# Patient Record
Sex: Female | Born: 1945
Health system: Southern US, Community
[De-identification: ages and names within clinical notes are randomized; demographics above are authoritative.]

## PROBLEM LIST (undated history)

## (undated) DIAGNOSIS — F419 Anxiety disorder, unspecified: Secondary | ICD-10-CM

## (undated) DIAGNOSIS — E785 Hyperlipidemia, unspecified: Secondary | ICD-10-CM

## (undated) DIAGNOSIS — I219 Acute myocardial infarction, unspecified: Secondary | ICD-10-CM

## (undated) DIAGNOSIS — I2510 Atherosclerotic heart disease of native coronary artery without angina pectoris: Secondary | ICD-10-CM

## (undated) DIAGNOSIS — Z923 Personal history of irradiation: Secondary | ICD-10-CM

## (undated) DIAGNOSIS — M1990 Unspecified osteoarthritis, unspecified site: Secondary | ICD-10-CM

## (undated) DIAGNOSIS — G2581 Restless legs syndrome: Secondary | ICD-10-CM

## (undated) DIAGNOSIS — J42 Unspecified chronic bronchitis: Secondary | ICD-10-CM

## (undated) DIAGNOSIS — I10 Essential (primary) hypertension: Secondary | ICD-10-CM

## (undated) DIAGNOSIS — C801 Malignant (primary) neoplasm, unspecified: Secondary | ICD-10-CM

## (undated) DIAGNOSIS — C50919 Malignant neoplasm of unspecified site of unspecified female breast: Secondary | ICD-10-CM

## (undated) DIAGNOSIS — I639 Cerebral infarction, unspecified: Secondary | ICD-10-CM

## (undated) HISTORY — PX: TUBAL LIGATION: SHX77

## (undated) HISTORY — DX: Atherosclerotic heart disease of native coronary artery without angina pectoris: I25.10

## (undated) HISTORY — PX: SMALL INTESTINE SURGERY: SHX150

## (undated) HISTORY — PX: UPPER GASTROINTESTINAL ENDOSCOPY: SHX188

## (undated) HISTORY — DX: Hyperlipidemia, unspecified: E78.5

## (undated) HISTORY — DX: Malignant (primary) neoplasm, unspecified: C80.1

## (undated) HISTORY — DX: Unspecified chronic bronchitis: J42

## (undated) HISTORY — DX: Essential (primary) hypertension: I10

## (undated) HISTORY — DX: Acute myocardial infarction, unspecified: I21.9

## (undated) HISTORY — DX: Cerebral infarction, unspecified: I63.9

## (undated) HISTORY — DX: Unspecified osteoarthritis, unspecified site: M19.90

---

## 1983-07-30 HISTORY — PX: ABDOMINAL HYSTERECTOMY: SHX81

## 1998-07-29 DIAGNOSIS — C801 Malignant (primary) neoplasm, unspecified: Secondary | ICD-10-CM

## 1998-07-29 HISTORY — PX: BREAST LUMPECTOMY: SHX2

## 1998-07-29 HISTORY — DX: Malignant (primary) neoplasm, unspecified: C80.1

## 1999-07-30 DIAGNOSIS — I219 Acute myocardial infarction, unspecified: Secondary | ICD-10-CM

## 1999-07-30 HISTORY — PX: BREAST SURGERY: SHX581

## 1999-07-30 HISTORY — DX: Acute myocardial infarction, unspecified: I21.9

## 2002-07-29 DIAGNOSIS — I639 Cerebral infarction, unspecified: Secondary | ICD-10-CM

## 2002-07-29 HISTORY — DX: Cerebral infarction, unspecified: I63.9

## 2007-04-07 ENCOUNTER — Emergency Department (HOSPITAL_COMMUNITY): Admission: EM | Admit: 2007-04-07 | Discharge: 2007-04-07 | Payer: Self-pay | Admitting: Emergency Medicine

## 2007-04-08 ENCOUNTER — Ambulatory Visit: Payer: Self-pay | Admitting: *Deleted

## 2007-04-09 ENCOUNTER — Emergency Department (HOSPITAL_COMMUNITY): Admission: EM | Admit: 2007-04-09 | Discharge: 2007-04-09 | Payer: Self-pay | Admitting: Emergency Medicine

## 2007-04-10 ENCOUNTER — Encounter (INDEPENDENT_AMBULATORY_CARE_PROVIDER_SITE_OTHER): Payer: Self-pay | Admitting: Nurse Practitioner

## 2007-04-10 ENCOUNTER — Ambulatory Visit: Payer: Self-pay | Admitting: Internal Medicine

## 2007-04-10 LAB — CONVERTED CEMR LAB
HDL: 60 mg/dL (ref >39)
LDL Cholesterol: 175 mg/dL — ABNORMAL HIGH (ref 0–99)
Total CHOL/HDL Ratio: 4.3
Triglycerides: 123 mg/dL (ref <150)

## 2007-04-27 ENCOUNTER — Ambulatory Visit: Payer: Self-pay | Admitting: Internal Medicine

## 2007-05-26 ENCOUNTER — Ambulatory Visit: Payer: Self-pay | Admitting: Internal Medicine

## 2007-05-26 LAB — CONVERTED CEMR LAB
HDL: 46 mg/dL (ref >39)
LDL Cholesterol: 85 mg/dL (ref 0–99)
Total CHOL/HDL Ratio: 3.2

## 2007-06-09 ENCOUNTER — Ambulatory Visit: Payer: Self-pay | Admitting: Internal Medicine

## 2007-06-11 ENCOUNTER — Encounter: Admission: RE | Admit: 2007-06-11 | Discharge: 2007-06-11 | Payer: Self-pay | Admitting: Family Medicine

## 2007-08-31 ENCOUNTER — Ambulatory Visit: Payer: Self-pay | Admitting: Internal Medicine

## 2007-10-29 DIAGNOSIS — I890 Lymphedema, not elsewhere classified: Secondary | ICD-10-CM | POA: Insufficient documentation

## 2008-12-20 ENCOUNTER — Encounter: Admission: RE | Admit: 2008-12-20 | Discharge: 2008-12-20 | Payer: Self-pay | Admitting: Family Medicine

## 2009-08-24 DIAGNOSIS — J42 Unspecified chronic bronchitis: Secondary | ICD-10-CM | POA: Insufficient documentation

## 2010-02-20 DIAGNOSIS — E559 Vitamin D deficiency, unspecified: Secondary | ICD-10-CM | POA: Insufficient documentation

## 2010-02-26 ENCOUNTER — Encounter: Admission: RE | Admit: 2010-02-26 | Discharge: 2010-02-26 | Payer: Self-pay | Admitting: Family Medicine

## 2010-05-01 DIAGNOSIS — M899 Disorder of bone, unspecified: Secondary | ICD-10-CM | POA: Insufficient documentation

## 2010-05-01 DIAGNOSIS — F411 Generalized anxiety disorder: Secondary | ICD-10-CM | POA: Insufficient documentation

## 2010-08-03 ENCOUNTER — Emergency Department (HOSPITAL_COMMUNITY)
Admission: EM | Admit: 2010-08-03 | Discharge: 2010-08-03 | Payer: Self-pay | Source: Home / Self Care | Admitting: Emergency Medicine

## 2010-10-19 DIAGNOSIS — IMO0002 Reserved for concepts with insufficient information to code with codable children: Secondary | ICD-10-CM | POA: Insufficient documentation

## 2011-01-21 ENCOUNTER — Other Ambulatory Visit: Payer: Self-pay | Admitting: Family Medicine

## 2011-01-22 ENCOUNTER — Ambulatory Visit
Admission: RE | Admit: 2011-01-22 | Discharge: 2011-01-22 | Disposition: A | Discharge status: Home / Self Care | Payer: Medicare Other | Source: Ambulatory Visit | Attending: Family Medicine | Admitting: Family Medicine

## 2011-01-22 ENCOUNTER — Other Ambulatory Visit: Payer: Self-pay | Admitting: Family Medicine

## 2011-03-01 ENCOUNTER — Other Ambulatory Visit: Payer: Self-pay | Admitting: Family Medicine

## 2011-03-01 DIAGNOSIS — Z780 Asymptomatic menopausal state: Secondary | ICD-10-CM

## 2011-03-01 DIAGNOSIS — Z1231 Encounter for screening mammogram for malignant neoplasm of breast: Secondary | ICD-10-CM

## 2011-04-04 ENCOUNTER — Ambulatory Visit
Admission: RE | Admit: 2011-04-04 | Discharge: 2011-04-04 | Disposition: A | Discharge status: Home / Self Care | Payer: Medicare Other | Source: Ambulatory Visit | Attending: Family Medicine | Admitting: Family Medicine

## 2011-04-04 DIAGNOSIS — Z1231 Encounter for screening mammogram for malignant neoplasm of breast: Secondary | ICD-10-CM

## 2011-04-04 DIAGNOSIS — Z8739 Personal history of other diseases of the musculoskeletal system and connective tissue: Secondary | ICD-10-CM

## 2011-04-04 DIAGNOSIS — Z780 Asymptomatic menopausal state: Secondary | ICD-10-CM

## 2011-04-22 ENCOUNTER — Emergency Department (HOSPITAL_COMMUNITY)
Admission: EM | Admit: 2011-04-22 | Discharge: 2011-04-22 | Disposition: A | Discharge status: Home / Self Care | Payer: Medicare Other | Attending: Emergency Medicine | Admitting: Emergency Medicine

## 2011-04-22 DIAGNOSIS — I10 Essential (primary) hypertension: Secondary | ICD-10-CM | POA: Insufficient documentation

## 2011-04-22 DIAGNOSIS — R040 Epistaxis: Secondary | ICD-10-CM | POA: Insufficient documentation

## 2011-04-22 DIAGNOSIS — Z853 Personal history of malignant neoplasm of breast: Secondary | ICD-10-CM | POA: Insufficient documentation

## 2011-05-10 LAB — I-STAT 8, (EC8 V) (CONVERTED LAB)
Bicarbonate: 28.3 — ABNORMAL HIGH
Glucose, Bld: 96
Operator id: 239701
pH, Ven: 7.464 — ABNORMAL HIGH

## 2011-05-10 LAB — POCT I-STAT CREATININE
Creatinine, Ser: 0.9
Operator id: 239701

## 2011-07-05 DIAGNOSIS — T7840XA Allergy, unspecified, initial encounter: Secondary | ICD-10-CM | POA: Insufficient documentation

## 2011-07-24 DIAGNOSIS — G459 Transient cerebral ischemic attack, unspecified: Secondary | ICD-10-CM | POA: Insufficient documentation

## 2011-07-30 LAB — HM COLONOSCOPY: HM Colonoscopy: NORMAL

## 2011-10-10 DIAGNOSIS — M25559 Pain in unspecified hip: Secondary | ICD-10-CM | POA: Insufficient documentation

## 2011-12-02 ENCOUNTER — Other Ambulatory Visit (HOSPITAL_COMMUNITY): Payer: Self-pay | Admitting: Family Medicine

## 2011-12-02 DIAGNOSIS — Z853 Personal history of malignant neoplasm of breast: Secondary | ICD-10-CM

## 2011-12-05 ENCOUNTER — Encounter (HOSPITAL_COMMUNITY)
Admission: RE | Admit: 2011-12-05 | Discharge: 2011-12-05 | Disposition: A | Discharge status: Home / Self Care | Payer: Medicare Other | Source: Ambulatory Visit | Attending: Family Medicine | Admitting: Family Medicine

## 2011-12-05 DIAGNOSIS — Z853 Personal history of malignant neoplasm of breast: Secondary | ICD-10-CM | POA: Insufficient documentation

## 2011-12-05 DIAGNOSIS — M25559 Pain in unspecified hip: Secondary | ICD-10-CM | POA: Insufficient documentation

## 2011-12-05 MED ORDER — TECHNETIUM TC 99M MEDRONATE IV KIT
25.0000 | PACK | Freq: Once | INTRAVENOUS | Status: AC | PRN
  Administered 2011-12-05: 25 via INTRAVENOUS

## 2012-02-03 ENCOUNTER — Encounter: Payer: Self-pay | Admitting: Family Medicine

## 2012-02-03 ENCOUNTER — Ambulatory Visit (INDEPENDENT_AMBULATORY_CARE_PROVIDER_SITE_OTHER): Payer: Medicare Other | Admitting: Family Medicine

## 2012-02-03 VITALS — BP 130/70 | HR 85 | Temp 98.6°F | Ht 60.75 in | Wt 193.6 lb

## 2012-02-03 DIAGNOSIS — C50919 Malignant neoplasm of unspecified site of unspecified female breast: Secondary | ICD-10-CM

## 2012-02-03 DIAGNOSIS — R1032 Left lower quadrant pain: Secondary | ICD-10-CM | POA: Insufficient documentation

## 2012-02-03 DIAGNOSIS — I10 Essential (primary) hypertension: Secondary | ICD-10-CM

## 2012-02-03 DIAGNOSIS — H8110 Benign paroxysmal vertigo, unspecified ear: Secondary | ICD-10-CM

## 2012-02-03 MED ORDER — NAPROXEN 500 MG PO TABS: 500.0000 mg | ORAL_TABLET | Freq: Two times a day (BID) | ORAL | Status: AC

## 2012-02-03 MED ORDER — MONTELUKAST SODIUM 10 MG PO TABS: 10.0000 mg | ORAL_TABLET | Freq: Every day | ORAL | Status: DC

## 2012-02-03 MED ORDER — MECLIZINE HCL 50 MG PO TABS: 50.0000 mg | ORAL_TABLET | Freq: Three times a day (TID) | ORAL | Status: AC | PRN

## 2012-02-03 NOTE — Patient Instructions (Addendum)
Schedule your complete physical at your convenience Start the Meclizine as needed for dizziness Change positions slowly Increase your fluid intake HOLD the Celebrex while taking the Naproxen.  Take the Naproxen (500mg ) twice daily- w/ food- for 10 days. We'll call you with your ortho appt Continue the Singulair and Claritin daily Add Coricidin HBP to decrease the congestion Call with any questions or concerns Hang in there! Welcome!  We're glad to have you!

## 2012-02-03 NOTE — Progress Notes (Signed)
  Subjective:    Patient ID: Tracy [name], female    DOB: 1946/04/12, 66 y.o.   MRN: [id number]  HPI New to establish.  Previous MD- Duanne Guess.  HTN- chronic problem, on Benicar 20mg .  No CP, SOB, visual changes, edema.  Breast Cancer- dx'd 2001, s/p lumpectomy.  Getting yearly mammograms.  No longer following w/ surgeon or oncologist.  Was following yearly until moved from [place] in 2005.  Dizziness- hx of vertigo.  Feels this is 'different.  i don't have the spinning'.  Will wake her from sleeping when she turns over.  sxs started Friday w/ getting out of bed.  Worse when changing position- bending, turning, standing.  No nausea.  No current HA.  Having L ear pain and R ear fullness.  L groin pain- sxs started in Jan.  'right there- there's a spot'.  When crossing her L leg over right in 'tailor' motion she will get sharp shooting pain.  Has had xray, MRI, CT scan of abd/pelvis.  Pt has hx of similar and was told it was tendonitis.  Was refused meds for tendonitis and told to do exercises.  'i think she got real mad at me'.  Previously on Celebrex- has resumed 100mg  daily.   Review of Systems For ROS see HPI     Objective:   Physical Exam  Vitals reviewed. Constitutional: She is oriented to person, place, and time. She appears well-developed and well-nourished. No distress.  HENT:  Head: Normocephalic and atraumatic.  Right Ear: Tympanic membrane normal.  Left Ear: Tympanic membrane normal.  Nose: Mucosal edema and rhinorrhea present. Right sinus exhibits no maxillary sinus tenderness and no frontal sinus tenderness. Left sinus exhibits no maxillary sinus tenderness and no frontal sinus tenderness.  Mouth/Throat: Mucous membranes are normal. Posterior oropharyngeal erythema (w/ PND) present.  Eyes: Conjunctivae and EOM are normal. Pupils are equal, round, and reactive to light.  Neck: Normal range of motion. Neck supple. No thyromegaly present.  Cardiovascular: Normal rate, regular rhythm,  normal heart sounds and intact distal pulses.   No murmur heard. Pulmonary/Chest: Effort normal and breath sounds normal. No respiratory distress. She has no wheezes. She has no rales.  Abdominal: Soft. She exhibits no distension. There is no tenderness.  Musculoskeletal: She exhibits no edema.       Pain w/ external rotation and abduction of L hip over origin of sartorious.  Lymphadenopathy:    She has no cervical adenopathy.  Neurological: She is alert and oriented to person, place, and time. She has normal reflexes. No cranial nerve deficit. Coordination normal.       Gait normal  Skin: Skin is warm and dry.  Psychiatric: She has a normal mood and affect. Her behavior is normal.          Assessment & Plan:

## 2012-02-18 NOTE — Assessment & Plan Note (Signed)
New to provider.  Pt getting yearly mammos.  No longer following w/ surgeon.

## 2012-02-18 NOTE — Assessment & Plan Note (Signed)
Chronic problem.  Fair control.  Asymptomatic.  No changes. 

## 2012-02-18 NOTE — Assessment & Plan Note (Signed)
New.  Pt w/ hx of similar.  Start Meclizine prn.  Increase fluid intake.  Continue seasonal allergy meds to decrease sinus congestion.  Add Coricidin HBP prn.  Reviewed supportive care and red flags that should prompt return.  Pt expressed understanding and is in agreement w/ plan.

## 2012-02-18 NOTE — Assessment & Plan Note (Signed)
New.  Pt's sxs consistent w/ tendonitis.  Will start scheduled NSAIDs and refer to ortho for complete evaluation and tx.  Pt expressed understanding and is in agreement w/ plan.

## 2012-02-19 ENCOUNTER — Ambulatory Visit (INDEPENDENT_AMBULATORY_CARE_PROVIDER_SITE_OTHER): Payer: Medicare Other | Admitting: Family Medicine

## 2012-02-19 ENCOUNTER — Telehealth: Payer: Self-pay | Admitting: *Deleted

## 2012-02-19 ENCOUNTER — Encounter: Payer: Self-pay | Admitting: Family Medicine

## 2012-02-19 VITALS — BP 135/86 | HR 74 | Temp 99.1°F | Ht 60.0 in | Wt 193.8 lb

## 2012-02-19 DIAGNOSIS — H8110 Benign paroxysmal vertigo, unspecified ear: Secondary | ICD-10-CM

## 2012-02-19 DIAGNOSIS — I10 Essential (primary) hypertension: Secondary | ICD-10-CM

## 2012-02-19 DIAGNOSIS — J019 Acute sinusitis, unspecified: Secondary | ICD-10-CM | POA: Insufficient documentation

## 2012-02-19 MED ORDER — SULFAMETHOXAZOLE-TMP DS 800-160 MG PO TABS: 1.0000 | ORAL_TABLET | Freq: Two times a day (BID) | ORAL | Status: DC

## 2012-02-19 MED ORDER — MOMETASONE FUROATE 50 MCG/ACT NA SUSP: 2.0000 | Freq: Every day | NASAL | Status: DC

## 2012-02-19 NOTE — Progress Notes (Signed)
  Subjective:    Patient ID: Tracy [name], female    DOB: Feb 10, 1946, 66 y.o.   MRN: [id number]  HPI Elevated BP- went to get injxn in hip yesterday at ortho and BP was 167/84.  Today BP is closer to norm.  Denies CP, SOB, HAs, visual changes.  Dizziness- pt reports dizziness was improving from last visit but on Saturday bent down and 'got real, real dizzy and it started all over again'.  sxs are worse w/ lying down and turning.  Used nasonex on Saturday w/ some relief but is now out of med.  Having some eye watering, sinus HA.  Denies facial pain/pressure but pain w/ bending forward.   Review of Systems For ROS see HPI     Objective:   Physical Exam  Vitals reviewed. Constitutional: She is oriented to person, place, and time. She appears well-developed and well-nourished. No distress.  HENT:  Head: Normocephalic and atraumatic.  Right Ear: Tympanic membrane normal.  Left Ear: Tympanic membrane normal.  Nose: Mucosal edema and rhinorrhea present. Right sinus exhibits maxillary sinus tenderness and frontal sinus tenderness. Left sinus exhibits maxillary sinus tenderness and frontal sinus tenderness.  Mouth/Throat: Uvula is midline and mucous membranes are normal. Posterior oropharyngeal erythema present. No oropharyngeal exudate.  Eyes: Conjunctivae and EOM are normal. Pupils are equal, round, and reactive to light.  Neck: Normal range of motion. Neck supple.  Cardiovascular: Normal rate, regular rhythm and normal heart sounds.   Pulmonary/Chest: Effort normal and breath sounds normal. No respiratory distress. She has no wheezes.  Lymphadenopathy:    She has no cervical adenopathy.  Neurological: She is alert and oriented to person, place, and time. She has normal reflexes. No cranial nerve deficit. Coordination normal.       + dizziness w/ position change or turning of head  Psychiatric: She has a normal mood and affect. Her behavior is normal. Thought content normal.            Assessment & Plan:

## 2012-02-19 NOTE — Telephone Encounter (Signed)
Pt states she was seen today and Rx for nasonex was suppose to be sent in. Pt went to pharmacy and no Rx there.Please advise

## 2012-02-19 NOTE — Patient Instructions (Addendum)
Follow up in 1 month to recheck BP Continue the Benicar 20mg  daily You have a sinus infection- start the Bactrim twice daily, w/ food Restart the Nasonex- 2 sprays each nostril twice daily Continue the Claritin and Singulair daily Drink plenty of fluids!!! REST! Use the Meclizine as needed for the dizziness- this is worse b/c of the sinus infection Call with any questions or concerns Hang in there!

## 2012-02-19 NOTE — Telephone Encounter (Signed)
Med just sent.  Please apologize

## 2012-02-25 ENCOUNTER — Encounter: Payer: Self-pay | Admitting: Family Medicine

## 2012-02-25 ENCOUNTER — Ambulatory Visit (INDEPENDENT_AMBULATORY_CARE_PROVIDER_SITE_OTHER): Payer: Medicare Other | Admitting: Family Medicine

## 2012-02-25 VITALS — BP 132/82 | HR 80 | Temp 99.3°F | Ht 60.25 in | Wt 190.2 lb

## 2012-02-25 DIAGNOSIS — H8110 Benign paroxysmal vertigo, unspecified ear: Secondary | ICD-10-CM

## 2012-02-25 DIAGNOSIS — R079 Chest pain, unspecified: Secondary | ICD-10-CM | POA: Insufficient documentation

## 2012-02-25 DIAGNOSIS — R252 Cramp and spasm: Secondary | ICD-10-CM | POA: Insufficient documentation

## 2012-02-25 LAB — BASIC METABOLIC PANEL
BUN: 22 mg/dL (ref 6–23)
Chloride: 102 mEq/L (ref 96–112)
GFR: 62.23 mL/min (ref >60.00)
Glucose, Bld: 91 mg/dL (ref 70–99)
Potassium: 5.2 mEq/L — ABNORMAL HIGH (ref 3.5–5.1)
Sodium: 135 mEq/L (ref 135–145)

## 2012-02-25 LAB — CBC WITH DIFFERENTIAL/PLATELET
Eosinophils Relative: 1.2 % (ref 0.0–5.0)
HCT: 38 % (ref 36.0–46.0)
Hemoglobin: 12.6 g/dL (ref 12.0–15.0)
Lymphs Abs: 3.4 10*3/uL (ref 0.7–4.0)
MCV: 96.2 fl (ref 78.0–100.0)
Monocytes Absolute: 0.5 10*3/uL (ref 0.1–1.0)
Monocytes Relative: 4.8 % (ref 3.0–12.0)
Neutro Abs: 5.6 10*3/uL (ref 1.4–7.7)
Platelets: 293 10*3/uL (ref 150.0–400.0)
RDW: 13.7 % (ref 11.5–14.6)
WBC: 9.6 10*3/uL (ref 4.5–10.5)

## 2012-02-25 MED ORDER — CYCLOBENZAPRINE HCL 10 MG PO TABS: 10.0000 mg | ORAL_TABLET | Freq: Three times a day (TID) | ORAL | Status: AC | PRN

## 2012-02-25 NOTE — Assessment & Plan Note (Signed)
Unchanged.  Pt reports meclizine worsened sxs.  Pt continues to only have sxs w/ lying down and turning head.  Still suspect BPV but will refer to neuro for complete evaluation.  Pt given handout on modified eppley to try and desensitize herself.  Reviewed supportive care and red flags that should prompt return.  Pt expressed understanding and is in agreement w/ plan.

## 2012-02-25 NOTE — Patient Instructions (Addendum)
STOP the Bactrim- you didn't have the cramps until you started this medication We'll call you with your neuro appt for the dizziness Try the exercises for the dizziness We'll notify you of your lab results Use the flexeril if you again develop severe muscle spasms If you have severe pain or chest pain- please go to the ER Call with any questions or concerns Hang in there!

## 2012-02-25 NOTE — Assessment & Plan Note (Signed)
New.  Pt's sxs sound musculoskeletal.  ekg WNL.  Currently asymptomatic.  Check labs to r/o metabolic cause.  Reviewed supportive care and red flags that should prompt return.  Pt expressed understanding and is in agreement w/ plan.

## 2012-02-25 NOTE — Progress Notes (Signed)
  Subjective:    Patient ID: Tracy [name], female    DOB: 25-Dec-1945, 66 y.o.   MRN: [id number]  HPI Dizziness- persisting w/ position changes.  Still having dizziness w/ leaning forward, lying down.  HA has improved.  Cramping- occuring under L breast, radiating through to the back.  Reports associated abdominal bloating.  sxs started Sunday night.  Also having cramping in R lower leg- had a [employer].  + nausea, fatigue.  Pain under L breast will cause SOB.  Pain is occuring at rest- not w/ activity.  Reports good water intake.    Review of Systems For ROS see HPI     Objective:   Physical Exam  Vitals reviewed. Constitutional: She is oriented to person, place, and time. She appears well-developed and well-nourished. No distress.  HENT:  Head: Normocephalic and atraumatic.       No TTP over sinuses TMs normal bilaterally  Eyes: Conjunctivae and EOM are normal. Pupils are equal, round, and reactive to light.  Neck: Normal range of motion. Neck supple. No thyromegaly present.  Cardiovascular: Normal rate, regular rhythm, normal heart sounds and intact distal pulses.   No murmur heard. Pulmonary/Chest: Effort normal and breath sounds normal. No respiratory distress. She exhibits no tenderness (no TTP over abd, ribs, or chest wall).  Abdominal: Soft. She exhibits no distension. There is no tenderness.  Musculoskeletal: She exhibits no edema and no tenderness (no TTP over LEs, back, or anterior ribs under breast).  Lymphadenopathy:    She has no cervical adenopathy.  Neurological: She is alert and oriented to person, place, and time. She has normal reflexes. No cranial nerve deficit. Coordination normal.  Skin: Skin is warm and dry.  Psychiatric: She has a normal mood and affect. Her behavior is normal.          Assessment & Plan:

## 2012-02-25 NOTE — Assessment & Plan Note (Signed)
New.  Currently asymptomatic.  Check labs to r/o metabolic abnormality like hypokalemia.  sxs started after start Bactrim.  D/c bactrim, increase fluid intake, flexeril prn for muscle spasms.

## 2012-02-26 ENCOUNTER — Encounter: Payer: Self-pay | Admitting: *Deleted

## 2012-03-03 NOTE — Assessment & Plan Note (Signed)
New.  Pt's sxs and PE consistent w/ infxn.  Start abx.  Continue allergy meds including nasal steroid.  This is likely worsening pt's vertigo.  Reviewed supportive care and red flags that should prompt return.  Pt expressed understanding and is in agreement w/ plan.

## 2012-03-03 NOTE — Assessment & Plan Note (Signed)
Deteriorated.  Pt's BP adequate today but at Ortho office was elevated.  This could have been due to anxiety over injxn or pain.  Will not adjust meds at this time but will continue to follow closely.

## 2012-03-03 NOTE — Assessment & Plan Note (Signed)
Deteriorated.  Pt's sxs did not improve w/ meclizine- in fact, she thinks this made it worse.  Will refer to neuro for complete evaluation and tx.  Pt expressed understanding and is in agreement w/ plan.

## 2012-03-05 ENCOUNTER — Other Ambulatory Visit: Payer: Self-pay | Admitting: Diagnostic Neuroimaging

## 2012-03-05 DIAGNOSIS — R42 Dizziness and giddiness: Secondary | ICD-10-CM

## 2012-03-09 ENCOUNTER — Other Ambulatory Visit (HOSPITAL_COMMUNITY): Payer: Self-pay | Admitting: Diagnostic Neuroimaging

## 2012-03-09 ENCOUNTER — Ambulatory Visit (HOSPITAL_COMMUNITY): Payer: Medicare Other | Attending: Internal Medicine | Admitting: Radiology

## 2012-03-09 ENCOUNTER — Other Ambulatory Visit (HOSPITAL_COMMUNITY): Payer: Medicare Other

## 2012-03-09 ENCOUNTER — Other Ambulatory Visit: Payer: Self-pay | Admitting: *Deleted

## 2012-03-09 DIAGNOSIS — R079 Chest pain, unspecified: Secondary | ICD-10-CM | POA: Insufficient documentation

## 2012-03-09 DIAGNOSIS — I369 Nonrheumatic tricuspid valve disorder, unspecified: Secondary | ICD-10-CM

## 2012-03-09 DIAGNOSIS — Z87891 Personal history of nicotine dependence: Secondary | ICD-10-CM | POA: Insufficient documentation

## 2012-03-09 DIAGNOSIS — R5381 Other malaise: Secondary | ICD-10-CM | POA: Insufficient documentation

## 2012-03-09 DIAGNOSIS — Z923 Personal history of irradiation: Secondary | ICD-10-CM | POA: Insufficient documentation

## 2012-03-09 DIAGNOSIS — I10 Essential (primary) hypertension: Secondary | ICD-10-CM | POA: Insufficient documentation

## 2012-03-09 DIAGNOSIS — C50919 Malignant neoplasm of unspecified site of unspecified female breast: Secondary | ICD-10-CM | POA: Insufficient documentation

## 2012-03-09 DIAGNOSIS — Z8249 Family history of ischemic heart disease and other diseases of the circulatory system: Secondary | ICD-10-CM | POA: Insufficient documentation

## 2012-03-09 DIAGNOSIS — R42 Dizziness and giddiness: Secondary | ICD-10-CM

## 2012-03-09 DIAGNOSIS — Z9221 Personal history of antineoplastic chemotherapy: Secondary | ICD-10-CM | POA: Insufficient documentation

## 2012-03-09 MED ORDER — OLMESARTAN MEDOXOMIL 20 MG PO TABS: 20.0000 mg | ORAL_TABLET | Freq: Every day | ORAL | Status: DC

## 2012-03-09 NOTE — Telephone Encounter (Signed)
Requesting refill on her benicar 20mg  sent to [facility] on [address], sent via escribe

## 2012-03-09 NOTE — Progress Notes (Signed)
Echocardiogram performed.  

## 2012-03-11 ENCOUNTER — Ambulatory Visit
Admission: RE | Admit: 2012-03-11 | Discharge: 2012-03-11 | Disposition: A | Discharge status: Home / Self Care | Payer: Medicare Other | Source: Ambulatory Visit | Attending: Diagnostic Neuroimaging | Admitting: Diagnostic Neuroimaging

## 2012-03-11 DIAGNOSIS — R42 Dizziness and giddiness: Secondary | ICD-10-CM

## 2012-03-13 ENCOUNTER — Encounter (HOSPITAL_COMMUNITY): Payer: Self-pay | Admitting: Diagnostic Neuroimaging

## 2012-03-13 ENCOUNTER — Ambulatory Visit
Admission: RE | Admit: 2012-03-13 | Discharge: 2012-03-13 | Disposition: A | Discharge status: Home / Self Care | Payer: Medicare Other | Source: Ambulatory Visit | Attending: Diagnostic Neuroimaging | Admitting: Diagnostic Neuroimaging

## 2012-03-13 MED ORDER — GADOBENATE DIMEGLUMINE 529 MG/ML IV SOLN
17.0000 mL | Freq: Once | INTRAVENOUS | Status: AC | PRN
  Administered 2012-03-13: 17 mL via INTRAVENOUS

## 2012-03-23 ENCOUNTER — Other Ambulatory Visit: Payer: Self-pay | Admitting: *Deleted

## 2012-03-23 MED ORDER — OMEPRAZOLE 40 MG PO CPDR: 40.0000 mg | DELAYED_RELEASE_CAPSULE | Freq: Every day | ORAL | Status: DC

## 2012-03-23 NOTE — Telephone Encounter (Signed)
rx sent to pharmacy by e-script  

## 2012-03-23 NOTE — Telephone Encounter (Signed)
Please advise on refill.

## 2012-03-23 NOTE — Telephone Encounter (Signed)
Ok for #30, 6 refills 

## 2012-04-02 ENCOUNTER — Encounter: Payer: Self-pay | Admitting: *Deleted

## 2012-04-02 ENCOUNTER — Encounter: Payer: Self-pay | Admitting: Family Medicine

## 2012-04-02 ENCOUNTER — Ambulatory Visit (INDEPENDENT_AMBULATORY_CARE_PROVIDER_SITE_OTHER): Payer: Medicare Other | Admitting: Family Medicine

## 2012-04-02 VITALS — BP 129/80 | HR 83 | Temp 98.8°F | Ht 60.75 in | Wt 193.4 lb

## 2012-04-02 DIAGNOSIS — I10 Essential (primary) hypertension: Secondary | ICD-10-CM

## 2012-04-02 DIAGNOSIS — G4700 Insomnia, unspecified: Secondary | ICD-10-CM | POA: Insufficient documentation

## 2012-04-02 DIAGNOSIS — Z0000 Encounter for general adult medical examination without abnormal findings: Secondary | ICD-10-CM

## 2012-04-02 LAB — LIPID PANEL
HDL: 68.7 mg/dL (ref >39.00)
VLDL: 17.8 mg/dL (ref 0.0–40.0)

## 2012-04-02 LAB — LDL CHOLESTEROL, DIRECT: Direct LDL: 130.4 mg/dL

## 2012-04-02 LAB — HEPATIC FUNCTION PANEL
Albumin: 3.3 g/dL — ABNORMAL LOW (ref 3.5–5.2)
Alkaline Phosphatase: 70 U/L (ref 39–117)
Bilirubin, Direct: 0 mg/dL (ref 0.0–0.3)
Total Bilirubin: 0.4 mg/dL (ref 0.3–1.2)

## 2012-04-02 MED ORDER — TRAZODONE HCL 50 MG PO TABS: 25.0000 mg | ORAL_TABLET | Freq: Every evening | ORAL | Status: DC | PRN

## 2012-04-02 MED ORDER — CYCLOBENZAPRINE HCL 10 MG PO TABS: 10.0000 mg | ORAL_TABLET | Freq: Two times a day (BID) | ORAL | Status: DC | PRN

## 2012-04-02 NOTE — Patient Instructions (Addendum)
Follow up in 6 months to recheck blood pressure- sooner if needed Call and schedule your mammogram We'll notify you of your lab results and make a decision on cholesterol meds Call with any questions or concerns Happy Bel[employer] Birthday!

## 2012-04-02 NOTE — Progress Notes (Signed)
  Subjective:    Patient ID: Tracy [name], female    DOB: 12-09-1945, 66 y.o.   MRN: [id number]  HPI Here today for CPE.  Risk Factors: HTN- chronic problem, adequate control on Benicar.  No CP, SOB, HAs, visual changes, edema. Insomnia- trouble both falling and staying asleep.  Has taken Ambien in the past but 'i was allergic to it'.  Can take up to 2 hrs to fall asleep and then will wake after 2-3 hrs. Physical Activity: walking Fall Risk: low risk, very steady on feet Depression: no current sxs Hearing: normal to conversational tones, whispered voice ADL's: independent Cognitive: normal linear thought process, memory and attention intact Home Safety: safe at home, lives alone, has daughter local Height, Weight, BMI, Visual Acuity: see vitals, vision corrected to 20/20 w/ glasses Counseling: UTD on colonoscopy, DEXA.  Due for mammo.  No need for pap due to TAH Labs Ordered: See A&P Care Plan: See A&P    Review of Systems Patient reports no vision/ hearing changes, adenopathy,fever, weight change,  persistant/recurrent hoarseness , swallowing issues, chest pain, palpitations, edema, persistant/recurrent cough, hemoptysis, dyspnea (rest/exertional/paroxysmal nocturnal), gastrointestinal bleeding (melena, rectal bleeding), abdominal pain, significant heartburn, bowel changes, GU symptoms (dysuria, hematuria, incontinence), Gyn symptoms (abnormal  bleeding, pain),  syncope, focal weakness, memory loss, numbness & tingling, skin/hair/nail changes, abnormal bruising or bleeding, anxiety, or depression.     Objective:   Physical Exam  General Appearance:    Alert, cooperative, no distress, appears stated age  Head:    Normocephalic, without obvious abnormality, atraumatic  Eyes:    PERRL, conjunctiva/corneas clear, EOM's intact, fundi    benign, both eyes  Ears:    Normal TM's and external ear canals, both ears  Nose:   Nares normal, septum midline, mucosa normal, no drainage    or  sinus tenderness  Throat:   Lips, mucosa, and tongue normal; teeth and gums normal  Neck:   Supple, symmetrical, trachea midline, no adenopathy;    Thyroid: no enlargement/tenderness/nodules  Back:     Symmetric, no curvature, ROM normal, no CVA tenderness  Lungs:     Clear to auscultation bilaterally, respirations unlabored  Chest Wall:    No tenderness or deformity   Heart:    Regular rate and rhythm, S1 and S2 normal, no murmur, rub   or gallop  Breast Exam:    No tenderness, masses, or nipple abnormality  Abdomen:     Soft, non-tender, bowel sounds active all four quadrants,    no masses, no organomegaly  Genitalia:    deferred  Rectal:    Extremities:   Extremities normal, atraumatic, no cyanosis or edema  Pulses:   2+ and symmetric all extremities  Skin:   Skin color, texture, turgor normal, no rashes or lesions  Lymph nodes:   Cervical, supraclavicular, and axillary nodes normal  Neurologic:   CNII-XII intact, normal strength, sensation and reflexes    throughout          Assessment & Plan:

## 2012-04-09 ENCOUNTER — Other Ambulatory Visit: Payer: Self-pay | Admitting: Family Medicine

## 2012-04-09 DIAGNOSIS — Z1231 Encounter for screening mammogram for malignant neoplasm of breast: Secondary | ICD-10-CM

## 2012-04-20 ENCOUNTER — Ambulatory Visit: Payer: Medicare Other

## 2012-04-21 ENCOUNTER — Encounter: Payer: Self-pay | Admitting: Family Medicine

## 2012-04-21 NOTE — Assessment & Plan Note (Signed)
New to provider.  Chronic for pt.  Intolerant/allergic to [employer].  Has not been on Trazodone that she can remember.  Will start low dose and follow.

## 2012-04-21 NOTE — Assessment & Plan Note (Signed)
Pt's PE WNL.  UTD on health maintenance w/ exception of mammo- pt to schedule.  Check labs.  Anticipatory guidance provided. 

## 2012-04-21 NOTE — Assessment & Plan Note (Signed)
Chronic problem.  Adequate control.  Asymptomatic.  No changes. 

## 2012-04-27 ENCOUNTER — Ambulatory Visit
Admission: RE | Admit: 2012-04-27 | Discharge: 2012-04-27 | Disposition: A | Discharge status: Home / Self Care | Payer: Medicare Other | Source: Ambulatory Visit | Attending: Family Medicine | Admitting: Family Medicine

## 2012-04-27 DIAGNOSIS — Z1231 Encounter for screening mammogram for malignant neoplasm of breast: Secondary | ICD-10-CM

## 2012-07-07 ENCOUNTER — Telehealth: Payer: Self-pay | Admitting: Family Medicine

## 2012-07-07 DIAGNOSIS — I10 Essential (primary) hypertension: Secondary | ICD-10-CM

## 2012-07-07 MED ORDER — OLMESARTAN MEDOXOMIL 20 MG PO TABS: 20.0000 mg | ORAL_TABLET | Freq: Every day | ORAL | Status: DC

## 2012-07-07 NOTE — Telephone Encounter (Signed)
Rx for Benicar sent to pharmacy, pt notified.

## 2012-07-07 NOTE — Telephone Encounter (Signed)
refill BENICAR 20 MG Take 1 tablet (20 mg total) by mouth daily #30 last fill 11.18.13, last ov wt/labs 9.5.13

## 2012-08-04 ENCOUNTER — Telehealth: Payer: Self-pay | Admitting: Family Medicine

## 2012-08-04 DIAGNOSIS — G4700 Insomnia, unspecified: Secondary | ICD-10-CM

## 2012-08-04 NOTE — Telephone Encounter (Signed)
Ok for #30, 3 refills 

## 2012-08-04 NOTE — Telephone Encounter (Signed)
Refill: Trazadone 50 mg tablets. Take 1/2 to 1 tablets by mouth every night at bedtime as needed for sleep. Qty 30. Last fill 07-08-12

## 2012-08-04 NOTE — Telephone Encounter (Signed)
Please advise on RF request.//AB/CMA 

## 2012-08-05 MED ORDER — TRAZODONE HCL 50 MG PO TABS: 25.0000 mg | ORAL_TABLET | Freq: Every evening | ORAL | Status: DC | PRN

## 2012-08-05 NOTE — Telephone Encounter (Signed)
Rx sent to the pharmacy by e-script.//AB/CMA 

## 2012-11-09 ENCOUNTER — Telehealth: Payer: Self-pay | Admitting: Family Medicine

## 2012-11-09 NOTE — Telephone Encounter (Signed)
OMEPRAZOLE 40 MG CAPSULES QTY:30 LAST REFILL: 3.18.14 TAKE ONE CAPSULE BY MOUTH DAILY

## 2012-11-10 MED ORDER — OMEPRAZOLE 40 MG PO CPDR: 40.0000 mg | DELAYED_RELEASE_CAPSULE | Freq: Every day | ORAL | Status: DC

## 2012-11-10 NOTE — Telephone Encounter (Signed)
Rx sent to the pharmacy by e-script.//AB/CMA 

## 2012-11-24 ENCOUNTER — Other Ambulatory Visit: Payer: Self-pay | Admitting: General Practice

## 2012-11-24 DIAGNOSIS — I10 Essential (primary) hypertension: Secondary | ICD-10-CM

## 2012-11-24 MED ORDER — OLMESARTAN MEDOXOMIL 20 MG PO TABS: 20.0000 mg | ORAL_TABLET | Freq: Every day | ORAL | Status: DC

## 2012-12-09 ENCOUNTER — Encounter: Payer: Self-pay | Admitting: Lab

## 2012-12-10 ENCOUNTER — Ambulatory Visit: Payer: Medicare Other | Admitting: Family Medicine

## 2012-12-14 ENCOUNTER — Other Ambulatory Visit: Payer: Self-pay | Admitting: Family Medicine

## 2012-12-15 NOTE — Telephone Encounter (Signed)
Last refill:11/18/12 Last CPE:04/02/12-pt was to f/u in 6 mos for recheck BP, but cancelled appt.for 12/10/12. Please advise.//AB/CMA

## 2012-12-24 ENCOUNTER — Ambulatory Visit (INDEPENDENT_AMBULATORY_CARE_PROVIDER_SITE_OTHER): Payer: Medicare Other | Admitting: Family Medicine

## 2012-12-24 ENCOUNTER — Encounter: Payer: Self-pay | Admitting: Family Medicine

## 2012-12-24 VITALS — BP 146/60 | HR 88 | Temp 98.2°F | Ht 60.0 in | Wt 194.0 lb

## 2012-12-24 DIAGNOSIS — J4520 Mild intermittent asthma, uncomplicated: Secondary | ICD-10-CM

## 2012-12-24 DIAGNOSIS — J45909 Unspecified asthma, uncomplicated: Secondary | ICD-10-CM | POA: Insufficient documentation

## 2012-12-24 DIAGNOSIS — G4700 Insomnia, unspecified: Secondary | ICD-10-CM

## 2012-12-24 DIAGNOSIS — I10 Essential (primary) hypertension: Secondary | ICD-10-CM

## 2012-12-24 MED ORDER — OLMESARTAN MEDOXOMIL 20 MG PO TABS: 20.0000 mg | ORAL_TABLET | Freq: Every day | ORAL | Status: DC

## 2012-12-24 MED ORDER — MOMETASONE FUROATE 50 MCG/ACT NA SUSP: 2.0000 | Freq: Every day | NASAL | Status: DC

## 2012-12-24 MED ORDER — ALBUTEROL SULFATE HFA 108 (90 BASE) MCG/ACT IN AERS: 2.0000 | INHALATION_SPRAY | RESPIRATORY_TRACT | Status: DC | PRN

## 2012-12-24 MED ORDER — TRAZODONE HCL 100 MG PO TABS: 100.0000 mg | ORAL_TABLET | Freq: Every day | ORAL | Status: DC

## 2012-12-24 MED ORDER — OMEPRAZOLE 40 MG PO CPDR: 40.0000 mg | DELAYED_RELEASE_CAPSULE | Freq: Every day | ORAL | Status: DC

## 2012-12-24 NOTE — Patient Instructions (Addendum)
Schedule your complete physical for after 04/02/13 We'll notify you of your lab results and make any changes if needed Increase the Trazodone to 100mg  nightly (2 of what you have at home, 1 of the new script) If your BP is consistently higher than 150/90- please call Start Claritin or Zyrtec daily for the allergies and use the inhaler as needed Hang in there!

## 2012-12-24 NOTE — Assessment & Plan Note (Signed)
New.  Pt's sxs are well controlled until times of seasonal allergy flares.  No wheezing on PE today.  Refill of Proair provided.

## 2012-12-24 NOTE — Assessment & Plan Note (Signed)
Chronic problem.  BP slightly elevated today but not high enough to warrant med change.  Discussed that recent studies have shown that older adults can have slightly higher BP readings.  Pt given option to increase Benicar to 40mg  vs watchful waiting.  Pt wants watchful waiting.

## 2012-12-24 NOTE — Progress Notes (Signed)
  Subjective:    Patient ID: Tracy [name], female    DOB: [birth year], 67 y.o.   MRN: [id number]  HPI HTN- chronic problem, on Benicar 20mg .  Pt had home health [facility] visit 1 month ago and was told BP was elevated.  Denies CP, SOB, HAs, visual changes, edema.    Allergic asthma- pt reports when she is having seasonal allergy flares she will develop chest tightness/wheezing.  Will take ProAir as needed.  Not currently on OTC antihistamine.  Insomnia- pt taking 50 mg of Trazodone nightly and reports she is waking regularly.  Was previously doing well but this seems to have tapered in its effectiveness.  No longer sleeping through the night like when she started the med.   Review of Systems For ROS see HPI     Objective:   Physical Exam  Vitals reviewed. Constitutional: She appears well-developed and well-nourished. No distress.  HENT:  Head: Normocephalic and atraumatic.  Right Ear: Tympanic membrane normal.  Left Ear: Tympanic membrane normal.  Nose: Mucosal edema and rhinorrhea present. Right sinus exhibits no maxillary sinus tenderness and no frontal sinus tenderness. Left sinus exhibits no maxillary sinus tenderness and no frontal sinus tenderness.  Mouth/Throat: Mucous membranes are normal. Posterior oropharyngeal erythema (w/ PND) present.  Eyes: Conjunctivae and EOM are normal. Pupils are equal, round, and reactive to light.  Neck: Normal range of motion. Neck supple.  Cardiovascular: Normal rate, regular rhythm, normal heart sounds and intact distal pulses.   Pulmonary/Chest: Effort normal and breath sounds normal. No respiratory distress. She has no wheezes. She has no rales.  Musculoskeletal: She exhibits no edema.  Lymphadenopathy:    She has no cervical adenopathy.          Assessment & Plan:

## 2012-12-24 NOTE — Assessment & Plan Note (Signed)
Chronic problem.  Initially improved w/ trazodone but effects have waned.  Increase dose to 100mg .  Will follow.

## 2012-12-25 ENCOUNTER — Other Ambulatory Visit: Payer: Self-pay | Admitting: Family Medicine

## 2012-12-25 NOTE — Telephone Encounter (Signed)
Rx sent to the pharmacy by e-script.//AB/CMA 

## 2013-01-23 ENCOUNTER — Other Ambulatory Visit: Payer: Self-pay | Admitting: Family Medicine

## 2013-04-05 ENCOUNTER — Encounter: Payer: Self-pay | Admitting: Family Medicine

## 2013-04-05 ENCOUNTER — Ambulatory Visit (INDEPENDENT_AMBULATORY_CARE_PROVIDER_SITE_OTHER): Payer: Medicare Other | Admitting: Family Medicine

## 2013-04-05 ENCOUNTER — Encounter: Payer: Self-pay | Admitting: General Practice

## 2013-04-05 VITALS — BP 128/84 | HR 80 | Temp 98.3°F | Ht 60.0 in | Wt 191.2 lb

## 2013-04-05 DIAGNOSIS — Z0000 Encounter for general adult medical examination without abnormal findings: Secondary | ICD-10-CM

## 2013-04-05 DIAGNOSIS — I10 Essential (primary) hypertension: Secondary | ICD-10-CM

## 2013-04-05 DIAGNOSIS — Z1331 Encounter for screening for depression: Secondary | ICD-10-CM

## 2013-04-05 DIAGNOSIS — E2839 Other primary ovarian failure: Secondary | ICD-10-CM

## 2013-04-05 DIAGNOSIS — Z23 Encounter for immunization: Secondary | ICD-10-CM

## 2013-04-05 LAB — BASIC METABOLIC PANEL
GFR: 86.45 mL/min (ref >60.00)
Glucose, Bld: 110 mg/dL — ABNORMAL HIGH (ref 70–99)
Potassium: 4.1 mEq/L (ref 3.5–5.1)
Sodium: 140 mEq/L (ref 135–145)

## 2013-04-05 LAB — HEPATIC FUNCTION PANEL
AST: 20 U/L (ref 0–37)
Albumin: 3.6 g/dL (ref 3.5–5.2)
Alkaline Phosphatase: 79 U/L (ref 39–117)
Total Bilirubin: 0.4 mg/dL (ref 0.3–1.2)

## 2013-04-05 LAB — LIPID PANEL
HDL: 60.3 mg/dL (ref >39.00)
Total CHOL/HDL Ratio: 4
VLDL: 21.6 mg/dL (ref 0.0–40.0)

## 2013-04-05 LAB — CBC WITH DIFFERENTIAL/PLATELET
Eosinophils Relative: 2.1 % (ref 0.0–5.0)
MCV: 91.6 fl (ref 78.0–100.0)
Monocytes Absolute: 0.3 10*3/uL (ref 0.1–1.0)
Neutrophils Relative %: 48 % (ref 43.0–77.0)
Platelets: 235 10*3/uL (ref 150.0–400.0)
WBC: 6.4 10*3/uL (ref 4.5–10.5)

## 2013-04-05 NOTE — Assessment & Plan Note (Signed)
Pt's PE WNL.  UTD on colonoscopy, has mammo upcoming- will schedule DEXA at same time.  Pt feels she may have had pneumovax at pharmacy last year- will check paperwork at home.  Check labs.  Anticipatory guidance provided.

## 2013-04-05 NOTE — Progress Notes (Signed)
  Subjective:    Patient ID: Tracy [name], female    DOB: June 14, 1946, 67 y.o.   MRN: [id number]  HPI Here today for CPE.  Risk Factors: HTN- chronic problem, well controlled on Benicar.  No CP, SOB, HAs, visual changes, edema.  Physical Activity: no regular exercise, plans to start Fall Risk: low risk Depression: no current sxs Hearing: normal to conversational tones and whispered voice at 6 ft ADL's: independent Cognitive: normal linear thought process, memory and attention intact Home Safety: feels safe at home, daughter lives locally Height, Weight, BMI, Visual Acuity: see vitals, vision corrected to 20/20 w/ glasses Counseling: UTD on colonoscopy, mammo.  Due for DEXA.  No need for paps Labs Ordered: See A&P Care Plan: See A&P    Review of Systems Patient reports no vision/ hearing changes, adenopathy,fever, weight change,  persistant/recurrent hoarseness , swallowing issues, chest pain, palpitations, edema, persistant/recurrent cough, hemoptysis, dyspnea (rest/exertional/paroxysmal nocturnal), gastrointestinal bleeding (melena, rectal bleeding), abdominal pain, significant heartburn, bowel changes, GU symptoms (dysuria, hematuria, incontinence), Gyn symptoms (abnormal  bleeding, pain),  syncope, focal weakness, memory loss, numbness & tingling, skin/hair/nail changes, abnormal bruising or bleeding, anxiety, or depression.     Objective:   Physical Exam General Appearance:    Alert, cooperative, no distress, appears stated age  Head:    Normocephalic, without obvious abnormality, atraumatic  Eyes:    PERRL, conjunctiva/corneas clear, EOM's intact, fundi    benign, both eyes  Ears:    Normal TM's and external ear canals, both ears  Nose:   Nares normal, septum midline, mucosa normal, no drainage    or sinus tenderness  Throat:   Lips, mucosa, and tongue normal; teeth and gums normal  Neck:   Supple, symmetrical, trachea midline, no adenopathy;    Thyroid: no  enlargement/tenderness/nodules  Back:     Symmetric, no curvature, ROM normal, no CVA tenderness  Lungs:     Clear to auscultation bilaterally, respirations unlabored  Chest Wall:    No tenderness or deformity   Heart:    Regular rate and rhythm, S1 and S2 normal, no murmur, rub   or gallop  Breast Exam:    Deferred to mammo  Abdomen:     Soft, non-tender, bowel sounds active all four quadrants,    no masses, no organomegaly  Genitalia:    Deferred to hysterectomy  Rectal:    Extremities:   Extremities normal, atraumatic, no cyanosis or edema  Pulses:   2+ and symmetric all extremities  Skin:   Skin color, texture, turgor normal, no rashes or lesions  Lymph nodes:   Cervical, supraclavicular, and axillary nodes normal  Neurologic:   CNII-XII intact, normal strength, sensation and reflexes    throughout          Assessment & Plan:

## 2013-04-05 NOTE — Patient Instructions (Addendum)
Follow up in 6 months to recheck BP We'll notify you of your lab results and make any changes if needed Keep up the good work!  You look great! Your bone density order is in and they should do that when you do your mammo Call with any questions or concerns Happy [birth year][employer] Birthday!

## 2013-04-05 NOTE — Assessment & Plan Note (Signed)
Chronic problem.  Well controlled.  Asymptomatic.  Check labs.  No anticipated med changes. 

## 2013-04-06 ENCOUNTER — Other Ambulatory Visit: Payer: Self-pay | Admitting: Family Medicine

## 2013-04-06 DIAGNOSIS — Z1231 Encounter for screening mammogram for malignant neoplasm of breast: Secondary | ICD-10-CM

## 2013-04-07 ENCOUNTER — Other Ambulatory Visit: Payer: Self-pay | Admitting: *Deleted

## 2013-04-07 MED ORDER — ATORVASTATIN CALCIUM 10 MG PO TABS: 10.0000 mg | ORAL_TABLET | Freq: Every day | ORAL | Status: DC

## 2013-04-08 ENCOUNTER — Telehealth: Payer: Self-pay | Admitting: General Practice

## 2013-04-08 NOTE — Telephone Encounter (Signed)
Message left on triage line: pt expressed concerns regarding her recent labs. Called and LMOVM for pt to return call.

## 2013-04-09 LAB — VITAMIN D 1,25 DIHYDROXY
Vitamin D 1, 25 (OH)2 Total: 60 pg/mL (ref 18–72)
Vitamin D3 1, 25 (OH)2: 60 pg/mL

## 2013-04-09 NOTE — Telephone Encounter (Signed)
Spoke with pt states she had concerns when she received her lab results concerning her liver. Notified pt that it is protocol to test the liver functions 6-8 weeks after beginning a statin. Scheduled pt for fasting labs and placed a hepatic in 6 weeks.

## 2013-04-12 ENCOUNTER — Encounter: Payer: Self-pay | Admitting: General Practice

## 2013-05-03 ENCOUNTER — Ambulatory Visit
Admission: RE | Admit: 2013-05-03 | Discharge: 2013-05-03 | Disposition: A | Discharge status: Home / Self Care | Payer: Medicare Other | Source: Ambulatory Visit | Attending: Family Medicine | Admitting: Family Medicine

## 2013-05-03 ENCOUNTER — Telehealth: Payer: Self-pay | Admitting: Family Medicine

## 2013-05-03 DIAGNOSIS — Z1231 Encounter for screening mammogram for malignant neoplasm of breast: Secondary | ICD-10-CM

## 2013-05-03 DIAGNOSIS — E2839 Other primary ovarian failure: Secondary | ICD-10-CM

## 2013-05-03 LAB — HM DEXA SCAN

## 2013-05-03 NOTE — Telephone Encounter (Signed)
Typically 2 OTC Caltrate provided the needed amounts (600 + 400 x2) If not, can take 2000 Calcium and 1200 Vit D (2 of each of the named amounts)

## 2013-05-03 NOTE — Telephone Encounter (Signed)
Pt.notified

## 2013-05-03 NOTE — Telephone Encounter (Signed)
Please advise 

## 2013-05-03 NOTE — Telephone Encounter (Signed)
Patient states that we recommended she get Calcium 1200 and Vitamin D 800, however, they do not come in these strengths. Patient states that the Calcium comes in 400 and 1000 and the Vitamin D comes in the 600. Please advise.

## 2013-05-05 ENCOUNTER — Encounter: Payer: Self-pay | Admitting: Family Medicine

## 2013-05-10 ENCOUNTER — Encounter: Payer: Self-pay | Admitting: Family Medicine

## 2013-05-10 ENCOUNTER — Ambulatory Visit (INDEPENDENT_AMBULATORY_CARE_PROVIDER_SITE_OTHER): Payer: Medicare Other | Admitting: Family Medicine

## 2013-05-10 VITALS — BP 126/72 | HR 86 | Temp 99.1°F | Resp 16 | Wt 189.8 lb

## 2013-05-10 DIAGNOSIS — R110 Nausea: Secondary | ICD-10-CM

## 2013-05-10 MED ORDER — ONDANSETRON 4 MG PO TBDP: 4.0000 mg | ORAL_TABLET | Freq: Three times a day (TID) | ORAL | Status: DC | PRN

## 2013-05-10 NOTE — Assessment & Plan Note (Signed)
New.  Coupled w/ vague fatigue, suspect this is a viral illness.  Check labs to r/o bacterial infxn or underlying cause- pancreatitis, biliary abnormality, H pylori.  Zofran prn.  Reviewed supportive care and red flags that should prompt return.  Pt expressed understanding and is in agreement w/ plan.

## 2013-05-10 NOTE — Progress Notes (Signed)
  Subjective:    Patient ID: Tracy [name], female    DOB: 08/16/1945, 67 y.o.   MRN: [id number]  HPI Fever/chills- sxs started last week w/ generalized weakness.  Noted yesterday that sxs were worse after eating.  Having BM after each meal but denies loose stools/diarrhea.  Denies abd pain, 'it feels like i'm hungry', lots of nausea.  sxs don't improve w/ eating but 10-20 min after eating will have BM and then sxs improve.  No weakness or nausea w/ lying down.  No vomiting despite pt wishing she could.  Denies GERD- currently on Prilosec.  No known sick contacts.   Review of Systems For ROS see HPI     Objective:   Physical Exam  Vitals reviewed. Constitutional: She is oriented to person, place, and time. She appears well-developed and well-nourished. No distress.  HENT:  Head: Normocephalic and atraumatic.  Nose: Nose normal.  Mouth/Throat: Oropharynx is clear and moist.  MMM No TTP over sinuses  Neck: Neck supple.  Cardiovascular: Normal rate, regular rhythm and intact distal pulses.   Pulmonary/Chest: Effort normal and breath sounds normal. No respiratory distress. She has no wheezes. She has no rales.  Abdominal: Soft. She exhibits no distension. There is no tenderness. There is no rebound.  Hyperactive BS  Lymphadenopathy:    She has no cervical adenopathy.  Neurological: She is alert and oriented to person, place, and time.  Skin: Skin is warm and dry.          Assessment & Plan:

## 2013-05-10 NOTE — Patient Instructions (Signed)
We'll notify you of your lab results and make any changes if needed Drink plenty of fluids REST! Use the Zofran as needed for nausea- this dissolves under your tongue STOP the calcium I suspect this is a viral illness and will improve w/ time If not, or you have questions or concerns, please call! Hang in there!

## 2013-05-11 LAB — CBC WITH DIFFERENTIAL/PLATELET
Eosinophils Absolute: 0.2 10*3/uL (ref 0.0–0.7)
MCHC: 32.9 g/dL (ref 30.0–36.0)
MCV: 92.5 fl (ref 78.0–100.0)
Monocytes Absolute: 0.3 10*3/uL (ref 0.1–1.0)
Neutrophils Relative %: 46.6 % (ref 43.0–77.0)
Platelets: 289 10*3/uL (ref 150.0–400.0)

## 2013-05-11 LAB — BASIC METABOLIC PANEL
BUN: 9 mg/dL (ref 6–23)
Calcium: 9.3 mg/dL (ref 8.4–10.5)
GFR: 84.11 mL/min (ref >60.00)
Potassium: 4.3 mEq/L (ref 3.5–5.1)
Sodium: 139 mEq/L (ref 135–145)

## 2013-05-11 LAB — H. PYLORI ANTIBODY, IGG: H Pylori IgG: NEGATIVE

## 2013-05-11 LAB — HEPATIC FUNCTION PANEL
AST: 21 U/L (ref 0–37)
Alkaline Phosphatase: 91 U/L (ref 39–117)
Total Bilirubin: 0.4 mg/dL (ref 0.3–1.2)

## 2013-05-11 LAB — LIPASE: Lipase: 32 U/L (ref 11.0–59.0)

## 2013-05-17 ENCOUNTER — Telehealth: Payer: Self-pay | Admitting: General Practice

## 2013-05-17 NOTE — Telephone Encounter (Signed)
Spoke with pt advised she had stopped taking nasonex. Wil begin both meds together again to see if this helps. If not she will call to schedule an appt.

## 2013-05-17 NOTE — Telephone Encounter (Signed)
Pt should be using Claritin and Nasonex daily.  If not, needs to start and monitor for symptom improvement.  If she is, she will need an appt to be seen

## 2013-05-17 NOTE — Telephone Encounter (Signed)
Spoke with the patient and she advised that she is having eye weeping and crusties formed in the corners, along with thick mucus that is settling in her chest. States she is still having some nausea. Please advise if you would like me to schedule her with a provider or if an OTC you recommend.

## 2013-05-20 ENCOUNTER — Other Ambulatory Visit: Payer: Self-pay | Admitting: Family Medicine

## 2013-05-20 NOTE — Telephone Encounter (Signed)
Med filled.  

## 2013-05-24 ENCOUNTER — Other Ambulatory Visit: Payer: Medicare Other

## 2013-05-25 ENCOUNTER — Encounter: Payer: Self-pay | Admitting: Family Medicine

## 2013-05-25 DIAGNOSIS — M8580 Other specified disorders of bone density and structure, unspecified site: Secondary | ICD-10-CM | POA: Insufficient documentation

## 2013-08-17 ENCOUNTER — Other Ambulatory Visit: Payer: Self-pay | Admitting: Family Medicine

## 2013-08-17 NOTE — Telephone Encounter (Signed)
Med filled.  

## 2013-08-17 NOTE — Telephone Encounter (Signed)
Last OV 05-10-13 (nausea) Med filled 12-24-12 #30 with 6

## 2013-08-25 ENCOUNTER — Other Ambulatory Visit: Payer: Self-pay | Admitting: Family Medicine

## 2013-08-25 NOTE — Telephone Encounter (Signed)
Benicar refilled per protocol. JG//CMA 

## 2013-10-23 ENCOUNTER — Other Ambulatory Visit: Payer: Self-pay | Admitting: Family Medicine

## 2013-10-25 NOTE — Telephone Encounter (Signed)
Med filled.  

## 2013-11-06 ENCOUNTER — Emergency Department (HOSPITAL_COMMUNITY): Payer: Medicare Other

## 2013-11-06 ENCOUNTER — Encounter (HOSPITAL_COMMUNITY): Payer: Self-pay | Admitting: Emergency Medicine

## 2013-11-06 ENCOUNTER — Emergency Department (HOSPITAL_COMMUNITY)
Admission: EM | Admit: 2013-11-06 | Discharge: 2013-11-07 | Disposition: A | Discharge status: Home / Self Care | Payer: Medicare Other | Attending: Emergency Medicine | Admitting: Emergency Medicine

## 2013-11-06 DIAGNOSIS — R5383 Other fatigue: Secondary | ICD-10-CM

## 2013-11-06 DIAGNOSIS — R5381 Other malaise: Secondary | ICD-10-CM | POA: Insufficient documentation

## 2013-11-06 DIAGNOSIS — Z880 Allergy status to penicillin: Secondary | ICD-10-CM | POA: Insufficient documentation

## 2013-11-06 DIAGNOSIS — Z8709 Personal history of other diseases of the respiratory system: Secondary | ICD-10-CM | POA: Insufficient documentation

## 2013-11-06 DIAGNOSIS — Z79899 Other long term (current) drug therapy: Secondary | ICD-10-CM | POA: Insufficient documentation

## 2013-11-06 DIAGNOSIS — I252 Old myocardial infarction: Secondary | ICD-10-CM | POA: Insufficient documentation

## 2013-11-06 DIAGNOSIS — R51 Headache: Secondary | ICD-10-CM | POA: Insufficient documentation

## 2013-11-06 DIAGNOSIS — Z853 Personal history of malignant neoplasm of breast: Secondary | ICD-10-CM | POA: Insufficient documentation

## 2013-11-06 DIAGNOSIS — Z8673 Personal history of transient ischemic attack (TIA), and cerebral infarction without residual deficits: Secondary | ICD-10-CM | POA: Insufficient documentation

## 2013-11-06 DIAGNOSIS — Z7982 Long term (current) use of aspirin: Secondary | ICD-10-CM | POA: Insufficient documentation

## 2013-11-06 DIAGNOSIS — M129 Arthropathy, unspecified: Secondary | ICD-10-CM | POA: Insufficient documentation

## 2013-11-06 DIAGNOSIS — I10 Essential (primary) hypertension: Secondary | ICD-10-CM | POA: Insufficient documentation

## 2013-11-06 DIAGNOSIS — R519 Headache, unspecified: Secondary | ICD-10-CM

## 2013-11-06 LAB — BASIC METABOLIC PANEL
BUN: 7 mg/dL (ref 6–23)
CO2: 25 mEq/L (ref 19–32)
Calcium: 9.2 mg/dL (ref 8.4–10.5)
Chloride: 100 mEq/L (ref 96–112)
Creatinine, Ser: 0.82 mg/dL (ref 0.50–1.10)
GFR, EST AFRICAN AMERICAN: 83 mL/min — AB (ref >90)
GFR, EST NON AFRICAN AMERICAN: 71 mL/min — AB (ref >90)
Glucose, Bld: 127 mg/dL — ABNORMAL HIGH (ref 70–99)
POTASSIUM: 4 meq/L (ref 3.7–5.3)
Sodium: 138 mEq/L (ref 137–147)

## 2013-11-06 LAB — CBC WITH DIFFERENTIAL/PLATELET
BASOS PCT: 0 % (ref 0–1)
Basophils Absolute: 0 10*3/uL (ref 0.0–0.1)
Eosinophils Absolute: 0.1 10*3/uL (ref 0.0–0.7)
Eosinophils Relative: 1 % (ref 0–5)
HCT: 33.3 % — ABNORMAL LOW (ref 36.0–46.0)
HEMOGLOBIN: 11.2 g/dL — AB (ref 12.0–15.0)
Lymphocytes Relative: 17 % (ref 12–46)
Lymphs Abs: 1.3 10*3/uL (ref 0.7–4.0)
MCH: 30.2 pg (ref 26.0–34.0)
MCHC: 33.6 g/dL (ref 30.0–36.0)
MCV: 89.8 fL (ref 78.0–100.0)
MONOS PCT: 7 % (ref 3–12)
Monocytes Absolute: 0.6 10*3/uL (ref 0.1–1.0)
NEUTROS ABS: 5.7 10*3/uL (ref 1.7–7.7)
NEUTROS PCT: 75 % (ref 43–77)
PLATELETS: 235 10*3/uL (ref 150–400)
RBC: 3.71 MIL/uL — ABNORMAL LOW (ref 3.87–5.11)
RDW: 13.2 % (ref 11.5–15.5)
WBC: 7.5 10*3/uL (ref 4.0–10.5)

## 2013-11-06 MED ORDER — DEXAMETHASONE SODIUM PHOSPHATE 10 MG/ML IJ SOLN
10.0000 mg | Freq: Once | INTRAMUSCULAR | Status: AC
  Administered 2013-11-06: 10 mg via INTRAVENOUS
  Filled 2013-11-06: qty 1

## 2013-11-06 MED ORDER — DIPHENHYDRAMINE HCL 50 MG/ML IJ SOLN
25.0000 mg | Freq: Once | INTRAMUSCULAR | Status: AC
  Administered 2013-11-06: 25 mg via INTRAVENOUS
  Filled 2013-11-06: qty 1

## 2013-11-06 MED ORDER — MORPHINE SULFATE 4 MG/ML IJ SOLN
6.0000 mg | Freq: Once | INTRAMUSCULAR | Status: AC
  Administered 2013-11-06: 6 mg via INTRAVENOUS
  Filled 2013-11-06: qty 2

## 2013-11-06 MED ORDER — METOCLOPRAMIDE HCL 5 MG/ML IJ SOLN
10.0000 mg | Freq: Once | INTRAMUSCULAR | Status: AC
  Administered 2013-11-06: 10 mg via INTRAVENOUS
  Filled 2013-11-06: qty 2

## 2013-11-06 MED ORDER — ACETAMINOPHEN 325 MG PO TABS
650.0000 mg | ORAL_TABLET | Freq: Once | ORAL | Status: AC
Start: 2013-11-06 — End: 2013-11-07
  Administered 2013-11-07: 650 mg via ORAL
  Filled 2013-11-06: qty 2

## 2013-11-06 MED ORDER — IOHEXOL 350 MG/ML SOLN
100.0000 mL | Freq: Once | INTRAVENOUS | Status: AC | PRN
  Administered 2013-11-06: 100 mL via INTRAVENOUS

## 2013-11-06 MED ORDER — ONDANSETRON HCL 4 MG/2ML IJ SOLN
4.0000 mg | Freq: Once | INTRAMUSCULAR | Status: AC
  Administered 2013-11-06: 4 mg via INTRAVENOUS
  Filled 2013-11-06: qty 2

## 2013-11-06 MED ORDER — SODIUM CHLORIDE 0.9 % IV BOLUS (SEPSIS)
500.0000 mL | Freq: Once | INTRAVENOUS | Status: AC
Start: 2013-11-06 — End: 2013-11-06
  Administered 2013-11-06: 500 mL via INTRAVENOUS

## 2013-11-06 MED ORDER — SODIUM CHLORIDE 0.9 % IV BOLUS (SEPSIS)
500.0000 mL | Freq: Once | INTRAVENOUS | Status: AC
  Administered 2013-11-06: 500 mL via INTRAVENOUS

## 2013-11-06 NOTE — ED Provider Notes (Signed)
CSN: [id number]     Arrival date & time 11/06/13  1749 History   First MD Initiated Contact with Patient 11/06/13 1812     Chief Complaint  Patient presents with  . Headache    Headache x 2 days  . Weakness    feels shaky walking up or down  the stairs     (Consider location/radiation/quality/duration/timing/severity/associated sxs/prior Treatment) HPI Comments: 68 yo female with HTN, asthma presents with acute onset HA since Thursday, severe at onset, persistent since.  OTC meds not helping.  Mild tender upper neck.  No stiffness or fevers.  Pt has had ha but not this severe.  NO injuries.  General weakness.  HA generalized- worse frontal and posterior, ache.  Pt was at rest when it started. No temple tenderness.    Patient is a 68 y.o. female presenting with headaches and weakness. The history is provided by the patient.  Headache Associated symptoms: no abdominal pain, no back pain, no congestion, no fever, no neck pain, no neck stiffness, no numbness, no seizures and no vomiting   Weakness Associated symptoms include headaches. Pertinent negatives include no chest pain, no abdominal pain and no shortness of breath.    Past Medical History  Diagnosis Date  . Arthritis   . Hypertension   . Chronic bronchitis   . Myocardial infarction 2001  . Stroke   . Cancer 2000    breast cancer   Past Surgical History  Procedure Laterality Date  . Breast surgery  2001  . Abdominal hysterectomy  1985   Family History  Problem Relation Age of Onset  . Diabetes Father   . Cancer Sister   . Cancer Brother   . Heart disease Brother   . Diabetes Brother   . Hypertension Brother   . Diabetes Paternal Aunt   . Stroke Maternal Grandmother   . Diabetes Paternal Grandmother    History  Substance Use Topics  . Smoking status: Never Smoker   . Smokeless tobacco: Not on file  . Alcohol Use: No   OB History   Grav Para Term Preterm Abortions TAB SAB Ect Mult Living                  Review of Systems  Constitutional: Negative for fever and chills.  HENT: Negative for congestion.   Eyes: Negative for visual disturbance.  Respiratory: Negative for shortness of breath.   Cardiovascular: Negative for chest pain.  Gastrointestinal: Negative for vomiting and abdominal pain.  Genitourinary: Negative for dysuria and flank pain.  Musculoskeletal: Negative for back pain, neck pain and neck stiffness.  Skin: Negative for rash.  Neurological: Positive for weakness and headaches. Negative for seizures, light-headedness and numbness.      Allergies  Ambien; Amoxicillin; and Contrast media  Home Medications   Current Outpatient Rx  Name  Route  Sig  Dispense  Refill  . acetaminophen (TYLENOL) 500 MG tablet   Oral   Take 500 mg by mouth every 6 (six) hours as needed for headache.         . albuterol (PROAIR HFA) 108 (90 BASE) MCG/ACT inhaler   Inhalation   Inhale 2 puffs into the lungs every 4 (four) hours as needed for wheezing.   1 Inhaler   6   . aspirin 325 MG EC tablet   Oral   Take 325 mg by mouth daily.         Marland Kitchen atorvastatin (LIPITOR) 10 MG tablet   Oral  Take 10 mg by mouth daily.         . calcium carbonate (OS-CAL) 600 MG TABS tablet   Oral   Take 600 mg by mouth daily with breakfast.         . Cholecalciferol (VITAMIN D3) 3000 UNITS TABS   Oral   Take 1 capsule by mouth daily.         Marland Kitchen olmesartan (BENICAR) 20 MG tablet   Oral   Take 20 mg by mouth daily.         Marland Kitchen omeprazole (PRILOSEC) 40 MG capsule   Oral   Take 1 capsule (40 mg total) by mouth daily.   30 capsule   6   . traZODone (DESYREL) 100 MG tablet   Oral   Take 100 mg by mouth at bedtime.         Marland Kitchen EXPIRED: montelukast (SINGULAIR) 10 MG tablet   Oral   Take 1 tablet (10 mg total) by mouth at bedtime.   30 tablet   3    BP 139/65  Pulse 80  Temp(Src) 99.8 F (37.7 C) (Oral)  Resp 18  Wt 189 lb (85.73 kg)  SpO2 98% Physical Exam  Nursing note and  vitals reviewed. Constitutional: She is oriented to person, place, and time. She appears well-developed and well-nourished.  HENT:  Head: Normocephalic and atraumatic.  Eyes: Conjunctivae are normal. Right eye exhibits no discharge. Left eye exhibits no discharge.  Neck: Normal range of motion. Neck supple. No tracheal deviation present.  Cardiovascular: Normal rate and regular rhythm.   Pulmonary/Chest: Effort normal and breath sounds normal.  Abdominal: Soft. She exhibits no distension. There is no tenderness. There is no guarding.  Musculoskeletal: She exhibits no edema.  Neurological: She is alert and oriented to person, place, and time. GCS eye subscore is 4. GCS verbal subscore is 5. GCS motor subscore is 6.  5+ strength in UE and LE with f/e at major joints. Sensation to palpation intact in UE and LE. CNs 2-12 grossly intact.  EOMFI.  PERRL.   Finger nose and coordination intact bilateral.   Visual fields intact to finger testing. No meningismus  Skin: Skin is warm. No rash noted.  Psychiatric: She has a normal mood and affect.    ED Course  Procedures (including critical care time) Labs Review Labs Reviewed  CBC WITH DIFFERENTIAL - Abnormal; Notable for the following:    RBC 3.71 (*)    Hemoglobin 11.2 (*)    HCT 33.3 (*)    All other components within normal limits  BASIC METABOLIC PANEL - Abnormal; Notable for the following:    Glucose, Bld 127 (*)    GFR calc non Af Amer 71 (*)    GFR calc Af Amer 83 (*)    All other components within normal limits  CSF CULTURE  CSF CELL COUNT WITH DIFFERENTIAL  PROTEIN, CSF  GLUCOSE, CSF  URINALYSIS, ROUTINE W REFLEX MICROSCOPIC   Imaging Review Ct Head Wo Contrast  11/06/2013   CLINICAL DATA:  Headache, weakness, hypertension  EXAM: CT HEAD WITHOUT CONTRAST  TECHNIQUE: Contiguous axial images were obtained from the base of the skull through the vertex without intravenous contrast.  COMPARISON:  None.  FINDINGS: No mass lesion.  No midline shift. No acute hemorrhage or hematoma. No extra-axial fluid collections. No evidence of acute infarction. The calvarium is intact. Mild age-related atrophy.  IMPRESSION: No acute abnormalities   Electronically Signed   By: Elodia Florence.D.  On: 11/06/2013 20:44     EKG Interpretation None      MDM   Final diagnoses:  Headache   No signs of bacterial meningitis, no fever, no wbc elevation. Normal neuro exam, no temple tenderness.  With acute onset HA concern for possible SAH vs other (ie. Tension, migraine) Pt had mild rash with contrast in the past, denies angioedema or breathing sxs, pt stated it improved with benadryl. CT head no acute bleeding, pt improved on recheck.  Long discussion regarding small percentage of SAH leaks missed on CT scan. Discussed steroids/ benadryl and CT angio vs LP at bedside.  Pt refuses LP, she understands risks of dye reaction and wishes to proceed.   Radiology contacted to confirm recs, 1 hr after steroids and benadryl pt will have CT.  Repeat nausea/ pain medicines given. Signed out to fup CT results and dispo accordingly.  Radiology will do Fluro guided LP after one attempt by physician if needed.    Headache    Mariea Clonts, MD 11/07/13 0040

## 2013-11-06 NOTE — ED Notes (Signed)
Pt from home c/o HA x2 days with no relief from OTC meds. Pt denies hx of the same. Pt is A&O and in NAD. Pt family at bedside

## 2013-11-06 NOTE — ED Notes (Signed)
Pt stated that this is the worst headache she ever had. Pt reports increased headache since Thursday evening. Denies blurred vision. Legs feel shaky when walking up or down  the stairs. Denies trauma. Denies NVD. Treated with Tylenol and OTC sinus medications. Pt alert, oriented and appropriate. Ambulatory to treatment room, family at bedside.

## 2013-11-06 NOTE — ED Notes (Signed)
Call to radiology regarding LP.

## 2013-11-07 LAB — URINALYSIS, ROUTINE W REFLEX MICROSCOPIC
BILIRUBIN URINE: NEGATIVE
Glucose, UA: NEGATIVE mg/dL
Hgb urine dipstick: NEGATIVE
Ketones, ur: NEGATIVE mg/dL
LEUKOCYTES UA: NEGATIVE
NITRITE: NEGATIVE
PH: 5 (ref 5.0–8.0)
Protein, ur: NEGATIVE mg/dL
SPECIFIC GRAVITY, URINE: 1.018 (ref 1.005–1.030)
UROBILINOGEN UA: 0.2 mg/dL (ref 0.0–1.0)

## 2013-11-07 MED ORDER — IOHEXOL 350 MG/ML SOLN
100.0000 mL | Freq: Once | INTRAVENOUS | Status: AC | PRN
  Administered 2013-11-07: 100 mL via INTRAVENOUS

## 2013-11-07 NOTE — Discharge Instructions (Signed)
If you were given medicines take as directed.  If you are on coumadin or contraceptives realize their levels and effectiveness is altered by many different medicines.  If you have any reaction (rash, tongues swelling, other) to the medicines stop taking and see a physician.   Please follow up as directed and return to the ER or see a physician for new or worsening symptoms.  Thank you. Filed Vitals:   11/06/13 1758 11/06/13 2310  BP: 149/73 139/65  Pulse: 90 80  Temp: 98.9 F (37.2 C) 99.8 F (37.7 C)  TempSrc: Oral Oral  Resp: 18 18  Weight: 189 lb (85.73 kg)   SpO2: 98% 98%    Headaches, Frequently Asked Questions MIGRAINE HEADACHES Q: What is migraine? What causes it? How can I treat it? A: Generally, migraine headaches begin as a dull ache. Then they develop into a constant, throbbing, and pulsating pain. You may experience pain at the temples. You may experience pain at the front or back of one or both sides of the head. The pain is usually accompanied by a combination of:  Nausea.  Vomiting.  Sensitivity to light and noise. Some people (about 15%) experience an aura (see below) before an attack. The cause of migraine is believed to be chemical reactions in the brain. Treatment for migraine may include over-the-counter or prescription medications. It may also include self-help techniques. These include relaxation training and biofeedback.  Q: What is an aura? A: About 15% of people with migraine get an "aura". This is a sign of neurological symptoms that occur before a migraine headache. You may see wavy or jagged lines, dots, or flashing lights. You might experience tunnel vision or blind spots in one or both eyes. The aura can include visual or auditory hallucinations (something imagined). It may include disruptions in smell (such as strange odors), taste or touch. Other symptoms include:  Numbness.  A "pins and needles" sensation.  Difficulty in recalling or speaking the  correct word. These neurological events may last as long as 60 minutes. These symptoms will fade as the headache begins. Q: What is a trigger? A: Certain physical or environmental factors can lead to or "trigger" a migraine. These include:  Foods.  Hormonal changes.  Weather.  Stress. It is important to remember that triggers are different for everyone. To help prevent migraine attacks, you need to figure out which triggers affect you. Keep a headache diary. This is a good way to track triggers. The diary will help you talk to your healthcare professional about your condition. Q: Does weather affect migraines? A: Bright sunshine, hot, humid conditions, and drastic changes in barometric pressure may lead to, or "trigger," a migraine attack in some people. But studies have shown that weather does not act as a trigger for everyone with migraines. Q: What is the link between migraine and hormones? A: Hormones start and regulate many of your body's functions. Hormones keep your body in balance within a constantly changing environment. The levels of hormones in your body are unbalanced at times. Examples are during menstruation, pregnancy, or menopause. That can lead to a migraine attack. In fact, about three quarters of all women with migraine report that their attacks are related to the menstrual cycle.  Q: Is there an increased risk of stroke for migraine sufferers? A: The likelihood of a migraine attack causing a stroke is very remote. That is not to say that migraine sufferers cannot have a stroke associated with their migraines. In persons  under age 79, the most common associated factor for stroke is migraine headache. But over the course of a person's normal life span, the occurrence of migraine headache may actually be associated with a reduced risk of dying from cerebrovascular disease due to stroke.  Q: What are acute medications for migraine? A: Acute medications are used to treat the pain  of the headache after it has started. Examples over-the-counter medications, NSAIDs, ergots, and triptans.  Q: What are the triptans? A: Triptans are the newest class of abortive medications. They are specifically targeted to treat migraine. Triptans are vasoconstrictors. They moderate some chemical reactions in the brain. The triptans work on receptors in your brain. Triptans help to restore the balance of a neurotransmitter called serotonin. Fluctuations in levels of serotonin are thought to be a main cause of migraine.  Q: Are over-the-counter medications for migraine effective? A: Over-the-counter, or "OTC," medications may be effective in relieving mild to moderate pain and associated symptoms of migraine. But you should see your caregiver before beginning any treatment regimen for migraine.  Q: What are preventive medications for migraine? A: Preventive medications for migraine are sometimes referred to as "prophylactic" treatments. They are used to reduce the frequency, severity, and length of migraine attacks. Examples of preventive medications include antiepileptic medications, antidepressants, beta-blockers, calcium channel blockers, and NSAIDs (nonsteroidal anti-inflammatory drugs). Q: Why are anticonvulsants used to treat migraine? A: During the past few years, there has been an increased interest in antiepileptic drugs for the prevention of migraine. They are sometimes referred to as "anticonvulsants". Both epilepsy and migraine may be caused by similar reactions in the brain.  Q: Why are antidepressants used to treat migraine? A: Antidepressants are typically used to treat people with depression. They may reduce migraine frequency by regulating chemical levels, such as serotonin, in the brain.  Q: What alternative therapies are used to treat migraine? A: The term "alternative therapies" is often used to describe treatments considered outside the scope of conventional Western medicine.  Examples of alternative therapy include acupuncture, acupressure, and yoga. Another common alternative treatment is herbal therapy. Some herbs are believed to relieve headache pain. Always discuss alternative therapies with your caregiver before proceeding. Some herbal products contain arsenic and other toxins. TENSION HEADACHES Q: What is a tension-type headache? What causes it? How can I treat it? A: Tension-type headaches occur randomly. They are often the result of temporary stress, anxiety, fatigue, or anger. Symptoms include soreness in your temples, a tightening band-like sensation around your head (a "vice-like" ache). Symptoms can also include a pulling feeling, pressure sensations, and contracting head and neck muscles. The headache begins in your forehead, temples, or the back of your head and neck. Treatment for tension-type headache may include over-the-counter or prescription medications. Treatment may also include self-help techniques such as relaxation training and biofeedback. CLUSTER HEADACHES Q: What is a cluster headache? What causes it? How can I treat it? A: Cluster headache gets its name because the attacks come in groups. The pain arrives with little, if any, warning. It is usually on one side of the head. A tearing or bloodshot eye and a runny nose on the same side of the headache may also accompany the pain. Cluster headaches are believed to be caused by chemical reactions in the brain. They have been described as the most severe and intense of any headache type. Treatment for cluster headache includes prescription medication and oxygen. SINUS HEADACHES Q: What is a sinus headache? What causes it? How  can I treat it? A: When a cavity in the bones of the face and skull (a sinus) becomes inflamed, the inflammation will cause localized pain. This condition is usually the result of an allergic reaction, a tumor, or an infection. If your headache is caused by a sinus blockage, such as  an infection, you will probably have a fever. An x-ray will confirm a sinus blockage. Your caregiver's treatment might include antibiotics for the infection, as well as antihistamines or decongestants.  REBOUND HEADACHES Q: What is a rebound headache? What causes it? How can I treat it? A: A pattern of taking acute headache medications too often can lead to a condition known as "rebound headache." A pattern of taking too much headache medication includes taking it more than 2 days per week or in excessive amounts. That means more than the label or a caregiver advises. With rebound headaches, your medications not only stop relieving pain, they actually begin to cause headaches. Doctors treat rebound headache by tapering the medication that is being overused. Sometimes your caregiver will gradually substitute a different type of treatment or medication. Stopping may be a challenge. Regularly overusing a medication increases the potential for serious side effects. Consult a caregiver if you regularly use headache medications more than 2 days per week or more than the label advises. ADDITIONAL QUESTIONS AND ANSWERS Q: What is biofeedback? A: Biofeedback is a self-help treatment. Biofeedback uses special equipment to monitor your body's involuntary physical responses. Biofeedback monitors:  Breathing.  Pulse.  Heart rate.  Temperature.  Muscle tension.  Brain activity. Biofeedback helps you refine and perfect your relaxation exercises. You learn to control the physical responses that are related to stress. Once the technique has been mastered, you do not need the equipment any more. Q: Are headaches hereditary? A: Four out of five (80%) of people that suffer report a family history of migraine. Scientists are not sure if this is genetic or a family predisposition. Despite the uncertainty, a child has a 50% chance of having migraine if one parent suffers. The child has a 75% chance if both parents  suffer.  Q: Can children get headaches? A: By the time they reach high school, most young people have experienced some type of headache. Many safe and effective approaches or medications can prevent a headache from occurring or stop it after it has begun.  Q: What type of doctor should I see to diagnose and treat my headache? A: Start with your primary caregiver. Discuss his or her experience and approach to headaches. Discuss methods of classification, diagnosis, and treatment. Your caregiver may decide to recommend you to a headache specialist, depending upon your symptoms or other physical conditions. Having diabetes, allergies, etc., may require a more comprehensive and inclusive approach to your headache. The [employer] will provide, upon request, a list of [facility] physician members in your state. Document Released: 10/05/2003 Document Revised: 10/07/2011 Document Reviewed: 03/14/2008 [facility] Patient Information 2014 [place].

## 2013-11-07 NOTE — ED Notes (Signed)
Patient transported to CT 

## 2013-11-07 NOTE — ED Provider Notes (Signed)
Care assumed from Dr. Reather Converse.  Patient was pending a CT angio of the head , concern for possible aneurysm.  CT angio without specific findings.  Patient to followup with her primary care Dr. and possible referral to neurology.  Results for orders placed during the hospital encounter of 11/06/13  CBC WITH DIFFERENTIAL      Result Value Ref Range   WBC 7.5  4.0 - 10.5 K/uL   RBC 3.71 (*) 3.87 - 5.11 MIL/uL   Hemoglobin 11.2 (*) 12.0 - 15.0 g/dL   HCT 33.3 (*) 36.0 - 46.0 %   MCV 89.8  78.0 - 100.0 fL   MCH 30.2  26.0 - 34.0 pg   MCHC 33.6  30.0 - 36.0 g/dL   RDW 13.2  11.5 - 15.5 %   Platelets 235  150 - 400 K/uL   Neutrophils Relative % 75  43 - 77 %   Neutro Abs 5.7  1.7 - 7.7 K/uL   Lymphocytes Relative 17  12 - 46 %   Lymphs Abs 1.3  0.7 - 4.0 K/uL   Monocytes Relative 7  3 - 12 %   Monocytes Absolute 0.6  0.1 - 1.0 K/uL   Eosinophils Relative 1  0 - 5 %   Eosinophils Absolute 0.1  0.0 - 0.7 K/uL   Basophils Relative 0  0 - 1 %   Basophils Absolute 0.0  0.0 - 0.1 K/uL  BASIC METABOLIC PANEL      Result Value Ref Range   Sodium 138  137 - 147 mEq/L   Potassium 4.0  3.7 - 5.3 mEq/L   Chloride 100  96 - 112 mEq/L   CO2 25  19 - 32 mEq/L   Glucose, Bld 127 (*) 70 - 99 mg/dL   BUN 7  6 - 23 mg/dL   Creatinine, Ser 0.82  0.50 - 1.10 mg/dL   Calcium 9.2  8.4 - 10.5 mg/dL   GFR calc non Af Amer 71 (*) >90 mL/min   GFR calc Af Amer 83 (*) >90 mL/min  URINALYSIS, ROUTINE W REFLEX MICROSCOPIC      Result Value Ref Range   Color, Urine YELLOW  YELLOW   APPearance CLEAR  CLEAR   Specific Gravity, Urine 1.018  1.005 - 1.030   pH 5.0  5.0 - 8.0   Glucose, UA NEGATIVE  NEGATIVE mg/dL   Hgb urine dipstick NEGATIVE  NEGATIVE   Bilirubin Urine NEGATIVE  NEGATIVE   Ketones, ur NEGATIVE  NEGATIVE mg/dL   Protein, ur NEGATIVE  NEGATIVE mg/dL   Urobilinogen, UA 0.2  0.0 - 1.0 mg/dL   Nitrite NEGATIVE  NEGATIVE   Leukocytes, UA NEGATIVE  NEGATIVE   Ct Angio Head W/cm &/or Wo  Cm  11/07/2013   CLINICAL DATA:  Acute headache and weakness.  EXAM: CT ANGIOGRAPHY HEAD  TECHNIQUE: Multidetector CT imaging of the head was performed using the standard protocol during bolus administration of intravenous contrast. Multiplanar CT image reconstructions and MIPs were obtained to evaluate the vascular anatomy.  CONTRAST:  143mL OMNIPAQUE IOHEXOL 350 MG/ML SOLN  COMPARISON:  CT HEAD W/O CM dated 11/06/2013  FINDINGS: Anterior circulation: Normal appearance of the cervical internal carotid arteries, petrous, cavernous and supra clinoid internal carotid arteries. Widely patent anterior communicating artery. Congenitally small right A1 segment. Normal appearance of the anterior and middle cerebral arteries.  Posterior circulation: Left vertebral artery is dominant, with normal appearance of the vertebral arteries, vertebrobasilar junction and basilar artery, as well  as main branch vessels. Robust right posterior communicating artery provides the dominant vascular supply to the right posterior cerebral artery. Normal appearance of posterior cerebral arteries.  No large vessel occlusion, hemodynamically significant stenosis, dissection, luminal irregularity, contrast extravasation or aneurysm within the anterior nor posterior circulation.  Review of the MIP images confirms the above findings.  IMPRESSION: No hemodynamically significant stenosis or aneurysm, no vascular finding to explain headache.   Electronically Signed   By: Elon Alas   On: 11/07/2013 01:03   Ct Head Wo Contrast  11/06/2013   CLINICAL DATA:  Headache, weakness, hypertension  EXAM: CT HEAD WITHOUT CONTRAST  TECHNIQUE: Contiguous axial images were obtained from the base of the skull through the vertex without intravenous contrast.  COMPARISON:  None.  FINDINGS: No mass lesion. No midline shift. No acute hemorrhage or hematoma. No extra-axial fluid collections. No evidence of acute infarction. The calvarium is intact. Mild  age-related atrophy.  IMPRESSION: No acute abnormalities   Electronically Signed   By: Skipper Cliche M.D.   On: 11/06/2013 20:44      Kalman Drape, MD 11/07/13 [phone]

## 2013-11-09 ENCOUNTER — Telehealth: Payer: Self-pay | Admitting: Family Medicine

## 2013-11-09 NOTE — Telephone Encounter (Signed)
Spoke with patient.  Stated that her ENT provider was aware of her taking Asprin 81 mg daily, but directed her to continue taking both medications until the inflammation goes down.  Patient was encouraged to take medications as prescribed by her providers.  If she has any complications to notify her ENT provider.  Patient stated understanding and agreed with plan.

## 2013-11-09 NOTE — Telephone Encounter (Signed)
Patient called stating that she saw her ENT doctor today and he stated that she has inflammation around her ear. Patient was instructed to take Ibuprofen to help with the inflammation. Patient is wanting to know can she stop taking Asprin for a while and take ibuprofen. Please advise

## 2013-11-11 ENCOUNTER — Encounter: Payer: Self-pay | Admitting: Family Medicine

## 2013-11-11 ENCOUNTER — Ambulatory Visit (INDEPENDENT_AMBULATORY_CARE_PROVIDER_SITE_OTHER): Payer: Medicare Other | Admitting: Family Medicine

## 2013-11-11 ENCOUNTER — Other Ambulatory Visit: Payer: Self-pay | Admitting: Family Medicine

## 2013-11-11 ENCOUNTER — Ambulatory Visit (INDEPENDENT_AMBULATORY_CARE_PROVIDER_SITE_OTHER)
Admission: RE | Admit: 2013-11-11 | Discharge: 2013-11-11 | Disposition: A | Discharge status: Home / Self Care | Payer: Medicare Other | Source: Ambulatory Visit | Attending: Family Medicine | Admitting: Family Medicine

## 2013-11-11 VITALS — BP 176/104 | HR 99 | Temp 98.4°F | Resp 17 | Wt 201.0 lb

## 2013-11-11 DIAGNOSIS — M5090 Cervical disc disorder, unspecified, unspecified cervical region: Secondary | ICD-10-CM | POA: Insufficient documentation

## 2013-11-11 DIAGNOSIS — M5030 Other cervical disc degeneration, unspecified cervical region: Secondary | ICD-10-CM

## 2013-11-11 DIAGNOSIS — M542 Cervicalgia: Secondary | ICD-10-CM

## 2013-11-11 DIAGNOSIS — I10 Essential (primary) hypertension: Secondary | ICD-10-CM

## 2013-11-11 DIAGNOSIS — M4802 Spinal stenosis, cervical region: Secondary | ICD-10-CM

## 2013-11-11 DIAGNOSIS — R519 Headache, unspecified: Secondary | ICD-10-CM | POA: Insufficient documentation

## 2013-11-11 DIAGNOSIS — R51 Headache: Secondary | ICD-10-CM

## 2013-11-11 MED ORDER — HYDROCODONE-ACETAMINOPHEN 5-325 MG PO TABS
1.0000 | ORAL_TABLET | Freq: Four times a day (QID) | ORAL | Status: DC | PRN
Start: 2013-11-11 — End: 2014-05-19

## 2013-11-11 MED ORDER — CYCLOBENZAPRINE HCL 10 MG PO TABS: 10.0000 mg | ORAL_TABLET | Freq: Three times a day (TID) | ORAL | Status: DC | PRN

## 2013-11-11 NOTE — Patient Instructions (Signed)
Please go get your xray at [facility] when you have someone to drive you Start the Hydrocodone and Flexeril for pain and spasm HEAT! If you have worsening pain, weakness, numbness, blurry or double vision (or any other concerns) GO TO THE ER Call with any questions or concerns Hang in there!!

## 2013-11-11 NOTE — Assessment & Plan Note (Signed)
New.  Pt had normal CT and angiogram 5 days ago.  Pt's pain may be atypical migraine.  Start pain meds and muscle relaxer.  Reviewed strict instructions w/ pt to return to ER if symptoms change or worsen.  Pt expressed understanding and is in agreement w/ plan.

## 2013-11-11 NOTE — Progress Notes (Signed)
   Subjective:    Patient ID: Tracy [name], female    DOB: Aug 19, 1945, 68 y.o.   MRN: [id number]  HPI HA- started 1 week ago w/ severe HA upon standing.  Described as an 'intense spasm'.  Now having similar pain w/ leaning forward, leaning backwards, or changing positions.  Saturday pain was near constant on Saturday prompting her to go to ER.  Had normal head CT and CT angiogram.  Pt reports pain makes her weak.  No vertigo.  Pain in L side of neck- 'dull ache'.  Took 800mg  ibuprofen last night w/o relief.  Painful to turn head, touch chin to chest.  No fever.  No blurry or double vision.  No CP, SOB.  Pt reports that if she's able to rest her chin on her chest and leave it there, it is more comfortable 'but i have to pick it up to walk' and that is painful.   Review of Systems For ROS see HPI     Objective:   Physical Exam  Vitals reviewed. Constitutional: She is oriented to person, place, and time. She appears well-developed and well-nourished. She appears distressed (obviously in pain).  Neck:  L trap spasm No carotid bruits  Cardiovascular: Normal rate, regular rhythm, normal heart sounds and intact distal pulses.   Pulmonary/Chest: Effort normal and breath sounds normal. No respiratory distress. She has no wheezes. She has no rales.  Musculoskeletal: She exhibits tenderness (TTP over L trap and L pareital bone).  Neurological: She is alert and oriented to person, place, and time. She has normal reflexes. No cranial nerve deficit. Coordination normal.  No meningismus  Skin: Skin is warm and dry. No erythema.          Assessment & Plan:

## 2013-11-11 NOTE — Assessment & Plan Note (Signed)
New.  TTP over L trap spasm.  Pain actually improves w/ chin on chest so not meningitis.  No carotid bruit to suggest dissection.  Pt had normal head CT and angiogram 5 days ago.  Start pain meds and muscle relaxers.  Get Cspine films.  Pt placed in C collar for support.  Will follow closely.  Pt given strict instructions to return to ER if her symptoms were to change or worsen.  Pt expressed understanding and is in agreement w/ plan.

## 2013-11-11 NOTE — Progress Notes (Signed)
Pre visit review using our clinic review tool, if applicable. No additional management support is needed unless otherwise documented below in the visit note. 

## 2013-11-11 NOTE — Assessment & Plan Note (Signed)
Deteriorated.  Suspect this is due to pt's current pain level.  No med changes but will follow closely.

## 2013-11-12 ENCOUNTER — Telehealth: Payer: Self-pay | Admitting: Family Medicine

## 2013-11-12 NOTE — Telephone Encounter (Signed)
Patient called stating she wants to know if she can go back to the neurologist she saw in the past before she goes to see a Psychologist, sport and exercise. Please advise pt.

## 2013-11-12 NOTE — Telephone Encounter (Signed)
Please advise 

## 2013-11-12 NOTE — Telephone Encounter (Signed)
A neurologist is not going to be able to treat the degenerative changes in the neck.  This would just prolong the time it takes to get to [facility]

## 2013-11-12 NOTE — Telephone Encounter (Signed)
Pt is wanting to go to neurosurgery now. She is in severe pain.

## 2013-11-24 ENCOUNTER — Other Ambulatory Visit: Payer: Self-pay | Admitting: Family Medicine

## 2013-11-24 NOTE — Telephone Encounter (Signed)
Med filled.  

## 2013-11-24 NOTE — Telephone Encounter (Signed)
Last OV 11-11-13 Med filled 08-17-13 #30 with 0

## 2013-12-06 ENCOUNTER — Ambulatory Visit (INDEPENDENT_AMBULATORY_CARE_PROVIDER_SITE_OTHER): Payer: Medicare Other | Admitting: Family Medicine

## 2013-12-06 ENCOUNTER — Encounter: Payer: Self-pay | Admitting: Family Medicine

## 2013-12-06 ENCOUNTER — Encounter: Payer: Self-pay | Admitting: Lab

## 2013-12-06 VITALS — BP 128/62 | HR 99 | Temp 98.7°F | Resp 16 | Wt 195.4 lb

## 2013-12-06 DIAGNOSIS — E785 Hyperlipidemia, unspecified: Secondary | ICD-10-CM

## 2013-12-06 DIAGNOSIS — I10 Essential (primary) hypertension: Secondary | ICD-10-CM

## 2013-12-06 DIAGNOSIS — K219 Gastro-esophageal reflux disease without esophagitis: Secondary | ICD-10-CM

## 2013-12-06 DIAGNOSIS — M5090 Cervical disc disorder, unspecified, unspecified cervical region: Secondary | ICD-10-CM

## 2013-12-06 MED ORDER — OLMESARTAN MEDOXOMIL 20 MG PO TABS: 20.0000 mg | ORAL_TABLET | Freq: Every day | ORAL | Status: DC

## 2013-12-06 MED ORDER — ATORVASTATIN CALCIUM 10 MG PO TABS: 10.0000 mg | ORAL_TABLET | Freq: Every day | ORAL | Status: DC

## 2013-12-06 MED ORDER — OMEPRAZOLE 40 MG PO CPDR: 40.0000 mg | DELAYED_RELEASE_CAPSULE | Freq: Every day | ORAL | Status: DC

## 2013-12-06 MED ORDER — ALBUTEROL SULFATE HFA 108 (90 BASE) MCG/ACT IN AERS: 2.0000 | INHALATION_SPRAY | RESPIRATORY_TRACT | Status: DC | PRN

## 2013-12-06 NOTE — Progress Notes (Signed)
   Subjective:    Patient ID: Tracy [name], female    DOB: Jul 07, 1946, 68 y.o.   MRN: [id number]  HPI HTN- chronic problem, well controlled today on Benicar.  Denies CP, SOB, HAs, visual changes, edema.  Hyperlipidemia- chronic problem, on Lipitor.  Denies abd pain, N/V, myalgias  GERD- needs refill on Omeprazole.   Review of Systems For ROS see HPI     Objective:   Physical Exam  Vitals reviewed. Constitutional: She is oriented to person, place, and time. She appears well-developed and well-nourished. No distress.  HENT:  Head: Normocephalic and atraumatic.  Eyes: Conjunctivae and EOM are normal. Pupils are equal, round, and reactive to light.  Neck: Neck supple. No thyromegaly present.  Limited ROM due to cervical arthropathy  Cardiovascular: Normal rate, regular rhythm, normal heart sounds and intact distal pulses.   No murmur heard. Pulmonary/Chest: Effort normal and breath sounds normal. No respiratory distress.  Abdominal: Soft. She exhibits no distension. There is no tenderness.  Musculoskeletal: She exhibits no edema.  Lymphadenopathy:    She has no cervical adenopathy.  Neurological: She is alert and oriented to person, place, and time.  Skin: Skin is warm and dry.  Psychiatric: She has a normal mood and affect. Her behavior is normal.          Assessment & Plan:

## 2013-12-06 NOTE — Patient Instructions (Signed)
Schedule your complete physical for September We'll notify you of your lab results and make any changes if needed Try and get regular exercise and make healthy food choices Ask the Neurosurgeon about the next steps for your neck pain Call with any questions or concerns Happy [place] Mother[place] Day!

## 2013-12-06 NOTE — Progress Notes (Signed)
Pre visit review using our clinic review tool, if applicable. No additional management support is needed unless otherwise documented below in the visit note. 

## 2013-12-07 LAB — LIPID PANEL
CHOL/HDL RATIO: 2
Cholesterol: 137 mg/dL (ref 0–200)
HDL: 56.7 mg/dL (ref >39.00)
LDL Cholesterol: 69 mg/dL (ref 0–99)
Triglycerides: 59 mg/dL (ref 0.0–149.0)
VLDL: 11.8 mg/dL (ref 0.0–40.0)

## 2013-12-07 LAB — BASIC METABOLIC PANEL
BUN: 6 mg/dL (ref 6–23)
CALCIUM: 9.1 mg/dL (ref 8.4–10.5)
CHLORIDE: 105 meq/L (ref 96–112)
CO2: 25 meq/L (ref 19–32)
CREATININE: 0.8 mg/dL (ref 0.4–1.2)
GFR: 88.71 mL/min (ref >60.00)
GLUCOSE: 140 mg/dL — AB (ref 70–99)
Potassium: 4 mEq/L (ref 3.5–5.1)
Sodium: 138 mEq/L (ref 135–145)

## 2013-12-07 LAB — HEPATIC FUNCTION PANEL
ALBUMIN: 3.6 g/dL (ref 3.5–5.2)
ALT: 18 U/L (ref 0–35)
AST: 26 U/L (ref 0–37)
Alkaline Phosphatase: 86 U/L (ref 39–117)
BILIRUBIN DIRECT: 0 mg/dL (ref 0.0–0.3)
TOTAL PROTEIN: 7.5 g/dL (ref 6.0–8.3)
Total Bilirubin: 0.4 mg/dL (ref 0.2–1.2)

## 2013-12-07 NOTE — Assessment & Plan Note (Signed)
Refill provided on Omeprazole 

## 2013-12-07 NOTE — Assessment & Plan Note (Signed)
Chronic problem.  Much better control today.  Asymptomatic.  Check labs.  No anticipated med changes.

## 2013-12-07 NOTE — Assessment & Plan Note (Signed)
Noted cervical arthropathy on recent xray.  Pt reports she has seen neurosurg and had MRI.  Continues to have pain.  Encouraged her to follow up to discuss next steps.  Pt expressed understanding and is in agreement w/ plan.

## 2013-12-07 NOTE — Assessment & Plan Note (Signed)
Chronic problem.  Tolerating statin w/o difficulty.  Check labs.  Adjust meds prn  

## 2013-12-27 ENCOUNTER — Encounter: Payer: Self-pay | Admitting: Family Medicine

## 2013-12-30 ENCOUNTER — Other Ambulatory Visit: Payer: Self-pay | Admitting: Family Medicine

## 2013-12-30 NOTE — Telephone Encounter (Signed)
Med filled.  

## 2014-01-19 ENCOUNTER — Emergency Department (HOSPITAL_COMMUNITY)
Admission: EM | Admit: 2014-01-19 | Discharge: 2014-01-19 | Disposition: A | Discharge status: Home / Self Care | Payer: Medicare Other | Attending: Emergency Medicine | Admitting: Emergency Medicine

## 2014-01-19 ENCOUNTER — Telehealth: Payer: Self-pay | Admitting: Family Medicine

## 2014-01-19 ENCOUNTER — Encounter (HOSPITAL_COMMUNITY): Payer: Self-pay | Admitting: Emergency Medicine

## 2014-01-19 DIAGNOSIS — Z7982 Long term (current) use of aspirin: Secondary | ICD-10-CM | POA: Insufficient documentation

## 2014-01-19 DIAGNOSIS — Z880 Allergy status to penicillin: Secondary | ICD-10-CM | POA: Insufficient documentation

## 2014-01-19 DIAGNOSIS — Z853 Personal history of malignant neoplasm of breast: Secondary | ICD-10-CM | POA: Insufficient documentation

## 2014-01-19 DIAGNOSIS — Z79899 Other long term (current) drug therapy: Secondary | ICD-10-CM | POA: Insufficient documentation

## 2014-01-19 DIAGNOSIS — Z8709 Personal history of other diseases of the respiratory system: Secondary | ICD-10-CM | POA: Insufficient documentation

## 2014-01-19 DIAGNOSIS — M129 Arthropathy, unspecified: Secondary | ICD-10-CM | POA: Insufficient documentation

## 2014-01-19 DIAGNOSIS — Z8673 Personal history of transient ischemic attack (TIA), and cerebral infarction without residual deficits: Secondary | ICD-10-CM | POA: Insufficient documentation

## 2014-01-19 DIAGNOSIS — K921 Melena: Secondary | ICD-10-CM

## 2014-01-19 DIAGNOSIS — I252 Old myocardial infarction: Secondary | ICD-10-CM | POA: Insufficient documentation

## 2014-01-19 DIAGNOSIS — I10 Essential (primary) hypertension: Secondary | ICD-10-CM | POA: Insufficient documentation

## 2014-01-19 LAB — I-STAT CHEM 8, ED
BUN: 9 mg/dL (ref 6–23)
CALCIUM ION: 1.1 mmol/L — AB (ref 1.13–1.30)
Chloride: 106 mEq/L (ref 96–112)
Creatinine, Ser: 1 mg/dL (ref 0.50–1.10)
GLUCOSE: 91 mg/dL (ref 70–99)
HEMATOCRIT: 35 % — AB (ref 36.0–46.0)
HEMOGLOBIN: 11.9 g/dL — AB (ref 12.0–15.0)
Potassium: 4.3 mEq/L (ref 3.7–5.3)
Sodium: 143 mEq/L (ref 137–147)
TCO2: 23 mmol/L (ref 0–100)

## 2014-01-19 LAB — POC OCCULT BLOOD, ED: Fecal Occult Bld: POSITIVE — AB

## 2014-01-19 NOTE — ED Notes (Signed)
Pt states that she only ate cheese and crackers for 2 days last week and got constipated.  Took a laxative and pepto bismol today and had dark stools which she believes may be blood in her stool.  Was sent here by her doctor because she did not have any appts for today.  No vomiting.

## 2014-01-19 NOTE — Telephone Encounter (Signed)
Spoke with Terrence Dupont from [facility] was advised that the patient is experiencing black stools, advised to have pt be evaluated in the ED

## 2014-01-19 NOTE — ED Provider Notes (Signed)
CSN: [id number]     Arrival date & time 01/19/14  1124 History   First MD Initiated Contact with Patient 01/19/14 1143     Chief Complaint  Patient presents with  . Melena     (Consider location/radiation/quality/duration/timing/severity/associated sxs/prior Treatment) HPI  68 year old female with prior history of stroke, MI, presents for evaluation of abnormal stool color.  Pt report she normally have BM daily.  She has been on a cheese and cracker diet for the past 3-4 days and states she has been having constipation for the past several days with upset stomach. She took Pepto-Bismol yesterday and this morning she has a diarrheal bowel movement with black stool which she thought to be blood. Her abdominal discomfort did improve after BM.  She consult with the primary care doctor who recommend patient to come to the ER for further evaluation because he did not have an appointment available for today. Prior hx of GERD, had recent endoscopy and sts it was normal.  Does take omeprazole daily.  Report mild nausea.  Did report an ongoing dizziness sensation that is chronic.  Has been seen by ENT and felt it was likely related to inner ear inflammation.  No new dizziness.  No lightheadedness.  No CP, SOB, no hemoptysis, dysuria, hematuria, weakness or numbness.  No active vomiting.    Past Medical History  Diagnosis Date  . Arthritis   . Hypertension   . Chronic bronchitis   . Myocardial infarction 2001  . Stroke   . Cancer 2000    breast cancer   Past Surgical History  Procedure Laterality Date  . Breast surgery  2001  . Abdominal hysterectomy  1985   Family History  Problem Relation Age of Onset  . Diabetes Father   . Cancer Sister   . Cancer Brother   . Heart disease Brother   . Diabetes Brother   . Hypertension Brother   . Diabetes Paternal Aunt   . Stroke Maternal Grandmother   . Diabetes Paternal Grandmother    History  Substance Use Topics  . Smoking status: Never Smoker    . Smokeless tobacco: Not on file  . Alcohol Use: No   OB History   Grav Para Term Preterm Abortions TAB SAB Ect Mult Living                 Review of Systems  All other systems reviewed and are negative.     Allergies  Ambien; Amoxicillin; Contrast media; and Tape  Home Medications   Prior to Admission medications   Medication Sig Start Date End Date Taking? Authorizing Provider  acetaminophen (TYLENOL) 500 MG tablet Take 500 mg by mouth every 6 (six) hours as needed for headache.    Historical Provider, MD  albuterol (PROAIR HFA) 108 (90 BASE) MCG/ACT inhaler Inhale 2 puffs into the lungs every 4 (four) hours as needed for wheezing. 12/06/13   [name], MD  aspirin 325 MG EC tablet Take 325 mg by mouth daily.    Historical Provider, MD  atorvastatin (LIPITOR) 10 MG tablet Take 1 tablet (10 mg total) by mouth daily. 12/06/13   [name], MD  calcium carbonate (OS-CAL) 600 MG TABS tablet Take 600 mg by mouth daily with breakfast.    Historical Provider, MD  Cholecalciferol (VITAMIN D3) 3000 UNITS TABS Take 1 capsule by mouth daily.    Historical Provider, MD  cyclobenzaprine (FLEXERIL) 10 MG tablet Take 1 tablet (10 mg total) by mouth 3 (  three) times daily as needed for muscle spasms. 11/11/13   [name], MD  HYDROcodone-acetaminophen (NORCO/VICODIN) 5-325 MG per tablet Take 1 tablet by mouth every 6 (six) hours as needed for moderate pain. 11/11/13   [name], MD  olmesartan (BENICAR) 20 MG tablet Take 1 tablet (20 mg total) by mouth daily. 12/06/13   [name], MD  omeprazole (PRILOSEC) 40 MG capsule TAKE ONE CAPSULE BY MOUTH DAILY 12/30/13   [name], MD  traZODone (DESYREL) 100 MG tablet TAKE 1 TABLET BY MOUTH EVERY NIGHT AT BEDTIME 11/24/13   [name], MD   BP 121/69  Pulse 82  Temp(Src) 98.8 F (37.1 C) (Oral)  Resp 16  SpO2 97% Physical Exam  Nursing note and vitals reviewed. Constitutional: She appears  well-developed and well-nourished. No distress.  HENT:  Head: Atraumatic.  Eyes: Conjunctivae are normal.  Neck: Neck supple.  Cardiovascular: Normal rate and regular rhythm.   Pulmonary/Chest: Effort normal and breath sounds normal. No respiratory distress. She has no wheezes.  Abdominal: Soft. Bowel sounds are normal. She exhibits no distension. There is no tenderness.  Genitourinary:  Chaperone present:  Patient with external hemorrhoid, non-thrombosed. Normal rectal tone, no fecal impaction, no mass, no bright red blood per rectum.  Has black stool  Neurological: She is alert.  Skin: No rash noted.  Psychiatric: She has a normal mood and affect.    ED Course  Procedures (including critical care time)  12:34 PM Pt with black stool after taking Peptol bismol for constipation.  She has a non surgical abdomen, no epigastric pain to suggest upper GI bleed.  Is afebrile, VSS.  Doubt anemia but will check basic labs.    1:34 PM Fecal hemoccult with only a speck of blood although the stool card was filled with stool.  i suspect pt does not have GI bleed.  Reassurance given.  Recommend pt to stop taking bepto bismo, watchful waiting.  Pt has GI specialist she can also f/u . Return precaution discussed including lightheadedness, hematochezia, abd pain.    Labs Review Labs Reviewed  POC OCCULT BLOOD, ED - Abnormal; Notable for the following:    Fecal Occult Bld POSITIVE (*)    All other components within normal limits  I-STAT CHEM 8, ED - Abnormal; Notable for the following:    Calcium, Ion 1.10 (*)    Hemoglobin 11.9 (*)    HCT 35.0 (*)    All other components within normal limits    Imaging Review No results found.   EKG Interpretation None      MDM   Final diagnoses:  Stool color black    BP 120/55  Pulse 66  Temp(Src) 98.3 F (36.8 C) (Oral)  Resp 18  SpO2 99%     Domenic Moras, PA-C 01/20/14 1105

## 2014-01-19 NOTE — Telephone Encounter (Signed)
Patient Information:  Caller Name: Tracy [name]  Phone: [phone]  Patient: Tracy, [name]  Gender: Female  DOB: [birth year]  Age: 68 Years  PCP: [name].  Office Follow Up:  Does the office need to follow up with this patient?: No  Instructions For The Office: N/A  RN Note:  Per disposition contacted the office and spoke with Katharine Look, advised to refer pt to ED for eval.   Symptoms  Reason For Call & Symptoms: Pt states she has black stool and nausea.  Reviewed Health History In EMR: Yes  Reviewed Medications In EMR: Yes  Reviewed Allergies In EMR: Yes  Reviewed Surgeries / Procedures: Yes  Date of Onset of Symptoms: 01/19/2014  Guideline(s) Used:  Rectal Bleeding  Disposition Per Guideline:   Go to ED Now (or to Office with PCP Approval)  Reason For Disposition Reached:   Bloody, black, or tarry bowel movements  Advice Given:  Call Back If:  You become worse.  Patient Will Follow Care Advice:  YES

## 2014-01-19 NOTE — Discharge Instructions (Signed)
Your dark stool color is likely caused by pepto bismo.  Please stop taking the medication and closely monitor your stool color for the next few days.  Follow up with your doctor . Return if you developed lightheadedness, dizziness, abdominal pain, or bright red blood per rectum.  Follow up with GI specialist.

## 2014-01-20 NOTE — ED Provider Notes (Signed)
Medical screening examination/treatment/procedure(s) were performed by non-physician practitioner and as supervising physician I was immediately available for consultation/collaboration.   EKG Interpretation None        Maudry Diego, MD 01/20/14 1109

## 2014-02-20 ENCOUNTER — Other Ambulatory Visit: Payer: Self-pay | Admitting: Family Medicine

## 2014-02-21 NOTE — Telephone Encounter (Signed)
Med filled.  

## 2014-03-21 ENCOUNTER — Other Ambulatory Visit: Payer: Self-pay | Admitting: Family Medicine

## 2014-03-21 NOTE — Telephone Encounter (Signed)
Med filled.  

## 2014-05-03 ENCOUNTER — Other Ambulatory Visit: Payer: Self-pay | Admitting: Family Medicine

## 2014-05-03 NOTE — Telephone Encounter (Signed)
Med filled.  

## 2014-05-10 ENCOUNTER — Ambulatory Visit (INDEPENDENT_AMBULATORY_CARE_PROVIDER_SITE_OTHER): Payer: Medicare Other | Admitting: Physician Assistant

## 2014-05-10 ENCOUNTER — Encounter: Payer: Self-pay | Admitting: Physician Assistant

## 2014-05-10 VITALS — BP 148/66 | HR 76 | Temp 99.0°F | Resp 16 | Ht 60.0 in | Wt 198.0 lb

## 2014-05-10 DIAGNOSIS — Z1239 Encounter for other screening for malignant neoplasm of breast: Secondary | ICD-10-CM

## 2014-05-10 DIAGNOSIS — Z136 Encounter for screening for cardiovascular disorders: Secondary | ICD-10-CM

## 2014-05-10 DIAGNOSIS — G2581 Restless legs syndrome: Secondary | ICD-10-CM

## 2014-05-10 DIAGNOSIS — Z131 Encounter for screening for diabetes mellitus: Secondary | ICD-10-CM | POA: Diagnosis not present

## 2014-05-10 DIAGNOSIS — E785 Hyperlipidemia, unspecified: Secondary | ICD-10-CM

## 2014-05-10 DIAGNOSIS — Z0000 Encounter for general adult medical examination without abnormal findings: Secondary | ICD-10-CM

## 2014-05-10 DIAGNOSIS — Z23 Encounter for immunization: Secondary | ICD-10-CM

## 2014-05-10 LAB — LIPID PANEL
CHOLESTEROL: 154 mg/dL (ref 0–200)
HDL: 56.7 mg/dL (ref >39.00)
LDL CALC: 79 mg/dL (ref 0–99)
NonHDL: 97.3
TRIGLYCERIDES: 92 mg/dL (ref 0.0–149.0)
Total CHOL/HDL Ratio: 3
VLDL: 18.4 mg/dL (ref 0.0–40.0)

## 2014-05-10 LAB — HEPATIC FUNCTION PANEL
ALBUMIN: 3.2 g/dL — AB (ref 3.5–5.2)
ALT: 15 U/L (ref 0–35)
AST: 22 U/L (ref 0–37)
Alkaline Phosphatase: 86 U/L (ref 39–117)
BILIRUBIN TOTAL: 0.4 mg/dL (ref 0.2–1.2)
Bilirubin, Direct: 0 mg/dL (ref 0.0–0.3)
Total Protein: 8 g/dL (ref 6.0–8.3)

## 2014-05-10 LAB — FERRITIN: FERRITIN: 5.5 ng/mL — AB (ref 10.0–291.0)

## 2014-05-10 LAB — HEMOGLOBIN A1C: HEMOGLOBIN A1C: 7.1 % — AB (ref 4.6–6.5)

## 2014-05-10 NOTE — Progress Notes (Signed)
Subjective:    Tracy [name] is a 68 y.o. female who presents for Medicare Annual/Subsequent preventive examination.  Preventive Screening-Counseling & Management  Tobacco History  Smoking status  . Former Smoker  Smokeless tobacco  . Not on file    Comment: Quit >4 years ago     Past Medical History  Diagnosis Date  . Arthritis   . Hypertension   . Chronic bronchitis   . Myocardial infarction 2001  . Stroke   . Cancer 2000    breast cancer     Current Problems (verified) Patient Active Problem List   Diagnosis Date Noted  . Other and unspecified hyperlipidemia 12/06/2013  . GERD (gastroesophageal reflux disease) 12/06/2013  . Severe headache 11/11/2013  . Cervical disc disease 11/11/2013  . Osteopenia 05/25/2013  . Nausea alone 05/10/2013  . Allergic asthma 12/24/2012  . Insomnia 04/02/2012  . Physical exam, annual 04/02/2012  . Cramps, muscle, general 02/25/2012  . Chest pain 02/25/2012  . Sinusitis acute 02/19/2012  . HTN (hypertension) 02/03/2012  . Vertigo, benign positional 02/03/2012  . Breast cancer 02/03/2012  . Left groin pain 02/03/2012    Medications Prior to Visit Current Outpatient Prescriptions on File Prior to Visit  Medication Sig Dispense Refill  . albuterol (PROAIR HFA) 108 (90 BASE) MCG/ACT inhaler Inhale 2 puffs into the lungs every 4 (four) hours as needed for wheezing.  1 Inhaler  6  . aspirin 325 MG EC tablet Take 325 mg by mouth daily.      Marland Kitchen atorvastatin (LIPITOR) 10 MG tablet Take 1 tablet (10 mg total) by mouth daily.  30 tablet  6  . BENICAR 20 MG tablet TAKE 1 TABLET BY MOUTH DAILY  30 tablet  3  . calcium carbonate (OS-CAL) 600 MG TABS tablet Take 1,200 mg by mouth daily with breakfast.       . Cholecalciferol (VITAMIN D3) 3000 UNITS TABS Take 1 capsule by mouth daily.      . cyclobenzaprine (FLEXERIL) 10 MG tablet Take 1 tablet (10 mg total) by mouth 3 (three) times daily as needed for muscle spasms.  45 tablet  0  .  HYDROcodone-acetaminophen (NORCO/VICODIN) 5-325 MG per tablet Take 1 tablet by mouth every 6 (six) hours as needed for moderate pain.  30 tablet  0  . loratadine (CLARITIN) 10 MG tablet Take 10 mg by mouth daily.      Marland Kitchen omeprazole (PRILOSEC) 40 MG capsule Take 40 mg by mouth daily.      . traZODone (DESYREL) 100 MG tablet TAKE 1 TABLET BY MOUTH EVERY NIGHT AT BEDTIME  30 tablet  0   No current facility-administered medications on file prior to visit.    Current Medications (verified) Current Outpatient Prescriptions  Medication Sig Dispense Refill  . albuterol (PROAIR HFA) 108 (90 BASE) MCG/ACT inhaler Inhale 2 puffs into the lungs every 4 (four) hours as needed for wheezing.  1 Inhaler  6  . aspirin 325 MG EC tablet Take 325 mg by mouth daily.      Marland Kitchen atorvastatin (LIPITOR) 10 MG tablet Take 1 tablet (10 mg total) by mouth daily.  30 tablet  6  . BENICAR 20 MG tablet TAKE 1 TABLET BY MOUTH DAILY  30 tablet  3  . calcium carbonate (OS-CAL) 600 MG TABS tablet Take 1,200 mg by mouth daily with breakfast.       . Cholecalciferol (VITAMIN D3) 3000 UNITS TABS Take 1 capsule by mouth daily.      Marland Kitchen  cyclobenzaprine (FLEXERIL) 10 MG tablet Take 1 tablet (10 mg total) by mouth 3 (three) times daily as needed for muscle spasms.  45 tablet  0  . HYDROcodone-acetaminophen (NORCO/VICODIN) 5-325 MG per tablet Take 1 tablet by mouth every 6 (six) hours as needed for moderate pain.  30 tablet  0  . loratadine (CLARITIN) 10 MG tablet Take 10 mg by mouth daily.      Marland Kitchen omeprazole (PRILOSEC) 40 MG capsule Take 40 mg by mouth daily.      . traZODone (DESYREL) 100 MG tablet TAKE 1 TABLET BY MOUTH EVERY NIGHT AT BEDTIME  30 tablet  0   No current facility-administered medications for this visit.     Allergies (verified) Ambien; Amoxicillin; Contrast media; and Tape   PAST HISTORY  Family History Family History  Problem Relation Age of Onset  . Diabetes Father   . Cancer Sister   . Cancer Brother   .  Heart disease Brother   . Diabetes Brother   . Hypertension Brother   . Diabetes Paternal Aunt   . Stroke Maternal Grandmother   . Diabetes Paternal Grandmother     Social History History  Substance Use Topics  . Smoking status: Former Research scientist (life sciences)  . Smokeless tobacco: Not on file     Comment: Quit >4 years ago  . Alcohol Use: No    Are there smokers in your home (other than you)? No  Risk Factors Current exercise habits: The patient does not participate in regular exercise at present.  Dietary issues discussed: Well-balanced diet. Good water intake.   Cardiac risk factors: advanced age (older than 36 for men, 28 for women), dyslipidemia, hypertension, obesity (BMI >= 30 kg/m2) and sedentary lifestyle.  Depression Screen (Note: if answer to either of the following is "Yes", a more complete depression screening is indicated)   Over the past two weeks, have you felt down, depressed or hopeless? No  Over the past two weeks, have you felt little interest or pleasure in doing things? No  Have you lost interest or pleasure in daily life? No  Do you often feel hopeless? No  Do you cry easily over simple problems? No  Activities of Daily Living In your present state of health, do you have any difficulty performing the following activities?:  Driving? No Managing money?  No Feeding yourself? No Getting from bed to chair? No Climbing a flight of stairs? No Preparing food and eating?: No Bathing or showering? No Getting dressed: No Getting to the toilet? No Using the toilet:No Moving around from place to place: No In the past year have you fallen or had a near fall?:No   Are you sexually active?  No  Do you have more than one partner?  No  Hearing Difficulties: No Do you often ask people to speak up or repeat themselves? No Do you experience ringing or noises in your ears? No Do you have difficulty understanding soft or whispered voices? No  Do you feel that you have a problem  with memory? No  Do you often misplace items? No  Do you feel safe at home?  Yes  Cognitive Testing  Alert? Yes  Normal Appearance?Yes  Oriented to person? Yes  Place? Yes   Time? Yes  Recall of three objects?  Yes  Can perform simple calculations? Yes  Displays appropriate judgment?Yes  Can read the correct time from a watch face?Yes  List the Names of Other Physician/Practitioners you currently use: 1.  Dr. Belenda Cruise  [name] -- Primary Care 2.  Dr. Leta Baptist -- Neurology 3.  [facility] -- Orthopedic Surgery  Indicate any recent Medical Services you may have received from other than [facility] providers in the past year (date may be approximate).  Immunization History  Administered Date(s) Administered  . Influenza Whole 04/22/2013  . Influenza-Unspecified 05/03/2014  . Td 04/05/2013    Screening Tests Health Maintenance  Topic Date Due  . Zostavax  03/27/2004  . Pneumococcal Polysaccharide Vaccine Age 66 And Over  03/27/2009  . Influenza Vaccine  02/26/2014  . Mammogram  05/04/2015  . Colonoscopy  07/29/2020  . Tetanus/tdap  04/06/2023   All answers were reviewed with the patient and necessary referrals were made:  Leeanne Rio, PA-C   05/10/2014   History reviewed: allergies, current medications, past family history, past medical history, past social history, past surgical history and problem list  Review of Systems A comprehensive review of systems was negative except for: Neurological: positive for restless leg symptoms Behavioral/Psych: positive for anxiety    Objective:     Vision by Snellen chart: right eye:20/25, left eye:20/30  Body mass index is 38.67 kg/(m^2). BP 156/58  Pulse 76  Temp(Src) 99 F (37.2 C) (Oral)  Resp 16  Ht 5' (1.524 m)  Wt 198 lb (89.812 kg)  BMI 38.67 kg/m2  SpO2 98%  BP 148/66  Pulse 76  Temp(Src) 99 F (37.2 C) (Oral)  Resp 16  Ht 5' (1.524 m)  Wt 198 lb (89.812 kg)  BMI 38.67 kg/m2  SpO2  98%  General Appearance:    Alert, cooperative, no distress, appears stated age  Head:    Normocephalic, without obvious abnormality, atraumatic  Eyes:    PERRL, conjunctiva/corneas clear, EOM's intact, fundi    benign, both eyes  Ears:    Normal TM's and external ear canals, both ears  Nose:   Nares normal, septum midline, mucosa normal, no drainage    or sinus tenderness  Throat:   Lips, mucosa, and tongue normal; teeth and gums normal  Neck:   Supple, symmetrical, trachea midline, no adenopathy;    thyroid:  no enlargement/tenderness/nodules; no carotid   bruit or JVD  Back:     Symmetric, no curvature, ROM normal, no CVA tenderness  Lungs:     Clear to auscultation bilaterally, respirations unlabored  Chest Wall:    No tenderness or deformity   Heart:    Regular rate and rhythm, S1 and S2 normal, no murmur, rub   or gallop  Abdomen:     Soft, non-tender, bowel sounds active all four quadrants,    no masses, no organomegaly  Extremities:   Extremities normal, atraumatic, no cyanosis or edema  Pulses:   2+ and symmetric all extremities  Skin:   Skin color, texture, turgor normal, no rashes or lesions  Lymph nodes:   Cervical, supraclavicular, and axillary nodes normal  Neurologic:   CNII-XII intact, normal strength, sensation and reflexes    throughout       Assessment:     (1) Medicare Wellness, Subsequent (2) Hypertension (3) Screening for Ischemic Heart Disease (4) Restless Leg Syndrome      Plan:     (1) During the course of the visit the patient was educated and counseled about appropriate screening and preventive services including:    Pneumococcal vaccine   Influenza vaccine  Screening electrocardiogram  Screening mammography  Bone densitometry screening  Colorectal cancer screening  Diabetes screening  Nutrition counseling   (2)  Well controlled at present.  Continue current regimen. (3) EKG reveals normal sinus rhythm.  No concerning findings.  Will obtain fasting lipid panel today. (5) Referral placed again to Neurology for insurance purposes.  Recommended iron level check due to potential cause of symptoms.  Patient agreeable.  Lab ordered.  Patient Instructions (the written plan) was given to the patient.  Medicare Attestation I have personally reviewed: The patient's medical and social history Their use of alcohol, tobacco or illicit drugs Their current medications and supplements The patient's functional ability including ADLs,fall risks, home safety risks, cognitive, and hearing and visual impairment Diet and physical activities Evidence for depression or mood disorders  The patient's weight, height, BMI, and visual acuity have been recorded in the chart.  I have made referrals, counseling, and provided education to the patient based on review of the above and I have provided the patient with a written personalized care plan for preventive services.     Raiford Noble [place], [place]   05/10/2014

## 2014-05-10 NOTE — Patient Instructions (Addendum)
Please continue medications as directed.  You will be contacted by your Neurologist for an office visit. Follow-up with Dr. Birdie Riddle as directed.  Fat and Cholesterol Control Diet Fat and cholesterol levels in your blood and organs are influenced by your diet. High levels of fat and cholesterol may lead to diseases of the heart, small and large blood vessels, gallbladder, liver, and pancreas. CONTROLLING FAT AND CHOLESTEROL WITH DIET Although exercise and lifestyle factors are important, your diet is key. That is because certain foods are known to raise cholesterol and others to lower it. The goal is to balance foods for their effect on cholesterol and more importantly, to replace saturated and trans fat with other types of fat, such as monounsaturated fat, polyunsaturated fat, and omega-3 fatty acids. On average, a person should consume no more than 15 to 17 g of saturated fat daily. Saturated and trans fats are considered "bad" fats, and they will raise LDL cholesterol. Saturated fats are primarily found in animal products such as meats, butter, and cream. However, that does not mean you need to give up all your favorite foods. Today, there are good tasting, low-fat, low-cholesterol substitutes for most of the things you like to eat. Choose low-fat or nonfat alternatives. Choose round or loin cuts of red meat. These types of cuts are lowest in fat and cholesterol. Chicken (without the skin), fish, veal, and ground [place] breast are great choices. Eliminate fatty meats, such as hot dogs and salami. Even shellfish have little or no saturated fat. Have a 3 oz (85 g) portion when you eat lean meat, poultry, or fish. Trans fats are also called "partially hydrogenated oils." They are oils that have been scientifically manipulated so that they are solid at room temperature resulting in a longer shelf life and improved taste and texture of foods in which they are added. Trans fats are found in stick margarine, some  tub margarines, cookies, crackers, and baked goods.  When baking and cooking, oils are a great substitute for butter. The monounsaturated oils are especially beneficial since it is believed they lower LDL and raise HDL. The oils you should avoid entirely are saturated tropical oils, such as coconut and palm.  Remember to eat a lot from food groups that are naturally free of saturated and trans fat, including fish, fruit, vegetables, beans, grains (barley, rice, couscous, bulgur wheat), and pasta (without cream sauces).  IDENTIFYING FOODS THAT LOWER FAT AND CHOLESTEROL  Soluble fiber may lower your cholesterol. This type of fiber is found in fruits such as apples, vegetables such as broccoli, potatoes, and carrots, legumes such as beans, peas, and lentils, and grains such as barley. Foods fortified with plant sterols (phytosterol) may also lower cholesterol. You should eat at least 2 g per day of these foods for a cholesterol lowering effect.  Read package labels to identify low-saturated fats, trans fat free, and low-fat foods at the supermarket. Select cheeses that have only 2 to 3 g saturated fat per ounce. Use a heart-healthy tub margarine that is free of trans fats or partially hydrogenated oil. When buying baked goods (cookies, crackers), avoid partially hydrogenated oils. Breads and muffins should be made from whole grains (whole-wheat or whole oat flour, instead of "flour" or "enriched flour"). Buy non-creamy canned soups with reduced salt and no added fats.  FOOD PREPARATION TECHNIQUES  Never deep-fry. If you must fry, either stir-fry, which uses very little fat, or use non-stick cooking sprays. When possible, broil, bake, or roast meats, and  steam vegetables. Instead of putting butter or margarine on vegetables, use lemon and herbs, applesauce, and cinnamon (for squash and sweet potatoes). Use nonfat yogurt, salsa, and low-fat dressings for salads.  LOW-SATURATED FAT / LOW-FAT FOOD  SUBSTITUTES Meats / Saturated Fat (g)  Avoid: Steak, marbled (3 oz/85 g) / 11 g  Choose: Steak, lean (3 oz/85 g) / 4 g  Avoid: Hamburger (3 oz/85 g) / 7 g  Choose: Hamburger, lean (3 oz/85 g) / 5 g  Avoid: Ham (3 oz/85 g) / 6 g  Choose: Ham, lean cut (3 oz/85 g) / 2.4 g  Avoid: Chicken, with skin, dark meat (3 oz/85 g) / 4 g  Choose: Chicken, skin removed, dark meat (3 oz/85 g) / 2 g  Avoid: Chicken, with skin, light meat (3 oz/85 g) / 2.5 g  Choose: Chicken, skin removed, light meat (3 oz/85 g) / 1 g Dairy / Saturated Fat (g)  Avoid: Whole milk (1 cup) / 5 g  Choose: Low-fat milk, 2% (1 cup) / 3 g  Choose: Low-fat milk, 1% (1 cup) / 1.5 g  Choose: Skim milk (1 cup) / 0.3 g  Avoid: Hard cheese (1 oz/28 g) / 6 g  Choose: Skim milk cheese (1 oz/28 g) / 2 to 3 g  Avoid: Cottage cheese, 4% fat (1 cup) / 6.5 g  Choose: Low-fat cottage cheese, 1% fat (1 cup) / 1.5 g  Avoid: Ice cream (1 cup) / 9 g  Choose: Sherbet (1 cup) / 2.5 g  Choose: Nonfat frozen yogurt (1 cup) / 0.3 g  Choose: Frozen fruit bar / trace  Avoid: Whipped cream (1 tbs) / 3.5 g  Choose: Nondairy whipped topping (1 tbs) / 1 g Condiments / Saturated Fat (g)  Avoid: Mayonnaise (1 tbs) / 2 g  Choose: Low-fat mayonnaise (1 tbs) / 1 g  Avoid: Butter (1 tbs) / 7 g  Choose: Extra light margarine (1 tbs) / 1 g  Avoid: Coconut oil (1 tbs) / 11.8 g  Choose: Olive oil (1 tbs) / 1.8 g  Choose: Corn oil (1 tbs) / 1.7 g  Choose: Safflower oil (1 tbs) / 1.2 g  Choose: Sunflower oil (1 tbs) / 1.4 g  Choose: Soybean oil (1 tbs) / 2.4 g  Choose: Canola oil (1 tbs) / 1 g Document Released: 07/15/2005 Document Revised: 11/09/2012 Document Reviewed: 10/13/2013 ExitCare Patient Information 2015 [place], [place]. This information is not intended to replace advice given to you by your health care provider. Make sure you discuss any questions you have with your health care provider.  Hearing Loss A  hearing loss is sometimes called deafness. Hearing loss may be partial or total. CAUSES Hearing loss may be caused by:  Wax in the ear canal.  Infection of the ear canal.  Infection of the middle ear.  Trauma to the ear or surrounding area.  Fluid in the middle ear.  A hole in the eardrum (perforated eardrum).  Exposure to loud sounds or music.  Problems with the hearing nerve.  Certain medications. Hearing loss without wax, infection, or a history of injury may mean that the nerve is involved. Hearing loss with severe dizziness, nausea and vomiting or ringing in the ear may suggest a hearing nerve irritation or problems in the middle or inner ear. If hearing loss is untreated, there is a greater likelihood for residual or permanent hearing loss. DIAGNOSIS A hearing test (audiometry) assesses hearing loss. The audiometry test needs to be performed  by a hearing specialist (audiologist). TREATMENT Treatment for recent onset of hearing loss may include:  Ear wax removal.  Medications that kill germs (antibiotics).  Cortisone medications.  Prompt follow up with the appropriate specialist. Return of hearing depends on the cause of your hearing loss, so proper medical follow-up is important. Some hearing loss may not be reversible, and a caregiver should discuss care and treatment options with you. SEEK MEDICAL CARE IF:   You have a severe headache, dizziness, or changes in vision.  You have new or increased weakness.  You develop repeated vomiting or other serious medical problems.  You have a fever. Document Released: 07/15/2005 Document Revised: 10/07/2011 Document Reviewed: 11/09/2009 [facility] Patient Information 2015 [place], [place]. This information is not intended to replace advice given to you by your health care provider. Make sure you discuss any questions you have with your health care provider.  Thank you for enrolling in [place]. Please follow the instructions  below to securely access your online medical record. MyChart allows you to send messages to your doctor, view your test results, manage appointments, and more.   How Do I Sign Up? 1. In your Internet browser, go to [employer] and enter [email or web address]. 2. Click on the Sign Up Now link in the Sign In box. You will see the New Member Sign Up page. 3. Enter your MyChart Access Code exactly as it appears below. You will not need to use this code after you've completed the sign-up process. If you do not sign up before the expiration date, you must request a new code.  MyChart Access Code: [email or web address] Expires: 07/09/2014 11:24 AM  4. Enter your Social Security Number ([id number]) and Date of Birth (mm/dd/yyyy) as indicated and click Submit. You will be taken to the next sign-up page. 5. Create a MyChart ID. This will be your MyChart login ID and cannot be changed, so think of one that is secure and easy to remember. 6. Create a MyChart password. You can change your password at any time. 7. Enter your Password Reset Question and Answer. This can be used at a later time if you forget your password.  8. Enter your e-mail address. You will receive e-mail notification when new information is available in [place]. 9. Click Sign Up. You can now view your medical record.   Additional Information Remember, MyChart is NOT to be used for urgent needs. For medical emergencies, dial 911.

## 2014-05-10 NOTE — Progress Notes (Signed)
Pre visit review using our clinic review tool, if applicable. No additional management support is needed unless otherwise documented below in the visit note/SLS  

## 2014-05-12 LAB — BASIC METABOLIC PANEL WITH GFR
BUN: 9 mg/dL (ref 6–23)
CHLORIDE: 104 meq/L (ref 96–112)
CO2: 23 meq/L (ref 19–32)
Calcium: 9.3 mg/dL (ref 8.4–10.5)
Creat: 0.95 mg/dL (ref 0.50–1.10)
GFR, Est African American: 70 mL/min
GFR, Est Non African American: 61 mL/min
GLUCOSE: 97 mg/dL (ref 70–99)
POTASSIUM: 4.5 meq/L (ref 3.5–5.3)
SODIUM: 141 meq/L (ref 135–145)

## 2014-05-16 ENCOUNTER — Telehealth: Payer: Self-pay | Admitting: Family Medicine

## 2014-05-16 NOTE — Telephone Encounter (Signed)
Caller name: Kamarie, Veno Relation to pt: self  Call back number: [phone]   Reason for call:   Pt stated she spoke with someone this morning regarding lab work does not remember who it was and she was half asleep. Requesting another phone call in regards to her lab results.

## 2014-05-19 ENCOUNTER — Ambulatory Visit (INDEPENDENT_AMBULATORY_CARE_PROVIDER_SITE_OTHER): Payer: Medicare Other | Admitting: Family Medicine

## 2014-05-19 ENCOUNTER — Encounter: Payer: Self-pay | Admitting: Family Medicine

## 2014-05-19 VITALS — BP 132/74 | HR 86 | Temp 98.6°F | Resp 16 | Wt 197.5 lb

## 2014-05-19 DIAGNOSIS — Z9109 Other allergy status, other than to drugs and biological substances: Secondary | ICD-10-CM

## 2014-05-19 DIAGNOSIS — E119 Type 2 diabetes mellitus without complications: Secondary | ICD-10-CM

## 2014-05-19 DIAGNOSIS — Z91048 Other nonmedicinal substance allergy status: Secondary | ICD-10-CM | POA: Insufficient documentation

## 2014-05-19 MED ORDER — HYDROCODONE-ACETAMINOPHEN 5-325 MG PO TABS: 1.0000 | ORAL_TABLET | Freq: Four times a day (QID) | ORAL | Status: DC | PRN

## 2014-05-19 MED ORDER — TRIAMCINOLONE ACETONIDE 0.1 % EX OINT: 1.0000 "application " | TOPICAL_OINTMENT | Freq: Two times a day (BID) | CUTANEOUS | Status: DC

## 2014-05-19 NOTE — Progress Notes (Signed)
   Subjective:    Patient ID: Tracy [name], female    DOB: [birth year], 68 y.o.   MRN: [id number]  HPI Adhesive allergy- pt got a flu shot on 10/13 and told employee not to place bandaid due to allergy.  By the time pt was in the lab, she had itching, redness, swelling.  Pt had reaction until 10/18 when 'it started drying up'.  The only relief was to rinse and wash w/ Dove.  No relief w/ benadryl cream.  Still has perfect bandaid scab at site.  Continuous to itch and have pain.  DM- new dx.  Pt's recent A1C 7.1  Pt has strong family hx and reports 'everyone' in father's family died of diabetic complications.  Does not have eye doctor.  On ARB.  Pt reports she does have a 'sweet tooth'.     Review of Systems For ROS see HPI     Objective:   Physical Exam  Vitals reviewed. Constitutional: She is oriented to person, place, and time. She appears well-developed and well-nourished. No distress.  HENT:  Head: Normocephalic and atraumatic.  Eyes: Conjunctivae and EOM are normal. Pupils are equal, round, and reactive to light.  Neck: Normal range of motion. Neck supple.  Cardiovascular: Normal rate, regular rhythm, normal heart sounds and intact distal pulses.   Pulmonary/Chest: Effort normal and breath sounds normal. No respiratory distress. She has no wheezes. She has no rales.  Musculoskeletal: She exhibits no edema.  Neurological: She is alert and oriented to person, place, and time.  Skin: Skin is warm and dry.  Pt w/ perfect bandaid shaped scab consistent w/ chemical burn from known adhesive allergy          Assessment & Plan:

## 2014-05-19 NOTE — Assessment & Plan Note (Signed)
New dx.  A1C 7.1  Pt very nervous about dx due to family hx of severe complications and death.  Discussed need for tight BP control, ARB use (already on), yearly eye exams- referral made, tight lipid control (already there).  Also reviewed definition of carbohydrate, need for low carb diet and regular exercise.  Will refer to diabetic education.  No need for meds at this time time- will allow pt time to make lifestyle changes.  Will follow closely.

## 2014-05-19 NOTE — Progress Notes (Signed)
Pre visit review using our clinic review tool, if applicable. No additional management support is needed unless otherwise documented below in the visit note. 

## 2014-05-19 NOTE — Patient Instructions (Signed)
Follow up in 3-4 months to recheck diabetes Apply the triamcinolone ointment to the arm twice daily until the area heals We'll call you with your appt at the nutrition center Try and limit your carb intake- sugars, rice, pasta, bread, potatoes If craving sweets, try fruit! We'll call you with your eye exam Try and make healthy food choices and get regular exercise Call with any questions or concerns- there are no stupid questions Hang in there!!

## 2014-05-19 NOTE — Assessment & Plan Note (Signed)
Pt w/ reaction to band aid applied after immunization given on 10/13.  Pt reports area is starting to improve but still itchy and painful.  Start triamcinolone ointment topically BID.

## 2014-05-20 ENCOUNTER — Encounter: Payer: Self-pay | Admitting: Family Medicine

## 2014-05-26 ENCOUNTER — Ambulatory Visit
Admission: RE | Admit: 2014-05-26 | Discharge: 2014-05-26 | Disposition: A | Discharge status: Home / Self Care | Payer: Medicare Other | Source: Ambulatory Visit | Attending: Physician Assistant | Admitting: Physician Assistant

## 2014-05-26 ENCOUNTER — Other Ambulatory Visit: Payer: Self-pay | Admitting: Family Medicine

## 2014-05-26 ENCOUNTER — Other Ambulatory Visit: Payer: Self-pay | Admitting: Physician Assistant

## 2014-05-26 ENCOUNTER — Ambulatory Visit: Payer: Medicare Other

## 2014-05-26 DIAGNOSIS — Z1231 Encounter for screening mammogram for malignant neoplasm of breast: Secondary | ICD-10-CM

## 2014-06-03 ENCOUNTER — Other Ambulatory Visit: Payer: Self-pay | Admitting: Family Medicine

## 2014-06-03 NOTE — Telephone Encounter (Signed)
Med filled.  

## 2014-07-07 ENCOUNTER — Ambulatory Visit: Payer: Medicare Other

## 2014-07-14 ENCOUNTER — Other Ambulatory Visit: Payer: Self-pay | Admitting: Family Medicine

## 2014-07-14 ENCOUNTER — Ambulatory Visit: Payer: Medicare Other

## 2014-07-14 NOTE — Telephone Encounter (Signed)
Med filled.  

## 2014-07-20 ENCOUNTER — Ambulatory Visit: Payer: Medicare Other

## 2014-07-21 ENCOUNTER — Ambulatory Visit: Payer: Medicare Other

## 2014-07-21 ENCOUNTER — Other Ambulatory Visit: Payer: Self-pay | Admitting: General Practice

## 2014-07-21 MED ORDER — ATORVASTATIN CALCIUM 10 MG PO TABS: 10.0000 mg | ORAL_TABLET | Freq: Every day | ORAL | Status: DC

## 2014-07-21 MED ORDER — OLMESARTAN MEDOXOMIL 20 MG PO TABS: 20.0000 mg | ORAL_TABLET | Freq: Every day | ORAL | Status: DC

## 2014-07-29 HISTORY — PX: BREAST LUMPECTOMY: SHX2

## 2014-08-01 DIAGNOSIS — M25552 Pain in left hip: Secondary | ICD-10-CM | POA: Diagnosis not present

## 2014-08-01 DIAGNOSIS — M533 Sacrococcygeal disorders, not elsewhere classified: Secondary | ICD-10-CM | POA: Diagnosis not present

## 2014-08-04 ENCOUNTER — Other Ambulatory Visit: Payer: Self-pay | Admitting: Family Medicine

## 2014-08-05 MED ORDER — OLMESARTAN MEDOXOMIL 20 MG PO TABS: 20.0000 mg | ORAL_TABLET | Freq: Every day | ORAL | Status: DC

## 2014-08-05 NOTE — Telephone Encounter (Signed)
Confirmed Benicar refill with patient, she stated she was unable to receive it from the mail order company so she wanted to sent to [facility], I asked did she wanted generic or Brand because the request was for Brand and she said she wanted the Benicar and also want 90 days. Rx faxed.      KP

## 2014-08-16 DIAGNOSIS — M533 Sacrococcygeal disorders, not elsewhere classified: Secondary | ICD-10-CM | POA: Diagnosis not present

## 2014-08-19 ENCOUNTER — Ambulatory Visit: Payer: Medicare Other | Admitting: Family Medicine

## 2014-08-25 ENCOUNTER — Encounter: Payer: Self-pay | Admitting: Family Medicine

## 2014-08-25 ENCOUNTER — Ambulatory Visit (INDEPENDENT_AMBULATORY_CARE_PROVIDER_SITE_OTHER): Payer: Medicare Other | Admitting: Family Medicine

## 2014-08-25 VITALS — BP 130/84 | HR 83 | Temp 98.6°F | Resp 16 | Wt 195.5 lb

## 2014-08-25 DIAGNOSIS — R0789 Other chest pain: Secondary | ICD-10-CM | POA: Diagnosis not present

## 2014-08-25 DIAGNOSIS — R1013 Epigastric pain: Secondary | ICD-10-CM | POA: Insufficient documentation

## 2014-08-25 DIAGNOSIS — E119 Type 2 diabetes mellitus without complications: Secondary | ICD-10-CM | POA: Diagnosis not present

## 2014-08-25 LAB — CBC WITH DIFFERENTIAL/PLATELET
BASOS ABS: 0 10*3/uL (ref 0.0–0.1)
Basophils Relative: 0.5 % (ref 0.0–3.0)
EOS PCT: 1 % (ref 0.0–5.0)
Eosinophils Absolute: 0.1 10*3/uL (ref 0.0–0.7)
HCT: 31.4 % — ABNORMAL LOW (ref 36.0–46.0)
Hemoglobin: 10.2 g/dL — ABNORMAL LOW (ref 12.0–15.0)
Lymphocytes Relative: 36.3 % (ref 12.0–46.0)
Lymphs Abs: 3.3 10*3/uL (ref 0.7–4.0)
MCHC: 32.5 g/dL (ref 30.0–36.0)
MCV: 86.6 fl (ref 78.0–100.0)
MONO ABS: 0.5 10*3/uL (ref 0.1–1.0)
Monocytes Relative: 5.4 % (ref 3.0–12.0)
NEUTROS ABS: 5.1 10*3/uL (ref 1.4–7.7)
Neutrophils Relative %: 56.8 % (ref 43.0–77.0)
Platelets: 300 10*3/uL (ref 150.0–400.0)
RBC: 3.62 Mil/uL — ABNORMAL LOW (ref 3.87–5.11)
RDW: 15.6 % — AB (ref 11.5–15.5)
WBC: 9 10*3/uL (ref 4.0–10.5)

## 2014-08-25 LAB — HEPATIC FUNCTION PANEL
ALT: 10 U/L (ref 0–35)
AST: 12 U/L (ref 0–37)
Albumin: 3.7 g/dL (ref 3.5–5.2)
Alkaline Phosphatase: 102 U/L (ref 39–117)
BILIRUBIN TOTAL: 0.2 mg/dL (ref 0.2–1.2)
Bilirubin, Direct: 0 mg/dL (ref 0.0–0.3)
TOTAL PROTEIN: 7.4 g/dL (ref 6.0–8.3)

## 2014-08-25 LAB — TROPONIN I: TNIDX: 0 ug/L (ref 0.00–0.06)

## 2014-08-25 LAB — BASIC METABOLIC PANEL
BUN: 10 mg/dL (ref 6–23)
CALCIUM: 8.8 mg/dL (ref 8.4–10.5)
CO2: 26 meq/L (ref 19–32)
Chloride: 103 mEq/L (ref 96–112)
Creatinine, Ser: 0.89 mg/dL (ref 0.40–1.20)
GFR: 80.54 mL/min (ref >60.00)
Glucose, Bld: 124 mg/dL — ABNORMAL HIGH (ref 70–99)
Potassium: 3.7 mEq/L (ref 3.5–5.1)
Sodium: 137 mEq/L (ref 135–145)

## 2014-08-25 LAB — HEMOGLOBIN A1C: Hgb A1c MFr Bld: 7.3 % — ABNORMAL HIGH (ref 4.6–6.5)

## 2014-08-25 LAB — H. PYLORI ANTIBODY, IGG: H Pylori IgG: NEGATIVE

## 2014-08-25 LAB — AMYLASE: Amylase: 50 U/L (ref 27–131)

## 2014-08-25 LAB — LIPASE: Lipase: 45 U/L (ref 11.0–59.0)

## 2014-08-25 MED ORDER — GI COCKTAIL ~~LOC~~
30.0000 mL | Freq: Once | ORAL | Status: AC
  Administered 2014-08-25: 30 mL via ORAL

## 2014-08-25 MED ORDER — VALSARTAN 160 MG PO TABS: 160.0000 mg | ORAL_TABLET | Freq: Every day | ORAL | Status: DC

## 2014-08-25 MED ORDER — OMEPRAZOLE 40 MG PO CPDR: 40.0000 mg | DELAYED_RELEASE_CAPSULE | Freq: Two times a day (BID) | ORAL | Status: DC

## 2014-08-25 NOTE — Assessment & Plan Note (Signed)
Chronic problem.  Pt has changed her diet and is now eating low carb.  Due for eye exam- had to reschedule due to illness. On ARB for renal protection.  Check labs.  Adjust meds prn

## 2014-08-25 NOTE — Progress Notes (Signed)
Pre visit review using our clinic review tool, if applicable. No additional management support is needed unless otherwise documented below in the visit note. 

## 2014-08-25 NOTE — Progress Notes (Signed)
   Subjective:    Patient ID: Tracy [name], female    DOB: 13-Feb-1946, 69 y.o.   MRN: [id number]  HPI DM- chronic problem, attempting to control w/ healthy diet and regular exercise.  Pt has lost 3 lbs since last visit.  On ARB for renal protection.  Due for eye exam- had to cancel last appt when daughter got sick.  Pt is now following low carb diet.  Chest pressure- pt reports she has a chest pressure under ribs bilaterally, radiating around to back.  'i feel the need to bring up something'.  + sour brash.  No relief on Omeprazole.  Increased to BID and sxs improved.  sxs started last week.  + nausea.  No vomiting.  Denies SOB.  No changes to bowel habits w/ exception of increased gas.   Review of Systems For ROS see HPI     Objective:   Physical Exam  Constitutional: She is oriented to person, place, and time. She appears well-developed and well-nourished. No distress.  obese  HENT:  Head: Normocephalic and atraumatic.  Eyes: Conjunctivae and EOM are normal. Pupils are equal, round, and reactive to light.  Neck: Normal range of motion. Neck supple. No thyromegaly present.  Cardiovascular: Normal rate, regular rhythm, normal heart sounds and intact distal pulses.   No murmur heard. Pulmonary/Chest: Effort normal and breath sounds normal. No respiratory distress.  Abdominal: Soft. Bowel sounds are normal. She exhibits no distension. There is no tenderness. There is no rebound.  Musculoskeletal: She exhibits no edema.  Lymphadenopathy:    She has no cervical adenopathy.  Neurological: She is alert and oriented to person, place, and time.  Skin: Skin is warm and dry.  Psychiatric: She has a normal mood and affect. Her behavior is normal.  Vitals reviewed.         Assessment & Plan:

## 2014-08-25 NOTE — Assessment & Plan Note (Signed)
New.  Pt reports pain improved w/ GI cocktail in office but chest tightness continued.  Reports pain improves at home when she doubles PPI.  Denies changes in diet or medications.  Check labs to r/o pancreatitis or biliary abnormality.  R/o H pylori infxn.  Increase PPI to BID.  If no improvement, will refer to GI.  Pt expressed understanding and is in agreement w/ plan.

## 2014-08-25 NOTE — Patient Instructions (Signed)
Follow up in 3-4 months to recheck diabetes and cholesterol We'll notify you of your lab results and make any changes if needed Increase the Omeprazole to twice daily If your chest pain changes or worsens- please call or go to the ER Call with any questions or concerns Hang in there!!

## 2014-08-25 NOTE — Assessment & Plan Note (Signed)
New to provider, recurrent problem for pt.  She doesn't feel this is cardiac in nature and neither do I- but must r/o.  Pain improved w/ GI cocktail.  EKG virtually unchanged from previous.  Check stat troponin.  Suspect this is GI/GERD related.  Increase PPI to BID.  R/o H pylori infxn.  Reviewed supportive care and red flags that should prompt return.  Pt expressed understanding and is in agreement w/ plan.

## 2014-08-26 ENCOUNTER — Other Ambulatory Visit: Payer: Self-pay | Admitting: General Practice

## 2014-08-26 DIAGNOSIS — R1013 Epigastric pain: Secondary | ICD-10-CM

## 2014-08-26 MED ORDER — SUCRALFATE 1 G PO TABS: 1.0000 g | ORAL_TABLET | Freq: Three times a day (TID) | ORAL | Status: DC

## 2014-08-30 ENCOUNTER — Ambulatory Visit (INDEPENDENT_AMBULATORY_CARE_PROVIDER_SITE_OTHER): Payer: Medicare Other | Admitting: Gastroenterology

## 2014-08-30 ENCOUNTER — Encounter: Payer: Self-pay | Admitting: Gastroenterology

## 2014-08-30 VITALS — BP 128/58 | HR 80 | Ht 60.0 in | Wt 196.4 lb

## 2014-08-30 DIAGNOSIS — R1013 Epigastric pain: Secondary | ICD-10-CM | POA: Diagnosis not present

## 2014-08-30 DIAGNOSIS — G8929 Other chronic pain: Secondary | ICD-10-CM | POA: Diagnosis not present

## 2014-08-30 MED ORDER — PREDNISONE 50 MG PO TABS: ORAL_TABLET | ORAL | Status: DC

## 2014-08-30 NOTE — Patient Instructions (Addendum)
You will be set up for a CT scan of abdomen and pelvis with IV and oral contrast for epigastric pain, radiating to back.  You have been scheduled for a CT scan of the abdomen and pelvis at [facility] ([address]---this is in the same building as Press [facility]).   You are scheduled on 09/01/14 at 130 pm. You should arrive 15 minutes prior to your appointment time for registration. Please follow the written instructions below on the day of your exam:  WARNING: IF YOU ARE ALLERGIC TO IODINE/X-RAY DYE, PLEASE NOTIFY RADIOLOGY IMMEDIATELY AT [phone]! YOU WILL BE GIVEN A 13 HOUR PREMEDICATION PREP.  1) Do not eat or drink anything after 930 am (4 hours prior to your test) 2) You have been given 2 bottles of oral contrast to drink. The solution may taste  better if refrigerated, but do NOT add ice or any other liquid to this solution. Shake well before drinking.    Drink 1 bottle of contrast @ 1130 am (2 hours prior to your exam)  Drink 1 bottle of contrast @ 1230 pm (1 hour prior to your exam)  You may take any medications as prescribed with a small amount of water except for the following: Metformin, Glucophage, Glucovance, Avandamet, Riomet, Fortamet, Actoplus Met, Janumet, Glumetza or Metaglip. The above medications must be held the day of the exam AND 48 hours after the exam.  The purpose of you drinking the oral contrast is to aid in the visualization of your intestinal tract. The contrast solution may cause some diarrhea. Before your exam is started, you will be given a small amount of fluid to drink. Depending on your individual set of symptoms, you may also receive an intravenous injection of x-ray contrast/dye. Plan on being at [facility] for 30 minutes or long, depending on the type of exam you are having performed.  This test typically takes 30-45 minutes to complete.  If you have any questions regarding your exam or if you need to reschedule, you may call  the CT department at [phone] between the hours of 8:00 am and 5:00 pm, Monday-Friday.  ________________________________________________________________________  Dennis Bast should change the way you are taking your antiacid medicine (omeprazole) so that you are taking it 20-30 minutes prior to breakfast and dinner meals as that is the way the pill is designed to work most effectively.  You will need to be pre medicated for this procedure due to your allergy to contrast dye. We have sent in medication for you to take as follows:   Prednisone 50 mg at 1230 am the night before, then 630 am, then 1230 pm, at 1230 pm also take 50 mg of Benadryl.

## 2014-08-30 NOTE — Progress Notes (Signed)
HPI: This is a  very pleasant 69 year old woman whom I am meeting for the first time today.  Epigastric pains that started about 2 weeks ago.  THe pain is just under ribs bilaterally and radiates around both sides to her back. The pains occurs with moving usually, can feel  A pressure all the time.  Has a burning feeling in her left breast.   Bending over, turning to right or left makes it hurt. Eating may improve it briefly.  NO real improvement with increasing omep to bid.  Ate some chicken noodle soup and seemed to cause dysphagia.  Caused nausea, vomited.  Had a lot of burning.  No nsaids.  Overall her weight is down, intentionally with diet since DM diagnosis.  Labs 07/2014 cbc (slight normocytic anemia, hb 10.2), CMET normal, lipase normal, H pylori serology negative.  Also her bowel have been changed, scibbolous.   Review of systems: Pertinent positive and negative review of systems were noted in the above HPI section. Complete review of systems was performed and was otherwise normal.    Past Medical History  Diagnosis Date  . Arthritis   . Hypertension   . Chronic bronchitis   . Myocardial infarction 2001  . Stroke   . Cancer 2000    breast cancer  . Diabetes mellitus without complication   . Hyperlipidemia     Past Surgical History  Procedure Laterality Date  . Breast surgery  2001  . Abdominal hysterectomy  1985  . Small intestine surgery      Current Outpatient Prescriptions  Medication Sig Dispense Refill  . albuterol (PROAIR HFA) 108 (90 BASE) MCG/ACT inhaler Inhale 2 puffs into the lungs every 4 (four) hours as needed for wheezing. 1 Inhaler 6  . aspirin 325 MG EC tablet Take 325 mg by mouth daily.    Marland Kitchen atorvastatin (LIPITOR) 10 MG tablet Take 1 tablet (10 mg total) by mouth daily. 90 tablet 1  . calcium carbonate (OS-CAL) 600 MG TABS tablet Take 1,200 mg by mouth daily with breakfast.     . Cholecalciferol (VITAMIN D3) 3000 UNITS TABS Take 1 capsule by  mouth daily.    . cyclobenzaprine (FLEXERIL) 10 MG tablet Take 1 tablet (10 mg total) by mouth 3 (three) times daily as needed for muscle spasms. 45 tablet 0  . HYDROcodone-acetaminophen (NORCO/VICODIN) 5-325 MG per tablet Take 1 tablet by mouth every 6 (six) hours as needed for moderate pain. 60 tablet 0  . loratadine (CLARITIN) 10 MG tablet Take 10 mg by mouth daily.    Marland Kitchen omeprazole (PRILOSEC) 40 MG capsule Take 1 capsule (40 mg total) by mouth 2 (two) times daily. 60 capsule 3  . sucralfate (CARAFATE) 1 G tablet Take 1 tablet (1 g total) by mouth 4 (four) times daily -  with meals and at bedtime. 90 tablet 3  . traZODone (DESYREL) 100 MG tablet TAKE 1 TABLET BY MOUTH EVERY NIGHT AT BEDTIME 30 tablet 3  . triamcinolone ointment (KENALOG) 0.1 % Apply 1 application topically 2 (two) times daily. 90 g 1  . valsartan (DIOVAN) 160 MG tablet Take 1 tablet (160 mg total) by mouth daily. 30 tablet 6   No current facility-administered medications for this visit.    Allergies as of 08/30/2014 - Review Complete 08/30/2014  Allergen Reaction Noted  . Tape Hives 11/07/2013  . Ambien [zolpidem tartrate]  04/02/2012  . Amoxicillin  02/03/2012  . Contrast media [iodinated diagnostic agents] Hives 11/06/2013    Family History  Problem Relation Age of Onset  . Diabetes Father   . Lung cancer Sister   . Colon cancer Brother   . Heart disease Brother   . Diabetes Brother   . Hypertension Brother   . Diabetes Paternal Aunt   . Stroke Maternal Grandmother   . Diabetes Paternal Grandmother   . Colon polyps Neg Hx   . Esophageal cancer Neg Hx   . Gallbladder disease Neg Hx     History   Social History  . Marital Status: Divorced    Spouse Name: N/A    Number of Children: 7  . Years of Education: N/A   Occupational History  . retired    Social History Main Topics  . Smoking status: Former Smoker -- 1.00 packs/day for 20 years    Types: Cigarettes  . Smokeless tobacco: Never Used      Comment: Quit >4 years ago  . Alcohol Use: No  . Drug Use: No  . Sexual Activity: Not on file   Other Topics Concern  . Not on file   Social History Narrative       Physical Exam: BP 128/58 mmHg  Pulse 80  Ht 5' (1.524 m)  Wt 196 lb 6 oz (89.075 kg)  BMI 38.35 kg/m2 Constitutional: generally well-appearing Psychiatric: alert and oriented x3 Eyes: extraocular movements intact Mouth: oral pharynx moist, no lesions Neck: supple no lymphadenopathy Cardiovascular: heart regular rate and rhythm Lungs: clear to auscultation bilaterally Abdomen: soft, very mildly tender in the epigastrium, nondistended, no obvious ascites, no peritoneal signs, normal bowel sounds Extremities: no lower extremity edema bilaterally Skin: no lesions on visible extremities    Assessment and plan: 69 y.o. female with  epigastric pain that radiates to her back, fairly positional  Is not clear to me if this is a GI problem given that it is fairly positional. She is slightly tender on exam however and with its radiation to the back and would like to proceed with CT scan first. If that shows no clear cause of her discomforts than EGD would be the next gastrointestinal test. I recommended she change slightly the way she is taking her proton pump inhibitors. See patient instructions.

## 2014-09-01 ENCOUNTER — Ambulatory Visit (INDEPENDENT_AMBULATORY_CARE_PROVIDER_SITE_OTHER)
Admission: RE | Admit: 2014-09-01 | Discharge: 2014-09-01 | Disposition: A | Discharge status: Home / Self Care | Payer: Medicare Other | Source: Ambulatory Visit | Attending: Gastroenterology | Admitting: Gastroenterology

## 2014-09-01 DIAGNOSIS — G8929 Other chronic pain: Secondary | ICD-10-CM | POA: Diagnosis not present

## 2014-09-01 DIAGNOSIS — R110 Nausea: Secondary | ICD-10-CM | POA: Diagnosis not present

## 2014-09-01 DIAGNOSIS — R1013 Epigastric pain: Secondary | ICD-10-CM

## 2014-09-01 DIAGNOSIS — Z853 Personal history of malignant neoplasm of breast: Secondary | ICD-10-CM | POA: Diagnosis not present

## 2014-09-01 MED ORDER — IOHEXOL 300 MG/ML  SOLN
100.0000 mL | Freq: Once | INTRAMUSCULAR | Status: AC | PRN
  Administered 2014-09-01: 100 mL via INTRAVENOUS

## 2014-09-05 ENCOUNTER — Encounter: Payer: Self-pay | Admitting: Medical

## 2014-09-05 ENCOUNTER — Encounter: Payer: Self-pay | Admitting: Gastroenterology

## 2014-09-05 ENCOUNTER — Ambulatory Visit (INDEPENDENT_AMBULATORY_CARE_PROVIDER_SITE_OTHER): Payer: Medicare Other | Admitting: Medical

## 2014-09-05 ENCOUNTER — Telehealth: Payer: Self-pay | Admitting: Family Medicine

## 2014-09-05 VITALS — BP 158/75 | HR 93 | Temp 98.8°F | Ht 61.0 in | Wt 198.2 lb

## 2014-09-05 DIAGNOSIS — R1013 Epigastric pain: Secondary | ICD-10-CM | POA: Diagnosis not present

## 2014-09-05 DIAGNOSIS — R0789 Other chest pain: Secondary | ICD-10-CM | POA: Diagnosis not present

## 2014-09-05 LAB — TROPONIN I: TNIDX: 0 ug/L (ref 0.00–0.06)

## 2014-09-05 NOTE — Telephone Encounter (Signed)
Caller name:Tracy [name] Relationship to patient:Self Can be reached:[phone]   Reason for call:PT states left arm is feeling very heavy - retaining water. Requesting RX be called in or a suggestion for over the counter med to assist

## 2014-09-05 NOTE — Assessment & Plan Note (Signed)
Some boated sensation and hx of Gerd. Continue omeprazole and take zantac 150 mg otc twice daily.

## 2014-09-05 NOTE — Progress Notes (Signed)
Subjective:    Patient ID: Tracy [name], female    DOB: 05-26-1946, 69 y.o.   MRN: [id number]  HPI   Pt had heavy sensation to shoulder and her left axillary area. Since this am. Pt has hx of breast ca and lymphadema to left upper extremity(she states arm swells occasionally but heavy sensation is not in her norm. Transient tingle sensation to chest. Some this am. Some lower rib pain on both sides of her lower chest. No sob, no sweating. Does admit to Mild MI years ago.   Light chest pain this moring transient.   Pt burps a lot and feels little bloated. Hx of gerd. Pt is on omeprazole. No constipation and no diarrhea. No nauseau, no vomiting, no fever, no blood in her stools.    Review of Systems  Constitutional: Negative for fever, chills, diaphoresis, activity change and fatigue.  Respiratory: Negative for cough, choking, chest tightness, shortness of breath and wheezing.   Cardiovascular: Positive for chest pain. Negative for palpitations and leg swelling.       Non now but atypical this am.  Gastrointestinal: Positive for abdominal pain. Negative for nausea and vomiting.  Musculoskeletal: Negative for neck pain and neck stiffness.       Lt shoulder heavy.  Neurological: Negative for dizziness, tremors, seizures, syncope, facial asymmetry, speech difficulty, weakness, light-headedness, numbness and headaches.  Psychiatric/Behavioral: Negative for behavioral problems, confusion and agitation. The patient is not nervous/anxious.    Past Medical History  Diagnosis Date  . Arthritis   . Hypertension   . Chronic bronchitis   . Myocardial infarction 2001  . Stroke   . Cancer 2000    breast cancer  . Diabetes mellitus without complication   . Hyperlipidemia     History   Social History  . Marital Status: Divorced    Spouse Name: N/A    Number of Children: 7  . Years of Education: N/A   Occupational History  . retired    Social History Main Topics  . Smoking status:  Former Smoker -- 1.00 packs/day for 20 years    Types: Cigarettes  . Smokeless tobacco: Never Used     Comment: Quit >4 years ago  . Alcohol Use: No  . Drug Use: No  . Sexual Activity: Not on file   Other Topics Concern  . Not on file   Social History Narrative    Past Surgical History  Procedure Laterality Date  . Breast surgery  2001  . Abdominal hysterectomy  1985  . Small intestine surgery      Family History  Problem Relation Age of Onset  . Diabetes Father   . Lung cancer Sister   . Colon cancer Brother   . Heart disease Brother   . Diabetes Brother   . Hypertension Brother   . Diabetes Paternal Aunt   . Stroke Maternal Grandmother   . Diabetes Paternal Grandmother   . Colon polyps Neg Hx   . Esophageal cancer Neg Hx   . Gallbladder disease Neg Hx     Allergies  Allergen Reactions  . Tape Hives    Pt cannot tolerate bandaids, tape, or any other adhesives.   . Ambien [Zolpidem Tartrate]     Hives   . Amoxicillin     Rash   . Contrast Media [Iodinated Diagnostic Agents] Hives    Pt states hives with prior ct, was given benadryl to resolve  . Prednisone     Current Outpatient Prescriptions  on File Prior to Visit  Medication Sig Dispense Refill  . albuterol (PROAIR HFA) 108 (90 BASE) MCG/ACT inhaler Inhale 2 puffs into the lungs every 4 (four) hours as needed for wheezing. 1 Inhaler 6  . atorvastatin (LIPITOR) 10 MG tablet Take 1 tablet (10 mg total) by mouth daily. 90 tablet 1  . calcium carbonate (OS-CAL) 600 MG TABS tablet Take 1,200 mg by mouth daily with breakfast.     . Cholecalciferol (VITAMIN D3) 3000 UNITS TABS Take 1 capsule by mouth daily.    . cyclobenzaprine (FLEXERIL) 10 MG tablet Take 1 tablet (10 mg total) by mouth 3 (three) times daily as needed for muscle spasms. 45 tablet 0  . HYDROcodone-acetaminophen (NORCO/VICODIN) 5-325 MG per tablet Take 1 tablet by mouth every 6 (six) hours as needed for moderate pain. 60 tablet 0  . loratadine  (CLARITIN) 10 MG tablet Take 10 mg by mouth daily.    Marland Kitchen omeprazole (PRILOSEC) 40 MG capsule Take 1 capsule (40 mg total) by mouth 2 (two) times daily. 60 capsule 3  . sucralfate (CARAFATE) 1 G tablet Take 1 tablet (1 g total) by mouth 4 (four) times daily -  with meals and at bedtime. 90 tablet 3  . traZODone (DESYREL) 100 MG tablet TAKE 1 TABLET BY MOUTH EVERY NIGHT AT BEDTIME 30 tablet 3  . valsartan (DIOVAN) 160 MG tablet Take 1 tablet (160 mg total) by mouth daily. 30 tablet 6   No current facility-administered medications on file prior to visit.    BP 158/75 mmHg  Pulse 93  Temp(Src) 98.8 F (37.1 C) (Oral)  Ht 5\' 1"  (1.549 m)  Wt 198 lb 3.2 oz (89.903 kg)  BMI 37.47 kg/m2  SpO2 98%       Objective:   Physical Exam   General Mental Status- Alert. General Appearance- Not in acute distress.   Skin General: Color- Normal Color. Moisture- Normal Moisture.  Neck Carotid Arteries- Normal color. Moisture- Normal Moisture. No carotid bruits. No JVD.  Chest and Lung Exam Auscultation: Breath Sounds:-Normal.  Cardiovascular Auscultation:Rythm- Regular. Murmurs & Other Heart Sounds:Auscultation of the heart reveals- No Murmurs.   Abdomen Inspection:-Inspection Normal.  Palpation/Perucssion: Palpation and Percussion of the abdomen reveal- Non Tender, No Rebound tenderness, No rigidity(Guarding) and No Palpable abdominal masses.  Liver:-Normal.  Spleen:- Normal.   Lt upper ext- no pain on palpation or rom Lt shoulder- no pain on rom or obvious crepitus.   Neurologic Cranial Nerve exam:- CN III-XII intact(No nystagmus), symmetric smile. Drift Test:- No drift. Romberg Exam:- Negative.  Heal to Toe Gait exam:-Normal. Finger to Nose:- Normal/Intact Strength:- 5/5 equal and symmetric strength both upper and lower extremities.       Assessment & Plan:  Pt declined my advise to go to ED now. Despite my counseling. Therefore discussed case with Dr. Birdie Riddle and had to to  outpatient study with advisement not best course of action but is what she allows [place] to do.

## 2014-09-05 NOTE — Telephone Encounter (Signed)
Pt states she has lymphoedema in left arm.  However, today her left arm and shoulder feels heavier.  She is unsure if left arm is larger than normal, it just feels heavier.  Left shoulder feels like there is "fluid in the joint."  Shoulder makes crackling sound with movement.  Has chest tightness.    Intermittent tiny sharp pains right above left breast and "inside" left side of back.  No pain right now.  Bottom of stomach also feels heavy.  She denies chest pressure, shortness of breath, n/v, jaw pain, or abdominal pain.    Hx. Breast CA, HTN, DM  Advice: Office visit vs ER.  Pt declined ER. Appointment scheduled with Mackie Pai, PA-C today (09/05/14) at 11:30 am.

## 2014-09-05 NOTE — Assessment & Plan Note (Addendum)
Your symptoms are atypical chest pain and arm heaviness we did ekg in our office, troponin and d-dimer stat. If you pain worsens or changes then recommend ED evaluation.  Your were made aware best option would be to go to ED now but you declined. Therefore we are doing outpt labs.  If labs positive then ED eval.  We will call you with lab results.

## 2014-09-05 NOTE — Telephone Encounter (Signed)
Patient coming in office for OV with E.Saguir,PA.

## 2014-09-05 NOTE — Patient Instructions (Signed)
Atypical chest pain Your symptoms are atypical chest pain and arm heaviness we did ekg in our office, troponin and d-dimer stat. If you pain worsens or changes then recommend ED evaluation.  Your were made aware best option would be to go to ED now but you declined. Therefore we are doing outpt labs.  If labs positive then ED eval.  We will call you with lab results.   Abdominal pain, epigastric Some boated sensation and hx of Gerd. Continue omeprazole and take zantac 150 mg otc twice daily.    Follow up 7 days or as needed.

## 2014-09-05 NOTE — Progress Notes (Signed)
Pre visit review using our clinic review tool, if applicable. No additional management support is needed unless otherwise documented below in the visit note. 

## 2014-09-06 ENCOUNTER — Encounter (HOSPITAL_BASED_OUTPATIENT_CLINIC_OR_DEPARTMENT_OTHER): Payer: Self-pay | Admitting: Emergency Medicine

## 2014-09-06 ENCOUNTER — Emergency Department (HOSPITAL_BASED_OUTPATIENT_CLINIC_OR_DEPARTMENT_OTHER): Payer: Medicare Other

## 2014-09-06 ENCOUNTER — Telehealth: Payer: Self-pay | Admitting: *Deleted

## 2014-09-06 ENCOUNTER — Emergency Department (HOSPITAL_BASED_OUTPATIENT_CLINIC_OR_DEPARTMENT_OTHER)
Admission: EM | Admit: 2014-09-06 | Discharge: 2014-09-06 | Disposition: A | Discharge status: Home / Self Care | Payer: Medicare Other | Attending: Emergency Medicine | Admitting: Emergency Medicine

## 2014-09-06 ENCOUNTER — Telehealth: Payer: Self-pay | Admitting: Medical

## 2014-09-06 DIAGNOSIS — Z7982 Long term (current) use of aspirin: Secondary | ICD-10-CM | POA: Diagnosis not present

## 2014-09-06 DIAGNOSIS — Z87891 Personal history of nicotine dependence: Secondary | ICD-10-CM | POA: Insufficient documentation

## 2014-09-06 DIAGNOSIS — I252 Old myocardial infarction: Secondary | ICD-10-CM | POA: Insufficient documentation

## 2014-09-06 DIAGNOSIS — I10 Essential (primary) hypertension: Secondary | ICD-10-CM | POA: Insufficient documentation

## 2014-09-06 DIAGNOSIS — Z8673 Personal history of transient ischemic attack (TIA), and cerebral infarction without residual deficits: Secondary | ICD-10-CM | POA: Insufficient documentation

## 2014-09-06 DIAGNOSIS — R0789 Other chest pain: Secondary | ICD-10-CM | POA: Diagnosis not present

## 2014-09-06 DIAGNOSIS — Z880 Allergy status to penicillin: Secondary | ICD-10-CM | POA: Insufficient documentation

## 2014-09-06 DIAGNOSIS — E119 Type 2 diabetes mellitus without complications: Secondary | ICD-10-CM | POA: Insufficient documentation

## 2014-09-06 DIAGNOSIS — R0602 Shortness of breath: Secondary | ICD-10-CM | POA: Diagnosis not present

## 2014-09-06 DIAGNOSIS — R202 Paresthesia of skin: Secondary | ICD-10-CM | POA: Diagnosis not present

## 2014-09-06 DIAGNOSIS — Z853 Personal history of malignant neoplasm of breast: Secondary | ICD-10-CM | POA: Diagnosis not present

## 2014-09-06 DIAGNOSIS — Z79899 Other long term (current) drug therapy: Secondary | ICD-10-CM | POA: Insufficient documentation

## 2014-09-06 DIAGNOSIS — M79622 Pain in left upper arm: Secondary | ICD-10-CM | POA: Diagnosis not present

## 2014-09-06 DIAGNOSIS — R609 Edema, unspecified: Secondary | ICD-10-CM | POA: Insufficient documentation

## 2014-09-06 DIAGNOSIS — R079 Chest pain, unspecified: Secondary | ICD-10-CM | POA: Diagnosis not present

## 2014-09-06 LAB — CBC
HEMATOCRIT: 31.8 % — AB (ref 36.0–46.0)
Hemoglobin: 10 g/dL — ABNORMAL LOW (ref 12.0–15.0)
MCH: 28.1 pg (ref 26.0–34.0)
MCHC: 31.4 g/dL (ref 30.0–36.0)
MCV: 89.3 fL (ref 78.0–100.0)
Platelets: 324 10*3/uL (ref 150–400)
RBC: 3.56 MIL/uL — ABNORMAL LOW (ref 3.87–5.11)
RDW: 14.2 % (ref 11.5–15.5)
WBC: 7.4 10*3/uL (ref 4.0–10.5)

## 2014-09-06 LAB — TROPONIN I: Troponin I: <0.03 ng/mL (ref <0.031)

## 2014-09-06 LAB — BASIC METABOLIC PANEL
Anion gap: 6 (ref 5–15)
BUN: 9 mg/dL (ref 6–23)
CALCIUM: 8.3 mg/dL — AB (ref 8.4–10.5)
CHLORIDE: 102 mmol/L (ref 96–112)
CO2: 26 mmol/L (ref 19–32)
CREATININE: 0.97 mg/dL (ref 0.50–1.10)
GFR calc Af Amer: 67 mL/min — ABNORMAL LOW (ref >90)
GFR calc non Af Amer: 58 mL/min — ABNORMAL LOW (ref >90)
Glucose, Bld: 102 mg/dL — ABNORMAL HIGH (ref 70–99)
Potassium: 3.6 mmol/L (ref 3.5–5.1)
Sodium: 134 mmol/L — ABNORMAL LOW (ref 135–145)

## 2014-09-06 LAB — D-DIMER, QUANTITATIVE: D-Dimer, Quant: 0.66 ug/mL-FEU — ABNORMAL HIGH (ref 0.00–0.48)

## 2014-09-06 NOTE — Discharge Instructions (Signed)

## 2014-09-06 NOTE — Telephone Encounter (Signed)
Pt has mild elevated d-dimer. The test came back this am. I called pt but she was not available. The troponin was negative. Test again done through our lab service yesterday  since she refused to go to ED yesterday. I left message asking pt to call me back today. Stating it is extremely important. Pt had atypical symptoms chest, shoulder , and axillary. Now considering ct of chest r/o out PE.

## 2014-09-06 NOTE — Telephone Encounter (Signed)
Dr. Ardis Hughs,  I just wanted to make your aware that this pt saw her family doctor on 09-05-14 for atypical chest pain.  She did have blood drawn and an EKG done, but didn't want to go to the ED.  I know she is coming in on 09-19-14 for an EGD for abdominal pain, chronic, epigastric.  I'm not sure if this is the same discomfort she has been having, but just wanted to make sure you were aware of the chest pain.  Thanks, [employer]

## 2014-09-06 NOTE — ED Provider Notes (Signed)
CSN: [id number]     Arrival date & time 09/06/14  1201 History   First MD Initiated Contact with Patient 09/06/14 1217     Chief Complaint  Patient presents with  . Chest Pain  . Numbness     HPI patient has been having a pressure sensation across her chest for last 2 weeks.  Sensations worse after she gets up.  She has had no shortness of breath.  She had history of an MI several years ago.  No stent placed.  Also had a partial meniscectomy and has chronic lymphedema in her left upper extremity.  Her arm has felt heavy recently.   Past Medical History  Diagnosis Date  . Arthritis   . Hypertension   . Chronic bronchitis   . Myocardial infarction 2001  . Stroke   . Cancer 2000    breast cancer  . Diabetes mellitus without complication   . Hyperlipidemia    Past Surgical History  Procedure Laterality Date  . Breast surgery  2001  . Abdominal hysterectomy  1985  . Small intestine surgery     Family History  Problem Relation Age of Onset  . Diabetes Father   . Lung cancer Sister   . Colon cancer Brother   . Heart disease Brother   . Diabetes Brother   . Hypertension Brother   . Diabetes Paternal Aunt   . Stroke Maternal Grandmother   . Diabetes Paternal Grandmother   . Colon polyps Neg Hx   . Esophageal cancer Neg Hx   . Gallbladder disease Neg Hx    History  Substance Use Topics  . Smoking status: Former Smoker -- 1.00 packs/day for 20 years    Types: Cigarettes  . Smokeless tobacco: Never Used     Comment: Quit >4 years ago  . Alcohol Use: No   OB History    No data available     Review of Systems  Constitutional: Negative for fever and chills.  Cardiovascular: Negative for leg swelling.  Hematological: Negative for adenopathy.      Allergies  Tape; Ambien; Amoxicillin; Contrast media; and Prednisone  Home Medications   Prior to Admission medications   Medication Sig Start Date End Date Taking? Authorizing Provider  albuterol (PROAIR HFA) 108 (90  BASE) MCG/ACT inhaler Inhale 2 puffs into the lungs every 4 (four) hours as needed for wheezing. 12/06/13   [name], MD  aspirin 81 MG tablet Take 81 mg by mouth daily.    Historical Provider, MD  atorvastatin (LIPITOR) 10 MG tablet Take 1 tablet (10 mg total) by mouth daily. 07/21/14   [name], MD  calcium carbonate (OS-CAL) 600 MG TABS tablet Take 1,200 mg by mouth daily with breakfast.     Historical Provider, MD  Cholecalciferol (VITAMIN D3) 3000 UNITS TABS Take 1 capsule by mouth daily.    Historical Provider, MD  cyclobenzaprine (FLEXERIL) 10 MG tablet Take 1 tablet (10 mg total) by mouth 3 (three) times daily as needed for muscle spasms. 11/11/13   [name], MD  HYDROcodone-acetaminophen (NORCO/VICODIN) 5-325 MG per tablet Take 1 tablet by mouth every 6 (six) hours as needed for moderate pain. 05/19/14   [name], MD  loratadine (CLARITIN) 10 MG tablet Take 10 mg by mouth daily.    Historical Provider, MD  omeprazole (PRILOSEC) 40 MG capsule Take 1 capsule (40 mg total) by mouth 2 (two) times daily. 08/25/14   [name], MD  sucralfate ([place])  1 G tablet Take 1 tablet (1 g total) by mouth 4 (four) times daily -  with meals and at bedtime. 08/26/14   [name], MD  traZODone (DESYREL) 100 MG tablet TAKE 1 TABLET BY MOUTH EVERY NIGHT AT BEDTIME 06/03/14   [name], MD  valsartan (DIOVAN) 160 MG tablet Take 1 tablet (160 mg total) by mouth daily. 08/25/14   [name], MD   BP 132/60 mmHg  Pulse 73  Temp(Src) 99.4 F (37.4 C) (Oral)  Resp 14  Ht 5' (1.524 m)  Wt 198 lb (89.812 kg)  BMI 38.67 kg/m2  SpO2 99% Physical Exam  Constitutional: She is oriented to person, place, and time. She appears well-developed and well-nourished. No distress.  HENT:  Head: Normocephalic and atraumatic.  Eyes: Pupils are equal, round, and reactive to light.  Neck: Normal range of motion.  Cardiovascular: Normal rate and intact  distal pulses.   Pulmonary/Chest: No respiratory distress.  Abdominal: Normal appearance. She exhibits no distension.  Musculoskeletal: Normal range of motion. She exhibits edema (Left upper extremity which is chronic).  Lymphadenopathy:    She has no cervical adenopathy.    She has axillary adenopathy.  Neurological: She is alert and oriented to person, place, and time. No cranial nerve deficit.  Skin: Skin is warm and dry. No rash noted.  Psychiatric: She has a normal mood and affect. Her behavior is normal.  Nursing note and vitals reviewed.   ED Course  Procedures (including critical care time) Labs Review Labs Reviewed  CBC - Abnormal; Notable for the following:    RBC 3.56 (*)    Hemoglobin 10.0 (*)    HCT 31.8 (*)    All other components within normal limits  BASIC METABOLIC PANEL - Abnormal; Notable for the following:    Sodium 134 (*)    Glucose, Bld 102 (*)    Calcium 8.3 (*)    GFR calc non Af Amer 58 (*)    GFR calc Af Amer 67 (*)    All other components within normal limits  TROPONIN I    Imaging Review No results found.   Date: 09/06/2014  Rate: 82  Rhythm: normal sinus rhythm  QRS Axis: normal  Intervals: normal  ST/T Wave abnormalities: normal  Conduction Disutrbances: none  Narrative Interpretation: normal ECG  No DVT noted on LUE .  Age adjused D-Dimer negative.  Doubt DVT.  Will arrange for outpatient evaluation with cardiology.  Patient ok with plan. Stable for discharge.    MDM   Final diagnoses:  Atypical chest pain        Dot Lanes, MD 09/09/14 1148

## 2014-09-06 NOTE — Telephone Encounter (Signed)
Patient returned call notified E.Saguier who called patient back.

## 2014-09-06 NOTE — ED Notes (Signed)
Pt comes in today with a c/o chest tightness that started about 2 weeks ago. Pt states she decided today to have an evaluation to make sure there is nothing wrong. Pt states she had an EKG done yesterday and was instructed to come in today. Pt also c/o numbness and tingling in her left fingers.

## 2014-09-06 NOTE — Telephone Encounter (Signed)
I discussed this case with Dr. Birdie Riddle. Decision made to advise to go to ED now. Delay in work up already since yesterday she was advised to go to ED. So most direct route to r/o PE through the ED. I notified pt and advised to go now. She states she will go now to  our med center ED.

## 2014-09-08 ENCOUNTER — Telehealth: Payer: Self-pay | Admitting: Family Medicine

## 2014-09-08 DIAGNOSIS — R609 Edema, unspecified: Secondary | ICD-10-CM

## 2014-09-08 NOTE — Telephone Encounter (Signed)
done

## 2014-09-08 NOTE — Telephone Encounter (Signed)
Please advise 

## 2014-09-08 NOTE — Telephone Encounter (Signed)
Caller name:  Ysela Relation to pt: self Call back number: [phone] Pharmacy:  Reason for call:   Patient requesting referral to [facility] on [address]. Needs therapy on L arm. Arm is swollen.

## 2014-09-08 NOTE — Telephone Encounter (Signed)
Referral placed.

## 2014-09-08 NOTE — Telephone Encounter (Signed)
Ok for referral- may be able to put vascular and then specify the lymphedema center.

## 2014-09-09 ENCOUNTER — Other Ambulatory Visit (INDEPENDENT_AMBULATORY_CARE_PROVIDER_SITE_OTHER): Payer: Medicare Other

## 2014-09-09 ENCOUNTER — Other Ambulatory Visit: Payer: Self-pay

## 2014-09-09 DIAGNOSIS — R1013 Epigastric pain: Secondary | ICD-10-CM

## 2014-09-09 DIAGNOSIS — R109 Unspecified abdominal pain: Secondary | ICD-10-CM | POA: Diagnosis not present

## 2014-09-09 LAB — FECAL OCCULT BLOOD, IMMUNOCHEMICAL: FECAL OCCULT BLD: POSITIVE — AB

## 2014-09-12 ENCOUNTER — Ambulatory Visit: Payer: Medicare Other | Admitting: Physical Therapy

## 2014-09-12 ENCOUNTER — Telehealth: Payer: Self-pay | Admitting: *Deleted

## 2014-09-12 NOTE — Telephone Encounter (Signed)
Dr Ardis Hughs, Just wanted to make sure it's okay to proceed with this Pt's egd for 2-22 Monday. She was seen by her PCP 2-8 with chest pain and refused ED then went to ED 2-9 with chest pain. Her troponin was negative and she had a normal ECG NSR but she had c/o CP 2 weeks with heavy left arm . I didn't know if this was the same pain as when you saw her in the office 08-30-14 but wanted to make sure she didn't need cardiac clearance prior to her EGD. She did have MI 2001.  Please advise. Kristen had sent you a note as well a week or so ago and looks as if another MD has responded to that note with lab results so just want to be sure it's ok to proceed  Thanks for your time,   Marijean Niemann

## 2014-09-12 NOTE — Telephone Encounter (Signed)
Ok to proceed w/ EGD from our standpoint

## 2014-09-12 NOTE — Telephone Encounter (Signed)
Noted, prep placed and egd as scheduled.  Tracy [name] PV

## 2014-09-12 NOTE — Telephone Encounter (Signed)
Tracy [name], We are OK to proceed. She's had EKGs, evaluation by her PCP and in ED.    I'll forward to Dr. Birdie Riddle who just sent me a message this morning about her FOBT + stool testing for any feedback regarding her symptoms.

## 2014-09-14 ENCOUNTER — Ambulatory Visit (AMBULATORY_SURGERY_CENTER): Payer: Self-pay

## 2014-09-14 ENCOUNTER — Ambulatory Visit: Payer: Medicare Other | Attending: Family Medicine | Admitting: Physical Therapy

## 2014-09-14 VITALS — Ht 60.0 in | Wt 196.2 lb

## 2014-09-14 DIAGNOSIS — M25612 Stiffness of left shoulder, not elsewhere classified: Secondary | ICD-10-CM | POA: Insufficient documentation

## 2014-09-14 DIAGNOSIS — R1013 Epigastric pain: Secondary | ICD-10-CM

## 2014-09-14 DIAGNOSIS — I890 Lymphedema, not elsewhere classified: Secondary | ICD-10-CM | POA: Diagnosis not present

## 2014-09-14 NOTE — Therapy (Signed)
[place], [place], [address] Phone: [phone]   Fax:  [phone]  Physical Therapy Evaluation  Patient Details  Name: Tracy [name] MRN: [id number] Date of Birth: 1945/12/31 Referring Provider:  [name], MD  Encounter Date: 09/14/2014      PT End of Session - 09/14/14 1518    Visit Number 1   Number of Visits 5   Date for PT Re-Evaluation 11/12/14   PT Start Time 1025   PT Stop Time 1132   PT Time Calculation (min) 67 min   Activity Tolerance Patient tolerated treatment well   Behavior During Therapy [facility] for tasks assessed/performed      Past Medical History  Diagnosis Date  . Arthritis   . Hypertension   . Chronic bronchitis   . Myocardial infarction 2001  . Stroke   . Cancer 2000    breast cancer  . Diabetes mellitus without complication   . Hyperlipidemia     Past Surgical History  Procedure Laterality Date  . Breast surgery  2001  . Abdominal hysterectomy  1985  . Small intestine surgery      There were no vitals taken for this visit.  Visit Diagnosis:  Lymphedema of arm - Plan: PT plan of care cert/re-cert  Stiffness of joint, shoulder region, left - Plan: PT plan of care cert/re-cert      Subjective Assessment - 09/14/14 1029    Symptoms About three weeks ago I started getting alot of fluid in this arm (left); it got heavy, achy, and full.     Pertinent History Had left shoulder bursitis and frozen shoulder years ago (stopped therapy for that).  Left breast cancer diagnosed in 2000 with lymph node showing cancer but no primary  tumor found (nor removed); had axillary node dissection.  Also had chemotherapy and radiation.  Pt. had a sleeve that got lost when she moved here.  Got the sleeve in 2001; moved here in 2007.  Arm would swell and then go back down.  Now it is difficult to raise the arm.  Last week was fit for a new glove and sleeve.  Sometimes these  aggravate the arm and hand.   Left chest pains developed with increase in fluid in arm, so will have endoscopy.  Doesn't have a nighttime compression garment.  PT. did have doppler for arm, which was negative.  Had x-ray and CT of chest/trunk, which were negative.                                                               Patient Stated Goals Wants "to know if this glove is right."    Currently in Pain? Yes   Pain Score 2   was as high as 8   Pain Location Chest   Pain Orientation Left   Pain Descriptors / Indicators --  like a band or pressure; was throbbing at left axilla   Pain Relieving Factors using sleeve   Multiple Pain Sites Yes   Pain Score 8  sleeve lowers it from a 10   Pain Location Arm   Pain Orientation Left   Pain Descriptors / Indicators Pressure          [facility] PT Assessment - 09/14/14  0001    Assessment   Medical Diagnosis lymphedema   Onset Date 08/21/14   Precautions   Precautions Other (comment)   Precaution Comments cancer precautions   Restrictions   Weight Bearing Restrictions No   Balance Screen   Has the patient fallen in the past 6 months No   Has the patient had a decrease in activity level because of a fear of falling?  No   Is the patient reluctant to leave their home because of a fear of falling?  No   Home Environment   Living Enviornment Private residence   Living Arrangements Alone   Type of [place] to enter   Entrance Stairs-Number of Steps 13  plans to move to lower level apartment with fewer stairs    Prior Function   Level of Independence Independent with basic ADLs;Independent with homemaking with ambulation;Independent with gait   Vocation Retired   Observation/Other Assessments   Other Surveys  Select   Quick DASH  53  9 of 11 questions answered   AROM   Right Shoulder Flexion 147 Degrees   Right Shoulder ABduction 144 Degrees   Left Shoulder Flexion 69 Degrees   Left Shoulder ABduction 71  Degrees   PROM   Left Shoulder Flexion 124 Degrees   Left Shoulder ABduction 95 Degrees   Left Shoulder Internal Rotation 78 Degrees   Left Shoulder External Rotation 30 Degrees           LYMPHEDEMA/ONCOLOGY QUESTIONNAIRE - 09/14/14 1514    Type   Cancer Type left breast           Quick Dash - 09/14/14 0001    Open a tight or new jar Severe difficulty   Do heavy household chores (wash walls, wash floors) Moderate difficulty   Wash your back Unable   Use a knife to cut food Moderate difficulty   Recreational activities in which you take some force or impact through your arm, shoulder, or hand (golf, hammering, tennis) Severe difficulty   During the past week, to what extent has your arm, shoulder or hand problem interfered with your normal social activities with family, friends, neighbors, or groups? Quite a bit   During the past week, to what extent has your arm, shoulder or hand problem limited your work or other regular daily activities Quite a bit   Arm, shoulder, or hand pain. Severe   Difficulty Sleeping Moderate difficulty   DASH Score 52.27 %      Checked fit of patient's new compression sleeve and glove.  Fit appears fine, though these are compression class I for a patient who has signficant swelling and who could probably benefit from compression class II.  Discussed this with patient.  Also discussed use of a sports bra and/or a compression T-shirt or Spanx-type garment for feeling of swelling at left flank.  Patient says she has one of each type that she will try.  Mentioned that nighttime compression garment may help her; patient is interested in this but concerned about cost.  Educated about some scholarship options to help her pay for this type garment ([employer], [employer]) and that these involve an applicatin process.  Discussed treatment options.  At this time, patient wants to minimize visits due to concern about costs.  She has multiple  other medical appointments in the recent past or near future that have contributed to this concern; she also wants to wait to start therapy until  some of these appointments have been completed.  She does express her feeling that she has a definite need for therapy.                      [facility] Goals - 09/19/14 1522    CC Long Term Goal  #1   Title patient will be knowledgeable about self-manual lymph drainage   Time 3   Period Weeks   Status New   CC Long Term Goal  #2   Title Patient will be knowledgeable about options for nighttime compression garments, and how/where to obtain one.   Time 3   Period Weeks   Status New   CC Long Term Goal  #3   Title Pt. will be independent with home exercise program for left shoulder ROM and strengthening.   Time 3   Period Weeks   Status New            Plan - 09-19-2014 1519    Clinical Impression Statement Patient with apparent recent exacerbation of left UE lymphedema with other causes of increased swelling ruled out will benefit from therapy to assist her with self-care for lymphedema.  Patient wants to minimize her number of visits at this time.     Pt will benefit from skilled therapeutic intervention in order to improve on the following deficits Increased edema;Decreased knowledge of use of DME;Decreased strength;Decreased range of motion   Rehab Potential Good   PT Frequency 1x / week   PT Duration 3 weeks   PT Treatment/Interventions Patient/family education;ADLs/Self Care Home Management;Manual techniques;Therapeutic exercise;Manual lymph drainage   PT Next Visit Plan Teach self-manual lymph drainage and loan DVD for this.  Show nighttime compression garments and guide her with this.  Left shoulder ROM, strengthening HEP.   Consulted and Agree with Plan of Care Patient          G-Codes - 09/19/2014 1526    Functional Assessment Tool Used quick DASH   Functional Limitation Carrying, moving and handling  objects   Carrying, Moving and Handling Objects Current Status [phone]) At least 40 percent but less than 60 percent impaired, limited or restricted   Carrying, Moving and Handling Objects Goal Status (T0160) At least 20 percent but less than 40 percent impaired, limited or restricted       Problem List Patient Active Problem List   Diagnosis Date Noted  . Atypical chest pain 09/05/2014  . Abdominal pain, epigastric 08/25/2014  . Diabetes mellitus type II, controlled 05/19/2014  . Allergy to adhesive tape 05/19/2014  . Other and unspecified hyperlipidemia 12/06/2013  . GERD (gastroesophageal reflux disease) 12/06/2013  . Severe headache 11/11/2013  . Cervical disc disease 11/11/2013  . Osteopenia 05/25/2013  . Nausea alone 05/10/2013  . Allergic asthma 12/24/2012  . Insomnia 04/02/2012  . Physical exam, annual 04/02/2012  . Cramps, muscle, general 02/25/2012  . Chest pain 02/25/2012  . Sinusitis acute 02/19/2012  . HTN (hypertension) 02/03/2012  . Vertigo, benign positional 02/03/2012  . Breast cancer 02/03/2012  . Left groin pain 02/03/2012    Suzan Manon 2014-09-19, 3:34 PM  [place], [place], [address] Phone: [phone]   Fax:  [phone]   Serafina Royals, [place]

## 2014-09-14 NOTE — Progress Notes (Signed)
No allergies to eggs or soy No home oxygen No past problems with anesthesia No diet/weight loss meds  No internet 

## 2014-09-15 ENCOUNTER — Other Ambulatory Visit: Payer: Self-pay | Admitting: Family Medicine

## 2014-09-15 NOTE — Telephone Encounter (Signed)
Med filled.  

## 2014-09-19 ENCOUNTER — Ambulatory Visit (AMBULATORY_SURGERY_CENTER): Payer: Medicare Other | Admitting: Gastroenterology

## 2014-09-19 ENCOUNTER — Encounter: Payer: Self-pay | Admitting: Gastroenterology

## 2014-09-19 ENCOUNTER — Telehealth: Payer: Self-pay

## 2014-09-19 VITALS — BP 133/73 | HR 72 | Temp 95.6°F | Resp 12 | Ht 60.0 in | Wt 196.0 lb

## 2014-09-19 DIAGNOSIS — K2950 Unspecified chronic gastritis without bleeding: Secondary | ICD-10-CM

## 2014-09-19 DIAGNOSIS — Z8673 Personal history of transient ischemic attack (TIA), and cerebral infarction without residual deficits: Secondary | ICD-10-CM | POA: Diagnosis not present

## 2014-09-19 DIAGNOSIS — K2970 Gastritis, unspecified, without bleeding: Secondary | ICD-10-CM

## 2014-09-19 DIAGNOSIS — K2990 Gastroduodenitis, unspecified, without bleeding: Secondary | ICD-10-CM

## 2014-09-19 DIAGNOSIS — R1013 Epigastric pain: Secondary | ICD-10-CM | POA: Diagnosis not present

## 2014-09-19 MED ORDER — SODIUM CHLORIDE 0.9 % IV SOLN: 500.0000 mL | INTRAVENOUS | Status: DC

## 2014-09-19 NOTE — Telephone Encounter (Signed)
-----   Message from Milus Banister, MD sent at 09/19/2014 10:55 AM EST ----- Dr. Birdie Riddle, Just completed EGD. See below  ENDOSCOPIC IMPRESSION: There was mild, non-specific distal gastritis.  This was biospied and sent to pathology.  The examination was otherwise normal     RECOMMENDATIONS: Await final pathology.  If biopsies show H.  pylori, you will be started on appropriate antibiotics.  You were recently found to have microscopic blood in your stool, will contact Dr.  Liliane Channel office to get a copy of your recent (2014/2015 colonoscopy).    Stefon Ramthun, Can you contact Dr. Liliane Channel office to get records from her recent colonoscopy, any associated pathology results.

## 2014-09-19 NOTE — Patient Instructions (Addendum)
YOU HAD AN ENDOSCOPIC PROCEDURE TODAY AT [facility]: Refer to the procedure report that was given to you for any specific questions about what was found during the examination.  If the procedure report does not answer your questions, please call your gastroenterologist to clarify.  If you requested that your care partner not be given the details of your procedure findings, then the procedure report has been included in a sealed envelope for you to review at your convenience later.  YOU SHOULD EXPECT: Some feelings of bloating in the abdomen. Passage of more gas than usual.  Walking can help get rid of the air that was put into your GI tract during the procedure and reduce the bloating. If you had a lower endoscopy (such as a colonoscopy or flexible sigmoidoscopy) you may notice spotting of blood in your stool or on the toilet paper. If you underwent a bowel prep for your procedure, then you may not have a normal bowel movement for a few days.  DIET: Your first meal following the procedure should be a light meal and then it is ok to progress to your normal diet.  A half-sandwich or bowl of soup is an example of a good first meal.  Heavy or fried foods are harder to digest and may make you feel nauseous or bloated.  Likewise meals heavy in dairy and vegetables can cause extra gas to form and this can also increase the bloating.  Drink plenty of fluids but you should avoid alcoholic beverages for 24 hours.  ACTIVITY: Your care partner should take you home directly after the procedure.  You should plan to take it easy, moving slowly for the rest of the day.  You can resume normal activity the day after the procedure however you should NOT DRIVE or use heavy machinery for 24 hours (because of the sedation medicines used during the test).    SYMPTOMS TO REPORT IMMEDIATELY: A gastroenterologist can be reached at any hour.  During normal business hours, 8:30 AM to 5:00 PM Monday through Friday,  call [phone].  After hours and on weekends, please call the GI answering service at [phone] who will take a message and have the physician on call contact you.     Following upper endoscopy (EGD)  Vomiting of blood or coffee ground material  New chest pain or pain under the shoulder blades  Painful or persistently difficult swallowing  New shortness of breath  Fever of 100F or higher  Black, tarry-looking stools  FOLLOW UP: If any biopsies were taken you will be contacted by phone or by letter within the next 1-3 weeks.  Call your gastroenterologist if you have not heard about the biopsies in 3 weeks.  Our staff will call the home number listed on your records the next business day following your procedure to check on you and address any questions or concerns that you may have at that time regarding the information given to you following your procedure. This is a courtesy call and so if there is no answer at the home number and we have not heard from you through the emergency physician on call, we will assume that you have returned to your regular daily activities without incident.  Gastritis-handout given.   SIGNATURES/CONFIDENTIALITY: You and/or your care partner have signed paperwork which will be entered into your electronic medical record.  These signatures attest to the fact that that the information above on your After Visit Summary has been reviewed  and is understood.  Full responsibility of the confidentiality of this discharge information lies with you and/or your care-partner.

## 2014-09-19 NOTE — Progress Notes (Signed)
Report to PACU, RN, vss, BBS= Clear.  

## 2014-09-19 NOTE — Op Note (Signed)
[place]  [address]. [place], [address]   ENDOSCOPY PROCEDURE REPORT  PATIENT: Tracy, [name]  MR#: [id number] BIRTHDATE: Mar 19, 1946 , 60  yrs. old GENDER: female ENDOSCOPIST: Milus Banister, MD REFERRED BY:  [name], M.D. PROCEDURE DATE:  09/19/2014 PROCEDURE:  EGD w/ biopsy ASA CLASS:     Class III INDICATIONS:  intermittent epigastric pains, nausea; somewhat positional.  CT scan 07/2014 without clear etiology.Marland Kitchen MEDICATIONS: Monitored anesthesia care and Propofol 200 mg IV TOPICAL ANESTHETIC: none  DESCRIPTION OF PROCEDURE: After the risks benefits and alternatives of the procedure were thoroughly explained, informed consent was obtained.  The LB [id number] endoscope was introduced through the mouth and advanced to the second portion of the duodenum , Without limitations.  The instrument was slowly withdrawn as the mucosa was fully examined.  There was mild, non-specific distal gastritis.  This was biospied and sent to pathology.  The examination was otherwise normal. Retroflexed views revealed no abnormalities.     The scope was then withdrawn from the patient and the procedure completed.  COMPLICATIONS: There were no immediate complications.  ENDOSCOPIC IMPRESSION: There was mild, non-specific distal gastritis.  This was biospied and sent to pathology.  The examination was otherwise normal  RECOMMENDATIONS: Await final pathology.  If biopsies show H.  pylori, you will be started on appropriate antibiotics.  You were recently found to have microscopic blood in your stool, will contact Dr.  Liliane Channel office to get a copy of your recent (2014/2015 colonoscopy).    eSigned:  Milus Banister, MD 09/19/2014 10:54 AM

## 2014-09-19 NOTE — Progress Notes (Signed)
Called to room to assist during endoscopic procedure.  Patient ID and intended procedure confirmed with present staff. Received instructions for my participation in the procedure from the performing physician.  

## 2014-09-20 ENCOUNTER — Telehealth: Payer: Self-pay

## 2014-09-20 NOTE — Telephone Encounter (Signed)
  Follow up Call-  Call back number 09/19/2014  Post procedure Call Back phone  # [phone]  Permission to leave phone message Yes     Patient questions:  Do you have a fever, pain , or abdominal swelling? No. Pain Score  0 *  Have you tolerated food without any problems? Yes.    Have you been able to return to your normal activities? Yes.    Do you have any questions about your discharge instructions: Diet   No. Medications  No. Follow up visit  No.  Do you have questions or concerns about your Care? No.  Actions: * If pain score is 4 or above: No action needed, pain <4.

## 2014-09-22 NOTE — Telephone Encounter (Signed)
Release has been faxed to Dr Earlean Shawl

## 2014-09-23 ENCOUNTER — Telehealth: Payer: Self-pay

## 2014-09-23 NOTE — Telephone Encounter (Signed)
New message    Call patient to make an appt . Patient stated she did not need an appt at this time.   Pt seen at [facility] by Dr Audie Pinto, needs eval for cp. H/o of MI in [place] several years ago. Not urgent, just in the next few weeks.   Thanks  Trisha

## 2014-09-26 ENCOUNTER — Other Ambulatory Visit: Payer: Self-pay | Admitting: Family Medicine

## 2014-09-26 NOTE — Telephone Encounter (Signed)
Med filled.  

## 2014-10-03 ENCOUNTER — Telehealth: Payer: Self-pay | Admitting: Gastroenterology

## 2014-10-03 DIAGNOSIS — K2970 Gastritis, unspecified, without bleeding: Secondary | ICD-10-CM

## 2014-10-03 DIAGNOSIS — K2990 Gastroduodenitis, unspecified, without bleeding: Principal | ICD-10-CM

## 2014-10-03 NOTE — Telephone Encounter (Signed)
Left message on machine to call back  

## 2014-10-03 NOTE — Telephone Encounter (Signed)
Colonoscopy Dr. Earlean Shawl, 05/2011.  Done for "screening."  Findings "inflammation at splenic flexure, medium hemorrhoids." pathology was normal.  He recommended repeat colonoscopy at 5 years for unclear reasons. EGD Dr. Earlean Shawl, 05/2011. Done for "reflux symptoms despite therapy."  Findings, "normal EGD"   Please call her, let her know I reviewed her previous GI records from Dr. Earlean Shawl.  No need for further testing currently.  Would like rov in 6-7 weeks with cbc a few days prior.

## 2014-10-03 NOTE — Telephone Encounter (Signed)
Pt aware and lab is already in [facility] pt will call back to set up appt for office visit

## 2014-10-24 ENCOUNTER — Telehealth: Payer: Self-pay | Admitting: Family Medicine

## 2014-10-24 NOTE — Telephone Encounter (Signed)
Caller name: Kyrie Relation to pt: self Call back number: [phone] Pharmacy:  Reason for call:   Requesting hydrocodone rx

## 2014-10-25 MED ORDER — HYDROCODONE-ACETAMINOPHEN 5-325 MG PO TABS: 1.0000 | ORAL_TABLET | Freq: Four times a day (QID) | ORAL | Status: DC | PRN

## 2014-10-25 NOTE — Telephone Encounter (Signed)
Refill granted in PCP absence.  Rx at front desk for pickup.  Will defer further refills to PCP.

## 2014-10-25 NOTE — Telephone Encounter (Signed)
Last OV 08/25/14 Hydrocodone last filled 05/22/14 #60 with 0  CSC, Low Risk

## 2014-10-25 NOTE — Telephone Encounter (Signed)
Pt notified, rx is available at front desk for pick up.

## 2014-11-07 ENCOUNTER — Other Ambulatory Visit: Payer: Self-pay | Admitting: Family Medicine

## 2014-11-07 NOTE — Telephone Encounter (Signed)
Med filled.  

## 2014-12-29 ENCOUNTER — Ambulatory Visit (INDEPENDENT_AMBULATORY_CARE_PROVIDER_SITE_OTHER): Payer: Medicare Other | Admitting: Family Medicine

## 2014-12-29 ENCOUNTER — Encounter: Payer: Self-pay | Admitting: Family Medicine

## 2014-12-29 VITALS — BP 128/76 | HR 85 | Temp 98.1°F | Resp 16 | Wt 189.5 lb

## 2014-12-29 DIAGNOSIS — E119 Type 2 diabetes mellitus without complications: Secondary | ICD-10-CM | POA: Diagnosis not present

## 2014-12-29 DIAGNOSIS — E785 Hyperlipidemia, unspecified: Secondary | ICD-10-CM | POA: Diagnosis not present

## 2014-12-29 DIAGNOSIS — I10 Essential (primary) hypertension: Secondary | ICD-10-CM

## 2014-12-29 DIAGNOSIS — H6123 Impacted cerumen, bilateral: Secondary | ICD-10-CM

## 2014-12-29 DIAGNOSIS — E1169 Type 2 diabetes mellitus with other specified complication: Secondary | ICD-10-CM | POA: Diagnosis not present

## 2014-12-29 DIAGNOSIS — H918X3 Other specified hearing loss, bilateral: Secondary | ICD-10-CM

## 2014-12-29 DIAGNOSIS — H6120 Impacted cerumen, unspecified ear: Secondary | ICD-10-CM | POA: Insufficient documentation

## 2014-12-29 NOTE — Progress Notes (Signed)
   Subjective:    Patient ID: Tracy [name], female    DOB: 20-Feb-1946, 69 y.o.   MRN: [id number]  HPI HTN- chronic problem, on Valsartan daily w/ good BP control.  No CP, SOB, HAs, visual changes, edema.  Hyperlipidemia- chronic problem, on Lipitor daily.  No abd pain, N/V  DM- chronic problem, currently diet controlled.  On ARB for renal protection.  UTD on eye exam, foot exam.  Has lost 6 lbs since last visit.  Eating low carb diet.  Some exercise.  Ear pain- bilateral ear fullness.  Decreased hearing.   Review of Systems For ROS see HPI     Objective:   Physical Exam  Constitutional: She is oriented to person, place, and time. She appears well-developed and well-nourished. No distress.  HENT:  Head: Normocephalic and atraumatic.  Bilateral cerumen impaction- unable to curette completely but ears were irrigated successfully  Eyes: Conjunctivae and EOM are normal. Pupils are equal, round, and reactive to light.  Neck: Normal range of motion. Neck supple. No thyromegaly present.  Cardiovascular: Normal rate, regular rhythm, normal heart sounds and intact distal pulses.   No murmur heard. Pulmonary/Chest: Effort normal and breath sounds normal. No respiratory distress.  Abdominal: Soft. She exhibits no distension. There is no tenderness.  Musculoskeletal: She exhibits no edema.  Lymphadenopathy:    She has no cervical adenopathy.  Neurological: She is alert and oriented to person, place, and time.  Skin: Skin is warm and dry.  Psychiatric: She has a normal mood and affect. Her behavior is normal.  Vitals reviewed.         Assessment & Plan:

## 2014-12-29 NOTE — Progress Notes (Signed)
Pre visit review using our clinic review tool, if applicable. No additional management support is needed unless otherwise documented below in the visit note. 

## 2014-12-29 NOTE — Patient Instructions (Signed)
Schedule your complete physical for after 10/13 We'll notify you of your lab results and make any changes if needed Keep up the good work!  You look great! Continue to make healthy food choices and get regular exercise We'll call you with your eye exam Start OTC Melatonin to help w/ sleep Call with any questions or concerns Have a great summer!

## 2014-12-30 ENCOUNTER — Encounter: Payer: Self-pay | Admitting: General Practice

## 2014-12-30 ENCOUNTER — Encounter: Payer: Self-pay | Admitting: Family Medicine

## 2014-12-30 LAB — CBC WITH DIFFERENTIAL/PLATELET
Basophils Absolute: 0 10*3/uL (ref 0.0–0.1)
Basophils Relative: 0.3 % (ref 0.0–3.0)
Eosinophils Absolute: 0.1 10*3/uL (ref 0.0–0.7)
Eosinophils Relative: 1 % (ref 0.0–5.0)
HEMATOCRIT: 33.2 % — AB (ref 36.0–46.0)
HEMOGLOBIN: 10.8 g/dL — AB (ref 12.0–15.0)
LYMPHS PCT: 47.1 % — AB (ref 12.0–46.0)
Lymphs Abs: 3.6 10*3/uL (ref 0.7–4.0)
MCHC: 32.6 g/dL (ref 30.0–36.0)
MCV: 88.5 fl (ref 78.0–100.0)
MONO ABS: 0.3 10*3/uL (ref 0.1–1.0)
Monocytes Relative: 4 % (ref 3.0–12.0)
NEUTROS PCT: 47.6 % (ref 43.0–77.0)
Neutro Abs: 3.7 10*3/uL (ref 1.4–7.7)
Platelets: 254 10*3/uL (ref 150.0–400.0)
RBC: 3.75 Mil/uL — ABNORMAL LOW (ref 3.87–5.11)
RDW: 16.7 % — ABNORMAL HIGH (ref 11.5–15.5)
WBC: 7.7 10*3/uL (ref 4.0–10.5)

## 2014-12-30 LAB — BASIC METABOLIC PANEL
BUN: 12 mg/dL (ref 6–23)
CHLORIDE: 104 meq/L (ref 96–112)
CO2: 28 mEq/L (ref 19–32)
CREATININE: 1.06 mg/dL (ref 0.40–1.20)
Calcium: 9.2 mg/dL (ref 8.4–10.5)
GFR: 65.76 mL/min (ref >60.00)
Glucose, Bld: 90 mg/dL (ref 70–99)
Potassium: 3.9 mEq/L (ref 3.5–5.1)
SODIUM: 140 meq/L (ref 135–145)

## 2014-12-30 LAB — LIPID PANEL
CHOLESTEROL: 145 mg/dL (ref 0–200)
HDL: 60.7 mg/dL (ref >39.00)
LDL Cholesterol: 67 mg/dL (ref 0–99)
NONHDL: 84.3
TRIGLYCERIDES: 88 mg/dL (ref 0.0–149.0)
Total CHOL/HDL Ratio: 2
VLDL: 17.6 mg/dL (ref 0.0–40.0)

## 2014-12-30 LAB — HEPATIC FUNCTION PANEL
ALK PHOS: 85 U/L (ref 39–117)
ALT: 22 U/L (ref 0–35)
AST: 25 U/L (ref 0–37)
Albumin: 4.2 g/dL (ref 3.5–5.2)
BILIRUBIN DIRECT: 0.1 mg/dL (ref 0.0–0.3)
Total Bilirubin: 0.3 mg/dL (ref 0.2–1.2)
Total Protein: 7.9 g/dL (ref 6.0–8.3)

## 2014-12-30 LAB — HEMOGLOBIN A1C: Hgb A1c MFr Bld: 6.7 % — ABNORMAL HIGH (ref 4.6–6.5)

## 2015-01-01 NOTE — Assessment & Plan Note (Signed)
Chronic problem.  Tolerating statin w/o difficulty.  Check labs.  Adjust meds prn  

## 2015-01-01 NOTE — Assessment & Plan Note (Signed)
Chronic problem.  Attempting to control w/ healthy diet and some exercise.  Applauded her change to low carb diet and recent weight loss.  Check labs and adjust tx plan prn.

## 2015-01-01 NOTE — Assessment & Plan Note (Signed)
New.  Ears were full of wax bilaterally.  Unable to completely curette but both ears were successfully irrigated.  Hearing improved immediately.

## 2015-01-01 NOTE — Assessment & Plan Note (Signed)
Chronic problem.  Well controlled on current medication.  Asymptomatic.  Check labs.  No anticipated med changes.

## 2015-01-03 ENCOUNTER — Telehealth: Payer: Self-pay | Admitting: Family Medicine

## 2015-01-03 NOTE — Telephone Encounter (Signed)
Caller name: Lety Relation to pt: self Call back number: [phone] Pharmacy: [facility] on [address]  Reason for call:   Patient was seen last week for ear problems. She states that her ear is still stopped up and is itching and would like to know what she should do?

## 2015-01-03 NOTE — Telephone Encounter (Signed)
Spoke with pt, she advised she was using the peroxide and was having itching and some pains. Pt was scheduled for 11:30am tomorrow morning.

## 2015-01-03 NOTE — Telephone Encounter (Signed)
Can drip peroxide into ear to dissolve wax.  If no improvement, will need return OV

## 2015-01-04 ENCOUNTER — Encounter: Payer: Self-pay | Admitting: Family Medicine

## 2015-01-04 ENCOUNTER — Ambulatory Visit (INDEPENDENT_AMBULATORY_CARE_PROVIDER_SITE_OTHER): Payer: Medicare Other | Admitting: Family Medicine

## 2015-01-04 VITALS — BP 130/78 | HR 75 | Temp 98.2°F | Resp 16 | Wt 192.0 lb

## 2015-01-04 DIAGNOSIS — H6121 Impacted cerumen, right ear: Secondary | ICD-10-CM

## 2015-01-04 DIAGNOSIS — H918X1 Other specified hearing loss, right ear: Secondary | ICD-10-CM

## 2015-01-04 NOTE — Patient Instructions (Signed)
Follow up by phone in 1-2 weeks and let me know how you're doing Continue the Claritin daily Start the Nasonex daily- 2 sprays each nostril Drink plenty of fluids Use the peroxide in the ear every other day to keep the wax soft If no improvement in 1-2 weeks, we'll refer you to ENT for evaluation and treatment Call with any questions or concerns Hang in there!!!

## 2015-01-04 NOTE — Progress Notes (Signed)
Pre visit review using our clinic review tool, if applicable. No additional management support is needed unless otherwise documented below in the visit note. 

## 2015-01-08 NOTE — Progress Notes (Signed)
   Subjective:    Patient ID: Tracy [name], female    DOB: July 15, 1946, 69 y.o.   MRN: [id number]  HPI R ear fullness- pt continues to have R ear fullness and increased wax production.  No pain.  + decreased hearing.   Review of Systems For ROS see HPI     Objective:   Physical Exam  HENT:  Head: Normocephalic and atraumatic.  L TM WNL R ear canal full of soft, brown wax.  Most removed using curette.  Thin layer of wax along TM but shiny TM visible through wax.  Vitals reviewed.         Assessment & Plan:

## 2015-01-08 NOTE — Assessment & Plan Note (Signed)
Ongoing issue for pt.  Removed copious amount of wax from R ear.  Pt's hearing sxs improved somewhat.  Suspect a lot of her excess wax production and muffled hearing are due to untreated allergic rhinitis.  Start daily OTC antihistamine and nasal steroid.  Reviewed supportive care and red flags that should prompt return.  Pt expressed understanding and is in agreement w/ plan.

## 2015-01-15 ENCOUNTER — Other Ambulatory Visit: Payer: Self-pay | Admitting: Family Medicine

## 2015-01-16 ENCOUNTER — Telehealth: Payer: Self-pay | Admitting: Family Medicine

## 2015-01-16 DIAGNOSIS — H9191 Unspecified hearing loss, right ear: Secondary | ICD-10-CM

## 2015-01-16 NOTE — Telephone Encounter (Signed)
Referral placed per last OV note  

## 2015-01-16 NOTE — Telephone Encounter (Signed)
Med filled.  

## 2015-01-16 NOTE — Telephone Encounter (Signed)
Relation to pt: self  Call back number: [phone]  Reason for call:  Pt states if ear pressure did not improve MD would refer her to a specailaist. Last OV 01/04/2015.  Pt noted she did not like the MD from [facility]. Please advise

## 2015-01-21 ENCOUNTER — Other Ambulatory Visit: Payer: Self-pay | Admitting: Family Medicine

## 2015-01-23 NOTE — Telephone Encounter (Signed)
Med filled.  

## 2015-01-26 DIAGNOSIS — E119 Type 2 diabetes mellitus without complications: Secondary | ICD-10-CM | POA: Diagnosis not present

## 2015-02-01 DIAGNOSIS — G542 Cervical root disorders, not elsewhere classified: Secondary | ICD-10-CM | POA: Diagnosis not present

## 2015-02-01 DIAGNOSIS — M2662 Arthralgia of temporomandibular joint: Secondary | ICD-10-CM | POA: Diagnosis not present

## 2015-02-01 DIAGNOSIS — H9011 Conductive hearing loss, unilateral, right ear, with unrestricted hearing on the contralateral side: Secondary | ICD-10-CM | POA: Diagnosis not present

## 2015-02-01 DIAGNOSIS — H9203 Otalgia, bilateral: Secondary | ICD-10-CM | POA: Diagnosis not present

## 2015-02-01 DIAGNOSIS — H6121 Impacted cerumen, right ear: Secondary | ICD-10-CM | POA: Diagnosis not present

## 2015-02-07 ENCOUNTER — Other Ambulatory Visit: Payer: Self-pay

## 2015-02-07 ENCOUNTER — Telehealth: Payer: Self-pay | Admitting: Family Medicine

## 2015-02-07 DIAGNOSIS — M1612 Unilateral primary osteoarthritis, left hip: Secondary | ICD-10-CM | POA: Diagnosis not present

## 2015-02-07 DIAGNOSIS — Z1231 Encounter for screening mammogram for malignant neoplasm of breast: Secondary | ICD-10-CM

## 2015-02-07 LAB — HM DIABETES EYE EXAM

## 2015-02-07 MED ORDER — MECLIZINE HCL 25 MG PO TABS: 25.0000 mg | ORAL_TABLET | Freq: Three times a day (TID) | ORAL | Status: DC | PRN

## 2015-02-07 NOTE — Telephone Encounter (Signed)
Relation to pt: self  Call back number: [phone] Pharmacy: [facility] [address] - [place], [place] - [address] AT [address] [phone] ([phone] (Fax)         Reason for call:  Pt requesting a medication refill meclizine (ANTIVERT) 50 MG tablet due to vertigo this morning

## 2015-02-07 NOTE — Telephone Encounter (Signed)
Medication filled this morning for pt.

## 2015-02-14 ENCOUNTER — Telehealth: Payer: Self-pay | Admitting: Family Medicine

## 2015-02-14 ENCOUNTER — Ambulatory Visit: Payer: Medicare Other | Admitting: Physician Assistant

## 2015-02-14 NOTE — Telephone Encounter (Signed)
Patient Name: Tracy [name] DOB: 1945-09-12 Initial Comment caller states she was stung by a bee on the backside of her upper arm Nurse Assessment Nurse: Marcelline Deist, RN, Kermit Balo Date/Time (Eastern Time): 02/14/2015 9:58:12 AM Confirm and document reason for call. If symptomatic, describe symptoms. ---Caller states she was stung by a bee Saturday on the backside of her right upper arm. It is itchy & red. May be slightly swollen. Has the patient traveled out of the country within the last 30 days? ---Not Applicable Does the patient require triage? ---Yes Related visit to physician within the last 2 weeks? ---No Does the PT have any chronic conditions? (i.e. diabetes, asthma, etc.) ---Yes List chronic conditions. ---diabetes, watching A1C Guidelines Guideline Title Affirmed Question Affirmed Notes Bee or Yellow Jacket Sting Normal local reaction to bee, wasp, or yellow jacket sting (all triage questions negative) Final Disposition User [place], RN, Lynda Comments Caller states they made her an appt., but she will call & cancel as she is not too close to the office. Will try the suggestions the nurse gave first. Disagree/Comply: Comply

## 2015-02-14 NOTE — Telephone Encounter (Signed)
Noted  

## 2015-02-27 ENCOUNTER — Encounter: Payer: Self-pay | Admitting: Family

## 2015-02-27 ENCOUNTER — Ambulatory Visit (INDEPENDENT_AMBULATORY_CARE_PROVIDER_SITE_OTHER): Payer: Medicare Other | Admitting: Family

## 2015-02-27 VITALS — BP 120/70 | HR 86 | Temp 98.5°F | Resp 16 | Ht 61.0 in | Wt 193.0 lb

## 2015-02-27 DIAGNOSIS — T63444A Toxic effect of venom of bees, undetermined, initial encounter: Secondary | ICD-10-CM

## 2015-02-27 MED ORDER — MOMETASONE FUROATE 50 MCG/ACT NA SUSP: 2.0000 | Freq: Every day | NASAL | Status: DC

## 2015-02-27 NOTE — Progress Notes (Signed)
Pre visit review using our clinic review tool, if applicable. No additional management support is needed unless otherwise documented below in the visit note. 

## 2015-02-27 NOTE — Patient Instructions (Signed)
Continue claritin. If you have significant itching you may take benadryl 25mg  once every 6 hours as needed but be careful when you take as it may cause drowsiness. Call if itching beneath the arm worsens or does not improve in the next few weeks.  Call if dizziness worsens or does not improve.

## 2015-02-27 NOTE — Progress Notes (Signed)
Subjective:    Patient ID: Tracy [name], female    DOB: November 02, 1945, 69 y.o.   MRN: [id number]  HPI  [name] is a 69 yr old female who presents today to discuss bee sting located on the back of the right  Arm.  Occurred 3 weeks ago. She reports ongoing tenderness, intermittent itching- worse at night.  Thinks she scratches in her sleep. Reports mild intermittent dizziness.  Reports that last week she deveoped a boil on her vagina.  Reports that the boil burst- and is tender but is improving.     Review of Systems  Past Medical History  Diagnosis Date  . Arthritis   . Hypertension   . Chronic bronchitis   . Myocardial infarction 2001  . Stroke   . Cancer 2000    breast cancer  . Diabetes mellitus without complication   . Hyperlipidemia     History   Social History  . Marital Status: Divorced    Spouse Name: N/A  . Number of Children: 7  . Years of Education: N/A   Occupational History  . retired    Social History Main Topics  . Smoking status: Former Smoker -- 1.00 packs/day for 20 years    Types: Cigarettes  . Smokeless tobacco: Never Used     Comment: Quit >4 years ago  . Alcohol Use: No  . Drug Use: No  . Sexual Activity: Not on file   Other Topics Concern  . Not on file   Social History Narrative    Past Surgical History  Procedure Laterality Date  . Breast surgery  2001  . Abdominal hysterectomy  1985  . Small intestine surgery      Family History  Problem Relation Age of Onset  . Diabetes Father   . Lung cancer Sister   . Colon cancer Brother   . Heart disease Brother   . Diabetes Brother   . Hypertension Brother   . Diabetes Paternal Aunt   . Stroke Maternal Grandmother   . Diabetes Paternal Grandmother   . Colon polyps Neg Hx   . Esophageal cancer Neg Hx   . Gallbladder disease Neg Hx     Allergies  Allergen Reactions  . Bacitracin-Neomycin-Polymyxin  [Neomycin-Bacitracin Zn-Polymyx] Swelling  . Nsaids Other (See Comments)   nosebleeds  . Tape Hives    Pt cannot tolerate bandaids, tape, or any other adhesives.   . Ambien [Zolpidem Tartrate]     Hives   . Amoxicillin     Rash   . Contrast Media [Iodinated Diagnostic Agents] Hives    Pt states hives with prior ct, was given benadryl to resolve  . Latex Swelling  . Prednisone     Current Outpatient Prescriptions on File Prior to Visit  Medication Sig Dispense Refill  . albuterol (PROAIR HFA) 108 (90 BASE) MCG/ACT inhaler Inhale 2 puffs into the lungs every 4 (four) hours as needed for wheezing. 1 Inhaler 6  . aspirin 81 MG tablet Take 81 mg by mouth daily.    Marland Kitchen atorvastatin (LIPITOR) 10 MG tablet TAKE 1 TABLET DAILY. 30 tablet 3  . calcium carbonate (OS-CAL) 600 MG TABS tablet Take 1,200 mg by mouth daily with breakfast.     . cyclobenzaprine (FLEXERIL) 10 MG tablet Take 1 tablet (10 mg total) by mouth 3 (three) times daily as needed for muscle spasms. 45 tablet 0  . HYDROcodone-acetaminophen (NORCO/VICODIN) 5-325 MG per tablet Take 1 tablet by mouth every 6 (six) hours  as needed for moderate pain. 60 tablet 0  . loratadine (CLARITIN) 10 MG tablet Take 10 mg by mouth daily.    . meclizine (ANTIVERT) 25 MG tablet Take 1 tablet (25 mg total) by mouth 3 (three) times daily as needed for dizziness. 30 tablet 1  . omeprazole (PRILOSEC) 40 MG capsule TAKE 1 CAPSULE 2 TIMES A DAY. 60 capsule 6  . traZODone (DESYREL) 100 MG tablet TAKE 1 TABLET BY MOUTH EVERY NIGHT AT BEDTIME 30 tablet 3  . valsartan (DIOVAN) 160 MG tablet Take 1 tablet (160 mg total) by mouth daily. 30 tablet 6  . Vitamin D, Cholecalciferol, 400 UNITS CAPS Take 2 capsules by mouth daily.     No current facility-administered medications on file prior to visit.    BP 120/70 mmHg  Pulse 86  Temp(Src) 98.5 F (36.9 C) (Oral)  Resp 16  Ht 5\' 1"  (1.549 m)  Wt 193 lb (87.544 kg)  BMI 36.49 kg/m2  SpO2 97%       Objective:   Physical Exam  Constitutional: She is oriented to person, place,  and time. She appears well-developed and well-nourished.  Eyes: EOM are normal. Pupils are equal, round, and reactive to light.  Cardiovascular: Normal rate, regular rhythm and normal heart sounds.   No murmur heard. Pulmonary/Chest: Effort normal and breath sounds normal. No respiratory distress. She has no wheezes.  Musculoskeletal: She exhibits no edema.  Neurological: She is alert and oriented to person, place, and time. She exhibits normal muscle tone.  Skin:  Two small areas of hyperpigmentation at sting sites.  No erythema, no tenderness  Psychiatric: She has a normal mood and affect. Her behavior is normal. Judgment and thought content normal.  Skin:  Small area of irritation at site of previous "boil" right groin        Assessment & Plan:

## 2015-02-28 ENCOUNTER — Other Ambulatory Visit: Payer: Self-pay | Admitting: Family Medicine

## 2015-02-28 NOTE — Telephone Encounter (Signed)
Med filled.  

## 2015-03-01 ENCOUNTER — Telehealth: Payer: Self-pay | Admitting: Family Medicine

## 2015-03-01 NOTE — Telephone Encounter (Signed)
Pt called stating Nasonex was $160 at pharmacy so she didn't get it. She said she bought Flonase OTC and wants to make sure that is ok. Please advise pt.

## 2015-03-01 NOTE — Telephone Encounter (Signed)
Flonase is a perfect substitute

## 2015-03-01 NOTE — Telephone Encounter (Signed)
Pt notified and expressed an understanding.  

## 2015-03-02 DIAGNOSIS — T63441A Toxic effect of venom of bees, accidental (unintentional), initial encounter: Secondary | ICD-10-CM | POA: Insufficient documentation

## 2015-03-02 NOTE — Assessment & Plan Note (Signed)
Advised Pt:  Continue claritin. If you have significant itching you may take benadryl 25mg  once every 6 hours as needed but be careful when you take as it may cause drowsiness. Call if itching beneath the arm worsens or does not improve in the next few weeks.

## 2015-03-06 ENCOUNTER — Other Ambulatory Visit: Payer: Self-pay | Admitting: Family Medicine

## 2015-03-06 NOTE — Telephone Encounter (Signed)
Medication filled to pharmacy as requested.   

## 2015-03-10 ENCOUNTER — Encounter: Payer: Self-pay | Admitting: Internal Medicine

## 2015-03-10 ENCOUNTER — Ambulatory Visit (INDEPENDENT_AMBULATORY_CARE_PROVIDER_SITE_OTHER): Payer: Medicare Other | Admitting: Internal Medicine

## 2015-03-10 VITALS — BP 118/60 | HR 84 | Temp 98.6°F | Resp 16 | Wt 195.0 lb

## 2015-03-10 DIAGNOSIS — J309 Allergic rhinitis, unspecified: Secondary | ICD-10-CM | POA: Diagnosis not present

## 2015-03-10 DIAGNOSIS — L739 Follicular disorder, unspecified: Secondary | ICD-10-CM | POA: Diagnosis not present

## 2015-03-10 MED ORDER — DOXYCYCLINE HYCLATE 100 MG PO TABS: 100.0000 mg | ORAL_TABLET | Freq: Two times a day (BID) | ORAL | Status: DC

## 2015-03-10 NOTE — Patient Instructions (Signed)
Fill the tub half full and add a cap full of bleach. Stir the  water extremely well and then bathe; cleansing areas with  lesions . Repeat this once a week x3. This is to eradicate recurrent skin infections.  Plain Mucinex (NOT D) for thick secretions ;force NON dairy fluids .   Nasal cleansing in the shower as discussed with lather of mild shampoo.After 10 seconds wash off lather while  exhaling through nostrils. Make sure that all residual soap is removed to prevent irritation.  Fluticasone 1 spray in each nostril twice a day as needed. Use the "crossover" technique into opposite nostril spraying toward opposite ear @ 45 degree angle, not straight up into nostril.  Use a Neti pot daily only  as needed for significant sinus congestion; going from open side to congested side . Plain Allegra (NOT D )  160 daily , Loratidine 10 mg , OR Zyrtec 10 mg @ bedtime  as needed for itchy eyes & sneezing.

## 2015-03-10 NOTE — Progress Notes (Signed)
Pre visit review using our clinic review tool, if applicable. No additional management support is needed unless otherwise documented below in the visit note. 

## 2015-03-10 NOTE — Progress Notes (Signed)
   Subjective:    Patient ID: Tracy [name], female    DOB: April 12, 1946, 69 y.o.   MRN: [id number]  HPI A month ago she was stung by a bee over the right posterior axillary area. She states that subsequently she developed a "boil" in the right groin area. She's had recurrence after that initial lesion burst. She also had 1 on her buttocks. The most recent lesions are in the left triceps area and base of the neck. She has no history of MRSA.  She has significant rhinitis which can be profuse and associated with itchy, watery eyes. She has intermittent sweats. She uses loratadine or Benadryl on an as-needed basis.  She states her most recent A1c was 6% (actually 6.7%) ; she is on no medications for diabetes.   Review of Systems  Swelling of the lips or tongue denied.  Slight shortness of breath occasionally without wheezing  No rash or urticaria noted.  Fever ,chills  denied.   Diarrhea not present.      Objective:   Physical Exam Pertinent or positive findings include: Ptosis is present bilaterally. There is marked erythema of the nasal mucosa. She has an upper partial. She has a tiny papule which appears slightly excoriated at the base the neck. She has 2 minimally erythematous papules in the triceps area. No vesicular or pustular character to these lesions.There is accentuated curvature of upper thoracic spine. Abdomen is protuberant.  General appearance :adequately nourished; in no distress. BMI 36.86  Eyes: No conjunctival inflammation or scleral icterus is present.  Oral exam:  Lips and gums are healthy appearing.There is no oropharyngeal erythema or exudate noted.   Heart:  Normal rate and regular rhythm. S1 and S2 normal without gallop, murmur, click, rub or other extra sounds    Lungs:Chest clear to auscultation; no wheezes, rhonchi,rales ,or rubs present.No increased work of breathing.   Abdomen: bowel sounds normal, soft and non-tender without masses, organomegaly or  hernias noted.   Vascular : all pulses equal ; no bruits present.  Skin:Warm & dry.  Intact without rashes ; no tenting   Lymphatic: No lymphadenopathy is noted about the head, neck, axilla   Neuro: Strength, tone & DTRs normal.        Assessment & Plan:  #1 folliculitis  #2 allergic rhinitis  Plan: See orders and recommendations

## 2015-03-14 ENCOUNTER — Telehealth: Payer: Self-pay | Admitting: Family Medicine

## 2015-03-14 NOTE — Telephone Encounter (Signed)
Caller name: Tracy [name] Relationship to patient: Self  Can be reached: [phone] Pharmacy:  Reason for call: pt says that she has a lesion on her thigh that is causing her pain. Im not showing any available appointments w/ her Dr. I offered other physicians and pt declined. She is insisting on seeing Dr. Birdie Riddle although Im not showing any openings today or tomorrow for her. Ive advised the pt of all available days/times this week.  Pt would like to know if the Dr. can "fit" her in tomorrow. Please advise.

## 2015-03-15 NOTE — Telephone Encounter (Signed)
Ok to use 11:30 tomorrow

## 2015-03-16 ENCOUNTER — Ambulatory Visit (INDEPENDENT_AMBULATORY_CARE_PROVIDER_SITE_OTHER): Payer: Medicare Other | Admitting: Family Medicine

## 2015-03-16 ENCOUNTER — Encounter: Payer: Self-pay | Admitting: Family Medicine

## 2015-03-16 VITALS — BP 118/70 | HR 85 | Temp 98.0°F | Resp 16 | Wt 195.0 lb

## 2015-03-16 DIAGNOSIS — L0292 Furuncle, unspecified: Secondary | ICD-10-CM | POA: Insufficient documentation

## 2015-03-16 DIAGNOSIS — T63441D Toxic effect of venom of bees, accidental (unintentional), subsequent encounter: Secondary | ICD-10-CM

## 2015-03-16 NOTE — Assessment & Plan Note (Signed)
Well healed.  No evidence of residual inflammation.  No infection noted at site.  Reassurance provided that her folliculitis and boil on perineum were not likely related to her bee sting.

## 2015-03-16 NOTE — Progress Notes (Signed)
Pre visit review using our clinic review tool, if applicable. No additional management support is needed unless otherwise documented below in the visit note. 

## 2015-03-16 NOTE — Progress Notes (Signed)
   Subjective:    Patient ID: Tracy [name], female    DOB: [birth year], 69 y.o.   MRN: [id number]  HPI Lesion on bottom- 'it all started after this bee sting' (on R upper arm).  Still has scab from weeks ago.  Pt reports since sting, she has been having migratory skin lesions.  Pt saw Dr Linna Darner and was started on Doxy for folliculitis 6 days ago.  Was told to take bleach baths.  Pt reports 'red areas w/ tiny whiteheads' will pop up and then resolve within 1-2 days w/ exception of larger, painful areas on bottom.  Using nasal spray daily, taking allergy med daily.   Review of Systems For ROS see HPI     Objective:   Physical Exam  Constitutional: She is oriented to person, place, and time. She appears well-developed and well-nourished. No distress.  HENT:  Head: Normocephalic and atraumatic.  Neurological: She is alert and oriented to person, place, and time.  Skin: Skin is warm and dry. No rash (no red spots or lesions noted) noted. No erythema.  Pt w/ 1 healing boil on L perineum- no fluctuance, no drainage, only mild induration remains w/ mild TTP  Vitals reviewed.         Assessment & Plan:

## 2015-03-16 NOTE — Assessment & Plan Note (Signed)
New.  Resolving.  Pt to complete abx as directed.  Pt to avoid bleach baths as she has very sensitive skin.  Reassurance provided to pt along w/ instructions on changing razors and washing washcloth or switching puff.  Pt expressed understanding and is in agreement w/ plan.

## 2015-03-16 NOTE — Patient Instructions (Signed)
Follow up as needed The boil is improving- it remains firm but it is healing.  Finish the Doxycycline as directed Avoid the bleach baths- this is very drying to your skin Make sure you are changing your razors- particularly if you have an active skin bump (shaving can spread the bacteria to the other hair follicles) Cotton underwear- this is more breathable and allows better air movement when sweating Call with any questions or concerns Hang in there!!!

## 2015-03-20 ENCOUNTER — Other Ambulatory Visit: Payer: Self-pay | Admitting: Family Medicine

## 2015-03-20 NOTE — Telephone Encounter (Signed)
Medication filled to pharmacy as requested.   

## 2015-05-09 DIAGNOSIS — M1612 Unilateral primary osteoarthritis, left hip: Secondary | ICD-10-CM | POA: Diagnosis not present

## 2015-05-09 DIAGNOSIS — M533 Sacrococcygeal disorders, not elsewhere classified: Secondary | ICD-10-CM | POA: Diagnosis not present

## 2015-05-15 ENCOUNTER — Ambulatory Visit: Payer: Medicare Other

## 2015-05-15 ENCOUNTER — Ambulatory Visit (INDEPENDENT_AMBULATORY_CARE_PROVIDER_SITE_OTHER): Payer: Medicare Other | Admitting: Family Medicine

## 2015-05-15 ENCOUNTER — Encounter: Payer: Self-pay | Admitting: Family Medicine

## 2015-05-15 VITALS — BP 122/82 | HR 81 | Temp 98.5°F | Resp 16 | Wt 192.0 lb

## 2015-05-15 DIAGNOSIS — N390 Urinary tract infection, site not specified: Secondary | ICD-10-CM | POA: Diagnosis not present

## 2015-05-15 DIAGNOSIS — R82998 Other abnormal findings in urine: Secondary | ICD-10-CM

## 2015-05-15 DIAGNOSIS — R110 Nausea: Secondary | ICD-10-CM

## 2015-05-15 DIAGNOSIS — J320 Chronic maxillary sinusitis: Secondary | ICD-10-CM | POA: Insufficient documentation

## 2015-05-15 DIAGNOSIS — J0100 Acute maxillary sinusitis, unspecified: Secondary | ICD-10-CM | POA: Diagnosis not present

## 2015-05-15 LAB — CBC WITH DIFFERENTIAL/PLATELET
BASOS ABS: 0.1 10*3/uL (ref 0.0–0.1)
Basophils Relative: 1 % (ref 0.0–3.0)
EOS ABS: 0.1 10*3/uL (ref 0.0–0.7)
Eosinophils Relative: 1 % (ref 0.0–5.0)
HCT: 32.3 % — ABNORMAL LOW (ref 36.0–46.0)
Hemoglobin: 10.4 g/dL — ABNORMAL LOW (ref 12.0–15.0)
LYMPHS ABS: 3.4 10*3/uL (ref 0.7–4.0)
Lymphocytes Relative: 41.8 % (ref 12.0–46.0)
MCHC: 32.2 g/dL (ref 30.0–36.0)
MCV: 87.6 fl (ref 78.0–100.0)
Monocytes Absolute: 0.4 10*3/uL (ref 0.1–1.0)
Monocytes Relative: 4.5 % (ref 3.0–12.0)
NEUTROS ABS: 4.3 10*3/uL (ref 1.4–7.7)
Neutrophils Relative %: 51.7 % (ref 43.0–77.0)
PLATELETS: 346 10*3/uL (ref 150.0–400.0)
RBC: 3.68 Mil/uL — ABNORMAL LOW (ref 3.87–5.11)
RDW: 14.7 % (ref 11.5–15.5)
WBC: 8.3 10*3/uL (ref 4.0–10.5)

## 2015-05-15 LAB — BASIC METABOLIC PANEL
BUN: 10 mg/dL (ref 6–23)
CO2: 28 mEq/L (ref 19–32)
Calcium: 9.1 mg/dL (ref 8.4–10.5)
Chloride: 103 mEq/L (ref 96–112)
Creatinine, Ser: 0.97 mg/dL (ref 0.40–1.20)
GFR: 72.78 mL/min (ref >60.00)
Glucose, Bld: 107 mg/dL — ABNORMAL HIGH (ref 70–99)
Potassium: 3.7 mEq/L (ref 3.5–5.1)
Sodium: 140 mEq/L (ref 135–145)

## 2015-05-15 LAB — HEPATIC FUNCTION PANEL
ALT: 11 U/L (ref 0–35)
AST: 13 U/L (ref 0–37)
Albumin: 3.8 g/dL (ref 3.5–5.2)
Alkaline Phosphatase: 83 U/L (ref 39–117)
BILIRUBIN TOTAL: 0.3 mg/dL (ref 0.2–1.2)
Bilirubin, Direct: 0 mg/dL (ref 0.0–0.3)
Total Protein: 7.5 g/dL (ref 6.0–8.3)

## 2015-05-15 LAB — POCT URINALYSIS DIPSTICK
BILIRUBIN UA: NEGATIVE
Glucose, UA: NEGATIVE
KETONES UA: NEGATIVE
NITRITE UA: NEGATIVE
PH UA: 6
Protein, UA: NEGATIVE
RBC UA: NEGATIVE
Spec Grav, UA: >=1.03
Urobilinogen, UA: 0.2

## 2015-05-15 MED ORDER — ONDANSETRON HCL 4 MG PO TABS: 4.0000 mg | ORAL_TABLET | Freq: Three times a day (TID) | ORAL | Status: DC | PRN

## 2015-05-15 MED ORDER — SULFAMETHOXAZOLE-TRIMETHOPRIM 800-160 MG PO TABS: 1.0000 | ORAL_TABLET | Freq: Two times a day (BID) | ORAL | Status: DC

## 2015-05-15 MED ORDER — MECLIZINE HCL 25 MG PO TABS: 25.0000 mg | ORAL_TABLET | Freq: Three times a day (TID) | ORAL | Status: DC | PRN

## 2015-05-15 NOTE — Patient Instructions (Addendum)
Follow up as scheduled We'll notify you of your lab results and make any changes if needed Drink plenty of fluids REST! Start the Bactrim twice daily (with food) for the sinus infection Use the Meclizine as needed for the dizziness Zofran as needed for nausea Call with any questions or concerns Hang in there!!!

## 2015-05-15 NOTE — Progress Notes (Signed)
Pre visit review using our clinic review tool, if applicable. No additional management support is needed unless otherwise documented below in the visit note. 

## 2015-05-15 NOTE — Assessment & Plan Note (Signed)
Recurrent issue for pt.  Check labs to r/o infxn, metabolic or biliary cause.  May be related to her current sinus infxn.  Start Zofran.  Reviewed supportive care and red flags that should prompt return.  Pt expressed understanding and is in agreement w/ plan.

## 2015-05-15 NOTE — Assessment & Plan Note (Signed)
Pt's sxs and PE consistent w/ infxn.  Based on her urinary sxs- will start Bactrim twice daily to cover both respiratory and urinary pathogens.  I suspect her current infxn has exacerbated her BPV.  Refill on Meclizine provided.  Reviewed supportive care and red flags that should prompt return.  Pt expressed understanding and is in agreement w/ plan.

## 2015-05-15 NOTE — Progress Notes (Signed)
   Subjective:    Patient ID: Tracy [name], female    DOB: [birth year], 69 y.o.   MRN: [id number]  HPI Nausea- pt reports she felt bad 2 weeks ago but 'then it went away'.  6 days ago developed nausea and generalized weakness.  Pt reports she had 'gurgle-y bowel' w/ some constipation.  + drowsiness.  Pt reports waves of nausea, some suprapubic pressure.  No dysuria but some cloudiness to urine.  Increased frequency of urine.  + HA.  Mild maxillary pressure.  Some dizziness when rolling over in bed (out of meclizine).  Review of Systems For ROS see HPI     Objective:   Physical Exam  Constitutional: She is oriented to person, place, and time. She appears well-developed and well-nourished. No distress.  HENT:  Head: Normocephalic and atraumatic.  Right Ear: Tympanic membrane normal.  Left Ear: Tympanic membrane normal.  Nose: Mucosal edema and rhinorrhea present. Right sinus exhibits maxillary sinus tenderness. Right sinus exhibits no frontal sinus tenderness. Left sinus exhibits maxillary sinus tenderness. Left sinus exhibits no frontal sinus tenderness.  Mouth/Throat: Uvula is midline and mucous membranes are normal. Posterior oropharyngeal erythema present. No oropharyngeal exudate.  Eyes: Conjunctivae and EOM are normal. Pupils are equal, round, and reactive to light.  Neck: Normal range of motion. Neck supple.  Cardiovascular: Normal rate, regular rhythm and normal heart sounds.   Pulmonary/Chest: Effort normal and breath sounds normal. No respiratory distress. She has no wheezes.  Abdominal: Soft. Bowel sounds are normal. She exhibits distension (mild). There is no tenderness. There is no rebound and no guarding.  Lymphadenopathy:    She has no cervical adenopathy.  Neurological: She is alert and oriented to person, place, and time.  Skin: Skin is warm and dry.  Psychiatric: She has a normal mood and affect. Her behavior is normal.  Vitals reviewed.         Assessment & Plan:

## 2015-05-16 LAB — URINE CULTURE

## 2015-05-22 ENCOUNTER — Telehealth: Payer: Self-pay | Admitting: Family Medicine

## 2015-05-22 DIAGNOSIS — H8110 Benign paroxysmal vertigo, unspecified ear: Secondary | ICD-10-CM

## 2015-05-22 DIAGNOSIS — R110 Nausea: Secondary | ICD-10-CM

## 2015-05-22 NOTE — Telephone Encounter (Signed)
Pt wants referral to Dr. [name] for GI for stomach issues/nausea from dizziness & her bowel issues. Pt wants referral to ENT for the dizziness in her head that she was here for last week. Dr. Redmond Baseman at [facility].

## 2015-05-23 DIAGNOSIS — M1612 Unilateral primary osteoarthritis, left hip: Secondary | ICD-10-CM | POA: Diagnosis not present

## 2015-05-23 NOTE — Telephone Encounter (Signed)
[facility] for referrals to both

## 2015-05-23 NOTE — Telephone Encounter (Signed)
Ok for referrals  

## 2015-05-23 NOTE — Telephone Encounter (Signed)
Medication filled to pharmacy as requested.   

## 2015-05-23 NOTE — Telephone Encounter (Signed)
Referral placed today

## 2015-05-30 ENCOUNTER — Ambulatory Visit
Admission: RE | Admit: 2015-05-30 | Discharge: 2015-05-30 | Disposition: A | Discharge status: Home / Self Care | Payer: Medicare Other | Source: Ambulatory Visit

## 2015-05-30 DIAGNOSIS — Z1231 Encounter for screening mammogram for malignant neoplasm of breast: Secondary | ICD-10-CM | POA: Diagnosis not present

## 2015-05-30 LAB — HM MAMMOGRAPHY

## 2015-05-31 ENCOUNTER — Encounter: Payer: Self-pay | Admitting: Behavioral Health

## 2015-05-31 ENCOUNTER — Telehealth: Payer: Self-pay | Admitting: Behavioral Health

## 2015-05-31 NOTE — Telephone Encounter (Signed)
Pre-Visit Call completed with patient and chart updated.   Pre-Visit Info documented in Specialty Comments under [employer].    

## 2015-06-01 ENCOUNTER — Other Ambulatory Visit: Payer: Self-pay | Admitting: Family Medicine

## 2015-06-01 ENCOUNTER — Ambulatory Visit (INDEPENDENT_AMBULATORY_CARE_PROVIDER_SITE_OTHER): Payer: Medicare Other | Admitting: Family Medicine

## 2015-06-01 VITALS — BP 126/84 | HR 84 | Temp 98.3°F | Resp 16 | Ht 61.0 in | Wt 192.2 lb

## 2015-06-01 DIAGNOSIS — H8113 Benign paroxysmal vertigo, bilateral: Secondary | ICD-10-CM | POA: Diagnosis not present

## 2015-06-01 DIAGNOSIS — Z23 Encounter for immunization: Secondary | ICD-10-CM

## 2015-06-01 DIAGNOSIS — R42 Dizziness and giddiness: Secondary | ICD-10-CM | POA: Diagnosis not present

## 2015-06-01 DIAGNOSIS — E785 Hyperlipidemia, unspecified: Secondary | ICD-10-CM | POA: Diagnosis not present

## 2015-06-01 DIAGNOSIS — H8111 Benign paroxysmal vertigo, right ear: Secondary | ICD-10-CM | POA: Diagnosis not present

## 2015-06-01 DIAGNOSIS — E119 Type 2 diabetes mellitus without complications: Secondary | ICD-10-CM

## 2015-06-01 DIAGNOSIS — Z0000 Encounter for general adult medical examination without abnormal findings: Secondary | ICD-10-CM

## 2015-06-01 DIAGNOSIS — R928 Other abnormal and inconclusive findings on diagnostic imaging of breast: Secondary | ICD-10-CM

## 2015-06-01 DIAGNOSIS — E1169 Type 2 diabetes mellitus with other specified complication: Secondary | ICD-10-CM | POA: Diagnosis not present

## 2015-06-01 DIAGNOSIS — I10 Essential (primary) hypertension: Secondary | ICD-10-CM

## 2015-06-01 LAB — LIPID PANEL
CHOLESTEROL: 166 mg/dL (ref 0–200)
HDL: 79.6 mg/dL (ref >39.00)
LDL CALC: 75 mg/dL (ref 0–99)
NONHDL: 86.01
Total CHOL/HDL Ratio: 2
Triglycerides: 57 mg/dL (ref 0.0–149.0)
VLDL: 11.4 mg/dL (ref 0.0–40.0)

## 2015-06-01 LAB — HEMOGLOBIN A1C: HEMOGLOBIN A1C: 7.1 % — AB (ref 4.6–6.5)

## 2015-06-01 LAB — TSH: TSH: 1.83 u[IU]/mL (ref 0.35–4.50)

## 2015-06-01 NOTE — Assessment & Plan Note (Signed)
Pt's PE unchanged from previous.  UTD on colonoscopy, mammo.  No need for pap.  Written screening schedule updated and given to pt.  UTD on flu, prevnar given today.  Check labs.  Anticipatory guidance provided.

## 2015-06-01 NOTE — Assessment & Plan Note (Signed)
Chronic problem.  Diet controlled.  Pt is eating a low carb diet but not getting regular exercise due to vertigo.  UTD on eye exam, foot exam.  On ARB for renal protection.  Check labs.  Start meds prn

## 2015-06-01 NOTE — Assessment & Plan Note (Signed)
Chronic problem.  Tolerating statin w/o difficulty.  Check labs.  Adjust meds prn  

## 2015-06-01 NOTE — Progress Notes (Signed)
   Subjective:    Patient ID: Tracy [name], female    DOB: 08/19/1945, 69 y.o.   MRN: [id number]  HPI Here today for CPE.  Risk Factors: DM- chronic problem, pt is controlling w/ healthy diet and regular exercise.  UTD on eye exam and foot exam.  On ARB for renal protection.  Denies CP, SOB, HAs, visual changes, edema.  + dizziness HTN- chronic problem, well controlled.  On Valsartan.   Hyperlipidemia- chronic problem, on Lipitor.  Denies abd pain, N/V.  Physical Activity: no formal exercise but active Fall Risk: low Depression: denies current sxs Hearing: normal to conversational tones, decreased to whispered voice ADL's: independent  Cognitive: normal linear thought process, memory and attention intact Home Safety: safe at home Height, Weight, BMI, Visual Acuity: see vitals, vision corrected to 20/20 w/ glasses Counseling: UTD on mammo (done 11/1), colonoscopy (due 2022), due for [facility] team reviewed and updated w/ pt Labs Ordered: See A&P Care Plan: See A&P    Review of Systems Patient reports no vision/ hearing changes, adenopathy,fever, weight change,  persistant/recurrent hoarseness , swallowing issues, chest pain, palpitations, edema, persistant/recurrent cough, hemoptysis, dyspnea (rest/exertional/paroxysmal nocturnal), gastrointestinal bleeding (melena, rectal bleeding), abdominal pain, significant heartburn, bowel changes, GU symptoms (dysuria, hematuria, incontinence), Gyn symptoms (abnormal  bleeding, pain),  syncope, focal weakness, memory loss, numbness & tingling, skin/hair/nail changes, abnormal bruising or bleeding, anxiety, or depression.   + dizzy spells- pt reports she will feel weak, flushed, anxious, short of breath and then it resolves.    Objective:   Physical Exam General Appearance:    Alert, cooperative, no distress, appears stated age, obese  Head:    Normocephalic, without obvious abnormality, atraumatic  Eyes:    PERRL, conjunctiva/corneas  clear, EOM's intact, fundi    benign, both eyes  Ears:    Normal TM's and external ear canals, both ears  Nose:   Nares normal, septum midline, mucosa normal, no drainage    or sinus tenderness  Throat:   Lips, mucosa, and tongue normal; teeth and gums normal  Neck:   Supple, symmetrical, trachea midline, no adenopathy;    Thyroid: no enlargement/tenderness/nodules  Back:     Symmetric, no curvature, ROM normal, no CVA tenderness  Lungs:     Clear to auscultation bilaterally, respirations unlabored  Chest Wall:    No tenderness or deformity   Heart:    Regular rate and rhythm, S1 and S2 normal, no murmur, rub   or gallop  Breast Exam:    Deferred to mammo  Abdomen:     Soft, non-tender, bowel sounds active all four quadrants,    no masses, no organomegaly  Genitalia:    Deferred  Rectal:    Extremities:   Extremities normal, atraumatic, no cyanosis or edema  Pulses:   2+ and symmetric all extremities  Skin:   Skin color, texture, turgor normal, no rashes or lesions  Lymph nodes:   Cervical, supraclavicular, and axillary nodes normal  Neurologic:   CNII-XII intact, normal strength, pt w/ vertigo w/ every position change          Assessment & Plan:

## 2015-06-01 NOTE — Assessment & Plan Note (Signed)
Chronic problem.  Adequate control.  Asymptomatic w/ exception of dizziness/vertigo.  Check labs.  No anticipated med changes.

## 2015-06-01 NOTE — Patient Instructions (Signed)
Follow up in 3-4 months to recheck sugar We'll notify you of your lab results and make any changes if needed Continue to work on healthy diet and regular exercise You are up to date on colonoscopy until 2022 You are up to date on mammogram Call with any questions or concerns If you want to join [place] at the new [place] office, any scheduled appointments will automatically transfer and we will see you at [address] [place] [address] [place], [place],  [address] Hang in there! Happy Thanksgiving!!!

## 2015-06-07 ENCOUNTER — Ambulatory Visit
Admission: RE | Admit: 2015-06-07 | Discharge: 2015-06-07 | Disposition: A | Discharge status: Home / Self Care | Payer: Medicare Other | Source: Ambulatory Visit | Attending: Family Medicine | Admitting: Family Medicine

## 2015-06-07 ENCOUNTER — Other Ambulatory Visit: Payer: Self-pay | Admitting: Family Medicine

## 2015-06-07 DIAGNOSIS — N6310 Unspecified lump in the right breast, unspecified quadrant: Secondary | ICD-10-CM

## 2015-06-07 DIAGNOSIS — R928 Other abnormal and inconclusive findings on diagnostic imaging of breast: Secondary | ICD-10-CM

## 2015-06-07 DIAGNOSIS — N63 Unspecified lump in breast: Secondary | ICD-10-CM | POA: Diagnosis not present

## 2015-06-13 ENCOUNTER — Ambulatory Visit
Admission: RE | Admit: 2015-06-13 | Discharge: 2015-06-13 | Disposition: A | Discharge status: Home / Self Care | Payer: Medicare Other | Source: Ambulatory Visit | Attending: Family Medicine | Admitting: Family Medicine

## 2015-06-13 ENCOUNTER — Other Ambulatory Visit: Payer: Self-pay | Admitting: Family Medicine

## 2015-06-13 DIAGNOSIS — C50811 Malignant neoplasm of overlapping sites of right female breast: Secondary | ICD-10-CM | POA: Diagnosis not present

## 2015-06-13 DIAGNOSIS — N6310 Unspecified lump in the right breast, unspecified quadrant: Secondary | ICD-10-CM

## 2015-06-13 DIAGNOSIS — C50919 Malignant neoplasm of unspecified site of unspecified female breast: Secondary | ICD-10-CM

## 2015-06-13 DIAGNOSIS — R590 Localized enlarged lymph nodes: Secondary | ICD-10-CM | POA: Diagnosis not present

## 2015-06-13 DIAGNOSIS — N63 Unspecified lump in breast: Secondary | ICD-10-CM | POA: Diagnosis not present

## 2015-06-13 HISTORY — DX: Malignant neoplasm of unspecified site of unspecified female breast: C50.919

## 2015-06-19 ENCOUNTER — Other Ambulatory Visit: Payer: Self-pay | Admitting: General Surgery

## 2015-06-19 DIAGNOSIS — C50411 Malignant neoplasm of upper-outer quadrant of right female breast: Secondary | ICD-10-CM

## 2015-06-20 ENCOUNTER — Ambulatory Visit (HOSPITAL_BASED_OUTPATIENT_CLINIC_OR_DEPARTMENT_OTHER): Payer: Medicare Other | Admitting: Hematology and Oncology

## 2015-06-20 ENCOUNTER — Encounter: Payer: Self-pay | Admitting: Hematology and Oncology

## 2015-06-20 ENCOUNTER — Telehealth: Payer: Self-pay | Admitting: Hematology and Oncology

## 2015-06-20 VITALS — BP 145/59 | HR 84 | Temp 98.6°F | Resp 19 | Ht 61.0 in | Wt 198.3 lb

## 2015-06-20 DIAGNOSIS — Z853 Personal history of malignant neoplasm of breast: Secondary | ICD-10-CM | POA: Insufficient documentation

## 2015-06-20 DIAGNOSIS — Z170 Estrogen receptor positive status [ER+]: Secondary | ICD-10-CM | POA: Diagnosis not present

## 2015-06-20 DIAGNOSIS — C50411 Malignant neoplasm of upper-outer quadrant of right female breast: Secondary | ICD-10-CM | POA: Diagnosis not present

## 2015-06-20 NOTE — Progress Notes (Signed)
[facility] NOTE  Patient Care Team: [name], MD as PCP - General (Family Medicine) [name], MD as Attending Physician (Physical Medicine and Rehabilitation) [name], MD as Consulting Physician (Orthopedic Surgery) [name], MD as Consulting Physician (Otolaryngology) [name], MD as Consulting Physician (Gastroenterology)  CHIEF COMPLAINTS/PURPOSE OF CONSULTATION:  Newly diagnosed breast cancer  HISTORY OF PRESENTING ILLNESS:  Tracy [name] 69 y.o. female is here because of recent diagnosis of  Right breast cancer. She had a previous history of left breast cancer in 2001 that was treated in [place] with lumpectomy followed by chemotherapy and adjuvant radiation. She does not remember the stage or the estrogen and progesterone and HER-2 receptors. However she did not receive adjuvant hormonal therapy. She had moved to [place] about 9 years ago. She has had annual routine mammograms which most recently revealed a 6 mm abnormality in the right breast. This was subsequently biopsied and proven to be invasive ductal carcinoma. ER 95%, PR 5%, Ki-67 10%, HER-2 negative. She is here accompanied by her daughter to discuss treatment options.  I reviewed her records extensively and collaborated the history with the patient.  SUMMARY OF ONCOLOGIC HISTORY:   Breast cancer of upper-outer quadrant of right female breast ([place])   04/16/2000 Surgery Left breast: Triple negative  invasive ductal cancer treated with lumpectomy, adjuvant chemotherapy, radiation , in [place], unknown stage   06/07/2015 Mammogram Right breast mass 6x 6 x 5 mm, right axillary lymph node with slight cortex thickening measured 5 mm  T1b N0 stage IA clinical stage   06/13/2015 Initial Diagnosis Right breast needle biopsy: Invasive ductal carcinoma, grade 1, right axillary lymph node biopsy negative , ER 95%, PR 5%, Ki-67 10%, HER-2 negative   MEDICAL HISTORY:  Past Medical  History  Diagnosis Date  . Arthritis   . Hypertension   . Chronic bronchitis ([place])   . Myocardial infarction ([place]) 2001  . Stroke ([place])   . Cancer ([place]) 2000    breast cancer  . Diabetes mellitus without complication ([place])   . Hyperlipidemia     SURGICAL HISTORY: Past Surgical History  Procedure Laterality Date  . Breast surgery  2001  . Abdominal hysterectomy  1985  . Small intestine surgery      SOCIAL HISTORY: Social History   Social History  . Marital Status: Divorced    Spouse Name: N/A  . Number of Children: 7  . Years of Education: N/A   Occupational History  . retired    Social History Main Topics  . Smoking status: Former Smoker -- 1.00 packs/day for 20 years    Types: Cigarettes  . Smokeless tobacco: Never Used     Comment: Quit >4 years ago  . Alcohol Use: No  . Drug Use: No  . Sexual Activity: Not on file   Other Topics Concern  . Not on file   Social History Narrative    FAMILY HISTORY: Family History  Problem Relation Age of Onset  . Diabetes Father   . Lung cancer Sister   . Colon cancer Brother   . Heart disease Brother   . Diabetes Brother   . Hypertension Brother   . Diabetes Paternal Aunt   . Stroke Maternal Grandmother   . Diabetes Paternal Grandmother   . Colon polyps Neg Hx   . Esophageal cancer Neg Hx   . Gallbladder disease Neg Hx     ALLERGIES:  is allergic to bacitracin-neomycin-polymyxin ; nsaids;  tape; ambien; amoxicillin; contrast media; latex; and prednisone.  MEDICATIONS:  Current Outpatient Prescriptions  Medication Sig Dispense Refill  . albuterol (PROAIR HFA) 108 (90 BASE) MCG/ACT inhaler Inhale 2 puffs into the lungs every 4 (four) hours as needed for wheezing. 1 Inhaler 6  . aspirin 81 MG tablet Take 81 mg by mouth daily.    Marland Kitchen atorvastatin (LIPITOR) 10 MG tablet TAKE 1 TABLET BY MOUTH DAILY 30 tablet 6  . calcium carbonate (OS-CAL) 600 MG TABS tablet Take 1,200 mg by mouth daily with breakfast.     .  cyclobenzaprine (FLEXERIL) 10 MG tablet Take 1 tablet (10 mg total) by mouth 3 (three) times daily as needed for muscle spasms. 45 tablet 0  . HYDROcodone-acetaminophen (NORCO/VICODIN) 5-325 MG per tablet Take 1 tablet by mouth every 6 (six) hours as needed for moderate pain. 60 tablet 0  . loratadine (CLARITIN) 10 MG tablet Take 10 mg by mouth daily.    . meclizine (ANTIVERT) 25 MG tablet Take 1 tablet (25 mg total) by mouth 3 (three) times daily as needed for dizziness. 30 tablet 1  . mometasone (NASONEX) 50 MCG/ACT nasal spray Place 2 sprays into the nose daily. 17 g 2  . omeprazole (PRILOSEC) 40 MG capsule TAKE 1 CAPSULE 2 TIMES A DAY. 60 capsule 6  . ondansetron (ZOFRAN) 4 MG tablet Take 1 tablet (4 mg total) by mouth every 8 (eight) hours as needed for nausea or vomiting. 20 tablet 0  . traMADol (ULTRAM) 50 MG tablet Take 50 mg by mouth daily. For arthritis  0  . traZODone (DESYREL) 100 MG tablet TAKE 1 TABLET BY MOUTH EVERY NIGHT AT BEDTIME 90 tablet 1  . valsartan (DIOVAN) 160 MG tablet TAKE 1 TABLET BY MOUTH DAILY 30 tablet 6  . Vitamin D, Cholecalciferol, 400 UNITS CAPS Take 2 capsules by mouth daily.     No current facility-administered medications for this visit.    REVIEW OF SYSTEMS:   Constitutional: Denies fevers, chills or abnormal night sweats Eyes: Denies blurriness of vision, double vision or watery eyes Ears, nose, mouth, throat, and face: Denies mucositis or sore throat Respiratory: Denies cough, dyspnea or wheezes Cardiovascular: Denies palpitation, chest discomfort or lower extremity swelling Gastrointestinal:  Denies nausea, heartburn or change in bowel habits Skin: Denies abnormal skin rashes Lymphatics: Denies new lymphadenopathy or easy bruising Neurological:Denies numbness, tingling or new weaknesses Behavioral/Psych: Mood is stable, no new changes  Breast:  Denies any palpable lumps or discharge All other systems were reviewed with the patient and are  negative.  PHYSICAL EXAMINATION: ECOG PERFORMANCE STATUS: 1 - Symptomatic but completely ambulatory  Filed Vitals:   06/20/15 1517  BP: 145/59  Pulse: 84  Temp: 98.6 F (37 C)  Resp: 19   Filed Weights   06/20/15 1517  Weight: 198 lb 5 oz (89.954 kg)    GENERAL:alert, no distress and comfortable SKIN: skin color, texture, turgor are normal, no rashes or significant lesions EYES: normal, conjunctiva are pink and non-injected, sclera clear OROPHARYNX:no exudate, no erythema and lips, buccal mucosa, and tongue normal  NECK: supple, thyroid normal size, non-tender, without nodularity LYMPH:  no palpable lymphadenopathy in the cervical, axillary or inguinal LUNGS: clear to auscultation and percussion with normal breathing effort HEART: regular rate & rhythm and no murmurs and no lower extremity edema ABDOMEN:abdomen soft, non-tender and normal bowel sounds Musculoskeletal:no cyanosis of digits and no clubbing  PSYCH: alert & oriented x 3 with fluent speech NEURO: no focal motor/sensory deficits  BREAST: No palpable nodules in breast. No palpable axillary or supraclavicular lymphadenopathy (exam performed in the presence of a chaperone)   LABORATORY DATA:  I have reviewed the data as listed Lab Results  Component Value Date   WBC 8.3 05/15/2015   HGB 10.4* 05/15/2015   HCT 32.3* 05/15/2015   MCV 87.6 05/15/2015   PLT 346.0 05/15/2015   Lab Results  Component Value Date   NA 140 05/15/2015   K 3.7 05/15/2015   CL 103 05/15/2015   CO2 28 05/15/2015   ASSESSMENT AND PLAN:  Breast cancer of upper-outer quadrant of right female breast ([place]) Right breast needle biopsy 06/13/2015: Invasive ductal carcinoma, grade 1, right axillary lymph node biopsy negative , ER 95%, PR 5%, Ki-67 10%, HER-2 negative , T1bN0 stage IA clinical stage.  Pathology and radiology counseling:Discussed with the patient, the details of pathology including the type of breast cancer,the clinical staging,  the significance of ER, PR and HER-2/neu receptors and the implications for treatment. After reviewing the pathology in detail, we proceeded to discuss the different treatment options between surgery, radiation, chemotherapy, antiestrogen therapies. ( Pior history of triple negative left breast cancer treated with lumpectomy, adjuvant chemotherapy, radiation , in [place], unknown stage)  Recommendations:  1. Breast conserving surgery followed by 2. Oncotype DX testing to determine if chemotherapy would be of any benefit followed by 3. Adjuvant radiation therapy followed by 4. Adjuvant antiestrogen therapy  Oncotype counseling: I discussed Oncotype DX test. I explained to the patient that this is a 21 gene panel to evaluate patient tumors DNA to calculate recurrence score. This would help determine whether patient has high risk or intermediate risk or low risk breast cancer. She understands that if her tumor was found to be high risk, she would benefit from systemic chemotherapy. If low risk, no need of chemotherapy. If she was found to be intermediate risk, we would need to evaluate the score as well as other risk factors and determine if an abbreviated chemotherapy may be of benefit.  Return to clinic after surgery to discuss final pathology report and then determine if Oncotype DX testing will need to be sent.  All questions were answered. The patient knows to call the clinic with any problems, questions or concerns.    Rulon Eisenmenger, MD 4:36 PM

## 2015-06-20 NOTE — Telephone Encounter (Signed)
New patient appt-s/w patient and gave np appt for 11/22 @ 3:45 w/Dr. [name] Referring Dr. Autumn Messing  Referral information scanned

## 2015-06-20 NOTE — Assessment & Plan Note (Addendum)
Right breast needle biopsy 06/13/2015: Invasive ductal carcinoma, grade 1, right axillary lymph node biopsy negative , ER 95%, PR 5%, Ki-67 10%, HER-2 negative , T1bN0 stage IA clinical stage.  Pathology and radiology counseling:Discussed with the patient, the details of pathology including the type of breast cancer,the clinical staging, the significance of ER, PR and HER-2/neu receptors and the implications for treatment. After reviewing the pathology in detail, we proceeded to discuss the different treatment options between surgery, radiation, chemotherapy, antiestrogen therapies. ( Pior history of triple negative left breast cancer treated with lumpectomy, adjuvant chemotherapy, radiation , in [place], unknown stage)  Recommendations:  1. Breast conserving surgery followed by 2. Oncotype DX testing to determine if chemotherapy would be of any benefit followed by 3. Adjuvant radiation therapy followed by 4. Adjuvant antiestrogen therapy  Oncotype counseling: I discussed Oncotype DX test. I explained to the patient that this is a 21 gene panel to evaluate patient tumors DNA to calculate recurrence score. This would help determine whether patient has high risk or intermediate risk or low risk breast cancer. She understands that if her tumor was found to be high risk, she would benefit from systemic chemotherapy. If low risk, no need of chemotherapy. If she was found to be intermediate risk, we would need to evaluate the score as well as other risk factors and determine if an abbreviated chemotherapy may be of benefit.  Return to clinic after surgery to discuss final pathology report and then determine if Oncotype DX testing will need to be sent.

## 2015-06-20 NOTE — Telephone Encounter (Signed)
Appointments made and avs printed for pateint °

## 2015-06-21 NOTE — Progress Notes (Signed)
Note created by Dr. Gudena during office visit, copy to patient,original to scan. 

## 2015-06-28 ENCOUNTER — Telehealth: Payer: Self-pay | Admitting: *Deleted

## 2015-06-28 NOTE — Telephone Encounter (Signed)
Patient will come to Rad Onc to sign release of information for [facility] to obtain prior treatment records.

## 2015-06-30 ENCOUNTER — Other Ambulatory Visit: Payer: Self-pay | Admitting: General Surgery

## 2015-06-30 DIAGNOSIS — C50411 Malignant neoplasm of upper-outer quadrant of right female breast: Secondary | ICD-10-CM

## 2015-07-03 ENCOUNTER — Encounter (HOSPITAL_BASED_OUTPATIENT_CLINIC_OR_DEPARTMENT_OTHER): Payer: Self-pay | Admitting: *Deleted

## 2015-07-06 ENCOUNTER — Ambulatory Visit
Admission: RE | Admit: 2015-07-06 | Discharge: 2015-07-06 | Disposition: A | Discharge status: Home / Self Care | Payer: Medicare Other | Source: Ambulatory Visit | Attending: General Surgery | Admitting: General Surgery

## 2015-07-06 DIAGNOSIS — C50411 Malignant neoplasm of upper-outer quadrant of right female breast: Secondary | ICD-10-CM

## 2015-07-06 DIAGNOSIS — C50911 Malignant neoplasm of unspecified site of right female breast: Secondary | ICD-10-CM | POA: Diagnosis not present

## 2015-07-07 ENCOUNTER — Ambulatory Visit
Admission: RE | Admit: 2015-07-07 | Discharge: 2015-07-07 | Disposition: A | Discharge status: Home / Self Care | Payer: Medicare Other | Source: Ambulatory Visit | Attending: General Surgery | Admitting: General Surgery

## 2015-07-07 ENCOUNTER — Ambulatory Visit (HOSPITAL_COMMUNITY)
Admission: RE | Admit: 2015-07-07 | Discharge: 2015-07-07 | Disposition: A | Discharge status: Home / Self Care | Payer: Medicare Other | Source: Ambulatory Visit | Attending: General Surgery | Admitting: General Surgery

## 2015-07-07 ENCOUNTER — Encounter (HOSPITAL_BASED_OUTPATIENT_CLINIC_OR_DEPARTMENT_OTHER)
Admission: RE | Disposition: A | Discharge status: Home / Self Care | Payer: Self-pay | Source: Ambulatory Visit | Attending: General Surgery

## 2015-07-07 ENCOUNTER — Ambulatory Visit (HOSPITAL_BASED_OUTPATIENT_CLINIC_OR_DEPARTMENT_OTHER): Payer: Medicare Other | Admitting: Anesthesiology

## 2015-07-07 ENCOUNTER — Ambulatory Visit (HOSPITAL_BASED_OUTPATIENT_CLINIC_OR_DEPARTMENT_OTHER)
Admission: RE | Admit: 2015-07-07 | Discharge: 2015-07-07 | Disposition: A | Discharge status: Home / Self Care | Payer: Medicare Other | Source: Ambulatory Visit | Attending: General Surgery | Admitting: General Surgery

## 2015-07-07 ENCOUNTER — Encounter (HOSPITAL_BASED_OUTPATIENT_CLINIC_OR_DEPARTMENT_OTHER): Payer: Self-pay | Admitting: *Deleted

## 2015-07-07 DIAGNOSIS — E119 Type 2 diabetes mellitus without complications: Secondary | ICD-10-CM | POA: Diagnosis not present

## 2015-07-07 DIAGNOSIS — Z923 Personal history of irradiation: Secondary | ICD-10-CM | POA: Insufficient documentation

## 2015-07-07 DIAGNOSIS — E7800 Pure hypercholesterolemia, unspecified: Secondary | ICD-10-CM | POA: Insufficient documentation

## 2015-07-07 DIAGNOSIS — I252 Old myocardial infarction: Secondary | ICD-10-CM | POA: Diagnosis not present

## 2015-07-07 DIAGNOSIS — J45909 Unspecified asthma, uncomplicated: Secondary | ICD-10-CM | POA: Diagnosis not present

## 2015-07-07 DIAGNOSIS — Z87891 Personal history of nicotine dependence: Secondary | ICD-10-CM | POA: Insufficient documentation

## 2015-07-07 DIAGNOSIS — Z79899 Other long term (current) drug therapy: Secondary | ICD-10-CM | POA: Insufficient documentation

## 2015-07-07 DIAGNOSIS — I10 Essential (primary) hypertension: Secondary | ICD-10-CM | POA: Insufficient documentation

## 2015-07-07 DIAGNOSIS — Z9221 Personal history of antineoplastic chemotherapy: Secondary | ICD-10-CM | POA: Insufficient documentation

## 2015-07-07 DIAGNOSIS — Z90711 Acquired absence of uterus with remaining cervical stump: Secondary | ICD-10-CM | POA: Diagnosis not present

## 2015-07-07 DIAGNOSIS — M1990 Unspecified osteoarthritis, unspecified site: Secondary | ICD-10-CM | POA: Insufficient documentation

## 2015-07-07 DIAGNOSIS — C50411 Malignant neoplasm of upper-outer quadrant of right female breast: Secondary | ICD-10-CM

## 2015-07-07 DIAGNOSIS — Z853 Personal history of malignant neoplasm of breast: Secondary | ICD-10-CM | POA: Insufficient documentation

## 2015-07-07 DIAGNOSIS — F419 Anxiety disorder, unspecified: Secondary | ICD-10-CM | POA: Insufficient documentation

## 2015-07-07 DIAGNOSIS — K219 Gastro-esophageal reflux disease without esophagitis: Secondary | ICD-10-CM | POA: Diagnosis not present

## 2015-07-07 DIAGNOSIS — N6011 Diffuse cystic mastopathy of right breast: Secondary | ICD-10-CM | POA: Diagnosis not present

## 2015-07-07 DIAGNOSIS — Z8673 Personal history of transient ischemic attack (TIA), and cerebral infarction without residual deficits: Secondary | ICD-10-CM | POA: Insufficient documentation

## 2015-07-07 DIAGNOSIS — Z9011 Acquired absence of right breast and nipple: Secondary | ICD-10-CM | POA: Diagnosis not present

## 2015-07-07 DIAGNOSIS — C50911 Malignant neoplasm of unspecified site of right female breast: Secondary | ICD-10-CM | POA: Diagnosis not present

## 2015-07-07 HISTORY — DX: Anxiety disorder, unspecified: F41.9

## 2015-07-07 HISTORY — PX: RADIOACTIVE SEED GUIDED PARTIAL MASTECTOMY WITH AXILLARY SENTINEL LYMPH NODE BIOPSY: SHX6520

## 2015-07-07 HISTORY — DX: Restless legs syndrome: G25.81

## 2015-07-07 LAB — POCT I-STAT, CHEM 8
BUN: 9 mg/dL (ref 6–20)
CHLORIDE: 107 mmol/L (ref 101–111)
Calcium, Ion: 1.14 mmol/L (ref 1.13–1.30)
Creatinine, Ser: 0.9 mg/dL (ref 0.44–1.00)
Glucose, Bld: 106 mg/dL — ABNORMAL HIGH (ref 65–99)
HEMATOCRIT: 33 % — AB (ref 36.0–46.0)
Hemoglobin: 11.2 g/dL — ABNORMAL LOW (ref 12.0–15.0)
POTASSIUM: 3.8 mmol/L (ref 3.5–5.1)
SODIUM: 143 mmol/L (ref 135–145)
TCO2: 23 mmol/L (ref 0–100)

## 2015-07-07 SURGERY — RADIOACTIVE SEED GUIDED PARTIAL MASTECTOMY WITH AXILLARY SENTINEL LYMPH NODE BIOPSY
Anesthesia: General | Site: Breast | Laterality: Right

## 2015-07-07 MED ORDER — ONDANSETRON HCL 4 MG/2ML IJ SOLN
INTRAMUSCULAR | Status: DC | PRN
  Administered 2015-07-07: 4 mg via INTRAVENOUS

## 2015-07-07 MED ORDER — BUPIVACAINE HCL (PF) 0.25 % IJ SOLN
INTRAMUSCULAR | Status: AC
  Filled 2015-07-07: qty 30

## 2015-07-07 MED ORDER — HYDROMORPHONE HCL 1 MG/ML IJ SOLN
0.2500 mg | INTRAMUSCULAR | Status: DC | PRN
  Administered 2015-07-07 (×3): 0.25 mg via INTRAVENOUS

## 2015-07-07 MED ORDER — PROPOFOL 10 MG/ML IV BOLUS
INTRAVENOUS | Status: DC | PRN
  Administered 2015-07-07: 200 mg via INTRAVENOUS

## 2015-07-07 MED ORDER — MIDAZOLAM HCL 2 MG/2ML IJ SOLN
1.0000 mg | INTRAMUSCULAR | Status: DC | PRN
Start: 2015-07-07 — End: 2015-07-07
  Administered 2015-07-07: 1 mg via INTRAVENOUS

## 2015-07-07 MED ORDER — FENTANYL CITRATE (PF) 100 MCG/2ML IJ SOLN
50.0000 ug | INTRAMUSCULAR | Status: AC | PRN
  Administered 2015-07-07: 25 ug via INTRAVENOUS
  Administered 2015-07-07: 50 ug via INTRAVENOUS
  Administered 2015-07-07: 25 ug via INTRAVENOUS
  Administered 2015-07-07: 50 ug via INTRAVENOUS

## 2015-07-07 MED ORDER — FENTANYL CITRATE (PF) 100 MCG/2ML IJ SOLN
INTRAMUSCULAR | Status: AC
  Filled 2015-07-07: qty 2

## 2015-07-07 MED ORDER — LIDOCAINE HCL (CARDIAC) 20 MG/ML IV SOLN
INTRAVENOUS | Status: DC | PRN
  Administered 2015-07-07: 40 mg via INTRAVENOUS

## 2015-07-07 MED ORDER — CHLORHEXIDINE GLUCONATE 4 % EX LIQD: 1.0000 "application " | Freq: Once | CUTANEOUS | Status: DC

## 2015-07-07 MED ORDER — DEXAMETHASONE SODIUM PHOSPHATE 10 MG/ML IJ SOLN
INTRAMUSCULAR | Status: AC
  Filled 2015-07-07: qty 1

## 2015-07-07 MED ORDER — LACTATED RINGERS IV SOLN
INTRAVENOUS | Status: DC
  Administered 2015-07-07: 10 mL/h via INTRAVENOUS

## 2015-07-07 MED ORDER — BUPIVACAINE HCL (PF) 0.25 % IJ SOLN
INTRAMUSCULAR | Status: DC | PRN
  Administered 2015-07-07: 23 mL

## 2015-07-07 MED ORDER — HYDROMORPHONE HCL 1 MG/ML IJ SOLN
INTRAMUSCULAR | Status: AC
  Filled 2015-07-07: qty 1

## 2015-07-07 MED ORDER — BUPIVACAINE-EPINEPHRINE (PF) 0.25% -1:200000 IJ SOLN
INTRAMUSCULAR | Status: AC
  Filled 2015-07-07: qty 30

## 2015-07-07 MED ORDER — PROMETHAZINE HCL 25 MG/ML IJ SOLN: 6.2500 mg | INTRAMUSCULAR | Status: DC | PRN

## 2015-07-07 MED ORDER — VANCOMYCIN HCL IN DEXTROSE 1-5 GM/200ML-% IV SOLN: 1000.0000 mg | INTRAVENOUS | Status: DC

## 2015-07-07 MED ORDER — MIDAZOLAM HCL 2 MG/2ML IJ SOLN
INTRAMUSCULAR | Status: AC
  Filled 2015-07-07: qty 2

## 2015-07-07 MED ORDER — TECHNETIUM TC 99M SULFUR COLLOID FILTERED
1.0000 | Freq: Once | INTRAVENOUS | Status: AC | PRN
  Administered 2015-07-07: 1 via INTRADERMAL

## 2015-07-07 MED ORDER — OXYCODONE-ACETAMINOPHEN 5-325 MG PO TABS: 1.0000 | ORAL_TABLET | ORAL | Status: DC | PRN

## 2015-07-07 MED ORDER — METHYLENE BLUE 1 % INJ SOLN
INTRAMUSCULAR | Status: AC
  Filled 2015-07-07: qty 10

## 2015-07-07 MED ORDER — SODIUM CHLORIDE 0.9 % IJ SOLN
INTRAMUSCULAR | Status: AC
  Filled 2015-07-07: qty 10

## 2015-07-07 MED ORDER — VANCOMYCIN HCL IN DEXTROSE 1-5 GM/200ML-% IV SOLN
INTRAVENOUS | Status: AC
  Filled 2015-07-07: qty 200

## 2015-07-07 MED ORDER — SCOPOLAMINE 1 MG/3DAYS TD PT72: 1.0000 | MEDICATED_PATCH | Freq: Once | TRANSDERMAL | Status: DC

## 2015-07-07 MED ORDER — OXYCODONE HCL 5 MG PO TABS
5.0000 mg | ORAL_TABLET | Freq: Once | ORAL | Status: AC
Start: 2015-07-07 — End: 2015-07-07
  Administered 2015-07-07: 5 mg via ORAL

## 2015-07-07 MED ORDER — OXYCODONE HCL 5 MG PO TABS
ORAL_TABLET | ORAL | Status: AC
  Filled 2015-07-07: qty 1

## 2015-07-07 MED ORDER — GLYCOPYRROLATE 0.2 MG/ML IJ SOLN: 0.2000 mg | Freq: Once | INTRAMUSCULAR | Status: DC | PRN

## 2015-07-07 MED ORDER — LIDOCAINE HCL (CARDIAC) 20 MG/ML IV SOLN
INTRAVENOUS | Status: AC
  Filled 2015-07-07: qty 5

## 2015-07-07 MED ORDER — ONDANSETRON HCL 4 MG/2ML IJ SOLN
INTRAMUSCULAR | Status: AC
  Filled 2015-07-07: qty 2

## 2015-07-07 MED ORDER — PROPOFOL 10 MG/ML IV BOLUS
INTRAVENOUS | Status: AC
  Filled 2015-07-07: qty 20

## 2015-07-07 SURGICAL SUPPLY — 43 items
APPLIER CLIP 9.375 MED OPEN (MISCELLANEOUS) ×2
BLADE SURG 15 STRL LF DISP TIS (BLADE) ×1 IMPLANT
BLADE SURG 15 STRL SS (BLADE) ×1
CANISTER SUC SOCK COL 7IN (MISCELLANEOUS) IMPLANT
CANISTER SUCT 1200ML W/VALVE (MISCELLANEOUS) ×2 IMPLANT
CHLORAPREP W/TINT 26ML (MISCELLANEOUS) ×2 IMPLANT
CLIP APPLIE 9.375 MED OPEN (MISCELLANEOUS) ×1 IMPLANT
COVER BACK TABLE 60X90IN (DRAPES) ×2 IMPLANT
COVER MAYO STAND STRL (DRAPES) ×2 IMPLANT
COVER PROBE W GEL 5X96 (DRAPES) ×2 IMPLANT
DECANTER SPIKE VIAL GLASS SM (MISCELLANEOUS) IMPLANT
DEVICE DUBIN W/COMP PLATE 8390 (MISCELLANEOUS) ×2 IMPLANT
DRAPE LAPAROSCOPIC ABDOMINAL (DRAPES) ×2 IMPLANT
DRAPE UTILITY XL STRL (DRAPES) ×2 IMPLANT
ELECT COATED BLADE 2.86 ST (ELECTRODE) ×2 IMPLANT
ELECT REM PT RETURN 9FT ADLT (ELECTROSURGICAL) ×2
ELECTRODE REM PT RTRN 9FT ADLT (ELECTROSURGICAL) ×1 IMPLANT
GLOVE BIO SURGEON STRL SZ7.5 (GLOVE) IMPLANT
GLOVE BIOGEL PI IND STRL 6.5 (GLOVE) ×1 IMPLANT
GLOVE BIOGEL PI INDICATOR 6.5 (GLOVE) ×1
GLOVE SURG SS PI 6.5 STRL IVOR (GLOVE) ×4 IMPLANT
GLOVE SURG SS PI 7.5 STRL IVOR (GLOVE) ×6 IMPLANT
GOWN STRL REUS W/ TWL LRG LVL3 (GOWN DISPOSABLE) ×2 IMPLANT
GOWN STRL REUS W/TWL LRG LVL3 (GOWN DISPOSABLE) ×2
KIT MARKER MARGIN INK (KITS) ×2 IMPLANT
LIQUID BAND (GAUZE/BANDAGES/DRESSINGS) ×2 IMPLANT
NDL SAFETY ECLIPSE 18X1.5 (NEEDLE) IMPLANT
NEEDLE HYPO 18GX1.5 SHARP (NEEDLE)
NEEDLE HYPO 25X1 1.5 SAFETY (NEEDLE) ×2 IMPLANT
NS IRRIG 1000ML POUR BTL (IV SOLUTION) ×2 IMPLANT
PACK BASIN DAY SURGERY FS (CUSTOM PROCEDURE TRAY) ×2 IMPLANT
PENCIL BUTTON HOLSTER BLD 10FT (ELECTRODE) ×2 IMPLANT
SHEET MEDIUM DRAPE 40X70 STRL (DRAPES) ×2 IMPLANT
SLEEVE SCD COMPRESS KNEE MED (MISCELLANEOUS) ×2 IMPLANT
SPONGE LAP 18X18 X RAY DECT (DISPOSABLE) ×2 IMPLANT
SUT MON AB 4-0 PC3 18 (SUTURE) ×4 IMPLANT
SUT SILK 2 0 SH (SUTURE) IMPLANT
SUT VICRYL 3-0 CR8 SH (SUTURE) ×2 IMPLANT
SYR CONTROL 10ML LL (SYRINGE) ×2 IMPLANT
TOWEL OR 17X24 6PK STRL BLUE (TOWEL DISPOSABLE) ×2 IMPLANT
TOWEL OR NON WOVEN STRL DISP B (DISPOSABLE) ×2 IMPLANT
TUBE CONNECTING 20X1/4 (TUBING) ×2 IMPLANT
YANKAUER SUCT BULB TIP NO VENT (SUCTIONS) ×2 IMPLANT

## 2015-07-07 NOTE — Anesthesia Postprocedure Evaluation (Signed)
Anesthesia Post Note  Patient: Tracy [name]  Procedure(s) Performed: Procedure(s) (LRB): RIGHT RADIOACTIVE SEED GUIDED PARTIAL MASTECTOMY WITH AXILLARY SENTINEL LYMPH NODE BIOPSY (Right)  Patient location during evaluation: PACU Anesthesia Type: General and Regional Level of consciousness: awake and alert Pain management: pain level controlled Vital Signs Assessment: post-procedure vital signs reviewed and stable Respiratory status: spontaneous breathing and respiratory function stable Cardiovascular status: blood pressure returned to baseline and stable Postop Assessment: no signs of nausea or vomiting Anesthetic complications: no    Last Vitals:  Filed Vitals:   07/07/15 1445 07/07/15 1500  BP: 165/64 148/63  Pulse: 68 61  Temp:    Resp: 15 13    Last Pain:  Filed Vitals:   07/07/15 1504  PainSc: 9                  [name] S

## 2015-07-07 NOTE — Anesthesia Procedure Notes (Signed)
Procedure Name: LMA Insertion Performed by: Tanieka Pownall W Pre-anesthesia Checklist: Patient identified, Emergency Drugs available, Suction available and Patient being monitored Patient Re-evaluated:Patient Re-evaluated prior to inductionOxygen Delivery Method: Circle System Utilized Preoxygenation: Pre-oxygenation with 100% oxygen Intubation Type: IV induction Ventilation: Mask ventilation without difficulty LMA: LMA inserted LMA Size: 4.0 Number of attempts: 1 Airway Equipment and Method: Bite block Placement Confirmation: positive ETCO2 Tube secured with: Tape Dental Injury: Teeth and Oropharynx as per pre-operative assessment      

## 2015-07-07 NOTE — Op Note (Signed)
07/07/2015  2:21 PM  PATIENT:  Tracy [name]  69 y.o. female  PRE-OPERATIVE DIAGNOSIS:  RIGHT BREAST CANCER  POST-OPERATIVE DIAGNOSIS:  RIGHT BREAST CANCER  PROCEDURE:  Procedure(s): RIGHT RADIOACTIVE SEED GUIDED PARTIAL MASTECTOMY WITH AXILLARY SENTINEL LYMPH NODE BIOPSY (Right)  SURGEON:  Surgeon(s) and Role:    * [name], MD - Primary  PHYSICIAN ASSISTANT:   ASSISTANTS: none   ANESTHESIA:   general  EBL:  Total I/O In: 1000 [I.V.:1000] Out: -   BLOOD ADMINISTERED:none  DRAINS: none   LOCAL MEDICATIONS USED:  MARCAINE     SPECIMEN:  Source of Specimen:  right breast tissue and sentinel nodes X 2  DISPOSITION OF SPECIMEN:  PATHOLOGY  COUNTS:  YES  TOURNIQUET:  * No tourniquets in log *  DICTATION: .Dragon Dictation   After informed consent was obtained the patient was brought to the operating room and placed in the supine position on the operating room table. After adequate induction of general anesthesia the patient's right chest, breast, and axillary area were prepped with ChloraPrep, allowed to dry, and draped in usual sterile manner. Earlier in the day the patient underwent injection of 1 mCi of technetium sulfur colloid in the subareolar position on the right. Previously an I-125 seed was placed in the lateral portion of the right breast to mark an area of invasive breast cancer. At this point the neoprobe was set to technetium. A area of radioactivity was identified in the right axilla. A small transversely oriented incision was made with a 15 blade knife overlying the area of radioactivity. The incision was carried through the skin and subcutaneous tissue sharply with electrocautery until the axilla was entered. A week and then retractor was deployed. Using the neoprobe to direct blunt hemostat dissection I was able to identify a lymph node with increased radioactivity. This lymph node was excised sharply with the electrocautery and the lymphatics were  controlled with clips. Ex vivo counts on this node were approximate 500. This was sent as sentinel node #1. A palpable lymph node was also identified in the same area and it was excised sharply with the electrocautery and the lymphatics were controlled with clips. This was sent as sentinel node #2. There were no other palpable or hot lymph nodes identified in the right axilla. The deep layer of the incision was then closed with interrupted 3-0 Vicryl stitches. The skin was then closed with a running 4-0 Monocryl subcuticular stitch. Attention was then turned to the right breast. The neoprobe was set to I-125. The area of radioactivity was readily identified in the lateral right breast. An elliptical incision was made in the skin overlying the area of radioactivity with a 15 blade knife. The incision was carried through the skin and subcutaneous tissue sharply with electrocautery. All checking the area of radioactivity frequently a circular portion of breast tissue was excised sharply around the radioactive seed. Once the specimen was removed it was oriented with the appropriate paint colors. A specimen radiograph was identified that showed the clip in seed to be near the center of the specimen. On examination of the radiograph and the specimen I felt like I might be close on the medial and superior edges so an additional medial and superior edges were excised sharply and marked appropriately. Hemostasis was achieved using the Bovie electrocautery. The wound was irrigated with saline and infiltrated with quarter percent Marcaine. The deep layer of the wound was closed with interrupted 3-0 Vicryl stitches. The skin was  then closed with interrupted 4-0 Monocryl subcuticular stitches. Dermabond dressings were applied. The patient tolerated the procedure well. At the end of the case all needle sponge and instrument counts were correct. The patient was then awakened and taken to recovery in stable condition.  PLAN OF  CARE: Discharge to home after PACU  PATIENT DISPOSITION:  PACU - hemodynamically stable.   Delay start of Pharmacological VTE agent (>24hrs) due to surgical blood loss or risk of bleeding: not applicable

## 2015-07-07 NOTE — Interval H&P Note (Signed)
History and Physical Interval Note:  07/07/2015 12:44 PM  Tracy [name]  has presented today for surgery, with the diagnosis of RIGHT BREAST CANCER  The various methods of treatment have been discussed with the patient and family. After consideration of risks, benefits and other options for treatment, the patient has consented to  Procedure(s): RIGHT RADIOACTIVE SEED GUIDED PARTIAL MASTECTOMY WITH AXILLARY SENTINEL LYMPH NODE BIOPSY (Right) as a surgical intervention .  The patient's history has been reviewed, patient examined, no change in status, stable for surgery.  I have reviewed the patient's chart and labs.  Questions were answered to the patient's satisfaction.     TOTH III,Toshiyuki Fredell S

## 2015-07-07 NOTE — Discharge Instructions (Signed)

## 2015-07-07 NOTE — Transfer of Care (Signed)
Immediate Anesthesia Transfer of Care Note  Patient: Tracy [name]  Procedure(s) Performed: Procedure(s): RIGHT RADIOACTIVE SEED GUIDED PARTIAL MASTECTOMY WITH AXILLARY SENTINEL LYMPH NODE BIOPSY (Right)  Patient Location: PACU  Anesthesia Type:General  Level of Consciousness: awake, alert  and oriented  Airway & Oxygen Therapy: Patient Spontanous Breathing and Patient connected to face mask oxygen  Post-op Assessment: Report given to RN and Post -op Vital signs reviewed and stable  Post vital signs: Reviewed and stable  Last Vitals:  Filed Vitals:   07/07/15 1245 07/07/15 1250  BP:    Pulse: 68 68  Temp:    Resp: 17 14    Complications: No apparent anesthesia complications

## 2015-07-07 NOTE — H&P (Signed)
Tracy [name] Location: [facility] Patient #: [id number] DOB: 06/01/1946 Widowed / Language: [place] / Race: Black or African American Female   History of Present Illness  Patient words: breast ca.  The patient is a 69 year old female who presents with breast cancer. We are asked to see the patient in consultation by Dr. Ammie Ferrier to evaluate her for a right breast cancer. The patient is a 69 year old black female who recently went for a routine screening mammogram. At that time she was found to have a 6 mm abnormality in the lateral aspect of the right breast. This was biopsied and came back as an invasive breast cancer. She also had one abnormal lymph node in the right axilla that was biopsied and came back benign. She denied any breast pain or discharge from the nipple. She does have a history of left-sided breast cancer treated with lumpectomy, axillary lymph node dissection, chemotherapy, and radiation therapy in 2001. This was done in [place]. She also has a family history significant for breast cancer in a niece who was genetically negative but had a triple negative cancer.   Other Problems  Anxiety Disorder Arthritis Back Pain Breast Cancer Cerebrovascular Accident Gastroesophageal Reflux Disease High blood pressure Hypercholesterolemia Lump In Breast Myocardial infarction  Past Surgical History  Breast Biopsy Bilateral. Colon Polyp Removal - Colonoscopy Hysterectomy (not due to cancer) - Partial Sentinel Lymph Node Biopsy  Diagnostic Studies History Colonoscopy 5-10 years ago Mammogram within last year  Allergies Amoxicill-Clarithro-Lansopraz *ULCER DRUGS* Neosporin *OPHTHALMIC AGENTS* Tape 1"X5yd *MEDICAL DEVICES AND SUPPLIES* PredniSONE *CORTICOSTEROIDS*  Medication History Vitamin D (Cholecalciferol) (1000UNIT Capsule, Oral) Active. Atorvastatin Calcium (10MG  Tablet, Oral) Active. Valsartan (160MG  Capsule,  Oral) Active. Calcium "900" w/D (Oral) Active. TraMADol HCl (50MG  Tablet, Oral) Active. TraZODone HCl (100MG  Tablet, Oral) Active. Meclizine HCl (12.5MG  Tablet, Oral) Active. Medications Reconciled  Pregnancy / Birth History  Age at menarche 36 years. Age of menopause 55-55 Gravida 7 Irregular periods Maternal age 53-20 Para 7    Review of Systems General Present- Night Sweats. Not Present- Appetite Loss, Chills, Fatigue, Fever, Weight Gain and Weight Loss. Skin Not Present- Change in Wart/Mole, Dryness, Hives, Jaundice, New Lesions, Non-Healing Wounds, Rash and Ulcer. HEENT Present- Seasonal Allergies. Not Present- Earache, Hearing Loss, Hoarseness, Nose Bleed, Oral Ulcers, Ringing in the Ears, Sinus Pain, Sore Throat, Visual Disturbances, Wears glasses/contact lenses and Yellow Eyes. Respiratory Present- Difficulty Breathing and Snoring. Not Present- Bloody sputum, Chronic Cough and Wheezing. Breast Present- Breast Mass. Not Present- Breast Pain, Nipple Discharge and Skin Changes. Cardiovascular Present- Difficulty Breathing Lying Down and Shortness of Breath. Not Present- Chest Pain, Leg Cramps, Palpitations, Rapid Heart Rate and Swelling of Extremities. Gastrointestinal Present- Bloating, Change in Bowel Habits and Excessive gas. Not Present- Abdominal Pain, Bloody Stool, Chronic diarrhea, Constipation, Difficulty Swallowing, Gets full quickly at meals, Hemorrhoids, Indigestion, Nausea, Rectal Pain and Vomiting. Musculoskeletal Present- Muscle Weakness. Not Present- Back Pain, Joint Pain, Joint Stiffness, Muscle Pain and Swelling of Extremities. Neurological Present- Tingling. Not Present- Decreased Memory, Fainting, Headaches, Numbness, Seizures, Tremor, Trouble walking and Weakness. Psychiatric Not Present- Anxiety, Bipolar, Change in Sleep Pattern, Depression, Fearful and Frequent crying. Endocrine Not Present- Cold Intolerance, Excessive Hunger, Hair Changes, Heat  Intolerance, Hot flashes and New Diabetes. Hematology Not Present- Easy Bruising, Excessive bleeding, Gland problems, HIV and Persistent Infections.  Vitals  Weight: 198 lb Height: 57in Body Surface Area: 1.79 m Body Mass Index: 42.85 kg/m  Temp.: 92F(Temporal)  Pulse: 75 (Regular)  BP:  136/78 (Sitting, Left Arm, Standard)       Physical Exam  General Mental Status-Alert. General Appearance-Consistent with stated age. Hydration-Well hydrated. Voice-Normal.  Head and Neck Head-normocephalic, atraumatic with no lesions or palpable masses. Trachea-midline. Thyroid Gland Characteristics - normal size and consistency.  Eye Eyeball - Bilateral-Extraocular movements intact. Sclera/Conjunctiva - Bilateral-No scleral icterus.  Chest and Lung Exam Chest and lung exam reveals -quiet, even and easy respiratory effort with no use of accessory muscles and on auscultation, normal breath sounds, no adventitious sounds and normal vocal resonance. Inspection Chest Wall - Normal. Back - normal.  Breast Note: There is no palpable mass in either breast. There is no palpable axillary, supraclavicular, or cervical lymphadenopathy.   Cardiovascular Cardiovascular examination reveals -normal heart sounds, regular rate and rhythm with no murmurs and normal pedal pulses bilaterally.  Abdomen Inspection Inspection of the abdomen reveals - No Hernias. Skin - Scar - no surgical scars. Palpation/Percussion Palpation and Percussion of the abdomen reveal - Soft, Non Tender, No Rebound tenderness, No Rigidity (guarding) and No hepatosplenomegaly. Auscultation Auscultation of the abdomen reveals - Bowel sounds normal.  Neurologic Neurologic evaluation reveals -alert and oriented x 3 with no impairment of recent or remote memory. Mental Status-Normal.  Musculoskeletal Normal Exam - Left-Upper Extremity Strength Normal and Lower Extremity Strength  Normal. Normal Exam - Right-Upper Extremity Strength Normal and Lower Extremity Strength Normal.  Lymphatic Head & Neck  General Head & Neck Lymphatics: Bilateral - Description - Normal. Axillary  General Axillary Region: Bilateral - Description - Normal. Tenderness - Non Tender. Femoral & Inguinal  Generalized Femoral & Inguinal Lymphatics: Bilateral - Description - Normal. Tenderness - Non Tender.    Assessment & Plan  BREAST CANCER OF UPPER-OUTER QUADRANT OF RIGHT FEMALE BREAST (C50.411) Impression: The patient appears to have a small stage I cancer in the upper outer quadrant of the right breast. I have talked her in detail about the different options for treatment and at this point she favors breast conservation. I think this is a very reasonable way to treat her breast cancer. I would plan for a right breast radioactive seed localized lumpectomy and sentinel mode mapping. I have discussed with her in detail the risks and benefits of the operation to do this as well as some of the technical aspects and she understands and wishes to proceed. I will go ahead and refer her to medical oncology as well. I will let them decide if she needs genetic testing also. Current Plans Referred to Oncology, for evaluation and follow up (Oncology). Routine. Referred to Radiation Oncology, for evaluation and follow up (Radiation Oncology). Routine.   Signed by Luella Cook, MD

## 2015-07-07 NOTE — Progress Notes (Signed)
Assisted Dr. Rose with right, ultrasound guided, pectoralis block. Side rails up, monitors on throughout procedure. See vital signs in flow sheet. Tolerated Procedure well. 

## 2015-07-07 NOTE — Anesthesia Preprocedure Evaluation (Signed)
Anesthesia Evaluation  Patient identified by MRN, date of birth, ID band Patient awake    Reviewed: Allergy & Precautions, NPO status , Patient's Chart, lab work & pertinent test results  Airway Mallampati: II  TM Distance: >3 FB Neck ROM: Full    Dental no notable dental hx.    Pulmonary asthma , former smoker,    Pulmonary exam normal breath sounds clear to auscultation       Cardiovascular hypertension, Pt. on medications + Past MI  Normal cardiovascular exam Rhythm:Regular Rate:Normal     Neuro/Psych TIANo Residual Symptoms negative psych ROS   GI/Hepatic Neg liver ROS, GERD  Medicated,  Endo/Other  diabetes  Renal/GU negative Renal ROS  negative genitourinary   Musculoskeletal negative musculoskeletal ROS (+)   Abdominal   Peds negative pediatric ROS (+)  Hematology negative hematology ROS (+)   Anesthesia Other Findings   Reproductive/Obstetrics negative OB ROS                             Anesthesia Physical Anesthesia Plan  ASA: III  Anesthesia Plan: General   Post-op Pain Management: GA combined w/ Regional for post-op pain   Induction: Intravenous  Airway Management Planned: LMA  Additional Equipment:   Intra-op Plan:   Post-operative Plan: Extubation in OR  Informed Consent: I have reviewed the patients History and Physical, chart, labs and discussed the procedure including the risks, benefits and alternatives for the proposed anesthesia with the patient or authorized representative who has indicated his/her understanding and acceptance.   Dental advisory given  Plan Discussed with: CRNA and Surgeon  Anesthesia Plan Comments:         Anesthesia Quick Evaluation

## 2015-07-10 ENCOUNTER — Encounter (HOSPITAL_BASED_OUTPATIENT_CLINIC_OR_DEPARTMENT_OTHER): Payer: Self-pay | Admitting: General Surgery

## 2015-07-11 ENCOUNTER — Other Ambulatory Visit: Payer: Self-pay | Admitting: *Deleted

## 2015-07-11 ENCOUNTER — Telehealth: Payer: Self-pay | Admitting: Hematology and Oncology

## 2015-07-11 NOTE — Telephone Encounter (Signed)
Appointments made and patient has been called

## 2015-07-12 ENCOUNTER — Other Ambulatory Visit: Payer: Self-pay

## 2015-07-12 DIAGNOSIS — C50411 Malignant neoplasm of upper-outer quadrant of right female breast: Secondary | ICD-10-CM

## 2015-07-13 ENCOUNTER — Encounter: Payer: Self-pay | Admitting: Genetic Counselor

## 2015-07-13 ENCOUNTER — Other Ambulatory Visit (HOSPITAL_BASED_OUTPATIENT_CLINIC_OR_DEPARTMENT_OTHER): Payer: Medicare Other

## 2015-07-13 ENCOUNTER — Ambulatory Visit (HOSPITAL_BASED_OUTPATIENT_CLINIC_OR_DEPARTMENT_OTHER): Payer: Medicare Other | Admitting: Genetic Counselor

## 2015-07-13 DIAGNOSIS — Z803 Family history of malignant neoplasm of breast: Secondary | ICD-10-CM

## 2015-07-13 DIAGNOSIS — Z8489 Family history of other specified conditions: Secondary | ICD-10-CM

## 2015-07-13 DIAGNOSIS — Z809 Family history of malignant neoplasm, unspecified: Secondary | ICD-10-CM

## 2015-07-13 DIAGNOSIS — C50411 Malignant neoplasm of upper-outer quadrant of right female breast: Secondary | ICD-10-CM | POA: Diagnosis not present

## 2015-07-13 DIAGNOSIS — Z853 Personal history of malignant neoplasm of breast: Secondary | ICD-10-CM

## 2015-07-13 DIAGNOSIS — Z801 Family history of malignant neoplasm of trachea, bronchus and lung: Secondary | ICD-10-CM | POA: Diagnosis not present

## 2015-07-13 DIAGNOSIS — Z808 Family history of malignant neoplasm of other organs or systems: Secondary | ICD-10-CM

## 2015-07-13 LAB — COMPREHENSIVE METABOLIC PANEL WITH GFR
ALT: 12 U/L (ref 0–55)
AST: 16 U/L (ref 5–34)
Albumin: 3.3 g/dL — ABNORMAL LOW (ref 3.5–5.0)
Alkaline Phosphatase: 99 U/L (ref 40–150)
Anion Gap: 9 meq/L (ref 3–11)
BUN: 7.9 mg/dL (ref 7.0–26.0)
CO2: 25 meq/L (ref 22–29)
Calcium: 8.9 mg/dL (ref 8.4–10.4)
Chloride: 105 meq/L (ref 98–109)
Creatinine: 0.9 mg/dL (ref 0.6–1.1)
EGFR: 71 ml/min/1.73 m2 — ABNORMAL LOW
Glucose: 85 mg/dL (ref 70–140)
Potassium: 3.9 meq/L (ref 3.5–5.1)
Sodium: 139 meq/L (ref 136–145)
Total Bilirubin: <0.3 mg/dL (ref 0.20–1.20)
Total Protein: 7.6 g/dL (ref 6.4–8.3)

## 2015-07-13 LAB — CBC WITH DIFFERENTIAL/PLATELET
BASO%: 0.2 % (ref 0.0–2.0)
Basophils Absolute: 0 10e3/uL (ref 0.0–0.1)
EOS%: 1 % (ref 0.0–7.0)
Eosinophils Absolute: 0.1 10e3/uL (ref 0.0–0.5)
HCT: 30.2 % — ABNORMAL LOW (ref 34.8–46.6)
HGB: 9.7 g/dL — ABNORMAL LOW (ref 11.6–15.9)
LYMPH%: 43.9 % (ref 14.0–49.7)
MCH: 27.9 pg (ref 25.1–34.0)
MCHC: 32.1 g/dL (ref 31.5–36.0)
MCV: 86.8 fL (ref 79.5–101.0)
MONO#: 0.5 10e3/uL (ref 0.1–0.9)
MONO%: 5.8 % (ref 0.0–14.0)
NEUT#: 4.1 10e3/uL (ref 1.5–6.5)
NEUT%: 49.1 % (ref 38.4–76.8)
Platelets: 317 10e3/uL (ref 145–400)
RBC: 3.48 10e6/uL — ABNORMAL LOW (ref 3.70–5.45)
RDW: 14.5 % (ref 11.2–14.5)
WBC: 8.3 10e3/uL (ref 3.9–10.3)
lymph#: 3.7 10e3/uL — ABNORMAL HIGH (ref 0.9–3.3)

## 2015-07-13 NOTE — Progress Notes (Signed)
REFERRING PROVIDER: [name], MD  PRIMARY PROVIDER:  [name], MD  PRIMARY REASON FOR VISIT:  1. Breast cancer of upper-outer quadrant of right female breast ([place])   2. History of left breast cancer   3. Family history of breast cancer   4. Family history of lung cancer   5. Family history of brain tumor   6. Family history of cancer   7. Family history of cancer in daughter      HISTORY OF PRESENT ILLNESS:   Tracy [name], a 69 y.o. female, was seen for a [place] cancer genetics consultation at the request of Tracy [name] due to a personal history of metachronous bilateral breast cancers and family history of breast and other cancers.  Tracy [name] presents to clinic today with her oldest daughter, Tracy [name], to discuss the possibility of a hereditary predisposition to cancer, genetic testing, and to further clarify her future cancer risks, as well as potential cancer risks for family members.   In 2016, at the age of 25, Tracy [name] was diagnosed with invasive ductal carcinoma with DCIS of the right breast.  Hormone receptor status was ER/PR+, Her2-. This was treated with right lumpectomy on July 07, 2015.  Tracy [name] will begin radiation therapy in January 2017.  She also has a previous history of left breast cancer diagnosed in 2001 at the age of 71.  This was treated with left lumpectomy, chemotherapy and radiation.    CANCER HISTORY:    Breast cancer of upper-outer quadrant of right female breast ([place])   04/16/2000 Surgery Left breast: Triple negative  invasive ductal cancer treated with lumpectomy, adjuvant chemotherapy, radiation , in [place], unknown stage   06/07/2015 Mammogram Right breast mass 6x 6 x 5 mm, right axillary lymph node with slight cortex thickening measured 5 mm  T1b N0 stage IA clinical stage   06/13/2015 Initial Diagnosis Right breast needle biopsy: Invasive ductal carcinoma, grade 1, right axillary lymph node biopsy negative , ER 95%, PR 5%, Ki-67 10%, HER-2  negative     HORMONAL RISK FACTORS:  Menarche was at age 12-11.  First live birth at age 74.  OCP use for approximately hx of IUD (unknown type) for less than 1 year  Ovaries intact: still has "a piece of an ovary left"; previously had hysterectomy due to a non-cancerous tumor.  Hysterectomy: yes.  Menopausal status: postmenopausal.  HRT use: 0 years. Colonoscopy: yes; normal in 2010 and previously. Mammogram within the last year: yes. Number of breast biopsies: 2. Up to date with pelvic exams:  n/a. Any excessive radiation exposure in the past:  no  Past Medical History  Diagnosis Date  . Arthritis   . Hypertension   . Chronic bronchitis ([place])   . Myocardial infarction ([place]) 2001  . Cancer ([place]) 2000    breast cancer  . Hyperlipidemia   . Stroke [facility]) 2004    TIA, no deficits  . Chronic bronchitis ([place])   . Restless leg   . Anxiety     Past Surgical History  Procedure Laterality Date  . Abdominal hysterectomy  1985  . Small intestine surgery    . Tubal ligation    . Breast surgery  2001    lt breast lumpectomy  . Radioactive seed guided mastectomy with axillary sentinel lymph node biopsy Right 07/07/2015    Procedure: RIGHT RADIOACTIVE SEED GUIDED PARTIAL MASTECTOMY WITH AXILLARY SENTINEL LYMPH NODE BIOPSY;  Surgeon: Autumn Messing III, MD;  Location: [facility];  Service: General;  Laterality: Right;    Social History   Social History  . Marital Status: Divorced    Spouse Name: N/A  . Number of Children: 7  . Years of Education: N/A   Occupational History  . retired    Social History Main Topics  . Smoking status: Former Smoker -- 1.00 packs/day for 20 years    Types: Cigarettes  . Smokeless tobacco: Never Used     Comment: Quit >4 years ago; 1 ppd for about 5/20 years (remaining was less)  . Alcohol Use: No     Comment: none now; previously occ (some weekends)  . Drug Use: No  . Sexual Activity: Not Asked   Other Topics Concern  . None    Social History Narrative     FAMILY HISTORY:  We obtained a detailed, 4-generation family history.  Significant diagnoses are listed below: Family History  Problem Relation Age of Onset  . Diabetes Father   . Lung cancer Sister     dx. <50; former smoker  . Diabetes Brother   . Diabetes Paternal Aunt   . Stroke Maternal Grandmother   . Diabetes Paternal Grandmother   . Colon polyps Neg Hx   . Esophageal cancer Neg Hx   . Gallbladder disease Neg Hx   . Emphysema Mother 56    smoker  . Diabetes Brother   . Brain cancer Brother 56    unknown tumor type  . Cancer Daughter 45    neck cancer  . Other Daughter     hysterectomy for unspecified reason  . Breast cancer Cousin   . Cancer Cousin     unspecified type  . Breast cancer Other     triple negative breast cancer in her 24s    Tracy [name] has seven daughters, all between the ages of 47 and 8.  One daughter, Tracy [name], who is 7 was diagnosed with a neck cancer at the age of 5.  Another daughter, Tracy [name], underwent a hysterectomy for an unspecified reason.  Tracy [name] has multiple grandchildren, none of whom have ever been diagnosed with childhood cancers or tumors.  She has three full sisters and three full brothers.  Two brothers have passed away--one with a brain tumor (unspecified type) at 79 and one with diabetes-related illness at 68.  Her other brother is currently 39 and has never had cancer.  Her sisters are currently all in their 82s; one sister, a former smoker, was diagnosed with lung cancer at an age younger than 63.  One niece was diagnosed with a triple negative breast cancer in her 43s.  This niece has two sisters and one brother, none of whom have had cancer.    Tracy [name]'s mother passed away at 76 with emphysema; she was a smoker.  She had one full brother, 10 full sisters, and one maternal half-brother.  None of these siblings were every diagnosed with cancer, to Tracy [name]'s knowledge.  All of her maternal aunts and uncles  have passed away except two aunts who are currently in their 27s and 19s.  One maternal first cousin was diagnosed with breast cancer and her sister died of an unspecified type of cancer.  Tracy [name] is unaware of any additional cancers for other maternal first cousins.  Her maternal grandparents passed away in their 66s.  She has limited information for any maternal great aunts/uncles or great grandparents.  Tracy [name]'s father passed away at 57, but never had cancer.  He had  three full brothers and 58 full sisters, none of whom had cancer.  Ms. Kingsberry has limited information for her paternal first cousins.  She is also unaware at what ages her paternal grandparents passed away, but knows that her grandfather died when her father was still young.  She has no further information for any paternal great aunts/uncles or great grandparents.    Tracy [name] is unaware of any previous genetic testing in the family.  Patient's maternal  and paternal ancestors are of Native [place], [place] American, and/or Caucasian/Irish descent. There is no reported Ashkenazi Jewish ancestry. There is no known consanguinity.  GENETIC COUNSELING ASSESSMENT: Tracy [name] is a 69 y.o. female with a personal and family history of cancer which is somewhat suggestive of a hereditary breast cancer syndrome and predisposition to cancer. We, therefore, discussed and recommended the following at today's visit.   DISCUSSION: We reviewed the characteristics, features and inheritance patterns of hereditary cancer syndromes, particularly those caused by mutations within the BRCA1/2 genes. We also discussed genetic testing, including the appropriate family members to test, the process of testing, insurance coverage and turn-around-time for results.  We discussed the benefits of larger and smaller panel tests.  We discussed the implications of a negative, positive and/or variant of uncertain significant result. Tracy [name] would like to proceed with  smaller panel testing.  Thus, we recommended Tracy [name] pursue genetic testing for the 8-gene Breast Cancer High/Moderate Risk Panel through [facility].  The Breast High/Moderate Risk gene panel offered by [facility] includes sequencing and deletion/duplication analysis of the following 8 genes: ATM, BRCA1, BRCA2, CDH1, CHEK2, PALB2, PTEN, and TP53.     Based on Tracy [name]'s personal and family history of cancer, she meets medical criteria for genetic testing. Despite that she meets criteria, she may still have an out of pocket cost. We discussed that if her out of pocket cost for testing is over $100, the laboratory will call and confirm whether she wants to proceed with testing.  If the out of pocket cost of testing is less than $100 she will be billed by the genetic testing laboratory.   PLAN: After considering the risks, benefits, and limitations, Tracy [name]  provided informed consent to pursue genetic testing and the blood sample was sent to [facility] for analysis of the 8-gene Breast Cancer High/Moderate Risk Panel. Results should be available within approximately 2-3 weeks' time, at which point they will be disclosed by telephone to Tracy [name], as will any additional recommendations warranted by these results. Tracy [name] will receive a summary of her genetic counseling visit and a copy of her results once available. This information will also be available in Epic. We encouraged Ms. Harshberger to remain in contact with cancer genetics annually so that we can continuously update the family history and inform her of any changes in cancer genetics and testing that may be of benefit for her family. Ms. Sudberry's questions were answered to her satisfaction today. Our contact information was provided should additional questions or concerns arise.  Thank you for the referral and allowing [place] to share in the care of your patient.   Jeanine Luz, MS Genetic Counselor [email or web address] Phone:  [phone]  The patient was seen for a total of 60 minutes in face-to-face genetic counseling.  This patient was discussed with Drs. Magrinat, Lindi [name] and/or Burr Medico who agrees with the above.    _______________________________________________________________________ For Office Staff:  Number of people involved in session: 2 Was an Intern/ student involved  with case: no

## 2015-07-14 ENCOUNTER — Encounter: Payer: Self-pay | Admitting: Hematology and Oncology

## 2015-07-14 ENCOUNTER — Ambulatory Visit (HOSPITAL_BASED_OUTPATIENT_CLINIC_OR_DEPARTMENT_OTHER): Payer: Medicare Other | Admitting: Hematology and Oncology

## 2015-07-14 VITALS — BP 137/62 | HR 67 | Temp 98.8°F | Resp 18 | Ht 61.0 in | Wt 195.7 lb

## 2015-07-14 DIAGNOSIS — C50411 Malignant neoplasm of upper-outer quadrant of right female breast: Secondary | ICD-10-CM

## 2015-07-14 DIAGNOSIS — Z171 Estrogen receptor negative status [ER-]: Secondary | ICD-10-CM

## 2015-07-14 NOTE — Progress Notes (Signed)
Patient Care Team: [name], MD as PCP - General (Family Medicine) [name], MD as Attending Physician (Physical Medicine and Rehabilitation) [name], MD as Consulting Physician (Orthopedic Surgery) [name], MD as Consulting Physician (Otolaryngology) [name], MD as Consulting Physician (Gastroenterology)  DIAGNOSIS: No matching staging information was found for the patient.  SUMMARY OF ONCOLOGIC HISTORY:   Breast cancer of upper-outer quadrant of right female breast ([place])   04/16/2000 Surgery Left breast: Triple negative  invasive ductal cancer treated with lumpectomy, adjuvant chemotherapy, radiation , in [place], unknown stage   06/07/2015 Mammogram Right breast mass 6x 6 x 5 mm, right axillary lymph node with slight cortex thickening measured 5 mm  T1b N0 stage IA clinical stage   06/13/2015 Initial Diagnosis Right breast needle biopsy: Invasive ductal carcinoma, grade 1, right axillary lymph node biopsy negative , ER 95%, PR 5%, Ki-67 10%, HER-2 negative   07/07/2015 Surgery Right lumpectomy: Invasive ductal carcinoma grade 1, 1 cm span, with low-grade DCIS, DCIS focally 0.1 cm to inferior margin, 0/3 lymph nodes negative, ER 95%, PR 5%, HER-2 negative ratio 1.1, Ki-67 10%, T1 cN0 stage IA    CHIEF COMPLIANT: Follow-up after right lumpectomy  INTERVAL HISTORY: Tracy [name] is a 69 year old with above-mentioned history of recent right lumpectomy who is here today to discuss the pathology report. She is healing very well from the surgery and reports no major problems or concerns. Her daughter could not come because she is still working. Her daughter would like to send [place] FMLA paperwork.  REVIEW OF SYSTEMS:   Constitutional: Denies fevers, chills or abnormal weight loss Eyes: Denies blurriness of vision Ears, nose, mouth, throat, and face: Denies mucositis or sore throat Respiratory: Denies cough, dyspnea or wheezes Cardiovascular: Denies palpitation,  chest discomfort or lower extremity swelling Gastrointestinal:  Denies nausea, heartburn or change in bowel habits Skin: Denies abnormal skin rashes Lymphatics: Denies new lymphadenopathy or easy bruising Neurological:Denies numbness, tingling or new weaknesses Behavioral/Psych: Mood is stable, no new changes  Breast: Mildly sore from recent surgery All other systems were reviewed with the patient and are negative.  I have reviewed the past medical history, past surgical history, social history and family history with the patient and they are unchanged from previous note.  ALLERGIES:  is allergic to bacitracin-neomycin-polymyxin ; nsaids; tape; ambien; amoxicillin; contrast media; latex; and prednisone.  MEDICATIONS:  Current Outpatient Prescriptions  Medication Sig Dispense Refill  . albuterol (PROAIR HFA) 108 (90 BASE) MCG/ACT inhaler Inhale 2 puffs into the lungs every 4 (four) hours as needed for wheezing. 1 Inhaler 6  . aspirin 81 MG tablet Take 81 mg by mouth daily.    Marland Kitchen atorvastatin (LIPITOR) 10 MG tablet TAKE 1 TABLET BY MOUTH DAILY 30 tablet 6  . calcium carbonate (OS-CAL) 600 MG TABS tablet Take 1,200 mg by mouth daily with breakfast.     . cyclobenzaprine (FLEXERIL) 10 MG tablet Take 1 tablet (10 mg total) by mouth 3 (three) times daily as needed for muscle spasms. 45 tablet 0  . HYDROcodone-acetaminophen (NORCO/VICODIN) 5-325 MG per tablet Take 1 tablet by mouth every 6 (six) hours as needed for moderate pain. 60 tablet 0  . loratadine (CLARITIN) 10 MG tablet Take 10 mg by mouth daily.    . meclizine (ANTIVERT) 25 MG tablet Take 1 tablet (25 mg total) by mouth 3 (three) times daily as needed for dizziness. 30 tablet 1  . mometasone (NASONEX) 50 MCG/ACT nasal spray Place 2  sprays into the nose daily. 17 g 2  . omeprazole (PRILOSEC) 40 MG capsule TAKE 1 CAPSULE 2 TIMES A DAY. 60 capsule 6  . ondansetron (ZOFRAN) 4 MG tablet Take 1 tablet (4 mg total) by mouth every 8 (eight) hours  as needed for nausea or vomiting. 20 tablet 0  . oxyCODONE-acetaminophen (ROXICET) 5-325 MG tablet Take 1-2 tablets by mouth every 4 (four) hours as needed. 50 tablet 0  . traMADol (ULTRAM) 50 MG tablet Take 50 mg by mouth daily. For arthritis  0  . traZODone (DESYREL) 100 MG tablet TAKE 1 TABLET BY MOUTH EVERY NIGHT AT BEDTIME 90 tablet 1  . valsartan (DIOVAN) 160 MG tablet TAKE 1 TABLET BY MOUTH DAILY 30 tablet 6  . Vitamin D, Cholecalciferol, 400 UNITS CAPS Take 2 capsules by mouth daily.     No current facility-administered medications for this visit.    PHYSICAL EXAMINATION: ECOG PERFORMANCE STATUS: 1 - Symptomatic but completely ambulatory  Filed Vitals:   07/14/15 1011  BP: 137/62  Pulse: 67  Temp: 98.8 F (37.1 C)  Resp: 18   Filed Weights   07/14/15 1011  Weight: 195 lb 11.2 oz (88.769 kg)    GENERAL:alert, no distress and comfortable SKIN: skin color, texture, turgor are normal, no rashes or significant lesions EYES: normal, Conjunctiva are pink and non-injected, sclera clear OROPHARYNX:no exudate, no erythema and lips, buccal mucosa, and tongue normal  NECK: supple, thyroid normal size, non-tender, without nodularity LYMPH:  no palpable lymphadenopathy in the cervical, axillary or inguinal LUNGS: clear to auscultation and percussion with normal breathing effort HEART: regular rate & rhythm and no murmurs and no lower extremity edema ABDOMEN:abdomen soft, non-tender and normal bowel sounds Musculoskeletal:no cyanosis of digits and no clubbing  NEURO: alert & oriented x 3 with fluent speech, no focal motor/sensory deficits  LABORATORY DATA:  I have reviewed the data as listed   Chemistry      Component Value Date/Time   NA 139 07/13/2015 1516   NA 143 07/07/2015 1120   K 3.9 07/13/2015 1516   K 3.8 07/07/2015 1120   CL 107 07/07/2015 1120   CO2 25 07/13/2015 1516   CO2 28 05/15/2015 1203   BUN 7.9 07/13/2015 1516   BUN 9 07/07/2015 1120   CREATININE 0.9  07/13/2015 1516   CREATININE 0.90 07/07/2015 1120   CREATININE 0.95 05/10/2014 1147      Component Value Date/Time   CALCIUM 8.9 07/13/2015 1516   CALCIUM 9.1 05/15/2015 1203   ALKPHOS 99 07/13/2015 1516   ALKPHOS 83 05/15/2015 1203   AST 16 07/13/2015 1516   AST 13 05/15/2015 1203   ALT 12 07/13/2015 1516   ALT 11 05/15/2015 1203   BILITOT <0.30 07/13/2015 1516   BILITOT 0.3 05/15/2015 1203       Lab Results  Component Value Date   WBC 8.3 07/13/2015   HGB 9.7* 07/13/2015   HCT 30.2* 07/13/2015   MCV 86.8 07/13/2015   PLT 317 07/13/2015   NEUTROABS 4.1 07/13/2015   ASSESSMENT & PLAN:  Breast cancer of upper-outer quadrant of right female breast ([place]) Right lumpectomy 07/07/2015: Invasive ductal carcinoma grade 1, 1 cm span, with low-grade DCIS, DCIS focally 0.1 cm to inferior margin, 0/3 lymph nodes negative, ER 95%, PR 5%, HER-2 negative ratio 1.1, Ki-67 10%, T1 cN0 stage IA ( [facility] history of triple negative left breast cancer treated with lumpectomy, adjuvant chemotherapy, radiation , in [place], unknown stage 04/16/2000)  Pathology review:  I discussed the final pathology report and provided a copy to the patient. I discussed the significance of margins especially the inferior margin which was close for DCIS. I explained the difference between a close margin and a positive margin. We will be discussing her case in the tumor board regarding margin issue.  Recommendations:  1. Oncotype DX testing to determine if chemotherapy would be of any benefit followed by 2. Adjuvant radiation therapy followed by 3. Adjuvant antiestrogen therapy   Return to clinic based upon Oncotype DX score result.   No orders of the defined types were placed in this encounter.   The patient has a good understanding of the overall plan. she agrees with it. she will call with any problems that may develop before the next visit here.   Rulon Eisenmenger, MD 07/14/2015

## 2015-07-14 NOTE — Addendum Note (Signed)
Addended by: Prentiss Bells on: 07/14/2015 01:39 PM   Modules accepted: Medications

## 2015-07-14 NOTE — Assessment & Plan Note (Signed)
Right lumpectomy 07/07/2015: Invasive ductal carcinoma grade 1, 1 cm span, with low-grade DCIS, DCIS focally 0.1 cm to inferior margin, 0/3 lymph nodes negative, ER 95%, PR 5%, HER-2 negative ratio 1.1, Ki-67 10%, T1 cN0 stage IA ( [facility] history of triple negative left breast cancer treated with lumpectomy, adjuvant chemotherapy, radiation , in [place], unknown stage 04/16/2000)  Pathology review: I discussed the final pathology report and provided a copy to the patient. I discussed the significance of margins especially the inferior margin which was close for DCIS. I explained the difference between a close margin and a positive margin. We will be discussing her case in the tumor board regarding margin issue.  Recommendations:  1. Oncotype DX testing to determine if chemotherapy would be of any benefit followed by 2. Adjuvant radiation therapy followed by 3. Adjuvant antiestrogen therapy   Return to clinic based upon Oncotype DX score result.

## 2015-07-21 ENCOUNTER — Encounter: Payer: Self-pay | Admitting: *Deleted

## 2015-07-21 NOTE — Progress Notes (Signed)
Received order per Dr. [name] for oncotype testing. Requisition sent to pathology.

## 2015-07-25 ENCOUNTER — Encounter: Payer: Self-pay | Admitting: Hematology and Oncology

## 2015-07-25 NOTE — Progress Notes (Signed)
I placed fmla form on desk of dr. [name] for [place]. Nothing has been scheduled as of yet. Noted in sharepoint left for dr. Serena Croissant

## 2015-07-27 ENCOUNTER — Encounter: Payer: Self-pay | Admitting: Hematology and Oncology

## 2015-07-27 ENCOUNTER — Telehealth: Payer: Self-pay | Admitting: *Deleted

## 2015-07-27 NOTE — Telephone Encounter (Signed)
Call from [place] reporting specimen not yet received for Dr. Geralyn Flash order for this patient.  Please call [phone] ext [phone]."

## 2015-07-27 NOTE — Progress Notes (Signed)
I faxed [place] fmla forms for Tracy [name] [phone] and left copy upfront for her records. I called and left mess for Tracy [name] forms were faxed and her copy ready for pickup. Sharepoint noted and filed

## 2015-08-01 ENCOUNTER — Encounter: Payer: Self-pay | Admitting: Radiation Oncology

## 2015-08-01 ENCOUNTER — Telehealth: Payer: Self-pay | Admitting: *Deleted

## 2015-08-01 NOTE — Telephone Encounter (Signed)
Received notification that there was insufficient tissue. Per Dr. [name] - no chemo recommended. Pt to proceed to xrt. Informed pt and confirmed appt with Dr. Pablo Ledger on 1/4. Denies furthers needs at this time.

## 2015-08-01 NOTE — Progress Notes (Signed)
Location of Breast Cancer: Right Breast  Histology per Pathology Report: Right Breast 07/07/15 Diagnosis 1. Lymph node, sentinel, biopsy, right axillary #1 - THERE IS NO EVIDENCE OF CARCINOMA IN 1 OF 1 LYMPH NODE (0/1). 2. Lymph node, sentinel, biopsy, right axillary #2 - THERE IS NO EVIDENCE OF CARCINOMA IN 1 OF 1 LYMPH NODE (0/1). 3. Lymph node, sentinel, biopsy, right axillary #3 - THERE IS NO EVIDENCE OF CARCINOMA IN 1 OF 1 LYMPH NODE (0/1). 4. Breast, lumpectomy, right 1 of 4 Amended copy Amended FINAL for Tracy Tracy [name], Tracy Tracy [name] [phone]) Diagnosis(continued) - INVASIVE DUCTAL CARCINOMA, GRADE 1/3, SPANNING 1.5 CM. - DUCTAL CARCINOMA IN SITU, LOW GRADE. - DUCTAL CARICNOMA IN SITU IS FOCALLY 0.1 CM TO THE SUPERIOR MARGIN AND INFERIOR MARGIN OF SPECIMEN #4. - SEE ONCOLOGY TABLE BELOW. 5. Breast, excision, right additional superior margin - FIBROCYSTIC CHANGES. - THERE IS NO EVIDENCE OF MALIGNANCY. - SEE COMMENT. 6. Breast, excision, right additional medial margin - FIBROCYSTIC CHANGES. - THERE IS NO EVIDENCE OF MALIGNANCY  06/13/15 Diagnosis 1. Breast, right, needle core biopsy, 9:00 o'clock - INVASIVE DUCTAL CARCINOMA, SEE COMMENT. 2. Lymph node, needle/core biopsy, right axilla - ONE LYMPH NODE, NEGATIVE FOR TUMOR (0/1).  Receptor Status: ER(95%), PR (5%), Her2-neu (neg), Ki-67(10%)  Tracy Tracy [name] had annual routine mammogram on 06/07/2015 which most recently revealed Tracy [name] 6 mm abnormality in the right breast. This was subsequently biopsied and proven to be invasive ductal carcinoma.  Past/Anticipated interventions by surgeon, if any: Dr. Autumn Messing, II - Lumpectomy  Past/Anticipated interventions by medical oncology, if any: Chemotherapy  2001 for cancer of the Left Breast   SAFETY ISSUES:  Prior radiation? Left breast cancer in 2001 that was treated in [place] with lumpectomy followed by chemotherapy and adjuvant radiation.   Pacemaker/ICD? No  Possible current  pregnancy?No  Is the patient on methotrexate? No  Current Complaints / other details:  Here today to discuss radiation plan for her right breast.  family history significant for breast cancer in Tracy Tracy [name] who was genetically negative but had Tracy [name] triple negative cancer. Breast cancer cousin  Having tenderness to right breast surgical site surgery 07-07-15, no actual pain.  BP 177/95 mmHg  Pulse 82  Temp(Src) 98.1 F (36.7 C) (Oral)  Ht _0  (1.549 m)  Wt 196 lb 11.2 oz (89.223 kg)  BMI 37.19 kg/m2  SpO2 100%  Wt Readings from Last 3 Encounters:  08/02/15 196 lb 11.2 oz (89.223 kg)  07/14/15 195 lb 11.2 oz (88.769 kg)  07/07/15 194 lb (87.998 kg)      Tracy Tracy [name] 08/01/2015,6:01 PM

## 2015-08-02 ENCOUNTER — Encounter: Payer: Self-pay | Admitting: Radiation Oncology

## 2015-08-02 ENCOUNTER — Ambulatory Visit
Admission: RE | Admit: 2015-08-02 | Discharge: 2015-08-02 | Disposition: A | Discharge status: Home / Self Care | Payer: Medicare Other | Source: Ambulatory Visit | Attending: Radiation Oncology | Admitting: Radiation Oncology

## 2015-08-02 VITALS — BP 177/95 | HR 82 | Temp 98.1°F | Ht 61.0 in | Wt 196.7 lb

## 2015-08-02 DIAGNOSIS — Z510 Encounter for antineoplastic radiation therapy: Secondary | ICD-10-CM | POA: Diagnosis not present

## 2015-08-02 DIAGNOSIS — Z9889 Other specified postprocedural states: Secondary | ICD-10-CM | POA: Diagnosis not present

## 2015-08-02 DIAGNOSIS — C50411 Malignant neoplasm of upper-outer quadrant of right female breast: Secondary | ICD-10-CM

## 2015-08-02 DIAGNOSIS — Z9221 Personal history of antineoplastic chemotherapy: Secondary | ICD-10-CM | POA: Insufficient documentation

## 2015-08-02 DIAGNOSIS — Z923 Personal history of irradiation: Secondary | ICD-10-CM | POA: Diagnosis not present

## 2015-08-02 DIAGNOSIS — Z170 Estrogen receptor positive status [ER+]: Secondary | ICD-10-CM | POA: Insufficient documentation

## 2015-08-02 HISTORY — DX: Malignant neoplasm of unspecified site of unspecified female breast: C50.919

## 2015-08-02 NOTE — Progress Notes (Signed)
Radiation Oncology         [phone]) [phone] ________________________________  Initial outpatient Consultation - Date: 08/02/2015   Name: Tracy [name] MRN: [id number]   DOB: Jun 29, 1946  REFERRING PHYSICIAN: Autumn Messing III, MD  DIAGNOSIS AND STAGE: Breast cancer of upper-outer quadrant of right female breast [facility])   Staging form: Breast, AJCC 7th Edition     Pathologic stage from 07/10/2015: Stage IA (T1c, N0, cM0) - Unsigned       Staging comments: Staged on surgical specimen by Dr. Lyndon Code     Clinical: No stage assigned - Unsigned  HISTORY OF PRESENT ILLNESS::Tracy [name] is a 70 y.o. female. She moved to [facility] 9 years ago and had routine mammograms. Her most recent mammogram in November revealed a 6 mm abnormality in the right breast. Biopsy on 06/13/15 showed IDC which was ER positive at 95% PR positive at 5%, Ki-67 at 10% and HER2 negative. A lymph node was also biopsied and negative. She elected for breast conservation and had a lumpectomy on 07/07/15 which showed a 1.5 cm IDC with focally close margins superior and inferiorly at 1 mm. 3 sentinal lymph nodes are negative. She met with Dr.Gudena and Oncotype testing was performed . There was insufficient tissue for testing so chemotherapy was not recommended. She also underwent genetic testing and those results are still pending. Adjuvant anti-estrogen therapy has been recommended. No calcifications were seen on her most recent mammogram so a pre-op mammogram has not been ordered.   PREVIOUS RADIATION THERAPY: Yes, triple negative left breast cancer in 2001 treated in [place] with lumpectomy, chemotherapy and radiation.    Past medical, social and family history were reviewed in the electronic chart. Review of symptoms was reviewed in the electronic chart. Medications were reviewed in the electronic chart.   PHYSICAL EXAM:  Filed Vitals:   08/02/15 0943  BP: 177/95  Pulse: 82  Temp: 98.1 F (36.7 C)  .196 lb 11.2 oz (89.223 kg).    IMPRESSION: T1cN0 Invasive Ductal Carcinoma of the Right Breasts  PLAN:I spoke to the patient today regarding her diagnosis and options for treatment. We discussed the equivalence in terms of survival and local failure between mastectomy and breast conservation. We discussed the role of radiation in decreasing local failures in patients who undergo lumpectomy. We discussed the low rates of failure in patients her age with these non aggressive breast cancers. We discussed the randomized trials in elderly women showing equivalent survival when omitting radiaton and taking an AI instead. We discussed the randomized trials showing a slight improvement in local control with the combination.We discussed the differences between systemic and local treatment. She would like to proceed with radiation which I think is especially prudent for her given her good health and her close margin.   We discussed the process of simulation and the placement tattoos.  We discussed the possibility of asymptomatic lung damage. We discussed the low likelihood of secondary malignancies. We discussed the possible side effects including but not limited to skin redness, fatigue, permanent skin darkening, and breast swelling.   Simulation scheduled on January 17th at 3 pm. Informed consent was signed.   I spent 40 minutes  face to face with the patient and more than 50% of that time was spent in counseling and/or coordination of care.   ------------------------------------------------  [name], MD  This document serves as a record of services personally performed by [name], MD. It was created on her behalf by Derek Mound, a  trained medical scribe. The creation of this record is based on the scribe's personal observations and the provider's statements to them. This document has been checked and approved by the attending provider.

## 2015-08-02 NOTE — Addendum Note (Signed)
Encounter addended by: Malena Edman, RN on: 08/02/2015  4:26 PM<BR>     Documentation filed: Charges VN

## 2015-08-04 ENCOUNTER — Telehealth: Payer: Self-pay | Admitting: Genetic Counselor

## 2015-08-07 NOTE — Telephone Encounter (Signed)
Discussed Tracy [name]'s out of pocket cost and that she may be able to get reduced cost per [facility]'s financial assistance program.  She called [facility] and discussed with them and her OOP cost was reduced to $100, so testing is moving forward per her permission now.

## 2015-08-07 NOTE — Telephone Encounter (Signed)
Reason for Call     Oncotype - Insufficient Tissue         Call Documentation      Mauro Kaufmann, RN at 08/01/2015 1:34 PM     Status: Signed       Expand All Collapse All   Received notification that there was insufficient tissue. Per Dr. [name] - no chemo recommended. Pt to proceed to xrt. Informed pt and confirmed appt with Dr. Pablo Ledger on 1/4. Denies furthers needs at this time.

## 2015-08-08 ENCOUNTER — Encounter: Payer: Self-pay | Admitting: *Deleted

## 2015-08-08 NOTE — Progress Notes (Signed)
[place] Psychosocial Distress Screening Clinical Social Work  Clinical Social Work was referred by distress screening protocol.  The patient scored a 5 on the Psychosocial Distress Thermometer which indicates moderate distress. Clinical Social Worker contacted patient at home to assess for distress and other psychosocial needs.  Patient stated she was dong well and had no concerns at this time.  CSW informed patient of the support team and support services at [facility], and encouraged patient to call with any questions or concerns.       Clinical Social Worker follow up needed: No.   ONCBCN DISTRESS SCREENING 08/02/2015  Screening Type Initial Screening  Distress experienced in past week (1-10) 5  Practical problem type Housing  Emotional problem type Nervousness/Anxiety;Boredom  Physical Problem type Pain;Sleep/insomnia;Constipation/diarrhea;Tingling hands/feet;Skin dry/itchy   Johnnye Lana, MSW, LCSW, OSW-C Clinical Social Worker [facility] [phone]

## 2015-08-15 ENCOUNTER — Ambulatory Visit
Admission: RE | Admit: 2015-08-15 | Discharge: 2015-08-15 | Disposition: A | Discharge status: Home / Self Care | Payer: Medicare Other | Source: Ambulatory Visit | Attending: Radiation Oncology | Admitting: Radiation Oncology

## 2015-08-15 DIAGNOSIS — Z9889 Other specified postprocedural states: Secondary | ICD-10-CM | POA: Diagnosis not present

## 2015-08-15 DIAGNOSIS — Z9221 Personal history of antineoplastic chemotherapy: Secondary | ICD-10-CM | POA: Diagnosis not present

## 2015-08-15 DIAGNOSIS — C50411 Malignant neoplasm of upper-outer quadrant of right female breast: Secondary | ICD-10-CM | POA: Diagnosis not present

## 2015-08-15 DIAGNOSIS — Z923 Personal history of irradiation: Secondary | ICD-10-CM | POA: Diagnosis not present

## 2015-08-15 DIAGNOSIS — Z170 Estrogen receptor positive status [ER+]: Secondary | ICD-10-CM | POA: Diagnosis not present

## 2015-08-15 DIAGNOSIS — Z510 Encounter for antineoplastic radiation therapy: Secondary | ICD-10-CM | POA: Diagnosis not present

## 2015-08-15 NOTE — Progress Notes (Signed)
Name: Tracy [name]   MRN: [id number]  Date:  08/15/2015  DOB: 04/20/1946  Status:outpatient    DIAGNOSIS: Breast cancer.  CONSENT VERIFIED: yes   SET UP: Patient is setup supine   IMMOBILIZATION:  The following immobilization was used:Custom Moldable Pillow, breast board.   NARRATIVE: [name] was brought to the [facility].  Identity was confirmed.  All relevant records and images related to the planned course of therapy were reviewed.  Then, the patient was positioned in a stable reproducible clinical set-up for radiation therapy.  Wires were placed to delineate the clinical extent of breast tissue. A wire was placed on the scar as well.  BBs were placed on the tattoo. CT images were obtained.  An isocenter was placed. Skin markings were placed.  The CT images were loaded into the planning software where the target and avoidance structures were contoured.  The radiation prescription was entered and confirmed. The patient was discharged in stable condition and tolerated simulation well.    TREATMENT PLANNING NOTE:  Treatment planning then occurred. I have requested : MLC's, isodose plan, basic dose calculation  I personally designed and supervised the construction of 3 medically necessary complex treatment devices for the protection of critical normal structures including the lungs and contralateral breast as well as the immobilization device which is necessary for set up certainty.   3D simulation occurred. I requested and analyzed a dose volume histogram of the heart, lungs and lumpectomy cavity.

## 2015-08-15 NOTE — Progress Notes (Signed)
Radiation Oncology         [phone] ________________________________  Name: Tracy [name]      MRN: [id number]          Date: 08/15/2015              DOB: 04-07-1946  Optical Surface Tracking Plan:  Since intensity modulated radiotherapy (IMRT) and 3D conformal radiation treatment methods are predicated on accurate and precise positioning for treatment, intrafraction motion monitoring is medically necessary to ensure accurate and safe treatment delivery.  The ability to quantify intrafraction motion without excessive ionizing radiation dose can only be performed with optical surface tracking. Accordingly, surface imaging offers the opportunity to obtain 3D measurements of patient position throughout IMRT and 3D treatments without excessive radiation exposure.  I am ordering optical surface tracking for this patient's upcoming course of radiotherapy. ________________________________ Signature   Reference:   Ursula Alert, J, et al. Surface imaging-based analysis of intrafraction motion for breast radiotherapy patients.Journal of [place], n. 6, nov. 2014. ISSN [id number].   Available at: <http://www.jacmp.org/index.php/jacmp/article/view/4957>.

## 2015-08-16 ENCOUNTER — Encounter (HOSPITAL_COMMUNITY): Payer: Self-pay

## 2015-08-16 ENCOUNTER — Other Ambulatory Visit: Payer: Self-pay | Admitting: *Deleted

## 2015-08-17 ENCOUNTER — Telehealth: Payer: Self-pay | Admitting: Hematology and Oncology

## 2015-08-17 DIAGNOSIS — Z9889 Other specified postprocedural states: Secondary | ICD-10-CM | POA: Diagnosis not present

## 2015-08-17 DIAGNOSIS — C50411 Malignant neoplasm of upper-outer quadrant of right female breast: Secondary | ICD-10-CM | POA: Diagnosis not present

## 2015-08-17 DIAGNOSIS — Z9221 Personal history of antineoplastic chemotherapy: Secondary | ICD-10-CM | POA: Diagnosis not present

## 2015-08-17 DIAGNOSIS — Z170 Estrogen receptor positive status [ER+]: Secondary | ICD-10-CM | POA: Diagnosis not present

## 2015-08-17 DIAGNOSIS — Z510 Encounter for antineoplastic radiation therapy: Secondary | ICD-10-CM | POA: Diagnosis not present

## 2015-08-17 DIAGNOSIS — Z923 Personal history of irradiation: Secondary | ICD-10-CM | POA: Diagnosis not present

## 2015-08-17 NOTE — Telephone Encounter (Signed)
Spoke to patient and she is aware of her follow up

## 2015-08-18 ENCOUNTER — Telehealth: Payer: Self-pay | Admitting: Genetic Counselor

## 2015-08-18 DIAGNOSIS — C50411 Malignant neoplasm of upper-outer quadrant of right female breast: Secondary | ICD-10-CM | POA: Diagnosis not present

## 2015-08-18 DIAGNOSIS — Z923 Personal history of irradiation: Secondary | ICD-10-CM | POA: Diagnosis not present

## 2015-08-18 DIAGNOSIS — Z170 Estrogen receptor positive status [ER+]: Secondary | ICD-10-CM | POA: Diagnosis not present

## 2015-08-18 DIAGNOSIS — Z9889 Other specified postprocedural states: Secondary | ICD-10-CM | POA: Diagnosis not present

## 2015-08-18 DIAGNOSIS — Z510 Encounter for antineoplastic radiation therapy: Secondary | ICD-10-CM | POA: Diagnosis not present

## 2015-08-18 DIAGNOSIS — Z9221 Personal history of antineoplastic chemotherapy: Secondary | ICD-10-CM | POA: Diagnosis not present

## 2015-08-18 NOTE — Telephone Encounter (Signed)
Discussed with Ms. Grabe that her genetic test results were negative for pathogenic mutations within any of 8 genes that would cause her to be at a high/moderate increased risk for breast cancer or other related cancers.  Additionally, no uncertain changes were found.  Discussed that this is most likely a reassuring result for [place], especially since many family members have lived to later ages in life and have not had cancer.  Discussed that Ms. Wenker should continue to follow her doctors' recommendations for future cancer screening.  Women in the family are still considered to be at a somewhat increased risk for breast cancer and so should begin annual mammogram screening at the age of 97 or 5 years younger than the earliest diagnosis in their immediate family, if they have not begun mammogram screening yet.  Ms. Cucuzza reports that her daughters and most other of the women in her family are already getting their mammograms regularly.  Discussed that Ms. Kassebaum's niece could have genetic counseling and testing, if she has not already done so.  Ms. Bola reports that this niece lives in [place], but that she would reach out to her about this information.  Encouraged Ms. Blass to call and update the family history with [place], if any one is diagnosed with cancer in the future.  She is welcome to call or email with any questions.

## 2015-08-21 ENCOUNTER — Ambulatory Visit: Payer: Self-pay | Admitting: Genetic Counselor

## 2015-08-21 DIAGNOSIS — Z809 Family history of malignant neoplasm, unspecified: Secondary | ICD-10-CM

## 2015-08-21 DIAGNOSIS — Z803 Family history of malignant neoplasm of breast: Secondary | ICD-10-CM

## 2015-08-21 DIAGNOSIS — Z1379 Encounter for other screening for genetic and chromosomal anomalies: Secondary | ICD-10-CM

## 2015-08-21 DIAGNOSIS — C50411 Malignant neoplasm of upper-outer quadrant of right female breast: Secondary | ICD-10-CM

## 2015-08-21 DIAGNOSIS — Z853 Personal history of malignant neoplasm of breast: Secondary | ICD-10-CM

## 2015-08-21 NOTE — Progress Notes (Signed)
GENETIC TEST RESULT  HPI: [name] was previously seen in the [place] clinic due to a personal history of metachronous, bilateral breast cancer, family history of breast and other cancers, and concerns regarding a hereditary predisposition to cancer. Please refer to our prior cancer genetics clinic note from July 13, 2015 for more information regarding [name]'s medical, social and family histories, and our assessment and recommendations, at the time. [name]'s recent genetic test results were disclosed to her, as were recommendations warranted by these results. These results and recommendations are discussed in more detail below.  GENETIC TEST RESULTS: At the time of [name]'s visit on 07/13/15, we recommended she pursue genetic testing of the 8-gene Breast High/Moderate Risk Panel Panel through [facility] Tracy Pigeon, MD).  The Breast High/Moderate Risk gene panel offered by [facility] includes sequencing and deletion/duplication analysis of the following 8 genes: ATM, BRCA1, BRCA2, CDH1, CHEK2, PALB2, PTEN, and TP53.  Those results are now back, the report date for which is August 16, 2015.  Genetic testing was normal, and did not reveal a deleterious mutation in these genes.  Additionally, no variants of uncertain significance (VUSes) were found.  The test report will be scanned into EPIC and will be located under the Results Review tab in the Pathology>Molecular Pathology section.   We discussed with [name] that since the current genetic testing is not perfect, it is possible there may be a gene mutation in one of these genes that current testing cannot detect, but that chance is small. We also discussed, that it is possible that another gene that has not yet been discovered, or that we have not yet tested, is responsible for the cancer diagnoses in the family, and it is, therefore, important to remain in touch with cancer genetics in the future so that we can  continue to offer [name] the most up to date genetic testing.   CANCER SCREENING RECOMMENDATIONS: This result is reassuring and indicates that [name] likely does not have an increased risk for a future cancer due to a mutation in one of these genes. This normal test also suggests that [name]'s cancer was most likely not due to an inherited predisposition associated with one of these genes.  Additionally, several of [name]'s relatives have lived to later ages in life and have never been diagnosed with cancer.  Most cancers happen by chance and this negative test suggests that her cancer falls into this category.  We, therefore, recommended she continue to follow the cancer management and screening guidelines provided by her oncology and primary healthcare providers.   RECOMMENDATIONS FOR FAMILY MEMBERS: Women in this family might be at some increased risk of developing cancer, over the general population risk, simply due to the family history of cancer. We recommended women in this family have a yearly mammogram beginning at age 95, or 17 years younger than the earliest onset of cancer, an an annual clinical breast exam, and perform monthly breast self-exams.  [name]'s sisters and all of her daughters should currently be having annual mammogram screening and she reports that they are doing so.  Women in this family should also have a gynecological exam as recommended by their primary provider. All family members should have a colonoscopy by age 32.  Based on [name]'s family history, we recommended her niece, who was diagnosed with breast cancer below the age of 49, have genetic counseling and testing, if she has not already done so.  [name] will let us know if we can be of any assistance in coordinating genetic counseling and/or testing for this family member.  This niece lives in [place], but [name] will reach out to her about this information.  FOLLOW-UP: Lastly, we discussed with Ms.  Tracy [name] that cancer genetics is a rapidly advancing field and it is possible that new genetic tests will be appropriate for her and/or her family members in the future. We encouraged her to remain in contact with cancer genetics on an annual basis so we can update her personal and family histories and let her know of advances in cancer genetics that may benefit this family.   Our contact number was provided. [name]'s questions were answered to her satisfaction, and she knows she is welcome to call us at anytime with additional questions or concerns.   Jeanine Luz, MS Genetic Counselor [name] Phone: [phone]

## 2015-08-22 ENCOUNTER — Ambulatory Visit
Admission: RE | Admit: 2015-08-22 | Discharge: 2015-08-22 | Disposition: A | Discharge status: Home / Self Care | Payer: Medicare Other | Source: Ambulatory Visit | Attending: Radiation Oncology | Admitting: Radiation Oncology

## 2015-08-22 DIAGNOSIS — Z923 Personal history of irradiation: Secondary | ICD-10-CM | POA: Diagnosis not present

## 2015-08-22 DIAGNOSIS — C50411 Malignant neoplasm of upper-outer quadrant of right female breast: Secondary | ICD-10-CM | POA: Diagnosis not present

## 2015-08-22 DIAGNOSIS — Z170 Estrogen receptor positive status [ER+]: Secondary | ICD-10-CM | POA: Diagnosis not present

## 2015-08-22 DIAGNOSIS — Z510 Encounter for antineoplastic radiation therapy: Secondary | ICD-10-CM | POA: Diagnosis not present

## 2015-08-22 DIAGNOSIS — Z9889 Other specified postprocedural states: Secondary | ICD-10-CM | POA: Diagnosis not present

## 2015-08-22 DIAGNOSIS — Z9221 Personal history of antineoplastic chemotherapy: Secondary | ICD-10-CM | POA: Diagnosis not present

## 2015-08-23 ENCOUNTER — Ambulatory Visit
Admission: RE | Admit: 2015-08-23 | Discharge: 2015-08-23 | Disposition: A | Discharge status: Home / Self Care | Payer: Medicare Other | Source: Ambulatory Visit | Attending: Radiation Oncology | Admitting: Radiation Oncology

## 2015-08-23 DIAGNOSIS — Z510 Encounter for antineoplastic radiation therapy: Secondary | ICD-10-CM | POA: Diagnosis not present

## 2015-08-23 DIAGNOSIS — Z923 Personal history of irradiation: Secondary | ICD-10-CM | POA: Diagnosis not present

## 2015-08-23 DIAGNOSIS — Z9889 Other specified postprocedural states: Secondary | ICD-10-CM | POA: Diagnosis not present

## 2015-08-23 DIAGNOSIS — Z9221 Personal history of antineoplastic chemotherapy: Secondary | ICD-10-CM | POA: Diagnosis not present

## 2015-08-23 DIAGNOSIS — Z170 Estrogen receptor positive status [ER+]: Secondary | ICD-10-CM | POA: Diagnosis not present

## 2015-08-23 DIAGNOSIS — C50411 Malignant neoplasm of upper-outer quadrant of right female breast: Secondary | ICD-10-CM | POA: Diagnosis not present

## 2015-08-24 ENCOUNTER — Ambulatory Visit
Admission: RE | Admit: 2015-08-24 | Discharge: 2015-08-24 | Disposition: A | Discharge status: Home / Self Care | Payer: Medicare Other | Source: Ambulatory Visit | Attending: Radiation Oncology | Admitting: Radiation Oncology

## 2015-08-24 DIAGNOSIS — Z510 Encounter for antineoplastic radiation therapy: Secondary | ICD-10-CM | POA: Diagnosis not present

## 2015-08-24 DIAGNOSIS — Z9889 Other specified postprocedural states: Secondary | ICD-10-CM | POA: Diagnosis not present

## 2015-08-24 DIAGNOSIS — Z923 Personal history of irradiation: Secondary | ICD-10-CM | POA: Diagnosis not present

## 2015-08-24 DIAGNOSIS — Z9221 Personal history of antineoplastic chemotherapy: Secondary | ICD-10-CM | POA: Diagnosis not present

## 2015-08-24 DIAGNOSIS — C50411 Malignant neoplasm of upper-outer quadrant of right female breast: Secondary | ICD-10-CM | POA: Diagnosis not present

## 2015-08-24 DIAGNOSIS — Z170 Estrogen receptor positive status [ER+]: Secondary | ICD-10-CM | POA: Diagnosis not present

## 2015-08-25 ENCOUNTER — Ambulatory Visit
Admission: RE | Admit: 2015-08-25 | Discharge: 2015-08-25 | Disposition: A | Discharge status: Home / Self Care | Payer: Medicare Other | Source: Ambulatory Visit | Attending: Radiation Oncology | Admitting: Radiation Oncology

## 2015-08-25 DIAGNOSIS — Z923 Personal history of irradiation: Secondary | ICD-10-CM | POA: Diagnosis not present

## 2015-08-25 DIAGNOSIS — Z170 Estrogen receptor positive status [ER+]: Secondary | ICD-10-CM | POA: Diagnosis not present

## 2015-08-25 DIAGNOSIS — Z9221 Personal history of antineoplastic chemotherapy: Secondary | ICD-10-CM | POA: Diagnosis not present

## 2015-08-25 DIAGNOSIS — Z9889 Other specified postprocedural states: Secondary | ICD-10-CM | POA: Diagnosis not present

## 2015-08-25 DIAGNOSIS — C50411 Malignant neoplasm of upper-outer quadrant of right female breast: Secondary | ICD-10-CM | POA: Diagnosis not present

## 2015-08-25 DIAGNOSIS — Z510 Encounter for antineoplastic radiation therapy: Secondary | ICD-10-CM | POA: Diagnosis not present

## 2015-08-28 ENCOUNTER — Ambulatory Visit
Admission: RE | Admit: 2015-08-28 | Discharge: 2015-08-28 | Disposition: A | Discharge status: Home / Self Care | Payer: Medicare Other | Source: Ambulatory Visit | Attending: Radiation Oncology | Admitting: Radiation Oncology

## 2015-08-28 DIAGNOSIS — Z170 Estrogen receptor positive status [ER+]: Secondary | ICD-10-CM | POA: Diagnosis not present

## 2015-08-28 DIAGNOSIS — Z9221 Personal history of antineoplastic chemotherapy: Secondary | ICD-10-CM | POA: Diagnosis not present

## 2015-08-28 DIAGNOSIS — Z923 Personal history of irradiation: Secondary | ICD-10-CM | POA: Diagnosis not present

## 2015-08-28 DIAGNOSIS — C50411 Malignant neoplasm of upper-outer quadrant of right female breast: Secondary | ICD-10-CM | POA: Diagnosis not present

## 2015-08-28 DIAGNOSIS — Z9889 Other specified postprocedural states: Secondary | ICD-10-CM | POA: Diagnosis not present

## 2015-08-28 DIAGNOSIS — Z510 Encounter for antineoplastic radiation therapy: Secondary | ICD-10-CM | POA: Diagnosis not present

## 2015-08-29 ENCOUNTER — Ambulatory Visit
Admission: RE | Admit: 2015-08-29 | Discharge: 2015-08-29 | Disposition: A | Discharge status: Home / Self Care | Payer: Medicare Other | Source: Ambulatory Visit | Attending: Radiation Oncology | Admitting: Radiation Oncology

## 2015-08-29 DIAGNOSIS — C50411 Malignant neoplasm of upper-outer quadrant of right female breast: Secondary | ICD-10-CM | POA: Diagnosis not present

## 2015-08-29 DIAGNOSIS — Z923 Personal history of irradiation: Secondary | ICD-10-CM | POA: Diagnosis not present

## 2015-08-29 DIAGNOSIS — Z9221 Personal history of antineoplastic chemotherapy: Secondary | ICD-10-CM | POA: Diagnosis not present

## 2015-08-29 DIAGNOSIS — Z510 Encounter for antineoplastic radiation therapy: Secondary | ICD-10-CM | POA: Diagnosis not present

## 2015-08-29 DIAGNOSIS — Z9889 Other specified postprocedural states: Secondary | ICD-10-CM | POA: Diagnosis not present

## 2015-08-29 DIAGNOSIS — Z170 Estrogen receptor positive status [ER+]: Secondary | ICD-10-CM | POA: Diagnosis not present

## 2015-08-29 NOTE — Progress Notes (Signed)
Weekly Management Note Current Dose:  12.5 Gy  Projected Dose: 50 Gy   Narrative:  The patient presents for routine under treatment assessment.  CBCT/MVCT images/Port film x-rays were reviewed.  The chart was checked. Doing well. Concerned that breast feels "hot"   Physical Findings: Weight:  . Unchanged. Skin. No evidence of infection.   Impression:  The patient is tolerating radiation.  Plan:  Continue treatment as planned. RN education performed.

## 2015-08-29 NOTE — Addendum Note (Signed)
Encounter addended by: Malena Edman, RN on: 08/29/2015  4:57 PM<BR>     Documentation filed: Inpatient Patient Education

## 2015-08-30 ENCOUNTER — Telehealth: Payer: Self-pay | Admitting: *Deleted

## 2015-08-30 ENCOUNTER — Ambulatory Visit
Admission: RE | Admit: 2015-08-30 | Discharge: 2015-08-30 | Disposition: A | Discharge status: Home / Self Care | Payer: Medicare Other | Source: Ambulatory Visit | Attending: Radiation Oncology | Admitting: Radiation Oncology

## 2015-08-30 ENCOUNTER — Other Ambulatory Visit: Payer: Self-pay | Admitting: Family Medicine

## 2015-08-30 DIAGNOSIS — Z170 Estrogen receptor positive status [ER+]: Secondary | ICD-10-CM | POA: Diagnosis not present

## 2015-08-30 DIAGNOSIS — Z923 Personal history of irradiation: Secondary | ICD-10-CM | POA: Diagnosis not present

## 2015-08-30 DIAGNOSIS — Z510 Encounter for antineoplastic radiation therapy: Secondary | ICD-10-CM | POA: Diagnosis not present

## 2015-08-30 DIAGNOSIS — C50411 Malignant neoplasm of upper-outer quadrant of right female breast: Secondary | ICD-10-CM | POA: Diagnosis not present

## 2015-08-30 DIAGNOSIS — Z9889 Other specified postprocedural states: Secondary | ICD-10-CM | POA: Diagnosis not present

## 2015-08-30 DIAGNOSIS — Z9221 Personal history of antineoplastic chemotherapy: Secondary | ICD-10-CM | POA: Diagnosis not present

## 2015-08-30 NOTE — Telephone Encounter (Signed)
Medication filled to pharmacy as requested.   

## 2015-08-30 NOTE — Telephone Encounter (Signed)
Left message to follow up after start of radiation.   

## 2015-08-31 ENCOUNTER — Ambulatory Visit
Admission: RE | Admit: 2015-08-31 | Discharge: 2015-08-31 | Disposition: A | Discharge status: Home / Self Care | Payer: Medicare Other | Source: Ambulatory Visit | Attending: Radiation Oncology | Admitting: Radiation Oncology

## 2015-08-31 DIAGNOSIS — C50411 Malignant neoplasm of upper-outer quadrant of right female breast: Secondary | ICD-10-CM | POA: Diagnosis not present

## 2015-08-31 DIAGNOSIS — Z510 Encounter for antineoplastic radiation therapy: Secondary | ICD-10-CM | POA: Diagnosis not present

## 2015-08-31 DIAGNOSIS — Z9221 Personal history of antineoplastic chemotherapy: Secondary | ICD-10-CM | POA: Diagnosis not present

## 2015-08-31 DIAGNOSIS — Z9889 Other specified postprocedural states: Secondary | ICD-10-CM | POA: Diagnosis not present

## 2015-08-31 DIAGNOSIS — Z923 Personal history of irradiation: Secondary | ICD-10-CM | POA: Diagnosis not present

## 2015-08-31 DIAGNOSIS — Z170 Estrogen receptor positive status [ER+]: Secondary | ICD-10-CM | POA: Diagnosis not present

## 2015-09-01 ENCOUNTER — Ambulatory Visit
Admission: RE | Admit: 2015-09-01 | Discharge: 2015-09-01 | Disposition: A | Discharge status: Home / Self Care | Payer: Medicare Other | Source: Ambulatory Visit | Attending: Radiation Oncology | Admitting: Radiation Oncology

## 2015-09-01 ENCOUNTER — Ambulatory Visit: Payer: Medicare Other | Admitting: Family Medicine

## 2015-09-01 DIAGNOSIS — Z170 Estrogen receptor positive status [ER+]: Secondary | ICD-10-CM | POA: Diagnosis not present

## 2015-09-01 DIAGNOSIS — Z9889 Other specified postprocedural states: Secondary | ICD-10-CM | POA: Diagnosis not present

## 2015-09-01 DIAGNOSIS — C50411 Malignant neoplasm of upper-outer quadrant of right female breast: Secondary | ICD-10-CM | POA: Diagnosis not present

## 2015-09-01 DIAGNOSIS — Z510 Encounter for antineoplastic radiation therapy: Secondary | ICD-10-CM | POA: Diagnosis not present

## 2015-09-01 DIAGNOSIS — Z9221 Personal history of antineoplastic chemotherapy: Secondary | ICD-10-CM | POA: Diagnosis not present

## 2015-09-01 DIAGNOSIS — Z923 Personal history of irradiation: Secondary | ICD-10-CM | POA: Diagnosis not present

## 2015-09-04 ENCOUNTER — Ambulatory Visit
Admission: RE | Admit: 2015-09-04 | Discharge: 2015-09-04 | Disposition: A | Discharge status: Home / Self Care | Payer: Medicare Other | Source: Ambulatory Visit | Attending: Radiation Oncology | Admitting: Radiation Oncology

## 2015-09-04 DIAGNOSIS — Z170 Estrogen receptor positive status [ER+]: Secondary | ICD-10-CM | POA: Diagnosis not present

## 2015-09-04 DIAGNOSIS — C50411 Malignant neoplasm of upper-outer quadrant of right female breast: Secondary | ICD-10-CM | POA: Diagnosis not present

## 2015-09-04 DIAGNOSIS — Z923 Personal history of irradiation: Secondary | ICD-10-CM | POA: Diagnosis not present

## 2015-09-04 DIAGNOSIS — Z510 Encounter for antineoplastic radiation therapy: Secondary | ICD-10-CM | POA: Diagnosis not present

## 2015-09-04 DIAGNOSIS — Z9221 Personal history of antineoplastic chemotherapy: Secondary | ICD-10-CM | POA: Diagnosis not present

## 2015-09-04 DIAGNOSIS — Z9889 Other specified postprocedural states: Secondary | ICD-10-CM | POA: Diagnosis not present

## 2015-09-05 ENCOUNTER — Ambulatory Visit
Admission: RE | Admit: 2015-09-05 | Discharge: 2015-09-05 | Disposition: A | Discharge status: Home / Self Care | Payer: Medicare Other | Source: Ambulatory Visit | Attending: Radiation Oncology | Admitting: Radiation Oncology

## 2015-09-05 ENCOUNTER — Encounter: Payer: Self-pay | Admitting: Radiation Oncology

## 2015-09-05 VITALS — BP 148/77 | HR 83 | Temp 99.5°F | Resp 18 | Wt 200.7 lb

## 2015-09-05 DIAGNOSIS — Z170 Estrogen receptor positive status [ER+]: Secondary | ICD-10-CM | POA: Diagnosis not present

## 2015-09-05 DIAGNOSIS — C50411 Malignant neoplasm of upper-outer quadrant of right female breast: Secondary | ICD-10-CM

## 2015-09-05 DIAGNOSIS — Z923 Personal history of irradiation: Secondary | ICD-10-CM | POA: Diagnosis not present

## 2015-09-05 DIAGNOSIS — Z9889 Other specified postprocedural states: Secondary | ICD-10-CM | POA: Diagnosis not present

## 2015-09-05 DIAGNOSIS — Z510 Encounter for antineoplastic radiation therapy: Secondary | ICD-10-CM | POA: Diagnosis not present

## 2015-09-05 DIAGNOSIS — Z9221 Personal history of antineoplastic chemotherapy: Secondary | ICD-10-CM | POA: Diagnosis not present

## 2015-09-05 MED ORDER — RADIAPLEXRX EX GEL
Freq: Once | CUTANEOUS | Status: AC
  Administered 2015-09-05: 14:00:00 via TOPICAL

## 2015-09-05 NOTE — Progress Notes (Addendum)
Weekly rad txs right breast, 10/20 completed,  slight tanning under axilla,  skin intact, no skin changes,  Gave  radiaplex declined alra deodorant,, patient hasn't had use of these products, discussed to use after rad tx and bedtime on breast affected, discussed ways to manage skin irritation and fatigue, patient also given radiation therapy and you book, verbal understanding,,teach back given, apologized for not having pt education done ,sooner,  2:14 PM BP 148/77 mmHg  Pulse 83  Temp(Src) 99.5 F (37.5 C) (Oral)  Resp 18  Wt 200 lb 11.2 oz (91.037 kg)  Wt Readings from Last 3 Encounters:  09/05/15 200 lb 11.2 oz (91.037 kg)  08/02/15 196 lb 11.2 oz (89.223 kg)  07/14/15 195 lb 11.2 oz (88.769 kg)

## 2015-09-05 NOTE — Progress Notes (Signed)
Weekly Management Note Current Dose:  25 Gy  Projected Dose: 50 Gy   Narrative:  The patient presents for routine under treatment assessment.  CBCT/MVCT images/Port film x-rays were reviewed.  The chart was checked. Doing well. Breast is sore. Received radiaplex and RN teaching today.   Physical Findings: Weight: 200 lb 11.2 oz (91.037 kg). Unchanged skin  Impression:  The patient is tolerating radiation.  Plan:  Continue treatment as planned. RN education performed. Start radiaplex.

## 2015-09-06 ENCOUNTER — Ambulatory Visit
Admission: RE | Admit: 2015-09-06 | Discharge: 2015-09-06 | Disposition: A | Discharge status: Home / Self Care | Payer: Medicare Other | Source: Ambulatory Visit | Attending: Radiation Oncology | Admitting: Radiation Oncology

## 2015-09-06 DIAGNOSIS — Z9889 Other specified postprocedural states: Secondary | ICD-10-CM | POA: Diagnosis not present

## 2015-09-06 DIAGNOSIS — Z923 Personal history of irradiation: Secondary | ICD-10-CM | POA: Diagnosis not present

## 2015-09-06 DIAGNOSIS — C50411 Malignant neoplasm of upper-outer quadrant of right female breast: Secondary | ICD-10-CM | POA: Diagnosis not present

## 2015-09-06 DIAGNOSIS — Z510 Encounter for antineoplastic radiation therapy: Secondary | ICD-10-CM | POA: Diagnosis not present

## 2015-09-06 DIAGNOSIS — Z170 Estrogen receptor positive status [ER+]: Secondary | ICD-10-CM | POA: Diagnosis not present

## 2015-09-06 DIAGNOSIS — Z9221 Personal history of antineoplastic chemotherapy: Secondary | ICD-10-CM | POA: Diagnosis not present

## 2015-09-07 ENCOUNTER — Ambulatory Visit
Admission: RE | Admit: 2015-09-07 | Discharge: 2015-09-07 | Disposition: A | Discharge status: Home / Self Care | Payer: Medicare Other | Source: Ambulatory Visit | Attending: Radiation Oncology | Admitting: Radiation Oncology

## 2015-09-07 DIAGNOSIS — C50411 Malignant neoplasm of upper-outer quadrant of right female breast: Secondary | ICD-10-CM | POA: Diagnosis not present

## 2015-09-07 DIAGNOSIS — Z9221 Personal history of antineoplastic chemotherapy: Secondary | ICD-10-CM | POA: Diagnosis not present

## 2015-09-07 DIAGNOSIS — Z510 Encounter for antineoplastic radiation therapy: Secondary | ICD-10-CM | POA: Diagnosis not present

## 2015-09-07 DIAGNOSIS — Z170 Estrogen receptor positive status [ER+]: Secondary | ICD-10-CM | POA: Diagnosis not present

## 2015-09-07 DIAGNOSIS — Z9889 Other specified postprocedural states: Secondary | ICD-10-CM | POA: Diagnosis not present

## 2015-09-07 DIAGNOSIS — Z923 Personal history of irradiation: Secondary | ICD-10-CM | POA: Diagnosis not present

## 2015-09-08 ENCOUNTER — Other Ambulatory Visit: Payer: Self-pay | Admitting: Family

## 2015-09-08 ENCOUNTER — Telehealth: Payer: Self-pay | Admitting: *Deleted

## 2015-09-08 ENCOUNTER — Ambulatory Visit
Admission: RE | Admit: 2015-09-08 | Discharge: 2015-09-08 | Disposition: A | Discharge status: Home / Self Care | Payer: Medicare Other | Source: Ambulatory Visit | Attending: Radiation Oncology | Admitting: Radiation Oncology

## 2015-09-08 DIAGNOSIS — Z510 Encounter for antineoplastic radiation therapy: Secondary | ICD-10-CM | POA: Diagnosis not present

## 2015-09-08 DIAGNOSIS — Z170 Estrogen receptor positive status [ER+]: Secondary | ICD-10-CM | POA: Diagnosis not present

## 2015-09-08 DIAGNOSIS — Z9221 Personal history of antineoplastic chemotherapy: Secondary | ICD-10-CM | POA: Diagnosis not present

## 2015-09-08 DIAGNOSIS — Z9889 Other specified postprocedural states: Secondary | ICD-10-CM | POA: Diagnosis not present

## 2015-09-08 DIAGNOSIS — C50411 Malignant neoplasm of upper-outer quadrant of right female breast: Secondary | ICD-10-CM | POA: Diagnosis not present

## 2015-09-08 DIAGNOSIS — Z923 Personal history of irradiation: Secondary | ICD-10-CM | POA: Diagnosis not present

## 2015-09-08 MED ORDER — FLUTICASONE PROPIONATE 50 MCG/ACT NA SUSP: 2.0000 | Freq: Every day | NASAL | Status: DC

## 2015-09-08 NOTE — Telephone Encounter (Signed)
Ok for [facility]- 2 sprays each nostril daily.  Please send script w/ 11 refills to pharmacy

## 2015-09-08 NOTE — Telephone Encounter (Signed)
Received fax from [facility] that insurance no longer covers nasonex. Plan covers flonase. Please advise if this is appropriate alternative for pt and if so send new Rx?  Thanks!

## 2015-09-08 NOTE — Telephone Encounter (Signed)
Rx sent, left message for pt to return my call.

## 2015-09-11 ENCOUNTER — Ambulatory Visit
Admission: RE | Admit: 2015-09-11 | Discharge: 2015-09-11 | Disposition: A | Discharge status: Home / Self Care | Payer: Medicare Other | Source: Ambulatory Visit | Attending: Radiation Oncology | Admitting: Radiation Oncology

## 2015-09-11 DIAGNOSIS — Z510 Encounter for antineoplastic radiation therapy: Secondary | ICD-10-CM | POA: Diagnosis not present

## 2015-09-11 DIAGNOSIS — Z923 Personal history of irradiation: Secondary | ICD-10-CM | POA: Diagnosis not present

## 2015-09-11 DIAGNOSIS — Z9889 Other specified postprocedural states: Secondary | ICD-10-CM | POA: Diagnosis not present

## 2015-09-11 DIAGNOSIS — C50411 Malignant neoplasm of upper-outer quadrant of right female breast: Secondary | ICD-10-CM | POA: Diagnosis not present

## 2015-09-11 DIAGNOSIS — Z170 Estrogen receptor positive status [ER+]: Secondary | ICD-10-CM | POA: Diagnosis not present

## 2015-09-11 DIAGNOSIS — Z9221 Personal history of antineoplastic chemotherapy: Secondary | ICD-10-CM | POA: Diagnosis not present

## 2015-09-11 NOTE — Telephone Encounter (Signed)
Left message for pt to return my call and sent mychart message. 

## 2015-09-12 ENCOUNTER — Other Ambulatory Visit: Payer: Self-pay

## 2015-09-12 ENCOUNTER — Ambulatory Visit
Admission: RE | Admit: 2015-09-12 | Discharge: 2015-09-12 | Disposition: A | Discharge status: Home / Self Care | Payer: Medicare Other | Source: Ambulatory Visit | Attending: Radiation Oncology | Admitting: Radiation Oncology

## 2015-09-12 ENCOUNTER — Encounter (HOSPITAL_COMMUNITY): Payer: Self-pay | Admitting: Emergency Medicine

## 2015-09-12 DIAGNOSIS — C50919 Malignant neoplasm of unspecified site of unspecified female breast: Secondary | ICD-10-CM | POA: Insufficient documentation

## 2015-09-12 DIAGNOSIS — G2581 Restless legs syndrome: Secondary | ICD-10-CM | POA: Insufficient documentation

## 2015-09-12 DIAGNOSIS — Z87891 Personal history of nicotine dependence: Secondary | ICD-10-CM | POA: Insufficient documentation

## 2015-09-12 DIAGNOSIS — Z7982 Long term (current) use of aspirin: Secondary | ICD-10-CM

## 2015-09-12 DIAGNOSIS — R0781 Pleurodynia: Secondary | ICD-10-CM | POA: Insufficient documentation

## 2015-09-12 DIAGNOSIS — I213 ST elevation (STEMI) myocardial infarction of unspecified site: Secondary | ICD-10-CM | POA: Insufficient documentation

## 2015-09-12 DIAGNOSIS — Z853 Personal history of malignant neoplasm of breast: Secondary | ICD-10-CM | POA: Diagnosis not present

## 2015-09-12 DIAGNOSIS — I2510 Atherosclerotic heart disease of native coronary artery without angina pectoris: Secondary | ICD-10-CM

## 2015-09-12 DIAGNOSIS — E785 Hyperlipidemia, unspecified: Secondary | ICD-10-CM

## 2015-09-12 DIAGNOSIS — F419 Anxiety disorder, unspecified: Secondary | ICD-10-CM | POA: Insufficient documentation

## 2015-09-12 DIAGNOSIS — I10 Essential (primary) hypertension: Secondary | ICD-10-CM | POA: Insufficient documentation

## 2015-09-12 DIAGNOSIS — Z510 Encounter for antineoplastic radiation therapy: Secondary | ICD-10-CM | POA: Diagnosis not present

## 2015-09-12 DIAGNOSIS — Z7951 Long term (current) use of inhaled steroids: Secondary | ICD-10-CM | POA: Insufficient documentation

## 2015-09-12 DIAGNOSIS — C50411 Malignant neoplasm of upper-outer quadrant of right female breast: Secondary | ICD-10-CM | POA: Diagnosis not present

## 2015-09-12 DIAGNOSIS — I252 Old myocardial infarction: Secondary | ICD-10-CM

## 2015-09-12 DIAGNOSIS — R42 Dizziness and giddiness: Secondary | ICD-10-CM | POA: Insufficient documentation

## 2015-09-12 DIAGNOSIS — Z9221 Personal history of antineoplastic chemotherapy: Secondary | ICD-10-CM | POA: Diagnosis not present

## 2015-09-12 DIAGNOSIS — Z79899 Other long term (current) drug therapy: Secondary | ICD-10-CM

## 2015-09-12 DIAGNOSIS — Z170 Estrogen receptor positive status [ER+]: Secondary | ICD-10-CM | POA: Diagnosis not present

## 2015-09-12 DIAGNOSIS — E1169 Type 2 diabetes mellitus with other specified complication: Secondary | ICD-10-CM | POA: Diagnosis not present

## 2015-09-12 DIAGNOSIS — R0789 Other chest pain: Secondary | ICD-10-CM | POA: Diagnosis not present

## 2015-09-12 DIAGNOSIS — R079 Chest pain, unspecified: Secondary | ICD-10-CM | POA: Diagnosis not present

## 2015-09-12 DIAGNOSIS — Z91040 Latex allergy status: Secondary | ICD-10-CM | POA: Insufficient documentation

## 2015-09-12 DIAGNOSIS — Z923 Personal history of irradiation: Secondary | ICD-10-CM | POA: Diagnosis not present

## 2015-09-12 DIAGNOSIS — C799 Secondary malignant neoplasm of unspecified site: Secondary | ICD-10-CM | POA: Diagnosis not present

## 2015-09-12 DIAGNOSIS — J42 Unspecified chronic bronchitis: Secondary | ICD-10-CM | POA: Insufficient documentation

## 2015-09-12 DIAGNOSIS — Z880 Allergy status to penicillin: Secondary | ICD-10-CM | POA: Insufficient documentation

## 2015-09-12 DIAGNOSIS — M1990 Unspecified osteoarthritis, unspecified site: Secondary | ICD-10-CM | POA: Insufficient documentation

## 2015-09-12 DIAGNOSIS — Z8709 Personal history of other diseases of the respiratory system: Secondary | ICD-10-CM

## 2015-09-12 DIAGNOSIS — Z8673 Personal history of transient ischemic attack (TIA), and cerebral infarction without residual deficits: Secondary | ICD-10-CM | POA: Insufficient documentation

## 2015-09-12 DIAGNOSIS — Z9889 Other specified postprocedural states: Secondary | ICD-10-CM | POA: Diagnosis not present

## 2015-09-12 DIAGNOSIS — R0602 Shortness of breath: Secondary | ICD-10-CM | POA: Insufficient documentation

## 2015-09-12 LAB — CBC
HEMATOCRIT: 29.1 % — AB (ref 36.0–46.0)
HEMOGLOBIN: 8.9 g/dL — AB (ref 12.0–15.0)
MCH: 25.6 pg — ABNORMAL LOW (ref 26.0–34.0)
MCHC: 30.6 g/dL (ref 30.0–36.0)
MCV: 83.9 fL (ref 78.0–100.0)
Platelets: 325 10*3/uL (ref 150–400)
RBC: 3.47 MIL/uL — ABNORMAL LOW (ref 3.87–5.11)
RDW: 14.4 % (ref 11.5–15.5)
WBC: 7.1 10*3/uL (ref 4.0–10.5)

## 2015-09-12 LAB — I-STAT TROPONIN, ED: Troponin i, poc: 0.01 ng/mL (ref 0.00–0.08)

## 2015-09-12 NOTE — Progress Notes (Signed)
Weekly Management Note Current Dose: 37.5  Gy  Projected Dose: 50 Gy   Narrative:  The patient presents for routine under treatment assessment.  CBCT/MVCT images/Port film x-rays were reviewed.  The chart was checked. Doing well. Skin sore but pleased it is not darker. Sometimes sore but radiaplex helps.  Saw on tx machine for [facility].   Physical Findings: Slightly dark right breast. Worse in axilla.   Impression:  The patient is tolerating radiation.  Plan:  Continue treatment as planned. Continue radiaplex.

## 2015-09-12 NOTE — ED Notes (Signed)
Pt. reports left chest pain with SOB and nausea , pain increases with deep inspiration . Denies diaphoresis or fever.

## 2015-09-13 ENCOUNTER — Ambulatory Visit
Admit: 2015-09-13 | Discharge: 2015-09-13 | Disposition: A | Discharge status: Home / Self Care | Payer: Medicare Other | Attending: Radiation Oncology | Admitting: Radiation Oncology

## 2015-09-13 ENCOUNTER — Encounter (HOSPITAL_COMMUNITY): Payer: Self-pay | Admitting: Family Medicine

## 2015-09-13 ENCOUNTER — Ambulatory Visit: Payer: Medicare Other

## 2015-09-13 ENCOUNTER — Emergency Department (HOSPITAL_COMMUNITY)
Admission: EM | Admit: 2015-09-13 | Discharge: 2015-09-13 | Disposition: A | Discharge status: Home / Self Care | Payer: Medicare Other | Attending: Emergency Medicine | Admitting: Emergency Medicine

## 2015-09-13 ENCOUNTER — Observation Stay (HOSPITAL_COMMUNITY): Payer: Medicare Other

## 2015-09-13 ENCOUNTER — Observation Stay (HOSPITAL_BASED_OUTPATIENT_CLINIC_OR_DEPARTMENT_OTHER)
Admission: EM | Admit: 2015-09-13 | Discharge: 2015-09-14 | Disposition: A | Discharge status: Home / Self Care | Payer: Medicare Other | Source: Home / Self Care | Attending: Emergency Medicine | Admitting: Emergency Medicine

## 2015-09-13 ENCOUNTER — Emergency Department (HOSPITAL_COMMUNITY): Payer: Medicare Other

## 2015-09-13 DIAGNOSIS — Z853 Personal history of malignant neoplasm of breast: Secondary | ICD-10-CM | POA: Diagnosis present

## 2015-09-13 DIAGNOSIS — R079 Chest pain, unspecified: Secondary | ICD-10-CM | POA: Diagnosis present

## 2015-09-13 DIAGNOSIS — R0781 Pleurodynia: Secondary | ICD-10-CM

## 2015-09-13 DIAGNOSIS — C799 Secondary malignant neoplasm of unspecified site: Secondary | ICD-10-CM | POA: Diagnosis not present

## 2015-09-13 DIAGNOSIS — R0602 Shortness of breath: Secondary | ICD-10-CM | POA: Diagnosis not present

## 2015-09-13 DIAGNOSIS — R0789 Other chest pain: Secondary | ICD-10-CM | POA: Diagnosis not present

## 2015-09-13 DIAGNOSIS — E1169 Type 2 diabetes mellitus with other specified complication: Secondary | ICD-10-CM | POA: Diagnosis present

## 2015-09-13 DIAGNOSIS — I10 Essential (primary) hypertension: Secondary | ICD-10-CM | POA: Diagnosis not present

## 2015-09-13 DIAGNOSIS — E785 Hyperlipidemia, unspecified: Secondary | ICD-10-CM | POA: Diagnosis not present

## 2015-09-13 LAB — I-STAT TROPONIN, ED
TROPONIN I, POC: 0 ng/mL (ref 0.00–0.08)
TROPONIN I, POC: 0 ng/mL (ref 0.00–0.08)

## 2015-09-13 LAB — CREATININE, SERUM
Creatinine, Ser: 1.08 mg/dL — ABNORMAL HIGH (ref 0.44–1.00)
GFR calc non Af Amer: 50 mL/min — ABNORMAL LOW (ref >60)
GFR, EST AFRICAN AMERICAN: 58 mL/min — AB (ref >60)

## 2015-09-13 LAB — BASIC METABOLIC PANEL
Anion gap: 11 (ref 5–15)
BUN: 6 mg/dL (ref 6–20)
CALCIUM: 8.8 mg/dL — AB (ref 8.9–10.3)
CO2: 24 mmol/L (ref 22–32)
Chloride: 104 mmol/L (ref 101–111)
Creatinine, Ser: 0.93 mg/dL (ref 0.44–1.00)
GFR calc Af Amer: >60 mL/min (ref >60)
Glucose, Bld: 100 mg/dL — ABNORMAL HIGH (ref 65–99)
POTASSIUM: 3.7 mmol/L (ref 3.5–5.1)
SODIUM: 139 mmol/L (ref 135–145)

## 2015-09-13 LAB — CBC
HEMATOCRIT: 30.7 % — AB (ref 36.0–46.0)
Hemoglobin: 9.6 g/dL — ABNORMAL LOW (ref 12.0–15.0)
MCH: 26.2 pg (ref 26.0–34.0)
MCHC: 31.3 g/dL (ref 30.0–36.0)
MCV: 83.7 fL (ref 78.0–100.0)
Platelets: 333 10*3/uL (ref 150–400)
RBC: 3.67 MIL/uL — ABNORMAL LOW (ref 3.87–5.11)
RDW: 14.5 % (ref 11.5–15.5)
WBC: 8.3 10*3/uL (ref 4.0–10.5)

## 2015-09-13 LAB — CBG MONITORING, ED: GLUCOSE-CAPILLARY: 166 mg/dL — AB (ref 65–99)

## 2015-09-13 MED ORDER — ACETAMINOPHEN 650 MG RE SUPP: 650.0000 mg | Freq: Four times a day (QID) | RECTAL | Status: DC | PRN

## 2015-09-13 MED ORDER — IOHEXOL 350 MG/ML SOLN
100.0000 mL | Freq: Once | INTRAVENOUS | Status: AC | PRN
  Administered 2015-09-13: 100 mL via INTRAVENOUS

## 2015-09-13 MED ORDER — ATORVASTATIN CALCIUM 10 MG PO TABS
10.0000 mg | ORAL_TABLET | Freq: Every day | ORAL | Status: DC
  Administered 2015-09-13: 10 mg via ORAL
  Filled 2015-09-13 (×2): qty 1

## 2015-09-13 MED ORDER — TRAZODONE HCL 100 MG PO TABS
100.0000 mg | ORAL_TABLET | Freq: Every evening | ORAL | Status: DC | PRN
  Administered 2015-09-13: 100 mg via ORAL
  Filled 2015-09-13: qty 1

## 2015-09-13 MED ORDER — CALCIUM CARBONATE 1250 (500 CA) MG PO TABS
1250.0000 mg | ORAL_TABLET | Freq: Every day | ORAL | Status: DC
Start: 2015-09-14 — End: 2015-09-14
  Administered 2015-09-14: 1250 mg via ORAL
  Filled 2015-09-13: qty 1

## 2015-09-13 MED ORDER — DEXAMETHASONE SODIUM PHOSPHATE 10 MG/ML IJ SOLN: 10.0000 mg | Freq: Once | INTRAMUSCULAR | Status: DC

## 2015-09-13 MED ORDER — HYDROCODONE-ACETAMINOPHEN 5-325 MG PO TABS: 1.0000 | ORAL_TABLET | Freq: Four times a day (QID) | ORAL | Status: DC | PRN

## 2015-09-13 MED ORDER — ACETAMINOPHEN 325 MG PO TABS: 650.0000 mg | ORAL_TABLET | Freq: Four times a day (QID) | ORAL | Status: DC | PRN

## 2015-09-13 MED ORDER — KETOROLAC TROMETHAMINE 15 MG/ML IJ SOLN: 15.0000 mg | Freq: Four times a day (QID) | INTRAMUSCULAR | Status: DC | PRN

## 2015-09-13 MED ORDER — SODIUM CHLORIDE 0.9 % IV BOLUS (SEPSIS)
1000.0000 mL | Freq: Once | INTRAVENOUS | Status: AC
  Administered 2015-09-13: 1000 mL via INTRAVENOUS

## 2015-09-13 MED ORDER — ALBUTEROL SULFATE HFA 108 (90 BASE) MCG/ACT IN AERS: 2.0000 | INHALATION_SPRAY | RESPIRATORY_TRACT | Status: DC | PRN

## 2015-09-13 MED ORDER — ASPIRIN 81 MG PO CHEW
81.0000 mg | CHEWABLE_TABLET | Freq: Every day | ORAL | Status: DC
  Administered 2015-09-14: 81 mg via ORAL
  Filled 2015-09-13: qty 1

## 2015-09-13 MED ORDER — LORATADINE 10 MG PO TABS
10.0000 mg | ORAL_TABLET | Freq: Every day | ORAL | Status: DC
  Administered 2015-09-14: 10 mg via ORAL
  Filled 2015-09-13 (×2): qty 1

## 2015-09-13 MED ORDER — SODIUM CHLORIDE 0.9% FLUSH
3.0000 mL | Freq: Two times a day (BID) | INTRAVENOUS | Status: DC
Start: 2015-09-13 — End: 2015-09-14
  Administered 2015-09-14: 3 mL via INTRAVENOUS

## 2015-09-13 MED ORDER — ALBUTEROL SULFATE (2.5 MG/3ML) 0.083% IN NEBU: 2.5000 mg | INHALATION_SOLUTION | RESPIRATORY_TRACT | Status: DC | PRN

## 2015-09-13 MED ORDER — HEPARIN SODIUM (PORCINE) 5000 UNIT/ML IJ SOLN: 5000.0000 [IU] | Freq: Three times a day (TID) | INTRAMUSCULAR | Status: DC

## 2015-09-13 MED ORDER — METHYLPREDNISOLONE SODIUM SUCC 125 MG IJ SOLR
125.0000 mg | Freq: Once | INTRAMUSCULAR | Status: AC
  Administered 2015-09-13: 125 mg via INTRAVENOUS
  Filled 2015-09-13: qty 2

## 2015-09-13 MED ORDER — DIPHENHYDRAMINE HCL 50 MG/ML IJ SOLN
50.0000 mg | Freq: Once | INTRAMUSCULAR | Status: AC
  Administered 2015-09-13: 50 mg via INTRAVENOUS
  Filled 2015-09-13: qty 1

## 2015-09-13 MED ORDER — INSULIN ASPART 100 UNIT/ML ~~LOC~~ SOLN
0.0000 [IU] | Freq: Three times a day (TID) | SUBCUTANEOUS | Status: DC
  Administered 2015-09-13: 3 [IU] via SUBCUTANEOUS
  Administered 2015-09-14 (×2): 2 [IU] via SUBCUTANEOUS
  Filled 2015-09-13: qty 1

## 2015-09-13 MED ORDER — MORPHINE SULFATE (PF) 2 MG/ML IV SOLN: 2.0000 mg | INTRAVENOUS | Status: DC | PRN

## 2015-09-13 MED ORDER — HYDROCODONE-ACETAMINOPHEN 5-325 MG PO TABS
1.0000 | ORAL_TABLET | Freq: Once | ORAL | Status: AC
  Administered 2015-09-13: 1 via ORAL
  Filled 2015-09-13: qty 1

## 2015-09-13 MED ORDER — IRBESARTAN 150 MG PO TABS
150.0000 mg | ORAL_TABLET | Freq: Every day | ORAL | Status: DC
  Administered 2015-09-13 – 2015-09-14 (×2): 150 mg via ORAL
  Filled 2015-09-13 (×2): qty 1

## 2015-09-13 MED ORDER — ASPIRIN 81 MG PO CHEW
243.0000 mg | CHEWABLE_TABLET | Freq: Once | ORAL | Status: AC
  Administered 2015-09-13: 243 mg via ORAL
  Filled 2015-09-13: qty 3

## 2015-09-13 MED ORDER — OXYCODONE-ACETAMINOPHEN 5-325 MG PO TABS
ORAL_TABLET | ORAL | Status: AC
  Filled 2015-09-13: qty 1

## 2015-09-13 MED ORDER — DIPHENHYDRAMINE HCL 50 MG/ML IJ SOLN: 25.0000 mg | Freq: Once | INTRAMUSCULAR | Status: DC

## 2015-09-13 MED ORDER — OXYCODONE-ACETAMINOPHEN 5-325 MG PO TABS
1.0000 | ORAL_TABLET | Freq: Once | ORAL | Status: AC
  Administered 2015-09-13: 1 via ORAL

## 2015-09-13 MED ORDER — CYCLOBENZAPRINE HCL 10 MG PO TABS: 10.0000 mg | ORAL_TABLET | Freq: Three times a day (TID) | ORAL | Status: DC | PRN

## 2015-09-13 MED ORDER — PANTOPRAZOLE SODIUM 40 MG PO TBEC
40.0000 mg | DELAYED_RELEASE_TABLET | Freq: Every day | ORAL | Status: DC
  Administered 2015-09-13 – 2015-09-14 (×2): 40 mg via ORAL
  Filled 2015-09-13 (×2): qty 1

## 2015-09-13 MED ORDER — CYCLOBENZAPRINE HCL 10 MG PO TABS
5.0000 mg | ORAL_TABLET | Freq: Once | ORAL | Status: AC
  Administered 2015-09-13: 5 mg via ORAL
  Filled 2015-09-13: qty 1

## 2015-09-13 MED ORDER — TRAMADOL HCL 50 MG PO TABS: 50.0000 mg | ORAL_TABLET | Freq: Four times a day (QID) | ORAL | Status: DC | PRN

## 2015-09-13 NOTE — H&P (Signed)
[facility] History and Physical  Tracy [name] [id number] DOB: 1946/06/06 DOA: 09/13/2015  Referring physician: Emergency Department PCP: [name], MD  Specialists:   Chief Complaint: Chest Pain  HPI: Tracy [name] is a 70 y.o. female with a hx of breast cancer undergoing daily radiation, last dose 2/14, HTN, DM2, prior TIA, CAD who presented to the ED initially with chest pains with concerns for ? PE given cancer hx. Patient elected to leave ED AMA and returned with continued chest pains. Denies abd pain, diarrhea. CTA chest ordered, pending. EKG reportedly with "abnormal" findings. Since initial presentation, serial trop have been neg x 3. Hospitalist consulted for consideration for admission.  Review of Systems:  Review of Systems  Constitutional: Negative for fever, chills and diaphoresis.  HENT: Negative for ear discharge and ear pain.   Eyes: Negative for pain and discharge.  Respiratory: Negative for sputum production and wheezing.   Cardiovascular: Positive for chest pain. Negative for claudication.  Gastrointestinal: Negative for vomiting and abdominal pain.  Musculoskeletal: Negative for joint pain, falls and neck pain.  Neurological: Negative for seizures and loss of consciousness.  Psychiatric/Behavioral: Negative for hallucinations and memory loss.     Past Medical History  Diagnosis Date  . Arthritis   . Hypertension   . Chronic bronchitis ([place])   . Myocardial infarction ([place]) 2001  . Cancer ([place]) 2000    breast cancer  . Hyperlipidemia   . Stroke [facility]) 2004    TIA, no deficits  . Chronic bronchitis ([place])   . Restless leg   . Anxiety   . Breast cancer ([place]) 06/13/15   Past Surgical History  Procedure Laterality Date  . Abdominal hysterectomy  1985  . Small intestine surgery    . Tubal ligation    . Breast surgery  2001    lt breast lumpectomy  . Radioactive seed guided mastectomy with axillary sentinel lymph node biopsy Right  07/07/2015    Procedure: RIGHT RADIOACTIVE SEED GUIDED PARTIAL MASTECTOMY WITH AXILLARY SENTINEL LYMPH NODE BIOPSY;  Surgeon: Autumn Messing III, MD;  Location: [facility];  Service: General;  Laterality: Right;   Social History:  reports that she has quit smoking. Her smoking use included Cigarettes. She has a 20 pack-year smoking history. She has never used smokeless tobacco. She reports that she does not drink alcohol or use illicit drugs.  where does patient live--home, ALF, SNF? and with whom if at home?  Can patient participate in ADLs?  Allergies  Allergen Reactions  . Bacitracin-Neomycin-Polymyxin  [Neomycin-Bacitracin Zn-Polymyx] Swelling  . Nsaids Other (See Comments)    nosebleeds  . Tape Hives    Pt cannot tolerate bandaids, tape, or any other adhesives.   . Ambien [Zolpidem Tartrate]     Hives   . Amoxicillin     Rash   . Contrast Media [Iodinated Diagnostic Agents] Hives    Pt states hives with prior ct, was given benadryl to resolve  . Latex Swelling  . Prednisone     Family History  Problem Relation Age of Onset  . Diabetes Father   . Lung cancer Sister     dx. <50; former smoker  . Diabetes Brother   . Diabetes Paternal Aunt   . Stroke Maternal Grandmother   . Diabetes Paternal Grandmother   . Colon polyps Neg Hx   . Esophageal cancer Neg Hx   . Gallbladder disease Neg Hx   . Emphysema Mother 66    smoker  .  Diabetes Brother   . Brain cancer Brother 48    unknown tumor type  . Cancer Daughter 47    neck cancer  . Other Daughter     hysterectomy for unspecified reason  . Breast cancer Cousin   . Cancer Cousin     unspecified type  . Breast cancer Other     triple negative breast cancer in her 28s     Prior to Admission medications   Medication Sig Start Date End Date Taking? Authorizing Provider  albuterol (PROAIR HFA) 108 (90 BASE) MCG/ACT inhaler Inhale 2 puffs into the lungs every 4 (four) hours as needed for wheezing. 12/06/13  Yes  [name], MD  aspirin 81 MG tablet Take 81 mg by mouth daily.   Yes Historical Provider, MD  atorvastatin (LIPITOR) 10 MG tablet TAKE 1 TABLET BY MOUTH DAILY 02/28/15  Yes [name], MD  calcium carbonate (OS-CAL) 600 MG TABS tablet Take 1,200 mg by mouth daily with breakfast.    Yes Historical Provider, MD  cyclobenzaprine (FLEXERIL) 10 MG tablet Take 1 tablet (10 mg total) by mouth 3 (three) times daily as needed for muscle spasms. 11/11/13  Yes [name], MD  fluticasone (FLONASE) 50 MCG/ACT nasal spray PLACE 2 SPRAYS INTO BOTH NOSTRILS DAILY. 09/11/15  Yes [name], MD  HYDROcodone-acetaminophen (NORCO/VICODIN) 5-325 MG per tablet Take 1 tablet by mouth every 6 (six) hours as needed for moderate pain. 10/25/14  Yes Brunetta Jeans, PA-C  loratadine (CLARITIN) 10 MG tablet Take 10 mg by mouth daily.   Yes Historical Provider, MD  meclizine (ANTIVERT) 25 MG tablet Take 1 tablet (25 mg total) by mouth 3 (three) times daily as needed for dizziness. 05/15/15  Yes [name], MD  MELATONIN PO Take 1 tablet by mouth at bedtime.   Yes Historical Provider, MD  mometasone (NASONEX) 50 MCG/ACT nasal spray Place 2 sprays into the nose daily. 02/27/15  Yes Debbrah Alar, NP  omeprazole (PRILOSEC) 40 MG capsule TAKE 1 CAPSULE BY MOUTH TWICE DAILY 08/30/15  Yes [name], MD  traMADol (ULTRAM) 50 MG tablet Take 50 mg by mouth every 6 (six) hours as needed for moderate pain. For arthritis 02/07/15  Yes Historical Provider, MD  traZODone (DESYREL) 100 MG tablet TAKE 1 TABLET BY MOUTH EVERY NIGHT AT BEDTIME 03/20/15  Yes [name], MD  valsartan (DIOVAN) 160 MG tablet TAKE 1 TABLET BY MOUTH DAILY 03/06/15  Yes [name], MD  Vitamin D, Cholecalciferol, 400 UNITS CAPS Take 2 capsules by mouth daily.   Yes Historical Provider, MD  ondansetron (ZOFRAN) 4 MG tablet Take 1 tablet (4 mg total) by mouth every 8 (eight) hours as needed for nausea or  vomiting. Patient not taking: Reported on 09/13/2015 05/15/15   [name], MD  oxyCODONE-acetaminophen (ROXICET) 5-325 MG tablet Take 1-2 tablets by mouth every 4 (four) hours as needed. Patient not taking: Reported on 09/13/2015 07/07/15   Autumn Messing III, MD   Physical Exam: Filed Vitals:   09/13/15 1330 09/13/15 1400 09/13/15 1430 09/13/15 1500  BP: 113/54 139/54 151/68 126/45  Pulse: 76 73 74 77  Temp:      TempSrc:      Resp: 17 21 21 20   SpO2: 99% 100% 98% 95%     General:  Awake, in nad  Eyes: PERRL B  ENT: membranes moist, dentition fair  Neck: trachea midline, neck supple  Cardiovascular: regular, s1, s2  Respiratory:  normal resp effort, no wheezing  Abdomen: soft,nondistended  Skin: normal skin turgor, no abnormal skin lesions seen  Musculoskeletal: perfused, no clubbing  Psychiatric: mood/affect normal//no auditory/visual hallucinations  Neurologic: cn2-12 grossly intact, strength/sensation intact  Labs on Admission:  Basic Metabolic Panel:  Recent Labs Lab 09/12/15 2334  NA 139  K 3.7  CL 104  CO2 24  GLUCOSE 100*  BUN 6  CREATININE 0.93  CALCIUM 8.8*   Liver Function Tests: No results for input(s): AST, ALT, ALKPHOS, BILITOT, PROT, ALBUMIN in the last 168 hours. No results for input(s): LIPASE, AMYLASE in the last 168 hours. No results for input(s): AMMONIA in the last 168 hours. CBC:  Recent Labs Lab 09/12/15 2334  WBC 7.1  HGB 8.9*  HCT 29.1*  MCV 83.9  PLT 325   Cardiac Enzymes: No results for input(s): CKTOTAL, CKMB, CKMBINDEX, TROPONINI in the last 168 hours.  BNP (last 3 results) No results for input(s): BNP in the last 8760 hours.  ProBNP (last 3 results) No results for input(s): PROBNP in the last 8760 hours.  CBG: No results for input(s): GLUCAP in the last 168 hours.  Radiological Exams on Admission: Dg Chest 2 View  09/13/2015  CLINICAL DATA:  Left chest pain since 5 p.m. History of heart attack 20 years  ago. EXAM: CHEST  2 VIEW COMPARISON:  09/06/2014 FINDINGS: Normal heart size and pulmonary vascularity. No focal airspace disease or consolidation in the lungs. No blunting of costophrenic angles. No pneumothorax. Mediastinal contours appear intact. Calcification of the aorta. Surgical clips in the right axilla. Degenerative changes in the spine and shoulders. Contour deformity of the left breast consistent with postoperative change. IMPRESSION: No active cardiopulmonary disease. Electronically Signed   By: Lucienne Capers M.D.   On: 09/13/2015 01:18    Assessment/Plan Active Problems:   HTN (hypertension)   Chest pain   Atypical chest pain   Hyperlipidemia associated with type 2 diabetes mellitus ([place])   Breast cancer of upper-outer quadrant of right female breast ([place])   1. Chest pain 1. Patient has ruled out with neg trop x 3 2. Will check 2d echo to r/o WMA 3. Would repeat EKG in AM 4. Cont analgesics as tolerated 5. Given daily radiation tx for breast cancer, likely chest wall pain from radiation 6. CT chest done, pending results 7. Admit to med-tele, obs 2. CAD 1. Trop neg x 3 2. EKG in AM 3. 2d echo pending 3. HTN 1. BP stable, controlled 4. DM2 1. Will continue on SSI coverage 5. Breast cancer 1. Followed by Rad Onc and receiving daily radiation tx 6. DVT prophylaxis 1. Heparin subQ  Code Status: Full Family Communication: Pt in room Disposition Plan: Admit to med-tele   CH[facility], Orpah Melter [facility] Pager [phone]  If 7PM-7AM, please contact night-coverage [email or web address] Password [email or web address] 09/13/2015, 3:10 PM

## 2015-09-13 NOTE — ED Notes (Signed)
Attempted to call report

## 2015-09-13 NOTE — ED Notes (Signed)
attempted to call report

## 2015-09-13 NOTE — ED Notes (Signed)
CT made aware that patient would be ready for scan at 1505. Verbalized understanding

## 2015-09-13 NOTE — ED Notes (Signed)
BP cuff respositioned for PT comfort. No further requests at this time.

## 2015-09-13 NOTE — ED Provider Notes (Signed)
TIME SEEN: 5:30 AM  CHIEF COMPLAINT: Chest pain  HPI: Pt is a 70 y.o. female with history of hypertension, CAD, hyperlipidemia, CVA, breast cancer currently receiving radiation presents emergency department with left-sided chest pain. Pain is described as sharp, severe and worse with deep inspiration. She does have associated shortness of breath. No nausea, vomiting. Has had some dizziness but no diaphoresis. Denies fever or cough. Describes her pain as a "pressing down" pain.  Patient previously was a smoker and quit 4 years ago. Denies history of PE or DVT. She did have surgery to the right breast several weeks ago. She is currently receiving radiation, no chemotherapy. Patient has never had similar pain before.  ROS: See HPI Constitutional: no fever  Eyes: no drainage  ENT: no runny nose   Cardiovascular:   chest pain  Resp:  SOB  GI: no vomiting GU: no dysuria Integumentary: no rash  Allergy: no hives  Musculoskeletal: no leg swelling  Neurological: no slurred speech ROS otherwise negative  PAST MEDICAL HISTORY/PAST SURGICAL HISTORY:  Past Medical History  Diagnosis Date  . Arthritis   . Hypertension   . Chronic bronchitis ([place])   . Myocardial infarction ([place]) 2001  . Cancer ([place]) 2000    breast cancer  . Hyperlipidemia   . Stroke [facility]) 2004    TIA, no deficits  . Chronic bronchitis ([place])   . Restless leg   . Anxiety   . Breast cancer ([place]) 06/13/15    MEDICATIONS:  Prior to Admission medications   Medication Sig Start Date End Date Taking? Authorizing Provider  albuterol (PROAIR HFA) 108 (90 BASE) MCG/ACT inhaler Inhale 2 puffs into the lungs every 4 (four) hours as needed for wheezing. 12/06/13  Yes [name], MD  aspirin 81 MG tablet Take 81 mg by mouth daily.   Yes Historical Provider, MD  atorvastatin (LIPITOR) 10 MG tablet TAKE 1 TABLET BY MOUTH DAILY 02/28/15  Yes [name], MD  calcium carbonate (OS-CAL) 600 MG TABS tablet Take 1,200 mg by mouth  daily with breakfast.    Yes Historical Provider, MD  fluticasone (FLONASE) 50 MCG/ACT nasal spray PLACE 2 SPRAYS INTO BOTH NOSTRILS DAILY. 09/11/15  Yes [name], MD  loratadine (CLARITIN) 10 MG tablet Take 10 mg by mouth daily.   Yes Historical Provider, MD  meclizine (ANTIVERT) 25 MG tablet Take 1 tablet (25 mg total) by mouth 3 (three) times daily as needed for dizziness. 05/15/15  Yes [name], MD  MELATONIN PO Take 1 tablet by mouth at bedtime.   Yes Historical Provider, MD  traMADol (ULTRAM) 50 MG tablet Take 50 mg by mouth every 6 (six) hours as needed for moderate pain. For arthritis 02/07/15  Yes Historical Provider, MD  traZODone (DESYREL) 100 MG tablet TAKE 1 TABLET BY MOUTH EVERY NIGHT AT BEDTIME 03/20/15  Yes [name], MD  valsartan (DIOVAN) 160 MG tablet TAKE 1 TABLET BY MOUTH DAILY 03/06/15  Yes [name], MD  Vitamin D, Cholecalciferol, 400 UNITS CAPS Take 2 capsules by mouth daily.   Yes Historical Provider, MD  cyclobenzaprine (FLEXERIL) 10 MG tablet Take 1 tablet (10 mg total) by mouth 3 (three) times daily as needed for muscle spasms. Patient not taking: Reported on 09/13/2015 11/11/13   [name], MD  HYDROcodone-acetaminophen (NORCO/VICODIN) 5-325 MG per tablet Take 1 tablet by mouth every 6 (six) hours as needed for moderate pain. Patient not taking: Reported on 08/02/2015 10/25/14   Gwyndolyn Saxon  C Martin, PA-C  mometasone (NASONEX) 50 MCG/ACT nasal spray Place 2 sprays into the nose daily. Patient not taking: Reported on 09/13/2015 02/27/15   Debbrah Alar, NP  omeprazole (PRILOSEC) 40 MG capsule TAKE 1 CAPSULE BY MOUTH TWICE DAILY Patient not taking: Reported on 09/13/2015 08/30/15   [name], MD  ondansetron (ZOFRAN) 4 MG tablet Take 1 tablet (4 mg total) by mouth every 8 (eight) hours as needed for nausea or vomiting. Patient not taking: Reported on 09/13/2015 05/15/15   [name], MD  oxyCODONE-acetaminophen (ROXICET)  5-325 MG tablet Take 1-2 tablets by mouth every 4 (four) hours as needed. Patient not taking: Reported on 09/13/2015 07/07/15   Autumn Messing III, MD    ALLERGIES:  Allergies  Allergen Reactions  . Bacitracin-Neomycin-Polymyxin  [Neomycin-Bacitracin Zn-Polymyx] Swelling  . Nsaids Other (See Comments)    nosebleeds  . Tape Hives    Pt cannot tolerate bandaids, tape, or any other adhesives.   . Ambien [Zolpidem Tartrate]     Hives   . Amoxicillin     Rash   . Contrast Media [Iodinated Diagnostic Agents] Hives    Pt states hives with prior ct, was given benadryl to resolve  . Latex Swelling  . Prednisone     SOCIAL HISTORY:  Social History  Substance Use Topics  . Smoking status: Former Smoker -- 1.00 packs/day for 20 years    Types: Cigarettes  . Smokeless tobacco: Never Used     Comment: Quit >4 years ago; 1 ppd for about 5/20 years (remaining was less)  . Alcohol Use: No    FAMILY HISTORY: Family History  Problem Relation Age of Onset  . Diabetes Father   . Lung cancer Sister     dx. <50; former smoker  . Diabetes Brother   . Diabetes Paternal Aunt   . Stroke Maternal Grandmother   . Diabetes Paternal Grandmother   . Colon polyps Neg Hx   . Esophageal cancer Neg Hx   . Gallbladder disease Neg Hx   . Emphysema Mother 10    smoker  . Diabetes Brother   . Brain cancer Brother 48    unknown tumor type  . Cancer Daughter 40    neck cancer  . Other Daughter     hysterectomy for unspecified reason  . Breast cancer Cousin   . Cancer Cousin     unspecified type  . Breast cancer Other     triple negative breast cancer in her 36s    EXAM: BP 145/61 mmHg  Pulse 73  Temp(Src) 98.4 F (36.9 C) (Oral)  Resp 16  SpO2 98% CONSTITUTIONAL: Alert and oriented and responds appropriately to questions. Elderly, appears uncomfortable, afebrile and nontoxic HEAD: Normocephalic EYES: Conjunctivae clear, PERRL ENT: normal nose; no rhinorrhea; moist mucous membranes; pharynx  without lesions noted NECK: Supple, no meningismus, no LAD  CARD: RRR; S1 and S2 appreciated; no murmurs, no clicks, no rubs, no gallops CHEST/BREAST:  Minimally tender to palpation underneath the left breast without crepitus, ecchymosis or deformity. There are no masses appreciated over the left breast tissue and no erythema, warmth or induration. No fluctuance. Patient's nipple appears normal without discharge. RESP: Normal chest excursion without splinting or tachypnea; breath sounds clear and equal bilaterally; no wheezes, no rhonchi, no rales, no hypoxia or respiratory distress, speaking full sentences ABD/GI: Normal bowel sounds; non-distended; soft, non-tender, no rebound, no guarding, no peritoneal signs BACK:  The back appears normal and is non-tender to palpation, there  is no CVA tenderness EXT: Normal ROM in all joints; non-tender to palpation; no edema; normal capillary refill; no cyanosis, no calf tenderness or swelling    SKIN: Normal color for age and race; warm NEURO: Moves all extremities equally, sensation to light touch intact diffusely, cranial nerves II through XII intact PSYCH: The patient's mood and manner are appropriate. Grooming and personal hygiene are appropriate.  MEDICAL DECISION MAKING: Patient here with pleuritic chest pain. She is over the age of 34, had recent surgery and a history of breast cancer. I am concerned for pulmonary embolus. She does have some pain with palpation under her left breast and discussed with her that this could be musculoskeletal in nature but we need to rule out life-threatening processes. Her labs have been unremarkable except for mild anemia which she has had the past. She's had 2 negative troponins. Her chest x-ray is clear. Her EKG shows no ischemic abnormality. I discussed with patient and her daughter that I recommend imaging to rule out pulmonary embolus. Patient and daughter are upset over their wait in the emergency department. I  apologized multiple times to patient and family.  Patient seemed to understand that imaging should be performed but daughter was quite irrational. Daughter yelling at this physician. Unable to calm daughter at all. Unfortunately the daughter was able to convince the patient that they should leave the emergency department. Daughter states "I've been awake since 6 AM yesterday. I'm sure that's longer than you've been awake.  That's 24 hours." Daughter states "you guys have just been sweeping this under the rug all night long". Have explained to daughter about volume of patients, acuity, order of triage and the patient has been monitored closely and has normal vital signs and so far normal labs, EKG and chest x-ray. Have offered to let daughter go home so that she could sleep and we could perform further imaging on patient. She refuses.  Explained to them that she would need a VQ scan or a CT scan. She has had a history of hives with contrast I therefore would need a one hour prep with medications before receiving CT scan. They do not want to wait for the studies to be performed. Have discussed at length with patient and daughter that she could have a life-threatening process that could lead to worsening symptoms, permanent disability and death. Although pt seems to be persuaded by her daughter, she does have the ability to make this decision on her own. She is oriented 3 and I feel she has capacity. I do not feel she is being forced by daughter to make this decision. I have recommended close outpatient follow-up in the next 24 hours with her PCP and oncologist as I feel she will need further workup. I am not convinced that this is musculoskeletal pain. There is no sign of infection currently. Patient is able to verbalize risks back to me. We will have her sign out [place]. Discussed return precautions and recommended if symptoms worsen or she changes her mind to return to the emergency department  immediately. Patient verbalizes understanding. Patient asking for dose of muscle relaxers in the emergency department prior to discharge. Discussed with her that I do not feel comfortable discharging her with medication that may cover up her pain without further workup and she can follow up with her PC and oncologist for further management.      EKG Interpretation  Date/Time:  Wednesday September 13 2015 04:33:49 EST Ventricular Rate:  72 PR Interval:  140 QRS Duration: 67 QT Interval:  554 QTC Calculation: 606 R Axis:   11 Text Interpretation:  Sinus rhythm Borderline T wave abnormalities Prolonged QT interval No significant change since last tracing Confirmed by Langley Flatley,  DO, Levana Minetti [phone]) on 09/13/2015 4:55:31 AM        [place], DO 09/13/15 [id number]

## 2015-09-13 NOTE — ED Notes (Signed)
Pt chose to leave AMA. Departed with family member and in NAD.

## 2015-09-13 NOTE — ED Notes (Signed)
Pt here for chest pain. sts left chest into arm and throbbing. sts was discharged here this am AMA

## 2015-09-13 NOTE — ED Provider Notes (Signed)
CSN: [id number]     Arrival date & time 09/13/15  1146 History   First MD Initiated Contact with Patient 09/13/15 1244     Chief Complaint  Patient presents with  . Chest Pain     (Consider location/radiation/quality/duration/timing/severity/associated sxs/prior Treatment) HPI Comments: 70yo F w/ PMH including HTN, CAD, HLD, CVA, breast cancer currently on treatment who p/w chest pain. Patient states that yesterday at approximately 5 PM, she began having left-sided chest pain that radiates down her left arm. The pain is constant but intermittently becomes severe. The pain is worse with deep inspiration and she feels mild shortness of breath because of that. She had nausea yesterday but denies any nausea or vomiting today. She has some diaphoresis yesterday but none today. She denies any abdominal pain, diarrhea, fevers, or cough/cold symptoms. She was evaluated in the ED early this morning and left AMA prior to obtaining CT chest to rule out PE. She returns now because her chest pain has continued at home. She took 1 baby aspirin this morning.  Patient is a 70 y.o. female presenting with chest pain. The history is provided by the patient.  Chest Pain   Past Medical History  Diagnosis Date  . Arthritis   . Hypertension   . Chronic bronchitis ([place])   . Myocardial infarction ([place]) 2001  . Cancer ([place]) 2000    breast cancer  . Hyperlipidemia   . Stroke [facility]) 2004    TIA, no deficits  . Chronic bronchitis ([place])   . Restless leg   . Anxiety   . Breast cancer ([place]) 06/13/15   Past Surgical History  Procedure Laterality Date  . Abdominal hysterectomy  1985  . Small intestine surgery    . Tubal ligation    . Breast surgery  2001    lt breast lumpectomy  . Radioactive seed guided mastectomy with axillary sentinel lymph node biopsy Right 07/07/2015    Procedure: RIGHT RADIOACTIVE SEED GUIDED PARTIAL MASTECTOMY WITH AXILLARY SENTINEL LYMPH NODE BIOPSY;  Surgeon: Autumn Messing III, MD;   Location: [facility];  Service: General;  Laterality: Right;   Family History  Problem Relation Age of Onset  . Diabetes Father   . Lung cancer Sister     dx. <50; former smoker  . Diabetes Brother   . Diabetes Paternal Aunt   . Stroke Maternal Grandmother   . Diabetes Paternal Grandmother   . Colon polyps Neg Hx   . Esophageal cancer Neg Hx   . Gallbladder disease Neg Hx   . Emphysema Mother 31    smoker  . Diabetes Brother   . Brain cancer Brother 69    unknown tumor type  . Cancer Daughter 26    neck cancer  . Other Daughter     hysterectomy for unspecified reason  . Breast cancer Cousin   . Cancer Cousin     unspecified type  . Breast cancer Other     triple negative breast cancer in her 93s   Social History  Substance Use Topics  . Smoking status: Former Smoker -- 1.00 packs/day for 20 years    Types: Cigarettes  . Smokeless tobacco: Never Used     Comment: Quit >4 years ago; 1 ppd for about 5/20 years (remaining was less)  . Alcohol Use: No   OB History    No data available     Review of Systems  Cardiovascular: Positive for chest pain.   10 Systems reviewed and are negative  for acute change except as noted in the HPI.    Allergies  Bacitracin-neomycin-polymyxin ; Nsaids; Tape; Ambien; Amoxicillin; Contrast media; Latex; and Prednisone  Home Medications   Prior to Admission medications   Medication Sig Start Date End Date Taking? Authorizing Provider  albuterol (PROAIR HFA) 108 (90 BASE) MCG/ACT inhaler Inhale 2 puffs into the lungs every 4 (four) hours as needed for wheezing. 12/06/13  Yes [name], MD  aspirin 81 MG tablet Take 81 mg by mouth daily.   Yes Historical Provider, MD  atorvastatin (LIPITOR) 10 MG tablet TAKE 1 TABLET BY MOUTH DAILY 02/28/15  Yes [name], MD  calcium carbonate (OS-CAL) 600 MG TABS tablet Take 1,200 mg by mouth daily with breakfast.    Yes Historical Provider, MD  cyclobenzaprine (FLEXERIL)  10 MG tablet Take 1 tablet (10 mg total) by mouth 3 (three) times daily as needed for muscle spasms. 11/11/13  Yes [name], MD  fluticasone (FLONASE) 50 MCG/ACT nasal spray PLACE 2 SPRAYS INTO BOTH NOSTRILS DAILY. 09/11/15  Yes [name], MD  HYDROcodone-acetaminophen (NORCO/VICODIN) 5-325 MG per tablet Take 1 tablet by mouth every 6 (six) hours as needed for moderate pain. 10/25/14  Yes Brunetta Jeans, PA-C  loratadine (CLARITIN) 10 MG tablet Take 10 mg by mouth daily.   Yes Historical Provider, MD  meclizine (ANTIVERT) 25 MG tablet Take 1 tablet (25 mg total) by mouth 3 (three) times daily as needed for dizziness. 05/15/15  Yes [name], MD  MELATONIN PO Take 1 tablet by mouth at bedtime.   Yes Historical Provider, MD  mometasone (NASONEX) 50 MCG/ACT nasal spray Place 2 sprays into the nose daily. 02/27/15  Yes Debbrah Alar, NP  omeprazole (PRILOSEC) 40 MG capsule TAKE 1 CAPSULE BY MOUTH TWICE DAILY 08/30/15  Yes [name], MD  traMADol (ULTRAM) 50 MG tablet Take 50 mg by mouth every 6 (six) hours as needed for moderate pain. For arthritis 02/07/15  Yes Historical Provider, MD  traZODone (DESYREL) 100 MG tablet TAKE 1 TABLET BY MOUTH EVERY NIGHT AT BEDTIME 03/20/15  Yes [name], MD  valsartan (DIOVAN) 160 MG tablet TAKE 1 TABLET BY MOUTH DAILY 03/06/15  Yes [name], MD  Vitamin D, Cholecalciferol, 400 UNITS CAPS Take 2 capsules by mouth daily.   Yes Historical Provider, MD  ondansetron (ZOFRAN) 4 MG tablet Take 1 tablet (4 mg total) by mouth every 8 (eight) hours as needed for nausea or vomiting. Patient not taking: Reported on 09/13/2015 05/15/15   [name], MD  oxyCODONE-acetaminophen (ROXICET) 5-325 MG tablet Take 1-2 tablets by mouth every 4 (four) hours as needed. Patient not taking: Reported on 09/13/2015 07/07/15   Autumn Messing III, MD   BP 119/59 mmHg  Pulse 72  Temp(Src) 98.4 F (36.9 C) (Oral)  Resp 14  SpO2 98% Physical  Exam  Constitutional: She is oriented to person, place, and time. She appears well-developed and well-nourished. No distress.  uncomfortable  HENT:  Head: Normocephalic and atraumatic.  Moist mucous membranes  Eyes: Conjunctivae are normal. Pupils are equal, round, and reactive to light.  Neck: Neck supple.  Cardiovascular: Normal rate, regular rhythm and normal heart sounds.   No murmur heard. Pulmonary/Chest: Effort normal and breath sounds normal.  Win[place] with deep inspiration  Abdominal: Soft. Bowel sounds are normal. She exhibits no distension. There is no tenderness.  Musculoskeletal: She exhibits no edema.  Neurological: She is alert and oriented to person,  place, and time.  Fluent speech  Skin: Skin is warm and dry.  Psychiatric: She has a normal mood and affect. Judgment normal.  Nursing note and vitals reviewed.   ED Course  Procedures (including critical care time) [place], ED    Imaging Review Dg Chest 2 View  09/13/2015  CLINICAL DATA:  Left chest pain since 5 p.m. History of heart attack 20 years ago. EXAM: CHEST  2 VIEW COMPARISON:  09/06/2014 FINDINGS: Normal heart size and pulmonary vascularity. No focal airspace disease or consolidation in the lungs. No blunting of costophrenic angles. No pneumothorax. Mediastinal contours appear intact. Calcification of the aorta. Surgical clips in the right axilla. Degenerative changes in the spine and shoulders. Contour deformity of the left breast consistent with postoperative change. IMPRESSION: No active cardiopulmonary disease. Electronically Signed   By: Lucienne Capers M.D.   On: 09/13/2015 01:18   I have personally reviewed and evaluated these lab results as part of my medical decision-making.   EKG Interpretation   Date/Time:  Wednesday September 13 2015 11:51:47 EST Ventricular Rate:  90 PR Interval:  120 QRS Duration: 76 QT Interval:  392 QTC Calculation: 479 R Axis:    31 Text Interpretation:  Normal sinus rhythm Nonspecific ST and T wave  abnormality Abnormal ECG borderline ST depression in aVF, subtle T wave  inversion in aVL compared to previous Confirmed by LITTLE MD, RACHEL  [phone]) on 09/13/2015 1:02:13 PM     Medications  sodium chloride 0.9 % bolus 1,000 mL (1,000 mLs Intravenous New Bag/Given 09/13/15 1403)  HYDROcodone-acetaminophen (NORCO/VICODIN) 5-325 MG per tablet 1 tablet (1 tablet Oral Given 09/13/15 1401)  aspirin chewable tablet 243 mg (243 mg Oral Given 09/13/15 1401)  diphenhydrAMINE (BENADRYL) injection 50 mg (50 mg Intravenous Given 09/13/15 1403)  methylPREDNISolone sodium succinate (SOLU-MEDROL) 125 mg/2 mL injection 125 mg (125 mg Intravenous Given 09/13/15 1405)    MDM   Final diagnoses:  Pleuritic chest pain  Breast cancer H/o CAD   PT w/ h/o CAD and breast cancer p/w 1 day of chest pain worse w/ deep inspiration. She was evaluated early this morning but left AGAINST MEDICAL ADVICE prior to CTA to rule out PE. On exam, she was awake, alert, uncomfortable but in no acute distress. Vital signs unremarkable. EKG with subtle T wave inversions in aVL compared to previous but otherwise no significant change. Normal work of breathing, 96% on room air. Repeat troponin is normal. I discussed risks and benefits of obtaining CT scan given the patient's history of hives with contrast. The patient has received contrast after Benadryl and steroids previously and states she did not have problems tolerating it. Ordered Benadryl and Solu-Medrol before CTA of chest. I am concerned about the patient's ongoing chest pain in the setting of known CAD. I have discussed admission for chest pain rule out with [facility] hospitalist, Dr. Wyline Copas. Pt admitted for observation and chest pain rule out; hospitalist will follow up CT study.   Sharlett Iles, MD 09/13/15 [id number]

## 2015-09-13 NOTE — Discharge Instructions (Signed)
You were seen in the emergency department for concerning chest pain. Your cardiac labs show no sign of heart attack but given you have had recent surgery, are over the age of 21 and have a history of cancer you are at high risk for blood clot in your lungs. I feel that you should have stayed in the emergency department to receive further imaging that you and your daughter have decided to leave [place]. Please follow-up with your primary care physician immediately as I feel you need further testing. If you develop worsening symptoms or change your mind and would like to have further testing done, please return to the hospital.   You have accepted responsibility for the fact that you could have life-threatening illness present that we have not been able to fully evaluate. Your labs, chest x-ray, EKG were normal today and her vital signs were stable but I still feel this needs further workup because of your significant risk factors. If this were a blood clot it could lead to worsening symptoms, permanent disability and even death.   You should follow-up with your doctor for further pain management.   Nonspecific Chest Pain  Chest pain can be caused by many different conditions. There is always a chance that your pain could be related to something serious, such as a heart attack or a blood clot in your lungs. Chest pain can also be caused by conditions that are not life-threatening. If you have chest pain, it is very important to follow up with your health care provider. CAUSES  Chest pain can be caused by:  Heartburn.  Pneumonia or bronchitis.  Anxiety or stress.  Inflammation around your heart (pericarditis) or lung (pleuritis or pleurisy).  A blood clot in your lung.  A collapsed lung (pneumothorax). It can develop suddenly on its own (spontaneous pneumothorax) or from trauma to the chest.  Shingles infection (varicella-zoster virus).  Heart attack.  Damage to the bones,  muscles, and cartilage that make up your chest wall. This can include:  Bruised bones due to injury.  Strained muscles or cartilage due to frequent or repeated coughing or overwork.  Fracture to one or more ribs.  Sore cartilage due to inflammation (costochondritis). RISK FACTORS  Risk factors for chest pain may include:  Activities that increase your risk for trauma or injury to your chest.  Respiratory infections or conditions that cause frequent coughing.  Medical conditions or overeating that can cause heartburn.  Heart disease or family history of heart disease.  Conditions or health behaviors that increase your risk of developing a blood clot.  Having had chicken pox (varicella zoster). SIGNS AND SYMPTOMS Chest pain can feel like:  Burning or tingling on the surface of your chest or deep in your chest.  Crushing, pressure, aching, or squeezing pain.  Dull or sharp pain that is worse when you move, cough, or take a deep breath.  Pain that is also felt in your back, neck, shoulder, or arm, or pain that spreads to any of these areas. Your chest pain may come and go, or it may stay constant. DIAGNOSIS Lab tests or other studies may be needed to find the cause of your pain. Your health care provider may have you take a test called an ambulatory ECG (electrocardiogram). An ECG records your heartbeat patterns at the time the test is performed. You may also have other tests, such as:  Transthoracic echocardiogram (TTE). During echocardiography, sound waves are used to create a picture of  all of the heart structures and to look at how blood flows through your heart.  Transesophageal echocardiogram (TEE).This is a more advanced imaging test that obtains images from inside your body. It allows your health care provider to see your heart in finer detail.  Cardiac monitoring. This allows your health care provider to monitor your heart rate and rhythm in real time.  Holter  monitor. This is a portable device that records your heartbeat and can help to diagnose abnormal heartbeats. It allows your health care provider to track your heart activity for several days, if needed.  Stress tests. These can be done through exercise or by taking medicine that makes your heart beat more quickly.  Blood tests.  Imaging tests. TREATMENT  Your treatment depends on what is causing your chest pain. Treatment may include:  Medicines. These may include:  Acid blockers for heartburn.  Anti-inflammatory medicine.  Pain medicine for inflammatory conditions.  Antibiotic medicine, if an infection is present.  Medicines to dissolve blood clots.  Medicines to treat coronary artery disease.  Supportive care for conditions that do not require medicines. This may include:  Resting.  Applying heat or cold packs to injured areas.  Limiting activities until pain decreases. HOME CARE INSTRUCTIONS  If you were prescribed an antibiotic medicine, finish it all even if you start to feel better.  Avoid any activities that bring on chest pain.  Do not use any tobacco products, including cigarettes, chewing tobacco, or electronic cigarettes. If you need help quitting, ask your health care provider.  Do not drink alcohol.  Take medicines only as directed by your health care provider.  Keep all follow-up visits as directed by your health care provider. This is important. This includes any further testing if your chest pain does not go away.  If heartburn is the cause for your chest pain, you may be told to keep your head raised (elevated) while sleeping. This reduces the chance that acid will go from your stomach into your esophagus.  Make lifestyle changes as directed by your health care provider. These may include:  Getting regular exercise. Ask your health care provider to suggest some activities that are safe for you.  Eating a heart-healthy diet. A registered dietitian  can help you to learn healthy eating options.  Maintaining a healthy weight.  Managing diabetes, if necessary.  Reducing stress. SEEK MEDICAL CARE IF:  Your chest pain does not go away after treatment.  You have a rash with blisters on your chest.  You have a fever. SEEK IMMEDIATE MEDICAL CARE IF:   Your chest pain is worse.  You have an increasing cough, or you cough up blood.  You have severe abdominal pain.  You have severe weakness.  You faint.  You have chills.  You have sudden, unexplained chest discomfort.  You have sudden, unexplained discomfort in your arms, back, neck, or jaw.  You have shortness of breath at any time.  You suddenly start to sweat, or your skin gets clammy.  You feel nauseous or you vomit.  You suddenly feel light-headed or dizzy.  Your heart begins to beat quickly, or it feels like it is skipping beats. These symptoms may represent a serious problem that is an emergency. Do not wait to see if the symptoms will go away. Get medical help right away. Call your local emergency services (911 in the [place] Do not drive yourself to the hospital.   This information is not intended to replace advice given  to you by your health care provider. Make sure you discuss any questions you have with your health care provider.   Document Released: 04/24/2005 Document Revised: 08/05/2014 Document Reviewed: 02/18/2014 Elsevier Interactive Patient Education 2016 [place] of Discharge  Against Medical Advice   And Release of Liability    Because I am choosing to leave the hospital in spite of these risks, I release the hospital, its employees and officers, and my attending physician from all liability for any adverse results caused by my leaving the hospital prematurely.   ________________________________      _____________________________________ Patient's Signature                                        Date and  Time   ________________________________      _____________________________________ Witness' Signature                                        Relationship to Patient    ________________________________      _____________________________________ Witness' Signature                                        Relationship to Patient   [If patient refuses to sign, write "Patient refused to sign" on the patient's signature line and have witnesses to the refusal sign as witnesses.]

## 2015-09-13 NOTE — ED Notes (Signed)
Patient transported to CT 

## 2015-09-14 ENCOUNTER — Ambulatory Visit: Payer: Medicare Other

## 2015-09-14 ENCOUNTER — Observation Stay (HOSPITAL_BASED_OUTPATIENT_CLINIC_OR_DEPARTMENT_OTHER): Payer: Medicare Other

## 2015-09-14 ENCOUNTER — Observation Stay (HOSPITAL_COMMUNITY): Payer: Medicare Other

## 2015-09-14 ENCOUNTER — Ambulatory Visit
Admit: 2015-09-14 | Discharge: 2015-09-14 | Disposition: A | Discharge status: Home / Self Care | Payer: Medicare Other | Attending: Radiation Oncology | Admitting: Radiation Oncology

## 2015-09-14 ENCOUNTER — Other Ambulatory Visit (HOSPITAL_COMMUNITY): Payer: Medicare Other

## 2015-09-14 DIAGNOSIS — J42 Unspecified chronic bronchitis: Secondary | ICD-10-CM | POA: Diagnosis not present

## 2015-09-14 DIAGNOSIS — R0789 Other chest pain: Secondary | ICD-10-CM | POA: Diagnosis not present

## 2015-09-14 DIAGNOSIS — C50411 Malignant neoplasm of upper-outer quadrant of right female breast: Secondary | ICD-10-CM | POA: Diagnosis not present

## 2015-09-14 DIAGNOSIS — M1990 Unspecified osteoarthritis, unspecified site: Secondary | ICD-10-CM | POA: Diagnosis not present

## 2015-09-14 DIAGNOSIS — R079 Chest pain, unspecified: Secondary | ICD-10-CM | POA: Diagnosis not present

## 2015-09-14 DIAGNOSIS — E785 Hyperlipidemia, unspecified: Secondary | ICD-10-CM

## 2015-09-14 DIAGNOSIS — R0781 Pleurodynia: Secondary | ICD-10-CM | POA: Diagnosis not present

## 2015-09-14 DIAGNOSIS — E1169 Type 2 diabetes mellitus with other specified complication: Secondary | ICD-10-CM | POA: Diagnosis not present

## 2015-09-14 DIAGNOSIS — I10 Essential (primary) hypertension: Secondary | ICD-10-CM | POA: Diagnosis not present

## 2015-09-14 LAB — COMPREHENSIVE METABOLIC PANEL
ALT: 15 U/L (ref 14–54)
ANION GAP: 14 (ref 5–15)
AST: 25 U/L (ref 15–41)
Albumin: 2.9 g/dL — ABNORMAL LOW (ref 3.5–5.0)
Alkaline Phosphatase: 83 U/L (ref 38–126)
BILIRUBIN TOTAL: 0.2 mg/dL — AB (ref 0.3–1.2)
BUN: 7 mg/dL (ref 6–20)
CO2: 20 mmol/L — ABNORMAL LOW (ref 22–32)
Calcium: 9 mg/dL (ref 8.9–10.3)
Chloride: 106 mmol/L (ref 101–111)
Creatinine, Ser: 0.97 mg/dL (ref 0.44–1.00)
GFR, EST NON AFRICAN AMERICAN: 57 mL/min — AB (ref >60)
Glucose, Bld: 185 mg/dL — ABNORMAL HIGH (ref 65–99)
POTASSIUM: 4 mmol/L (ref 3.5–5.1)
Sodium: 140 mmol/L (ref 135–145)
TOTAL PROTEIN: 6.9 g/dL (ref 6.5–8.1)

## 2015-09-14 LAB — CBC
HEMATOCRIT: 28.8 % — AB (ref 36.0–46.0)
HEMOGLOBIN: 8.9 g/dL — AB (ref 12.0–15.0)
MCH: 25.7 pg — ABNORMAL LOW (ref 26.0–34.0)
MCHC: 30.9 g/dL (ref 30.0–36.0)
MCV: 83.2 fL (ref 78.0–100.0)
Platelets: 334 10*3/uL (ref 150–400)
RBC: 3.46 MIL/uL — AB (ref 3.87–5.11)
RDW: 14.6 % (ref 11.5–15.5)
WBC: 6.3 10*3/uL (ref 4.0–10.5)

## 2015-09-14 LAB — NM MYOCAR MULTI W/SPECT W/WALL MOTION / EF
CHL CUP NUCLEAR SDS: 3
CHL CUP NUCLEAR SRS: 5
CHL CUP RESTING HR STRESS: 76 {beats}/min
CSEPED: 5 min
CSEPEDS: 1 s
Estimated workload: 1 METS
LV dias vol: 70 mL
LV sys vol: 22 mL
Peak HR: 107 {beats}/min
RATE: 0.72
SSS: 8
TID: 1.59

## 2015-09-14 LAB — GLUCOSE, CAPILLARY
GLUCOSE-CAPILLARY: 145 mg/dL — AB (ref 65–99)
Glucose-Capillary: 124 mg/dL — ABNORMAL HIGH (ref 65–99)

## 2015-09-14 MED ORDER — TECHNETIUM TC 99M SESTAMIBI GENERIC - CARDIOLITE
10.0000 | Freq: Once | INTRAVENOUS | Status: AC | PRN
  Administered 2015-09-14: 10 via INTRAVENOUS

## 2015-09-14 MED ORDER — REGADENOSON 0.4 MG/5ML IV SOLN
INTRAVENOUS | Status: AC
  Filled 2015-09-14: qty 5

## 2015-09-14 MED ORDER — TECHNETIUM TC 99M SESTAMIBI GENERIC - CARDIOLITE
30.0000 | Freq: Once | INTRAVENOUS | Status: AC | PRN
  Administered 2015-09-14: 30 via INTRAVENOUS

## 2015-09-14 MED ORDER — DIPHENHYDRAMINE HCL 25 MG PO CAPS
25.0000 mg | ORAL_CAPSULE | Freq: Once | ORAL | Status: AC
  Administered 2015-09-14: 25 mg via ORAL

## 2015-09-14 MED ORDER — DIPHENHYDRAMINE HCL 25 MG PO CAPS
25.0000 mg | ORAL_CAPSULE | Freq: Four times a day (QID) | ORAL | Status: DC | PRN
  Filled 2015-09-14: qty 1

## 2015-09-14 MED ORDER — REGADENOSON 0.4 MG/5ML IV SOLN
0.4000 mg | Freq: Once | INTRAVENOUS | Status: AC
  Administered 2015-09-14: 0.4 mg via INTRAVENOUS
  Filled 2015-09-14: qty 5

## 2015-09-14 NOTE — Discharge Instructions (Signed)

## 2015-09-14 NOTE — Care Management Obs Status (Signed)
[facility] NOTIFICATION   Patient Details  Name: Tracy [name] MRN: [id number] Date of Birth: 24-Mar-1946   Medicare Observation Status Notification Given:  Yes    Bethena Roys, RN 09/14/2015, 12:23 PM

## 2015-09-14 NOTE — Consult Note (Signed)
CARDIOLOGY CONSULT NOTE       Patient ID: KALEYA DEER MRN: [id number] DOB/AGE: 1945/10/20 70 y.o.  Admit date: 09/13/2015 Referring Physician:  Sloan Leiter Primary Physician: [name], MD Primary Cardiologist:  New Reason for Consultation: Chest Pain  Active Problems:   HTN (hypertension)   Chest pain   Atypical chest pain   Hyperlipidemia associated with type 2 diabetes mellitus ([place])   Breast cancer of upper-outer quadrant of right female breast ([place])   HPI:  70 y.o. being Rx for recurrent breast cancer now on right side.  XRT.  Has had muscular sounding pain on left side, chest shoulder and arm.  Pain is positional Not pleuritic.   CTA negative for acute findings or PE  CRF;s HTN and elevated lipids Previous 20 pack year smoker. PMH indicates MI in 2001 but history is very vague and not clear to me That she has ever had one.  Enzymes negative and ECG with nonspecific ST changes.  Pain this am is positional in shoulder arm and side.  Thinks she has some swelling  In axilla from previous breast cancer surgery No dyspnea, palpitations or syncope   ROS All other systems reviewed and negative except as noted above  Past Medical History  Diagnosis Date  . Arthritis   . Hypertension   . Chronic bronchitis ([place])   . Myocardial infarction ([place]) 2001  . Cancer ([place]) 2000    breast cancer  . Hyperlipidemia   . Stroke [facility]) 2004    TIA, no deficits  . Chronic bronchitis ([place])   . Restless leg   . Anxiety   . Breast cancer ([place]) 06/13/15    Family History  Problem Relation Age of Onset  . Diabetes Father   . Lung cancer Sister     dx. <50; former smoker  . Diabetes Brother   . Diabetes Paternal Aunt   . Stroke Maternal Grandmother   . Diabetes Paternal Grandmother   . Colon polyps Neg Hx   . Esophageal cancer Neg Hx   . Gallbladder disease Neg Hx   . Emphysema Mother 26    smoker  . Diabetes Brother   . Brain cancer Brother 12    unknown tumor type  .  Cancer Daughter 53    neck cancer  . Other Daughter     hysterectomy for unspecified reason  . Breast cancer Cousin   . Cancer Cousin     unspecified type  . Breast cancer Other     triple negative breast cancer in her 38s    Social History   Social History  . Marital Status: Divorced    Spouse Name: N/A  . Number of Children: 7  . Years of Education: N/A   Occupational History  . retired    Social History Main Topics  . Smoking status: Former Smoker -- 1.00 packs/day for 20 years    Types: Cigarettes  . Smokeless tobacco: Never Used     Comment: Quit >4 years ago; 1 ppd for about 5/20 years (remaining was less)  . Alcohol Use: No  . Drug Use: No  . Sexual Activity: Not on file   Other Topics Concern  . Not on file   Social History Narrative    Past Surgical History  Procedure Laterality Date  . Abdominal hysterectomy  1985  . Small intestine surgery    . Tubal ligation    . Breast surgery  2001    lt breast lumpectomy  . Radioactive  seed guided mastectomy with axillary sentinel lymph node biopsy Right 07/07/2015    Procedure: RIGHT RADIOACTIVE SEED GUIDED PARTIAL MASTECTOMY WITH AXILLARY SENTINEL LYMPH NODE BIOPSY;  Surgeon: Autumn Messing III, MD;  Location: [facility];  Service: General;  Laterality: Right;     . aspirin  81 mg Oral Daily  . atorvastatin  10 mg Oral Daily  . calcium carbonate  1,250 mg Oral Q breakfast  . heparin  5,000 Units Subcutaneous 3 times per day  . insulin aspart  0-15 Units Subcutaneous TID WC  . irbesartan  150 mg Oral Daily  . loratadine  10 mg Oral Daily  . pantoprazole  40 mg Oral Daily  . regadenoson  0.4 mg Intravenous Once  . sodium chloride flush  3 mL Intravenous Q12H      Physical Exam: Blood pressure 125/52, pulse 78, temperature 98 F (36.7 C), temperature source Oral, resp. rate 16, height 5' (1.524 m), weight 89.767 kg (197 lb 14.4 oz), SpO2 97 %.    Affect appropriate Overweight black female  HEENT:  normal Neck supple with no adenopathy JVP normal no bruits no thyromegaly Lungs clear with no wheezing and good diaphragmatic motion Heart:  S1/S2 no murmur, no rub, gallop or click PMI normal Abdomen: benighn, BS positve, no tenderness, no AAA no bruit.  No HSM or HJR Distal pulses intact with no bruits No edema Neuro non-focal Skin warm and dry No muscular weakness   Labs:   Lab Results  Component Value Date   WBC 6.3 09/14/2015   HGB 8.9* 09/14/2015   HCT 28.8* 09/14/2015   MCV 83.2 09/14/2015   PLT 334 09/14/2015    Recent Labs Lab 09/14/15 0417  NA 140  K 4.0  CL 106  CO2 20*  BUN 7  CREATININE 0.97  CALCIUM 9.0  PROT 6.9  BILITOT 0.2*  ALKPHOS 83  ALT 15  AST 25  GLUCOSE 185*   Lab Results  Component Value Date   TROPONINI <0.03 09/06/2014    Lab Results  Component Value Date   CHOL 166 06/01/2015   CHOL 145 12/29/2014   CHOL 154 05/10/2014   Lab Results  Component Value Date   HDL 79.60 06/01/2015   HDL 60.70 12/29/2014   HDL 56.70 05/10/2014   Lab Results  Component Value Date   LDLCALC 75 06/01/2015   LDLCALC 67 12/29/2014   LDLCALC 79 05/10/2014   Lab Results  Component Value Date   TRIG 57.0 06/01/2015   TRIG 88.0 12/29/2014   TRIG 92.0 05/10/2014   Lab Results  Component Value Date   CHOLHDL 2 06/01/2015   CHOLHDL 2 12/29/2014   CHOLHDL 3 05/10/2014   Lab Results  Component Value Date   LDLDIRECT 154.7 04/05/2013   LDLDIRECT 130.4 04/02/2012      Radiology: Dg Chest 2 View  09/13/2015  CLINICAL DATA:  Left chest pain since 5 p.m. History of heart attack 20 years ago. EXAM: CHEST  2 VIEW COMPARISON:  09/06/2014 FINDINGS: Normal heart size and pulmonary vascularity. No focal airspace disease or consolidation in the lungs. No blunting of costophrenic angles. No pneumothorax. Mediastinal contours appear intact. Calcification of the aorta. Surgical clips in the right axilla. Degenerative changes in the spine and shoulders.  Contour deformity of the left breast consistent with postoperative change. IMPRESSION: No active cardiopulmonary disease. Electronically Signed   By: Lucienne Capers M.D.   On: 09/13/2015 01:18   Ct Angio Chest Pe W/cm &/or Wo  Cm  09/13/2015  CLINICAL DATA:  Breast cancer, radiation therapy and chemotherapy. Shortness of breath and mid to left chest pain today, especially with deep breathing. EXAM: CT ANGIOGRAPHY CHEST WITH CONTRAST TECHNIQUE: Multidetector CT imaging of the chest was performed using the standard protocol during bolus administration of intravenous contrast. Multiplanar CT image reconstructions and MIPs were obtained to evaluate the vascular anatomy. CONTRAST:  69mL OMNIPAQUE IOHEXOL 350 MG/ML SOLN COMPARISON:  None. FINDINGS: Mediastinum/Nodes: Negative for pulmonary embolus. No pathologically enlarged mediastinal, hilar or axillary lymph nodes. Heart size within normal limits. No pericardial effusion. Esophagus is somewhat air-filled and dilated, suggesting dysmotility. Lungs/Pleura: Centrilobular emphysema. Mild dependent atelectasis. Subpleural radiation fibrosis in the anterior left upper lobe. No pleural fluid. Airway is unremarkable. Upper abdomen: Visualized portions of the liver, adrenal glands, left kidney, spleen, pancreas and stomach are grossly unremarkable. Partially imaged gastrohepatic ligament lymph node measures 9 mm. Musculoskeletal: No worrisome lytic or sclerotic lesions. Degenerative changes are seen in the spine. Review of the MIP images confirms the above findings. IMPRESSION: Negative for pulmonary embolus. No acute findings to explain the patient's symptoms. Electronically Signed   By: Lorin Picket M.D.   On: 09/13/2015 16:26    EKG: NSR nonspecific ST / T wave chagnes   ASSESSMENT AND PLAN:  Chest Pain:  Atypical but persistent Multiple CRF;s  Nonspecific ECG changes.  Musculoskeletal overtones  Rx with toradol  Have ordered Lexiscan Myovue to risk  stratify Can be d/c latter today if normal   Breat Cancer:  Undergoing XRT    Anemia:  F/u primary ? releated to CA  ? Check iron indices  HTN:  Well controlled.  Continue current medications and low sodium Dash type diet.    Chol:  On statin   Signed: Jenkins Rouge 09/14/2015, 7:54 AM

## 2015-09-14 NOTE — Discharge Summary (Signed)
PATIENT DETAILS Name: Tracy [name] Age: 70 y.o. Sex: female Date of Birth: January 03, 1946 MRN: [id number]. Admitting Physician: Donne Hazel, MD [id number] Birdie Riddle, MD  Admit Date: 09/13/2015 Discharge date: 09/14/2015  Recommendations for Outpatient Follow-up:  1. Continue to monitor hemoglobin-suspect anemia of chronic disease. Stable for outpatient workup. 2. Please repeat CBC/BMET at next visit  PRIMARY DISCHARGE DIAGNOSIS:  Active Problems:   HTN (hypertension)   Chest pain   Atypical chest pain   Hyperlipidemia associated with type 2 diabetes mellitus ([place])   Breast cancer of upper-outer quadrant of right female breast [facility])      PAST MEDICAL HISTORY: Past Medical History  Diagnosis Date  . Arthritis   . Hypertension   . Chronic bronchitis ([place])   . Myocardial infarction ([place]) 2001  . Cancer ([place]) 2000    breast cancer  . Hyperlipidemia   . Stroke [facility]) 2004    TIA, no deficits  . Chronic bronchitis ([place])   . Restless leg   . Anxiety   . Breast cancer ([place]) 06/13/15    DISCHARGE MEDICATIONS: Current Discharge Medication List    CONTINUE these medications which have NOT CHANGED   Details  albuterol (PROAIR HFA) 108 (90 BASE) MCG/ACT inhaler Inhale 2 puffs into the lungs every 4 (four) hours as needed for wheezing. Qty: 1 Inhaler, Refills: 6    aspirin 81 MG tablet Take 81 mg by mouth daily.    atorvastatin (LIPITOR) 10 MG tablet TAKE 1 TABLET BY MOUTH DAILY Qty: 30 tablet, Refills: 6    calcium carbonate (OS-CAL) 600 MG TABS tablet Take 1,200 mg by mouth daily with breakfast.     cyclobenzaprine (FLEXERIL) 10 MG tablet Take 1 tablet (10 mg total) by mouth 3 (three) times daily as needed for muscle spasms. Qty: 45 tablet, Refills: 0    fluticasone (FLONASE) 50 MCG/ACT nasal spray PLACE 2 SPRAYS INTO BOTH NOSTRILS DAILY. Qty: 48 g, Refills: 3    HYDROcodone-acetaminophen (NORCO/VICODIN) 5-325 MG per tablet Take 1 tablet by mouth every 6 (six) hours  as needed for moderate pain. Qty: 60 tablet, Refills: 0    loratadine (CLARITIN) 10 MG tablet Take 10 mg by mouth daily.    meclizine (ANTIVERT) 25 MG tablet Take 1 tablet (25 mg total) by mouth 3 (three) times daily as needed for dizziness. Qty: 30 tablet, Refills: 1    MELATONIN PO Take 1 tablet by mouth at bedtime.    mometasone (NASONEX) 50 MCG/ACT nasal spray Place 2 sprays into the nose daily. Qty: 17 g, Refills: 2    omeprazole (PRILOSEC) 40 MG capsule TAKE 1 CAPSULE BY MOUTH TWICE DAILY Qty: 60 capsule, Refills: 6    traMADol (ULTRAM) 50 MG tablet Take 50 mg by mouth every 6 (six) hours as needed for moderate pain. For arthritis Refills: 0    traZODone (DESYREL) 100 MG tablet TAKE 1 TABLET BY MOUTH EVERY NIGHT AT BEDTIME Qty: 90 tablet, Refills: 1    valsartan (DIOVAN) 160 MG tablet TAKE 1 TABLET BY MOUTH DAILY Qty: 30 tablet, Refills: 6    Vitamin D, Cholecalciferol, 400 UNITS CAPS Take 2 capsules by mouth daily.      STOP taking these medications     ondansetron (ZOFRAN) 4 MG tablet      oxyCODONE-acetaminophen (ROXICET) 5-325 MG tablet         ALLERGIES:   Allergies  Allergen Reactions  . Bacitracin-Neomycin-Polymyxin  [Neomycin-Bacitracin Zn-Polymyx] Swelling  . Nsaids Other (See Comments)  nosebleeds  . Tape Hives    Pt cannot tolerate bandaids, tape, or any other adhesives.   . Ambien [Zolpidem Tartrate]     Hives   . Amoxicillin     Rash   . Contrast Media [Iodinated Diagnostic Agents] Hives    Pt states hives with prior ct, was given benadryl to resolve  . Latex Swelling  . Prednisone     BRIEF HPI:  See H&P, Labs, Consult and Test reports for all details in brief, patient was admitted for evaluation of chest pain.  CONSULTATIONS:   cardiology  PERTINENT RADIOLOGIC STUDIES: Dg Chest 2 View  09/13/2015  CLINICAL DATA:  Left chest pain since 5 p.m. History of heart attack 20 years ago. EXAM: CHEST  2 VIEW COMPARISON:  09/06/2014  FINDINGS: Normal heart size and pulmonary vascularity. No focal airspace disease or consolidation in the lungs. No blunting of costophrenic angles. No pneumothorax. Mediastinal contours appear intact. Calcification of the aorta. Surgical clips in the right axilla. Degenerative changes in the spine and shoulders. Contour deformity of the left breast consistent with postoperative change. IMPRESSION: No active cardiopulmonary disease. Electronically Signed   By: Lucienne Capers M.D.   On: 09/13/2015 01:18   Ct Angio Chest Pe W/cm &/or Wo Cm  09/13/2015  CLINICAL DATA:  Breast cancer, radiation therapy and chemotherapy. Shortness of breath and mid to left chest pain today, especially with deep breathing. EXAM: CT ANGIOGRAPHY CHEST WITH CONTRAST TECHNIQUE: Multidetector CT imaging of the chest was performed using the standard protocol during bolus administration of intravenous contrast. Multiplanar CT image reconstructions and MIPs were obtained to evaluate the vascular anatomy. CONTRAST:  38mL OMNIPAQUE IOHEXOL 350 MG/ML SOLN COMPARISON:  None. FINDINGS: Mediastinum/Nodes: Negative for pulmonary embolus. No pathologically enlarged mediastinal, hilar or axillary lymph nodes. Heart size within normal limits. No pericardial effusion. Esophagus is somewhat air-filled and dilated, suggesting dysmotility. Lungs/Pleura: Centrilobular emphysema. Mild dependent atelectasis. Subpleural radiation fibrosis in the anterior left upper lobe. No pleural fluid. Airway is unremarkable. Upper abdomen: Visualized portions of the liver, adrenal glands, left kidney, spleen, pancreas and stomach are grossly unremarkable. Partially imaged gastrohepatic ligament lymph node measures 9 mm. Musculoskeletal: No worrisome lytic or sclerotic lesions. Degenerative changes are seen in the spine. Review of the MIP images confirms the above findings. IMPRESSION: Negative for pulmonary embolus. No acute findings to explain the patient's symptoms.  Electronically Signed   By: Lorin Picket M.D.   On: 09/13/2015 16:26   Nm Myocar Multi W/spect W/wall Motion / Ef  09/14/2015   T wave inversion was noted during stress in the V4, V5, V6 and V3 leads, beginning at 1 minutes of stress, ending at 3 minutes of stress.  Defect 1: There is a medium defect of moderate severity.  This is a low risk study.  Nuclear stress EF: 69%.  There was no ST segment deviation noted during stress.  Medium size, moderate intensity fixed basal to mid inferior wall attenuation artifact. No reversible ischemia. LVEF 69% with normal wall motion. This is a low risk study.     PERTINENT LAB RESULTS: CBC:  Recent Labs  09/13/15 2047 09/14/15 0417  WBC 8.3 6.3  HGB 9.6* 8.9*  HCT 30.7* 28.8*  PLT 333 334   CMET CMP     Component Value Date/Time   NA 140 09/14/2015 0417   NA 139 07/13/2015 1516   K 4.0 09/14/2015 0417   K 3.9 07/13/2015 1516   CL 106 09/14/2015 0417  CO2 20* 09/14/2015 0417   CO2 25 07/13/2015 1516   GLUCOSE 185* 09/14/2015 0417   GLUCOSE 85 07/13/2015 1516   BUN 7 09/14/2015 0417   BUN 7.9 07/13/2015 1516   CREATININE 0.97 09/14/2015 0417   CREATININE 0.9 07/13/2015 1516   CREATININE 0.95 05/10/2014 1147   CALCIUM 9.0 09/14/2015 0417   CALCIUM 8.9 07/13/2015 1516   PROT 6.9 09/14/2015 0417   PROT 7.6 07/13/2015 1516   ALBUMIN 2.9* 09/14/2015 0417   ALBUMIN 3.3* 07/13/2015 1516   AST 25 09/14/2015 0417   AST 16 07/13/2015 1516   ALT 15 09/14/2015 0417   ALT 12 07/13/2015 1516   ALKPHOS 83 09/14/2015 0417   ALKPHOS 99 07/13/2015 1516   BILITOT 0.2* 09/14/2015 0417   BILITOT <0.30 07/13/2015 1516   GFRNONAA 57* 09/14/2015 0417   GFRNONAA 61 05/10/2014 1147   GFRAA >60 09/14/2015 0417   GFRAA 70 05/10/2014 1147    GFR Estimated Creatinine Clearance: 53.1 mL/min (by C-G formula based on Cr of 0.97). No results for input(s): LIPASE, AMYLASE in the last 72 hours. No results for input(s): CKTOTAL, CKMB, CKMBINDEX,  TROPONINI in the last 72 hours. Invalid input(s): POCBNP No results for input(s): DDIMER in the last 72 hours. No results for input(s): HGBA1C in the last 72 hours. No results for input(s): CHOL, HDL, LDLCALC, TRIG, CHOLHDL, LDLDIRECT in the last 72 hours. No results for input(s): TSH, T4TOTAL, T3FREE, THYROIDAB in the last 72 hours.  Invalid input(s): FREET3 No results for input(s): VITAMINB12, FOLATE, FERRITIN, TIBC, IRON, RETICCTPCT in the last 72 hours. Coags: No results for input(s): INR in the last 72 hours.  Invalid input(s): PT Microbiology: No results found for this or any previous visit (from the past 240 hour(s)).   BRIEF HOSPITAL COURSE:   Active Problems: Chest pain: With mostly atypical features, EKG/troponins negative. Seen by cardiology-underwent nuclear stress test which is negative. No further recommendations. Stable for discharge. Note, CT angiogram chest negative for pulmonary embolism.  Anemia: Likely secondary to chronic disease-has history of breast cancer. Stable for outpatient monitoring. Does not appear to be symptomatic.  Hypertension: Appears stable, continue losartan  GERD: Continue PPI  History of breast cancer: Continue outpatient follow-up with radiation oncology and medical oncology.  Rest of the patient's medical issues were stable during the short hospital stay.  TODAY-DAY OF DISCHARGE:  Subjective:   Melahni Jemerson today has no headache,no chest abdominal pain,no new weakness tingling or numbness, feels much better wants to go home today.   Objective:   Blood pressure 124/39, pulse 76, temperature 97.9 F (36.6 C), temperature source Oral, resp. rate 18, height 5' (1.524 m), weight 89.767 kg (197 lb 14.4 oz), SpO2 100 %.  Intake/Output Summary (Last 24 hours) at 09/14/15 1610 Last data filed at 09/14/15 1430  Gross per 24 hour  Intake    240 ml  Output   1000 ml  Net   -760 ml   Filed Weights   09/13/15 2027 09/14/15 0436  Weight:  91.037 kg (200 lb 11.2 oz) 89.767 kg (197 lb 14.4 oz)    Exam Awake Alert, Oriented *3, No new F.N deficits, Normal affect [place].AT,PERRAL Supple Neck,No JVD, No cervical lymphadenopathy appriciated.  Symmetrical Chest wall movement, Good air movement bilaterally, CTAB RRR,No Gallops,Rubs or new Murmurs, No Parasternal Heave +ve B.Sounds, Abd Soft, Non tender, No organomegaly appriciated, No rebound -guarding or rigidity. No Cyanosis, Clubbing or edema, No new Rash or bruise  DISCHARGE CONDITION: Stable  DISPOSITION:  Home  DISCHARGE INSTRUCTIONS:    Activity:  As tolerated   Get Medicines reviewed and adjusted: Please take all your medications with you for your next visit with your Primary MD  Please request your Primary MD to go over all hospital tests and procedure/radiological results at the follow up, please ask your Primary MD to get all Hospital records sent to his/her office.  If you experience worsening of your admission symptoms, develop shortness of breath, life threatening emergency, suicidal or homicidal thoughts you must seek medical attention immediately by calling 911 or calling your MD immediately  if symptoms less severe.  You must read complete instructions/literature along with all the possible adverse reactions/side effects for all the Medicines you take and that have been prescribed to you. Take any new Medicines after you have completely understood and accpet all the possible adverse reactions/side effects.   Do not drive when taking Pain medications.   Do not take more than prescribed Pain, Sleep and Anxiety Medications  Special Instructions: If you have smoked or chewed Tobacco  in the last 2 yrs please stop smoking, stop any regular Alcohol  and or any Recreational drug use.  Wear Seat belts while driving.  Please note  You were cared for by a hospitalist during your hospital stay. Once you are discharged, your primary care physician will handle any  further medical issues. Please note that NO REFILLS for any discharge medications will be authorized once you are discharged, as it is imperative that you return to your primary care physician (or establish a relationship with a primary care physician if you do not have one) for your aftercare needs so that they can reassess your need for medications and monitor your lab values.   Diet recommendation: Heart Healthy diet   Discharge Instructions    Call MD for:  severe uncontrolled pain    Complete by:  As directed      Diet - low sodium heart healthy    Complete by:  As directed      Increase activity slowly    Complete by:  As directed            Follow-up Information    Follow up with [name], MD. Go on 09/28/2015.   Specialty:  Family Medicine   Why:  Hospital follow up 09/28/15 @ 11am   Contact information:   [address] [place] [address] [phone]       Total Time spent on discharge equals 25  minutes.  SignedOren Binet 09/14/2015 4:10 PM

## 2015-09-14 NOTE — Progress Notes (Signed)
Pt discharged home. Discharge instructions have been gone over with the patient. IV's removed. Pt given unit number and told to call if they have any concerns regarding their discharge instructions. Maylin [place] V, RN   

## 2015-09-14 NOTE — Progress Notes (Signed)
Patient presented for Lexiscan. Tolerated procedure well. Result to follow.   Mariluz Crespo, PAC 

## 2015-09-15 ENCOUNTER — Ambulatory Visit
Admission: RE | Admit: 2015-09-15 | Discharge: 2015-09-15 | Disposition: A | Discharge status: Home / Self Care | Payer: Medicare Other | Source: Ambulatory Visit | Attending: Radiation Oncology | Admitting: Radiation Oncology

## 2015-09-15 ENCOUNTER — Ambulatory Visit
Admit: 2015-09-15 | Discharge: 2015-09-15 | Disposition: A | Discharge status: Home / Self Care | Payer: Medicare Other | Attending: Radiation Oncology | Admitting: Radiation Oncology

## 2015-09-15 ENCOUNTER — Encounter: Payer: Self-pay | Admitting: Radiation Oncology

## 2015-09-15 ENCOUNTER — Telehealth: Payer: Self-pay | Admitting: Behavioral Health

## 2015-09-15 ENCOUNTER — Ambulatory Visit: Payer: Medicare Other

## 2015-09-15 VITALS — BP 154/80 | HR 76 | Temp 98.6°F

## 2015-09-15 DIAGNOSIS — Z9889 Other specified postprocedural states: Secondary | ICD-10-CM | POA: Diagnosis not present

## 2015-09-15 DIAGNOSIS — Z170 Estrogen receptor positive status [ER+]: Secondary | ICD-10-CM | POA: Diagnosis not present

## 2015-09-15 DIAGNOSIS — C50411 Malignant neoplasm of upper-outer quadrant of right female breast: Secondary | ICD-10-CM

## 2015-09-15 DIAGNOSIS — Z9221 Personal history of antineoplastic chemotherapy: Secondary | ICD-10-CM | POA: Diagnosis not present

## 2015-09-15 DIAGNOSIS — Z510 Encounter for antineoplastic radiation therapy: Secondary | ICD-10-CM | POA: Diagnosis not present

## 2015-09-15 DIAGNOSIS — Z923 Personal history of irradiation: Secondary | ICD-10-CM | POA: Diagnosis not present

## 2015-09-15 MED ORDER — HYDROCODONE-ACETAMINOPHEN 5-325 MG PO TABS: 1.0000 | ORAL_TABLET | Freq: Four times a day (QID) | ORAL | Status: DC | PRN

## 2015-09-15 NOTE — Telephone Encounter (Signed)
Transition Care Management Follow-up Telephone Call  Admitting Physician: Donne Hazel, MD [id number] Birdie Riddle, MD  Admit Date: 09/13/2015 Discharge date: 09/14/2015  Recommendations for Outpatient Follow-up:  1. Continue to monitor hemoglobin-suspect anemia of chronic disease. Stable for outpatient workup. 2. Please repeat CBC/BMET at next visit  PRIMARY DISCHARGE DIAGNOSIS: Active Problems:  HTN (hypertension)  Chest pain  Atypical chest pain  Hyperlipidemia associated with type 2 diabetes mellitus ([facility])  Breast cancer of upper-outer quadrant of right female breast ([place])    How have you been since you were released from the hospital? Patient stated, "Tracy [name] been doing okay, besides my allergies giving me trouble this morning".   Do you understand why you were in the hospital? yes   Do you understand the discharge instructions? yes   Where were you discharged to? Home   Items Reviewed:  Medications reviewed: yes  Allergies reviewed: yes  Dietary changes reviewed: yes, heart healthy diet  Referrals reviewed: None; patient voiced that she was only instructed to follow-up with her PCP.   Functional Questionnaire:   Activities of Daily Living (ADLs):   She states they are independent in the following: ambulation, bathing and hygiene, feeding, continence, grooming, toileting and dressing States they require assistance with the following: None   Any transportation issues/concerns?: no   Any patient concerns? no   Confirmed importance and date/time of follow-up visits scheduled yes, 09/28/15 at 11:00 AM.  Provider Appointment booked with Dr. Birdie Riddle.  Confirmed with patient if condition begins to worsen call PCP or go to the ER.  Patient was given the office number and encouraged to call back with question or concerns.  : yes

## 2015-09-15 NOTE — Progress Notes (Signed)
Weekly Management Note Current Dose: 37.5  Gy  Projected Dose: 50 Gy   Narrative:  Patient asked to be seen to explain "dry wall" pain.  She was hospitalized overnight for left chest pain and r/o MI.  Her tropnins were negative and her pain responded to vicodin.  She has not had the pain since yesterday although her left breast continues to be sore.  She has follow up with her PCP in a week. She recalls being told that radiation was the cause of her left chest pain and was told by the cardiologist and nurse that it was "dry wall" pain.   Physical Findings: Dark right breast.  Filed Vitals:   09/15/15 1421  BP: 154/80  Pulse: 76  Temp: 98.6 F (37 C)    Impression:  The patient is tolerating radiation to the RIGHT breast.   Plan:  Continue treatment as planned. I am encouraged she is feeling better and was able to go home. I am not sure what "dry wall" pain is.  Her radiation is to the right chest wall is not the cause of her left chest pain. I encouraged her to discuss other causes with Dr. Birdie Riddle next week.

## 2015-09-15 NOTE — Progress Notes (Signed)
Tracy [name] has completed 16 fractions to her right breast.  She reports she had pain in her left chest on Tuesday.  She went to the ER and was admitted for "dry wall" which she was told was caused by radiation and was discharged yesterday.  She reports that her testing came back fine except for her stress test which was "low normal."  The skin on her right breast is red and she reports it is painful under her right arm.  She is using radiaplex gel.  BP 154/80 mmHg  Pulse 76  Temp(Src) 98.6 F (37 C) (Oral)  SpO2 100%

## 2015-09-16 ENCOUNTER — Other Ambulatory Visit: Payer: Self-pay | Admitting: Family Medicine

## 2015-09-18 ENCOUNTER — Ambulatory Visit
Admit: 2015-09-18 | Discharge: 2015-09-18 | Disposition: A | Discharge status: Home / Self Care | Payer: Medicare Other | Attending: Radiation Oncology | Admitting: Radiation Oncology

## 2015-09-18 ENCOUNTER — Ambulatory Visit: Payer: Medicare Other

## 2015-09-18 DIAGNOSIS — Z923 Personal history of irradiation: Secondary | ICD-10-CM | POA: Diagnosis not present

## 2015-09-18 DIAGNOSIS — C50411 Malignant neoplasm of upper-outer quadrant of right female breast: Secondary | ICD-10-CM | POA: Diagnosis not present

## 2015-09-18 DIAGNOSIS — Z9889 Other specified postprocedural states: Secondary | ICD-10-CM | POA: Diagnosis not present

## 2015-09-18 DIAGNOSIS — Z170 Estrogen receptor positive status [ER+]: Secondary | ICD-10-CM | POA: Diagnosis not present

## 2015-09-18 DIAGNOSIS — Z510 Encounter for antineoplastic radiation therapy: Secondary | ICD-10-CM | POA: Diagnosis not present

## 2015-09-18 DIAGNOSIS — Z9221 Personal history of antineoplastic chemotherapy: Secondary | ICD-10-CM | POA: Diagnosis not present

## 2015-09-18 NOTE — Telephone Encounter (Signed)
Medication filled to pharmacy as requested.   

## 2015-09-19 ENCOUNTER — Ambulatory Visit
Admission: RE | Admit: 2015-09-19 | Discharge: 2015-09-19 | Disposition: A | Discharge status: Home / Self Care | Payer: Medicare Other | Source: Ambulatory Visit | Attending: Radiation Oncology | Admitting: Radiation Oncology

## 2015-09-19 ENCOUNTER — Ambulatory Visit: Payer: Medicare Other

## 2015-09-19 ENCOUNTER — Ambulatory Visit
Admit: 2015-09-19 | Discharge: 2015-09-19 | Disposition: A | Discharge status: Home / Self Care | Payer: Medicare Other | Attending: Radiation Oncology | Admitting: Radiation Oncology

## 2015-09-19 ENCOUNTER — Encounter: Payer: Self-pay | Admitting: Radiation Oncology

## 2015-09-19 VITALS — BP 130/63 | HR 86 | Temp 99.3°F | Ht 60.0 in | Wt 199.2 lb

## 2015-09-19 DIAGNOSIS — Z9221 Personal history of antineoplastic chemotherapy: Secondary | ICD-10-CM | POA: Diagnosis not present

## 2015-09-19 DIAGNOSIS — N644 Mastodynia: Secondary | ICD-10-CM | POA: Diagnosis not present

## 2015-09-19 DIAGNOSIS — C50411 Malignant neoplasm of upper-outer quadrant of right female breast: Secondary | ICD-10-CM | POA: Diagnosis not present

## 2015-09-19 DIAGNOSIS — Z923 Personal history of irradiation: Secondary | ICD-10-CM | POA: Diagnosis not present

## 2015-09-19 DIAGNOSIS — R0789 Other chest pain: Secondary | ICD-10-CM | POA: Diagnosis not present

## 2015-09-19 DIAGNOSIS — C50911 Malignant neoplasm of unspecified site of right female breast: Secondary | ICD-10-CM | POA: Insufficient documentation

## 2015-09-19 DIAGNOSIS — Z170 Estrogen receptor positive status [ER+]: Secondary | ICD-10-CM | POA: Diagnosis not present

## 2015-09-19 DIAGNOSIS — Z9889 Other specified postprocedural states: Secondary | ICD-10-CM | POA: Diagnosis not present

## 2015-09-19 DIAGNOSIS — Z510 Encounter for antineoplastic radiation therapy: Secondary | ICD-10-CM | POA: Diagnosis not present

## 2015-09-19 DIAGNOSIS — R040 Epistaxis: Secondary | ICD-10-CM | POA: Diagnosis not present

## 2015-09-19 MED ORDER — SONAFINE EX EMUL
1.0000 "application " | Freq: Two times a day (BID) | CUTANEOUS | Status: DC
  Administered 2015-09-19: 1 via TOPICAL
  Filled 2015-09-19: qty 45

## 2015-09-19 NOTE — Progress Notes (Signed)
Weekly Management Note Current Dose:  40 Gy  Projected Dose: 50 Gy   Narrative:  The patient presents for routine under treatment assessment.  CBCT/MVCT images/Port film x-rays were reviewed.  The chart was checked. Breast pain and chest wall pain continues. Taking benadryl and vicodin at night to sleep.  Very somnelent during the day. Has appt with medical oncology in March. Skin irritation in axilla is worse.   Physical Findings: Weight: 199 lb 3.2 oz (90.357 kg). Pink left breast. Tender to touch. Skin darkening in axilla.   Impression:  The patient is tolerating radiation.  Plan:  Continue treatment as planned. Switch to biafene. Per pt she cannot take anti-inflammatories due to nosebleeds. Discussed survivorship and [employer]. Encouraged physical activity. Tiredness is likely due to meds plus RT.

## 2015-09-19 NOTE — Addendum Note (Signed)
Encounter addended by: Malena Edman, RN on: 09/19/2015  3:42 PM<BR>     Documentation filed: Orders, [facility], Inpatient [facility]

## 2015-09-19 NOTE — Addendum Note (Signed)
Encounter addended by: Arbutus Ped, Susan Moore on: 09/19/2015  3:44 PM<BR>     Documentation filed: Rx Order Verification

## 2015-09-19 NOTE — Progress Notes (Addendum)
Tracy [name] has received 16 fractions to her right breast.  Skin to right breast skin color hyperpigmentation with burning,sensitive to touch using Radiaplex.  Appetite is good.  Pain 7/10 to right breast taking Vicodin for pain control.  Has very low energy level. Changed from Radiaplex gel to Sonafine today. BP 130/63 mmHg  Pulse 86  Temp(Src) 99.3 F (37.4 C)  Ht 5' (1.524 m)  Wt 199 lb 3.2 oz (90.357 kg)  BMI 38.90 kg/m2  SpO2 100%

## 2015-09-20 ENCOUNTER — Ambulatory Visit: Payer: Medicare Other

## 2015-09-20 ENCOUNTER — Ambulatory Visit
Admit: 2015-09-20 | Discharge: 2015-09-20 | Disposition: A | Discharge status: Home / Self Care | Payer: Medicare Other | Attending: Radiation Oncology | Admitting: Radiation Oncology

## 2015-09-20 DIAGNOSIS — Z9221 Personal history of antineoplastic chemotherapy: Secondary | ICD-10-CM | POA: Diagnosis not present

## 2015-09-20 DIAGNOSIS — Z923 Personal history of irradiation: Secondary | ICD-10-CM | POA: Diagnosis not present

## 2015-09-20 DIAGNOSIS — C50411 Malignant neoplasm of upper-outer quadrant of right female breast: Secondary | ICD-10-CM | POA: Diagnosis not present

## 2015-09-20 DIAGNOSIS — Z9889 Other specified postprocedural states: Secondary | ICD-10-CM | POA: Diagnosis not present

## 2015-09-20 DIAGNOSIS — Z170 Estrogen receptor positive status [ER+]: Secondary | ICD-10-CM | POA: Diagnosis not present

## 2015-09-20 DIAGNOSIS — Z510 Encounter for antineoplastic radiation therapy: Secondary | ICD-10-CM | POA: Diagnosis not present

## 2015-09-21 ENCOUNTER — Encounter: Payer: Self-pay | Admitting: Radiation Oncology

## 2015-09-21 ENCOUNTER — Ambulatory Visit
Admission: RE | Admit: 2015-09-21 | Discharge: 2015-09-21 | Disposition: A | Discharge status: Home / Self Care | Payer: Medicare Other | Source: Ambulatory Visit | Attending: Radiation Oncology | Admitting: Radiation Oncology

## 2015-09-21 ENCOUNTER — Telehealth: Payer: Self-pay | Admitting: *Deleted

## 2015-09-21 DIAGNOSIS — Z923 Personal history of irradiation: Secondary | ICD-10-CM | POA: Diagnosis not present

## 2015-09-21 DIAGNOSIS — Z9889 Other specified postprocedural states: Secondary | ICD-10-CM | POA: Diagnosis not present

## 2015-09-21 DIAGNOSIS — Z170 Estrogen receptor positive status [ER+]: Secondary | ICD-10-CM | POA: Diagnosis not present

## 2015-09-21 DIAGNOSIS — Z510 Encounter for antineoplastic radiation therapy: Secondary | ICD-10-CM | POA: Diagnosis not present

## 2015-09-21 DIAGNOSIS — Z9221 Personal history of antineoplastic chemotherapy: Secondary | ICD-10-CM | POA: Diagnosis not present

## 2015-09-21 DIAGNOSIS — C50411 Malignant neoplasm of upper-outer quadrant of right female breast: Secondary | ICD-10-CM | POA: Diagnosis not present

## 2015-09-21 NOTE — Telephone Encounter (Signed)
Left message for a return phone call to follow post radiation.

## 2015-09-24 ENCOUNTER — Other Ambulatory Visit: Payer: Self-pay | Admitting: Family Medicine

## 2015-09-25 ENCOUNTER — Other Ambulatory Visit: Payer: Self-pay | Admitting: Adult Health

## 2015-09-25 DIAGNOSIS — C50411 Malignant neoplasm of upper-outer quadrant of right female breast: Secondary | ICD-10-CM

## 2015-09-25 NOTE — Progress Notes (Signed)
  Radiation Oncology         [phone] ________________________________  Name: Tracy [name] MRN: [id number]  Date: 09/21/2015  DOB: 01/29/1946  End of Treatment Note  Diagnosis:  Breast cancer of upper-outer quadrant of right female breast [facility])   Staging form: Breast, AJCC 7th Edition     Pathologic stage from 07/10/2015: Stage IA (T1c, N0, cM0) - Unsigned       Staging comments: Staged on surgical specimen by Dr. Lyndon Code      Clinical: No stage assigned - Unsigned   Indication for treatment:  Curative    Radiation treatment dates:   08/23/2015-09/21/2015  Site/dose:   Right breast/ 42.5Gy at 2.5 Gy per fraction x 17 fractions.  Right breast boost/ 7.5 Gy at 2.5 Gy per fraction x 3 fractions  Beams/energy:  Opposed tangents with reduced fields / 10 and 15 MV photons Enface electrons / 18 MeV  Narrative: The patient tolerated radiation treatment relatively well.   She was unfortunately seen in the ED during treatment for left chest pain which resolved with narcotis.  Her skin tolerated treatment well with some dry desquamation.   Plan: The patient has completed radiation treatment. The patient will return to radiation oncology clinic for routine followup in one month. I advised them to call or return sooner if they have any questions or concerns related to their recovery or treatment.  ------------------------------------------------  [name], MD

## 2015-09-25 NOTE — Telephone Encounter (Signed)
Medication filled to pharmacy as requested.   

## 2015-09-26 NOTE — Assessment & Plan Note (Signed)
Right lumpectomy 07/07/2015: Invasive ductal carcinoma grade 1, 1 cm span, with low-grade DCIS, DCIS focally 0.1 cm to inferior margin, 0/3 lymph nodes negative, ER 95%, PR 5%, HER-2 negative ratio 1.1, Ki-67 10%, T1 cN0 stage IA ( Pior history of triple negative left breast cancer treated with lumpectomy, adjuvant chemotherapy, radiation , in [place], unknown stage 04/16/2000)  Completed adjuvant XRT  Treatment plan: Adj Anti estrogen therapy

## 2015-09-27 ENCOUNTER — Encounter: Payer: Self-pay | Admitting: Hematology and Oncology

## 2015-09-27 ENCOUNTER — Ambulatory Visit (HOSPITAL_BASED_OUTPATIENT_CLINIC_OR_DEPARTMENT_OTHER): Payer: Medicare Other | Admitting: Hematology and Oncology

## 2015-09-27 ENCOUNTER — Telehealth: Payer: Self-pay | Admitting: Hematology and Oncology

## 2015-09-27 VITALS — BP 181/84 | HR 90 | Temp 98.5°F | Resp 18 | Ht 60.0 in | Wt 196.2 lb

## 2015-09-27 DIAGNOSIS — Z171 Estrogen receptor negative status [ER-]: Secondary | ICD-10-CM

## 2015-09-27 DIAGNOSIS — C50411 Malignant neoplasm of upper-outer quadrant of right female breast: Secondary | ICD-10-CM | POA: Diagnosis not present

## 2015-09-27 NOTE — Telephone Encounter (Signed)
Gave patient avs report and appointment for August. Patient declined SCP visit (per 1/27 pof) at this time and will call to schedule when ready.

## 2015-09-27 NOTE — Progress Notes (Signed)
Patient Care Team: [name], MD as PCP - General (Family Medicine) [name], MD as Attending Physician (Physical Medicine and Rehabilitation) [name], MD as Consulting Physician (Orthopedic Surgery) [name], MD as Consulting Physician (Otolaryngology) [name], MD as Consulting Physician (Gastroenterology)  DIAGNOSIS: Breast cancer of upper-outer quadrant of right female breast [facility])   Staging form: Breast, AJCC 7th Edition     Pathologic stage from 07/10/2015: Stage IA (T1c, N0, cM0) - Unsigned       Staging comments: Staged on surgical specimen by Dr. Lyndon Code      Clinical: No stage assigned - Unsigned   SUMMARY OF ONCOLOGIC HISTORY:   Breast cancer of upper-outer quadrant of right female breast ([place])   04/16/2000 Surgery Left breast: Triple negative  invasive ductal cancer treated with lumpectomy, adjuvant chemotherapy, radiation , in [place], unknown stage   06/07/2015 Mammogram Right breast mass 6x 6 x 5 mm, right axillary lymph node with slight cortex thickening measured 5 mm  T1b N0 stage IA clinical stage   06/13/2015 Initial Diagnosis Right breast needle biopsy: Invasive ductal carcinoma, grade 1, right axillary lymph node biopsy negative , ER 95%, PR 5%, Ki-67 10%, HER-2 negative   07/07/2015 Surgery Right lumpectomy: Invasive ductal carcinoma grade 1, 1 cm span, with low-grade DCIS, DCIS focally 0.1 cm to inferior margin, 0/3 lymph nodes negative, ER 95%, PR 5%, HER-2 negative ratio 1.1, Ki-67 10%, T1 cN0 stage IA   08/24/2015 - 09/21/2015 Radiation Therapy Adjuvant Radiation    CHIEF COMPLIANT: Follow-up after radiation therapy  INTERVAL HISTORY: Tracy [name] is a 70 year old with above-mentioned history of right breast cancer who underwent lumpectomy and adjuvant radiation. She is here to discuss different adjuvant treatment options. She is still very sore from the effect of radiation but appears to be healing quite well.  REVIEW OF SYSTEMS:     Constitutional: Denies fevers, chills or abnormal weight loss Eyes: Denies blurriness of vision Ears, nose, mouth, throat, and face: Denies mucositis or sore throat Respiratory: Denies cough, dyspnea or wheezes Cardiovascular: Denies palpitation, chest discomfort Gastrointestinal:  Denies nausea, heartburn or change in bowel habits Skin: Denies abnormal skin rashes Lymphatics: Denies new lymphadenopathy or easy bruising Neurological:Denies numbness, tingling or new weaknesses Behavioral/Psych: Mood is stable, no new changes  Extremities: No lower extremity edema Breast: Recent radiation All other systems were reviewed with the patient and are negative.  I have reviewed the past medical history, past surgical history, social history and family history with the patient and they are unchanged from previous note.  ALLERGIES:  is allergic to bacitracin-neomycin-polymyxin ; nsaids; tape; ambien; amoxicillin; contrast media; latex; and prednisone.  MEDICATIONS:  Current Outpatient Prescriptions  Medication Sig Dispense Refill  . albuterol (PROAIR HFA) 108 (90 BASE) MCG/ACT inhaler Inhale 2 puffs into the lungs every 4 (four) hours as needed for wheezing. 1 Inhaler 6  . aspirin 81 MG tablet Take 81 mg by mouth daily.    Marland Kitchen atorvastatin (LIPITOR) 10 MG tablet TAKE 1 TABLET BY MOUTH DAILY 30 tablet 6  . calcium carbonate (OS-CAL) 600 MG TABS tablet Take 1,200 mg by mouth daily with breakfast.     . cyclobenzaprine (FLEXERIL) 10 MG tablet Take 1 tablet (10 mg total) by mouth 3 (three) times daily as needed for muscle spasms. 45 tablet 0  . fluticasone (FLONASE) 50 MCG/ACT nasal spray PLACE 2 SPRAYS INTO BOTH NOSTRILS DAILY. 48 g 3  . HYDROcodone-acetaminophen (NORCO/VICODIN) 5-325 MG tablet Take 1 tablet  by mouth every 6 (six) hours as needed for moderate pain. 60 tablet 0  . loratadine (CLARITIN) 10 MG tablet Take 10 mg by mouth daily.    . meclizine (ANTIVERT) 25 MG tablet Take 1 tablet (25 mg  total) by mouth 3 (three) times daily as needed for dizziness. 30 tablet 1  . MELATONIN PO Take 1 tablet by mouth at bedtime.    Marland Kitchen omeprazole (PRILOSEC) 40 MG capsule TAKE 1 CAPSULE BY MOUTH TWICE DAILY 60 capsule 6  . traMADol (ULTRAM) 50 MG tablet Take 50 mg by mouth every 6 (six) hours as needed for moderate pain. For arthritis  0  . traZODone (DESYREL) 100 MG tablet TAKE 1 TABLET BY MOUTH EVERY NIGHT AT BEDTIME 90 tablet 1  . valsartan (DIOVAN) 160 MG tablet TAKE 1 TABLET BY MOUTH DAILY 30 tablet 6  . Vitamin D, Cholecalciferol, 400 UNITS CAPS Take 2 capsules by mouth daily.     No current facility-administered medications for this visit.    PHYSICAL EXAMINATION: ECOG PERFORMANCE STATUS: 1 - Symptomatic but completely ambulatory  Filed Vitals:   09/27/15 1355  BP: 181/84  Pulse: 90  Temp: 98.5 F (36.9 C)  Resp: 18   Filed Weights   09/27/15 1355  Weight: 196 lb 3.2 oz (88.996 kg)    GENERAL:alert, no distress and comfortable SKIN: skin color, texture, turgor are normal, no rashes or significant lesions EYES: normal, Conjunctiva are pink and non-injected, sclera clear OROPHARYNX:no exudate, no erythema and lips, buccal mucosa, and tongue normal  NECK: supple, thyroid normal size, non-tender, without nodularity LYMPH:  no palpable lymphadenopathy in the cervical, axillary or inguinal LUNGS: clear to auscultation and percussion with normal breathing effort HEART: regular rate & rhythm and no murmurs and no lower extremity edema ABDOMEN:abdomen soft, non-tender and normal bowel sounds MUSCULOSKELETAL:no cyanosis of digits and no clubbing  NEURO: alert & oriented x 3 with fluent speech, no focal motor/sensory deficits EXTREMITIES: No lower extremity edema   LABORATORY DATA:  I have reviewed the data as listed   Chemistry      Component Value Date/Time   NA 140 09/14/2015 0417   NA 139 07/13/2015 1516   K 4.0 09/14/2015 0417   K 3.9 07/13/2015 1516   CL 106 09/14/2015  0417   CO2 20* 09/14/2015 0417   CO2 25 07/13/2015 1516   BUN 7 09/14/2015 0417   BUN 7.9 07/13/2015 1516   CREATININE 0.97 09/14/2015 0417   CREATININE 0.9 07/13/2015 1516   CREATININE 0.95 05/10/2014 1147      Component Value Date/Time   CALCIUM 9.0 09/14/2015 0417   CALCIUM 8.9 07/13/2015 1516   ALKPHOS 83 09/14/2015 0417   ALKPHOS 99 07/13/2015 1516   AST 25 09/14/2015 0417   AST 16 07/13/2015 1516   ALT 15 09/14/2015 0417   ALT 12 07/13/2015 1516   BILITOT 0.2* 09/14/2015 0417   BILITOT <0.30 07/13/2015 1516       Lab Results  Component Value Date   WBC 6.3 09/14/2015   HGB 8.9* 09/14/2015   HCT 28.8* 09/14/2015   MCV 83.2 09/14/2015   PLT 334 09/14/2015   NEUTROABS 4.1 07/13/2015     ASSESSMENT & PLAN:  Breast cancer of upper-outer quadrant of right female breast ([place]) Right lumpectomy 07/07/2015: Invasive ductal carcinoma grade 1, 1 cm span, with low-grade DCIS, DCIS focally 0.1 cm to inferior margin, 0/3 lymph nodes negative, ER 95%, PR 5%, HER-2 negative ratio 1.1, Ki-67 10%, T1 cN0  stage IA ( Pior history of triple negative left breast cancer treated with lumpectomy, adjuvant chemotherapy, radiation , in [place], unknown stage 04/16/2000)  Completed adjuvant XRT  Treatment plan: Adj Anti estrogen therapy I discussed the pros and cons of anastrozole therapy. Patient is not very keen on taking any anti-estrogens because she is worried about the risk of osteoporosis. I reviewed that her bone mineral density done in 2014 showed a T score of -1.2. Very mild osteopenia. I felt that she should consider taking the antiestrogen treatment.  Patient is not keen on taking it and might decide against the antiestrogen treatment. I will see her back in 6 months for follow-up or sooner if she decides to take the antiestrogen treatment. We would be happy to call that into her pharmacy.   No orders of the defined types were placed in this encounter.   The patient has  a good understanding of the overall plan. she agrees with it. she will call with any problems that may develop before the next visit here.   Rulon Eisenmenger, MD 09/27/2015

## 2015-09-28 ENCOUNTER — Ambulatory Visit (INDEPENDENT_AMBULATORY_CARE_PROVIDER_SITE_OTHER): Payer: Medicare Other | Admitting: Family Medicine

## 2015-09-28 ENCOUNTER — Encounter: Payer: Self-pay | Admitting: Family Medicine

## 2015-09-28 VITALS — BP 122/72 | HR 84 | Temp 98.1°F | Resp 16 | Ht 60.0 in | Wt 196.1 lb

## 2015-09-28 DIAGNOSIS — D630 Anemia in neoplastic disease: Secondary | ICD-10-CM | POA: Diagnosis not present

## 2015-09-28 DIAGNOSIS — C50411 Malignant neoplasm of upper-outer quadrant of right female breast: Secondary | ICD-10-CM | POA: Diagnosis not present

## 2015-09-28 DIAGNOSIS — D638 Anemia in other chronic diseases classified elsewhere: Secondary | ICD-10-CM | POA: Insufficient documentation

## 2015-09-28 DIAGNOSIS — E119 Type 2 diabetes mellitus without complications: Secondary | ICD-10-CM | POA: Diagnosis not present

## 2015-09-28 DIAGNOSIS — R0789 Other chest pain: Secondary | ICD-10-CM

## 2015-09-28 LAB — CBC WITH DIFFERENTIAL/PLATELET
BASOS ABS: 0 10*3/uL (ref 0.0–0.1)
Basophils Relative: 0.4 % (ref 0.0–3.0)
EOS ABS: 0.1 10*3/uL (ref 0.0–0.7)
Eosinophils Relative: 1.2 % (ref 0.0–5.0)
HEMATOCRIT: 29.3 % — AB (ref 36.0–46.0)
Hemoglobin: 9.4 g/dL — ABNORMAL LOW (ref 12.0–15.0)
LYMPHS PCT: 29.7 % (ref 12.0–46.0)
Lymphs Abs: 1.8 10*3/uL (ref 0.7–4.0)
MCHC: 32 g/dL (ref 30.0–36.0)
MCV: 83.2 fl (ref 78.0–100.0)
MONOS PCT: 5.4 % (ref 3.0–12.0)
Monocytes Absolute: 0.3 10*3/uL (ref 0.1–1.0)
Neutro Abs: 3.9 10*3/uL (ref 1.4–7.7)
Neutrophils Relative %: 63.3 % (ref 43.0–77.0)
Platelets: 330 10*3/uL (ref 150.0–400.0)
RBC: 3.53 Mil/uL — AB (ref 3.87–5.11)
RDW: 16.4 % — ABNORMAL HIGH (ref 11.5–15.5)
WBC: 6.1 10*3/uL (ref 4.0–10.5)

## 2015-09-28 LAB — HEPATIC FUNCTION PANEL
ALBUMIN: 4.1 g/dL (ref 3.5–5.2)
ALT: 12 U/L (ref 0–35)
AST: 16 U/L (ref 0–37)
Alkaline Phosphatase: 84 U/L (ref 39–117)
Bilirubin, Direct: 0.1 mg/dL (ref 0.0–0.3)
TOTAL PROTEIN: 7.7 g/dL (ref 6.0–8.3)
Total Bilirubin: 0.3 mg/dL (ref 0.2–1.2)

## 2015-09-28 LAB — BASIC METABOLIC PANEL
BUN: 9 mg/dL (ref 6–23)
CALCIUM: 9.2 mg/dL (ref 8.4–10.5)
CO2: 26 meq/L (ref 19–32)
CREATININE: 0.96 mg/dL (ref 0.40–1.20)
Chloride: 103 mEq/L (ref 96–112)
GFR: 73.58 mL/min (ref >60.00)
GLUCOSE: 109 mg/dL — AB (ref 70–99)
Potassium: 3.8 mEq/L (ref 3.5–5.1)
Sodium: 139 mEq/L (ref 135–145)

## 2015-09-28 LAB — HEMOGLOBIN A1C: HEMOGLOBIN A1C: 7.2 % — AB (ref 4.6–6.5)

## 2015-09-28 NOTE — Progress Notes (Signed)
Pre visit review using our clinic review tool, if applicable. No additional management support is needed unless otherwise documented below in the visit note. 

## 2015-09-28 NOTE — Assessment & Plan Note (Signed)
Noted during recent hospitalization.  Care team suspected that this was due to her breast cancer.  Recommended repeat CBC.  Will follow.

## 2015-09-28 NOTE — Patient Instructions (Signed)
Follow up as needed We'll notify you of your lab results and make any changes if needed Keep up the good work!  You look great! Call with any questions or concerns If you want to join [place] at the new [place] office, any scheduled appointments will automatically transfer and we will see you at [address] [place] [address] [place], [place], [place] [address] ([place]) [place] in there!  You're doing great!!!

## 2015-09-28 NOTE — Assessment & Plan Note (Signed)
Chronic problem, diet controlled.  Asymptomatic at this time.  UTD on foot exam, eye exam.  On ARB for renal protection.  Check labs.  Adjust meds prn

## 2015-09-28 NOTE — Progress Notes (Signed)
   Subjective:    Patient ID: Tracy [name], female    DOB: 1946/02/29, 70 y.o.   MRN: [id number]  HPI Hospital f/u- pt was admitted 2/15-16 w/ CP.  Had negative (-) nuclear stress, CT ang.  Pain was thought to be due to radiation from breast CA tx.  Radiation oncology told her pain should improve ~1 week after last tx.  Pt reports feeling much better than before.  Has had '1 or 2 episodes' since d/c but pain is 'more of a dull ache'.  No SOB, nausea, sweating.  D/C summary recommended repeat CBC/CMP.  DM- pt is due for A1C due to missing last appt for radiation tx.  UTD on foot exam, eye exam.  On ARB for renal protection.   Review of Systems For ROS see HPI     Objective:   Physical Exam  Constitutional: She is oriented to person, place, and time. She appears well-developed and well-nourished. No distress.  HENT:  Head: Normocephalic and atraumatic.  Eyes: Conjunctivae and EOM are normal. Pupils are equal, round, and reactive to light.  Neck: Normal range of motion. Neck supple. No thyromegaly present.  Cardiovascular: Normal rate, regular rhythm, normal heart sounds and intact distal pulses.   No murmur heard. Pulmonary/Chest: Effort normal and breath sounds normal. No respiratory distress.  Abdominal: Soft. She exhibits no distension. There is no tenderness.  Musculoskeletal: She exhibits no edema.  Lymphadenopathy:    She has no cervical adenopathy.  Neurological: She is alert and oriented to person, place, and time.  Skin: Skin is warm and dry.  Psychiatric: She has a normal mood and affect. Her behavior is normal.  Vitals reviewed.         Assessment & Plan:

## 2015-09-28 NOTE — Assessment & Plan Note (Signed)
Atypical.  Normal CT ang, normal stress.  Pain is suspected to be from chest wall radiation.  Pt to use pain meds prn.  Will continue to follow.

## 2015-10-01 ENCOUNTER — Other Ambulatory Visit: Payer: Self-pay | Admitting: Family Medicine

## 2015-10-02 NOTE — Telephone Encounter (Signed)
Medication filled to pharmacy as requested.   

## 2015-10-03 ENCOUNTER — Telehealth: Payer: Self-pay

## 2015-10-03 NOTE — Telephone Encounter (Signed)
Patient called and wanted Dr. [name] to know that she has decided not to take the anti estrogen therapy.

## 2015-10-10 NOTE — Progress Notes (Signed)
Name: KYLEIGHA STUMP   MRN: [id number]  Date:  09/12/15   DOB: 1946/02/13  Status:outpatient    DIAGNOSIS: Breast cancer of upper-outer quadrant of right female breast [facility])   Staging form: Breast, AJCC 7th Edition     Pathologic stage from 07/10/2015: Stage IA (T1c, N0, cM0) - Unsigned       Staging comments: Staged on surgical specimen by Dr. Lyndon Code      Clinical: No stage assigned - Unsigned   CONSENT VERIFIED: yes   SET UP: Patient is setup supine   IMMOBILIZATION:  The following immobilization was used:Custom Moldable Pillow, breast board.   NARRATIVE: Maurita A Lueth underwent complex simulation and treatment planning for her boost treatment today.  Her tumor volume was outlined on the planning CT scan. The depth of her cavity was felt to be appropriate for treatment with electrons    18  MeV electrons will be prescribed to the 100%  isodose line.   I personally oversaw and approved the construction of a unique block which will be used for beam modification purposes.  An isodose plan is requested.

## 2015-10-17 ENCOUNTER — Telehealth: Payer: Self-pay | Admitting: Family Medicine

## 2015-10-17 ENCOUNTER — Encounter: Payer: Self-pay | Admitting: Hematology and Oncology

## 2015-10-17 ENCOUNTER — Telehealth: Payer: Self-pay | Admitting: *Deleted

## 2015-10-17 NOTE — Telephone Encounter (Signed)
FYI "My daughter in another state has an FMLA form she needs Dr. [name] to complete.  She has been on her job a year and qualifies for this and would like to have this on file so if anything happens to me she can get here.  I was previously given a fax number to send to him directly,"  Ms. Signore has the main fax number [phone] and POD Fax number [phone].

## 2015-10-17 NOTE — Progress Notes (Signed)
form left in box

## 2015-10-17 NOTE — Telephone Encounter (Signed)
Pt asking if Dr. Birdie Riddle would fill out FMLA papers for her other daughter, Tracy [name], in the event that she gets sick again. She had her last treatment with oncology 09/21/15 and does not go back until August. Please advise.

## 2015-10-18 NOTE — Telephone Encounter (Signed)
Please advise 

## 2015-10-19 ENCOUNTER — Encounter: Payer: Self-pay | Admitting: Hematology and Oncology

## 2015-10-19 NOTE — Telephone Encounter (Signed)
Of course!  We will complete whatever paperwork she needs to attend her chemo appts

## 2015-10-19 NOTE — Progress Notes (Signed)
form left in box- left for dr. [name] to sign

## 2015-10-19 NOTE — Progress Notes (Signed)
form left in box- left for dr. [name] to sign-faxed [phone] and mailed to patient for their records- copy sent to medical records

## 2015-10-19 NOTE — Telephone Encounter (Signed)
Pt notified, we will wait on paperwork.

## 2015-10-20 ENCOUNTER — Encounter: Payer: Self-pay | Admitting: Nurse Practitioner

## 2015-10-20 DIAGNOSIS — C50411 Malignant neoplasm of upper-outer quadrant of right female breast: Secondary | ICD-10-CM

## 2015-10-20 NOTE — Progress Notes (Signed)
The Survivorship Care Plan was mailed to [name] as she reported not being able to come in to the Survivorship Clinic for an in-person visit at this time. A letter was mailed to her outlining the purpose of the content of the care plan, as well as encouraging her to reach out to me with any questions or concerns.  My business card was included in the correspondence to the patient as well.  A copy of the care plan was also routed/faxed/mailed to [name], MD, the patient's PCP.  I will not be placing any follow-up appointments to the Survivorship Clinic for Tracy [name], but I am happy to see her at any time in the future for any survivorship concerns that may arise. Thank you for allowing me to participate in her care!  Kenn File, [place] [phone]

## 2015-10-22 NOTE — Addendum Note (Signed)
Encounter addended by: Malena Edman, RN on: 10/22/2015  9:21 PM<BR>     Documentation filed: Inpatient Patient Education

## 2015-10-26 ENCOUNTER — Ambulatory Visit
Admission: RE | Admit: 2015-10-26 | Discharge: 2015-10-26 | Disposition: A | Discharge status: Home / Self Care | Payer: Medicare Other | Source: Ambulatory Visit | Attending: Radiation Oncology | Admitting: Radiation Oncology

## 2015-11-01 ENCOUNTER — Encounter: Payer: Self-pay | Admitting: Hematology and Oncology

## 2015-11-01 NOTE — Progress Notes (Signed)
Fax done 07/17/15 I sent to medical recrds

## 2015-11-01 NOTE — Progress Notes (Signed)
Mrs. Tracy [name] is here for a one month for follow up visit for right breast cancer Skin status:Right breast has slight tanning some tenderness around the nipple with redness. Lotion being used: Luberderm with coco shea butter Have you seen your medical oncologist? Date If not ,when is appointment 09-27-15 next appointment 03-27-16 [place] ER+,have started AI or Tamoxifen? If not, why? Anastrozole discussed 09-27-15 Dr. [name] She decided not to take the medication Discuss survivorship appointment:10-20-15 Chestine Spore unable to attend the session information mailed Offer referral reading material for Survivorship, Kathrine Haddock and [facility] given 09-19-15 09-19-15 Anticipated intervention by surgeon:10-18-15 Dr. Marlou Starks says she is doing fine :Pain:1/10 Right breast taking Tramadol for pain Arm movement:Able to move right arm without discomfort. Has lymphedema in the left arm Appetite:Altered some days.  Having stomach problems Energy level:Having fatigue very low energy level most days since her surgery.BP 158/90 mmHg  Pulse 98  Temp(Src) 98.1 F (36.7 C) (Oral)  Resp 16  Ht 5" (0.127 m)  Wt 200 lb 8 oz (90.946 kg)  BMI 5638.66 kg/m2  SpO2 100%

## 2015-11-02 ENCOUNTER — Ambulatory Visit
Admission: RE | Admit: 2015-11-02 | Discharge: 2015-11-02 | Disposition: A | Discharge status: Home / Self Care | Payer: Medicare Other | Source: Ambulatory Visit | Attending: Radiation Oncology | Admitting: Radiation Oncology

## 2015-11-02 VITALS — BP 158/90 | HR 98 | Temp 98.1°F | Resp 16 | Ht <= 58 in | Wt 200.5 lb

## 2015-11-02 DIAGNOSIS — C50411 Malignant neoplasm of upper-outer quadrant of right female breast: Secondary | ICD-10-CM

## 2015-11-02 NOTE — Progress Notes (Signed)
   Department of Radiation Oncology  Phone:  [phone] Fax:        [phone]   Name: Tracy [name] MRN: [id number]  DOB: May 06, 1946  Date: 11/02/2015  Follow Up Visit Note  Diagnosis: Breast cancer of upper-outer quadrant of right female breast [facility])   Staging form: Breast, AJCC 7th Edition     Clinical stage from 06/13/2015: Stage IA (T1b, N0, M0) - Unsigned     Pathologic stage from 07/10/2015: Stage IA (T1c, N0, cM0) - Unsigned       Staging comments: Staged on surgical specimen by Dr. Lyndon Code  Summary and Interval since last radiation: 6 weeks. Completed radiation 08/23/2015-09/21/2015 to the right breast with 42.5 Gy at 2.5 Gy for 17 fractions plus boost to the right breast with 7.5 Gy at 2.5 Gy for 3 fractions.  Interval History: Tracy [name] presents today for routine followup. She mentions that her breast is tender. She mentions that she is not able to lay on her side because of her tenderness. She reports she is using Lubriderm and coco shea butter. She discussed taking Anastrozole with Dr. [name] and decided that she does not want to take it because of her osteopenia. She mentions that her pain is rated as a 1/10 to her right breast. She is taking Tramadol to manage the pain. She is able to move her right arm without discomfort. She mentions that her appetite is altered some days. She reports that she is having stomach problems. She is having fatigue and has very low energy levels most days since her surgery.  Physical Exam:  Filed Vitals:   11/02/15 1402  BP: 158/90  Pulse: 98  Temp: 98.1 F (36.7 C)  TempSrc: Oral  Resp: 16  Height: 5" (0.127 m)  Weight: 200 lb 8 oz (90.946 kg)  SpO2: 100%  She has lymphedema in her left arm. She has breast edema of the right breast with no erythema or signs of infection.  IMPRESSION: Tracy [name] is a 70 y.o. female with Stage IA breast cancer resolving from the acute effects from treatment.  PLAN: She has a follow up appointment with Dr.  [name] 03/27/2016. We discussed physical therapy. At this time, she is not interested due to the cost. I mentioned that there is an ABC class and gave her information about it. We discussed that we could set her up with a financial counselor. She will continue to follow up with Dr. [name] and Dr. Marlou Starks and she will follow up with me on an as needed basis.  ------------------------------------------------  [name], MD    This document serves as a record of services personally performed by [name], MD. It was created on her behalf by  Lendon Collar, a trained medical scribe. The creation of this record is based on the scribe's personal observations and the provider's statements to them. This document has been checked and approved by the attending provider.

## 2015-11-03 ENCOUNTER — Telehealth: Payer: Self-pay

## 2015-11-03 NOTE — Telephone Encounter (Signed)
Tracy [name], daughter, called. Her number [phone]. She is faxing FMLA papers back to [place]. Two questions were not answered on the papers. She is requesting these 2 questions be filled out and to fax papers back to the [place] today. FMLA left it until the last minute to tell her the papers were not completely filled out.

## 2015-11-03 NOTE — Telephone Encounter (Signed)
Letter received from [facility] for pt daughter Tracy [name].  Denial of Leave of Absence request for incomplete/insufficient form.  Placed in [place].

## 2015-11-06 ENCOUNTER — Telehealth: Payer: Self-pay | Admitting: *Deleted

## 2015-11-06 ENCOUNTER — Encounter: Payer: Self-pay | Admitting: Hematology and Oncology

## 2015-11-06 NOTE — Telephone Encounter (Signed)
Call from patient's daughter Kalman Shan wanting to know if FMLA papers were faxed. Documentation from [place] that update was faxed today. Relayed this information to [facility], she verbalized understanding.

## 2015-11-06 NOTE — Progress Notes (Signed)
faxed update [phone]

## 2015-11-09 DIAGNOSIS — M533 Sacrococcygeal disorders, not elsewhere classified: Secondary | ICD-10-CM | POA: Diagnosis not present

## 2015-11-15 ENCOUNTER — Telehealth: Payer: Self-pay | Admitting: Medical

## 2015-11-15 NOTE — Telephone Encounter (Signed)
Pt had ct of chest after I ordered cxr that she never got. So will cancel chest xray order.

## 2015-11-27 DIAGNOSIS — M4806 Spinal stenosis, lumbar region: Secondary | ICD-10-CM | POA: Diagnosis not present

## 2015-12-01 DIAGNOSIS — M545 Low back pain: Secondary | ICD-10-CM | POA: Diagnosis not present

## 2015-12-12 DIAGNOSIS — M5416 Radiculopathy, lumbar region: Secondary | ICD-10-CM | POA: Diagnosis not present

## 2015-12-19 DIAGNOSIS — M5416 Radiculopathy, lumbar region: Secondary | ICD-10-CM | POA: Diagnosis not present

## 2016-01-09 ENCOUNTER — Telehealth: Payer: Self-pay | Admitting: *Deleted

## 2016-01-09 DIAGNOSIS — C50411 Malignant neoplasm of upper-outer quadrant of right female breast: Secondary | ICD-10-CM | POA: Diagnosis not present

## 2016-01-09 NOTE — Telephone Encounter (Signed)
Spoke with Tracy [name] I let her know I was [name] Dr. Unknown Jim nurse from the Radiation oncology Department at the [facility].   Dr. Pablo Ledger would like wanted to know if you would be interested in a PT referral at this time.  Tracy [name] stated that she could not afford the PT referral at this time.

## 2016-02-01 DIAGNOSIS — M5416 Radiculopathy, lumbar region: Secondary | ICD-10-CM | POA: Diagnosis not present

## 2016-02-12 DIAGNOSIS — E119 Type 2 diabetes mellitus without complications: Secondary | ICD-10-CM | POA: Diagnosis not present

## 2016-02-12 LAB — HM DIABETES EYE EXAM

## 2016-02-13 ENCOUNTER — Encounter: Payer: Self-pay | Admitting: General Practice

## 2016-02-22 DIAGNOSIS — M25552 Pain in left hip: Secondary | ICD-10-CM | POA: Diagnosis not present

## 2016-03-15 ENCOUNTER — Other Ambulatory Visit: Payer: Self-pay | Admitting: Family Medicine

## 2016-03-15 NOTE — Telephone Encounter (Signed)
Medication filled to pharmacy as requested.   

## 2016-03-18 NOTE — Telephone Encounter (Signed)
error 

## 2016-03-27 ENCOUNTER — Other Ambulatory Visit: Payer: Self-pay | Admitting: Hematology and Oncology

## 2016-03-27 ENCOUNTER — Telehealth: Payer: Self-pay | Admitting: Hematology and Oncology

## 2016-03-27 ENCOUNTER — Encounter: Payer: Self-pay | Admitting: Hematology and Oncology

## 2016-03-27 ENCOUNTER — Ambulatory Visit (HOSPITAL_BASED_OUTPATIENT_CLINIC_OR_DEPARTMENT_OTHER): Payer: Medicare Other | Admitting: Hematology and Oncology

## 2016-03-27 DIAGNOSIS — C50411 Malignant neoplasm of upper-outer quadrant of right female breast: Secondary | ICD-10-CM

## 2016-03-27 DIAGNOSIS — Z853 Personal history of malignant neoplasm of breast: Secondary | ICD-10-CM

## 2016-03-27 DIAGNOSIS — M8580 Other specified disorders of bone density and structure, unspecified site: Secondary | ICD-10-CM | POA: Diagnosis not present

## 2016-03-27 DIAGNOSIS — I890 Lymphedema, not elsewhere classified: Secondary | ICD-10-CM | POA: Diagnosis not present

## 2016-03-27 NOTE — Assessment & Plan Note (Signed)
Right lumpectomy 07/07/2015: Invasive ductal carcinoma grade 1, 1 cm span, with low-grade DCIS, DCIS focally 0.1 cm to inferior margin, 0/3 lymph nodes negative, ER 95%, PR 5%, HER-2 negative ratio 1.1, Ki-67 10%, T1 cN0 stage IA ( Pior history of triple negative left breast cancer treated with lumpectomy, adjuvant chemotherapy, radiation , in [place], unknown stage 04/16/2000)  Completed adjuvant XRT  Treatment plan: Patient decided against taking any anti-estrogens because she is worried about the risk of osteoporosis. I reviewed that her bone mineral density done in 2014 showed a T score of -1.2. Very mild osteopenia.  Return to clinic in one year for survivorship follow-up.

## 2016-03-27 NOTE — Telephone Encounter (Signed)
appt made and avs printed °

## 2016-03-27 NOTE — Progress Notes (Signed)
Patient Care Team: [name], MD as PCP - General (Family Medicine) [name], MD as Attending Physician (Physical Medicine and Rehabilitation) [name], MD as Consulting Physician (Orthopedic Surgery) [name], MD as Consulting Physician (Otolaryngology) [name], MD as Consulting Physician (Gastroenterology) Autumn Messing III, MD as Consulting Physician (General Surgery) [name], MD as Consulting Physician (Hematology and Oncology) [name], MD as Consulting Physician (Radiation Oncology) [name], NP as Nurse Practitioner (Hematology and Oncology)  DIAGNOSIS: Breast cancer of upper-outer quadrant of right female breast [facility])   Staging form: Breast, AJCC 7th Edition   - Clinical stage from 06/13/2015: Stage IA (T1b, N0, M0) - Unsigned   - Pathologic stage from 07/10/2015: Stage IA (T1c, N0, cM0) - Unsigned         Staging comments: Staged on surgical specimen by Dr. Lyndon Code  SUMMARY OF ONCOLOGIC HISTORY:   Breast cancer of upper-outer quadrant of right female breast ([place])   04/16/2000 Surgery    Left breast: Triple negative  invasive ductal cancer treated with lumpectomy, adjuvant chemotherapy, radiation , in [place], unknown stage      06/07/2015 Mammogram    Right breast mass 6x 6 x 5 mm, right axillary lymph node with slight cortex thickening measured 5 mm       06/13/2015 Initial Diagnosis    Right breast needle biopsy: Invasive ductal carcinoma, grade 1, right axillary lymph node biopsy negative , ER 95%, PR 5%, Ki-67 10%, HER-2 negative      06/13/2015 Clinical Stage    Stage IA: T1b N0      07/07/2015 Surgery    Right lumpectomy: Invasive ductal carcinoma grade 1, 1 cm span, with low-grade DCIS, DCIS focally 0.1 cm to inferior margin, 0/3 lymph nodes negative, ER 95%, PR 5%, HER-2 negative ratio 1.1, Ki-67 10%      07/07/2015 Pathologic Stage    Stage IA: T1c N0      07/13/2015 Procedure    Breast High/Moderate Risk  Panel reveals no clinically significant variant at ATM, BRCA1, BRCA2, CDH1, CHEK2, PALB2, PTEN, and TP53.       08/23/2015 - 09/21/2015 Radiation Therapy    Adjuvant Radiation: Right breast/ 42.5Gy at 2.5 Gy per fraction x 17 fractions.  Right breast boost/ 7.5 Gy at 2.5 Gy per fraction x 3 fractions       Anti-estrogen oral therapy    Patient refused antiestrogen therapy      10/20/2015 Survivorship    Survivorship care plan completed and mailed to patient in lieu of in person visit at her request       CHIEF COMPLIANT: Surveillance of right breast cancer  INTERVAL HISTORY: Tracy [name] is a 70 year old with above-mentioned history of right breast cancer treated with lumpectomy radiation and is currently on surveillance. She did not want to go on antiestrogen therapy. She complains of increased sensitivity and tenderness in the right breast. Especially when she lays on her right side. She feels slight nodularity in the right breast surgical scar.  REVIEW OF SYSTEMS:   Constitutional: Denies fevers, chills or abnormal weight loss Eyes: Denies blurriness of vision Ears, nose, mouth, throat, and face: Denies mucositis or sore throat Respiratory: Denies cough, dyspnea or wheezes Cardiovascular: Denies palpitation, chest discomfort Gastrointestinal:  Denies nausea, heartburn or change in bowel habits Skin: Denies abnormal skin rashes Lymphatics: Denies new lymphadenopathy or easy bruising Neurological:Denies numbness, tingling or new weaknesses Behavioral/Psych: Mood is stable, no new changes  Extremities: No lower extremity  edema Breast: Right breast sensitivity and tenderness and palpable nodularity All other systems were reviewed with the patient and are negative.  I have reviewed the past medical history, past surgical history, social history and family history with the patient and they are unchanged from previous note.  ALLERGIES:  is allergic to bacitracin-neomycin-polymyxin   [neomycin-bacitracin zn-polymyx]; nsaids; tape; ambien [zolpidem tartrate]; amoxicillin; contrast media [iodinated diagnostic agents]; latex; and prednisone.  MEDICATIONS:  Current Outpatient Prescriptions  Medication Sig Dispense Refill  . albuterol (PROAIR HFA) 108 (90 BASE) MCG/ACT inhaler Inhale 2 puffs into the lungs every 4 (four) hours as needed for wheezing. 1 Inhaler 6  . aspirin 81 MG tablet Take 81 mg by mouth daily.    Marland Kitchen atorvastatin (LIPITOR) 10 MG tablet TAKE 1 TABLET BY MOUTH DAILY 30 tablet 6  . calcium carbonate (OS-CAL) 600 MG TABS tablet Take 1,200 mg by mouth daily with breakfast.     . cyclobenzaprine (FLEXERIL) 10 MG tablet Take 1 tablet (10 mg total) by mouth 3 (three) times daily as needed for muscle spasms. (Patient not taking: Reported on 11/02/2015) 45 tablet 0  . fluticasone (FLONASE) 50 MCG/ACT nasal spray PLACE 2 SPRAYS INTO BOTH NOSTRILS DAILY. 48 g 3  . HYDROcodone-acetaminophen (NORCO/VICODIN) 5-325 MG tablet Take 1 tablet by mouth every 6 (six) hours as needed for moderate pain. 60 tablet 0  . loratadine (CLARITIN) 10 MG tablet Take 10 mg by mouth daily.    . meclizine (ANTIVERT) 25 MG tablet Take 1 tablet (25 mg total) by mouth 3 (three) times daily as needed for dizziness. 30 tablet 1  . MELATONIN PO Take 1 tablet by mouth at bedtime.    Marland Kitchen omeprazole (PRILOSEC) 40 MG capsule TAKE 1 CAPSULE BY MOUTH TWICE DAILY 60 capsule 6  . traMADol (ULTRAM) 50 MG tablet Take 50 mg by mouth every 6 (six) hours as needed for moderate pain. For arthritis  0  . traZODone (DESYREL) 100 MG tablet TAKE 1 TABLET BY MOUTH EVERY NIGHT AT BEDTIME 90 tablet 0  . valsartan (DIOVAN) 160 MG tablet TAKE 1 TABLET BY MOUTH DAILY 30 tablet 6  . Vitamin D, Cholecalciferol, 400 UNITS CAPS Take 2 capsules by mouth daily.     No current facility-administered medications for this visit.     PHYSICAL EXAMINATION: ECOG PERFORMANCE STATUS: 1 - Symptomatic but completely ambulatory  Vitals:    03/27/16 1447  BP: (!) 117/53  Pulse: 95  Resp: 19  Temp: 98.9 F (37.2 C)   Filed Weights   03/27/16 1447  Weight: 194 lb 9.6 oz (88.3 kg)    GENERAL:alert, no distress and comfortable SKIN: skin color, texture, turgor are normal, no rashes or significant lesions EYES: normal, Conjunctiva are pink and non-injected, sclera clear OROPHARYNX:no exudate, no erythema and lips, buccal mucosa, and tongue normal  NECK: supple, thyroid normal size, non-tender, without nodularity LYMPH:  no palpable lymphadenopathy in the cervical, axillary or inguinal LUNGS: clear to auscultation and percussion with normal breathing effort HEART: regular rate & rhythm and no murmurs and no lower extremity edema ABDOMEN:abdomen soft, non-tender and normal bowel sounds MUSCULOSKELETAL:no cyanosis of digits and no clubbing  NEURO: alert & oriented x 3 with fluent speech, no focal motor/sensory deficits EXTREMITIES: No lower extremity edema Breast: Right breast lymphedema  LABORATORY DATA:  I have reviewed the data as listed   Chemistry      Component Value Date/Time   NA 139 09/28/2015 1138   NA 139 07/13/2015 1516  K 3.8 09/28/2015 1138   K 3.9 07/13/2015 1516   CL 103 09/28/2015 1138   CO2 26 09/28/2015 1138   CO2 25 07/13/2015 1516   BUN 9 09/28/2015 1138   BUN 7.9 07/13/2015 1516   CREATININE 0.96 09/28/2015 1138   CREATININE 0.9 07/13/2015 1516      Component Value Date/Time   CALCIUM 9.2 09/28/2015 1138   CALCIUM 8.9 07/13/2015 1516   ALKPHOS 84 09/28/2015 1138   ALKPHOS 99 07/13/2015 1516   AST 16 09/28/2015 1138   AST 16 07/13/2015 1516   ALT 12 09/28/2015 1138   ALT 12 07/13/2015 1516   BILITOT 0.3 09/28/2015 1138   BILITOT <0.30 07/13/2015 1516       Lab Results  Component Value Date   WBC 6.1 09/28/2015   HGB 9.4 (L) 09/28/2015   HCT 29.3 (L) 09/28/2015   MCV 83.2 09/28/2015   PLT 330.0 09/28/2015   NEUTROABS 3.9 09/28/2015   ASSESSMENT & PLAN:  Breast cancer of  upper-outer quadrant of right female breast ([place]) Right lumpectomy 07/07/2015: Invasive ductal carcinoma grade 1, 1 cm span, with low-grade DCIS, DCIS focally 0.1 cm to inferior margin, 0/3 lymph nodes negative, ER 95%, PR 5%, HER-2 negative ratio 1.1, Ki-67 10%, T1 cN0 stage IA ( [place] history of triple negative left breast cancer treated with lumpectomy, adjuvant chemotherapy, radiation , in [place], unknown stage 04/16/2000)  Completed adjuvant XRT  Treatment plan: Patient decided against taking any anti-estrogens because she is worried about the risk of osteoporosis. I reviewed that her bone mineral density done in 2014 showed a T score of -1.2. Very mild osteopenia.  Lymphedema the right breast: Patient tells me that she does not have the financial capability to go for physical therapy. I asked her physical therapist to drop in and check and with her and advised her on some techniques to reduce the breast lymphedema.  Surveillance: 1. Breast exam 03/27/2016: Right breast lymphedema I do not palpate any nodularity. 2. patient will get a mammogram in November 2017.  Return to clinic in one year for follow-up.   Orders Placed This Encounter  Procedures  . MM DIAG BREAST TOMO BILATERAL    Standing Status:   Future    Standing Expiration Date:   05/27/2017    Order Specific Question:   Reason for Exam (SYMPTOM  OR DIAGNOSIS REQUIRED)    Answer:   bilateral breast cancers    Order Specific Question:   Preferred imaging location?    Answer:   [facility]   The patient has a good understanding of the overall plan. she agrees with it. she will call with any problems that may develop before the next visit here.   Rulon Eisenmenger, MD 03/27/16

## 2016-04-03 ENCOUNTER — Ambulatory Visit: Payer: Medicare Other | Attending: Hematology and Oncology | Admitting: Physical Therapy

## 2016-04-03 DIAGNOSIS — M545 Low back pain, unspecified: Secondary | ICD-10-CM

## 2016-04-03 DIAGNOSIS — I890 Lymphedema, not elsewhere classified: Secondary | ICD-10-CM

## 2016-04-03 DIAGNOSIS — M25552 Pain in left hip: Secondary | ICD-10-CM | POA: Diagnosis not present

## 2016-04-03 NOTE — Therapy (Signed)
[place], [place], [address] Phone: [phone]   Fax:  [phone]  Physical Therapy Evaluation  Patient Details  Name: Tracy [name] MRN: [id number] Date of Birth: Jul 01, 1946 Referring Provider: Dr. [name]  Encounter Date: 04/03/2016      PT End of Session - 04/03/16 1503    Visit Number 1   Number of Visits 5   Date for PT Re-Evaluation 05/03/16   PT Start Time [id number]   PT Stop Time 1455   PT Time Calculation (min) 58 min   Activity Tolerance Patient tolerated treatment well   Behavior During Therapy [facility] for tasks assessed/performed      Past Medical History:  Diagnosis Date  . Anxiety   . Arthritis   . Breast cancer ([place]) 06/13/15  . Cancer ([place]) 2000   breast cancer  . Chronic bronchitis ([place])   . Chronic bronchitis ([place])   . Hyperlipidemia   . Hypertension   . Myocardial infarction ([place]) 2001  . Restless leg   . Stroke [facility]) 2004   TIA, no deficits    Past Surgical History:  Procedure Laterality Date  . ABDOMINAL HYSTERECTOMY  1985  . BREAST SURGERY  2001   lt breast lumpectomy  . RADIOACTIVE SEED GUIDED MASTECTOMY WITH AXILLARY SENTINEL LYMPH NODE BIOPSY Right 07/07/2015   Procedure: RIGHT RADIOACTIVE SEED GUIDED PARTIAL MASTECTOMY WITH AXILLARY SENTINEL LYMPH NODE BIOPSY;  Surgeon: Autumn Messing III, MD;  Location: [facility];  Service: General;  Laterality: Right;  . SMALL INTESTINE SURGERY    . TUBAL LIGATION      There were no vitals filed for this visit.       Subjective Assessment - 04/03/16 1401    Subjective Dr. [name] referred me here because I was having really bad heaviness in my right breast, and sensitivity, and thickness in the skin."  The left arm never goes down; wears a compression sleeve and glove sometimes, and feels her arm is stable.  Later she says the left arm goes down with elevation.   Pertinent History Has been seen here before,  but she reports it was brief.  Right breast cancer diagnosed October 2016 with lumpectomy, lymph node dissection (not sure how many), and radiation. Left breast cancer in 2001 with surgery ("lymphectomy," per patient, and she is unsure how many nodes were removed), chemo, and radiation.  HTN controlled with meds; hyperlipidemia.  A1C is being watched.  Arthritis in hip, knees, SI joint.  Was told 20 years ago she has left shoulder bursitis and that her shoulder became locked, but she isn't sure.   Patient Stated Goals wants to get a compression bra   Currently in Pain? Yes   Pain Score 3    Pain Location Other (Comment)  hip and SI joint   Pain Orientation Left   Pain Descriptors / Indicators Aching   Aggravating Factors  sitting in certain positions   Pain Relieving Factors changing position   Effect of Pain on Daily Activities can't sleep on the right side            [facility] PT Assessment - 04/03/16 0001      Assessment   Medical Diagnosis right breast cancer, h/o left breast cancer   Referring Provider Dr. [name]   Onset Date/Surgical Date 04/29/15  approx.   Hand Dominance Right     Precautions   Precautions Other (comment)   Precaution Comments cancer precautions  Restrictions   Weight Bearing Restrictions No     Balance Screen   Has the patient fallen in the past 6 months Yes  doesn't know what happened; orthostatic?   How many times? 1   Has the patient had a decrease in activity level because of a fear of falling?  No   Is the patient reluctant to leave their home because of a fear of falling?  No     Home Environment   Living Environment Private residence   Living Arrangements Alone   Type of [place] to enter   Entrance Stairs-Number of Steps 6   Entrance Stairs-Rails --  on the four steps inside the building     Prior Function   Level of [place] Retired   Leisure no exercise     Cognition    Overall Cognitive Status Within Functional Limits for tasks assessed     Observation/Other Assessments   Observations right breast appears approximately 50% larger than left; left has indentation where she had surgery in 2001   Other Surveys  --  lymphedema life impact scale score 45 = 66% impairment     ROM / Strength   AROM / PROM / Strength AROM;Strength     AROM   Overall AROM Comments both legs grossly WFL except left hip abduction limited approx. 50% compared to right and left hip external rotation approx. 50% limited compared to right   AROM Assessment Site Shoulder   Right/Left Shoulder Right;Left   Right Shoulder Flexion 145 Degrees   Right Shoulder ABduction 154 Degrees   Right Shoulder Internal Rotation --  slightly limited on right   Right Shoulder External Rotation --  limited on left compared to right   Left Shoulder Flexion 124 Degrees   Left Shoulder ABduction 104 Degrees   Left Shoulder Internal Rotation --  slightly limited on right     Strength   Strength Assessment Site Hip   Right/Left Hip Right;Left   Right Hip Flexion 5/5   Right Hip Extension --  good to normal resistance in sitting   Right Hip External Rotation  4/5   Right Hip Internal Rotation 5/5   Right Hip ABduction 5/5   Left Hip Flexion 4/5  discomfort   Left Hip Extension --  fair resistance in sitting   Left Hip External Rotation 3-/5   Left Hip Internal Rotation --  gave way to pain   Left Hip ABduction 3+/5     Ambulation/Gait   Ambulation/Gait Yes   Ambulation/Gait Assistance 6: Modified independent (Device/Increase time)   Stairs Yes   Stair Management Technique Step to pattern  per patient report; not assessed today   Gait Comments just slight asymmetry in gait, perhaps due to increased stiffness left hip           LYMPHEDEMA/ONCOLOGY QUESTIONNAIRE - 04/03/16 1426      What other symptoms do you have   Are you Having Heaviness or Tightness Yes  in right breast   Are  you having pitting edema Yes  mild   Body Site left forearm   Other Symptoms right breast tissue slightly firmer on palpation at upper breast than left     Lymphedema Assessments   Lymphedema Assessments Upper extremities     Right Upper Extremity Lymphedema   10 cm Proximal to Olecranon Process 38 cm   Olecranon Process 27.3 cm   10 cm Proximal to Ulnar Styloid  Process 26.6 cm   Just Proximal to Ulnar Styloid Process 17.8 cm   Across Hand at [employer] 20.1 cm   At [place] of 2nd Digit 6.1 cm     Left Upper Extremity Lymphedema   10 cm Proximal to Olecranon Process 38.3 cm   Olecranon Process 31.4 cm   10 cm Proximal to Ulnar Styloid Process 31.6 cm   Just Proximal to Ulnar Styloid Process 22.7 cm   Across Hand at [employer] 20.3 cm   At [place] of 2nd Digit 6.5 cm   Other right breast appears approx. 50% larger than left                                Long Term Clinic Goals - 04/03/16 1526      CC Long Term Goal  #1   Title patient will be knowledgeable about self-manual lymph drainage for right breast   Status New     CC Long Term Goal  #2   Title patient will be knowledgeable about compression bras, where and how to obtain one   Status New     CC Long Term Goal  #3   Title Pt. will be independent with home exercise program for left shoulder ROM and strengthening.   Status New     CC Long Term Goal  #4   Title patient will be knowledgeable about home exercise program for left hip ROM and strengthening   Status New            Plan - 04/03/16 1513    Clinical Impression Statement Patient with a h/o left breast cancer 2001 and left UE lymphedema that she feels is stable, with more recent right breast cancer diagnosis and treatment in 2016 and now with c/o right breast fullness.  She also has left hip and SI joint pain with a mild gait deviation and left leg weakness.  She has limited left shoulder AROM, and she reports a remote h/o left  shoulder bursitis and not doing the exercises she was taught to explain this.  She has a left compression sleeve and glove that are fairly new but does not have a compression bra.  She does not feel she can afford to come to many therapy visits.   Rehab Potential Good   Clinical Impairments Affecting Rehab Potential treatment for breast cancer bilat.   PT Frequency 1x / week   PT Duration 4 weeks   PT Treatment/Interventions ADLs/Self Care Home Management;Electrical Stimulation;Moist Heat;Cryotherapy;Gait training;Stair training;Therapeutic exercise;Patient/family education;Orthotic Fit/Training;Manual techniques;Manual lymph drainage;Passive range of motion   PT Next Visit Plan begin right breast manual lymph drainage (directing toward right groin) and instruct patient in same; check on whether she has heard from [facility] re: bra fitting.  Later, instruct in HEP for left hip ROM and strengthening and possible left shoulder ROM and strengthening.   Recommended Other Services faxed demographics to [facility] for compression bra fitting   Consulted and Agree with Plan of Care Patient      Patient will benefit from skilled therapeutic intervention in order to improve the following deficits and impairments:  Abnormal gait, Decreased knowledge of precautions, Decreased knowledge of use of DME, Decreased range of motion, Decreased strength, Increased edema, Impaired UE functional use, Pain  Visit Diagnosis: Lymphedema, not elsewhere classified - Plan: PT plan of care cert/re-cert  Pain in left hip - Plan: PT plan of  care cert/re-cert  Midline low back pain without sciatica - Plan: PT plan of care cert/re-cert      G-Codes - [id number]    Functional Assessment Tool Used lymphedema life impact scale   Functional Limitation Self care   Self Care Current Status [id number]) At least 60 percent but less than 80 percent impaired, limited or restricted   Self Care Goal Status [id number]) At least 20  percent but less than 40 percent impaired, limited or restricted       Problem List Patient Active Problem List   Diagnosis Date Noted  . Anemia of chronic disease 09/28/2015  . Genetic testing 08/21/2015  . History of left breast cancer 07/13/2015  . Breast cancer of upper-outer quadrant of right female breast ([place]) 06/20/2015  . Maxillary sinusitis 05/15/2015  . Nausea without vomiting 05/15/2015  . Boil 03/16/2015  . Bee sting 03/02/2015  . Hyperlipidemia associated with type 2 diabetes mellitus ([place]) 12/29/2014  . Hearing loss due to cerumen impaction 12/29/2014  . Atypical chest pain 09/05/2014  . Abdominal pain, epigastric 08/25/2014  . Diabetes mellitus type II, controlled ([place]) 05/19/2014  . Allergy to adhesive tape 05/19/2014  . Other and unspecified hyperlipidemia 12/06/2013  . GERD (gastroesophageal reflux disease) 12/06/2013  . Severe headache 11/11/2013  . Cervical disc disease 11/11/2013  . Osteopenia 05/25/2013  . Nausea alone 05/10/2013  . Allergic asthma 12/24/2012  . Insomnia 04/02/2012  . Physical exam, annual 04/02/2012  . Cramps, muscle, general 02/25/2012  . Chest pain 02/25/2012  . Sinusitis acute 02/19/2012  . HTN (hypertension) 02/03/2012  . Vertigo, benign positional 02/03/2012  . Left groin pain 02/03/2012    Tracy [name] 04/03/2016, 3:37 PM  [place], [place], [address] Phone: [phone]   Fax:  [phone]  Name: Tracy [name] MRN: [id number] Date of Birth: 07/28/1946  Serafina Royals, PT 04/03/16 3:37 PM

## 2016-04-08 ENCOUNTER — Other Ambulatory Visit: Payer: Self-pay | Admitting: Family Medicine

## 2016-04-08 MED ORDER — ALBUTEROL SULFATE HFA 108 (90 BASE) MCG/ACT IN AERS: 2.0000 | INHALATION_SPRAY | RESPIRATORY_TRACT | 6 refills | Status: DC | PRN

## 2016-04-10 ENCOUNTER — Ambulatory Visit: Payer: Medicare Other | Admitting: Physical Therapy

## 2016-04-10 DIAGNOSIS — M25552 Pain in left hip: Secondary | ICD-10-CM | POA: Diagnosis not present

## 2016-04-10 DIAGNOSIS — I890 Lymphedema, not elsewhere classified: Secondary | ICD-10-CM

## 2016-04-10 DIAGNOSIS — M545 Low back pain: Secondary | ICD-10-CM | POA: Diagnosis not present

## 2016-04-10 NOTE — Therapy (Signed)
[facility] [address] [place], [place], [address] Phone: [phone]   Fax:  [phone]  Physical Therapy Treatment  Patient Details  Name: Tracy [name] MRN: [id number] Date of Birth: 12/17/1945 Referring Provider: Dr. Vinay Gudena  Encounter Date: 04/10/2016      PT End of Session - 04/10/16 1643    Visit Number 2   Number of Visits 5   Date for PT Re-Evaluation 05/03/16   PT Start Time 1304   PT Stop Time 1358   PT Time Calculation (min) 54 min   Activity Tolerance Patient tolerated treatment well   Behavior During Therapy WFL for tasks assessed/performed      Past Medical History:  Diagnosis Date  . Anxiety   . Arthritis   . Breast cancer (HCC) 06/13/15  . Cancer (HCC) 2000   breast cancer  . Chronic bronchitis (HCC)   . Chronic bronchitis (HCC)   . Hyperlipidemia   . Hypertension   . Myocardial infarction (HCC) 2001  . Restless leg   . Stroke (HCC) 2004   TIA, no deficits    Past Surgical History:  Procedure Laterality Date  . ABDOMINAL HYSTERECTOMY  1985  . BREAST SURGERY  2001   lt breast lumpectomy  . RADIOACTIVE SEED GUIDED MASTECTOMY WITH AXILLARY SENTINEL LYMPH NODE BIOPSY Right 07/07/2015   Procedure: RIGHT RADIOACTIVE SEED GUIDED PARTIAL MASTECTOMY WITH AXILLARY SENTINEL LYMPH NODE BIOPSY;  Surgeon: Paul Toth III, MD;  Location: [facility];  Service: General;  Laterality: Right;  . SMALL INTESTINE SURGERY    . TUBAL LIGATION      There were no vitals filed for this visit.      Subjective Assessment - 04/10/16 1307    Subjective Hip was hurting the other day but it felt better when I put on my crocs, then it got worse when I put my slippers back on.  Bras are too expensive from [facility] for her to buy one.   Currently in Pain? Yes   Pain Score 8    Pain Location Hip   Pain Orientation Left   Aggravating Factors  different shoes, putting weight on that hip   Pain  Relieving Factors different shoes, leaning away from the hip   Multiple Pain Sites Yes   Pain Score 5   Pain Location Breast   Pain Orientation Right   Pain Descriptors / Indicators Heaviness   Aggravating Factors  lying on right side                         [facility] Adult PT Treatment/Exercise - 04/10/16 0001      Self-Care   Self-Care Other Self-Care Comments   Other Self-Care Comments  Instructed patient is qualities of a bra that would provide compression, such as a sports bra that has a longer line down the side, would come high up toward the axilla, and compress the right breast to some extent.  Educated patient in principles of manual lymph drainage for right breast.  Cut 1/4 inch gray foam and small dot peach Medi foam in rectangle and instrcuted patient in placing this in her bra to add compression, as well as results to look for.     Manual Therapy   Manual Therapy Edema management;Manual Lymphatic Drainage (MLD)   Manual Lymphatic Drainage (MLD) In supine, short neck, diaphragmatic breathing with resistance x 5, right groin and axillo-inguinal anastomosis, and right breast, directing toward   right axillo-inguinal anastomosis.                PT Education - 04/10/16 1642    Education provided Yes   Education Details began education in self-manual lymph drainage; educated in use of foam pad with peach Medi small dot foam in bra; about compression bras   Person(s) Educated Patient   Methods Explanation;Handout;Demonstration  handout on self-manual lymph drainage and loaned patient DVD on same topic   Comprehension Verbalized understanding;Need further instruction                Long Term Clinic Goals - 04/10/16 1646      CC Long Term Goal  #1   Title patient will be knowledgeable about self-manual lymph drainage for right breast   Status Partially Met     CC Long Term Goal  #2   Title patient will be knowledgeable about compression bras, where  and how to obtain one   Status Partially Met            Plan - 04/10/16 1644    Clinical Impression Statement Patient just beginning treatment.  She will need to have hands-on instruction in self-manual lymph drainage, as she did not do this today.  She reports that she heard from [facility] but that she cannot afford compression bras from them, so we discussed less expensive alternatives.   Rehab Potential Good   Clinical Impairments Affecting Rehab Potential treatment for breast cancer bilat.   PT Frequency 1x / week   PT Duration 4 weeks   PT Treatment/Interventions ADLs/Self Care Home Management;Manual techniques;Manual lymph drainage;Patient/family education   PT Next Visit Plan Have patient perform self-manual lymph drainage with instruction; check on whether she has found compression bras that are affordable; assess whether two types of foam in bra helped.  later, instruct in HEP for left hip ROM and strengthening.   Consulted and Agree with Plan of Care Patient      Patient will benefit from skilled therapeutic intervention in order to improve the following deficits and impairments:  Abnormal gait, Decreased knowledge of precautions, Decreased knowledge of use of DME, Decreased range of motion, Decreased strength, Increased edema, Impaired UE functional use, Pain  Visit Diagnosis: Lymphedema, not elsewhere classified     Problem List Patient Active Problem List   Diagnosis Date Noted  . Anemia of chronic disease 09/28/2015  . Genetic testing 08/21/2015  . History of left breast cancer 07/13/2015  . Breast cancer of upper-outer quadrant of right female breast (HCC) 06/20/2015  . Maxillary sinusitis 05/15/2015  . Nausea without vomiting 05/15/2015  . Boil 03/16/2015  . Bee sting 03/02/2015  . Hyperlipidemia associated with type 2 diabetes mellitus (HCC) 12/29/2014  . Hearing loss due to cerumen impaction 12/29/2014  . Atypical chest pain 09/05/2014  . Abdominal pain,  epigastric 08/25/2014  . Diabetes mellitus type II, controlled (HCC) 05/19/2014  . Allergy to adhesive tape 05/19/2014  . Other and unspecified hyperlipidemia 12/06/2013  . GERD (gastroesophageal reflux disease) 12/06/2013  . Severe headache 11/11/2013  . Cervical disc disease 11/11/2013  . Osteopenia 05/25/2013  . Nausea alone 05/10/2013  . Allergic asthma 12/24/2012  . Insomnia 04/02/2012  . Physical exam, annual 04/02/2012  . Cramps, muscle, general 02/25/2012  . Chest pain 02/25/2012  . Sinusitis acute 02/19/2012  . HTN (hypertension) 02/03/2012  . Vertigo, benign positional 02/03/2012  . Left groin pain 02/03/2012    SALISBURY,DONNA 04/10/2016, 4:47 PM  [facility]   [place], [place], [address] Phone: [phone]   Fax:  [phone]  Name: ZILLAH ALEXIE MRN: [id number] Date of Birth: 1946/07/28  Serafina Royals, PT 04/10/16 4:47 PM

## 2016-04-10 NOTE — Patient Instructions (Signed)
Self manual lymph drainage: Perform this sequence once a day.  Only give enough pressure no your skin to make the skin move.  Diaphragmatic - Supine   Inhale through nose making navel move out toward hands. Exhale through puckered lips, hands follow navel in. Repeat _5__ times. Rest _10__ seconds between repeats.   Copyright  VHI. All rights reserved.  Hug yourself.  Do circles at your neck just above your collarbones.  Repeat this 10 times.    .  LEG: Inguinal Nodes Stimulation   With small finger side of hand against hip crease on involved side (right), gently perform circles at the crease. Repeat __10_ times.   Copyright  VHI. All rights reserved.  1) Axilla to Inguinal Nodes - Pump   On involved side, pump _4__ times from armpit along side of trunk to hip crease.    Using your hand to stretch the skin, direct fluid to treat all of breast tissue downward and outward toward the  pathway that is aimed at the left groin. Do several paths across the breast and down toward the pathway down your side, repeating each 5-7 times.  Repeat the pumping stroke down your right side from armpit to groin.   Repeat the steps above where you do circles in your left groin if you like. Copyright  VHI. All rights reserved.

## 2016-04-17 ENCOUNTER — Encounter: Payer: Medicare Other | Admitting: Physical Therapy

## 2016-04-23 ENCOUNTER — Other Ambulatory Visit: Payer: Self-pay | Admitting: Family Medicine

## 2016-04-24 ENCOUNTER — Ambulatory Visit: Payer: Medicare Other | Admitting: Physical Therapy

## 2016-04-24 DIAGNOSIS — M25552 Pain in left hip: Secondary | ICD-10-CM | POA: Diagnosis not present

## 2016-04-24 DIAGNOSIS — M545 Low back pain: Secondary | ICD-10-CM | POA: Diagnosis not present

## 2016-04-24 DIAGNOSIS — I890 Lymphedema, not elsewhere classified: Secondary | ICD-10-CM

## 2016-04-24 NOTE — Therapy (Addendum)
[place], [place], [address] Phone: [phone]   Fax:  [phone]  Physical Therapy Treatment  Patient Details  Name: Tracy [name] MRN: [id number] Date of Birth: 06-08-1946 Referring Provider: Dr. [name]  Encounter Date: 04/24/2016      PT End of Session - 04/24/16 1659    Visit Number 3   Number of Visits 5   Date for PT Re-Evaluation 05/03/16   PT Start Time 1600   PT Stop Time 1647   PT Time Calculation (min) 47 min   Activity Tolerance Patient tolerated treatment well   Behavior During Therapy [facility] for tasks assessed/performed      Past Medical History:  Diagnosis Date  . Anxiety   . Arthritis   . Breast cancer ([place]) 06/13/15  . Cancer ([place]) 2000   breast cancer  . Chronic bronchitis ([place])   . Chronic bronchitis ([place])   . Hyperlipidemia   . Hypertension   . Myocardial infarction ([place]) 2001  . Restless leg   . Stroke [facility]) 2004   TIA, no deficits    Past Surgical History:  Procedure Laterality Date  . ABDOMINAL HYSTERECTOMY  1985  . BREAST SURGERY  2001   lt breast lumpectomy  . RADIOACTIVE SEED GUIDED MASTECTOMY WITH AXILLARY SENTINEL LYMPH NODE BIOPSY Right 07/07/2015   Procedure: RIGHT RADIOACTIVE SEED GUIDED PARTIAL MASTECTOMY WITH AXILLARY SENTINEL LYMPH NODE BIOPSY;  Surgeon: Autumn Messing III, MD;  Location: [facility];  Service: General;  Laterality: Right;  . SMALL INTESTINE SURGERY    . TUBAL LIGATION      There were no vitals filed for this visit.      Subjective Assessment - 04/24/16 1601    Subjective "I'm doing better than I did yesterday.  I helped a friend move and I got sore in my back and hip from that."  Tried the manual lymph drainage, and it helps, but still feels like it swells up if she doesn't keep compression on it.  Has worn sports bra and that helps.  Wearing a bra at night helps but it's not that comfortable.  The foam piece  irritated her skin allergies so she had to take it out of the bra.   Currently in Pain? No/denies                         [facility] Adult PT Treatment/Exercise - 04/24/16 0001      Self-Care   Other Self-Care Comments  Reviewed self-manual lymph drainage and had patient perform this with verbal and tactile cueing from PT.  Also gave patient some TG soft for her to see if this would allow her to use foam in bra without skin irritation.     Manual Therapy   Manual Lymphatic Drainage (MLD) In supine, short neck, diaphragmatic breathing with resistance x 5, right groin and axillo-inguinal anastomosis, and right breast, directing toward right axillo-inguinal anastomosis.                PT Education - 04/24/16 1659    Education provided Yes   Education Details self-manual lymph drainage   Person(s) Educated Patient   Methods Explanation;Demonstration;Tactile cues;Verbal cues;Handout   Comprehension Verbalized understanding;Returned demonstration;Verbal cues required;Tactile cues required                [facility] Goals - 04/24/16 1644      CC Long Term Goal  #1  Title patient will be knowledgeable about self-manual lymph drainage for right breast   Status Achieved     CC Long Term Goal  #2   Title patient will be knowledgeable about compression bras, where and how to obtain one   Status Achieved     CC Long Term Goal  #3   Title Pt. will be independent with home exercise program for left shoulder ROM and strengthening.   Status On-going     CC Long Term Goal  #4   Title patient will be knowledgeable about home exercise program for left hip ROM and strengthening   Status On-going            Plan - 04/24/16 1700    Clinical Impression Statement Patient has been wearing sports bras at least some of the time and finds them effective for controlling breast edema. She does still plan to order a compression bra from [facility] when she has the  money saved.  She needed review of self-manual lymph drainage today, as well as some modifications to use because she can't use her left arm very well due to swelling in it.  She wants to put her treatment on hold for more than a month, again due to concern about finances.  She plans to call me in October to let me know how things are going and when she thinks she will be able to return.  She does feel like she will need more therapy, just doesn't want to go into debt for it.   Rehab Potential Good   Clinical Impairments Affecting Rehab Potential treatment for breast cancer bilat.   PT Frequency 1x / week   PT Duration 4 weeks   PT Treatment/Interventions ADLs/Self Care Home Management;Manual techniques;Manual lymph drainage;Patient/family education   PT Next Visit Plan Patient will be on hold for about six weeks.  She will call to schedule when she feels able to afford further therapy.  At that time, reassess, then review self-manual lymph drainage and decide on next course of treatment--possibly hip or back exercise.   PT Home Exercise Plan self-manual lymph drainage      Patient will benefit from skilled therapeutic intervention in order to improve the following deficits and impairments:  Abnormal gait, Decreased knowledge of precautions, Decreased knowledge of use of DME, Decreased range of motion, Decreased strength, Increased edema, Impaired UE functional use, Pain  Visit Diagnosis: Lymphedema, not elsewhere classified     Problem List Patient Active Problem List   Diagnosis Date Noted  . Anemia of chronic disease 09/28/2015  . Genetic testing 08/21/2015  . History of left breast cancer 07/13/2015  . Breast cancer of upper-outer quadrant of right female breast ([place]) 06/20/2015  . Maxillary sinusitis 05/15/2015  . Nausea without vomiting 05/15/2015  . Boil 03/16/2015  . Bee sting 03/02/2015  . Hyperlipidemia associated with type 2 diabetes mellitus ([place]) 12/29/2014  . Hearing loss  due to cerumen impaction 12/29/2014  . Atypical chest pain 09/05/2014  . Abdominal pain, epigastric 08/25/2014  . Diabetes mellitus type II, controlled ([place]) 05/19/2014  . Allergy to adhesive tape 05/19/2014  . Other and unspecified hyperlipidemia 12/06/2013  . GERD (gastroesophageal reflux disease) 12/06/2013  . Severe headache 11/11/2013  . Cervical disc disease 11/11/2013  . Osteopenia 05/25/2013  . Nausea alone 05/10/2013  . Allergic asthma 12/24/2012  . Insomnia 04/02/2012  . Physical exam, annual 04/02/2012  . Cramps, muscle, general 02/25/2012  . Chest pain 02/25/2012  . Sinusitis acute 02/19/2012  .  HTN (hypertension) 02/03/2012  . Vertigo, benign positional 02/03/2012  . Left groin pain 02/03/2012    Damonique Brunelle 04/24/2016, 5:07 PM  [place], [place], [address] Phone: [phone]   Fax:  [phone]  Name: Tracy [name] MRN: [id number] Date of Birth: November 24, 1945  Serafina Royals, PT 04/24/16 5:08 PM   PHYSICAL THERAPY DISCHARGE SUMMARY  Visits from Start of Care: 3  Current functional level related to goals / functional outcomes: Goals partially met as noted above.   Remaining deficits: Unknown. Patient had wanted to suspend therapy due to financial concerns.  She was called 06/26/16 and message was left but she did not return our call to inquire about her wishes.   Education / Equipment: Home exercise program. Plan: Patient agrees to discharge.  Patient goals were partially met. Patient is being discharged due to financial reasons.  ?????   Serafina Royals, PT 07/03/16 9:59 AM

## 2016-05-06 ENCOUNTER — Other Ambulatory Visit: Payer: Self-pay | Admitting: General Practice

## 2016-05-06 ENCOUNTER — Telehealth: Payer: Self-pay | Admitting: Family Medicine

## 2016-05-06 MED ORDER — OMEPRAZOLE 40 MG PO CPDR: 40.0000 mg | DELAYED_RELEASE_CAPSULE | Freq: Two times a day (BID) | ORAL | 6 refills | Status: DC

## 2016-05-06 NOTE — Telephone Encounter (Signed)
Tracy [name] with [facility] calling to request refill of omeprazole (PRILOSEC) 40 MG capsule.  [facility] - [place], [place] - [address] [phone] (Phone) [phone] (Fax)

## 2016-05-06 NOTE — Progress Notes (Signed)
Medication filled to pharmacy as requested.   

## 2016-05-21 ENCOUNTER — Other Ambulatory Visit: Payer: Self-pay | Admitting: Family Medicine

## 2016-05-30 ENCOUNTER — Ambulatory Visit
Admission: RE | Admit: 2016-05-30 | Discharge: 2016-05-30 | Disposition: A | Discharge status: Home / Self Care | Payer: Medicare Other | Source: Ambulatory Visit | Attending: Hematology and Oncology | Admitting: Hematology and Oncology

## 2016-05-30 DIAGNOSIS — C50411 Malignant neoplasm of upper-outer quadrant of right female breast: Secondary | ICD-10-CM

## 2016-05-30 DIAGNOSIS — R928 Other abnormal and inconclusive findings on diagnostic imaging of breast: Secondary | ICD-10-CM | POA: Diagnosis not present

## 2016-05-31 ENCOUNTER — Ambulatory Visit (INDEPENDENT_AMBULATORY_CARE_PROVIDER_SITE_OTHER): Payer: Medicare Other | Admitting: Family Medicine

## 2016-05-31 ENCOUNTER — Telehealth: Payer: Self-pay | Admitting: Emergency Medicine

## 2016-05-31 ENCOUNTER — Encounter: Payer: Self-pay | Admitting: Family Medicine

## 2016-05-31 VITALS — BP 120/72 | HR 75 | Temp 98.1°F | Resp 16 | Ht 60.0 in | Wt 197.0 lb

## 2016-05-31 DIAGNOSIS — Z0000 Encounter for general adult medical examination without abnormal findings: Secondary | ICD-10-CM | POA: Diagnosis not present

## 2016-05-31 DIAGNOSIS — E785 Hyperlipidemia, unspecified: Secondary | ICD-10-CM

## 2016-05-31 DIAGNOSIS — E119 Type 2 diabetes mellitus without complications: Secondary | ICD-10-CM | POA: Diagnosis not present

## 2016-05-31 DIAGNOSIS — M8580 Other specified disorders of bone density and structure, unspecified site: Secondary | ICD-10-CM | POA: Diagnosis not present

## 2016-05-31 DIAGNOSIS — I10 Essential (primary) hypertension: Secondary | ICD-10-CM | POA: Diagnosis not present

## 2016-05-31 LAB — CBC WITH DIFFERENTIAL/PLATELET
BASOS PCT: 0.6 % (ref 0.0–3.0)
Basophils Absolute: 0 10*3/uL (ref 0.0–0.1)
EOS PCT: 2 % (ref 0.0–5.0)
Eosinophils Absolute: 0.1 10*3/uL (ref 0.0–0.7)
HEMATOCRIT: 24.1 % — AB (ref 36.0–46.0)
LYMPHS PCT: 37.2 % (ref 12.0–46.0)
Lymphs Abs: 2.6 10*3/uL (ref 0.7–4.0)
MCHC: 30.3 g/dL (ref 30.0–36.0)
MCV: 73.7 fl — ABNORMAL LOW (ref 78.0–100.0)
Monocytes Absolute: 0.4 10*3/uL (ref 0.1–1.0)
Monocytes Relative: 6.2 % (ref 3.0–12.0)
Neutro Abs: 3.7 10*3/uL (ref 1.4–7.7)
Neutrophils Relative %: 54 % (ref 43.0–77.0)
Platelets: 302 10*3/uL (ref 150.0–400.0)
RBC: 3.27 Mil/uL — AB (ref 3.87–5.11)
RDW: 18.6 % — AB (ref 11.5–15.5)
WBC: 6.9 10*3/uL (ref 4.0–10.5)

## 2016-05-31 LAB — VITAMIN D 25 HYDROXY (VIT D DEFICIENCY, FRACTURES): VITD: 40.68 ng/mL (ref 30.00–100.00)

## 2016-05-31 LAB — HEPATIC FUNCTION PANEL
ALBUMIN: 3.8 g/dL (ref 3.5–5.2)
ALT: 11 U/L (ref 0–35)
AST: 13 U/L (ref 0–37)
Alkaline Phosphatase: 97 U/L (ref 39–117)
BILIRUBIN TOTAL: 0.2 mg/dL (ref 0.2–1.2)
Bilirubin, Direct: 0.1 mg/dL (ref 0.0–0.3)
Total Protein: 7.3 g/dL (ref 6.0–8.3)

## 2016-05-31 LAB — BASIC METABOLIC PANEL
BUN: 11 mg/dL (ref 6–23)
CALCIUM: 9.4 mg/dL (ref 8.4–10.5)
CO2: 26 mEq/L (ref 19–32)
CREATININE: 0.95 mg/dL (ref 0.40–1.20)
Chloride: 104 mEq/L (ref 96–112)
GFR: 74.33 mL/min (ref >60.00)
Glucose, Bld: 122 mg/dL — ABNORMAL HIGH (ref 70–99)
Potassium: 4.2 mEq/L (ref 3.5–5.1)
Sodium: 140 mEq/L (ref 135–145)

## 2016-05-31 LAB — TSH: TSH: 2.76 u[IU]/mL (ref 0.35–4.50)

## 2016-05-31 LAB — LIPID PANEL
CHOL/HDL RATIO: 2
Cholesterol: 147 mg/dL (ref 0–200)
HDL: 61.9 mg/dL (ref >39.00)
LDL CALC: 69 mg/dL (ref 0–99)
NONHDL: 85.51
Triglycerides: 85 mg/dL (ref 0.0–149.0)
VLDL: 17 mg/dL (ref 0.0–40.0)

## 2016-05-31 LAB — HEMOGLOBIN A1C: Hgb A1c MFr Bld: 7.7 % — ABNORMAL HIGH (ref 4.6–6.5)

## 2016-05-31 MED ORDER — FERROUS SULFATE 325 (65 FE) MG PO TABS: 325.0000 mg | ORAL_TABLET | Freq: Every day | ORAL | 6 refills | Status: DC

## 2016-05-31 NOTE — Assessment & Plan Note (Signed)
Chronic problem.  Diet controlled.  On ARB for renal protection.  UTD on eye exam.  Foot exam done today.  Stressed need for healthy diet and exercise as able.  Check labs.  Start meds prn.

## 2016-05-31 NOTE — Progress Notes (Signed)
Pre visit review using our clinic review tool, if applicable. No additional management support is needed unless otherwise documented below in the visit note. 

## 2016-05-31 NOTE — Addendum Note (Signed)
Addended by: Davis Gourd on: 05/31/2016 04:35 PM   Modules accepted: Orders

## 2016-05-31 NOTE — Assessment & Plan Note (Signed)
Chronic problem.  Well controlled today.  Asymptomatic at this time.  Check labs.  No anticipated med changes.  Will follow. 

## 2016-05-31 NOTE — Progress Notes (Signed)
   Subjective:    Patient ID: Tracy [name], female    DOB: [birth year], 70 y.o.   MRN: [id number]  HPI Here today for CPE.  Risk Factors: HTN- chronic problem, on Diovan 160mg  w/ good control Hyperlipidemia- chronic problem, on Lipitor daily DM- currently diet controlled, on ARB for renal protection.  UTD on eye exam.   Physical Activity: no formal exercise Fall Risk: low Depression: no current sxs Hearing: normal to conversational tones, mildly decreased to whispered voice ADL's: independent Cognitive: normal linear thought process, memory and attention intact Home Safety: safe at home Height, Weight, BMI, Visual Acuity: see vitals, vision 20/25 w/ no correction Counseling: UTD on colonoscopy, mammo.  No need for pap.  Due for DEXA.  UTD on pneumonia and flu shots Care team reviewed and updated w/ pt Labs Ordered: See A&P Care Plan: See A&P    Review of Systems Patient reports no vision/ hearing changes, adenopathy,fever, weight change,  persistant/recurrent hoarseness , swallowing issues, chest pain, palpitations, edema, persistant/recurrent cough, hemoptysis, dyspnea (rest/exertional/paroxysmal nocturnal), gastrointestinal bleeding (melena, rectal bleeding), abdominal pain, significant heartburn, bowel changes, GU symptoms (dysuria, hematuria, incontinence), Gyn symptoms (abnormal  bleeding, pain), focal weakness, memory loss, numbness & tingling, skin/hair/nail changes, abnormal bruising or bleeding, anxiety, or depression.   + syncope- pt had 2 episodes of momentary 'blacking out' when she stood up too fast    Objective:   Physical Exam General Appearance:    Alert, cooperative, no distress, appears stated age  Head:    Normocephalic, without obvious abnormality, atraumatic  Eyes:    PERRL, conjunctiva/corneas clear, EOM's intact, fundi    benign, both eyes  Ears:    Normal TM's and external ear canals, both ears  Nose:   Nares normal, septum midline, mucosa normal, no  drainage    or sinus tenderness  Throat:   Lips, mucosa, and tongue normal; teeth and gums normal  Neck:   Supple, symmetrical, trachea midline, no adenopathy;    Thyroid: no enlargement/tenderness/nodules  Back:     Symmetric, no curvature, ROM normal, no CVA tenderness  Lungs:     Clear to auscultation bilaterally, respirations unlabored  Chest Wall:    No tenderness or deformity   Heart:    Regular rate and rhythm, S1 and S2 normal, no murmur, rub   or gallop  Breast Exam:    Deferred to mammo  Abdomen:     Soft, non-tender, bowel sounds active all four quadrants,    no masses, no organomegaly  Genitalia:    Deferred  Rectal:    Extremities:   Extremities normal, atraumatic, no cyanosis or edema  Pulses:   2+ and symmetric all extremities  Skin:   Skin color, texture, turgor normal, no rashes or lesions  Lymph nodes:   Cervical, supraclavicular, and axillary nodes normal  Neurologic:   CNII-XII intact, normal strength, sensation and reflexes    throughout          Assessment & Plan:

## 2016-05-31 NOTE — Patient Instructions (Addendum)
Follow up in 3-4 months to recheck diabetes We'll notify you of your lab results and make any changes if needed Continue to work on healthy diet and try and get regular activity We'll call you with your bone density appt You are up to date on colonoscopy until 2023- yay! You are up to date on your mammogram until next year- yay! Make sure you increase your water intake so that your blood pressure doesn't drop when you stand up If you don't get out of your slump, please let me know! Call with any questions or concerns Happy Holidays!!!

## 2016-05-31 NOTE — Assessment & Plan Note (Signed)
Chronic problem.  On Ca and Vit D.  Due for repeat DEXA- order entered.

## 2016-05-31 NOTE — Telephone Encounter (Signed)
Pt informed, stated an understanding, med filled to new pharmacy as requested. IFOB mailed to pt.

## 2016-05-31 NOTE — Assessment & Plan Note (Signed)
PE unchanged from previous.  UTD on mammo, colonoscopy, immunizations.  Order entered for DEXA.  Written screening schedule updated and given to pt.  Check labs.  Anticipatory guidance provided.

## 2016-05-31 NOTE — Telephone Encounter (Signed)
Tracy [name] from [facility] lab called with critical lab HGB-7.3

## 2016-05-31 NOTE — Telephone Encounter (Signed)
Pt's hemoglobin is again low.  Please have her complete an iFOB to r/o GI blood loss and she needs to start FeSO4 325mg  daily and add an OTC stool softener

## 2016-05-31 NOTE — Assessment & Plan Note (Signed)
Chronic problem, tolerating statin w/o difficulty.  Check labs.  Adjust meds prn  

## 2016-06-03 ENCOUNTER — Other Ambulatory Visit: Payer: Self-pay | Admitting: Family Medicine

## 2016-06-03 DIAGNOSIS — D582 Other hemoglobinopathies: Secondary | ICD-10-CM

## 2016-06-10 ENCOUNTER — Other Ambulatory Visit: Payer: Self-pay | Admitting: Family Medicine

## 2016-06-19 ENCOUNTER — Other Ambulatory Visit (INDEPENDENT_AMBULATORY_CARE_PROVIDER_SITE_OTHER): Payer: Medicare Other

## 2016-06-19 DIAGNOSIS — D582 Other hemoglobinopathies: Secondary | ICD-10-CM | POA: Diagnosis not present

## 2016-06-19 LAB — FECAL OCCULT BLOOD, IMMUNOCHEMICAL: Fecal Occult Bld: NEGATIVE

## 2016-07-05 ENCOUNTER — Ambulatory Visit
Admission: RE | Admit: 2016-07-05 | Discharge: 2016-07-05 | Disposition: A | Discharge status: Home / Self Care | Payer: Medicare Other | Source: Ambulatory Visit | Attending: Family Medicine | Admitting: Family Medicine

## 2016-07-05 DIAGNOSIS — M8588 Other specified disorders of bone density and structure, other site: Secondary | ICD-10-CM | POA: Diagnosis not present

## 2016-07-05 DIAGNOSIS — Z780 Asymptomatic menopausal state: Secondary | ICD-10-CM | POA: Diagnosis not present

## 2016-07-05 DIAGNOSIS — M8580 Other specified disorders of bone density and structure, unspecified site: Secondary | ICD-10-CM

## 2016-09-12 ENCOUNTER — Other Ambulatory Visit: Payer: Self-pay | Admitting: General Practice

## 2016-09-12 MED ORDER — OMEPRAZOLE 40 MG PO CPDR: 40.0000 mg | DELAYED_RELEASE_CAPSULE | Freq: Two times a day (BID) | ORAL | 1 refills | Status: DC

## 2016-09-12 MED ORDER — VALSARTAN 160 MG PO TABS: 160.0000 mg | ORAL_TABLET | Freq: Every day | ORAL | 1 refills | Status: DC

## 2016-09-17 ENCOUNTER — Other Ambulatory Visit: Payer: Self-pay | Admitting: General Practice

## 2016-09-17 MED ORDER — ATORVASTATIN CALCIUM 10 MG PO TABS: 10.0000 mg | ORAL_TABLET | Freq: Every day | ORAL | 0 refills | Status: DC

## 2016-09-19 ENCOUNTER — Other Ambulatory Visit: Payer: Self-pay | Admitting: General Practice

## 2016-09-19 MED ORDER — TRAZODONE HCL 100 MG PO TABS: 100.0000 mg | ORAL_TABLET | Freq: Every day | ORAL | 1 refills | Status: DC

## 2016-09-23 ENCOUNTER — Encounter: Payer: Self-pay | Admitting: Family Medicine

## 2016-09-23 ENCOUNTER — Ambulatory Visit (INDEPENDENT_AMBULATORY_CARE_PROVIDER_SITE_OTHER): Payer: Medicare Other | Admitting: Family Medicine

## 2016-09-23 VITALS — BP 121/71 | HR 71 | Temp 98.5°F | Resp 16 | Ht 60.0 in | Wt 196.1 lb

## 2016-09-23 DIAGNOSIS — D509 Iron deficiency anemia, unspecified: Secondary | ICD-10-CM

## 2016-09-23 DIAGNOSIS — E119 Type 2 diabetes mellitus without complications: Secondary | ICD-10-CM

## 2016-09-23 LAB — CBC WITH DIFFERENTIAL/PLATELET
BASOS ABS: 0 10*3/uL (ref 0.0–0.1)
BASOS PCT: 0.5 % (ref 0.0–3.0)
EOS PCT: 2.1 % (ref 0.0–5.0)
Eosinophils Absolute: 0.1 10*3/uL (ref 0.0–0.7)
HCT: 34.8 % — ABNORMAL LOW (ref 36.0–46.0)
Hemoglobin: 11.5 g/dL — ABNORMAL LOW (ref 12.0–15.0)
LYMPHS PCT: 45 % (ref 12.0–46.0)
Lymphs Abs: 3 10*3/uL (ref 0.7–4.0)
MCHC: 33.1 g/dL (ref 30.0–36.0)
MCV: 89.8 fl (ref 78.0–100.0)
MONOS PCT: 6.4 % (ref 3.0–12.0)
Monocytes Absolute: 0.4 10*3/uL (ref 0.1–1.0)
NEUTROS ABS: 3.1 10*3/uL (ref 1.4–7.7)
Neutrophils Relative %: 46 % (ref 43.0–77.0)
PLATELETS: 250 10*3/uL (ref 150.0–400.0)
RBC: 3.87 Mil/uL (ref 3.87–5.11)
RDW: 14.9 % (ref 11.5–15.5)
WBC: 6.6 10*3/uL (ref 4.0–10.5)

## 2016-09-23 LAB — BASIC METABOLIC PANEL
BUN: 8 mg/dL (ref 6–23)
CO2: 28 meq/L (ref 19–32)
Calcium: 9.4 mg/dL (ref 8.4–10.5)
Chloride: 104 mEq/L (ref 96–112)
Creatinine, Ser: 0.89 mg/dL (ref 0.40–1.20)
GFR: 80.07 mL/min (ref >60.00)
Glucose, Bld: 116 mg/dL — ABNORMAL HIGH (ref 70–99)
POTASSIUM: 4.1 meq/L (ref 3.5–5.1)
SODIUM: 140 meq/L (ref 135–145)

## 2016-09-23 LAB — IBC PANEL
Iron: 54 ug/dL (ref 42–145)
SATURATION RATIOS: 15.4 % — AB (ref 20.0–50.0)
Transferrin: 250 mg/dL (ref 212.0–360.0)

## 2016-09-23 LAB — HEMOGLOBIN A1C: HEMOGLOBIN A1C: 7.4 % — AB (ref 4.6–6.5)

## 2016-09-23 NOTE — Assessment & Plan Note (Signed)
Hgb was down to 7.3 at last visit.  Pt is taking daily iron but having a lot of GI side effects.  Repeat CBC and check iron panel to determine if referral is needed to hematology.  Pt expressed understanding and is in agreement w/ plan.

## 2016-09-23 NOTE — Progress Notes (Signed)
   Subjective:    Patient ID: Tracy [name], female    DOB: Nov 28, 1945, 71 y.o.   MRN: [id number]  HPI DM- chronic problem, currently diet controlled.  On ARB for renal protection.  UTD on eye exam, foot exam.  No CP, SOB, HAs, visual changes, edema.  Iron deficiency anemia- pt's Hgb was down to 7.3 at last visit.  Currently on daily iron supplement but this is causing GI upset and severe constipation.  Pt is not drinking much water.   Review of Systems For ROS see HPI     Objective:   Physical Exam  Constitutional: She is oriented to person, place, and time. She appears well-developed and well-nourished. No distress.  obese  HENT:  Head: Normocephalic and atraumatic.  Eyes: Conjunctivae and EOM are normal. Pupils are equal, round, and reactive to light.  Neck: Normal range of motion. Neck supple. No thyromegaly present.  Cardiovascular: Normal rate, regular rhythm, normal heart sounds and intact distal pulses.   No murmur heard. Pulmonary/Chest: Effort normal and breath sounds normal. No respiratory distress.  Abdominal: Soft. She exhibits no distension. There is no tenderness.  Musculoskeletal: She exhibits no edema.  Lymphadenopathy:    She has no cervical adenopathy.  Neurological: She is alert and oriented to person, place, and time.  Skin: Skin is warm and dry.  Psychiatric: She has a normal mood and affect. Her behavior is normal.  Vitals reviewed.         Assessment & Plan:

## 2016-09-23 NOTE — Patient Instructions (Signed)
Follow up in 3-4 months to recheck diabetes and cholesterol We'll notify you of your lab results and make any changes if needed Continue to work on healthy diet and regular exercise- you can do it!!! Call with any questions or concerns Happy Spring!!! 

## 2016-09-23 NOTE — Assessment & Plan Note (Signed)
Chronic problem.  Currently diet controlled.  UTD on foot exam, eye exam.  Currently asymptomatic.  Stressed need for healthy diet and regular exercise.  Check labs.  Start meds prn.

## 2016-09-23 NOTE — Progress Notes (Signed)
Pre visit review using our clinic review tool, if applicable. No additional management support is needed unless otherwise documented below in the visit note. 

## 2016-09-24 ENCOUNTER — Encounter: Payer: Self-pay | Admitting: General Practice

## 2016-10-08 ENCOUNTER — Other Ambulatory Visit: Payer: Self-pay | Admitting: Family Medicine

## 2016-10-13 ENCOUNTER — Other Ambulatory Visit: Payer: Self-pay | Admitting: Family Medicine

## 2016-10-15 DIAGNOSIS — M25552 Pain in left hip: Secondary | ICD-10-CM | POA: Diagnosis not present

## 2016-10-15 DIAGNOSIS — M533 Sacrococcygeal disorders, not elsewhere classified: Secondary | ICD-10-CM | POA: Diagnosis not present

## 2016-10-24 DIAGNOSIS — M7502 Adhesive capsulitis of left shoulder: Secondary | ICD-10-CM | POA: Diagnosis not present

## 2016-10-24 DIAGNOSIS — M533 Sacrococcygeal disorders, not elsewhere classified: Secondary | ICD-10-CM | POA: Diagnosis not present

## 2016-11-07 ENCOUNTER — Other Ambulatory Visit: Payer: Self-pay | Admitting: Family Medicine

## 2016-11-20 ENCOUNTER — Ambulatory Visit (INDEPENDENT_AMBULATORY_CARE_PROVIDER_SITE_OTHER): Payer: Medicare Other | Admitting: Family Medicine

## 2016-11-20 ENCOUNTER — Encounter: Payer: Self-pay | Admitting: Family Medicine

## 2016-11-20 VITALS — BP 122/80 | HR 88 | Temp 99.0°F | Resp 16 | Ht 60.0 in | Wt 196.2 lb

## 2016-11-20 DIAGNOSIS — K5903 Drug induced constipation: Secondary | ICD-10-CM | POA: Diagnosis not present

## 2016-11-20 NOTE — Progress Notes (Signed)
Pre visit review using our clinic review tool, if applicable. No additional management support is needed unless otherwise documented below in the visit note. 

## 2016-11-20 NOTE — Patient Instructions (Addendum)
Follow up as needed/scheduled Your symptoms sound consistent w/ constipation Increase your water intake- this will help w/ constipation Start the Dulcolax- 2 tabs once daily x2-3 days to clean you out.  This can take 6-10 hrs to work so don't get frustrated If symptoms don't improve after BMs- please let me know! Call with any questions or concerns Hang in there!!!

## 2016-11-20 NOTE — Progress Notes (Signed)
   Subjective:    Patient ID: Tracy [name], female    DOB: 27-Jul-1946, 71 y.o.   MRN: [id number]  HPI abd pain- pt reports this weekend she had abd cramping, the feeling of needing to have BM but unable to go.  + nausea, gas/bloating.  Sunday she took laxative and 'felt a whole lot better'.  Yesterday AM also felt better but sxs returned yesterday afternoon.  No BM yesterday and very small BM today.  On Sunday, passed 'a bunch of hard round balls'.  Pt reports she has struggled w/ constipation since starting iron.   Review of Systems     Objective:   Physical Exam  Constitutional: She is oriented to person, place, and time. She appears well-developed and well-nourished. No distress.  obese  HENT:  Head: Normocephalic and atraumatic.  Cardiovascular: Normal rate, regular rhythm and normal heart sounds.   Pulmonary/Chest: Effort normal and breath sounds normal.  Abdominal: Bowel sounds are normal. She exhibits distension. She exhibits no mass. There is no tenderness. There is no rebound and no guarding.  Neurological: She is alert and oriented to person, place, and time.  Skin: Skin is warm and dry.  Vitals reviewed.         Assessment & Plan:  Drug induced constipation- new.  Pt struggles w/ constipation due to iron use.  She reports she has had severe abdominal cramping, nausea.  sxs improved after using Laxative on Sunday.  Encouraged her to increase water intake.  Discussed Miralax use but pt already bought Dulcolax and would prefer to use this.  If sxs don't improve w/ regular BMs will need to reassess or consider GI referral.  Pt expressed understanding and is in agreement w/ plan.

## 2016-11-22 ENCOUNTER — Telehealth: Payer: Self-pay | Admitting: Family Medicine

## 2016-11-22 DIAGNOSIS — K219 Gastro-esophageal reflux disease without esophagitis: Secondary | ICD-10-CM

## 2016-11-22 NOTE — Telephone Encounter (Signed)
Ok to refer to Neuro for weakness and GI for constipation

## 2016-11-22 NOTE — Telephone Encounter (Signed)
Tracy [name] with pt, she wants to wait until Monday and she will let us know how she is feeling, Neuro referral for weakness and Gi for constipation.   Pt did start omeprazole.

## 2016-11-22 NOTE — Telephone Encounter (Signed)
Pt asking for a referral to neurologist, pt states that is still having some issues from Wednesday's visit.

## 2016-11-22 NOTE — Telephone Encounter (Signed)
Will wait until Monday when pt calls back.

## 2016-11-25 NOTE — Addendum Note (Signed)
Addended by: Davis Gourd on: 11/25/2016 10:52 AM   Modules accepted: Orders

## 2016-11-25 NOTE — Telephone Encounter (Signed)
Pt called back today to state that she would like a referral to back to Dr. Earlean Shawl for her GI issues.

## 2016-11-25 NOTE — Telephone Encounter (Signed)
Referral to GI placed

## 2016-12-04 ENCOUNTER — Telehealth: Payer: Self-pay | Admitting: Family Medicine

## 2016-12-04 ENCOUNTER — Encounter: Payer: Self-pay | Admitting: Gastroenterology

## 2016-12-04 ENCOUNTER — Ambulatory Visit (INDEPENDENT_AMBULATORY_CARE_PROVIDER_SITE_OTHER): Payer: Medicare Other | Admitting: Gastroenterology

## 2016-12-04 ENCOUNTER — Telehealth: Payer: Self-pay | Admitting: Gastroenterology

## 2016-12-04 VITALS — BP 120/64 | HR 76 | Ht 60.0 in | Wt 196.6 lb

## 2016-12-04 DIAGNOSIS — R194 Change in bowel habit: Secondary | ICD-10-CM

## 2016-12-04 NOTE — Telephone Encounter (Signed)
Patient Name: Tracy [name]  DOB: [birth year]    Initial Comment caller states she went to see gastro DR. today . He noticed in her record her blood count was really low. She was on iron but stopped it . caller wants to know if that would cause weakness an tiredness. the iron constipated her an that's why she stopped taking it    Nurse Assessment  Nurse: Leilani Merl, RN, Heather Date/Time (Eastern Time): 12/04/2016 12:32:13 PM  Confirm and document reason for call. If symptomatic, describe symptoms. ---caller states she went to see gastro DR. today . He noticed in her record her blood count was really low. She was on iron but stopped it . caller wants to know if that would cause weakness an tiredness. the iron constipated her an that's why she stopped taking it  Does the patient have any new or worsening symptoms? ---Yes  Will a triage be completed? ---Yes  Related visit to physician within the last 2 weeks? ---No  Does the PT have any chronic conditions? (i.e. diabetes, asthma, etc.) ---Yes  List chronic conditions. ---See MR  Is this a behavioral health or substance abuse call? ---No     Guidelines    Guideline Title Affirmed Question Affirmed Notes  Weakness (Generalized) and Fatigue [1] MILD weakness (i.e., does not interfere with ability to work, go to school, normal activities) AND [2] persists > 1 week    Final Disposition User   See PCP When Office is Open (within 3 days) Facilities manager, RN, Conservator, museum/gallery states that she wants to wait and see how the iron helps with her weakness before she makes an appt.   Referrals  GO TO FACILITY REFUSED   Disagree/Comply: Disagree  Disagree/Comply Reason: Disagree with instructions

## 2016-12-04 NOTE — Telephone Encounter (Signed)
Patient notified of PCP recommendations and is agreement and expresses an understanding.  

## 2016-12-04 NOTE — Telephone Encounter (Signed)
Yes.  Iron deficiency anemia will cause fatigue and weakness.  That's why she needs to be on the iron.  Yes.  The iron will cause constipation which is why we always start a stool softener or Miralax at the same time as the iron

## 2016-12-04 NOTE — Telephone Encounter (Signed)
Pt was advised to get Citrucel and follow the directions on the package.  The pt agreed and will call with any further concerns

## 2016-12-04 NOTE — Progress Notes (Signed)
Review of pertinent gastrointestinal problems: 1. Intermittent abd pains, UGi symptoms: EGD Dr. Earlean Shawl, 05/2011. Done for "reflux symptoms despite therapy."  Findings, "normal EGD" .  2016 evaluation by Dr. Ardis Hughs intermittent epigastric pains, nausea; somewhatpositional.  CT scan 07/2014 without clear etiology EGD 08/2014 Dr. Ardis Hughs found minor gastritis: H. Pylori negative. 2. Routine risk for colon cancer: Colonoscopy Dr. Earlean Shawl, 05/2011.  Done for "screening."  Findings "inflammation at splenic flexure, medium hemorrhoids." pathology was normal.  He recommended repeat colonoscopy at 5 years for unclear reasons.   HPI: This is a    very pleasant 71 year old woman whom I last saw about 2 years ago the time of an upper endoscopy. See those results summarized above   There is a note in epic, care everywhere system from Dr. Earlean Shawl, discharging her from his practice. This was from last week.  Chief complaint is change in bowels recently.  Usually has a BM every day, every morning.  Recently found to have low iron and started iron pills and this caused constipation.  Sensation of incomplete evacuation. She was told to change iron to every other day but things got worse and so she completely stopped the iron.    Hb was 7.3 05/2016, MCV was 74.    Hb was 11.5 in feb 2018.  Stool fecal occult testing November 2017 was negative for occult blood  Overall stable weight.  No FH of colon cancer.  Two weeks ago she was reaching up and felt something shift in her stomach.  Felt overall ill for that day.  Has been moving her bowels but very scibbolous.  She tried a laxative and felt better but she still felt week.  Then she tried a laxative for 3 days straight and then also started colace once daily.  She has real sensation of incomplete evacuation.   ROS: complete GI ROS as described in HPI.  Constitutional:  No unintentional weight loss   Past Medical History:  Diagnosis Date  . Anxiety    . Arthritis   . Breast cancer ([place]) 06/13/15  . Cancer ([place]) 2000   breast cancer  . Chronic bronchitis ([place])   . Chronic bronchitis ([place])   . Hyperlipidemia   . Hypertension   . Myocardial infarction ([place]) 2001  . Restless leg   . Stroke [facility]) 2004   TIA, no deficits    Past Surgical History:  Procedure Laterality Date  . ABDOMINAL HYSTERECTOMY  1985  . BREAST SURGERY  2001   lt breast lumpectomy  . RADIOACTIVE SEED GUIDED MASTECTOMY WITH AXILLARY SENTINEL LYMPH NODE BIOPSY Right 07/07/2015   Procedure: RIGHT RADIOACTIVE SEED GUIDED PARTIAL MASTECTOMY WITH AXILLARY SENTINEL LYMPH NODE BIOPSY;  Surgeon: Autumn Messing III, MD;  Location: [facility];  Service: General;  Laterality: Right;  . SMALL INTESTINE SURGERY    . TUBAL LIGATION      Current Outpatient Prescriptions  Medication Sig Dispense Refill  . albuterol (PROAIR HFA) 108 (90 Base) MCG/ACT inhaler Inhale 2 puffs into the lungs every 4 (four) hours as needed for wheezing. 1 Inhaler 6  . aspirin 81 MG tablet Take 81 mg by mouth daily.    Marland Kitchen atorvastatin (LIPITOR) 10 MG tablet TAKE 1 TABLET BY MOUTH  DAILY 90 tablet 1  . calcium carbonate (OS-CAL) 600 MG TABS tablet Take 1,200 mg by mouth daily with breakfast.     . cyclobenzaprine (FLEXERIL) 10 MG tablet Take 1 tablet (10 mg total) by mouth 3 (three) times daily  as needed for muscle spasms. 45 tablet 0  . ferrous sulfate 325 (65 FE) MG tablet Take 1 tablet (325 mg total) by mouth daily with breakfast. 30 tablet 6  . loratadine (CLARITIN) 10 MG tablet Take 10 mg by mouth daily.    . meclizine (ANTIVERT) 25 MG tablet Take 1 tablet (25 mg total) by mouth 3 (three) times daily as needed for dizziness. 30 tablet 1  . omeprazole (PRILOSEC) 40 MG capsule Take 1 capsule (40 mg total) by mouth 2 (two) times daily. 180 capsule 1  . traZODone (DESYREL) 100 MG tablet Take 1 tablet (100 mg total) by mouth at bedtime. 90 tablet 1  . valsartan (DIOVAN) 160 MG tablet TAKE 1  TABLET BY MOUTH DAILY 90 tablet 0  . Vitamin D, Cholecalciferol, 400 UNITS CAPS Take 2 capsules by mouth daily.     No current facility-administered medications for this visit.     Allergies as of 12/04/2016 - Review Complete 12/04/2016  Allergen Reaction Noted  . Bacitracin-neomycin-polymyxin  [neomycin-bacitracin zn-polymyx] Swelling 12/29/2014  . Nsaids Other (See Comments) 01/04/2015  . Tape Hives 11/07/2013  . Ambien [zolpidem tartrate]  04/02/2012  . Amoxicillin  02/03/2012  . Contrast media [iodinated diagnostic agents] Hives 11/06/2013  . Latex Swelling 09/14/2014  . Prednisone  09/05/2014    Family History  Problem Relation Age of Onset  . Diabetes Father   . Lung cancer Sister     dx. <50; former smoker  . Diabetes Brother   . Diabetes Paternal Aunt   . Stroke Maternal Grandmother   . Diabetes Paternal Grandmother   . Emphysema Mother 56    smoker  . Diabetes Brother   . Brain cancer Brother 25    unknown tumor type  . Cancer Daughter 76    neck cancer  . Other Daughter     hysterectomy for unspecified reason  . Breast cancer Cousin   . Cancer Cousin     unspecified type  . Breast cancer Other     triple negative breast cancer in her 46s  . Colon polyps Neg Hx   . Esophageal cancer Neg Hx   . Gallbladder disease Neg Hx     Social History   Social History  . Marital status: Divorced    Spouse name: N/A  . Number of children: 7  . Years of education: N/A   Occupational History  . retired    Social History Main Topics  . Smoking status: Former Smoker    Packs/day: 1.00    Years: 20.00    Types: Cigarettes  . Smokeless tobacco: Never Used     Comment: Quit >4 years ago; 1 ppd for about 5/20 years (remaining was less)  . Alcohol use No  . Drug use: No  . Sexual activity: Not on file   Other Topics Concern  . Not on file   Social History Narrative  . No narrative on file     Physical Exam: BP 120/64   Pulse 76   Ht 5' (1.524 m)   Wt  196 lb 9.6 oz (89.2 kg)   BMI 38.40 kg/m  Constitutional: generally well-appearing Psychiatric: alert and oriented x3 Abdomen: soft, nontender, nondistended, no obvious ascites, no peritoneal signs, normal bowel sounds No peripheral edema noted in lower extremities  Assessment and plan: 71 y.o. female Change in bowel habits  she really noticed a change in her bowel habits after she started iron supplements orally. Her hemoglobin was pretty low prior  to starting the iron supplements with a hemoglobin of in the sevens, she was microcytic. She was Hemoccult negative but I did tell her that I think it be best if we proceed with colonoscopy at this point. This would make sure she does not have any significant growths, neoplasms. She is not. Sinemet just yet she wants to try conservative measures such as fiber supplements to see if we can even out her bowels first. She knows to call here in one month to report on her response and if she does not notice significant improvement in her bowels I will likely recommend colonoscopy again.  Please see the "Patient Instructions" section for addition details about the plan.  Owens Loffler, MD [facility] 12/04/2016, 11:16 AM

## 2016-12-04 NOTE — Telephone Encounter (Signed)
Please advise 

## 2016-12-04 NOTE — Patient Instructions (Addendum)
Please start taking citrucel (orange flavored) powder fiber supplement.  This may cause some bloating at first but that usually goes away. Begin with a small spoonful and work your way up to a large, heaping spoonful daily over a week.  Call in one month to report on your symptoms. If you don't notice signficant improvement in your bowel habits (back to normal), then we will arrange colonoscopy.

## 2016-12-05 DIAGNOSIS — M25552 Pain in left hip: Secondary | ICD-10-CM | POA: Diagnosis not present

## 2016-12-12 ENCOUNTER — Telehealth: Payer: Self-pay | Admitting: Family Medicine

## 2016-12-12 MED ORDER — MECLIZINE HCL 25 MG PO TABS: 25.0000 mg | ORAL_TABLET | Freq: Three times a day (TID) | ORAL | 1 refills | Status: DC | PRN

## 2016-12-12 NOTE — Telephone Encounter (Signed)
Medication filled to pharmacy as requested.   

## 2016-12-12 NOTE — Telephone Encounter (Signed)
Patient requesting refill of meclizine (ANTIVERT) 25 MG tablet.  She is leaving to go out of town tonight and needs this filled today.  Pharmacy:  [facility], [address] [phone] (Phone) [phone] (Fax)

## 2017-01-09 ENCOUNTER — Other Ambulatory Visit: Payer: Self-pay | Admitting: Family Medicine

## 2017-01-14 ENCOUNTER — Other Ambulatory Visit: Payer: Self-pay | Admitting: Family Medicine

## 2017-02-18 ENCOUNTER — Telehealth: Payer: Self-pay | Admitting: Family Medicine

## 2017-02-18 NOTE — Telephone Encounter (Signed)
Pt states that she was told by her sister that valsartan was not good for her to take anymore and wants a call back stating why, I advise pt to call her pharmacy and she stated she can't "it's a mail order pharmacy, why can't my doctor just call me", I let pt know that I would send a message for a call back, she said thank you.

## 2017-02-18 NOTE — Telephone Encounter (Signed)
Pt called back after calling [facility] regarding the recall, stating that she need KT to call in a new bp medication for her. Pt states that she is upset that they have not reached out to her regarding this. Pt states the recall started back in May. Fax number to [facility] is [phone].

## 2017-02-19 MED ORDER — LOSARTAN POTASSIUM 100 MG PO TABS: 100.0000 mg | ORAL_TABLET | Freq: Every day | ORAL | 1 refills | Status: DC

## 2017-02-19 MED ORDER — LOSARTAN POTASSIUM 100 MG PO TABS: 100.0000 mg | ORAL_TABLET | Freq: Every day | ORAL | 0 refills | Status: DC

## 2017-02-19 NOTE — Telephone Encounter (Signed)
New medication sent to both local pharmacy and mail order. Will call and inform pt.

## 2017-02-19 NOTE — Telephone Encounter (Signed)
Please advise 

## 2017-02-19 NOTE — Addendum Note (Signed)
Addended by: Desmond Dike L on: 02/19/2017 08:30 AM   Modules accepted: Orders

## 2017-02-19 NOTE — Telephone Encounter (Signed)
Ok to switch to Losartan 100mg  daily

## 2017-02-24 DIAGNOSIS — H40013 Open angle with borderline findings, low risk, bilateral: Secondary | ICD-10-CM | POA: Diagnosis not present

## 2017-02-24 LAB — HM DIABETES EYE EXAM

## 2017-02-27 ENCOUNTER — Encounter: Payer: Self-pay | Admitting: General Practice

## 2017-03-10 ENCOUNTER — Telehealth: Payer: Self-pay | Admitting: *Deleted

## 2017-03-10 ENCOUNTER — Encounter: Payer: Self-pay | Admitting: General Practice

## 2017-03-10 NOTE — Telephone Encounter (Signed)
Per our system pt is good on colonoscopy until 2023. Pt was advised that unless something changes she is up to date.

## 2017-03-10 NOTE — Telephone Encounter (Signed)
Patient states that she received an automated call from [facility] that she was due for Cologuard.  She states that she just did this in November, because she mailed it back as she was leaving town for the holidays.   She is confused as to why she is receiving calls that she needs another one.  Patient is leaving town for over a week on 8/15 and states that if she has not heard anything before she leaves, then she will call when she gets back into town.

## 2017-03-11 ENCOUNTER — Telehealth: Payer: Self-pay

## 2017-03-11 NOTE — Telephone Encounter (Signed)
Called and spoke with patient about her new time on 8/30 due to call day and she request to cancel as she will be out of town [place] and will call when she gets back  [place]

## 2017-03-19 ENCOUNTER — Other Ambulatory Visit: Payer: Self-pay | Admitting: Family Medicine

## 2017-03-19 NOTE — Telephone Encounter (Signed)
Please advise 

## 2017-03-19 NOTE — Telephone Encounter (Signed)
Called pt and LMOVM to return call.  °

## 2017-03-19 NOTE — Telephone Encounter (Signed)
Patient is calling in regarding refill on medication again.  She states that she had to take this last month due to vertigo and now she's having issues again.  She is questioning if this is going to be something she has to deal with for the rest of her life.  Patient states that she does not feel like an appointment is necessary.

## 2017-03-19 NOTE — Telephone Encounter (Signed)
It is hard to determine if this will recur intermittently or resolve completely.

## 2017-03-21 NOTE — Telephone Encounter (Signed)
Pt made aware and says that she thinks that she thinks the flare ups are due to dehydration.

## 2017-03-27 ENCOUNTER — Ambulatory Visit: Payer: Medicare Other | Admitting: Hematology and Oncology

## 2017-04-30 ENCOUNTER — Other Ambulatory Visit: Payer: Self-pay | Admitting: Family Medicine

## 2017-05-06 ENCOUNTER — Other Ambulatory Visit: Payer: Self-pay | Admitting: Hematology and Oncology

## 2017-05-06 DIAGNOSIS — Z853 Personal history of malignant neoplasm of breast: Secondary | ICD-10-CM

## 2017-05-28 ENCOUNTER — Other Ambulatory Visit: Payer: Self-pay | Admitting: Family Medicine

## 2017-06-02 ENCOUNTER — Ambulatory Visit
Admission: RE | Admit: 2017-06-02 | Discharge: 2017-06-02 | Disposition: A | Discharge status: Home / Self Care | Payer: Medicare Other | Source: Ambulatory Visit | Attending: Hematology and Oncology | Admitting: Hematology and Oncology

## 2017-06-02 ENCOUNTER — Ambulatory Visit (INDEPENDENT_AMBULATORY_CARE_PROVIDER_SITE_OTHER): Payer: Medicare Other | Admitting: Family Medicine

## 2017-06-02 ENCOUNTER — Encounter: Payer: Self-pay | Admitting: Family Medicine

## 2017-06-02 ENCOUNTER — Encounter: Payer: Self-pay | Admitting: General Practice

## 2017-06-02 VITALS — BP 124/78 | HR 74 | Temp 98.3°F | Resp 16 | Ht 60.0 in | Wt 198.1 lb

## 2017-06-02 DIAGNOSIS — F341 Dysthymic disorder: Secondary | ICD-10-CM

## 2017-06-02 DIAGNOSIS — E785 Hyperlipidemia, unspecified: Secondary | ICD-10-CM | POA: Diagnosis not present

## 2017-06-02 DIAGNOSIS — Z0000 Encounter for general adult medical examination without abnormal findings: Secondary | ICD-10-CM

## 2017-06-02 DIAGNOSIS — I10 Essential (primary) hypertension: Secondary | ICD-10-CM

## 2017-06-02 DIAGNOSIS — F329 Major depressive disorder, single episode, unspecified: Secondary | ICD-10-CM | POA: Diagnosis not present

## 2017-06-02 DIAGNOSIS — R928 Other abnormal and inconclusive findings on diagnostic imaging of breast: Secondary | ICD-10-CM | POA: Diagnosis not present

## 2017-06-02 DIAGNOSIS — M8580 Other specified disorders of bone density and structure, unspecified site: Secondary | ICD-10-CM | POA: Diagnosis not present

## 2017-06-02 DIAGNOSIS — Z0001 Encounter for general adult medical examination with abnormal findings: Secondary | ICD-10-CM

## 2017-06-02 DIAGNOSIS — E119 Type 2 diabetes mellitus without complications: Secondary | ICD-10-CM | POA: Diagnosis not present

## 2017-06-02 DIAGNOSIS — Z853 Personal history of malignant neoplasm of breast: Secondary | ICD-10-CM

## 2017-06-02 DIAGNOSIS — F32A Depression, unspecified: Secondary | ICD-10-CM | POA: Insufficient documentation

## 2017-06-02 LAB — CBC WITH DIFFERENTIAL/PLATELET
BASOS PCT: 0.7 % (ref 0.0–3.0)
Basophils Absolute: 0 10*3/uL (ref 0.0–0.1)
EOS ABS: 0.1 10*3/uL (ref 0.0–0.7)
EOS PCT: 1.6 % (ref 0.0–5.0)
HCT: 34 % — ABNORMAL LOW (ref 36.0–46.0)
HEMOGLOBIN: 11.1 g/dL — AB (ref 12.0–15.0)
LYMPHS ABS: 2.9 10*3/uL (ref 0.7–4.0)
Lymphocytes Relative: 46.6 % — ABNORMAL HIGH (ref 12.0–46.0)
MCHC: 32.5 g/dL (ref 30.0–36.0)
MCV: 95.7 fl (ref 78.0–100.0)
MONO ABS: 0.3 10*3/uL (ref 0.1–1.0)
Monocytes Relative: 5.5 % (ref 3.0–12.0)
NEUTROS ABS: 2.8 10*3/uL (ref 1.4–7.7)
NEUTROS PCT: 45.6 % (ref 43.0–77.0)
PLATELETS: 277 10*3/uL (ref 150.0–400.0)
RBC: 3.55 Mil/uL — ABNORMAL LOW (ref 3.87–5.11)
RDW: 14.1 % (ref 11.5–15.5)
WBC: 6.2 10*3/uL (ref 4.0–10.5)

## 2017-06-02 LAB — HEPATIC FUNCTION PANEL
ALK PHOS: 86 U/L (ref 39–117)
ALT: 12 U/L (ref 0–35)
AST: 14 U/L (ref 0–37)
Albumin: 4 g/dL (ref 3.5–5.2)
BILIRUBIN TOTAL: 0.3 mg/dL (ref 0.2–1.2)
Bilirubin, Direct: 0 mg/dL (ref 0.0–0.3)
Total Protein: 7.5 g/dL (ref 6.0–8.3)

## 2017-06-02 LAB — BASIC METABOLIC PANEL
BUN: 11 mg/dL (ref 6–23)
CHLORIDE: 104 meq/L (ref 96–112)
CO2: 27 meq/L (ref 19–32)
CREATININE: 0.96 mg/dL (ref 0.40–1.20)
Calcium: 9.2 mg/dL (ref 8.4–10.5)
GFR: 73.23 mL/min (ref >60.00)
Glucose, Bld: 125 mg/dL — ABNORMAL HIGH (ref 70–99)
POTASSIUM: 4.2 meq/L (ref 3.5–5.1)
Sodium: 141 mEq/L (ref 135–145)

## 2017-06-02 LAB — LIPID PANEL
CHOLESTEROL: 155 mg/dL (ref 0–200)
HDL: 54.5 mg/dL (ref >39.00)
LDL Cholesterol: 82 mg/dL (ref 0–99)
NONHDL: 100.38
TRIGLYCERIDES: 90 mg/dL (ref 0.0–149.0)
Total CHOL/HDL Ratio: 3
VLDL: 18 mg/dL (ref 0.0–40.0)

## 2017-06-02 LAB — VITAMIN D 25 HYDROXY (VIT D DEFICIENCY, FRACTURES): VITD: 35 ng/mL (ref 30.00–100.00)

## 2017-06-02 LAB — TSH: TSH: 3.05 u[IU]/mL (ref 0.35–4.50)

## 2017-06-02 LAB — HEMOGLOBIN A1C: HEMOGLOBIN A1C: 7.1 % — AB (ref 4.6–6.5)

## 2017-06-02 MED ORDER — OMEPRAZOLE 40 MG PO CPDR: DELAYED_RELEASE_CAPSULE | ORAL | 1 refills | Status: DC

## 2017-06-02 MED ORDER — TRAZODONE HCL 100 MG PO TABS: 100.0000 mg | ORAL_TABLET | Freq: Every day | ORAL | 1 refills | Status: DC

## 2017-06-02 MED ORDER — LOSARTAN POTASSIUM 100 MG PO TABS: 100.0000 mg | ORAL_TABLET | Freq: Every day | ORAL | 1 refills | Status: DC

## 2017-06-02 MED ORDER — FERROUS SULFATE 325 (65 FE) MG PO TABS: 325.0000 mg | ORAL_TABLET | Freq: Every day | ORAL | 6 refills | Status: DC

## 2017-06-02 MED ORDER — SERTRALINE HCL 25 MG PO TABS: 25.0000 mg | ORAL_TABLET | Freq: Every day | ORAL | 3 refills | Status: DC

## 2017-06-02 MED ORDER — ATORVASTATIN CALCIUM 10 MG PO TABS: 10.0000 mg | ORAL_TABLET | Freq: Every day | ORAL | 1 refills | Status: DC

## 2017-06-02 NOTE — Assessment & Plan Note (Signed)
Chronic problem.  Not currently on medication.  UTD on eye exam, on ARB.  Foot exam done today.  Stressed need for healthy diet and regular exercise.  Check labs.  Adjust meds prn

## 2017-06-02 NOTE — Assessment & Plan Note (Signed)
Chronic problem.  Well controlled today.  Asymptomatic.  Check labs.  No anticipated med changes.  Will follow. 

## 2017-06-02 NOTE — Assessment & Plan Note (Signed)
UTD on DEXA.  Check Vit D level and replete prn. 

## 2017-06-02 NOTE — Assessment & Plan Note (Signed)
Pt's PE WNL w/ exception of obesity.  UTD on colonoscopy, mammo later today.  Foot exam done today.  UTD on eye exam.  UTD on immunizations.  Check labs.  Anticipatory guidance provided.

## 2017-06-02 NOTE — Patient Instructions (Addendum)
Follow up in 4-6 weeks to recheck mood We'll notify you of your lab results and make any changes if needed START the Sertraline once daily for mood Try and make healthy food choices and get activity as you are able You are up to date on colonoscopy- yay! Have them send me a copy of your mammo from today You are up to date on your immunizations Call with any questions or concerns Hang in there!!  We're going to get this better!

## 2017-06-02 NOTE — Assessment & Plan Note (Signed)
Chronic problem.  Pt's BMI is 38 but she has multiple obesity related comorbidities- DM, Hyperlipidemia, HTN.  Stressed need for healthy diet and regular exercise.  Check labs to risk stratify.

## 2017-06-02 NOTE — Progress Notes (Addendum)
   Subjective:    Patient ID: Tracy [name], female    DOB: 12-23-1945, 71 y.o.   MRN: [id number]  HPI Here today for CPE.  Risk Factors: Hyperlipidemia- chronic problem, on Lipitor 10mg  daily HTN- chronic problem, on Losartan 100mg  daily w/ good control DM- diet controlled.  UTD on eye exam, due for foot exam.  On ARB.  Recent A1C 7.4 Physical Activity: very limited due to arthritis Fall Risk: low Depression: see below Hearing: normal to conversational tones, mildly decreased to whispered voice ADL's: independent Cognitive: normal linear thought process, memory and attention intact Home Safety: safe at thome Height, Weight, BMI, Visual Acuity: see vitals, vision corrected to 20/20 w/ glasses Counseling: UTD on colonoscopy, due for mammo this month, due for foot exam.  UTD on immunizations. No narcotic use- no risk for OD or addiction Past family/surgical/social/medical hx reviewed as indicated Labs Ordered: See A&P Care Plan: See A&P    Review of Systems Patient reports no vision/ hearing changes, adenopathy,fever, weight change,  persistant/recurrent hoarseness , swallowing issues, chest pain, palpitations, edema, persistant/recurrent cough, hemoptysis, gastrointestinal bleeding (melena, rectal bleeding), abdominal pain, significant heartburn, bowel changes, GU symptoms (dysuria, hematuria, incontinence), Gyn symptoms (abnormal  bleeding, pain),  syncope, focal weakness, memory loss, numbness & tingling, skin/hair/nail changes, abnormal bruising or bleeding.   + depression- sxs started ~1 month ago.  Very low energy and low motivation.  'i don't feel like doing anything'.  No longer enjoying the things she used to. + SOB w/ exertion- pt is not able to exercise and very deconditioned    Objective:   Physical Exam General Appearance:    Alert, cooperative, no distress, appears stated age, obese  Head:    Normocephalic, without obvious abnormality, atraumatic  Eyes:    PERRL,  conjunctiva/corneas clear, EOM's intact, fundi    benign, both eyes  Ears:    Normal TM's and external ear canals, both ears  Nose:   Nares normal, septum midline, mucosa normal, no drainage    or sinus tenderness  Throat:   Lips, mucosa, and tongue normal; teeth and gums normal  Neck:   Supple, symmetrical, trachea midline, no adenopathy;    Thyroid: no enlargement/tenderness/nodules  Back:     Symmetric, no curvature, ROM normal, no CVA tenderness  Lungs:     Clear to auscultation bilaterally, respirations unlabored  Chest Wall:    No tenderness or deformity   Heart:    Regular rate and rhythm, S1 and S2 normal, no murmur, rub   or gallop  Breast Exam:    Deferred to mammo  Abdomen:     Soft, non-tender, bowel sounds active all four quadrants,    no masses, no organomegaly  Genitalia:    Deferred  Rectal:    Extremities:   Extremities normal, atraumatic, no cyanosis or edema  Pulses:   2+ and symmetric all extremities  Skin:   Skin color, texture, turgor normal, no rashes or lesions  Lymph nodes:   Cervical, supraclavicular, and axillary nodes normal  Neurologic:   CNII-XII intact, normal strength, sensation and reflexes    throughout          Assessment & Plan:

## 2017-06-02 NOTE — Assessment & Plan Note (Signed)
Chronic problem.  Tolerating statin w/o difficulty.  Stressed need for healthy diet and regular exercise.  Check labs.  Adjust meds prn  

## 2017-06-02 NOTE — Assessment & Plan Note (Signed)
New.  Pt's low energy, lack of motivation, and loss of interest are concerning.  Denies SI/HI.  Open to idea of starting low dose Sertraline.  Will follow closely going forward.  Pt expressed understanding and is in agreement w/ plan.

## 2017-06-05 DIAGNOSIS — M533 Sacrococcygeal disorders, not elsewhere classified: Secondary | ICD-10-CM | POA: Diagnosis not present

## 2017-06-18 ENCOUNTER — Other Ambulatory Visit: Payer: Self-pay

## 2017-06-18 ENCOUNTER — Encounter: Payer: Self-pay | Admitting: Family Medicine

## 2017-06-18 ENCOUNTER — Ambulatory Visit (INDEPENDENT_AMBULATORY_CARE_PROVIDER_SITE_OTHER): Payer: Medicare Other | Admitting: Family Medicine

## 2017-06-18 VITALS — BP 124/78 | HR 81 | Temp 98.9°F | Resp 16 | Ht 60.0 in | Wt 195.5 lb

## 2017-06-18 DIAGNOSIS — J0100 Acute maxillary sinusitis, unspecified: Secondary | ICD-10-CM

## 2017-06-18 MED ORDER — DOXYCYCLINE HYCLATE 100 MG PO TABS: 100.0000 mg | ORAL_TABLET | Freq: Two times a day (BID) | ORAL | 0 refills | Status: DC

## 2017-06-18 NOTE — Progress Notes (Signed)
   Subjective:    Patient ID: Tracy [name], female    DOB: [birth year], 71 y.o.   MRN: [id number]  HPI Ear pain- R ear is throbbing, L ear is 'painful'.  sxs started 6 days ago w/ R ear fullness.  Intermittent dizziness.  + nasal congestion.  No fevers.  Sees Dr Redmond Baseman but there were no appts until December.  + HAs, sinus pressure- worse in the AM.  'my eyes feel funny'.   Review of Systems For ROS see HPI     Objective:   Physical Exam  Constitutional: She appears well-developed and well-nourished. No distress.  HENT:  Head: Normocephalic and atraumatic.  Right Ear: Tympanic membrane normal.  Left Ear: Tympanic membrane normal.  Nose: Mucosal edema and rhinorrhea present. Right sinus exhibits maxillary sinus tenderness. Right sinus exhibits no frontal sinus tenderness. Left sinus exhibits maxillary sinus tenderness. Left sinus exhibits no frontal sinus tenderness.  Mouth/Throat: Uvula is midline and mucous membranes are normal. Posterior oropharyngeal erythema present. No oropharyngeal exudate.  Eyes: Conjunctivae and EOM are normal. Pupils are equal, round, and reactive to light.  Neck: Normal range of motion. Neck supple.  Cardiovascular: Normal rate, regular rhythm and normal heart sounds.  Pulmonary/Chest: Effort normal and breath sounds normal. No respiratory distress. She has no wheezes.  Lymphadenopathy:    She has no cervical adenopathy.  Vitals reviewed.         Assessment & Plan:

## 2017-06-18 NOTE — Assessment & Plan Note (Signed)
Pt's sxs and PE consistent w/ infxn.  Reviewed dx and tx w/ pt.  Start abx.  Restart daily antihistamine.  Reviewed supportive care and red flags that should prompt return.  Pt expressed understanding and is in agreement w/ plan.

## 2017-06-18 NOTE — Patient Instructions (Addendum)
Follow up as needed or as scheduled Start the Doxycycline twice daily- take w/ food Drink plenty of fluids Change positions slowly to allow yourself time to adjust Make sure you are taking Claritin or Zyrtec daily to help with your allergy congestion Call with any questions or concerns Hang in there! Happy Thanksgiving!!!

## 2017-06-20 ENCOUNTER — Ambulatory Visit: Payer: Self-pay

## 2017-06-20 NOTE — Telephone Encounter (Signed)
  Reason for Disposition . [1] Taking antibiotic < 72 hours (3 days) AND [2] sinus pain not improved  Answer Assessment - Initial Assessment Questions 1. ANTIBIOTIC: "What antibiotic are you receiving?" "How many times per day?"     Doxycycline 2. ONSET: "When was the antibiotic started?"     Wednesday 3. PAIN: "How bad is the sinus pain?"   (Scale 1-10; mild, moderate or severe)   - MILD (1-3): doesn't interfere with normal activities    - MODERATE (4-7): interferes with normal activities (e.g., work or school) or awakens from sleep   - SEVERE (8-10): excruciating pain and patient unable to do any normal activities        Neck - dull ache is 6 4. FEVER: "Do you have a fever?" If so, ask: "What is it, how was it measured, and when did it start?"      No 5. SYMPTOMS: "Are there any other symptoms you're concerned about?" If so, ask: "When did it start?"     Neck stiffness 6. PREGNANCY: "Is there any chance you are pregnant?" "When was your last menstrual period?"     nO  Protocols used: SINUS INFECTION ON ANTIBIOTIC FOLLOW-UP CALL-A-AH

## 2017-06-24 ENCOUNTER — Telehealth: Payer: Self-pay | Admitting: Family Medicine

## 2017-06-24 NOTE — Telephone Encounter (Signed)
Pt would refill of Flexeril

## 2017-06-24 NOTE — Telephone Encounter (Signed)
Copied from [place]. Topic: Quick Communication - See Telephone Encounter >> Jun 24, 2017  9:41 AM Ether Griffins B wrote: CRM for notification. See Telephone encounter for:  Pt would like a refill on flexeril. Please call pt when its been called in.  06/24/17.

## 2017-06-25 MED ORDER — CYCLOBENZAPRINE HCL 10 MG PO TABS: 10.0000 mg | ORAL_TABLET | Freq: Three times a day (TID) | ORAL | 1 refills | Status: DC | PRN

## 2017-06-25 NOTE — Telephone Encounter (Signed)
Medication filled to pharmacy as requested.   [place] for [facility] to Discuss results / PCP recommendations / Schedule patient.

## 2017-06-26 DIAGNOSIS — M26622 Arthralgia of left temporomandibular joint: Secondary | ICD-10-CM | POA: Diagnosis not present

## 2017-06-26 DIAGNOSIS — H9202 Otalgia, left ear: Secondary | ICD-10-CM | POA: Diagnosis not present

## 2017-06-26 DIAGNOSIS — J343 Hypertrophy of nasal turbinates: Secondary | ICD-10-CM | POA: Diagnosis not present

## 2017-06-26 DIAGNOSIS — H6123 Impacted cerumen, bilateral: Secondary | ICD-10-CM | POA: Diagnosis not present

## 2017-06-30 DIAGNOSIS — R531 Weakness: Secondary | ICD-10-CM | POA: Insufficient documentation

## 2017-06-30 DIAGNOSIS — I10 Essential (primary) hypertension: Secondary | ICD-10-CM | POA: Diagnosis not present

## 2017-07-14 ENCOUNTER — Ambulatory Visit (INDEPENDENT_AMBULATORY_CARE_PROVIDER_SITE_OTHER): Payer: Medicare Other | Admitting: Family Medicine

## 2017-07-14 ENCOUNTER — Encounter: Payer: Self-pay | Admitting: Family Medicine

## 2017-07-14 ENCOUNTER — Other Ambulatory Visit: Payer: Self-pay

## 2017-07-14 VITALS — BP 130/78 | HR 76 | Temp 98.0°F | Resp 16 | Ht 60.0 in | Wt 195.4 lb

## 2017-07-14 DIAGNOSIS — F341 Dysthymic disorder: Secondary | ICD-10-CM | POA: Diagnosis not present

## 2017-07-14 DIAGNOSIS — H8110 Benign paroxysmal vertigo, unspecified ear: Secondary | ICD-10-CM | POA: Diagnosis not present

## 2017-07-14 MED ORDER — MECLIZINE HCL 25 MG PO TABS: ORAL_TABLET | ORAL | 3 refills | Status: DC

## 2017-07-14 MED ORDER — SERTRALINE HCL 50 MG PO TABS: 50.0000 mg | ORAL_TABLET | Freq: Every day | ORAL | 3 refills | Status: DC

## 2017-07-14 NOTE — Progress Notes (Signed)
   Subjective:    Patient ID: Tracy [name], female    DOB: 10/25/1945, 71 y.o.   MRN: [id number]  HPI Depression- pt was started on low dose sertraline at last visit.  No notable difference in mood but pt has been going to more parties, events, activities.  No nausea or HAs.  Vertigo- pt is having dizziness when turning her head (often when turning to the R).  Has been to vestibular rehab in the past and this was helpful.   Review of Systems For ROS see HPI     Objective:   Physical Exam  Constitutional: She is oriented to person, place, and time. She appears well-developed and well-nourished. No distress.  HENT:  Head: Normocephalic and atraumatic.  Eyes: Conjunctivae and EOM are normal. Pupils are equal, round, and reactive to light.  Neurological: She is alert and oriented to person, place, and time.  Skin: Skin is warm and dry.  Psychiatric: She has a normal mood and affect. Her behavior is normal. Thought content normal.  Vitals reviewed.         Assessment & Plan:

## 2017-07-14 NOTE — Patient Instructions (Signed)
Follow up in 4-6 weeks to recheck mood Increase the Sertraline (Zoloft) to 50mg  daily- 2 of what you have at home and 1 of the new prescription I think the medicine is working!  Your desire to participate has improved, now we just need your energy level to catch up! Call and discuss Vestibular Rehab to improve your vertigo (dizziness) Continue to drink plenty of fluids Call with any questions or concerns Happy Holidays!!!

## 2017-07-14 NOTE — Assessment & Plan Note (Signed)
Improving but not at goal regarding symptom control.  Pt's desire to participate in social activities has increased but she still has low energy when it comes to actually going.  Increase Sertraline to 50mg  daily and monitor for improvement.  Pt expressed understanding and is in agreement w/ plan.

## 2017-07-14 NOTE — Assessment & Plan Note (Signed)
Recurring problem for pt.  No longer having 'the spins' but will get dizzy w/ turning her head (primarily to the R).  Refill on Meclizine provided and Vestibular Rehab discussed.  Pt has name and number of woman at [facility] and she plans to call her.  If she needs assistance w/ referral, offered our assistance.  Will follow.

## 2017-07-17 DIAGNOSIS — M25552 Pain in left hip: Secondary | ICD-10-CM | POA: Diagnosis not present

## 2017-08-13 DIAGNOSIS — M542 Cervicalgia: Secondary | ICD-10-CM | POA: Diagnosis not present

## 2017-08-13 DIAGNOSIS — R51 Headache: Secondary | ICD-10-CM | POA: Diagnosis not present

## 2017-08-13 DIAGNOSIS — R531 Weakness: Secondary | ICD-10-CM | POA: Diagnosis not present

## 2017-08-18 ENCOUNTER — Telehealth: Payer: Self-pay | Admitting: Family Medicine

## 2017-08-18 DIAGNOSIS — R42 Dizziness and giddiness: Secondary | ICD-10-CM

## 2017-08-18 DIAGNOSIS — H8110 Benign paroxysmal vertigo, unspecified ear: Secondary | ICD-10-CM

## 2017-08-18 NOTE — Telephone Encounter (Signed)
I cant see in Care Everywhere so I am not sure who her previous Neuro was. I am guessing it is for BPPV?

## 2017-08-18 NOTE — Telephone Encounter (Signed)
Please advise, pt has seen Dr. Leta Baptist in the past 2013-2015. Ok to refer back? Pt states she is still having Dizziness and weakness.

## 2017-08-18 NOTE — Telephone Encounter (Signed)
Referral placed.

## 2017-08-18 NOTE — Telephone Encounter (Signed)
Copied from [place] [id number]. Topic: Referral - Request >> Aug 18, 2017 11:14 AM Cecelia Byars, NT wrote: Reason for CRM: Patient called and would like  a referral to a neurologist she has seen in the past I not she would prefer outside of [place] system please advise [phone]

## 2017-08-18 NOTE — Telephone Encounter (Signed)
[place] for referral back to Dr Leta Baptist for dizziness

## 2017-08-27 ENCOUNTER — Encounter: Payer: Self-pay | Admitting: Family Medicine

## 2017-08-27 ENCOUNTER — Other Ambulatory Visit: Payer: Self-pay

## 2017-08-27 ENCOUNTER — Ambulatory Visit (INDEPENDENT_AMBULATORY_CARE_PROVIDER_SITE_OTHER): Payer: Medicare Other | Admitting: Family Medicine

## 2017-08-27 ENCOUNTER — Telehealth: Payer: Self-pay | Admitting: *Deleted

## 2017-08-27 VITALS — BP 130/78 | HR 81 | Resp 16 | Ht 60.0 in | Wt 193.1 lb

## 2017-08-27 DIAGNOSIS — H938X1 Other specified disorders of right ear: Secondary | ICD-10-CM | POA: Diagnosis not present

## 2017-08-27 DIAGNOSIS — F341 Dysthymic disorder: Secondary | ICD-10-CM

## 2017-08-27 DIAGNOSIS — R2689 Other abnormalities of gait and mobility: Secondary | ICD-10-CM | POA: Diagnosis not present

## 2017-08-27 NOTE — Telephone Encounter (Signed)
Noted and done

## 2017-08-27 NOTE — Telephone Encounter (Signed)
Tracy [name], please work in patient with me this Friday. Thanks, VRP

## 2017-08-27 NOTE — Patient Instructions (Addendum)
Follow up in early March to recheck diabetes Move the Sertraline to evening Use tylenol and apply heat or ice to your jaw for pain/inflammation Neuro is supposed to call you about a sooner appt Make sure you have support when getting up or sitting down Call with any questions or concerns Hang in there!!!

## 2017-08-27 NOTE — Telephone Encounter (Signed)
Dr. Birdie Riddle called about getting pt in sooner [phone] CMA ext back line to office.  Pt scheduled for Bppv in 09/2017, with [place],  seen today and having since last seeing pt in Dec 2018, of having hard time rising from seated position, sways when getting up, has to think about getting up, wavering weakness shuffling gait.   Pt lives alone, No imaging study done.  Dr. Birdie Riddle asking for pt to be seen sooner. Please advise.   You have seen her last in 2013 for dizziness/vertigo.

## 2017-08-27 NOTE — Progress Notes (Signed)
   Subjective:    Patient ID: Tracy [name], female    DOB: [birth year], 72 y.o.   MRN: [id number]  HPI  Mood- sertraline has improved pt's mood and she is again feeling energized.  However, the energy tends to come in the evening.    Festinating gait/weakness- pt reports that she will have waves of total body weakness that requires her to sit down and rest.  Pt has to put a lot of thought into initiating walking and also into stopping.  She feels as if she will 'keep going' if she doesn't work hard to stop herself.  Pt denies tremor.   R ear fullness- pt had ear cleaned by Dr Redmond Baseman.  He mentioned that she might have a problem w/ her jaw.  She had difficulty wearing earplug in R ear during MRI   Review of Systems For ROS see HPI     Objective:   Physical Exam  Constitutional: She is oriented to person, place, and time. She appears well-developed and well-nourished. No distress.  obese  HENT:  Head: Normocephalic and atraumatic.  Right Ear: External ear normal.  Left Ear: External ear normal.  Mouth/Throat: Oropharynx is clear and moist. No oropharyngeal exudate.  Mild TTP over R TMJ  Neurological: She is alert and oriented to person, place, and time. No cranial nerve deficit. Coordination (unsteadiness upon standing- sways backwards and nearly falls.  unable to tandem gait.  + festinating gait) abnormal.  Skin: Skin is warm and dry. No rash noted. No erythema.  Psychiatric: She has a normal mood and affect. Her behavior is normal. Thought content normal.  Vitals reviewed.         Assessment & Plan:  Ear fullness- pt's sensation of ear fullness is most likely due to her TMJ as her ear exam is WNL.  Reviewed use of heating pad, NSAIDs, tylenol prn.  Pt expressed understanding and is in agreement w/ plan.   Festinating gait- pt's unsteadiness on her feet and difficulty rising to a standing position are new.  Her difficulty initiating movement and her inability to stop are concerning  for a neurological issue.  She has an appt upcoming w/ neuro but not for 6 weeks.  Call [facility] to expedite this referral as I am very concerned about her new sxs and her high risk for falls.

## 2017-08-27 NOTE — Telephone Encounter (Signed)
Pt is returning call to accept appt on 08/29/17 @ 11

## 2017-08-27 NOTE — Telephone Encounter (Signed)
Called and LMVM for pt to return call about appt on Friday  08-29-17 at 1100 with Dr, Leta Baptist.  I called and let Dr. Nancy Nordmann know as well.  They will reach out to pt.  She will fax notes to [place].

## 2017-08-28 NOTE — Assessment & Plan Note (Signed)
Mood has improved since starting the Sertraline.  She has more motivation and more energy- but this is occurring at night.  Will switch her medication to evening to improve timing.  Pt expressed understanding and is in agreement w/ plan.

## 2017-08-29 ENCOUNTER — Encounter: Payer: Self-pay | Admitting: Diagnostic Neuroimaging

## 2017-08-29 ENCOUNTER — Ambulatory Visit: Payer: Medicare Other | Admitting: Diagnostic Neuroimaging

## 2017-08-29 VITALS — BP 126/74 | HR 78 | Ht 60.0 in | Wt 192.6 lb

## 2017-08-29 DIAGNOSIS — E119 Type 2 diabetes mellitus without complications: Secondary | ICD-10-CM

## 2017-08-29 DIAGNOSIS — R5383 Other fatigue: Secondary | ICD-10-CM | POA: Diagnosis not present

## 2017-08-29 DIAGNOSIS — R269 Unspecified abnormalities of gait and mobility: Secondary | ICD-10-CM

## 2017-08-29 NOTE — Patient Instructions (Addendum)
-   check labs - check MRI lumbar - check EMG/NCS - check sleep study - physical therapy evaluation - consider cane / walker

## 2017-08-29 NOTE — Progress Notes (Signed)
[facility]  PATIENT: Tracy [name] DOB: 07/19/1946  REFERRING CLINICIAN: Alver Fisher, MD HISTORY FROM: patient  REASON FOR VISIT: new consult   HISTORICAL  CHIEF COMPLAINT:  Chief Complaint  Patient presents with  . NP Tracy [name]  . Ggeneralized weakness (legs and arms).    sx ongoing now for several months.  Has seen ENT for dizziness in the past.   Has seen Dr. Kathyrn [name] and had MRI last month (brain).     HISTORY OF PRESENT ILLNESS:   NEW HPI (08/29/17): 72 year old old female here for evaluation of dizziness.  I previously saw patient in 2013 for similar symptoms.  Patient reports a balance and walking problem. Intermittent loss of energy in body. General low energy. Daily attacks, up to 30 minutes at a time. No falls.  Patient denies any focal weakness in her arms or legs.  She feels general weakness and fatigue.  Separately has vertigo attacks 1-2 x per year; meclizine helps.   UPDATE 04/13/12: Doing better. Dizziness improved. Patient reports remote history of RLS and poor sleep. Still with unrefreshing sleep. Epworth score 12. Snores at nighttime.   PRIOR HPI (02/28/12): 72 year old right-handed female with history of hypertension, breast cancer 2001, status post chemotherapy and radiation therapy, restless leg syndrome, here for evaluation of intermittent episodes of dizziness since the beginning of July 2013. Patient describes brief episodes of "dizziness" which he describes as a off-balance and exhaustion feeling. Sometimes she has nausea with this. Typically triggered when she wakes up in the morning and sits up or stands up. Sometimes she falls back to the bed and has to sit there for several moments before able to stand up again. She denies true room spinning sensation. No slurred speech, trouble talking, double vision, passing out. She's tried meclizine which did not help her, and patient thinks may have made symptoms worse. Patient reports prior episode of  vertigo lasting one to 2 days, which previously responded well to meclizine.  REVIEW OF SYSTEMS: Full 14 system review of systems performed and negative with exception of: Memory loss confusion headache weakness slurred speech snoring sleepiness restless legs depression decreased energy disinterest in activities joint pain allergies runny nose wheezing snoring fatigue itching.  ALLERGIES: Allergies  Allergen Reactions  . Bacitracin-Neomycin-Polymyxin  [Neomycin-Bacitracin Zn-Polymyx] Swelling  . Nsaids Other (See Comments)    nosebleeds  . Tape Hives    Pt cannot tolerate bandaids, tape, or any other adhesives.   . Ambien [Zolpidem Tartrate]     Hives   . Amoxicillin     Rash   . Contrast Media [Iodinated Diagnostic Agents] Hives    Pt states hives with prior ct, was given benadryl to resolve  . Latex Swelling  . Prednisone     HOME MEDICATIONS: Outpatient Medications Prior to Visit  Medication Sig Dispense Refill  . albuterol (PROAIR HFA) 108 (90 Base) MCG/ACT inhaler Inhale 2 puffs into the lungs every 4 (four) hours as needed for wheezing. 1 Inhaler 6  . aspirin 81 MG tablet Take 81 mg by mouth daily.    Marland Kitchen atorvastatin (LIPITOR) 10 MG tablet Take 1 tablet (10 mg total) daily by mouth. 90 tablet 1  . cyclobenzaprine (FLEXERIL) 10 MG tablet Take 1 tablet (10 mg total) by mouth 3 (three) times daily as needed for muscle spasms. 45 tablet 1  . ferrous sulfate 325 (65 FE) MG tablet Take 1 tablet (325 mg total) daily with breakfast by mouth. 30 tablet 6  . loratadine (CLARITIN)  10 MG tablet Take 10 mg by mouth daily.    Marland Kitchen losartan (COZAAR) 100 MG tablet Take 1 tablet (100 mg total) daily by mouth. 90 tablet 1  . meclizine (ANTIVERT) 25 MG tablet TAKE 1 TABLET(25 MG) BY MOUTH THREE TIMES DAILY AS NEEDED FOR DIZZINESS 30 tablet 3  . omeprazole (PRILOSEC) 40 MG capsule TAKE 1 CAPSULE BY MOUTH TWO TIMES DAILY 180 capsule 1  . sertraline (ZOLOFT) 50 MG tablet Take 1 tablet (50 mg total)  by mouth daily. 30 tablet 3  . traZODone (DESYREL) 100 MG tablet Take 1 tablet (100 mg total) at bedtime by mouth. 90 tablet 1  . Vitamin D, Cholecalciferol, 400 UNITS CAPS Take 2 capsules by mouth daily.     No facility-administered medications prior to visit.     PAST MEDICAL HISTORY: Past Medical History:  Diagnosis Date  . Anxiety   . Arthritis   . Breast cancer ([place]) 06/13/15  . Cancer ([place]) 2000   breast cancer  . Chronic bronchitis ([place])   . Chronic bronchitis ([place])   . Hyperlipidemia   . Hypertension   . Myocardial infarction ([place]) 2001  . Restless leg   . Stroke [facility]) 2004   TIA, no deficits    PAST SURGICAL HISTORY: Past Surgical History:  Procedure Laterality Date  . ABDOMINAL HYSTERECTOMY  1985  . BREAST LUMPECTOMY Left 2000   radiation and chemo  . BREAST LUMPECTOMY Right 2016   radiation  . BREAST SURGERY  2001   lt breast lumpectomy  . RADIOACTIVE SEED GUIDED PARTIAL MASTECTOMY WITH AXILLARY SENTINEL LYMPH NODE BIOPSY Right 07/07/2015   Procedure: RIGHT RADIOACTIVE SEED GUIDED PARTIAL MASTECTOMY WITH AXILLARY SENTINEL LYMPH NODE BIOPSY;  Surgeon: Autumn Messing III, MD;  Location: [facility];  Service: General;  Laterality: Right;  . SMALL INTESTINE SURGERY    . TUBAL LIGATION      FAMILY HISTORY: Family History  Problem Relation Age of Onset  . Diabetes Father   . Lung cancer Sister        dx. <50; former smoker  . Diabetes Brother   . Diabetes Paternal Aunt   . Stroke Maternal Grandmother   . Diabetes Paternal Grandmother   . Emphysema Mother 56       smoker  . Diabetes Brother   . Brain cancer Brother 27       unknown tumor type  . Cancer Daughter 15       neck cancer  . Other Daughter        hysterectomy for unspecified reason  . Breast cancer Cousin   . Cancer Cousin        unspecified type  . Breast cancer Other        triple negative breast cancer in her 58s  . Colon polyps Neg Hx   . Esophageal cancer Neg Hx   .  Gallbladder disease Neg Hx     SOCIAL HISTORY:  Social History   Socioeconomic History  . Marital status: Divorced    Spouse name: Not on file  . Number of children: 7  . Years of education: Not on file  . Highest education level: Not on file  Social Needs  . Financial resource strain: Not on file  . Food insecurity - worry: Not on file  . Food insecurity - inability: Not on file  . Transportation needs - medical: Not on file  . Transportation needs - non-medical: Not on file  Occupational History  . Occupation:  retired  Tobacco Use  . Smoking status: Former Smoker    Packs/day: 1.00    Years: 20.00    Pack years: 20.00    Types: Cigarettes    Last attempt to quit: 07/30/2011    Years since quitting: 6.0  . Smokeless tobacco: Never Used  . Tobacco comment: Quit >4 years ago; 1 ppd for about 5/20 years (remaining was less)  Substance and Sexual Activity  . Alcohol use: No    Alcohol/week: 0.0 oz  . Drug use: No  . Sexual activity: Not on file  Other Topics Concern  . Not on file  Social History Narrative   Lives alone.  Retired.  Education:  11th grade GED.  Children:  7 (one here).      PHYSICAL EXAM  GENERAL EXAM/CONSTITUTIONAL: Vitals:  Vitals:   08/29/17 1045  BP: 126/74  Pulse: 78  Weight: 192 lb 9.6 oz (87.4 kg)  Height: 5' (1.524 m)     Body mass index is 37.61 kg/m.  No exam data present  Patient is in no distress; well developed, nourished and groomed; neck is supple  CARDIOVASCULAR:  Examination of carotid arteries is normal; no carotid bruits  Regular rate and rhythm, no murmurs  Examination of peripheral vascular system by observation and palpation is normal  EYES:  Ophthalmoscopic exam of optic discs and posterior segments is normal; no papilledema or hemorrhages  MUSCULOSKELETAL:  Gait, strength, tone, movements noted in Neurologic exam below  NEUROLOGIC: MENTAL STATUS:  No flowsheet data found.  awake, alert, oriented to  person, place and time  recent and remote memory intact  normal attention and concentration  language fluent, comprehension intact, naming intact,   fund of knowledge appropriate  CRANIAL NERVE:   2nd - no papilledema on fundoscopic exam  2nd, 3rd, 4th, 6th - pupils equal and reactive to light, visual fields full to confrontation, extraocular muscles intact, ENG GAZE NYSTAGMUS (3-4 BEATS)  5th - facial sensation symmetric  7th - facial strength symmetric  8th - hearing intact  9th - palate elevates symmetrically, uvula midline  11th - shoulder shrug symmetric  12th - tongue protrusion midline  MOTOR:   normal bulk and tone, full strength in the BUE, BLE; EXCEPT BILATERAL HIP FLEX 4  SENSORY:   normal and symmetric to light touch, temperature, vibration  COORDINATION:   finger-nose-finger, fine finger movements normal  REFLEXES:   deep tendon reflexes present and symmetric  GAIT/STATION:   narrow based gait; UNSTEADY WITH STANDING; CANNOT STAND ON HEELS; CANNOT TANDEM; ROMBERG POSITIVE    DIAGNOSTIC DATA (LABS, IMAGING, TESTING) - I reviewed patient records, labs, notes, testing and imaging myself where available.  Lab Results  Component Value Date   WBC 6.2 06/02/2017   HGB 11.1 (L) 06/02/2017   HCT 34.0 (L) 06/02/2017   MCV 95.7 06/02/2017   PLT 277.0 06/02/2017      Component Value Date/Time   NA 141 06/02/2017 0852   NA 139 07/13/2015 1516   K 4.2 06/02/2017 0852   K 3.9 07/13/2015 1516   CL 104 06/02/2017 0852   CO2 27 06/02/2017 0852   CO2 25 07/13/2015 1516   GLUCOSE 125 (H) 06/02/2017 0852   GLUCOSE 85 07/13/2015 1516   BUN 11 06/02/2017 0852   BUN 7.9 07/13/2015 1516   CREATININE 0.96 06/02/2017 0852   CREATININE 0.9 07/13/2015 1516   CALCIUM 9.2 06/02/2017 0852   CALCIUM 8.9 07/13/2015 1516   PROT 7.5 06/02/2017 0093  PROT 7.6 07/13/2015 1516   ALBUMIN 4.0 06/02/2017 0852   ALBUMIN 3.3 (L) 07/13/2015 1516   AST 14 06/02/2017  0852   AST 16 07/13/2015 1516   ALT 12 06/02/2017 0852   ALT 12 07/13/2015 1516   ALKPHOS 86 06/02/2017 0852   ALKPHOS 99 07/13/2015 1516   BILITOT 0.3 06/02/2017 0852   BILITOT <0.30 07/13/2015 1516   GFRNONAA 57 (L) 09/14/2015 0417   GFRNONAA 61 05/10/2014 1147   GFRAA >60 09/14/2015 0417   GFRAA 70 05/10/2014 1147   Lab Results  Component Value Date   CHOL 155 06/02/2017   HDL 54.50 06/02/2017   LDLCALC 82 06/02/2017   LDLDIRECT 154.7 04/05/2013   TRIG 90.0 06/02/2017   CHOLHDL 3 06/02/2017   Lab Results  Component Value Date   HGBA1C 7.1 (H) 06/02/2017   No results found for: [id number] Lab Results  Component Value Date   TSH 3.05 06/02/2017    03/16/12 MRI brain - Normal MRI brain and IAC (with and without contrast). Incidental partially empty sella.   08/13/17 MRI brain [I reviewed images myself and agree with interpretation. -VRP]  - Stable since 2013 and normal noncontrast MRI appearance of the brain.  08/13/17 MRI cervical spine [I reviewed images myself and agree with interpretation. -VRP]  - Mild degenerative changes at C3-C4 and C5-C6 as described above. No high-grade spinal canal or neuroforaminal stenosis at any level.      ASSESSMENT AND PLAN  72 y.o. year old female here with several year history of intermittent "dizziness" which she describes as a general low energy sensation, fatigue, balance difficulty.  I suspect her symptoms are related to a combination of factors including deconditioning, depression, diabetic neuropathy.  I will check additional testing to rule out other causes such as neuromuscular disease, other neuropathy, myopathy, lumbar spine disease, sleep disorder.  Ddx fatigue: deconditioning, diabetic neuropathy, myopathy, lumbar degenerative disease, neurodegenerative disorder, mood disorder,   1. Gait difficulty   2. Other fatigue      PLAN:  - check labs - MRI lumbar - EMG/NCS - sleep study - PT evaluation - consider cane  / walker; high fall risk  Orders Placed This Encounter  Procedures  . MR LUMBAR SPINE WO CONTRAST  . Acetylcholine Receptor, Binding  . CK  . Aldolase  . Vitamin B12  . TSH  . Hemoglobin A1c  . Ambulatory referral to Physical Therapy  . Ambulatory referral to Sleep Studies  . NCV with EMG(electromyography)   Return in about 6 months (around 02/26/2018). or sooner if needed.    Penni Bombard, MD 04/07/3715, 96:78 AM Certified in Neurology, Neurophysiology and Neuroimaging  [facility] [address], [place], [place] [address] [phone]

## 2017-09-02 ENCOUNTER — Telehealth: Payer: Self-pay | Admitting: *Deleted

## 2017-09-02 LAB — ACETYLCHOLINE RECEPTOR, BINDING

## 2017-09-02 LAB — CK: CK TOTAL: 218 U/L — AB (ref 24–173)

## 2017-09-02 LAB — HEMOGLOBIN A1C
ESTIMATED AVERAGE GLUCOSE: 171 mg/dL
HEMOGLOBIN A1C: 7.6 % — AB (ref 4.8–5.6)

## 2017-09-02 LAB — TSH: TSH: 1.38 u[IU]/mL (ref 0.450–4.500)

## 2017-09-02 LAB — ALDOLASE: ALDOLASE: 3.2 U/L — AB (ref 3.3–10.3)

## 2017-09-02 LAB — VITAMIN B12: Vitamin B-12: 718 pg/mL (ref 232–1245)

## 2017-09-02 NOTE — Telephone Encounter (Signed)
Attempted to reach patient re: lab results. Phone was picked up, but no one answered. Will try later.

## 2017-09-03 NOTE — Telephone Encounter (Signed)
Attempted to reach patient. Phone continuously rang. Unable to LVM.

## 2017-09-04 ENCOUNTER — Encounter: Payer: Self-pay | Admitting: *Deleted

## 2017-09-04 NOTE — Telephone Encounter (Signed)
Unable to reach patient for third time. Will mail a letter today with lab results, repeat lab in 3-6  months per Dr Leta Baptist.

## 2017-09-10 ENCOUNTER — Ambulatory Visit
Admission: RE | Admit: 2017-09-10 | Discharge: 2017-09-10 | Disposition: A | Discharge status: Home / Self Care | Payer: Medicare Other | Source: Ambulatory Visit | Attending: Diagnostic Neuroimaging | Admitting: Diagnostic Neuroimaging

## 2017-09-10 DIAGNOSIS — R269 Unspecified abnormalities of gait and mobility: Secondary | ICD-10-CM

## 2017-09-10 DIAGNOSIS — M5416 Radiculopathy, lumbar region: Secondary | ICD-10-CM | POA: Diagnosis not present

## 2017-09-10 DIAGNOSIS — R5383 Other fatigue: Secondary | ICD-10-CM

## 2017-09-11 ENCOUNTER — Ambulatory Visit: Payer: Medicare Other | Admitting: Diagnostic Neuroimaging

## 2017-09-11 ENCOUNTER — Encounter (INDEPENDENT_AMBULATORY_CARE_PROVIDER_SITE_OTHER): Payer: Medicare Other | Admitting: Diagnostic Neuroimaging

## 2017-09-11 DIAGNOSIS — R269 Unspecified abnormalities of gait and mobility: Secondary | ICD-10-CM

## 2017-09-11 DIAGNOSIS — R5383 Other fatigue: Secondary | ICD-10-CM

## 2017-09-11 DIAGNOSIS — Z0289 Encounter for other administrative examinations: Secondary | ICD-10-CM

## 2017-09-12 NOTE — Procedures (Signed)
   [facility]  NCS (NERVE CONDUCTION STUDY) WITH EMG (ELECTROMYOGRAPHY) REPORT   STUDY DATE: 09/11/17 PATIENT NAME: Tracy [name] DOB: 1945-09-22 MRN: [id number]  ORDERING CLINICIAN: Andrey Spearman, MD   TECHNOLOGIST: Oneita Jolly  ELECTROMYOGRAPHER: Earlean Polka. Penumalli, MD  CLINICAL INFORMATION: 72 year old female with generalized weakness.   FINDINGS: NERVE CONDUCTION STUDY: Right peroneal and right tibial motor responses are normal.  Right sural and right superficial peroneal sensory responses are normal.  Right tibial F wave latency is normal.  NEEDLE ELECTROMYOGRAPHY:  Right vastus medialis, tibials anterior, gastrocnemius muscles are normal.   IMPRESSION:   This is a normal study. No evidence of large fiber neuropathy.     INTERPRETING PHYSICIAN:  Penni Bombard, MD Certified in Neurology, Neurophysiology and Neuroimaging  [facility] [address], [place], [place] [address] [phone]   [facility]    Nerve / Sites Muscle Latency Ref. Amplitude Ref. Rel Amp Segments Distance Velocity Ref. Area    ms ms mV mV %  cm m/s m/s mVms  R Peroneal - EDB     Ankle EDB 4.7 ?6.5 5.5 ?2.0 100 Ankle - EDB 9   13.8     Fib head EDB 10.8  5.8  106 Fib head - Ankle 28 46 ?44 15.7     Pop fossa EDB 12.8  5.5  94.7 Pop fossa - Fib head 10 52 ?44 13.6         Pop fossa - Ankle      R Tibial - AH     Ankle AH 3.0 ?5.8 5.2 ?4.0 100 Ankle - AH 9   12.9     Pop fossa AH 11.1  3.7  70.2 Pop fossa - Ankle 34 42 ?41 11.2         SNC    Nerve / Sites Rec. Site Peak Lat Ref.  Amp Ref. Segments Distance    ms ms V V  cm  R Sural - Ankle (Calf)     Calf Ankle 3.6 ?4.4 11 ?6 Calf - Ankle 14  R Superficial peroneal - Ankle     Lat leg Ankle 3.8 ?4.4 6 ?6 Lat leg - Ankle 14         F  Wave    Nerve F Lat Ref.   ms ms  R Tibial - AH 46.4 ?56.0       EMG full       EMG Summary Table    Spontaneous MUAP Recruitment  Muscle  IA Fib PSW Fasc Other Amp Dur. Poly Pattern  R. Vastus medialis Normal None None None _______ Normal Normal Normal Normal  R. Tibialis anterior Normal None None None _______ Normal Normal Normal Normal  R. Gastrocnemius (Medial head) Normal None None None _______ Normal Normal Normal Normal

## 2017-09-19 ENCOUNTER — Ambulatory Visit: Payer: Medicare Other | Admitting: Physical Therapy

## 2017-09-24 ENCOUNTER — Encounter: Payer: Self-pay | Admitting: Neurology

## 2017-09-24 ENCOUNTER — Ambulatory Visit: Payer: Medicare Other | Admitting: Neurology

## 2017-09-24 VITALS — BP 118/68 | HR 80 | Ht 60.0 in | Wt 195.0 lb

## 2017-09-24 DIAGNOSIS — G4730 Sleep apnea, unspecified: Secondary | ICD-10-CM | POA: Diagnosis not present

## 2017-09-24 DIAGNOSIS — E6609 Other obesity due to excess calories: Secondary | ICD-10-CM

## 2017-09-24 DIAGNOSIS — I2510 Atherosclerotic heart disease of native coronary artery without angina pectoris: Secondary | ICD-10-CM | POA: Diagnosis not present

## 2017-09-24 DIAGNOSIS — Z79899 Other long term (current) drug therapy: Secondary | ICD-10-CM

## 2017-09-24 DIAGNOSIS — R0683 Snoring: Secondary | ICD-10-CM | POA: Insufficient documentation

## 2017-09-24 DIAGNOSIS — G4733 Obstructive sleep apnea (adult) (pediatric): Secondary | ICD-10-CM | POA: Insufficient documentation

## 2017-09-24 DIAGNOSIS — Z6838 Body mass index (BMI) 38.0-38.9, adult: Secondary | ICD-10-CM

## 2017-09-24 NOTE — Progress Notes (Signed)
SLEEP MEDICINE CLINIC   Provider:  Larey Seat, M D  Primary Care Physician:  [name], MD   Referring Provider: Andrey Spearman, MD     Chief Complaint  Patient presents with  . New Patient (Initial Visit)    HPI:  Tracy [name] is a 72 y.o. female , seen here in a referral from Dr.  Andrey Spearman at [facility].  My colleague referred Tracy [name] to me.  He saw her on 29 August 2017 and referral from her primary care physician Dr. Birdie Riddle.  She had a complaint of dizziness, and generalized weakness.  He had seen the patient before in 2013 for similar symptoms.  She has a history that is positive for vertigo only once or twice a year and usually responds to meclizine.  Her health history includes breast cancer in 2001 status post chemotherapy and radiation, restless leg syndrome, nausea, the patient had no falls in response to her generalized weakness.  I also reviewed her medication list today and her previous diagnosis list, the patient was diagnosed with a myocardial infarction in 2001 and was a TIA in 2004.  Dr. Leta Baptist physical evaluation found unsteady gait, we reviewed a comprehensive metabolic profile which returned normal except for elevated glucose.  This was an unknown first glucose level.  White blood cell count was 6.2 her hemoglobin was 11.1 rather low and hematocrit of 34 also on the low side.  Normal platelets, elevated Hgb A1c at 7.1, normal TSH, normal cholesterol.  No evidence of reduced renal function or liver function.  MRI brain in 2013 showed an incidental finding of a partially empty sella.  His diagnosis was "other fatigue, gait difficulties, and vertigo".  He ordered an ambulatory referral to sleep studies.    Chief complaint according to patient : " I am tired of feeling like this "   Sleep habits are as follows:  Before bedtime Tracy [name] watches TV or plays video games on her tabloid.  She goes usually to bed at around 10 but she is not asleep  before 11 PM usually, she feels better stretching out in bed as it helps her arthritis pain. Yesterday she slept about 11 hours usually she can sleep through after she is asleep.  She takes trazodone and melatonin.  In the morning she feels really wobbly and generalized weak and dizzy and has more vertigo.  So she likes to sleep and she feels more stable at a later point in the day.  She does not feel the trazodone  contributes to "the wobbliness" as she puts it.  But it seems that she is unsteady during the day as well. She denies any nocturia, diaphoresis, she has no headaches at night she usually sleeps on her side, on only one pillow for head support.  Her bedroom is cool, quiet and dark.  Whenever she wakes up in the morning she feels not restored and refreshed. Usually she wakes at 8 or 9 AM and finally  rises about 10 AM but it still allows for 10 hours of time in bed. She takes no naps in daytime, but struggles not to.   Sleep medical history and family sleep history: family member - sister has CPAP.    Social history: lives alone, has 7 adult children. Widowed. Control and instrumentation engineer , daycare. She quit smoking in 2013 or 14, until then smoked about half a pack a day, she has not touched alcohol in decades.  She stopped drinking coffee when  she quit smoking, but continues to drink soda and tea, up to 1 glass of tea daily and a small glass of caffeinated soda daily.  Does not consume energy drinks.   Review of Systems: Out of a complete 14 system review, the patient complains of only the following symptoms, and all other reviewed systems are negative.She endorsed the fatigue severity score of 63, the Epworth sleepiness score at 0 points the geriatric depression score at 3 points. She snores loudly- her children and grandchildren stated this. She has woken herself up. Dry mouth in AM>     Social History   Socioeconomic History  . Marital status: Divorced    Spouse name: Not on file  . Number of  children: 7  . Years of education: Not on file  . Highest education level: Not on file  Social Needs  . Financial resource strain: Not on file  . Food insecurity - worry: Not on file  . Food insecurity - inability: Not on file  . Transportation needs - medical: Not on file  . Transportation needs - non-medical: Not on file  Occupational History  . Occupation: retired  Tobacco Use  . Smoking status: Former Smoker    Packs/day: 1.00    Years: 20.00    Pack years: 20.00    Types: Cigarettes    Last attempt to quit: 07/30/2011    Years since quitting: 6.1  . Smokeless tobacco: Never Used  . Tobacco comment: Quit >4 years ago; 1 ppd for about 5/20 years (remaining was less)  Substance and Sexual Activity  . Alcohol use: No    Alcohol/week: 0.0 oz  . Drug use: No  . Sexual activity: Not on file  Other Topics Concern  . Not on file  Social History Narrative   Lives alone.  Retired.  Education:  11th grade GED.  Children:  7 (one here).     Family History  Problem Relation Age of Onset  . Diabetes Father   . Lung cancer Sister        dx. <50; former smoker  . Diabetes Brother   . Diabetes Paternal Aunt   . Stroke Maternal Grandmother   . Diabetes Paternal Grandmother   . Emphysema Mother 81       smoker  . Diabetes Brother   . Brain cancer Brother 67       unknown tumor type  . Cancer Daughter 2       neck cancer  . Other Daughter        hysterectomy for unspecified reason  . Breast cancer Cousin   . Cancer Cousin        unspecified type  . Breast cancer Other        triple negative breast cancer in her 80s  . Colon polyps Neg Hx   . Esophageal cancer Neg Hx   . Gallbladder disease Neg Hx     Past Medical History:  Diagnosis Date  . Anxiety   . Arthritis   . Breast cancer ([place]) 06/13/15  . Cancer ([place]) 2000   breast cancer  . Chronic bronchitis ([place])   . Chronic bronchitis ([place])   . Hyperlipidemia   . Hypertension   . Myocardial infarction ([place]) 2001  .  Restless leg   . Stroke [facility]) 2004   TIA, no deficits    Past Surgical History:  Procedure Laterality Date  . ABDOMINAL HYSTERECTOMY  1985  . BREAST LUMPECTOMY Left 2000   radiation and chemo  .  BREAST LUMPECTOMY Right 2016   radiation  . BREAST SURGERY  2001   lt breast lumpectomy  . RADIOACTIVE SEED GUIDED PARTIAL MASTECTOMY WITH AXILLARY SENTINEL LYMPH NODE BIOPSY Right 07/07/2015   Procedure: RIGHT RADIOACTIVE SEED GUIDED PARTIAL MASTECTOMY WITH AXILLARY SENTINEL LYMPH NODE BIOPSY;  Surgeon: Autumn Messing III, MD;  Location: [facility];  Service: General;  Laterality: Right;  . SMALL INTESTINE SURGERY    . TUBAL LIGATION      Current Outpatient Medications  Medication Sig Dispense Refill  . albuterol (PROAIR HFA) 108 (90 Base) MCG/ACT inhaler Inhale 2 puffs into the lungs every 4 (four) hours as needed for wheezing. 1 Inhaler 6  . aspirin 81 MG tablet Take 81 mg by mouth daily.    Marland Kitchen atorvastatin (LIPITOR) 10 MG tablet Take 1 tablet (10 mg total) daily by mouth. 90 tablet 1  . cyclobenzaprine (FLEXERIL) 10 MG tablet Take 1 tablet (10 mg total) by mouth 3 (three) times daily as needed for muscle spasms. 45 tablet 1  . ferrous sulfate 325 (65 FE) MG tablet Take 1 tablet (325 mg total) daily with breakfast by mouth. 30 tablet 6  . loratadine (CLARITIN) 10 MG tablet Take 10 mg by mouth daily.    Marland Kitchen losartan (COZAAR) 100 MG tablet Take 1 tablet (100 mg total) daily by mouth. 90 tablet 1  . meclizine (ANTIVERT) 25 MG tablet TAKE 1 TABLET(25 MG) BY MOUTH THREE TIMES DAILY AS NEEDED FOR DIZZINESS 30 tablet 3  . omeprazole (PRILOSEC) 40 MG capsule TAKE 1 CAPSULE BY MOUTH TWO TIMES DAILY 180 capsule 1  . sertraline (ZOLOFT) 50 MG tablet Take 1 tablet (50 mg total) by mouth daily. 30 tablet 3  . traZODone (DESYREL) 100 MG tablet Take 1 tablet (100 mg total) at bedtime by mouth. 90 tablet 1  . Vitamin D, Cholecalciferol, 400 UNITS CAPS Take 2 capsules by mouth daily.     No current  facility-administered medications for this visit.     Allergies as of 09/24/2017 - Review Complete 09/24/2017  Allergen Reaction Noted  . Bacitracin-neomycin-polymyxin  [neomycin-bacitracin zn-polymyx] Swelling 12/29/2014  . Nsaids Other (See Comments) 01/04/2015  . Tape Hives 11/07/2013  . Ambien [zolpidem tartrate]  04/02/2012  . Amoxicillin  02/03/2012  . Contrast media [iodinated diagnostic agents] Hives 11/06/2013  . Latex Swelling 09/14/2014  . Prednisone  09/05/2014    Vitals: BP 118/68   Pulse 80   Ht 5' (1.524 m)   Wt 195 lb (88.5 kg)   BMI 38.08 kg/m  Last Weight:  Wt Readings from Last 1 Encounters:  09/24/17 195 lb (88.5 kg)   RXV:QMGQ mass index is 38.08 kg/m.     Last Height:   Ht Readings from Last 1 Encounters:  09/24/17 5' (1.524 m)    Physical exam:  General: The patient is awake, alert and appears not in acute distress. The patient is well groomed. Head: Normocephalic, atraumatic. Neck is supple. Mallampati 5 ,  neck circumference:16" . Nasal airflow constricted . Retrognathia is seen.  Cardiovascular:  Regular rate and rhythm , without  murmurs or carotid bruit, and without distended neck veins. Respiratory: Lungs are clear to auscultation. Skin:  Without evidence of edema, or rash Trunk: BMI is 38. The patient's posture is stooped.    Neurologic exam :  Attention span & concentration ability appears normal.  Speech is fluent,  without  dysarthria, dysphonia or aphasia.  Mood and affect are appropriate.  Cranial nerves: Pupils are equal  and briskly reactive to light. Funduscopic exam deferred . Extraocular movements  in vertical and horizontal planes intact and without nystagmus. Visual fields by finger perimetry are intact. Hearing to finger rub intact.  Facial sensation intact to fine touch.  Facial motor strength is symmetric and tongue and uvula move midline. Shoulder shrug was symmetrical.   Motor exam:  Bilaterally frozen shoulder.  Otherwise  normal ROM- tone, muscle bulk and symmetric strength in all extremities. Crepitus over knee.   Sensory:  Fine touch, pinprick and vibration were tested in all extremities. Proprioception tested in the upper extremities was normal. Coordination: Rapid alternating movements in the fingers/hands was normal. Finger-to-nose maneuver  normal without evidence of ataxia, dysmetria or tremor. Gait and station: Patient walks without assistive device . I did not evaluate her gait any further  Deep tendon reflexes: in the  upper and lower extremities are symmetric and intact.     Assessment:  After physical and neurologic examination, review of laboratory studies,  Personal review of imaging studies, reports of other /same  Imaging studies, results of polysomnography and / or neurophysiology testing and pre-existing records as far as provided in visit., my assessment is   1) OSA morbidly obese female  At  high risk for OSA- neck size, BMI and Mallampati. In addition use of muscle relaxants and known to snore.   2) Patient has no hypersomnia- she endorsed Epworth at 0 / 24, paradoxical insomnia- she feels she needs sleep and cannot get there.   3) sleep hygiene - she watches TV in the bedroom , over 45 minutes. I advised her of the effect of Melatonin and need to eliminate screens and blue light from her bedroom.   The patient was advised of the nature of the diagnosed disorder , the treatment options and the  risks for general health and wellness arising from not treating the condition.   I spent more than 45 minutes of face to face time with the patient. Greater than 50% of time was spent in counseling and coordination of care. We have discussed the diagnosis and differential and I answered the patient's questions.    Plan:  Treatment plan and additional workup :  I will be happy to check Tracy [name] for OSA- if sleep study is positive will treat,  if negative will refer back to primary  neurologist.    Larey Seat, MD 7/78/2423, 5:36 PM  Certified in Neurology by ABPN Certified in Sleep Medicine by [facility] [address] [place], [place] [address]

## 2017-09-29 ENCOUNTER — Ambulatory Visit (INDEPENDENT_AMBULATORY_CARE_PROVIDER_SITE_OTHER): Payer: Medicare Other | Admitting: Family Medicine

## 2017-09-29 ENCOUNTER — Other Ambulatory Visit: Payer: Self-pay

## 2017-09-29 ENCOUNTER — Encounter: Payer: Self-pay | Admitting: Family Medicine

## 2017-09-29 VITALS — BP 120/70 | HR 68 | Temp 98.1°F | Resp 16 | Ht 60.0 in | Wt 192.2 lb

## 2017-09-29 DIAGNOSIS — E119 Type 2 diabetes mellitus without complications: Secondary | ICD-10-CM

## 2017-09-29 MED ORDER — ALBUTEROL SULFATE HFA 108 (90 BASE) MCG/ACT IN AERS: 2.0000 | INHALATION_SPRAY | RESPIRATORY_TRACT | 6 refills | Status: DC | PRN

## 2017-09-29 NOTE — Progress Notes (Signed)
   Subjective:    Patient ID: Tracy [name], female    DOB: 1946/02/07, 72 y.o.   MRN: [id number]  HPI DM- chronic problem.  A1C from 08/29/17 was 7.6  Pt is currently diet controlled.  UTD on eye exam, foot exam.  On ARB for renal protection.  Denies symptomatic lows.  No CP, SOB, visual changes, edema.  + HAs.  No numbness/tingling of hands/feet.  Pt is down 3 lbs from last visit.  Morbid obesity- pt's BMI is 37.6 but given her diabetes this classifies her as morbidly obese.  No regular exercise b/c of ongoing dizziness.   Review of Systems For ROS see HPI     Objective:   Physical Exam  Constitutional: She is oriented to person, place, and time. She appears well-developed and well-nourished. No distress.  HENT:  Head: Normocephalic and atraumatic.  Eyes: Conjunctivae and EOM are normal. Pupils are equal, round, and reactive to light.  Neck: Normal range of motion. Neck supple. No thyromegaly present.  Cardiovascular: Normal rate, regular rhythm, normal heart sounds and intact distal pulses.  No murmur heard. Pulmonary/Chest: Effort normal and breath sounds normal. No respiratory distress.  Abdominal: Soft. She exhibits no distension. There is no tenderness.  Musculoskeletal: She exhibits no edema.  Lymphadenopathy:    She has no cervical adenopathy.  Neurological: She is alert and oriented to person, place, and time.  Skin: Skin is warm and dry.  Psychiatric: She has a normal mood and affect. Her behavior is normal.  Vitals reviewed.         Assessment & Plan:

## 2017-09-29 NOTE — Assessment & Plan Note (Signed)
Chronic problem.  Pt's BMI is 37.6 but given her comorbidities she qualifies as morbidly obese.  Stressed need for healthy diet and regular exercise.  Will continue to follow.

## 2017-09-29 NOTE — Patient Instructions (Addendum)
Follow up in 3-4 months to recheck diabetes, BP, and cholesterol No need for blood work today b/c Dr Leta Baptist checked this last month Continue to work on [employer] and exercise as able- you can do it! No med changes at this time Call with any questions or concerns [place]!!!

## 2017-10-02 ENCOUNTER — Other Ambulatory Visit: Payer: Self-pay | Admitting: Family Medicine

## 2017-10-06 ENCOUNTER — Other Ambulatory Visit: Payer: Self-pay | Admitting: Family Medicine

## 2017-10-13 ENCOUNTER — Encounter: Payer: Self-pay | Admitting: Diagnostic Neuroimaging

## 2017-10-24 ENCOUNTER — Ambulatory Visit (INDEPENDENT_AMBULATORY_CARE_PROVIDER_SITE_OTHER): Payer: Medicare Other | Admitting: Neurology

## 2017-10-24 DIAGNOSIS — R0683 Snoring: Secondary | ICD-10-CM

## 2017-10-24 DIAGNOSIS — G4730 Sleep apnea, unspecified: Secondary | ICD-10-CM | POA: Diagnosis not present

## 2017-10-24 DIAGNOSIS — Z6838 Body mass index (BMI) 38.0-38.9, adult: Secondary | ICD-10-CM

## 2017-10-24 DIAGNOSIS — E6609 Other obesity due to excess calories: Secondary | ICD-10-CM

## 2017-10-24 DIAGNOSIS — Z79899 Other long term (current) drug therapy: Secondary | ICD-10-CM

## 2017-10-24 DIAGNOSIS — I2510 Atherosclerotic heart disease of native coronary artery without angina pectoris: Secondary | ICD-10-CM

## 2017-10-29 ENCOUNTER — Telehealth: Payer: Self-pay | Admitting: Neurology

## 2017-10-29 ENCOUNTER — Other Ambulatory Visit: Payer: Self-pay | Admitting: Neurology

## 2017-10-29 DIAGNOSIS — R0789 Other chest pain: Secondary | ICD-10-CM

## 2017-10-29 DIAGNOSIS — I2510 Atherosclerotic heart disease of native coronary artery without angina pectoris: Secondary | ICD-10-CM

## 2017-10-29 DIAGNOSIS — G4709 Other insomnia: Secondary | ICD-10-CM

## 2017-10-29 DIAGNOSIS — R0683 Snoring: Secondary | ICD-10-CM

## 2017-10-29 NOTE — Procedures (Signed)
PATIENT'S NAME:  Tracy [name], Tracy [name] DOB:      12-Sep-1945      MR#:    [id number]     DATE OF RECORDING: 10/24/2017 REFERRING M.D.:  Andrey Spearman, M.D. Study Performed:   Baseline Polysomnogram HISTORY: Tracy [name] is a 72 y.o. female patient, seen here in a referral from Dr.  Andrey Spearman at [facility]. The patient had a sleep study about 20 years ago in [place], never was on CPAP. Loud snoring, insomnia and RLS were endorsed. Dr. Gladstone Lighter physical evaluation found unsteady gait. She had a complaint of dizziness, generalized weakness, CVA. Dr. Leta Baptist noted a history of vertigo once or twice a year which usually responds to meclizine.  Her Breast cancer dx in 2001 was followed by chemotherapy and radiation, restless leg syndrome, nausea. The patient had no falls in response to her generalized weakness. She was diagnosed with a myocardial infarction in 2001 and a TIA in 2004.   The patient endorsed the Epworth Sleepiness Scale at 0/24 points.   The patient's weight 195 pounds with a height of 60 (inches), resulting in a BMI of 38.1 kg/m2. The patient's neck circumference measured 16 inches.  CURRENT MEDICATIONS: ProAir, Aspirin, Lipitor, Flexeril, Cozaar, Antivert, Prilosec, Zoloft, Desyrel, Iron, Claritin D, Vit. D.  PROCEDURE:  This is a multichannel digital polysomnogram utilizing the SomnoStar 11.2 system.  Electrodes and sensors were applied and monitored per AASM Specifications.   EEG, EOG, Chin and Limb EMG, were sampled at 200 Hz.  ECG, Snore and Nasal Pressure, Thermal Airflow, Respiratory Effort, CPAP Flow and Pressure, Oximetry was sampled at 50 Hz. Digital video and audio were recorded.      BASELINE STUDY: Lights Out was at 20:54 and Lights On at 04:49.  Total recording time (TRT) was 475.5 minutes, with a total sleep time (TST) of 323 minutes.   The patient's sleep latency was 72 minutes.  REM latency was 361.5 minutes.  The sleep efficiency was poor at 67.9 %.     SLEEP ARCHITECTURE: WASO  (Wake after sleep onset) was 71.5 minutes.  There were 36 minutes in Stage N1, 238.5 minutes Stage N2, 20.5 minutes Stage N3 and 28 minutes in Stage REM.  The percentage of Stage N1 was 11.1%, Stage N2 was 73.8%, Stage N3 was 6.3% and Stage R (REM sleep) was 8.7%.   RESPIRATORY ANALYSIS:  There were a total of 155 respiratory events:  99 obstructive apneas, 12 central apneas and 44 hypopneas with 0 respiratory event related arousals (RERAs).   The total APNEA/HYPOPNEA INDEX (AHI) was 28.8/hour and the total RESPIRATORY DISTURBANCE INDEX was 28.8 /hour.  6 events occurred in REM sleep and 96 events in NREM. The REM AHI was 12.9 /hour, versus a non-REM AHI of 30.3. The patient spent 65.5 minutes of total sleep time in the supine position and 258 minutes in non-supine. The supine AHI was 16.5 versus a non-supine AHI of 31.9.  OXYGEN SATURATION & C02:  The Wake baseline 02 saturation was 96%, with the lowest being 83%. Time spent below 89% saturation equaled 5 minutes.   PERIODIC LIMB MOVEMENTS:   The patient had a total of 32 Periodic Limb Movements.  The Periodic Limb Movement (PLM) index was 5.9 and the PLM Arousal index was 1.5/hour. The arousals were noted as: 40 were spontaneous, 8 were associated with PLMs, and 16 were associated with respiratory events. Audio and video analysis did not show any abnormal or unusual movements, behaviors, phonations or vocalizations. Sleep was very  fragmented.   The patient took bathroom breaks. Loud Snoring was noted. EKG was in keeping with normal sinus rhythm (NSR).  Post-study, the patient indicated that sleep was more restless than usual.   IMPRESSION:  1. Moderate Complex-, mostly Obstructive Sleep Apnea (OSA) with loud snoring, neither exacerbated in REM nor supine sleep.  2. No clinically significant hypoxemia.  3. Moderate Periodic Limb Movement Disorder (PLMD) 4. Repetitive interruption of Sleep  RECOMMENDATIONS:  1. Advise full-night, attended,  CPAP titration study to optimize therapy. If the patient agrees, will start treatment for RLS/ PLMs before the return study.  I will offer Requip at night time po.  2. Avoid sedative-hypnotics, alcohol, caffeine and nicotine which may worsen sleep apnea, (as applicable). 3. Advise to lose weight, diet and exercise if not contraindicated (BMI 38). 4. Further information regarding OSA may be obtained from [employer] (www.sleepfoundation.org) or American Sleep Apnea Association (www.sleepapnea.org). 5. Correlate clinically for a history consistent with regarding restless legs syndrome (RLS).    Consider secondary restless legs syndrome.  Pharmacotherapy may be warranted.  Obtain a serum ferritin level if the clinical history is consistent with RLS.  Consider iron therapy and evaluation for iron deficiency anemia if the serum ferritin level < 50 ng/mL.  Certain medications or substances may aggravate RLS and common offenders may include the following:  nicotine, caffeine, SSRIs, TCAs, phenothiazine, dopamine antagonists, diphenhydramine, and alcohol.   6. Consider dedicated sleep psychology referral if insomnia is of clinical concern.   7. A follow up appointment will be scheduled at [facility]. The referring provider will be notified of the results.     I certify that I have reviewed the entire raw data recording prior to the issuance of this report in accordance with the Standards of Accreditation of the American Academy of Sleep Medicine (AASM)   Larey Seat, MD   10-29-2017 Diplomat, American Board of Psychiatry and Neurology  Diplomat, American Board of [place] Director, [employer] [facility] at [employer]

## 2017-10-30 ENCOUNTER — Other Ambulatory Visit: Payer: Self-pay | Admitting: Neurology

## 2017-10-30 DIAGNOSIS — G4709 Other insomnia: Secondary | ICD-10-CM

## 2017-10-30 DIAGNOSIS — R0683 Snoring: Secondary | ICD-10-CM

## 2017-10-30 DIAGNOSIS — I2510 Atherosclerotic heart disease of native coronary artery without angina pectoris: Secondary | ICD-10-CM

## 2017-10-30 DIAGNOSIS — R0789 Other chest pain: Secondary | ICD-10-CM

## 2017-10-30 NOTE — Procedures (Signed)
PATIENT'S NAME:  Tracy, [name] DOB:      16-Mar-1946      MR#:    [id number]     DATE OF RECORDING: 10/24/2017 REFERRING M.D.:  Andrey Spearman, MD. Study Performed:   Baseline Polysomnogram HISTORY: Tracy [name] is a 72 y.o. female patient, who had a sleep study about 20 years ago in [place], never was on CPAP. Loud snoring, insomnia and RLS were endorsed. Dr. Gladstone Lighter physical evaluation found unsteady gait. She had a complaint of dizziness, generalized weakness, CVA. Dr. Leta Baptist noted a history of vertigo once or twice a year which usually responds to meclizine.  Her Breast cancer dx in 2001 was followed by chemotherapy and radiation, restless leg syndrome, nausea. The patient had no falls in response to her generalized weakness. She was diagnosed with a myocardial infarction in 2001 and a TIA in 2004.   The patient endorsed the Epworth Sleepiness Scale at 0/24 points.   The patient's weight 195 pounds with a height of 60 (inches), resulting in a BMI of 38.1 kg/m2. The patient's neck circumference measured 16 inches.  CURRENT MEDICATIONS: Proair, Aspirin, Lipitor, Flexeril, Iron, Claritin, Cozaar, Antivert, Prilosec, Zoloft, Desyrel, Vitamin D.  PROCEDURE:  This is a multichannel digital polysomnogram utilizing the SomnoStar 11.2 system.  Electrodes and sensors were applied and monitored per AASM Specifications.   EEG, EOG, Chin and Limb EMG, were sampled at 200 Hz.  ECG, Snore and Nasal Pressure, Thermal Airflow, Respiratory Effort, CPAP Flow and Pressure, Oximetry was sampled at 50 Hz. Digital video and audio were recorded.      BASELINE STUDY: Lights Out was at 20:54 and Lights On at 04:49.  Total recording time (TRT) was 475.5 minutes, with a total sleep time (TST) of 323 minutes.   The patient's sleep latency was 72 minutes.  REM latency was 361.5 minutes.  The sleep efficiency was poor at 67.9 %.     SLEEP ARCHITECTURE: WASO (Wake after sleep onset) was 71.5 minutes.  There were 36 minutes  in Stage N1, 238.5 minutes Stage N2, 20.5 minutes Stage N3 and 28 minutes in Stage REM.  The percentage of Stage N1 was 11.1%, Stage N2 was 73.8%, Stage N3 was 6.3% and Stage R (REM sleep) was 8.7%.   RESPIRATORY ANALYSIS:  There were a total of 155 respiratory events:  99 obstructive apneas, 12 central apneas and 44 hypopneas with 0 respiratory event related arousals (RERAs).   The total APNEA/HYPOPNEA INDEX (AHI) was 28.8/hour and the total RESPIRATORY DISTURBANCE INDEX was 28.8 /hour.  6 events occurred in REM sleep and 96 events in NREM. The REM AHI was 12.9 /hour, versus a non-REM AHI of 30.3. The patient spent 65.5 minutes of total sleep time in the supine position and 258 minutes in non-supine. The supine AHI was 16.5 versus a non-supine AHI of 31.9.  OXYGEN SATURATION & C02:  The Wake baseline 02 saturation was 96%, with the lowest being 83%. Time spent below 89% saturation equaled 5 minutes.   PERIODIC LIMB MOVEMENTS:   The patient had a total of 32 Periodic Limb Movements.  The Periodic Limb Movement (PLM) index was 5.9 and the PLM Arousal index was 1.5/hour. The arousals were noted as: 40 were spontaneous, 8 were associated with PLMs, and 16 were associated with respiratory events. Audio and video analysis did not show any abnormal or unusual movements, behaviors, phonations or vocalizations. Sleep was very fragmented.   The patient took bathroom breaks. Loud Snoring was noted. EKG was  in keeping with normal sinus rhythm (NSR).  Post-study, the patient indicated that sleep was more restless than usual.   IMPRESSION:  1. Moderate Complex-, mostly Obstructive Sleep Apnea (OSA) with loud snoring, neither exacerbated in REM nor supine sleep.  2. No clinically significant hypoxemia.  3. Moderate Periodic Limb Movement Disorder (PLMD) 4. Repetitive interruption of Sleep  RECOMMENDATIONS:  1. Advise full-night, attended, CPAP titration study to optimize therapy. If the patient agrees, will  start treatment for RLS/ PLMs before the return study.  I will offer Requip at night time po.  2. Avoid sedative-hypnotics, alcohol, caffeine and nicotine which may worsen sleep apnea, (as applicable). 3. Advise to lose weight, diet and exercise if not contraindicated (BMI 38). 4. Further information regarding OSA may be obtained from [employer] (www.sleepfoundation.org) or American Sleep Apnea Association (www.sleepapnea.org). 5. Correlate clinically for a history consistent with regarding restless legs syndrome (RLS).    Consider secondary restless legs syndrome.  Pharmacotherapy may be warranted.  Obtain a serum ferritin level if the clinical history is consistent with RLS.  Consider iron therapy and evaluation for iron deficiency anemia if the serum ferritin level < 50 ng/ml. Certain medications or substances may aggravate RLS and common offenders may include the following:  nicotine, caffeine, SSRIs, TCAs, phenothiazine, dopamine antagonists, diphenhydramine, and alcohol.   6. Consider dedicated sleep psychology referral if insomnia is of clinical concern.   7. A follow up appointment will be scheduled at [facility]. The referring provider will be notified of the results.     I certify that I have reviewed the entire raw data recording prior to the issuance of this report in accordance with the Standards of Accreditation of the American Academy of Sleep Medicine (AASM)   Larey Seat, MD   10-29-2017 Diplomat, American Board of Psychiatry and Neurology  Diplomat, American Board of [place] Director, [facility] [employer]

## 2017-11-03 ENCOUNTER — Telehealth: Payer: Self-pay | Admitting: Neurology

## 2017-11-03 NOTE — Telephone Encounter (Signed)
-----   Message from Larey Seat, MD sent at 10/30/2017  6:01 PM EDT ----- IMPRESSION:  1. Moderate Complex-, mostly Obstructive Sleep Apnea (OSA) with  loud snoring, neither exacerbated in REM nor supine sleep.  2. No clinically significant hypoxemia.  3. Moderate Periodic Limb Movement Disorder (PLMD) 4. Repetitive interruption of Sleep  RECOMMENDATIONS:  1. Advise full-night, attended, CPAP titration study to optimize  therapy. If the patient agrees, will start treatment for RLS/  PLMs before the return study. I will offer Requip at night time  po.  2. Avoid sedative-hypnotics, alcohol, caffeine and nicotine which  may worsen sleep apnea, (as applicable). 3. Advise to lose weight, diet and exercise if not  contraindicated (BMI 38).

## 2017-11-03 NOTE — Telephone Encounter (Signed)
I called pt. I advised pt that Dr. Brett Fairy reviewed their sleep study results and found that complex sleep apnea and recommends that pt be treated with a CPAP. Dr. Brett Fairy recommends that pt return for a repeat sleep study in order to properly titrate the CPAP and ensure a good mask fit. The patient is very hesitant with the CPAP treatment plan. She doesn't feel that she will want to go through with this. I have educated the patient on how she stopped breathing close to 30 times an hour and that this can cause negative effects on her body if left untreated. I informed the pt on how leaving this untreated can also put her at a increase risk of more TIA's of strokes. Pt verbalized understanding.I advised pt that our sleep lab will file with pt's insurance and call pt to schedule the sleep study when we hear back from the pt's insurance regarding coverage of this sleep study. Pt verbalized understanding of results. Pt had no questions at this time but was encouraged to call back if questions arise. The patient will decide at the time of call whether to proceed forward with the titration test.

## 2017-11-05 ENCOUNTER — Encounter: Payer: Self-pay | Admitting: Family Medicine

## 2017-11-05 ENCOUNTER — Other Ambulatory Visit: Payer: Self-pay

## 2017-11-05 ENCOUNTER — Ambulatory Visit (INDEPENDENT_AMBULATORY_CARE_PROVIDER_SITE_OTHER): Payer: Medicare Other | Admitting: Family Medicine

## 2017-11-05 VITALS — BP 121/81 | HR 77 | Temp 99.8°F | Resp 16 | Ht 60.0 in | Wt 188.5 lb

## 2017-11-05 DIAGNOSIS — G4733 Obstructive sleep apnea (adult) (pediatric): Secondary | ICD-10-CM

## 2017-11-05 DIAGNOSIS — E1140 Type 2 diabetes mellitus with diabetic neuropathy, unspecified: Secondary | ICD-10-CM | POA: Insufficient documentation

## 2017-11-05 MED ORDER — GABAPENTIN 100 MG PO CAPS: 100.0000 mg | ORAL_CAPSULE | Freq: Three times a day (TID) | ORAL | 3 refills | Status: DC

## 2017-11-05 NOTE — Progress Notes (Signed)
   Subjective:    Patient ID: Tracy [name], female    DOB: [birth year], 72 y.o.   MRN: [id number]  HPI Tingling of hands/feet- pt reports that she will have tingling of both her hands and feet that alternate between 'hot tingling and cold tingling'.  At night sxs are worse and pt feels the need to continually move feet- 'It doesn't go away though'.  Pt reports sxs have been 'going on for awhile'.  Sensation remains intact.  OSA- pt is having a hard time believing the results of her recent sleep study that 'says I stop breathing 30x/hr'.  Pt is fearful that using a CPAP will be cumbersome and prevent her from traveling.   Review of Systems For ROS see HPI     Objective:   Physical Exam  Constitutional: She is oriented to person, place, and time. She appears well-developed and well-nourished. No distress.  obese  Neurological: She is alert and oriented to person, place, and time. She has normal reflexes. No cranial nerve deficit. Coordination normal.  No tremor noted Sensation intact of feet bilaterally  Skin: Skin is warm and dry. No rash noted. No erythema.  Psychiatric: She has a normal mood and affect. Her behavior is normal. Thought content normal.  Vitals reviewed.         Assessment & Plan:

## 2017-11-05 NOTE — Patient Instructions (Addendum)
Follow up in 4-6 weeks to recheck tingling in your hands/feet START the Gabapentin nightly x1 week and then increase to twice daily x1 week and then increase to 3x/day PLEASE consider getting the CPAP to help with your daytime sleepiness Call with any questions or concerns [place]!!!

## 2017-11-05 NOTE — Assessment & Plan Note (Signed)
Pt's burning/tingling of hands and feet is consistent w/ neuropathy.  Some component of RLS.  Will start Gabapentin nightly and titrate up to TID.  Once she is tolerating 100mg  TID will increase to 300mg  TID if needed.

## 2017-11-05 NOTE — Assessment & Plan Note (Signed)
Pt had sleep study done that showed significant apnea.  Pt is having a hard time believing the results.  Had a discussion that regardless of the # of apneic events, she is having them and it is contributing to her daytime sleepiness.  Encouraged her to strongly consider the CPAP to improve her quality of life.  Will follow.

## 2017-11-26 ENCOUNTER — Other Ambulatory Visit: Payer: Self-pay | Admitting: Family Medicine

## 2017-11-27 NOTE — Progress Notes (Addendum)
ERROR

## 2018-01-19 ENCOUNTER — Encounter: Payer: Self-pay | Admitting: Family Medicine

## 2018-01-19 ENCOUNTER — Other Ambulatory Visit: Payer: Self-pay

## 2018-01-19 ENCOUNTER — Ambulatory Visit (INDEPENDENT_AMBULATORY_CARE_PROVIDER_SITE_OTHER): Payer: Medicare Other | Admitting: Family Medicine

## 2018-01-19 VITALS — BP 128/86 | HR 79 | Temp 98.1°F | Resp 16 | Ht 60.0 in | Wt 189.4 lb

## 2018-01-19 DIAGNOSIS — E785 Hyperlipidemia, unspecified: Secondary | ICD-10-CM

## 2018-01-19 DIAGNOSIS — E1140 Type 2 diabetes mellitus with diabetic neuropathy, unspecified: Secondary | ICD-10-CM | POA: Diagnosis not present

## 2018-01-19 DIAGNOSIS — I10 Essential (primary) hypertension: Secondary | ICD-10-CM

## 2018-01-19 LAB — BASIC METABOLIC PANEL
BUN: 11 mg/dL (ref 6–23)
CALCIUM: 8.8 mg/dL (ref 8.4–10.5)
CO2: 25 mEq/L (ref 19–32)
Chloride: 104 mEq/L (ref 96–112)
Creatinine, Ser: 0.95 mg/dL (ref 0.40–1.20)
GFR: 73.99 mL/min (ref >60.00)
GLUCOSE: 157 mg/dL — AB (ref 70–99)
Potassium: 3.9 mEq/L (ref 3.5–5.1)
SODIUM: 137 meq/L (ref 135–145)

## 2018-01-19 LAB — CBC WITH DIFFERENTIAL/PLATELET
BASOS ABS: 0.1 10*3/uL (ref 0.0–0.1)
Basophils Relative: 0.9 % (ref 0.0–3.0)
Eosinophils Absolute: 0.1 10*3/uL (ref 0.0–0.7)
Eosinophils Relative: 1.7 % (ref 0.0–5.0)
HEMATOCRIT: 27.1 % — AB (ref 36.0–46.0)
Hemoglobin: 9 g/dL — ABNORMAL LOW (ref 12.0–15.0)
LYMPHS PCT: 44 % (ref 12.0–46.0)
Lymphs Abs: 2.7 10*3/uL (ref 0.7–4.0)
MCHC: 33.1 g/dL (ref 30.0–36.0)
MCV: 87.3 fl (ref 78.0–100.0)
MONOS PCT: 5.8 % (ref 3.0–12.0)
Monocytes Absolute: 0.4 10*3/uL (ref 0.1–1.0)
NEUTROS ABS: 3 10*3/uL (ref 1.4–7.7)
Neutrophils Relative %: 47.6 % (ref 43.0–77.0)
Platelets: 299 10*3/uL (ref 150.0–400.0)
RBC: 3.1 Mil/uL — AB (ref 3.87–5.11)
RDW: 18.3 % — ABNORMAL HIGH (ref 11.5–15.5)
WBC: 6.2 10*3/uL (ref 4.0–10.5)

## 2018-01-19 LAB — HEPATIC FUNCTION PANEL
ALBUMIN: 3.8 g/dL (ref 3.5–5.2)
ALT: 14 U/L (ref 0–35)
AST: 17 U/L (ref 0–37)
Alkaline Phosphatase: 89 U/L (ref 39–117)
Bilirubin, Direct: 0 mg/dL (ref 0.0–0.3)
TOTAL PROTEIN: 7.6 g/dL (ref 6.0–8.3)
Total Bilirubin: 0.2 mg/dL (ref 0.2–1.2)

## 2018-01-19 LAB — LIPID PANEL
CHOL/HDL RATIO: 3
CHOLESTEROL: 152 mg/dL (ref 0–200)
HDL: 58.6 mg/dL (ref >39.00)
LDL CALC: 65 mg/dL (ref 0–99)
NonHDL: 92.97
Triglycerides: 139 mg/dL (ref 0.0–149.0)
VLDL: 27.8 mg/dL (ref 0.0–40.0)

## 2018-01-19 LAB — HEMOGLOBIN A1C: Hgb A1c MFr Bld: 6.8 % — ABNORMAL HIGH (ref 4.6–6.5)

## 2018-01-19 LAB — TSH: TSH: 0.81 u[IU]/mL (ref 0.35–4.50)

## 2018-01-19 MED ORDER — GABAPENTIN 100 MG PO CAPS: 200.0000 mg | ORAL_CAPSULE | Freq: Three times a day (TID) | ORAL | 3 refills | Status: DC

## 2018-01-19 NOTE — Progress Notes (Signed)
   Subjective:    Patient ID: Tracy [name], female    DOB: 1945/10/04, 72 y.o.   MRN: [id number]  HPI DM- chronic problem, currently diet controlled.  UTD on foot exam, eye exam, and on ARB for renal protection.  Pt is still having tingling of feet despite starting gabapentin 100mg  TID.    HTN- chronic problem, on Losartan 100mg  daily.  Denies CP, SOB, HAs, visual changes, edema.  Hyperlipidemia- chronic problem, on Lipitor 10mg  daily.  Denies abd pain, N/V, myalgias.   Review of Systems For ROS see HPI     Objective:   Physical Exam  Constitutional: She is oriented to person, place, and time. She appears well-developed and well-nourished. No distress.  HENT:  Head: Normocephalic and atraumatic.  Eyes: Pupils are equal, round, and reactive to light. Conjunctivae and EOM are normal.  Neck: Normal range of motion. Neck supple. No thyromegaly present.  Cardiovascular: Normal rate, regular rhythm, normal heart sounds and intact distal pulses.  No murmur heard. Pulmonary/Chest: Effort normal and breath sounds normal. No respiratory distress.  Abdominal: Soft. She exhibits no distension. There is no tenderness.  Musculoskeletal: She exhibits no edema.  Lymphadenopathy:    She has no cervical adenopathy.  Neurological: She is alert and oriented to person, place, and time.  Skin: Skin is warm and dry.  Psychiatric: She has a normal mood and affect. Her behavior is normal.  Vitals reviewed.         Assessment & Plan:

## 2018-01-19 NOTE — Assessment & Plan Note (Signed)
Chronic problem.  Diet controlled.  UTD on foot exam, eye exam, on ARB for renal.  Stressed need for healthy diet and regular exercise.  Increase Gabapentin to 200mg  TID.  Check labs and start meds prn.

## 2018-01-19 NOTE — Assessment & Plan Note (Signed)
Chronic problem.  Well controlled.  Asymptomatic.  Check labs.  No anticipated med changes.  Will follow. 

## 2018-01-19 NOTE — Assessment & Plan Note (Signed)
Chronic problem, tolerating statin w/o difficulty.  Check labs.  Adjust meds prn  

## 2018-01-19 NOTE — Patient Instructions (Signed)
Follow up in 3-4 months to recheck diabetes We'll notify you of your lab results and make any changes if needed Continue to work on healthy diet and regular exercise- you can do it! Have your eye doctor send me a copy of their report next month Increase the Gabapentin to 200 nightly x1 week (continue the 100mg  for the other 2 doses).  Then increase to 200mg  twice daily (with the 100mg  for the other dose) and then increase to 200mg  3x/day. Call with any questions or concerns Have a great summer!!

## 2018-01-20 ENCOUNTER — Other Ambulatory Visit: Payer: Self-pay | Admitting: General Practice

## 2018-01-20 DIAGNOSIS — D509 Iron deficiency anemia, unspecified: Secondary | ICD-10-CM

## 2018-01-20 MED ORDER — FERROUS SULFATE 325 (65 FE) MG PO TABS: 325.0000 mg | ORAL_TABLET | Freq: Every day | ORAL | 6 refills | Status: DC

## 2018-02-03 ENCOUNTER — Other Ambulatory Visit (INDEPENDENT_AMBULATORY_CARE_PROVIDER_SITE_OTHER): Payer: Medicare Other

## 2018-02-03 DIAGNOSIS — D509 Iron deficiency anemia, unspecified: Secondary | ICD-10-CM | POA: Diagnosis not present

## 2018-02-03 DIAGNOSIS — M5416 Radiculopathy, lumbar region: Secondary | ICD-10-CM | POA: Diagnosis not present

## 2018-02-04 LAB — FECAL OCCULT BLOOD, IMMUNOCHEMICAL: Fecal Occult Bld: NEGATIVE

## 2018-02-10 ENCOUNTER — Other Ambulatory Visit: Payer: Self-pay | Admitting: Family Medicine

## 2018-02-18 DIAGNOSIS — M25552 Pain in left hip: Secondary | ICD-10-CM | POA: Diagnosis not present

## 2018-02-24 ENCOUNTER — Other Ambulatory Visit: Payer: Self-pay | Admitting: Family Medicine

## 2018-02-24 DIAGNOSIS — M79672 Pain in left foot: Secondary | ICD-10-CM | POA: Diagnosis not present

## 2018-02-24 DIAGNOSIS — M19072 Primary osteoarthritis, left ankle and foot: Secondary | ICD-10-CM | POA: Diagnosis not present

## 2018-02-24 MED ORDER — GABAPENTIN 100 MG PO CAPS: 200.0000 mg | ORAL_CAPSULE | Freq: Three times a day (TID) | ORAL | 3 refills | Status: DC

## 2018-02-24 NOTE — Telephone Encounter (Signed)
Copied from [place] [phone]. Topic: Quick Communication - Rx Refill/Question >> Feb 24, 2018 12:04 PM Bea Graff, NT wrote: Medication: gabapentin (NEURONTIN) 100 MG capsule  Has the patient contacted their pharmacy? Yes.   (Agent: If no, request that the patient contact the pharmacy for the refill.) (Agent: If yes, when and what did the pharmacy advise?)  Preferred Pharmacy (with phone number or street name): [facility], [place] [phone] (Phone) [phone] (Fax)      Agent: Please be advised that RX refills may take up to 3 business days. We ask that you follow-up with your pharmacy.

## 2018-02-24 NOTE — Telephone Encounter (Signed)
Gabapentin refill. Pt requesting for refills that were previously sent to [facility] to go to [facility]. Medication sent to [facility] as requested

## 2018-03-02 ENCOUNTER — Encounter: Payer: Self-pay | Admitting: Diagnostic Neuroimaging

## 2018-03-02 ENCOUNTER — Ambulatory Visit: Payer: Medicare Other | Admitting: Diagnostic Neuroimaging

## 2018-03-02 VITALS — BP 103/61 | HR 68 | Ht 60.0 in | Wt 192.2 lb

## 2018-03-02 DIAGNOSIS — R5383 Other fatigue: Secondary | ICD-10-CM

## 2018-03-02 DIAGNOSIS — R269 Unspecified abnormalities of gait and mobility: Secondary | ICD-10-CM | POA: Diagnosis not present

## 2018-03-02 DIAGNOSIS — G4730 Sleep apnea, unspecified: Secondary | ICD-10-CM

## 2018-03-02 DIAGNOSIS — G4709 Other insomnia: Secondary | ICD-10-CM | POA: Diagnosis not present

## 2018-03-02 NOTE — Progress Notes (Signed)
[facility]  PATIENT: Tracy [name] DOB: 10/07/1945  REFERRING CLINICIAN: Alver Fisher, MD HISTORY FROM: patient  REASON FOR VISIT: follow up   HISTORICAL  CHIEF COMPLAINT:  Chief Complaint  Patient presents with  . Gait Problem    rm 6, "I don't want to use a CPAP, did not go for PT eval- just forgot; I ordered a medical alert I wear when I am at home"  . Follow-up    6 month    HISTORY OF PRESENT ILLNESS:   UPDATE (03/02/18, VRP): Since last visit, doing about the same. Symptoms are intermittent falling. Had 2 more falls. No major injuries. Severity is moderate. No alleviating or aggravating factors. Tolerating meds. Did not try PT yet. Does not want CPAP.   NEW HPI (08/29/17): 72 year old old female here for evaluation of dizziness.  I previously saw patient in 2013 for similar symptoms.  Patient reports a balance and walking problem. Intermittent loss of energy in body. General low energy. Daily attacks, up to 30 minutes at a time. No falls.  Patient denies any focal weakness in her arms or legs.  She feels general weakness and fatigue.  Separately has vertigo attacks 1-2 x per year; meclizine helps.   UPDATE 04/13/12: Doing better. Dizziness improved. Patient reports remote history of RLS and poor sleep. Still with unrefreshing sleep. Epworth score 12. Snores at nighttime.   PRIOR HPI (02/28/12): 72 year old right-handed female with history of hypertension, breast cancer 2001, status post chemotherapy and radiation therapy, restless leg syndrome, here for evaluation of intermittent episodes of dizziness since the beginning of July 2013. Patient describes brief episodes of "dizziness" which he describes as a off-balance and exhaustion feeling. Sometimes she has nausea with this. Typically triggered when she wakes up in the morning and sits up or stands up. Sometimes she falls back to the bed and has to sit there for several moments before able to stand up again. She  denies true room spinning sensation. No slurred speech, trouble talking, double vision, passing out. She's tried meclizine which did not help her, and patient thinks may have made symptoms worse. Patient reports prior episode of vertigo lasting one to 2 days, which previously responded well to meclizine.  REVIEW OF SYSTEMS: Full 14 system review of systems performed and negative with exception of: memory loss trouble swallowing restless legs neck pain anxiety confusion cramps light sensitivity.    ALLERGIES: Allergies  Allergen Reactions  . Bacitracin-Neomycin-Polymyxin  [Neomycin-Bacitracin Zn-Polymyx] Swelling  . Nsaids Other (See Comments)    nosebleeds  . Tape Hives    Pt cannot tolerate bandaids, tape, or any other adhesives.   . Ambien [Zolpidem Tartrate]     Hives   . Amoxicillin     Rash   . Contrast Media [Iodinated Diagnostic Agents] Hives    Pt states hives with prior ct, was given benadryl to resolve  . Latex Swelling  . Prednisone     HOME MEDICATIONS: Outpatient Medications Prior to Visit  Medication Sig Dispense Refill  . albuterol (PROAIR HFA) 108 (90 Base) MCG/ACT inhaler Inhale 2 puffs into the lungs every 4 (four) hours as needed for wheezing. 1 Inhaler 6  . aspirin 81 MG tablet Take 81 mg by mouth daily.    Marland Kitchen atorvastatin (LIPITOR) 10 MG tablet TAKE 1 TABLET BY MOUTH  DAILY 90 tablet 1  . cyclobenzaprine (FLEXERIL) 10 MG tablet Take 1 tablet (10 mg total) by mouth 3 (three) times daily as needed for muscle  spasms. 45 tablet 1  . ferrous sulfate 325 (65 FE) MG tablet Take 1 tablet (325 mg total) by mouth daily with breakfast. 30 tablet 6  . gabapentin (NEURONTIN) 100 MG capsule Take 2 capsules (200 mg total) by mouth 3 (three) times daily. 180 capsule 3  . loratadine (CLARITIN) 10 MG tablet Take 10 mg by mouth daily.    Marland Kitchen losartan (COZAAR) 100 MG tablet TAKE 1 TABLET BY MOUTH  DAILY 90 tablet 1  . meclizine (ANTIVERT) 25 MG tablet TAKE 1 TABLET(25 MG) BY MOUTH  THREE TIMES DAILY AS NEEDED FOR DIZZINESS 30 tablet 3  . omeprazole (PRILOSEC) 40 MG capsule TAKE 1 CAPSULE BY MOUTH TWO TIMES DAILY 180 capsule 1  . sertraline (ZOLOFT) 50 MG tablet TAKE 1 TABLET(50 MG) BY MOUTH DAILY 90 tablet 1  . traZODone (DESYREL) 100 MG tablet TAKE 1 TABLET BY MOUTH AT  BEDTIME 90 tablet 1  . Vitamin D, Cholecalciferol, 400 UNITS CAPS Take 2 capsules by mouth daily.     No facility-administered medications prior to visit.     PAST MEDICAL HISTORY: Past Medical History:  Diagnosis Date  . Anxiety   . Arthritis   . Breast cancer ([facility]) 06/13/15  . Cancer ([facility]) 2000   breast cancer  . Chronic bronchitis ([employer])   . Chronic bronchitis ([facility])   . Hyperlipidemia   . Hypertension   . Myocardial infarction ([facility]) 2001  . Restless leg   . Stroke [facility]) 2004   TIA, no deficits    PAST SURGICAL HISTORY: Past Surgical History:  Procedure Laterality Date  . ABDOMINAL HYSTERECTOMY  1985  . BREAST LUMPECTOMY Left 2000   radiation and chemo  . BREAST LUMPECTOMY Right 2016   radiation  . BREAST SURGERY  2001   lt breast lumpectomy  . RADIOACTIVE SEED GUIDED PARTIAL MASTECTOMY WITH AXILLARY SENTINEL LYMPH NODE BIOPSY Right 07/07/2015   Procedure: RIGHT RADIOACTIVE SEED GUIDED PARTIAL MASTECTOMY WITH AXILLARY SENTINEL LYMPH NODE BIOPSY;  Surgeon: Autumn Messing III, MD;  Location: [facility];  Service: General;  Laterality: Right;  . SMALL INTESTINE SURGERY    . TUBAL LIGATION      FAMILY HISTORY: Family History  Problem Relation Age of Onset  . Diabetes Father   . Lung cancer Sister        dx. <50; former smoker  . Diabetes Brother   . Diabetes Paternal Aunt   . Stroke Maternal Grandmother   . Diabetes Paternal Grandmother   . Emphysema Mother 30       smoker  . Diabetes Brother   . Brain cancer Brother 20       unknown tumor type  . Cancer Daughter 80       neck cancer  . Other Daughter        hysterectomy for unspecified reason  . Breast cancer  Cousin   . Cancer Cousin        unspecified type  . Breast cancer Other        triple negative breast cancer in her 92s  . Colon polyps Neg Hx   . Esophageal cancer Neg Hx   . Gallbladder disease Neg Hx     SOCIAL HISTORY:  Social History   Socioeconomic History  . Marital status: Divorced    Spouse name: Not on file  . Number of children: 7  . Years of education: Not on file  . Highest education level: Not on file  Occupational History  . Occupation:  retired  Scientific laboratory technician  . Financial resource strain: Not on file  . Food insecurity:    Worry: Not on file    Inability: Not on file  . Transportation needs:    Medical: Not on file    Non-medical: Not on file  Tobacco Use  . Smoking status: Former Smoker    Packs/day: 1.00    Years: 20.00    Pack years: 20.00    Types: Cigarettes    Last attempt to quit: 07/30/2011    Years since quitting: 6.5  . Smokeless tobacco: Never Used  . Tobacco comment: Quit >4 years ago; 1 ppd for about 5/20 years (remaining was less)  Substance and Sexual Activity  . Alcohol use: No    Alcohol/week: 0.0 oz  . Drug use: No  . Sexual activity: Not on file  Lifestyle  . Physical activity:    Days per week: Not on file    Minutes per session: Not on file  . Stress: Not on file  Relationships  . Social connections:    Talks on phone: Not on file    Gets together: Not on file    Attends religious service: Not on file    Active member of club or organization: Not on file    Attends meetings of clubs or organizations: Not on file    Relationship status: Not on file  . Intimate partner violence:    Fear of current or ex partner: Not on file    Emotionally abused: Not on file    Physically abused: Not on file    Forced sexual activity: Not on file  Other Topics Concern  . Not on file  Social History Narrative   Lives alone.  Retired.  Education:  11th grade GED.  Children:  7 (one here).      PHYSICAL EXAM  GENERAL  EXAM/CONSTITUTIONAL: Vitals:  Vitals:   03/02/18 1343  BP: 103/61  Pulse: 68  Weight: 192 lb 3.2 oz (87.2 kg)  Height: 5' (1.524 m)     Body mass index is 37.54 kg/m. Wt Readings from Last 3 Encounters:  03/02/18 192 lb 3.2 oz (87.2 kg)  01/19/18 189 lb 6 oz (85.9 kg)  11/05/17 188 lb 8 oz (85.5 kg)     Patient is in no distress; well developed, nourished and groomed; neck is supple  CARDIOVASCULAR:  Examination of carotid arteries is normal; no carotid bruits  Regular rate and rhythm, no murmurs  Examination of peripheral vascular system by observation and palpation is normal  EYES:  Ophthalmoscopic exam of optic discs and posterior segments is normal; no papilledema or hemorrhages  No exam data present  MUSCULOSKELETAL:  Gait, strength, tone, movements noted in Neurologic exam below  NEUROLOGIC: MENTAL STATUS:  No flowsheet data found.  awake, alert, oriented to person, place and time  recent and remote memory intact  normal attention and concentration  language fluent, comprehension intact, naming intact  fund of knowledge appropriate  CRANIAL NERVE:   2nd - no papilledema on fundoscopic exam  2nd, 3rd, 4th, 6th - pupils equal and reactive to light, visual fields full to confrontation, extraocular muscles intact, no nystagmus  5th - facial sensation symmetric  7th - facial strength symmetric  8th - hearing intact  9th - palate elevates symmetrically, uvula midline  11th - shoulder shrug symmetric  12th - tongue protrusion midline  MOTOR:   normal bulk and tone, full strength in the BUE, BLE  SENSORY:  normal and symmetric to light touch  COORDINATION:   finger-nose-finger, fine finger movements normal  REFLEXES:   deep tendon reflexes TRACE and symmetric  GAIT/STATION:   narrow based gait; able to walk tandem      DIAGNOSTIC DATA (LABS, IMAGING, TESTING) - I reviewed patient records, labs, notes, testing and imaging  myself where available.  Lab Results  Component Value Date   WBC 6.2 01/19/2018   HGB 9.0 (L) 01/19/2018   HCT 27.1 (L) 01/19/2018   MCV 87.3 01/19/2018   PLT 299.0 01/19/2018      Component Value Date/Time   NA 137 01/19/2018 1357   NA 139 07/13/2015 1516   K 3.9 01/19/2018 1357   K 3.9 07/13/2015 1516   CL 104 01/19/2018 1357   CO2 25 01/19/2018 1357   CO2 25 07/13/2015 1516   GLUCOSE 157 (H) 01/19/2018 1357   GLUCOSE 85 07/13/2015 1516   BUN 11 01/19/2018 1357   BUN 7.9 07/13/2015 1516   CREATININE 0.95 01/19/2018 1357   CREATININE 0.9 07/13/2015 1516   CALCIUM 8.8 01/19/2018 1357   CALCIUM 8.9 07/13/2015 1516   PROT 7.6 01/19/2018 1357   PROT 7.6 07/13/2015 1516   ALBUMIN 3.8 01/19/2018 1357   ALBUMIN 3.3 (L) 07/13/2015 1516   AST 17 01/19/2018 1357   AST 16 07/13/2015 1516   ALT 14 01/19/2018 1357   ALT 12 07/13/2015 1516   ALKPHOS 89 01/19/2018 1357   ALKPHOS 99 07/13/2015 1516   BILITOT 0.2 01/19/2018 1357   BILITOT <0.30 07/13/2015 1516   GFRNONAA 57 (L) 09/14/2015 0417   GFRNONAA 61 05/10/2014 1147   GFRAA >60 09/14/2015 0417   GFRAA 70 05/10/2014 1147   Lab Results  Component Value Date   CHOL 152 01/19/2018   HDL 58.60 01/19/2018   LDLCALC 65 01/19/2018   LDLDIRECT 154.7 04/05/2013   TRIG 139.0 01/19/2018   CHOLHDL 3 01/19/2018   Lab Results  Component Value Date   HGBA1C 6.8 (H) 01/19/2018   Lab Results  Component Value Date   VITAMINB12 718 08/29/2017   Lab Results  Component Value Date   TSH 0.81 01/19/2018    03/16/12 MRI brain - Normal MRI brain and IAC (with and without contrast). Incidental partially empty sella.   08/13/17 MRI brain [I reviewed images myself and agree with interpretation. -VRP]  - Stable since 2013 and normal noncontrast MRI appearance of the brain.  08/13/17 MRI cervical spine [I reviewed images myself and agree with interpretation. -VRP]  - Mild degenerative changes at C3-C4 and C5-C6 as described above. No  high-grade spinal canal or neuroforaminal stenosis at any level.  09/10/17 MRI lumbar spine [I reviewed images myself and agree with interpretation. -VRP]  - MRI scan of the lumbar spine showing prominent degenerative changes with left lateral disc osteophyte protrusions at L2-3, to L5-S1 resulting in moderate to severe foraminal narrowing most severe at L5-S1 where there may be involvement of the exiting nerve root on the left.  09/11/17 EMG.NCS - normal     ASSESSMENT AND PLAN  72 y.o. year old female here with several year history of intermittent "dizziness" which she describes as a general low energy sensation, fatigue, balance difficulty.  I suspect her symptoms are related to a combination of factors including deconditioning, depression, diabetic neuropathy.  Ddx balance difficulty: deconditioning, diabetic neuropathy, lumbar degenerative disease, mood disorder  1. Gait difficulty   2. Other fatigue   3. Sleep apnea in adult   4. Other  insomnia      PLAN:  GAIT DIFFICULTY (intermittent; stable) - supportive care - optimize nutrition, exercise, relaxation techniques; consider PT evaluation  SLEEP APNEA (stable, not treated) - consider CPAP (patient declines at this time)  ANXIETY / INSOMNIA (stable) - consider therapist / counselor for anxiety / insomnia  Return if symptoms worsen or fail to improve, for return to PCP.     Penni Bombard, MD 11/01/9975, 4:14 PM Certified in Neurology, Neurophysiology and Neuroimaging  [facility] [address], [place], [place] [address] [phone]

## 2018-03-02 NOTE — Patient Instructions (Signed)
  GAIT DIFFICULTY (intermittent; stable) - supportive care - optimize nutrition, exercise, relaxation techniques; consider PT evaluation  SLEEP APNEA (stable, not treated) - consider CPAP  ANXIETY / INSOMNIA (stable) - consider therapist / counselor for anxiety / insomnia

## 2018-03-12 ENCOUNTER — Telehealth: Payer: Self-pay | Admitting: Family Medicine

## 2018-03-12 MED ORDER — SERTRALINE HCL 50 MG PO TABS: ORAL_TABLET | ORAL | 0 refills | Status: DC

## 2018-03-12 NOTE — Telephone Encounter (Signed)
Copied from [place] [id number]. Topic: Quick Communication - Rx Refill/Question >> Mar 12, 2018  4:23 PM Sheran Luz wrote: Medication: sertraline (ZOLOFT) 50 MG tablet   Pt states that she is out of town and left her medication at home. Pt is requesting a refill be sent to her in [place]. Please advise.   Preferred Pharmacy (with phone number or street name): Address: [facility] [address], [place] [address] Phone: [phone]   Agent: Please be advised that RX refills may take up to 3 business days. We ask that you follow-up with your pharmacy.

## 2018-03-12 NOTE — Telephone Encounter (Signed)
Patient called and she says she is out of town with her daughter in the hospital and forgot to grab her Zoloft. She asks for a 30 day supply to be sent to [facility] in [place] to cover until she returns home.

## 2018-03-14 ENCOUNTER — Other Ambulatory Visit: Payer: Self-pay | Admitting: Family Medicine

## 2018-04-03 LAB — HM DIABETES EYE EXAM

## 2018-04-10 ENCOUNTER — Other Ambulatory Visit: Payer: Self-pay | Admitting: Family Medicine

## 2018-04-13 DIAGNOSIS — H2513 Age-related nuclear cataract, bilateral: Secondary | ICD-10-CM | POA: Diagnosis not present

## 2018-04-13 DIAGNOSIS — H40013 Open angle with borderline findings, low risk, bilateral: Secondary | ICD-10-CM | POA: Diagnosis not present

## 2018-04-14 ENCOUNTER — Other Ambulatory Visit: Payer: Self-pay | Admitting: Family Medicine

## 2018-05-07 ENCOUNTER — Other Ambulatory Visit: Payer: Self-pay | Admitting: Hematology and Oncology

## 2018-05-07 DIAGNOSIS — Z853 Personal history of malignant neoplasm of breast: Secondary | ICD-10-CM

## 2018-05-17 ENCOUNTER — Other Ambulatory Visit: Payer: Self-pay | Admitting: Family Medicine

## 2018-06-02 ENCOUNTER — Other Ambulatory Visit: Payer: Self-pay

## 2018-06-02 ENCOUNTER — Other Ambulatory Visit: Payer: Self-pay | Admitting: Hematology and Oncology

## 2018-06-02 DIAGNOSIS — Z853 Personal history of malignant neoplasm of breast: Secondary | ICD-10-CM

## 2018-06-03 ENCOUNTER — Encounter: Payer: Self-pay | Admitting: General Practice

## 2018-06-03 ENCOUNTER — Encounter: Payer: Self-pay | Admitting: Family Medicine

## 2018-06-03 ENCOUNTER — Ambulatory Visit (INDEPENDENT_AMBULATORY_CARE_PROVIDER_SITE_OTHER): Payer: Medicare Other | Admitting: Family Medicine

## 2018-06-03 ENCOUNTER — Other Ambulatory Visit: Payer: Self-pay

## 2018-06-03 VITALS — BP 122/78 | HR 78 | Temp 98.1°F | Resp 17 | Ht 60.0 in | Wt 195.5 lb

## 2018-06-03 DIAGNOSIS — Z0000 Encounter for general adult medical examination without abnormal findings: Secondary | ICD-10-CM | POA: Diagnosis not present

## 2018-06-03 DIAGNOSIS — M8580 Other specified disorders of bone density and structure, unspecified site: Secondary | ICD-10-CM | POA: Diagnosis not present

## 2018-06-03 DIAGNOSIS — E1140 Type 2 diabetes mellitus with diabetic neuropathy, unspecified: Secondary | ICD-10-CM

## 2018-06-03 LAB — CBC WITH DIFFERENTIAL/PLATELET
Basophils Absolute: 0.1 10*3/uL (ref 0.0–0.1)
Basophils Relative: 0.9 % (ref 0.0–3.0)
EOS PCT: 1.1 % (ref 0.0–5.0)
Eosinophils Absolute: 0.1 10*3/uL (ref 0.0–0.7)
HEMATOCRIT: 31.5 % — AB (ref 36.0–46.0)
HEMOGLOBIN: 10.4 g/dL — AB (ref 12.0–15.0)
LYMPHS PCT: 44.4 % (ref 12.0–46.0)
Lymphs Abs: 2.7 10*3/uL (ref 0.7–4.0)
MCHC: 33.1 g/dL (ref 30.0–36.0)
MCV: 91.1 fl (ref 78.0–100.0)
MONOS PCT: 5.4 % (ref 3.0–12.0)
Monocytes Absolute: 0.3 10*3/uL (ref 0.1–1.0)
NEUTROS PCT: 48.2 % (ref 43.0–77.0)
Neutro Abs: 3 10*3/uL (ref 1.4–7.7)
Platelets: 293 10*3/uL (ref 150.0–400.0)
RBC: 3.46 Mil/uL — AB (ref 3.87–5.11)
RDW: 13.8 % (ref 11.5–15.5)
WBC: 6.1 10*3/uL (ref 4.0–10.5)

## 2018-06-03 LAB — BASIC METABOLIC PANEL
BUN: 11 mg/dL (ref 6–23)
CALCIUM: 9.2 mg/dL (ref 8.4–10.5)
CO2: 27 mEq/L (ref 19–32)
CREATININE: 0.89 mg/dL (ref 0.40–1.20)
Chloride: 104 mEq/L (ref 96–112)
GFR: 79.69 mL/min (ref >60.00)
Glucose, Bld: 108 mg/dL — ABNORMAL HIGH (ref 70–99)
POTASSIUM: 3.9 meq/L (ref 3.5–5.1)
Sodium: 138 mEq/L (ref 135–145)

## 2018-06-03 LAB — LIPID PANEL
CHOL/HDL RATIO: 2
Cholesterol: 146 mg/dL (ref 0–200)
HDL: 59.4 mg/dL (ref >39.00)
LDL CALC: 67 mg/dL (ref 0–99)
NonHDL: 87.03
Triglycerides: 102 mg/dL (ref 0.0–149.0)
VLDL: 20.4 mg/dL (ref 0.0–40.0)

## 2018-06-03 LAB — HEPATIC FUNCTION PANEL
ALBUMIN: 3.9 g/dL (ref 3.5–5.2)
ALK PHOS: 83 U/L (ref 39–117)
ALT: 10 U/L (ref 0–35)
AST: 13 U/L (ref 0–37)
Bilirubin, Direct: 0.1 mg/dL (ref 0.0–0.3)
TOTAL PROTEIN: 8.1 g/dL (ref 6.0–8.3)
Total Bilirubin: 0.2 mg/dL (ref 0.2–1.2)

## 2018-06-03 LAB — VITAMIN D 25 HYDROXY (VIT D DEFICIENCY, FRACTURES): VITD: 35.64 ng/mL (ref 30.00–100.00)

## 2018-06-03 LAB — HEMOGLOBIN A1C: Hgb A1c MFr Bld: 7 % — ABNORMAL HIGH (ref 4.6–6.5)

## 2018-06-03 LAB — TSH: TSH: 1.51 u[IU]/mL (ref 0.35–4.50)

## 2018-06-03 MED ORDER — ALBUTEROL SULFATE HFA 108 (90 BASE) MCG/ACT IN AERS: 2.0000 | INHALATION_SPRAY | RESPIRATORY_TRACT | 6 refills | Status: DC | PRN

## 2018-06-03 MED ORDER — TRAMADOL HCL 50 MG PO TABS: 50.0000 mg | ORAL_TABLET | Freq: Two times a day (BID) | ORAL | 0 refills | Status: DC

## 2018-06-03 NOTE — Assessment & Plan Note (Signed)
Due for DEXA but not until December.  Check Vit D and replete prn.

## 2018-06-03 NOTE — Progress Notes (Signed)
   Subjective:    Patient ID: Tracy [name], female    DOB: Aug 23, 1945, 72 y.o.   MRN: [id number]  HPI CPE- UTD on colonoscopy.  Due for mammo (scheduled for tomorrow).  UTD on immunizations.  UTD on eye exam.  Due for foot exam.   Review of Systems Patient reports no vision/ hearing changes, adenopathy,fever, weight change,  persistant/recurrent hoarseness , swallowing issues, chest pain, palpitations, edema, persistant/recurrent cough, hemoptysis, dyspnea (rest/exertional/paroxysmal nocturnal), gastrointestinal bleeding (melena, rectal bleeding), abdominal pain, significant heartburn, bowel changes, GU symptoms (dysuria, hematuria, incontinence), Gyn symptoms (abnormal  bleeding, pain),  syncope, focal weakness, memory loss, numbness & tingling, skin/hair/nail changes, abnormal bruising or bleeding, anxiety, or depression.     Objective:   Physical Exam General Appearance:    Alert, cooperative, no distress, appears stated age  Head:    Normocephalic, without obvious abnormality, atraumatic  Eyes:    PERRL, conjunctiva/corneas clear, EOM's intact, fundi    benign, both eyes  Ears:    Normal TM's and external ear canals, both ears  Nose:   Nares normal, septum midline, mucosa normal, no drainage    or sinus tenderness  Throat:   Lips, mucosa, and tongue normal; teeth and gums normal  Neck:   Supple, symmetrical, trachea midline, no adenopathy;    Thyroid: no enlargement/tenderness/nodules  Back:     Symmetric, no curvature, ROM normal, no CVA tenderness  Lungs:     Clear to auscultation bilaterally, respirations unlabored  Chest Wall:    No tenderness or deformity   Heart:    Regular rate and rhythm, S1 and S2 normal, no murmur, rub   or gallop  Breast Exam:    Deferred to mammo  Abdomen:     Soft, non-tender, bowel sounds active all four quadrants,    no masses, no organomegaly  Genitalia:    Deferred  Rectal:    Extremities:   Extremities normal, atraumatic, no cyanosis or edema    Pulses:   2+ and symmetric all extremities  Skin:   Skin color, texture, turgor normal, no rashes or lesions  Lymph nodes:   Cervical, supraclavicular, and axillary nodes normal  Neurologic:   CNII-XII intact, normal strength, sensation and reflexes    throughout          Assessment & Plan:

## 2018-06-03 NOTE — Assessment & Plan Note (Signed)
Pt's PE WNL w/ exception of obesity.  UTD on colonoscopy.  mammo is tomorrow.  UTD on immunizations.  Check labs.  Anticipatory guidance provided.

## 2018-06-03 NOTE — Assessment & Plan Note (Signed)
Chronic problem.  Currently diet controlled.  UTD on eye exam, on ARB.  Foot exam done today.  Check labs.  Adjust tx prn

## 2018-06-03 NOTE — Patient Instructions (Signed)
Follow up in 3-4 months to recheck diabetes We'll notify you of your lab results and make any changes if needed Continue to work on healthy diet and regular exercise- you can do it! Call with any questions or concerns Happy Holidays!!! 

## 2018-06-04 ENCOUNTER — Ambulatory Visit
Admission: RE | Admit: 2018-06-04 | Discharge: 2018-06-04 | Disposition: A | Discharge status: Home / Self Care | Payer: Medicare Other | Source: Ambulatory Visit | Attending: Hematology and Oncology | Admitting: Hematology and Oncology

## 2018-06-04 ENCOUNTER — Ambulatory Visit: Payer: Medicare Other

## 2018-06-04 DIAGNOSIS — Z853 Personal history of malignant neoplasm of breast: Secondary | ICD-10-CM

## 2018-06-04 DIAGNOSIS — R928 Other abnormal and inconclusive findings on diagnostic imaging of breast: Secondary | ICD-10-CM | POA: Diagnosis not present

## 2018-06-29 ENCOUNTER — Other Ambulatory Visit: Payer: Self-pay | Admitting: General Practice

## 2018-06-29 MED ORDER — GABAPENTIN 100 MG PO CAPS: 200.0000 mg | ORAL_CAPSULE | Freq: Three times a day (TID) | ORAL | 1 refills | Status: DC

## 2018-06-29 NOTE — Telephone Encounter (Signed)
Please advise. Received a refill request for pt gabapentin to Mail order. This will be #[phone] for a 90 day supply. Ok to send?

## 2018-07-01 ENCOUNTER — Ambulatory Visit (INDEPENDENT_AMBULATORY_CARE_PROVIDER_SITE_OTHER): Payer: Medicare Other | Admitting: Family Medicine

## 2018-07-01 ENCOUNTER — Encounter: Payer: Self-pay | Admitting: Family Medicine

## 2018-07-01 ENCOUNTER — Other Ambulatory Visit: Payer: Self-pay

## 2018-07-01 VITALS — BP 122/74 | HR 73 | Temp 98.6°F | Ht 60.0 in | Wt 196.0 lb

## 2018-07-01 DIAGNOSIS — J029 Acute pharyngitis, unspecified: Secondary | ICD-10-CM

## 2018-07-01 DIAGNOSIS — J069 Acute upper respiratory infection, unspecified: Secondary | ICD-10-CM | POA: Diagnosis not present

## 2018-07-01 LAB — POCT RAPID STREP A (OFFICE): RAPID STREP A SCREEN: NEGATIVE

## 2018-07-01 MED ORDER — BENZONATATE 100 MG PO CAPS: 100.0000 mg | ORAL_CAPSULE | Freq: Two times a day (BID) | ORAL | 0 refills | Status: DC | PRN

## 2018-07-01 MED ORDER — AZITHROMYCIN 250 MG PO TABS: ORAL_TABLET | ORAL | 0 refills | Status: DC

## 2018-07-01 NOTE — Patient Instructions (Signed)
Please follow up if symptoms do not improve or as needed.    Upper Respiratory Infection, Adult Most upper respiratory infections (URIs) are a viral infection of the air passages leading to the lungs. A URI affects the nose, throat, and upper air passages. The most common type of URI is nasopharyngitis and is typically referred to as "the common cold." URIs run their course and usually go away on their own. Most of the time, a URI does not require medical attention, but sometimes a bacterial infection in the upper airways can follow a viral infection. This is called a secondary infection. Sinus and middle ear infections are common types of secondary upper respiratory infections. Bacterial pneumonia can also complicate a URI. A URI can worsen asthma and chronic obstructive pulmonary disease (COPD). Sometimes, these complications can require emergency medical care and may be life threatening. What are the causes? Almost all URIs are caused by viruses. A virus is a type of germ and can spread from one person to another. What increases the risk? You may be at risk for a URI if:  You smoke.  You have chronic heart or lung disease.  You have a weakened defense (immune) system.  You are very young or very old.  You have nasal allergies or asthma.  You work in crowded or poorly ventilated areas.  You work in health care facilities or schools.  What are the signs or symptoms? Symptoms typically develop 2-3 days after you come in contact with a cold virus. Most viral URIs last 7-10 days. However, viral URIs from the influenza virus (flu virus) can last 14-18 days and are typically more severe. Symptoms may include:  Runny or stuffy (congested) nose.  Sneezing.  Cough.  Sore throat.  Headache.  Fatigue.  Fever.  Loss of appetite.  Pain in your forehead, behind your eyes, and over your cheekbones (sinus pain).  Muscle aches.  How is this diagnosed? Your health care provider may  diagnose a URI by:  Physical exam.  Tests to check that your symptoms are not due to another condition such as: ? Strep throat. ? Sinusitis. ? Pneumonia. ? Asthma.  How is this treated? A URI goes away on its own with time. It cannot be cured with medicines, but medicines may be prescribed or recommended to relieve symptoms. Medicines may help:  Reduce your fever.  Reduce your cough.  Relieve nasal congestion.  Follow these instructions at home:  Take medicines only as directed by your health care provider.  Gargle warm saltwater or take cough drops to comfort your throat as directed by your health care provider.  Use a warm mist humidifier or inhale steam from a shower to increase air moisture. This may make it easier to breathe.  Drink enough fluid to keep your urine clear or pale yellow.  Eat soups and other clear broths and maintain good nutrition.  Rest as needed.  Return to work when your temperature has returned to normal or as your health care provider advises. You may need to stay home longer to avoid infecting others. You can also use a face mask and careful hand washing to prevent spread of the virus.  Increase the usage of your inhaler if you have asthma.  Do not use any tobacco products, including cigarettes, chewing tobacco, or electronic cigarettes. If you need help quitting, ask your health care provider. How is this prevented? The best way to protect yourself from getting a cold is to practice good hygiene.  Avoid oral or hand contact with people with cold symptoms.  Wash your hands often if contact occurs.  There is no clear evidence that vitamin C, vitamin E, echinacea, or exercise reduces the chance of developing a cold. However, it is always recommended to get plenty of rest, exercise, and practice good nutrition. Contact a health care provider if:  You are getting worse rather than better.  Your symptoms are not controlled by medicine.  You  have chills.  You have worsening shortness of breath.  You have brown or red mucus.  You have yellow or brown nasal discharge.  You have pain in your face, especially when you bend forward.  You have a fever.  You have swollen neck glands.  You have pain while swallowing.  You have white areas in the back of your throat. Get help right away if:  You have severe or persistent: ? Headache. ? Ear pain. ? Sinus pain. ? Chest pain.  You have chronic lung disease and any of the following: ? Wheezing. ? Prolonged cough. ? Coughing up blood. ? A change in your usual mucus.  You have a stiff neck.  You have changes in your: ? Vision. ? Hearing. ? Thinking. ? Mood. This information is not intended to replace advice given to you by your health care provider. Make sure you discuss any questions you have with your health care provider. Document Released: 01/08/2001 Document Revised: 03/17/2016 Document Reviewed: 10/20/2013 Elsevier Interactive Patient Education  [employer].

## 2018-07-01 NOTE — Progress Notes (Signed)
Subjective   CC:  Chief Complaint  Patient presents with  . Sore Throat    Nasal Congestion x 3-4 days     HPI: Tracy [name] is a 72 y.o. female who presents to the office today to address the problems listed above in the chief complaint.  Patient complains of typical URI symptoms including nasal congestion, mild sore throat, cough, and mild malaise.  The symptoms have been present for 3-4 days. She denies high fever or productive cough, shortness of breath or significant GI symptoms.  Over-the-counter cold medicines have been minimally or mildly helpful. She requests an abx. May have some sinus pressure.  Assessment  1. Viral URI   2. Sore throat      Plan   URI, viral: discussed dx; no sign or sx of bacterial infection is present. However given abx for possible sinusitis and pt request in spite of education. Treat supportively with antihistamines, decongestants, and/or cough meds. See AVS for care instructions.   Follow up: prn   Orders Placed This Encounter  Procedures  . POCT rapid strep A   Meds ordered this encounter  Medications  . azithromycin (ZITHROMAX) 250 MG tablet    Sig: Take 2 tabs today, then 1 tab daily for 4 days    Dispense:  1 each    Refill:  0  . benzonatate (TESSALON) 100 MG capsule    Sig: Take 1 capsule (100 mg total) by mouth 2 (two) times daily as needed for cough.    Dispense:  20 capsule    Refill:  0      I reviewed the patients updated PMH, FH, and SocHx.    Patient Active Problem List   Diagnosis Date Noted  . Type 2 diabetes mellitus with diabetic neuropathy, unspecified ([place]) 11/05/2017  . Encounter for long-term use of muscle relaxants 09/24/2017  . CAD in native artery 09/24/2017  . OSA (obstructive sleep apnea) 09/24/2017  . Snorings 09/24/2017  . Morbid obesity ([place]) 06/02/2017  . Depression 06/02/2017  . Iron deficiency anemia 09/23/2016  . Anemia of chronic disease 09/28/2015  . Genetic testing 08/21/2015  . History  of left breast cancer 07/13/2015  . History of right breast cancer 06/20/2015  . Hearing loss due to cerumen impaction 12/29/2014  . Allergy to adhesive tape 05/19/2014  . Hyperlipidemia 12/06/2013  . GERD (gastroesophageal reflux disease) 12/06/2013  . Cervical disc disease 11/11/2013  . Osteopenia 05/25/2013  . Allergic asthma 12/24/2012  . Insomnia 04/02/2012  . Physical exam, annual 04/02/2012  . HTN (hypertension) 02/03/2012  . Vertigo, benign positional 02/03/2012  . Left groin pain 02/03/2012   Current Meds  Medication Sig  . aspirin 81 MG tablet Take 81 mg by mouth daily.  Marland Kitchen atorvastatin (LIPITOR) 10 MG tablet TAKE 1 TABLET BY MOUTH  DAILY  . cyclobenzaprine (FLEXERIL) 10 MG tablet Take 1 tablet (10 mg total) by mouth 3 (three) times daily as needed for muscle spasms.  . ferrous sulfate 325 (65 FE) MG tablet Take 1 tablet (325 mg total) by mouth daily with breakfast.  . gabapentin (NEURONTIN) 100 MG capsule Take 2 capsules (200 mg total) by mouth 3 (three) times daily.  Marland Kitchen loratadine (CLARITIN) 10 MG tablet Take 10 mg by mouth daily.  Marland Kitchen losartan (COZAAR) 100 MG tablet TAKE 1 TABLET BY MOUTH  DAILY  . meclizine (ANTIVERT) 25 MG tablet TAKE 1 TABLET(25 MG) BY MOUTH THREE TIMES DAILY AS NEEDED FOR DIZZINESS  . omeprazole (PRILOSEC) 40  MG capsule TAKE 1 CAPSULE BY MOUTH TWO TIMES DAILY  . sertraline (ZOLOFT) 50 MG tablet TAKE 1 TABLET(50 MG) BY MOUTH DAILY  . traMADol (ULTRAM) 50 MG tablet Take 1 tablet (50 mg total) by mouth 2 (two) times daily.  . traZODone (DESYREL) 100 MG tablet TAKE 1 TABLET BY MOUTH AT  BEDTIME    Review of Systems: Constitutional: Negative for fever malaise or anorexia Cardiovascular: negative for chest pain Respiratory: negative for SOB or pleuritic chest pain Gastrointestinal: negative for abdominal pain  Objective  Vitals: BP 122/74   Pulse 73   Temp 98.6 F (37 C)   Ht 5' (1.524 m)   Wt 196 lb (88.9 kg)   SpO2 95%   BMI 38.28 kg/m    General: no acute respiratory distress  Psych:  Alert and oriented, normal mood and affect HEENT: Normocephalic, nasal congestion present, TMs w/o erythema, OP with erythema w/o exudate, + cervical LAD, supple neck  Cardiovascular:  RRR without murmur or gallop. no peripheral edema Respiratory:  Good breath sounds bilaterally, CTAB with normal respiratory effort Skin:  Warm, no rashes Neurologic:   Mental status is normal. normal gait  Commons side effects, risks, benefits, and alternatives for medications and treatment plan prescribed today were discussed, and the patient expressed understanding of the given instructions. Patient is instructed to call or message via MyChart if he/she has any questions or concerns regarding our treatment plan. No barriers to understanding were identified. We discussed Red Flag symptoms and signs in detail. Patient expressed understanding regarding what to do in case of urgent or emergency type symptoms.   Medication list was reconciled, printed and provided to the patient in AVS. Patient instructions and summary information was reviewed with the patient as documented in the AVS. This note was prepared with assistance of Dragon voice recognition software. Occasional wrong-word or sound-a-like substitutions may have occurred due to the inherent limitations of voice recognition software

## 2018-08-16 DIAGNOSIS — I10 Essential (primary) hypertension: Secondary | ICD-10-CM | POA: Diagnosis not present

## 2018-08-18 ENCOUNTER — Other Ambulatory Visit: Payer: Self-pay | Admitting: Family Medicine

## 2018-08-19 ENCOUNTER — Encounter: Payer: Self-pay | Admitting: Family Medicine

## 2018-08-19 ENCOUNTER — Other Ambulatory Visit: Payer: Self-pay

## 2018-08-19 ENCOUNTER — Ambulatory Visit (INDEPENDENT_AMBULATORY_CARE_PROVIDER_SITE_OTHER): Payer: Medicare Other | Admitting: Family Medicine

## 2018-08-19 VITALS — BP 134/84 | HR 87 | Temp 98.5°F | Resp 17 | Ht 60.0 in | Wt 190.5 lb

## 2018-08-19 DIAGNOSIS — I10 Essential (primary) hypertension: Secondary | ICD-10-CM | POA: Diagnosis not present

## 2018-08-19 DIAGNOSIS — R51 Headache: Secondary | ICD-10-CM

## 2018-08-19 DIAGNOSIS — R519 Headache, unspecified: Secondary | ICD-10-CM

## 2018-08-19 DIAGNOSIS — J309 Allergic rhinitis, unspecified: Secondary | ICD-10-CM | POA: Diagnosis not present

## 2018-08-19 MED ORDER — GABAPENTIN 100 MG PO CAPS: 200.0000 mg | ORAL_CAPSULE | Freq: Three times a day (TID) | ORAL | 1 refills | Status: DC

## 2018-08-19 NOTE — Assessment & Plan Note (Addendum)
Chronic problem.  Adequate control today despite elevated BPs in [place].  Asymptomatic at this time.  No need to change meds at this time.  Will continue to follow closely.

## 2018-08-19 NOTE — Progress Notes (Signed)
   Subjective:    Patient ID: Tracy [name], female    DOB: 03/24/1946, 73 y.o.   MRN: [id number]  HPI ER f/u- pt was in [place] and was having nosebleeds each morning between 5-6am.  Daughter was in the hospital and she asked nurse to check BP.  At that time (Sunday) it was 188/90.  Went to ER and it was '154 over something'.  Has had associated frontal HAs.  Pt was given 1 tab of BP meds in ER but no prescription.  Continues to have intermittent HAs.  No known nose bleed since coming home.    Review of Systems For ROS see HPI     Objective:   Physical Exam Vitals signs reviewed.  Constitutional:      General: She is not in acute distress.    Appearance: She is well-developed. She is obese.  HENT:     Head: Normocephalic and atraumatic.     Right Ear: Tympanic membrane normal.     Left Ear: Tympanic membrane normal.     Nose: Mucosal edema and rhinorrhea present.     Right Sinus: No maxillary sinus tenderness or frontal sinus tenderness.     Left Sinus: No maxillary sinus tenderness or frontal sinus tenderness.     Mouth/Throat:     Pharynx: Posterior oropharyngeal erythema (w/ PND) present.  Eyes:     Conjunctiva/sclera: Conjunctivae normal.     Pupils: Pupils are equal, round, and reactive to light.  Neck:     Musculoskeletal: Normal range of motion and neck supple.  Cardiovascular:     Rate and Rhythm: Normal rate and regular rhythm.     Heart sounds: Normal heart sounds.  Pulmonary:     Effort: Pulmonary effort is normal. No respiratory distress.     Breath sounds: Normal breath sounds. No wheezing or rales.  Lymphadenopathy:     Cervical: No cervical adenopathy.  Skin:    General: Skin is warm and dry.  Neurological:     General: No focal deficit present.     Mental Status: She is alert and oriented to person, place, and time.  Psychiatric:        Mood and Affect: Mood normal.        Behavior: Behavior normal.        Thought Content: Thought content normal.            Assessment & Plan:  Headache- new.  Pt's description is consistent w/ sinus pressure and tension headache.  Pt to restart Gabapentin- which she did not get when it was sent in on 12/2.  Tylenol and heat PRN.  Continue allergy tx.  Pt expressed understanding and is in agreement w/ plan.

## 2018-08-19 NOTE — Assessment & Plan Note (Signed)
Deteriorated.  Suspect this is the cause of her nose bleeds and her headaches behind her eyes that come and go.  Continue Claritin.  Add nasal saline spray.  Will follow.

## 2018-08-19 NOTE — Patient Instructions (Signed)
Follow up as scheduled (sooner if needed) No medication changes at this time.  BP looks ok The nose bleeds are likely due to dry air overnight combined w/ allergies Continue your Claritin daily Add nasal saline spray (OTC) to keep nostrils moist and prevent bleeding The headaches seem to be a combination of sinus pressure and tension from your neck.  Treating the allergies will help the sinus pressure and restarting the gabapentin and adding Tylenol as needed will help w/ neck pain HEAT the neck and shoulder for pain relief Call with any questions or concerns Happy New Year!!

## 2018-08-30 ENCOUNTER — Other Ambulatory Visit: Payer: Self-pay | Admitting: Family Medicine

## 2018-09-23 DIAGNOSIS — M5416 Radiculopathy, lumbar region: Secondary | ICD-10-CM | POA: Diagnosis not present

## 2018-09-28 ENCOUNTER — Other Ambulatory Visit: Payer: Self-pay

## 2018-09-28 ENCOUNTER — Encounter: Payer: Self-pay | Admitting: Family Medicine

## 2018-09-28 ENCOUNTER — Ambulatory Visit (INDEPENDENT_AMBULATORY_CARE_PROVIDER_SITE_OTHER): Payer: Medicare Other | Admitting: Family Medicine

## 2018-09-28 VITALS — BP 120/82 | HR 75 | Temp 98.1°F | Resp 16 | Ht 60.0 in | Wt 191.4 lb

## 2018-09-28 DIAGNOSIS — E1140 Type 2 diabetes mellitus with diabetic neuropathy, unspecified: Secondary | ICD-10-CM | POA: Diagnosis not present

## 2018-09-28 DIAGNOSIS — R0789 Other chest pain: Secondary | ICD-10-CM | POA: Diagnosis not present

## 2018-09-28 LAB — BASIC METABOLIC PANEL
BUN: 12 mg/dL (ref 6–23)
CHLORIDE: 105 meq/L (ref 96–112)
CO2: 26 meq/L (ref 19–32)
Calcium: 8.6 mg/dL (ref 8.4–10.5)
Creatinine, Ser: 0.92 mg/dL (ref 0.40–1.20)
GFR: 72.1 mL/min (ref >60.00)
Glucose, Bld: 99 mg/dL (ref 70–99)
POTASSIUM: 3.8 meq/L (ref 3.5–5.1)
Sodium: 139 mEq/L (ref 135–145)

## 2018-09-28 LAB — HEMOGLOBIN A1C: Hgb A1c MFr Bld: 6.6 % — ABNORMAL HIGH (ref 4.6–6.5)

## 2018-09-28 NOTE — Progress Notes (Signed)
   Subjective:    Patient ID: Tracy [name], female    DOB: April 26, 1946, 73 y.o.   MRN: [id number]  HPI DM- chronic problem, currently diet controlled.  UTD on foot exam, eye exam, and on Losartan for renal protection.  She is down 4 lbs since CPE in November.  'i'm feeling pretty good'.  Pt is having intermittent CP that radiates to L arm- 'it just happens'.  Doesn't occur w/ exertion.  Having increased gas issues.  Denies associated SOB.  No visual changes.  Denies abd pain, N/V.  No numbness/tingling since starting gabapentin.  Morbid obesity- ongoing issue for pt.  She is down 4 lbs since CPE.  BMI is 37.38  Given her DM, HTN, hyperlipidemia this classifies as morbidly obese  Review of Systems For ROS see HPI     Objective:   Physical Exam Vitals signs reviewed.  Constitutional:      General: She is not in acute distress.    Appearance: She is well-developed. She is obese.  HENT:     Head: Normocephalic and atraumatic.  Eyes:     Conjunctiva/sclera: Conjunctivae normal.     Pupils: Pupils are equal, round, and reactive to light.  Neck:     Musculoskeletal: Normal range of motion and neck supple.     Thyroid: No thyromegaly.  Cardiovascular:     Rate and Rhythm: Normal rate and regular rhythm.     Heart sounds: Normal heart sounds. No murmur.  Pulmonary:     Effort: Pulmonary effort is normal. No respiratory distress.     Breath sounds: Normal breath sounds.  Abdominal:     General: There is no distension.     Palpations: Abdomen is soft.     Tenderness: There is no abdominal tenderness.  Lymphadenopathy:     Cervical: No cervical adenopathy.  Skin:    General: Skin is warm and dry.  Neurological:     Mental Status: She is alert and oriented to person, place, and time.  Psychiatric:        Behavior: Behavior normal.           Assessment & Plan:  Atypical CP- new.  Pt reports CP is intermittent.  Does not occur w/ exertion and not associated w/ SOB.  She does have  multiple risk factors- obesity, age, DM, HTN, hyperlipidemia.  She feels this is related to gas- increased sxs recently.  EKG unchanged from previous.  Offered stress test.  Pt declined.  She was instructed that if CP recurs, she must notify me so cards referral/stress test can be placed.  Pt expressed understanding and is in agreement w/ plan.

## 2018-09-28 NOTE — Patient Instructions (Signed)
Follow up in 3-4 months to recheck diabetes, blood pressure and cholesterol We'll notify you of your lab results and make any changes if needed Continue to work on healthy diet and regular exercise- you can do it! If your chest pain continues, changes, or worsens, please let me know so we can send you for a stress test! EKG today is unchanged since 2016- this is good news! Call with any questions or concerns Happy Spring!

## 2018-09-29 NOTE — Assessment & Plan Note (Signed)
Ongoing issue for pt.  She is down 4 lbs since last visit.  Applauded her efforts.  Will continue to follow.

## 2018-09-29 NOTE — Assessment & Plan Note (Signed)
Chronic problem.  Diet controlled.  UTD on foot exam, eye exam, and on ARB for renal protection.  Applauded her 4 lb weight loss.  Check labs.  Adjust tx plan prn.

## 2018-10-12 ENCOUNTER — Other Ambulatory Visit: Payer: Self-pay

## 2018-10-12 ENCOUNTER — Ambulatory Visit (INDEPENDENT_AMBULATORY_CARE_PROVIDER_SITE_OTHER): Payer: Medicare Other

## 2018-10-12 ENCOUNTER — Encounter: Payer: Self-pay | Admitting: Family Medicine

## 2018-10-12 ENCOUNTER — Ambulatory Visit (INDEPENDENT_AMBULATORY_CARE_PROVIDER_SITE_OTHER): Payer: Medicare Other | Admitting: Family Medicine

## 2018-10-12 VITALS — BP 132/82 | HR 67 | Temp 98.6°F | Resp 16 | Ht 60.0 in | Wt 195.2 lb

## 2018-10-12 DIAGNOSIS — R058 Other specified cough: Secondary | ICD-10-CM

## 2018-10-12 DIAGNOSIS — R05 Cough: Secondary | ICD-10-CM

## 2018-10-12 DIAGNOSIS — J449 Chronic obstructive pulmonary disease, unspecified: Secondary | ICD-10-CM | POA: Diagnosis not present

## 2018-10-12 MED ORDER — MOMETASONE FUROATE 50 MCG/ACT NA SUSP: 2.0000 | Freq: Every day | NASAL | 12 refills | Status: DC

## 2018-10-12 NOTE — Patient Instructions (Addendum)
Please go to our [place] at [facility] to get your chest Xray Turn L out of the parking lot onto [address]  Go through 5 red lights and turn R on [address] (intersection of [facility] and [employer] [address]).  The office is at the 3rd light, on the L.  Tell them you are there for an xray We'll notify you of your xray results and make any changes if needed CONTINUE the Claritin daily, ADD the Nasonex daily Drink plenty of fluids MUCINEX DM for cough/congestion Call with any questions or concerns Hang in there!!

## 2018-10-12 NOTE — Progress Notes (Signed)
   Subjective:    Patient ID: Tracy [name], female    DOB: 31-Oct-1945, 73 y.o.   MRN: [id number]  HPI Cough- pt reports last week she had 'a piece of meatloaf stuck in my windpipe'.  She states she 'coughed and coughed and coughed so hard'.  Began coughing up 'mucous' 2 days later.  Started using inhaler w/ some improvement.  Cough is improving in frequency 'but it's a deep deep cough' when it occurs.  Cough is productive   Review of Systems For ROS see HPI     Objective:   Physical Exam Vitals signs reviewed.  Constitutional:      General: She is not in acute distress.    Appearance: Normal appearance. She is well-developed. She is not ill-appearing.  HENT:     Head: Normocephalic and atraumatic.     Right Ear: Tympanic membrane normal.     Left Ear: Tympanic membrane normal.     Nose: Mucosal edema and rhinorrhea present.     Right Sinus: No maxillary sinus tenderness or frontal sinus tenderness.     Left Sinus: No maxillary sinus tenderness or frontal sinus tenderness.     Mouth/Throat:     Pharynx: Posterior oropharyngeal erythema (w/ PND) present.  Eyes:     Conjunctiva/sclera: Conjunctivae normal.     Pupils: Pupils are equal, round, and reactive to light.  Neck:     Musculoskeletal: Normal range of motion and neck supple.  Cardiovascular:     Rate and Rhythm: Normal rate and regular rhythm.     Heart sounds: Normal heart sounds.  Pulmonary:     Effort: Pulmonary effort is normal. No respiratory distress.     Breath sounds: Normal breath sounds. No wheezing or rales.     Comments: Wet, hacking cough Lymphadenopathy:     Cervical: No cervical adenopathy.  Neurological:     Mental Status: She is alert.           Assessment & Plan:  Productive cough- new.  Will get CXR to assess for possible aspiration PNA.  If this is negative, suspect cough is due to viral/allergy combo.  Pt to continue Claritin, add Nasonex.  Cough meds prn.  Reviewed supportive care and red  flags that should prompt return.  Pt expressed understanding and is in agreement w/ plan.

## 2018-11-14 ENCOUNTER — Other Ambulatory Visit: Payer: Self-pay | Admitting: Family Medicine

## 2018-11-17 ENCOUNTER — Other Ambulatory Visit: Payer: Self-pay | Admitting: Family Medicine

## 2018-12-20 ENCOUNTER — Other Ambulatory Visit: Payer: Self-pay | Admitting: Family Medicine

## 2018-12-28 ENCOUNTER — Telehealth: Payer: Self-pay | Admitting: Family Medicine

## 2018-12-28 MED ORDER — MECLIZINE HCL 25 MG PO TABS: ORAL_TABLET | ORAL | 3 refills | Status: DC

## 2018-12-28 NOTE — Telephone Encounter (Signed)
Pt called in asking for a refill on the meclizine. Pt states that she is getting dizzy. She would like to know if it could be upped some. Pt can be reached at the home # and uses [facility] on [address].   She has an appt on Friday with [name]

## 2018-12-28 NOTE — Telephone Encounter (Signed)
Please advise 

## 2018-12-28 NOTE — Telephone Encounter (Signed)
I refilled the Meclizine but this is not something to go up on.  This is the highest dose I am comfortable with and it sounds like we may need further evaluation of dizziness.

## 2018-12-29 NOTE — Telephone Encounter (Signed)
Called and spoke with pt. She advised she wants to use the medication and will call back if anything changes. I also advised pt to increase her water intake as she said she barely drinks any during the day.

## 2019-01-01 ENCOUNTER — Encounter: Payer: Self-pay | Admitting: Family Medicine

## 2019-01-01 ENCOUNTER — Ambulatory Visit (INDEPENDENT_AMBULATORY_CARE_PROVIDER_SITE_OTHER): Payer: Medicare Other | Admitting: Family Medicine

## 2019-01-01 ENCOUNTER — Other Ambulatory Visit: Payer: Self-pay

## 2019-01-01 VITALS — Ht 60.0 in | Wt 190.0 lb

## 2019-01-01 DIAGNOSIS — E785 Hyperlipidemia, unspecified: Secondary | ICD-10-CM | POA: Diagnosis not present

## 2019-01-01 DIAGNOSIS — E1140 Type 2 diabetes mellitus with diabetic neuropathy, unspecified: Secondary | ICD-10-CM

## 2019-01-01 DIAGNOSIS — I10 Essential (primary) hypertension: Secondary | ICD-10-CM | POA: Diagnosis not present

## 2019-01-01 DIAGNOSIS — F341 Dysthymic disorder: Secondary | ICD-10-CM

## 2019-01-01 NOTE — Progress Notes (Signed)
Virtual Visit via Telephone Note  I connected with Sagan A Keegan on 01/01/19 at  2:00 PM EDT by telephone and verified that I am speaking with the correct person using two identifiers.  Pt does not have video capability  Location: Patient: home Provider: office   I discussed the limitations, risks, security and privacy concerns of performing an evaluation and management service by telephone and the availability of in person appointments. I also discussed with the patient that there may be a patient responsible charge related to this service. The patient expressed understanding and agreed to proceed.   History of Present Illness: HTN- chronic problem, on Losartan 100mg  daily.  No way to take BP today as pt doesn't understand how to use cuff.  Denies CP, SOB, HAs, visual changes, edema.  Hyperlipidemia- chronic problem, on Lipitor 10mg  daily.  No abd pain, N/V.  DM- chronic problem, currently diet controlled.  On ARB for renal protection.  UTD on eye exam, foot exam.  No numbness/tingling of hands/feet.  Depression- pt reports that she is 'going through some stuff' with her family.  No thoughts of self harm.   Observations/Objective: Pt is able to speak clearly, coherently without shortness of breath or increased work of breathing.  Thought process is linear.  Mood is appropriate.   Assessment and Plan: HTN- chronic problem.  Pt has hx of good control but unable to check BP today.  Currently asymptomatic.  Will check labs but no anticipated med changes.  Hyperlipidemia- chronic problem.  Tolerating statin w/o difficulty.  Encouraged healthy diet and regular exercise.  Check labs.  Adjust meds prn   DM- chronic problem.  Diet controlled.  On ARB for renal protection.  UTD on foot exam, eye exam.  Encouraged healthy diet and regular exercise.  Check labs,  Adjust tx plan prn.  Depression- deteriorated.  Pt is very stressed about a situation with her daughter.  Increase Sertraline to  100mg  daily.  Will follow closely and have another visit in 3-4 weeks to make sure sxs are improving.  Pt is able to contract for safety today.   Follow Up Instructions:    I discussed the assessment and treatment plan with the patient. The patient was provided an opportunity to ask questions and all were answered. The patient agreed with the plan and demonstrated an understanding of the instructions.   The patient was advised to call back or seek an in-person evaluation if the symptoms worsen or if the condition fails to improve as anticipated.  I provided 12 minutes of non-face-to-face time during this encounter.   [name], MD

## 2019-01-04 ENCOUNTER — Ambulatory Visit (INDEPENDENT_AMBULATORY_CARE_PROVIDER_SITE_OTHER): Payer: Medicare Other

## 2019-01-04 ENCOUNTER — Other Ambulatory Visit: Payer: Self-pay

## 2019-01-04 VITALS — BP 118/60

## 2019-01-04 DIAGNOSIS — E785 Hyperlipidemia, unspecified: Secondary | ICD-10-CM | POA: Diagnosis not present

## 2019-01-04 DIAGNOSIS — E1140 Type 2 diabetes mellitus with diabetic neuropathy, unspecified: Secondary | ICD-10-CM

## 2019-01-04 DIAGNOSIS — I10 Essential (primary) hypertension: Secondary | ICD-10-CM | POA: Diagnosis not present

## 2019-01-04 LAB — HEPATIC FUNCTION PANEL
ALT: 11 U/L (ref 0–35)
AST: 14 U/L (ref 0–37)
Albumin: 3.9 g/dL (ref 3.5–5.2)
Alkaline Phosphatase: 96 U/L (ref 39–117)
Bilirubin, Direct: 0.1 mg/dL (ref 0.0–0.3)
Total Bilirubin: 0.3 mg/dL (ref 0.2–1.2)
Total Protein: 8.3 g/dL (ref 6.0–8.3)

## 2019-01-04 LAB — BASIC METABOLIC PANEL
BUN: 13 mg/dL (ref 6–23)
CO2: 27 mEq/L (ref 19–32)
Calcium: 9.2 mg/dL (ref 8.4–10.5)
Chloride: 103 mEq/L (ref 96–112)
Creatinine, Ser: 0.96 mg/dL (ref 0.40–1.20)
GFR: 68.6 mL/min (ref >60.00)
Glucose, Bld: 86 mg/dL (ref 70–99)
Potassium: 4.3 mEq/L (ref 3.5–5.1)
Sodium: 138 mEq/L (ref 135–145)

## 2019-01-04 LAB — LIPID PANEL
Cholesterol: 152 mg/dL (ref 0–200)
HDL: 57 mg/dL (ref >39.00)
LDL Cholesterol: 75 mg/dL (ref 0–99)
NonHDL: 95.17
Total CHOL/HDL Ratio: 3
Triglycerides: 103 mg/dL (ref 0.0–149.0)
VLDL: 20.6 mg/dL (ref 0.0–40.0)

## 2019-01-04 LAB — CBC WITH DIFFERENTIAL/PLATELET
Basophils Absolute: 0.1 10*3/uL (ref 0.0–0.1)
Basophils Relative: 1.4 % (ref 0.0–3.0)
Eosinophils Absolute: 0.1 10*3/uL (ref 0.0–0.7)
Eosinophils Relative: 1.5 % (ref 0.0–5.0)
HCT: 31.6 % — ABNORMAL LOW (ref 36.0–46.0)
Hemoglobin: 10.4 g/dL — ABNORMAL LOW (ref 12.0–15.0)
Lymphocytes Relative: 46.2 % — ABNORMAL HIGH (ref 12.0–46.0)
Lymphs Abs: 3.3 10*3/uL (ref 0.7–4.0)
MCHC: 32.8 g/dL (ref 30.0–36.0)
MCV: 91.5 fl (ref 78.0–100.0)
Monocytes Absolute: 0.4 10*3/uL (ref 0.1–1.0)
Monocytes Relative: 5.6 % (ref 3.0–12.0)
Neutro Abs: 3.2 10*3/uL (ref 1.4–7.7)
Neutrophils Relative %: 45.3 % (ref 43.0–77.0)
Platelets: 251 10*3/uL (ref 150.0–400.0)
RBC: 3.45 Mil/uL — ABNORMAL LOW (ref 3.87–5.11)
RDW: 14.4 % (ref 11.5–15.5)
WBC: 7.1 10*3/uL (ref 4.0–10.5)

## 2019-01-04 LAB — TSH: TSH: 1.5 u[IU]/mL (ref 0.35–4.50)

## 2019-01-04 LAB — HEMOGLOBIN A1C: Hgb A1c MFr Bld: 7.7 % — ABNORMAL HIGH (ref 4.6–6.5)

## 2019-01-05 ENCOUNTER — Other Ambulatory Visit: Payer: Self-pay | Admitting: Family Medicine

## 2019-01-12 DIAGNOSIS — M25552 Pain in left hip: Secondary | ICD-10-CM | POA: Diagnosis not present

## 2019-01-13 ENCOUNTER — Other Ambulatory Visit: Payer: Self-pay | Admitting: Family Medicine

## 2019-01-20 ENCOUNTER — Other Ambulatory Visit: Payer: Self-pay | Admitting: Family Medicine

## 2019-02-02 ENCOUNTER — Telehealth: Payer: Self-pay | Admitting: Family Medicine

## 2019-02-02 NOTE — Telephone Encounter (Signed)
LM asking pt to schedule a VV with [name] for a f/up on depression. Please schedule this week for next week.

## 2019-03-12 ENCOUNTER — Encounter: Payer: Self-pay | Admitting: Family Medicine

## 2019-03-12 ENCOUNTER — Other Ambulatory Visit: Payer: Self-pay

## 2019-03-12 ENCOUNTER — Ambulatory Visit (INDEPENDENT_AMBULATORY_CARE_PROVIDER_SITE_OTHER): Payer: Medicare Other | Admitting: Family Medicine

## 2019-03-12 VITALS — BP 131/71 | HR 77

## 2019-03-12 DIAGNOSIS — E1140 Type 2 diabetes mellitus with diabetic neuropathy, unspecified: Secondary | ICD-10-CM | POA: Diagnosis not present

## 2019-03-12 DIAGNOSIS — F341 Dysthymic disorder: Secondary | ICD-10-CM | POA: Diagnosis not present

## 2019-03-12 NOTE — Progress Notes (Signed)
Virtual Visit via Video   I connected with patient on 03/12/19 at 11:00 AM EDT by a video enabled telemedicine application and verified that I am speaking with the correct person using two identifiers.  Location patient: Home Location provider: [facility], Office Persons participating in the virtual visit: Patient, Provider, Williams (Jess B)  I discussed the limitations of evaluation and management by telemedicine and the availability of in person appointments. The patient expressed understanding and agreed to proceed.  Interactive audio and video telecommunications were attempted between this provider and patient, however failed, due to patient having technical difficulties OR patient did not have access to video capability.  We continued and completed visit with audio only.   Subjective:   HPI:   Depression- ongoing issue.  At last visit, pt was very stressed about a family situation.  At that time we discussed increasing Sertraline to 100mg  daily.  Since then, she has been able to visit 2 daughters in [place], [place]- 'I had a great time'.  Pt did not notice a difference when she increased to 100mg  and the pharmacy refilled the 50mg  again.  Pt feels that she's doing ok on the 50mg .  Neuropathy- pt reports she is now having shooting pains in her hands.  Doesn't feel that the 200mg  TID gabapentin is working.  ROS:   See pertinent positives and negatives per HPI.  Patient Active Problem List   Diagnosis Date Noted  . Allergic rhinitis 08/19/2018  . Type 2 diabetes mellitus with diabetic neuropathy, unspecified ([place]) 11/05/2017  . Encounter for long-term use of muscle relaxants 09/24/2017  . CAD in native artery 09/24/2017  . OSA (obstructive sleep apnea) 09/24/2017  . Snorings 09/24/2017  . Morbid obesity ([place]) 06/02/2017  . Depression 06/02/2017  . Iron deficiency anemia 09/23/2016  . Anemia of chronic disease 09/28/2015  . Genetic testing 08/21/2015  . History of left breast  cancer 07/13/2015  . History of right breast cancer 06/20/2015  . Hearing loss due to cerumen impaction 12/29/2014  . Allergy to adhesive tape 05/19/2014  . Hyperlipidemia 12/06/2013  . GERD (gastroesophageal reflux disease) 12/06/2013  . Cervical disc disease 11/11/2013  . Osteopenia 05/25/2013  . Allergic asthma 12/24/2012  . Insomnia 04/02/2012  . Physical exam, annual 04/02/2012  . HTN (hypertension) 02/03/2012  . Vertigo, benign positional 02/03/2012  . Left groin pain 02/03/2012    Social History   Tobacco Use  . Smoking status: Former Smoker    Packs/day: 1.00    Years: 20.00    Pack years: 20.00    Types: Cigarettes    Quit date: 07/30/2011    Years since quitting: 7.6  . Smokeless tobacco: Never Used  . Tobacco comment: Quit >4 years ago; 1 ppd for about 5/20 years (remaining was less)  Substance Use Topics  . Alcohol use: No    Alcohol/week: 0.0 standard drinks    Current Outpatient Medications:  .  acetaminophen-codeine (TYLENOL #3) 300-30 MG tablet, Take by mouth every 4 (four) hours as needed for moderate pain., Disp: , Rfl:  .  albuterol (PROAIR HFA) 108 (90 Base) MCG/ACT inhaler, Inhale 2 puffs into the lungs every 4 (four) hours as needed for wheezing., Disp: 1 Inhaler, Rfl: 6 .  aspirin 81 MG tablet, Take 81 mg by mouth daily., Disp: , Rfl:  .  atorvastatin (LIPITOR) 10 MG tablet, TAKE 1 TABLET BY MOUTH  DAILY, Disp: 90 tablet, Rfl: 1 .  cyclobenzaprine (FLEXERIL) 10 MG tablet, Take 1 tablet (10  mg total) by mouth 3 (three) times daily as needed for muscle spasms., Disp: 45 tablet, Rfl: 1 .  ferrous sulfate 325 (65 FE) MG tablet, Take 1 tablet (325 mg total) by mouth daily with breakfast., Disp: 30 tablet, Rfl: 6 .  gabapentin (NEURONTIN) 100 MG capsule, TAKE 2 CAPSULES BY MOUTH 3  TIMES DAILY, Disp: 540 capsule, Rfl: 1 .  loratadine (CLARITIN) 10 MG tablet, Take 10 mg by mouth daily., Disp: , Rfl:  .  losartan (COZAAR) 100 MG tablet, TAKE 1 TABLET BY MOUTH   DAILY, Disp: 90 tablet, Rfl: 1 .  meclizine (ANTIVERT) 25 MG tablet, TAKE 1 TABLET(25 MG) BY MOUTH THREE TIMES DAILY AS NEEDED FOR DIZZINESS, Disp: 30 tablet, Rfl: 3 .  mometasone (NASONEX) 50 MCG/ACT nasal spray, Place 2 sprays into the nose daily., Disp: 17 g, Rfl: 12 .  omeprazole (PRILOSEC) 40 MG capsule, TAKE 1 CAPSULE BY MOUTH TWO TIMES DAILY, Disp: 180 capsule, Rfl: 1 .  sertraline (ZOLOFT) 50 MG tablet, TAKE 1 TABLET(50 MG) BY MOUTH DAILY, Disp: 90 tablet, Rfl: 0 .  traZODone (DESYREL) 100 MG tablet, TAKE 1 TABLET BY MOUTH AT  BEDTIME, Disp: 90 tablet, Rfl: 1  Allergies  Allergen Reactions  . Bacitracin-Neomycin-Polymyxin  [Neomycin-Bacitracin Zn-Polymyx] Swelling  . Nsaids Other (See Comments)    nosebleeds  . Tape Hives    Pt cannot tolerate bandaids, tape, or any other adhesives.   . Ambien [Zolpidem Tartrate]     Hives   . Amoxicillin     Rash   . Contrast Media [Iodinated Diagnostic Agents] Hives    Pt states hives with prior ct, was given benadryl to resolve  . Latex Swelling  . Prednisone     Objective:   BP 131/71   Pulse 77   Pt is able to speak clearly, coherently without shortness of breath or increased work of breathing.  Thought process is linear.  Mood is appropriate.   Assessment and Plan:   Depression- improved since visiting her daughters.  Feels that sxs are stable at 50mg  and she is not interested in trying the 100mg  daily.  Will continue to follow.  Neuropathy- deteriorated.  Pt reports she is now having 'shooting pains' in her hands bilaterally.  Will increase her Gabapentin to 300mg  TID and she will let me know if this is helping.  If it is, we will change her prescription to 300mg  1 tab TID.  Pt expressed understanding and is in agreement w/ plan.    [name], MD 03/12/2019  Time spent with the patient: 12 minutes, of which >50% was spent in obtaining information about symptoms, reviewing previous labs, evaluations, and treatments,  counseling about condition (please see the discussed topics above), and developing a plan to further investigate it; had a number of questions which I addressed.

## 2019-03-12 NOTE — Progress Notes (Signed)
I have discussed the procedure for the virtual visit with the patient who has given consent to proceed with assessment and treatment.   I had received a refill request from pharmacy for 50mg  and refilled on 01/20/19. Pt states she was only able to take 100mg  for a few days. So she is not sure if she felt better on the higher dose.   Pt states that she is feeling better, she did visit her daughters in [place] and [place].    Davis Gourd, CMA

## 2019-03-17 ENCOUNTER — Other Ambulatory Visit: Payer: Self-pay | Admitting: Family Medicine

## 2019-04-20 ENCOUNTER — Other Ambulatory Visit: Payer: Self-pay | Admitting: General Practice

## 2019-04-20 MED ORDER — SERTRALINE HCL 50 MG PO TABS: ORAL_TABLET | ORAL | 0 refills | Status: DC

## 2019-05-10 ENCOUNTER — Other Ambulatory Visit: Payer: Self-pay | Admitting: Hematology and Oncology

## 2019-05-10 DIAGNOSIS — Z853 Personal history of malignant neoplasm of breast: Secondary | ICD-10-CM

## 2019-05-12 ENCOUNTER — Other Ambulatory Visit: Payer: Self-pay | Admitting: Family Medicine

## 2019-05-20 ENCOUNTER — Other Ambulatory Visit: Payer: Self-pay

## 2019-05-20 ENCOUNTER — Encounter: Payer: Self-pay | Admitting: Family Medicine

## 2019-05-20 ENCOUNTER — Ambulatory Visit (INDEPENDENT_AMBULATORY_CARE_PROVIDER_SITE_OTHER): Payer: Medicare Other | Admitting: Family Medicine

## 2019-05-20 VITALS — BP 121/78 | HR 75 | Temp 97.8°F | Resp 16 | Ht 60.0 in | Wt 198.1 lb

## 2019-05-20 DIAGNOSIS — R29898 Other symptoms and signs involving the musculoskeletal system: Secondary | ICD-10-CM | POA: Diagnosis not present

## 2019-05-20 DIAGNOSIS — E1140 Type 2 diabetes mellitus with diabetic neuropathy, unspecified: Secondary | ICD-10-CM | POA: Diagnosis not present

## 2019-05-20 LAB — HEMOGLOBIN A1C: Hgb A1c MFr Bld: 7.3 % — ABNORMAL HIGH (ref 4.6–6.5)

## 2019-05-20 LAB — BASIC METABOLIC PANEL
BUN: 13 mg/dL (ref 6–23)
CO2: 25 mEq/L (ref 19–32)
Calcium: 9.2 mg/dL (ref 8.4–10.5)
Chloride: 105 mEq/L (ref 96–112)
Creatinine, Ser: 1.05 mg/dL (ref 0.40–1.20)
GFR: 61.8 mL/min (ref >60.00)
Glucose, Bld: 106 mg/dL — ABNORMAL HIGH (ref 70–99)
Potassium: 4.4 mEq/L (ref 3.5–5.1)
Sodium: 136 mEq/L (ref 135–145)

## 2019-05-20 LAB — HM DIABETES EYE EXAM

## 2019-05-20 NOTE — Progress Notes (Signed)
   Subjective:    Patient ID: Tracy [name], female    DOB: 1945-09-08, 73 y.o.   MRN: [id number]  HPI DM- chronic problem, last A1C 7.7.  Still trying to control w/ diet and exercise but has gained 8 lbs since last visit.  Needs to schedule eye exam. Due for foot exam.  On ARB for renal protection.  No blurry or double vision but does note eye 'heaviness'.  No CP, SOB, abd pain, vomiting, dizziness.  No HAs above baseline.  Known neuropathy- increased dose of neurontin is helping.  'ankle weakness'- pt reports she hit her L foot while going up the stairs.  No fall but 'this set things off' and she had 'weakness' for ~1 week.  L>R No dizziness.  Some nausea.  Currently resolved.  Feels similar to previous episodes- has seen neurology (Dr Leta Baptist). Pt reports some shooting pains through tops of feet.   Review of Systems For ROS see HPI     Objective:   Physical Exam Vitals reviewed.  Constitutional:      General: She is not in acute distress.    Appearance: She is well-developed. She is obese.  HENT:     Head: Normocephalic and atraumatic.  Eyes:     Conjunctiva/sclera: Conjunctivae normal.     Pupils: Pupils are equal, round, and reactive to light.  Neck:     Thyroid: No thyromegaly.  Cardiovascular:     Rate and Rhythm: Normal rate and regular rhythm.     Pulses: Normal pulses.     Heart sounds: Normal heart sounds. No murmur.  Pulmonary:     Effort: Pulmonary effort is normal. No respiratory distress.     Breath sounds: Normal breath sounds.  Abdominal:     General: There is no distension.     Palpations: Abdomen is soft.     Tenderness: There is no abdominal tenderness.  Musculoskeletal:        General: No tenderness (no pain on either foot) or signs of injury.     Cervical back: Normal range of motion and neck supple.  Lymphadenopathy:     Cervical: No cervical adenopathy.  Skin:    General: Skin is warm and dry.  Neurological:     Mental Status: She is alert and  oriented to person, place, and time.     Gait: Gait normal.  Psychiatric:        Behavior: Behavior normal.           Assessment & Plan:  Leg Weakness- difficult to assess today b/c she is asymptomatic at this time.  She has seen neurology previously for this.  Unclear if she has true weakness or her neuropathy is confusing things.  Will continue to follow.

## 2019-05-20 NOTE — Patient Instructions (Addendum)
Schedule your complete physical in 3-4 months We'll notify you of your lab results and make any changes if needed Continue to work on healthy diet and regular exercise- you can do it! Call and schedule your eye exam Update me on your hip pain Call with any questions or concerns Stay Safe!  Stay Healthy!

## 2019-05-21 ENCOUNTER — Encounter: Payer: Self-pay | Admitting: General Practice

## 2019-06-06 ENCOUNTER — Other Ambulatory Visit: Payer: Self-pay | Admitting: Family Medicine

## 2019-06-07 ENCOUNTER — Ambulatory Visit
Admission: RE | Admit: 2019-06-07 | Discharge: 2019-06-07 | Disposition: A | Discharge status: Home / Self Care | Payer: Medicare Other | Source: Ambulatory Visit | Attending: Hematology and Oncology | Admitting: Hematology and Oncology

## 2019-06-07 ENCOUNTER — Other Ambulatory Visit: Payer: Self-pay

## 2019-06-07 DIAGNOSIS — R928 Other abnormal and inconclusive findings on diagnostic imaging of breast: Secondary | ICD-10-CM | POA: Diagnosis not present

## 2019-06-07 DIAGNOSIS — Z853 Personal history of malignant neoplasm of breast: Secondary | ICD-10-CM

## 2019-06-07 HISTORY — DX: Personal history of irradiation: Z92.3

## 2019-06-10 DIAGNOSIS — M533 Sacrococcygeal disorders, not elsewhere classified: Secondary | ICD-10-CM | POA: Diagnosis not present

## 2019-06-10 DIAGNOSIS — M25552 Pain in left hip: Secondary | ICD-10-CM | POA: Diagnosis not present

## 2019-06-11 DIAGNOSIS — H40013 Open angle with borderline findings, low risk, bilateral: Secondary | ICD-10-CM | POA: Diagnosis not present

## 2019-06-11 DIAGNOSIS — Z83511 Family history of glaucoma: Secondary | ICD-10-CM | POA: Diagnosis not present

## 2019-06-11 DIAGNOSIS — H2513 Age-related nuclear cataract, bilateral: Secondary | ICD-10-CM | POA: Diagnosis not present

## 2019-06-11 DIAGNOSIS — E119 Type 2 diabetes mellitus without complications: Secondary | ICD-10-CM | POA: Diagnosis not present

## 2019-06-29 ENCOUNTER — Other Ambulatory Visit: Payer: Self-pay | Admitting: Family Medicine

## 2019-07-06 DIAGNOSIS — M533 Sacrococcygeal disorders, not elsewhere classified: Secondary | ICD-10-CM | POA: Diagnosis not present

## 2019-07-14 ENCOUNTER — Telehealth: Payer: Self-pay

## 2019-07-14 MED ORDER — SERTRALINE HCL 50 MG PO TABS: ORAL_TABLET | ORAL | 0 refills | Status: DC

## 2019-07-14 NOTE — Telephone Encounter (Signed)
Called and spoke with pt. She is scheduled for 11:30 tomorrow.

## 2019-07-14 NOTE — Telephone Encounter (Signed)
Patient called in stating that she needs the medication for her leg muscle spasms sent in. Please advsie

## 2019-07-14 NOTE — Telephone Encounter (Signed)
Please verify that pt is referring to the Gabapentin and if so, let her know there are refills available

## 2019-07-14 NOTE — Addendum Note (Signed)
Addended by: Davis Gourd on: 07/14/2019 01:52 PM   Modules accepted: Orders

## 2019-07-14 NOTE — Telephone Encounter (Signed)
I believe pt is referring to gabapentin. If so it was filled November with 3 refills. Please advise

## 2019-07-14 NOTE — Telephone Encounter (Signed)
Flexeril is not the preferred medication for the sxs she is having and this puts her at an increased risk of falls.  I encourage her to schedule an appt to discuss

## 2019-07-14 NOTE — Telephone Encounter (Signed)
Pt stated that she was referring to the [facility] Rx that she had received in 2013. States that it is making it hard to stay asleep at night.

## 2019-07-15 ENCOUNTER — Other Ambulatory Visit: Payer: Self-pay

## 2019-07-15 ENCOUNTER — Ambulatory Visit (INDEPENDENT_AMBULATORY_CARE_PROVIDER_SITE_OTHER): Payer: Medicare Other | Admitting: Family Medicine

## 2019-07-15 ENCOUNTER — Encounter: Payer: Self-pay | Admitting: Family Medicine

## 2019-07-15 VITALS — BP 145/72 | HR 90 | Temp 97.8°F | Ht 60.0 in | Wt 198.0 lb

## 2019-07-15 DIAGNOSIS — M62838 Other muscle spasm: Secondary | ICD-10-CM | POA: Diagnosis not present

## 2019-07-15 MED ORDER — TIZANIDINE HCL 4 MG PO TABS: 4.0000 mg | ORAL_TABLET | Freq: Every day | ORAL | 0 refills | Status: DC

## 2019-07-15 NOTE — Progress Notes (Signed)
Virtual Visit via Video   I connected with patient on 07/15/19 at 11:30 AM EST by a video enabled telemedicine application and verified that I am speaking with the correct person using two identifiers.  Location patient: Home Location provider: Acup[facility]urist, Office Persons participating in the virtual visit: Patient, Provider, [place] (Jess B)  I discussed the limitations of evaluation and management by telemedicine and the availability of in person appointments. The patient expressed understanding and agreed to proceed.   Subjective:   HPI:  Leg spasm- pt reports she is having leg spasms 'that remind her of that time I had to go into the hospital'.  'it depends on what I do during the day'.  If she does a lot of walking or stairs or activity she develops spasms that 'pulls foot and ankle to 1 side'.  When they start, she has to start walking to stretch it out.  Very painful.  Pt has reportedly increased water intake recently due to strong smell urine.  'I can walk it off and then get back in the bed and they start right back up'.  It alternates between legs, never both at the same time, and R>L.  sxs started ~1 week ago.  Pt is eating bananas daily.  ROS:   See pertinent positives and negatives per HPI.  Patient Active Problem List   Diagnosis Date Noted  . Allergic rhinitis 08/19/2018  . Type 2 diabetes mellitus with diabetic neuropathy, unspecified ([place]) 11/05/2017  . Encounter for long-term use of muscle relaxants 09/24/2017  . CAD in native artery 09/24/2017  . OSA (obstructive sleep apnea) 09/24/2017  . Snorings 09/24/2017  . Morbid obesity ([place]) 06/02/2017  . Depression 06/02/2017  . Iron deficiency anemia 09/23/2016  . Anemia of chronic disease 09/28/2015  . Genetic testing 08/21/2015  . History of left breast cancer 07/13/2015  . History of right breast cancer 06/20/2015  . Hearing loss due to cerumen impaction 12/29/2014  . Allergy to adhesive tape 05/19/2014  .  Hyperlipidemia 12/06/2013  . GERD (gastroesophageal reflux disease) 12/06/2013  . Cervical disc disease 11/11/2013  . Osteopenia 05/25/2013  . Allergic asthma 12/24/2012  . Insomnia 04/02/2012  . Physical exam, annual 04/02/2012  . HTN (hypertension) 02/03/2012  . Vertigo, benign positional 02/03/2012  . Left groin pain 02/03/2012    Social History   Tobacco Use  . Smoking status: Former Smoker    Packs/day: 1.00    Years: 20.00    Pack years: 20.00    Types: Cigarettes    Quit date: 07/30/2011    Years since quitting: 7.9  . Smokeless tobacco: Never Used  . Tobacco comment: Quit >4 years ago; 1 ppd for about 5/20 years (remaining was less)  Substance Use Topics  . Alcohol use: No    Alcohol/week: 0.0 standard drinks    Current Outpatient Medications:  .  acetaminophen-codeine (TYLENOL #3) 300-30 MG tablet, Take by mouth every 4 (four) hours as needed for moderate pain., Disp: , Rfl:  .  albuterol (PROAIR HFA) 108 (90 Base) MCG/ACT inhaler, Inhale 2 puffs into the lungs every 4 (four) hours as needed for wheezing., Disp: 1 Inhaler, Rfl: 6 .  aspirin 81 MG tablet, Take 81 mg by mouth daily., Disp: , Rfl:  .  atorvastatin (LIPITOR) 10 MG tablet, TAKE 1 TABLET BY MOUTH  DAILY, Disp: 90 tablet, Rfl: 3 .  ferrous sulfate 325 (65 FE) MG tablet, Take 1 tablet (325 mg total) by mouth daily with breakfast.,  Disp: 30 tablet, Rfl: 6 .  gabapentin (NEURONTIN) 100 MG capsule, TAKE 2 CAPSULES BY MOUTH 3  TIMES DAILY, Disp: 540 capsule, Rfl: 3 .  loratadine (CLARITIN) 10 MG tablet, Take 10 mg by mouth daily., Disp: , Rfl:  .  losartan (COZAAR) 100 MG tablet, TAKE 1 TABLET BY MOUTH  DAILY, Disp: 90 tablet, Rfl: 3 .  meclizine (ANTIVERT) 25 MG tablet, TAKE 1 TABLET(25 MG) BY MOUTH THREE TIMES DAILY AS NEEDED FOR DIZZINESS, Disp: 30 tablet, Rfl: 3 .  mometasone (NASONEX) 50 MCG/ACT nasal spray, Place 2 sprays into the nose daily., Disp: 17 g, Rfl: 12 .  omeprazole (PRILOSEC) 40 MG capsule, TAKE 1  CAPSULE BY MOUTH  TWICE DAILY, Disp: 180 capsule, Rfl: 3 .  sertraline (ZOLOFT) 50 MG tablet, TAKE 1 TABLET(50 MG) BY MOUTH DAILY, Disp: 90 tablet, Rfl: 0 .  traZODone (DESYREL) 100 MG tablet, TAKE 1 TABLET BY MOUTH AT  BEDTIME, Disp: 90 tablet, Rfl: 1  Allergies  Allergen Reactions  . Bacitracin-Neomycin-Polymyxin  [Neomycin-Bacitracin Zn-Polymyx] Swelling  . Nsaids Other (See Comments)    nosebleeds  . Tape Hives    Pt cannot tolerate bandaids, tape, or any other adhesives.   . Ambien [Zolpidem Tartrate]     Hives   . Amoxicillin     Rash   . Contrast Media [Iodinated Diagnostic Agents] Hives    Pt states hives with prior ct, was given benadryl to resolve  . Latex Swelling  . Prednisone     Objective:   BP (!) 145/72   Pulse 90   Temp 97.8 F (36.6 C) (Temporal)   Ht 5' (1.524 m)   Wt 198 lb (89.8 kg)   BMI 38.67 kg/m   AAOx3, NAD NCAT, EOMI No obvious CN deficits Coloring WNL Pt is able to speak clearly, coherently without shortness of breath or increased work of breathing.  Thought process is linear.  Mood is appropriate.   Assessment and Plan:   Muscle spasms- pt has had hx of this but had done well for a number of years.  Is again having severe sxs.  She reports sxs correlate w/ activity level during the day.  Encouraged her to increase her water intake even though she reports doing a good job of this.  If no improvement w/ tizanidine, will need to check labs to assess electrolytes.  Pt expressed understanding and is in agreement w/ plan.   [name], MD 07/15/2019

## 2019-07-15 NOTE — Progress Notes (Signed)
I have discussed the procedure for the virtual visit with the patient who has given consent to proceed with assessment and treatment.   Tracy [name] L Tracy [name], CMA     

## 2019-08-11 ENCOUNTER — Other Ambulatory Visit: Payer: Self-pay | Admitting: General Practice

## 2019-08-11 MED ORDER — TIZANIDINE HCL 4 MG PO TABS: 4.0000 mg | ORAL_TABLET | Freq: Every day | ORAL | 3 refills | Status: DC

## 2019-08-20 ENCOUNTER — Other Ambulatory Visit: Payer: Self-pay

## 2019-08-20 ENCOUNTER — Ambulatory Visit (INDEPENDENT_AMBULATORY_CARE_PROVIDER_SITE_OTHER): Payer: Medicare Other | Admitting: Family Medicine

## 2019-08-20 ENCOUNTER — Encounter: Payer: Self-pay | Admitting: Family Medicine

## 2019-08-20 VITALS — BP 140/79 | HR 82 | Temp 97.9°F | Resp 16 | Ht 60.0 in | Wt 203.0 lb

## 2019-08-20 DIAGNOSIS — I10 Essential (primary) hypertension: Secondary | ICD-10-CM | POA: Diagnosis not present

## 2019-08-20 DIAGNOSIS — E1140 Type 2 diabetes mellitus with diabetic neuropathy, unspecified: Secondary | ICD-10-CM | POA: Diagnosis not present

## 2019-08-20 DIAGNOSIS — Z0000 Encounter for general adult medical examination without abnormal findings: Secondary | ICD-10-CM

## 2019-08-20 DIAGNOSIS — E785 Hyperlipidemia, unspecified: Secondary | ICD-10-CM | POA: Diagnosis not present

## 2019-08-20 LAB — LIPID PANEL
Cholesterol: 147 mg/dL (ref 0–200)
HDL: 53.7 mg/dL (ref >39.00)
LDL Cholesterol: 65 mg/dL (ref 0–99)
NonHDL: 93.32
Total CHOL/HDL Ratio: 3
Triglycerides: 141 mg/dL (ref 0.0–149.0)
VLDL: 28.2 mg/dL (ref 0.0–40.0)

## 2019-08-20 LAB — CBC WITH DIFFERENTIAL/PLATELET
Basophils Absolute: 0.1 10*3/uL (ref 0.0–0.1)
Basophils Relative: 1.1 % (ref 0.0–3.0)
Eosinophils Absolute: 0.1 10*3/uL (ref 0.0–0.7)
Eosinophils Relative: 1 % (ref 0.0–5.0)
HCT: 28 % — ABNORMAL LOW (ref 36.0–46.0)
Hemoglobin: 9.3 g/dL — ABNORMAL LOW (ref 12.0–15.0)
Lymphocytes Relative: 40.2 % (ref 12.0–46.0)
Lymphs Abs: 2.9 10*3/uL (ref 0.7–4.0)
MCHC: 33.1 g/dL (ref 30.0–36.0)
MCV: 94.7 fl (ref 78.0–100.0)
Monocytes Absolute: 0.4 10*3/uL (ref 0.1–1.0)
Monocytes Relative: 5.5 % (ref 3.0–12.0)
Neutro Abs: 3.8 10*3/uL (ref 1.4–7.7)
Neutrophils Relative %: 52.2 % (ref 43.0–77.0)
Platelets: 247 10*3/uL (ref 150.0–400.0)
RBC: 2.95 Mil/uL — ABNORMAL LOW (ref 3.87–5.11)
RDW: 14.6 % (ref 11.5–15.5)
WBC: 7.2 10*3/uL (ref 4.0–10.5)

## 2019-08-20 LAB — BASIC METABOLIC PANEL
BUN: 17 mg/dL (ref 6–23)
CO2: 24 mEq/L (ref 19–32)
Calcium: 8.7 mg/dL (ref 8.4–10.5)
Chloride: 105 mEq/L (ref 96–112)
Creatinine, Ser: 1 mg/dL (ref 0.40–1.20)
GFR: 65.33 mL/min (ref >60.00)
Glucose, Bld: 149 mg/dL — ABNORMAL HIGH (ref 70–99)
Potassium: 4.2 mEq/L (ref 3.5–5.1)
Sodium: 137 mEq/L (ref 135–145)

## 2019-08-20 LAB — HEMOGLOBIN A1C: Hgb A1c MFr Bld: 8.4 % — ABNORMAL HIGH (ref 4.6–6.5)

## 2019-08-20 LAB — HEPATIC FUNCTION PANEL
ALT: 13 U/L (ref 0–35)
AST: 16 U/L (ref 0–37)
Albumin: 3.6 g/dL (ref 3.5–5.2)
Alkaline Phosphatase: 76 U/L (ref 39–117)
Bilirubin, Direct: 0.2 mg/dL (ref 0.0–0.3)
Total Bilirubin: 0.2 mg/dL (ref 0.2–1.2)
Total Protein: 8.8 g/dL — ABNORMAL HIGH (ref 6.0–8.3)

## 2019-08-20 LAB — TSH: TSH: 1.85 u[IU]/mL (ref 0.35–4.50)

## 2019-08-20 NOTE — Assessment & Plan Note (Signed)
Chronic problem.  Has been able to control w/ diet and exercise although pt has admitted to doing poorly on both.  UTD on eye exam.  Foot exam done today.  On ARB for renal protection.  Check labs and determine if meds are needed

## 2019-08-20 NOTE — Patient Instructions (Addendum)
Follow up in 3-4 months to recheck diabetes We'll notify you of your lab results and make any changes if needed Continue to work on healthy diet and regular exercise- you can do it! Schedule your COVID vaccine through [facility] or the Health Dept Call with any questions or concerns Stay Safe!  Stay Healthy!   Preventive Care 76 Years and Older, Female Preventive care refers to lifestyle choices and visits with your health care provider that can promote health and wellness. This includes:  A yearly physical exam. This is also called an annual well check.  Regular dental and eye exams.  Immunizations.  Screening for certain conditions.  Healthy lifestyle choices, such as diet and exercise. What can I expect for my preventive care visit? Physical exam Your health care provider will check:  Height and weight. These may be used to calculate body mass index (BMI), which is a measurement that tells if you are at a healthy weight.  Heart rate and blood pressure.  Your skin for abnormal spots. Counseling Your health care provider may ask you questions about:  Alcohol, tobacco, and drug use.  Emotional well-being.  Home and relationship well-being.  Sexual activity.  Eating habits.  History of falls.  Memory and ability to understand (cognition).  Work and work Statistician.  Pregnancy and menstrual history. What immunizations do I need?  Influenza (flu) vaccine  This is recommended every year. Tetanus, diphtheria, and pertussis (Tdap) vaccine  You may need a Td booster every 10 years. Varicella (chickenpox) vaccine  You may need this vaccine if you have not already been vaccinated. Zoster (shingles) vaccine  You may need this after age 64. Pneumococcal conjugate (PCV13) vaccine  One dose is recommended after age 81. Pneumococcal polysaccharide (PPSV23) vaccine  One dose is recommended after age 76. Measles, mumps, and rubella (MMR) vaccine  You may need at  least one dose of MMR if you were born in 1957 or later. You may also need a second dose. Meningococcal conjugate (MenACWY) vaccine  You may need this if you have certain conditions. Hepatitis A vaccine  You may need this if you have certain conditions or if you travel or work in places where you may be exposed to hepatitis A. Hepatitis B vaccine  You may need this if you have certain conditions or if you travel or work in places where you may be exposed to hepatitis B. Haemophilus influenzae type b (Hib) vaccine  You may need this if you have certain conditions. You may receive vaccines as individual doses or as more than one vaccine together in one shot (combination vaccines). Talk with your health care provider about the risks and benefits of combination vaccines. What tests do I need? Blood tests  Lipid and cholesterol levels. These may be checked every 5 years, or more frequently depending on your overall health.  Hepatitis C test.  Hepatitis B test. Screening  Lung cancer screening. You may have this screening every year starting at age 65 if you have a 30-pack-year history of smoking and currently smoke or have quit within the past 15 years.  Colorectal cancer screening. All adults should have this screening starting at age 23 and continuing until age 54. Your health care provider may recommend screening at age 29 if you are at increased risk. You will have tests every 1-10 years, depending on your results and the type of screening test.  Diabetes screening. This is done by checking your blood sugar (glucose) after you have not  eaten for a while (fasting). You may have this done every 1-3 years.  Mammogram. This may be done every 1-2 years. Talk with your health care provider about how often you should have regular mammograms.  BRCA-related cancer screening. This may be done if you have a family history of breast, ovarian, tubal, or peritoneal cancers. Other tests  Sexually  transmitted disease (STD) testing.  Bone density scan. This is done to screen for osteoporosis. You may have this done starting at age 17. Follow these instructions at home: Eating and drinking  Eat a diet that includes fresh fruits and vegetables, whole grains, lean protein, and low-fat dairy products. Limit your intake of foods with high amounts of sugar, saturated fats, and salt.  Take vitamin and mineral supplements as recommended by your health care provider.  Do not drink alcohol if your health care provider tells you not to drink.  If you drink alcohol: ? Limit how much you have to 0-1 drink a day. ? Be aware of how much alcohol is in your drink. In the [place] one drink equals one 12 oz bottle of beer (355 mL), one 5 oz glass of wine (148 mL), or one 1 oz glass of hard liquor (44 mL). Lifestyle  Take daily care of your teeth and gums.  Stay active. Exercise for at least 30 minutes on 5 or more days each week.  Do not use any products that contain nicotine or tobacco, such as cigarettes, e-cigarettes, and chewing tobacco. If you need help quitting, ask your health care provider.  If you are sexually active, practice safe sex. Use a condom or other form of protection in order to prevent STIs (sexually transmitted infections).  Talk with your health care provider about taking a low-dose aspirin or statin. What's next?  Go to your health care provider once a year for a well check visit.  Ask your health care provider how often you should have your eyes and teeth checked.  Stay up to date on all vaccines. This information is not intended to replace advice given to you by your health care provider. Make sure you discuss any questions you have with your health care provider. Document Revised: 07/09/2018 Document Reviewed: 07/09/2018 Elsevier Patient Education  2020 [employer].

## 2019-08-20 NOTE — Assessment & Plan Note (Signed)
Chronic problem.  BP is mildly elevated today but pt hasn't taken medication.  Asymptomatic.  Check labs.  No anticipated med changes.  Will follow.

## 2019-08-20 NOTE — Progress Notes (Signed)
Subjective:    Patient ID: Tracy [name], female    DOB: 21-Jul-1946, 74 y.o.   MRN: [id number]  HPI Here today for MWV and CPE.  Risk Factors: DM- has managed to control w/ diet and exercise.  Last A1C 7.3  UTD on eye exam, on ARB for renal protection.  Due for foot exam.  Denies sores, blisters.   Hyperlipidemia- chronic problem, on Lipitor 10mg  daily.  No regular exercise.  Denies abd pain, N/V HTN- chronic problem, on Losartan 100mg  daily.  BP is mildly elevated today but pt has not taken BP meds yet this AM.  No CP, SOB, HAs, visual changes, edema Obesity- pt has gained 5 lbs since last visit.  No regular exercise.  Admits to quarantine eating Physical Activity: no regular exercise Fall Risk: low Depression: ongoing issue, on Sertraline w/ good control Hearing: mildly decreased to conversational tones ADL's: independent Cognitive: normal linear thought process, memory and attention intact Home Safety: safe at home Height, Weight, BMI, Visual Acuity: see vitals, vision corrected to 20/20 w/ glasses Counseling: UTD on mammo, colonoscopy, immunizations. Labs Ordered: See A&P Care Plan: See A&P   Health Maintenance  Topic Date Due  . FOOT EXAM  06/04/2019  . Hepatitis C Screening  01/01/2020 (Originally August 23, 1945)  . HEMOGLOBIN A1C  11/18/2019  . OPHTHALMOLOGY EXAM  05/19/2020  . COLONOSCOPY  07/29/2021  . TETANUS/TDAP  04/06/2023  . INFLUENZA VACCINE  Completed  . DEXA SCAN  Completed  . PNA vac Low Risk Adult  Completed   Patient Care Team    Relationship Specialty Notifications Start End  [name], MD PCP - General Family Medicine  02/03/12    Comment: Lyda Jester, MD Attending Physician Physical Medicine and Rehabilitation  05/31/15   [name], MD Consulting Physician Orthopedic Surgery  05/31/15   [name], MD Consulting Physician Otolaryngology  05/31/15   [name], MD Consulting Physician Gastroenterology  06/01/15   [name],  MD Consulting Physician General Surgery  10/20/15   [name], MD Consulting Physician Hematology and Oncology  10/20/15   [name], MD Consulting Physician Radiation Oncology  10/20/15   [name], NP Nurse Practitioner Hematology and Oncology  10/20/15    Comment: Survivorship      Review of Systems Patient reports no vision/ hearing changes, adenopathy,fever, weight change,  persistant/recurrent hoarseness , swallowing issues, chest pain, palpitations, edema, persistant/recurrent cough, hemoptysis, dyspnea (rest/exertional/paroxysmal nocturnal), gastrointestinal bleeding (melena, rectal bleeding), abdominal pain, significant heartburn, bowel changes, GU symptoms (dysuria, hematuria, incontinence), Gyn symptoms (abnormal  bleeding, pain),  syncope, focal weakness, memory loss, numbness & tingling, skin/hair/nail changes, abnormal bruising or bleeding, anxiety, or depression.   Bilateral heel pain- plans to go to podiatry, improves w/ tylenol  This visit occurred during the SARS-CoV-2 public health emergency.  Safety protocols were in place, including screening questions prior to the visit, additional usage of staff PPE, and extensive cleaning of exam room while observing appropriate contact time as indicated for disinfecting solutions.     Objective:   Physical Exam General Appearance:    Alert, cooperative, no distress, appears stated age, obese  Head:    Normocephalic, without obvious abnormality, atraumatic  Eyes:    PERRL, conjunctiva/corneas clear, EOM's intact, fundi    benign, both eyes  Ears:    Normal TM's and external ear canals, both ears  Nose:   Deferred due to COVID  Throat:   Neck:   Supple, symmetrical, trachea  midline, no adenopathy;    Thyroid: no enlargement/tenderness/nodules  Back:     Symmetric, no curvature, ROM normal, no CVA tenderness  Lungs:     Clear to auscultation bilaterally, respirations unlabored  Chest Wall:    No tenderness or  deformity   Heart:    Regular rate and rhythm, S1 and S2 normal, no murmur, rub   or gallop  Breast Exam:    Deferred to mammo  Abdomen:     Soft, non-tender, bowel sounds active all four quadrants,    no masses, no organomegaly  Genitalia:    Deferred  Rectal:    Extremities:   Extremities normal, atraumatic, no cyanosis or edema  Pulses:   2+ and symmetric all extremities  Skin:   Skin color, texture, turgor normal, no rashes or lesions  Lymph nodes:   Cervical, supraclavicular, and axillary nodes normal  Neurologic:   CNII-XII intact, normal strength, sensation and reflexes    throughout          Assessment & Plan:

## 2019-08-20 NOTE — Assessment & Plan Note (Signed)
Ongoing issue.  Pt has gained 5 lbs since last visit.  Encouraged her to work on [employer] and regular exercise.  Will follow.

## 2019-08-20 NOTE — Assessment & Plan Note (Signed)
Pt's PE WNL w/ exception of obesity.  UTD on mammo, colonoscopy, immunizations.  Check labs.  Encouraged healthy diet and regular exercise.  Reviewed meds individually w/ pt and explained what each is for- pt appreciative.

## 2019-08-20 NOTE — Assessment & Plan Note (Signed)
Chronic problem.  Tolerating statin w/o difficulty.  Stressed need for healthy diet and regular exercise.  Check labs.  Adjust meds prn  

## 2019-08-23 ENCOUNTER — Telehealth: Payer: Self-pay | Admitting: Family Medicine

## 2019-08-23 ENCOUNTER — Other Ambulatory Visit: Payer: Self-pay | Admitting: Family Medicine

## 2019-08-23 DIAGNOSIS — D649 Anemia, unspecified: Secondary | ICD-10-CM

## 2019-08-23 DIAGNOSIS — E1140 Type 2 diabetes mellitus with diabetic neuropathy, unspecified: Secondary | ICD-10-CM

## 2019-08-23 MED ORDER — METFORMIN HCL 500 MG PO TABS: 500.0000 mg | ORAL_TABLET | Freq: Two times a day (BID) | ORAL | 0 refills | Status: DC

## 2019-08-23 NOTE — Telephone Encounter (Signed)
Please advise? Pt is not due for 2 years.

## 2019-08-23 NOTE — Telephone Encounter (Signed)
Pt called inquiring when her next colonoscopy is due, she thinks she is overdue. If she is due, she would like an order placed for [facility].  Pt [phone]

## 2019-08-23 NOTE — Telephone Encounter (Signed)
Spoke with patient, given date of last colonoscopy. No current issues, just concerns surrounding her daughter being treated for colon cancer and other 2 daughters having polyps.  Pt agreed to do Cologuard and said that Dr. Birdie Riddle was arranging this, she thinks this will help ease her mind.

## 2019-08-23 NOTE — Telephone Encounter (Signed)
Please let pt know that according to our records she is not yet due (last w/ Dr Earlean Shawl in 2013).  If she has a specific concern, we can refer to GI for that particular issue

## 2019-08-23 NOTE — Telephone Encounter (Signed)
Called patient and LMOVM to return call.     

## 2019-08-24 NOTE — Telephone Encounter (Signed)
Please advise 

## 2019-08-25 NOTE — Telephone Encounter (Signed)
Given family hx of colon cancer, cologuard is not an appropriate test.  She needs a GI referral (has seen [facility] in the past) for family hx colon cancer

## 2019-08-25 NOTE — Telephone Encounter (Signed)
Advised pt that we sent her an IFOB to rule out GI bleeding. That we were trying to rule out anemia. Pt stated an understanding.

## 2019-09-07 ENCOUNTER — Other Ambulatory Visit: Payer: Self-pay

## 2019-09-07 ENCOUNTER — Other Ambulatory Visit (INDEPENDENT_AMBULATORY_CARE_PROVIDER_SITE_OTHER): Payer: Medicare Other

## 2019-09-07 DIAGNOSIS — D649 Anemia, unspecified: Secondary | ICD-10-CM

## 2019-09-07 LAB — FECAL OCCULT BLOOD, IMMUNOCHEMICAL: Fecal Occult Bld: NEGATIVE

## 2019-09-09 ENCOUNTER — Ambulatory Visit: Payer: Medicare Other

## 2019-09-13 ENCOUNTER — Other Ambulatory Visit: Payer: Self-pay

## 2019-09-13 ENCOUNTER — Ambulatory Visit (INDEPENDENT_AMBULATORY_CARE_PROVIDER_SITE_OTHER): Payer: Medicare Other | Admitting: *Deleted

## 2019-09-13 DIAGNOSIS — E1140 Type 2 diabetes mellitus with diabetic neuropathy, unspecified: Secondary | ICD-10-CM | POA: Diagnosis not present

## 2019-09-13 LAB — BASIC METABOLIC PANEL
BUN: 16 mg/dL (ref 6–23)
CO2: 24 mEq/L (ref 19–32)
Calcium: 8.9 mg/dL (ref 8.4–10.5)
Chloride: 103 mEq/L (ref 96–112)
Creatinine, Ser: 1.07 mg/dL (ref 0.40–1.20)
GFR: 60.41 mL/min (ref >60.00)
Glucose, Bld: 207 mg/dL — ABNORMAL HIGH (ref 70–99)
Potassium: 4.1 mEq/L (ref 3.5–5.1)
Sodium: 133 mEq/L — ABNORMAL LOW (ref 135–145)

## 2019-09-14 ENCOUNTER — Encounter: Payer: Self-pay | Admitting: General Practice

## 2019-10-11 ENCOUNTER — Other Ambulatory Visit: Payer: Self-pay | Admitting: General Practice

## 2019-10-11 MED ORDER — SERTRALINE HCL 50 MG PO TABS: ORAL_TABLET | ORAL | 0 refills | Status: DC

## 2019-10-12 DIAGNOSIS — R42 Dizziness and giddiness: Secondary | ICD-10-CM | POA: Insufficient documentation

## 2019-10-12 DIAGNOSIS — R0982 Postnasal drip: Secondary | ICD-10-CM | POA: Insufficient documentation

## 2019-10-27 ENCOUNTER — Telehealth: Payer: Self-pay | Admitting: Family Medicine

## 2019-10-27 NOTE — Progress Notes (Signed)
  Chronic Care Management   Outreach Note  10/27/2019 Name: Tracy [name] MRN: [id number] DOB: [birth year]  Referred by: [name], MD Reason for referral : No chief complaint on file.   An unsuccessful telephone outreach was attempted today. The patient was referred to the pharmacist for assistance with care management and care coordination.   Follow Up Plan:   Earney [place] [facility]stream Scheduler

## 2019-11-01 DIAGNOSIS — R42 Dizziness and giddiness: Secondary | ICD-10-CM | POA: Diagnosis not present

## 2019-11-01 DIAGNOSIS — J329 Chronic sinusitis, unspecified: Secondary | ICD-10-CM | POA: Diagnosis not present

## 2019-11-01 DIAGNOSIS — R0982 Postnasal drip: Secondary | ICD-10-CM | POA: Diagnosis not present

## 2019-11-03 ENCOUNTER — Telehealth (INDEPENDENT_AMBULATORY_CARE_PROVIDER_SITE_OTHER): Payer: Medicare Other | Admitting: Family Medicine

## 2019-11-03 ENCOUNTER — Encounter: Payer: Self-pay | Admitting: Family Medicine

## 2019-11-03 ENCOUNTER — Telehealth: Payer: Self-pay

## 2019-11-03 ENCOUNTER — Other Ambulatory Visit: Payer: Self-pay

## 2019-11-03 VITALS — BP 117/61 | HR 82 | Temp 97.3°F | Ht 60.0 in | Wt 197.0 lb

## 2019-11-03 DIAGNOSIS — J42 Unspecified chronic bronchitis: Secondary | ICD-10-CM

## 2019-11-03 DIAGNOSIS — K219 Gastro-esophageal reflux disease without esophagitis: Secondary | ICD-10-CM

## 2019-11-03 DIAGNOSIS — J309 Allergic rhinitis, unspecified: Secondary | ICD-10-CM | POA: Diagnosis not present

## 2019-11-03 DIAGNOSIS — E1140 Type 2 diabetes mellitus with diabetic neuropathy, unspecified: Secondary | ICD-10-CM

## 2019-11-03 MED ORDER — GABAPENTIN 300 MG PO CAPS: 300.0000 mg | ORAL_CAPSULE | Freq: Three times a day (TID) | ORAL | 1 refills | Status: DC

## 2019-11-03 MED ORDER — PANTOPRAZOLE SODIUM 40 MG PO TBEC: 40.0000 mg | DELAYED_RELEASE_TABLET | Freq: Two times a day (BID) | ORAL | 3 refills | Status: DC

## 2019-11-03 NOTE — Telephone Encounter (Signed)
Patient was seen today. She was calling in to make sure the doctor she is going to be referred to will also be checking her bronchioles because she states she get bronchitis a lot. Please advise.

## 2019-11-03 NOTE — Telephone Encounter (Signed)
This referral was placed as discussed at our visit

## 2019-11-03 NOTE — Progress Notes (Signed)
Virtual Visit via Video   I connected with patient on 11/03/19 at  3:30 PM EDT by a video enabled telemedicine application and verified that I am speaking with the correct person using two identifiers.  Location patient: Home Location provider: Acup[facility]urist, Office Persons participating in the virtual visit: Patient, Provider, [place] (Jess B)  I discussed the limitations of evaluation and management by telemedicine and the availability of in person appointments. The patient expressed understanding and agreed to proceed.  Subjective:   HPI:   GERD- pt reports sxs are worse than previous despite Omeprazole 40mg  daily.  Sxs are particularly bad at night.  Pt reports she has 'constant mucous' in her throat and was told by ENT that this could be due to the worsening reflux.    'I keep swallowing something' and it's making nauseous.  Currently on Loratadine 10mg  daily and Nasonex.  Neuropathy- pt wants to change to 300mg  tab rather than 3 of the 100mg  tabs.  'chronic bronchitis'- pt reports she has chronic bronchitis b/c she has had bronchitis on previous occasions.  Reports that when she has coughing episodes now, her inhaler provides little relief  ROS:   See pertinent positives and negatives per HPI.  Patient Active Problem List   Diagnosis Date Noted  . Allergic rhinitis 08/19/2018  . Type 2 diabetes mellitus with diabetic neuropathy, unspecified ([place]) 11/05/2017  . Encounter for long-term use of muscle relaxants 09/24/2017  . CAD in native artery 09/24/2017  . OSA (obstructive sleep apnea) 09/24/2017  . Snorings 09/24/2017  . Morbid obesity ([place]) 06/02/2017  . Depression 06/02/2017  . Iron deficiency anemia 09/23/2016  . Anemia of chronic disease 09/28/2015  . Genetic testing 08/21/2015  . History of left breast cancer 07/13/2015  . History of right breast cancer 06/20/2015  . Hearing loss due to cerumen impaction 12/29/2014  . Allergy to adhesive tape 05/19/2014  .  Hyperlipidemia 12/06/2013  . GERD (gastroesophageal reflux disease) 12/06/2013  . Cervical disc disease 11/11/2013  . Osteopenia 05/25/2013  . Allergic asthma 12/24/2012  . Insomnia 04/02/2012  . Physical exam, annual 04/02/2012  . HTN (hypertension) 02/03/2012  . Vertigo, benign positional 02/03/2012  . Left groin pain 02/03/2012    Social History   Tobacco Use  . Smoking status: Former Smoker    Packs/day: 1.00    Years: 20.00    Pack years: 20.00    Types: Cigarettes    Quit date: 07/30/2011    Years since quitting: 8.2  . Smokeless tobacco: Never Used  . Tobacco comment: Quit >4 years ago; 1 ppd for about 5/20 years (remaining was less)  Substance Use Topics  . Alcohol use: No    Alcohol/week: 0.0 standard drinks    Current Outpatient Medications:  .  acetaminophen-codeine (TYLENOL #3) 300-30 MG tablet, Take by mouth every 4 (four) hours as needed for moderate pain., Disp: , Rfl:  .  albuterol (PROAIR HFA) 108 (90 Base) MCG/ACT inhaler, Inhale 2 puffs into the lungs every 4 (four) hours as needed for wheezing., Disp: 1 Inhaler, Rfl: 6 .  Ascorbic Acid (VITAMIN C PO), Take by mouth., Disp: , Rfl:  .  aspirin 81 MG tablet, Take 81 mg by mouth daily., Disp: , Rfl:  .  atorvastatin (LIPITOR) 10 MG tablet, TAKE 1 TABLET BY MOUTH  DAILY, Disp: 90 tablet, Rfl: 3 .  ferrous sulfate 325 (65 FE) MG tablet, Take 1 tablet (325 mg total) by mouth daily with breakfast., Disp: 30 tablet, Rfl:  6 .  gabapentin (NEURONTIN) 100 MG capsule, TAKE 2 CAPSULES BY MOUTH 3  TIMES DAILY, Disp: 540 capsule, Rfl: 3 .  loratadine (CLARITIN) 10 MG tablet, Take 10 mg by mouth daily., Disp: , Rfl:  .  losartan (COZAAR) 100 MG tablet, TAKE 1 TABLET BY MOUTH  DAILY, Disp: 90 tablet, Rfl: 3 .  meclizine (ANTIVERT) 25 MG tablet, TAKE 1 TABLET(25 MG) BY MOUTH THREE TIMES DAILY AS NEEDED FOR DIZZINESS, Disp: 30 tablet, Rfl: 3 .  MELATONIN PO, Take by mouth., Disp: , Rfl:  .  metFORMIN (GLUCOPHAGE) 500 MG tablet,  Take 1 tablet (500 mg total) by mouth 2 (two) times daily with a meal., Disp: 180 tablet, Rfl: 0 .  mometasone (NASONEX) 50 MCG/ACT nasal spray, Place 2 sprays into the nose daily., Disp: 17 g, Rfl: 12 .  Multiple Vitamins-Minerals (ZINC PO), Take by mouth., Disp: , Rfl:  .  omeprazole (PRILOSEC) 40 MG capsule, TAKE 1 CAPSULE BY MOUTH  TWICE DAILY, Disp: 180 capsule, Rfl: 3 .  sertraline (ZOLOFT) 50 MG tablet, TAKE 1 TABLET(50 MG) BY MOUTH DAILY, Disp: 90 tablet, Rfl: 0 .  tiZANidine (ZANAFLEX) 4 MG tablet, Take 1 tablet (4 mg total) by mouth at bedtime., Disp: 30 tablet, Rfl: 3 .  traZODone (DESYREL) 100 MG tablet, TAKE 1 TABLET BY MOUTH AT  BEDTIME, Disp: 90 tablet, Rfl: 1  Allergies  Allergen Reactions  . Bacitracin-Neomycin-Polymyxin  [Neomycin-Bacitracin Zn-Polymyx] Swelling  . Nsaids Other (See Comments)    nosebleeds  . Tape Hives    Pt cannot tolerate bandaids, tape, or any other adhesives.   . Ambien [Zolpidem Tartrate]     Hives   . Amoxicillin     Rash   . Contrast Media [Iodinated Diagnostic Agents] Hives    Pt states hives with prior ct, was given benadryl to resolve  . Latex Swelling  . Prednisone     Objective:   BP 117/61   Pulse 82   Temp (!) 97.3 F (36.3 C) (Oral)   Ht 5' (1.524 m)   Wt 197 lb (89.4 kg)   BMI 38.47 kg/m   AAOx3, NAD NCAT, EOMI No obvious CN deficits Coloring WNL Pt is able to speak clearly, coherently without shortness of breath or increased work of breathing.  Thought process is linear.  Mood is appropriate.   Assessment and Plan:   Allergic rhinitis- seen on CT scan done by ENT. Continue Claritin and Nasonex daily  GERD- deteriorated.  D/c Omeprazole and start BID protonix.  Discussed dietary and lifestyle modifications.  Also discussed that her cough may be GERD and PND related and if that is the case, it won't respond to her albuterol inhaler.  Will follow.  Neuropathy- pt wants to switch gabapentin to 300mg  TID.  New  prescription sent  'chronic bronchitis'- pt has never seen pulm and gave herself this dx after previous URI/bronchitis dx.  Reports her inhaler is not currently working for her cough and she is concerned the situation is worsening.  Told her that her current cough could be caused by other things- such as GERD or PND.  She is still concerned about chronic bronchitis/COPD so I will refer to pulmonary for complete evaluation and tx prn.  Pt expressed understanding and is in agreement w/ plan.    [name], MD 11/03/2019

## 2019-11-03 NOTE — Progress Notes (Signed)
I have discussed the procedure for the virtual visit with the patient who has given consent to proceed with assessment and treatment.   Weslyn Holsonback L Aniela Caniglia, CMA     

## 2019-11-04 ENCOUNTER — Telehealth: Payer: Self-pay | Admitting: Family Medicine

## 2019-11-04 NOTE — Progress Notes (Signed)
  Chronic Care Management   Note  11/04/2019 Name: Tracy [name] MRN: [id number] DOB: 30-May-1946  Tracy [name] is a 73 y.o. year old female who is a primary care patient of Birdie Riddle, Aundra Millet, MD. I reached out to [place] by phone today in response to a referral sent by [name]'s PCP, [name], MD.   [name] was given information about Chronic Care Management services today including:  1. CCM service includes personalized support from designated clinical staff supervised by her physician, including individualized plan of care and coordination with other care providers 2. 24/7 contact phone numbers for assistance for urgent and routine care needs. 3. Service will only be billed when office clinical staff spend 20 minutes or more in a month to coordinate care. 4. Only one practitioner may furnish and bill the service in a calendar month. 5. The patient may stop CCM services at any time (effective at the end of the month) by phone call to the office staff.   Patient agreed to services and verbal consent obtained.   Follow up plan:   Earney Hamburg [facility] Scheduler

## 2019-11-04 NOTE — Telephone Encounter (Signed)
Pt made aware and stated an understanding.

## 2019-11-04 NOTE — Progress Notes (Signed)
°  Chronic Care Management   Outreach Note  11/04/2019 Name: Tracy [name] MRN: [id number] DOB: [birth year]  Referred by: [name], MD Reason for referral : No chief complaint on file.   An unsuccessful telephone outreach was attempted today. The patient was referred to the pharmacist for assistance with care management and care coordination.   Follow Up Plan:   Earney Hamburg Upstream Scheduler

## 2019-11-11 ENCOUNTER — Other Ambulatory Visit: Payer: Self-pay | Admitting: General Practice

## 2019-11-11 DIAGNOSIS — E785 Hyperlipidemia, unspecified: Secondary | ICD-10-CM

## 2019-11-11 DIAGNOSIS — E1140 Type 2 diabetes mellitus with diabetic neuropathy, unspecified: Secondary | ICD-10-CM

## 2019-11-11 DIAGNOSIS — I10 Essential (primary) hypertension: Secondary | ICD-10-CM

## 2019-11-22 ENCOUNTER — Other Ambulatory Visit: Payer: Self-pay

## 2019-11-22 MED ORDER — METFORMIN HCL 500 MG PO TABS: 500.0000 mg | ORAL_TABLET | Freq: Two times a day (BID) | ORAL | 0 refills | Status: DC

## 2019-11-24 ENCOUNTER — Other Ambulatory Visit: Payer: Self-pay

## 2019-11-24 ENCOUNTER — Ambulatory Visit: Payer: Medicare Other

## 2019-11-24 ENCOUNTER — Telehealth: Payer: Self-pay

## 2019-11-24 DIAGNOSIS — E1140 Type 2 diabetes mellitus with diabetic neuropathy, unspecified: Secondary | ICD-10-CM

## 2019-11-24 DIAGNOSIS — K219 Gastro-esophageal reflux disease without esophagitis: Secondary | ICD-10-CM

## 2019-11-24 DIAGNOSIS — I10 Essential (primary) hypertension: Secondary | ICD-10-CM

## 2019-11-24 DIAGNOSIS — E785 Hyperlipidemia, unspecified: Secondary | ICD-10-CM

## 2019-11-24 NOTE — Patient Instructions (Addendum)
Visit Information  Goals Addressed            This Visit's Progress   . A1c <7%       CARE PLAN ENTRY (see longitudinal plan of care for additional care plan information)  Current Barriers:  . Current diabetes regimen metformin 500 mg twice daily Lab Results  Component Value Date   HGBA1C 8.4 (H) 08/20/2019 .   Lab Results  Component Value Date   CREATININE 1.07 09/13/2019   CREATININE 1.00 08/20/2019   CREATININE 1.05 05/20/2019 .   Lab Results  Component Value Date   EGFR 71 (L) 07/13/2015 .  Cardiovascular risk reduction: o Current hypertensive regimen: losartan 100 mg daily o Current hyperlipidemia regimen: atorvastatin 10 mg daily  o Current antiplatelet regimen: aspirin 81 mg   Pharmacist Clinical Goal(s):  Marland Kitchen Over the next 90 days, patient will work with PharmD and primary care provider to address any needed medication adjustments  Interventions: . Comprehensive medication review performed, medication list updated in electronic medical record . Inter-disciplinary care team collaboration (see longitudinal plan of care)  Patient Self Care Activities:  . Patient will take medications as prescribed . Patient will report any questions or concerns to provider   Initial goal documentation     . BP <140/90       CARE PLAN ENTRY (see longitudinal plan of care for additional care plan information)  Current Barriers:  . Current antihypertensive regimen: losartan 100 mg daily . Last practice recorded BP readings:  BP Readings from Last 3 Encounters:  11/03/19 117/61  08/20/19 140/79  07/15/19 (!) 145/72 .  Patient home BP readings are ranging: 630-160F systolic . Most recent eGFR/CrCl:  Lab Results  Component Value Date   EGFR 71 (L) 07/13/2015   Pharmacist Clinical Goal(s):  Marland Kitchen Over the next 90 days, patient will work with PharmD and providers to optimize antihypertensive regimen  Interventions: . Inter-disciplinary care team collaboration (see  longitudinal plan of care) . Comprehensive medication review performed; medication list updated in the electronic medical record.   Patient Self Care Activities:  . Patient will continue to check BP at home at least once weekly and using a cuff for the upper arm rather than wrist. Document, and provide at future appointments . Patient will focus on medication adherence by continuing with current medication management.   Initial goal documentation     . GERD: Minimize recurring symptoms       CARE PLAN ENTRY Current Barriers:  . Chronic Disease Management support, education, and care coordination needs related to  GERD/Acid Reflux o Current regimen: pantoprazole 40 mg daily  Pharmacist Clinical Goal(s):  Marland Kitchen Over next 90 days: Minimize recurring symptoms of acid reflux  Interventions: . Comprehensive medication review performed.  Patient Self Care Activities:  . Patient verbalizes understanding of plan to call with any questions. . Continue pantoprazole 40 mg daily  Initial goal documentation     . LDL Cholesterol <70       CARE PLAN ENTRY (see longitudinal plan of care for additional care plan information)  Current Barriers:  . Current antihyperlipidemic regimen: atorvastatin 10 mg daily     Component Value Date/Time   CHOL 147 08/20/2019 1044   TRIG 141.0 08/20/2019 1044   HDL 53.70 08/20/2019 1044   CHOLHDL 3 08/20/2019 1044   VLDL 28.2 08/20/2019 1044   LDLCALC 65 08/20/2019 1044   LDLDIRECT 154.7 04/05/2013 0906 .   Marland Kitchen ASCVD risk enhancing conditions: age >21, DM, HTN,  CKD, CHF, current smoker . 10-year ASCVD risk score: 22.7%  Pharmacist Clinical Goal(s):  Marland Kitchen Over the next 90 days, patient will work with PharmD and providers towards optimized antihyperlipidemic therapy  Interventions: . Comprehensive medication review performed; medication list updated in electronic medical record.  Bertram Savin care team collaboration (see longitudinal plan of  care)  Patient Self Care Activities:  . Patient will focus on medication adherence by continuing current medication management  Initial goal documentation        Ms. Vannostrand was given information about Chronic Care Management services today including:  1. CCM service includes personalized support from designated clinical staff supervised by her physician, including individualized plan of care and coordination with other care providers 2. 24/7 contact phone numbers for assistance for urgent and routine care needs. 3. Standard insurance, coinsurance, copays and deductibles apply for chronic care management only during months in which we provide at least 20 minutes of these services. Most insurances cover these services at 100%, however patients may be responsible for any copay, coinsurance and/or deductible if applicable. This service may help you avoid the need for more expensive face-to-face services. 4. Only one practitioner may furnish and bill the service in a calendar month. 5. The patient may stop CCM services at any time (effective at the end of the month) by phone call to the office staff.  Patient agreed to services and verbal consent obtained.   The patient verbalized understanding of instructions provided today and agreed to receive a mailed copy of patient instruction and/or educational materials. Telephone follow up appointment with pharmacy team member scheduled for: See next appointment with "Care Management Staff" under "What's Next" below.   Thank you!  [name], Pharm.D. Clinical Pharmacist [facility] [phone] Diabetes Mellitus and Nutrition, Adult When you have diabetes (diabetes mellitus), it is very important to have healthy eating habits because your blood sugar (glucose) levels are greatly affected by what you eat and drink. Eating healthy foods in the appropriate amounts, at about the same times every day, can help  you:  Control your blood glucose.  Lower your risk of heart disease.  Improve your blood pressure.  Reach or maintain a healthy weight. Every person with diabetes is different, and each person has different needs for a meal plan. Your health care provider may recommend that you work with a diet and nutrition specialist (dietitian) to make a meal plan that is best for you. Your meal plan may vary depending on factors such as:  The calories you need.  The medicines you take.  Your weight.  Your blood glucose, blood pressure, and cholesterol levels.  Your activity level.  Other health conditions you have, such as heart or kidney disease. How do carbohydrates affect me? Carbohydrates, also called carbs, affect your blood glucose level more than any other type of food. Eating carbs naturally raises the amount of glucose in your blood. Carb counting is a method for keeping track of how many carbs you eat. Counting carbs is important to keep your blood glucose at a healthy level, especially if you use insulin or take certain oral diabetes medicines. It is important to know how many carbs you can safely have in each meal. This is different for every person. Your dietitian can help you calculate how many carbs you should have at each meal and for each snack. Foods that contain carbs include:  Bread, cereal, rice, pasta, and crackers.  Potatoes and corn.  Peas, beans, and lentils.  Milk and yogurt.  Fruit and juice.  Desserts, such as cakes, cookies, ice cream, and candy. How does alcohol affect me? Alcohol can cause a sudden decrease in blood glucose (hypoglycemia), especially if you use insulin or take certain oral diabetes medicines. Hypoglycemia can be a life-threatening condition. Symptoms of hypoglycemia (sleepiness, dizziness, and confusion) are similar to symptoms of having too much alcohol. If your health care provider says that alcohol is safe for you, follow these  guidelines:  Limit alcohol intake to no more than 1 drink per day for nonpregnant women and 2 drinks per day for men. One drink equals 12 oz of beer, 5 oz of wine, or 1 oz of hard liquor.  Do not drink on an empty stomach.  Keep yourself hydrated with water, diet soda, or unsweetened iced tea.  Keep in mind that regular soda, juice, and other mixers may contain a lot of sugar and must be counted as carbs. What are tips for following this plan?  Reading food labels  Start by checking the serving size on the "Nutrition Facts" label of packaged foods and drinks. The amount of calories, carbs, fats, and other nutrients listed on the label is based on one serving of the item. Many items contain more than one serving per package.  Check the total grams (g) of carbs in one serving. You can calculate the number of servings of carbs in one serving by dividing the total carbs by 15. For example, if a food has 30 g of total carbs, it would be equal to 2 servings of carbs.  Check the number of grams (g) of saturated and trans fats in one serving. Choose foods that have low or no amount of these fats.  Check the number of milligrams (mg) of salt (sodium) in one serving. Most people should limit total sodium intake to less than 2,300 mg per day.  Always check the nutrition information of foods labeled as "low-fat" or "nonfat". These foods may be higher in added sugar or refined carbs and should be avoided.  Talk to your dietitian to identify your daily goals for nutrients listed on the label. Shopping  Avoid buying canned, premade, or processed foods. These foods tend to be high in fat, sodium, and added sugar.  Shop around the outside edge of the grocery store. This includes fresh fruits and vegetables, bulk grains, fresh meats, and fresh dairy. Cooking  Use low-heat cooking methods, such as baking, instead of high-heat cooking methods like deep frying.  Cook using healthy oils, such as olive,  canola, or sunflower oil.  Avoid cooking with butter, cream, or high-fat meats. Meal planning  Eat meals and snacks regularly, preferably at the same times every day. Avoid going long periods of time without eating.  Eat foods high in fiber, such as fresh fruits, vegetables, beans, and whole grains. Talk to your dietitian about how many servings of carbs you can eat at each meal.  Eat 4-6 ounces (oz) of lean protein each day, such as lean meat, chicken, fish, eggs, or tofu. One oz of lean protein is equal to: ? 1 oz of meat, chicken, or fish. ? 1 egg. ?  cup of tofu.  Eat some foods each day that contain healthy fats, such as avocado, nuts, seeds, and fish. Lifestyle  Check your blood glucose regularly.  Exercise regularly as told by your health care provider. This may include: ? 150 minutes of moderate-intensity or vigorous-intensity exercise each week.  This could be brisk walking, biking, or water aerobics. ? Stretching and doing strength exercises, such as yoga or weightlifting, at least 2 times a week.  Take medicines as told by your health care provider.  Do not use any products that contain nicotine or tobacco, such as cigarettes and e-cigarettes. If you need help quitting, ask your health care provider.  Work with a Social worker or diabetes educator to identify strategies to manage stress and any emotional and social challenges. Questions to ask a health care provider  Do I need to meet with a diabetes educator?  Do I need to meet with a dietitian?  What number can I call if I have questions?  When are the best times to check my blood glucose? Where to find more information:  American Diabetes Association: diabetes.org  Academy of Nutrition and Dietetics: www.eatright.[employer] of Diabetes and Digestive and Kidney Diseases (NIH): [employer] Summary  A healthy meal plan will help you control your blood glucose and maintain a healthy  lifestyle.  Working with a diet and nutrition specialist (dietitian) can help you make a meal plan that is best for you.  Keep in mind that carbohydrates (carbs) and alcohol have immediate effects on your blood glucose levels. It is important to count carbs and to use alcohol carefully. This information is not intended to replace advice given to you by your health care provider. Make sure you discuss any questions you have with your health care provider. Document Revised: 06/27/2017 Document Reviewed: 08/19/2016 Elsevier Patient Education  2020 [employer].

## 2019-11-24 NOTE — Telephone Encounter (Signed)
if patient returns call please have her call pharmacist [phone]

## 2019-11-24 NOTE — Progress Notes (Signed)
Chronic Care Management Pharmacy  Name: Tracy [name]  MRN: [id number] DOB: [birth year]  Chief Complaint/ HPI  Tracy [name],  74 y.o. , female presents for their Initial CCM visit with the clinical pharmacist via telephone due to COVID-19 Pandemic.  PCP : [name], MD  Their chronic conditions include: Anxiety, HLD, HTN, MI, TIA, RLS.  Office Visits:  4/7 (PCP): ongoing reflux symptoms, omeprazole 40 mg switched to pantoprazole 40 mg   1/22 (PCP): A1c increased to 8.4 from 6.6 in 10 months, started on metformin 500 mg twice daily. Hgb lower to 9.3, encouraged to take iron supplement.   Consult Visit:  5/5: upcoming pulmonology appointment with pulmonary    Outpatient Encounter Medications as of 11/24/2019  Medication Sig  . acetaminophen-codeine (TYLENOL #3) 300-30 MG tablet Take by mouth every 4 (four) hours as needed for moderate pain.  Marland Kitchen albuterol (PROAIR HFA) 108 (90 Base) MCG/ACT inhaler Inhale 2 puffs into the lungs every 4 (four) hours as needed for wheezing.  . Ascorbic Acid (VITAMIN C PO) Take by mouth.  Marland Kitchen aspirin 81 MG tablet Take 81 mg by mouth daily.  Marland Kitchen atorvastatin (LIPITOR) 10 MG tablet TAKE 1 TABLET BY MOUTH  DAILY  . ferrous sulfate 325 (65 FE) MG tablet Take 1 tablet (325 mg total) by mouth daily with breakfast.  . gabapentin (NEURONTIN) 300 MG capsule Take 1 capsule (300 mg total) by mouth 3 (three) times daily.  Marland Kitchen loratadine (CLARITIN) 10 MG tablet Take 10 mg by mouth daily.  Marland Kitchen losartan (COZAAR) 100 MG tablet TAKE 1 TABLET BY MOUTH  DAILY  . meclizine (ANTIVERT) 25 MG tablet TAKE 1 TABLET(25 MG) BY MOUTH THREE TIMES DAILY AS NEEDED FOR DIZZINESS  . MELATONIN PO Take by mouth.  . metFORMIN (GLUCOPHAGE) 500 MG tablet Take 1 tablet (500 mg total) by mouth 2 (two) times daily with a meal.  . mometasone (NASONEX) 50 MCG/ACT nasal spray Place 2 sprays into the nose daily.  . Multiple Vitamins-Minerals (ZINC PO) Take by mouth.  . pantoprazole  (PROTONIX) 40 MG tablet Take 1 tablet (40 mg total) by mouth 2 (two) times daily.  . sertraline (ZOLOFT) 50 MG tablet TAKE 1 TABLET(50 MG) BY MOUTH DAILY  . tiZANidine (ZANAFLEX) 4 MG tablet Take 1 tablet (4 mg total) by mouth at bedtime.  . traZODone (DESYREL) 100 MG tablet TAKE 1 TABLET BY MOUTH AT  BEDTIME   No facility-administered encounter medications on file as of 11/24/2019.   Current Diagnosis/Assessment: Goals Addressed   None    Hypertension   BP today is:  <140/90  Office blood pressures are  BP Readings from Last 3 Encounters:  11/03/19 117/61  08/20/19 140/79  07/15/19 (!) 145/72   Patient is currently controlled on the following medications: losartan 100 mg once daily   Patient checks BP at home weekly Patient home BP readings are ranging: 740-814G systolic We discussed monitoring at home at least once weekly and using a cuff for the upper arm rather than wrist.   Plan Continue current medications.    Diabetes   Recent Relevant Labs: Lab Results  Component Value Date/Time   HGBA1C 8.4 (H) 08/20/2019 10:44 AM   HGBA1C 7.3 (H) 05/20/2019 02:22 PM   EGFR 71 (L) 07/13/2015 03:16 PM    Patient has failed these meds in past: n/a.  According to January 2021 lab, is uncontrolled. Following that OV has been on metformin 500 mg twice daily. Patient is waiting to get  her car back from [place] then will schedule a1c follow-up. We discussed concerns related to metformin product recalls and side effects and diabetes as it relates to heart and kidney health. We also reviewed diet recommendations.  Plan Continue current medications and control with diet and exercise   Hyperlipidemia   Lipid Panel     Component Value Date/Time   CHOL 147 08/20/2019 1044   TRIG 141.0 08/20/2019 1044   HDL 53.70 08/20/2019 1044   CHOLHDL 3 08/20/2019 1044   VLDL 28.2 08/20/2019 1044   LDLCALC 65 08/20/2019 1044   LDLDIRECT 154.7 04/05/2013 0906     The 10-year ASCVD risk score  Mikey Bussing DC Jr., et al., 2013) is: 22.7%   Values used to calculate the score:     Age: 26 years     Sex: Female     Is Non-Hispanic African American: Yes     Diabetic: Yes     Tobacco smoker: No     Systolic Blood Pressure: 161 mmHg     Is BP treated: Yes     HDL Cholesterol: 53.7 mg/dL     Total Cholesterol: 147 mg/dL   Patient is currently controlled on the following medications: atorvastatin 10 mg daily. Denies any muscle or abdominal pain or n/v. Also taking aspirin 81 mg daily.   Plan Continue current medications  Anxiety   Expresses wanting to taper off of sertraline 50 mg due to lack of benefit. Recommendation reviewed with Dr. Birdie Riddle and taper regimen provided. Patient agrees with plan and verbalizes understanding.    Plan Take half tablet (25 mg) daily for 2 weeks then stop taking sertraline.    GERD   Acid reflux is ongoing issue for patient. Has recently switched from omeprazole 40 mg to pantoprazole 40 mg. Still experiencing symptoms of reflux/cough and will be seeing pulm 5/5 to have this assessed.   Plan  Continue current medication.  Vaccines   Reviewed vaccination history. Due for Shingrix.   Immunization History  Administered Date(s) Administered  . Influenza Split 04/28/2010, 06/24/2011  . Influenza Whole 04/22/2013  . Influenza, High Dose Seasonal PF 05/30/2016, 05/20/2018, 04/09/2019  . Influenza,inj,Quad PF,6+ Mos 04/29/2015, 05/16/2017  . Influenza-Unspecified 05/03/2014  . Pneumococcal Conjugate-13 06/01/2015  . Pneumococcal Polysaccharide-23 05/10/2014  . Td 04/05/2013  . Zoster 05/01/2010   Plan Recommended patient receive Shingrix in office/pharmacy.   ______________ Visit Information SDOH (Social Determinants of Health) assessments performed:  Yes.   Food Insecurity: No Food Insecurity  . Worried About Charity fundraiser in the Last Year: Never true  . Ran Out of Food in the Last Year: Never true     Transportation Needs: No  Transportation Needs  . Lack of Transportation (Medical): No  . Lack of Transportation (Non-Medical): No    Ms. Stalker was given information about Chronic Care Management services today including:  1. CCM service includes personalized support from designated clinical staff supervised by her physician, including individualized plan of care and coordination with other care providers 2. 24/7 contact phone numbers for assistance for urgent and routine care needs. 3. Standard insurance, coinsurance, copays and deductibles apply for chronic care management only during months in which we provide at least 20 minutes of these services. Most insurances cover these services at 100%, however patients may be responsible for any copay, coinsurance and/or deductible if applicable. This service may help you avoid the need for more expensive face-to-face services. 4. Only one practitioner may furnish and bill the service in  a calendar month. 5. The patient may stop CCM services at any time (effective at the end of the month) by phone call to the office staff.  Patient agreed to services and verbal consent obtained.   [name], Pharm.D. Clinical Pharmacist [facility] at [facility] [phone]

## 2019-11-26 ENCOUNTER — Other Ambulatory Visit: Payer: Self-pay | Admitting: General Practice

## 2019-11-26 MED ORDER — TIZANIDINE HCL 4 MG PO TABS: 4.0000 mg | ORAL_TABLET | Freq: Every day | ORAL | 3 refills | Status: DC

## 2019-12-01 ENCOUNTER — Institutional Professional Consult (permissible substitution): Payer: Medicare Other | Admitting: Pulmonary Disease

## 2019-12-02 ENCOUNTER — Telehealth: Payer: Self-pay | Admitting: Family Medicine

## 2019-12-02 NOTE — Telephone Encounter (Signed)
Please advise 

## 2019-12-02 NOTE — Telephone Encounter (Signed)
Called and advised pt of PCP notes . Stated an understanding.

## 2019-12-02 NOTE — Telephone Encounter (Signed)
Patient missed appt with Pulmonary yesterday due to getting lost.    Patient was scheduled for 6/3.    She would like to know if there is another pulmonologist who may be able to get her in sooner.  Patient would not like to go as far as [place].

## 2019-12-02 NOTE — Telephone Encounter (Signed)
We only have 1 pulmonary group in town so waiting until 6/3 isn't bad

## 2019-12-06 DIAGNOSIS — H40013 Open angle with borderline findings, low risk, bilateral: Secondary | ICD-10-CM | POA: Diagnosis not present

## 2019-12-06 DIAGNOSIS — H2513 Age-related nuclear cataract, bilateral: Secondary | ICD-10-CM | POA: Diagnosis not present

## 2019-12-06 DIAGNOSIS — H18832 Recurrent erosion of cornea, left eye: Secondary | ICD-10-CM | POA: Diagnosis not present

## 2019-12-06 DIAGNOSIS — Z83511 Family history of glaucoma: Secondary | ICD-10-CM | POA: Diagnosis not present

## 2019-12-08 ENCOUNTER — Other Ambulatory Visit: Payer: Self-pay | Admitting: Family Medicine

## 2019-12-10 ENCOUNTER — Ambulatory Visit: Payer: Medicare Other | Admitting: Pulmonary Disease

## 2019-12-18 NOTE — Assessment & Plan Note (Signed)
Chronic problem.  Attempting to control w/ diet and exercise but admits to doing poorly on both.  Due for eye exam- pt to schedule.  Known neuropathy.  Check labs.  Start meds prn

## 2019-12-20 DIAGNOSIS — H40013 Open angle with borderline findings, low risk, bilateral: Secondary | ICD-10-CM | POA: Diagnosis not present

## 2019-12-20 DIAGNOSIS — H2513 Age-related nuclear cataract, bilateral: Secondary | ICD-10-CM | POA: Diagnosis not present

## 2019-12-20 DIAGNOSIS — Z83511 Family history of glaucoma: Secondary | ICD-10-CM | POA: Diagnosis not present

## 2019-12-20 DIAGNOSIS — H18832 Recurrent erosion of cornea, left eye: Secondary | ICD-10-CM | POA: Diagnosis not present

## 2019-12-23 ENCOUNTER — Telehealth: Payer: Medicare Other

## 2019-12-24 ENCOUNTER — Telehealth: Payer: Self-pay

## 2019-12-24 ENCOUNTER — Other Ambulatory Visit: Payer: Self-pay

## 2019-12-24 ENCOUNTER — Ambulatory Visit: Payer: Medicare Other

## 2019-12-24 DIAGNOSIS — F32A Depression, unspecified: Secondary | ICD-10-CM

## 2019-12-24 DIAGNOSIS — E1140 Type 2 diabetes mellitus with diabetic neuropathy, unspecified: Secondary | ICD-10-CM

## 2019-12-24 NOTE — Telephone Encounter (Signed)
  Chronic Care Management   Outreach Note  12/24/2019 Name: Tracy [name] MRN: [id number] DOB: 09-20-1945  Referred by: [name], MD Reason for referral: Telephone Appointment with [facility] Pharmacist, [name].   An unsuccessful telephone outreach was attempted today. The patient was referred to the pharmacist for assistance with care management and care coordination.    Telephone appointment with clinical pharmacist today at 1230pm. If patient returns call immediately please transfer to ext [phone], otherwise please provide number below to reschedule visit.   [name], Pharm.D., BCGP Clinical Pharmacist [facility] at [facility] [phone]

## 2019-12-24 NOTE — Progress Notes (Signed)
Chronic Care Management Pharmacy  Name: Tracy [name]  MRN: [id number] DOB: [birth year]  Chief Complaint/ HPI  Tracy [name],  74 y.o. , female presents for their Initial CCM visit with the clinical pharmacist via telephone due to COVID-19 Pandemic.  PCP : Tracy Minium, MD  Their chronic conditions include: Anxiety, HLD, HTN, MI, TIA, RLS.  Office Visits: 11/24/2019 [facility]): started sertraline taper, counseled on metformin and recommended f/u on DM labs. 11/03/2019 (PCP): ongoing reflux symptoms, omeprazole 40 mg switched to pantoprazole 40 mg  08/20/2019 (PCP): A1c increased to 8.4 from 6.6 in 10 months, started on metformin 500 mg twice daily. Hgb lower to 9.3, encouraged to take iron supplement.   Consult Visit: 6/3: upcoming pulmonology appointment with pulmonary   Encounter Diagnoses  Name Primary?  . Type 2 diabetes mellitus with diabetic neuropathy, without long-term current use of insulin ([place]) Yes  . Depression, unspecified depression type    Patient Active Problem List   Diagnosis Date Noted  . Allergic rhinitis 08/19/2018  . Type 2 diabetes mellitus with diabetic neuropathy, unspecified ([place]) 11/05/2017  . Encounter for long-term use of muscle relaxants 09/24/2017  . CAD in native artery 09/24/2017  . OSA (obstructive sleep apnea) 09/24/2017  . Snorings 09/24/2017  . Morbid obesity ([place]) 06/02/2017  . Depression 06/02/2017  . Iron deficiency anemia 09/23/2016  . Anemia of chronic disease 09/28/2015  . Genetic testing 08/21/2015  . History of left breast cancer 07/13/2015  . History of right breast cancer 06/20/2015  . Hearing loss due to cerumen impaction 12/29/2014  . Allergy to adhesive tape 05/19/2014  . Hyperlipidemia 12/06/2013  . GERD (gastroesophageal reflux disease) 12/06/2013  . Cervical disc disease 11/11/2013  . Osteopenia 05/25/2013  . Allergic asthma 12/24/2012  . Insomnia 04/02/2012  . Physical exam, annual 04/02/2012  . HTN  (hypertension) 02/03/2012  . Vertigo, benign positional 02/03/2012  . Left groin pain 02/03/2012   Social History   Socioeconomic History  . Marital status: Divorced    Spouse name: Not on file  . Number of children: 7  . Years of education: Not on file  . Highest education level: Not on file  Occupational History  . Occupation: retired  Tobacco Use  . Smoking status: Former Smoker    Packs/day: 1.00    Years: 20.00    Pack years: 20.00    Types: Cigarettes    Quit date: 07/30/2011    Years since quitting: 8.4  . Smokeless tobacco: Never Used  . Tobacco comment: Quit >4 years ago; 1 ppd for about 5/20 years (remaining was less)  Substance and Sexual Activity  . Alcohol use: No    Alcohol/week: 0.0 standard drinks  . Drug use: No  . Sexual activity: Not on file  Other Topics Concern  . Not on file  Social History Narrative   Lives alone.  Retired.  Education:  11th grade GED.  Children:  7 (one here).    Social Determinants of Health   Financial Resource Strain:   . Difficulty of Paying Living Expenses:   Food Insecurity: No Food Insecurity  . Worried About Charity fundraiser in the Last Year: Never true  . Ran Out of Food in the Last Year: Never true  Transportation Needs: No Transportation Needs  . Lack of Transportation (Medical): No  . Lack of Transportation (Non-Medical): No  Physical Activity:   . Days of Exercise per Week:   . Minutes of Exercise per Session:  Stress:   . Feeling of Stress :   Social Connections:   . Frequency of Communication with Friends and Family:   . Frequency of Social Gatherings with Friends and Family:   . Attends Religious Services:   . Active Member of Clubs or Organizations:   . Attends Archivist Meetings:   Marland Kitchen Marital Status:    Family History  Problem Relation Age of Onset  . Diabetes Father   . Lung cancer Sister        dx. <50; former smoker  . Diabetes Brother   . Diabetes Paternal Aunt   . Stroke  Maternal Grandmother   . Diabetes Paternal Grandmother   . Emphysema Mother 48       smoker  . Diabetes Brother   . Brain cancer Brother 64       unknown tumor type  . Cancer Daughter 25       neck cancer  . Other Daughter        hysterectomy for unspecified reason  . Breast cancer Cousin   . Cancer Cousin        unspecified type  . Breast cancer Other        triple negative breast cancer in her 65s  . Cancer Daughter   . Colon polyps Neg Hx   . Esophageal cancer Neg Hx   . Gallbladder disease Neg Hx    Allergies  Allergen Reactions  . Bacitracin-Neomycin-Polymyxin  [Neomycin-Bacitracin Zn-Polymyx] Swelling  . Nsaids Other (See Comments)    nosebleeds  . Tape Hives    Pt cannot tolerate bandaids, tape, or any other adhesives.   . Ambien [Zolpidem Tartrate]     Hives   . Amoxicillin     Rash   . Contrast Media [Iodinated Diagnostic Agents] Hives    Pt states hives with prior ct, was given benadryl to resolve  . Latex Swelling  . Prednisone    Outpatient Encounter Medications as of 12/24/2019  Medication Sig  . acetaminophen-codeine (TYLENOL #3) 300-30 MG tablet Take by mouth every 4 (four) hours as needed for moderate pain.  Marland Kitchen albuterol (PROAIR HFA) 108 (90 Base) MCG/ACT inhaler Inhale 2 puffs into the lungs every 4 (four) hours as needed for wheezing.  . Ascorbic Acid (VITAMIN C PO) Take by mouth.  Marland Kitchen aspirin 81 MG tablet Take 81 mg by mouth daily.  Marland Kitchen atorvastatin (LIPITOR) 10 MG tablet TAKE 1 TABLET BY MOUTH  DAILY  . ferrous sulfate 325 (65 FE) MG tablet Take 1 tablet (325 mg total) by mouth daily with breakfast.  . gabapentin (NEURONTIN) 300 MG capsule Take 1 capsule (300 mg total) by mouth 3 (three) times daily.  Marland Kitchen loratadine (CLARITIN) 10 MG tablet Take 10 mg by mouth daily.  Marland Kitchen losartan (COZAAR) 100 MG tablet TAKE 1 TABLET BY MOUTH  DAILY  . meclizine (ANTIVERT) 25 MG tablet TAKE 1 TABLET(25 MG) BY MOUTH THREE TIMES DAILY AS NEEDED FOR DIZZINESS  . MELATONIN PO  Take by mouth.  . metFORMIN (GLUCOPHAGE) 500 MG tablet Take 1 tablet (500 mg total) by mouth 2 (two) times daily with a meal.  . mometasone (NASONEX) 50 MCG/ACT nasal spray Place 2 sprays into the nose daily.  . Multiple Vitamins-Minerals (ZINC PO) Take by mouth.  . pantoprazole (PROTONIX) 40 MG tablet Take 1 tablet (40 mg total) by mouth 2 (two) times daily.  . sertraline (ZOLOFT) 50 MG tablet TAKE 1 TABLET(50 MG) BY MOUTH DAILY (Patient taking differently: Take half  tablet (25 mg) every night for two weeks then stop.)  . tiZANidine (ZANAFLEX) 4 MG tablet Take 1 tablet (4 mg total) by mouth at bedtime.  . traZODone (DESYREL) 100 MG tablet TAKE 1 TABLET BY MOUTH AT  BEDTIME   No facility-administered encounter medications on file as of 12/24/2019.   Current Diagnosis/Assessment: Goals Addressed            This Visit's Progress   . PharmD Care Plan   On track    CARE PLAN ENTRY Current Barriers:  . Chronic Disease Management support, education, and care coordination needs related to Diabetes and Anxiety/Depression   Diabetes . Pharmacist Clinical Goal(s): o Over the next 90 days, patient will work with PharmD and providers to achieve A1c goal <8% . Current regimen:  o Metformin 500 mg twice daily  . Patient self care activities - Over the next 90 days, patient will: o Continue metformin 500 mg twice daily Anxiety/Depression . Pharmacist Clinical Goal(s) o Over the next 90 days, patient will work with PharmD and providers to minimize anxiety related symptoms. . Current regimen:  o Tapering off of sertraline: currently half of the 50 mg (25 mg) until tablets are gone . Interventions: o Continue current taper of sertraline . Patient self care activities - Over the next 90 days, patient will: o Continue current taper of sertraline at half a tablet (25 mg) until gone  Follow-up visit goal documentation.        Diabetes   Recent Relevant Labs: Lab Results  Component Value  Date/Time   HGBA1C 8.4 (H) 08/20/2019 10:44 AM   HGBA1C 7.3 (H) 05/20/2019 02:22 PM   EGFR 71 (L) 07/13/2015 03:16 PM    Patient has failed these meds in past: n/a.  According to January 2021 lab was uncontrolled - at this time increased to metformin 500 mg twice daily. Last visit 10/2019 was encouraged to follow-up on a1c. Labs remain past due. Patient reminded of f/u on labs during today's visit.  Plan Continue current medications and control with diet and exercise.    Anxiety   Discussed sertraline 50 mg taper at last visit - previous plan was to take half tablet (25 mg) daily for 2 weeks then stop taking sertraline.  Patient wishes to halve tablet until medication is gone then stop. Denies any worsening anxiety or side effects.   Plan Take half of sertraline 50 mg tablet (25 mg) until gone.     GERD   Acid reflux is ongoing issue for patient. Has recently switched from omeprazole 40 mg to pantoprazole 40 mg. Still experiencing symptoms of reflux/cough and was originally set up to see pulm 12/01/2019. This has since been rescheduled to 12/30/2019.   Plan  Continue current medication.   Follow-Up: 1 month telephone vist DM, anxiety, gerd/pulm    [name], Pharm.D. Clinical Pharmacist [facility] at [facility] [phone]

## 2019-12-24 NOTE — Patient Instructions (Addendum)
Visit Information  Goals Addressed            This Visit's Progress   . PharmD Care Plan   On track    CARE PLAN ENTRY Current Barriers:  . Chronic Disease Management support, education, and care coordination needs related to Diabetes and Anxiety/Depression   Diabetes . Pharmacist Clinical Goal(s): o Over the next 90 days, patient will work with PharmD and providers to achieve A1c goal <8% . Current regimen:  o Metformin 500 mg twice daily  . Patient self care activities - Over the next 90 days, patient will: o Continue metformin 500 mg twice daily Anxiety/Depression . Pharmacist Clinical Goal(s) o Over the next 90 days, patient will work with PharmD and providers to minimize anxiety related symptoms. . Current regimen:  o Tapering off of sertraline: currently half of the 50 mg (25 mg) until tablets are gone . Interventions: o Continue current taper of sertraline . Patient self care activities - Over the next 90 days, patient will: o Continue current taper of sertraline at half a tablet (25 mg) until gone  Follow-up visit goal documentation.       The patient verbalized understanding of instructions provided today and agreed to receive a mailed copy of patient instruction and/or educational materials.  Telephone follow up appointment with pharmacy team member scheduled for: See next appointment with "Care Management Staff" under "What's Next" below.   Thank you!  [name], Pharm.D., BCGP Clinical Pharmacist [facility] [phone] Diabetes Mellitus and Nutrition, Adult When you have diabetes (diabetes mellitus), it is very important to have healthy eating habits because your blood sugar (glucose) levels are greatly affected by what you eat and drink. Eating healthy foods in the appropriate amounts, at about the same times every day, can help you:  Control your blood glucose.  Lower your risk of heart disease.  Improve your blood  pressure.  Reach or maintain a healthy weight. Every person with diabetes is different, and each person has different needs for a meal plan. Your health care provider may recommend that you work with a diet and nutrition specialist (dietitian) to make a meal plan that is best for you. Your meal plan may vary depending on factors such as:  The calories you need.  The medicines you take.  Your weight.  Your blood glucose, blood pressure, and cholesterol levels.  Your activity level.  Other health conditions you have, such as heart or kidney disease. How do carbohydrates affect me? Carbohydrates, also called carbs, affect your blood glucose level more than any other type of food. Eating carbs naturally raises the amount of glucose in your blood. Carb counting is a method for keeping track of how many carbs you eat. Counting carbs is important to keep your blood glucose at a healthy level, especially if you use insulin or take certain oral diabetes medicines. It is important to know how many carbs you can safely have in each meal. This is different for every person. Your dietitian can help you calculate how many carbs you should have at each meal and for each snack. Foods that contain carbs include:  Bread, cereal, rice, pasta, and crackers.  Potatoes and corn.  Peas, beans, and lentils.  Milk and yogurt.  Fruit and juice.  Desserts, such as cakes, cookies, ice cream, and candy. How does alcohol affect me? Alcohol can cause a sudden decrease in blood glucose (hypoglycemia), especially if you use insulin or take certain  oral diabetes medicines. Hypoglycemia can be a life-threatening condition. Symptoms of hypoglycemia (sleepiness, dizziness, and confusion) are similar to symptoms of having too much alcohol. If your health care provider says that alcohol is safe for you, follow these guidelines:  Limit alcohol intake to no more than 1 drink per day for nonpregnant women and 2 drinks per  day for men. One drink equals 12 oz of beer, 5 oz of wine, or 1 oz of hard liquor.  Do not drink on an empty stomach.  Keep yourself hydrated with water, diet soda, or unsweetened iced tea.  Keep in mind that regular soda, juice, and other mixers may contain a lot of sugar and must be counted as carbs. What are tips for following this plan?  Reading food labels  Start by checking the serving size on the "Nutrition Facts" label of packaged foods and drinks. The amount of calories, carbs, fats, and other nutrients listed on the label is based on one serving of the item. Many items contain more than one serving per package.  Check the total grams (g) of carbs in one serving. You can calculate the number of servings of carbs in one serving by dividing the total carbs by 15. For example, if a food has 30 g of total carbs, it would be equal to 2 servings of carbs.  Check the number of grams (g) of saturated and trans fats in one serving. Choose foods that have low or no amount of these fats.  Check the number of milligrams (mg) of salt (sodium) in one serving. Most people should limit total sodium intake to less than 2,300 mg per day.  Always check the nutrition information of foods labeled as "low-fat" or "nonfat". These foods may be higher in added sugar or refined carbs and should be avoided.  Talk to your dietitian to identify your daily goals for nutrients listed on the label. Shopping  Avoid buying canned, premade, or processed foods. These foods tend to be high in fat, sodium, and added sugar.  Shop around the outside edge of the grocery store. This includes fresh fruits and vegetables, bulk grains, fresh meats, and fresh dairy. Cooking  Use low-heat cooking methods, such as baking, instead of high-heat cooking methods like deep frying.  Cook using healthy oils, such as olive, canola, or sunflower oil.  Avoid cooking with butter, cream, or high-fat meats. Meal planning  Eat  meals and snacks regularly, preferably at the same times every day. Avoid going long periods of time without eating.  Eat foods high in fiber, such as fresh fruits, vegetables, beans, and whole grains. Talk to your dietitian about how many servings of carbs you can eat at each meal.  Eat 4-6 ounces (oz) of lean protein each day, such as lean meat, chicken, fish, eggs, or tofu. One oz of lean protein is equal to: ? 1 oz of meat, chicken, or fish. ? 1 egg. ?  cup of tofu.  Eat some foods each day that contain healthy fats, such as avocado, nuts, seeds, and fish. Lifestyle  Check your blood glucose regularly.  Exercise regularly as told by your health care provider. This may include: ? 150 minutes of moderate-intensity or vigorous-intensity exercise each week. This could be brisk walking, biking, or water aerobics. ? Stretching and doing strength exercises, such as yoga or weightlifting, at least 2 times a week.  Take medicines as told by your health care provider.  Do not use any products that contain nicotine  or tobacco, such as cigarettes and e-cigarettes. If you need help quitting, ask your health care provider.  Work with a Social worker or diabetes educator to identify strategies to manage stress and any emotional and social challenges. Questions to ask a health care provider  Do I need to meet with a diabetes educator?  Do I need to meet with a dietitian?  What number can I call if I have questions?  When are the best times to check my blood glucose? Where to find more information:  American Diabetes Association: diabetes.org  Academy of Nutrition and Dietetics: www.eatright.[employer] of Diabetes and Digestive and Kidney Diseases (NIH): [employer] Summary  A healthy meal plan will help you control your blood glucose and maintain a healthy lifestyle.  Working with a diet and nutrition specialist (dietitian) can help you make a meal plan that is best for  you.  Keep in mind that carbohydrates (carbs) and alcohol have immediate effects on your blood glucose levels. It is important to count carbs and to use alcohol carefully. This information is not intended to replace advice given to you by your health care provider. Make sure you discuss any questions you have with your health care provider. Document Revised: 06/27/2017 Document Reviewed: 08/19/2016 Elsevier Patient Education  2020 [employer].

## 2019-12-30 ENCOUNTER — Ambulatory Visit: Payer: Medicare Other | Admitting: Critical Care Medicine

## 2019-12-30 ENCOUNTER — Other Ambulatory Visit: Payer: Self-pay

## 2019-12-30 ENCOUNTER — Encounter: Payer: Self-pay | Admitting: Critical Care Medicine

## 2019-12-30 VITALS — BP 120/60 | HR 80 | Temp 98.2°F

## 2019-12-30 DIAGNOSIS — R1310 Dysphagia, unspecified: Secondary | ICD-10-CM | POA: Diagnosis not present

## 2019-12-30 DIAGNOSIS — R05 Cough: Secondary | ICD-10-CM

## 2019-12-30 DIAGNOSIS — R634 Abnormal weight loss: Secondary | ICD-10-CM | POA: Diagnosis not present

## 2019-12-30 DIAGNOSIS — R0789 Other chest pain: Secondary | ICD-10-CM | POA: Diagnosis not present

## 2019-12-30 DIAGNOSIS — K219 Gastro-esophageal reflux disease without esophagitis: Secondary | ICD-10-CM | POA: Diagnosis not present

## 2019-12-30 DIAGNOSIS — R059 Cough, unspecified: Secondary | ICD-10-CM

## 2019-12-30 NOTE — Patient Instructions (Addendum)
Thank you for visiting Dr. Carlis Abbott at [facility]. We recommend the following: Orders Placed This Encounter  Procedures  . DG ESOPHAGUS W SINGLE CM (SOL OR THIN BA)  . CT ABDOMEN PELVIS W CONTRAST  . EKG 12-Lead  . Pulmonary function test   Orders Placed This Encounter  Procedures  . DG ESOPHAGUS W SINGLE CM (SOL OR THIN BA)    Standing Status:   Future    Standing Expiration Date:   12/29/2020    Order Specific Question:   Reason for Exam (SYMPTOM  OR DIAGNOSIS REQUIRED)    Answer:   cough    Order Specific Question:   Preferred imaging location?    Answer:   [facility]    Order Specific Question:   Radiology Contrast Protocol - do NOT remove file path    Answer:   \\[email or web address]  . CT ABDOMEN PELVIS W CONTRAST    First available    Standing Status:   Future    Standing Expiration Date:   12/29/2020    Order Specific Question:   ** REASON FOR EXAM (FREE TEXT)    Answer:   early satiety, GERD    Order Specific Question:   If indicated for the ordered procedure, I authorize the administration of contrast media per Radiology protocol    Answer:   Yes    Order Specific Question:   Preferred imaging location?    Answer:   [facility]    Order Specific Question:   Is Oral Contrast requested for this exam?    Answer:   Yes, Per Radiology protocol    Order Specific Question:   Radiology Contrast Protocol - do NOT remove file path    Answer:   \\[email or web address]  . EKG 12-Lead    Standing Status:   Future    Standing Expiration Date:   12/29/2020  . Pulmonary function test    Standing Status:   Future    Standing Expiration Date:   12/29/2020    Order Specific Question:   Where should this test be performed?    Answer:   [facility]    Order Specific Question:   Full PFT: includes the following: basic spirometry, spirometry pre & post bronchodilator, diffusion capacity (DLCO), lung volumes   Answer:   Full PFT    If your chest pain is getting worse or you are developing new symptoms like shortness of breath or dizziness, you must call 911 and go to the ED for evaluation!   No orders of the defined types were placed in this encounter.   Return in about 2 weeks (around 01/13/2020).    Please do your part to reduce the spread of COVID-19.

## 2019-12-30 NOTE — Progress Notes (Signed)
Synopsis: Referred in June 2021 for chronic bronchitis by [name], MD.  Subjective:   PATIENT ID: Tracy [name] GENDER: female DOB: [birth year], MRN: [id number]  Chief Complaint  Patient presents with  . Consult    cough increased at night     Tracy [name] is a 74 year old woman who presents for evaluation of nocturnal cough and white thick sputum production.  She has previously been evaluated by ENT (Dr. Redmond Baseman at [facility] in 09/2019- note reviewed) due to concern for postnasal drip causing cough, but he was concerned that this was more related to GERD.  She had no notable sinus disease.  She wakes up at night short of breath and has to cough to clear her throat.  She always coughs up thick white sputum.  This happens occasionally throughout the day.  Her episodes have been going on for about a year, but do not occur every day.  Sometimes after these episodes she has a sour taste in her mouth.  When she wakes up in the morning she has a sore throat, which she treats with tea and honey to soothe her throat.  When she swallows she has a sticking sensation in her chest and occasionally has heartburn.  Today she has left-sided chest pain is an ache that radiates into her arm and back, and she has had this several other times before.  The pain is gradually becoming more persistent.  This is associated with movement, including walking.  She does not think this is something that warrants going to the emergency room.  This pain has been becoming more persistent over time.  She gets shortness of breath associated with walking and moving around as well.  This has been going on since the belching and reflux issues started.  She thinks this is related to the chest pain.  She occasionally has wheezing, which improves with albuterol. She has allergies, which are well controlled with Claritin.  Her PCP recently increased her GERD regimen from omeprazole to pantoprazole twice daily; she cannot tell a significant  difference.  She has frequently more belching, which she has been taking Gas-X in addition to her PPI.  She notices that she gets full faster, has post-prandial nausea and abdominal distention for about the past month, but no pain. Yesterday she had to vomit after eating lunch. She has been losing weigh, but has been modifying her diet some too.  She quit smoking in 2013 after 20 years x 0.5 packs/day.  No history of vaping.  She has a history of bilateral breast cancer- in the early 2000's and again in 2016.  She had radiation on both sides.  She has chronic lymphedema in the left arm from previous breast cancer surgery.  No postnasal drip hoarseness    Past Medical History:  Diagnosis Date  . Anxiety   . Arthritis   . Breast cancer ([place]) 06/13/15  . Cancer ([place]) 2000   breast cancer  . Chronic bronchitis ([place])   . Chronic bronchitis ([place])   . Hyperlipidemia   . Hypertension   . Myocardial infarction ([place]) 2001  . Personal history of radiation therapy   . Restless leg   . Stroke [facility]) 2004   TIA, no deficits     Family History  Problem Relation Age of Onset  . Diabetes Father   . Lung cancer Sister        dx. <50; former smoker  . Diabetes Brother   . Diabetes Paternal Aunt   .  Stroke Maternal Grandmother   . Diabetes Paternal Grandmother   . Emphysema Mother 35       smoker  . Diabetes Brother   . Brain cancer Brother 77       unknown tumor type  . Cancer Daughter 43       neck cancer  . Other Daughter        hysterectomy for unspecified reason  . Breast cancer Cousin   . Cancer Cousin        unspecified type  . Breast cancer Other        triple negative breast cancer in her 73s  . Cancer Daughter   . Colon polyps Neg Hx   . Esophageal cancer Neg Hx   . Gallbladder disease Neg Hx      Past Surgical History:  Procedure Laterality Date  . ABDOMINAL HYSTERECTOMY  1985  . BREAST LUMPECTOMY Left 2000   radiation and chemo  . BREAST LUMPECTOMY Right 2016    radiation  . BREAST SURGERY  2001   lt breast lumpectomy  . RADIOACTIVE SEED GUIDED PARTIAL MASTECTOMY WITH AXILLARY SENTINEL LYMPH NODE BIOPSY Right 07/07/2015   Procedure: RIGHT RADIOACTIVE SEED GUIDED PARTIAL MASTECTOMY WITH AXILLARY SENTINEL LYMPH NODE BIOPSY;  Surgeon: Autumn Messing III, MD;  Location: [place];  Service: General;  Laterality: Right;  . SMALL INTESTINE SURGERY    . TUBAL LIGATION      Social History   Socioeconomic History  . Marital status: Divorced    Spouse name: Not on file  . Number of children: 7  . Years of education: Not on file  . Highest education level: Not on file  Occupational History  . Occupation: retired  Tobacco Use  . Smoking status: Former Smoker    Packs/day: 1.00    Years: 20.00    Pack years: 20.00    Types: Cigarettes    Quit date: 07/30/2011    Years since quitting: 8.4  . Smokeless tobacco: Never Used  . Tobacco comment: Quit >4 years ago; 1 ppd for about 5/20 years (remaining was less)  Substance and Sexual Activity  . Alcohol use: No    Alcohol/week: 0.0 standard drinks  . Drug use: No  . Sexual activity: Not on file  Other Topics Concern  . Not on file  Social History Narrative   Lives alone.  Retired.  Education:  11th grade GED.  Children:  7 (one here).    Social Determinants of Health   Financial Resource Strain:   . Difficulty of Paying Living Expenses:   Food Insecurity: No Food Insecurity  . Worried About Charity fundraiser in the Last Year: Never true  . Ran Out of Food in the Last Year: Never true  Transportation Needs: No Transportation Needs  . Lack of Transportation (Medical): No  . Lack of Transportation (Non-Medical): No  Physical Activity:   . Days of Exercise per Week:   . Minutes of Exercise per Session:   Stress:   . Feeling of Stress :   Social Connections:   . Frequency of Communication with Friends and Family:   . Frequency of Social Gatherings with Friends and Family:   .  Attends Religious Services:   . Active Member of Clubs or Organizations:   . Attends Archivist Meetings:   Marland Kitchen Marital Status:   Intimate Partner Violence:   . Fear of Current or Ex-Partner:   . Emotionally Abused:   Marland Kitchen Physically Abused:   .  Sexually Abused:      Allergies  Allergen Reactions  . Bacitracin-Neomycin-Polymyxin  [Neomycin-Bacitracin Zn-Polymyx] Swelling  . Nsaids Other (See Comments)    nosebleeds  . Tape Hives    Pt cannot tolerate bandaids, tape, or any other adhesives.   . Ambien [Zolpidem Tartrate]     Hives   . Amoxicillin     Rash   . Contrast Media [Iodinated Diagnostic Agents] Hives    Pt states hives with prior ct, was given benadryl to resolve  . Latex Swelling  . Prednisone      Immunization History  Administered Date(s) Administered  . Influenza Split 04/28/2010, 06/24/2011  . Influenza Whole 04/22/2013  . Influenza, High Dose Seasonal PF 05/30/2016, 05/20/2018, 04/09/2019  . Influenza,inj,Quad PF,6+ Mos 04/29/2015, 05/16/2017  . Influenza-Unspecified 05/03/2014  . Moderna SARS-COVID-2 Vaccination 09/27/2019, 10/25/2019  . Pneumococcal Conjugate-13 06/01/2015  . Pneumococcal Polysaccharide-23 05/10/2014  . Td 04/05/2013  . Zoster 05/01/2010    Outpatient Medications Prior to Visit  Medication Sig Dispense Refill  . albuterol (PROAIR HFA) 108 (90 Base) MCG/ACT inhaler Inhale 2 puffs into the lungs every 4 (four) hours as needed for wheezing. 1 Inhaler 6  . Ascorbic Acid (VITAMIN C PO) Take by mouth.    Marland Kitchen aspirin 81 MG tablet Take 81 mg by mouth daily.    Marland Kitchen atorvastatin (LIPITOR) 10 MG tablet TAKE 1 TABLET BY MOUTH  DAILY 90 tablet 3  . ferrous sulfate 325 (65 FE) MG tablet Take 1 tablet (325 mg total) by mouth daily with breakfast. 30 tablet 6  . gabapentin (NEURONTIN) 300 MG capsule Take 1 capsule (300 mg total) by mouth 3 (three) times daily. 270 capsule 1  . loratadine (CLARITIN) 10 MG tablet Take 10 mg by mouth daily.    Marland Kitchen  losartan (COZAAR) 100 MG tablet TAKE 1 TABLET BY MOUTH  DAILY 90 tablet 3  . meclizine (ANTIVERT) 25 MG tablet TAKE 1 TABLET(25 MG) BY MOUTH THREE TIMES DAILY AS NEEDED FOR DIZZINESS 30 tablet 3  . MELATONIN PO Take by mouth.    . metFORMIN (GLUCOPHAGE) 500 MG tablet Take 1 tablet (500 mg total) by mouth 2 (two) times daily with a meal. 180 tablet 0  . mometasone (NASONEX) 50 MCG/ACT nasal spray Place 2 sprays into the nose daily. 17 g 12  . Multiple Vitamins-Minerals (ZINC PO) Take by mouth.    . pantoprazole (PROTONIX) 40 MG tablet Take 1 tablet (40 mg total) by mouth 2 (two) times daily. 60 tablet 3  . sertraline (ZOLOFT) 50 MG tablet TAKE 1 TABLET(50 MG) BY MOUTH DAILY (Patient taking differently: Take half tablet (25 mg) every night for two weeks then stop.) 90 tablet 0  . tiZANidine (ZANAFLEX) 4 MG tablet Take 1 tablet (4 mg total) by mouth at bedtime. 30 tablet 3  . acetaminophen-codeine (TYLENOL #3) 300-30 MG tablet Take by mouth every 4 (four) hours as needed for moderate pain.    . traZODone (DESYREL) 100 MG tablet TAKE 1 TABLET BY MOUTH AT  BEDTIME 90 tablet 3   No facility-administered medications prior to visit.    Review of Systems  Constitutional: Positive for weight loss. Negative for fever.  HENT: Negative for congestion.        No post nasal drip; +hoarseness  Respiratory: Positive for cough, sputum production, shortness of breath and wheezing.   Cardiovascular: Positive for chest pain.  Gastrointestinal: Positive for heartburn, nausea and vomiting.  Neurological: Negative for dizziness and weakness.  Endo/Heme/Allergies: Positive  for environmental allergies.     Objective:   Vitals:   12/30/19 1438  BP: 120/60  Pulse: 80  Temp: 98.2 F (36.8 C)  TempSrc: Oral  SpO2: 96%   96% on   RA BMI Readings from Last 3 Encounters:  11/03/19 38.47 kg/m  08/20/19 39.65 kg/m  07/15/19 38.67 kg/m   Wt Readings from Last 3 Encounters:  11/03/19 197 lb (89.4 kg)    08/20/19 203 lb (92.1 kg)  07/15/19 198 lb (89.8 kg)    Physical Exam Vitals reviewed.  Constitutional:      General: She is not in acute distress.    Appearance: Normal appearance. She is obese. She is not ill-appearing.  HENT:     Head: Normocephalic and atraumatic.     Mouth/Throat:     Comments: Mallampati 4, unable to see pharynx. Eyes:     General: No scleral icterus. Cardiovascular:     Rate and Rhythm: Normal rate and regular rhythm.     Heart sounds: No murmur.  Pulmonary:     Comments: Breathing comfortably on room air, no conversational dyspnea.  Clear to auscultation bilaterally. Abdominal:     General: There is distension.     Palpations: Abdomen is soft.     Tenderness: There is no abdominal tenderness. There is no guarding.     Comments: And appeared to have an episode of regurgitation during the visit.  Hoarseness after this episode.  Musculoskeletal:        General: No deformity.     Cervical back: Neck supple.     Comments: Lymphedema left upper extremity, no lower extremity edema.  Lymphadenopathy:     Cervical: No cervical adenopathy.  Skin:    General: Skin is warm and dry.     Findings: No rash.  Neurological:     General: No focal deficit present.     Mental Status: She is alert.     Coordination: Coordination normal.  Psychiatric:        Mood and Affect: Mood normal.        Behavior: Behavior normal.      CBC    Component Value Date/Time   WBC 7.2 08/20/2019 1044   RBC 2.95 (L) 08/20/2019 1044   HGB 9.3 (L) 08/20/2019 1044   HGB 9.7 (L) 07/13/2015 1516   HCT 28.0 (L) 08/20/2019 1044   HCT 30.2 (L) 07/13/2015 1516   PLT 247.0 08/20/2019 1044   PLT 317 07/13/2015 1516   MCV 94.7 08/20/2019 1044   MCV 86.8 07/13/2015 1516   MCH 25.7 (L) 09/14/2015 0417   MCHC 33.1 08/20/2019 1044   RDW 14.6 08/20/2019 1044   RDW 14.5 07/13/2015 1516   LYMPHSABS 2.9 08/20/2019 1044   LYMPHSABS 3.7 (H) 07/13/2015 1516   MONOABS 0.4 08/20/2019 1044    MONOABS 0.5 07/13/2015 1516   EOSABS 0.1 08/20/2019 1044   EOSABS 0.1 07/13/2015 1516   BASOSABS 0.1 08/20/2019 1044   BASOSABS 0.0 07/13/2015 1516    CHEMISTRY No results for input(s): NA, K, CL, CO2, GLUCOSE, BUN, CREATININE, CALCIUM, MG, PHOS in the last 168 hours. CrCl cannot be calculated (Patient's most recent lab result is older than the maximum 21 days allowed.).   Chest Imaging- films reviewed: CXR, 2 view 10/12/2018-axillary surgical clips on the right.  Increased bibasilar lung markings.  Pulmonary Functions Testing Results: No flowsheet data found.   EKG 12/30/2019-normal sinus rhythm, normal axis.  PR, QRS, QTc interval.  Normal R wave progression across  precordial's.  No ischemic ST changes or T wave inversions.  No changes compared to 09/28/2018 EKG.     Assessment & Plan:     ICD-10-CM   1. Cough  R05 DG ESOPHAGUS W SINGLE CM (SOL OR THIN BA)    CT ABDOMEN PELVIS W CONTRAST    Pulmonary function test  2. Dysphagia, unspecified type  R13.10 CT ABDOMEN PELVIS W CONTRAST  3. Gastroesophageal reflux disease, unspecified whether esophagitis present  K21.9 CT ABDOMEN PELVIS W CONTRAST  4. Weight loss  R63.4 CT ABDOMEN PELVIS W CONTRAST  5. Other chest pain  R07.89 EKG 12-Lead    Atypical persistent chest pain -Discussed recommendation to go to the emergency department.  She declined.  With persistence of her symptoms and frequent history of GERD and regurgitation, this is the most likely cause.  EKG in the office today without ischemic changes.  Strict ER precautions discussed that she should go to the ED if her chest pain is worsening or associated with new or worsening symptoms.  Cough with frequent regurgitation.  With her history of abdominal distention, early satiety, weight loss,and dysphagia I am concerned about obstructive processes in the chest or abdomen. -Barium esophagram -CT with oral contrast.  Has previous history of allergy to IV contrast. -Continue PPI  twice daily -Okay to continue Gas-X -PFTs; okay if these are done after follow-up visit. -Continue albuterol as needed for now.  RTC in 2 weeks.   I spent 65 minutes on this encounter, including face to face time and non-face to face time spent reviewing records, charting, coordinating care, etc.   Current Outpatient Medications:  .  albuterol (PROAIR HFA) 108 (90 Base) MCG/ACT inhaler, Inhale 2 puffs into the lungs every 4 (four) hours as needed for wheezing., Disp: 1 Inhaler, Rfl: 6 .  Ascorbic Acid (VITAMIN C PO), Take by mouth., Disp: , Rfl:  .  aspirin 81 MG tablet, Take 81 mg by mouth daily., Disp: , Rfl:  .  atorvastatin (LIPITOR) 10 MG tablet, TAKE 1 TABLET BY MOUTH  DAILY, Disp: 90 tablet, Rfl: 3 .  ferrous sulfate 325 (65 FE) MG tablet, Take 1 tablet (325 mg total) by mouth daily with breakfast., Disp: 30 tablet, Rfl: 6 .  gabapentin (NEURONTIN) 300 MG capsule, Take 1 capsule (300 mg total) by mouth 3 (three) times daily., Disp: 270 capsule, Rfl: 1 .  loratadine (CLARITIN) 10 MG tablet, Take 10 mg by mouth daily., Disp: , Rfl:  .  losartan (COZAAR) 100 MG tablet, TAKE 1 TABLET BY MOUTH  DAILY, Disp: 90 tablet, Rfl: 3 .  meclizine (ANTIVERT) 25 MG tablet, TAKE 1 TABLET(25 MG) BY MOUTH THREE TIMES DAILY AS NEEDED FOR DIZZINESS, Disp: 30 tablet, Rfl: 3 .  MELATONIN PO, Take by mouth., Disp: , Rfl:  .  metFORMIN (GLUCOPHAGE) 500 MG tablet, Take 1 tablet (500 mg total) by mouth 2 (two) times daily with a meal., Disp: 180 tablet, Rfl: 0 .  mometasone (NASONEX) 50 MCG/ACT nasal spray, Place 2 sprays into the nose daily., Disp: 17 g, Rfl: 12 .  Multiple Vitamins-Minerals (ZINC PO), Take by mouth., Disp: , Rfl:  .  pantoprazole (PROTONIX) 40 MG tablet, Take 1 tablet (40 mg total) by mouth 2 (two) times daily., Disp: 60 tablet, Rfl: 3 .  sertraline (ZOLOFT) 50 MG tablet, TAKE 1 TABLET(50 MG) BY MOUTH DAILY (Patient taking differently: Take half tablet (25 mg) every night for two weeks then  stop.), Disp: 90 tablet, Rfl: 0 .  tiZANidine (ZANAFLEX) 4 MG tablet, Take 1 tablet (4 mg total) by mouth at bedtime., Disp: 30 tablet, Rfl: 3 .  acetaminophen-codeine (TYLENOL #3) 300-30 MG tablet, Take by mouth every 4 (four) hours as needed for moderate pain., Disp: , Rfl:  .  traZODone (DESYREL) 100 MG tablet, TAKE 1 TABLET BY MOUTH AT  BEDTIME, Disp: 90 tablet, Rfl: 3      Julian Hy, DO [facility] 12/30/2019 3:15 PM

## 2019-12-31 ENCOUNTER — Telehealth: Payer: Self-pay | Admitting: Critical Care Medicine

## 2019-12-31 NOTE — Telephone Encounter (Signed)
Dr. Carlis Abbott, a CT chest with contrast was ordered. Can we place order for BUN/Creatinine, her last BMET was in January. Her appt is on 01/07/2020. Please advise.

## 2019-12-31 NOTE — Telephone Encounter (Signed)
That is fine, but she isnt getting IV contrast- only oral, and usually they don't need it for oral contrast.  Julian Hy, DO 12/31/19 8:54 PM [facility]

## 2020-01-03 NOTE — Telephone Encounter (Signed)
Dr. Carlis Abbott, please see message from [place] about info received from [facility].

## 2020-01-03 NOTE — Telephone Encounter (Signed)
I have called Tracy [name] in [place] scheduling she said that it is a drink contrast and a IV then we noticed that the patient is allergic to iv contrast sh will  Need the pre mediciation info I have told the patient to come pick up the contrast to drink maybe she can do the labs then

## 2020-01-04 ENCOUNTER — Telehealth: Payer: Self-pay | Admitting: Gastroenterology

## 2020-01-04 NOTE — Addendum Note (Signed)
Addended by: Amado Coe on: 01/04/2020 09:42 AM   Modules accepted: Orders

## 2020-01-04 NOTE — Telephone Encounter (Signed)
Tracy [name] from [place] called and requested an i-STAT creatinine order put in for pt.

## 2020-01-04 NOTE — Telephone Encounter (Signed)
I am aware of the IV contrast allergy. If the radiologist feels it is appropriate, we can premedicate her, but my preference would be to do oral contrast only. They don't have an order for that, but my order has that written in. If they won't do that, the please order the premedication protocol and the labs. Thank you.  Julian Hy, DO 01/04/20 7:18 AM [facility]

## 2020-01-04 NOTE — Telephone Encounter (Signed)
Please disregard message below.  Tedra Coupe gave the wrong patient.

## 2020-01-07 ENCOUNTER — Telehealth: Payer: Self-pay | Admitting: Critical Care Medicine

## 2020-01-07 ENCOUNTER — Ambulatory Visit (HOSPITAL_COMMUNITY)
Admission: RE | Admit: 2020-01-07 | Discharge: 2020-01-07 | Disposition: A | Discharge status: Home / Self Care | Payer: Medicare Other | Source: Ambulatory Visit | Attending: Critical Care Medicine | Admitting: Critical Care Medicine

## 2020-01-07 ENCOUNTER — Other Ambulatory Visit: Payer: Self-pay | Admitting: Critical Care Medicine

## 2020-01-07 ENCOUNTER — Other Ambulatory Visit: Payer: Self-pay

## 2020-01-07 DIAGNOSIS — M899 Disorder of bone, unspecified: Secondary | ICD-10-CM | POA: Insufficient documentation

## 2020-01-07 DIAGNOSIS — K760 Fatty (change of) liver, not elsewhere classified: Secondary | ICD-10-CM | POA: Insufficient documentation

## 2020-01-07 DIAGNOSIS — K219 Gastro-esophageal reflux disease without esophagitis: Secondary | ICD-10-CM | POA: Diagnosis not present

## 2020-01-07 DIAGNOSIS — I700 Atherosclerosis of aorta: Secondary | ICD-10-CM | POA: Diagnosis not present

## 2020-01-07 DIAGNOSIS — R05 Cough: Secondary | ICD-10-CM | POA: Diagnosis not present

## 2020-01-07 DIAGNOSIS — R059 Cough, unspecified: Secondary | ICD-10-CM

## 2020-01-07 NOTE — Telephone Encounter (Signed)
Call report on CT abdomen/pelvis. Will forward to Dr. Carlis Abbott.   IMPRESSION: 1. No explanation for patient's symptoms. No acute process in the abdomen or pelvis. 2. Development of lytic lesions within the pelvis and L5 vertebral body. Given history of primary malignancy, most likely metastatic disease. Multiple myeloma could look similar. 3. Hepatic steatosis. 4.  Aortic Atherosclerosis (ICD10-I70.0).

## 2020-01-10 ENCOUNTER — Telehealth: Payer: Medicare Other

## 2020-01-10 ENCOUNTER — Other Ambulatory Visit: Payer: Self-pay

## 2020-01-10 NOTE — Progress Notes (Unsigned)
[facility]  Name: Tracy [name]  MRN: [id number] DOB: 14-Apr-1946  Chief Complaint/ HPI  Tracy [name],  74 y.o. , female presents for their Initial CCM visit with the clinical pharmacist via telephone due to COVID-19 Pandemic.  PCP : [name], MD  Their chronic conditions include: Anxiety, HLD, HTN, MI, TIA, RLS.  Office Visits: 11/24/2019 [facility]): started sertraline taper, counseled on metformin and recommended f/u on DM labs. 11/03/2019 (PCP): ongoing reflux symptoms, omeprazole 40 mg switched to pantoprazole 40 mg  08/20/2019 (PCP): A1c increased to 8.4 from 6.6 in 10 months, started on metformin 500 mg twice daily. Hgb lower to 9.3, encouraged to take iron supplement.   Consult Visit: 6/3: upcoming pulmonology appointment with pulmonary   No diagnosis found. Patient Active Problem List   Diagnosis Date Noted  . Allergic rhinitis 08/19/2018  . Type 2 diabetes mellitus with diabetic neuropathy, unspecified ([place]) 11/05/2017  . Encounter for long-term use of muscle relaxants 09/24/2017  . CAD in native artery 09/24/2017  . OSA (obstructive sleep apnea) 09/24/2017  . Snorings 09/24/2017  . Morbid obesity ([place]) 06/02/2017  . Depression 06/02/2017  . Iron deficiency anemia 09/23/2016  . Anemia of chronic disease 09/28/2015  . Genetic testing 08/21/2015  . History of left breast cancer 07/13/2015  . History of right breast cancer 06/20/2015  . Hearing loss due to cerumen impaction 12/29/2014  . Allergy to adhesive tape 05/19/2014  . Hyperlipidemia 12/06/2013  . GERD (gastroesophageal reflux disease) 12/06/2013  . Cervical disc disease 11/11/2013  . Osteopenia 05/25/2013  . Allergic asthma 12/24/2012  . Insomnia 04/02/2012  . Physical exam, annual 04/02/2012  . HTN (hypertension) 02/03/2012  . Vertigo, benign positional 02/03/2012  . Left groin pain 02/03/2012   Social History   Socioeconomic History  . Marital status: Divorced    Spouse  name: Not on file  . Number of children: 7  . Years of education: Not on file  . Highest education level: Not on file  Occupational History  . Occupation: retired  Tobacco Use  . Smoking status: Former Smoker    Packs/day: 1.00    Years: 20.00    Pack years: 20.00    Types: Cigarettes    Quit date: 07/30/2011    Years since quitting: 8.4  . Smokeless tobacco: Never Used  . Tobacco comment: Quit >4 years ago; 1 ppd for about 5/20 years (remaining was less)  Vaping Use  . Vaping Use: Former  Substance and Sexual Activity  . Alcohol use: No    Alcohol/week: 0.0 standard drinks  . Drug use: No  . Sexual activity: Not on file  Other Topics Concern  . Not on file  Social History Narrative   Lives alone.  Retired.  Education:  11th grade GED.  Children:  7 (one here).    Social Determinants of Health   Financial Resource Strain:   . Difficulty of Paying Living Expenses:   Food Insecurity: No Food Insecurity  . Worried About Charity fundraiser in the Last Year: Never true  . Ran Out of Food in the Last Year: Never true  Transportation Needs: No Transportation Needs  . Lack of Transportation (Medical): No  . Lack of Transportation (Non-Medical): No  Physical Activity:   . Days of Exercise per Week:   . Minutes of Exercise per Session:   Stress:   . Feeling of Stress :   Social Connections:   . Frequency of Communication with  Friends and Family:   . Frequency of Social Gatherings with Friends and Family:   . Attends Religious Services:   . Active Member of Clubs or Organizations:   . Attends Archivist Meetings:   Marland Kitchen Marital Status:    Family History  Problem Relation Age of Onset  . Diabetes Father   . Lung cancer Sister        dx. <50; former smoker  . Diabetes Brother   . Diabetes Paternal Aunt   . Stroke Maternal Grandmother   . Diabetes Paternal Grandmother   . Emphysema Mother 52       smoker  . Diabetes Brother   . Brain cancer Brother 73        unknown tumor type  . Cancer Daughter 22       neck cancer  . Other Daughter        hysterectomy for unspecified reason  . Breast cancer Cousin   . Cancer Cousin        unspecified type  . Breast cancer Other        triple negative breast cancer in her 63s  . Cancer Daughter   . Colon polyps Neg Hx   . Esophageal cancer Neg Hx   . Gallbladder disease Neg Hx    Allergies  Allergen Reactions  . Bacitracin-Neomycin-Polymyxin  [Neomycin-Bacitracin Zn-Polymyx] Swelling  . Nsaids Other (See Comments)    nosebleeds  . Tape Hives    Pt cannot tolerate bandaids, tape, or any other adhesives.   . Ambien [Zolpidem Tartrate]     Hives   . Amoxicillin     Rash   . Contrast Media [Iodinated Diagnostic Agents] Hives    Pt states hives with prior ct, was given benadryl to resolve  . Latex Swelling  . Prednisone    Outpatient Encounter Medications as of 01/10/2020  Medication Sig  . acetaminophen-codeine (TYLENOL #3) 300-30 MG tablet Take by mouth every 4 (four) hours as needed for moderate pain.  Marland Kitchen albuterol (PROAIR HFA) 108 (90 Base) MCG/ACT inhaler Inhale 2 puffs into the lungs every 4 (four) hours as needed for wheezing.  . Ascorbic Acid (VITAMIN C PO) Take by mouth.  Marland Kitchen aspirin 81 MG tablet Take 81 mg by mouth daily.  Marland Kitchen atorvastatin (LIPITOR) 10 MG tablet TAKE 1 TABLET BY MOUTH  DAILY  . ferrous sulfate 325 (65 FE) MG tablet Take 1 tablet (325 mg total) by mouth daily with breakfast.  . gabapentin (NEURONTIN) 300 MG capsule Take 1 capsule (300 mg total) by mouth 3 (three) times daily.  Marland Kitchen loratadine (CLARITIN) 10 MG tablet Take 10 mg by mouth daily.  Marland Kitchen losartan (COZAAR) 100 MG tablet TAKE 1 TABLET BY MOUTH  DAILY  . meclizine (ANTIVERT) 25 MG tablet TAKE 1 TABLET(25 MG) BY MOUTH THREE TIMES DAILY AS NEEDED FOR DIZZINESS  . MELATONIN PO Take by mouth.  . metFORMIN (GLUCOPHAGE) 500 MG tablet Take 1 tablet (500 mg total) by mouth 2 (two) times daily with a meal.  . mometasone (NASONEX)  50 MCG/ACT nasal spray Place 2 sprays into the nose daily.  . Multiple Vitamins-Minerals (ZINC PO) Take by mouth.  . pantoprazole (PROTONIX) 40 MG tablet Take 1 tablet (40 mg total) by mouth 2 (two) times daily.  . sertraline (ZOLOFT) 50 MG tablet TAKE 1 TABLET(50 MG) BY MOUTH DAILY (Patient taking differently: Take half tablet (25 mg) every night for two weeks then stop.)  . tiZANidine (ZANAFLEX) 4 MG tablet Take 1  tablet (4 mg total) by mouth at bedtime.  . traZODone (DESYREL) 100 MG tablet TAKE 1 TABLET BY MOUTH AT  BEDTIME   No facility-administered encounter medications on file as of 01/10/2020.   Current Diagnosis/Assessment: Goals Addressed   None     Diabetes   Recent Relevant Labs: Lab Results  Component Value Date/Time   HGBA1C 8.4 (H) 08/20/2019 10:44 AM   HGBA1C 7.3 (H) 05/20/2019 02:22 PM   EGFR 71 (L) 07/13/2015 03:16 PM    Patient has failed these meds in past: n/a.  According to January 2021 lab was uncontrolled - at this time increased to metformin 500 mg twice daily. Last visit 10/2019 was encouraged to follow-up on a1c. Labs remain past due. Patient reminded of f/u on labs during today's visit.  Plan Continue current medications and control with diet and exercise.    Anxiety   Discussed sertraline 50 mg taper at last visit - previous plan was to take half tablet (25 mg) daily for 2 weeks then stop taking sertraline.  Patient wishes to halve tablet until medication is gone then stop. Denies any worsening anxiety or side effects.   Plan Take half of sertraline 50 mg tablet (25 mg) until gone.     GERD   Acid reflux is ongoing issue for patient. Has recently switched from omeprazole 40 mg to pantoprazole 40 mg. Still experiencing symptoms of reflux/cough and was originally set up to see pulm 12/01/2019. This has since been rescheduled to 12/30/2019.   Plan  Continue current medication.   Follow-Up: 1 month telephone vist DM, anxiety, gerd/pulm    [name],  Pharm.D. Clinical Pharmacist [facility] [phone]

## 2020-01-11 ENCOUNTER — Other Ambulatory Visit: Payer: Self-pay | Admitting: General Practice

## 2020-01-11 MED ORDER — SERTRALINE HCL 50 MG PO TABS: ORAL_TABLET | ORAL | 0 refills | Status: DC

## 2020-01-12 ENCOUNTER — Telehealth (HOSPITAL_COMMUNITY): Payer: Self-pay

## 2020-01-12 ENCOUNTER — Other Ambulatory Visit: Payer: Self-pay

## 2020-01-12 ENCOUNTER — Telehealth: Payer: Self-pay | Admitting: Critical Care Medicine

## 2020-01-12 ENCOUNTER — Other Ambulatory Visit: Payer: Self-pay | Admitting: Critical Care Medicine

## 2020-01-12 DIAGNOSIS — Z853 Personal history of malignant neoplasm of breast: Secondary | ICD-10-CM

## 2020-01-12 DIAGNOSIS — R937 Abnormal findings on diagnostic imaging of other parts of musculoskeletal system: Secondary | ICD-10-CM

## 2020-01-12 NOTE — Telephone Encounter (Signed)
There was a separate encounter from Tracy [name] today where she has called and spoken with pt about the results of the CT abdomen/pelvis. Encounter will now be closed.

## 2020-01-12 NOTE — Telephone Encounter (Signed)
I called to let Tracy [name] know about her abdominal CT scan showing lytic lesions in L5 and her pelvis. PET scan, IR referral for biopsy ordered. I have messaged her Oncologist Dr. [name] to make him aware.  Julian Hy, DO 01/12/20 8:32 AM [facility]

## 2020-01-13 ENCOUNTER — Encounter: Payer: Self-pay | Admitting: Family Medicine

## 2020-01-13 ENCOUNTER — Telehealth: Payer: Self-pay

## 2020-01-13 ENCOUNTER — Ambulatory Visit (INDEPENDENT_AMBULATORY_CARE_PROVIDER_SITE_OTHER): Payer: Medicare Other | Admitting: Family Medicine

## 2020-01-13 ENCOUNTER — Other Ambulatory Visit: Payer: Self-pay

## 2020-01-13 ENCOUNTER — Telehealth: Payer: Self-pay | Admitting: Hematology and Oncology

## 2020-01-13 VITALS — BP 127/71 | HR 75 | Temp 98.0°F | Resp 16 | Ht 60.0 in | Wt 191.5 lb

## 2020-01-13 DIAGNOSIS — E785 Hyperlipidemia, unspecified: Secondary | ICD-10-CM

## 2020-01-13 DIAGNOSIS — D649 Anemia, unspecified: Secondary | ICD-10-CM

## 2020-01-13 DIAGNOSIS — E1140 Type 2 diabetes mellitus with diabetic neuropathy, unspecified: Secondary | ICD-10-CM

## 2020-01-13 DIAGNOSIS — I10 Essential (primary) hypertension: Secondary | ICD-10-CM | POA: Diagnosis not present

## 2020-01-13 LAB — LIPID PANEL
Cholesterol: 127 mg/dL (ref 0–200)
HDL: 44 mg/dL (ref >39.00)
LDL Cholesterol: 58 mg/dL (ref 0–99)
NonHDL: 83.46
Total CHOL/HDL Ratio: 3
Triglycerides: 129 mg/dL (ref 0.0–149.0)
VLDL: 25.8 mg/dL (ref 0.0–40.0)

## 2020-01-13 LAB — HEPATIC FUNCTION PANEL
ALT: 11 U/L (ref 0–35)
AST: 12 U/L (ref 0–37)
Albumin: 3.5 g/dL (ref 3.5–5.2)
Alkaline Phosphatase: 81 U/L (ref 39–117)
Bilirubin, Direct: 0.5 mg/dL — ABNORMAL HIGH (ref 0.0–0.3)
Total Bilirubin: 0.2 mg/dL (ref 0.2–1.2)
Total Protein: 10.2 g/dL — ABNORMAL HIGH (ref 6.0–8.3)

## 2020-01-13 LAB — CBC WITH DIFFERENTIAL/PLATELET
Basophils Absolute: 0 10*3/uL (ref 0.0–0.1)
Basophils Relative: 0.4 % (ref 0.0–3.0)
Eosinophils Absolute: 0.1 10*3/uL (ref 0.0–0.7)
Eosinophils Relative: 0.8 % (ref 0.0–5.0)
HCT: 23.2 % — CL (ref 36.0–46.0)
Hemoglobin: 7.9 g/dL — CL (ref 12.0–15.0)
Lymphocytes Relative: 43.5 % (ref 12.0–46.0)
Lymphs Abs: 2.8 10*3/uL (ref 0.7–4.0)
MCHC: 34 g/dL (ref 30.0–36.0)
MCV: 93.7 fl (ref 78.0–100.0)
Monocytes Absolute: 0.4 10*3/uL (ref 0.1–1.0)
Monocytes Relative: 6.4 % (ref 3.0–12.0)
Neutro Abs: 3.2 10*3/uL (ref 1.4–7.7)
Neutrophils Relative %: 48.9 % (ref 43.0–77.0)
Platelets: 272 10*3/uL (ref 150.0–400.0)
RBC: 2.48 Mil/uL — ABNORMAL LOW (ref 3.87–5.11)
RDW: 14.6 % (ref 11.5–15.5)
WBC: 6.4 10*3/uL (ref 4.0–10.5)

## 2020-01-13 LAB — BASIC METABOLIC PANEL
BUN: 17 mg/dL (ref 6–23)
CO2: 24 mEq/L (ref 19–32)
Calcium: 8.6 mg/dL (ref 8.4–10.5)
Chloride: 104 mEq/L (ref 96–112)
Creatinine, Ser: 1.31 mg/dL — ABNORMAL HIGH (ref 0.40–1.20)
GFR: 47.79 mL/min — ABNORMAL LOW (ref >60.00)
Glucose, Bld: 100 mg/dL — ABNORMAL HIGH (ref 70–99)
Potassium: 4.4 mEq/L (ref 3.5–5.1)
Sodium: 132 mEq/L — ABNORMAL LOW (ref 135–145)

## 2020-01-13 LAB — HEMOGLOBIN A1C: Hgb A1c MFr Bld: 7 % — ABNORMAL HIGH (ref 4.6–6.5)

## 2020-01-13 LAB — TSH: TSH: 1.68 u[IU]/mL (ref 0.35–4.50)

## 2020-01-13 NOTE — Telephone Encounter (Signed)
Pt notified, states she is taking Iron 325mg  OTC as insurance would not pay for the Rx. IFOB ordered and placed in mail for pt.

## 2020-01-13 NOTE — Telephone Encounter (Signed)
Will route note to Oncology as official CBC results have not come in.  Will route those also when they return.

## 2020-01-13 NOTE — Telephone Encounter (Signed)
Please make sure pt is taking her daily iron supplement (last filled 2019) as she has dropped 1.5 grams since Feb.  She has upcoming appt w/ oncology but will send results to Dr [name] to make him aware.  Also, needs repeat iFOB (one in Feb was negative but it has been 4 months)

## 2020-01-13 NOTE — Telephone Encounter (Signed)
Scheduled appt per 6/16 sch message - pt is aware of appt date and time   

## 2020-01-13 NOTE — Patient Instructions (Addendum)
Follow up in 3 months to recheck diabetes We'll notify you of your lab results and make any changes if needed I am proud of your healthy changes We are thinking of you and wishing you the best with your upcoming testing and procedures Call with any questions or concerns Hang in there!

## 2020-01-13 NOTE — Progress Notes (Signed)
° °  Subjective:    Patient ID: Tracy [name], female    DOB: Oct 24, 1945, 74 y.o.   MRN: [id number]  HPI DM- chronic problem, was started on Metformin due to elevated A1C 8.4.  Stopped her Metformin when she had her CT scan.  UTD on eye exam, foot exam, and on Losartan for renal protection.  Not checking sugars regularly.  Denies symptomatic lows.  HTN- chronic problem, on Losartan 100mg  daily w/ good control.  + SOB- seeing pulmonary.  Continues to have intermittent CP- doesn't want to see Cards b/c EKG at pulmonary was normal.  'I don't think it has anything to do w/ my heart'.  Hyperlipidemia- chronic problem, on Lipitor 10mg  daily.  Denies abd pain.  + nausea 'for awhile' but this resolved after the contrast 'cleaned me out'    Pt is very upset today about her missed pulmonary appt.  She reports that someone from this office misled her and gave her the wrong address.  By the time she got to the office, she had missed her appt.  She was told that Dr Carlis Abbott was a 'spiritual doctor' so she was hesitant to see her, but she has been very pleased with her efforts.  She has a lot of frustration and anger at our office for the misdirection.     Review of Systems For ROS see HPI   This visit occurred during the SARS-CoV-2 public health emergency.  Safety protocols were in place, including screening questions prior to the visit, additional usage of staff PPE, and extensive cleaning of exam room while observing appropriate contact time as indicated for disinfecting solutions.       Objective:   Physical Exam Vitals reviewed.  Constitutional:      General: She is not in acute distress.    Appearance: She is well-developed. She is obese.  HENT:     Head: Normocephalic and atraumatic.  Eyes:     Conjunctiva/sclera: Conjunctivae normal.     Pupils: Pupils are equal, round, and reactive to light.  Neck:     Thyroid: No thyromegaly.  Cardiovascular:     Rate and Rhythm: Normal rate and regular  rhythm.     Heart sounds: Normal heart sounds. No murmur heard.   Pulmonary:     Effort: Pulmonary effort is normal. No respiratory distress.     Breath sounds: Normal breath sounds.  Abdominal:     General: There is no distension.     Palpations: Abdomen is soft.     Tenderness: There is no abdominal tenderness.  Musculoskeletal:     Cervical back: Normal range of motion and neck supple.  Lymphadenopathy:     Cervical: No cervical adenopathy.  Skin:    General: Skin is warm and dry.  Neurological:     Mental Status: She is alert and oriented to person, place, and time.  Psychiatric:        Behavior: Behavior normal.           Assessment & Plan:

## 2020-01-13 NOTE — Addendum Note (Signed)
Addended by: Davis Gourd on: 01/13/2020 04:24 PM   Modules accepted: Orders

## 2020-01-13 NOTE — Telephone Encounter (Signed)
Critical labs  Hemoglobin 7.9 Hematocrit 23.2

## 2020-01-14 ENCOUNTER — Other Ambulatory Visit: Payer: Self-pay | Admitting: Radiology

## 2020-01-14 ENCOUNTER — Other Ambulatory Visit: Payer: Self-pay | Admitting: Family Medicine

## 2020-01-14 ENCOUNTER — Ambulatory Visit (INDEPENDENT_AMBULATORY_CARE_PROVIDER_SITE_OTHER): Payer: Medicare Other | Admitting: Critical Care Medicine

## 2020-01-14 DIAGNOSIS — R05 Cough: Secondary | ICD-10-CM | POA: Diagnosis not present

## 2020-01-14 DIAGNOSIS — R059 Cough, unspecified: Secondary | ICD-10-CM

## 2020-01-14 DIAGNOSIS — R7982 Elevated C-reactive protein (CRP): Secondary | ICD-10-CM

## 2020-01-14 LAB — PULMONARY FUNCTION TEST
DL/VA % pred: 89 %
DL/VA: 3.8 ml/min/mmHg/L
DLCO cor % pred: 77 %
DLCO cor: 12.83 ml/min/mmHg
DLCO unc % pred: 60 %
DLCO unc: 9.97 ml/min/mmHg
FEF 25-75 Post: 1.19 L/sec
FEF 25-75 Pre: 0.94 L/sec
FEF2575-%Change-Post: 27 %
FEF2575-%Pred-Post: 97 %
FEF2575-%Pred-Pre: 76 %
FEV1-%Change-Post: 5 %
FEV1-%Pred-Post: 97 %
FEV1-%Pred-Pre: 92 %
FEV1-Post: 1.32 L
FEV1-Pre: 1.25 L
FEV1FVC-%Change-Post: 3 %
FEV1FVC-%Pred-Pre: 98 %
FEV6-%Change-Post: 1 %
FEV6-%Pred-Post: 100 %
FEV6-%Pred-Pre: 98 %
FEV6-Post: 1.69 L
FEV6-Pre: 1.66 L
FEV6FVC-%Pred-Post: 105 %
FEV6FVC-%Pred-Pre: 105 %
FVC-%Change-Post: 1 %
FVC-%Pred-Post: 95 %
FVC-%Pred-Pre: 93 %
FVC-Post: 1.69 L
FVC-Pre: 1.66 L
Post FEV1/FVC ratio: 78 %
Post FEV6/FVC ratio: 100 %
Pre FEV1/FVC ratio: 75 %
Pre FEV6/FVC Ratio: 100 %
RV % pred: 115 %
RV: 2.41 L
TLC % pred: 91 %
TLC: 4.1 L

## 2020-01-14 NOTE — Progress Notes (Signed)
Full PFT performed today. °

## 2020-01-17 ENCOUNTER — Ambulatory Visit (HOSPITAL_COMMUNITY)
Admission: RE | Admit: 2020-01-17 | Discharge: 2020-01-17 | Disposition: A | Discharge status: Home / Self Care | Payer: Medicare Other | Source: Ambulatory Visit | Attending: Critical Care Medicine | Admitting: Critical Care Medicine

## 2020-01-17 ENCOUNTER — Other Ambulatory Visit: Payer: Self-pay

## 2020-01-17 ENCOUNTER — Other Ambulatory Visit (HOSPITAL_COMMUNITY): Payer: Self-pay | Admitting: Critical Care Medicine

## 2020-01-17 ENCOUNTER — Encounter (HOSPITAL_COMMUNITY): Payer: Self-pay

## 2020-01-17 DIAGNOSIS — D4989 Neoplasm of unspecified behavior of other specified sites: Secondary | ICD-10-CM | POA: Diagnosis not present

## 2020-01-17 DIAGNOSIS — R937 Abnormal findings on diagnostic imaging of other parts of musculoskeletal system: Secondary | ICD-10-CM

## 2020-01-17 DIAGNOSIS — M899 Disorder of bone, unspecified: Secondary | ICD-10-CM

## 2020-01-17 DIAGNOSIS — Z79899 Other long term (current) drug therapy: Secondary | ICD-10-CM | POA: Diagnosis not present

## 2020-01-17 DIAGNOSIS — D6489 Other specified anemias: Secondary | ICD-10-CM | POA: Diagnosis not present

## 2020-01-17 DIAGNOSIS — I10 Essential (primary) hypertension: Secondary | ICD-10-CM | POA: Diagnosis not present

## 2020-01-17 DIAGNOSIS — C9000 Multiple myeloma not having achieved remission: Secondary | ICD-10-CM | POA: Diagnosis not present

## 2020-01-17 DIAGNOSIS — Z7984 Long term (current) use of oral hypoglycemic drugs: Secondary | ICD-10-CM | POA: Diagnosis not present

## 2020-01-17 DIAGNOSIS — Z7982 Long term (current) use of aspirin: Secondary | ICD-10-CM | POA: Diagnosis not present

## 2020-01-17 DIAGNOSIS — K219 Gastro-esophageal reflux disease without esophagitis: Secondary | ICD-10-CM | POA: Diagnosis not present

## 2020-01-17 DIAGNOSIS — J42 Unspecified chronic bronchitis: Secondary | ICD-10-CM | POA: Insufficient documentation

## 2020-01-17 DIAGNOSIS — F419 Anxiety disorder, unspecified: Secondary | ICD-10-CM | POA: Diagnosis not present

## 2020-01-17 DIAGNOSIS — Z8673 Personal history of transient ischemic attack (TIA), and cerebral infarction without residual deficits: Secondary | ICD-10-CM | POA: Insufficient documentation

## 2020-01-17 DIAGNOSIS — Z853 Personal history of malignant neoplasm of breast: Secondary | ICD-10-CM | POA: Diagnosis not present

## 2020-01-17 DIAGNOSIS — I252 Old myocardial infarction: Secondary | ICD-10-CM | POA: Insufficient documentation

## 2020-01-17 DIAGNOSIS — D649 Anemia, unspecified: Secondary | ICD-10-CM | POA: Diagnosis not present

## 2020-01-17 DIAGNOSIS — D492 Neoplasm of unspecified behavior of bone, soft tissue, and skin: Secondary | ICD-10-CM | POA: Diagnosis not present

## 2020-01-17 LAB — CBC WITH DIFFERENTIAL/PLATELET
Abs Immature Granulocytes: 0.02 10*3/uL (ref 0.00–0.07)
Basophils Absolute: 0 10*3/uL (ref 0.0–0.1)
Basophils Relative: 0 %
Eosinophils Absolute: 0.1 10*3/uL (ref 0.0–0.5)
Eosinophils Relative: 1 %
HCT: 26.4 % — ABNORMAL LOW (ref 36.0–46.0)
Hemoglobin: 8.3 g/dL — ABNORMAL LOW (ref 12.0–15.0)
Immature Granulocytes: 0 %
Lymphocytes Relative: 41 %
Lymphs Abs: 3.1 10*3/uL (ref 0.7–4.0)
MCH: 30.6 pg (ref 26.0–34.0)
MCHC: 31.4 g/dL (ref 30.0–36.0)
MCV: 97.4 fL (ref 80.0–100.0)
Monocytes Absolute: 0.4 10*3/uL (ref 0.1–1.0)
Monocytes Relative: 6 %
Neutro Abs: 3.9 10*3/uL (ref 1.7–7.7)
Neutrophils Relative %: 52 %
Platelets: 306 10*3/uL (ref 150–400)
RBC: 2.71 MIL/uL — ABNORMAL LOW (ref 3.87–5.11)
RDW: 14.6 % (ref 11.5–15.5)
WBC: 7.6 10*3/uL (ref 4.0–10.5)
nRBC: 0 % (ref 0.0–0.2)

## 2020-01-17 LAB — GLUCOSE, CAPILLARY: Glucose-Capillary: 120 mg/dL — ABNORMAL HIGH (ref 70–99)

## 2020-01-17 MED ORDER — FENTANYL CITRATE (PF) 100 MCG/2ML IJ SOLN
INTRAMUSCULAR | Status: AC | PRN
  Administered 2020-01-17 (×2): 50 ug via INTRAVENOUS

## 2020-01-17 MED ORDER — HYDROCODONE-ACETAMINOPHEN 5-325 MG PO TABS: 1.0000 | ORAL_TABLET | ORAL | Status: DC | PRN

## 2020-01-17 MED ORDER — FENTANYL CITRATE (PF) 100 MCG/2ML IJ SOLN
INTRAMUSCULAR | Status: AC
  Filled 2020-01-17: qty 2

## 2020-01-17 MED ORDER — LIDOCAINE HCL (PF) 1 % IJ SOLN
INTRAMUSCULAR | Status: AC | PRN
  Administered 2020-01-17 (×2): 10 mL

## 2020-01-17 MED ORDER — MIDAZOLAM HCL 2 MG/2ML IJ SOLN
INTRAMUSCULAR | Status: AC | PRN
  Administered 2020-01-17 (×4): 1 mg via INTRAVENOUS

## 2020-01-17 MED ORDER — SODIUM CHLORIDE 0.9 % IV SOLN: INTRAVENOUS | Status: DC

## 2020-01-17 MED ORDER — MIDAZOLAM HCL 2 MG/2ML IJ SOLN
INTRAMUSCULAR | Status: AC
  Filled 2020-01-17: qty 4

## 2020-01-17 NOTE — Procedures (Signed)
Interventional Radiology Procedure:   Indications: Lytic bone lesions, history of breast cancer  Procedure: CT guided bone marrow biopsy and CT guided biopsy of left pelvic bone lesion  Findings: 2 aspirates and 2 cores from right ilium.  Soft tissue core biopsies from left pelvic bone lesion.  Complications: None     EBL: Minimal, less than 10 ml  Plan: Discharge to home in one hour.   Adam R. Henn, MD  Pager: [phone]    

## 2020-01-17 NOTE — Discharge Instructions (Signed)
Please call Interventional Radiology clinic [phone] with any questions or concerns.  You may remove your dressing and shower tomorrow.   Bone Marrow Aspiration and Bone Marrow Biopsy, Adult, Care After This sheet gives you information about how to care for yourself after your procedure. Your health care provider may also give you more specific instructions. If you have problems or questions, contact your health care provider. What can I expect after the procedure? After the procedure, it is common to have:  Mild pain and tenderness.  Swelling.  Bruising. Follow these instructions at home: Puncture site care   Follow instructions from your health care provider about how to take care of the puncture site. Make sure you: ? Wash your hands with soap and water before and after you change your bandage (dressing). If soap and water are not available, use hand sanitizer. ? Change your dressing as told by your health care provider.  Check your puncture site every day for signs of infection. Check for: ? More redness, swelling, or pain. ? Fluid or blood. ? Warmth. ? Pus or a bad smell. Activity  Return to your normal activities as told by your health care provider. Ask your health care provider what activities are safe for you.  Do not lift anything that is heavier than 10 lb (4.5 kg), or the limit that you are told, until your health care provider says that it is safe.  Do not drive for 24 hours if you were given a sedative during your procedure. General instructions   Take over-the-counter and prescription medicines only as told by your health care provider.  Do not take baths, swim, or use a hot tub until your health care provider approves. Ask your health care provider if you may take showers. You may only be allowed to take sponge baths.  If directed, put ice on the affected area. To do this: ? Put ice in a plastic bag. ? Place a towel between your skin and the  bag. ? Leave the ice on for 20 minutes, 2-3 times a day.  Keep all follow-up visits as told by your health care provider. This is important. Contact a health care provider if:  Your pain is not controlled with medicine.  You have a fever.  You have more redness, swelling, or pain around the puncture site.  You have fluid or blood coming from the puncture site.  Your puncture site feels warm to the touch.  You have pus or a bad smell coming from the puncture site. Summary  After the procedure, it is common to have mild pain, tenderness, swelling, and bruising.  Follow instructions from your health care provider about how to take care of the puncture site and what activities are safe for you.  Take over-the-counter and prescription medicines only as told by your health care provider.  Contact a health care provider if you have any signs of infection, such as fluid or blood coming from the puncture site. This information is not intended to replace advice given to you by your health care provider. Make sure you discuss any questions you have with your health care provider. Document Revised: 12/01/2018 Document Reviewed: 12/01/2018 Elsevier Patient Education  2020 Elsevier Inc[employer]   Moderate Conscious Sedation, Adult, Care After These instructions provide you with information about caring for yourself after your procedure. Your health care provider may also give you more specific instructions. Your treatment has been planned according to current medical practices, but problems sometimes occur. Call   your health care provider if you have any problems or questions after your procedure. What can I expect after the procedure? After your procedure, it is common:  To feel sleepy for several hours.  To feel clumsy and have poor balance for several hours.  To have poor judgment for several hours.  To vomit if you eat too soon. Follow these instructions at home: For at least 24 hours after  the procedure:   Do not: ? Participate in activities where you could fall or become injured. ? Drive. ? Use heavy machinery. ? Drink alcohol. ? Take sleeping pills or medicines that cause drowsiness. ? Make important decisions or sign legal documents. ? Take care of children on your own.  Rest. Eating and drinking  Follow the diet recommended by your health care provider.  If you vomit: ? Drink water, juice, or soup when you can drink without vomiting. ? Make sure you have little or no nausea before eating solid foods. General instructions  Have a responsible adult stay with you until you are awake and alert.  Take over-the-counter and prescription medicines only as told by your health care provider.  If you smoke, do not smoke without supervision.  Keep all follow-up visits as told by your health care provider. This is important. Contact a health care provider if:  You keep feeling nauseous or you keep vomiting.  You feel light-headed.  You develop a rash.  You have a fever. Get help right away if:  You have trouble breathing. This information is not intended to replace advice given to you by your health care provider. Make sure you discuss any questions you have with your health care provider. Document Revised: 06/27/2017 Document Reviewed: 11/04/2015 Elsevier Patient Education  2020 Else[employer] Inc.  

## 2020-01-17 NOTE — H&P (Signed)
Chief Complaint: Patient was seen in consultation today for CT-guided bone marrow aspiration and biopsy; bone lesion biopsy.  Referring Physician(s): Clark,Laura P  Supervising Physician: Markus Daft  Patient Status: [facility] - Out-pt  History of Present Illness: Tracy [name] is a 74 y.o. female with a medical history that includes stroke, MI, HTN, chronic bronchitis, anxiety and bilateral breast cancer (2000 and 2016; lumpectomies and radiation). She was seen by her pulmonologist 12/30/2019 for increased cough, sputum product and worsening GERD. Lab work up revealed anemia and a CT abdomen pelvis was done.   CT abdomen pelvis 01/07/20:  1. No explanation for patient's symptoms. No acute process in the abdomen or pelvis. 2. Development of lytic lesions within the pelvis and L5 vertebral body. Given history of primary malignancy, most likely metastatic disease. Multiple myeloma could look similar.  Interventional Radiology has been asked to evaluate this patient for a CT-guided bone marrow aspiration and biopsy as well as a bone lesion biopsy.   Past Medical History:  Diagnosis Date   Anxiety    Arthritis    Breast cancer ([place]) 06/13/15   Cancer ([place]) 2000   breast cancer   Chronic bronchitis ([place])    Chronic bronchitis ([place])    Hyperlipidemia    Hypertension    Myocardial infarction [facility]) 2001   Personal history of radiation therapy    Restless leg    Stroke ([place]) 2004   TIA, no deficits    Past Surgical History:  Procedure Laterality Date   ABDOMINAL HYSTERECTOMY  1985   BREAST LUMPECTOMY Left 2000   radiation and chemo   BREAST LUMPECTOMY Right 2016   radiation   BREAST SURGERY  2001   lt breast lumpectomy   RADIOACTIVE SEED GUIDED PARTIAL MASTECTOMY WITH AXILLARY SENTINEL LYMPH NODE BIOPSY Right 07/07/2015   Procedure: RIGHT RADIOACTIVE SEED GUIDED PARTIAL MASTECTOMY WITH AXILLARY SENTINEL LYMPH NODE BIOPSY;  Surgeon: Autumn Messing III, MD;  Location:  [facility];  Service: General;  Laterality: Right;   SMALL INTESTINE SURGERY     TUBAL LIGATION      Allergies: Bacitracin-neomycin-polymyxin  [neomycin-bacitracin zn-polymyx], Nsaids, Tape, Ambien [zolpidem tartrate], Amoxicillin, Contrast media [iodinated diagnostic agents], Latex, and Prednisone  Medications: Prior to Admission medications   Medication Sig Start Date End Date Taking? Authorizing Provider  acetaminophen-codeine (TYLENOL #3) 300-30 MG tablet Take by mouth every 4 (four) hours as needed for moderate pain.    [provider]  albuterol (PROAIR HFA) 108 (90 Base) MCG/ACT inhaler Inhale 2 puffs into the lungs every 4 (four) hours as needed for wheezing. 06/03/18   [name], MD  Ascorbic Acid (VITAMIN C PO) Take by mouth.    [provider]  aspirin 81 MG tablet Take 81 mg by mouth daily.    [provider]  atorvastatin (LIPITOR) 10 MG tablet TAKE 1 TABLET BY MOUTH  DAILY 05/12/19   [name], MD  ferrous sulfate 325 (65 FE) MG tablet Take 1 tablet (325 mg total) by mouth daily with breakfast. 01/20/18   [name], MD  gabapentin (NEURONTIN) 300 MG capsule Take 1 capsule (300 mg total) by mouth 3 (three) times daily. 11/03/19   [name], MD  loratadine (CLARITIN) 10 MG tablet Take 10 mg by mouth daily.    [provider]  losartan (COZAAR) 100 MG tablet TAKE 1 TABLET BY MOUTH  DAILY 05/12/19   [name], MD  meclizine (ANTIVERT) 25 MG tablet TAKE  1 TABLET(25 MG) BY MOUTH THREE TIMES DAILY AS NEEDED FOR DIZZINESS 12/28/18   [name], MD  MELATONIN PO Take by mouth.    [provider]  metFORMIN (GLUCOPHAGE) 500 MG tablet Take 1 tablet (500 mg total) by mouth 2 (two) times daily with a meal. 11/22/19   [name], MD  mometasone (NASONEX) 50 MCG/ACT nasal spray Place 2 sprays into the nose daily. 10/12/18   [name], MD  Multiple  Vitamins-Minerals (ZINC PO) Take by mouth.    [provider]  pantoprazole (PROTONIX) 40 MG tablet Take 1 tablet (40 mg total) by mouth 2 (two) times daily. 11/03/19   [name], MD  sertraline (ZOLOFT) 50 MG tablet TAKE 1 TABLET(50 MG) BY MOUTH DAILY 01/11/20   [name], MD  tiZANidine (ZANAFLEX) 4 MG tablet Take 1 tablet (4 mg total) by mouth at bedtime. 11/26/19   [name], MD  traZODone (DESYREL) 100 MG tablet TAKE 1 TABLET BY MOUTH AT  BEDTIME 12/09/19   [name], MD     Family History  Problem Relation Age of Onset   Diabetes Father    Lung cancer Sister        dx. <50; former smoker   Diabetes Brother    Diabetes Paternal Aunt    Stroke Maternal Grandmother    Diabetes Paternal Grandmother    Emphysema Mother 23       smoker   Diabetes Brother    Brain cancer Brother 32       unknown tumor type   Cancer Daughter 85       neck cancer   Other Daughter        hysterectomy for unspecified reason   Breast cancer Cousin    Cancer Cousin        unspecified type   Breast cancer Other        triple negative breast cancer in her 30s   Cancer Daughter    Colon polyps Neg Hx    Esophageal cancer Neg Hx    Gallbladder disease Neg Hx     Social History   Socioeconomic History   Marital status: Divorced    Spouse name: Not on file   Number of children: 7   Years of education: Not on file   Highest education level: Not on file  Occupational History   Occupation: retired  Tobacco Use   Smoking status: Former Smoker    Packs/day: 1.00    Years: 20.00    Pack years: 20.00    Types: Cigarettes    Quit date: 07/30/2011    Years since quitting: 8.4   Smokeless tobacco: Never Used   Tobacco comment: Quit >4 years ago; 1 ppd for about 5/20 years (remaining was less)  Vaping Use   Vaping Use: Former  Substance and Sexual Activity   Alcohol use: No    Alcohol/week: 0.0 standard drinks   Drug use: No     Sexual activity: Not on file  Other Topics Concern   Not on file  Social History Narrative   Lives alone.  Retired.  Education:  11th grade GED.  Children:  7 (one here).    Social Determinants of Health   Financial Resource Strain:    Difficulty of Paying Living Expenses:   Food Insecurity: No Food Insecurity   Worried About Running Out of Food in the Last Year: Never true   Ran Out of Food in the  Last Year: Never true  Transportation Needs: No Transportation Needs   Lack of Transportation (Medical): No   Lack of Transportation (Non-Medical): No  Physical Activity:    Days of Exercise per Week:    Minutes of Exercise per Session:   Stress:    Feeling of Stress :   Social Connections:    Frequency of Communication with Friends and Family:    Frequency of Social Gatherings with Friends and Family:    Attends Religious Services:    Active Member of Clubs or Organizations:    Attends Archivist Meetings:    Marital Status:     Review of Systems: A 12 point ROS discussed and pertinent positives are indicated in the HPI above.  All other systems are negative.  Review of Systems  Constitutional: Positive for activity change, appetite change and fatigue.  Respiratory: Negative for cough and shortness of breath.   Cardiovascular: Negative for chest pain and leg swelling.  Gastrointestinal: Positive for nausea. Negative for abdominal distention, abdominal pain, diarrhea and vomiting.  Musculoskeletal: Positive for back pain.       Lower back/hip pain   Neurological: Negative for headaches.  Hematological: Does not bruise/bleed easily.    Vital Signs: BP (!) (P) 185/73    Pulse 67    Temp 98.2 F (36.8 C) (Oral)    Resp 16    Ht 5' (1.524 m)    Wt 191 lb (86.6 kg)    SpO2 99%    BMI 37.30 kg/m   Physical Exam Constitutional:      General: She is not in acute distress.    Appearance: She is obese.  HENT:     Mouth/Throat:     Mouth: Mucous  membranes are moist.  Cardiovascular:     Rate and Rhythm: Normal rate and regular rhythm.     Pulses: Normal pulses.     Heart sounds: Normal heart sounds.  Pulmonary:     Effort: Pulmonary effort is normal.     Breath sounds: Normal breath sounds.  Abdominal:     General: Bowel sounds are normal. There is no distension.     Palpations: Abdomen is soft.     Tenderness: There is no abdominal tenderness.  Musculoskeletal:        General: Normal range of motion.  Skin:    General: Skin is warm and dry.  Neurological:     Mental Status: She is alert and oriented to person, place, and time.     Imaging: CT ABDOMEN PELVIS WO CONTRAST  Result Date: 01/07/2020 CLINICAL DATA:  Cough. Gastroesophageal reflux disease. Poorly controlled acid reflux. Bilateral lumpectomy. Hysterectomy. Left breast cancer in 2000 with radiation therapy and chemotherapy completed. EXAM: CT ABDOMEN AND PELVIS WITHOUT CONTRAST TECHNIQUE: Multidetector CT imaging of the abdomen and pelvis was performed following the standard protocol without IV contrast. COMPARISON:  09/01/2014 FINDINGS: Lower chest: Clear lung bases. Normal heart size without pericardial or pleural effusion. Left mastectomy. Hepatobiliary: Mild hepatic steatosis. Normal gallbladder, without biliary ductal dilatation. Pancreas: Normal, without mass or ductal dilatation. Spleen: Normal in size, without focal abnormality. Adrenals/Urinary Tract: Normal adrenal glands. No renal calculi or hydronephrosis. No hydroureter or ureteric calculi. No bladder calculi. Stomach/Bowel: Proximal gastric underdistention. Scattered colonic diverticula. Normal terminal ileum and appendix. Normal small bowel. Vascular/Lymphatic: Aortic atherosclerosis. No abdominopelvic adenopathy. Reproductive: Hysterectomy.  No adnexal mass. Other: No significant free fluid.  Mild pelvic floor laxity. Musculoskeletal: Osteopenia. Dominant new lytic lesion within the left posterior  acetabulum,  including at 4.5 cm on 75/2. New lytic lesion within the L5 vertebral body, including on sagittal image 64 and coronal image 77. IMPRESSION: 1. No explanation for patient's symptoms. No acute process in the abdomen or pelvis. 2. Development of lytic lesions within the pelvis and L5 vertebral body. Given history of primary malignancy, most likely metastatic disease. Multiple myeloma could look similar. 3. Hepatic steatosis. 4.  Aortic Atherosclerosis (ICD10-I70.0). These results will be called to the ordering clinician or representative by the Radiologist Assistant, and communication documented in the PACS or [employer]. Electronically Signed   By: Abigail Miyamoto M.D.   On: 01/07/2020 10:02   DG ESOPHAGUS W DOUBLE CM (HD)  Result Date: 01/07/2020 CLINICAL DATA:  Gastroesophageal reflux disease. Shortness of breath. Reflux. Nausea. Dysphagia. EXAM: ESOPHOGRAM / BARIUM SWALLOW / BARIUM TABLET STUDY TECHNIQUE: Combined double contrast and single contrast examination performed using effervescent crystals, thick barium liquid, and thin barium liquid. The patient was observed with fluoroscopy swallowing a 13 mm barium sulphate tablet. FLUOROSCOPY TIME:  Fluoroscopy Time:  2 minutes and 22 seconds Radiation Exposure Index (if provided by the fluoroscopic device): Not applicable. Number of Acquired Spot Images: 0 COMPARISON:  today's CT, dictated separately. Chest radiograph 10/12/2018 FINDINGS: Hypopharyngeal portion of the exam is unremarkable. Double contrast evaluation of the esophagus demonstrates no mucosal abnormality. Evaluation of esophageal motility demonstrates proximal escape waves in the distal esophagus with contrast stasis in the mid and lower esophagus. Full column evaluation of the esophagus demonstrates no persistent narrowing or stricture. A 13 mm barium tablet passes promptly. IMPRESSION: 1. Esophageal dysmotility, likely presbyesophagus. 2. No evidence of esophageal stricture or anatomic  explanation for patient's symptoms. Electronically Signed   By: Abigail Miyamoto M.D.   On: 01/07/2020 10:08    Labs:  CBC: Recent Labs    08/20/19 1044 01/13/20 1352 01/17/20 0805  WBC 7.2 6.4 7.6  HGB 9.3* 7.9 Repeated and verified X2.* 8.3*  HCT 28.0* 23.2 Repeated and verified X2.* 26.4*  PLT 247.0 272.0 306    COAGS: No results for input(s): INR, APTT in the last 8760 hours.  BMP: Recent Labs    05/20/19 1422 08/20/19 1044 09/13/19 1323 01/13/20 1352  NA 136 137 133* 132*  K 4.4 4.2 4.1 4.4  CL 105 105 103 104  CO2 '25 24 24 24  ' GLUCOSE 106* 149* 207* 100*  BUN '13 17 16 17  ' CALCIUM 9.2 8.7 8.9 8.6  CREATININE 1.05 1.00 1.07 1.31*    LIVER FUNCTION TESTS: Recent Labs    08/20/19 1044 01/13/20 1352  BILITOT 0.2 0.2  AST 16 12  ALT 13 11  ALKPHOS 76 81  PROT 8.8* 10.2*  ALBUMIN 3.6 3.5    TUMOR MARKERS: No results for input(s): AFPTM, CEA, CA199, CHROMGRNA in the last 8760 hours.  Assessment and Plan:  Anemia and pelvic bone lesions: Tracy [name], 74 year old female, presents today to the [facility] department for a CT-guided bone marrow aspiration and biopsy and a bone lesion biopsy. This case was discussed with both Dr. [name] and Dr. Anselm Pancoast.   Risks and benefits of a bone marrow aspiration and biopsy and bone lesion biopsy were discussed with the patient including, but not limited to bleeding, infection, damage to adjacent structures or low yield requiring additional tests.  All of the questions were answered and there is agreement to proceed.  Labs and vitals have been reviewed and are within acceptable limits. The patient has been  NPO.   Consent signed and in chart.    Thank you for this interesting consult.  I greatly enjoyed meeting Tracy [name] and look forward to participating in their care.  A copy of this report was sent to the requesting provider on this date.  Electronically Signed: Theresa Duty,  NP 01/17/2020, 8:56 AM   I spent a total of 30 Minutes   in face to face in clinical consultation, greater than 50% of which was counseling/coordinating care for CT-guided bone marrow aspiration and biopsy; bone lesion biopsy.

## 2020-01-18 NOTE — Assessment & Plan Note (Signed)
Chronic problem, tolerating statin w/o difficulty.  Check labs.  Adjust meds prn  

## 2020-01-18 NOTE — Assessment & Plan Note (Signed)
Chronic problem.  Previously on Metformin but stopped recently due to CT scan.  She would like to stop this if possible.  UTD on eye exam, foot exam, and is on ARB for renal protection.  Check labs.  Adjust meds prn

## 2020-01-18 NOTE — Assessment & Plan Note (Signed)
Chronic problem.  Well controlled.  Continues to have intermittent CP but is refusing cardiology referral.  Will continue to follow.

## 2020-01-19 ENCOUNTER — Ambulatory Visit (INDEPENDENT_AMBULATORY_CARE_PROVIDER_SITE_OTHER): Payer: Medicare Other

## 2020-01-19 ENCOUNTER — Other Ambulatory Visit: Payer: Self-pay

## 2020-01-19 DIAGNOSIS — R7982 Elevated C-reactive protein (CRP): Secondary | ICD-10-CM | POA: Diagnosis not present

## 2020-01-19 LAB — BASIC METABOLIC PANEL
BUN: 16 mg/dL (ref 6–23)
CO2: 25 mEq/L (ref 19–32)
Calcium: 8.7 mg/dL (ref 8.4–10.5)
Chloride: 101 mEq/L (ref 96–112)
Creatinine, Ser: 1.19 mg/dL (ref 0.40–1.20)
GFR: 53.39 mL/min — ABNORMAL LOW (ref >60.00)
Glucose, Bld: 146 mg/dL — ABNORMAL HIGH (ref 70–99)
Potassium: 4 mEq/L (ref 3.5–5.1)
Sodium: 131 mEq/L — ABNORMAL LOW (ref 135–145)

## 2020-01-19 LAB — SURGICAL PATHOLOGY

## 2020-01-20 ENCOUNTER — Ambulatory Visit (HOSPITAL_COMMUNITY)
Admission: RE | Admit: 2020-01-20 | Discharge: 2020-01-20 | Disposition: A | Discharge status: Home / Self Care | Payer: Medicare Other | Source: Ambulatory Visit | Attending: Critical Care Medicine | Admitting: Critical Care Medicine

## 2020-01-20 DIAGNOSIS — I700 Atherosclerosis of aorta: Secondary | ICD-10-CM | POA: Insufficient documentation

## 2020-01-20 DIAGNOSIS — C801 Malignant (primary) neoplasm, unspecified: Secondary | ICD-10-CM | POA: Diagnosis not present

## 2020-01-20 DIAGNOSIS — J439 Emphysema, unspecified: Secondary | ICD-10-CM | POA: Insufficient documentation

## 2020-01-20 DIAGNOSIS — Z853 Personal history of malignant neoplasm of breast: Secondary | ICD-10-CM | POA: Diagnosis not present

## 2020-01-20 DIAGNOSIS — R937 Abnormal findings on diagnostic imaging of other parts of musculoskeletal system: Secondary | ICD-10-CM | POA: Insufficient documentation

## 2020-01-20 DIAGNOSIS — K760 Fatty (change of) liver, not elsewhere classified: Secondary | ICD-10-CM | POA: Insufficient documentation

## 2020-01-20 DIAGNOSIS — M899 Disorder of bone, unspecified: Secondary | ICD-10-CM | POA: Diagnosis not present

## 2020-01-20 LAB — GLUCOSE, CAPILLARY: Glucose-Capillary: 121 mg/dL — ABNORMAL HIGH (ref 70–99)

## 2020-01-20 MED ORDER — FLUDEOXYGLUCOSE F - 18 (FDG) INJECTION
9.5500 | Freq: Once | INTRAVENOUS | Status: AC | PRN
  Administered 2020-01-20: 9.55 via INTRAVENOUS

## 2020-01-21 ENCOUNTER — Other Ambulatory Visit: Payer: Medicare Other

## 2020-01-21 ENCOUNTER — Telehealth: Payer: Self-pay | Admitting: Critical Care Medicine

## 2020-01-21 ENCOUNTER — Other Ambulatory Visit (INDEPENDENT_AMBULATORY_CARE_PROVIDER_SITE_OTHER): Payer: Medicare Other

## 2020-01-21 DIAGNOSIS — D649 Anemia, unspecified: Secondary | ICD-10-CM | POA: Diagnosis not present

## 2020-01-21 NOTE — Telephone Encounter (Signed)
I attempted to call Tracy [name] to discuss her PET scan results and biopsy results. Left a message for her to call back.  Julian Hy, DO 01/21/20 2:51 PM [facility]

## 2020-01-24 ENCOUNTER — Telehealth: Payer: Medicare Other

## 2020-01-24 ENCOUNTER — Other Ambulatory Visit: Payer: Self-pay | Admitting: General Practice

## 2020-01-24 ENCOUNTER — Other Ambulatory Visit: Payer: Self-pay | Admitting: *Deleted

## 2020-01-24 DIAGNOSIS — D649 Anemia, unspecified: Secondary | ICD-10-CM

## 2020-01-24 DIAGNOSIS — Z853 Personal history of malignant neoplasm of breast: Secondary | ICD-10-CM

## 2020-01-24 LAB — FECAL OCCULT BLOOD, IMMUNOCHEMICAL: Fecal Occult Bld: POSITIVE — AB

## 2020-01-24 NOTE — Telephone Encounter (Signed)
Spoke with patient and let her know that Dr. Carlis Abbott was calling to discuss her PET scan results and biopsy results.  Patient verbalized understanding.  I let her know that I would let  Dr. Carlis Abbott know that she returned her call.

## 2020-01-24 NOTE — Progress Notes (Unsigned)
[facility]  Name: Tracy [name]  MRN: [id number] DOB: 14-Apr-1946  Chief Complaint/ HPI  Tracy [name],  74 y.o. , female presents for their Initial CCM visit with the clinical pharmacist via telephone due to COVID-19 Pandemic.  PCP : [name], MD  Their chronic conditions include: Anxiety, HLD, HTN, MI, TIA, RLS.  Office Visits: 11/24/2019 [facility]): started sertraline taper, counseled on metformin and recommended f/u on DM labs. 11/03/2019 (PCP): ongoing reflux symptoms, omeprazole 40 mg switched to pantoprazole 40 mg  08/20/2019 (PCP): A1c increased to 8.4 from 6.6 in 10 months, started on metformin 500 mg twice daily. Hgb lower to 9.3, encouraged to take iron supplement.   Consult Visit: 6/3: upcoming pulmonology appointment with pulmonary   No diagnosis found. Patient Active Problem List   Diagnosis Date Noted  . Allergic rhinitis 08/19/2018  . Type 2 diabetes mellitus with diabetic neuropathy, unspecified ([place]) 11/05/2017  . Encounter for long-term use of muscle relaxants 09/24/2017  . CAD in native artery 09/24/2017  . OSA (obstructive sleep apnea) 09/24/2017  . [place] 09/24/2017  . Morbid obesity ([place]) 06/02/2017  . Depression 06/02/2017  . Iron deficiency anemia 09/23/2016  . Anemia of chronic disease 09/28/2015  . Genetic testing 08/21/2015  . History of left breast cancer 07/13/2015  . History of right breast cancer 06/20/2015  . Hearing loss due to cerumen impaction 12/29/2014  . Allergy to adhesive tape 05/19/2014  . Hyperlipidemia 12/06/2013  . GERD (gastroesophageal reflux disease) 12/06/2013  . Cervical disc disease 11/11/2013  . Osteopenia 05/25/2013  . Allergic asthma 12/24/2012  . Insomnia 04/02/2012  . Physical exam, annual 04/02/2012  . HTN (hypertension) 02/03/2012  . Vertigo, benign positional 02/03/2012  . Left groin pain 02/03/2012   Social History   Socioeconomic History  . Marital status: Divorced    Spouse  name: Not on file  . Number of children: 7  . Years of education: Not on file  . Highest education level: Not on file  Occupational History  . Occupation: retired  Tobacco Use  . Smoking status: Former Smoker    Packs/day: 1.00    Years: 20.00    Pack years: 20.00    Types: Cigarettes    Quit date: 07/30/2011    Years since quitting: 8.4  . Smokeless tobacco: Never Used  . Tobacco comment: Quit >4 years ago; 1 ppd for about 5/20 years (remaining was less)  Vaping Use  . Vaping Use: Former  Substance and Sexual Activity  . Alcohol use: No    Alcohol/week: 0.0 standard drinks  . Drug use: No  . Sexual activity: Not on file  Other Topics Concern  . Not on file  Social History Narrative   Lives alone.  Retired.  Education:  11th grade GED.  Children:  7 (one here).    Social Determinants of Health   Financial Resource Strain:   . Difficulty of Paying Living Expenses:   Food Insecurity: No Food Insecurity  . Worried About Charity fundraiser in the Last Year: Never true  . Ran Out of Food in the Last Year: Never true  Transportation Needs: No Transportation Needs  . Lack of Transportation (Medical): No  . Lack of Transportation (Non-Medical): No  Physical Activity:   . Days of Exercise per Week:   . Minutes of Exercise per Session:   Stress:   . Feeling of Stress :   Social Connections:   . Frequency of Communication with  Friends and Family:   . Frequency of Social Gatherings with Friends and Family:   . Attends Religious Services:   . Active Member of Clubs or Organizations:   . Attends Archivist Meetings:   Marland Kitchen Marital Status:    Family History  Problem Relation Age of Onset  . Diabetes Father   . Lung cancer Sister        dx. <50; former smoker  . Diabetes Brother   . Diabetes Paternal Aunt   . Stroke Maternal Grandmother   . Diabetes Paternal Grandmother   . Emphysema Mother 40       smoker  . Diabetes Brother   . Brain cancer Brother 32        unknown tumor type  . Cancer Daughter 22       neck cancer  . Other Daughter        hysterectomy for unspecified reason  . Breast cancer Cousin   . Cancer Cousin        unspecified type  . Breast cancer Other        triple negative breast cancer in her 64s  . Cancer Daughter   . Colon polyps Neg Hx   . Esophageal cancer Neg Hx   . Gallbladder disease Neg Hx    Allergies  Allergen Reactions  . Bacitracin-Neomycin-Polymyxin  [Neomycin-Bacitracin Zn-Polymyx] Swelling  . Nsaids Other (See Comments)    nosebleeds  . Tape Hives    Pt cannot tolerate bandaids, tape, or any other adhesives.   . Ambien [Zolpidem Tartrate]     Hives   . Amoxicillin     Rash   . Contrast Media [Iodinated Diagnostic Agents] Hives    Pt states hives with prior ct, was given benadryl to resolve  . Latex Swelling  . Prednisone    Outpatient Encounter Medications as of 01/24/2020  Medication Sig  . acetaminophen-codeine (TYLENOL #3) 300-30 MG tablet Take by mouth every 4 (four) hours as needed for moderate pain.  Marland Kitchen albuterol (PROAIR HFA) 108 (90 Base) MCG/ACT inhaler Inhale 2 puffs into the lungs every 4 (four) hours as needed for wheezing.  . Ascorbic Acid (VITAMIN C PO) Take by mouth.  Marland Kitchen aspirin 81 MG tablet Take 81 mg by mouth daily.  Marland Kitchen atorvastatin (LIPITOR) 10 MG tablet TAKE 1 TABLET BY MOUTH  DAILY  . ferrous sulfate 325 (65 FE) MG tablet Take 1 tablet (325 mg total) by mouth daily with breakfast.  . gabapentin (NEURONTIN) 300 MG capsule Take 1 capsule (300 mg total) by mouth 3 (three) times daily.  Marland Kitchen loratadine (CLARITIN) 10 MG tablet Take 10 mg by mouth daily.  Marland Kitchen losartan (COZAAR) 100 MG tablet TAKE 1 TABLET BY MOUTH  DAILY  . meclizine (ANTIVERT) 25 MG tablet TAKE 1 TABLET(25 MG) BY MOUTH THREE TIMES DAILY AS NEEDED FOR DIZZINESS  . MELATONIN PO Take by mouth.  . metFORMIN (GLUCOPHAGE) 500 MG tablet Take 1 tablet (500 mg total) by mouth 2 (two) times daily with a meal.  . mometasone (NASONEX)  50 MCG/ACT nasal spray Place 2 sprays into the nose daily.  . Multiple Vitamins-Minerals (ZINC PO) Take by mouth.  . pantoprazole (PROTONIX) 40 MG tablet Take 1 tablet (40 mg total) by mouth 2 (two) times daily.  . sertraline (ZOLOFT) 50 MG tablet TAKE 1 TABLET(50 MG) BY MOUTH DAILY  . tiZANidine (ZANAFLEX) 4 MG tablet Take 1 tablet (4 mg total) by mouth at bedtime.  . traZODone (DESYREL) 100 MG tablet  TAKE 1 TABLET BY MOUTH AT  BEDTIME   No facility-administered encounter medications on file as of 01/24/2020.   Current Diagnosis/Assessment: Goals Addressed   None     Diabetes   Recent Relevant Labs: Lab Results  Component Value Date/Time   HGBA1C 7.0 (H) 01/13/2020 01:52 PM   HGBA1C 8.4 (H) 08/20/2019 10:44 AM   EGFR 71 (L) 07/13/2015 03:16 PM    Patient has failed these meds in past: n/a.  According to January 2021 lab was uncontrolled - at this time increased to metformin 500 mg twice daily. Last visit 10/2019 was encouraged to follow-up on a1c. Labs remain past due. Patient reminded of f/u on labs during today's visit.  Plan Continue current medications and control with diet and exercise.    Anxiety   Discussed sertraline 50 mg taper at last visit - previous plan was to take half tablet (25 mg) daily for 2 weeks then stop taking sertraline.  Patient wishes to halve tablet until medication is gone then stop. Denies any worsening anxiety or side effects.   Plan Take half of sertraline 50 mg tablet (25 mg) until gone.     GERD   Acid reflux is ongoing issue for patient. Has recently switched from omeprazole 40 mg to pantoprazole 40 mg. Still experiencing symptoms of reflux/cough and was originally set up to see pulm 12/01/2019. This has since been rescheduled to 12/30/2019.   Plan  Continue current medication.   Follow-Up: 1 month telephone vist DM, anxiety, gerd/pulm    [name], Pharm.D. Clinical Pharmacist [facility] [phone]

## 2020-01-24 NOTE — Progress Notes (Addendum)
Patient Care Team: [name], MD as PCP - General (Family Medicine) [name], MD as Attending Physician (Physical Medicine and Rehabilitation) [name], MD as Consulting Physician (Orthopedic Surgery) [name], MD as Consulting Physician (Otolaryngology) [name], MD as Consulting Physician (Gastroenterology) [name], MD as Consulting Physician (General Surgery) [name], MD as Consulting Physician (Hematology and Oncology) [name], MD as Consulting Physician (Radiation Oncology) [name], NP as Nurse Practitioner (Hematology and Oncology) [name], [facility] as Pharmacist (Pharmacist)  DIAGNOSIS:    ICD-10-CM   1. Multiple myeloma not having achieved remission (Julian)  C90.00 UPEP/TP, 24-Hr Urine    Beta 2 microglobulin, serum    Multiple Myeloma Panel (SPEP&IFE w/QIG)    Kappa/lambda light chains    SUMMARY OF ONCOLOGIC HISTORY: Oncology History  History of right breast cancer  04/16/2000 Surgery   Left breast: Triple negative  invasive ductal cancer treated with lumpectomy, adjuvant chemotherapy, radiation , in [place], unknown stage   06/07/2015 Mammogram   Right breast mass 6x 6 x 5 mm, right axillary lymph node with slight cortex thickening measured 5 mm    06/13/2015 Initial Diagnosis   Right breast needle biopsy: Invasive ductal carcinoma, grade 1, right axillary lymph node biopsy negative , ER 95%, PR 5%, Ki-67 10%, HER-2 negative   06/13/2015 Clinical Stage   Stage IA: T1b N0   07/07/2015 Surgery   Right lumpectomy: Invasive ductal carcinoma grade 1, 1 cm span, with low-grade DCIS, DCIS focally 0.1 cm to inferior margin, 0/3 lymph nodes negative, ER 95%, PR 5%, HER-2 negative ratio 1.1, Ki-67 10%   07/07/2015 Pathologic Stage   Stage IA: T1c N0   07/13/2015 Procedure   Breast High/Moderate Risk Panel reveals no clinically significant variant at ATM, BRCA1, BRCA2, CDH1, CHEK2, PALB2, PTEN, and TP53.     08/23/2015 - 09/21/2015 Radiation Therapy   Adjuvant Radiation: Right breast/ 42.5Gy at 2.5 Gy per fraction x 17 fractions.  Right breast boost/ 7.5 Gy at 2.5 Gy per fraction x 3 fractions    Anti-estrogen oral therapy   Patient refused antiestrogen therapy   10/20/2015 Survivorship   Survivorship care plan completed and mailed to patient in lieu of in person visit at her request     CHIEF COMPLIANT: Surveillance of right breast cancer, recent bone marrow biopsy  INTERVAL HISTORY: Tracy [name] is a 74 y.o. with above-mentioned history of right breast cancer treated with lumpectomy, radiation, and who is currently on surveillance as she declined antiestrogen therapy. I last saw her almost 4 years ago. CT abdomen.pelvis on 01/07/20 for a cough and persistent acid reflux showed lytic lesions in the pelvis and L5 vertebral body, likely metastatic disease. She underwent a bone marrow biopsy on 01/17/20 for which pathology showed findings consistent with a plasma cell neoplasm. PET scan on 01/20/20 showed hypermetabolism corresponding to left acetabular and L5 lytic lesions, with differential considerations of metastatic disease or myeloma. She presents to the clinic today for evaluation and discussion of treatment options.   ALLERGIES:  is allergic to bacitracin-neomycin-polymyxin  [neomycin-bacitracin zn-polymyx], nsaids, tape, ambien [zolpidem tartrate], amoxicillin, contrast media [iodinated diagnostic agents], latex, and prednisone.  MEDICATIONS:  Current Outpatient Medications  Medication Sig Dispense Refill  . aspirin 81 MG tablet Take 81 mg by mouth daily.    Marland Kitchen atorvastatin (LIPITOR) 10 MG tablet TAKE 1 TABLET BY MOUTH  DAILY 90 tablet 3  . ferrous sulfate 325 (65 FE) MG tablet Take 1 tablet (325  mg total) by mouth daily with breakfast. 30 tablet 6  . gabapentin (NEURONTIN) 300 MG capsule Take 1 capsule (300 mg total) by mouth 3 (three) times daily. 270 capsule 1  . loratadine (CLARITIN)  10 MG tablet Take 10 mg by mouth daily.    Marland Kitchen losartan (COZAAR) 100 MG tablet TAKE 1 TABLET BY MOUTH  DAILY 90 tablet 3  . MELATONIN PO Take by mouth.    . pantoprazole (PROTONIX) 40 MG tablet Take 1 tablet (40 mg total) by mouth 2 (two) times daily. 60 tablet 3  . sertraline (ZOLOFT) 50 MG tablet TAKE 1 TABLET(50 MG) BY MOUTH DAILY 90 tablet 0  . tiZANidine (ZANAFLEX) 4 MG tablet Take 1 tablet (4 mg total) by mouth at bedtime. 30 tablet 3  . traZODone (DESYREL) 100 MG tablet TAKE 1 TABLET BY MOUTH AT  BEDTIME 90 tablet 3   No current facility-administered medications for this visit.    PHYSICAL EXAMINATION: ECOG PERFORMANCE STATUS: 2 - Symptomatic, <50% confined to bed  Vitals:   01/25/20 0935  BP: 136/79  Pulse: 64  Resp: 17  Temp: 98.3 F (36.8 C)  SpO2: 100%   Filed Weights   01/25/20 0935  Weight: 192 lb 8 oz (87.3 kg)    LABORATORY DATA:  I have reviewed the data as listed CMP Latest Ref Rng & Units 01/25/2020 01/19/2020 01/13/2020  Glucose 70 - 99 mg/dL 129(H) 146(H) 100(H)  BUN 8 - 23 mg/dL '12 16 17  ' Creatinine 0.44 - 1.00 mg/dL 1.38(H) 1.19 1.31(H)  Sodium 135 - 145 mmol/L 137 131(L) 132(L)  Potassium 3.5 - 5.1 mmol/L 4.6 4.0 4.4  Chloride 98 - 111 mmol/L 109 101 104  CO2 22 - 32 mmol/L 21(L) 25 24  Calcium 8.9 - 10.3 mg/dL 8.9 8.7 8.6  Total Protein 6.5 - 8.1 g/dL 10.1(H) - 10.2(H)  Total Bilirubin 0.3 - 1.2 mg/dL <0.2(L) - 0.2  Alkaline Phos 38 - 126 U/L 81 - 81  AST 15 - 41 U/L 15 - 12  ALT 0 - 44 U/L 16 - 11    Lab Results  Component Value Date   WBC 6.3 01/25/2020   HGB 7.5 (L) 01/25/2020   HCT 24.0 (L) 01/25/2020   MCV 98.4 01/25/2020   PLT 284 01/25/2020   NEUTROABS 3.2 01/25/2020    ASSESSMENT & PLAN:  Multiple myeloma (HCC) CT abdomen.pelvis on 01/07/20 for a cough and persistent acid reflux showed lytic lesions in the pelvis and L5 vertebral body She underwent a bone marrow biopsy on 01/17/20: 10 to 25% plasma cells  PET scan on 01/20/20 showed  hypermetabolism corresponding to left acetabular and L5 lytic lesions [employer]: 25 6XX, fra (15) (q11.2)  Counseling: I discussed with the patient that the biopsies revealed multiple myeloma rather than breast cancer. We discussed the plasma cell neoplasm spectrum and went over the biology of myeloma.  Treatment plan: 1.  Obtain labs today 2.  Refer the patient to Dr. Irene Limbo for treatment options. I briefly discussed with her regarding prognosis and treatment options for myeloma.  Patient will be followed by Dr. Irene Limbo.    Orders Placed This Encounter  Procedures  . UPEP/TP, 24-Hr Urine    Standing Status:   Future    Standing Expiration Date:   01/24/2021  . Beta 2 microglobulin, serum    Standing Status:   Future    Number of Occurrences:   1    Standing Expiration Date:   01/24/2021  . Multiple  Myeloma Panel (SPEP&IFE w/QIG)    Standing Status:   Future    Number of Occurrences:   1    Standing Expiration Date:   01/24/2021  . Kappa/lambda light chains    Standing Status:   Future    Number of Occurrences:   1    Standing Expiration Date:   01/24/2021   The patient has a good understanding of the overall plan. she agrees with it. she will call with any problems that may develop before the next visit here.  Total time spent: 30 mins including face to face time and time spent for planning, charting and coordination of care  [name], MD 01/25/2020  I, Cloyde Reams Dorshimer, am acting as scribe for Dr. [name].  I have reviewed the above documentation for accuracy and completeness, and I agree with the above.

## 2020-01-24 NOTE — Telephone Encounter (Signed)
Pt returning Dr. Ainsley Spinner call.  Please advise.  [phone]

## 2020-01-25 ENCOUNTER — Inpatient Hospital Stay: Payer: Medicare Other | Attending: Hematology and Oncology | Admitting: Hematology and Oncology

## 2020-01-25 ENCOUNTER — Inpatient Hospital Stay: Payer: Medicare Other

## 2020-01-25 ENCOUNTER — Encounter (HOSPITAL_COMMUNITY): Payer: Self-pay | Admitting: Hematology and Oncology

## 2020-01-25 ENCOUNTER — Other Ambulatory Visit: Payer: Self-pay

## 2020-01-25 ENCOUNTER — Telehealth: Payer: Self-pay | Admitting: Hematology and Oncology

## 2020-01-25 DIAGNOSIS — Z853 Personal history of malignant neoplasm of breast: Secondary | ICD-10-CM

## 2020-01-25 DIAGNOSIS — R05 Cough: Secondary | ICD-10-CM | POA: Insufficient documentation

## 2020-01-25 DIAGNOSIS — C9000 Multiple myeloma not having achieved remission: Secondary | ICD-10-CM

## 2020-01-25 DIAGNOSIS — K219 Gastro-esophageal reflux disease without esophagitis: Secondary | ICD-10-CM | POA: Insufficient documentation

## 2020-01-25 DIAGNOSIS — Z9221 Personal history of antineoplastic chemotherapy: Secondary | ICD-10-CM | POA: Diagnosis not present

## 2020-01-25 DIAGNOSIS — Z923 Personal history of irradiation: Secondary | ICD-10-CM | POA: Diagnosis not present

## 2020-01-25 LAB — CMP (CANCER CENTER ONLY)
ALT: 16 U/L (ref 0–44)
AST: 15 U/L (ref 15–41)
Albumin: 2.8 g/dL — ABNORMAL LOW (ref 3.5–5.0)
Alkaline Phosphatase: 81 U/L (ref 38–126)
Anion gap: 7 (ref 5–15)
BUN: 12 mg/dL (ref 8–23)
CO2: 21 mmol/L — ABNORMAL LOW (ref 22–32)
Calcium: 8.9 mg/dL (ref 8.9–10.3)
Chloride: 109 mmol/L (ref 98–111)
Creatinine: 1.38 mg/dL — ABNORMAL HIGH (ref 0.44–1.00)
GFR, Est AFR Am: 43 mL/min — ABNORMAL LOW (ref >60)
GFR, Estimated: 37 mL/min — ABNORMAL LOW (ref >60)
Glucose, Bld: 129 mg/dL — ABNORMAL HIGH (ref 70–99)
Potassium: 4.6 mmol/L (ref 3.5–5.1)
Sodium: 137 mmol/L (ref 135–145)
Total Bilirubin: <0.2 mg/dL — ABNORMAL LOW (ref 0.3–1.2)
Total Protein: 10.1 g/dL — ABNORMAL HIGH (ref 6.5–8.1)

## 2020-01-25 LAB — CBC WITH DIFFERENTIAL (CANCER CENTER ONLY)
Abs Immature Granulocytes: 0.02 10*3/uL (ref 0.00–0.07)
Basophils Absolute: 0 10*3/uL (ref 0.0–0.1)
Basophils Relative: 1 %
Eosinophils Absolute: 0.1 10*3/uL (ref 0.0–0.5)
Eosinophils Relative: 1 %
HCT: 24 % — ABNORMAL LOW (ref 36.0–46.0)
Hemoglobin: 7.5 g/dL — ABNORMAL LOW (ref 12.0–15.0)
Immature Granulocytes: 0 %
Lymphocytes Relative: 40 %
Lymphs Abs: 2.6 10*3/uL (ref 0.7–4.0)
MCH: 30.7 pg (ref 26.0–34.0)
MCHC: 31.3 g/dL (ref 30.0–36.0)
MCV: 98.4 fL (ref 80.0–100.0)
Monocytes Absolute: 0.4 10*3/uL (ref 0.1–1.0)
Monocytes Relative: 6 %
Neutro Abs: 3.2 10*3/uL (ref 1.7–7.7)
Neutrophils Relative %: 52 %
Platelet Count: 284 10*3/uL (ref 150–400)
RBC: 2.44 MIL/uL — ABNORMAL LOW (ref 3.87–5.11)
RDW: 14.4 % (ref 11.5–15.5)
WBC Count: 6.3 10*3/uL (ref 4.0–10.5)
nRBC: 0 % (ref 0.0–0.2)

## 2020-01-25 NOTE — Addendum Note (Signed)
Addended by: [name] on: 01/25/2020 10:52 AM   Modules accepted: Orders

## 2020-01-25 NOTE — Telephone Encounter (Signed)
Attempted to call Ms. Adachi back, left a message.  Julian Hy, DO 01/25/20 8:53 AM [facility]

## 2020-01-25 NOTE — Telephone Encounter (Signed)
Scheduled appt per 6/29 los. Gave pt a print out of appt calendar.

## 2020-01-25 NOTE — Telephone Encounter (Signed)
Pt returning call.  [phone].  Pt home the rest of the day.

## 2020-01-25 NOTE — Assessment & Plan Note (Signed)
CT abdomen.pelvis on 01/07/20 for a cough and persistent acid reflux showed lytic lesions in the pelvis and L5 vertebral body She underwent a bone marrow biopsy on 01/17/20: 10 to 25% plasma cells  PET scan on 01/20/20 showed hypermetabolism corresponding to left acetabular and L5 lytic lesions  Counseling: I discussed with the patient that the biopsies revealed multiple myeloma rather than breast cancer.  Treatment plan: 1.  Obtain labs today 2.  Refer the patient to Dr. Lorenso Courier for treatment options. I briefly discussed with her regarding prognosis and treatment options for myeloma.  Patient will be followed by Dr. Lorenso Courier.

## 2020-01-26 ENCOUNTER — Encounter (HOSPITAL_COMMUNITY): Payer: Self-pay | Admitting: Hematology and Oncology

## 2020-01-26 LAB — KAPPA/LAMBDA LIGHT CHAINS
Kappa free light chain: 22.7 mg/L — ABNORMAL HIGH (ref 3.3–19.4)
Kappa, lambda light chain ratio: 1.28 (ref 0.26–1.65)
Lambda free light chains: 17.7 mg/L (ref 5.7–26.3)

## 2020-01-27 ENCOUNTER — Encounter: Payer: Self-pay | Admitting: Critical Care Medicine

## 2020-01-27 ENCOUNTER — Other Ambulatory Visit: Payer: Self-pay

## 2020-01-27 ENCOUNTER — Ambulatory Visit: Payer: Medicare Other | Admitting: Critical Care Medicine

## 2020-01-27 VITALS — BP 128/60 | HR 92 | Temp 98.8°F | Ht 60.43 in | Wt 191.2 lb

## 2020-01-27 DIAGNOSIS — R05 Cough: Secondary | ICD-10-CM | POA: Diagnosis not present

## 2020-01-27 DIAGNOSIS — R053 Chronic cough: Secondary | ICD-10-CM

## 2020-01-27 DIAGNOSIS — K2289 Other specified disease of esophagus: Secondary | ICD-10-CM

## 2020-01-27 DIAGNOSIS — K228 Other specified diseases of esophagus: Secondary | ICD-10-CM | POA: Diagnosis not present

## 2020-01-27 LAB — MULTIPLE MYELOMA PANEL, SERUM
Albumin SerPl Elph-Mcnc: 3.4 g/dL (ref 2.9–4.4)
Albumin/Glob SerPl: 0.6 — ABNORMAL LOW (ref 0.7–1.7)
Alpha 1: 0.3 g/dL (ref 0.0–0.4)
Alpha2 Glob SerPl Elph-Mcnc: 1.2 g/dL — ABNORMAL HIGH (ref 0.4–1.0)
B-Globulin SerPl Elph-Mcnc: 1.3 g/dL (ref 0.7–1.3)
Gamma Glob SerPl Elph-Mcnc: 3.9 g/dL — ABNORMAL HIGH (ref 0.4–1.8)
Globulin, Total: 6.7 g/dL — ABNORMAL HIGH (ref 2.2–3.9)
IgA: 143 mg/dL (ref 64–422)
IgG (Immunoglobin G), Serum: 5220 mg/dL — ABNORMAL HIGH (ref 586–1602)
IgM (Immunoglobulin M), Srm: 80 mg/dL (ref 26–217)
M Protein SerPl Elph-Mcnc: 3.7 g/dL — ABNORMAL HIGH
Total Protein ELP: 10.1 g/dL — ABNORMAL HIGH (ref 6.0–8.5)

## 2020-01-27 NOTE — Progress Notes (Signed)
Synopsis: Referred in June 2021 for chronic bronchitis by [name], MD.  Subjective:   PATIENT ID: Tracy [name] GENDER: female DOB: 01-Feb-1946, MRN: [id number]  Chief Complaint  Patient presents with  . Follow-up    Patient did PFT on 6/18 and had PET scan on 6/24. Patient has shortness of breath with exertion. States she was wheezing a lot yesterday. Has dry cough when she has trouble breathing       Tracy [name] is a 74 year old woman who presented a few weeks ago for evaluation of nocturnal cough and sputum production.  She had multiple complaints suspicious for GERD and dyspahgia, which were evaluated with an abdominal CT scan.  Incidentally on that scan she was noted to have lytic bone lesions, which have since been biopsied.  She was diagnosed with multiple myeloma and followed up with Dr. [name] on 6/29, who was previously treated her for breast cancer.  She has been referred to Dr. Irene Limbo for ongoing management of MM. Most recent hemoglobin by PCP 7.5.  Referred to GI for evaluation of heme positive stool. She currently has SOB with exertion and cough only with exertion. She had wheezing yesterday. She has had bilateral hip pain since her bilateral biopsies, but had some pain prior to the biopsy.  She noticed her breathing issues starting around the time of her bone pain. She has previously been evaluated by ENT (Dr. Redmond Baseman) who felt that her cough was more related to GERD and postnasal drip.    OV 12/30/19: Tracy [name] is a 74 year old woman who presents for evaluation of nocturnal cough and white thick sputum production.  She has previously been evaluated by ENT (Dr. Redmond Baseman at [facility] in 09/2019- note reviewed) due to concern for postnasal drip causing cough, but he was concerned that this was more related to GERD.  She had no notable sinus disease.  She wakes up at night short of breath and has to cough to clear her throat.  She always coughs up thick white sputum.  This happens  occasionally throughout the day.  Her episodes have been going on for about a year, but do not occur every day.  Sometimes after these episodes she has a sour taste in her mouth.  When she wakes up in the morning she has a sore throat, which she treats with tea and honey to soothe her throat.  When she swallows she has a sticking sensation in her chest and occasionally has heartburn.  Today she has left-sided chest pain is an ache that radiates into her arm and back, and she has had this several other times before.  The pain is gradually becoming more persistent.  This is associated with movement, including walking.  She does not think this is something that warrants going to the emergency room.  This pain has been becoming more persistent over time.  She gets shortness of breath associated with walking and moving around as well.  This has been going on since the belching and reflux issues started.  She thinks this is related to the chest pain.  She occasionally has wheezing, which improves with albuterol. She has allergies, which are well controlled with Claritin.  Her PCP recently increased her GERD regimen from omeprazole to pantoprazole twice daily; she cannot tell a significant difference.  She has frequently more belching, which she has been taking Gas-X in addition to her PPI.  She notices that she gets full faster, has post-prandial nausea and abdominal distention for  about the past month, but no pain. Yesterday she had to vomit after eating lunch. She has been losing weigh, but has been modifying her diet some too.  She quit smoking in 2013 after 20 years x 0.5 packs/day.  No history of vaping.  She has a history of bilateral breast cancer- in the early 2000's and again in 2016.  She had radiation on both sides.  She has chronic lymphedema in the left arm from previous breast cancer surgery.  No postnasal drip hoarseness   Past Medical History:  Diagnosis Date  . Anxiety   . Arthritis   . Breast  cancer ([place]) 06/13/15  . Cancer ([place]) 2000   breast cancer  . Chronic bronchitis ([place])   . Chronic bronchitis ([place])   . Hyperlipidemia   . Hypertension   . Myocardial infarction ([place]) 2001  . Personal history of radiation therapy   . Restless leg   . Stroke [facility]) 2004   TIA, no deficits     Family History  Problem Relation Age of Onset  . Diabetes Father   . Lung cancer Sister        dx. <50; former smoker  . Diabetes Brother   . Diabetes Paternal Aunt   . Stroke Maternal Grandmother   . Diabetes Paternal Grandmother   . Emphysema Mother 84       smoker  . Diabetes Brother   . Brain cancer Brother 75       unknown tumor type  . Cancer Daughter 69       neck cancer  . Other Daughter        hysterectomy for unspecified reason  . Breast cancer Cousin   . Cancer Cousin        unspecified type  . Breast cancer Other        triple negative breast cancer in her 20s  . Cancer Daughter   . Colon polyps Neg Hx   . Esophageal cancer Neg Hx   . Gallbladder disease Neg Hx      Past Surgical History:  Procedure Laterality Date  . ABDOMINAL HYSTERECTOMY  1985  . BREAST LUMPECTOMY Left 2000   radiation and chemo  . BREAST LUMPECTOMY Right 2016   radiation  . BREAST SURGERY  2001   lt breast lumpectomy  . RADIOACTIVE SEED GUIDED PARTIAL MASTECTOMY WITH AXILLARY SENTINEL LYMPH NODE BIOPSY Right 07/07/2015   Procedure: RIGHT RADIOACTIVE SEED GUIDED PARTIAL MASTECTOMY WITH AXILLARY SENTINEL LYMPH NODE BIOPSY;  Surgeon: Autumn Messing III, MD;  Location: [facility];  Service: General;  Laterality: Right;  . SMALL INTESTINE SURGERY    . TUBAL LIGATION      Social History   Socioeconomic History  . Marital status: Divorced    Spouse name: Not on file  . Number of children: 7  . Years of education: Not on file  . Highest education level: Not on file  Occupational History  . Occupation: retired  Tobacco Use  . Smoking status: Former Smoker    Packs/day: 1.00     Years: 20.00    Pack years: 20.00    Types: Cigarettes    Quit date: 07/30/2011    Years since quitting: 8.5  . Smokeless tobacco: Never Used  . Tobacco comment: Quit >4 years ago; 1 ppd for about 5/20 years (remaining was less)  Vaping Use  . Vaping Use: Former  Substance and Sexual Activity  . Alcohol use: No    Alcohol/week: 0.0 standard  drinks  . Drug use: No  . Sexual activity: Not on file  Other Topics Concern  . Not on file  Social History Narrative   Lives alone.  Retired.  Education:  11th grade GED.  Children:  7 (one here).    Social Determinants of Health   Financial Resource Strain:   . Difficulty of Paying Living Expenses:   Food Insecurity: No Food Insecurity  . Worried About Charity fundraiser in the Last Year: Never true  . Ran Out of Food in the Last Year: Never true  Transportation Needs: No Transportation Needs  . Lack of Transportation (Medical): No  . Lack of Transportation (Non-Medical): No  Physical Activity:   . Days of Exercise per Week:   . Minutes of Exercise per Session:   Stress:   . Feeling of Stress :   Social Connections:   . Frequency of Communication with Friends and Family:   . Frequency of Social Gatherings with Friends and Family:   . Attends Religious Services:   . Active Member of Clubs or Organizations:   . Attends Archivist Meetings:   Marland Kitchen Marital Status:   Intimate Partner Violence:   . Fear of Current or Ex-Partner:   . Emotionally Abused:   Marland Kitchen Physically Abused:   . Sexually Abused:      Allergies  Allergen Reactions  . Bacitracin-Neomycin-Polymyxin  [Neomycin-Bacitracin Zn-Polymyx] Swelling  . Nsaids Other (See Comments)    nosebleeds  . Tape Hives    Pt cannot tolerate bandaids, tape, or any other adhesives.   . Ambien [Zolpidem Tartrate]     Hives   . Amoxicillin     Rash   . Contrast Media [Iodinated Diagnostic Agents] Hives    Pt states hives with prior ct, was given benadryl to resolve  . Latex  Swelling  . Prednisone      Immunization History  Administered Date(s) Administered  . Influenza Split 04/28/2010, 06/24/2011  . Influenza Whole 04/22/2013  . Influenza, High Dose Seasonal PF 05/30/2016, 05/20/2018, 04/09/2019  . Influenza,inj,Quad PF,6+ Mos 04/29/2015, 05/16/2017  . Influenza-Unspecified 05/03/2014  . Moderna SARS-COVID-2 Vaccination 09/27/2019, 10/25/2019  . Pneumococcal Conjugate-13 06/01/2015  . Pneumococcal Polysaccharide-23 05/10/2014  . Td 04/05/2013  . Zoster 05/01/2010    Outpatient Medications Prior to Visit  Medication Sig Dispense Refill  . aspirin 81 MG tablet Take 81 mg by mouth daily.    Marland Kitchen atorvastatin (LIPITOR) 10 MG tablet TAKE 1 TABLET BY MOUTH  DAILY 90 tablet 3  . ferrous sulfate 325 (65 FE) MG tablet Take 1 tablet (325 mg total) by mouth daily with breakfast. 30 tablet 6  . gabapentin (NEURONTIN) 300 MG capsule Take 1 capsule (300 mg total) by mouth 3 (three) times daily. 270 capsule 1  . loratadine (CLARITIN) 10 MG tablet Take 10 mg by mouth daily.    Marland Kitchen losartan (COZAAR) 100 MG tablet TAKE 1 TABLET BY MOUTH  DAILY 90 tablet 3  . pantoprazole (PROTONIX) 40 MG tablet Take 1 tablet (40 mg total) by mouth 2 (two) times daily. 60 tablet 3  . sertraline (ZOLOFT) 50 MG tablet TAKE 1 TABLET(50 MG) BY MOUTH DAILY 90 tablet 0  . tiZANidine (ZANAFLEX) 4 MG tablet Take 1 tablet (4 mg total) by mouth at bedtime. 30 tablet 3  . traZODone (DESYREL) 100 MG tablet TAKE 1 TABLET BY MOUTH AT  BEDTIME 90 tablet 3  . MELATONIN PO Take by mouth. (Patient not taking: Reported on 01/27/2020)  No facility-administered medications prior to visit.    Review of Systems  Constitutional: Positive for weight loss. Negative for fever.  HENT: Negative for congestion.        No post nasal drip; +hoarseness  Respiratory: Positive for cough, sputum production, shortness of breath and wheezing.   Cardiovascular: Positive for chest pain.  Gastrointestinal: Positive for  heartburn, nausea and vomiting.  Neurological: Negative for dizziness and weakness.  Endo/Heme/Allergies: Positive for environmental allergies.     Objective:   Vitals:   01/27/20 1043  BP: 128/60  Pulse: 92  Temp: 98.8 F (37.1 C)  TempSrc: Oral  SpO2: 93%  Weight: 191 lb 3.2 oz (86.7 kg)  Height: 5' 0.43" (1.535 m)   93% on   RA BMI Readings from Last 3 Encounters:  01/27/20 36.81 kg/m  01/25/20 37.60 kg/m  01/17/20 37.30 kg/m   Wt Readings from Last 3 Encounters:  01/27/20 191 lb 3.2 oz (86.7 kg)  01/25/20 192 lb 8 oz (87.3 kg)  01/17/20 191 lb (86.6 kg)    Physical Exam Vitals reviewed.  Constitutional:      Appearance: Normal appearance. She is obese. She is not ill-appearing.  HENT:     Head: Normocephalic and atraumatic.  Eyes:     General: No scleral icterus. Cardiovascular:     Rate and Rhythm: Normal rate and regular rhythm.     Heart sounds: No murmur heard.   Pulmonary:     Comments: Breathing comfortably on room air, no conversational dyspnea.  Clear to auscultation bilaterally. Abdominal:     General: There is no distension.     Palpations: Abdomen is soft.     Tenderness: There is no abdominal tenderness.  Musculoskeletal:        General: No swelling or deformity.     Cervical back: Neck supple.  Lymphadenopathy:     Cervical: No cervical adenopathy.  Skin:    General: Skin is warm and dry.     Findings: No rash.  Neurological:     General: No focal deficit present.     Mental Status: She is alert.     Coordination: Coordination normal.  Psychiatric:        Mood and Affect: Mood normal.        Behavior: Behavior normal.      CBC    Component Value Date/Time   WBC 6.3 01/25/2020 0907   WBC 7.6 01/17/2020 0805   RBC 2.44 (L) 01/25/2020 0907   HGB 7.5 (L) 01/25/2020 0907   HGB 9.7 (L) 07/13/2015 1516   HCT 24.0 (L) 01/25/2020 0907   HCT 30.2 (L) 07/13/2015 1516   PLT 284 01/25/2020 0907   PLT 317 07/13/2015 1516   MCV 98.4  01/25/2020 0907   MCV 86.8 07/13/2015 1516   MCH 30.7 01/25/2020 0907   MCHC 31.3 01/25/2020 0907   RDW 14.4 01/25/2020 0907   RDW 14.5 07/13/2015 1516   LYMPHSABS 2.6 01/25/2020 0907   LYMPHSABS 3.7 (H) 07/13/2015 1516   MONOABS 0.4 01/25/2020 0907   MONOABS 0.5 07/13/2015 1516   EOSABS 0.1 01/25/2020 0907   EOSABS 0.1 07/13/2015 1516   BASOSABS 0.0 01/25/2020 0907   BASOSABS 0.0 07/13/2015 1516    CHEMISTRY Recent Labs  Lab 01/25/20 0907  NA 137  K 4.6  CL 109  CO2 21*  GLUCOSE 129*  BUN 12  CREATININE 1.38*  CALCIUM 8.9   Estimated Creatinine Clearance: 34.8 mL/min (A) (by C-G formula based on SCr of  1.38 mg/dL (H)).   Chest Imaging- films reviewed: CXR, 2 view 10/12/2018- axillary surgical clips on the right.  Increased bibasilar lung markings.  NM PET 1/60/1093- hypermetabolic activity in the spine and left proximal femur.  Chest CT with significant motion artifact but no obvious masses or opacities.  Possibly a few areas of centrilobular emphysema.  Mediastinal vascular calcifications.  No significant adenopathy.  Mildly dilated distal esophagus.  Esophagram 01/07/2020- esophageal dysmotility, likely presbyesophagus with contrast stasis in the mid and distal esophagus.  No anatomic explanation for dysphagia.  Pulmonary Functions Testing Results: PFT Results Latest Ref Rng & Units 01/14/2020  FVC-Pre L 1.66  FVC-Predicted Pre % 93  FVC-Post L 1.69  FVC-Predicted Post % 95  Pre FEV1/FVC % % 75  Post FEV1/FCV % % 78  FEV1-Pre L 1.25  FEV1-Predicted Pre % 92  FEV1-Post L 1.32  DLCO UNC% % 60  DLCO COR %Predicted % 89  TLC L 4.10  TLC % Predicted % 91  RV % Predicted % 115   2021- No significant obstruction or bronchodilator reversibility.  No restriction or air trapping.  Mild diffusion impairment (uncorrected for hemoglobin 7.5).  EKG 12/30/2019-normal sinus rhythm, normal axis.  PR, QRS, QTc interval.  Normal R wave progression across precordial's.  No  ischemic ST changes or T wave inversions.  No changes compared to 09/28/2018 EKG.     Assessment & Plan:     ICD-10-CM   1. Chronic cough  R05   2. Presbyesophagus  K22.8     Atypical persistent chest pain-presume this is associated with chronic esophageal dysmotility, which is likely presbyesophagus.  GI symptoms related to mild hypercalcemia (recent corrected calcium 10.3, which is the upper limit of normal.) -Reviewed CT and esophagram findings with her. -Recommend taking a vitamin D supplement without calcium supplement. -Agree with GI referral.  She has seen Dr. Ardis Hughs in the past.  She will do this once she is able to start on treatment for multiple myeloma.   Cough with shortness of breath.  Shortness of breath likely due to anemia.  Cough of unknown etiology, but remain concerned for potential aspiration with dysphagia. -Continue PPI twice daily -PFTs do not explain a cause of shortness of breath other than reduced diffusion, which could be explained by severe anemia, which is likely due to multiple myeloma.  Controlled with Dr. Irene Limbo for evaluation. -No additional pulmonary evaluation required at this point.  RTC in 6 months.     Current Outpatient Medications:  .  aspirin 81 MG tablet, Take 81 mg by mouth daily., Disp: , Rfl:  .  atorvastatin (LIPITOR) 10 MG tablet, TAKE 1 TABLET BY MOUTH  DAILY, Disp: 90 tablet, Rfl: 3 .  ferrous sulfate 325 (65 FE) MG tablet, Take 1 tablet (325 mg total) by mouth daily with breakfast., Disp: 30 tablet, Rfl: 6 .  gabapentin (NEURONTIN) 300 MG capsule, Take 1 capsule (300 mg total) by mouth 3 (three) times daily., Disp: 270 capsule, Rfl: 1 .  loratadine (CLARITIN) 10 MG tablet, Take 10 mg by mouth daily., Disp: , Rfl:  .  losartan (COZAAR) 100 MG tablet, TAKE 1 TABLET BY MOUTH  DAILY, Disp: 90 tablet, Rfl: 3 .  pantoprazole (PROTONIX) 40 MG tablet, Take 1 tablet (40 mg total) by mouth 2 (two) times daily., Disp: 60 tablet, Rfl: 3 .   sertraline (ZOLOFT) 50 MG tablet, TAKE 1 TABLET(50 MG) BY MOUTH DAILY, Disp: 90 tablet, Rfl: 0 .  tiZANidine (ZANAFLEX) 4 MG  tablet, Take 1 tablet (4 mg total) by mouth at bedtime., Disp: 30 tablet, Rfl: 3 .  traZODone (DESYREL) 100 MG tablet, TAKE 1 TABLET BY MOUTH AT  BEDTIME, Disp: 90 tablet, Rfl: 3 .  MELATONIN PO, Take by mouth. (Patient not taking: Reported on 01/27/2020), Disp: , Rfl:       Julian Hy, DO [facility] 01/27/2020 10:46 AM

## 2020-01-27 NOTE — Patient Instructions (Addendum)
Thank you for visiting Dr. Carlis Abbott at [facility]. We recommend the following:  Follow up with GI- Dr. Ardis Hughs, and Oncology- Dr. Irene Limbo. Switch to vitamin D (D3) supplement without calcium added.  Return in about 6 months (around 07/29/2020).    Please do your part to reduce the spread of COVID-19.

## 2020-01-28 ENCOUNTER — Telehealth: Payer: Self-pay | Admitting: Hematology

## 2020-01-28 LAB — UPEP/TP, 24-HR URINE
Albumin, U: 100 %
Alpha 1, Urine: 0 %
Alpha 2, Urine: 0 %
Beta, Urine: 0 %
Gamma Globulin, Urine: 0 %
Total Protein, Urine-Ur/day: 66 mg/24 hr (ref 30–150)
Total Protein, Urine: 8.3 mg/dL
Total Volume: 800

## 2020-01-28 NOTE — Telephone Encounter (Signed)
Pt has been rescheduled to see Dr. Irene Limbo on 7/6 at 12:15pm

## 2020-02-01 ENCOUNTER — Inpatient Hospital Stay: Payer: Medicare Other | Attending: Hematology | Admitting: Hematology

## 2020-02-01 ENCOUNTER — Ambulatory Visit: Payer: Medicare Other | Admitting: Hematology

## 2020-02-01 ENCOUNTER — Other Ambulatory Visit: Payer: Self-pay

## 2020-02-01 ENCOUNTER — Telehealth: Payer: Self-pay | Admitting: Family Medicine

## 2020-02-01 VITALS — BP 170/58 | HR 80 | Temp 97.9°F | Resp 18 | Ht 60.43 in | Wt 192.4 lb

## 2020-02-01 DIAGNOSIS — C9000 Multiple myeloma not having achieved remission: Secondary | ICD-10-CM

## 2020-02-01 DIAGNOSIS — Z923 Personal history of irradiation: Secondary | ICD-10-CM | POA: Diagnosis not present

## 2020-02-01 DIAGNOSIS — Z5112 Encounter for antineoplastic immunotherapy: Secondary | ICD-10-CM | POA: Insufficient documentation

## 2020-02-01 DIAGNOSIS — Z7189 Other specified counseling: Secondary | ICD-10-CM

## 2020-02-01 DIAGNOSIS — Z853 Personal history of malignant neoplasm of breast: Secondary | ICD-10-CM | POA: Insufficient documentation

## 2020-02-01 DIAGNOSIS — Z9221 Personal history of antineoplastic chemotherapy: Secondary | ICD-10-CM | POA: Diagnosis not present

## 2020-02-01 MED ORDER — HYDROCODONE-ACETAMINOPHEN 5-325 MG PO TABS: 1.0000 | ORAL_TABLET | Freq: Four times a day (QID) | ORAL | 0 refills | Status: DC | PRN

## 2020-02-01 NOTE — Telephone Encounter (Signed)
Patient states that she is starting cancer treatment with Dr. Irene Limbo at [facility].  Dr. Irene Limbo would like to know about her current medical conditions.  His number is [phone].

## 2020-02-01 NOTE — Progress Notes (Signed)
HEMATOLOGY/ONCOLOGY CONSULTATION NOTE  Date of Service: 02/01/2020  Patient Care Team: [name], MD as PCP - General (Family Medicine) [name], MD as Attending Physician (Physical Medicine and Rehabilitation) [name], MD as Consulting Physician (Orthopedic Surgery) [name], MD as Consulting Physician (Otolaryngology) [name], MD as Consulting Physician (Gastroenterology) [name], MD as Consulting Physician (General Surgery) [name], MD as Consulting Physician (Hematology and Oncology) [name], MD as Consulting Physician (Radiation Oncology) [name], NP as Nurse Practitioner (Hematology and Oncology) [name], [facility] as Pharmacist (Pharmacist)  CHIEF COMPLAINTS/PURPOSE OF CONSULTATION:  Multiple Myeloma not having achieved remission  HISTORY OF PRESENTING ILLNESS:   Tracy [name] is a wonderful 74 y.o. female who has been referred to [place] by Dr [name] for evaluation and management of Multiple Myeloma. Pt is accompanied today by her daughter in person and other daughter and granddaughter via phone. The pt reports that she is doing well overall.   When pt was first diagnosed with Breast Cancer it was localized in both of her breasts and was considered Stage 1. She then received chemotherapy. However, she declined chemotherapy a second time and chose radiation therapy after recurrence. Pt has been under the surveillance of Dr. [name] after declining antiestrogen therapy.   The pt reports that she was having difficulty breathing so she saw her Pulmonologist, who ordered the work up. Based on the results of the PET/CT pt was sent to Dr. [name] as they were concerned that her breast cancer had spread. Pt has been becoming increasingly anemic over the last year, despite using a PO Iron supplement. She has had a positive Fecal Occult tests, but has been having issues with hemorrhoids and hemorrhoidal bleeding for the  last 5-6 months. Pt denies any blood in her stools but notices blood on the tissue after she wipes. Pt has also been having some discomfort in her left hip and lower back. This pain is worsened when she sits down.   Pt has had a chronic cough that has been thought to either be Bronchitis or a result of acid reflux. Pt is planning to receive an Endoscopy to work this up further. She is currently taking Gabapentin for tingling/burning in her extremities. Pt has Type II Diabetes and was previously using Metformin, but discontinued due to concern for liver damage. She is not currently using any medications to control her Diabetes.   Pt had a heart attack in 2001. She initially though that she had a [employer], until the sensation began to travel up her body. She was given Nitroglycerin upon admittance to the hospital. Pt did not require any stents or other interventions. No cause for her heart attack was ever discovered. Pt has been seen by Neurology, Dr. Leta Baptist, who completed imaging on her neck and found a degeneratve disk. Pt has not had a nerve study conducted. Pt has no history of Shingles and has not received the Shingles vaccine. She has been fully vaccinated against he COVID19 virus.    Of note prior to the patient's visit today, pt has had PET/CT ([id number]) completed on 01/20/2020 with results revealing "1. Hypermetabolism corresponding to left acetabular and L5 lytic lesions, with differential considerations of metastatic disease or myeloma. 2. No evidence of hypermetabolic soft tissue primary, metastatic disease, or soft tissue myeloma. 3. Incidental findings, including: Aortic atherosclerosis (ICD10-I70.0) and emphysema (ICD10-J43.9). Hepatic steatosis."   Pt has had Bone Marrow Surgical Pathology ([id number]) completed on 01/17/2020 which revealed "  BONE MARROW, ASPIRATE, CLOT, CORE: -Hypercellular bone marrow for age with plasma cell neoplasm PERIPHERAL BLOOD: -Normocytic-normochromic  anemia"  Pt has had Left Ischium Biopsy Surgical Pathology Report [id number]) completed on 01/17/2020 which revealed "Plasma cell neoplasm."  Most recent lab results (01/25/2020) of CBC is as follows: all values are WNL except for RBC at 2.44, Hgb at 7.5, HCT at 24.0, CO2 at 21, Glucose at 129, Creatinine at 1.38, Total Protein at 10.1, Albumin at 2.8, Total Bilirubin at <0.2, GFR Est Afr Am at 43. 01/25/2020 K/L light chains is as follows: Kappa free light chain at 22.7, Lamda free light chains at 17.7, K/L light chain ratio at 1.28 01/25/2020 MMP is as follows: all values are WNL except for IgG at 5220, Total Protein at 10.1, Alpha2 Glob at 1.2, Gamma Glob at 3.9, M Protein at 3.7, Total Globulin at 6.7, Albumin/Glob at 0.6. 01/25/2020 24-hr UPEP shows all values are WNL  On review of systems, pt reports lower back/left hip pain, unexpected weight loss, abdominal pain, loose stools and denies low appetite, constipation, rashes and any other symptoms.   On PMHx the pt reports Breast Cancer, Breast Lumpectomy, TIA, Restless leg, Myocardial infarction, HTN, HLD, Chronic Bronchitis.  MEDICAL HISTORY:  Past Medical History:  Diagnosis Date  . Anxiety   . Arthritis   . Breast cancer ([place]) 06/13/15  . Cancer ([place]) 2000   breast cancer  . Chronic bronchitis ([place])   . Chronic bronchitis ([place])   . Hyperlipidemia   . Hypertension   . Myocardial infarction ([place]) 2001  . Personal history of radiation therapy   . Restless leg   . Stroke [facility]) 2004   TIA, no deficits    SURGICAL HISTORY: Past Surgical History:  Procedure Laterality Date  . ABDOMINAL HYSTERECTOMY  1985  . BREAST LUMPECTOMY Left 2000   radiation and chemo  . BREAST LUMPECTOMY Right 2016   radiation  . BREAST SURGERY  2001   lt breast lumpectomy  . RADIOACTIVE SEED GUIDED PARTIAL MASTECTOMY WITH AXILLARY SENTINEL LYMPH NODE BIOPSY Right 07/07/2015   Procedure: RIGHT RADIOACTIVE SEED GUIDED PARTIAL MASTECTOMY WITH  AXILLARY SENTINEL LYMPH NODE BIOPSY;  Surgeon: Autumn Messing III, MD;  Location: [facility];  Service: General;  Laterality: Right;  . SMALL INTESTINE SURGERY    . TUBAL LIGATION      SOCIAL HISTORY: Social History   Socioeconomic History  . Marital status: Divorced    Spouse name: Not on file  . Number of children: 7  . Years of education: Not on file  . Highest education level: Not on file  Occupational History  . Occupation: retired  Tobacco Use  . Smoking status: Former Smoker    Packs/day: 1.00    Years: 20.00    Pack years: 20.00    Types: Cigarettes    Quit date: 07/30/2011    Years since quitting: 8.5  . Smokeless tobacco: Never Used  . Tobacco comment: Quit >4 years ago; 1 ppd for about 5/20 years (remaining was less)  Vaping Use  . Vaping Use: Former  Substance and Sexual Activity  . Alcohol use: No    Alcohol/week: 0.0 standard drinks  . Drug use: No  . Sexual activity: Not on file  Other Topics Concern  . Not on file  Social History Narrative   Lives alone.  Retired.  Education:  11th grade GED.  Children:  7 (one here).    Social Determinants of Health   Financial Resource Strain:   .  Difficulty of Paying Living Expenses:   Food Insecurity: No Food Insecurity  . Worried About Charity fundraiser in the Last Year: Never true  . Ran Out of Food in the Last Year: Never true  Transportation Needs: No Transportation Needs  . Lack of Transportation (Medical): No  . Lack of Transportation (Non-Medical): No  Physical Activity:   . Days of Exercise per Week:   . Minutes of Exercise per Session:   Stress:   . Feeling of Stress :   Social Connections:   . Frequency of Communication with Friends and Family:   . Frequency of Social Gatherings with Friends and Family:   . Attends Religious Services:   . Active Member of Clubs or Organizations:   . Attends Archivist Meetings:   Marland Kitchen Marital Status:   Intimate Partner Violence:   . Fear of  Current or Ex-Partner:   . Emotionally Abused:   Marland Kitchen Physically Abused:   . Sexually Abused:     FAMILY HISTORY: Family History  Problem Relation Age of Onset  . Diabetes Father   . Lung cancer Sister        dx. <50; former smoker  . Diabetes Brother   . Diabetes Paternal Aunt   . Stroke Maternal Grandmother   . Diabetes Paternal Grandmother   . Emphysema Mother 66       smoker  . Diabetes Brother   . Brain cancer Brother 57       unknown tumor type  . Cancer Daughter 31       neck cancer  . Other Daughter        hysterectomy for unspecified reason  . Breast cancer Cousin   . Cancer Cousin        unspecified type  . Breast cancer Other        triple negative breast cancer in her 76s  . Cancer Daughter   . Colon polyps Neg Hx   . Esophageal cancer Neg Hx   . Gallbladder disease Neg Hx     ALLERGIES:  is allergic to bacitracin-neomycin-polymyxin  [neomycin-bacitracin zn-polymyx], nsaids, tape, ambien [zolpidem tartrate], amoxicillin, contrast media [iodinated diagnostic agents], latex, and prednisone.  MEDICATIONS:  Current Outpatient Medications  Medication Sig Dispense Refill  . aspirin 81 MG tablet Take 81 mg by mouth daily.    Marland Kitchen atorvastatin (LIPITOR) 10 MG tablet TAKE 1 TABLET BY MOUTH  DAILY 90 tablet 3  . ferrous sulfate 325 (65 FE) MG tablet Take 1 tablet (325 mg total) by mouth daily with breakfast. 30 tablet 6  . gabapentin (NEURONTIN) 300 MG capsule Take 1 capsule (300 mg total) by mouth 3 (three) times daily. 270 capsule 1  . loratadine (CLARITIN) 10 MG tablet Take 10 mg by mouth daily.    Marland Kitchen losartan (COZAAR) 100 MG tablet TAKE 1 TABLET BY MOUTH  DAILY 90 tablet 3  . MELATONIN PO Take by mouth. (Patient not taking: Reported on 01/27/2020)    . pantoprazole (PROTONIX) 40 MG tablet Take 1 tablet (40 mg total) by mouth 2 (two) times daily. 60 tablet 3  . sertraline (ZOLOFT) 50 MG tablet TAKE 1 TABLET(50 MG) BY MOUTH DAILY 90 tablet 0  . tiZANidine (ZANAFLEX) 4 MG  tablet Take 1 tablet (4 mg total) by mouth at bedtime. 30 tablet 3  . traZODone (DESYREL) 100 MG tablet TAKE 1 TABLET BY MOUTH AT  BEDTIME 90 tablet 3   No current facility-administered medications for this visit.  REVIEW OF SYSTEMS:    10 Point review of Systems was done is negative except as noted above.  PHYSICAL EXAMINATION: ECOG PERFORMANCE STATUS: 2 - Symptomatic, <50% confined to bed  .There were no vitals filed for this visit. There were no vitals filed for this visit. .There is no height or weight on file to calculate BMI.  Exam was given in a wheel chair   GENERAL:alert, in no acute distress and comfortable SKIN: no acute rashes, no significant lesions EYES: conjunctiva are pink and non-injected, sclera anicteric OROPHARYNX: MMM, no exudates, no oropharyngeal erythema or ulceration NECK: supple, no JVD LYMPH:  no palpable lymphadenopathy in the cervical, axillary or inguinal regions LUNGS: clear to auscultation b/l with normal respiratory effort HEART: regular rate & rhythm ABDOMEN:  normoactive bowel sounds , non tender, not distended. Extremity: no pedal edema PSYCH: alert & oriented x 3 with fluent speech NEURO: no focal motor/sensory deficits  LABORATORY DATA:  I have reviewed the data as listed  . CBC Latest Ref Rng & Units 01/25/2020 01/17/2020 01/13/2020  WBC 4.0 - 10.5 K/uL 6.3 7.6 6.4  Hemoglobin 12.0 - 15.0 g/dL 7.5(L) 8.3(L) 7.9 Repeated and verified X2.(LL)  Hematocrit 36 - 46 % 24.0(L) 26.4(L) 23.2 Repeated and verified X2.(LL)  Platelets 150 - 400 K/uL 284 306 272.0    . CMP Latest Ref Rng & Units 01/25/2020 01/19/2020 01/13/2020  Glucose 70 - 99 mg/dL 129(H) 146(H) 100(H)  BUN 8 - 23 mg/dL '12 16 17  ' Creatinine 0.44 - 1.00 mg/dL 1.38(H) 1.19 1.31(H)  Sodium 135 - 145 mmol/L 137 131(L) 132(L)  Potassium 3.5 - 5.1 mmol/L 4.6 4.0 4.4  Chloride 98 - 111 mmol/L 109 101 104  CO2 22 - 32 mmol/L 21(L) 25 24  Calcium 8.9 - 10.3 mg/dL 8.9 8.7 8.6  Total  Protein 6.5 - 8.1 g/dL 10.1(H) - 10.2(H)  Total Bilirubin 0.3 - 1.2 mg/dL <0.2(L) - 0.2  Alkaline Phos 38 - 126 U/L 81 - 81  AST 15 - 41 U/L 15 - 12  ALT 0 - 44 U/L 16 - 11   01/25/2020 K/L light chains:    01/25/2020 MMP:            RADIOGRAPHIC STUDIES: I have personally reviewed the radiological images as listed and agreed with the findings in the report. CT ABDOMEN PELVIS WO CONTRAST  Result Date: 01/07/2020 CLINICAL DATA:  Cough. Gastroesophageal reflux disease. Poorly controlled acid reflux. Bilateral lumpectomy. Hysterectomy. Left breast cancer in 2000 with radiation therapy and chemotherapy completed. EXAM: CT ABDOMEN AND PELVIS WITHOUT CONTRAST TECHNIQUE: Multidetector CT imaging of the abdomen and pelvis was performed following the standard protocol without IV contrast. COMPARISON:  09/01/2014 FINDINGS: Lower chest: Clear lung bases. Normal heart size without pericardial or pleural effusion. Left mastectomy. Hepatobiliary: Mild hepatic steatosis. Normal gallbladder, without biliary ductal dilatation. Pancreas: Normal, without mass or ductal dilatation. Spleen: Normal in size, without focal abnormality. Adrenals/Urinary Tract: Normal adrenal glands. No renal calculi or hydronephrosis. No hydroureter or ureteric calculi. No bladder calculi. Stomach/Bowel: Proximal gastric underdistention. Scattered colonic diverticula. Normal terminal ileum and appendix. Normal small bowel. Vascular/Lymphatic: Aortic atherosclerosis. No abdominopelvic adenopathy. Reproductive: Hysterectomy.  No adnexal mass. Other: No significant free fluid.  Mild pelvic floor laxity. Musculoskeletal: Osteopenia. Dominant new lytic lesion within the left posterior acetabulum, including at 4.5 cm on 75/2. New lytic lesion within the L5 vertebral body, including on sagittal image 64 and coronal image 77. IMPRESSION: 1. No explanation for patient's symptoms. No acute  process in the abdomen or pelvis. 2. Development of  lytic lesions within the pelvis and L5 vertebral body. Given history of primary malignancy, most likely metastatic disease. Multiple myeloma could look similar. 3. Hepatic steatosis. 4.  Aortic Atherosclerosis (ICD10-I70.0). These results will be called to the ordering clinician or representative by the Radiologist Assistant, and communication documented in the PACS or [employer]. Electronically Signed   By: Abigail Miyamoto M.D.   On: 01/07/2020 10:02   NM PET Image Initial (PI) Skull Base To Thigh  Result Date: 01/20/2020 CLINICAL DATA:  Initial treatment strategy for lytic bone lesions. Cancer of unknown primary. COVID vaccine in right arm in May. EXAM: NUCLEAR MEDICINE PET SKULL BASE TO THIGH TECHNIQUE: 9.6 mCi F-18 FDG was injected intravenously. Full-ring PET imaging was performed from the skull base to thigh after the radiotracer. CT data was obtained and used for attenuation correction and anatomic localization. Fasting blood glucose: 121 mg/dl COMPARISON:  Abdominopelvic CT of 01/07/2020. FINDINGS: Mediastinal blood pool activity: SUV max 3.3 Liver activity: SUV max NA NECK: No areas of abnormal hypermetabolism. Incidental CT findings: No cervical adenopathy. CHEST: No pulmonary parenchymal or thoracic nodal hypermetabolism. Incidental CT findings: Aortic atherosclerosis. Mild cardiomegaly. Right axillary node dissection. Moderate centrilobular emphysema. ABDOMEN/PELVIS: No abdominopelvic parenchymal or nodal hypermetabolism. Incidental CT findings: Deferred to abdominopelvic CT. Hepatic steatosis. Normal adrenal glands. Abdominal aortic atherosclerosis. Contrast within the colon related to prior esophagram. SKELETON: Hypermetabolism corresponding to the posterior left acetabular lesion. 3.8 cm and a S.U.V. max of 5.5 on 169/4. Eccentric right L5 lesion measures 2.9 cm and a S.U.V. max of 4.6 on 137/4. Right humeral head hypermetabolism without well-defined CT correlate. On the order of a S.U.V. max  of 4.1. Incidental CT findings: Right-sided surgical changes of lumpectomy with skin thickening, likely radiation induced. IMPRESSION: 1. Hypermetabolism corresponding to left acetabular and L5 lytic lesions, with differential considerations of metastatic disease or myeloma. 2. No evidence of hypermetabolic soft tissue primary, metastatic disease, or soft tissue myeloma. 3. Incidental findings, including: Aortic atherosclerosis (ICD10-I70.0) and emphysema (ICD10-J43.9). Hepatic steatosis. Electronically Signed   By: Abigail Miyamoto M.D.   On: 01/20/2020 12:48   CT BIOPSY  Result Date: 01/17/2020 INDICATION: 74 year old with lytic bone lesions and with history of breast cancer. Evaluate for metastatic disease versus myeloma. EXAM: CT GUIDED BONE MARROW ASPIRATES AND BIOPSY CT-GUIDED LEFT PELVIC BONE LESION BIOPSY Physician: Stephan Minister. Anselm Pancoast, MD MEDICATIONS: None. ANESTHESIA/SEDATION: Fentanyl 4.0 mcg IV; Versed 100 mg IV Moderate Sedation Time:  40 minutes The patient was continuously monitored during the procedure by the interventional radiology nurse under my direct supervision. COMPLICATIONS: None immediate. PROCEDURE: The procedure was explained to the patient. The risks and benefits of the procedure were discussed and the patient's questions were addressed. Informed consent was obtained from the patient. The patient was placed prone on CT table. Images of the pelvis were obtained. The right side of back was prepped and draped in sterile fashion. The skin and right posterior ilium were anesthetized with 1% lidocaine. 11 gauge bone needle was directed into the right ilium with CT guidance. Two aspirates and two core biopsies were obtained. Bandage placed over the puncture site. Attention was directed to the left pelvic lytic bone lesion. Additional CT images were obtained. Left side of the pelvis was prepped with chlorhexidine and sterile field was created. Skin was anesthetized with 1% lidocaine. Using CT guidance,  a 17 gauge coaxial needle was directed through the posterior cortex of the left ischium  into the lytic lesion. Total of 3 core biopsies were obtained with an 18 gauge BioPince core device. Specimens placed in formalin. 17 gauge needle was removed without complication. Bandage placed over the puncture site. FINDINGS: Bone marrow biopsy needle directed into the posterior right ilium. Lucent lesion involving the left ischium with cortical destruction along the medial aspect. Biopsy needle was directed into the lucent lesion. Soft tissue core biopsies placed in formalin. Residual oral contrast within the rectosigmoid region from the diagnostic CT on 01/07/2020. IMPRESSION: CT guided bone marrow aspiration and core biopsy. CT-guided core biopsy of left pelvic lytic lesion. Electronically Signed   By: Markus Daft M.D.   On: 01/17/2020 11:47   CT Biopsy  Result Date: 01/17/2020 INDICATION: 74 year old with lytic bone lesions and with history of breast cancer. Evaluate for metastatic disease versus myeloma. EXAM: CT GUIDED BONE MARROW ASPIRATES AND BIOPSY CT-GUIDED LEFT PELVIC BONE LESION BIOPSY Physician: Stephan Minister. Anselm Pancoast, MD MEDICATIONS: None. ANESTHESIA/SEDATION: Fentanyl 4.0 mcg IV; Versed 100 mg IV Moderate Sedation Time:  40 minutes The patient was continuously monitored during the procedure by the interventional radiology nurse under my direct supervision. COMPLICATIONS: None immediate. PROCEDURE: The procedure was explained to the patient. The risks and benefits of the procedure were discussed and the patient's questions were addressed. Informed consent was obtained from the patient. The patient was placed prone on CT table. Images of the pelvis were obtained. The right side of back was prepped and draped in sterile fashion. The skin and right posterior ilium were anesthetized with 1% lidocaine. 11 gauge bone needle was directed into the right ilium with CT guidance. Two aspirates and two core biopsies were obtained.  Bandage placed over the puncture site. Attention was directed to the left pelvic lytic bone lesion. Additional CT images were obtained. Left side of the pelvis was prepped with chlorhexidine and sterile field was created. Skin was anesthetized with 1% lidocaine. Using CT guidance, a 17 gauge coaxial needle was directed through the posterior cortex of the left ischium into the lytic lesion. Total of 3 core biopsies were obtained with an 18 gauge BioPince core device. Specimens placed in formalin. 17 gauge needle was removed without complication. Bandage placed over the puncture site. FINDINGS: Bone marrow biopsy needle directed into the posterior right ilium. Lucent lesion involving the left ischium with cortical destruction along the medial aspect. Biopsy needle was directed into the lucent lesion. Soft tissue core biopsies placed in formalin. Residual oral contrast within the rectosigmoid region from the diagnostic CT on 01/07/2020. IMPRESSION: CT guided bone marrow aspiration and core biopsy. CT-guided core biopsy of left pelvic lytic lesion. Electronically Signed   By: Markus Daft M.D.   On: 01/17/2020 11:47   CT BONE MARROW BIOPSY & ASPIRATION  Result Date: 01/17/2020 INDICATION: 74 year old with lytic bone lesions and with history of breast cancer. Evaluate for metastatic disease versus myeloma. EXAM: CT GUIDED BONE MARROW ASPIRATES AND BIOPSY CT-GUIDED LEFT PELVIC BONE LESION BIOPSY Physician: Stephan Minister. Anselm Pancoast, MD MEDICATIONS: None. ANESTHESIA/SEDATION: Fentanyl 4.0 mcg IV; Versed 100 mg IV Moderate Sedation Time:  40 minutes The patient was continuously monitored during the procedure by the interventional radiology nurse under my direct supervision. COMPLICATIONS: None immediate. PROCEDURE: The procedure was explained to the patient. The risks and benefits of the procedure were discussed and the patient's questions were addressed. Informed consent was obtained from the patient. The patient was placed prone on  CT table. Images of the pelvis were obtained. The right side  of back was prepped and draped in sterile fashion. The skin and right posterior ilium were anesthetized with 1% lidocaine. 11 gauge bone needle was directed into the right ilium with CT guidance. Two aspirates and two core biopsies were obtained. Bandage placed over the puncture site. Attention was directed to the left pelvic lytic bone lesion. Additional CT images were obtained. Left side of the pelvis was prepped with chlorhexidine and sterile field was created. Skin was anesthetized with 1% lidocaine. Using CT guidance, a 17 gauge coaxial needle was directed through the posterior cortex of the left ischium into the lytic lesion. Total of 3 core biopsies were obtained with an 18 gauge BioPince core device. Specimens placed in formalin. 17 gauge needle was removed without complication. Bandage placed over the puncture site. FINDINGS: Bone marrow biopsy needle directed into the posterior right ilium. Lucent lesion involving the left ischium with cortical destruction along the medial aspect. Biopsy needle was directed into the lucent lesion. Soft tissue core biopsies placed in formalin. Residual oral contrast within the rectosigmoid region from the diagnostic CT on 01/07/2020. IMPRESSION: CT guided bone marrow aspiration and core biopsy. CT-guided core biopsy of left pelvic lytic lesion. Electronically Signed   By: Markus Daft M.D.   On: 01/17/2020 11:47   DG ESOPHAGUS W DOUBLE CM (HD)  Result Date: 01/07/2020 CLINICAL DATA:  Gastroesophageal reflux disease. Shortness of breath. Reflux. Nausea. Dysphagia. EXAM: ESOPHOGRAM / BARIUM SWALLOW / BARIUM TABLET STUDY TECHNIQUE: Combined double contrast and single contrast examination performed using effervescent crystals, thick barium liquid, and thin barium liquid. The patient was observed with fluoroscopy swallowing a 13 mm barium sulphate tablet. FLUOROSCOPY TIME:  Fluoroscopy Time:  2 minutes and 22 seconds  Radiation Exposure Index (if provided by the fluoroscopic device): Not applicable. Number of Acquired Spot Images: 0 COMPARISON:  today's CT, dictated separately. Chest radiograph 10/12/2018 FINDINGS: Hypopharyngeal portion of the exam is unremarkable. Double contrast evaluation of the esophagus demonstrates no mucosal abnormality. Evaluation of esophageal motility demonstrates proximal escape waves in the distal esophagus with contrast stasis in the mid and lower esophagus. Full column evaluation of the esophagus demonstrates no persistent narrowing or stricture. A 13 mm barium tablet passes promptly. IMPRESSION: 1. Esophageal dysmotility, likely presbyesophagus. 2. No evidence of esophageal stricture or anatomic explanation for patient's symptoms. Electronically Signed   By: Abigail Miyamoto M.D.   On: 01/07/2020 10:08    ASSESSMENT & PLAN:   74 yo with   1) Newly diagnosed IgG Kappa Multiple myeloma with bone lesions, anemia, renal insuff. M spike @ 3.7g/dl 2) h/o Dm2 3) Diabetic Neuropathy 4) CKD - likely from DM2, but could have an element of myeloma kidney. 5) h/o TIA and AMI PLAN: -Discussed patient's most recent labs from 01/25/2020, all values are WNL except for RBC at 2.44, Hgb at 7.5, HCT at 24.0, CO2 at 21, Glucose at 129,  Creatinine at 1.38, Total Protein at 10.1, Albumin at 2.8, Total Bilirubin at <0.2, GFR Est Afr Am at 43. -Discussed 01/25/2020 K/L light chains: Kappa free light chain at 22.7, Lamda free light chains at 17.7, K/L light chain ratio at 1.28 -Discussed 01/25/2020 MMP is as follows: all values are WNL except for IgG at 5220, Total Protein at 10.1, Alpha2 Glob at 1.2, Gamma Glob at 3.9, M Protein at 3.7, Total Globulin at 6.7, Albumin/Glob at 0.6. -Discussed 01/25/2020 24-hr UPEP shows all values are WNL -Discussed 01/20/2020 PET/CT ([id number]) which revealed "1. Hypermetabolism corresponding to left acetabular and L5  lytic lesions, with differential considerations of  metastatic disease or myeloma. 2. No evidence of hypermetabolic soft tissue primary, metastatic disease, or soft tissue myeloma." -Discussed 01/17/2020 Bone Marrow Surgical Pathology [phone]) which revealed "Hypercellular bone marrow for age with plasma cell neoplasm" -Discussed 01/17/2020 Left Ischium Biopsy Surgical Pathology Report [id number]) which revealed "Plasma cell neoplasm." -Discussed CRAB criteria: no hypercalcemia, minor renal dysfunction that can be otherwise explained, significant anemia, bone lesions identified on scan  -Advised pt that genetics, ability to tolerate treatment, comorbidities, & treatment response will determine treatment choice -Advised pt that she has 1p deletion and a polymorphic variant which suggest a more aggressive disease and 13q deletion which suggest a less agressive form of disease.  -Advised pt that steroids used with treatment will increase her blood glucose levels. She must work with her PCP or Endocrinologist to tightly control her blood glucose levels.  -Advised pt that Immunomodulators increase the risk of blood clots, heart attacks, and strokes; proteasome inhibitors can worsen neuropathy. These will need to be factored in during treatment considerations.  -Advised pt that we will also have to begin bisphosphonates to strengthen bones. Pt must be cleared by a dentist prior to starting this medication.  -Recommend pt avoid bending, twisting, lifting, pushing, or pulling anything heavy  -Will begin pt on Daratumumab + Dexamethasone in 7-10 days  -Will refer pt to Radiation Oncology for RT of symptomatic lesions -Will set up chemo counseling ASAP -Advised pt okay to use Tylenol prn for pain control  -Recommend pt f/u with PCP for coordination of care  -Recommend pt f/u with Dentist for dental clearance -Rx Narco -Will see back in 7-10 days with labs     FOLLOW UP: Chemo-education for Daratumumab + Dexamethasone ASAP Plz schedule to  start Daratumumab/Dexamethasone in 7-10 days with labs and MD visit Referral to radiation oncology for consideration of palliative RT for symptomatic myeloma lesions at L5 and acetabular metastases.   All of the patients questions were answered with apparent satisfaction. The patient knows to call the clinic with any problems, questions or concerns.  I spent 93mns counseling the patient face to face. The total time spent in the appointment was 60 minutes and more than 50% was on counseling and direct patient cares.    GSullivan LoneMD MMaple GroveAAHIVMS SSaint [facility] Hospital SouthCMer[facility] Hospital And Medical CenterHematology/Oncology Physician CMer[facility] (Office):       [phone](Work cell):  [phone](Fax):           [phone] 02/01/2020 9:02 AM  I, JYevette Edwards am acting as a scribe for Dr. GSullivan Lone   .I have reviewed the above documentation for accuracy and completeness, and I agree with the above. .Brunetta GeneraMD

## 2020-02-02 NOTE — Telephone Encounter (Signed)
Please let provider know PCP out of office until next Monday. Also medical problems, notes, etc should be listed in EPIC

## 2020-02-02 NOTE — Telephone Encounter (Signed)
FYI:  Spoke with Lovey Newcomer, Dr. Grier Mitts nurse. She thinks the message was just a miscommunication. She states she will see if there was anything specific that Dr. Irene Limbo needed that he couldn't see through Epic, but either way she will let me know.

## 2020-02-02 NOTE — Telephone Encounter (Signed)
Patient was seen in the office on 01/27/2020 by Dr. Carlis Abbott and everything was discussed in detail. Patient expressed understanding. Nothing further needed at this time.

## 2020-02-02 NOTE — Telephone Encounter (Signed)
Please advise 

## 2020-02-02 NOTE — Telephone Encounter (Signed)
Sandy with Dr. Grier Mitts office called back. It was miscommunication. The patient was just supposed to let you know that her treatment will involve steroids which can elevate her blood sugar and medications may need to be adjusted. Dr. Irene Limbo just wanted you to be aware in case that happens.

## 2020-02-03 ENCOUNTER — Inpatient Hospital Stay: Payer: Medicare Other

## 2020-02-03 ENCOUNTER — Other Ambulatory Visit: Payer: Self-pay

## 2020-02-04 ENCOUNTER — Telehealth: Payer: Self-pay | Admitting: Hematology

## 2020-02-04 ENCOUNTER — Telehealth: Payer: Self-pay | Admitting: *Deleted

## 2020-02-04 NOTE — Telephone Encounter (Signed)
Scheduled per 07/06 los, patient has been called and notified of upcoming appointments. 

## 2020-02-07 DIAGNOSIS — Z7189 Other specified counseling: Secondary | ICD-10-CM | POA: Insufficient documentation

## 2020-02-07 MED ORDER — PROCHLORPERAZINE MALEATE 10 MG PO TABS: 10.0000 mg | ORAL_TABLET | Freq: Four times a day (QID) | ORAL | 1 refills | Status: DC | PRN

## 2020-02-07 MED ORDER — ACYCLOVIR 400 MG PO TABS: 400.0000 mg | ORAL_TABLET | Freq: Two times a day (BID) | ORAL | 11 refills | Status: DC

## 2020-02-07 MED ORDER — DEXAMETHASONE 4 MG PO TABS: ORAL_TABLET | ORAL | 4 refills | Status: DC

## 2020-02-07 MED ORDER — ONDANSETRON HCL 8 MG PO TABS: 8.0000 mg | ORAL_TABLET | Freq: Two times a day (BID) | ORAL | 1 refills | Status: DC | PRN

## 2020-02-07 MED ORDER — LORAZEPAM 0.5 MG PO TABS: 0.5000 mg | ORAL_TABLET | Freq: Four times a day (QID) | ORAL | 0 refills | Status: DC | PRN

## 2020-02-07 NOTE — Progress Notes (Signed)
Patient on plan of care prior to pathways. 

## 2020-02-07 NOTE — Progress Notes (Signed)
START ON PATHWAY REGIMEN - Multiple Myeloma and Other Plasma Cell Dyscrasias   DaraVRd (Daratumumab IV + Bortezomib SUBQ + Lenalidomide PO + Dexamethasone IV/PO) q21 Days (Induction Schema):   A cycle is every 21 days:     Lenalidomide      Dexamethasone      Bortezomib      Daratumumab   **Always confirm dose/schedule in your pharmacy ordering system**  DaraVRd (Daratumumab IV + Bortezomib SUBQ + Lenalidomide PO + Dexamethasone IV/PO) q21 Days (Consolidation Schema):   A cycle is every 21 days:     Lenalidomide      Dexamethasone      Bortezomib      Daratumumab   **Always confirm dose/schedule in your pharmacy ordering system**  Patient Characteristics: Multiple Myeloma, Newly Diagnosed, Transplant Eligible, High Risk Disease Classification: Multiple Myeloma R-ISS Staging: Unknown Therapeutic Status: Newly Diagnosed Is Patient Eligible for Transplant<= Transplant Eligible Risk Status: High Risk Intent of Therapy: Non-Curative / Palliative Intent, Discussed with Patient

## 2020-02-08 ENCOUNTER — Encounter: Payer: Self-pay | Admitting: Gastroenterology

## 2020-02-08 ENCOUNTER — Ambulatory Visit: Payer: Medicare Other

## 2020-02-08 VITALS — BP 145/63

## 2020-02-08 DIAGNOSIS — E785 Hyperlipidemia, unspecified: Secondary | ICD-10-CM

## 2020-02-08 DIAGNOSIS — I10 Essential (primary) hypertension: Secondary | ICD-10-CM

## 2020-02-08 DIAGNOSIS — E1140 Type 2 diabetes mellitus with diabetic neuropathy, unspecified: Secondary | ICD-10-CM

## 2020-02-08 NOTE — Progress Notes (Signed)
Chronic Care Management Pharmacy  Name: Tracy [name]  MRN: [id number] DOB: [birth year]  Chief Complaint/ HPI  Tracy [name],  74 y.o. , female presents for their Initial CCM visit with the clinical pharmacist via telephone due to COVID-19 Pandemic.  New diagnosis of multiple mylenoma within last month, seen by Dr. Irene Limbo.   Is happy with recent a1c reduction, reports continuing to limit carbs and cutting out sugar.  Reports burning sensation last night, is not wanting to be seen at this time.  Previously elevated BP at 7/6 visit, today at home BP is 145/63. Reports some difficulty in remembering to take losartan.   PCP : [name], MD  Their chronic conditions include: Anxiety, HLD, HTN, MI, TIA, RLS.  Office Visits: 02/01/2020 (Dr. Irene Limbo): Seen for new diagnosis of multiple mylenoma. 7-10 days from visit will begin daratumumab + dexamethasone, rx narco, will begin bisphos, palliative RT. Will need to see dentist after starting bisphosphonate.  11/24/2019 ([place]): started sertraline taper, counseled on metformin and recommended f/u on DM labs. 11/03/2019 (PCP): ongoing reflux symptoms, omeprazole 40 mg switched to pantoprazole 40 mg  08/20/2019 (PCP): A1c increased to 8.4 from 6.6 in 10 months, started on metformin 500 mg twice daily. Hgb lower to 9.3, encouraged to take iron supplement.   Patient Active Problem List   Diagnosis Date Noted  . Counseling regarding advance care planning and goals of care 02/07/2020  . Multiple myeloma ([place]) 01/25/2020  . Allergic rhinitis 08/19/2018  . Type 2 diabetes mellitus with diabetic neuropathy, unspecified ([place]) 11/05/2017  . Encounter for long-term use of muscle relaxants 09/24/2017  . CAD in native artery 09/24/2017  . OSA (obstructive sleep apnea) 09/24/2017  . Snorings 09/24/2017  . Morbid obesity ([place]) 06/02/2017  . Depression 06/02/2017  . Iron deficiency anemia 09/23/2016  . Anemia of chronic disease 09/28/2015  . Genetic  testing 08/21/2015  . History of left breast cancer 07/13/2015  . History of right breast cancer 06/20/2015  . Hearing loss due to cerumen impaction 12/29/2014  . Allergy to adhesive tape 05/19/2014  . Hyperlipidemia 12/06/2013  . GERD (gastroesophageal reflux disease) 12/06/2013  . Cervical disc disease 11/11/2013  . Osteopenia 05/25/2013  . Allergic asthma 12/24/2012  . Insomnia 04/02/2012  . Physical exam, annual 04/02/2012  . HTN (hypertension) 02/03/2012  . Vertigo, benign positional 02/03/2012  . Left groin pain 02/03/2012   Social History   Socioeconomic History  . Marital status: Divorced    Spouse name: Not on file  . Number of children: 7  . Years of education: Not on file  . Highest education level: Not on file  Occupational History  . Occupation: retired  Tobacco Use  . Smoking status: Former Smoker    Packs/day: 1.00    Years: 20.00    Pack years: 20.00    Types: Cigarettes    Quit date: 07/30/2011    Years since quitting: 8.5  . Smokeless tobacco: Never Used  . Tobacco comment: Quit >4 years ago; 1 ppd for about 5/20 years (remaining was less)  Vaping Use  . Vaping Use: Former  Substance and Sexual Activity  . Alcohol use: No    Alcohol/week: 0.0 standard drinks  . Drug use: No  . Sexual activity: Not on file  Other Topics Concern  . Not on file  Social History Narrative   Lives alone.  Retired.  Education:  11th grade GED.  Children:  7 (one here).    Social Determinants of  Health   Financial Resource Strain:   . Difficulty of Paying Living Expenses:   Food Insecurity: No Food Insecurity  . Worried About Charity fundraiser in the Last Year: Never true  . Ran Out of Food in the Last Year: Never true  Transportation Needs: No Transportation Needs  . Lack of Transportation (Medical): No  . Lack of Transportation (Non-Medical): No  Physical Activity:   . Days of Exercise per Week:   . Minutes of Exercise per Session:   Stress:   . Feeling of  Stress :   Social Connections:   . Frequency of Communication with Friends and Family:   . Frequency of Social Gatherings with Friends and Family:   . Attends Religious Services:   . Active Member of Clubs or Organizations:   . Attends Archivist Meetings:   Marland Kitchen Marital Status:    Family History  Problem Relation Age of Onset  . Diabetes Father   . Lung cancer Sister        dx. <50; former smoker  . Diabetes Brother   . Diabetes Paternal Aunt   . Stroke Maternal Grandmother   . Diabetes Paternal Grandmother   . Emphysema Mother 57       smoker  . Diabetes Brother   . Brain cancer Brother 81       unknown tumor type  . Cancer Daughter 96       neck cancer  . Other Daughter        hysterectomy for unspecified reason  . Breast cancer Cousin   . Cancer Cousin        unspecified type  . Breast cancer Other        triple negative breast cancer in her 66s  . Cancer Daughter   . Colon polyps Neg Hx   . Esophageal cancer Neg Hx   . Gallbladder disease Neg Hx    Allergies  Allergen Reactions  . Bacitracin-Neomycin-Polymyxin  [Neomycin-Bacitracin Zn-Polymyx] Swelling  . Nsaids Other (See Comments)    nosebleeds  . Tape Hives    Pt cannot tolerate bandaids, tape, or any other adhesives.   . Ambien [Zolpidem Tartrate]     Hives   . Amoxicillin     Rash   . Contrast Media [Iodinated Diagnostic Agents] Hives    Pt states hives with prior ct, was given benadryl to resolve  . Latex Swelling  . Prednisone    Outpatient Encounter Medications as of 02/08/2020  Medication Sig  . acyclovir (ZOVIRAX) 400 MG tablet Take 1 tablet (400 mg total) by mouth 2 (two) times daily.  Marland Kitchen aspirin 81 MG tablet Take 81 mg by mouth daily.  Marland Kitchen atorvastatin (LIPITOR) 10 MG tablet TAKE 1 TABLET BY MOUTH  DAILY  . dexamethasone (DECADRON) 4 MG tablet Take 3 tabs with breakfast the day after each treatment.  . ferrous sulfate 325 (65 FE) MG tablet Take 1 tablet (325 mg total) by mouth daily  with breakfast.  . gabapentin (NEURONTIN) 300 MG capsule Take 1 capsule (300 mg total) by mouth 3 (three) times daily.  Marland Kitchen HYDROcodone-acetaminophen (NORCO) 5-325 MG tablet Take 1 tablet by mouth every 6 (six) hours as needed for moderate pain.  Marland Kitchen loratadine (CLARITIN) 10 MG tablet Take 10 mg by mouth daily.  Marland Kitchen LORazepam (ATIVAN) 0.5 MG tablet Take 1 tablet (0.5 mg total) by mouth every 6 (six) hours as needed (Nausea or vomiting).  Marland Kitchen losartan (COZAAR) 100 MG tablet TAKE 1 TABLET  BY MOUTH  DAILY  . MELATONIN PO Take by mouth. (Patient not taking: Reported on 01/27/2020)  . ondansetron (ZOFRAN) 8 MG tablet Take 1 tablet (8 mg total) by mouth 2 (two) times daily as needed (Nausea or vomiting).  . pantoprazole (PROTONIX) 40 MG tablet Take 1 tablet (40 mg total) by mouth 2 (two) times daily.  . prochlorperazine (COMPAZINE) 10 MG tablet Take 1 tablet (10 mg total) by mouth every 6 (six) hours as needed (Nausea or vomiting).  . sertraline (ZOLOFT) 50 MG tablet TAKE 1 TABLET(50 MG) BY MOUTH DAILY  . tiZANidine (ZANAFLEX) 4 MG tablet Take 1 tablet (4 mg total) by mouth at bedtime.  . traZODone (DESYREL) 100 MG tablet TAKE 1 TABLET BY MOUTH AT  BEDTIME   No facility-administered encounter medications on file as of 02/08/2020.    Hypertension   BP today is: 145/63  BP Readings from Last 3 Encounters:  02/08/20 (!) 145/63  02/01/20 (!) 170/58  01/27/20 128/60   Patient checks BP at home daily.   We discussed diet and exercise extensively, reviewed home BP monitoring recommendations.Counseled on proper administration of losartan.     Patient is currently controlled on the following medications:  . Losartan 100 mg daily   Plan  Continue current medications and control with diet and exercise.    Diabetes   Recent Relevant Labs: Lab Results  Component Value Date/Time   HGBA1C 7.0 (H) 01/13/2020 01:52 PM   HGBA1C 8.4 (H) 08/20/2019 10:44 AM   EGFR 71 (L) 07/13/2015 03:16 PM    Improvement in  a1c from 8.4% (07/2019) to 12/2019. Metformin stopped due to concern with liver damage.   We discussed: low carb diet, sugar reduction.   Plan Continue current medications and control with diet and exercise.    Hyperlipidemia   LDL goal < 70  Lipid Panel     Component Value Date/Time   CHOL 127 01/13/2020 1352   TRIG 129.0 01/13/2020 1352   HDL 44.00 01/13/2020 1352   LDLCALC 58 01/13/2020 1352   LDLDIRECT 154.7 04/05/2013 0906    Hepatic Function Latest Ref Rng & Units 01/25/2020 01/13/2020 08/20/2019  Total Protein 6.5 - 8.1 g/dL 10.1(H) 10.2(H) 8.8(H)  Albumin 3.5 - 5.0 g/dL 2.8(L) 3.5 3.6  AST 15 - 41 U/L '15 12 16  ' ALT 0 - 44 U/L '16 11 13  ' Alk Phosphatase 38 - 126 U/L 81 81 76  Total Bilirubin 0.3 - 1.2 mg/dL <0.2(L) 0.2 0.2  Bilirubin, Direct 0.0 - 0.3 mg/dL - 0.5(H) 0.2    The ASCVD Risk score Mikey Bussing DC Jr., et al., 2013) failed to calculate for the following reasons:   The valid total cholesterol range is 130 to 320 mg/dL   Denies any muscle or abdominal pain or n/v. Patient is currently controlled on the following medications:  . Atorvastatin 10 mg daily   We discussed:  diet and exercise extensively  Plan  Continue current medications and control with diet and exercise  Anxiety / depression   PHQ 2/9:1. Still breaking sertraline in half as of 02/08/2020. Reports some recent anxiety with new diagnosis. Discussed sertraline 50 mg taper at previous visits - previous plan was to take half tablet (25 mg) daily for 2 weeks then stop taking sertraline.  States she will continue to take half until gone.   Plan Take half of sertraline 50 mg tablet (25 mg) until gone.     GERD   Acid reflux is ongoing issue for patient.  Has recently switched from omeprazole 40 mg to pantoprazole 40 mg. Still experiencing symptoms of reflux/cough and was originally set up to see pulm 12/01/2019.  Plan  Continue current medication.  Medication Management   Receives prescription  medications from:  [facility], [place] - [address] AT [place] [address] Phone: [phone] Fax: [facility], [place] [facility] [place], [address] [place], [place] [address] [place] [address] Phone: [phone] Fax: [phone]  [facility] [phone] @ [place], [place] [address] Phone: [phone] Fax: [phone]    Denies any issues with medication management.   Plan  Continue current medication management strategy.  Follow up: 3 month phone visit.  BP, DM, GERD, anxiety/dep  [name], Pharm.D. Clinical Pharmacist [facility] [phone]

## 2020-02-08 NOTE — Patient Instructions (Signed)
Please continue with daily BP monitoring at home to ensure your readings are controlled. Today's reading was much improved from last week. Let us know if blood pressure readings are consistently above 140/90 or if taking losartan daily becomes more of an issue. Call me at [phone] (direct line) with any questions - thank you!  - Edyth Gunnels., Clinical Pharmacist  The patient verbalized understanding of instructions provided today and agreed to receive a mailed copy of patient instruction and/or educational materials. Telephone follow up appointment with pharmacy team member scheduled for: 3 month follow-up visit  [name], Pharm.D., BCGP Clinical Pharmacist [facility] [phone]  Hypertension, Adult High blood pressure (hypertension) is when the force of blood pumping through the arteries is too strong. The arteries are the blood vessels that carry blood from the heart throughout the body. Hypertension forces the heart to work harder to pump blood and may cause arteries to become narrow or stiff. Untreated or uncontrolled hypertension can cause a heart attack, heart failure, a stroke, kidney disease, and other problems. A blood pressure reading consists of a higher number over a lower number. Ideally, your blood pressure should be below 120/80. The first ("top") number is called the systolic pressure. It is a measure of the pressure in your arteries as your heart beats. The second ("bottom") number is called the diastolic pressure. It is a measure of the pressure in your arteries as the heart relaxes. What are the causes? The exact cause of this condition is not known. There are some conditions that result in or are related to high blood pressure. What increases the risk? Some risk factors for high blood pressure are under your control. The following factors may make you more likely to develop this condition:  Smoking.  Having type 2 diabetes mellitus,  high cholesterol, or both.  Not getting enough exercise or physical activity.  Being overweight.  Having too much fat, sugar, calories, or salt (sodium) in your diet.  Drinking too much alcohol. Some risk factors for high blood pressure may be difficult or impossible to change. Some of these factors include:  Having chronic kidney disease.  Having a family history of high blood pressure.  Age. Risk increases with age.  Race. You may be at higher risk if you are African American.  Gender. Men are at higher risk than women before age 40. After age 29, women are at higher risk than men.  Having obstructive sleep apnea.  Stress. What are the signs or symptoms? High blood pressure may not cause symptoms. Very high blood pressure (hypertensive crisis) may cause:  Headache.  Anxiety.  Shortness of breath.  Nosebleed.  Nausea and vomiting.  Vision changes.  Severe chest pain.  Seizures. How is this diagnosed? This condition is diagnosed by measuring your blood pressure while you are seated, with your arm resting on a flat surface, your legs uncrossed, and your feet flat on the floor. The cuff of the blood pressure monitor will be placed directly against the skin of your upper arm at the level of your heart. It should be measured at least twice using the same arm. Certain conditions can cause a difference in blood pressure between your right and left arms. Certain factors can cause blood pressure readings to be lower or higher than normal for a short period of time:  When your blood pressure is higher when you are in a health care provider's office than when you are at home, this  is called white coat hypertension. Most people with this condition do not need medicines.  When your blood pressure is higher at home than when you are in a health care provider's office, this is called masked hypertension. Most people with this condition may need medicines to control blood  pressure. If you have a high blood pressure reading during one visit or you have normal blood pressure with other risk factors, you may be asked to:  Return on a different day to have your blood pressure checked again.  Monitor your blood pressure at home for 1 week or longer. If you are diagnosed with hypertension, you may have other blood or imaging tests to help your health care provider understand your overall risk for other conditions. How is this treated? This condition is treated by making healthy lifestyle changes, such as eating healthy foods, exercising more, and reducing your alcohol intake. Your health care provider may prescribe medicine if lifestyle changes are not enough to get your blood pressure under control, and if:  Your systolic blood pressure is above 130.  Your diastolic blood pressure is above 80. Your personal target blood pressure may vary depending on your medical conditions, your age, and other factors. Follow these instructions at home: Eating and drinking   Eat a diet that is high in fiber and potassium, and low in sodium, added sugar, and fat. An example eating plan is called the DASH (Dietary Approaches to Stop Hypertension) diet. To eat this way: ? Eat plenty of fresh fruits and vegetables. Try to fill one half of your plate at each meal with fruits and vegetables. ? Eat whole grains, such as whole-wheat pasta, brown rice, or whole-grain bread. Fill about one fourth of your plate with whole grains. ? Eat or drink low-fat dairy products, such as skim milk or low-fat yogurt. ? Avoid fatty cuts of meat, processed or cured meats, and poultry with skin. Fill about one fourth of your plate with lean proteins, such as fish, chicken without skin, beans, eggs, or tofu. ? Avoid pre-made and processed foods. These tend to be higher in sodium, added sugar, and fat.  Reduce your daily sodium intake. Most people with hypertension should eat less than 1,500 mg of sodium a  day.  Do not drink alcohol if: ? Your health care provider tells you not to drink. ? You are pregnant, may be pregnant, or are planning to become pregnant.  If you drink alcohol: ? Limit how much you use to:  0-1 drink a day for women.  0-2 drinks a day for men. ? Be aware of how much alcohol is in your drink. In the [place] one drink equals one 12 oz bottle of beer (355 mL), one 5 oz glass of wine (148 mL), or one 1 oz glass of hard liquor (44 mL). Lifestyle   Work with your health care provider to maintain a healthy body weight or to lose weight. Ask what an ideal weight is for you.  Get at least 30 minutes of exercise most days of the week. Activities may include walking, swimming, or biking.  Include exercise to strengthen your muscles (resistance exercise), such as Pilates or lifting weights, as part of your weekly exercise routine. Try to do these types of exercises for 30 minutes at least 3 days a week.  Do not use any products that contain nicotine or tobacco, such as cigarettes, e-cigarettes, and chewing tobacco. If you need help quitting, ask your health care provider.  Monitor  your blood pressure at home as told by your health care provider.  Keep all follow-up visits as told by your health care provider. This is important. Medicines  Take over-the-counter and prescription medicines only as told by your health care provider. Follow directions carefully. Blood pressure medicines must be taken as prescribed.  Do not skip doses of blood pressure medicine. Doing this puts you at risk for problems and can make the medicine less effective.  Ask your health care provider about side effects or reactions to medicines that you should watch for. Contact a health care provider if you:  Think you are having a reaction to a medicine you are taking.  Have headaches that keep coming back (recurring).  Feel dizzy.  Have swelling in your ankles.  Have trouble with your  vision. Get help right away if you:  Develop a severe headache or confusion.  Have unusual weakness or numbness.  Feel faint.  Have severe pain in your chest or abdomen.  Vomit repeatedly.  Have trouble breathing. Summary  Hypertension is when the force of blood pumping through your arteries is too strong. If this condition is not controlled, it may put you at risk for serious complications.  Your personal target blood pressure may vary depending on your medical conditions, your age, and other factors. For most people, a normal blood pressure is less than 120/80.  Hypertension is treated with lifestyle changes, medicines, or a combination of both. Lifestyle changes include losing weight, eating a healthy, low-sodium diet, exercising more, and limiting alcohol. This information is not intended to replace advice given to you by your health care provider. Make sure you discuss any questions you have with your health care provider. Document Revised: 03/25/2018 Document Reviewed: 03/25/2018 [employer]vier Patient Education  2020 [employer].

## 2020-02-09 NOTE — Progress Notes (Signed)
HEMATOLOGY/ONCOLOGY CONSULTATION NOTE  Date of Service: 02/10/2020  Patient Care Team: [name], MD as PCP - General (Family Medicine) [name], MD as Attending Physician (Physical Medicine and Rehabilitation) [name], MD as Consulting Physician (Orthopedic Surgery) [name], MD as Consulting Physician (Otolaryngology) [name], MD as Consulting Physician (Gastroenterology) [name], MD as Consulting Physician (General Surgery) [name], MD as Consulting Physician (Hematology and Oncology) [name], MD as Consulting Physician (Radiation Oncology) [name], NP as Nurse Practitioner (Hematology and Oncology) [name], [facility] as Pharmacist (Pharmacist)  CHIEF COMPLAINTS/PURPOSE OF CONSULTATION:  Multiple Myeloma not having achieved remission  HISTORY OF PRESENTING ILLNESS:   Tracy [name] is a wonderful 74 y.o. female who has been referred to [place] by Dr [name] for evaluation and management of Multiple Myeloma. The patient's last visit with [place] was on 02/01/20. The pt reports that she is doing well overall.  The pt reports she is good. Pt has been having more fatigue and her right knee keeps giving out. She is also having back and hip pain. Pt has not spoken to PCP about her insulin. Her blood sugar has been good. She is not taking any medication for her diabetes and she is diet controlling her diabetes. Pt did see her dentist and had a root canal yesterday.   Lab results today (02/10/20) of CBC w/diff and CMP is as follows: all values are WNL except for RBC at 2.73, Hemoglobin at 8.4, HCT at 26.6, Glucose at 125, Creatinine at 1.40, Total Protein at 10.9, Albumin at 2.9, AST at 14, Total Bilirubin at 0.2, GFR, Est Non Af Am at 37, GFR, Est AFR Am at 42 02/10/20 of Iron and TIBC is as follows: all values are WNL except for Saturation Ratios at 16 02/10/20 of Ferritin at 13: WNL 02/10/20 of Erythropoietin at 25.8 02/10/20  of Vitamin B12 at 393: WNL  On review of systems, pt reports fatigue, back pain, hip pain, headaches and denies fevers, chills, abdominal pain, irregular bowl movements and any other symptoms.    MEDICAL HISTORY:  Past Medical History:  Diagnosis Date  . Anxiety   . Arthritis   . Breast cancer ([place]) 06/13/15  . Cancer ([place]) 2000   breast cancer  . Chronic bronchitis ([place])   . Chronic bronchitis ([place])   . Hyperlipidemia   . Hypertension   . Myocardial infarction ([place]) 2001  . Personal history of radiation therapy   . Restless leg   . Stroke [facility]) 2004   TIA, no deficits    SURGICAL HISTORY: Past Surgical History:  Procedure Laterality Date  . ABDOMINAL HYSTERECTOMY  1985  . BREAST LUMPECTOMY Left 2000   radiation and chemo  . BREAST LUMPECTOMY Right 2016   radiation  . BREAST SURGERY  2001   lt breast lumpectomy  . RADIOACTIVE SEED GUIDED PARTIAL MASTECTOMY WITH AXILLARY SENTINEL LYMPH NODE BIOPSY Right 07/07/2015   Procedure: RIGHT RADIOACTIVE SEED GUIDED PARTIAL MASTECTOMY WITH AXILLARY SENTINEL LYMPH NODE BIOPSY;  Surgeon: Autumn Messing III, MD;  Location: [place];  Service: General;  Laterality: Right;  . SMALL INTESTINE SURGERY    . TUBAL LIGATION      SOCIAL HISTORY: Social History   Socioeconomic History  . Marital status: Divorced    Spouse name: Not on file  . Number of children: 7  . Years of education: Not on file  . Highest education level: Not on file  Occupational History  .  Occupation: retired  Tobacco Use  . Smoking status: Former Smoker    Packs/day: 1.00    Years: 20.00    Pack years: 20.00    Types: Cigarettes    Quit date: 07/30/2011    Years since quitting: 8.5  . Smokeless tobacco: Never Used  . Tobacco comment: Quit >4 years ago; 1 ppd for about 5/20 years (remaining was less)  Vaping Use  . Vaping Use: Former  Substance and Sexual Activity  . Alcohol use: No    Alcohol/week: 0.0 standard drinks  . Drug use: No  .  Sexual activity: Not on file  Other Topics Concern  . Not on file  Social History Narrative   Lives alone.  Retired.  Education:  11th grade GED.  Children:  7 (one here).    Social Determinants of Health   Financial Resource Strain:   . Difficulty of Paying Living Expenses:   Food Insecurity: No Food Insecurity  . Worried About Charity fundraiser in the Last Year: Never true  . Ran Out of Food in the Last Year: Never true  Transportation Needs: No Transportation Needs  . Lack of Transportation (Medical): No  . Lack of Transportation (Non-Medical): No  Physical Activity:   . Days of Exercise per Week:   . Minutes of Exercise per Session:   Stress:   . Feeling of Stress :   Social Connections:   . Frequency of Communication with Friends and Family:   . Frequency of Social Gatherings with Friends and Family:   . Attends Religious Services:   . Active Member of Clubs or Organizations:   . Attends Archivist Meetings:   Marland Kitchen Marital Status:   Intimate Partner Violence:   . Fear of Current or Ex-Partner:   . Emotionally Abused:   Marland Kitchen Physically Abused:   . Sexually Abused:     FAMILY HISTORY: Family History  Problem Relation Age of Onset  . Diabetes Father   . Lung cancer Sister        dx. <50; former smoker  . Diabetes Brother   . Diabetes Paternal Aunt   . Stroke Maternal Grandmother   . Diabetes Paternal Grandmother   . Emphysema Mother 78       smoker  . Diabetes Brother   . Brain cancer Brother 30       unknown tumor type  . Cancer Daughter 75       neck cancer  . Other Daughter        hysterectomy for unspecified reason  . Breast cancer Cousin   . Cancer Cousin        unspecified type  . Breast cancer Other        triple negative breast cancer in her 51s  . Cancer Daughter   . Colon polyps Neg Hx   . Esophageal cancer Neg Hx   . Gallbladder disease Neg Hx     ALLERGIES:  is allergic to bacitracin-neomycin-polymyxin  [neomycin-bacitracin  zn-polymyx], nsaids, tape, ambien [zolpidem tartrate], amoxicillin, contrast media [iodinated diagnostic agents], latex, and prednisone.  MEDICATIONS:  Current Outpatient Medications  Medication Sig Dispense Refill  . acyclovir (ZOVIRAX) 400 MG tablet Take 1 tablet (400 mg total) by mouth 2 (two) times daily. 60 tablet 11  . aspirin 81 MG tablet Take 81 mg by mouth daily.    Marland Kitchen atorvastatin (LIPITOR) 10 MG tablet TAKE 1 TABLET BY MOUTH  DAILY 90 tablet 3  . dexamethasone (DECADRON) 4 MG tablet  Take 3 tabs with breakfast the day after each treatment. 20 tablet 4  . ferrous sulfate 325 (65 FE) MG tablet Take 1 tablet (325 mg total) by mouth daily with breakfast. 30 tablet 6  . gabapentin (NEURONTIN) 300 MG capsule Take 1 capsule (300 mg total) by mouth 3 (three) times daily. 270 capsule 1  . HYDROcodone-acetaminophen (NORCO) 5-325 MG tablet Take 1 tablet by mouth every 6 (six) hours as needed for moderate pain. 30 tablet 0  . loratadine (CLARITIN) 10 MG tablet Take 10 mg by mouth daily.    Marland Kitchen LORazepam (ATIVAN) 0.5 MG tablet Take 1 tablet (0.5 mg total) by mouth every 6 (six) hours as needed (Nausea or vomiting). 30 tablet 0  . losartan (COZAAR) 100 MG tablet TAKE 1 TABLET BY MOUTH  DAILY 90 tablet 3  . MELATONIN PO Take by mouth. (Patient not taking: Reported on 01/27/2020)    . ondansetron (ZOFRAN) 8 MG tablet Take 1 tablet (8 mg total) by mouth 2 (two) times daily as needed (Nausea or vomiting). 30 tablet 1  . pantoprazole (PROTONIX) 40 MG tablet Take 1 tablet (40 mg total) by mouth 2 (two) times daily. 60 tablet 3  . prochlorperazine (COMPAZINE) 10 MG tablet Take 1 tablet (10 mg total) by mouth every 6 (six) hours as needed (Nausea or vomiting). 30 tablet 1  . sertraline (ZOLOFT) 50 MG tablet TAKE 1 TABLET(50 MG) BY MOUTH DAILY 90 tablet 0  . tiZANidine (ZANAFLEX) 4 MG tablet Take 1 tablet (4 mg total) by mouth at bedtime. 30 tablet 3  . traZODone (DESYREL) 100 MG tablet TAKE 1 TABLET BY MOUTH AT   BEDTIME 90 tablet 3   No current facility-administered medications for this visit.    REVIEW OF SYSTEMS:   A 10+ POINT REVIEW OF SYSTEMS WAS OBTAINED including neurology, dermatology, psychiatry, cardiac, respiratory, lymph, extremities, GI, GU, Musculoskeletal, constitutional, breasts, reproductive, HEENT.  All pertinent positives are noted in the HPI.  All others are negative.   PHYSICAL EXAMINATION: ECOG PERFORMANCE STATUS: 2 - Symptomatic, <50% confined to bed  .There were no vitals filed for this visit. There were no vitals filed for this visit. .There is no height or weight on file to calculate BMI.  Exam was given in a wheel chair   GENERAL:alert, in no acute distress and comfortable SKIN: no acute rashes, no significant lesions EYES: conjunctiva are pink and non-injected, sclera anicteric OROPHARYNX: MMM, no exudates, no oropharyngeal erythema or ulceration NECK: supple, no JVD LYMPH:  no palpable lymphadenopathy in the cervical, axillary or inguinal regions LUNGS: clear to auscultation b/l with normal respiratory effort HEART: regular rate & rhythm ABDOMEN:  normoactive bowel sounds , non tender, not distended. Extremity: no pedal edema PSYCH: alert & oriented x 3 with fluent speech NEURO: no focal motor/sensory deficits  LABORATORY DATA:  I have reviewed the data as listed  . CBC Latest Ref Rng & Units 01/25/2020 01/17/2020 01/13/2020  WBC 4.0 - 10.5 K/uL 6.3 7.6 6.4  Hemoglobin 12.0 - 15.0 g/dL 7.5(L) 8.3(L) 7.9 Repeated and verified X2.(LL)  Hematocrit 36 - 46 % 24.0(L) 26.4(L) 23.2 Repeated and verified X2.(LL)  Platelets 150 - 400 K/uL 284 306 272.0    . CMP Latest Ref Rng & Units 01/25/2020 01/19/2020 01/13/2020  Glucose 70 - 99 mg/dL 129(H) 146(H) 100(H)  BUN 8 - 23 mg/dL '12 16 17  ' Creatinine 0.44 - 1.00 mg/dL 1.38(H) 1.19 1.31(H)  Sodium 135 - 145 mmol/L 137 131(L) 132(L)  Potassium  3.5 - 5.1 mmol/L 4.6 4.0 4.4  Chloride 98 - 111 mmol/L 109 101 104  CO2 22  - 32 mmol/L 21(L) 25 24  Calcium 8.9 - 10.3 mg/dL 8.9 8.7 8.6  Total Protein 6.5 - 8.1 g/dL 10.1(H) - 10.2(H)  Total Bilirubin 0.3 - 1.2 mg/dL <0.2(L) - 0.2  Alkaline Phos 38 - 126 U/L 81 - 81  AST 15 - 41 U/L 15 - 12  ALT 0 - 44 U/L 16 - 11   01/25/2020 K/L light chains:    01/25/2020 MMP:            RADIOGRAPHIC STUDIES: I have personally reviewed the radiological images as listed and agreed with the findings in the report. NM PET Image Initial (PI) Skull Base To Thigh  Result Date: 01/20/2020 CLINICAL DATA:  Initial treatment strategy for lytic bone lesions. Cancer of unknown primary. COVID vaccine in right arm in May. EXAM: NUCLEAR MEDICINE PET SKULL BASE TO THIGH TECHNIQUE: 9.6 mCi F-18 FDG was injected intravenously. Full-ring PET imaging was performed from the skull base to thigh after the radiotracer. CT data was obtained and used for attenuation correction and anatomic localization. Fasting blood glucose: 121 mg/dl COMPARISON:  Abdominopelvic CT of 01/07/2020. FINDINGS: Mediastinal blood pool activity: SUV max 3.3 Liver activity: SUV max NA NECK: No areas of abnormal hypermetabolism. Incidental CT findings: No cervical adenopathy. CHEST: No pulmonary parenchymal or thoracic nodal hypermetabolism. Incidental CT findings: Aortic atherosclerosis. Mild cardiomegaly. Right axillary node dissection. Moderate centrilobular emphysema. ABDOMEN/PELVIS: No abdominopelvic parenchymal or nodal hypermetabolism. Incidental CT findings: Deferred to abdominopelvic CT. Hepatic steatosis. Normal adrenal glands. Abdominal aortic atherosclerosis. Contrast within the colon related to prior esophagram. SKELETON: Hypermetabolism corresponding to the posterior left acetabular lesion. 3.8 cm and a S.U.V. max of 5.5 on 169/4. Eccentric right L5 lesion measures 2.9 cm and a S.U.V. max of 4.6 on 137/4. Right humeral head hypermetabolism without well-defined CT correlate. On the order of a S.U.V. max of 4.1.  Incidental CT findings: Right-sided surgical changes of lumpectomy with skin thickening, likely radiation induced. IMPRESSION: 1. Hypermetabolism corresponding to left acetabular and L5 lytic lesions, with differential considerations of metastatic disease or myeloma. 2. No evidence of hypermetabolic soft tissue primary, metastatic disease, or soft tissue myeloma. 3. Incidental findings, including: Aortic atherosclerosis (ICD10-I70.0) and emphysema (ICD10-J43.9). Hepatic steatosis. Electronically Signed   By: Abigail Miyamoto M.D.   On: 01/20/2020 12:48   CT BIOPSY  Result Date: 01/17/2020 INDICATION: 74 year old with lytic bone lesions and with history of breast cancer. Evaluate for metastatic disease versus myeloma. EXAM: CT GUIDED BONE MARROW ASPIRATES AND BIOPSY CT-GUIDED LEFT PELVIC BONE LESION BIOPSY Physician: Stephan Minister. Anselm Pancoast, MD MEDICATIONS: None. ANESTHESIA/SEDATION: Fentanyl 4.0 mcg IV; Versed 100 mg IV Moderate Sedation Time:  40 minutes The patient was continuously monitored during the procedure by the interventional radiology nurse under my direct supervision. COMPLICATIONS: None immediate. PROCEDURE: The procedure was explained to the patient. The risks and benefits of the procedure were discussed and the patient's questions were addressed. Informed consent was obtained from the patient. The patient was placed prone on CT table. Images of the pelvis were obtained. The right side of back was prepped and draped in sterile fashion. The skin and right posterior ilium were anesthetized with 1% lidocaine. 11 gauge bone needle was directed into the right ilium with CT guidance. Two aspirates and two core biopsies were obtained. Bandage placed over the puncture site. Attention was directed to the left pelvic lytic bone lesion. Additional  CT images were obtained. Left side of the pelvis was prepped with chlorhexidine and sterile field was created. Skin was anesthetized with 1% lidocaine. Using CT guidance, a 17  gauge coaxial needle was directed through the posterior cortex of the left ischium into the lytic lesion. Total of 3 core biopsies were obtained with an 18 gauge BioPince core device. Specimens placed in formalin. 17 gauge needle was removed without complication. Bandage placed over the puncture site. FINDINGS: Bone marrow biopsy needle directed into the posterior right ilium. Lucent lesion involving the left ischium with cortical destruction along the medial aspect. Biopsy needle was directed into the lucent lesion. Soft tissue core biopsies placed in formalin. Residual oral contrast within the rectosigmoid region from the diagnostic CT on 01/07/2020. IMPRESSION: CT guided bone marrow aspiration and core biopsy. CT-guided core biopsy of left pelvic lytic lesion. Electronically Signed   By: Markus Daft M.D.   On: 01/17/2020 11:47   CT Biopsy  Result Date: 01/17/2020 INDICATION: 74 year old with lytic bone lesions and with history of breast cancer. Evaluate for metastatic disease versus myeloma. EXAM: CT GUIDED BONE MARROW ASPIRATES AND BIOPSY CT-GUIDED LEFT PELVIC BONE LESION BIOPSY Physician: Stephan Minister. Anselm Pancoast, MD MEDICATIONS: None. ANESTHESIA/SEDATION: Fentanyl 4.0 mcg IV; Versed 100 mg IV Moderate Sedation Time:  40 minutes The patient was continuously monitored during the procedure by the interventional radiology nurse under my direct supervision. COMPLICATIONS: None immediate. PROCEDURE: The procedure was explained to the patient. The risks and benefits of the procedure were discussed and the patient's questions were addressed. Informed consent was obtained from the patient. The patient was placed prone on CT table. Images of the pelvis were obtained. The right side of back was prepped and draped in sterile fashion. The skin and right posterior ilium were anesthetized with 1% lidocaine. 11 gauge bone needle was directed into the right ilium with CT guidance. Two aspirates and two core biopsies were obtained.  Bandage placed over the puncture site. Attention was directed to the left pelvic lytic bone lesion. Additional CT images were obtained. Left side of the pelvis was prepped with chlorhexidine and sterile field was created. Skin was anesthetized with 1% lidocaine. Using CT guidance, a 17 gauge coaxial needle was directed through the posterior cortex of the left ischium into the lytic lesion. Total of 3 core biopsies were obtained with an 18 gauge BioPince core device. Specimens placed in formalin. 17 gauge needle was removed without complication. Bandage placed over the puncture site. FINDINGS: Bone marrow biopsy needle directed into the posterior right ilium. Lucent lesion involving the left ischium with cortical destruction along the medial aspect. Biopsy needle was directed into the lucent lesion. Soft tissue core biopsies placed in formalin. Residual oral contrast within the rectosigmoid region from the diagnostic CT on 01/07/2020. IMPRESSION: CT guided bone marrow aspiration and core biopsy. CT-guided core biopsy of left pelvic lytic lesion. Electronically Signed   By: Markus Daft M.D.   On: 01/17/2020 11:47   CT BONE MARROW BIOPSY & ASPIRATION  Result Date: 01/17/2020 INDICATION: 74 year old with lytic bone lesions and with history of breast cancer. Evaluate for metastatic disease versus myeloma. EXAM: CT GUIDED BONE MARROW ASPIRATES AND BIOPSY CT-GUIDED LEFT PELVIC BONE LESION BIOPSY Physician: Stephan Minister. Anselm Pancoast, MD MEDICATIONS: None. ANESTHESIA/SEDATION: Fentanyl 4.0 mcg IV; Versed 100 mg IV Moderate Sedation Time:  40 minutes The patient was continuously monitored during the procedure by the interventional radiology nurse under my direct supervision. COMPLICATIONS: None immediate. PROCEDURE: The procedure was explained  to the patient. The risks and benefits of the procedure were discussed and the patient's questions were addressed. Informed consent was obtained from the patient. The patient was placed prone on  CT table. Images of the pelvis were obtained. The right side of back was prepped and draped in sterile fashion. The skin and right posterior ilium were anesthetized with 1% lidocaine. 11 gauge bone needle was directed into the right ilium with CT guidance. Two aspirates and two core biopsies were obtained. Bandage placed over the puncture site. Attention was directed to the left pelvic lytic bone lesion. Additional CT images were obtained. Left side of the pelvis was prepped with chlorhexidine and sterile field was created. Skin was anesthetized with 1% lidocaine. Using CT guidance, a 17 gauge coaxial needle was directed through the posterior cortex of the left ischium into the lytic lesion. Total of 3 core biopsies were obtained with an 18 gauge BioPince core device. Specimens placed in formalin. 17 gauge needle was removed without complication. Bandage placed over the puncture site. FINDINGS: Bone marrow biopsy needle directed into the posterior right ilium. Lucent lesion involving the left ischium with cortical destruction along the medial aspect. Biopsy needle was directed into the lucent lesion. Soft tissue core biopsies placed in formalin. Residual oral contrast within the rectosigmoid region from the diagnostic CT on 01/07/2020. IMPRESSION: CT guided bone marrow aspiration and core biopsy. CT-guided core biopsy of left pelvic lytic lesion. Electronically Signed   By: Markus Daft M.D.   On: 01/17/2020 11:47    ASSESSMENT & PLAN:   74 yo with   1) Newly diagnosed IgG Kappa Multiple myeloma with bone lesions, anemia, renal insuff. M spike @ 3.7g/dl 2) h/o Dm2 3) Diabetic Neuropathy 4) CKD - likely from DM2, but could have an element of myeloma kidney. 5) h/o TIA and AMI 6) Iron deficiency PLAN: -Discussed pt labwork today, 02/10/20; of CBC w/diff and CMP is as follows: all values are WNL except for RBC at 2.73, Hemoglobin at 8.4, HCT at 26.6, Glucose at 125, Creatinine at 1.40, Total Protein at  10.9, Albumin at 2.9, AST at 14, Total Bilirubin at 0.2, GFR, Est Non Af Am at 37, GFR, Est AFR Am at 42 -Discussed 02/10/20 of Iron and TIBC is as follows: all values are WNL except for Saturation Ratios at 16 -Discussed 02/10/20 of Ferritin at 13: WNL -Discussed 02/10/20 of Erythropoietin at 25.8 -Discussed 02/10/20 of Vitamin B12 at 393: WNL -Advised pt is anemic due to myeloma- possibility of blood transfusion as needed -Goal hemoglobin at >8 -Discussed CRAB criteria: no hypercalcemia, minor renal dysfunction that can be otherwise explained, significant anemia, bone lesions identified on scan  -Advised on headaches- anemia, stress  -Advised pt that she has 1p deletion and a polymorphic variant which suggest a more aggressive disease and 13q deletion which suggest a less agressive form of disease.  -Advised pt that steroids used with treatment will increase her blood glucose levels. She must work with her PCP or Endocrinologist to tightly control her blood glucose levels.  -Advised pt that Immunomodulators increase the risk of blood clots, heart attacks, and strokes; proteasome inhibitors can worsen neuropathy. These will need to be factored in during treatment considerations.  -Advised pt that we will also have to begin bisphosphonates to strengthen bones. Pt must be cleared by a dentist prior to starting this medication -Advised pt okay to use Tylenol prn for pain control  -Advised cannot do bone strengthening medication for 2 months  -  Recommend pt avoid bending, twisting, lifting, pushing, or pulling anything heavy  -Will refer pt to Radiation Oncology for RT of symptomatic lesions -Recommend pt f/u with PCP for coordination of care for diabetes control  -Recommend pt f/u with Dentist for dental clearance for bisphosphonates therapy  -Will begin pt on Daratumumab + Dexamethasone tomorrow  -Will see back in 1 week with C1D8 -will need to consider IV Iron replacement -- will discuss with  patient  FOLLOW UP: -Additional labs today -F/u for C1D1 of Daratumumab tomorrow as scheduled  -Referral to radiation oncology for consideration of palliative RT to symptomatic bone lesions -Please schedule remaining 3 weekly doses of C1 of Daratumumab with labs -MD visit with C1D8 of treatment in 1 week   The total time spent in the appt was 40 minutes and more than 50% was on counseling and direct patient cares.  All of the patient's questions were answered with apparent satisfaction. The patient knows to call the clinic with any problems, questions or concerns.  [name] MD [place] AAHIVMS [facility] Hematology/Oncology Physician [facility]  (Office):       [phone] (Work cell):  [phone] (Fax):           [phone]  02/10/2020 8:23 AM  I, Dawayne Cirri am acting as a Education administrator for Dr. [name].   .I have reviewed the above documentation for accuracy and completeness, and I agree with the above. [name] MD

## 2020-02-10 ENCOUNTER — Other Ambulatory Visit: Payer: Self-pay | Admitting: *Deleted

## 2020-02-10 ENCOUNTER — Telehealth: Payer: Self-pay | Admitting: Family Medicine

## 2020-02-10 ENCOUNTER — Inpatient Hospital Stay: Payer: Medicare Other | Admitting: Hematology

## 2020-02-10 ENCOUNTER — Other Ambulatory Visit: Payer: Self-pay

## 2020-02-10 ENCOUNTER — Inpatient Hospital Stay: Payer: Medicare Other

## 2020-02-10 VITALS — BP 148/58 | HR 67 | Temp 97.5°F | Resp 18 | Ht 60.43 in | Wt 194.3 lb

## 2020-02-10 DIAGNOSIS — D649 Anemia, unspecified: Secondary | ICD-10-CM

## 2020-02-10 DIAGNOSIS — C9000 Multiple myeloma not having achieved remission: Secondary | ICD-10-CM

## 2020-02-10 DIAGNOSIS — E1140 Type 2 diabetes mellitus with diabetic neuropathy, unspecified: Secondary | ICD-10-CM

## 2020-02-10 DIAGNOSIS — Z853 Personal history of malignant neoplasm of breast: Secondary | ICD-10-CM | POA: Diagnosis not present

## 2020-02-10 DIAGNOSIS — Z5112 Encounter for antineoplastic immunotherapy: Secondary | ICD-10-CM | POA: Diagnosis not present

## 2020-02-10 DIAGNOSIS — Z923 Personal history of irradiation: Secondary | ICD-10-CM | POA: Diagnosis not present

## 2020-02-10 DIAGNOSIS — Z7189 Other specified counseling: Secondary | ICD-10-CM

## 2020-02-10 DIAGNOSIS — Z9221 Personal history of antineoplastic chemotherapy: Secondary | ICD-10-CM | POA: Diagnosis not present

## 2020-02-10 LAB — CBC WITH DIFFERENTIAL/PLATELET
Abs Immature Granulocytes: 0.03 10*3/uL (ref 0.00–0.07)
Basophils Absolute: 0 10*3/uL (ref 0.0–0.1)
Basophils Relative: 1 %
Eosinophils Absolute: 0 10*3/uL (ref 0.0–0.5)
Eosinophils Relative: 1 %
HCT: 26.6 % — ABNORMAL LOW (ref 36.0–46.0)
Hemoglobin: 8.4 g/dL — ABNORMAL LOW (ref 12.0–15.0)
Immature Granulocytes: 1 %
Lymphocytes Relative: 44 %
Lymphs Abs: 2.8 10*3/uL (ref 0.7–4.0)
MCH: 30.8 pg (ref 26.0–34.0)
MCHC: 31.6 g/dL (ref 30.0–36.0)
MCV: 97.4 fL (ref 80.0–100.0)
Monocytes Absolute: 0.4 10*3/uL (ref 0.1–1.0)
Monocytes Relative: 6 %
Neutro Abs: 3.2 10*3/uL (ref 1.7–7.7)
Neutrophils Relative %: 47 %
Platelets: 277 10*3/uL (ref 150–400)
RBC: 2.73 MIL/uL — ABNORMAL LOW (ref 3.87–5.11)
RDW: 14.3 % (ref 11.5–15.5)
WBC: 6.5 10*3/uL (ref 4.0–10.5)
nRBC: 0 % (ref 0.0–0.2)

## 2020-02-10 LAB — CMP (CANCER CENTER ONLY)
ALT: 11 U/L (ref 0–44)
AST: 14 U/L — ABNORMAL LOW (ref 15–41)
Albumin: 2.9 g/dL — ABNORMAL LOW (ref 3.5–5.0)
Alkaline Phosphatase: 88 U/L (ref 38–126)
Anion gap: 7 (ref 5–15)
BUN: 14 mg/dL (ref 8–23)
CO2: 23 mmol/L (ref 22–32)
Calcium: 8.9 mg/dL (ref 8.9–10.3)
Chloride: 107 mmol/L (ref 98–111)
Creatinine: 1.4 mg/dL — ABNORMAL HIGH (ref 0.44–1.00)
GFR, Est AFR Am: 42 mL/min — ABNORMAL LOW (ref >60)
GFR, Estimated: 37 mL/min — ABNORMAL LOW (ref >60)
Glucose, Bld: 125 mg/dL — ABNORMAL HIGH (ref 70–99)
Potassium: 4.2 mmol/L (ref 3.5–5.1)
Sodium: 137 mmol/L (ref 135–145)
Total Bilirubin: 0.2 mg/dL — ABNORMAL LOW (ref 0.3–1.2)
Total Protein: 10.9 g/dL — ABNORMAL HIGH (ref 6.5–8.1)

## 2020-02-10 LAB — ABO/RH: ABO/RH(D): A POS

## 2020-02-10 LAB — IRON AND TIBC
Iron: 46 ug/dL (ref 41–142)
Saturation Ratios: 16 % — ABNORMAL LOW (ref 21–57)
TIBC: 283 ug/dL (ref 236–444)
UIBC: 237 ug/dL (ref 120–384)

## 2020-02-10 LAB — FERRITIN: Ferritin: 13 ng/mL (ref 11–307)

## 2020-02-10 LAB — VITAMIN B12: Vitamin B-12: 393 pg/mL (ref 180–914)

## 2020-02-10 MED ORDER — ACCU-CHEK SOFTCLIX LANCETS MISC: 12 refills | Status: DC

## 2020-02-10 MED ORDER — ACCU-CHEK GUIDE VI STRP: ORAL_STRIP | 12 refills | Status: AC

## 2020-02-10 NOTE — Telephone Encounter (Signed)
She may need something for her sugars but I would like her to start treatment first so if she develops side effects, we know what she is reacting to and can treat appropriately.  She needs to watch her sugar and carb intake closely when on the steroids.  We can provider her with a meter, lancets, and strips if she is interested in checking her sugars.

## 2020-02-10 NOTE — Patient Instructions (Addendum)
-  Will refer pt to Radiation Oncology for RT of symptomatic lesions -Recommend pt follow up with PCP for coordination of care for diabetes control  -Recommend pt follow up with Dentist for dental clearance for bisphosphonates therapy  -Recommends blood transfusion today, tomorrow (07/16) or Saturday (07/17)

## 2020-02-10 NOTE — Telephone Encounter (Signed)
Called and advised pt. She stated an understanding. Glucometer placed at front desk for pt and lancets and strips sent to pharmacy.

## 2020-02-10 NOTE — Telephone Encounter (Signed)
Tracy [name] starts chemo tomorrow and her oncologist wants to know if she should be put on something for diabetes.  Steroid that she will be taking increases her sugar.  Please advise

## 2020-02-10 NOTE — Telephone Encounter (Signed)
Please advise 

## 2020-02-10 NOTE — Progress Notes (Signed)
Pharmacist Chemotherapy Monitoring - Initial Assessment    Anticipated start date: 02/11/20   Regimen:  . Are orders appropriate based on the patient's diagnosis, regimen, and cycle? Yes . Does the plan date match the patient's scheduled date? Yes . Is the sequencing of drugs appropriate? Yes . Are the premedications appropriate for the patient's regimen? Yes . Prior Authorization for treatment is: Approved o If applicable, is the correct biosimilar selected based on the patient's insurance? not applicable  Organ Function and Labs: Marland Kitchen Are dose adjustments needed based on the patient's renal function, hepatic function, or hematologic function? Yes . Are appropriate labs ordered prior to the start of patient's treatment? No . Other organ system assessment, if indicated: N/A . The following baseline labs, if indicated, have been ordered: daratumumab: blood type and screen  Dose Assessment: . Are the drug doses appropriate? Yes . Are the following correct: o Drug concentrations Yes o IV fluid compatible with drug Yes o Administration routes Yes o Timing of therapy Yes . If applicable, does the patient have documented access for treatment and/or plans for port-a-cath placement? no . If applicable, have lifetime cumulative doses been properly documented and assessed? not applicable Lifetime Dose Tracking  No doses have been documented on this patient for the following tracked chemicals: Doxorubicin, Epirubicin, Idarubicin, Daunorubicin, Mitoxantrone, Bleomycin, Oxaliplatin, Carboplatin, Liposomal Doxorubicin  o   Toxicity Monitoring/Prevention: . The patient has the following take home antiemetics prescribed: Ondansetron, Prochlorperazine and Dexamethasone . The patient has the following take home medications prescribed: N/A . Medication allergies and previous infusion related reactions, if applicable, have been reviewed and addressed. Yes . The patient's current medication list has been  assessed for drug-drug interactions with their chemotherapy regimen. no significant drug-drug interactions were identified on review.  Order Review: . Are the treatment plan orders signed? No . Is the patient scheduled to see a provider prior to their treatment? Yes  I verify that I have reviewed each item in the above checklist and answered each question accordingly.  [facility] 02/10/2020 8:33 AM

## 2020-02-11 ENCOUNTER — Telehealth: Payer: Self-pay | Admitting: Hematology

## 2020-02-11 ENCOUNTER — Other Ambulatory Visit: Payer: Self-pay | Admitting: Hematology

## 2020-02-11 ENCOUNTER — Other Ambulatory Visit: Payer: Self-pay

## 2020-02-11 ENCOUNTER — Inpatient Hospital Stay: Payer: Medicare Other

## 2020-02-11 VITALS — BP 175/82 | HR 84 | Temp 99.3°F | Resp 16

## 2020-02-11 DIAGNOSIS — Z923 Personal history of irradiation: Secondary | ICD-10-CM | POA: Diagnosis not present

## 2020-02-11 DIAGNOSIS — Z5112 Encounter for antineoplastic immunotherapy: Secondary | ICD-10-CM | POA: Diagnosis not present

## 2020-02-11 DIAGNOSIS — C9000 Multiple myeloma not having achieved remission: Secondary | ICD-10-CM | POA: Diagnosis not present

## 2020-02-11 DIAGNOSIS — Z9221 Personal history of antineoplastic chemotherapy: Secondary | ICD-10-CM | POA: Diagnosis not present

## 2020-02-11 DIAGNOSIS — Z7189 Other specified counseling: Secondary | ICD-10-CM

## 2020-02-11 DIAGNOSIS — Z853 Personal history of malignant neoplasm of breast: Secondary | ICD-10-CM | POA: Diagnosis not present

## 2020-02-11 LAB — PRETREATMENT RBC PHENOTYPE

## 2020-02-11 LAB — ERYTHROPOIETIN: Erythropoietin: 25.8 m[IU]/mL — ABNORMAL HIGH (ref 2.6–18.5)

## 2020-02-11 MED ORDER — ONDANSETRON HCL 4 MG/2ML IJ SOLN
8.0000 mg | Freq: Once | INTRAMUSCULAR | Status: AC
  Administered 2020-02-11: 8 mg via INTRAVENOUS

## 2020-02-11 MED ORDER — ACETAMINOPHEN 325 MG PO TABS
650.0000 mg | ORAL_TABLET | Freq: Once | ORAL | Status: AC
  Administered 2020-02-11: 650 mg via ORAL

## 2020-02-11 MED ORDER — SODIUM CHLORIDE 0.9 % IV SOLN
Freq: Once | INTRAVENOUS | Status: AC
  Filled 2020-02-11: qty 250

## 2020-02-11 MED ORDER — MONTELUKAST SODIUM 10 MG PO TABS
ORAL_TABLET | ORAL | Status: AC
  Filled 2020-02-11: qty 1

## 2020-02-11 MED ORDER — FAMOTIDINE IN NACL 20-0.9 MG/50ML-% IV SOLN
20.0000 mg | Freq: Once | INTRAVENOUS | Status: AC
  Administered 2020-02-11: 20 mg via INTRAVENOUS

## 2020-02-11 MED ORDER — ACETAMINOPHEN 325 MG PO TABS
ORAL_TABLET | ORAL | Status: AC
  Filled 2020-02-11: qty 2

## 2020-02-11 MED ORDER — FAMOTIDINE IN NACL 20-0.9 MG/50ML-% IV SOLN
INTRAVENOUS | Status: AC
  Filled 2020-02-11: qty 50

## 2020-02-11 MED ORDER — MONTELUKAST SODIUM 10 MG PO TABS
10.0000 mg | ORAL_TABLET | Freq: Once | ORAL | Status: AC
  Administered 2020-02-11: 10 mg via ORAL

## 2020-02-11 MED ORDER — ONDANSETRON HCL 4 MG/2ML IJ SOLN
INTRAMUSCULAR | Status: AC
  Filled 2020-02-11: qty 4

## 2020-02-11 MED ORDER — SODIUM CHLORIDE 0.9 % IV SOLN
20.0000 mg | Freq: Once | INTRAVENOUS | Status: AC
  Administered 2020-02-11: 20 mg via INTRAVENOUS
  Filled 2020-02-11: qty 20

## 2020-02-11 MED ORDER — DIPHENHYDRAMINE HCL 25 MG PO CAPS
ORAL_CAPSULE | ORAL | Status: AC
  Filled 2020-02-11: qty 2

## 2020-02-11 MED ORDER — SODIUM CHLORIDE 0.9 % IV SOLN
16.0000 mg/kg | Freq: Once | INTRAVENOUS | Status: AC
  Administered 2020-02-11: 1400 mg via INTRAVENOUS
  Filled 2020-02-11: qty 60

## 2020-02-11 MED ORDER — DIPHENHYDRAMINE HCL 25 MG PO CAPS
50.0000 mg | ORAL_CAPSULE | Freq: Once | ORAL | Status: AC
  Administered 2020-02-11: 50 mg via ORAL

## 2020-02-11 NOTE — Progress Notes (Signed)
Pt. complained of nausea, no emesis noted. Dr. Irene Limbo notified and Zofran 8mg  po given. Pt. states she received relief from anti-nausea medication.

## 2020-02-11 NOTE — Telephone Encounter (Signed)
Scheduled per 07/15 los, called and spoke with patient's grand daughter. Patient will be notified of upcoming appointments.

## 2020-02-11 NOTE — Patient Instructions (Signed)
[place] [facility] Discharge Instructions for Patients Receiving Chemotherapy  Today you received the following immunotherapy agent: Daratumumab  To help prevent nausea and vomiting after your treatment, we encourage you to take your nausea medication as directed by your MD.   If you develop nausea and vomiting that is not controlled by your nausea medication, call the clinic.   BELOW ARE SYMPTOMS THAT SHOULD BE REPORTED IMMEDIATELY:  *FEVER GREATER THAN 100.5 F  *CHILLS WITH OR WITHOUT FEVER  NAUSEA AND VOMITING THAT IS NOT CONTROLLED WITH YOUR NAUSEA MEDICATION  *UNUSUAL SHORTNESS OF BREATH  *UNUSUAL BRUISING OR BLEEDING  TENDERNESS IN MOUTH AND THROAT WITH OR WITHOUT PRESENCE OF ULCERS  *URINARY PROBLEMS  *BOWEL PROBLEMS  UNUSUAL RASH Items with * indicate a potential emergency and should be followed up as soon as possible.  Feel free to call the clinic should you have any questions or concerns. The clinic phone number is [phone].  Please show the [place] at check-in to the Emergency Department and triage nurse.  Daratumumab injection What is this medicine? DARATUMUMAB (dar a toom ue mab) is a monoclonal antibody. It is used to treat multiple myeloma. This medicine may be used for other purposes; ask your health care provider or pharmacist if you have questions. COMMON BRAND NAME(S): DARZALEX What should I tell my health care provider before I take this medicine? They need to know if you have any of these conditions:  infection (especially a virus infection such as chickenpox, herpes, or hepatitis B virus)  lung or breathing disease  an unusual or allergic reaction to daratumumab, other medicines, foods, dyes, or preservatives  pregnant or trying to get pregnant  breast-feeding How should I use this medicine? This medicine is for infusion into a vein. It is given by a health care professional in a hospital or clinic setting. Talk to your  pediatrician regarding the use of this medicine in children. Special care may be needed. Overdosage: If you think you have taken too much of this medicine contact a poison control center or emergency room at once. NOTE: This medicine is only for you. Do not share this medicine with others. What if I miss a dose? Keep appointments for follow-up doses as directed. It is important not to miss your dose. Call your doctor or health care professional if you are unable to keep an appointment. What may interact with this medicine? Interactions have not been studied. This list may not describe all possible interactions. Give your health care provider a list of all the medicines, herbs, non-prescription drugs, or dietary supplements you use. Also tell them if you smoke, drink alcohol, or use illegal drugs. Some items may interact with your medicine. What should I watch for while using this medicine? This drug may make you feel generally unwell. Report any side effects. Continue your course of treatment even though you feel ill unless your doctor tells you to stop. This medicine can cause serious allergic reactions. To reduce your risk you may need to take medicine before treatment with this medicine. Take your medicine as directed. This medicine can affect the results of blood tests to match your blood type. These changes can last for up to 6 months after the final dose. Your healthcare provider will do blood tests to match your blood type before you start treatment. Tell all of your healthcare providers that you are being treated with this medicine before receiving a blood transfusion. This medicine can affect the results  of some tests used to determine treatment response; extra tests may be needed to evaluate response. Do not become pregnant while taking this medicine or for 3 months after stopping it. Women should inform their doctor if they wish to become pregnant or think they might be pregnant. There is a  potential for serious side effects to an unborn child. Talk to your health care professional or pharmacist for more information. What side effects may I notice from receiving this medicine? Side effects that you should report to your doctor or health care professional as soon as possible:  allergic reactions like skin rash, itching or hives, swelling of the face, lips, or tongue  breathing problems  chills  cough  dizziness  feeling faint or lightheaded  headache  low blood counts - this medicine may decrease the number of white blood cells, red blood cells and platelets. You may be at increased risk for infections and bleeding.  nausea, vomiting  shortness of breath  signs of decreased platelets or bleeding - bruising, pinpoint red spots on the skin, black, tarry stools, blood in the urine  signs of decreased red blood cells - unusually weak or tired, feeling faint or lightheaded, falls  signs of infection - fever or chills, cough, sore throat, pain or difficulty passing urine  signs and symptoms of liver injury like dark yellow or brown urine; general ill feeling or flu-like symptoms; light-colored stools; loss of appetite; right upper belly pain; unusually weak or tired; yellowing of the eyes or skin Side effects that usually do not require medical attention (report to your doctor or health care professional if they continue or are bothersome):  back pain  constipation  diarrhea  joint pain  muscle cramps  pain, tingling, numbness in the hands or feet  swelling of the ankles, feet, hands  tiredness  trouble sleeping This list may not describe all possible side effects. Call your doctor for medical advice about side effects. You may report side effects to FDA at 1-800-FDA-1088. Where should I keep my medicine? This drug is given in a hospital or clinic and will not be stored at home. NOTE: This sheet is a summary. It may not cover all possible information. If  you have questions about this medicine, talk to your doctor, pharmacist, or health care provider.  2020 Elsevier/Gold Standard (2019-03-23 18:10:54)

## 2020-02-13 ENCOUNTER — Other Ambulatory Visit: Payer: Self-pay

## 2020-02-13 ENCOUNTER — Emergency Department (HOSPITAL_COMMUNITY): Payer: Medicare Other

## 2020-02-13 ENCOUNTER — Emergency Department (HOSPITAL_COMMUNITY)
Admission: EM | Admit: 2020-02-13 | Discharge: 2020-02-13 | Disposition: A | Discharge status: Home / Self Care | Payer: Medicare Other | Attending: Emergency Medicine | Admitting: Emergency Medicine

## 2020-02-13 ENCOUNTER — Encounter (HOSPITAL_COMMUNITY): Payer: Self-pay

## 2020-02-13 ENCOUNTER — Other Ambulatory Visit: Payer: Self-pay | Admitting: Family Medicine

## 2020-02-13 DIAGNOSIS — R52 Pain, unspecified: Secondary | ICD-10-CM

## 2020-02-13 DIAGNOSIS — I10 Essential (primary) hypertension: Secondary | ICD-10-CM | POA: Insufficient documentation

## 2020-02-13 DIAGNOSIS — I2510 Atherosclerotic heart disease of native coronary artery without angina pectoris: Secondary | ICD-10-CM | POA: Diagnosis not present

## 2020-02-13 DIAGNOSIS — Z87891 Personal history of nicotine dependence: Secondary | ICD-10-CM | POA: Insufficient documentation

## 2020-02-13 DIAGNOSIS — Z743 Need for continuous supervision: Secondary | ICD-10-CM | POA: Diagnosis not present

## 2020-02-13 DIAGNOSIS — Z79899 Other long term (current) drug therapy: Secondary | ICD-10-CM | POA: Diagnosis not present

## 2020-02-13 DIAGNOSIS — Z20822 Contact with and (suspected) exposure to covid-19: Secondary | ICD-10-CM | POA: Diagnosis not present

## 2020-02-13 DIAGNOSIS — J45909 Unspecified asthma, uncomplicated: Secondary | ICD-10-CM | POA: Diagnosis not present

## 2020-02-13 DIAGNOSIS — I639 Cerebral infarction, unspecified: Secondary | ICD-10-CM | POA: Diagnosis not present

## 2020-02-13 DIAGNOSIS — Z91040 Latex allergy status: Secondary | ICD-10-CM | POA: Insufficient documentation

## 2020-02-13 DIAGNOSIS — M791 Myalgia, unspecified site: Secondary | ICD-10-CM | POA: Insufficient documentation

## 2020-02-13 DIAGNOSIS — Z7984 Long term (current) use of oral hypoglycemic drugs: Secondary | ICD-10-CM | POA: Insufficient documentation

## 2020-02-13 DIAGNOSIS — Z7982 Long term (current) use of aspirin: Secondary | ICD-10-CM | POA: Diagnosis not present

## 2020-02-13 DIAGNOSIS — R0602 Shortness of breath: Secondary | ICD-10-CM | POA: Diagnosis not present

## 2020-02-13 DIAGNOSIS — E119 Type 2 diabetes mellitus without complications: Secondary | ICD-10-CM | POA: Diagnosis not present

## 2020-02-13 DIAGNOSIS — R05 Cough: Secondary | ICD-10-CM | POA: Diagnosis not present

## 2020-02-13 DIAGNOSIS — R5381 Other malaise: Secondary | ICD-10-CM | POA: Diagnosis not present

## 2020-02-13 DIAGNOSIS — R404 Transient alteration of awareness: Secondary | ICD-10-CM | POA: Diagnosis not present

## 2020-02-13 DIAGNOSIS — M545 Low back pain: Secondary | ICD-10-CM | POA: Diagnosis not present

## 2020-02-13 DIAGNOSIS — J90 Pleural effusion, not elsewhere classified: Secondary | ICD-10-CM | POA: Diagnosis not present

## 2020-02-13 DIAGNOSIS — R509 Fever, unspecified: Secondary | ICD-10-CM | POA: Diagnosis not present

## 2020-02-13 LAB — LACTIC ACID, PLASMA: Lactic Acid, Venous: 1.6 mmol/L (ref 0.5–1.9)

## 2020-02-13 LAB — CBC WITH DIFFERENTIAL/PLATELET
Abs Immature Granulocytes: 0.03 10*3/uL (ref 0.00–0.07)
Basophils Absolute: 0 10*3/uL (ref 0.0–0.1)
Basophils Relative: 0 %
Eosinophils Absolute: 0.1 10*3/uL (ref 0.0–0.5)
Eosinophils Relative: 1 %
HCT: 24.5 % — ABNORMAL LOW (ref 36.0–46.0)
Hemoglobin: 7.6 g/dL — ABNORMAL LOW (ref 12.0–15.0)
Immature Granulocytes: 0 %
Lymphocytes Relative: 31 %
Lymphs Abs: 2.6 10*3/uL (ref 0.7–4.0)
MCH: 30.6 pg (ref 26.0–34.0)
MCHC: 31 g/dL (ref 30.0–36.0)
MCV: 98.8 fL (ref 80.0–100.0)
Monocytes Absolute: 0.6 10*3/uL (ref 0.1–1.0)
Monocytes Relative: 8 %
Neutro Abs: 4.9 10*3/uL (ref 1.7–7.7)
Neutrophils Relative %: 60 %
Platelets: 240 10*3/uL (ref 150–400)
RBC: 2.48 MIL/uL — ABNORMAL LOW (ref 3.87–5.11)
RDW: 14.2 % (ref 11.5–15.5)
WBC: 8.3 10*3/uL (ref 4.0–10.5)
nRBC: 0 % (ref 0.0–0.2)

## 2020-02-13 LAB — COMPREHENSIVE METABOLIC PANEL
ALT: 17 U/L (ref 0–44)
AST: 24 U/L (ref 15–41)
Albumin: 3 g/dL — ABNORMAL LOW (ref 3.5–5.0)
Alkaline Phosphatase: 61 U/L (ref 38–126)
Anion gap: 10 (ref 5–15)
BUN: 19 mg/dL (ref 8–23)
CO2: 25 mmol/L (ref 22–32)
Calcium: 8.6 mg/dL — ABNORMAL LOW (ref 8.9–10.3)
Chloride: 103 mmol/L (ref 98–111)
Creatinine, Ser: 1.26 mg/dL — ABNORMAL HIGH (ref 0.44–1.00)
GFR calc Af Amer: 48 mL/min — ABNORMAL LOW (ref >60)
GFR calc non Af Amer: 42 mL/min — ABNORMAL LOW (ref >60)
Glucose, Bld: 114 mg/dL — ABNORMAL HIGH (ref 70–99)
Potassium: 4.2 mmol/L (ref 3.5–5.1)
Sodium: 138 mmol/L (ref 135–145)
Total Bilirubin: 0.3 mg/dL (ref 0.3–1.2)
Total Protein: 10.1 g/dL — ABNORMAL HIGH (ref 6.5–8.1)

## 2020-02-13 LAB — SARS CORONAVIRUS 2 BY RT PCR (HOSPITAL ORDER, PERFORMED IN ~~LOC~~ HOSPITAL LAB): SARS Coronavirus 2: NEGATIVE

## 2020-02-13 MED ORDER — OXYCODONE HCL 5 MG PO TABS: 5.0000 mg | ORAL_TABLET | ORAL | 0 refills | Status: DC | PRN

## 2020-02-13 MED ORDER — ONDANSETRON HCL 4 MG/2ML IJ SOLN
4.0000 mg | Freq: Once | INTRAMUSCULAR | Status: AC
  Administered 2020-02-13: 4 mg via INTRAVENOUS
  Filled 2020-02-13: qty 2

## 2020-02-13 MED ORDER — HYDROMORPHONE HCL 1 MG/ML IJ SOLN
1.0000 mg | Freq: Once | INTRAMUSCULAR | Status: AC
  Administered 2020-02-13: 1 mg via INTRAVENOUS
  Filled 2020-02-13: qty 1

## 2020-02-13 MED ORDER — SODIUM CHLORIDE 0.9 % IV BOLUS
500.0000 mL | Freq: Once | INTRAVENOUS | Status: AC
  Administered 2020-02-13: 500 mL via INTRAVENOUS

## 2020-02-13 NOTE — ED Notes (Signed)
Pt BIB EMS; pt has hx of bone cancer and had a new IV tx on Friday. Pt was told if she started having any reactions to call 911. Pt reports having pain all over, weakness and dizziness. Pt took hydrocodone at 0800 and stated it helped a little but pain came back.   HR-70 R-22 O2-96 Temp-97.5 BG-135

## 2020-02-13 NOTE — ED Provider Notes (Signed)
[place] [facility] DEPT Provider Note   CSN: [id number] Arrival date & time: 02/13/20  1300     History Chief Complaint  Patient presents with  . Allergic Reaction    Tracy [name] is a 74 y.o. female.  74 year old female with complaint of cough (productive, white sputum), with body aches, onset yesterday. Denies fevers, chills, nausea, vomiting, chest pain, SHOB, diarrhea, loss of smell or taste, sick contacts. Patient took Norco at home without improvement in her pain which prompted her to come to the ER. Patient has a history of multiple myeloma, received her last treatment on Friday (Darzalex, not on chemo, also given Decadron), unsure if this is related to her pain. No other complaints or concerns.         Past Medical History:  Diagnosis Date  . Anxiety   . Arthritis   . Breast cancer ([place]) 06/13/15  . Cancer ([place]) 2000   breast cancer  . Chronic bronchitis ([place])   . Chronic bronchitis ([place])   . Hyperlipidemia   . Hypertension   . Myocardial infarction ([place]) 2001  . Personal history of radiation therapy   . Restless leg   . Stroke [facility]) 2004   TIA, no deficits    Patient Active Problem List   Diagnosis Date Noted  . Counseling regarding advance care planning and goals of care 02/07/2020  . Multiple myeloma ([place]) 01/25/2020  . Allergic rhinitis 08/19/2018  . Type 2 diabetes mellitus with diabetic neuropathy, unspecified ([place]) 11/05/2017  . Encounter for long-term use of muscle relaxants 09/24/2017  . CAD in native artery 09/24/2017  . OSA (obstructive sleep apnea) 09/24/2017  . Snorings 09/24/2017  . Morbid obesity ([place]) 06/02/2017  . Depression 06/02/2017  . Iron deficiency anemia 09/23/2016  . Anemia of chronic disease 09/28/2015  . Genetic testing 08/21/2015  . History of left breast cancer 07/13/2015  . History of right breast cancer 06/20/2015  . Hearing loss due to cerumen impaction 12/29/2014  . Allergy to adhesive tape  05/19/2014  . Hyperlipidemia 12/06/2013  . GERD (gastroesophageal reflux disease) 12/06/2013  . Cervical disc disease 11/11/2013  . Osteopenia 05/25/2013  . Allergic asthma 12/24/2012  . Insomnia 04/02/2012  . Physical exam, annual 04/02/2012  . HTN (hypertension) 02/03/2012  . Vertigo, benign positional 02/03/2012  . Left groin pain 02/03/2012    Past Surgical History:  Procedure Laterality Date  . ABDOMINAL HYSTERECTOMY  1985  . BREAST LUMPECTOMY Left 2000   radiation and chemo  . BREAST LUMPECTOMY Right 2016   radiation  . BREAST SURGERY  2001   lt breast lumpectomy  . RADIOACTIVE SEED GUIDED PARTIAL MASTECTOMY WITH AXILLARY SENTINEL LYMPH NODE BIOPSY Right 07/07/2015   Procedure: RIGHT RADIOACTIVE SEED GUIDED PARTIAL MASTECTOMY WITH AXILLARY SENTINEL LYMPH NODE BIOPSY;  Surgeon: Autumn Messing III, MD;  Location: [facility];  Service: General;  Laterality: Right;  . SMALL INTESTINE SURGERY    . TUBAL LIGATION       OB History   No obstetric history on file.     Family History  Problem Relation Age of Onset  . Diabetes Father   . Lung cancer Sister        dx. <50; former smoker  . Diabetes Brother   . Diabetes Paternal Aunt   . Stroke Maternal Grandmother   . Diabetes Paternal Grandmother   . Emphysema Mother 44       smoker  . Diabetes Brother   . Brain  cancer Brother 39       unknown tumor type  . Cancer Daughter 24       neck cancer  . Other Daughter        hysterectomy for unspecified reason  . Breast cancer Cousin   . Cancer Cousin        unspecified type  . Breast cancer Other        triple negative breast cancer in her 45s  . Cancer Daughter   . Colon polyps Neg Hx   . Esophageal cancer Neg Hx   . Gallbladder disease Neg Hx     Social History   Tobacco Use  . Smoking status: Former Smoker    Packs/day: 1.00    Years: 20.00    Pack years: 20.00    Types: Cigarettes    Quit date: 07/30/2011    Years since quitting: 8.5  .  Smokeless tobacco: Never Used  . Tobacco comment: Quit >4 years ago; 1 ppd for about 5/20 years (remaining was less)  Vaping Use  . Vaping Use: Former  Substance Use Topics  . Alcohol use: No    Alcohol/week: 0.0 standard drinks  . Drug use: No    Home Medications Prior to Admission medications   Medication Sig Start Date End Date Taking? Authorizing Provider  acyclovir (ZOVIRAX) 400 MG tablet Take 1 tablet (400 mg total) by mouth 2 (two) times daily. 02/07/20  Yes [name], MD  aspirin 81 MG tablet Take 81 mg by mouth daily.   Yes [provider]  atorvastatin (LIPITOR) 10 MG tablet TAKE 1 TABLET BY MOUTH  DAILY 05/12/19  Yes [name], MD  dexamethasone (DECADRON) 4 MG tablet Take 3 tabs with breakfast the day after each treatment. 02/07/20  Yes [name], MD  ferrous sulfate 325 (65 FE) MG tablet Take 1 tablet (325 mg total) by mouth daily with breakfast. 01/20/18  Yes [name], MD  gabapentin (NEURONTIN) 300 MG capsule Take 1 capsule (300 mg total) by mouth 3 (three) times daily. 11/03/19  Yes [name], MD  HYDROcodone-acetaminophen (NORCO) 5-325 MG tablet Take 1 tablet by mouth every 6 (six) hours as needed for moderate pain. 02/01/20  Yes [name], MD  loratadine (CLARITIN) 10 MG tablet Take 10 mg by mouth daily.   Yes [provider]  LORazepam (ATIVAN) 0.5 MG tablet Take 1 tablet (0.5 mg total) by mouth every 6 (six) hours as needed (Nausea or vomiting). 02/07/20  Yes [name], MD  losartan (COZAAR) 100 MG tablet TAKE 1 TABLET BY MOUTH  DAILY 05/12/19  Yes [name], MD  ondansetron (ZOFRAN) 8 MG tablet Take 1 tablet (8 mg total) by mouth 2 (two) times daily as needed (Nausea or vomiting). 02/07/20  Yes [name], MD  pantoprazole (PROTONIX) 40 MG tablet Take 1 tablet (40 mg total) by mouth 2 (two) times daily. 11/03/19  Yes [name], MD  prochlorperazine (COMPAZINE)  10 MG tablet Take 1 tablet (10 mg total) by mouth every 6 (six) hours as needed (Nausea or vomiting). 02/07/20  Yes [name], MD  sertraline (ZOLOFT) 50 MG tablet TAKE 1 TABLET(50 MG) BY MOUTH DAILY Patient taking differently: Take 25 mg by mouth daily. TAKE 1 TABLET(50 MG) BY MOUTH DAILY 01/11/20  Yes [name], MD  tiZANidine (ZANAFLEX) 4 MG tablet Take 1 tablet (4 mg total) by mouth at bedtime. 11/26/19  Yes [name]  E, MD  traZODone (DESYREL) 100 MG tablet TAKE 1 TABLET BY MOUTH AT  BEDTIME 12/09/19  Yes [name], MD  Accu-Chek Softclix Lancets lancets Use as instructed to check sugars 1-2 times daily. 02/10/20   [name], MD  glucose blood (ACCU-CHEK GUIDE) test strip Use as instructed to check sugars 1-2 times daily. 02/10/20   [name], MD  MELATONIN PO Take by mouth. Patient not taking: Reported on 01/27/2020    [provider]  oxyCODONE (OXY IR/ROXICODONE) 5 MG immediate release tablet Take 1 tablet (5 mg total) by mouth every 4 (four) hours as needed for severe pain. 02/13/20   Tacy Learn, PA-C    Allergies    Bacitracin-neomycin-polymyxin  [neomycin-bacitracin zn-polymyx], Nsaids, Tape, Ambien [zolpidem tartrate], Amoxicillin, Contrast media [iodinated diagnostic agents], Latex, and Prednisone  Review of Systems   Review of Systems  Constitutional: Negative for chills, diaphoresis and fever.  HENT: Negative for congestion.   Respiratory: Positive for cough. Negative for chest tightness and shortness of breath.   Cardiovascular: Negative for chest pain and leg swelling.  Gastrointestinal: Negative for abdominal pain, constipation, diarrhea, nausea and vomiting.  Genitourinary: Negative for dysuria and frequency.  Musculoskeletal: Positive for arthralgias, back pain and myalgias. Negative for joint swelling.  Skin: Negative for rash and wound.  Allergic/Immunologic: Positive for immunocompromised state.    Neurological: Negative for weakness.  Psychiatric/Behavioral: Negative for confusion.  All other systems reviewed and are negative.   Physical Exam Updated Vital Signs BP (!) 146/100 (BP Location: Right Arm)   Pulse 64   Temp 98.7 F (37.1 C) (Oral)   Resp 20   Ht 5' (1.524 m)   Wt 88 kg   SpO2 98%   BMI 37.89 kg/m   Physical Exam Vitals and nursing note reviewed.  Constitutional:      General: She is not in acute distress.    Appearance: She is well-developed. She is not diaphoretic.  HENT:     Head: Normocephalic and atraumatic.     Mouth/Throat:     Mouth: Mucous membranes are moist.  Cardiovascular:     Rate and Rhythm: Normal rate and regular rhythm.     Pulses: Normal pulses.     Heart sounds: Normal heart sounds.  Pulmonary:     Effort: Pulmonary effort is normal.     Breath sounds: Normal breath sounds.  Abdominal:     Palpations: Abdomen is soft.     Tenderness: There is no abdominal tenderness.  Musculoskeletal:     Cervical back: Neck supple.     Right lower leg: No edema.     Left lower leg: No edema.  Lymphadenopathy:     Cervical: No cervical adenopathy.  Skin:    General: Skin is warm and dry.     Findings: No erythema or rash.  Neurological:     Mental Status: She is alert and oriented to person, place, and time.  Psychiatric:        Behavior: Behavior normal.     ED Results / Procedures / Treatments   Labs (all labs ordered are listed, but only abnormal results are displayed) Labs Reviewed  COMPREHENSIVE METABOLIC PANEL - Abnormal; Notable for the following components:      Result Value   Glucose, Bld 114 (*)    Creatinine, Ser 1.26 (*)    Calcium 8.6 (*)    Total Protein 10.1 (*)    Albumin 3.0 (*)    GFR calc  non Af Amer 42 (*)    GFR calc Af Amer 48 (*)    All other components within normal limits  CBC WITH DIFFERENTIAL/PLATELET - Abnormal; Notable for the following components:   RBC 2.48 (*)    Hemoglobin 7.6 (*)    HCT 24.5  (*)    All other components within normal limits  SARS CORONAVIRUS 2 BY RT PCR [facility] ORDER, [place] LAB)  LACTIC ACID, PLASMA    EKG EKG Interpretation  Date/Time:  Sunday February 13 2020 13:27:14 EDT Ventricular Rate:  73 PR Interval:    QRS Duration: 87 QT Interval:  406 QTC Calculation: 448 R Axis:   19 Text Interpretation: Sinus rhythm Artifact Otherwise within normal limits Confirmed by Carmin Muskrat [phone]) on 02/13/2020 1:29:44 PM   Radiology DG Chest Port 1 View  Result Date: 02/13/2020 CLINICAL DATA:  Pt is c/o productive cough onset yesterday. Denies fever. Denies chest pain but is having pain to throat. H/o Breast Cancer, Chronic Bronchitis, Myocardial Infarction, HTN, Stroke. EXAM: PORTABLE CHEST 1 VIEW COMPARISON:  Chest radiograph 10/12/2018 FINDINGS: Stable cardiomediastinal contours aortic atherosclerotic calcification. Lungs are hyperinflated. Chronic diffuse bilateral coarse interstitial opacities. No new focal consolidation. No pneumothorax or large pleural effusion. No acute finding in the visualized skeleton. IMPRESSION: Chronic bronchitic changes.  No new consolidation. Electronically Signed   By: Audie Pinto M.D.   On: 02/13/2020 14:02    Procedures Procedures (including critical care time)  Medications Ordered in ED Medications  ondansetron (ZOFRAN) injection 4 mg (4 mg Intravenous Given 02/13/20 1403)  HYDROmorphone (DILAUDID) injection 1 mg (1 mg Intravenous Given 02/13/20 1358)  sodium chloride 0.9 % bolus 500 mL (500 mLs Intravenous New Bag/Given 02/13/20 1400)    ED Course  I have reviewed the triage vital signs and the nursing notes.  Pertinent labs & imaging results that were available during my care of the patient were reviewed by me and considered in my medical decision making (see chart for details).  Clinical Course as of Feb 13 1532  Sun Feb 12, 5733  861 74 year old female with history of multiple myeloma,  recently started a new treatment and now with generalized body aches.  Patient took 1 Norco tablet at home without relief of her pain and came to the ER for evaluation.  No other complaints.  On exam, has generalized tenderness with palpation without edema, no joint swelling. Labs without significant changes from baseline including CBC, CMP, lactic acid is 1.6. Patient was given IV fluids and Dilaudid.  Pain has improved, patient does have a history of sleep apnea, fell asleep after pain medication and oxygen saturations dropped down into the 80s, she was placed on nasal cannula.  At this time, patient is awake, maintaining her own stable O2 sat on room air. Discussed pain management at home with patient, she can take her Norco as needed as prescribed, given prescription for OxyIR to take in addition for pain not controlled with her Norco.  Recommend patient contact her prescriber tomorrow to discuss pain management further, return to ER as needed.  Case was discussed with Dr. Vanita Panda, ER attending, agrees with plan of care.   [LM]    Clinical Course User Index [LM] Roque Lias   MDM Rules/Calculators/A&P                          Final Clinical Impression(s) / ED Diagnoses Final diagnoses:  Generalized body  aches    Rx / DC Orders ED Discharge Orders         Ordered    oxyCODONE (OXY IR/ROXICODONE) 5 MG immediate release tablet  Every 4 hours PRN     Discontinue  Reprint     02/13/20 1527           Tacy Learn, PA-C 02/13/20 1533    Carmin Muskrat, MD 02/14/20 [phone]

## 2020-02-13 NOTE — Discharge Instructions (Addendum)
Follow-up with your oncologist tomorrow. Take your Norco as needed as prescribed by your provider for pain.  Take oxycodone as needed as prescribed for pain not controlled with your Norco.  These medications can cause constipation, take Colace and MiraLAX or milk of magnesia as needed for constipation.  Return to ER for worsening or concerning symptoms.

## 2020-02-14 ENCOUNTER — Telehealth: Payer: Self-pay | Admitting: *Deleted

## 2020-02-14 ENCOUNTER — Other Ambulatory Visit (HOSPITAL_COMMUNITY): Payer: Medicare Other

## 2020-02-14 LAB — TYPE AND SCREEN
ABO/RH(D): A POS
Antibody Screen: NEGATIVE
Unit division: 0
Unit division: 0

## 2020-02-14 LAB — BPAM RBC
Blood Product Expiration Date: 202108072359
Blood Product Expiration Date: 202108072359
Unit Type and Rh: 6200
Unit Type and Rh: 6200

## 2020-02-14 NOTE — Telephone Encounter (Signed)
Please advise, per note Dr. [name] D/C this last month

## 2020-02-14 NOTE — Telephone Encounter (Signed)
Received Telephone Advice fax from after hours [facility]:  Patient called after hours nurse triage line on Saturday for sever reflux Fiday night. Had been advised in infusion on Friday not to take pantoprazole at home (patient said she received prilosec in infusion, but not noted - she did receive ondansetron). No note to hold Pantoprazole at home, so unlear on that info. Wants to know if ok with Dr. Irene Limbo to restart pantoprazole.  Patient called to notify Dr. Irene Limbo that she went to the ED on Sunday for really bad pain. ED staff recommended she call this office on Monday. Went to ED on Sunday with c/o generalized pain. Had taken 1 Norco at home without relief. Patient was advised to take her Norco as needed as prescribed, given prescription for OxyIR to take in addition for pain not controlled with her Norco. Dr. Irene Limbo informed. Per Dr.Kale: ok for patient to take pantoprazole, no need to hold. Can take Norco 1-2 tab every 4h or OxyIR and shall re-evaluate symptoms on Friday. Contacted patient with Dr. Grier Mitts directions - she verbalized understanding.

## 2020-02-16 NOTE — Progress Notes (Signed)
HEMATOLOGY/ONCOLOGY CLINIC NOTE  Date of Service: 02/18/2020  Patient Care Team: [name], MD as PCP - General (Family Medicine) [name], MD as Attending Physician (Physical Medicine and Rehabilitation) [name], MD as Consulting Physician (Orthopedic Surgery) [name], MD as Consulting Physician (Otolaryngology) [name], MD as Consulting Physician (Gastroenterology) [name], MD as Consulting Physician (General Surgery) [name], MD as Consulting Physician (Hematology and Oncology) [name], MD as Consulting Physician (Radiation Oncology) [name], NP as Nurse Practitioner (Hematology and Oncology) [name], [facility] as Pharmacist (Pharmacist)  CHIEF COMPLAINTS/PURPOSE OF CONSULTATION:  Multiple Myeloma not having achieved remission  HISTORY OF PRESENTING ILLNESS:   Tracy [name] is a wonderful 74 y.o. female who has been referred to [place] by Dr [name] for evaluation and management of Multiple Myeloma. The patient's last visit with [place] was on 02/10/20. The pt reports that she is doing well overall.  The pt reports she is good. Pt was very constipated last week and was not taking her Miralax daily. She did not have nausea or vomiting and had a little bit of fatigue. She went to the emergency room on Sunday due to pain all over her body where she was prescribed additional pain medication. She has not taken pain medication for several days.  Pt had chest pain and SOB but once she had a bowl movement she felt better. She has abdominal pain and back pain that is sensitive to the touch.   Lab results today (02/18/20) of CBC w/diff and CMP is as follows: all values are WNL except for RBC at 2.34, Hemoglobin at 7.1, HCT at 22.1, Glucose at 111, Creatinine at 1.35, Calcium at 8.8, Total Protein at 10, Albumin at 2.7, AST at 14, GFR, Est Non Af Am at 38, GFR, Est AFR Am at 44  On review of systems, pt reports fatigue, generalized  body aches, off balance feeling, abdominal tightness and denies nausea, vomiting, black/blood in stool, lightheadedness, dizziness,  chest pain, SOB and any other symptoms.   MEDICAL HISTORY:  Past Medical History:  Diagnosis Date   Anxiety    Arthritis    Breast cancer ([place]) 06/13/15   Cancer ([place]) 2000   breast cancer   Chronic bronchitis ([place])    Chronic bronchitis ([place])    Hyperlipidemia    Hypertension    Myocardial infarction [facility]) 2001   Personal history of radiation therapy    Restless leg    Stroke ([place]) 2004   TIA, no deficits    SURGICAL HISTORY: Past Surgical History:  Procedure Laterality Date   ABDOMINAL HYSTERECTOMY  1985   BREAST LUMPECTOMY Left 2000   radiation and chemo   BREAST LUMPECTOMY Right 2016   radiation   BREAST SURGERY  2001   lt breast lumpectomy   RADIOACTIVE SEED GUIDED PARTIAL MASTECTOMY WITH AXILLARY SENTINEL LYMPH NODE BIOPSY Right 07/07/2015   Procedure: RIGHT RADIOACTIVE SEED GUIDED PARTIAL MASTECTOMY WITH AXILLARY SENTINEL LYMPH NODE BIOPSY;  Surgeon: Autumn Messing III, MD;  Location: [place] [facility];  Service: General;  Laterality: Right;   SMALL INTESTINE SURGERY     TUBAL LIGATION      SOCIAL HISTORY: Social History   Socioeconomic History   Marital status: Divorced    Spouse name: Not on file   Number of children: 7   Years of education: Not on file   Highest education level: Not on file  Occupational History   Occupation: retired  Tobacco Use  Smoking status: Former Smoker    Packs/day: 1.00    Years: 20.00    Pack years: 20.00    Types: Cigarettes    Quit date: 07/30/2011    Years since quitting: 8.5   Smokeless tobacco: Never Used   Tobacco comment: Quit >4 years ago; 1 ppd for about 5/20 years (remaining was less)  Vaping Use   Vaping Use: Former  Substance and Sexual Activity   Alcohol use: No    Alcohol/week: 0.0 standard drinks   Drug use: No   Sexual activity: Not on  file  Other Topics Concern   Not on file  Social History Narrative   Lives alone.  Retired.  Education:  11th grade GED.  Children:  7 (one here).    Social Determinants of Health   Financial Resource Strain:    Difficulty of Paying Living Expenses:   Food Insecurity: No Food Insecurity   Worried About Charity fundraiser in the Last Year: Never true   Ran Out of Food in the Last Year: Never true  Transportation Needs: No Transportation Needs   Lack of Transportation (Medical): No   Lack of Transportation (Non-Medical): No  Physical Activity:    Days of Exercise per Week:    Minutes of Exercise per Session:   Stress:    Feeling of Stress :   Social Connections:    Frequency of Communication with Friends and Family:    Frequency of Social Gatherings with Friends and Family:    Attends Religious Services:    Active Member of Clubs or Organizations:    Attends Music therapist:    Marital Status:   Intimate Partner Violence:    Fear of Current or Ex-Partner:    Emotionally Abused:    Physically Abused:    Sexually Abused:     FAMILY HISTORY: Family History  Problem Relation Age of Onset   Diabetes Father    Lung cancer Sister        dx. <50; former smoker   Diabetes Brother    Diabetes Paternal Aunt    Stroke Maternal Grandmother    Diabetes Paternal Grandmother    Emphysema Mother 36       smoker   Diabetes Brother    Brain cancer Brother 59       unknown tumor type   Cancer Daughter 76       neck cancer   Other Daughter        hysterectomy for unspecified reason   Breast cancer Cousin    Cancer Cousin        unspecified type   Breast cancer Other        triple negative breast cancer in her 39s   Cancer Daughter    Colon polyps Neg Hx    Esophageal cancer Neg Hx    Gallbladder disease Neg Hx     ALLERGIES:  is allergic to bacitracin-neomycin-polymyxin  [neomycin-bacitracin zn-polymyx], nsaids, tape,  ambien [zolpidem tartrate], amoxicillin, contrast media [iodinated diagnostic agents], latex, and prednisone.  MEDICATIONS:  Current Outpatient Medications  Medication Sig Dispense Refill   Accu-Chek Softclix Lancets lancets Use as instructed to check sugars 1-2 times daily. 100 each 12   acyclovir (ZOVIRAX) 400 MG tablet Take 1 tablet (400 mg total) by mouth 2 (two) times daily. 60 tablet 11   aspirin 81 MG tablet Take 81 mg by mouth daily.     atorvastatin (LIPITOR) 10 MG tablet TAKE 1 TABLET BY MOUTH  DAILY 90 tablet 3   dexamethasone (DECADRON) 4 MG tablet Take 3 tabs with breakfast the day after each treatment. 20 tablet 4   ferrous sulfate 325 (65 FE) MG tablet Take 1 tablet (325 mg total) by mouth daily with breakfast. 30 tablet 6   gabapentin (NEURONTIN) 300 MG capsule Take 1 capsule (300 mg total) by mouth 3 (three) times daily. 270 capsule 1   glucose blood (ACCU-CHEK GUIDE) test strip Use as instructed to check sugars 1-2 times daily. 100 each 12   HYDROcodone-acetaminophen (NORCO) 5-325 MG tablet Take 1 tablet by mouth every 6 (six) hours as needed for moderate pain. 30 tablet 0   loratadine (CLARITIN) 10 MG tablet Take 10 mg by mouth daily.     LORazepam (ATIVAN) 0.5 MG tablet Take 1 tablet (0.5 mg total) by mouth every 6 (six) hours as needed (Nausea or vomiting). 30 tablet 0   losartan (COZAAR) 100 MG tablet TAKE 1 TABLET BY MOUTH  DAILY 90 tablet 3   MELATONIN PO Take by mouth.      ondansetron (ZOFRAN) 8 MG tablet Take 1 tablet (8 mg total) by mouth 2 (two) times daily as needed (Nausea or vomiting). 30 tablet 1   oxyCODONE (OXY IR/ROXICODONE) 5 MG immediate release tablet Take 1 tablet (5 mg total) by mouth every 4 (four) hours as needed for severe pain. 10 tablet 0   pantoprazole (PROTONIX) 40 MG tablet Take 1 tablet (40 mg total) by mouth 2 (two) times daily. 60 tablet 3   prochlorperazine (COMPAZINE) 10 MG tablet Take 1 tablet (10 mg total) by mouth every 6  (six) hours as needed (Nausea or vomiting). 30 tablet 1   sertraline (ZOLOFT) 50 MG tablet TAKE 1 TABLET(50 MG) BY MOUTH DAILY (Patient taking differently: Take 25 mg by mouth daily. TAKE 1 TABLET(50 MG) BY MOUTH DAILY) 90 tablet 0   tiZANidine (ZANAFLEX) 4 MG tablet Take 1 tablet (4 mg total) by mouth at bedtime. 30 tablet 3   traZODone (DESYREL) 100 MG tablet TAKE 1 TABLET BY MOUTH AT  BEDTIME 90 tablet 3   No current facility-administered medications for this visit.   Facility-Administered Medications Ordered in Other Visits  Medication Dose Route Frequency Provider Last Rate Last Admin   daratumumab (DARZALEX) 1,400 mg in sodium chloride 0.9 % 430 mL (2.8 mg/mL) chemo infusion  16 mg/kg (Treatment Plan Recorded) Intravenous Once [name], MD       dexamethasone (DECADRON) 20 mg in sodium chloride 0.9 % 50 mL IVPB  20 mg Intravenous Once [name], MD 208 mL/hr at 02/18/20 1259 20 mg at 02/18/20 1259   ferric carboxymaltose (INJECTAFER) 750 mg in sodium chloride 0.9 % 250 mL IVPB  750 mg Intravenous Once [name], MD        REVIEW OF SYSTEMS:   A 10+ POINT REVIEW OF SYSTEMS WAS OBTAINED including neurology, dermatology, psychiatry, cardiac, respiratory, lymph, extremities, GI, GU, Musculoskeletal, constitutional, breasts, reproductive, HEENT.  All pertinent positives are noted in the HPI.  All others are negative.   PHYSICAL EXAMINATION: ECOG PERFORMANCE STATUS: 2 - Symptomatic, <50% confined to bed  . Vitals:   02/18/20 1107  BP: (!) 166/68  Pulse: 80  Resp: 18  Temp: 98.1 F (36.7 C)  SpO2: 97%   Filed Weights   02/18/20 1107  Weight: 191 lb 4.8 oz (86.8 kg)   .Body mass index is 37.36 kg/m.  Exam was given in a wheel chair  GENERAL:alert, in no acute distress and comfortable SKIN: no acute rashes, no significant lesions EYES: conjunctiva are pink and non-injected, sclera anicteric OROPHARYNX: MMM, no exudates, no oropharyngeal  erythema or ulceration NECK: supple, no JVD LYMPH:  no palpable lymphadenopathy in the cervical, axillary or inguinal regions LUNGS: clear to auscultation b/l with normal respiratory effort HEART: regular rate & rhythm ABDOMEN:  normoactive bowel sounds , non tender, not distended. Extremity: no pedal edema PSYCH: alert & oriented x 3 with fluent speech NEURO: no focal motor/sensory deficits  LABORATORY DATA:  I have reviewed the data as listed  . CBC Latest Ref Rng & Units 02/18/2020 02/13/2020 02/10/2020  WBC 4.0 - 10.5 K/uL 6.1 8.3 6.5  Hemoglobin 12.0 - 15.0 g/dL 7.1(L) 7.6(L) 8.4(L)  Hematocrit 36 - 46 % 22.1(L) 24.5(L) 26.6(L)  Platelets 150 - 400 K/uL 273 240 277    . CMP Latest Ref Rng & Units 02/18/2020 02/13/2020 02/10/2020  Glucose 70 - 99 mg/dL 111(H) 114(H) 125(H)  BUN 8 - 23 mg/dL '14 19 14  ' Creatinine 0.44 - 1.00 mg/dL 1.35(H) 1.26(H) 1.40(H)  Sodium 135 - 145 mmol/L 139 138 137  Potassium 3.5 - 5.1 mmol/L 3.6 4.2 4.2  Chloride 98 - 111 mmol/L 108 103 107  CO2 22 - 32 mmol/L '22 25 23  ' Calcium 8.9 - 10.3 mg/dL 8.8(L) 8.6(L) 8.9  Total Protein 6.5 - 8.1 g/dL 10.0(H) 10.1(H) 10.9(H)  Total Bilirubin 0.3 - 1.2 mg/dL 0.3 0.3 0.2(L)  Alkaline Phos 38 - 126 U/L 85 61 88  AST 15 - 41 U/L 14(L) 24 14(L)  ALT 0 - 44 U/L '10 17 11   ' 01/25/2020 K/L light chains:    01/25/2020 MMP:            RADIOGRAPHIC STUDIES: I have personally reviewed the radiological images as listed and agreed with the findings in the report. NM PET Image Initial (PI) Skull Base To Thigh  Result Date: 01/20/2020 CLINICAL DATA:  Initial treatment strategy for lytic bone lesions. Cancer of unknown primary. COVID vaccine in right arm in May. EXAM: NUCLEAR MEDICINE PET SKULL BASE TO THIGH TECHNIQUE: 9.6 mCi F-18 FDG was injected intravenously. Full-ring PET imaging was performed from the skull base to thigh after the radiotracer. CT data was obtained and used for attenuation correction and  anatomic localization. Fasting blood glucose: 121 mg/dl COMPARISON:  Abdominopelvic CT of 01/07/2020. FINDINGS: Mediastinal blood pool activity: SUV max 3.3 Liver activity: SUV max NA NECK: No areas of abnormal hypermetabolism. Incidental CT findings: No cervical adenopathy. CHEST: No pulmonary parenchymal or thoracic nodal hypermetabolism. Incidental CT findings: Aortic atherosclerosis. Mild cardiomegaly. Right axillary node dissection. Moderate centrilobular emphysema. ABDOMEN/PELVIS: No abdominopelvic parenchymal or nodal hypermetabolism. Incidental CT findings: Deferred to abdominopelvic CT. Hepatic steatosis. Normal adrenal glands. Abdominal aortic atherosclerosis. Contrast within the colon related to prior esophagram. SKELETON: Hypermetabolism corresponding to the posterior left acetabular lesion. 3.8 cm and a S.U.V. max of 5.5 on 169/4. Eccentric right L5 lesion measures 2.9 cm and a S.U.V. max of 4.6 on 137/4. Right humeral head hypermetabolism without well-defined CT correlate. On the order of a S.U.V. max of 4.1. Incidental CT findings: Right-sided surgical changes of lumpectomy with skin thickening, likely radiation induced. IMPRESSION: 1. Hypermetabolism corresponding to left acetabular and L5 lytic lesions, with differential considerations of metastatic disease or myeloma. 2. No evidence of hypermetabolic soft tissue primary, metastatic disease, or soft tissue myeloma. 3. Incidental findings, including: Aortic atherosclerosis (ICD10-I70.0) and emphysema (ICD10-J43.9). Hepatic steatosis. Electronically Signed  By: Abigail Miyamoto M.D.   On: 01/20/2020 12:48   DG Chest Port 1 View  Result Date: 02/13/2020 CLINICAL DATA:  Pt is c/o productive cough onset yesterday. Denies fever. Denies chest pain but is having pain to throat. H/o Breast Cancer, Chronic Bronchitis, Myocardial Infarction, HTN, Stroke. EXAM: PORTABLE CHEST 1 VIEW COMPARISON:  Chest radiograph 10/12/2018 FINDINGS: Stable cardiomediastinal  contours aortic atherosclerotic calcification. Lungs are hyperinflated. Chronic diffuse bilateral coarse interstitial opacities. No new focal consolidation. No pneumothorax or large pleural effusion. No acute finding in the visualized skeleton. IMPRESSION: Chronic bronchitic changes.  No new consolidation. Electronically Signed   By: Audie Pinto M.D.   On: 02/13/2020 14:02    ASSESSMENT & PLAN:   74 yo with   1) Newly diagnosed IgG Kappa Multiple myeloma with bone lesions, anemia, renal insuff. M spike @ 3.7g/dl 2) h/o Dm2 3) Diabetic Neuropathy 4) CKD - likely from DM2, but could have an element of myeloma kidney. 5) h/o TIA and AMI 6) Iron deficiency  PLAN: -Discussed pt labwork today, 02/18/20; of CBC w/diff and CMP is as follows: all values are WNL except for RBC at 2.34, Hemoglobin at 7.1, HCT at 22.1, Glucose at 111, Creatinine at 1.35, Calcium at 8.8, Total Protein at 10, Albumin at 2.7, AST at 14, GFR, Est Non Af Am at 38, GFR, Est AFR Am at 44 --Pt is here for C1D8 of Daratumumab and has no prohibitive toxicities neceesitating treatment change. -Goal hemoglobin at >8 -Discussed CRAB criteria: no hypercalcemia, minor renal dysfunction that can be otherwise explained, significant anemia, bone lesions identified on scan  -Advised myeloma can cause generalized body aches and pains -Advised pt is anemic due to myeloma and iron deficiency  - possibility of blood transfusion and IV iron as needed -Advised iron deficiency due to bleeding  -Advised pt that she has 1p deletion and a polymorphic variant which suggest a more aggressive disease and 13q deletion which suggest a less agressive form of disease.  -Advised pt that steroids used with treatment will increase her blood glucose levels. She must work with her PCP or Endocrinologist to tightly control her blood glucose levels.  -Advised pt that Immunomodulators increase the risk of blood clots, heart attacks, and strokes; proteasome  inhibitors can worsen neuropathy. These will need to be factored in during treatment considerations.  -Recommend pt avoid bending, twisting, lifting, pushing, or pulling anything heavy  -Recommend pt f/u with PCP for coordination of care for diabetes control  -Continue taking Miralax and Senna  -Will prescribe magnesium citrate -Will refer pt to Radiation Oncology for RT of symptomatic lesions -Will continue pt on Daratumumab + Dexamethasone -Will see back in 2 week with C1D22 -Bisphosphosphonates/Xgeva after dental clearance.  FOLLOW UP: -IV Iron with Daratumumab today and plz add with next treatment as well -PRBC x 1 unit today or tomorrow (if these are not possible then Monday) -C1D15 on 7/30 with labs -Plz add MD visit in 2 weeks with C1D22 on 03/03/2020 -referral to radiation oncology for consideration of palliative RT to symptomatic bone lesion from myeloma  The total time spent in the appt was 30 minutes and more than 50% was on counseling and direct patient cares.  All of the patient's questions were answered with apparent satisfaction. The patient knows to call the clinic with any problems, questions or concerns.  [name] MD [place] AAHIVMS [facility] Hematology/Oncology Physician [facility]  (Office):       [phone] (Work cell):  [phone] (Fax):           [phone]  02/18/2020 1:09 PM  I, Dawayne Cirri am acting as a Education administrator for Dr. [name].   .I have reviewed the above documentation for accuracy and completeness, and I agree with the above. [name] MD

## 2020-02-17 ENCOUNTER — Telehealth: Payer: Self-pay | Admitting: *Deleted

## 2020-02-17 NOTE — Telephone Encounter (Signed)
Contacted patient regarding test results per Dr. Grier Mitts directions: Please let Tracy [name] know her iron level is very low-- adding to anemia. Would recommend IV Injectafer weekly x 2. If she is okay with this we could request she get this with her next 2 dara treatments.  Patient is in agreement with plan to receive IV iron. Patient had not had BM since Saturday - states she forgot to get the Miralax. Advised OTC Magnesium Citrate today and to begin daily  Miralax tomorrow. She verbalized understanding.

## 2020-02-17 NOTE — Addendum Note (Signed)
Addended by: [name] on: 02/17/2020 12:45 AM   Modules accepted: Orders

## 2020-02-18 ENCOUNTER — Other Ambulatory Visit: Payer: Self-pay

## 2020-02-18 ENCOUNTER — Other Ambulatory Visit: Payer: Medicare Other

## 2020-02-18 ENCOUNTER — Inpatient Hospital Stay: Payer: Medicare Other

## 2020-02-18 ENCOUNTER — Other Ambulatory Visit: Payer: Self-pay | Admitting: Hematology

## 2020-02-18 ENCOUNTER — Inpatient Hospital Stay: Payer: Medicare Other | Admitting: Hematology

## 2020-02-18 ENCOUNTER — Ambulatory Visit: Payer: Medicare Other | Admitting: Hematology

## 2020-02-18 VITALS — BP 166/68 | HR 80 | Temp 98.1°F | Resp 18 | Ht 60.0 in | Wt 191.3 lb

## 2020-02-18 VITALS — BP 166/61 | HR 78 | Temp 98.3°F | Resp 18

## 2020-02-18 DIAGNOSIS — D509 Iron deficiency anemia, unspecified: Secondary | ICD-10-CM

## 2020-02-18 DIAGNOSIS — Z5112 Encounter for antineoplastic immunotherapy: Secondary | ICD-10-CM | POA: Diagnosis not present

## 2020-02-18 DIAGNOSIS — D649 Anemia, unspecified: Secondary | ICD-10-CM

## 2020-02-18 DIAGNOSIS — C9000 Multiple myeloma not having achieved remission: Secondary | ICD-10-CM

## 2020-02-18 DIAGNOSIS — Z923 Personal history of irradiation: Secondary | ICD-10-CM | POA: Diagnosis not present

## 2020-02-18 DIAGNOSIS — Z9221 Personal history of antineoplastic chemotherapy: Secondary | ICD-10-CM | POA: Diagnosis not present

## 2020-02-18 DIAGNOSIS — Z7189 Other specified counseling: Secondary | ICD-10-CM

## 2020-02-18 DIAGNOSIS — Z853 Personal history of malignant neoplasm of breast: Secondary | ICD-10-CM | POA: Diagnosis not present

## 2020-02-18 LAB — HEPATITIS C ANTIBODY: HCV Ab: NONREACTIVE

## 2020-02-18 LAB — CBC WITH DIFFERENTIAL/PLATELET
Abs Immature Granulocytes: 0.01 10*3/uL (ref 0.00–0.07)
Basophils Absolute: 0 10*3/uL (ref 0.0–0.1)
Basophils Relative: 0 %
Eosinophils Absolute: 0.1 10*3/uL (ref 0.0–0.5)
Eosinophils Relative: 1 %
HCT: 22.1 % — ABNORMAL LOW (ref 36.0–46.0)
Hemoglobin: 7.1 g/dL — ABNORMAL LOW (ref 12.0–15.0)
Immature Granulocytes: 0 %
Lymphocytes Relative: 31 %
Lymphs Abs: 1.9 10*3/uL (ref 0.7–4.0)
MCH: 30.3 pg (ref 26.0–34.0)
MCHC: 32.1 g/dL (ref 30.0–36.0)
MCV: 94.4 fL (ref 80.0–100.0)
Monocytes Absolute: 0.3 10*3/uL (ref 0.1–1.0)
Monocytes Relative: 6 %
Neutro Abs: 3.8 10*3/uL (ref 1.7–7.7)
Neutrophils Relative %: 62 %
Platelets: 273 10*3/uL (ref 150–400)
RBC: 2.34 MIL/uL — ABNORMAL LOW (ref 3.87–5.11)
RDW: 13.9 % (ref 11.5–15.5)
WBC: 6.1 10*3/uL (ref 4.0–10.5)
nRBC: 0 % (ref 0.0–0.2)

## 2020-02-18 LAB — CMP (CANCER CENTER ONLY)
ALT: 10 U/L (ref 0–44)
AST: 14 U/L — ABNORMAL LOW (ref 15–41)
Albumin: 2.7 g/dL — ABNORMAL LOW (ref 3.5–5.0)
Alkaline Phosphatase: 85 U/L (ref 38–126)
Anion gap: 9 (ref 5–15)
BUN: 14 mg/dL (ref 8–23)
CO2: 22 mmol/L (ref 22–32)
Calcium: 8.8 mg/dL — ABNORMAL LOW (ref 8.9–10.3)
Chloride: 108 mmol/L (ref 98–111)
Creatinine: 1.35 mg/dL — ABNORMAL HIGH (ref 0.44–1.00)
GFR, Est AFR Am: 44 mL/min — ABNORMAL LOW (ref >60)
GFR, Estimated: 38 mL/min — ABNORMAL LOW (ref >60)
Glucose, Bld: 111 mg/dL — ABNORMAL HIGH (ref 70–99)
Potassium: 3.6 mmol/L (ref 3.5–5.1)
Sodium: 139 mmol/L (ref 135–145)
Total Bilirubin: 0.3 mg/dL (ref 0.3–1.2)
Total Protein: 10 g/dL — ABNORMAL HIGH (ref 6.5–8.1)

## 2020-02-18 LAB — PREPARE RBC (CROSSMATCH)

## 2020-02-18 LAB — HEPATITIS B CORE ANTIBODY, TOTAL: Hep B Core Total Ab: NONREACTIVE

## 2020-02-18 LAB — HEPATITIS B SURFACE ANTIGEN: Hepatitis B Surface Ag: NONREACTIVE

## 2020-02-18 MED ORDER — DIPHENHYDRAMINE HCL 25 MG PO CAPS
ORAL_CAPSULE | ORAL | Status: AC
  Filled 2020-02-18: qty 2

## 2020-02-18 MED ORDER — FAMOTIDINE IN NACL 20-0.9 MG/50ML-% IV SOLN
INTRAVENOUS | Status: AC
  Filled 2020-02-18: qty 50

## 2020-02-18 MED ORDER — ACETAMINOPHEN 325 MG PO TABS
650.0000 mg | ORAL_TABLET | Freq: Once | ORAL | Status: AC
  Administered 2020-02-18: 650 mg via ORAL

## 2020-02-18 MED ORDER — SODIUM CHLORIDE 0.9 % IV SOLN
Freq: Once | INTRAVENOUS | Status: AC
  Filled 2020-02-18: qty 250

## 2020-02-18 MED ORDER — MONTELUKAST SODIUM 10 MG PO TABS
10.0000 mg | ORAL_TABLET | Freq: Once | ORAL | Status: AC
  Administered 2020-02-18: 10 mg via ORAL

## 2020-02-18 MED ORDER — SODIUM CHLORIDE 0.9 % IV SOLN
750.0000 mg | Freq: Once | INTRAVENOUS | Status: AC
  Administered 2020-02-18: 750 mg via INTRAVENOUS
  Filled 2020-02-18: qty 15

## 2020-02-18 MED ORDER — SODIUM CHLORIDE 0.9 % IV SOLN
20.0000 mg | Freq: Once | INTRAVENOUS | Status: AC
  Administered 2020-02-18: 20 mg via INTRAVENOUS
  Filled 2020-02-18: qty 20

## 2020-02-18 MED ORDER — FAMOTIDINE IN NACL 20-0.9 MG/50ML-% IV SOLN
20.0000 mg | Freq: Once | INTRAVENOUS | Status: AC
  Administered 2020-02-18: 20 mg via INTRAVENOUS

## 2020-02-18 MED ORDER — DIPHENHYDRAMINE HCL 25 MG PO CAPS
50.0000 mg | ORAL_CAPSULE | Freq: Once | ORAL | Status: AC
  Administered 2020-02-18: 50 mg via ORAL

## 2020-02-18 MED ORDER — MONTELUKAST SODIUM 10 MG PO TABS
ORAL_TABLET | ORAL | Status: AC
  Filled 2020-02-18: qty 1

## 2020-02-18 MED ORDER — SODIUM CHLORIDE 0.9 % IV SOLN
16.0000 mg/kg | Freq: Once | INTRAVENOUS | Status: AC
  Administered 2020-02-18: 1400 mg via INTRAVENOUS
  Filled 2020-02-18: qty 60

## 2020-02-18 MED ORDER — ACETAMINOPHEN 325 MG PO TABS
ORAL_TABLET | ORAL | Status: AC
  Filled 2020-02-18: qty 2

## 2020-02-18 NOTE — Progress Notes (Unsigned)
Cbc

## 2020-02-18 NOTE — Patient Instructions (Signed)
[facility] Discharge Instructions for Patients Receiving Chemotherapy  Today you received the following chemotherapy agents Daratumumab (DARZALEX).  To help prevent nausea and vomiting after your treatment, we encourage you to take your nausea medication as prescribed.   If you develop nausea and vomiting that is not controlled by your nausea medication, call the clinic.   BELOW ARE SYMPTOMS THAT SHOULD BE REPORTED IMMEDIATELY:  *FEVER GREATER THAN 100.5 F  *CHILLS WITH OR WITHOUT FEVER  NAUSEA AND VOMITING THAT IS NOT CONTROLLED WITH YOUR NAUSEA MEDICATION  *UNUSUAL SHORTNESS OF BREATH  *UNUSUAL BRUISING OR BLEEDING  TENDERNESS IN MOUTH AND THROAT WITH OR WITHOUT PRESENCE OF ULCERS  *URINARY PROBLEMS  *BOWEL PROBLEMS  UNUSUAL RASH Items with * indicate a potential emergency and should be followed up as soon as possible.  Feel free to call the clinic should you have any questions or concerns. The clinic phone number is [phone].  Please show the CHEMO ALERT CARD at check-in to the Emergency Department and triage nurse.  Ferric carboxymaltose injection What is this medicine? FERRIC CARBOXYMALTOSE (ferr-ik car-box-ee-mol-toes) is an iron complex. Iron is used to make healthy red blood cells, which carry oxygen and nutrients throughout the body. This medicine is used to treat anemia in people with chronic kidney disease or people who cannot take iron by mouth. This medicine may be used for other purposes; ask your health care provider or pharmacist if you have questions. COMMON BRAND NAME(S): Injectafer What should I tell my health care provider before I take this medicine? They need to know if you have any of these conditions:  high levels of iron in the blood  liver disease  an unusual or allergic reaction to iron, other medicines, foods, dyes, or preservatives  pregnant or trying to get pregnant  breast-feeding How should I use this  medicine? This medicine is for infusion into a vein. It is given by a health care professional in a hospital or clinic setting. Talk to your pediatrician regarding the use of this medicine in children. Special care may be needed. Overdosage: If you think you have taken too much of this medicine contact a poison control center or emergency room at once. NOTE: This medicine is only for you. Do not share this medicine with others. What if I miss a dose? It is important not to miss your dose. Call your doctor or health care professional if you are unable to keep an appointment. What may interact with this medicine? Do not take this medicine with any of the following medications:  deferoxamine  dimercaprol  other iron products This list may not describe all possible interactions. Give your health care provider a list of all the medicines, herbs, non-prescription drugs, or dietary supplements you use. Also tell them if you smoke, drink alcohol, or use illegal drugs. Some items may interact with your medicine. What should I watch for while using this medicine? Visit your doctor or health care professional regularly. Tell your doctor if your symptoms do not start to get better or if they get worse. You may need blood work done while you are taking this medicine. You may need to follow a special diet. Talk to your doctor. Foods that contain iron include: whole grains/cereals, dried fruits, beans, or peas, leafy green vegetables, and organ meats (liver, kidney). What side effects may I notice from receiving this medicine? Side effects that you should report to your doctor or health care professional as soon as possible:    allergic reactions like skin rash, itching or hives, swelling of the face, lips, or tongue  dizziness  facial flushing Side effects that usually do not require medical attention (report to your doctor or health care professional if they continue or are bothersome):  changes in  taste  constipation  headache  nausea, vomiting  pain, redness, or irritation at site where injected This list may not describe all possible side effects. Call your doctor for medical advice about side effects. You may report side effects to FDA at 1-800-FDA-1088. Where should I keep my medicine? This drug is given in a hospital or clinic and will not be stored at home. NOTE: This sheet is a summary. It may not cover all possible information. If you have questions about this medicine, talk to your doctor, pharmacist, or health care provider.  2020 Elsevier/Gold Standard (2016-08-29 09:40:29)  

## 2020-02-21 ENCOUNTER — Telehealth: Payer: Self-pay | Admitting: *Deleted

## 2020-02-21 ENCOUNTER — Other Ambulatory Visit: Payer: Self-pay | Admitting: Hematology

## 2020-02-21 ENCOUNTER — Other Ambulatory Visit: Payer: Self-pay | Admitting: Hematology and Oncology

## 2020-02-21 ENCOUNTER — Inpatient Hospital Stay: Payer: Medicare Other

## 2020-02-21 ENCOUNTER — Other Ambulatory Visit: Payer: Self-pay

## 2020-02-21 ENCOUNTER — Other Ambulatory Visit: Payer: Self-pay | Admitting: *Deleted

## 2020-02-21 DIAGNOSIS — D509 Iron deficiency anemia, unspecified: Secondary | ICD-10-CM

## 2020-02-21 DIAGNOSIS — Z9221 Personal history of antineoplastic chemotherapy: Secondary | ICD-10-CM | POA: Diagnosis not present

## 2020-02-21 DIAGNOSIS — D649 Anemia, unspecified: Secondary | ICD-10-CM

## 2020-02-21 DIAGNOSIS — Z853 Personal history of malignant neoplasm of breast: Secondary | ICD-10-CM | POA: Diagnosis not present

## 2020-02-21 DIAGNOSIS — Z923 Personal history of irradiation: Secondary | ICD-10-CM | POA: Diagnosis not present

## 2020-02-21 DIAGNOSIS — C9000 Multiple myeloma not having achieved remission: Secondary | ICD-10-CM

## 2020-02-21 DIAGNOSIS — Z5112 Encounter for antineoplastic immunotherapy: Secondary | ICD-10-CM | POA: Diagnosis not present

## 2020-02-21 MED ORDER — SODIUM CHLORIDE 0.9% IV SOLUTION
250.0000 mL | Freq: Once | INTRAVENOUS | Status: AC
  Administered 2020-02-21: 250 mL via INTRAVENOUS
  Filled 2020-02-21: qty 250

## 2020-02-21 MED ORDER — ACETAMINOPHEN 325 MG PO TABS
650.0000 mg | ORAL_TABLET | Freq: Once | ORAL | Status: AC
  Administered 2020-02-21: 650 mg via ORAL

## 2020-02-21 MED ORDER — DIPHENHYDRAMINE HCL 25 MG PO CAPS
ORAL_CAPSULE | ORAL | Status: AC
  Filled 2020-02-21: qty 1

## 2020-02-21 MED ORDER — HYDROCODONE-ACETAMINOPHEN 5-325 MG PO TABS: 1.0000 | ORAL_TABLET | Freq: Four times a day (QID) | ORAL | 0 refills | Status: DC | PRN

## 2020-02-21 MED ORDER — ACETAMINOPHEN 325 MG PO TABS
ORAL_TABLET | ORAL | Status: AC
  Filled 2020-02-21: qty 2

## 2020-02-21 MED ORDER — DIPHENHYDRAMINE HCL 25 MG PO TABS
25.0000 mg | ORAL_TABLET | Freq: Once | ORAL | Status: AC
  Administered 2020-02-21: 25 mg via ORAL
  Filled 2020-02-21: qty 1

## 2020-02-21 NOTE — Telephone Encounter (Signed)
Contacted by Lattie Haw with [facility]. Need verbal order from Dr. Irene Limbo to accept 'least incompatible' for transfusion today. Dr. Irene Limbo informed, and gave verbal order to accept. Lisa informed.

## 2020-02-21 NOTE — Patient Instructions (Signed)

## 2020-02-22 ENCOUNTER — Ambulatory Visit
Admission: RE | Admit: 2020-02-22 | Discharge: 2020-02-22 | Disposition: A | Discharge status: Home / Self Care | Payer: Medicare Other | Source: Ambulatory Visit | Attending: Radiation Oncology | Admitting: Radiation Oncology

## 2020-02-22 ENCOUNTER — Other Ambulatory Visit: Payer: Self-pay

## 2020-02-22 ENCOUNTER — Encounter: Payer: Self-pay | Admitting: Radiation Oncology

## 2020-02-22 VITALS — Ht 60.0 in | Wt 191.0 lb

## 2020-02-22 DIAGNOSIS — C7952 Secondary malignant neoplasm of bone marrow: Secondary | ICD-10-CM | POA: Diagnosis not present

## 2020-02-22 DIAGNOSIS — C7951 Secondary malignant neoplasm of bone: Secondary | ICD-10-CM | POA: Diagnosis not present

## 2020-02-22 DIAGNOSIS — C9000 Multiple myeloma not having achieved remission: Secondary | ICD-10-CM

## 2020-02-22 DIAGNOSIS — Z853 Personal history of malignant neoplasm of breast: Secondary | ICD-10-CM | POA: Diagnosis not present

## 2020-02-22 DIAGNOSIS — G893 Neoplasm related pain (acute) (chronic): Secondary | ICD-10-CM | POA: Diagnosis not present

## 2020-02-22 LAB — TYPE AND SCREEN
ABO/RH(D): A POS
Antibody Screen: POSITIVE
DAT, IgG: POSITIVE
Unit division: 0

## 2020-02-22 LAB — BPAM RBC
Blood Product Expiration Date: 202108142359
ISSUE DATE / TIME: 202107261542
Unit Type and Rh: 6200

## 2020-02-22 NOTE — Progress Notes (Signed)
Radiation Oncology         [phone] ________________________________  Initial outpatient Consultation - Conducted via telephone due to current COVID-19 concerns for limiting patient exposure  Name: Tracy [name] MRN: [id number]  Date of Service: 02/22/2020 DOB: [birth year]  YO:KHTXHF, Aundra Millet, MD  [name], MD   REFERRING PHYSICIAN: [name], MD  DIAGNOSIS: 74 yo woman with multiple myeloma and symptomatic involvement of L5 and left hip    ICD-10-CM   1. Multiple myeloma not having achieved remission ([place])  C90.00   2. Secondary malignant neoplasm of bone and bone marrow (H[facility])  C79.51    C79.52     HISTORY OF PRESENT ILLNESS: Tracy [name] is a 74 y.o. female seen at the request of Dr. Irene Limbo. She has a history of triple negative left breast cancer in 2001 (while living in [place]) and ER+ right breast cancer in 2016 (under the care of Dr. [name]). She received adjuvant chemotherapy in 2001. She underwent lumpectomy and radiation therapy for both cancers.  She declined antiestrogen therapy so she has been on surveillance since that time and last seen in 2017.  She recently developed a cough with persistent acid reflux so her PCP ordered a CT A/P to evaluate.  This study was performed on 01/07/2020 showing no acute pulmonary process but development of lytic lesions within pelvis, with a 4.5 cm lesion in the posterior left acetabulum and a 2.9 centimeter eccentric right L5 lesion in the vertebral body, felt most likely to be metastatic disease but multiple myeloma could look similar.   She proceeded to bone marrow biopsy and a CT-guided core biopsy of the left ischial lesion on 01/17/2020. Pathology from both samples revealed plasma cell neoplasm, consistent with multiple myeloma.  A PET scan was performed on 01/20/2020 for disease staging and this confirmed hypermetabolism corresponding to the left acetabular and L5 lytic lesions without evidence of  hypermetabolic soft tissue primary neoplasm, metastatic disease or soft tissue myeloma.  She was referred to Dr. Irene Limbo and received her first dose of daratumumab/dexamethasone systemic therapy on 02/11/2020.  She has pain in the low back extending around to the right hip which is worse with sitting and weightbearing activity.  She denies any paresthesias or focal weakness in the lower extremities and denies any pain in the left hip or other focal areas.  She denies any issues with bowel or bladder function.  She has been referred today for consideration of palliative radiotherapy to the sites of painful lesions.  PREVIOUS RADIATION THERAPY: Yes  08/23/15 - 09/21/15 (Dr. Pablo Ledger): 1. Right breast/ 42.5Gy at 2.5 Gy per fraction x 17 fractions.  2. Right breast boost/ 7.5 Gy at 2.5 Gy per fraction x 3 fractions  2001: Left Breast (performed in [place])  PAST MEDICAL HISTORY:  Past Medical History:  Diagnosis Date  . Anxiety   . Arthritis   . Breast cancer ([place]) 06/13/15  . Cancer ([place]) 2000   breast cancer  . Chronic bronchitis ([place])   . Chronic bronchitis ([place])   . Hyperlipidemia   . Hypertension   . Myocardial infarction ([place]) 2001  . Personal history of radiation therapy   . Restless leg   . Stroke [facility]) 2004   TIA, no deficits      PAST SURGICAL HISTORY: Past Surgical History:  Procedure Laterality Date  . ABDOMINAL HYSTERECTOMY  1985  . BREAST LUMPECTOMY Left 2000   radiation and chemo  . BREAST LUMPECTOMY Right  2016   radiation  . BREAST SURGERY  2001   lt breast lumpectomy  . RADIOACTIVE SEED GUIDED PARTIAL MASTECTOMY WITH AXILLARY SENTINEL LYMPH NODE BIOPSY Right 07/07/2015   Procedure: RIGHT RADIOACTIVE SEED GUIDED PARTIAL MASTECTOMY WITH AXILLARY SENTINEL LYMPH NODE BIOPSY;  Surgeon: Autumn Messing III, MD;  Location: [facility];  Service: General;  Laterality: Right;  . SMALL INTESTINE SURGERY    . TUBAL LIGATION      FAMILY HISTORY:  Family History    Problem Relation Age of Onset  . Diabetes Father   . Lung cancer Sister        dx. <50; former smoker  . Diabetes Brother   . Diabetes Paternal Aunt   . Stroke Maternal Grandmother   . Diabetes Paternal Grandmother   . Emphysema Mother 62       smoker  . Diabetes Brother   . Brain cancer Brother 7       unknown tumor type  . Cancer Daughter 44       neck cancer  . Other Daughter        hysterectomy for unspecified reason  . Breast cancer Cousin   . Cancer Cousin        unspecified type  . Breast cancer Other        triple negative breast cancer in her 19s  . Cancer Daughter   . Colon polyps Neg Hx   . Esophageal cancer Neg Hx   . Gallbladder disease Neg Hx     SOCIAL HISTORY:  Social History   Socioeconomic History  . Marital status: Divorced    Spouse name: Not on file  . Number of children: 7  . Years of education: Not on file  . Highest education level: Not on file  Occupational History  . Occupation: retired  Tobacco Use  . Smoking status: Former Smoker    Packs/day: 1.00    Years: 20.00    Pack years: 20.00    Types: Cigarettes    Quit date: 07/30/2011    Years since quitting: 8.5  . Smokeless tobacco: Never Used  . Tobacco comment: Quit >4 years ago; 1 ppd for about 5/20 years (remaining was less)  Vaping Use  . Vaping Use: Former  Substance and Sexual Activity  . Alcohol use: No    Alcohol/week: 0.0 standard drinks  . Drug use: No  . Sexual activity: Not Currently  Other Topics Concern  . Not on file  Social History Narrative   Lives alone.  Retired.  Education:  11th grade GED.  Children:  7 (one here).    Social Determinants of Health   Financial Resource Strain:   . Difficulty of Paying Living Expenses:   Food Insecurity: No Food Insecurity  . Worried About Charity fundraiser in the Last Year: Never true  . Ran Out of Food in the Last Year: Never true  Transportation Needs: No Transportation Needs  . Lack of Transportation (Medical): No   . Lack of Transportation (Non-Medical): No  Physical Activity:   . Days of Exercise per Week:   . Minutes of Exercise per Session:   Stress:   . Feeling of Stress :   Social Connections:   . Frequency of Communication with Friends and Family:   . Frequency of Social Gatherings with Friends and Family:   . Attends Religious Services:   . Active Member of Clubs or Organizations:   . Attends Archivist Meetings:   .  Marital Status:   Intimate Partner Violence:   . Fear of Current or Ex-Partner:   . Emotionally Abused:   Marland Kitchen Physically Abused:   . Sexually Abused:     ALLERGIES: Bacitracin-neomycin-polymyxin  [neomycin-bacitracin zn-polymyx], Nsaids, Tape, Ambien [zolpidem tartrate], Amoxicillin, Contrast media [iodinated diagnostic agents], Latex, and Prednisone  MEDICATIONS:  Current Outpatient Medications  Medication Sig Dispense Refill  . Accu-Chek Softclix Lancets lancets Use as instructed to check sugars 1-2 times daily. 100 each 12  . acyclovir (ZOVIRAX) 400 MG tablet Take 1 tablet (400 mg total) by mouth 2 (two) times daily. 60 tablet 11  . aspirin 81 MG tablet Take 81 mg by mouth daily.    Marland Kitchen atorvastatin (LIPITOR) 10 MG tablet TAKE 1 TABLET BY MOUTH  DAILY 90 tablet 3  . dexamethasone (DECADRON) 4 MG tablet Take 3 tabs with breakfast the day after each treatment. 20 tablet 4  . ferrous sulfate 325 (65 FE) MG tablet Take 1 tablet (325 mg total) by mouth daily with breakfast. 30 tablet 6  . gabapentin (NEURONTIN) 300 MG capsule Take 1 capsule (300 mg total) by mouth 3 (three) times daily. 270 capsule 1  . glucose blood (ACCU-CHEK GUIDE) test strip Use as instructed to check sugars 1-2 times daily. 100 each 12  . HYDROcodone-acetaminophen (NORCO) 5-325 MG tablet Take 1 tablet by mouth every 6 (six) hours as needed for moderate pain or severe pain. 60 tablet 0  . loratadine (CLARITIN) 10 MG tablet Take 10 mg by mouth daily.    Marland Kitchen LORazepam (ATIVAN) 0.5 MG tablet Take 1  tablet (0.5 mg total) by mouth every 6 (six) hours as needed (Nausea or vomiting). 30 tablet 0  . losartan (COZAAR) 100 MG tablet TAKE 1 TABLET BY MOUTH  DAILY 90 tablet 3  . MELATONIN PO Take by mouth.     . ondansetron (ZOFRAN) 8 MG tablet Take 1 tablet (8 mg total) by mouth 2 (two) times daily as needed (Nausea or vomiting). 30 tablet 1  . pantoprazole (PROTONIX) 40 MG tablet Take 1 tablet (40 mg total) by mouth 2 (two) times daily. 60 tablet 3  . prochlorperazine (COMPAZINE) 10 MG tablet Take 1 tablet (10 mg total) by mouth every 6 (six) hours as needed (Nausea or vomiting). 30 tablet 1  . sertraline (ZOLOFT) 50 MG tablet TAKE 1 TABLET(50 MG) BY MOUTH DAILY (Patient taking differently: Take 25 mg by mouth daily. TAKE 1 TABLET(50 MG) BY MOUTH DAILY) 90 tablet 0  . tiZANidine (ZANAFLEX) 4 MG tablet Take 1 tablet (4 mg total) by mouth at bedtime. 30 tablet 3  . traZODone (DESYREL) 100 MG tablet TAKE 1 TABLET BY MOUTH AT  BEDTIME 90 tablet 3   No current facility-administered medications for this encounter.    REVIEW OF SYSTEMS:  On review of systems, the patient reports that she is doing well overall. She denies any chest pain, shortness of breath, cough, fevers, chills, night sweats, unintended weight changes. She denies any bowel or bladder disturbances, and denies abdominal pain, nausea or vomiting. She reports very mild right-sided cervical spine pain, occasional left arm and hand pain, and right low back/hip pain, which she rates 7/10.  The pain starts in her low back and wraps around the lateral right hip and to the front groin and thigh area.  She reports that the pain occasionally radiates down the leg with activity.  She notes the hip pain is worse with sitting or weightbearing activities.  She denies paresthesias, focal  weakness or bowel or bladder dysfunction.  She denies any other focal areas of musculoskeletal pain.  A complete review of systems is obtained and is otherwise negative.     PHYSICAL EXAM:  Wt Readings from Last 3 Encounters:  02/22/20 191 lb (86.6 kg)  02/18/20 191 lb 4.8 oz (86.8 kg)  02/13/20 194 lb (88 kg)   Temp Readings from Last 3 Encounters:  02/21/20 99.1 F (37.3 C) (Oral)  02/18/20 98.3 F (36.8 C) (Oral)  02/18/20 98.1 F (36.7 C) (Tympanic)   BP Readings from Last 3 Encounters:  02/21/20 (!) 188/68  02/18/20 (!) 166/61  02/18/20 (!) 166/68   Pulse Readings from Last 3 Encounters:  02/21/20 68  02/18/20 78  02/18/20 80    /10  Physical exam not performed in light of telephone consult visit format.   KPS = 90  100 - Normal; no complaints; no evidence of disease. 90   - Able to carry on normal activity; minor signs or symptoms of disease. 80   - Normal activity with effort; some signs or symptoms of disease. 60   - Cares for self; unable to carry on normal activity or to do active work. 60   - Requires occasional assistance, but is able to care for most of his personal needs. 50   - Requires considerable assistance and frequent medical care. 53   - Disabled; requires special care and assistance. 24   - Severely disabled; hospital admission is indicated although death not imminent. 1   - Very sick; hospital admission necessary; active supportive treatment necessary. 10   - Moribund; fatal processes progressing rapidly. 0     - Dead  Karnofsky DA, Abelmann [place], Craver LS and Burchenal [facility] [phone]) The use of the nitrogen mustards in the palliative treatment of carcinoma: with particular reference to bronchogenic carcinoma Cancer 1 634-56  LABORATORY DATA:  Lab Results  Component Value Date   WBC 6.1 02/18/2020   HGB 7.1 (L) 02/18/2020   HCT 22.1 (L) 02/18/2020   MCV 94.4 02/18/2020   PLT 273 02/18/2020   Lab Results  Component Value Date   NA 139 02/18/2020   K 3.6 02/18/2020   CL 108 02/18/2020   CO2 22 02/18/2020   Lab Results  Component Value Date   ALT 10 02/18/2020   AST 14 (L) 02/18/2020   ALKPHOS 85  02/18/2020   BILITOT 0.3 02/18/2020     RADIOGRAPHY: DG Chest Port 1 View  Result Date: 02/13/2020 CLINICAL DATA:  Pt is c/o productive cough onset yesterday. Denies fever. Denies chest pain but is having pain to throat. H/o Breast Cancer, Chronic Bronchitis, Myocardial Infarction, HTN, Stroke. EXAM: PORTABLE CHEST 1 VIEW COMPARISON:  Chest radiograph 10/12/2018 FINDINGS: Stable cardiomediastinal contours aortic atherosclerotic calcification. Lungs are hyperinflated. Chronic diffuse bilateral coarse interstitial opacities. No new focal consolidation. No pneumothorax or large pleural effusion. No acute finding in the visualized skeleton. IMPRESSION: Chronic bronchitic changes.  No new consolidation. Electronically Signed   By: Audie Pinto M.D.   On: 02/13/2020 14:02      IMPRESSION/PLAN:  This visit was conducted via telephone to spare the patient unnecessary potential exposure in the healthcare setting during the current COVID-19 pandemic. 1. 74 y.o. female with a painful lytic lesion at L5 secondary to newly diagnosed multiple myeloma. Today, we talked to the patient about the findings and workup thus far. We discussed the natural history of multiple myeloma and general treatment, highlighting the role of radiotherapy in  the management. We discussed the available radiation techniques, and focused on the details and logistics of delivery.  The recommendation is to proceed with a 2-week course of daily palliative radiotherapy targeting the painful lesion in the L5 vertebral body as well as the left hip/acetabular lesion in an effort to prevent future fracture.  We reviewed the anticipated acute and late sequelae associated with radiation in this setting. The patient was encouraged to ask questions that were answered to her stated satisfaction.  At the end of our conversation, the patient would like to proceed with a 2-week course of palliative radiation therapy to the painful lesion in the L5  vertebral body as well as the left hip/acetabular lesion.  She appears to have a good understanding of her disease and our treatment recommendations which are of palliative intent.  We will share our discussion with Dr. Irene Limbo and move forward with treatment planning in anticipation of beginning her daily treatments in the near future. She has provided verbal consent to proceed and is scheduled for CT simulation on Thursday, 02/24/2020, at 11 am, at which time she will sign formal written consent and a copy of this document will be placed in her medical record.  We enjoyed meeting her today and look forward to continuing to participate in her care.   Given current concerns for patient exposure during the COVID-19 pandemic, this encounter was conducted via telephone. The patient was notified in advance and was offered a [place] meeting to allow for face to face communication but unfortunately reported that he did not have the appropriate resources/technology to support such a visit and instead preferred to proceed with telephone consult. The patient has given verbal consent for this type of encounter. The time spent during this encounter was 45 minutes. The attendants for this meeting include Tyler Pita MD, Ashlyn Bruning PA-C, [place], patient, [place]. During the encounter, Tyler Pita MD, Ashlyn Bruning PA-C, and scribe, Wilburn Mylar were located at [place].  Patient, Norie Latendresse Oleski was located at home.    Nicholos Johns, PA-C    Tyler Pita, MD  [facility] Direct Dial: [phone]  Fax: [phone] [email or web address]  Skype  [email or web address]   This document serves as a record of services personally performed by Tyler Pita, MD and Freeman Caldron, PA-C. It was created on their behalf by Wilburn Mylar, a trained medical scribe. The creation of this record is based on the scribe's  personal observations and the provider's statements to them. This document has been checked and approved by the attending provider.

## 2020-02-22 NOTE — Progress Notes (Signed)
Histology and Location of Primary Cancer: Multiple myeloma not having achieve remission  Sites of Visceral and Bony Metastatic Disease: bony  Location(s) of Symptomatic Metastases: back and hip pain  Past/Anticipated chemotherapy by medical oncology, if any: 3 weekly doses of C1 of Daratumumab with labs. Endorses taking decadron only before treatments  Pain on a scale of 0-10 is: Reports cervical spine pain worse on right than left. Reports left arm and left hand pain. Reports hip pain resolved with chemo. Reports pain 7 on a scale of 0-10. Reports taking oxycodone-acetaminophen 1-2 tablets every 4 hours, gabapentin tid, and zanaflex at bedtime.    If Spine Met(s), symptoms, if any, include:  Bowel/Bladder retention or incontinence (please describe): Reports increased urinary urgency. Denies bowel or bladder retention or incontinence.  Numbness or weakness in extremities (please describe): denies  Current Decadron regimen, if applicable: Only prior to chemotherapy  Ambulatory status? Walker? Wheelchair?: Ambulatory with Assistance  Of cane.  SAFETY ISSUES:  Prior radiation? yes, 2016 for breast ca  Pacemaker/ICD? no  Possible current pregnancy? no  Is the patient on methotrexate? no  Current Complaints / other details:  74 year old female. Divorce. Seven childen. Lives alone. Retired. Sister with hx of lung ac, brother with hx of brain ca, daughter with history of neck ca, and cousin with breast.

## 2020-02-23 NOTE — Progress Notes (Signed)
Radiation Oncology         ([phone] ________________________________  Name: EYMI LIPUMA MRN: [id number]  Date: 02/24/2020  DOB: 08-15-1945  SIMULATION AND TREATMENT PLANNING NOTE    ICD-10-CM   1. Multiple myeloma not having achieved remission ([place])  C90.00     DIAGNOSIS:  74 yo woman with multiple myeloma and symptomatic involvement of L5 and left hip  NARRATIVE:  The patient was brought to the [facility].  Identity was confirmed.  All relevant records and images related to the planned course of therapy were reviewed.  The patient freely provided informed written consent to proceed with treatment after reviewing the details related to the planned course of therapy. The consent form was witnessed and verified by the simulation staff.  Then, the patient was set-up in a stable reproducible  supine position for radiation therapy.  CT images were obtained.  Surface markings were placed.  The CT images were loaded into the planning software.  Then the target and avoidance structures were contoured.  Treatment planning then occurred.  The radiation prescription was entered and confirmed.  Then, I designed and supervised the construction of a total of 3 medically necessary complex treatment devices consisting of leg positioner and MLC apertures to cover the treated hip and L5 area shielding critical organs of bladder, bowel and femoral neck maximally.  I have requested : 3D Simulation  I have requested a DVH of the following structures: Rectum, Bladder, femoral heads and target.  PLAN:  The patient will receive 20 Gy in 10 fractions to the left hip and L5 using one plan.  ________________________________  Sheral Apley. Tammi Klippel, M.D.

## 2020-02-24 ENCOUNTER — Other Ambulatory Visit: Payer: Self-pay

## 2020-02-24 ENCOUNTER — Other Ambulatory Visit: Payer: Self-pay | Admitting: *Deleted

## 2020-02-24 ENCOUNTER — Ambulatory Visit
Admission: RE | Admit: 2020-02-24 | Discharge: 2020-02-24 | Disposition: A | Discharge status: Home / Self Care | Payer: Medicare Other | Source: Ambulatory Visit | Attending: Radiation Oncology | Admitting: Radiation Oncology

## 2020-02-24 DIAGNOSIS — C9000 Multiple myeloma not having achieved remission: Secondary | ICD-10-CM | POA: Insufficient documentation

## 2020-02-24 DIAGNOSIS — C7951 Secondary malignant neoplasm of bone: Secondary | ICD-10-CM | POA: Insufficient documentation

## 2020-02-24 DIAGNOSIS — D649 Anemia, unspecified: Secondary | ICD-10-CM

## 2020-02-24 DIAGNOSIS — C50411 Malignant neoplasm of upper-outer quadrant of right female breast: Secondary | ICD-10-CM | POA: Diagnosis not present

## 2020-02-24 DIAGNOSIS — C7952 Secondary malignant neoplasm of bone marrow: Secondary | ICD-10-CM | POA: Diagnosis not present

## 2020-02-25 ENCOUNTER — Inpatient Hospital Stay: Payer: Medicare Other

## 2020-02-25 ENCOUNTER — Other Ambulatory Visit: Payer: Self-pay | Admitting: Family Medicine

## 2020-02-25 ENCOUNTER — Other Ambulatory Visit: Payer: Self-pay

## 2020-02-25 VITALS — BP 198/73 | HR 77 | Temp 99.0°F | Resp 18 | Ht 60.0 in | Wt 186.2 lb

## 2020-02-25 DIAGNOSIS — C9000 Multiple myeloma not having achieved remission: Secondary | ICD-10-CM

## 2020-02-25 DIAGNOSIS — D509 Iron deficiency anemia, unspecified: Secondary | ICD-10-CM

## 2020-02-25 DIAGNOSIS — Z5112 Encounter for antineoplastic immunotherapy: Secondary | ICD-10-CM | POA: Diagnosis not present

## 2020-02-25 DIAGNOSIS — C7951 Secondary malignant neoplasm of bone: Secondary | ICD-10-CM | POA: Diagnosis not present

## 2020-02-25 DIAGNOSIS — Z7189 Other specified counseling: Secondary | ICD-10-CM

## 2020-02-25 DIAGNOSIS — Z9221 Personal history of antineoplastic chemotherapy: Secondary | ICD-10-CM | POA: Diagnosis not present

## 2020-02-25 DIAGNOSIS — Z853 Personal history of malignant neoplasm of breast: Secondary | ICD-10-CM | POA: Diagnosis not present

## 2020-02-25 DIAGNOSIS — D649 Anemia, unspecified: Secondary | ICD-10-CM

## 2020-02-25 DIAGNOSIS — C50411 Malignant neoplasm of upper-outer quadrant of right female breast: Secondary | ICD-10-CM | POA: Diagnosis not present

## 2020-02-25 DIAGNOSIS — Z923 Personal history of irradiation: Secondary | ICD-10-CM | POA: Diagnosis not present

## 2020-02-25 DIAGNOSIS — C7952 Secondary malignant neoplasm of bone marrow: Secondary | ICD-10-CM | POA: Diagnosis not present

## 2020-02-25 LAB — CBC WITH DIFFERENTIAL (CANCER CENTER ONLY)
Abs Immature Granulocytes: 0.01 10*3/uL (ref 0.00–0.07)
Basophils Absolute: 0 10*3/uL (ref 0.0–0.1)
Basophils Relative: 0 %
Eosinophils Absolute: 0 10*3/uL (ref 0.0–0.5)
Eosinophils Relative: 1 %
HCT: 30 % — ABNORMAL LOW (ref 36.0–46.0)
Hemoglobin: 9.8 g/dL — ABNORMAL LOW (ref 12.0–15.0)
Immature Granulocytes: 0 %
Lymphocytes Relative: 37 %
Lymphs Abs: 1.9 10*3/uL (ref 0.7–4.0)
MCH: 31.1 pg (ref 26.0–34.0)
MCHC: 32.7 g/dL (ref 30.0–36.0)
MCV: 95.2 fL (ref 80.0–100.0)
Monocytes Absolute: 0.4 10*3/uL (ref 0.1–1.0)
Monocytes Relative: 7 %
Neutro Abs: 2.8 10*3/uL (ref 1.7–7.7)
Neutrophils Relative %: 55 %
Platelet Count: 332 10*3/uL (ref 150–400)
RBC: 3.15 MIL/uL — ABNORMAL LOW (ref 3.87–5.11)
RDW: 14.6 % (ref 11.5–15.5)
WBC Count: 5.1 10*3/uL (ref 4.0–10.5)
nRBC: 0 % (ref 0.0–0.2)

## 2020-02-25 LAB — CMP (CANCER CENTER ONLY)
ALT: 13 U/L (ref 0–44)
AST: 12 U/L — ABNORMAL LOW (ref 15–41)
Albumin: 2.9 g/dL — ABNORMAL LOW (ref 3.5–5.0)
Alkaline Phosphatase: 96 U/L (ref 38–126)
Anion gap: 7 (ref 5–15)
BUN: 9 mg/dL (ref 8–23)
CO2: 21 mmol/L — ABNORMAL LOW (ref 22–32)
Calcium: 8.7 mg/dL — ABNORMAL LOW (ref 8.9–10.3)
Chloride: 108 mmol/L (ref 98–111)
Creatinine: 1.14 mg/dL — ABNORMAL HIGH (ref 0.44–1.00)
GFR, Est AFR Am: 54 mL/min — ABNORMAL LOW (ref >60)
GFR, Estimated: 47 mL/min — ABNORMAL LOW (ref >60)
Glucose, Bld: 117 mg/dL — ABNORMAL HIGH (ref 70–99)
Potassium: 3.5 mmol/L (ref 3.5–5.1)
Sodium: 136 mmol/L (ref 135–145)
Total Bilirubin: 0.3 mg/dL (ref 0.3–1.2)
Total Protein: 10 g/dL — ABNORMAL HIGH (ref 6.5–8.1)

## 2020-02-25 LAB — SAMPLE TO BLOOD BANK

## 2020-02-25 MED ORDER — SODIUM CHLORIDE 0.9 % IV SOLN
Freq: Once | INTRAVENOUS | Status: AC
  Filled 2020-02-25: qty 250

## 2020-02-25 MED ORDER — ACETAMINOPHEN 325 MG PO TABS
650.0000 mg | ORAL_TABLET | Freq: Once | ORAL | Status: AC
  Administered 2020-02-25: 650 mg via ORAL

## 2020-02-25 MED ORDER — ACETAMINOPHEN 325 MG PO TABS
ORAL_TABLET | ORAL | Status: AC
  Filled 2020-02-25: qty 2

## 2020-02-25 MED ORDER — DIPHENHYDRAMINE HCL 25 MG PO CAPS
50.0000 mg | ORAL_CAPSULE | Freq: Once | ORAL | Status: AC
  Administered 2020-02-25: 50 mg via ORAL

## 2020-02-25 MED ORDER — SODIUM CHLORIDE 0.9 % IV SOLN
16.0000 mg/kg | Freq: Once | INTRAVENOUS | Status: AC
  Administered 2020-02-25: 1400 mg via INTRAVENOUS
  Filled 2020-02-25: qty 60

## 2020-02-25 MED ORDER — SODIUM CHLORIDE 0.9 % IV SOLN
20.0000 mg | Freq: Once | INTRAVENOUS | Status: AC
  Administered 2020-02-25: 20 mg via INTRAVENOUS
  Filled 2020-02-25: qty 20

## 2020-02-25 MED ORDER — MONTELUKAST SODIUM 10 MG PO TABS
ORAL_TABLET | ORAL | Status: AC
  Filled 2020-02-25: qty 1

## 2020-02-25 MED ORDER — MONTELUKAST SODIUM 10 MG PO TABS
10.0000 mg | ORAL_TABLET | Freq: Once | ORAL | Status: AC
  Administered 2020-02-25: 10 mg via ORAL

## 2020-02-25 MED ORDER — FAMOTIDINE IN NACL 20-0.9 MG/50ML-% IV SOLN
20.0000 mg | Freq: Once | INTRAVENOUS | Status: AC
  Administered 2020-02-25: 20 mg via INTRAVENOUS

## 2020-02-25 MED ORDER — DIPHENHYDRAMINE HCL 25 MG PO CAPS
ORAL_CAPSULE | ORAL | Status: AC
  Filled 2020-02-25: qty 2

## 2020-02-25 MED ORDER — SODIUM CHLORIDE 0.9 % IV SOLN
750.0000 mg | Freq: Once | INTRAVENOUS | Status: AC
  Administered 2020-02-25: 750 mg via INTRAVENOUS
  Filled 2020-02-25: qty 15

## 2020-02-25 MED ORDER — SODIUM CHLORIDE 0.9 % IV SOLN: 16.0000 mg/kg | Freq: Once | INTRAVENOUS | Status: DC

## 2020-02-25 MED ORDER — FAMOTIDINE IN NACL 20-0.9 MG/50ML-% IV SOLN
INTRAVENOUS | Status: AC
  Filled 2020-02-25: qty 50

## 2020-02-25 NOTE — Progress Notes (Signed)
Elevated Blood Pressure  Patient's BP post treatment was elevated, observed her for 40 minutes and rechecked BP which remained elevated, she confirmed taking her BP medication today. She also complained of dyspnea. Dr. Lorenso Courier at chairside to see patient. She declined calling a family member to drive her home, insisting she would be fine. Patient encouraged to take BP medication when she gets home and follow up with PCP for better BP control and she verbalized understanding. She also knows to go to the ER if she feels any worse.

## 2020-02-25 NOTE — Progress Notes (Signed)
Accelerated Daratumumab Infusion Pharmacist Review   Tracy [name] is a 74 y.o. female being treated with daratumumab for myeloma. This patient may be considered for the accelerated infusion protocol.    _0  Patient has received at least 2 prior doses of daratumumab at standard infusion rates.  Infusion Date 1 02/11/20  Infusion Date 2  02/18/20   _1  No documented infusion related reactions with prior infusions.   _2  Physician approval of accelerated 90 minute infusion daratumumab. Approval obtained by Dr. Lorenso Courier in the absence of Dr. Irene Limbo.  _3  Orders updated in current Treatment Plan to reflect accelerated 90 minute infusion protocol with further doses.    Kennith Center, Pharm.D., [name] 02/25/2020_4 :34 PM     Resource: Angela Burke., et al. Ninety-minute daratumumab infusion is safe in multiple myeloma. Leukemia. 2018;32:2495-2518. doi: 13.2440/N02725-366-4403-4

## 2020-02-25 NOTE — Patient Instructions (Signed)
[facility] Discharge Instructions for Patients Receiving Chemotherapy  Today you received the following chemotherapy agents Daratumumab (DARZALEX).  To help prevent nausea and vomiting after your treatment, we encourage you to take your nausea medication as prescribed.   If you develop nausea and vomiting that is not controlled by your nausea medication, call the clinic.   BELOW ARE SYMPTOMS THAT SHOULD BE REPORTED IMMEDIATELY:  *FEVER GREATER THAN 100.5 F  *CHILLS WITH OR WITHOUT FEVER  NAUSEA AND VOMITING THAT IS NOT CONTROLLED WITH YOUR NAUSEA MEDICATION  *UNUSUAL SHORTNESS OF BREATH  *UNUSUAL BRUISING OR BLEEDING  TENDERNESS IN MOUTH AND THROAT WITH OR WITHOUT PRESENCE OF ULCERS  *URINARY PROBLEMS  *BOWEL PROBLEMS  UNUSUAL RASH Items with * indicate a potential emergency and should be followed up as soon as possible.  Feel free to call the clinic should you have any questions or concerns. The clinic phone number is [phone].  Please show the CHEMO ALERT CARD at check-in to the Emergency Department and triage nurse.  Ferric carboxymaltose injection What is this medicine? FERRIC CARBOXYMALTOSE (ferr-ik car-box-ee-mol-toes) is an iron complex. Iron is used to make healthy red blood cells, which carry oxygen and nutrients throughout the body. This medicine is used to treat anemia in people with chronic kidney disease or people who cannot take iron by mouth. This medicine may be used for other purposes; ask your health care provider or pharmacist if you have questions. COMMON BRAND NAME(S): Injectafer What should I tell my health care provider before I take this medicine? They need to know if you have any of these conditions:  high levels of iron in the blood  liver disease  an unusual or allergic reaction to iron, other medicines, foods, dyes, or preservatives  pregnant or trying to get pregnant  breast-feeding How should I use this  medicine? This medicine is for infusion into a vein. It is given by a health care professional in a hospital or clinic setting. Talk to your pediatrician regarding the use of this medicine in children. Special care may be needed. Overdosage: If you think you have taken too much of this medicine contact a poison control center or emergency room at once. NOTE: This medicine is only for you. Do not share this medicine with others. What if I miss a dose? It is important not to miss your dose. Call your doctor or health care professional if you are unable to keep an appointment. What may interact with this medicine? Do not take this medicine with any of the following medications:  deferoxamine  dimercaprol  other iron products This list may not describe all possible interactions. Give your health care provider a list of all the medicines, herbs, non-prescription drugs, or dietary supplements you use. Also tell them if you smoke, drink alcohol, or use illegal drugs. Some items may interact with your medicine. What should I watch for while using this medicine? Visit your doctor or health care professional regularly. Tell your doctor if your symptoms do not start to get better or if they get worse. You may need blood work done while you are taking this medicine. You may need to follow a special diet. Talk to your doctor. Foods that contain iron include: whole grains/cereals, dried fruits, beans, or peas, leafy green vegetables, and organ meats (liver, kidney). What side effects may I notice from receiving this medicine? Side effects that you should report to your doctor or health care professional as soon as possible:    allergic reactions like skin rash, itching or hives, swelling of the face, lips, or tongue  dizziness  facial flushing Side effects that usually do not require medical attention (report to your doctor or health care professional if they continue or are bothersome):  changes in  taste  constipation  headache  nausea, vomiting  pain, redness, or irritation at site where injected This list may not describe all possible side effects. Call your doctor for medical advice about side effects. You may report side effects to FDA at 1-800-FDA-1088. Where should I keep my medicine? This drug is given in a hospital or clinic and will not be stored at home. NOTE: This sheet is a summary. It may not cover all possible information. If you have questions about this medicine, talk to your doctor, pharmacist, or health care provider.  2020 Elsevier/Gold Standard (2016-08-29 09:40:29)  

## 2020-02-29 ENCOUNTER — Ambulatory Visit
Admission: RE | Admit: 2020-02-29 | Discharge: 2020-02-29 | Disposition: A | Discharge status: Home / Self Care | Payer: Medicare Other | Source: Ambulatory Visit | Attending: Radiation Oncology | Admitting: Radiation Oncology

## 2020-02-29 DIAGNOSIS — C7952 Secondary malignant neoplasm of bone marrow: Secondary | ICD-10-CM | POA: Diagnosis not present

## 2020-02-29 DIAGNOSIS — C7951 Secondary malignant neoplasm of bone: Secondary | ICD-10-CM | POA: Diagnosis not present

## 2020-02-29 DIAGNOSIS — C9000 Multiple myeloma not having achieved remission: Secondary | ICD-10-CM | POA: Insufficient documentation

## 2020-03-01 ENCOUNTER — Ambulatory Visit
Admission: RE | Admit: 2020-03-01 | Discharge: 2020-03-01 | Disposition: A | Discharge status: Home / Self Care | Payer: Medicare Other | Source: Ambulatory Visit | Attending: Radiation Oncology | Admitting: Radiation Oncology

## 2020-03-01 ENCOUNTER — Other Ambulatory Visit: Payer: Self-pay

## 2020-03-01 DIAGNOSIS — C9000 Multiple myeloma not having achieved remission: Secondary | ICD-10-CM | POA: Diagnosis not present

## 2020-03-01 DIAGNOSIS — C7952 Secondary malignant neoplasm of bone marrow: Secondary | ICD-10-CM | POA: Diagnosis not present

## 2020-03-01 DIAGNOSIS — C7951 Secondary malignant neoplasm of bone: Secondary | ICD-10-CM | POA: Diagnosis not present

## 2020-03-01 NOTE — Progress Notes (Signed)
  HEMATOLOGY/ONCOLOGY CLINIC NOTE  Date of Service: 03/03/2020  Patient Care Team: [name], Katherine E, MD as PCP - General (Family Medicine) Wang, Hao, MD as Attending Physician (Physical Medicine and Rehabilitation) Dalldorf, Peter, MD as Consulting Physician (Orthopedic Surgery) Bates, Dwight, MD as Consulting Physician (Otolaryngology) Medoff, Jeffrey, MD as Consulting Physician (Gastroenterology) Toth, Paul III, MD as Consulting Physician (General Surgery) Gudena, Vinay, MD as Consulting Physician (Hematology and Oncology) Wentworth, Stacy, MD as Consulting Physician (Radiation Oncology) Mackey, Heather Thompson, NP as Nurse Practitioner (Hematology and Oncology) Potts, Jacob, RPH as Pharmacist (Pharmacist)  CHIEF COMPLAINTS/PURPOSE OF CONSULTATION:  Multiple Myeloma not having achieved remission  HISTORY OF PRESENTING ILLNESS:   Tracy [name] is a wonderful 73 y.o. female who has been referred to us by Dr Gudena for evaluation and management of Multiple Myeloma. The patient's last visit with us was on 02/18/20. The pt reports that she is doing well overall.  The pt reports she is doing well. Her energy levels have still been low. She has not had any more problems with treatment. Her pain medications are not working and causing drowsiness. The pain is in her low back, hip and down the right leg. It comes and goes and mostly comes from sitting. Her last day of radiation is the 16th. Pt dentist has given her dental clearance for bone strengthening medicine. Her dentist is [facility]. Pt did have a root canal. It still hurts the pt to lay on her right side.   Lab results today (03/03/20) of CBC w/diff and CMP is as follows: all values are WNL except for RBC at 3.40, Hemoglobin at 10.4, HCT at 32.7, CO2 at 21, Glucose at 157, Creatinine at 1.22, Calcium at 8.6, Total Protein at 9.7, Albumin at 2.9, AST at 10, Total Bilirubin at 0.2, GFR, Est Non Af Am at 43, GFR, Est AFR Am at  50  On review of systems, pt reports tightness in low back, hip, and down right leg and denies any other symptoms.   MEDICAL HISTORY:  Past Medical History:  Diagnosis Date  . Anxiety   . Arthritis   . Breast cancer (HCC) 06/13/15  . Cancer (HCC) 2000   breast cancer  . Chronic bronchitis (HCC)   . Chronic bronchitis (HCC)   . Hyperlipidemia   . Hypertension   . Myocardial infarction (HCC) 2001  . Personal history of radiation therapy   . Restless leg   . Stroke (HCC) 2004   TIA, no deficits    SURGICAL HISTORY: Past Surgical History:  Procedure Laterality Date  . ABDOMINAL HYSTERECTOMY  1985  . BREAST LUMPECTOMY Left 2000   radiation and chemo  . BREAST LUMPECTOMY Right 2016   radiation  . BREAST SURGERY  2001   lt breast lumpectomy  . RADIOACTIVE SEED GUIDED PARTIAL MASTECTOMY WITH AXILLARY SENTINEL LYMPH NODE BIOPSY Right 07/07/2015   Procedure: RIGHT RADIOACTIVE SEED GUIDED PARTIAL MASTECTOMY WITH AXILLARY SENTINEL LYMPH NODE BIOPSY;  Surgeon: Paul Toth III, MD;  Location: [facility];  Service: General;  Laterality: Right;  . SMALL INTESTINE SURGERY    . TUBAL LIGATION      SOCIAL HISTORY: Social History   Socioeconomic History  . Marital status: Divorced    Spouse name: Not on file  . Number of children: 7  . Years of education: Not on file  . Highest education level: Not on file  Occupational History  . Occupation: retired  Tobacco Use  . Smoking status: Former   Smoker    Packs/day: 1.00    Years: 20.00    Pack years: 20.00    Types: Cigarettes    Quit date: 07/30/2011    Years since quitting: 8.6  . Smokeless tobacco: Never Used  . Tobacco comment: Quit >4 years ago; 1 ppd for about 5/20 years (remaining was less)  Vaping Use  . Vaping Use: Former  Substance and Sexual Activity  . Alcohol use: No    Alcohol/week: 0.0 standard drinks  . Drug use: No  . Sexual activity: Not Currently  Other Topics Concern  . Not on file  Social  History Narrative   Lives alone.  Retired.  Education:  11th grade GED.  Children:  7 (one here).    Social Determinants of Health   Financial Resource Strain:   . Difficulty of Paying Living Expenses:   Food Insecurity: No Food Insecurity  . Worried About Charity fundraiser in the Last Year: Never true  . Ran Out of Food in the Last Year: Never true  Transportation Needs: No Transportation Needs  . Lack of Transportation (Medical): No  . Lack of Transportation (Non-Medical): No  Physical Activity:   . Days of Exercise per Week:   . Minutes of Exercise per Session:   Stress:   . Feeling of Stress :   Social Connections:   . Frequency of Communication with Friends and Family:   . Frequency of Social Gatherings with Friends and Family:   . Attends Religious Services:   . Active Member of Clubs or Organizations:   . Attends Archivist Meetings:   Marland Kitchen Marital Status:   Intimate Partner Violence:   . Fear of Current or Ex-Partner:   . Emotionally Abused:   Marland Kitchen Physically Abused:   . Sexually Abused:     FAMILY HISTORY: Family History  Problem Relation Age of Onset  . Diabetes Father   . Lung cancer Sister        dx. <50; former smoker  . Diabetes Brother   . Diabetes Paternal Aunt   . Stroke Maternal Grandmother   . Diabetes Paternal Grandmother   . Emphysema Mother 40       smoker  . Diabetes Brother   . Brain cancer Brother 24       unknown tumor type  . Cancer Daughter 73       neck cancer  . Other Daughter        hysterectomy for unspecified reason  . Breast cancer Cousin   . Cancer Cousin        unspecified type  . Breast cancer Other        triple negative breast cancer in her 32s  . Cancer Daughter   . Colon polyps Neg Hx   . Esophageal cancer Neg Hx   . Gallbladder disease Neg Hx     ALLERGIES:  is allergic to bacitracin-neomycin-polymyxin  [neomycin-bacitracin zn-polymyx], nsaids, tape, ambien [zolpidem tartrate], amoxicillin, contrast media  [iodinated diagnostic agents], latex, and prednisone.  MEDICATIONS:  Current Outpatient Medications  Medication Sig Dispense Refill  . Accu-Chek Softclix Lancets lancets Use as instructed to check sugars 1-2 times daily. 100 each 12  . acyclovir (ZOVIRAX) 400 MG tablet Take 1 tablet (400 mg total) by mouth 2 (two) times daily. 60 tablet 11  . aspirin 81 MG tablet Take 81 mg by mouth daily.    Marland Kitchen atorvastatin (LIPITOR) 10 MG tablet TAKE 1 TABLET BY MOUTH  DAILY 90 tablet 3  .  dexamethasone (DECADRON) 4 MG tablet Take 3 tabs with breakfast the day after each treatment. 20 tablet 4  . ferrous sulfate 325 (65 FE) MG tablet Take 1 tablet (325 mg total) by mouth daily with breakfast. 30 tablet 6  . gabapentin (NEURONTIN) 300 MG capsule TAKE 1 CAPSULE BY MOUTH 3  TIMES DAILY 270 capsule 3  . glucose blood (ACCU-CHEK GUIDE) test strip Use as instructed to check sugars 1-2 times daily. 100 each 12  . HYDROcodone-acetaminophen (NORCO) 5-325 MG tablet Take 1 tablet by mouth every 6 (six) hours as needed for moderate pain or severe pain. 60 tablet 0  . loratadine (CLARITIN) 10 MG tablet Take 10 mg by mouth daily.    Marland Kitchen LORazepam (ATIVAN) 0.5 MG tablet Take 1 tablet (0.5 mg total) by mouth every 6 (six) hours as needed (Nausea or vomiting). 30 tablet 0  . losartan (COZAAR) 100 MG tablet TAKE 1 TABLET BY MOUTH  DAILY 90 tablet 3  . MELATONIN PO Take by mouth.     . ondansetron (ZOFRAN) 8 MG tablet Take 1 tablet (8 mg total) by mouth 2 (two) times daily as needed (Nausea or vomiting). 30 tablet 1  . pantoprazole (PROTONIX) 40 MG tablet Take 1 tablet (40 mg total) by mouth 2 (two) times daily. 60 tablet 3  . prochlorperazine (COMPAZINE) 10 MG tablet Take 1 tablet (10 mg total) by mouth every 6 (six) hours as needed (Nausea or vomiting). 30 tablet 1  . sertraline (ZOLOFT) 50 MG tablet TAKE 1 TABLET(50 MG) BY MOUTH DAILY (Patient taking differently: Take 25 mg by mouth daily. TAKE 1 TABLET(50 MG) BY MOUTH DAILY) 90  tablet 0  . tiZANidine (ZANAFLEX) 4 MG tablet Take 1 tablet (4 mg total) by mouth at bedtime. 30 tablet 3  . traZODone (DESYREL) 100 MG tablet TAKE 1 TABLET BY MOUTH AT  BEDTIME 90 tablet 3   No current facility-administered medications for this visit.   Facility-Administered Medications Ordered in Other Visits  Medication Dose Route Frequency Provider Last Rate Last Admin  . 0.9 %  sodium chloride infusion   Intravenous Once [name], MD      . acetaminophen (TYLENOL) tablet 650 mg  650 mg Oral Once [name], MD      . dexamethasone (DECADRON) 20 mg in sodium chloride 0.9 % 50 mL IVPB  20 mg Intravenous Once [name], MD      . diphenhydrAMINE (BENADRYL) capsule 50 mg  50 mg Oral Once [name], MD      . famotidine (PEPCID) IVPB 20 mg premix  20 mg Intravenous Once [name], MD      . montelukast (SINGULAIR) tablet 10 mg  10 mg Oral Once [name], MD        REVIEW OF SYSTEMS:   A 10+ POINT REVIEW OF SYSTEMS WAS OBTAINED including neurology, dermatology, psychiatry, cardiac, respiratory, lymph, extremities, GI, GU, Musculoskeletal, constitutional, breasts, reproductive, HEENT.  All pertinent positives are noted in the HPI.  All others are negative.   PHYSICAL EXAMINATION: ECOG PERFORMANCE STATUS: 2 - Symptomatic, <50% confined to bed  VS reviewed GENERAL:alert, in no acute distress and comfortable SKIN: no acute rashes, no significant lesions EYES: conjunctiva are pink and non-injected, sclera anicteric OROPHARYNX: MMM, no exudates, no oropharyngeal erythema or ulceration NECK: supple, no JVD LYMPH:  no palpable lymphadenopathy in the cervical, axillary or inguinal regions LUNGS: clear to auscultation b/l with normal respiratory effort HEART: regular rate &  rhythm ABDOMEN:  normoactive bowel sounds , non tender, not distended. Extremity: no pedal edema PSYCH: alert & oriented x 3 with fluent speech NEURO: no  focal motor/sensory deficits   LABORATORY DATA:  I have reviewed the data as listed  . CBC Latest Ref Rng & Units 03/03/2020 02/25/2020 02/18/2020  WBC 4.0 - 10.5 K/uL 4.9 5.1 6.1  Hemoglobin 12.0 - 15.0 g/dL 10.4(L) 9.8(L) 7.1(L)  Hematocrit 36 - 46 % 32.7(L) 30.0(L) 22.1(L)  Platelets 150 - 400 K/uL 299 332 273    . CMP Latest Ref Rng & Units 03/03/2020 02/25/2020 02/18/2020  Glucose 70 - 99 mg/dL 157(H) 117(H) 111(H)  BUN 8 - 23 mg/dL 16 9 14  Creatinine 0.44 - 1.00 mg/dL 1.22(H) 1.14(H) 1.35(H)  Sodium 135 - 145 mmol/L 136 136 139  Potassium 3.5 - 5.1 mmol/L 3.8 3.5 3.6  Chloride 98 - 111 mmol/L 109 108 108  CO2 22 - 32 mmol/L 21(L) 21(L) 22  Calcium 8.9 - 10.3 mg/dL 8.6(L) 8.7(L) 8.8(L)  Total Protein 6.5 - 8.1 g/dL 9.7(H) 10.0(H) 10.0(H)  Total Bilirubin 0.3 - 1.2 mg/dL 0.2(L) 0.3 0.3  Alkaline Phos 38 - 126 U/L 102 96 85  AST 15 - 41 U/L 10(L) 12(L) 14(L)  ALT 0 - 44 U/L 11 13 10   01/25/2020 K/L light chains:    01/25/2020 MMP:            RADIOGRAPHIC STUDIES: I have personally reviewed the radiological images as listed and agreed with the findings in the report. DG Chest Port 1 View  Result Date: 02/13/2020 CLINICAL DATA:  Pt is c/o productive cough onset yesterday. Denies fever. Denies chest pain but is having pain to throat. H/o Breast Cancer, Chronic Bronchitis, Myocardial Infarction, HTN, Stroke. EXAM: PORTABLE CHEST 1 VIEW COMPARISON:  Chest radiograph 10/12/2018 FINDINGS: Stable cardiomediastinal contours aortic atherosclerotic calcification. Lungs are hyperinflated. Chronic diffuse bilateral coarse interstitial opacities. No new focal consolidation. No pneumothorax or large pleural effusion. No acute finding in the visualized skeleton. IMPRESSION: Chronic bronchitic changes.  No new consolidation. Electronically Signed   By: Nancy  Ballantyne M.D.   On: 02/13/2020 14:02    ASSESSMENT & PLAN:   73 yo with   1) Newly diagnosed IgG Kappa Multiple myeloma  with bone lesions, anemia, renal insuff. M spike @ 3.7g/dl 2) h/o Dm2 3) Diabetic Neuropathy 4) CKD - likely from DM2, but could have an element of myeloma kidney. 5) h/o TIA and AMI 6) Iron deficiency  PLAN: -Discussed pt labwork today, 03/03/20; of CBC w/diff and CMP is as follows: all values are WNL except for RBC at 3.40, Hemoglobin at 10.4, HCT at 32.7, CO2 at 21, Glucose at 157, Creatinine at 1.22, Calcium at 8.6, Total Protein at 9.7, Albumin at 2.9, AST at 10, Total Bilirubin at 0.2, GFR, Est Non Af Am at 43, GFR, Est AFR Am at 50 -Pt is here for C1D22 of Daratumumab and has no prohibitive toxicities necessitating treatment change. -Goal hemoglobin at >8 -Discussed CRAB criteria: no hypercalcemia, minor renal dysfunction that can be otherwise explained, significant anemia, bone lesions identified on scan  -Advised myeloma can cause generalized body aches and pains -Advised pt is anemic due to myeloma and iron deficiency  - possibility of blood transfusion and IV iron as needed -Advised iron deficiency due to bleeding  -Advised pt starting Bisphosphosphonates/Xgeva 2 months after root canal -Advised on additional medications to get optimized response- after first 2 months of treatment  -Advised on placing a   PORT -patient wants to think about it. -Recommend pt avoid bending, twisting, lifting, pushing, or pulling anything heavy  -Recommend pt f/u with PCP for coordination of care for diabetes control  -Continue taking Miralax and Senna  -Will refer pt to Radiation Oncology for RT of symptomatic lesions -Will continue pt on Daratumumab + Dexamethasone -Will see back in 3 weeks  FOLLOW UP: Plz schedule C2 of Daratumumab weekly x 4 with labs MD visit in 3 weeks   The total time spent in the appt was 30 minutes and more than 50% was on counseling and direct patient cares, treatment ordering and monitoring.  All of the patient's questions were answered with apparent satisfaction.  The patient knows to call the clinic with any problems, questions or concerns.  Gautam Kale MD MS AAHIVMS SCH CTH Hematology/Oncology Physician [facility]  (Office):       [phone] (Work cell):  [phone] (Fax):           [phone]  03/03/2020 12:01 PM  I, Elaine Hinkel am acting as a scribe for Dr. Gautam Kale.   .I have reviewed the above documentation for accuracy and completeness, and I agree with the above. .Gautam Kishore Kale MD          

## 2020-03-02 ENCOUNTER — Telehealth: Payer: Self-pay | Admitting: Hematology

## 2020-03-02 ENCOUNTER — Other Ambulatory Visit: Payer: Self-pay | Admitting: *Deleted

## 2020-03-02 ENCOUNTER — Other Ambulatory Visit: Payer: Self-pay

## 2020-03-02 ENCOUNTER — Ambulatory Visit
Admission: RE | Admit: 2020-03-02 | Discharge: 2020-03-02 | Disposition: A | Discharge status: Home / Self Care | Payer: Medicare Other | Source: Ambulatory Visit | Attending: Radiation Oncology | Admitting: Radiation Oncology

## 2020-03-02 ENCOUNTER — Ambulatory Visit: Payer: Medicare Other

## 2020-03-02 DIAGNOSIS — C7951 Secondary malignant neoplasm of bone: Secondary | ICD-10-CM | POA: Diagnosis not present

## 2020-03-02 DIAGNOSIS — Z7189 Other specified counseling: Secondary | ICD-10-CM

## 2020-03-02 DIAGNOSIS — C7952 Secondary malignant neoplasm of bone marrow: Secondary | ICD-10-CM | POA: Diagnosis not present

## 2020-03-02 DIAGNOSIS — C9000 Multiple myeloma not having achieved remission: Secondary | ICD-10-CM

## 2020-03-02 NOTE — Telephone Encounter (Signed)
Scheduled per 07/23 los, patient has been called and notified.

## 2020-03-03 ENCOUNTER — Inpatient Hospital Stay: Payer: Medicare Other | Attending: Hematology

## 2020-03-03 ENCOUNTER — Other Ambulatory Visit: Payer: Self-pay

## 2020-03-03 ENCOUNTER — Ambulatory Visit
Admission: RE | Admit: 2020-03-03 | Discharge: 2020-03-03 | Disposition: A | Discharge status: Home / Self Care | Payer: Medicare Other | Source: Ambulatory Visit | Attending: Radiation Oncology | Admitting: Radiation Oncology

## 2020-03-03 ENCOUNTER — Inpatient Hospital Stay: Payer: Medicare Other | Admitting: Hematology

## 2020-03-03 ENCOUNTER — Inpatient Hospital Stay: Payer: Medicare Other

## 2020-03-03 VITALS — BP 126/46 | HR 75 | Temp 98.5°F | Resp 16

## 2020-03-03 DIAGNOSIS — N189 Chronic kidney disease, unspecified: Secondary | ICD-10-CM | POA: Insufficient documentation

## 2020-03-03 DIAGNOSIS — Z8673 Personal history of transient ischemic attack (TIA), and cerebral infarction without residual deficits: Secondary | ICD-10-CM | POA: Insufficient documentation

## 2020-03-03 DIAGNOSIS — I252 Old myocardial infarction: Secondary | ICD-10-CM | POA: Diagnosis not present

## 2020-03-03 DIAGNOSIS — E1140 Type 2 diabetes mellitus with diabetic neuropathy, unspecified: Secondary | ICD-10-CM | POA: Diagnosis not present

## 2020-03-03 DIAGNOSIS — C9000 Multiple myeloma not having achieved remission: Secondary | ICD-10-CM

## 2020-03-03 DIAGNOSIS — D649 Anemia, unspecified: Secondary | ICD-10-CM | POA: Diagnosis not present

## 2020-03-03 DIAGNOSIS — C7951 Secondary malignant neoplasm of bone: Secondary | ICD-10-CM | POA: Diagnosis not present

## 2020-03-03 DIAGNOSIS — Z9221 Personal history of antineoplastic chemotherapy: Secondary | ICD-10-CM | POA: Insufficient documentation

## 2020-03-03 DIAGNOSIS — Z5112 Encounter for antineoplastic immunotherapy: Secondary | ICD-10-CM | POA: Insufficient documentation

## 2020-03-03 DIAGNOSIS — Z853 Personal history of malignant neoplasm of breast: Secondary | ICD-10-CM | POA: Insufficient documentation

## 2020-03-03 DIAGNOSIS — Z923 Personal history of irradiation: Secondary | ICD-10-CM | POA: Diagnosis not present

## 2020-03-03 DIAGNOSIS — Z87891 Personal history of nicotine dependence: Secondary | ICD-10-CM | POA: Diagnosis not present

## 2020-03-03 DIAGNOSIS — Z7189 Other specified counseling: Secondary | ICD-10-CM

## 2020-03-03 DIAGNOSIS — C7952 Secondary malignant neoplasm of bone marrow: Secondary | ICD-10-CM | POA: Diagnosis not present

## 2020-03-03 DIAGNOSIS — E1122 Type 2 diabetes mellitus with diabetic chronic kidney disease: Secondary | ICD-10-CM | POA: Insufficient documentation

## 2020-03-03 LAB — CBC WITH DIFFERENTIAL (CANCER CENTER ONLY)
Abs Immature Granulocytes: 0.01 10*3/uL (ref 0.00–0.07)
Basophils Absolute: 0 10*3/uL (ref 0.0–0.1)
Basophils Relative: 0 %
Eosinophils Absolute: 0 10*3/uL (ref 0.0–0.5)
Eosinophils Relative: 1 %
HCT: 32.7 % — ABNORMAL LOW (ref 36.0–46.0)
Hemoglobin: 10.4 g/dL — ABNORMAL LOW (ref 12.0–15.0)
Immature Granulocytes: 0 %
Lymphocytes Relative: 42 %
Lymphs Abs: 2.1 10*3/uL (ref 0.7–4.0)
MCH: 30.6 pg (ref 26.0–34.0)
MCHC: 31.8 g/dL (ref 30.0–36.0)
MCV: 96.2 fL (ref 80.0–100.0)
Monocytes Absolute: 0.3 10*3/uL (ref 0.1–1.0)
Monocytes Relative: 6 %
Neutro Abs: 2.5 10*3/uL (ref 1.7–7.7)
Neutrophils Relative %: 51 %
Platelet Count: 299 10*3/uL (ref 150–400)
RBC: 3.4 MIL/uL — ABNORMAL LOW (ref 3.87–5.11)
RDW: 15.4 % (ref 11.5–15.5)
WBC Count: 4.9 10*3/uL (ref 4.0–10.5)
nRBC: 0 % (ref 0.0–0.2)

## 2020-03-03 LAB — CMP (CANCER CENTER ONLY)
ALT: 11 U/L (ref 0–44)
AST: 10 U/L — ABNORMAL LOW (ref 15–41)
Albumin: 2.9 g/dL — ABNORMAL LOW (ref 3.5–5.0)
Alkaline Phosphatase: 102 U/L (ref 38–126)
Anion gap: 6 (ref 5–15)
BUN: 16 mg/dL (ref 8–23)
CO2: 21 mmol/L — ABNORMAL LOW (ref 22–32)
Calcium: 8.6 mg/dL — ABNORMAL LOW (ref 8.9–10.3)
Chloride: 109 mmol/L (ref 98–111)
Creatinine: 1.22 mg/dL — ABNORMAL HIGH (ref 0.44–1.00)
GFR, Est AFR Am: 50 mL/min — ABNORMAL LOW (ref >60)
GFR, Estimated: 43 mL/min — ABNORMAL LOW (ref >60)
Glucose, Bld: 157 mg/dL — ABNORMAL HIGH (ref 70–99)
Potassium: 3.8 mmol/L (ref 3.5–5.1)
Sodium: 136 mmol/L (ref 135–145)
Total Bilirubin: 0.2 mg/dL — ABNORMAL LOW (ref 0.3–1.2)
Total Protein: 9.7 g/dL — ABNORMAL HIGH (ref 6.5–8.1)

## 2020-03-03 LAB — SAMPLE TO BLOOD BANK

## 2020-03-03 MED ORDER — FAMOTIDINE IN NACL 20-0.9 MG/50ML-% IV SOLN
INTRAVENOUS | Status: AC
  Filled 2020-03-03: qty 50

## 2020-03-03 MED ORDER — MONTELUKAST SODIUM 10 MG PO TABS
10.0000 mg | ORAL_TABLET | Freq: Once | ORAL | Status: AC
  Administered 2020-03-03: 10 mg via ORAL

## 2020-03-03 MED ORDER — FAMOTIDINE IN NACL 20-0.9 MG/50ML-% IV SOLN
20.0000 mg | Freq: Once | INTRAVENOUS | Status: AC
  Administered 2020-03-03: 20 mg via INTRAVENOUS

## 2020-03-03 MED ORDER — DIPHENHYDRAMINE HCL 25 MG PO CAPS
ORAL_CAPSULE | ORAL | Status: AC
  Filled 2020-03-03: qty 2

## 2020-03-03 MED ORDER — ACETAMINOPHEN 325 MG PO TABS
ORAL_TABLET | ORAL | Status: AC
  Filled 2020-03-03: qty 2

## 2020-03-03 MED ORDER — SODIUM CHLORIDE 0.9 % IV SOLN
16.0000 mg/kg | Freq: Once | INTRAVENOUS | Status: AC
  Administered 2020-03-03: 1400 mg via INTRAVENOUS
  Filled 2020-03-03: qty 60

## 2020-03-03 MED ORDER — DIPHENHYDRAMINE HCL 25 MG PO CAPS
50.0000 mg | ORAL_CAPSULE | Freq: Once | ORAL | Status: AC
  Administered 2020-03-03: 50 mg via ORAL

## 2020-03-03 MED ORDER — SODIUM CHLORIDE 0.9 % IV SOLN
20.0000 mg | Freq: Once | INTRAVENOUS | Status: AC
  Administered 2020-03-03: 20 mg via INTRAVENOUS
  Filled 2020-03-03: qty 20

## 2020-03-03 MED ORDER — MONTELUKAST SODIUM 10 MG PO TABS
ORAL_TABLET | ORAL | Status: AC
  Filled 2020-03-03: qty 1

## 2020-03-03 MED ORDER — SODIUM CHLORIDE 0.9 % IV SOLN
Freq: Once | INTRAVENOUS | Status: AC
  Filled 2020-03-03: qty 250

## 2020-03-03 MED ORDER — ACETAMINOPHEN 325 MG PO TABS
650.0000 mg | ORAL_TABLET | Freq: Once | ORAL | Status: AC
  Administered 2020-03-03: 650 mg via ORAL

## 2020-03-03 NOTE — Patient Instructions (Signed)
[facility] Discharge Instructions for Patients Receiving Chemotherapy  Today you received the following chemotherapy agents Daratumumab (DARZALEX).  To help prevent nausea and vomiting after your treatment, we encourage you to take your nausea medication as prescribed.   If you develop nausea and vomiting that is not controlled by your nausea medication, call the clinic.   BELOW ARE SYMPTOMS THAT SHOULD BE REPORTED IMMEDIATELY:  *FEVER GREATER THAN 100.5 F  *CHILLS WITH OR WITHOUT FEVER  NAUSEA AND VOMITING THAT IS NOT CONTROLLED WITH YOUR NAUSEA MEDICATION  *UNUSUAL SHORTNESS OF BREATH  *UNUSUAL BRUISING OR BLEEDING  TENDERNESS IN MOUTH AND THROAT WITH OR WITHOUT PRESENCE OF ULCERS  *URINARY PROBLEMS  *BOWEL PROBLEMS  UNUSUAL RASH Items with * indicate a potential emergency and should be followed up as soon as possible.  Feel free to call the clinic should you have any questions or concerns. The clinic phone number is [phone].  Please show the CHEMO ALERT CARD at check-in to the Emergency Department and triage nurse.  Ferric carboxymaltose injection What is this medicine? FERRIC CARBOXYMALTOSE (ferr-ik car-box-ee-mol-toes) is an iron complex. Iron is used to make healthy red blood cells, which carry oxygen and nutrients throughout the body. This medicine is used to treat anemia in people with chronic kidney disease or people who cannot take iron by mouth. This medicine may be used for other purposes; ask your health care provider or pharmacist if you have questions. COMMON BRAND NAME(S): Injectafer What should I tell my health care provider before I take this medicine? They need to know if you have any of these conditions:  high levels of iron in the blood  liver disease  an unusual or allergic reaction to iron, other medicines, foods, dyes, or preservatives  pregnant or trying to get pregnant  breast-feeding How should I use this  medicine? This medicine is for infusion into a vein. It is given by a health care professional in a hospital or clinic setting. Talk to your pediatrician regarding the use of this medicine in children. Special care may be needed. Overdosage: If you think you have taken too much of this medicine contact a poison control center or emergency room at once. NOTE: This medicine is only for you. Do not share this medicine with others. What if I miss a dose? It is important not to miss your dose. Call your doctor or health care professional if you are unable to keep an appointment. What may interact with this medicine? Do not take this medicine with any of the following medications:  deferoxamine  dimercaprol  other iron products This list may not describe all possible interactions. Give your health care provider a list of all the medicines, herbs, non-prescription drugs, or dietary supplements you use. Also tell them if you smoke, drink alcohol, or use illegal drugs. Some items may interact with your medicine. What should I watch for while using this medicine? Visit your doctor or health care professional regularly. Tell your doctor if your symptoms do not start to get better or if they get worse. You may need blood work done while you are taking this medicine. You may need to follow a special diet. Talk to your doctor. Foods that contain iron include: whole grains/cereals, dried fruits, beans, or peas, leafy green vegetables, and organ meats (liver, kidney). What side effects may I notice from receiving this medicine? Side effects that you should report to your doctor or health care professional as soon as possible:    allergic reactions like skin rash, itching or hives, swelling of the face, lips, or tongue  dizziness  facial flushing Side effects that usually do not require medical attention (report to your doctor or health care professional if they continue or are bothersome):  changes in  taste  constipation  headache  nausea, vomiting  pain, redness, or irritation at site where injected This list may not describe all possible side effects. Call your doctor for medical advice about side effects. You may report side effects to FDA at 1-800-FDA-1088. Where should I keep my medicine? This drug is given in a hospital or clinic and will not be stored at home. NOTE: This sheet is a summary. It may not cover all possible information. If you have questions about this medicine, talk to your doctor, pharmacist, or health care provider.  2020 Elsevier/Gold Standard (2016-08-29 09:40:29)  

## 2020-03-04 ENCOUNTER — Other Ambulatory Visit: Payer: Self-pay | Admitting: Family Medicine

## 2020-03-06 ENCOUNTER — Telehealth: Payer: Self-pay | Admitting: Family Medicine

## 2020-03-06 ENCOUNTER — Other Ambulatory Visit: Payer: Self-pay

## 2020-03-06 ENCOUNTER — Other Ambulatory Visit: Payer: Self-pay | Admitting: General Practice

## 2020-03-06 ENCOUNTER — Ambulatory Visit
Admission: RE | Admit: 2020-03-06 | Discharge: 2020-03-06 | Disposition: A | Discharge status: Home / Self Care | Payer: Medicare Other | Source: Ambulatory Visit | Attending: Radiation Oncology | Admitting: Radiation Oncology

## 2020-03-06 DIAGNOSIS — C7952 Secondary malignant neoplasm of bone marrow: Secondary | ICD-10-CM | POA: Diagnosis not present

## 2020-03-06 DIAGNOSIS — C7951 Secondary malignant neoplasm of bone: Secondary | ICD-10-CM | POA: Diagnosis not present

## 2020-03-06 DIAGNOSIS — C9000 Multiple myeloma not having achieved remission: Secondary | ICD-10-CM | POA: Diagnosis not present

## 2020-03-06 NOTE — Telephone Encounter (Signed)
Patient called and stated that she needs all her medications that you send to [facility] to be updated.  Patient could not tell me what these medications were.  Last visit 01/13/2020

## 2020-03-06 NOTE — Telephone Encounter (Signed)
Called pt and advised that it appears on our record she has refills of her medications. Advised her to contact [facility] and have them send me a refill request of what is needed.

## 2020-03-07 ENCOUNTER — Other Ambulatory Visit: Payer: Self-pay

## 2020-03-07 ENCOUNTER — Telehealth: Payer: Self-pay | Admitting: Hematology

## 2020-03-07 ENCOUNTER — Ambulatory Visit
Admission: RE | Admit: 2020-03-07 | Discharge: 2020-03-07 | Disposition: A | Discharge status: Home / Self Care | Payer: Medicare Other | Source: Ambulatory Visit | Attending: Radiation Oncology | Admitting: Radiation Oncology

## 2020-03-07 DIAGNOSIS — C7951 Secondary malignant neoplasm of bone: Secondary | ICD-10-CM | POA: Diagnosis not present

## 2020-03-07 DIAGNOSIS — C9000 Multiple myeloma not having achieved remission: Secondary | ICD-10-CM | POA: Diagnosis not present

## 2020-03-07 DIAGNOSIS — C7952 Secondary malignant neoplasm of bone marrow: Secondary | ICD-10-CM | POA: Diagnosis not present

## 2020-03-07 NOTE — Telephone Encounter (Signed)
Scheduled per 08/06 los, patient has been called and voicemail was left. 

## 2020-03-08 ENCOUNTER — Other Ambulatory Visit: Payer: Self-pay

## 2020-03-08 ENCOUNTER — Ambulatory Visit
Admission: RE | Admit: 2020-03-08 | Discharge: 2020-03-08 | Disposition: A | Discharge status: Home / Self Care | Payer: Medicare Other | Source: Ambulatory Visit | Attending: Radiation Oncology | Admitting: Radiation Oncology

## 2020-03-08 DIAGNOSIS — C7951 Secondary malignant neoplasm of bone: Secondary | ICD-10-CM | POA: Diagnosis not present

## 2020-03-08 DIAGNOSIS — C9000 Multiple myeloma not having achieved remission: Secondary | ICD-10-CM | POA: Diagnosis not present

## 2020-03-08 DIAGNOSIS — C7952 Secondary malignant neoplasm of bone marrow: Secondary | ICD-10-CM | POA: Diagnosis not present

## 2020-03-09 ENCOUNTER — Ambulatory Visit
Admission: RE | Admit: 2020-03-09 | Discharge: 2020-03-09 | Disposition: A | Discharge status: Home / Self Care | Payer: Medicare Other | Source: Ambulatory Visit | Attending: Radiation Oncology | Admitting: Radiation Oncology

## 2020-03-09 ENCOUNTER — Other Ambulatory Visit: Payer: Self-pay | Admitting: *Deleted

## 2020-03-09 ENCOUNTER — Other Ambulatory Visit: Payer: Self-pay

## 2020-03-09 DIAGNOSIS — C9000 Multiple myeloma not having achieved remission: Secondary | ICD-10-CM | POA: Diagnosis not present

## 2020-03-09 DIAGNOSIS — C7951 Secondary malignant neoplasm of bone: Secondary | ICD-10-CM | POA: Diagnosis not present

## 2020-03-09 DIAGNOSIS — D649 Anemia, unspecified: Secondary | ICD-10-CM

## 2020-03-09 DIAGNOSIS — C7952 Secondary malignant neoplasm of bone marrow: Secondary | ICD-10-CM | POA: Diagnosis not present

## 2020-03-10 ENCOUNTER — Inpatient Hospital Stay: Payer: Medicare Other

## 2020-03-10 ENCOUNTER — Other Ambulatory Visit: Payer: Self-pay

## 2020-03-10 ENCOUNTER — Ambulatory Visit
Admission: RE | Admit: 2020-03-10 | Discharge: 2020-03-10 | Disposition: A | Discharge status: Home / Self Care | Payer: Medicare Other | Source: Ambulatory Visit | Attending: Radiation Oncology | Admitting: Radiation Oncology

## 2020-03-10 VITALS — BP 158/71 | HR 86 | Temp 98.4°F | Resp 18 | Ht 60.0 in | Wt 183.2 lb

## 2020-03-10 DIAGNOSIS — Z8673 Personal history of transient ischemic attack (TIA), and cerebral infarction without residual deficits: Secondary | ICD-10-CM | POA: Diagnosis not present

## 2020-03-10 DIAGNOSIS — C7951 Secondary malignant neoplasm of bone: Secondary | ICD-10-CM | POA: Diagnosis not present

## 2020-03-10 DIAGNOSIS — Z5112 Encounter for antineoplastic immunotherapy: Secondary | ICD-10-CM | POA: Diagnosis not present

## 2020-03-10 DIAGNOSIS — Z87891 Personal history of nicotine dependence: Secondary | ICD-10-CM | POA: Diagnosis not present

## 2020-03-10 DIAGNOSIS — Z853 Personal history of malignant neoplasm of breast: Secondary | ICD-10-CM | POA: Diagnosis not present

## 2020-03-10 DIAGNOSIS — E1122 Type 2 diabetes mellitus with diabetic chronic kidney disease: Secondary | ICD-10-CM | POA: Diagnosis not present

## 2020-03-10 DIAGNOSIS — Z7189 Other specified counseling: Secondary | ICD-10-CM

## 2020-03-10 DIAGNOSIS — C9000 Multiple myeloma not having achieved remission: Secondary | ICD-10-CM

## 2020-03-10 DIAGNOSIS — E1140 Type 2 diabetes mellitus with diabetic neuropathy, unspecified: Secondary | ICD-10-CM | POA: Diagnosis not present

## 2020-03-10 DIAGNOSIS — Z923 Personal history of irradiation: Secondary | ICD-10-CM | POA: Diagnosis not present

## 2020-03-10 DIAGNOSIS — I252 Old myocardial infarction: Secondary | ICD-10-CM | POA: Diagnosis not present

## 2020-03-10 DIAGNOSIS — Z9221 Personal history of antineoplastic chemotherapy: Secondary | ICD-10-CM | POA: Diagnosis not present

## 2020-03-10 DIAGNOSIS — N189 Chronic kidney disease, unspecified: Secondary | ICD-10-CM | POA: Diagnosis not present

## 2020-03-10 DIAGNOSIS — C7952 Secondary malignant neoplasm of bone marrow: Secondary | ICD-10-CM | POA: Diagnosis not present

## 2020-03-10 DIAGNOSIS — D649 Anemia, unspecified: Secondary | ICD-10-CM

## 2020-03-10 LAB — CBC WITH DIFFERENTIAL (CANCER CENTER ONLY)
Abs Immature Granulocytes: 0.01 10*3/uL (ref 0.00–0.07)
Basophils Absolute: 0 10*3/uL (ref 0.0–0.1)
Basophils Relative: 0 %
Eosinophils Absolute: 0 10*3/uL (ref 0.0–0.5)
Eosinophils Relative: 1 %
HCT: 29.8 % — ABNORMAL LOW (ref 36.0–46.0)
Hemoglobin: 9.6 g/dL — ABNORMAL LOW (ref 12.0–15.0)
Immature Granulocytes: 0 %
Lymphocytes Relative: 26 %
Lymphs Abs: 1.1 10*3/uL (ref 0.7–4.0)
MCH: 31 pg (ref 26.0–34.0)
MCHC: 32.2 g/dL (ref 30.0–36.0)
MCV: 96.1 fL (ref 80.0–100.0)
Monocytes Absolute: 0.2 10*3/uL (ref 0.1–1.0)
Monocytes Relative: 5 %
Neutro Abs: 2.9 10*3/uL (ref 1.7–7.7)
Neutrophils Relative %: 68 %
Platelet Count: 203 10*3/uL (ref 150–400)
RBC: 3.1 MIL/uL — ABNORMAL LOW (ref 3.87–5.11)
RDW: 14.6 % (ref 11.5–15.5)
WBC Count: 4.4 10*3/uL (ref 4.0–10.5)
nRBC: 0 % (ref 0.0–0.2)

## 2020-03-10 LAB — SAMPLE TO BLOOD BANK

## 2020-03-10 LAB — CMP (CANCER CENTER ONLY)
ALT: 10 U/L (ref 0–44)
AST: 9 U/L — ABNORMAL LOW (ref 15–41)
Albumin: 2.7 g/dL — ABNORMAL LOW (ref 3.5–5.0)
Alkaline Phosphatase: 96 U/L (ref 38–126)
Anion gap: 7 (ref 5–15)
BUN: 10 mg/dL (ref 8–23)
CO2: 20 mmol/L — ABNORMAL LOW (ref 22–32)
Calcium: 8.4 mg/dL — ABNORMAL LOW (ref 8.9–10.3)
Chloride: 109 mmol/L (ref 98–111)
Creatinine: 1 mg/dL (ref 0.44–1.00)
GFR, Est AFR Am: >60 mL/min (ref >60)
GFR, Estimated: 55 mL/min — ABNORMAL LOW (ref >60)
Glucose, Bld: 182 mg/dL — ABNORMAL HIGH (ref 70–99)
Potassium: 3.8 mmol/L (ref 3.5–5.1)
Sodium: 136 mmol/L (ref 135–145)
Total Bilirubin: <0.2 mg/dL — ABNORMAL LOW (ref 0.3–1.2)
Total Protein: 8.4 g/dL — ABNORMAL HIGH (ref 6.5–8.1)

## 2020-03-10 MED ORDER — MONTELUKAST SODIUM 10 MG PO TABS
10.0000 mg | ORAL_TABLET | Freq: Once | ORAL | Status: AC
  Administered 2020-03-10: 10 mg via ORAL

## 2020-03-10 MED ORDER — SODIUM CHLORIDE 0.9 % IV SOLN
Freq: Once | INTRAVENOUS | Status: AC
  Filled 2020-03-10: qty 250

## 2020-03-10 MED ORDER — SODIUM CHLORIDE 0.9 % IV SOLN
16.0000 mg/kg | Freq: Once | INTRAVENOUS | Status: AC
  Administered 2020-03-10: 1400 mg via INTRAVENOUS
  Filled 2020-03-10: qty 60

## 2020-03-10 MED ORDER — FAMOTIDINE IN NACL 20-0.9 MG/50ML-% IV SOLN
INTRAVENOUS | Status: AC
  Filled 2020-03-10: qty 50

## 2020-03-10 MED ORDER — ACETAMINOPHEN 325 MG PO TABS
650.0000 mg | ORAL_TABLET | Freq: Once | ORAL | Status: AC
  Administered 2020-03-10: 650 mg via ORAL

## 2020-03-10 MED ORDER — DIPHENHYDRAMINE HCL 25 MG PO CAPS
50.0000 mg | ORAL_CAPSULE | Freq: Once | ORAL | Status: AC
  Administered 2020-03-10: 50 mg via ORAL

## 2020-03-10 MED ORDER — ACETAMINOPHEN 325 MG PO TABS
ORAL_TABLET | ORAL | Status: AC
  Filled 2020-03-10: qty 2

## 2020-03-10 MED ORDER — SODIUM CHLORIDE 0.9 % IV SOLN
20.0000 mg | Freq: Once | INTRAVENOUS | Status: AC
  Administered 2020-03-10: 20 mg via INTRAVENOUS
  Filled 2020-03-10: qty 20

## 2020-03-10 MED ORDER — MONTELUKAST SODIUM 10 MG PO TABS
ORAL_TABLET | ORAL | Status: AC
  Filled 2020-03-10: qty 1

## 2020-03-10 MED ORDER — DIPHENHYDRAMINE HCL 25 MG PO CAPS
ORAL_CAPSULE | ORAL | Status: AC
  Filled 2020-03-10: qty 2

## 2020-03-10 MED ORDER — FAMOTIDINE IN NACL 20-0.9 MG/50ML-% IV SOLN
20.0000 mg | Freq: Once | INTRAVENOUS | Status: AC
  Administered 2020-03-10: 20 mg via INTRAVENOUS

## 2020-03-10 NOTE — Patient Instructions (Signed)
[facility] Discharge Instructions for Patients Receiving Chemotherapy  Today you received the following chemotherapy agents Daratumumab (DARZALEX).  To help prevent nausea and vomiting after your treatment, we encourage you to take your nausea medication as prescribed.   If you develop nausea and vomiting that is not controlled by your nausea medication, call the clinic.   BELOW ARE SYMPTOMS THAT SHOULD BE REPORTED IMMEDIATELY:  *FEVER GREATER THAN 100.5 F  *CHILLS WITH OR WITHOUT FEVER  NAUSEA AND VOMITING THAT IS NOT CONTROLLED WITH YOUR NAUSEA MEDICATION  *UNUSUAL SHORTNESS OF BREATH  *UNUSUAL BRUISING OR BLEEDING  TENDERNESS IN MOUTH AND THROAT WITH OR WITHOUT PRESENCE OF ULCERS  *URINARY PROBLEMS  *BOWEL PROBLEMS  UNUSUAL RASH Items with * indicate a potential emergency and should be followed up as soon as possible.  Feel free to call the clinic should you have any questions or concerns. The clinic phone number is [phone].  Please show the [place] at check-in to the Emergency Department and triage nurse.  Ferric carboxymaltose injection What is this medicine? FERRIC CARBOXYMALTOSE (ferr-ik car-box-ee-mol-toes) is an iron complex. Iron is used to make healthy red blood cells, which carry oxygen and nutrients throughout the body. This medicine is used to treat anemia in people with chronic kidney disease or people who cannot take iron by mouth. This medicine may be used for other purposes; ask your health care provider or pharmacist if you have questions. COMMON BRAND NAME(S): Injectafer What should I tell my health care provider before I take this medicine? They need to know if you have any of these conditions:  high levels of iron in the blood  liver disease  an unusual or allergic reaction to iron, other medicines, foods, dyes, or preservatives  pregnant or trying to get pregnant  breast-feeding How should I use this  medicine? This medicine is for infusion into a vein. It is given by a health care professional in a hospital or clinic setting. Talk to your pediatrician regarding the use of this medicine in children. Special care may be needed. Overdosage: If you think you have taken too much of this medicine contact a poison control center or emergency room at once. NOTE: This medicine is only for you. Do not share this medicine with others. What if I miss a dose? It is important not to miss your dose. Call your doctor or health care professional if you are unable to keep an appointment. What may interact with this medicine? Do not take this medicine with any of the following medications:  deferoxamine  dimercaprol  other iron products This list may not describe all possible interactions. Give your health care provider a list of all the medicines, herbs, non-prescription drugs, or dietary supplements you use. Also tell them if you smoke, drink alcohol, or use illegal drugs. Some items may interact with your medicine. What should I watch for while using this medicine? Visit your doctor or health care professional regularly. Tell your doctor if your symptoms do not start to get better or if they get worse. You may need blood work done while you are taking this medicine. You may need to follow a special diet. Talk to your doctor. Foods that contain iron include: whole grains/cereals, dried fruits, beans, or peas, leafy green vegetables, and organ meats (liver, kidney). What side effects may I notice from receiving this medicine? Side effects that you should report to your doctor or health care professional as soon as possible:  allergic reactions like skin rash, itching or hives, swelling of the face, lips, or tongue  dizziness  facial flushing Side effects that usually do not require medical attention (report to your doctor or health care professional if they continue or are bothersome):  changes in  taste  constipation  headache  nausea, vomiting  pain, redness, or irritation at site where injected This list may not describe all possible side effects. Call your doctor for medical advice about side effects. You may report side effects to FDA at 1-800-FDA-1088. Where should I keep my medicine? This drug is given in a hospital or clinic and will not be stored at home. NOTE: This sheet is a summary. It may not cover all possible information. If you have questions about this medicine, talk to your doctor, pharmacist, or health care provider.  2020 Elsevier/Gold Standard (2016-08-29 09:40:29)

## 2020-03-13 ENCOUNTER — Other Ambulatory Visit: Payer: Self-pay

## 2020-03-13 ENCOUNTER — Encounter: Payer: Self-pay | Admitting: Family Medicine

## 2020-03-13 ENCOUNTER — Ambulatory Visit: Payer: Medicare Other

## 2020-03-13 ENCOUNTER — Ambulatory Visit
Admission: RE | Admit: 2020-03-13 | Discharge: 2020-03-13 | Disposition: A | Discharge status: Home / Self Care | Payer: Medicare Other | Source: Ambulatory Visit | Attending: Radiation Oncology | Admitting: Radiation Oncology

## 2020-03-13 DIAGNOSIS — C9000 Multiple myeloma not having achieved remission: Secondary | ICD-10-CM | POA: Diagnosis not present

## 2020-03-13 DIAGNOSIS — C7951 Secondary malignant neoplasm of bone: Secondary | ICD-10-CM | POA: Diagnosis not present

## 2020-03-13 DIAGNOSIS — C7952 Secondary malignant neoplasm of bone marrow: Secondary | ICD-10-CM | POA: Diagnosis not present

## 2020-03-14 ENCOUNTER — Ambulatory Visit: Payer: Medicare Other

## 2020-03-14 ENCOUNTER — Other Ambulatory Visit: Payer: Self-pay | Admitting: Family Medicine

## 2020-03-15 ENCOUNTER — Telehealth: Payer: Self-pay | Admitting: Family Medicine

## 2020-03-15 ENCOUNTER — Ambulatory Visit: Payer: Medicare Other

## 2020-03-15 NOTE — Telephone Encounter (Signed)
Please advise? Pt has not been on this medication since 2014

## 2020-03-15 NOTE — Telephone Encounter (Signed)
Pt called asking if her Mometasone could be sent to [facility] on [address]. I don't see the medication on her med list - she said she has been prescribed it. Please advise.

## 2020-03-16 MED ORDER — MOMETASONE FUROATE 50 MCG/ACT NA SUSP: 2.0000 | Freq: Every day | NASAL | 12 refills | Status: DC

## 2020-03-16 NOTE — Telephone Encounter (Signed)
Prescription sent at pt's request 

## 2020-03-17 ENCOUNTER — Inpatient Hospital Stay: Payer: Medicare Other

## 2020-03-17 ENCOUNTER — Other Ambulatory Visit: Payer: Self-pay

## 2020-03-17 ENCOUNTER — Other Ambulatory Visit: Payer: Self-pay | Admitting: Medical

## 2020-03-17 ENCOUNTER — Ambulatory Visit (HOSPITAL_BASED_OUTPATIENT_CLINIC_OR_DEPARTMENT_OTHER): Payer: Medicare Other | Admitting: Medical

## 2020-03-17 VITALS — BP 166/78 | HR 82 | Temp 98.5°F | Resp 18 | Wt 183.5 lb

## 2020-03-17 DIAGNOSIS — C9000 Multiple myeloma not having achieved remission: Secondary | ICD-10-CM

## 2020-03-17 DIAGNOSIS — Z923 Personal history of irradiation: Secondary | ICD-10-CM | POA: Diagnosis not present

## 2020-03-17 DIAGNOSIS — Z87891 Personal history of nicotine dependence: Secondary | ICD-10-CM | POA: Diagnosis not present

## 2020-03-17 DIAGNOSIS — N189 Chronic kidney disease, unspecified: Secondary | ICD-10-CM | POA: Diagnosis not present

## 2020-03-17 DIAGNOSIS — Z5112 Encounter for antineoplastic immunotherapy: Secondary | ICD-10-CM | POA: Diagnosis not present

## 2020-03-17 DIAGNOSIS — E1122 Type 2 diabetes mellitus with diabetic chronic kidney disease: Secondary | ICD-10-CM | POA: Diagnosis not present

## 2020-03-17 DIAGNOSIS — J40 Bronchitis, not specified as acute or chronic: Secondary | ICD-10-CM

## 2020-03-17 DIAGNOSIS — Z9221 Personal history of antineoplastic chemotherapy: Secondary | ICD-10-CM | POA: Diagnosis not present

## 2020-03-17 DIAGNOSIS — E1140 Type 2 diabetes mellitus with diabetic neuropathy, unspecified: Secondary | ICD-10-CM | POA: Diagnosis not present

## 2020-03-17 DIAGNOSIS — I252 Old myocardial infarction: Secondary | ICD-10-CM | POA: Diagnosis not present

## 2020-03-17 DIAGNOSIS — Z853 Personal history of malignant neoplasm of breast: Secondary | ICD-10-CM | POA: Diagnosis not present

## 2020-03-17 DIAGNOSIS — Z8673 Personal history of transient ischemic attack (TIA), and cerebral infarction without residual deficits: Secondary | ICD-10-CM | POA: Diagnosis not present

## 2020-03-17 DIAGNOSIS — Z7189 Other specified counseling: Secondary | ICD-10-CM

## 2020-03-17 MED ORDER — SODIUM CHLORIDE 0.9 % IV SOLN
20.0000 mg | Freq: Once | INTRAVENOUS | Status: AC
  Administered 2020-03-17: 20 mg via INTRAVENOUS
  Filled 2020-03-17: qty 20

## 2020-03-17 MED ORDER — ALBUTEROL SULFATE HFA 108 (90 BASE) MCG/ACT IN AERS: 2.0000 | INHALATION_SPRAY | Freq: Four times a day (QID) | RESPIRATORY_TRACT | 2 refills | Status: DC | PRN

## 2020-03-17 MED ORDER — SODIUM CHLORIDE 0.9 % IV SOLN
16.0000 mg/kg | Freq: Once | INTRAVENOUS | Status: AC
  Administered 2020-03-17: 1400 mg via INTRAVENOUS
  Filled 2020-03-17: qty 60

## 2020-03-17 MED ORDER — ACETAMINOPHEN 325 MG PO TABS
650.0000 mg | ORAL_TABLET | Freq: Once | ORAL | Status: AC
  Administered 2020-03-17: 650 mg via ORAL

## 2020-03-17 MED ORDER — DIPHENHYDRAMINE HCL 25 MG PO CAPS
ORAL_CAPSULE | ORAL | Status: AC
  Filled 2020-03-17: qty 2

## 2020-03-17 MED ORDER — ACETAMINOPHEN 325 MG PO TABS
ORAL_TABLET | ORAL | Status: AC
  Filled 2020-03-17: qty 2

## 2020-03-17 MED ORDER — AZITHROMYCIN 250 MG PO TABS: ORAL_TABLET | ORAL | 0 refills | Status: DC

## 2020-03-17 MED ORDER — FLUTICASONE PROPIONATE HFA 110 MCG/ACT IN AERO: 2.0000 | INHALATION_SPRAY | Freq: Two times a day (BID) | RESPIRATORY_TRACT | 12 refills | Status: DC

## 2020-03-17 MED ORDER — MONTELUKAST SODIUM 10 MG PO TABS
10.0000 mg | ORAL_TABLET | Freq: Once | ORAL | Status: AC
  Administered 2020-03-17: 10 mg via ORAL

## 2020-03-17 MED ORDER — MONTELUKAST SODIUM 10 MG PO TABS
ORAL_TABLET | ORAL | Status: AC
  Filled 2020-03-17: qty 1

## 2020-03-17 MED ORDER — SODIUM CHLORIDE 0.9 % IV SOLN
Freq: Once | INTRAVENOUS | Status: AC
  Filled 2020-03-17: qty 250

## 2020-03-17 MED ORDER — FAMOTIDINE IN NACL 20-0.9 MG/50ML-% IV SOLN
INTRAVENOUS | Status: AC
  Filled 2020-03-17: qty 50

## 2020-03-17 MED ORDER — FAMOTIDINE IN NACL 20-0.9 MG/50ML-% IV SOLN
20.0000 mg | Freq: Once | INTRAVENOUS | Status: AC
  Administered 2020-03-17: 20 mg via INTRAVENOUS

## 2020-03-17 MED ORDER — DIPHENHYDRAMINE HCL 25 MG PO CAPS
50.0000 mg | ORAL_CAPSULE | Freq: Once | ORAL | Status: AC
  Administered 2020-03-17: 50 mg via ORAL

## 2020-03-17 NOTE — Patient Instructions (Signed)
[facility] Discharge Instructions for Patients Receiving Chemotherapy  Today you received the following chemotherapy agents Daratumumab (DARZALEX).  To help prevent nausea and vomiting after your treatment, we encourage you to take your nausea medication as prescribed.   If you develop nausea and vomiting that is not controlled by your nausea medication, call the clinic.   BELOW ARE SYMPTOMS THAT SHOULD BE REPORTED IMMEDIATELY:  *FEVER GREATER THAN 100.5 F  *CHILLS WITH OR WITHOUT FEVER  NAUSEA AND VOMITING THAT IS NOT CONTROLLED WITH YOUR NAUSEA MEDICATION  *UNUSUAL SHORTNESS OF BREATH  *UNUSUAL BRUISING OR BLEEDING  TENDERNESS IN MOUTH AND THROAT WITH OR WITHOUT PRESENCE OF ULCERS  *URINARY PROBLEMS  *BOWEL PROBLEMS  UNUSUAL RASH Items with * indicate a potential emergency and should be followed up as soon as possible.  Feel free to call the clinic should you have any questions or concerns. The clinic phone number is [phone].  Please show the [place] at check-in to the Emergency Department and triage nurse.  Ferric carboxymaltose injection What is this medicine? FERRIC CARBOXYMALTOSE (ferr-ik car-box-ee-mol-toes) is an iron complex. Iron is used to make healthy red blood cells, which carry oxygen and nutrients throughout the body. This medicine is used to treat anemia in people with chronic kidney disease or people who cannot take iron by mouth. This medicine may be used for other purposes; ask your health care provider or pharmacist if you have questions. COMMON BRAND NAME(S): Injectafer What should I tell my health care provider before I take this medicine? They need to know if you have any of these conditions:  high levels of iron in the blood  liver disease  an unusual or allergic reaction to iron, other medicines, foods, dyes, or preservatives  pregnant or trying to get pregnant  breast-feeding How should I use this  medicine? This medicine is for infusion into a vein. It is given by a health care professional in a hospital or clinic setting. Talk to your pediatrician regarding the use of this medicine in children. Special care may be needed. Overdosage: If you think you have taken too much of this medicine contact a poison control center or emergency room at once. NOTE: This medicine is only for you. Do not share this medicine with others. What if I miss a dose? It is important not to miss your dose. Call your doctor or health care professional if you are unable to keep an appointment. What may interact with this medicine? Do not take this medicine with any of the following medications:  deferoxamine  dimercaprol  other iron products This list may not describe all possible interactions. Give your health care provider a list of all the medicines, herbs, non-prescription drugs, or dietary supplements you use. Also tell them if you smoke, drink alcohol, or use illegal drugs. Some items may interact with your medicine. What should I watch for while using this medicine? Visit your doctor or health care professional regularly. Tell your doctor if your symptoms do not start to get better or if they get worse. You may need blood work done while you are taking this medicine. You may need to follow a special diet. Talk to your doctor. Foods that contain iron include: whole grains/cereals, dried fruits, beans, or peas, leafy green vegetables, and organ meats (liver, kidney). What side effects may I notice from receiving this medicine? Side effects that you should report to your doctor or health care professional as soon as possible:  allergic reactions like skin rash, itching or hives, swelling of the face, lips, or tongue  dizziness  facial flushing Side effects that usually do not require medical attention (report to your doctor or health care professional if they continue or are bothersome):  changes in  taste  constipation  headache  nausea, vomiting  pain, redness, or irritation at site where injected This list may not describe all possible side effects. Call your doctor for medical advice about side effects. You may report side effects to FDA at 1-800-FDA-1088. Where should I keep my medicine? This drug is given in a hospital or clinic and will not be stored at home. NOTE: This sheet is a summary. It may not cover all possible information. If you have questions about this medicine, talk to your doctor, pharmacist, or health care provider.  2020 Elsevier/Gold Standard (2016-08-29 09:40:29)

## 2020-03-17 NOTE — Progress Notes (Signed)
[facility]pt[facility] Management Clinic Progress Note   Tracy [name] [id number] 06-Jul-1946 74 y.o.  Tracy [name] is managed by Dr. [name]  Actively treated with chemotherapy/immunotherapy/hormonal therapy: yes  Current therapy: Darzalex  Last treated: 03/10/2020 (cycle 2, day 1)  Next scheduled appointment with provider: 03/24/2020  Assessment: Plan:    Bronchitis  Multiple myeloma not having achieved remission ([place])   Bronchitis: The patient was given a prescription for an albuterol inhaler, Flovent inhaler, and a Z-Pak.  Multiple myeloma not having achieved remission: The patient continues to be managed by Dr. [name] and is status post cycle 2, day 1 of Darzalex which was dosed on 03/10/2020. She is receiving cycle 2, day 8 of therapy today and will be seen in follow-up on 03/24/2020.  Please see After Visit Summary for patient specific instructions.  Future Appointments  Date Time Provider [place]  03/24/2020 10:30 AM [facility] LAB [facility] None  03/24/2020 11:30 AM [facility] INFUSION [facility] None  03/24/2020 12:40 PM [name], MD [facility] None  03/29/2020 10:00 AM [facility] LAB [facility] None  03/29/2020 10:40 AM [name], MD [facility] None  03/30/2020  9:00 AM [facility] INFUSION [facility] None  04/05/2020  1:30 PM Milus Banister, MD [facility]  04/13/2020  1:00 PM Bruning, Ashlyn, PA-C [facility] None  05/12/2020  1:00 PM [facility] PHARMACIST [facility]    No orders of the defined types were placed in this encounter.      Subjective:   Patient ID:  Tracy [name] is a 74 y.o. (DOB April 12, 1946) female.  Chief Complaint: No chief complaint on file.   HPI Tracy [name]  is a 74 y.o. female with a diagnosis of a multiple myeloma not having achieved remission. She continues to be managed by Dr. [name] and is status post cycle 2, day 1 of Darzalex which was dosed on 03/10/2020. She is receiving  cycle 2, day 8 of therapy today. She was seen in the infusion room today. She reports that she has had a recent sinus infection and is being treated with a steroid nasal spray. She also has a history of recurrent bronchitis and reports having some chest congestion. She has a cough with greenish-yellow sputum. She denies fevers, chills, or sweats.  Medications: I have reviewed the patient's current medications.  Allergies:  Allergies  Allergen Reactions  . Bacitracin-Neomycin-Polymyxin  [Neomycin-Bacitracin Zn-Polymyx] Swelling  . Nsaids Other (See Comments)    nosebleeds  . Tape Hives    Pt cannot tolerate bandaids, tape, or any other adhesives.   . Ambien [Zolpidem Tartrate]     Hives   . Amoxicillin     Rash   . Contrast Media [Iodinated Diagnostic Agents] Hives    Pt states hives with prior ct, was given benadryl to resolve  . Latex Swelling  . Prednisone     Pt tolerates Dexamethasone. Pt cannot recall what happens when she takes Prednisone    Past Medical History:  Diagnosis Date  . Anxiety   . Arthritis   . Breast cancer ([place]) 06/13/15  . Cancer ([place]) 2000   breast cancer  . Chronic bronchitis ([place])   . Chronic bronchitis ([place])   . Hyperlipidemia   . Hypertension   . Myocardial infarction ([place]) 2001  . Personal history of radiation therapy   . Restless leg   . Stroke [facility] 2004   TIA, no deficits    Past Surgical History:  Procedure Laterality Date  .  ABDOMINAL HYSTERECTOMY  1985  . BREAST LUMPECTOMY Left 2000   radiation and chemo  . BREAST LUMPECTOMY Right 2016   radiation  . BREAST SURGERY  2001   lt breast lumpectomy  . RADIOACTIVE SEED GUIDED PARTIAL MASTECTOMY WITH AXILLARY SENTINEL LYMPH NODE BIOPSY Right 07/07/2015   Procedure: RIGHT RADIOACTIVE SEED GUIDED PARTIAL MASTECTOMY WITH AXILLARY SENTINEL LYMPH NODE BIOPSY;  Surgeon: Autumn Messing III, MD;  Location: [place];  Service: General;  Laterality: Right;  . SMALL INTESTINE SURGERY     . TUBAL LIGATION      Family History  Problem Relation Age of Onset  . Diabetes Father   . Lung cancer Sister        dx. <50; former smoker  . Diabetes Brother   . Diabetes Paternal Aunt   . Stroke Maternal Grandmother   . Diabetes Paternal Grandmother   . Emphysema Mother 87       smoker  . Diabetes Brother   . Brain cancer Brother 53       unknown tumor type  . Cancer Daughter 66       neck cancer  . Other Daughter        hysterectomy for unspecified reason  . Breast cancer Cousin   . Cancer Cousin        unspecified type  . Breast cancer Other        triple negative breast cancer in her 46s  . Cancer Daughter   . Colon polyps Neg Hx   . Esophageal cancer Neg Hx   . Gallbladder disease Neg Hx     Social History   Socioeconomic History  . Marital status: Divorced    Spouse name: Not on file  . Number of children: 7  . Years of education: Not on file  . Highest education level: Not on file  Occupational History  . Occupation: retired  Tobacco Use  . Smoking status: Former Smoker    Packs/day: 1.00    Years: 20.00    Pack years: 20.00    Types: Cigarettes    Quit date: 07/30/2011    Years since quitting: 8.6  . Smokeless tobacco: Never Used  . Tobacco comment: Quit >4 years ago; 1 ppd for about 5/20 years (remaining was less)  Vaping Use  . Vaping Use: Former  Substance and Sexual Activity  . Alcohol use: No    Alcohol/week: 0.0 standard drinks  . Drug use: No  . Sexual activity: Not Currently  Other Topics Concern  . Not on file  Social History Narrative   Lives alone.  Retired.  Education:  11th grade GED.  Children:  7 (one here).    Social Determinants of Health   Financial Resource Strain:   . Difficulty of Paying Living Expenses: Not on file  Food Insecurity: No Food Insecurity  . Worried About Charity fundraiser in the Last Year: Never true  . Ran Out of Food in the Last Year: Never true  Transportation Needs: No Transportation Needs  .  Lack of Transportation (Medical): No  . Lack of Transportation (Non-Medical): No  Physical Activity:   . Days of Exercise per Week: Not on file  . Minutes of Exercise per Session: Not on file  Stress:   . Feeling of Stress : Not on file  Social Connections:   . Frequency of Communication with Friends and Family: Not on file  . Frequency of Social Gatherings with Friends and Family: Not on  file  . Attends Religious Services: Not on file  . Active Member of Clubs or Organizations: Not on file  . Attends Archivist Meetings: Not on file  . Marital Status: Not on file  Intimate Partner Violence:   . Fear of Current or Ex-Partner: Not on file  . Emotionally Abused: Not on file  . Physically Abused: Not on file  . Sexually Abused: Not on file    Past Medical History, Surgical history, Social history, and Family history were reviewed and updated as appropriate.   Please see review of systems for further details on the patient's review from today.   Review of Systems:  Review of Systems  Constitutional: Negative for chills, diaphoresis, fatigue and fever.  HENT: Positive for congestion, postnasal drip, sinus pressure and sinus pain. Negative for facial swelling, rhinorrhea and sore throat.   Respiratory: Positive for cough. Negative for shortness of breath.   Cardiovascular: Negative for chest pain and palpitations.  Gastrointestinal: Negative for nausea and vomiting.  Neurological: Positive for headaches. Negative for dizziness.    Objective:   Physical Exam:  There were no vitals taken for this visit. ECOG: 1  Physical Exam Constitutional:      General: She is not in acute distress.    Appearance: She is not diaphoretic.  HENT:     Head: Normocephalic and atraumatic.     Mouth/Throat:     Pharynx: No oropharyngeal exudate.  Eyes:     General: No scleral icterus.       Right eye: No discharge.        Left eye: No discharge.     Conjunctiva/sclera: Conjunctivae  normal.  Cardiovascular:     Rate and Rhythm: Normal rate and regular rhythm.     Heart sounds: Normal heart sounds. No murmur heard.  No friction rub. No gallop.   Pulmonary:     Effort: Pulmonary effort is normal. No respiratory distress.     Breath sounds: Normal breath sounds. No wheezing or rales.  Skin:    General: Skin is warm and dry.     Findings: No erythema or rash.  Neurological:     Mental Status: She is alert.     Coordination: Coordination normal.  Psychiatric:        Behavior: Behavior normal.        Thought Content: Thought content normal.        Judgment: Judgment normal.     Lab Review:     Component Value Date/Time   NA 136 03/10/2020 1128   NA 139 07/13/2015 1516   K 3.8 03/10/2020 1128   K 3.9 07/13/2015 1516   CL 109 03/10/2020 1128   CO2 20 (L) 03/10/2020 1128   CO2 25 07/13/2015 1516   GLUCOSE 182 (H) 03/10/2020 1128   GLUCOSE 85 07/13/2015 1516   BUN 10 03/10/2020 1128   BUN 7.9 07/13/2015 1516   CREATININE 1.00 03/10/2020 1128   CREATININE 0.9 07/13/2015 1516   CALCIUM 8.4 (L) 03/10/2020 1128   CALCIUM 8.9 07/13/2015 1516   PROT 8.4 (H) 03/10/2020 1128   PROT 7.6 07/13/2015 1516   ALBUMIN 2.7 (L) 03/10/2020 1128   ALBUMIN 3.3 (L) 07/13/2015 1516   AST 9 (L) 03/10/2020 1128   AST 16 07/13/2015 1516   ALT 10 03/10/2020 1128   ALT 12 07/13/2015 1516   ALKPHOS 96 03/10/2020 1128   ALKPHOS 99 07/13/2015 1516   BILITOT <0.2 (L) 03/10/2020 1128   BILITOT <0.30 07/13/2015  [place] (L) 03/10/2020 1128   GFRNONAA 61 05/10/2014 1147   GFRAA >60 03/10/2020 1128   GFRAA 70 05/10/2014 1147       Component Value Date/Time   WBC 4.4 03/10/2020 1128   WBC 6.1 02/18/2020 1044   RBC 3.10 (L) 03/10/2020 1128   HGB 9.6 (L) 03/10/2020 1128   HGB 9.7 (L) 07/13/2015 1516   HCT 29.8 (L) 03/10/2020 1128   HCT 30.2 (L) 07/13/2015 1516   PLT 203 03/10/2020 1128   PLT 317 07/13/2015 1516   MCV 96.1 03/10/2020 1128   MCV 86.8 07/13/2015 1516    MCH 31.0 03/10/2020 1128   MCHC 32.2 03/10/2020 1128   RDW 14.6 03/10/2020 1128   RDW 14.5 07/13/2015 1516   LYMPHSABS 1.1 03/10/2020 1128   LYMPHSABS 3.7 (H) 07/13/2015 1516   MONOABS 0.2 03/10/2020 1128   MONOABS 0.5 07/13/2015 1516   EOSABS 0.0 03/10/2020 1128   EOSABS 0.1 07/13/2015 1516   BASOSABS 0.0 03/10/2020 1128   BASOSABS 0.0 07/13/2015 1516   -------------------------------  Imaging from last 24 hours (if applicable):  Radiology interpretation: No results found.

## 2020-03-23 ENCOUNTER — Telehealth: Payer: Self-pay

## 2020-03-23 ENCOUNTER — Other Ambulatory Visit: Payer: Self-pay | Admitting: *Deleted

## 2020-03-23 DIAGNOSIS — C9000 Multiple myeloma not having achieved remission: Secondary | ICD-10-CM

## 2020-03-24 ENCOUNTER — Inpatient Hospital Stay: Payer: Medicare Other | Admitting: Hematology

## 2020-03-24 ENCOUNTER — Inpatient Hospital Stay: Payer: Medicare Other

## 2020-03-24 ENCOUNTER — Other Ambulatory Visit: Payer: Self-pay

## 2020-03-24 VITALS — BP 165/66 | HR 78 | Temp 99.1°F | Resp 18

## 2020-03-24 DIAGNOSIS — N189 Chronic kidney disease, unspecified: Secondary | ICD-10-CM | POA: Diagnosis not present

## 2020-03-24 DIAGNOSIS — Z5112 Encounter for antineoplastic immunotherapy: Secondary | ICD-10-CM | POA: Diagnosis not present

## 2020-03-24 DIAGNOSIS — Z8673 Personal history of transient ischemic attack (TIA), and cerebral infarction without residual deficits: Secondary | ICD-10-CM | POA: Diagnosis not present

## 2020-03-24 DIAGNOSIS — E1140 Type 2 diabetes mellitus with diabetic neuropathy, unspecified: Secondary | ICD-10-CM | POA: Diagnosis not present

## 2020-03-24 DIAGNOSIS — Z923 Personal history of irradiation: Secondary | ICD-10-CM | POA: Diagnosis not present

## 2020-03-24 DIAGNOSIS — C9000 Multiple myeloma not having achieved remission: Secondary | ICD-10-CM

## 2020-03-24 DIAGNOSIS — Z87891 Personal history of nicotine dependence: Secondary | ICD-10-CM | POA: Diagnosis not present

## 2020-03-24 DIAGNOSIS — Z9221 Personal history of antineoplastic chemotherapy: Secondary | ICD-10-CM | POA: Diagnosis not present

## 2020-03-24 DIAGNOSIS — Z7189 Other specified counseling: Secondary | ICD-10-CM

## 2020-03-24 DIAGNOSIS — I252 Old myocardial infarction: Secondary | ICD-10-CM | POA: Diagnosis not present

## 2020-03-24 DIAGNOSIS — Z853 Personal history of malignant neoplasm of breast: Secondary | ICD-10-CM | POA: Diagnosis not present

## 2020-03-24 DIAGNOSIS — E1122 Type 2 diabetes mellitus with diabetic chronic kidney disease: Secondary | ICD-10-CM | POA: Diagnosis not present

## 2020-03-24 LAB — CMP (CANCER CENTER ONLY)
ALT: 14 U/L (ref 0–44)
AST: 14 U/L — ABNORMAL LOW (ref 15–41)
Albumin: 3 g/dL — ABNORMAL LOW (ref 3.5–5.0)
Alkaline Phosphatase: 85 U/L (ref 38–126)
Anion gap: 7 (ref 5–15)
BUN: 9 mg/dL (ref 8–23)
CO2: 23 mmol/L (ref 22–32)
Calcium: 8.3 mg/dL — ABNORMAL LOW (ref 8.9–10.3)
Chloride: 106 mmol/L (ref 98–111)
Creatinine: 0.93 mg/dL (ref 0.44–1.00)
GFR, Est AFR Am: >60 mL/min (ref >60)
GFR, Estimated: >60 mL/min (ref >60)
Glucose, Bld: 113 mg/dL — ABNORMAL HIGH (ref 70–99)
Potassium: 3.5 mmol/L (ref 3.5–5.1)
Sodium: 136 mmol/L (ref 135–145)
Total Bilirubin: <0.1 mg/dL — ABNORMAL LOW (ref 0.3–1.2)
Total Protein: 8.2 g/dL — ABNORMAL HIGH (ref 6.5–8.1)

## 2020-03-24 LAB — CBC WITH DIFFERENTIAL (CANCER CENTER ONLY)
Abs Immature Granulocytes: 0.02 10*3/uL (ref 0.00–0.07)
Basophils Absolute: 0 10*3/uL (ref 0.0–0.1)
Basophils Relative: 0 %
Eosinophils Absolute: 0 10*3/uL (ref 0.0–0.5)
Eosinophils Relative: 0 %
HCT: 27.2 % — ABNORMAL LOW (ref 36.0–46.0)
Hemoglobin: 9 g/dL — ABNORMAL LOW (ref 12.0–15.0)
Immature Granulocytes: 0 %
Lymphocytes Relative: 49 %
Lymphs Abs: 2.3 10*3/uL (ref 0.7–4.0)
MCH: 31 pg (ref 26.0–34.0)
MCHC: 33.1 g/dL (ref 30.0–36.0)
MCV: 93.8 fL (ref 80.0–100.0)
Monocytes Absolute: 0.3 10*3/uL (ref 0.1–1.0)
Monocytes Relative: 6 %
Neutro Abs: 2.2 10*3/uL (ref 1.7–7.7)
Neutrophils Relative %: 45 %
Platelet Count: 192 10*3/uL (ref 150–400)
RBC: 2.9 MIL/uL — ABNORMAL LOW (ref 3.87–5.11)
RDW: 15 % (ref 11.5–15.5)
WBC Count: 4.9 10*3/uL (ref 4.0–10.5)
nRBC: 0 % (ref 0.0–0.2)

## 2020-03-24 LAB — SAMPLE TO BLOOD BANK

## 2020-03-24 MED ORDER — SODIUM CHLORIDE 0.9 % IV SOLN
16.0000 mg/kg | Freq: Once | INTRAVENOUS | Status: AC
  Administered 2020-03-24: 1400 mg via INTRAVENOUS
  Filled 2020-03-24: qty 60

## 2020-03-24 MED ORDER — ERGOCALCIFEROL 1.25 MG (50000 UT) PO CAPS: 50000.0000 [IU] | ORAL_CAPSULE | ORAL | 2 refills | Status: DC

## 2020-03-24 MED ORDER — ACETAMINOPHEN 325 MG PO TABS
650.0000 mg | ORAL_TABLET | Freq: Once | ORAL | Status: AC
  Administered 2020-03-24: 650 mg via ORAL

## 2020-03-24 MED ORDER — SODIUM CHLORIDE 0.9 % IV SOLN
20.0000 mg | Freq: Once | INTRAVENOUS | Status: AC
  Administered 2020-03-24: 20 mg via INTRAVENOUS
  Filled 2020-03-24: qty 20

## 2020-03-24 MED ORDER — MONTELUKAST SODIUM 10 MG PO TABS
10.0000 mg | ORAL_TABLET | Freq: Once | ORAL | Status: AC
  Administered 2020-03-24: 10 mg via ORAL

## 2020-03-24 MED ORDER — ACETAMINOPHEN 325 MG PO TABS
ORAL_TABLET | ORAL | Status: AC
  Filled 2020-03-24: qty 2

## 2020-03-24 MED ORDER — SODIUM CHLORIDE 0.9 % IV SOLN
Freq: Once | INTRAVENOUS | Status: AC
  Filled 2020-03-24: qty 250

## 2020-03-24 MED ORDER — FAMOTIDINE IN NACL 20-0.9 MG/50ML-% IV SOLN
INTRAVENOUS | Status: AC
  Filled 2020-03-24: qty 50

## 2020-03-24 MED ORDER — DIPHENHYDRAMINE HCL 25 MG PO CAPS
50.0000 mg | ORAL_CAPSULE | Freq: Once | ORAL | Status: AC
  Administered 2020-03-24: 50 mg via ORAL

## 2020-03-24 MED ORDER — FAMOTIDINE IN NACL 20-0.9 MG/50ML-% IV SOLN
20.0000 mg | Freq: Once | INTRAVENOUS | Status: AC
  Administered 2020-03-24: 20 mg via INTRAVENOUS

## 2020-03-24 MED ORDER — DIPHENHYDRAMINE HCL 25 MG PO CAPS
ORAL_CAPSULE | ORAL | Status: AC
  Filled 2020-03-24: qty 2

## 2020-03-24 MED ORDER — MONTELUKAST SODIUM 10 MG PO TABS
ORAL_TABLET | ORAL | Status: AC
  Filled 2020-03-24: qty 1

## 2020-03-24 NOTE — Progress Notes (Signed)
HEMATOLOGY/ONCOLOGY CLINIC NOTE  Date of Service: 03/24/2020  Patient Care Team: [name], MD as PCP - General (Family Medicine) [name], MD as Attending Physician (Physical Medicine and Rehabilitation) [name], MD as Consulting Physician (Orthopedic Surgery) [name], MD as Consulting Physician (Otolaryngology) [name], MD as Consulting Physician (Gastroenterology) [name], MD as Consulting Physician (General Surgery) [name], MD as Consulting Physician (Hematology and Oncology) [name], MD as Consulting Physician (Radiation Oncology) [name], NP as Nurse Practitioner (Hematology and Oncology) [name], [facility] as Pharmacist (Pharmacist)  CHIEF COMPLAINTS/PURPOSE OF CONSULTATION:  Multiple Myeloma not having achieved remission  HISTORY OF PRESENTING ILLNESS:  Tracy [name] is a wonderful 74 y.o. female who has been referred to [place] by Dr [name] for evaluation and management of Multiple Myeloma. Pt is accompanied today by her daughter in person and other daughter and granddaughter via phone. The pt reports that she is doing well overall.   When pt was first diagnosed with Breast Cancer it was localized in both of her breasts and was considered Stage 1. She then received chemotherapy. However, she declined chemotherapy a second time and chose radiation therapy after recurrence. Pt has been under the surveillance of Dr. [name] after declining antiestrogen therapy.   The pt reports that she was having difficulty breathing so she saw her Pulmonologist, who ordered the work up. Based on the results of the PET/CT pt was sent to Dr. [name] as they were concerned that her breast cancer had spread. Pt has been becoming increasingly anemic over the last year, despite using a PO Iron supplement. She has had a positive Fecal Occult tests, but has been having issues with hemorrhoids and hemorrhoidal bleeding for the last  5-6 months. Pt denies any blood in her stools but notices blood on the tissue after she wipes. Pt has also been having some discomfort in her left hip and lower back. This pain is worsened when she sits down.   Pt has had a chronic cough that has been thought to either be Bronchitis or a result of acid reflux. Pt is planning to receive an Endoscopy to work this up further. She is currently taking Gabapentin for tingling/burning in her extremities. Pt has Type II Diabetes and was previously using Metformin, but discontinued due to concern for liver damage. She is not currently using any medications to control her Diabetes.   Pt had a heart attack in 2001. She initially though that she had a [employer], until the sensation began to travel up her body. She was given Nitroglycerin upon admittance to the hospital. Pt did not require any stents or other interventions. No cause for her heart attack was ever discovered. Pt has been seen by Neurology, Dr. Leta Baptist, who completed imaging on her neck and found a degeneratve disk. Pt has not had a nerve study conducted. Pt has no history of Shingles and has not received the Shingles vaccine. She has been fully vaccinated against he COVID19 virus.    Of note prior to the patient's visit today, pt has had PET/CT ([id number]) completed on 01/20/2020 with results revealing "1. Hypermetabolism corresponding to left acetabular and L5 lytic lesions, with differential considerations of metastatic disease or myeloma. 2. No evidence of hypermetabolic soft tissue primary, metastatic disease, or soft tissue myeloma. 3. Incidental findings, including: Aortic atherosclerosis (ICD10-I70.0) and emphysema (ICD10-J43.9). Hepatic steatosis."   Pt has had Bone Marrow Surgical Pathology ([id number]) completed on 01/17/2020 which revealed "BONE  MARROW, ASPIRATE, CLOT, CORE: -Hypercellular bone marrow for age with plasma cell neoplasm PERIPHERAL BLOOD: -Normocytic-normochromic  anemia"  Pt has had Left Ischium Biopsy Surgical Pathology Report [id number]) completed on 01/17/2020 which revealed "Plasma cell neoplasm."  Most recent lab results (01/25/2020) of CBC is as follows: all values are WNL except for RBC at 2.44, Hgb at 7.5, HCT at 24.0, CO2 at 21, Glucose at 129, Creatinine at 1.38, Total Protein at 10.1, Albumin at 2.8, Total Bilirubin at <0.2, GFR Est Afr Am at 43. 01/25/2020 K/L light chains is as follows: Kappa free light chain at 22.7, Lamda free light chains at 17.7, K/L light chain ratio at 1.28 01/25/2020 MMP is as follows: all values are WNL except for IgG at 5220, Total Protein at 10.1, Alpha2 Glob at 1.2, Gamma Glob at 3.9, M Protein at 3.7, Total Globulin at 6.7, Albumin/Glob at 0.6. 01/25/2020 24-hr UPEP shows all values are WNL  On review of systems, pt reports lower back/left hip pain, unexpected weight loss, abdominal pain, loose stools and denies low appetite, constipation, rashes and any other symptoms.   On PMHx the pt reports Breast Cancer, Breast Lumpectomy, TIA, Restless leg, Myocardial infarction, HTN, HLD, Chronic Bronchitis.  INTERVAL HISTORY: Tracy [name] is a wonderful 74 y.o. female who is here for evaluation and management of Multiple Myeloma. She is here for C2D15 Daratumumab.  The patient's last visit with [place] was on 03/03/2020. The pt reports that she is doing well overall.  The pt reports that she continues to have an aching pain around her abdomen. She feels a sharp pain if she sits down a certain way and when she stands after sitting for a time. Overall her pain is more dull and intermittent.  Last Friday she saw red discoloration of her toothpaste and believes that she had some dental bleeding. She has not had dental bleeding since then.   Pt was experiencing a significant cough last week and was prescribed Azithromycin and Albuterol for Bronchitis by Sandi Mealy - PA. Pt notes resolved cough and denies any other  infection symptoms.   Pt is about 6 weeks out from her root canal.   Lab results today (03/24/20) of CBC w/diff and CMP is as follows: all values are WNL except for RBC at 2.90, Hgb at 9.0, HCT at 27.2, Glucose at 113, Calcium at 8.3, Total Protein at 8.2, Albumin at 3.0, AST at 14, Total Bilirubin at <0.1.  On review of systems, pt reports abdominal aching and denies gum bleeds, nose bleeds, leg swelling, fevers, chills, cough, low appetite and any other symptoms.    MEDICAL HISTORY:  Past Medical History:  Diagnosis Date  . Anxiety   . Arthritis   . Breast cancer ([place]) 06/13/15  . Cancer ([place]) 2000   breast cancer  . Chronic bronchitis ([place])   . Chronic bronchitis ([place])   . Hyperlipidemia   . Hypertension   . Myocardial infarction ([place]) 2001  . Personal history of radiation therapy   . Restless leg   . Stroke [facility]) 2004   TIA, no deficits    SURGICAL HISTORY: Past Surgical History:  Procedure Laterality Date  . ABDOMINAL HYSTERECTOMY  1985  . BREAST LUMPECTOMY Left 2000   radiation and chemo  . BREAST LUMPECTOMY Right 2016   radiation  . BREAST SURGERY  2001   lt breast lumpectomy  . RADIOACTIVE SEED GUIDED PARTIAL MASTECTOMY WITH AXILLARY SENTINEL LYMPH NODE BIOPSY Right 07/07/2015   Procedure: RIGHT RADIOACTIVE SEED GUIDED  PARTIAL MASTECTOMY WITH AXILLARY SENTINEL LYMPH NODE BIOPSY;  Surgeon: Autumn Messing III, MD;  Location: [place];  Service: General;  Laterality: Right;  . SMALL INTESTINE SURGERY    . TUBAL LIGATION      SOCIAL HISTORY: Social History   Socioeconomic History  . Marital status: Divorced    Spouse name: Not on file  . Number of children: 7  . Years of education: Not on file  . Highest education level: Not on file  Occupational History  . Occupation: retired  Tobacco Use  . Smoking status: Former Smoker    Packs/day: 1.00    Years: 20.00    Pack years: 20.00    Types: Cigarettes    Quit date: 07/30/2011    Years since  quitting: 8.6  . Smokeless tobacco: Never Used  . Tobacco comment: Quit >4 years ago; 1 ppd for about 5/20 years (remaining was less)  Vaping Use  . Vaping Use: Former  Substance and Sexual Activity  . Alcohol use: No    Alcohol/week: 0.0 standard drinks  . Drug use: No  . Sexual activity: Not Currently  Other Topics Concern  . Not on file  Social History Narrative   Lives alone.  Retired.  Education:  11th grade GED.  Children:  7 (one here).    Social Determinants of Health   Financial Resource Strain:   . Difficulty of Paying Living Expenses: Not on file  Food Insecurity: No Food Insecurity  . Worried About Charity fundraiser in the Last Year: Never true  . Ran Out of Food in the Last Year: Never true  Transportation Needs: No Transportation Needs  . Lack of Transportation (Medical): No  . Lack of Transportation (Non-Medical): No  Physical Activity:   . Days of Exercise per Week: Not on file  . Minutes of Exercise per Session: Not on file  Stress:   . Feeling of Stress : Not on file  Social Connections:   . Frequency of Communication with Friends and Family: Not on file  . Frequency of Social Gatherings with Friends and Family: Not on file  . Attends Religious Services: Not on file  . Active Member of Clubs or Organizations: Not on file  . Attends Archivist Meetings: Not on file  . Marital Status: Not on file  Intimate Partner Violence:   . Fear of Current or Ex-Partner: Not on file  . Emotionally Abused: Not on file  . Physically Abused: Not on file  . Sexually Abused: Not on file    FAMILY HISTORY: Family History  Problem Relation Age of Onset  . Diabetes Father   . Lung cancer Sister        dx. <50; former smoker  . Diabetes Brother   . Diabetes Paternal Aunt   . Stroke Maternal Grandmother   . Diabetes Paternal Grandmother   . Emphysema Mother 18       smoker  . Diabetes Brother   . Brain cancer Brother 62       unknown tumor type  .  Cancer Daughter 71       neck cancer  . Other Daughter        hysterectomy for unspecified reason  . Breast cancer Cousin   . Cancer Cousin        unspecified type  . Breast cancer Other        triple negative breast cancer in her 22s  . Cancer Daughter   .  Colon polyps Neg Hx   . Esophageal cancer Neg Hx   . Gallbladder disease Neg Hx     ALLERGIES:  is allergic to bacitracin-neomycin-polymyxin  [neomycin-bacitracin zn-polymyx], nsaids, tape, ambien [zolpidem tartrate], amoxicillin, contrast media [iodinated diagnostic agents], latex, and prednisone.  MEDICATIONS:  Current Outpatient Medications  Medication Sig Dispense Refill  . Accu-Chek Softclix Lancets lancets Use as instructed to check sugars 1-2 times daily. 100 each 12  . acyclovir (ZOVIRAX) 400 MG tablet Take 1 tablet (400 mg total) by mouth 2 (two) times daily. 60 tablet 11  . albuterol (VENTOLIN HFA) 108 (90 Base) MCG/ACT inhaler Inhale 2 puffs into the lungs every 6 (six) hours as needed for wheezing or shortness of breath. 8 g 2  . aspirin 81 MG tablet Take 81 mg by mouth daily.    Marland Kitchen atorvastatin (LIPITOR) 10 MG tablet TAKE 1 TABLET BY MOUTH  DAILY 90 tablet 3  . azithromycin (ZITHROMAX Z-PAK) 250 MG tablet 2 tablets day 1 and then 1 tablet days 2 through 5 6 each 0  . dexamethasone (DECADRON) 4 MG tablet Take 3 tabs with breakfast the day after each treatment. 20 tablet 4  . ferrous sulfate 325 (65 FE) MG tablet Take 1 tablet (325 mg total) by mouth daily with breakfast. 30 tablet 6  . fluticasone (FLOVENT HFA) 110 MCG/ACT inhaler Inhale 2 puffs into the lungs in the morning and at bedtime. 1 each 12  . gabapentin (NEURONTIN) 300 MG capsule TAKE 1 CAPSULE BY MOUTH 3  TIMES DAILY 270 capsule 3  . glucose blood (ACCU-CHEK GUIDE) test strip Use as instructed to check sugars 1-2 times daily. 100 each 12  . HYDROcodone-acetaminophen (NORCO) 5-325 MG tablet Take 1 tablet by mouth every 6 (six) hours as needed for moderate pain  or severe pain. 60 tablet 0  . loratadine (CLARITIN) 10 MG tablet Take 10 mg by mouth daily.    Marland Kitchen LORazepam (ATIVAN) 0.5 MG tablet Take 1 tablet (0.5 mg total) by mouth every 6 (six) hours as needed (Nausea or vomiting). 30 tablet 0  . losartan (COZAAR) 100 MG tablet TAKE 1 TABLET BY MOUTH  DAILY 90 tablet 3  . MELATONIN PO Take by mouth.     . mometasone (NASONEX) 50 MCG/ACT nasal spray Place 2 sprays into the nose daily. 17 g 12  . ondansetron (ZOFRAN) 8 MG tablet Take 1 tablet (8 mg total) by mouth 2 (two) times daily as needed (Nausea or vomiting). 30 tablet 1  . pantoprazole (PROTONIX) 40 MG tablet TAKE 1 TABLET(40 MG) BY MOUTH TWICE DAILY 60 tablet 3  . prochlorperazine (COMPAZINE) 10 MG tablet Take 1 tablet (10 mg total) by mouth every 6 (six) hours as needed (Nausea or vomiting). 30 tablet 1  . sertraline (ZOLOFT) 50 MG tablet TAKE 1 TABLET(50 MG) BY MOUTH DAILY (Patient taking differently: Take 25 mg by mouth daily. TAKE 1 TABLET(50 MG) BY MOUTH DAILY) 90 tablet 0  . tiZANidine (ZANAFLEX) 4 MG tablet TAKE 1 TABLET(4 MG) BY MOUTH AT BEDTIME 30 tablet 3  . traZODone (DESYREL) 100 MG tablet TAKE 1 TABLET BY MOUTH AT  BEDTIME 90 tablet 3   No current facility-administered medications for this visit.    REVIEW OF SYSTEMS:   A 10+ POINT REVIEW OF SYSTEMS WAS OBTAINED including neurology, dermatology, psychiatry, cardiac, respiratory, lymph, extremities, GI, GU, Musculoskeletal, constitutional, breasts, reproductive, HEENT.  All pertinent positives are noted in the HPI.  All others are negative.   PHYSICAL EXAMINATION:  ECOG PERFORMANCE STATUS: 2 - Symptomatic, <50% confined to bed  . There were no vitals filed for this visit. There were no vitals filed for this visit. .There is no height or weight on file to calculate BMI.  Exam was given in a chair   GENERAL:alert, in no acute distress and comfortable SKIN: no acute rashes, no significant lesions EYES: conjunctiva are pink and  non-injected, sclera anicteric OROPHARYNX: MMM, no exudates, no oropharyngeal erythema or ulceration NECK: supple, no JVD LYMPH:  no palpable lymphadenopathy in the cervical, axillary or inguinal regions LUNGS: clear to auscultation b/l with normal respiratory effort HEART: regular rate & rhythm ABDOMEN:  normoactive bowel sounds , non tender, not distended. No palpable hepatosplenomegaly.  Extremity: no pedal edema PSYCH: alert & oriented x 3 with fluent speech NEURO: no focal motor/sensory deficits  LABORATORY DATA:  I have reviewed the data as listed  . CBC Latest Ref Rng & Units 03/10/2020 03/03/2020 02/25/2020  WBC 4.0 - 10.5 K/uL 4.4 4.9 5.1  Hemoglobin 12.0 - 15.0 g/dL 9.6(L) 10.4(L) 9.8(L)  Hematocrit 36 - 46 % 29.8(L) 32.7(L) 30.0(L)  Platelets 150 - 400 K/uL 203 299 332    . CMP Latest Ref Rng & Units 03/10/2020 03/03/2020 02/25/2020  Glucose 70 - 99 mg/dL 182(H) 157(H) 117(H)  BUN 8 - 23 mg/dL '10 16 9  ' Creatinine 0.44 - 1.00 mg/dL 1.00 1.22(H) 1.14(H)  Sodium 135 - 145 mmol/L 136 136 136  Potassium 3.5 - 5.1 mmol/L 3.8 3.8 3.5  Chloride 98 - 111 mmol/L 109 109 108  CO2 22 - 32 mmol/L 20(L) 21(L) 21(L)  Calcium 8.9 - 10.3 mg/dL 8.4(L) 8.6(L) 8.7(L)  Total Protein 6.5 - 8.1 g/dL 8.4(H) 9.7(H) 10.0(H)  Total Bilirubin 0.3 - 1.2 mg/dL <0.2(L) 0.2(L) 0.3  Alkaline Phos 38 - 126 U/L 96 102 96  AST 15 - 41 U/L 9(L) 10(L) 12(L)  ALT 0 - 44 U/L '10 11 13   ' 01/25/2020 K/L light chains:    01/25/2020 MMP:            RADIOGRAPHIC STUDIES: I have personally reviewed the radiological images as listed and agreed with the findings in the report. No results found.  ASSESSMENT & PLAN:   74 yo with   1) Newly diagnosed IgG Kappa Multiple myeloma with bone lesions, anemia, renal insuff. M spike @ 3.7g/dl 1p deletion, polymorphic variant, 13q deletion 2) h/o Dm2 3) Diabetic Neuropathy 4) CKD - likely from DM2, but could have an element of myeloma kidney. 5) h/o TIA and  AMI 6) Iron deficiency    PLAN: -Discussed pt labwork today, 03/24/20; Hgb is steady, WBC & PLT are nml, blood chemistries look good, Creatinine has normalized. -The pt has no prohibitive toxicities from continuing [place] necessitating treatment change at this time. -Will consider adding in an additional medication for increased response depending on pt's response after C2.  -Normalization of renal function suggests that Multiple Myeloma was a factor in her kidney dysfunction -Advised pt that it may take a month after RT to see the full benefits.  -Advised pt that the pain should continue to improve during treatment and as the RT begins to take effect.  -Plan to begin Zometa with C3. Advised pt that it is important to intake enough Vitamin D with this medication.  -Recommended that the pt continue to eat well, drink at least 48-64 oz of water each day, and walk 20-30 minutes each day.  -Will send a prescription for a medical  lift assist device. -Rx Ergocalciferol (weekly) -Will see back in 2 weeks with labs   FOLLOW UP: Plz schedule C3 and C4 of Daratumumab (q2weeks) as ordered with portflush and labs Starting Zometa in 2 weeks with C3D1 (will be q4weeks)- plz schedule x 6 Next MD visit in 2 weeks with C3D1   The total time spent in the appt was 20 minutes and more than 50% was on counseling and direct patient cares.  All of the patient's questions were answered with apparent satisfaction. The patient knows to call the clinic with any problems, questions or concerns.  [name] MD [place] AAHIVMS [facility] Hematology/Oncology Physician [facility]  (Office):       [phone] (Work cell):  [phone] (Fax):           [phone]  03/24/2020 9:14 AM  I, Yevette Edwards, am acting as a scribe for Dr. [name].   .I have reviewed the above documentation for accuracy and completeness, and I agree with the above. [name] MD

## 2020-03-27 ENCOUNTER — Telehealth: Payer: Self-pay | Admitting: Family Medicine

## 2020-03-27 NOTE — Telephone Encounter (Signed)
Patient called today - Just FYI - Her cancer doctor is going to fax Dr. Birdie Riddle  A form for certification for a lift chair - she doesn't know when it will be sent

## 2020-03-27 NOTE — Telephone Encounter (Signed)
I will complete this once it is received

## 2020-03-27 NOTE — Telephone Encounter (Signed)
FYI

## 2020-03-29 ENCOUNTER — Other Ambulatory Visit: Payer: Self-pay | Admitting: General Practice

## 2020-03-29 ENCOUNTER — Inpatient Hospital Stay: Payer: Medicare Other | Attending: Hematology

## 2020-03-29 ENCOUNTER — Other Ambulatory Visit: Payer: Self-pay

## 2020-03-29 ENCOUNTER — Inpatient Hospital Stay: Payer: Medicare Other | Admitting: Hematology

## 2020-03-29 DIAGNOSIS — IMO0002 Reserved for concepts with insufficient information to code with codable children: Secondary | ICD-10-CM

## 2020-03-29 DIAGNOSIS — Z5112 Encounter for antineoplastic immunotherapy: Secondary | ICD-10-CM | POA: Diagnosis not present

## 2020-03-29 DIAGNOSIS — C9000 Multiple myeloma not having achieved remission: Secondary | ICD-10-CM | POA: Insufficient documentation

## 2020-03-29 DIAGNOSIS — E1140 Type 2 diabetes mellitus with diabetic neuropathy, unspecified: Secondary | ICD-10-CM | POA: Diagnosis not present

## 2020-03-29 DIAGNOSIS — N189 Chronic kidney disease, unspecified: Secondary | ICD-10-CM | POA: Diagnosis not present

## 2020-03-29 DIAGNOSIS — Z8673 Personal history of transient ischemic attack (TIA), and cerebral infarction without residual deficits: Secondary | ICD-10-CM | POA: Insufficient documentation

## 2020-03-29 DIAGNOSIS — Z853 Personal history of malignant neoplasm of breast: Secondary | ICD-10-CM | POA: Insufficient documentation

## 2020-03-29 DIAGNOSIS — I252 Old myocardial infarction: Secondary | ICD-10-CM | POA: Diagnosis not present

## 2020-03-29 DIAGNOSIS — Z87891 Personal history of nicotine dependence: Secondary | ICD-10-CM | POA: Diagnosis not present

## 2020-03-29 DIAGNOSIS — Z923 Personal history of irradiation: Secondary | ICD-10-CM | POA: Diagnosis not present

## 2020-03-29 DIAGNOSIS — E1122 Type 2 diabetes mellitus with diabetic chronic kidney disease: Secondary | ICD-10-CM | POA: Diagnosis not present

## 2020-03-29 DIAGNOSIS — R29898 Other symptoms and signs involving the musculoskeletal system: Secondary | ICD-10-CM

## 2020-03-29 LAB — CMP (CANCER CENTER ONLY)
ALT: 10 U/L (ref 0–44)
AST: 8 U/L — ABNORMAL LOW (ref 15–41)
Albumin: 2.8 g/dL — ABNORMAL LOW (ref 3.5–5.0)
Alkaline Phosphatase: 98 U/L (ref 38–126)
Anion gap: 9 (ref 5–15)
BUN: 15 mg/dL (ref 8–23)
CO2: 25 mmol/L (ref 22–32)
Calcium: 9.3 mg/dL (ref 8.9–10.3)
Chloride: 105 mmol/L (ref 98–111)
Creatinine: 1.04 mg/dL — ABNORMAL HIGH (ref 0.44–1.00)
GFR, Est AFR Am: >60 mL/min (ref >60)
GFR, Estimated: 52 mL/min — ABNORMAL LOW (ref >60)
Glucose, Bld: 139 mg/dL — ABNORMAL HIGH (ref 70–99)
Potassium: 3.8 mmol/L (ref 3.5–5.1)
Sodium: 139 mmol/L (ref 135–145)
Total Bilirubin: 0.3 mg/dL (ref 0.3–1.2)
Total Protein: 8.2 g/dL — ABNORMAL HIGH (ref 6.5–8.1)

## 2020-03-29 LAB — CBC WITH DIFFERENTIAL/PLATELET
Abs Immature Granulocytes: 0.01 10*3/uL (ref 0.00–0.07)
Basophils Absolute: 0 10*3/uL (ref 0.0–0.1)
Basophils Relative: 0 %
Eosinophils Absolute: 0 10*3/uL (ref 0.0–0.5)
Eosinophils Relative: 0 %
HCT: 30.4 % — ABNORMAL LOW (ref 36.0–46.0)
Hemoglobin: 9.9 g/dL — ABNORMAL LOW (ref 12.0–15.0)
Immature Granulocytes: 0 %
Lymphocytes Relative: 42 %
Lymphs Abs: 2.1 10*3/uL (ref 0.7–4.0)
MCH: 30.6 pg (ref 26.0–34.0)
MCHC: 32.6 g/dL (ref 30.0–36.0)
MCV: 93.8 fL (ref 80.0–100.0)
Monocytes Absolute: 0.3 10*3/uL (ref 0.1–1.0)
Monocytes Relative: 5 %
Neutro Abs: 2.6 10*3/uL (ref 1.7–7.7)
Neutrophils Relative %: 53 %
Platelets: 233 10*3/uL (ref 150–400)
RBC: 3.24 MIL/uL — ABNORMAL LOW (ref 3.87–5.11)
RDW: 15.2 % (ref 11.5–15.5)
WBC: 4.9 10*3/uL (ref 4.0–10.5)
nRBC: 0 % (ref 0.0–0.2)

## 2020-03-29 LAB — LACTATE DEHYDROGENASE: LDH: 155 U/L (ref 98–192)

## 2020-03-29 NOTE — Telephone Encounter (Signed)
DME for lift Chair placed today.

## 2020-03-30 ENCOUNTER — Inpatient Hospital Stay: Payer: Medicare Other

## 2020-03-30 ENCOUNTER — Other Ambulatory Visit: Payer: Self-pay

## 2020-03-30 VITALS — BP 153/58 | HR 75 | Temp 99.6°F | Resp 18

## 2020-03-30 DIAGNOSIS — Z87891 Personal history of nicotine dependence: Secondary | ICD-10-CM | POA: Diagnosis not present

## 2020-03-30 DIAGNOSIS — C9000 Multiple myeloma not having achieved remission: Secondary | ICD-10-CM | POA: Diagnosis not present

## 2020-03-30 DIAGNOSIS — Z853 Personal history of malignant neoplasm of breast: Secondary | ICD-10-CM | POA: Diagnosis not present

## 2020-03-30 DIAGNOSIS — Z8673 Personal history of transient ischemic attack (TIA), and cerebral infarction without residual deficits: Secondary | ICD-10-CM | POA: Diagnosis not present

## 2020-03-30 DIAGNOSIS — N189 Chronic kidney disease, unspecified: Secondary | ICD-10-CM | POA: Diagnosis not present

## 2020-03-30 DIAGNOSIS — I252 Old myocardial infarction: Secondary | ICD-10-CM | POA: Diagnosis not present

## 2020-03-30 DIAGNOSIS — E1140 Type 2 diabetes mellitus with diabetic neuropathy, unspecified: Secondary | ICD-10-CM | POA: Diagnosis not present

## 2020-03-30 DIAGNOSIS — E1122 Type 2 diabetes mellitus with diabetic chronic kidney disease: Secondary | ICD-10-CM | POA: Diagnosis not present

## 2020-03-30 DIAGNOSIS — Z7189 Other specified counseling: Secondary | ICD-10-CM

## 2020-03-30 DIAGNOSIS — Z923 Personal history of irradiation: Secondary | ICD-10-CM | POA: Diagnosis not present

## 2020-03-30 DIAGNOSIS — Z5112 Encounter for antineoplastic immunotherapy: Secondary | ICD-10-CM | POA: Diagnosis not present

## 2020-03-30 MED ORDER — FAMOTIDINE IN NACL 20-0.9 MG/50ML-% IV SOLN
INTRAVENOUS | Status: AC
  Filled 2020-03-30: qty 50

## 2020-03-30 MED ORDER — SODIUM CHLORIDE 0.9 % IV SOLN
20.0000 mg | Freq: Once | INTRAVENOUS | Status: AC
  Administered 2020-03-30: 20 mg via INTRAVENOUS
  Filled 2020-03-30: qty 20

## 2020-03-30 MED ORDER — SODIUM CHLORIDE 0.9 % IV SOLN
Freq: Once | INTRAVENOUS | Status: AC
  Filled 2020-03-30: qty 250

## 2020-03-30 MED ORDER — ACETAMINOPHEN 325 MG PO TABS
ORAL_TABLET | ORAL | Status: AC
  Filled 2020-03-30: qty 2

## 2020-03-30 MED ORDER — ACETAMINOPHEN 325 MG PO TABS
650.0000 mg | ORAL_TABLET | Freq: Once | ORAL | Status: AC
  Administered 2020-03-30: 650 mg via ORAL

## 2020-03-30 MED ORDER — MONTELUKAST SODIUM 10 MG PO TABS
10.0000 mg | ORAL_TABLET | Freq: Once | ORAL | Status: AC
  Administered 2020-03-30: 10 mg via ORAL

## 2020-03-30 MED ORDER — FAMOTIDINE IN NACL 20-0.9 MG/50ML-% IV SOLN
20.0000 mg | Freq: Once | INTRAVENOUS | Status: AC
  Administered 2020-03-30: 20 mg via INTRAVENOUS

## 2020-03-30 MED ORDER — DIPHENHYDRAMINE HCL 25 MG PO CAPS
50.0000 mg | ORAL_CAPSULE | Freq: Once | ORAL | Status: AC
  Administered 2020-03-30: 50 mg via ORAL

## 2020-03-30 MED ORDER — DIPHENHYDRAMINE HCL 25 MG PO CAPS
ORAL_CAPSULE | ORAL | Status: AC
  Filled 2020-03-30: qty 2

## 2020-03-30 MED ORDER — MONTELUKAST SODIUM 10 MG PO TABS
ORAL_TABLET | ORAL | Status: AC
  Filled 2020-03-30: qty 1

## 2020-03-30 MED ORDER — SODIUM CHLORIDE 0.9 % IV SOLN
16.0000 mg/kg | Freq: Once | INTRAVENOUS | Status: AC
  Administered 2020-03-30: 1400 mg via INTRAVENOUS
  Filled 2020-03-30: qty 60

## 2020-03-30 NOTE — Telephone Encounter (Signed)
Error

## 2020-03-30 NOTE — Patient Instructions (Signed)
[facility] Discharge Instructions for Patients Receiving Chemotherapy  Today you received the following chemotherapy agents: daratumumab.  To help prevent nausea and vomiting after your treatment, we encourage you to take your nausea medication as directed.   If you develop nausea and vomiting that is not controlled by your nausea medication, call the clinic.   BELOW ARE SYMPTOMS THAT SHOULD BE REPORTED IMMEDIATELY:  *FEVER GREATER THAN 100.5 F  *CHILLS WITH OR WITHOUT FEVER  NAUSEA AND VOMITING THAT IS NOT CONTROLLED WITH YOUR NAUSEA MEDICATION  *UNUSUAL SHORTNESS OF BREATH  *UNUSUAL BRUISING OR BLEEDING  TENDERNESS IN MOUTH AND THROAT WITH OR WITHOUT PRESENCE OF ULCERS  *URINARY PROBLEMS  *BOWEL PROBLEMS  UNUSUAL RASH Items with * indicate a potential emergency and should be followed up as soon as possible.  Feel free to call the clinic should you have any questions or concerns. The clinic phone number is [phone].  Please show the CHEMO ALERT CARD at check-in to the Emergency Department and triage nurse.   

## 2020-03-31 ENCOUNTER — Telehealth: Payer: Self-pay | Admitting: Hematology

## 2020-03-31 ENCOUNTER — Other Ambulatory Visit: Payer: Self-pay | Admitting: Hematology and Oncology

## 2020-03-31 NOTE — Telephone Encounter (Signed)
Scheduled per 08/27 los, patient has been called and notified.

## 2020-04-03 ENCOUNTER — Other Ambulatory Visit: Payer: Self-pay | Admitting: Family Medicine

## 2020-04-04 ENCOUNTER — Telehealth: Payer: Self-pay | Admitting: Family Medicine

## 2020-04-04 LAB — MULTIPLE MYELOMA PANEL, SERUM
Albumin SerPl Elph-Mcnc: 3.1 g/dL (ref 2.9–4.4)
Albumin/Glob SerPl: 0.7 (ref 0.7–1.7)
Alpha 1: 0.2 g/dL (ref 0.0–0.4)
Alpha2 Glob SerPl Elph-Mcnc: 1.1 g/dL — ABNORMAL HIGH (ref 0.4–1.0)
B-Globulin SerPl Elph-Mcnc: 1 g/dL (ref 0.7–1.3)
Gamma Glob SerPl Elph-Mcnc: 2.2 g/dL — ABNORMAL HIGH (ref 0.4–1.8)
Globulin, Total: 4.6 g/dL — ABNORMAL HIGH (ref 2.2–3.9)
IgA: 80 mg/dL (ref 64–422)
IgG (Immunoglobin G), Serum: 2586 mg/dL — ABNORMAL HIGH (ref 586–1602)
IgM (Immunoglobulin M), Srm: 81 mg/dL (ref 26–217)
M Protein SerPl Elph-Mcnc: 2 g/dL — ABNORMAL HIGH
Total Protein ELP: 7.7 g/dL (ref 6.0–8.5)

## 2020-04-04 MED ORDER — PANTOPRAZOLE SODIUM 40 MG PO TBEC: DELAYED_RELEASE_TABLET | ORAL | 1 refills | Status: DC

## 2020-04-04 NOTE — Telephone Encounter (Signed)
Pt is aware of this.  °

## 2020-04-04 NOTE — Telephone Encounter (Signed)
Medication filled to pharmacy as requested.   

## 2020-04-04 NOTE — Telephone Encounter (Signed)
Pt called in asking if we can send in the Pantoprazole to [facility], on 03/06/20 it was sent to the [facility]   Please advise

## 2020-04-04 NOTE — Progress Notes (Signed)
This encounter was created in error - please disregard.

## 2020-04-04 NOTE — Telephone Encounter (Signed)
This was sent to [facility]. They should reach out to the insurance company about providing this.

## 2020-04-04 NOTE — Telephone Encounter (Signed)
Pt called in stating she spoke with [facility] they told her they never received any orders  For the life chair for the pt, please advise and let the pt know where the order was placed.

## 2020-04-05 ENCOUNTER — Other Ambulatory Visit: Payer: Self-pay | Admitting: Hematology

## 2020-04-05 ENCOUNTER — Encounter: Payer: Self-pay | Admitting: Gastroenterology

## 2020-04-05 ENCOUNTER — Ambulatory Visit: Payer: Medicare Other | Admitting: Gastroenterology

## 2020-04-05 DIAGNOSIS — R1310 Dysphagia, unspecified: Secondary | ICD-10-CM

## 2020-04-05 DIAGNOSIS — R110 Nausea: Secondary | ICD-10-CM | POA: Diagnosis not present

## 2020-04-05 DIAGNOSIS — D509 Iron deficiency anemia, unspecified: Secondary | ICD-10-CM

## 2020-04-05 DIAGNOSIS — R195 Other fecal abnormalities: Secondary | ICD-10-CM

## 2020-04-05 MED ORDER — SUPREP BOWEL PREP KIT 17.5-3.13-1.6 GM/177ML PO SOLN: 1.0000 | ORAL | 0 refills | Status: DC

## 2020-04-05 NOTE — Progress Notes (Signed)
Review of pertinent gastrointestinal problems: 1. Intermittent abd pains, UGi symptoms: EGD Dr. Earlean Shawl, 05/2011. Done for "reflux symptoms despite therapy." Findings, "normal EGD" .  2016 evaluation by Dr. Ardis Hughs intermittent epigastric pains, nausea; somewhatpositional. CT scan 07/2014 without clear etiology EGD 08/2014 Dr. Ardis Hughs found minor gastritis: H. Pylori negative. 2. Routine risk for colon cancer: Colonoscopy Dr. Earlean Shawl, 05/2011. Done for "screening." Findings "inflammation at splenic flexure, medium hemorrhoids." pathology was normal. He recommended repeat colonoscopy at 5 years for unclear reasons.    HPI: This is a    pleasant 74 year old woman  She was referred by Dr. Birdie Riddle for Hemoccult positive stool  I last saw her about 3-1/2 years ago here in the office.  Her chief complaint at that time was change in bowel habits.  She was also found to have iron deficiency anemia.  She was microcytic.  She was Hemoccult negative but I recommended that she undergo colonoscopy.  She did not want to go through with that.  I asked her to call back in 1 month to see how she responded to some fiber supplements for her change in bowel habits.  We have not heard from her since then.  Barium esophagram June 2021 indication "GERD, shortness of breath, reflux, nausea, dysphagia" findings "esophageal dysmotility, likely presbyesophagus.  No evidence of esophageal stricture or anatomic explanation for the patient's symptoms.  CT scan of abdomen pelvis without IV contrast June 2021, indication "cough.  GERD.  Poorly controlled acid reflux..."  Findings "no explanation for the patient's symptoms.  No acute process in the abdomen or pelvis."   FOB positive stool June 2021  In the past 2 to 3 months she has been diagnosed with multiple myeloma and has started treatment for that.  She is getting IV infusion of chemotherapy medicine once weekly and that is for 2 months.  Following that she will be getting  the same medicine every other week for 3 months.  Following that she will be getting the medicine once monthly for several more months  CBC March 29, 2020 hemoglobin 9.9, white count 4.9, platelets normal.  I last saw her about 3 and half years ago.  At that time I recommended a colonoscopy for variety of reasons including iron deficiency anemia.  She declined.  We have not heard from her since.  She is here now because of FOBT positive stool.  She says she intermittently does see minor rectal blood around the times of some hemorrhoidal type symptoms.  Preparation H usually helps.  Her daughter and her sister's daughter both were recently diagnosed with colon cancer in their 23s.  She has chronic nausea that has been exacerbated since she started chemotherapy for her multiple myeloma.  She also has chronic intermittent solid food and pill associated dysphagia.  She underwent esophageal testing with a barium esophagram 4 months ago.  See those results summarized above.  Review of systems: Pertinent positive and negative review of systems were noted in the above HPI section. All other review negative.   Past Medical History:  Diagnosis Date  . Anxiety   . Arthritis   . Breast cancer ([place]) 06/13/15  . Cancer ([place]) 2000   breast cancer  . Chronic bronchitis ([place])   . Chronic bronchitis ([place])   . Hyperlipidemia   . Hypertension   . Myocardial infarction ([place]) 2001  . Personal history of radiation therapy   . Restless leg   . Stroke [facility]) 2004   TIA, no deficits  Past Surgical History:  Procedure Laterality Date  . ABDOMINAL HYSTERECTOMY  1985  . BREAST LUMPECTOMY Left 2000   radiation and chemo  . BREAST LUMPECTOMY Right 2016   radiation  . BREAST SURGERY  2001   lt breast lumpectomy  . RADIOACTIVE SEED GUIDED PARTIAL MASTECTOMY WITH AXILLARY SENTINEL LYMPH NODE BIOPSY Right 07/07/2015   Procedure: RIGHT RADIOACTIVE SEED GUIDED PARTIAL MASTECTOMY WITH AXILLARY SENTINEL  LYMPH NODE BIOPSY;  Surgeon: Autumn Messing III, MD;  Location: [place];  Service: General;  Laterality: Right;  . SMALL INTESTINE SURGERY    . TUBAL LIGATION      Current Outpatient Medications  Medication Sig Dispense Refill  . Accu-Chek Softclix Lancets lancets Use as instructed to check sugars 1-2 times daily. 100 each 12  . acyclovir (ZOVIRAX) 400 MG tablet Take 1 tablet (400 mg total) by mouth 2 (two) times daily. 60 tablet 11  . albuterol (VENTOLIN HFA) 108 (90 Base) MCG/ACT inhaler Inhale 2 puffs into the lungs every 6 (six) hours as needed for wheezing or shortness of breath. 8 g 2  . aspirin 81 MG tablet Take 81 mg by mouth daily.    Marland Kitchen atorvastatin (LIPITOR) 10 MG tablet TAKE 1 TABLET BY MOUTH  DAILY 90 tablet 3  . azithromycin (ZITHROMAX Z-PAK) 250 MG tablet 2 tablets day 1 and then 1 tablet days 2 through 5 6 each 0  . dexamethasone (DECADRON) 4 MG tablet Take 3 tabs with breakfast the day after each treatment. 20 tablet 4  . ergocalciferol (VITAMIN D2) 1.25 MG (50000 UT) capsule Take 1 capsule (50,000 Units total) by mouth once a week. 12 capsule 2  . ferrous sulfate 325 (65 FE) MG tablet Take 1 tablet (325 mg total) by mouth daily with breakfast. 30 tablet 6  . fluticasone (FLOVENT HFA) 110 MCG/ACT inhaler Inhale 2 puffs into the lungs in the morning and at bedtime. 1 each 12  . gabapentin (NEURONTIN) 300 MG capsule TAKE 1 CAPSULE BY MOUTH 3  TIMES DAILY 270 capsule 3  . glucose blood (ACCU-CHEK GUIDE) test strip Use as instructed to check sugars 1-2 times daily. 100 each 12  . HYDROcodone-acetaminophen (NORCO) 5-325 MG tablet Take 1 tablet by mouth every 6 (six) hours as needed for moderate pain or severe pain. 60 tablet 0  . loratadine (CLARITIN) 10 MG tablet Take 10 mg by mouth daily.    Marland Kitchen LORazepam (ATIVAN) 0.5 MG tablet Take 1 tablet (0.5 mg total) by mouth every 6 (six) hours as needed (Nausea or vomiting). 30 tablet 0  . losartan (COZAAR) 100 MG tablet TAKE 1  TABLET BY MOUTH  DAILY 90 tablet 3  . MELATONIN PO Take by mouth.     . mometasone (NASONEX) 50 MCG/ACT nasal spray Place 2 sprays into the nose daily. 17 g 12  . ondansetron (ZOFRAN) 8 MG tablet Take 1 tablet (8 mg total) by mouth 2 (two) times daily as needed (Nausea or vomiting). 30 tablet 1  . pantoprazole (PROTONIX) 40 MG tablet TAKE 1 TABLET(40 MG) BY MOUTH TWICE DAILY 90 tablet 1  . prochlorperazine (COMPAZINE) 10 MG tablet Take 1 tablet (10 mg total) by mouth every 6 (six) hours as needed (Nausea or vomiting). 30 tablet 1  . sertraline (ZOLOFT) 50 MG tablet TAKE 1 TABLET(50 MG) BY MOUTH DAILY (Patient taking differently: Take 25 mg by mouth daily. TAKE 1 TABLET(50 MG) BY MOUTH DAILY) 90 tablet 0  . tiZANidine (ZANAFLEX) 4 MG tablet TAKE 1  TABLET(4 MG) BY MOUTH AT BEDTIME 30 tablet 3  . traZODone (DESYREL) 100 MG tablet TAKE 1 TABLET BY MOUTH AT  BEDTIME 90 tablet 3   No current facility-administered medications for this visit.    Allergies as of 04/05/2020 - Review Complete 04/05/2020  Allergen Reaction Noted  . Bacitracin-neomycin-polymyxin  [neomycin-bacitracin zn-polymyx] Swelling 12/29/2014  . Nsaids Other (See Comments) 01/04/2015  . Tape Hives 11/07/2013  . Ambien [zolpidem tartrate]  04/02/2012  . Amoxicillin  02/03/2012  . Contrast media [iodinated diagnostic agents] Hives 11/06/2013  . Latex Swelling 09/14/2014  . Prednisone  09/05/2014    Family History  Problem Relation Age of Onset  . Diabetes Father   . Lung cancer Sister        dx. <50; former smoker  . Diabetes Brother   . Diabetes Paternal Aunt   . Stroke Maternal Grandmother   . Diabetes Paternal Grandmother   . Emphysema Mother 75       smoker  . Diabetes Brother   . Brain cancer Brother 85       unknown tumor type  . Cancer Daughter 69       neck cancer  . Other Daughter        hysterectomy for unspecified reason  . Breast cancer Cousin   . Cancer Cousin        unspecified type  . Breast cancer  Other        triple negative breast cancer in her 44s  . Cancer Daughter   . Colon polyps Neg Hx   . Esophageal cancer Neg Hx   . Gallbladder disease Neg Hx     Social History   Socioeconomic History  . Marital status: Divorced    Spouse name: Not on file  . Number of children: 7  . Years of education: Not on file  . Highest education level: Not on file  Occupational History  . Occupation: retired  Tobacco Use  . Smoking status: Former Smoker    Packs/day: 1.00    Years: 20.00    Pack years: 20.00    Types: Cigarettes    Quit date: 07/30/2011    Years since quitting: 8.6  . Smokeless tobacco: Never Used  . Tobacco comment: Quit >4 years ago; 1 ppd for about 5/20 years (remaining was less)  Vaping Use  . Vaping Use: Former  Substance and Sexual Activity  . Alcohol use: No    Alcohol/week: 0.0 standard drinks  . Drug use: No  . Sexual activity: Not Currently  Other Topics Concern  . Not on file  Social History Narrative   Lives alone.  Retired.  Education:  11th grade GED.  Children:  7 (one here).    Social Determinants of Health   Financial Resource Strain:   . Difficulty of Paying Living Expenses: Not on file  Food Insecurity: No Food Insecurity  . Worried About Charity fundraiser in the Last Year: Never true  . Ran Out of Food in the Last Year: Never true  Transportation Needs: No Transportation Needs  . Lack of Transportation (Medical): No  . Lack of Transportation (Non-Medical): No  Physical Activity:   . Days of Exercise per Week: Not on file  . Minutes of Exercise per Session: Not on file  Stress:   . Feeling of Stress : Not on file  Social Connections:   . Frequency of Communication with Friends and Family: Not on file  . Frequency of Social Gatherings with  Friends and Family: Not on file  . Attends Religious Services: Not on file  . Active Member of Clubs or Organizations: Not on file  . Attends Archivist Meetings: Not on file  . Marital  Status: Not on file  Intimate Partner Violence:   . Fear of Current or Ex-Partner: Not on file  . Emotionally Abused: Not on file  . Physically Abused: Not on file  . Sexually Abused: Not on file     Physical Exam: Wt 179 lb 12.8 oz (81.6 kg)   BMI 35.11 kg/m  Constitutional: generally well-appearing Psychiatric: alert and oriented x3 Eyes: extraocular movements intact Mouth: oral pharynx moist, no lesions Neck: supple no lymphadenopathy Cardiovascular: heart regular rate and rhythm Lungs: clear to auscultation bilaterally Abdomen: soft, nontender, nondistended, no obvious ascites, no peritoneal signs, normal bowel sounds Extremities: no lower extremity edema bilaterally Skin: no lesions on visible extremities   Assessment and plan: 74 y.o. female with iron deficiency anemia, FOBT positive stool, intermittent rectal bleeding, chronic nausea, dysphagia  I recommended a colonoscopy and same time upper endoscopy for her for the above complaints.  I recommended colonoscopy for her about 3-1/2 years ago however she declined.  That was for a change in bowel habits and iron deficiency anemia.  I do have some concern for underlying malignancy at this point especially given her family history of colon cancer.  Timing of the procedures will be a bit tricky because she is getting chemotherapy infusion once weekly for now.  We will reach out to her oncology team and try to coordinate colonoscopy, EGD in our outpatient endoscopy center 7 to 10 days after AN upcoming every 2-week chemotherapy infusion.  It does not look like so far her blood counts have really suffered from chemotherapy based on CBC last week.  Please see the "Patient Instructions" section for addition details about the plan.   Owens Loffler, MD [facility] 04/05/2020, 1:33 PM  Cc: [name], MD  Total time on date of encounter was 45 minutes (this included time spent preparing to see the patient  reviewing records; obtaining and/or reviewing separately obtained history; performing a medically appropriate exam and/or evaluation; counseling and educating the patient and family if present; ordering medications, tests or procedures if applicable; and documenting clinical information in the health record).

## 2020-04-05 NOTE — Patient Instructions (Addendum)
If you are age 76 or older, your body mass index should be between 23-30. Your Body mass index is 35.11 kg/m. If this is out of the aforementioned range listed, please consider follow up with your Primary Care Provider.  If you are age 48 or younger, your body mass index should be between 19-25. Your Body mass index is 35.11 kg/m. If this is out of the aformentioned range listed, please consider follow up with your Primary Care Provider.   You have been scheduled for an endoscopy and colonoscopy. Please follow the written instructions given to you at your visit today. Please pick up your prep supplies at the pharmacy within the next 1-3 days. If you use inhalers (even only as needed), please bring them with you on the day of your procedure.  Thank you for entrusting me with your care and choosing [facility].  Dr Ardis Hughs

## 2020-04-07 ENCOUNTER — Inpatient Hospital Stay: Payer: Medicare Other

## 2020-04-07 ENCOUNTER — Inpatient Hospital Stay (HOSPITAL_BASED_OUTPATIENT_CLINIC_OR_DEPARTMENT_OTHER): Payer: Medicare Other | Admitting: Hematology

## 2020-04-07 ENCOUNTER — Other Ambulatory Visit: Payer: Self-pay

## 2020-04-07 VITALS — BP 139/60 | HR 78 | Temp 97.6°F | Resp 16

## 2020-04-07 DIAGNOSIS — I252 Old myocardial infarction: Secondary | ICD-10-CM | POA: Diagnosis not present

## 2020-04-07 DIAGNOSIS — C9000 Multiple myeloma not having achieved remission: Secondary | ICD-10-CM

## 2020-04-07 DIAGNOSIS — Z923 Personal history of irradiation: Secondary | ICD-10-CM | POA: Diagnosis not present

## 2020-04-07 DIAGNOSIS — Z5112 Encounter for antineoplastic immunotherapy: Secondary | ICD-10-CM | POA: Diagnosis not present

## 2020-04-07 DIAGNOSIS — E1122 Type 2 diabetes mellitus with diabetic chronic kidney disease: Secondary | ICD-10-CM | POA: Diagnosis not present

## 2020-04-07 DIAGNOSIS — Z853 Personal history of malignant neoplasm of breast: Secondary | ICD-10-CM | POA: Diagnosis not present

## 2020-04-07 DIAGNOSIS — Z8673 Personal history of transient ischemic attack (TIA), and cerebral infarction without residual deficits: Secondary | ICD-10-CM | POA: Diagnosis not present

## 2020-04-07 DIAGNOSIS — Z87891 Personal history of nicotine dependence: Secondary | ICD-10-CM | POA: Diagnosis not present

## 2020-04-07 DIAGNOSIS — E1140 Type 2 diabetes mellitus with diabetic neuropathy, unspecified: Secondary | ICD-10-CM | POA: Diagnosis not present

## 2020-04-07 DIAGNOSIS — D509 Iron deficiency anemia, unspecified: Secondary | ICD-10-CM

## 2020-04-07 DIAGNOSIS — N189 Chronic kidney disease, unspecified: Secondary | ICD-10-CM | POA: Diagnosis not present

## 2020-04-07 DIAGNOSIS — Z7189 Other specified counseling: Secondary | ICD-10-CM

## 2020-04-07 LAB — CMP (CANCER CENTER ONLY)
ALT: 11 U/L (ref 0–44)
AST: 10 U/L — ABNORMAL LOW (ref 15–41)
Albumin: 3 g/dL — ABNORMAL LOW (ref 3.5–5.0)
Alkaline Phosphatase: 94 U/L (ref 38–126)
Anion gap: 8 (ref 5–15)
BUN: 16 mg/dL (ref 8–23)
CO2: 21 mmol/L — ABNORMAL LOW (ref 22–32)
Calcium: 8.9 mg/dL (ref 8.9–10.3)
Chloride: 108 mmol/L (ref 98–111)
Creatinine: 1.13 mg/dL — ABNORMAL HIGH (ref 0.44–1.00)
GFR, Est AFR Am: 55 mL/min — ABNORMAL LOW (ref >60)
GFR, Estimated: 47 mL/min — ABNORMAL LOW (ref >60)
Glucose, Bld: 112 mg/dL — ABNORMAL HIGH (ref 70–99)
Potassium: 4.1 mmol/L (ref 3.5–5.1)
Sodium: 137 mmol/L (ref 135–145)
Total Bilirubin: <0.2 mg/dL — ABNORMAL LOW (ref 0.3–1.2)
Total Protein: 8.4 g/dL — ABNORMAL HIGH (ref 6.5–8.1)

## 2020-04-07 LAB — CBC WITH DIFFERENTIAL/PLATELET
Abs Immature Granulocytes: 0.02 10*3/uL (ref 0.00–0.07)
Basophils Absolute: 0 10*3/uL (ref 0.0–0.1)
Basophils Relative: 0 %
Eosinophils Absolute: 0 10*3/uL (ref 0.0–0.5)
Eosinophils Relative: 1 %
HCT: 29.6 % — ABNORMAL LOW (ref 36.0–46.0)
Hemoglobin: 9.7 g/dL — ABNORMAL LOW (ref 12.0–15.0)
Immature Granulocytes: 0 %
Lymphocytes Relative: 52 %
Lymphs Abs: 3 10*3/uL (ref 0.7–4.0)
MCH: 31.1 pg (ref 26.0–34.0)
MCHC: 32.8 g/dL (ref 30.0–36.0)
MCV: 94.9 fL (ref 80.0–100.0)
Monocytes Absolute: 0.4 10*3/uL (ref 0.1–1.0)
Monocytes Relative: 6 %
Neutro Abs: 2.4 10*3/uL (ref 1.7–7.7)
Neutrophils Relative %: 41 %
Platelets: 212 10*3/uL (ref 150–400)
RBC: 3.12 MIL/uL — ABNORMAL LOW (ref 3.87–5.11)
RDW: 14.8 % (ref 11.5–15.5)
WBC: 5.8 10*3/uL (ref 4.0–10.5)
nRBC: 0 % (ref 0.0–0.2)

## 2020-04-07 MED ORDER — MONTELUKAST SODIUM 10 MG PO TABS
10.0000 mg | ORAL_TABLET | Freq: Once | ORAL | Status: AC
  Administered 2020-04-07: 10 mg via ORAL

## 2020-04-07 MED ORDER — ZOLEDRONIC ACID 4 MG/100ML IV SOLN
4.0000 mg | Freq: Once | INTRAVENOUS | Status: AC
  Administered 2020-04-07: 4 mg via INTRAVENOUS

## 2020-04-07 MED ORDER — DIPHENHYDRAMINE HCL 25 MG PO CAPS
50.0000 mg | ORAL_CAPSULE | Freq: Once | ORAL | Status: AC
  Administered 2020-04-07: 50 mg via ORAL

## 2020-04-07 MED ORDER — SODIUM CHLORIDE 0.9 % IV SOLN
20.0000 mg | Freq: Once | INTRAVENOUS | Status: AC
  Administered 2020-04-07: 20 mg via INTRAVENOUS
  Filled 2020-04-07: qty 20

## 2020-04-07 MED ORDER — ACETAMINOPHEN 325 MG PO TABS
ORAL_TABLET | ORAL | Status: AC
  Filled 2020-04-07: qty 2

## 2020-04-07 MED ORDER — ZOLEDRONIC ACID 4 MG/100ML IV SOLN
INTRAVENOUS | Status: AC
  Filled 2020-04-07: qty 100

## 2020-04-07 MED ORDER — SODIUM CHLORIDE 0.9 % IV SOLN
Freq: Once | INTRAVENOUS | Status: AC
  Filled 2020-04-07: qty 250

## 2020-04-07 MED ORDER — ACETAMINOPHEN 325 MG PO TABS
650.0000 mg | ORAL_TABLET | Freq: Once | ORAL | Status: AC
  Administered 2020-04-07: 650 mg via ORAL

## 2020-04-07 MED ORDER — DIPHENHYDRAMINE HCL 25 MG PO CAPS
ORAL_CAPSULE | ORAL | Status: AC
  Filled 2020-04-07: qty 2

## 2020-04-07 MED ORDER — SODIUM CHLORIDE 0.9 % IV SOLN
16.0000 mg/kg | Freq: Once | INTRAVENOUS | Status: AC
  Administered 2020-04-07: 1400 mg via INTRAVENOUS
  Filled 2020-04-07: qty 60

## 2020-04-07 MED ORDER — FAMOTIDINE IN NACL 20-0.9 MG/50ML-% IV SOLN
20.0000 mg | Freq: Once | INTRAVENOUS | Status: AC
  Administered 2020-04-07: 20 mg via INTRAVENOUS

## 2020-04-07 MED ORDER — ONDANSETRON HCL 4 MG/2ML IJ SOLN
8.0000 mg | Freq: Once | INTRAMUSCULAR | Status: AC
  Administered 2020-04-07: 8 mg via INTRAVENOUS

## 2020-04-07 MED ORDER — ONDANSETRON HCL 4 MG/2ML IJ SOLN
INTRAMUSCULAR | Status: AC
  Filled 2020-04-07: qty 4

## 2020-04-07 MED ORDER — FAMOTIDINE IN NACL 20-0.9 MG/50ML-% IV SOLN
INTRAVENOUS | Status: AC
  Filled 2020-04-07: qty 50

## 2020-04-07 MED ORDER — MONTELUKAST SODIUM 10 MG PO TABS
ORAL_TABLET | ORAL | Status: AC
  Filled 2020-04-07: qty 1

## 2020-04-07 NOTE — Progress Notes (Signed)
During daratumumab infusion, patient began c/o sudden onset of nausea. Infusion paused. No other complaints voiced. Sandi Mealy, PA-C aware. Orders received, repeated, and confirmed. Medicated as documented in [facility]. Patient verbalized near-immediate resolution of symptom. Daratumumab resumed.

## 2020-04-07 NOTE — Progress Notes (Signed)
HEMATOLOGY/ONCOLOGY CLINIC NOTE  Date of Service: 04/07/2020  Patient Care Team: [name], MD as PCP - General (Family Medicine) [name], MD as Attending Physician (Physical Medicine and Rehabilitation) [name], MD as Consulting Physician (Orthopedic Surgery) [name], MD as Consulting Physician (Otolaryngology) [name], MD as Consulting Physician (Gastroenterology) [name], MD as Consulting Physician (General Surgery) [name], MD as Consulting Physician (Hematology and Oncology) [name], MD as Consulting Physician (Radiation Oncology) [name], NP as Nurse Practitioner (Hematology and Oncology) [name], [facility] as Pharmacist (Pharmacist)  CHIEF COMPLAINTS/PURPOSE OF CONSULTATION:  Multiple Myeloma not having achieved remission  HISTORY OF PRESENTING ILLNESS:  Tracy [name] is a wonderful 74 y.o. female who has been referred to [place] by Dr [name] for evaluation and management of Multiple Myeloma. Pt is accompanied today by her daughter in person and other daughter and granddaughter via phone. The pt reports that she is doing well overall.   When pt was first diagnosed with Breast Cancer it was localized in both of her breasts and was considered Stage 1. She then received chemotherapy. However, she declined chemotherapy a second time and chose radiation therapy after recurrence. Pt has been under the surveillance of Dr. [name] after declining antiestrogen therapy.   The pt reports that she was having difficulty breathing so she saw her Pulmonologist, who ordered the work up. Based on the results of the PET/CT pt was sent to Dr. [name] as they were concerned that her breast cancer had spread. Pt has been becoming increasingly anemic over the last year, despite using a PO Iron supplement. She has had a positive Fecal Occult tests, but has been having issues with hemorrhoids and hemorrhoidal bleeding for the last  5-6 months. Pt denies any blood in her stools but notices blood on the tissue after she wipes. Pt has also been having some discomfort in her left hip and lower back. This pain is worsened when she sits down.   Pt has had a chronic cough that has been thought to either be Bronchitis or a result of acid reflux. Pt is planning to receive an Endoscopy to work this up further. She is currently taking Gabapentin for tingling/burning in her extremities. Pt has Type II Diabetes and was previously using Metformin, but discontinued due to concern for liver damage. She is not currently using any medications to control her Diabetes.   Pt had a heart attack in 2001. She initially though that she had a [employer], until the sensation began to travel up her body. She was given Nitroglycerin upon admittance to the hospital. Pt did not require any stents or other interventions. No cause for her heart attack was ever discovered. Pt has been seen by Neurology, Dr. Leta Baptist, who completed imaging on her neck and found a degeneratve disk. Pt has not had a nerve study conducted. Pt has no history of Shingles and has not received the Shingles vaccine. She has been fully vaccinated against he COVID19 virus.    Of note prior to the patient's visit today, pt has had PET/CT ([id number]) completed on 01/20/2020 with results revealing "1. Hypermetabolism corresponding to left acetabular and L5 lytic lesions, with differential considerations of metastatic disease or myeloma. 2. No evidence of hypermetabolic soft tissue primary, metastatic disease, or soft tissue myeloma. 3. Incidental findings, including: Aortic atherosclerosis (ICD10-I70.0) and emphysema (ICD10-J43.9). Hepatic steatosis."   Pt has had Bone Marrow Surgical Pathology ([id number]) completed on 01/17/2020 which revealed "BONE  MARROW, ASPIRATE, CLOT, CORE: -Hypercellular bone marrow for age with plasma cell neoplasm PERIPHERAL BLOOD: -Normocytic-normochromic  anemia"  Pt has had Left Ischium Biopsy Surgical Pathology Report [id number]) completed on 01/17/2020 which revealed "Plasma cell neoplasm."  Most recent lab results (01/25/2020) of CBC is as follows: all values are WNL except for RBC at 2.44, Hgb at 7.5, HCT at 24.0, CO2 at 21, Glucose at 129, Creatinine at 1.38, Total Protein at 10.1, Albumin at 2.8, Total Bilirubin at <0.2, GFR Est Afr Am at 43. 01/25/2020 K/L light chains is as follows: Kappa free light chain at 22.7, Lamda free light chains at 17.7, K/L light chain ratio at 1.28 01/25/2020 MMP is as follows: all values are WNL except for IgG at 5220, Total Protein at 10.1, Alpha2 Glob at 1.2, Gamma Glob at 3.9, M Protein at 3.7, Total Globulin at 6.7, Albumin/Glob at 0.6. 01/25/2020 24-hr UPEP shows all values are WNL  On review of systems, pt reports lower back/left hip pain, unexpected weight loss, abdominal pain, loose stools and denies low appetite, constipation, rashes and any other symptoms.   On PMHx the pt reports Breast Cancer, Breast Lumpectomy, TIA, Restless leg, Myocardial infarction, HTN, HLD, Chronic Bronchitis.  INTERVAL HISTORY: Tracy [name] is a wonderful 74 y.o. female who is here for evaluation and management of Multiple Myeloma. She is here for C3D1 Daratumumab. The patient's last visit with [place] was on 03/24/2020. The pt reports that she is doing well overall.  The pt reports that she has been experiencing more nausea, but is feeling better overall. She will begin using the anti-nausea medications prescribed. Pt feels sharp pains along her left arm occasionally. Her hip pain has improved.   Her Gastroenterologist would like to complete a Colonoscopy and Endoscopy in October.  Lab results today (04/07/20) of CBC w/diff and CMP is as follows: all values are WNL except for RBC at 3.12, Hgb at 9.7, HCT at 29.6, CO2 at 21, Glucose at 112, Creatinine at 1.13, Total Protein at 8.4, Albumin at 3.0, AST at 10, Total  Bilirubin at <0.2, GFR Est Afr Am at 55.  On review of systems, pt reports nausea, left arm pain/swelling, improving hip pain and denies abdominal pain and any other symptoms.   MEDICAL HISTORY:  Past Medical History:  Diagnosis Date  . Anxiety   . Arthritis   . Breast cancer ([place]) 06/13/15  . Cancer ([place]) 2000   breast cancer  . Chronic bronchitis ([place])   . Chronic bronchitis ([place])   . Hyperlipidemia   . Hypertension   . Myocardial infarction ([place]) 2001  . Personal history of radiation therapy   . Restless leg   . Stroke [facility]) 2004   TIA, no deficits    SURGICAL HISTORY: Past Surgical History:  Procedure Laterality Date  . ABDOMINAL HYSTERECTOMY  1985  . BREAST LUMPECTOMY Left 2000   radiation and chemo  . BREAST LUMPECTOMY Right 2016   radiation  . BREAST SURGERY  2001   lt breast lumpectomy  . RADIOACTIVE SEED GUIDED PARTIAL MASTECTOMY WITH AXILLARY SENTINEL LYMPH NODE BIOPSY Right 07/07/2015   Procedure: RIGHT RADIOACTIVE SEED GUIDED PARTIAL MASTECTOMY WITH AXILLARY SENTINEL LYMPH NODE BIOPSY;  Surgeon: Autumn Messing III, MD;  Location: [facility];  Service: General;  Laterality: Right;  . SMALL INTESTINE SURGERY    . TUBAL LIGATION      SOCIAL HISTORY: Social History   Socioeconomic History  . Marital status: Divorced    Spouse name: Not  on file  . Number of children: 7  . Years of education: Not on file  . Highest education level: Not on file  Occupational History  . Occupation: retired  Tobacco Use  . Smoking status: Former Smoker    Packs/day: 1.00    Years: 20.00    Pack years: 20.00    Types: Cigarettes    Quit date: 07/30/2011    Years since quitting: 8.6  . Smokeless tobacco: Never Used  . Tobacco comment: Quit >4 years ago; 1 ppd for about 5/20 years (remaining was less)  Vaping Use  . Vaping Use: Former  Substance and Sexual Activity  . Alcohol use: No    Alcohol/week: 0.0 standard drinks  . Drug use: No  . Sexual activity: Not  Currently  Other Topics Concern  . Not on file  Social History Narrative   Lives alone.  Retired.  Education:  11th grade GED.  Children:  7 (one here).    Social Determinants of Health   Financial Resource Strain:   . Difficulty of Paying Living Expenses: Not on file  Food Insecurity: No Food Insecurity  . Worried About Charity fundraiser in the Last Year: Never true  . Ran Out of Food in the Last Year: Never true  Transportation Needs: No Transportation Needs  . Lack of Transportation (Medical): No  . Lack of Transportation (Non-Medical): No  Physical Activity:   . Days of Exercise per Week: Not on file  . Minutes of Exercise per Session: Not on file  Stress:   . Feeling of Stress : Not on file  Social Connections:   . Frequency of Communication with Friends and Family: Not on file  . Frequency of Social Gatherings with Friends and Family: Not on file  . Attends Religious Services: Not on file  . Active Member of Clubs or Organizations: Not on file  . Attends Archivist Meetings: Not on file  . Marital Status: Not on file  Intimate Partner Violence:   . Fear of Current or Ex-Partner: Not on file  . Emotionally Abused: Not on file  . Physically Abused: Not on file  . Sexually Abused: Not on file    FAMILY HISTORY: Family History  Problem Relation Age of Onset  . Diabetes Father   . Lung cancer Sister        dx. <50; former smoker  . Diabetes Brother   . Diabetes Paternal Aunt   . Stroke Maternal Grandmother   . Diabetes Paternal Grandmother   . Emphysema Mother 47       smoker  . Diabetes Brother   . Brain cancer Brother 42       unknown tumor type  . Cancer Daughter 35       neck cancer  . Other Daughter        hysterectomy for unspecified reason  . Breast cancer Cousin   . Cancer Cousin        unspecified type  . Breast cancer Other        triple negative breast cancer in her 26s  . Cancer Daughter   . Colon polyps Neg Hx   . Esophageal  cancer Neg Hx   . Gallbladder disease Neg Hx     ALLERGIES:  is allergic to bacitracin-neomycin-polymyxin  [neomycin-bacitracin zn-polymyx], nsaids, tape, ambien [zolpidem tartrate], amoxicillin, contrast media [iodinated diagnostic agents], latex, and prednisone.  MEDICATIONS:  Current Outpatient Medications  Medication Sig Dispense Refill  . Accu-Chek Softclix  Lancets lancets Use as instructed to check sugars 1-2 times daily. 100 each 12  . acyclovir (ZOVIRAX) 400 MG tablet Take 1 tablet (400 mg total) by mouth 2 (two) times daily. 60 tablet 11  . albuterol (VENTOLIN HFA) 108 (90 Base) MCG/ACT inhaler Inhale 2 puffs into the lungs every 6 (six) hours as needed for wheezing or shortness of breath. 8 g 2  . aspirin 81 MG tablet Take 81 mg by mouth daily.    Marland Kitchen atorvastatin (LIPITOR) 10 MG tablet TAKE 1 TABLET BY MOUTH  DAILY 90 tablet 3  . azithromycin (ZITHROMAX Z-PAK) 250 MG tablet 2 tablets day 1 and then 1 tablet days 2 through 5 6 each 0  . dexamethasone (DECADRON) 4 MG tablet Take 3 tabs with breakfast the day after each treatment. 20 tablet 4  . ergocalciferol (VITAMIN D2) 1.25 MG (50000 UT) capsule Take 1 capsule (50,000 Units total) by mouth once a week. 12 capsule 2  . ferrous sulfate 325 (65 FE) MG tablet Take 1 tablet (325 mg total) by mouth daily with breakfast. 30 tablet 6  . fluticasone (FLOVENT HFA) 110 MCG/ACT inhaler Inhale 2 puffs into the lungs in the morning and at bedtime. 1 each 12  . gabapentin (NEURONTIN) 300 MG capsule TAKE 1 CAPSULE BY MOUTH 3  TIMES DAILY 270 capsule 3  . glucose blood (ACCU-CHEK GUIDE) test strip Use as instructed to check sugars 1-2 times daily. 100 each 12  . HYDROcodone-acetaminophen (NORCO) 5-325 MG tablet Take 1 tablet by mouth every 6 (six) hours as needed for moderate pain or severe pain. 60 tablet 0  . loratadine (CLARITIN) 10 MG tablet Take 10 mg by mouth daily.    Marland Kitchen LORazepam (ATIVAN) 0.5 MG tablet Take 1 tablet (0.5 mg total) by mouth  every 6 (six) hours as needed (Nausea or vomiting). 30 tablet 0  . losartan (COZAAR) 100 MG tablet TAKE 1 TABLET BY MOUTH  DAILY 90 tablet 3  . MELATONIN PO Take by mouth.     . mometasone (NASONEX) 50 MCG/ACT nasal spray Place 2 sprays into the nose daily. 17 g 12  . Na Sulfate-K Sulfate-Mg Sulf (SUPREP BOWEL PREP KIT) 17.5-3.13-1.6 GM/177ML SOLN Take 1 kit by mouth as directed. 324 mL 0  . ondansetron (ZOFRAN) 8 MG tablet Take 1 tablet (8 mg total) by mouth 2 (two) times daily as needed (Nausea or vomiting). 30 tablet 1  . pantoprazole (PROTONIX) 40 MG tablet TAKE 1 TABLET(40 MG) BY MOUTH TWICE DAILY 90 tablet 1  . prochlorperazine (COMPAZINE) 10 MG tablet Take 1 tablet (10 mg total) by mouth every 6 (six) hours as needed (Nausea or vomiting). 30 tablet 1  . sertraline (ZOLOFT) 50 MG tablet TAKE 1 TABLET(50 MG) BY MOUTH DAILY (Patient taking differently: Take 25 mg by mouth daily. TAKE 1 TABLET(50 MG) BY MOUTH DAILY) 90 tablet 0  . tiZANidine (ZANAFLEX) 4 MG tablet TAKE 1 TABLET(4 MG) BY MOUTH AT BEDTIME 30 tablet 3  . traZODone (DESYREL) 100 MG tablet TAKE 1 TABLET BY MOUTH AT  BEDTIME 90 tablet 3   No current facility-administered medications for this visit.    REVIEW OF SYSTEMS:   A 10+ POINT REVIEW OF SYSTEMS WAS OBTAINED including neurology, dermatology, psychiatry, cardiac, respiratory, lymph, extremities, GI, GU, Musculoskeletal, constitutional, breasts, reproductive, HEENT.  All pertinent positives are noted in the HPI.  All others are negative.   PHYSICAL EXAMINATION: ECOG PERFORMANCE STATUS: 2 - Symptomatic, <50% confined to bed  . VS  reviewed Exam was given in a chair   GENERAL:alert, in no acute distress and comfortable SKIN: no acute rashes, no significant lesions EYES: conjunctiva are pink and non-injected, sclera anicteric OROPHARYNX: MMM, no exudates, no oropharyngeal erythema or ulceration NECK: supple, no JVD LYMPH:  no palpable lymphadenopathy in the cervical,  axillary or inguinal regions LUNGS: clear to auscultation b/l with normal respiratory effort HEART: regular rate & rhythm ABDOMEN:  normoactive bowel sounds , non tender, not distended. No palpable hepatosplenomegaly.  Extremity: no pedal edema PSYCH: alert & oriented x 3 with fluent speech NEURO: no focal motor/sensory deficits  LABORATORY DATA:  I have reviewed the data as listed  . CBC Latest Ref Rng & Units 04/07/2020 03/29/2020 03/24/2020  WBC 4.0 - 10.5 K/uL 5.8 4.9 4.9  Hemoglobin 12.0 - 15.0 g/dL 9.7(L) 9.9(L) 9.0(L)  Hematocrit 36 - 46 % 29.6(L) 30.4(L) 27.2(L)  Platelets 150 - 400 K/uL 212 233 192    . CMP Latest Ref Rng & Units 04/07/2020 03/29/2020 03/24/2020  Glucose 70 - 99 mg/dL 112(H) 139(H) 113(H)  BUN 8 - 23 mg/dL '16 15 9  ' Creatinine 0.44 - 1.00 mg/dL 1.13(H) 1.04(H) 0.93  Sodium 135 - 145 mmol/L 137 139 136  Potassium 3.5 - 5.1 mmol/L 4.1 3.8 3.5  Chloride 98 - 111 mmol/L 108 105 106  CO2 22 - 32 mmol/L 21(L) 25 23  Calcium 8.9 - 10.3 mg/dL 8.9 9.3 8.3(L)  Total Protein 6.5 - 8.1 g/dL 8.4(H) 8.2(H) 8.2(H)  Total Bilirubin 0.3 - 1.2 mg/dL <0.2(L) 0.3 <0.1(L)  Alkaline Phos 38 - 126 U/L 94 98 85  AST 15 - 41 U/L 10(L) 8(L) 14(L)  ALT 0 - 44 U/L '11 10 14   ' 01/25/2020 K/L light chains:    01/25/2020 MMP:            RADIOGRAPHIC STUDIES: I have personally reviewed the radiological images as listed and agreed with the findings in the report. No results found.  ASSESSMENT & PLAN:   74 yo with   1) Newly diagnosed IgG Kappa Multiple myeloma with bone lesions, anemia, renal insuff. M spike @ 3.7g/dl 1p deletion, polymorphic variant, 13q deletion 2) h/o Dm2 3) Diabetic Neuropathy 4) CKD - likely from DM2, but could have an element of myeloma kidney. 5) h/o TIA and AMI 6) Iron deficiency   PLAN: -Discussed pt labwork today, 04/07/20; anemia, kidney function, and Calcium are stable.  -Discussed 03/29/2020 MMP shows M protein down to 2.0 g/dL, LDH is  WNL -Advised pt that she is nearly at 50% response, would aim for at least 90% response.  -Discussed adding in Carflizomib, which would be administered IV, for a better response. -The pt has no prohibitive toxicities from continuing C3D1 Daratumumab necessitating treatment change at this time. -Advised pt that due to the ongoing demands of treatment, including repeated labs and IV treatments, would recommend a Port placement.  -Advised pt that it is okay to proceed with Endoscopy & Colonoscopy as her treatments will be ongoing.  -Recommend pt begin using anti-nausea medications as prescribed.  -Will get Port-a-cath placement with IR in 1 week.  -Will begin Carflizomib with C4. -Will begin Zometa today.  -Will see pt back in 2 weeks with labs   FOLLOW UP: Port a cath placement in 1 week Please schedule C3D15 and C4D1 and C4D15 with portflush and labs MD visit with C4D1   The total time spent in the appt was 20 minutes and more than 50% was on counseling  and direct patient cares.  All of the patient's questions were answered with apparent satisfaction. The patient knows to call the clinic with any problems, questions or concerns.   [name] MD [place] AAHIVMS [facility] Hematology/Oncology Physician [facility]  (Office):       [phone] (Work cell):  [phone] (Fax):           [phone]  04/07/2020 3:37 PM   I, Yevette Edwards, am acting as a scribe for Dr. [name].   .I have reviewed the above documentation for accuracy and completeness, and I agree with the above. [name] MD

## 2020-04-07 NOTE — Patient Instructions (Addendum)
[facility] Discharge Instructions for Patients Receiving Chemotherapy  Today you received the following chemotherapy agents: daratumumab.  To help prevent nausea and vomiting after your treatment, we encourage you to take your nausea medication as directed.   If you develop nausea and vomiting that is not controlled by your nausea medication, call the clinic.   BELOW ARE SYMPTOMS THAT SHOULD BE REPORTED IMMEDIATELY:  *FEVER GREATER THAN 100.5 F  *CHILLS WITH OR WITHOUT FEVER  NAUSEA AND VOMITING THAT IS NOT CONTROLLED WITH YOUR NAUSEA MEDICATION  *UNUSUAL SHORTNESS OF BREATH  *UNUSUAL BRUISING OR BLEEDING  TENDERNESS IN MOUTH AND THROAT WITH OR WITHOUT PRESENCE OF ULCERS  *URINARY PROBLEMS  *BOWEL PROBLEMS  UNUSUAL RASH Items with * indicate a potential emergency and should be followed up as soon as possible.  Feel free to call the clinic should you have any questions or concerns. The clinic phone number is [phone].  Please show the [place] [facility] at check-in to the Emergency Department and triage nurse.  Zoledronic Acid injection (Hypercalcemia, Oncology)  What is this medicine? ZOLEDRONIC ACID (ZOE le dron ik AS id) lowers the amount of calcium loss from bone. It is used to treat too much calcium in your blood from cancer. It is also used to prevent complications of cancer that has spread to the bone. This medicine may be used for other purposes; ask your health care provider or pharmacist if you have questions. COMMON BRAND NAME(S): Zometa What should I tell my health care provider before I take this medicine? They need to know if you have any of these conditions:  aspirin-sensitive asthma  cancer, especially if you are receiving medicines used to treat cancer  dental disease or wear dentures  infection  kidney disease  receiving corticosteroids like dexamethasone or prednisone  an unusual or allergic reaction to zoledronic acid, other  medicines, foods, dyes, or preservatives  pregnant or trying to get pregnant  breast-feeding How should I use this medicine? This medicine is for infusion into a vein. It is given by a health care professional in a hospital or clinic setting. Talk to your pediatrician regarding the use of this medicine in children. Special care may be needed. Overdosage: If you think you have taken too much of this medicine contact a poison control center or emergency room at once. NOTE: This medicine is only for you. Do not share this medicine with others. What if I miss a dose? It is important not to miss your dose. Call your doctor or health care professional if you are unable to keep an appointment. What may interact with this medicine?  certain antibiotics given by injection  NSAIDs, medicines for pain and inflammation, like ibuprofen or naproxen  some diuretics like bumetanide, furosemide  teriparatide  thalidomide This list may not describe all possible interactions. Give your health care provider a list of all the medicines, herbs, non-prescription drugs, or dietary supplements you use. Also tell them if you smoke, drink alcohol, or use illegal drugs. Some items may interact with your medicine. What should I watch for while using this medicine? Visit your doctor or health care professional for regular checkups. It may be some time before you see the benefit from this medicine. Do not stop taking your medicine unless your doctor tells you to. Your doctor may order blood tests or other tests to see how you are doing. Women should inform their doctor if they wish to become pregnant or think they might be  pregnant. There is a potential for serious side effects to an unborn child. Talk to your health care professional or pharmacist for more information. You should make sure that you get enough calcium and vitamin D while you are taking this medicine. Discuss the foods you eat and the vitamins you take  with your health care professional. Some people who take this medicine have severe bone, joint, and/or muscle pain. This medicine may also increase your risk for jaw problems or a broken thigh bone. Tell your doctor right away if you have severe pain in your jaw, bones, joints, or muscles. Tell your doctor if you have any pain that does not go away or that gets worse. Tell your dentist and dental surgeon that you are taking this medicine. You should not have major dental surgery while on this medicine. See your dentist to have a dental exam and fix any dental problems before starting this medicine. Take good care of your teeth while on this medicine. Make sure you see your dentist for regular follow-up appointments. What side effects may I notice from receiving this medicine? Side effects that you should report to your doctor or health care professional as soon as possible:  allergic reactions like skin rash, itching or hives, swelling of the face, lips, or tongue  anxiety, confusion, or depression  breathing problems  changes in vision  eye pain  feeling faint or lightheaded, falls  jaw pain, especially after dental work  mouth sores  muscle cramps, stiffness, or weakness  redness, blistering, peeling or loosening of the skin, including inside the mouth  trouble passing urine or change in the amount of urine Side effects that usually do not require medical attention (report to your doctor or health care professional if they continue or are bothersome):  bone, joint, or muscle pain  constipation  diarrhea  fever  hair loss  irritation at site where injected  loss of appetite  nausea, vomiting  stomach upset  trouble sleeping  trouble swallowing  weak or tired This list may not describe all possible side effects. Call your doctor for medical advice about side effects. You may report side effects to FDA at 1-800-FDA-1088. Where should I keep my medicine? This drug  is given in a hospital or clinic and will not be stored at home. NOTE: This sheet is a summary. It may not cover all possible information. If you have questions about this medicine, talk to your doctor, pharmacist, or health care provider.  2020 Elsevier/Gold Standard (2013-12-11 14:19:39)

## 2020-04-12 ENCOUNTER — Telehealth: Payer: Self-pay | Admitting: *Deleted

## 2020-04-12 NOTE — Telephone Encounter (Signed)
Patient called - concerned about possible side effects of new medicine started Friday: having pain in joints on left side of body - shoulder,hips and hand. States pain kept her awake last night and it is painful bending her left arm. Also states she is having pain in her eyes with bright lights and vision seems blurry. Dr. Irene Limbo informed.  Contacted patient with Dr. Grier Mitts response: take tylenol or pain meds as ordered for pain tonite. Contact PCP tomorrow for evaluation of symptoms. If symptoms persist or increase please let this office know as well. Patient verbalized understanding.

## 2020-04-13 ENCOUNTER — Telehealth: Payer: Self-pay | Admitting: Radiation Oncology

## 2020-04-13 ENCOUNTER — Other Ambulatory Visit: Payer: Self-pay

## 2020-04-13 ENCOUNTER — Encounter: Payer: Self-pay | Admitting: Urology

## 2020-04-13 ENCOUNTER — Ambulatory Visit
Admission: RE | Admit: 2020-04-13 | Discharge: 2020-04-13 | Disposition: A | Discharge status: Home / Self Care | Payer: Medicare Other | Source: Ambulatory Visit | Attending: Urology | Admitting: Urology

## 2020-04-13 ENCOUNTER — Other Ambulatory Visit: Payer: Self-pay | Admitting: Family Medicine

## 2020-04-13 VITALS — BP 119/52 | HR 87 | Temp 98.1°F | Resp 20 | Ht 60.0 in | Wt 180.8 lb

## 2020-04-13 DIAGNOSIS — C9000 Multiple myeloma not having achieved remission: Secondary | ICD-10-CM

## 2020-04-13 NOTE — Telephone Encounter (Signed)
Phoned patient as requested by Freeman Caldron, PA-C to inquire about status since completion of xrt. No answer. Left voicemail message requesting return call. Phoned daughter, [name], to inquire. No answer. Left voicemail message. Phoned daughter, Kalman Shan. Rose lives in [place]. She states, "Mom is fine but I know she has some questions for the doctor." Rose wrote down my direct number and assured me she would have her mother call me back.

## 2020-04-13 NOTE — Progress Notes (Signed)
Radiation Oncology         [phone] ________________________________  Name: Tracy [name] MRN: [id number]  Date: 03/13/2020  DOB: 1946-05-02  End of Treatment Note  Diagnosis:   74 yo woman with multiple myeloma and symptomatic involvement of L5 and left hip     Indication for treatment:  Palliation       Radiation treatment dates:   02/29/20 - 03/13/20  Site/dose:   The targets were treated to a total dose of 20 Gy in 10 fractions of 2 Gy each to the left hip and L5 using one plan.  Beams/energy:   A 3D field set-up was employed with 10 and 15 MV X-rays  Narrative: The patient tolerated radiation treatment relatively well and noted some minimal improvement in her pain at completion of treatment.  Plan: The patient has completed radiation treatment. The patient will return to radiation oncology clinic for routine followup in one month. I advised her to call or return sooner if she has any questions or concerns related to her recovery or treatment. ________________________________  Sheral Apley. Tammi Klippel, M.D.

## 2020-04-13 NOTE — Progress Notes (Signed)
Patient scheduled for a 1 pm telephone follow up one month s/p completion of radiation. Patient checked into the cancer center at 1:25 pm. Unfortunately, Ashlyn Bruning, PA-C was pulled to manage an emergent patient and unable to see this patient face to face. Discussed the following concerns with the patient. Patient understands Freeman Caldron, PA-C will phone her to address her needs.   Patient using a cane or wheelchair to get around. She can walk short distances with a cane but transfers to a buggy or wheelchair for long distances. She reports being unable to ambulate long distances due to pain in her hips and weakness. Reports that overall her gait is wobbly.    Patient questions if the radiation worked because she continues to have a lot of pain on her right side despite her understanding that "the lesion on the left is the worst." Patient reports RLE numbness has resolved. Reports stabbing bilateral hip pain when sitting or standing if she "doesn't move just the right way." No bilateral lower extremity edema noted. Reports mostly taking Tylenol to manage her pain but that she did take one hydrocodone-acetaminophen this week because the "pain was too much to bear." Reports when taking hydrocodone-acetaminophen she struggles with constiption.

## 2020-04-14 ENCOUNTER — Other Ambulatory Visit: Payer: Self-pay | Admitting: Family Medicine

## 2020-04-14 NOTE — Telephone Encounter (Signed)
Patient called and said that she needs to start back on her metformin because the cancer treatments for causing a rise in her blood sugar.  Please advise.

## 2020-04-17 NOTE — Telephone Encounter (Signed)
Please advise 

## 2020-04-17 NOTE — Telephone Encounter (Signed)
This is where chair order was sent.

## 2020-04-17 NOTE — Telephone Encounter (Signed)
Patient called and said that she checked with [facility] and they told her that they are now [facility] and that an order for a lift chair would have to be sent to them.  Please advise

## 2020-04-18 ENCOUNTER — Telehealth: Payer: Self-pay | Admitting: Family Medicine

## 2020-04-18 NOTE — Telephone Encounter (Signed)
[facility] is now [facility]. This order was faxed already. Will resend again today.

## 2020-04-18 NOTE — Telephone Encounter (Signed)
Patient is calling in this afternoon sating she spoke with Judson Roch yesterday about a power lift wheelchair, Aniyiah is needing a prescription sent to [facility], as the first was sent to  Place no longer in business. Fax number : [phone], asked if this could be sent in as soon as possible.

## 2020-04-21 ENCOUNTER — Inpatient Hospital Stay: Payer: Medicare Other

## 2020-04-21 ENCOUNTER — Other Ambulatory Visit: Payer: Self-pay

## 2020-04-21 ENCOUNTER — Other Ambulatory Visit: Payer: Self-pay | Admitting: Hematology

## 2020-04-21 ENCOUNTER — Telehealth: Payer: Self-pay | Admitting: Gastroenterology

## 2020-04-21 VITALS — BP 153/85 | HR 86 | Temp 98.9°F | Resp 18 | Wt 181.2 lb

## 2020-04-21 DIAGNOSIS — C9000 Multiple myeloma not having achieved remission: Secondary | ICD-10-CM

## 2020-04-21 DIAGNOSIS — Z7189 Other specified counseling: Secondary | ICD-10-CM

## 2020-04-21 DIAGNOSIS — Z5112 Encounter for antineoplastic immunotherapy: Secondary | ICD-10-CM | POA: Diagnosis not present

## 2020-04-21 DIAGNOSIS — Z923 Personal history of irradiation: Secondary | ICD-10-CM | POA: Diagnosis not present

## 2020-04-21 DIAGNOSIS — E1122 Type 2 diabetes mellitus with diabetic chronic kidney disease: Secondary | ICD-10-CM | POA: Diagnosis not present

## 2020-04-21 DIAGNOSIS — N189 Chronic kidney disease, unspecified: Secondary | ICD-10-CM | POA: Diagnosis not present

## 2020-04-21 DIAGNOSIS — Z87891 Personal history of nicotine dependence: Secondary | ICD-10-CM | POA: Diagnosis not present

## 2020-04-21 DIAGNOSIS — Z853 Personal history of malignant neoplasm of breast: Secondary | ICD-10-CM | POA: Diagnosis not present

## 2020-04-21 DIAGNOSIS — Z8673 Personal history of transient ischemic attack (TIA), and cerebral infarction without residual deficits: Secondary | ICD-10-CM | POA: Diagnosis not present

## 2020-04-21 DIAGNOSIS — E1140 Type 2 diabetes mellitus with diabetic neuropathy, unspecified: Secondary | ICD-10-CM | POA: Diagnosis not present

## 2020-04-21 DIAGNOSIS — I252 Old myocardial infarction: Secondary | ICD-10-CM | POA: Diagnosis not present

## 2020-04-21 LAB — CMP (CANCER CENTER ONLY)
ALT: 14 U/L (ref 0–44)
AST: 15 U/L (ref 15–41)
Albumin: 3 g/dL — ABNORMAL LOW (ref 3.5–5.0)
Alkaline Phosphatase: 94 U/L (ref 38–126)
Anion gap: 6 (ref 5–15)
BUN: 18 mg/dL (ref 8–23)
CO2: 24 mmol/L (ref 22–32)
Calcium: 8.1 mg/dL — ABNORMAL LOW (ref 8.9–10.3)
Chloride: 109 mmol/L (ref 98–111)
Creatinine: 1.42 mg/dL — ABNORMAL HIGH (ref 0.44–1.00)
GFR, Est AFR Am: 41 mL/min — ABNORMAL LOW (ref >60)
GFR, Estimated: 36 mL/min — ABNORMAL LOW (ref >60)
Glucose, Bld: 105 mg/dL — ABNORMAL HIGH (ref 70–99)
Potassium: 4 mmol/L (ref 3.5–5.1)
Sodium: 139 mmol/L (ref 135–145)
Total Bilirubin: <0.2 mg/dL — ABNORMAL LOW (ref 0.3–1.2)
Total Protein: 8.3 g/dL — ABNORMAL HIGH (ref 6.5–8.1)

## 2020-04-21 LAB — CBC WITH DIFFERENTIAL/PLATELET
Abs Immature Granulocytes: 0.02 10*3/uL (ref 0.00–0.07)
Basophils Absolute: 0 10*3/uL (ref 0.0–0.1)
Basophils Relative: 0 %
Eosinophils Absolute: 0 10*3/uL (ref 0.0–0.5)
Eosinophils Relative: 0 %
HCT: 23.2 % — ABNORMAL LOW (ref 36.0–46.0)
Hemoglobin: 7.7 g/dL — ABNORMAL LOW (ref 12.0–15.0)
Immature Granulocytes: 0 %
Lymphocytes Relative: 34 %
Lymphs Abs: 2 10*3/uL (ref 0.7–4.0)
MCH: 31.6 pg (ref 26.0–34.0)
MCHC: 33.2 g/dL (ref 30.0–36.0)
MCV: 95.1 fL (ref 80.0–100.0)
Monocytes Absolute: 0.3 10*3/uL (ref 0.1–1.0)
Monocytes Relative: 5 %
Neutro Abs: 3.6 10*3/uL (ref 1.7–7.7)
Neutrophils Relative %: 61 %
Platelets: 254 10*3/uL (ref 150–400)
RBC: 2.44 MIL/uL — ABNORMAL LOW (ref 3.87–5.11)
RDW: 15.1 % (ref 11.5–15.5)
WBC: 6 10*3/uL (ref 4.0–10.5)
nRBC: 0 % (ref 0.0–0.2)

## 2020-04-21 MED ORDER — ACETAMINOPHEN 325 MG PO TABS
ORAL_TABLET | ORAL | Status: AC
  Filled 2020-04-21: qty 2

## 2020-04-21 MED ORDER — SODIUM CHLORIDE 0.9 % IV SOLN
Freq: Once | INTRAVENOUS | Status: AC
  Filled 2020-04-21: qty 250

## 2020-04-21 MED ORDER — FAMOTIDINE IN NACL 20-0.9 MG/50ML-% IV SOLN
INTRAVENOUS | Status: AC
  Filled 2020-04-21: qty 50

## 2020-04-21 MED ORDER — DIPHENHYDRAMINE HCL 25 MG PO CAPS
ORAL_CAPSULE | ORAL | Status: AC
  Filled 2020-04-21: qty 2

## 2020-04-21 MED ORDER — DIPHENHYDRAMINE HCL 25 MG PO CAPS
50.0000 mg | ORAL_CAPSULE | Freq: Once | ORAL | Status: AC
  Administered 2020-04-21: 50 mg via ORAL

## 2020-04-21 MED ORDER — SODIUM CHLORIDE 0.9 % IV SOLN
20.0000 mg | Freq: Once | INTRAVENOUS | Status: AC
  Administered 2020-04-21: 20 mg via INTRAVENOUS
  Filled 2020-04-21: qty 20

## 2020-04-21 MED ORDER — SODIUM CHLORIDE 0.9 % IV SOLN
16.0000 mg/kg | Freq: Once | INTRAVENOUS | Status: AC
  Administered 2020-04-21: 1400 mg via INTRAVENOUS
  Filled 2020-04-21: qty 60

## 2020-04-21 MED ORDER — FAMOTIDINE IN NACL 20-0.9 MG/50ML-% IV SOLN
20.0000 mg | Freq: Once | INTRAVENOUS | Status: AC
  Administered 2020-04-21: 20 mg via INTRAVENOUS

## 2020-04-21 MED ORDER — MONTELUKAST SODIUM 10 MG PO TABS
ORAL_TABLET | ORAL | Status: AC
  Filled 2020-04-21: qty 1

## 2020-04-21 MED ORDER — ACETAMINOPHEN 325 MG PO TABS
650.0000 mg | ORAL_TABLET | Freq: Once | ORAL | Status: AC
  Administered 2020-04-21: 650 mg via ORAL

## 2020-04-21 MED ORDER — MONTELUKAST SODIUM 10 MG PO TABS
10.0000 mg | ORAL_TABLET | Freq: Once | ORAL | Status: AC
  Administered 2020-04-21: 10 mg via ORAL

## 2020-04-21 NOTE — Progress Notes (Signed)
Per Dr. Irene Limbo, okay for patient to proceed with treatment today with hemoglobin 7.7.

## 2020-04-21 NOTE — Patient Instructions (Signed)
[facility] Discharge Instructions for Patients Receiving Chemotherapy  Today you received the following chemotherapy agents: daratumumab.  To help prevent nausea and vomiting after your treatment, we encourage you to take your nausea medication as directed.   If you develop nausea and vomiting that is not controlled by your nausea medication, call the clinic.   BELOW ARE SYMPTOMS THAT SHOULD BE REPORTED IMMEDIATELY:  *FEVER GREATER THAN 100.5 F  *CHILLS WITH OR WITHOUT FEVER  NAUSEA AND VOMITING THAT IS NOT CONTROLLED WITH YOUR NAUSEA MEDICATION  *UNUSUAL SHORTNESS OF BREATH  *UNUSUAL BRUISING OR BLEEDING  TENDERNESS IN MOUTH AND THROAT WITH OR WITHOUT PRESENCE OF ULCERS  *URINARY PROBLEMS  *BOWEL PROBLEMS  UNUSUAL RASH Items with * indicate a potential emergency and should be followed up as soon as possible.  Feel free to call the clinic should you have any questions or concerns. The clinic phone number is [phone].  Please show the CHEMO ALERT CARD at check-in to the Emergency Department and triage nurse.   

## 2020-04-24 ENCOUNTER — Other Ambulatory Visit: Payer: Self-pay | Admitting: Radiology

## 2020-04-24 LAB — KAPPA/LAMBDA LIGHT CHAINS
Kappa free light chain: 15 mg/L (ref 3.3–19.4)
Kappa, lambda light chain ratio: 1.58 (ref 0.26–1.65)
Lambda free light chains: 9.5 mg/L (ref 5.7–26.3)

## 2020-04-24 NOTE — Telephone Encounter (Signed)
Patient aware that appointment date and time was changed to 05-16-20 at 4:00pm.  Patient advised to arrive at 3:00pm.  New set of prep instructions mailed to patient.  Patient agreed to plan and verbalized understanding.  No further questions.

## 2020-04-24 NOTE — Telephone Encounter (Signed)
Let's try for 10/19. Make sure [facility] is OK with that.  Timing-wise it should be fine because I have another double procedure that afternoon.  Thanks

## 2020-04-24 NOTE — Telephone Encounter (Signed)
Pt called in stating that [facility] is not in network with insurance, she wanted the script [facility] phone # [phone]   Fax # [phone]

## 2020-04-24 NOTE — Telephone Encounter (Signed)
DME faxed this morning.

## 2020-04-25 ENCOUNTER — Other Ambulatory Visit: Payer: Self-pay

## 2020-04-25 ENCOUNTER — Encounter (HOSPITAL_COMMUNITY): Payer: Self-pay

## 2020-04-25 ENCOUNTER — Observation Stay (HOSPITAL_COMMUNITY)
Admission: RE | Admit: 2020-04-25 | Discharge: 2020-04-25 | Disposition: A | Discharge status: Home / Self Care | Payer: Medicare Other | Source: Ambulatory Visit | Attending: Hematology | Admitting: Hematology

## 2020-04-25 ENCOUNTER — Ambulatory Visit (HOSPITAL_COMMUNITY)
Admission: RE | Admit: 2020-04-25 | Discharge: 2020-04-25 | Disposition: A | Discharge status: Home / Self Care | Payer: Medicare Other | Source: Ambulatory Visit | Attending: Hematology | Admitting: Hematology

## 2020-04-25 DIAGNOSIS — G2581 Restless legs syndrome: Secondary | ICD-10-CM | POA: Diagnosis not present

## 2020-04-25 DIAGNOSIS — F419 Anxiety disorder, unspecified: Secondary | ICD-10-CM | POA: Insufficient documentation

## 2020-04-25 DIAGNOSIS — Z91041 Radiographic dye allergy status: Secondary | ICD-10-CM | POA: Insufficient documentation

## 2020-04-25 DIAGNOSIS — K219 Gastro-esophageal reflux disease without esophagitis: Secondary | ICD-10-CM | POA: Diagnosis not present

## 2020-04-25 DIAGNOSIS — Z7982 Long term (current) use of aspirin: Secondary | ICD-10-CM | POA: Insufficient documentation

## 2020-04-25 DIAGNOSIS — Z923 Personal history of irradiation: Secondary | ICD-10-CM | POA: Insufficient documentation

## 2020-04-25 DIAGNOSIS — Z91040 Latex allergy status: Secondary | ICD-10-CM | POA: Insufficient documentation

## 2020-04-25 DIAGNOSIS — I10 Essential (primary) hypertension: Secondary | ICD-10-CM | POA: Insufficient documentation

## 2020-04-25 DIAGNOSIS — Z888 Allergy status to other drugs, medicaments and biological substances status: Secondary | ICD-10-CM | POA: Insufficient documentation

## 2020-04-25 DIAGNOSIS — Z881 Allergy status to other antibiotic agents status: Secondary | ICD-10-CM | POA: Diagnosis not present

## 2020-04-25 DIAGNOSIS — Z87891 Personal history of nicotine dependence: Secondary | ICD-10-CM | POA: Insufficient documentation

## 2020-04-25 DIAGNOSIS — C9000 Multiple myeloma not having achieved remission: Secondary | ICD-10-CM | POA: Diagnosis not present

## 2020-04-25 DIAGNOSIS — C9001 Multiple myeloma in remission: Secondary | ICD-10-CM | POA: Diagnosis not present

## 2020-04-25 DIAGNOSIS — I252 Old myocardial infarction: Secondary | ICD-10-CM | POA: Diagnosis not present

## 2020-04-25 DIAGNOSIS — Z853 Personal history of malignant neoplasm of breast: Secondary | ICD-10-CM | POA: Insufficient documentation

## 2020-04-25 DIAGNOSIS — Z452 Encounter for adjustment and management of vascular access device: Secondary | ICD-10-CM | POA: Diagnosis not present

## 2020-04-25 DIAGNOSIS — Z8673 Personal history of transient ischemic attack (TIA), and cerebral infarction without residual deficits: Secondary | ICD-10-CM | POA: Insufficient documentation

## 2020-04-25 DIAGNOSIS — E785 Hyperlipidemia, unspecified: Secondary | ICD-10-CM | POA: Diagnosis not present

## 2020-04-25 DIAGNOSIS — Z79899 Other long term (current) drug therapy: Secondary | ICD-10-CM | POA: Insufficient documentation

## 2020-04-25 HISTORY — PX: IR IMAGING GUIDED PORT INSERTION: IMG5740

## 2020-04-25 LAB — CBC WITH DIFFERENTIAL/PLATELET
Abs Immature Granulocytes: 0.03 10*3/uL (ref 0.00–0.07)
Basophils Absolute: 0 10*3/uL (ref 0.0–0.1)
Basophils Relative: 0 %
Eosinophils Absolute: 0 10*3/uL (ref 0.0–0.5)
Eosinophils Relative: 0 %
HCT: 26.3 % — ABNORMAL LOW (ref 36.0–46.0)
Hemoglobin: 8.7 g/dL — ABNORMAL LOW (ref 12.0–15.0)
Immature Granulocytes: 0 %
Lymphocytes Relative: 38 %
Lymphs Abs: 2.7 10*3/uL (ref 0.7–4.0)
MCH: 32.2 pg (ref 26.0–34.0)
MCHC: 33.1 g/dL (ref 30.0–36.0)
MCV: 97.4 fL (ref 80.0–100.0)
Monocytes Absolute: 0.4 10*3/uL (ref 0.1–1.0)
Monocytes Relative: 6 %
Neutro Abs: 3.9 10*3/uL (ref 1.7–7.7)
Neutrophils Relative %: 56 %
Platelets: 341 10*3/uL (ref 150–400)
RBC: 2.7 MIL/uL — ABNORMAL LOW (ref 3.87–5.11)
RDW: 15 % (ref 11.5–15.5)
WBC: 7.1 10*3/uL (ref 4.0–10.5)
nRBC: 0.4 % — ABNORMAL HIGH (ref 0.0–0.2)

## 2020-04-25 LAB — MULTIPLE MYELOMA PANEL, SERUM
Albumin SerPl Elph-Mcnc: 3.1 g/dL (ref 2.9–4.4)
Albumin/Glob SerPl: 0.7 (ref 0.7–1.7)
Alpha 1: 0.3 g/dL (ref 0.0–0.4)
Alpha2 Glob SerPl Elph-Mcnc: 1.4 g/dL — ABNORMAL HIGH (ref 0.4–1.0)
B-Globulin SerPl Elph-Mcnc: 1 g/dL (ref 0.7–1.3)
Gamma Glob SerPl Elph-Mcnc: 2 g/dL — ABNORMAL HIGH (ref 0.4–1.8)
Globulin, Total: 4.6 g/dL — ABNORMAL HIGH (ref 2.2–3.9)
IgA: 70 mg/dL (ref 64–422)
IgG (Immunoglobin G), Serum: 2573 mg/dL — ABNORMAL HIGH (ref 586–1602)
IgM (Immunoglobulin M), Srm: 54 mg/dL (ref 26–217)
M Protein SerPl Elph-Mcnc: 1.8 g/dL — ABNORMAL HIGH
Total Protein ELP: 7.7 g/dL (ref 6.0–8.5)

## 2020-04-25 LAB — PROTIME-INR
INR: 1.2 (ref 0.8–1.2)
Prothrombin Time: 14.4 seconds (ref 11.4–15.2)

## 2020-04-25 MED ORDER — LIDOCAINE-EPINEPHRINE 1 %-1:100000 IJ SOLN
INTRAMUSCULAR | Status: AC
  Filled 2020-04-25: qty 1

## 2020-04-25 MED ORDER — SODIUM CHLORIDE 0.9 % IV SOLN: INTRAVENOUS | Status: DC

## 2020-04-25 MED ORDER — FENTANYL CITRATE (PF) 100 MCG/2ML IJ SOLN
INTRAMUSCULAR | Status: DC | PRN
Start: 2020-04-25 — End: 2020-04-26
  Administered 2020-04-25 (×2): 50 ug via INTRAVENOUS

## 2020-04-25 MED ORDER — CLINDAMYCIN PHOSPHATE 900 MG/50ML IV SOLN
900.0000 mg | Freq: Once | INTRAVENOUS | Status: AC
  Administered 2020-04-25: 900 mg via INTRAVENOUS

## 2020-04-25 MED ORDER — HEPARIN SOD (PORK) LOCK FLUSH 100 UNIT/ML IV SOLN
INTRAVENOUS | Status: AC
  Filled 2020-04-25: qty 5

## 2020-04-25 MED ORDER — MIDAZOLAM HCL 2 MG/2ML IJ SOLN
INTRAMUSCULAR | Status: DC | PRN
  Administered 2020-04-25 (×3): 1 mg via INTRAVENOUS

## 2020-04-25 MED ORDER — CLINDAMYCIN PHOSPHATE 900 MG/50ML IV SOLN
INTRAVENOUS | Status: AC
  Filled 2020-04-25: qty 50

## 2020-04-25 MED ORDER — LIDOCAINE-EPINEPHRINE 1 %-1:100000 IJ SOLN
INTRAMUSCULAR | Status: DC | PRN
  Administered 2020-04-25: 20 mL

## 2020-04-25 MED ORDER — FENTANYL CITRATE (PF) 100 MCG/2ML IJ SOLN
INTRAMUSCULAR | Status: AC
  Filled 2020-04-25: qty 2

## 2020-04-25 MED ORDER — MIDAZOLAM HCL 2 MG/2ML IJ SOLN
INTRAMUSCULAR | Status: AC
  Filled 2020-04-25: qty 4

## 2020-04-25 NOTE — Discharge Instructions (Signed)
Do not use lidocaine cream or any creams over your new port until it has healed. The petroleum in these products will dissolve the skin glue and the skin will come apart. This will result in an infection over your new port. Use ice in a zip lock bag for 1-2 minutes prior to the nurses accessing your new port.   Implanted Port Insertion, Care After This sheet gives you information about how to care for yourself after your procedure. Your health care provider may also give you more specific instructions. If you have problems or questions, contact your health care provider. What can I expect after the procedure? After the procedure, it is common to have:  Discomfort at the port insertion site.  Bruising on the skin over the port. This should improve over 3-4 days. Follow these instructions at home: [facility] care  After your port is placed, you will get a manufacturer's information card. The card has information about your port. Keep this card with you at all times.  Take care of the port as told by your health care provider. Ask your health care provider if you or a family member can get training for taking care of the port at home. A home health care nurse may also take care of the port.  Make sure to remember what type of port you have. Incision care      Follow instructions from your health care provider about how to take care of your port insertion site. Make sure you: ? Wash your hands with soap and water before and after you change your bandage (dressing). If soap and water are not available, use hand sanitizer. ? Change your dressing as told by your health care provider.  Leave  skin glue in place. These skin closures may need to stay in place for 2 weeks or longer.  Check your port insertion site every day for signs of infection. Check for: ? Redness, swelling, or pain. ? Fluid or blood. ? Warmth. ? Pus or a bad smell. Activity  Return to your normal activities as told by your  health care provider. Ask your health care provider what activities are safe for you.  Do not lift anything that is heavier than 10 lb (4.5 kg), or the limit that you are told, until your health care provider says that it is safe. General instructions  Take over-the-counter and prescription medicines only as told by your health care provider.  Do not take baths, swim, or use a hot tub until your health care provider approves. You may shower tomorrow.  Do not drive for 24 hours if you were given a sedative during your procedure.  Wear a medical alert bracelet in case of an emergency. This will tell any health care providers that you have a port.  Keep all follow-up visits as told by your health care provider. This is important. Contact a health care provider if:  You cannot flush your port with saline as directed, or you cannot draw blood from the port.  You have a fever or chills.  You have redness, swelling, or pain around your port insertion site.  You have fluid or blood coming from your port insertion site.  Your port insertion site feels warm to the touch.  You have pus or a bad smell coming from the port insertion site. Get help right away if:  You have chest pain or shortness of breath.  You have bleeding from your port that you cannot control. Summary  Take care of the port as told by your health care provider. Keep the manufacturer's information card with you at all times.  Change your dressing as told by your health care provider.  Contact a health care provider if you have a fever or chills or if you have redness, swelling, or pain around your port insertion site.  Keep all follow-up visits as told by your health care provider. This information is not intended to replace advice given to you by your health care provider. Make sure you discuss any questions you have with your health care provider. Document Revised: 02/10/2018 Document Reviewed: 02/10/2018 Elsevier  Patient Education  [place].     Moderate Conscious Sedation, Adult, Care After These instructions provide you with information about caring for yourself after your procedure. Your health care provider may also give you more specific instructions. Your treatment has been planned according to current medical practices, but problems sometimes occur. Call your health care provider if you have any problems or questions after your procedure. What can I expect after the procedure? After your procedure, it is common:  To feel sleepy for several hours.  To feel clumsy and have poor balance for several hours.  To have poor judgment for several hours.  To vomit if you eat too soon. Follow these instructions at home: For at least 24 hours after the procedure:   Do not: ? Participate in activities where you could fall or become injured. ? Drive. ? Use heavy machinery. ? Drink alcohol. ? Take sleeping pills or medicines that cause drowsiness. ? Make important decisions or sign legal documents. ? Take care of children on your own.  Rest. Eating and drinking  Follow the diet recommended by your health care provider.  If you vomit: ? Drink water, juice, or soup when you can drink without vomiting. ? Make sure you have little or no nausea before eating solid foods. General instructions  Have a responsible adult stay with you until you are awake and alert.  Take over-the-counter and prescription medicines only as told by your health care provider.  If you smoke, do not smoke without supervision.  Keep all follow-up visits as told by your health care provider. This is important. Contact a health care provider if:  You keep feeling nauseous or you keep vomiting.  You feel light-headed.  You develop a rash.  You have a fever. Get help right away if:  You have trouble breathing. This information is not intended to replace advice given to you by your health care  provider. Make sure you discuss any questions you have with your health care provider. Document Revised: 06/27/2017 Document Reviewed: 11/04/2015 Elsevier Patient Education  2020 [employer].

## 2020-04-25 NOTE — Procedures (Signed)
Interventional Radiology Procedure Note  Procedure: Placement of a right IJ approach single lumen PowerPort.  Tip is positioned at the superior cavoatrial junction and catheter is ready for immediate use.  Complications: No immediate Recommendations:  - Ok to shower tomorrow - Do not submerge for 7 days - Routine line care   Signed,  Tarl Cephas K. Ladarrell Cornwall, MD   

## 2020-04-25 NOTE — H&P (Signed)
Chief Complaint: Patient was seen in consultation today for port-a-catheter placement  Referring Physician(s): [name]  Supervising Physician: Jacqulynn Cadet  Patient Status: [facility] - Out-pt  History of Present Illness: Tracy [name] is a 74 y.o. female with a medical history significant for HTN, MI, stroke and bilateral breast cancer (in remission; hx of port-a-catheter). She presented to her pulmonologist in June 2021 with persistent cough and poorly controlled acid reflux. CT imaging revealed lytic lesions in the pelvis and vertebral body. On 01/17/20 she underwent a bone marrow biopsy and aspiration as well as a biopsy of the left pelvic lytic lesion. Pathology was positive for plasma cell neoplasm/multiple myeloma. She has been receiving daratumumab since July 2021 but has not achieved remission yet. Her oncology team is now preparing her for IV Carfilzomib therapy.    Interventional Radiology has been asked to evaluate this patient for an image-guided port-a-catheter placement to facilitate her treatment plans.   Past Medical History:  Diagnosis Date  . Anxiety   . Arthritis   . Breast cancer ([place]) 06/13/15  . Cancer ([place]) 2000   breast cancer  . Chronic bronchitis ([place])   . Chronic bronchitis ([place])   . Hyperlipidemia   . Hypertension   . Myocardial infarction ([place]) 2001  . Personal history of radiation therapy   . Restless leg   . Stroke [facility]) 2004   TIA, no deficits    Past Surgical History:  Procedure Laterality Date  . ABDOMINAL HYSTERECTOMY  1985  . BREAST LUMPECTOMY Left 2000   radiation and chemo  . BREAST LUMPECTOMY Right 2016   radiation  . BREAST SURGERY  2001   lt breast lumpectomy  . RADIOACTIVE SEED GUIDED PARTIAL MASTECTOMY WITH AXILLARY SENTINEL LYMPH NODE BIOPSY Right 07/07/2015   Procedure: RIGHT RADIOACTIVE SEED GUIDED PARTIAL MASTECTOMY WITH AXILLARY SENTINEL LYMPH NODE BIOPSY;  Surgeon: Autumn Messing III, MD;  Location: [facility];  Service: General;  Laterality: Right;  . SMALL INTESTINE SURGERY    . TUBAL LIGATION      Allergies: Bacitracin-neomycin-polymyxin  [neomycin-bacitracin zn-polymyx], Nsaids, Tape, Ambien [zolpidem tartrate], Amoxicillin, Contrast media [iodinated diagnostic agents], Latex, and Prednisone  Medications: Prior to Admission medications   Medication Sig Start Date End Date Taking? Authorizing Provider  albuterol (VENTOLIN HFA) 108 (90 Base) MCG/ACT inhaler Inhale 2 puffs into the lungs every 6 (six) hours as needed for wheezing or shortness of breath. 03/17/20  Yes Tanner, Lyndon Code., PA-C  aspirin 81 MG tablet Take 81 mg by mouth daily.   Yes [provider]  atorvastatin (LIPITOR) 10 MG tablet TAKE 1 TABLET BY MOUTH  DAILY 04/04/20  Yes [name], MD  ferrous sulfate 325 (65 FE) MG tablet Take 1 tablet (325 mg total) by mouth daily with breakfast. 01/20/18  Yes [name], MD  fluticasone (FLOVENT HFA) 110 MCG/ACT inhaler Inhale 2 puffs into the lungs in the morning and at bedtime. 03/17/20  Yes Tanner, Lucianne Lei E., PA-C  gabapentin (NEURONTIN) 300 MG capsule TAKE 1 CAPSULE BY MOUTH 3  TIMES DAILY 02/28/20  Yes [name], MD  HYDROcodone-acetaminophen (NORCO) 5-325 MG tablet Take 1 tablet by mouth every 6 (six) hours as needed for moderate pain or severe pain. 02/21/20  Yes [name], MD  loratadine (CLARITIN) 10 MG tablet Take 10 mg by mouth daily.   Yes [provider]  losartan (COZAAR) 100 MG tablet TAKE 1 TABLET BY MOUTH  DAILY 04/04/20  Yes  [name], MD  MELATONIN PO Take by mouth.    Yes [provider]  metFORMIN (GLUCOPHAGE) 500 MG tablet TAKE 1 TABLET(500 MG) BY MOUTH TWICE DAILY WITH A MEAL 04/17/20  Yes [name], MD  mometasone (NASONEX) 50 MCG/ACT nasal spray Place 2 sprays into the nose daily. 03/16/20 03/16/21 Yes [name], MD  pantoprazole (PROTONIX) 40 MG tablet TAKE 1 TABLET(40 MG) BY  MOUTH TWICE DAILY 04/04/20  Yes [name], MD  sertraline (ZOLOFT) 50 MG tablet TAKE 1 TABLET(50 MG) BY MOUTH DAILY 04/13/20  Yes [name], MD  Accu-Chek Softclix Lancets lancets Use as instructed to check sugars 1-2 times daily. 02/10/20   [name], MD  acyclovir (ZOVIRAX) 400 MG tablet Take 1 tablet (400 mg total) by mouth 2 (two) times daily. Patient not taking: Reported on 04/13/2020 02/07/20   [name], MD  dexamethasone (DECADRON) 4 MG tablet Take 3 tabs with breakfast the day after each treatment. Patient not taking: Reported on 04/13/2020 02/07/20   [name], MD  ergocalciferol (VITAMIN D2) 1.25 MG (50000 UT) capsule Take 1 capsule (50,000 Units total) by mouth once a week. 03/24/20   [name], MD  glucose blood (ACCU-CHEK GUIDE) test strip Use as instructed to check sugars 1-2 times daily. 02/10/20   [name], MD  LORazepam (ATIVAN) 0.5 MG tablet Take 1 tablet (0.5 mg total) by mouth every 6 (six) hours as needed (Nausea or vomiting). Patient not taking: Reported on 04/13/2020 02/07/20   [name], MD  Na Sulfate-K Sulfate-Mg Sulf (SUPREP BOWEL PREP KIT) 17.5-3.13-1.6 GM/177ML SOLN Take 1 kit by mouth as directed. Patient not taking: Reported on 04/13/2020 04/05/20   Milus Banister, MD  ondansetron (ZOFRAN) 8 MG tablet Take 1 tablet (8 mg total) by mouth 2 (two) times daily as needed (Nausea or vomiting). Patient not taking: Reported on 04/13/2020 02/07/20   [name], MD  prochlorperazine (COMPAZINE) 10 MG tablet Take 1 tablet (10 mg total) by mouth every 6 (six) hours as needed (Nausea or vomiting). Patient not taking: Reported on 04/13/2020 02/07/20   [name], MD  tiZANidine (ZANAFLEX) 4 MG tablet TAKE 1 TABLET(4 MG) BY MOUTH AT BEDTIME Patient not taking: Reported on 04/13/2020 03/14/20   [name], MD  traZODone (DESYREL) 100 MG tablet TAKE 1 TABLET BY MOUTH AT  BEDTIME Patient  not taking: Reported on 04/13/2020 12/09/19   [name], MD     Family History  Problem Relation Age of Onset  . Diabetes Father   . Lung cancer Sister        dx. <50; former smoker  . Diabetes Brother   . Diabetes Paternal Aunt   . Stroke Maternal Grandmother   . Diabetes Paternal Grandmother   . Emphysema Mother 78       smoker  . Diabetes Brother   . Brain cancer Brother 6       unknown tumor type  . Cancer Daughter 57       neck cancer  . Other Daughter        hysterectomy for unspecified reason  . Breast cancer Cousin   . Cancer Cousin        unspecified type  . Breast cancer Other        triple negative breast cancer in her 25s  . Cancer Daughter   . Colon polyps Neg Hx   . Esophageal cancer  Neg Hx   . Gallbladder disease Neg Hx     Social History   Socioeconomic History  . Marital status: Divorced    Spouse name: Not on file  . Number of children: 7  . Years of education: Not on file  . Highest education level: Not on file  Occupational History  . Occupation: retired  Tobacco Use  . Smoking status: Former Smoker    Packs/day: 1.00    Years: 20.00    Pack years: 20.00    Types: Cigarettes    Quit date: 07/30/2011    Years since quitting: 8.7  . Smokeless tobacco: Never Used  . Tobacco comment: Quit >4 years ago; 1 ppd for about 5/20 years (remaining was less)  Vaping Use  . Vaping Use: Former  Substance and Sexual Activity  . Alcohol use: No    Alcohol/week: 0.0 standard drinks  . Drug use: No  . Sexual activity: Not Currently  Other Topics Concern  . Not on file  Social History Narrative   Lives alone.  Retired.  Education:  11th grade GED.  Children:  7 (one here).    Social Determinants of Health   Financial Resource Strain:   . Difficulty of Paying Living Expenses: Not on file  Food Insecurity: No Food Insecurity  . Worried About Charity fundraiser in the Last Year: Never true  . Ran Out of Food in the Last Year: Never true    Transportation Needs: No Transportation Needs  . Lack of Transportation (Medical): No  . Lack of Transportation (Non-Medical): No  Physical Activity:   . Days of Exercise per Week: Not on file  . Minutes of Exercise per Session: Not on file  Stress:   . Feeling of Stress : Not on file  Social Connections:   . Frequency of Communication with Friends and Family: Not on file  . Frequency of Social Gatherings with Friends and Family: Not on file  . Attends Religious Services: Not on file  . Active Member of Clubs or Organizations: Not on file  . Attends Archivist Meetings: Not on file  . Marital Status: Not on file    Review of Systems: A 12 point ROS discussed and pertinent positives are indicated in the HPI above.  All other systems are negative.  Review of Systems  Constitutional: Positive for fatigue. Negative for appetite change.  Respiratory: Negative for cough and shortness of breath.   Cardiovascular: Negative for chest pain.  Gastrointestinal: Positive for nausea. Negative for abdominal pain, diarrhea and vomiting.  Musculoskeletal: Negative for back pain.  Neurological: Positive for weakness. Negative for headaches.    Vital Signs: BP (!) 186/78 (BP Location: Right Arm)   Pulse 76   Temp 99 F (37.2 C) (Oral)   Resp 17   SpO2 96%   Physical Exam Constitutional:      General: She is not in acute distress.    Appearance: She is obese.  HENT:     Mouth/Throat:     Mouth: Mucous membranes are moist.     Pharynx: Oropharynx is clear.  Cardiovascular:     Rate and Rhythm: Normal rate and regular rhythm.     Pulses: Normal pulses.     Heart sounds: Normal heart sounds.     Comments: Right upper chest scar from prior port-a-catheter/removal.  Pulmonary:     Effort: Pulmonary effort is normal.     Breath sounds: Normal breath sounds.  Abdominal:  General: Abdomen is protuberant. Bowel sounds are normal.     Palpations: Abdomen is soft.  Skin:     General: Skin is warm and dry.  Neurological:     Mental Status: She is alert and oriented to person, place, and time.     Imaging: No results found.  Labs:  CBC: Recent Labs    03/29/20 1017 04/07/20 0805 04/21/20 1024 04/25/20 1200  WBC 4.9 5.8 6.0 7.1  HGB 9.9* 9.7* 7.7* 8.7*  HCT 30.4* 29.6* 23.2* 26.3*  PLT 233 212 254 341    COAGS: Recent Labs    04/25/20 1200  INR 1.2    BMP: Recent Labs    03/24/20 1032 03/29/20 1017 04/07/20 0805 04/21/20 1024  NA 136 139 137 139  K 3.5 3.8 4.1 4.0  CL 106 105 108 109  CO2 23 25 21* 24  GLUCOSE 113* 139* 112* 105*  BUN '9 15 16 18  ' CALCIUM 8.3* 9.3 8.9 8.1*  CREATININE 0.93 1.04* 1.13* 1.42*  GFRNONAA >60 52* 47* 36*  GFRAA >60 >60 55* 41*    LIVER FUNCTION TESTS: Recent Labs    03/24/20 1032 03/29/20 1017 04/07/20 0805 04/21/20 1024  BILITOT <0.1* 0.3 <0.2* <0.2*  AST 14* 8* 10* 15  ALT '14 10 11 14  ' ALKPHOS 85 98 94 94  PROT 8.2* 8.2* 8.4* 8.3*  ALBUMIN 3.0* 2.8* 3.0* 3.0*    TUMOR MARKERS: No results for input(s): AFPTM, CEA, CA199, CHROMGRNA in the last 8760 hours.  Assessment and Plan:  Multiple myeloma: Doyce A. Gillispie, 74 year old female, presents today to the [facility] department for an image-guided port-a-catheter placement.   Risks and benefits of image-guided port-a-catheter placement were discussed with the patient including, but not limited to bleeding, infection, pneumothorax, or fibrin sheath development and need for additional procedures.  All of the patient's questions were answered, patient is agreeable to proceed.  She has been NPO. Labs and vitals have been reviewed. She does not take any blood thinning medications.   Consent signed and in chart.  Thank you for this interesting consult.  I greatly enjoyed meeting Laurence A Sazama and look forward to participating in their care.  A copy of this report was sent to the requesting provider on this  date.  Electronically Signed: Soyla Dryer, AGACNP-BC [phone] 04/25/2020, 1:08 PM   I spent a total of  30 Minutes   in face to face in clinical consultation, greater than 50% of which was counseling/coordinating care for port-a-catheter placement.

## 2020-04-26 NOTE — Telephone Encounter (Signed)
Patient called back and is still trying to get the lift chair - everywhere we have tried is not in network - can you send the order to [facility] - [address] [place] - fax # [phone].  Please send to attention:  Jeanette Caprice

## 2020-04-26 NOTE — Telephone Encounter (Signed)
Paperwork faxed. IF pt calls back again she MUST call her insurance company and find out where they recommend paperwork be faxed.

## 2020-04-28 DIAGNOSIS — R6889 Other general symptoms and signs: Secondary | ICD-10-CM | POA: Diagnosis not present

## 2020-04-28 HISTORY — PX: COLONOSCOPY WITH ESOPHAGOGASTRODUODENOSCOPY (EGD): SHX5779

## 2020-05-05 ENCOUNTER — Inpatient Hospital Stay: Payer: Medicare Other | Admitting: Hematology

## 2020-05-05 ENCOUNTER — Inpatient Hospital Stay: Payer: Medicare Other

## 2020-05-05 ENCOUNTER — Other Ambulatory Visit: Payer: Self-pay

## 2020-05-05 ENCOUNTER — Inpatient Hospital Stay: Payer: Medicare Other | Attending: Hematology

## 2020-05-05 VITALS — BP 164/77 | HR 75 | Temp 98.8°F | Resp 18

## 2020-05-05 VITALS — BP 158/74 | HR 77 | Temp 96.7°F | Resp 18 | Ht 60.0 in | Wt 185.1 lb

## 2020-05-05 DIAGNOSIS — C9000 Multiple myeloma not having achieved remission: Secondary | ICD-10-CM

## 2020-05-05 DIAGNOSIS — N189 Chronic kidney disease, unspecified: Secondary | ICD-10-CM | POA: Diagnosis not present

## 2020-05-05 DIAGNOSIS — E1140 Type 2 diabetes mellitus with diabetic neuropathy, unspecified: Secondary | ICD-10-CM | POA: Diagnosis not present

## 2020-05-05 DIAGNOSIS — D509 Iron deficiency anemia, unspecified: Secondary | ICD-10-CM

## 2020-05-05 DIAGNOSIS — Z5112 Encounter for antineoplastic immunotherapy: Secondary | ICD-10-CM | POA: Diagnosis not present

## 2020-05-05 DIAGNOSIS — E1122 Type 2 diabetes mellitus with diabetic chronic kidney disease: Secondary | ICD-10-CM | POA: Insufficient documentation

## 2020-05-05 DIAGNOSIS — Z7189 Other specified counseling: Secondary | ICD-10-CM

## 2020-05-05 DIAGNOSIS — Z8673 Personal history of transient ischemic attack (TIA), and cerebral infarction without residual deficits: Secondary | ICD-10-CM | POA: Insufficient documentation

## 2020-05-05 DIAGNOSIS — Z87891 Personal history of nicotine dependence: Secondary | ICD-10-CM | POA: Diagnosis not present

## 2020-05-05 DIAGNOSIS — Z853 Personal history of malignant neoplasm of breast: Secondary | ICD-10-CM | POA: Diagnosis not present

## 2020-05-05 DIAGNOSIS — Z923 Personal history of irradiation: Secondary | ICD-10-CM | POA: Diagnosis not present

## 2020-05-05 DIAGNOSIS — I252 Old myocardial infarction: Secondary | ICD-10-CM | POA: Insufficient documentation

## 2020-05-05 LAB — CBC WITH DIFFERENTIAL/PLATELET
Abs Immature Granulocytes: 0.01 10*3/uL (ref 0.00–0.07)
Basophils Absolute: 0 10*3/uL (ref 0.0–0.1)
Basophils Relative: 0 %
Eosinophils Absolute: 0 10*3/uL (ref 0.0–0.5)
Eosinophils Relative: 1 %
HCT: 25.2 % — ABNORMAL LOW (ref 36.0–46.0)
Hemoglobin: 8.4 g/dL — ABNORMAL LOW (ref 12.0–15.0)
Immature Granulocytes: 0 %
Lymphocytes Relative: 35 %
Lymphs Abs: 2.3 10*3/uL (ref 0.7–4.0)
MCH: 32.1 pg (ref 26.0–34.0)
MCHC: 33.3 g/dL (ref 30.0–36.0)
MCV: 96.2 fL (ref 80.0–100.0)
Monocytes Absolute: 0.4 10*3/uL (ref 0.1–1.0)
Monocytes Relative: 5 %
Neutro Abs: 3.9 10*3/uL (ref 1.7–7.7)
Neutrophils Relative %: 59 %
Platelets: 276 10*3/uL (ref 150–400)
RBC: 2.62 MIL/uL — ABNORMAL LOW (ref 3.87–5.11)
RDW: 15.3 % (ref 11.5–15.5)
WBC: 6.6 10*3/uL (ref 4.0–10.5)
nRBC: 0 % (ref 0.0–0.2)

## 2020-05-05 LAB — CMP (CANCER CENTER ONLY)
ALT: 8 U/L (ref 0–44)
AST: 11 U/L — ABNORMAL LOW (ref 15–41)
Albumin: 3 g/dL — ABNORMAL LOW (ref 3.5–5.0)
Alkaline Phosphatase: 91 U/L (ref 38–126)
Anion gap: 8 (ref 5–15)
BUN: 12 mg/dL (ref 8–23)
CO2: 24 mmol/L (ref 22–32)
Calcium: 9 mg/dL (ref 8.9–10.3)
Chloride: 106 mmol/L (ref 98–111)
Creatinine: 1.05 mg/dL — ABNORMAL HIGH (ref 0.44–1.00)
GFR, Estimated: 52 mL/min — ABNORMAL LOW (ref >60)
Glucose, Bld: 135 mg/dL — ABNORMAL HIGH (ref 70–99)
Potassium: 4 mmol/L (ref 3.5–5.1)
Sodium: 138 mmol/L (ref 135–145)
Total Bilirubin: 0.2 mg/dL — ABNORMAL LOW (ref 0.3–1.2)
Total Protein: 8.1 g/dL (ref 6.5–8.1)

## 2020-05-05 MED ORDER — ACETAMINOPHEN 325 MG PO TABS
ORAL_TABLET | ORAL | Status: AC
  Filled 2020-05-05: qty 2

## 2020-05-05 MED ORDER — FAMOTIDINE IN NACL 20-0.9 MG/50ML-% IV SOLN
INTRAVENOUS | Status: AC
  Filled 2020-05-05: qty 50

## 2020-05-05 MED ORDER — SODIUM CHLORIDE 0.9% FLUSH
10.0000 mL | INTRAVENOUS | Status: DC | PRN
  Administered 2020-05-05: 10 mL
  Filled 2020-05-05: qty 10

## 2020-05-05 MED ORDER — ZOLEDRONIC ACID 4 MG/100ML IV SOLN
4.0000 mg | Freq: Once | INTRAVENOUS | Status: AC
  Administered 2020-05-05: 4 mg via INTRAVENOUS

## 2020-05-05 MED ORDER — SODIUM CHLORIDE 0.9 % IV SOLN
20.0000 mg | Freq: Once | INTRAVENOUS | Status: AC
  Administered 2020-05-05: 20 mg via INTRAVENOUS
  Filled 2020-05-05: qty 20

## 2020-05-05 MED ORDER — SODIUM CHLORIDE 0.9% FLUSH
10.0000 mL | Freq: Once | INTRAVENOUS | Status: AC | PRN
  Administered 2020-05-05: 10 mL
  Filled 2020-05-05: qty 10

## 2020-05-05 MED ORDER — HEPARIN SOD (PORK) LOCK FLUSH 100 UNIT/ML IV SOLN
500.0000 [IU] | Freq: Once | INTRAVENOUS | Status: AC | PRN
  Administered 2020-05-05: 500 [IU]
  Filled 2020-05-05: qty 5

## 2020-05-05 MED ORDER — ACETAMINOPHEN 325 MG PO TABS
650.0000 mg | ORAL_TABLET | Freq: Once | ORAL | Status: AC
  Administered 2020-05-05: 650 mg via ORAL

## 2020-05-05 MED ORDER — DIPHENHYDRAMINE HCL 25 MG PO CAPS
ORAL_CAPSULE | ORAL | Status: AC
  Filled 2020-05-05: qty 2

## 2020-05-05 MED ORDER — DIPHENHYDRAMINE HCL 25 MG PO CAPS
50.0000 mg | ORAL_CAPSULE | Freq: Once | ORAL | Status: AC
  Administered 2020-05-05: 50 mg via ORAL

## 2020-05-05 MED ORDER — ZOLEDRONIC ACID 4 MG/100ML IV SOLN
INTRAVENOUS | Status: AC
  Filled 2020-05-05: qty 100

## 2020-05-05 MED ORDER — SODIUM CHLORIDE 0.9 % IV SOLN
Freq: Once | INTRAVENOUS | Status: AC
  Filled 2020-05-05: qty 250

## 2020-05-05 MED ORDER — FAMOTIDINE IN NACL 20-0.9 MG/50ML-% IV SOLN
20.0000 mg | Freq: Once | INTRAVENOUS | Status: AC
  Administered 2020-05-05: 20 mg via INTRAVENOUS

## 2020-05-05 MED ORDER — MONTELUKAST SODIUM 10 MG PO TABS
10.0000 mg | ORAL_TABLET | Freq: Once | ORAL | Status: AC
  Administered 2020-05-05: 10 mg via ORAL

## 2020-05-05 MED ORDER — SODIUM CHLORIDE 0.9 % IV SOLN
16.0000 mg/kg | Freq: Once | INTRAVENOUS | Status: AC
  Administered 2020-05-05: 1400 mg via INTRAVENOUS
  Filled 2020-05-05: qty 60

## 2020-05-05 MED ORDER — MONTELUKAST SODIUM 10 MG PO TABS
ORAL_TABLET | ORAL | Status: AC
  Filled 2020-05-05: qty 1

## 2020-05-05 NOTE — Patient Instructions (Signed)
[facility] Discharge Instructions for Patients Receiving Chemotherapy  Today you received the following chemotherapy agents: Darzalex  To help prevent nausea and vomiting after your treatment, we encourage you to take your nausea medication as directed.    If you develop nausea and vomiting that is not controlled by your nausea medication, call the clinic.   BELOW ARE SYMPTOMS THAT SHOULD BE REPORTED IMMEDIATELY:  *FEVER GREATER THAN 100.5 F  *CHILLS WITH OR WITHOUT FEVER  NAUSEA AND VOMITING THAT IS NOT CONTROLLED WITH YOUR NAUSEA MEDICATION  *UNUSUAL SHORTNESS OF BREATH  *UNUSUAL BRUISING OR BLEEDING  TENDERNESS IN MOUTH AND THROAT WITH OR WITHOUT PRESENCE OF ULCERS  *URINARY PROBLEMS  *BOWEL PROBLEMS  UNUSUAL RASH Items with * indicate a potential emergency and should be followed up as soon as possible.  Feel free to call the clinic should you have any questions or concerns. The clinic phone number is [phone].  Please show the CHEMO ALERT CARD at check-in to the Emergency Department and triage nurse.   

## 2020-05-05 NOTE — Progress Notes (Signed)
HEMATOLOGY/ONCOLOGY CLINIC NOTE  Date of Service: 05/05/2020  Patient Care Team: Tracy Minium, MD as PCP - General (Family Medicine) Tracy Glasgow, MD as Attending Physician (Physical Medicine and Rehabilitation) Tracy Nakayama, MD as Consulting Physician (Orthopedic Surgery) Tracy Quitter, MD as Consulting Physician (Otolaryngology) Tracy Campbell, MD as Consulting Physician (Gastroenterology) Tracy Kussmaul, MD as Consulting Physician (General Surgery) Tracy Lose, MD as Consulting Physician (Hematology and Oncology) Tracy Silversmith, MD as Consulting Physician (Radiation Oncology) Tracy Cheese, NP as Nurse Practitioner (Hematology and Oncology) Tracy [name], [facility] as Pharmacist (Pharmacist)  CHIEF COMPLAINTS/PURPOSE OF CONSULTATION:  Multiple Myeloma not having achieved remission  HISTORY OF PRESENTING ILLNESS:  Tracy [name] is a wonderful 74 y.o. female who has been referred to [place] by Dr [name] for evaluation and management of Multiple Myeloma. Pt is accompanied today by her daughter in person and other daughter and granddaughter via phone. The pt reports that she is doing well overall.   When pt was first diagnosed with Breast Cancer it was localized in both of her breasts and was considered Stage 1. She then received chemotherapy. However, she declined chemotherapy a second time and chose radiation therapy after recurrence. Pt has been under the surveillance of Dr. Nicholas [name] after declining antiestrogen therapy.   The pt reports that she was having difficulty breathing so she saw her Pulmonologist, who ordered the work up. Based on the results of the PET/CT pt was sent to Dr. [name] as they were concerned that her breast cancer had spread. Pt has been becoming increasingly anemic over the last year, despite using a PO Iron supplement. She has had a positive Fecal Occult tests, but has been having issues with hemorrhoids and hemorrhoidal bleeding for the last  5-6 months. Pt denies any blood in her stools but notices blood on the tissue after she wipes. Pt has also been having some discomfort in her left hip and lower back. This pain is worsened when she sits down.   Pt has had a chronic cough that has been thought to either be Bronchitis or a result of acid reflux. Pt is planning to receive an Endoscopy to work this up further. She is currently taking Gabapentin for tingling/burning in her extremities. Pt has Type II Diabetes and was previously using Metformin, but discontinued due to concern for liver damage. She is not currently using any medications to control her Diabetes.   Pt had a heart attack in 2001. She initially though that she had a [employer], until the sensation began to travel up her body. She was given Nitroglycerin upon admittance to the hospital. Pt did not require any stents or other interventions. No cause for her heart attack was ever discovered. Pt has been seen by Neurology, Dr. Leta Baptist, who completed imaging on her neck and found a degeneratve disk. Pt has not had a nerve study conducted. Pt has no history of Shingles and has not received the Shingles vaccine. She has been fully vaccinated against he COVID19 virus.    Of note prior to the patient's visit today, pt has had PET/CT ([id number]) completed on 01/20/2020 with results revealing "1. Hypermetabolism corresponding to left acetabular and L5 lytic lesions, with differential considerations of metastatic disease or myeloma. 2. No evidence of hypermetabolic soft tissue primary, metastatic disease, or soft tissue myeloma. 3. Incidental findings, including: Aortic atherosclerosis (ICD10-I70.0) and emphysema (ICD10-J43.9). Hepatic steatosis."   Pt has had Bone Marrow Surgical Pathology ([id number]) completed on 01/17/2020 which revealed "BONE  MARROW, ASPIRATE, CLOT, CORE: -Hypercellular bone marrow for age with plasma cell neoplasm PERIPHERAL BLOOD: -Normocytic-normochromic  anemia"  Pt has had Left Ischium Biopsy Surgical Pathology Report [id number]) completed on 01/17/2020 which revealed "Plasma cell neoplasm."  Most recent lab results (01/25/2020) of CBC is as follows: all values are WNL except for RBC at 2.44, Hgb at 7.5, HCT at 24.0, CO2 at 21, Glucose at 129, Creatinine at 1.38, Total Protein at 10.1, Albumin at 2.8, Total Bilirubin at <0.2, GFR Est Afr Am at 43. 01/25/2020 K/L light chains is as follows: Kappa free light chain at 22.7, Lamda free light chains at 17.7, K/L light chain ratio at 1.28 01/25/2020 MMP is as follows: all values are WNL except for IgG at 5220, Total Protein at 10.1, Alpha2 Glob at 1.2, Gamma Glob at 3.9, M Protein at 3.7, Total Globulin at 6.7, Albumin/Glob at 0.6. 01/25/2020 24-hr UPEP shows all values are WNL  On review of systems, pt reports lower back/left hip pain, unexpected weight loss, abdominal pain, loose stools and denies low appetite, constipation, rashes and any other symptoms.   On PMHx the pt reports Breast Cancer, Breast Lumpectomy, TIA, Restless leg, Myocardial infarction, HTN, HLD, Chronic Bronchitis.  INTERVAL HISTORY:  Tracy [name] is a wonderful 74 y.o. female who is here for evaluation and management of Multiple Myeloma. She is here for C4D1 Daratumumab. The patient's last visit with [place] was on 04/07/2020. The pt reports that she is doing well overall.  The pt reports no acute new sypmtoms. Denies any GI bleeding. NO prohibitive toxicities from daratumumab and zometa at this time. m spike down to 1.8g/dl hgb 8.7   MEDICAL HISTORY:  Past Medical History:  Diagnosis Date  . Anxiety   . Arthritis   . Breast cancer ([place]) 06/13/15  . Cancer ([place]) 2000   breast cancer  . Chronic bronchitis ([place])   . Chronic bronchitis ([place])   . Hyperlipidemia   . Hypertension   . Myocardial infarction ([place]) 2001  . Personal history of radiation therapy   . Restless leg   . Stroke [facility]) 2004   TIA, no  deficits    SURGICAL HISTORY: Past Surgical History:  Procedure Laterality Date  . ABDOMINAL HYSTERECTOMY  1985  . BREAST LUMPECTOMY Left 2000   radiation and chemo  . BREAST LUMPECTOMY Right 2016   radiation  . BREAST SURGERY  2001   lt breast lumpectomy  . IR IMAGING GUIDED PORT INSERTION  04/25/2020  . RADIOACTIVE SEED GUIDED PARTIAL MASTECTOMY WITH AXILLARY SENTINEL LYMPH NODE BIOPSY Right 07/07/2015   Procedure: RIGHT RADIOACTIVE SEED GUIDED PARTIAL MASTECTOMY WITH AXILLARY SENTINEL LYMPH NODE BIOPSY;  Surgeon: Autumn Messing III, MD;  Location: [place];  Service: General;  Laterality: Right;  . SMALL INTESTINE SURGERY    . TUBAL LIGATION      SOCIAL HISTORY: Social History   Socioeconomic History  . Marital status: Divorced    Spouse name: Not on file  . Number of children: 7  . Years of education: Not on file  . Highest education level: Not on file  Occupational History  . Occupation: retired  Tobacco Use  . Smoking status: Former Smoker    Packs/day: 1.00    Years: 20.00    Pack years: 20.00    Types: Cigarettes    Quit date: 07/30/2011    Years since quitting: 8.7  . Smokeless tobacco: Never Used  . Tobacco comment: Quit >4 years ago; 1 ppd for about  5/20 years (remaining was less)  Vaping Use  . Vaping Use: Former  Substance and Sexual Activity  . Alcohol use: No    Alcohol/week: 0.0 standard drinks  . Drug use: No  . Sexual activity: Not Currently  Other Topics Concern  . Not on file  Social History Narrative   Lives alone.  Retired.  Education:  11th grade GED.  Children:  7 (one here).    Social Determinants of Health   Financial Resource Strain:   . Difficulty of Paying Living Expenses: Not on file  Food Insecurity: No Food Insecurity  . Worried About Charity fundraiser in the Last Year: Never true  . Ran Out of Food in the Last Year: Never true  Transportation Needs: No Transportation Needs  . Lack of Transportation (Medical): No    . Lack of Transportation (Non-Medical): No  Physical Activity:   . Days of Exercise per Week: Not on file  . Minutes of Exercise per Session: Not on file  Stress:   . Feeling of Stress : Not on file  Social Connections:   . Frequency of Communication with Friends and Family: Not on file  . Frequency of Social Gatherings with Friends and Family: Not on file  . Attends Religious Services: Not on file  . Active Member of Clubs or Organizations: Not on file  . Attends Archivist Meetings: Not on file  . Marital Status: Not on file  Intimate Partner Violence:   . Fear of Current or Ex-Partner: Not on file  . Emotionally Abused: Not on file  . Physically Abused: Not on file  . Sexually Abused: Not on file    FAMILY HISTORY: Family History  Problem Relation Age of Onset  . Diabetes Father   . Lung cancer Sister        dx. <50; former smoker  . Diabetes Brother   . Diabetes Paternal Aunt   . Stroke Maternal Grandmother   . Diabetes Paternal Grandmother   . Emphysema Mother 46       smoker  . Diabetes Brother   . Brain cancer Brother 13       unknown tumor type  . Cancer Daughter 53       neck cancer  . Other Daughter        hysterectomy for unspecified reason  . Breast cancer Cousin   . Cancer Cousin        unspecified type  . Breast cancer Other        triple negative breast cancer in her 61s  . Cancer Daughter   . Colon polyps Neg Hx   . Esophageal cancer Neg Hx   . Gallbladder disease Neg Hx     ALLERGIES:  is allergic to bacitracin-neomycin-polymyxin  [neomycin-bacitracin zn-polymyx], nsaids, tape, ambien [zolpidem tartrate], amoxicillin, contrast media [iodinated diagnostic agents], latex, and prednisone.  MEDICATIONS:  Current Outpatient Medications  Medication Sig Dispense Refill  . Accu-Chek Softclix Lancets lancets Use as instructed to check sugars 1-2 times daily. 100 each 12  . acyclovir (ZOVIRAX) 400 MG tablet Take 1 tablet (400 mg total) by  mouth 2 (two) times daily. (Patient not taking: Reported on 04/13/2020) 60 tablet 11  . albuterol (VENTOLIN HFA) 108 (90 Base) MCG/ACT inhaler Inhale 2 puffs into the lungs every 6 (six) hours as needed for wheezing or shortness of breath. 8 g 2  . aspirin 81 MG tablet Take 81 mg by mouth daily.    Marland Kitchen atorvastatin (LIPITOR)  10 MG tablet TAKE 1 TABLET BY MOUTH  DAILY 90 tablet 3  . dexamethasone (DECADRON) 4 MG tablet Take 3 tabs with breakfast the day after each treatment. (Patient not taking: Reported on 04/13/2020) 20 tablet 4  . ergocalciferol (VITAMIN D2) 1.25 MG (50000 UT) capsule Take 1 capsule (50,000 Units total) by mouth once a week. 12 capsule 2  . ferrous sulfate 325 (65 FE) MG tablet Take 1 tablet (325 mg total) by mouth daily with breakfast. 30 tablet 6  . fluticasone (FLOVENT HFA) 110 MCG/ACT inhaler Inhale 2 puffs into the lungs in the morning and at bedtime. 1 each 12  . gabapentin (NEURONTIN) 300 MG capsule TAKE 1 CAPSULE BY MOUTH 3  TIMES DAILY 270 capsule 3  . glucose blood (ACCU-CHEK GUIDE) test strip Use as instructed to check sugars 1-2 times daily. 100 each 12  . HYDROcodone-acetaminophen (NORCO) 5-325 MG tablet Take 1 tablet by mouth every 6 (six) hours as needed for moderate pain or severe pain. 60 tablet 0  . loratadine (CLARITIN) 10 MG tablet Take 10 mg by mouth daily.    Marland Kitchen LORazepam (ATIVAN) 0.5 MG tablet Take 1 tablet (0.5 mg total) by mouth every 6 (six) hours as needed (Nausea or vomiting). (Patient not taking: Reported on 04/13/2020) 30 tablet 0  . losartan (COZAAR) 100 MG tablet TAKE 1 TABLET BY MOUTH  DAILY 90 tablet 3  . MELATONIN PO Take by mouth.     . metFORMIN (GLUCOPHAGE) 500 MG tablet TAKE 1 TABLET(500 MG) BY MOUTH TWICE DAILY WITH A MEAL 180 tablet 0  . mometasone (NASONEX) 50 MCG/ACT nasal spray Place 2 sprays into the nose daily. 17 g 12  . Na Sulfate-K Sulfate-Mg Sulf (SUPREP BOWEL PREP KIT) 17.5-3.13-1.6 GM/177ML SOLN Take 1 kit by mouth as directed.  (Patient not taking: Reported on 04/13/2020) 324 mL 0  . ondansetron (ZOFRAN) 8 MG tablet Take 1 tablet (8 mg total) by mouth 2 (two) times daily as needed (Nausea or vomiting). (Patient not taking: Reported on 04/13/2020) 30 tablet 1  . pantoprazole (PROTONIX) 40 MG tablet TAKE 1 TABLET(40 MG) BY MOUTH TWICE DAILY 90 tablet 1  . prochlorperazine (COMPAZINE) 10 MG tablet Take 1 tablet (10 mg total) by mouth every 6 (six) hours as needed (Nausea or vomiting). (Patient not taking: Reported on 04/13/2020) 30 tablet 1  . sertraline (ZOLOFT) 50 MG tablet TAKE 1 TABLET(50 MG) BY MOUTH DAILY 90 tablet 0  . tiZANidine (ZANAFLEX) 4 MG tablet TAKE 1 TABLET(4 MG) BY MOUTH AT BEDTIME (Patient not taking: Reported on 04/13/2020) 30 tablet 3  . traZODone (DESYREL) 100 MG tablet TAKE 1 TABLET BY MOUTH AT  BEDTIME (Patient not taking: Reported on 04/13/2020) 90 tablet 3   No current facility-administered medications for this visit.    REVIEW OF SYSTEMS:   A 10+ POINT REVIEW OF SYSTEMS WAS OBTAINED including neurology, dermatology, psychiatry, cardiac, respiratory, lymph, extremities, GI, GU, Musculoskeletal, constitutional, breasts, reproductive, HEENT.  All pertinent positives are noted in the HPI.  All others are negative.   PHYSICAL EXAMINATION: ECOG PERFORMANCE STATUS: 2 - Symptomatic, <50% confined to bed  . VS reviewed Exam was given in a chair   NAD GENERAL:alert, in no acute distress and comfortable SKIN: no acute rashes, no significant lesions EYES: conjunctiva are pink and non-injected, sclera anicteric OROPHARYNX: MMM, no exudates, no oropharyngeal erythema or ulceration NECK: supple, no JVD LYMPH:  no palpable lymphadenopathy in the cervical, axillary or inguinal regions LUNGS: clear to auscultation  b/l with normal respiratory effort HEART: regular rate & rhythm ABDOMEN:  normoactive bowel sounds , non tender, not distended. No palpable hepatosplenomegaly.  Extremity: no pedal edema PSYCH:  alert & oriented x 3 with fluent speech NEURO: no focal motor/sensory deficits  LABORATORY DATA:  I have reviewed the data as listed  . CBC Latest Ref Rng & Units 04/25/2020 04/21/2020 04/07/2020  WBC 4.0 - 10.5 K/uL 7.1 6.0 5.8  Hemoglobin 12.0 - 15.0 g/dL 8.7(L) 7.7(L) 9.7(L)  Hematocrit 36 - 46 % 26.3(L) 23.2(L) 29.6(L)  Platelets 150 - 400 K/uL 341 254 212    . CMP Latest Ref Rng & Units 04/21/2020 04/07/2020 03/29/2020  Glucose 70 - 99 mg/dL 105(H) 112(H) 139(H)  BUN 8 - 23 mg/dL _0 Creatinine 0.44 - 1.00 mg/dL 1.42(H) 1.13(H) 1.04(H)  Sodium 135 - 145 mmol/L 139 137 139  Potassium 3.5 - 5.1 mmol/L 4.0 4.1 3.8  Chloride 98 - 111 mmol/L 109 108 105  CO2 22 - 32 mmol/L 24 21(L) 25  Calcium 8.9 - 10.3 mg/dL 8.1(L) 8.9 9.3  Total Protein 6.5 - 8.1 g/dL 8.3(H) 8.4(H) 8.2(H)  Total Bilirubin 0.3 - 1.2 mg/dL <0.2(L) <0.2(L) 0.3  Alkaline Phos 38 - 126 U/L 94 94 98  AST 15 - 41 U/L 15 10(L) 8(L)  ALT 0 - 44 U/L _1 01/25/2020 K/L light chains:    01/25/2020 MMP:            RADIOGRAPHIC STUDIES: I have personally reviewed the radiological images as listed and agreed with the findings in the report. IR IMAGING GUIDED PORT INSERTION  Result Date: 04/25/2020 INDICATION: 74 year old female with a past history of bilateral breast cancer and current history of multiple myeloma. She requires durable venous access. EXAM: IMPLANTED PORT A CATH PLACEMENT WITH ULTRASOUND AND FLUOROSCOPIC GUIDANCE MEDICATIONS: 900 mg Cleocin; The antibiotic was administered within an appropriate time interval prior to skin puncture. ANESTHESIA/SEDATION: Versed 3 mg IV; Fentanyl 100 mcg IV; Moderate Sedation Time:  17 minutes The patient was continuously monitored during the procedure by the interventional radiology nurse under my direct supervision. FLUOROSCOPY TIME:  0 minutes, 30 seconds (9 mGy) COMPLICATIONS: None immediate. PROCEDURE: The right neck and chest was prepped with chlorhexidine,  and draped in the usual sterile fashion using maximum barrier technique (cap and mask, sterile gown, sterile gloves, large sterile sheet, hand hygiene and cutaneous antiseptic). Local anesthesia was attained by infiltration with 1% lidocaine with epinephrine. Ultrasound demonstrated patency of the right internal jugular vein, and this was documented with an image. Under real-time ultrasound guidance, this vein was accessed with a 21 gauge micropuncture needle and image documentation was performed. A small dermatotomy was made at the access site with an 11 scalpel. A 0.018" wire was advanced into the SVC and the access needle exchanged for a 25F micropuncture vascular sheath. The 0.018" wire was then removed and a 0.035" wire advanced into the IVC. An appropriate location for the subcutaneous reservoir was selected below the clavicle and an incision was made through the skin and underlying soft tissues. The subcutaneous tissues were then dissected using a combination of blunt and sharp surgical technique and a pocket was formed. A single lumen power injectable portacatheter was then tunneled through the subcutaneous tissues from the pocket to the dermatotomy and the port reservoir placed within the subcutaneous pocket. The venous access site was then serially dilated and a peel away vascular sheath placed over the wire. The wire was  removed and the port catheter advanced into position under fluoroscopic guidance. The catheter tip is positioned in the superior cavoatrial junction. This was documented with a spot image. The portacatheter was then tested and found to flush and aspirate well. The port was flushed with saline followed by 100 units/mL heparinized saline. The pocket was then closed in two layers using first subdermal inverted interrupted absorbable sutures followed by a running subcuticular suture. The epidermis was then sealed with Dermabond. The dermatotomy at the venous access site was also closed with  Dermabond. IMPRESSION: Successful placement of a right IJ approach Power Port with ultrasound and fluoroscopic guidance. The catheter is ready for use. Electronically Signed   By: Jacqulynn Cadet M.D.   On: 04/25/2020 17:13    ASSESSMENT & PLAN:   74 yo with   1) Newly diagnosed IgG Kappa Multiple myeloma with bone lesions, anemia, renal insuff. M spike @ 3.7g/dl 1p deletion, polymorphic variant, 13q deletion 2) h/o Dm2 3) Diabetic Neuropathy 4) CKD - likely from DM2, but could have an element of myeloma kidney. 5) h/o TIA and AMI 6) Iron deficiency   PLAN: Labs reviewed with patient -The pt has no prohibitive toxicities from continuing C4D1 Daratumumab/dexamethaosone necessitating treatment change at this time. -Continue Zometa   -Please schedule C5D1, C5D8 and C5D15 with portflush and labs Adding Carfilzomib D1,D8 and D15 from C5 MD visit with C5D1   FOLLOW UP: Please schedule C5D1, C5D8 and C5D15 with portflush and labs Adding Carfilzomib D1,D8 and D15 from C5 MD visit with C5D1    The total time spent in the appt was 30 minutes and more than 50% was on counseling and direct patient cares., discussing and adding carfilzomib to rx plan.  All of the patient's questions were answered with apparent satisfaction. The patient knows to call the clinic with any problems, questions or concerns.   [name] MD [place] AAHIVMS [facility] Hematology/Oncology Physician [facility]  (Office):       [phone] (Work cell):  [phone] (Fax):           [phone]  05/05/2020 2:06 AM   I, Yevette Edwards, am acting as a scribe for Dr. [name].   .I have reviewed the above documentation for accuracy and completeness, and I agree with the above. [name] MD

## 2020-05-05 NOTE — Patient Instructions (Signed)

## 2020-05-09 ENCOUNTER — Encounter: Payer: Self-pay | Admitting: Gastroenterology

## 2020-05-12 ENCOUNTER — Other Ambulatory Visit: Payer: Self-pay

## 2020-05-12 ENCOUNTER — Ambulatory Visit: Payer: Medicare Other

## 2020-05-12 DIAGNOSIS — E785 Hyperlipidemia, unspecified: Secondary | ICD-10-CM

## 2020-05-12 DIAGNOSIS — E1140 Type 2 diabetes mellitus with diabetic neuropathy, unspecified: Secondary | ICD-10-CM

## 2020-05-12 DIAGNOSIS — I10 Essential (primary) hypertension: Secondary | ICD-10-CM

## 2020-05-12 NOTE — Patient Instructions (Addendum)
Please review care plan below and call me at [phone] with any questions!  Thank you, Edyth Gunnels., Clinical Pharmacist  Goals Addressed            This Visit's Progress   . BP <140/90   On track    [place] (see longitudinal plan of care for additional care plan information)  Current Barriers:  . Current antihypertensive regimen: losartan 100 mg daily . Last practice recorded BP readings:  BP Readings from Last 3 Encounters:  11/03/19 117/61  08/20/19 140/79  07/15/19 (!) 145/72 .  Patient home BP readings are ranging: 500-938H systolic . Most recent eGFR/CrCl:  Lab Results  Component Value Date   EGFR 71 (L) 07/13/2015   Pharmacist Clinical Goal(s):  Marland Kitchen Over the next 90 days, patient will work with PharmD and providers to optimize antihypertensive regimen  Interventions: . Inter-disciplinary care team collaboration (see longitudinal plan of care) . Comprehensive medication review performed; medication list updated in the electronic medical record.   Patient Self Care Activities:  . Patient will continue to check BP at home at least once weekly and using a cuff for the upper arm rather than wrist. Document, and provide at future appointments . Patient will focus on medication adherence by continuing with current medication management.   Initial goal documentation     . LDL Cholesterol <70   On track    [place] (see longitudinal plan of care for additional care plan information)  Current Barriers:  . Current antihyperlipidemic regimen: atorvastatin 10 mg daily     Component Value Date/Time   CHOL 147 08/20/2019 1044   TRIG 141.0 08/20/2019 1044   HDL 53.70 08/20/2019 1044   CHOLHDL 3 08/20/2019 1044   VLDL 28.2 08/20/2019 1044   LDLCALC 65 08/20/2019 1044   LDLDIRECT 154.7 04/05/2013 0906 .   Marland Kitchen ASCVD risk enhancing conditions: age >96, DM, HTN, CKD, CHF, current smoker . 10-year ASCVD risk score: 22.7%  Pharmacist Clinical Goal(s):  Marland Kitchen Over  the next 90 days, patient will work with PharmD and providers towards optimized antihyperlipidemic therapy  Interventions: . Comprehensive medication review performed; medication list updated in electronic medical record.  Bertram Savin care team collaboration (see longitudinal plan of care)  Patient Self Care Activities:  . Patient will focus on medication adherence by continuing current medication management  Initial goal documentation     . PharmD Care Plan   On track    CARE PLAN ENTRY Current Barriers:  . Chronic Disease Management support, education, and care coordination needs related to Diabetes and Anxiety/Depression   Diabetes . Pharmacist Clinical Goal(s): o Over the next 90 days, patient will work with PharmD and providers to achieve A1c goal <8% . Current regimen:  o Metformin 500 mg twice daily  . Patient self care activities - Over the next 90 days, patient will: o Continue metformin 500 mg twice daily Anxiety/Depression . Pharmacist Clinical Goal(s) o Over the next 90 days, patient will work with PharmD and providers to minimize anxiety related symptoms. . Current regimen:  o Tapering off of sertraline: currently half of the 50 mg (25 mg) until tablets are gone . Interventions: o Continue current taper of sertraline . Patient self care activities - Over the next 90 days, patient will: o Continue current taper of sertraline at half a tablet (25 mg) until gone  Follow-up visit goal documentation.       The patient verbalized understanding of instructions provided today and agreed  to receive a mailed copy of patient instruction and/or educational materials. Telephone follow up appointment with pharmacy team member scheduled for: See next appointment with "Care Management Staff" under "What's Next" below.   [name], Pharm.D., BCGP Clinical Pharmacist [facility] [phone]  Blood Glucose Monitoring, Adult Monitoring your blood sugar  (glucose) is an important part of managing your diabetes (diabetes mellitus). Blood glucose monitoring involves checking your blood glucose as often as directed and keeping a record (log) of your results over time. Checking your blood glucose regularly and keeping a blood glucose log can:  Help you and your health care provider adjust your diabetes management plan as needed, including your medicines or insulin.  Help you understand how food, exercise, illnesses, and medicines affect your blood glucose.  Let you know what your blood glucose is at any time. You can quickly find out if you have low blood glucose (hypoglycemia) or high blood glucose (hyperglycemia). Your health care provider will set individualized treatment goals for you. Your goals will be based on your age, other medical conditions you have, and how you respond to diabetes treatment. Generally, the goal of treatment is to maintain the following blood glucose levels:  Before meals (preprandial): 80-130 mg/dL (4.4-7.2 mmol/L).  After meals (postprandial): below 180 mg/dL (10 mmol/L).  A1c level: less than 7%. Supplies needed:  Blood glucose meter.  Test strips for your meter. Each meter has its own strips. You must use the strips that came with your meter.  A needle to prick your finger (lancet). Do not use a lancet more than one time.  A device that holds the lancet (lancing device).  A journal or log book to write down your results. How to check your blood glucose  1. Wash your hands with soap and water. 2. Prick the side of your finger (not the tip) with the lancet. Use a different finger each time. 3. Gently rub the finger until a small drop of blood appears. 4. Follow instructions that come with your meter for inserting the test strip, applying blood to the strip, and using your blood glucose meter. 5. Write down your result and any notes. Some meters allow you to use areas of your body other than your finger  (alternative sites) to test your blood. The most common alternative sites are:  Forearm.  Thigh.  Palm of the hand. If you think you may have hypoglycemia, or if you have a history of not knowing when your blood glucose is getting low (hypoglycemia unawareness), do not use alternative sites. Use your finger instead. Alternative sites may not be as accurate as the fingers, because blood flow is slower in these areas. This means that the result you get may be delayed, and it may be different from the result that you would get from your finger. Follow these instructions at home: Blood glucose log   Every time you check your blood glucose, write down your result. Also write down any notes about things that may be affecting your blood glucose, such as your diet and exercise for the day. This information can help you and your health care provider: ? Look for patterns in your blood glucose over time. ? Adjust your diabetes management plan as needed.  Check if your meter allows you to download your records to a computer. Most glucose meters store a record of glucose readings in the meter. If you have type 1 diabetes:  Check your blood glucose 2 or more times a  day.  Also check your blood glucose: ? Before every insulin injection. ? Before and after exercise. ? Before meals. ? 2 hours after a meal. ? Occasionally between 2:00 a.m. and 3:00 a.m., as directed. ? Before potentially dangerous tasks, like driving or using heavy machinery. ? At bedtime.  You may need to check your blood glucose more often, up to 6-10 times a day, if you: ? Use an insulin pump. ? Need multiple daily injections (MDI). ? Have diabetes that is not well-controlled. ? Are ill. ? Have a history of severe hypoglycemia. ? Have hypoglycemia unawareness. If you have type 2 diabetes:  If you take insulin or other diabetes medicines, check your blood glucose 2 or more times a day.  If you are on intensive insulin  therapy, check your blood glucose 4 or more times a day. Occasionally, you may also need to check between 2:00 a.m. and 3:00 a.m., as directed.  Also check your blood glucose: ? Before and after exercise. ? Before potentially dangerous tasks, like driving or using heavy machinery.  You may need to check your blood glucose more often if: ? Your medicine is being adjusted. ? Your diabetes is not well-controlled. ? You are ill. General tips  Always keep your supplies with you.  If you have questions or need help, all blood glucose meters have a 24-hour "hotline" phone number that you can call. You may also contact your health care provider.  After you use a few boxes of test strips, adjust (calibrate) your blood glucose meter by following instructions that came with your meter. Contact a health care provider if:  Your blood glucose is at or above 240 mg/dL (13.3 mmol/L) for 2 days in a row.  You have been sick or have had a fever for 2 days or longer, and you are not getting better.  You have any of the following problems for more than 6 hours: ? You cannot eat or drink. ? You have nausea or vomiting. ? You have diarrhea. Get help right away if:  Your blood glucose is lower than 54 mg/dL (3 mmol/L).  You become confused or you have trouble thinking clearly.  You have difficulty breathing.  You have moderate or large ketone levels in your urine. Summary  Monitoring your blood sugar (glucose) is an important part of managing your diabetes (diabetes mellitus).  Blood glucose monitoring involves checking your blood glucose as often as directed and keeping a record (log) of your results over time.  Your health care provider will set individualized treatment goals for you. Your goals will be based on your age, other medical conditions you have, and how you respond to diabetes treatment.  Every time you check your blood glucose, write down your result. Also write down any notes  about things that may be affecting your blood glucose, such as your diet and exercise for the day. This information is not intended to replace advice given to you by your health care provider. Make sure you discuss any questions you have with your health care provider. Document Revised: 05/08/2018 Document Reviewed: 12/25/2015 Elsevier Patient Education  2020 [employer].

## 2020-05-12 NOTE — Progress Notes (Signed)
Chronic Care Management Pharmacy  Name: Tracy [name]  MRN: [id number] DOB: 10-24-1945  Chief Complaint/ HPI  Tracy [name],  74 y.o. , female presents for their Initial CCM visit with the clinical pharmacist via telephone due to COVID-19 Pandemic.  PCP : [name], MD  Their chronic conditions include: Anxiety, HLD, HTN, MI, TIA, RLS.  Patient Active Problem List   Diagnosis Date Noted  . Counseling regarding advance care planning and goals of care 02/07/2020  . Multiple myeloma ([place]) 01/25/2020  . Dizziness 10/12/2019  . Post-nasal drainage 10/12/2019  . Allergic rhinitis 08/19/2018  . Type 2 diabetes mellitus with diabetic neuropathy, unspecified ([place]) 11/05/2017  . Encounter for long-term use of muscle relaxants 09/24/2017  . CAD in native artery 09/24/2017  . OSA (obstructive sleep apnea) 09/24/2017  . [place] 09/24/2017  . Morbid obesity ([place]) 06/02/2017  . Depression 06/02/2017  . Iron deficiency anemia 09/23/2016  . Anemia of chronic disease 09/28/2015  . Genetic testing 08/21/2015  . History of left breast cancer 07/13/2015  . History of right breast cancer 06/20/2015  . Hearing loss due to cerumen impaction 12/29/2014  . Allergy to adhesive tape 05/19/2014  . Hyperlipidemia 12/06/2013  . GERD (gastroesophageal reflux disease) 12/06/2013  . Cervical disc disease 11/11/2013  . Osteopenia 05/25/2013  . Allergic asthma 12/24/2012  . Insomnia 04/02/2012  . Physical exam, annual 04/02/2012  . HTN (hypertension) 02/03/2012  . Vertigo, benign positional 02/03/2012  . Left groin pain 02/03/2012  . Hip pain 10/10/2011  . TIA (transient ischemic attack) 07/24/2011  . Allergic reaction 07/05/2011  . Osteoarthrosis involving lower leg 10/19/2010  . Disorder of bone and cartilage 05/01/2010  . Generalized anxiety disorder 05/01/2010  . Vitamin D deficiency 02/20/2010  . Unspecified chronic bronchitis ([place]) 08/24/2009  . Other lymphedema 10/29/2007    Social History   Socioeconomic History  . Marital status: Divorced    Spouse name: Not on file  . Number of children: 7  . Years of education: Not on file  . Highest education level: Not on file  Occupational History  . Occupation: retired  Tobacco Use  . Smoking status: Former Smoker    Packs/day: 1.00    Years: 20.00    Pack years: 20.00    Types: Cigarettes    Quit date: 07/30/2011    Years since quitting: 8.7  . Smokeless tobacco: Never Used  . Tobacco comment: Quit >4 years ago; 1 ppd for about 5/20 years (remaining was less)  Vaping Use  . Vaping Use: Former  Substance and Sexual Activity  . Alcohol use: No    Alcohol/week: 0.0 standard drinks  . Drug use: No  . Sexual activity: Not Currently  Other Topics Concern  . Not on file  Social History Narrative   Lives alone.  Retired.  Education:  11th grade GED.  Children:  7 (one here).    Social Determinants of Health   Financial Resource Strain:   . Difficulty of Paying Living Expenses: Not on file  Food Insecurity: No Food Insecurity  . Worried About Charity fundraiser in the Last Year: Never true  . Ran Out of Food in the Last Year: Never true  Transportation Needs: No Transportation Needs  . Lack of Transportation (Medical): No  . Lack of Transportation (Non-Medical): No  Physical Activity:   . Days of Exercise per Week: Not on file  . Minutes of Exercise per Session: Not on file  Stress:   .  Feeling of Stress : Not on file  Social Connections:   . Frequency of Communication with Friends and Family: Not on file  . Frequency of Social Gatherings with Friends and Family: Not on file  . Attends Religious Services: Not on file  . Active Member of Clubs or Organizations: Not on file  . Attends Archivist Meetings: Not on file  . Marital Status: Not on file   Family History  Problem Relation Age of Onset  . Diabetes Father   . Lung cancer Sister        dx. <50; former smoker  . Diabetes  Brother   . Diabetes Paternal Aunt   . Stroke Maternal Grandmother   . Diabetes Paternal Grandmother   . Emphysema Mother 23       smoker  . Diabetes Brother   . Brain cancer Brother 60       unknown tumor type  . Cancer Daughter 39       neck cancer  . Other Daughter        hysterectomy for unspecified reason  . Breast cancer Cousin   . Cancer Cousin        unspecified type  . Breast cancer Other        triple negative breast cancer in her 66s  . Cancer Daughter   . Colon polyps Neg Hx   . Esophageal cancer Neg Hx   . Gallbladder disease Neg Hx    Allergies  Allergen Reactions  . Bacitracin-Neomycin-Polymyxin  [Neomycin-Bacitracin Zn-Polymyx] Swelling  . Nsaids Other (See Comments)    nosebleeds  . Tape Hives    Pt cannot tolerate bandaids, tape, or any other adhesives.   . Ambien [Zolpidem Tartrate]     Hives   . Amoxicillin     Rash   . Contrast Media [Iodinated Diagnostic Agents] Hives    Pt states hives with prior ct, was given benadryl to resolve  . Latex Swelling  . Prednisone     Pt tolerates Dexamethasone. Pt cannot recall what happens when she takes Prednisone   Outpatient Encounter Medications as of 05/12/2020  Medication Sig  . Accu-Chek Softclix Lancets lancets Use as instructed to check sugars 1-2 times daily.  Marland Kitchen acyclovir (ZOVIRAX) 400 MG tablet Take 1 tablet (400 mg total) by mouth 2 (two) times daily.  Marland Kitchen albuterol (VENTOLIN HFA) 108 (90 Base) MCG/ACT inhaler Inhale 2 puffs into the lungs every 6 (six) hours as needed for wheezing or shortness of breath.  Marland Kitchen aspirin 81 MG tablet Take 81 mg by mouth daily.  Marland Kitchen atorvastatin (LIPITOR) 10 MG tablet TAKE 1 TABLET BY MOUTH  DAILY  . dexamethasone (DECADRON) 4 MG tablet Take 3 tabs with breakfast the day after each treatment.  . ergocalciferol (VITAMIN D2) 1.25 MG (50000 UT) capsule Take 1 capsule (50,000 Units total) by mouth once a week.  . ferrous sulfate 325 (65 FE) MG tablet Take 1 tablet (325 mg total)  by mouth daily with breakfast.  . fluticasone (FLOVENT HFA) 110 MCG/ACT inhaler Inhale 2 puffs into the lungs in the morning and at bedtime.  . gabapentin (NEURONTIN) 300 MG capsule TAKE 1 CAPSULE BY MOUTH 3  TIMES DAILY  . glucose blood (ACCU-CHEK GUIDE) test strip Use as instructed to check sugars 1-2 times daily.  Marland Kitchen HYDROcodone-acetaminophen (NORCO) 5-325 MG tablet Take 1 tablet by mouth every 6 (six) hours as needed for moderate pain or severe pain.  Marland Kitchen loratadine (CLARITIN) 10 MG tablet Take 10  mg by mouth daily.  Marland Kitchen LORazepam (ATIVAN) 0.5 MG tablet Take 1 tablet (0.5 mg total) by mouth every 6 (six) hours as needed (Nausea or vomiting).  Marland Kitchen losartan (COZAAR) 100 MG tablet TAKE 1 TABLET BY MOUTH  DAILY  . MELATONIN PO Take by mouth.   . metFORMIN (GLUCOPHAGE) 500 MG tablet TAKE 1 TABLET(500 MG) BY MOUTH TWICE DAILY WITH A MEAL  . mometasone (NASONEX) 50 MCG/ACT nasal spray Place 2 sprays into the nose daily.  . Na Sulfate-K Sulfate-Mg Sulf (SUPREP BOWEL PREP KIT) 17.5-3.13-1.6 GM/177ML SOLN Take 1 kit by mouth as directed.  . ondansetron (ZOFRAN) 8 MG tablet Take 1 tablet (8 mg total) by mouth 2 (two) times daily as needed (Nausea or vomiting).  . pantoprazole (PROTONIX) 40 MG tablet TAKE 1 TABLET(40 MG) BY MOUTH TWICE DAILY  . prochlorperazine (COMPAZINE) 10 MG tablet Take 1 tablet (10 mg total) by mouth every 6 (six) hours as needed (Nausea or vomiting).  . sertraline (ZOLOFT) 50 MG tablet TAKE 1 TABLET(50 MG) BY MOUTH DAILY  . tiZANidine (ZANAFLEX) 4 MG tablet TAKE 1 TABLET(4 MG) BY MOUTH AT BEDTIME  . traZODone (DESYREL) 100 MG tablet TAKE 1 TABLET BY MOUTH AT  BEDTIME   No facility-administered encounter medications on file as of 05/12/2020.    Hypertension   BP goal <140/90  BP Readings from Last 3 Encounters:  05/05/20 (!) 164/77  05/05/20 (!) 158/74  04/25/20 (!) 174/70   Patient checks BP at home daily. We discussed diet and exercise extensively, reviewed home BP monitoring  recommendations. Had not been testing but states will test today and over the weekend and report any elevated readings. Plans to inform MD of readings next week.  Patient is currently not at goal on the following medications:  . Losartan 100 mg daily   Plan  Continue current medications.    Diabetes   a1c goal <7-7.5%  Lab Results  Component Value Date/Time   HGBA1C 7.0 (H) 01/13/2020 01:52 PM   HGBA1C 8.4 (H) 08/20/2019 10:44 AM   EGFR 71 (L) 07/13/2015 03:16 PM    Improvement in a1c from 8.4% (07/2019) to . Metformin previously stopped due to concern with liver damage. Patient has started back on therapy. Patient is currently on the following medications:  Marland Kitchen Metformin 500 mg twice daily with meal  Patient having some difficulty with testing BG at home. We discussed proper use of accucheck meter and strips. Patient walked through steps to properly use lancing device, strips, meter. Confirmed patient has my direct line to contact me with any questions or issues.   Plan  Continue current medications.   Patient to inform [place] next week if she has any further issues testing BG.  Hyperlipidemia   LDL goal < 70  Lipid Panel     Component Value Date/Time   CHOL 127 01/13/2020 1352   TRIG 129.0 01/13/2020 1352   HDL 44.00 01/13/2020 1352   LDLCALC 58 01/13/2020 1352   LDLDIRECT 154.7 04/05/2013 0906    Hepatic Function Latest Ref Rng & Units 05/05/2020 04/21/2020 04/07/2020  Total Protein 6.5 - 8.1 g/dL 8.1 8.3(H) 8.4(H)  Albumin 3.5 - 5.0 g/dL 3.0(L) 3.0(L) 3.0(L)  AST 15 - 41 U/L 11(L) 15 10(L)  ALT 0 - 44 U/L _0 Alk Phosphatase 38 - 126 U/L 91 94 94  Total Bilirubin 0.3 - 1.2 mg/dL 0.2(L) <0.2(L) <0.2(L)  Bilirubin, Direct 0.0 - 0.3 mg/dL - - -    LDL  58 12/2019. Patient is currently at goal on the following medications:  . Atorvastatin 10 mg daily   Secondary prevention with asa. Denies any abnormal bruising, bleeding from nose or gums or blood in urine or stool.  Patient is currently controlled on the following medications:  . Aspirin 81 mg once daily   Plan  Continue current medications  Medication Management   Receives prescription medications from:  [facility], [address] [place] [address] Phone: [phone] Fax: [place], [address] [place], [place] [address] Phone: [phone] Fax: [phone]  [facility] [phone] @ [place], [place] [address] Phone: [phone] Fax: [phone]   Did not report any issues with current medication management.  Plan  Continue current medication management strategy.  Follow up: 1 month phone visit. BP (+home monitoring), DM (+BG testing accucheck guide), HLD.  Future Appointments  Date Time Provider [place]  05/16/2020  4:00 PM Milus Banister, MD [facility]  05/19/2020 10:00 AM [facility] LAB [facility] None  05/19/2020 10:15 AM [facility] [place] FLUSH [facility] None  05/19/2020 11:00 AM [facility] INFUSION [facility] None  06/02/2020 10:30 AM [facility] LAB [facility] None  06/02/2020 10:45 AM [facility] [place] FLUSH [facility] None  06/02/2020 11:30 AM [facility] INFUSION [facility] None  06/15/2020  2:00 PM [facility] PHARMACIST [facility]   [name], Pharm.D. Clinical Pharmacist [facility] [phone]

## 2020-05-16 ENCOUNTER — Ambulatory Visit (AMBULATORY_SURGERY_CENTER): Payer: Medicare Other | Admitting: Gastroenterology

## 2020-05-16 ENCOUNTER — Other Ambulatory Visit: Payer: Self-pay

## 2020-05-16 ENCOUNTER — Encounter: Payer: Self-pay | Admitting: Gastroenterology

## 2020-05-16 VITALS — BP 162/56 | HR 75 | Temp 97.1°F | Resp 23 | Ht 60.0 in | Wt 179.0 lb

## 2020-05-16 DIAGNOSIS — I10 Essential (primary) hypertension: Secondary | ICD-10-CM | POA: Diagnosis not present

## 2020-05-16 DIAGNOSIS — K5730 Diverticulosis of large intestine without perforation or abscess without bleeding: Secondary | ICD-10-CM | POA: Diagnosis not present

## 2020-05-16 DIAGNOSIS — D509 Iron deficiency anemia, unspecified: Secondary | ICD-10-CM | POA: Diagnosis not present

## 2020-05-16 DIAGNOSIS — D649 Anemia, unspecified: Secondary | ICD-10-CM

## 2020-05-16 DIAGNOSIS — R1310 Dysphagia, unspecified: Secondary | ICD-10-CM | POA: Diagnosis not present

## 2020-05-16 DIAGNOSIS — K319 Disease of stomach and duodenum, unspecified: Secondary | ICD-10-CM | POA: Diagnosis not present

## 2020-05-16 DIAGNOSIS — K648 Other hemorrhoids: Secondary | ICD-10-CM

## 2020-05-16 DIAGNOSIS — R195 Other fecal abnormalities: Secondary | ICD-10-CM | POA: Diagnosis not present

## 2020-05-16 DIAGNOSIS — K3189 Other diseases of stomach and duodenum: Secondary | ICD-10-CM

## 2020-05-16 MED ORDER — SODIUM CHLORIDE 0.9 % IV SOLN: 500.0000 mL | Freq: Once | INTRAVENOUS | Status: DC

## 2020-05-16 NOTE — Patient Instructions (Signed)
Handouts on diverticulosis and hemorrhoids given to you today  Await pathology result from Dr. Ardis Hughs  YOU HAD AN ENDOSCOPIC PROCEDURE TODAY AT THE [facility]:   Refer to the procedure report that was given to you for any specific questions about what was found during the examination.  If the procedure report does not answer your questions, please call your gastroenterologist to clarify.  If you requested that your care partner not be given the details of your procedure findings, then the procedure report has been included in a sealed envelope for you to review at your convenience later.  YOU SHOULD EXPECT: Some feelings of bloating in the abdomen. Passage of more gas than usual.  Walking can help get rid of the air that was put into your GI tract during the procedure and reduce the bloating. If you had a lower endoscopy (such as a colonoscopy or flexible sigmoidoscopy) you may notice spotting of blood in your stool or on the toilet paper. If you underwent a bowel prep for your procedure, you may not have a normal bowel movement for a few days.  Please Note:  You might notice some irritation and congestion in your nose or some drainage.  This is from the oxygen used during your procedure.  There is no need for concern and it should clear up in a day or so.  SYMPTOMS TO REPORT IMMEDIATELY:   Following lower endoscopy (colonoscopy or flexible sigmoidoscopy):  Excessive amounts of blood in the stool  Significant tenderness or worsening of abdominal pains  Swelling of the abdomen that is new, acute  Fever of 100F or higher   Following upper endoscopy (EGD)  Vomiting of blood or coffee ground material  New chest pain or pain under the shoulder blades  Painful or persistently difficult swallowing  New shortness of breath  Fever of 100F or higher  Black, tarry-looking stools  For urgent or emergent issues, a gastroenterologist can be reached at any hour by calling [phone]. Do not use MyChart messaging for urgent concerns.    DIET:  We do recommend a small meal at first, but then you may proceed to your regular diet.  Drink plenty of fluids but you should avoid alcoholic beverages for 24 hours.  ACTIVITY:  You should plan to take it easy for the rest of today and you should NOT DRIVE or use heavy machinery until tomorrow (because of the sedation medicines used during the test).    FOLLOW UP: Our staff will call the number listed on your records 48-72 hours following your procedure to check on you and address any questions or concerns that you may have regarding the information given to you following your procedure. If we do not reach you, we will leave a message.  We will attempt to reach you two times.  During this call, we will ask if you have developed any symptoms of COVID 19. If you develop any symptoms (ie: fever, flu-like symptoms, shortness of breath, cough etc.) before then, please call [phone].  If you test positive for Covid 19 in the 2 weeks post procedure, please call and report this information to [place].    If any biopsies were taken you will be contacted by phone or by letter within the next 1-3 weeks.  Please call us at [phone] if you have not heard about the biopsies in 3 weeks.    SIGNATURES/CONFIDENTIALITY: You and/or your care partner have signed paperwork which will be entered into  your electronic medical record.  These signatures attest to the fact that that the information above on your After Visit Summary has been reviewed and is understood.  Full responsibility of the confidentiality of this discharge information lies with you and/or your care-partner.

## 2020-05-16 NOTE — Op Note (Signed)
[place] Patient Name: Tracy [name] Procedure Date: 05/16/2020 3:46 PM MRN: [id number] Endoscopist: Milus Banister , MD Age: 74 Referring MD:  Date of Birth: Jan 30, 1946 Gender: Female Account #: [id number] Procedure:                Colonoscopy Indications:              Heme positive stool, daughter with colon cancer,                            anemia (multifactorial) Medicines:                Monitored Anesthesia Care Procedure:                Pre-Anesthesia Assessment:                           - Prior to the procedure, a History and Physical                            was performed, and patient medications and                            allergies were reviewed. The patient's tolerance of                            previous anesthesia was also reviewed. The risks                            and benefits of the procedure and the sedation                            options and risks were discussed with the patient.                            All questions were answered, and informed consent                            was obtained. Prior Anticoagulants: The patient has                            taken no previous anticoagulant or antiplatelet                            agents. ASA Grade Assessment: III - A patient with                            severe systemic disease. After reviewing the risks                            and benefits, the patient was deemed in                            satisfactory condition to undergo the procedure.  After obtaining informed consent, the colonoscope                            was passed under direct vision. Throughout the                            procedure, the patient's blood pressure, pulse, and                            oxygen saturations were monitored continuously. The                            Colonoscope was introduced through the anus and                            advanced to the the cecum,  identified by                            appendiceal orifice and ileocecal valve. The                            colonoscopy was performed without difficulty. The                            patient tolerated the procedure well. The quality                            of the bowel preparation was adequate. The                            ileocecal valve, appendiceal orifice, and rectum                            were photographed. Scope In: 3:51:03 PM Scope Out: 4:06:09 PM Scope Withdrawal Time: 0 hours 10 minutes 36 seconds  Total Procedure Duration: 0 hours 15 minutes 6 seconds  Findings:                 A few small-mouthed diverticula were found in the                            left colon.                           External and internal hemorrhoids were found. The                            hemorrhoids were small.                           The exam was otherwise without abnormality on                            direct and retroflexion views. Complications:            No immediate complications. Estimated blood loss:  None. Estimated Blood Loss:     Estimated blood loss: none. Impression:               - Diverticulosis in the left colon.                           - External and internal hemorrhoids.                           - The examination was otherwise normal on direct                            and retroflexion views.                           - No specimens collected. Recommendation:           - EGD now. Milus Banister, MD 05/16/2020 4:08:33 PM This report has been signed electronically.

## 2020-05-16 NOTE — Progress Notes (Signed)
Pt's states no medical or surgical changes since previsit or office visit. 

## 2020-05-16 NOTE — Progress Notes (Signed)
Report to PACU, RN, vss, BBS= Clear.  

## 2020-05-16 NOTE — Progress Notes (Signed)
Called to room to assist during endoscopic procedure.  Patient ID and intended procedure confirmed with present staff. Received instructions for my participation in the procedure from the performing physician.  

## 2020-05-16 NOTE — Op Note (Signed)
[place] Patient Name: Tracy [name] Procedure Date: 05/16/2020 3:45 PM MRN: [id number] Endoscopist: Milus Banister , MD Age: 74 Referring MD:  Date of Birth: [birth year] Gender: Female Account #: [id number] Procedure:                Upper GI endoscopy Indications:              FOBT + multifactorial anemia, dysphagia Medicines:                Monitored Anesthesia Care Procedure:                Pre-Anesthesia Assessment:                           - Prior to the procedure, a History and Physical                            was performed, and patient medications and                            allergies were reviewed. The patient's tolerance of                            previous anesthesia was also reviewed. The risks                            and benefits of the procedure and the sedation                            options and risks were discussed with the patient.                            All questions were answered, and informed consent                            was obtained. Prior Anticoagulants: The patient has                            taken no previous anticoagulant or antiplatelet                            agents. ASA Grade Assessment: III - A patient with                            severe systemic disease. After reviewing the risks                            and benefits, the patient was deemed in                            satisfactory condition to undergo the procedure.                           After obtaining informed consent, the endoscope was  passed under direct vision. Throughout the                            procedure, the patient's blood pressure, pulse, and                            oxygen saturations were monitored continuously. The                            Endoscope was introduced through the mouth, and                            advanced to the second part of duodenum. The upper                            GI endoscopy  was accomplished without difficulty.                            The patient tolerated the procedure well. Scope In: Scope Out: Findings:                 Mild inflammation characterized by congestion                            (edema) and granularity was found in the entire                            examined stomach. Biopsies were taken with a cold                            forceps for histology.                           The exam was otherwise without abnormality. Complications:            No immediate complications. Estimated blood loss:                            None. Estimated Blood Loss:     Estimated blood loss: none. Impression:               - Mild, non-specific gatritis. Biopsied to check                            for H. pylori.                           - The examination was otherwise normal. Recommendation:           - Patient has a contact number available for                            emergencies. The signs and symptoms of potential                            delayed complications were discussed with the  patient. Return to normal activities tomorrow.                            Written discharge instructions were provided to the                            patient.                           - Resume previous diet.                           - Continue present medications.                           - Await pathology results. Milus Banister, MD 05/16/2020 4:16:48 PM This report has been signed electronically.

## 2020-05-18 ENCOUNTER — Telehealth: Payer: Self-pay

## 2020-05-18 NOTE — Telephone Encounter (Signed)
°  Follow up Call-  Call back number 05/16/2020  Post procedure Call Back phone  # [phone]  Permission to leave phone message Yes  Some recent data might be hidden     Patient questions:  Do you have a fever, pain , or abdominal swelling? No. Pain Score  0 *  Have you tolerated food without any problems? Yes.    Have you been able to return to your normal activities? Yes.    Do you have any questions about your discharge instructions: Diet   No. Medications  No. Follow up visit  No.  Do you have questions or concerns about your Care? No.  Actions: * If pain score is 4 or above: 1. No action needed, pain <4.Have you developed a fever since your procedure? no  2.   Have you had an respiratory symptoms (SOB or cough) since your procedure? no  3.   Have you tested positive for COVID 19 since your procedure no  4.   Have you had any family members/close contacts diagnosed with the COVID 19 since your procedure?  no   If yes to any of these questions please route to Joylene John, RN and Joella Prince, RN

## 2020-05-19 ENCOUNTER — Other Ambulatory Visit: Payer: Self-pay | Admitting: *Deleted

## 2020-05-19 ENCOUNTER — Inpatient Hospital Stay: Payer: Medicare Other

## 2020-05-19 ENCOUNTER — Other Ambulatory Visit: Payer: Self-pay

## 2020-05-19 VITALS — BP 142/60 | HR 70 | Temp 99.0°F | Resp 17 | Wt 181.0 lb

## 2020-05-19 DIAGNOSIS — D509 Iron deficiency anemia, unspecified: Secondary | ICD-10-CM

## 2020-05-19 DIAGNOSIS — Z8673 Personal history of transient ischemic attack (TIA), and cerebral infarction without residual deficits: Secondary | ICD-10-CM | POA: Diagnosis not present

## 2020-05-19 DIAGNOSIS — N189 Chronic kidney disease, unspecified: Secondary | ICD-10-CM | POA: Diagnosis not present

## 2020-05-19 DIAGNOSIS — C9000 Multiple myeloma not having achieved remission: Secondary | ICD-10-CM

## 2020-05-19 DIAGNOSIS — Z5112 Encounter for antineoplastic immunotherapy: Secondary | ICD-10-CM | POA: Diagnosis not present

## 2020-05-19 DIAGNOSIS — Z87891 Personal history of nicotine dependence: Secondary | ICD-10-CM | POA: Diagnosis not present

## 2020-05-19 DIAGNOSIS — Z923 Personal history of irradiation: Secondary | ICD-10-CM | POA: Diagnosis not present

## 2020-05-19 DIAGNOSIS — I252 Old myocardial infarction: Secondary | ICD-10-CM | POA: Diagnosis not present

## 2020-05-19 DIAGNOSIS — Z853 Personal history of malignant neoplasm of breast: Secondary | ICD-10-CM | POA: Diagnosis not present

## 2020-05-19 DIAGNOSIS — E1122 Type 2 diabetes mellitus with diabetic chronic kidney disease: Secondary | ICD-10-CM | POA: Diagnosis not present

## 2020-05-19 DIAGNOSIS — E1140 Type 2 diabetes mellitus with diabetic neuropathy, unspecified: Secondary | ICD-10-CM | POA: Diagnosis not present

## 2020-05-19 DIAGNOSIS — Z7189 Other specified counseling: Secondary | ICD-10-CM

## 2020-05-19 LAB — CMP (CANCER CENTER ONLY)
ALT: 7 U/L (ref 0–44)
AST: 10 U/L — ABNORMAL LOW (ref 15–41)
Albumin: 3.1 g/dL — ABNORMAL LOW (ref 3.5–5.0)
Alkaline Phosphatase: 89 U/L (ref 38–126)
Anion gap: 7 (ref 5–15)
BUN: 15 mg/dL (ref 8–23)
CO2: 23 mmol/L (ref 22–32)
Calcium: 9.1 mg/dL (ref 8.9–10.3)
Chloride: 111 mmol/L (ref 98–111)
Creatinine: 1.33 mg/dL — ABNORMAL HIGH (ref 0.44–1.00)
GFR, Estimated: 41 mL/min — ABNORMAL LOW (ref >60)
Glucose, Bld: 108 mg/dL — ABNORMAL HIGH (ref 70–99)
Potassium: 4 mmol/L (ref 3.5–5.1)
Sodium: 141 mmol/L (ref 135–145)
Total Bilirubin: <0.2 mg/dL — ABNORMAL LOW (ref 0.3–1.2)
Total Protein: 7.9 g/dL (ref 6.5–8.1)

## 2020-05-19 LAB — CBC WITH DIFFERENTIAL/PLATELET
Abs Immature Granulocytes: 0.01 10*3/uL (ref 0.00–0.07)
Basophils Absolute: 0 10*3/uL (ref 0.0–0.1)
Basophils Relative: 0 %
Eosinophils Absolute: 0 10*3/uL (ref 0.0–0.5)
Eosinophils Relative: 1 %
HCT: 25.8 % — ABNORMAL LOW (ref 36.0–46.0)
Hemoglobin: 8.3 g/dL — ABNORMAL LOW (ref 12.0–15.0)
Immature Granulocytes: 0 %
Lymphocytes Relative: 42 %
Lymphs Abs: 2.4 10*3/uL (ref 0.7–4.0)
MCH: 31.6 pg (ref 26.0–34.0)
MCHC: 32.2 g/dL (ref 30.0–36.0)
MCV: 98.1 fL (ref 80.0–100.0)
Monocytes Absolute: 0.3 10*3/uL (ref 0.1–1.0)
Monocytes Relative: 6 %
Neutro Abs: 2.9 10*3/uL (ref 1.7–7.7)
Neutrophils Relative %: 51 %
Platelets: 292 10*3/uL (ref 150–400)
RBC: 2.63 MIL/uL — ABNORMAL LOW (ref 3.87–5.11)
RDW: 14.5 % (ref 11.5–15.5)
WBC: 5.7 10*3/uL (ref 4.0–10.5)
nRBC: 0 % (ref 0.0–0.2)

## 2020-05-19 MED ORDER — DIPHENHYDRAMINE HCL 25 MG PO CAPS
50.0000 mg | ORAL_CAPSULE | Freq: Once | ORAL | Status: AC
  Administered 2020-05-19: 50 mg via ORAL

## 2020-05-19 MED ORDER — SODIUM CHLORIDE 0.9 % IV SOLN
Freq: Once | INTRAVENOUS | Status: AC
  Filled 2020-05-19: qty 250

## 2020-05-19 MED ORDER — FAMOTIDINE IN NACL 20-0.9 MG/50ML-% IV SOLN
20.0000 mg | Freq: Once | INTRAVENOUS | Status: AC
  Administered 2020-05-19: 20 mg via INTRAVENOUS

## 2020-05-19 MED ORDER — MONTELUKAST SODIUM 10 MG PO TABS
10.0000 mg | ORAL_TABLET | Freq: Once | ORAL | Status: AC
  Administered 2020-05-19: 10 mg via ORAL

## 2020-05-19 MED ORDER — ACETAMINOPHEN 325 MG PO TABS
ORAL_TABLET | ORAL | Status: AC
  Filled 2020-05-19: qty 2

## 2020-05-19 MED ORDER — SODIUM CHLORIDE 0.9% FLUSH
10.0000 mL | INTRAVENOUS | Status: DC | PRN
  Administered 2020-05-19: 10 mL
  Filled 2020-05-19: qty 10

## 2020-05-19 MED ORDER — ACETAMINOPHEN 325 MG PO TABS
650.0000 mg | ORAL_TABLET | Freq: Once | ORAL | Status: AC
  Administered 2020-05-19: 650 mg via ORAL

## 2020-05-19 MED ORDER — LIDOCAINE-PRILOCAINE 2.5-2.5 % EX CREA: 1.0000 "application " | TOPICAL_CREAM | CUTANEOUS | 0 refills | Status: DC | PRN

## 2020-05-19 MED ORDER — DIPHENHYDRAMINE HCL 25 MG PO CAPS
ORAL_CAPSULE | ORAL | Status: AC
  Filled 2020-05-19: qty 2

## 2020-05-19 MED ORDER — FAMOTIDINE IN NACL 20-0.9 MG/50ML-% IV SOLN
INTRAVENOUS | Status: AC
  Filled 2020-05-19: qty 50

## 2020-05-19 MED ORDER — HEPARIN SOD (PORK) LOCK FLUSH 100 UNIT/ML IV SOLN
500.0000 [IU] | Freq: Once | INTRAVENOUS | Status: AC | PRN
  Administered 2020-05-19: 500 [IU]
  Filled 2020-05-19: qty 5

## 2020-05-19 MED ORDER — SODIUM CHLORIDE 0.9 % IV SOLN
16.0000 mg/kg | Freq: Once | INTRAVENOUS | Status: AC
  Administered 2020-05-19: 1400 mg via INTRAVENOUS
  Filled 2020-05-19: qty 60

## 2020-05-19 MED ORDER — SODIUM CHLORIDE 0.9 % IV SOLN
20.0000 mg | Freq: Once | INTRAVENOUS | Status: AC
  Administered 2020-05-19: 20 mg via INTRAVENOUS
  Filled 2020-05-19: qty 20

## 2020-05-19 MED ORDER — SODIUM CHLORIDE 0.9% FLUSH
10.0000 mL | Freq: Once | INTRAVENOUS | Status: AC | PRN
  Administered 2020-05-19: 10 mL
  Filled 2020-05-19: qty 10

## 2020-05-19 MED ORDER — MONTELUKAST SODIUM 10 MG PO TABS
ORAL_TABLET | ORAL | Status: AC
  Filled 2020-05-19: qty 1

## 2020-05-19 NOTE — Patient Instructions (Signed)
[facility] Discharge Instructions for Patients Receiving Chemotherapy  Today you received the following chemotherapy agents: Darzalex  To help prevent nausea and vomiting after your treatment, we encourage you to take your nausea medication as directed.    If you develop nausea and vomiting that is not controlled by your nausea medication, call the clinic.   BELOW ARE SYMPTOMS THAT SHOULD BE REPORTED IMMEDIATELY:  *FEVER GREATER THAN 100.5 F  *CHILLS WITH OR WITHOUT FEVER  NAUSEA AND VOMITING THAT IS NOT CONTROLLED WITH YOUR NAUSEA MEDICATION  *UNUSUAL SHORTNESS OF BREATH  *UNUSUAL BRUISING OR BLEEDING  TENDERNESS IN MOUTH AND THROAT WITH OR WITHOUT PRESENCE OF ULCERS  *URINARY PROBLEMS  *BOWEL PROBLEMS  UNUSUAL RASH Items with * indicate a potential emergency and should be followed up as soon as possible.  Feel free to call the clinic should you have any questions or concerns. The clinic phone number is [phone].  Please show the CHEMO ALERT CARD at check-in to the Emergency Department and triage nurse.   

## 2020-05-19 NOTE — Patient Instructions (Signed)

## 2020-05-22 LAB — MULTIPLE MYELOMA PANEL, SERUM
Albumin SerPl Elph-Mcnc: 3.2 g/dL (ref 2.9–4.4)
Albumin/Glob SerPl: 0.8 (ref 0.7–1.7)
Alpha 1: 0.2 g/dL (ref 0.0–0.4)
Alpha2 Glob SerPl Elph-Mcnc: 1.2 g/dL — ABNORMAL HIGH (ref 0.4–1.0)
B-Globulin SerPl Elph-Mcnc: 0.9 g/dL (ref 0.7–1.3)
Gamma Glob SerPl Elph-Mcnc: 2 g/dL — ABNORMAL HIGH (ref 0.4–1.8)
Globulin, Total: 4.3 g/dL — ABNORMAL HIGH (ref 2.2–3.9)
IgA: 63 mg/dL — ABNORMAL LOW (ref 64–422)
IgG (Immunoglobin G), Serum: 2405 mg/dL — ABNORMAL HIGH (ref 586–1602)
IgM (Immunoglobulin M), Srm: 52 mg/dL (ref 26–217)
M Protein SerPl Elph-Mcnc: 1.8 g/dL — ABNORMAL HIGH
Total Protein ELP: 7.5 g/dL (ref 6.0–8.5)

## 2020-05-23 ENCOUNTER — Encounter: Payer: Medicare Other | Admitting: Gastroenterology

## 2020-05-26 ENCOUNTER — Other Ambulatory Visit: Payer: Self-pay | Admitting: Hematology and Oncology

## 2020-05-26 ENCOUNTER — Encounter: Payer: Self-pay | Admitting: Gastroenterology

## 2020-05-26 DIAGNOSIS — Z853 Personal history of malignant neoplasm of breast: Secondary | ICD-10-CM

## 2020-05-30 ENCOUNTER — Encounter: Payer: Medicare Other | Admitting: Gastroenterology

## 2020-05-30 ENCOUNTER — Telehealth: Payer: Self-pay | Admitting: Gastroenterology

## 2020-05-30 NOTE — Telephone Encounter (Signed)
The pt has been advised that a letter has been mailed however, we did discuss the contents and all questions answered   Aubry A Yeats [address] [place] [address]   Dear Ms. Diggs,  The biopsies taken during your recent upper endoscopy showed no sign of infection or cancer.  You should continue to follow the recommendations that we discussed at the time of your procedure.   If you have any questions or concerns, please don't hesitate to call.  Sincerely,    Milus Banister, MD  [facility]                       [place]endi Maya, [phone]                      1

## 2020-06-02 ENCOUNTER — Inpatient Hospital Stay: Payer: Medicare Other | Attending: Hematology

## 2020-06-02 ENCOUNTER — Inpatient Hospital Stay: Payer: Medicare Other

## 2020-06-02 ENCOUNTER — Other Ambulatory Visit: Payer: Self-pay

## 2020-06-02 ENCOUNTER — Other Ambulatory Visit: Payer: Medicare Other

## 2020-06-02 ENCOUNTER — Inpatient Hospital Stay: Payer: Medicare Other | Admitting: Hematology

## 2020-06-02 VITALS — BP 151/57 | HR 72 | Temp 98.9°F | Resp 16

## 2020-06-02 VITALS — BP 145/58 | HR 68 | Temp 96.4°F | Resp 18 | Ht 60.0 in | Wt 182.9 lb

## 2020-06-02 DIAGNOSIS — I252 Old myocardial infarction: Secondary | ICD-10-CM | POA: Diagnosis not present

## 2020-06-02 DIAGNOSIS — D509 Iron deficiency anemia, unspecified: Secondary | ICD-10-CM | POA: Diagnosis not present

## 2020-06-02 DIAGNOSIS — C9000 Multiple myeloma not having achieved remission: Secondary | ICD-10-CM

## 2020-06-02 DIAGNOSIS — E1122 Type 2 diabetes mellitus with diabetic chronic kidney disease: Secondary | ICD-10-CM | POA: Diagnosis not present

## 2020-06-02 DIAGNOSIS — N189 Chronic kidney disease, unspecified: Secondary | ICD-10-CM | POA: Diagnosis not present

## 2020-06-02 DIAGNOSIS — Z853 Personal history of malignant neoplasm of breast: Secondary | ICD-10-CM | POA: Insufficient documentation

## 2020-06-02 DIAGNOSIS — E1140 Type 2 diabetes mellitus with diabetic neuropathy, unspecified: Secondary | ICD-10-CM | POA: Insufficient documentation

## 2020-06-02 DIAGNOSIS — Z5112 Encounter for antineoplastic immunotherapy: Secondary | ICD-10-CM | POA: Diagnosis not present

## 2020-06-02 DIAGNOSIS — Z923 Personal history of irradiation: Secondary | ICD-10-CM | POA: Insufficient documentation

## 2020-06-02 DIAGNOSIS — Z87891 Personal history of nicotine dependence: Secondary | ICD-10-CM | POA: Insufficient documentation

## 2020-06-02 DIAGNOSIS — Z7189 Other specified counseling: Secondary | ICD-10-CM

## 2020-06-02 DIAGNOSIS — Z8673 Personal history of transient ischemic attack (TIA), and cerebral infarction without residual deficits: Secondary | ICD-10-CM | POA: Diagnosis not present

## 2020-06-02 LAB — IRON AND TIBC
Iron: 95 ug/dL (ref 41–142)
Saturation Ratios: 32 % (ref 21–57)
TIBC: 300 ug/dL (ref 236–444)
UIBC: 206 ug/dL (ref 120–384)

## 2020-06-02 LAB — CBC WITH DIFFERENTIAL/PLATELET
Abs Immature Granulocytes: 0.02 10*3/uL (ref 0.00–0.07)
Basophils Absolute: 0 10*3/uL (ref 0.0–0.1)
Basophils Relative: 0 %
Eosinophils Absolute: 0 10*3/uL (ref 0.0–0.5)
Eosinophils Relative: 1 %
HCT: 26 % — ABNORMAL LOW (ref 36.0–46.0)
Hemoglobin: 8.6 g/dL — ABNORMAL LOW (ref 12.0–15.0)
Immature Granulocytes: 0 %
Lymphocytes Relative: 41 %
Lymphs Abs: 2.8 10*3/uL (ref 0.7–4.0)
MCH: 32.2 pg (ref 26.0–34.0)
MCHC: 33.1 g/dL (ref 30.0–36.0)
MCV: 97.4 fL (ref 80.0–100.0)
Monocytes Absolute: 0.4 10*3/uL (ref 0.1–1.0)
Monocytes Relative: 6 %
Neutro Abs: 3.7 10*3/uL (ref 1.7–7.7)
Neutrophils Relative %: 52 %
Platelets: 239 10*3/uL (ref 150–400)
RBC: 2.67 MIL/uL — ABNORMAL LOW (ref 3.87–5.11)
RDW: 14.6 % (ref 11.5–15.5)
WBC: 6.9 10*3/uL (ref 4.0–10.5)
nRBC: 0 % (ref 0.0–0.2)

## 2020-06-02 LAB — CMP (CANCER CENTER ONLY)
ALT: 8 U/L (ref 0–44)
AST: 9 U/L — ABNORMAL LOW (ref 15–41)
Albumin: 3.2 g/dL — ABNORMAL LOW (ref 3.5–5.0)
Alkaline Phosphatase: 71 U/L (ref 38–126)
Anion gap: 8 (ref 5–15)
BUN: 16 mg/dL (ref 8–23)
CO2: 24 mmol/L (ref 22–32)
Calcium: 8.7 mg/dL — ABNORMAL LOW (ref 8.9–10.3)
Chloride: 109 mmol/L (ref 98–111)
Creatinine: 1.2 mg/dL — ABNORMAL HIGH (ref 0.44–1.00)
GFR, Estimated: 47 mL/min — ABNORMAL LOW (ref >60)
Glucose, Bld: 118 mg/dL — ABNORMAL HIGH (ref 70–99)
Potassium: 4.2 mmol/L (ref 3.5–5.1)
Sodium: 141 mmol/L (ref 135–145)
Total Bilirubin: <0.2 mg/dL — ABNORMAL LOW (ref 0.3–1.2)
Total Protein: 7.9 g/dL (ref 6.5–8.1)

## 2020-06-02 LAB — FERRITIN: Ferritin: 182 ng/mL (ref 11–307)

## 2020-06-02 MED ORDER — SODIUM CHLORIDE 0.9 % IV SOLN
20.0000 mg | Freq: Once | INTRAVENOUS | Status: AC
  Administered 2020-06-02: 20 mg via INTRAVENOUS
  Filled 2020-06-02: qty 20

## 2020-06-02 MED ORDER — ZOLEDRONIC ACID 4 MG/100ML IV SOLN
4.0000 mg | Freq: Once | INTRAVENOUS | Status: AC
  Administered 2020-06-02: 4 mg via INTRAVENOUS

## 2020-06-02 MED ORDER — MONTELUKAST SODIUM 10 MG PO TABS
10.0000 mg | ORAL_TABLET | Freq: Once | ORAL | Status: AC
  Administered 2020-06-02: 10 mg via ORAL

## 2020-06-02 MED ORDER — SODIUM CHLORIDE 0.9% FLUSH
10.0000 mL | Freq: Once | INTRAVENOUS | Status: AC | PRN
  Administered 2020-06-02: 10 mL
  Filled 2020-06-02: qty 10

## 2020-06-02 MED ORDER — FAMOTIDINE IN NACL 20-0.9 MG/50ML-% IV SOLN
INTRAVENOUS | Status: AC
  Filled 2020-06-02: qty 50

## 2020-06-02 MED ORDER — DEXTROSE 5 % IV SOLN
20.0000 mg/m2 | Freq: Once | INTRAVENOUS | Status: AC
  Administered 2020-06-02: 40 mg via INTRAVENOUS
  Filled 2020-06-02: qty 15

## 2020-06-02 MED ORDER — SODIUM CHLORIDE 0.9 % IV SOLN
Freq: Once | INTRAVENOUS | Status: AC
  Filled 2020-06-02: qty 250

## 2020-06-02 MED ORDER — ZOLEDRONIC ACID 4 MG/100ML IV SOLN
INTRAVENOUS | Status: AC
  Filled 2020-06-02: qty 100

## 2020-06-02 MED ORDER — FAMOTIDINE IN NACL 20-0.9 MG/50ML-% IV SOLN
20.0000 mg | Freq: Once | INTRAVENOUS | Status: AC
  Administered 2020-06-02: 20 mg via INTRAVENOUS

## 2020-06-02 MED ORDER — ACETAMINOPHEN 325 MG PO TABS
ORAL_TABLET | ORAL | Status: AC
  Filled 2020-06-02: qty 2

## 2020-06-02 MED ORDER — DIPHENHYDRAMINE HCL 25 MG PO CAPS
ORAL_CAPSULE | ORAL | Status: AC
  Filled 2020-06-02: qty 2

## 2020-06-02 MED ORDER — ACETAMINOPHEN 325 MG PO TABS
650.0000 mg | ORAL_TABLET | Freq: Once | ORAL | Status: AC
  Administered 2020-06-02: 650 mg via ORAL

## 2020-06-02 MED ORDER — DIPHENHYDRAMINE HCL 25 MG PO CAPS
50.0000 mg | ORAL_CAPSULE | Freq: Once | ORAL | Status: AC
  Administered 2020-06-02: 50 mg via ORAL

## 2020-06-02 MED ORDER — SODIUM CHLORIDE 0.9 % IV SOLN
16.0000 mg/kg | Freq: Once | INTRAVENOUS | Status: AC
  Administered 2020-06-02: 1400 mg via INTRAVENOUS
  Filled 2020-06-02: qty 60

## 2020-06-02 MED ORDER — HEPARIN SOD (PORK) LOCK FLUSH 100 UNIT/ML IV SOLN
500.0000 [IU] | Freq: Once | INTRAVENOUS | Status: AC | PRN
  Administered 2020-06-02: 500 [IU]
  Filled 2020-06-02: qty 5

## 2020-06-02 MED ORDER — MONTELUKAST SODIUM 10 MG PO TABS
ORAL_TABLET | ORAL | Status: AC
  Filled 2020-06-02: qty 1

## 2020-06-02 MED ORDER — SODIUM CHLORIDE 0.9% FLUSH
10.0000 mL | INTRAVENOUS | Status: DC | PRN
  Administered 2020-06-02: 10 mL
  Filled 2020-06-02: qty 10

## 2020-06-02 NOTE — Progress Notes (Signed)
HEMATOLOGY/ONCOLOGY CLINIC NOTE  Date of Service: 06/02/2020  Patient Care Team: [name], MD as PCP - General (Family Medicine) [name], MD as Attending Physician (Physical Medicine and Rehabilitation) [name], MD as Consulting Physician (Orthopedic Surgery) [name], MD as Consulting Physician (Otolaryngology) [name], MD as Consulting Physician (Gastroenterology) [name], MD as Consulting Physician (General Surgery) [name], MD as Consulting Physician (Hematology and Oncology) [name], MD as Consulting Physician (Radiation Oncology) [name], NP as Nurse Practitioner (Hematology and Oncology) [name], [facility] as Pharmacist (Pharmacist)  CHIEF COMPLAINTS/PURPOSE OF CONSULTATION:  Multiple Myeloma not having achieved remission  HISTORY OF PRESENTING ILLNESS:  Tracy [name] is a wonderful 74 y.o. female who has been referred to [place] by Dr [name] for evaluation and management of Multiple Myeloma. Pt is accompanied today by her daughter in person and other daughter and granddaughter via phone. The pt reports that she is doing well overall.   When pt was first diagnosed with Breast Cancer it was localized in both of her breasts and was considered Stage 1. She then received chemotherapy. However, she declined chemotherapy a second time and chose radiation therapy after recurrence. Pt has been under the surveillance of Dr. [name] after declining antiestrogen therapy.   The pt reports that she was having difficulty breathing so she saw her Pulmonologist, who ordered the work up. Based on the results of the PET/CT pt was sent to Dr. [name] as they were concerned that her breast cancer had spread. Pt has been becoming increasingly anemic over the last year, despite using a PO Iron supplement. She has had a positive Fecal Occult tests, but has been having issues with hemorrhoids and hemorrhoidal bleeding for the last  5-6 months. Pt denies any blood in her stools but notices blood on the tissue after she wipes. Pt has also been having some discomfort in her left hip and lower back. This pain is worsened when she sits down.   Pt has had a chronic cough that has been thought to either be Bronchitis or a result of acid reflux. Pt is planning to receive an Endoscopy to work this up further. She is currently taking Gabapentin for tingling/burning in her extremities. Pt has Type II Diabetes and was previously using Metformin, but discontinued due to concern for liver damage. She is not currently using any medications to control her Diabetes.   Pt had a heart attack in 2001. She initially though that she had a [employer], until the sensation began to travel up her body. She was given Nitroglycerin upon admittance to the hospital. Pt did not require any stents or other interventions. No cause for her heart attack was ever discovered. Pt has been seen by Neurology, Dr. Leta Baptist, who completed imaging on her neck and found a degeneratve disk. Pt has not had a nerve study conducted. Pt has no history of Shingles and has not received the Shingles vaccine. She has been fully vaccinated against he COVID19 virus.    Of note prior to the patient's visit today, pt has had PET/CT ([id number]) completed on 01/20/2020 with results revealing "1. Hypermetabolism corresponding to left acetabular and L5 lytic lesions, with differential considerations of metastatic disease or myeloma. 2. No evidence of hypermetabolic soft tissue primary, metastatic disease, or soft tissue myeloma. 3. Incidental findings, including: Aortic atherosclerosis (ICD10-I70.0) and emphysema (ICD10-J43.9). Hepatic steatosis."   Pt has had Bone Marrow Surgical Pathology ([id number]) completed on 01/17/2020 which revealed "  BONE MARROW, ASPIRATE, CLOT, CORE: -Hypercellular bone marrow for age with plasma cell neoplasm PERIPHERAL BLOOD: -Normocytic-normochromic  anemia"  Pt has had Left Ischium Biopsy Surgical Pathology Report [id number]) completed on 01/17/2020 which revealed "Plasma cell neoplasm."  Most recent lab results (01/25/2020) of CBC is as follows: all values are WNL except for RBC at 2.44, Hgb at 7.5, HCT at 24.0, CO2 at 21, Glucose at 129, Creatinine at 1.38, Total Protein at 10.1, Albumin at 2.8, Total Bilirubin at <0.2, GFR Est Afr Am at 43. 01/25/2020 K/L light chains is as follows: Kappa free light chain at 22.7, Lamda free light chains at 17.7, K/L light chain ratio at 1.28 01/25/2020 MMP is as follows: all values are WNL except for IgG at 5220, Total Protein at 10.1, Alpha2 Glob at 1.2, Gamma Glob at 3.9, M Protein at 3.7, Total Globulin at 6.7, Albumin/Glob at 0.6. 01/25/2020 24-hr UPEP shows all values are WNL  On review of systems, pt reports lower back/left hip pain, unexpected weight loss, abdominal pain, loose stools and denies low appetite, constipation, rashes and any other symptoms.   On PMHx the pt reports Breast Cancer, Breast Lumpectomy, TIA, Restless leg, Myocardial infarction, HTN, HLD, Chronic Bronchitis.  INTERVAL HISTORY: Tracy [name] is a wonderful 74 y.o. female who is here for evaluation and management of Multiple Myeloma. She is here for C5D1 Daratumumab. The patient's last visit with [place] was on 05/05/2020. The pt reports that she is doing well overall.  The pt reports that she had her Colonoscopy and Endoscopy with Dr. Ardis Hughs who found no source of major GI bleeding. She is unaware of any follow up or additional testing to be done with Dr. Ardis Hughs. Pt only notices minor hemorrhoidal bleeding 1-2 times per year.  Pt tolerated her first Zometa infusion well and continues to take Ergocalciferol weekly. Pt has gotten her flu vaccine and is interested in the [place] booster.  She notes discomfort with her port that started after she lifted a heavy bag of laundry.  Lab results today (06/02/20) of CMP is as  follows: all values are WNL except for RBC at 2.67, Hgb at 8.6, HCT at 26.0, Glucose at 118, Creatinine at 1.20, Calcium at 8.7, Albumin at 3.2, AST at 9, Total Bilirubin at <0.2, GFR Est at 47. 06/02/2020 Iron Panel is as follows: all values are WNL 06/02/2020 Ferritin at 182 06/02/2020 MMP is in progress  On review of systems, pt reports discomfort at port site and denies new bone pain and any other symptoms.   MEDICAL HISTORY:  Past Medical History:  Diagnosis Date  . Anxiety   . Arthritis   . Breast cancer ([place]) 06/13/15  . Cancer ([place]) 2000   breast cancer  . Chronic bronchitis ([place])   . Chronic bronchitis ([place])   . Hyperlipidemia   . Hypertension   . Myocardial infarction ([place]) 2001  . Personal history of radiation therapy   . Restless leg   . Stroke [facility]) 2004   TIA, no deficits    SURGICAL HISTORY: Past Surgical History:  Procedure Laterality Date  . ABDOMINAL HYSTERECTOMY  1985  . BREAST LUMPECTOMY Left 2000   radiation and chemo  . BREAST LUMPECTOMY Right 2016   radiation  . BREAST SURGERY  2001   lt breast lumpectomy  . IR IMAGING GUIDED PORT INSERTION  04/25/2020  . RADIOACTIVE SEED GUIDED PARTIAL MASTECTOMY WITH AXILLARY SENTINEL LYMPH NODE BIOPSY Right 07/07/2015   Procedure: RIGHT RADIOACTIVE SEED GUIDED PARTIAL MASTECTOMY WITH  AXILLARY SENTINEL LYMPH NODE BIOPSY;  Surgeon: Autumn Messing III, MD;  Location: [place] [facility];  Service: General;  Laterality: Right;  . SMALL INTESTINE SURGERY    . TUBAL LIGATION      SOCIAL HISTORY: Social History   Socioeconomic History  . Marital status: Divorced    Spouse name: Not on file  . Number of children: 7  . Years of education: Not on file  . Highest education level: Not on file  Occupational History  . Occupation: retired  Tobacco Use  . Smoking status: Former Smoker    Packs/day: 1.00    Years: 20.00    Pack years: 20.00    Types: Cigarettes    Quit date: 07/30/2011    Years since quitting: 8.8   . Smokeless tobacco: Never Used  . Tobacco comment: Quit >4 years ago; 1 ppd for about 5/20 years (remaining was less)  Vaping Use  . Vaping Use: Former  Substance and Sexual Activity  . Alcohol use: No    Alcohol/week: 0.0 standard drinks  . Drug use: No  . Sexual activity: Not Currently  Other Topics Concern  . Not on file  Social History Narrative   Lives alone.  Retired.  Education:  11th grade GED.  Children:  7 (one here).    Social Determinants of Health   Financial Resource Strain:   . Difficulty of Paying Living Expenses: Not on file  Food Insecurity: No Food Insecurity  . Worried About Charity fundraiser in the Last Year: Never true  . Ran Out of Food in the Last Year: Never true  Transportation Needs: No Transportation Needs  . Lack of Transportation (Medical): No  . Lack of Transportation (Non-Medical): No  Physical Activity:   . Days of Exercise per Week: Not on file  . Minutes of Exercise per Session: Not on file  Stress:   . Feeling of Stress : Not on file  Social Connections:   . Frequency of Communication with Friends and Family: Not on file  . Frequency of Social Gatherings with Friends and Family: Not on file  . Attends Religious Services: Not on file  . Active Member of Clubs or Organizations: Not on file  . Attends Archivist Meetings: Not on file  . Marital Status: Not on file  Intimate Partner Violence:   . Fear of Current or Ex-Partner: Not on file  . Emotionally Abused: Not on file  . Physically Abused: Not on file  . Sexually Abused: Not on file    FAMILY HISTORY: Family History  Problem Relation Age of Onset  . Diabetes Father   . Lung cancer Sister        dx. <50; former smoker  . Diabetes Brother   . Diabetes Paternal Aunt   . Stroke Maternal Grandmother   . Diabetes Paternal Grandmother   . Emphysema Mother 56       smoker  . Diabetes Brother   . Brain cancer Brother 18       unknown tumor type  . Cancer Daughter 31        neck cancer  . Other Daughter        hysterectomy for unspecified reason  . Breast cancer Cousin   . Cancer Cousin        unspecified type  . Breast cancer Other        triple negative breast cancer in her 45s  . Cancer Daughter   . Colon polyps  Neg Hx   . Esophageal cancer Neg Hx   . Gallbladder disease Neg Hx     ALLERGIES:  is allergic to bacitracin-neomycin-polymyxin  [neomycin-bacitracin zn-polymyx], nsaids, tape, ambien [zolpidem tartrate], amoxicillin, contrast media [iodinated diagnostic agents], latex, and prednisone.  MEDICATIONS:  Current Outpatient Medications  Medication Sig Dispense Refill  . Accu-Chek Softclix Lancets lancets Use as instructed to check sugars 1-2 times daily. 100 each 12  . acyclovir (ZOVIRAX) 400 MG tablet Take 1 tablet (400 mg total) by mouth 2 (two) times daily. 60 tablet 11  . albuterol (VENTOLIN HFA) 108 (90 Base) MCG/ACT inhaler Inhale 2 puffs into the lungs every 6 (six) hours as needed for wheezing or shortness of breath. 8 g 2  . aspirin 81 MG tablet Take 81 mg by mouth daily.    Marland Kitchen atorvastatin (LIPITOR) 10 MG tablet TAKE 1 TABLET BY MOUTH  DAILY 90 tablet 3  . dexamethasone (DECADRON) 4 MG tablet Take 3 tabs with breakfast the day after each treatment. 20 tablet 4  . ergocalciferol (VITAMIN D2) 1.25 MG (50000 UT) capsule Take 1 capsule (50,000 Units total) by mouth once a week. 12 capsule 2  . ferrous sulfate 325 (65 FE) MG tablet Take 1 tablet (325 mg total) by mouth daily with breakfast. 30 tablet 6  . fluticasone (FLOVENT HFA) 110 MCG/ACT inhaler Inhale 2 puffs into the lungs in the morning and at bedtime. 1 each 12  . gabapentin (NEURONTIN) 300 MG capsule TAKE 1 CAPSULE BY MOUTH 3  TIMES DAILY 270 capsule 3  . glucose blood (ACCU-CHEK GUIDE) test strip Use as instructed to check sugars 1-2 times daily. 100 each 12  . HYDROcodone-acetaminophen (NORCO) 5-325 MG tablet Take 1 tablet by mouth every 6 (six) hours as needed for moderate  pain or severe pain. (Patient not taking: Reported on 05/16/2020) 60 tablet 0  . lidocaine-prilocaine (EMLA) cream Apply 1 application topically as needed. 30 g 0  . loratadine (CLARITIN) 10 MG tablet Take 10 mg by mouth daily.    Marland Kitchen LORazepam (ATIVAN) 0.5 MG tablet Take 1 tablet (0.5 mg total) by mouth every 6 (six) hours as needed (Nausea or vomiting). 30 tablet 0  . losartan (COZAAR) 100 MG tablet TAKE 1 TABLET BY MOUTH  DAILY 90 tablet 3  . MELATONIN PO Take by mouth.     . metFORMIN (GLUCOPHAGE) 500 MG tablet TAKE 1 TABLET(500 MG) BY MOUTH TWICE DAILY WITH A MEAL 180 tablet 0  . mometasone (NASONEX) 50 MCG/ACT nasal spray Place 2 sprays into the nose daily. 17 g 12  . ondansetron (ZOFRAN) 8 MG tablet Take 1 tablet (8 mg total) by mouth 2 (two) times daily as needed (Nausea or vomiting). 30 tablet 1  . pantoprazole (PROTONIX) 40 MG tablet TAKE 1 TABLET(40 MG) BY MOUTH TWICE DAILY 90 tablet 1  . prochlorperazine (COMPAZINE) 10 MG tablet Take 1 tablet (10 mg total) by mouth every 6 (six) hours as needed (Nausea or vomiting). 30 tablet 1  . sertraline (ZOLOFT) 50 MG tablet TAKE 1 TABLET(50 MG) BY MOUTH DAILY 90 tablet 0  . tiZANidine (ZANAFLEX) 4 MG tablet TAKE 1 TABLET(4 MG) BY MOUTH AT BEDTIME 30 tablet 3  . traZODone (DESYREL) 100 MG tablet TAKE 1 TABLET BY MOUTH AT  BEDTIME 90 tablet 3   No current facility-administered medications for this visit.    REVIEW OF SYSTEMS:   A 10+ POINT REVIEW OF SYSTEMS WAS OBTAINED including neurology, dermatology, psychiatry, cardiac, respiratory, lymph, extremities, GI,  GU, Musculoskeletal, constitutional, breasts, reproductive, HEENT.  All pertinent positives are noted in the HPI.  All others are negative.   PHYSICAL EXAMINATION: ECOG PERFORMANCE STATUS: 2 - Symptomatic, <50% confined to bed  . VS reviewed Exam was given in a chair   Exam was given in a wheelchair   GENERAL:alert, in no acute distress and comfortable SKIN: no acute rashes, no  significant lesions EYES: conjunctiva are pink and non-injected, sclera anicteric OROPHARYNX: MMM, no exudates, no oropharyngeal erythema or ulceration NECK: supple, no JVD LYMPH:  no palpable lymphadenopathy in the cervical, axillary or inguinal regions LUNGS: clear to auscultation b/l with normal respiratory effort HEART: regular rate & rhythm ABDOMEN:  normoactive bowel sounds , non tender, not distended. No palpable hepatosplenomegaly.  Extremity: no pedal edema PSYCH: alert & oriented x 3 with fluent speech NEURO: no focal motor/sensory deficits  LABORATORY DATA:  I have reviewed the data as listed  . CBC Latest Ref Rng & Units 05/19/2020 05/05/2020 04/25/2020  WBC 4.0 - 10.5 K/uL 5.7 6.6 7.1  Hemoglobin 12.0 - 15.0 g/dL 8.3(L) 8.4(L) 8.7(L)  Hematocrit 36 - 46 % 25.8(L) 25.2(L) 26.3(L)  Platelets 150 - 400 K/uL 292 276 341    . CMP Latest Ref Rng & Units 05/19/2020 05/05/2020 04/21/2020  Glucose 70 - 99 mg/dL 108(H) 135(H) 105(H)  BUN 8 - 23 mg/dL _0 Creatinine 0.44 - 1.00 mg/dL 1.33(H) 1.05(H) 1.42(H)  Sodium 135 - 145 mmol/L 141 138 139  Potassium 3.5 - 5.1 mmol/L 4.0 4.0 4.0  Chloride 98 - 111 mmol/L 111 106 109  CO2 22 - 32 mmol/L _1 Calcium 8.9 - 10.3 mg/dL 9.1 9.0 8.1(L)  Total Protein 6.5 - 8.1 g/dL 7.9 8.1 8.3(H)  Total Bilirubin 0.3 - 1.2 mg/dL <0.2(L) 0.2(L) <0.2(L)  Alkaline Phos 38 - 126 U/L 89 91 94  AST 15 - 41 U/L 10(L) 11(L) 15  ALT 0 - 44 U/L _2 05/16/2020 Upper Endoscopy:   05/16/2020 Colonoscopy:   01/25/2020 K/L light chains:    01/25/2020 MMP:            RADIOGRAPHIC STUDIES: I have personally reviewed the radiological images as listed and agreed with the findings in the report. No results found.  ASSESSMENT & PLAN:   74 yo with   1) Newly diagnosed IgG Kappa Multiple myeloma with bone lesions, anemia, renal insuff. M spike @ 3.7g/dl 1p deletion, polymorphic variant, 13q deletion 2) h/o Dm2 3)  Diabetic Neuropathy 4) CKD - likely from DM2, but could have an element of myeloma kidney. 5) h/o TIA and AMI 6) Iron deficiency   PLAN: -Discussed pt labwork today, 06/02/20; all values are WNL except for RBC at 2.67, Hgb at 8.6, HCT at 26.0, Glucose at 118, Creatinine at 1.20, Calcium at 8.7, Albumin at 3.2, AST at 9, Total Bilirubin at <0.2, GFR Est at 47. -Discussed 06/02/2020 Iron Panel is as follows: all values are WNL, Ferritin at 182. -Discussed 06/02/2020 MMP is in progress, 05/19/2020 MMP shows M Protein has plateaued at 1.8 g/dL. -The pt has no prohibitive toxicities from continuing [place] necessitating treatment change at this time.  -Advised pt that she has reached partial response, but the response is stagnant. To continue treating aggressively will add Carfilzomib. -Advised pt that Carflizomib can increase tingling/numbness in the extremities, cytopenias, and carries an increased risk of infections.  -Advised pt again that she has high-risk genetics, which  is why we are treating so aggressively.  -Discussed CDC guidelines regarding the COVID19 booster. Recommend pt seek the Moderna booster.  -Advised pt that her age, Multiple Myeloma, and treatment are all risk factors for infections.  -Counseled pt on proper safety precautions, especially with family gatherings.  -Continue Zometa   -Will see back in 2 weeks with labs    FOLLOW UP: Plz add C5D8 carfilzomib with portflush and labs C5D15 Dara/Carfilzomib portflush, labs and MD visit   The total time spent in the appt was 30 minutes and more than 50% was on counseling and direct patient cares.  All of the patient's questions were answered with apparent satisfaction. The patient knows to call the clinic with any problems, questions or concerns.   [name] MD [place] AAHIVMS [facility] Hematology/Oncology Physician [facility]  (Office):       [phone] (Work cell):   [phone] (Fax):           [phone]  06/02/2020 7:44 AM   I, Yevette Edwards, am acting as a scribe for Dr. [name].   .I have reviewed the above documentation for accuracy and completeness, and I agree with the above. [name] MD

## 2020-06-02 NOTE — Progress Notes (Signed)
Per pharmacy, ok to give daratumumab prior to carfilzomib today to prevent delays from waiting on prior auth.

## 2020-06-02 NOTE — Patient Instructions (Addendum)
[facility] Discharge Instructions for Patients Receiving Chemotherapy  Today you received the following chemotherapy agents: carfilzomib/daratumumab.  To help prevent nausea and vomiting after your treatment, we encourage you to take your nausea medication as directed.   If you develop nausea and vomiting that is not controlled by your nausea medication, call the clinic.   BELOW ARE SYMPTOMS THAT SHOULD BE REPORTED IMMEDIATELY:  *FEVER GREATER THAN 100.5 F  *CHILLS WITH OR WITHOUT FEVER  NAUSEA AND VOMITING THAT IS NOT CONTROLLED WITH YOUR NAUSEA MEDICATION  *UNUSUAL SHORTNESS OF BREATH  *UNUSUAL BRUISING OR BLEEDING  TENDERNESS IN MOUTH AND THROAT WITH OR WITHOUT PRESENCE OF ULCERS  *URINARY PROBLEMS  *BOWEL PROBLEMS  UNUSUAL RASH Items with * indicate a potential emergency and should be followed up as soon as possible.  Feel free to call the clinic should you have any questions or concerns. The clinic phone number is [phone].  Please show the [place] at check-in to the Emergency Department and triage nurse.  Carfilzomib injection What is this medicine? CARFILZOMIB (kar FILZ oh mib) targets a specific protein within cancer cells and stops the cancer cells from growing. It is used to treat multiple myeloma. This medicine may be used for other purposes; ask your health care provider or pharmacist if you have questions. COMMON BRAND NAME(S): KYPROLIS What should I tell my health care provider before I take this medicine? They need to know if you have any of these conditions:  heart disease  history of blood clots  irregular heartbeat  kidney disease  liver disease  lung or breathing disease  an unusual or allergic reaction to carfilzomib, or other medicines, foods, dyes, or preservatives  pregnant or trying to get pregnant  breast-feeding How should I use this medicine? This medicine is for injection or infusion into a vein. It  is given by a health care professional in a hospital or clinic setting. Talk to your pediatrician regarding the use of this medicine in children. Special care may be needed. Overdosage: If you think you have taken too much of this medicine contact a poison control center or emergency room at once. NOTE: This medicine is only for you. Do not share this medicine with others. What if I miss a dose? It is important not to miss your dose. Call your doctor or health care professional if you are unable to keep an appointment. What may interact with this medicine? Interactions are not expected. Give your health care provider a list of all the medicines, herbs, non-prescription drugs, or dietary supplements you use. Also tell them if you smoke, drink alcohol, or use illegal drugs. Some items may interact with your medicine. This list may not describe all possible interactions. Give your health care provider a list of all the medicines, herbs, non-prescription drugs, or dietary supplements you use. Also tell them if you smoke, drink alcohol, or use illegal drugs. Some items may interact with your medicine. What should I watch for while using this medicine? Your condition will be monitored carefully while you are receiving this medicine. Report any side effects. Continue your course of treatment even though you feel ill unless your doctor tells you to stop. You may need blood work done while you are taking this medicine. Do not become pregnant while taking this medicine or for at least 6 months after stopping it. Women should inform their doctor if they wish to become pregnant or think they might be pregnant. There is a  potential for serious side effects to an unborn child. Men should not father a child while taking this medicine and for at least 3 months after stopping it. Talk to your health care professional or pharmacist for more information. Do not breast-feed an infant while taking this medicine or for 2  weeks after the last dose. Check with your doctor or health care professional if you get an attack of severe diarrhea, nausea and vomiting, or if you sweat a lot. The loss of too much body fluid can make it dangerous for you to take this medicine. You may get dizzy. Do not drive, use machinery, or do anything that needs mental alertness until you know how this medicine affects you. Do not stand or sit up quickly, especially if you are an older patient. This reduces the risk of dizzy or fainting spells. What side effects may I notice from receiving this medicine? Side effects that you should report to your doctor or health care professional as soon as possible:  allergic reactions like skin rash, itching or hives, swelling of the face, lips, or tongue  confusion  dizziness  feeling faint or lightheaded  fever or chills  palpitations  seizures  signs and symptoms of bleeding such as bloody or black, tarry stools; red or dark-brown urine; spitting up blood or brown material that looks like coffee grounds; red spots on the skin; unusual bruising or bleeding including from the eye, gums, or nose  signs and symptoms of a blood clot such as breathing problems; changes in vision; chest pain; severe, sudden headache; pain, swelling, warmth in the leg; trouble speaking; sudden numbness or weakness of the face, arm or leg  signs and symptoms of kidney injury like trouble passing urine or change in the amount of urine  signs and symptoms of liver injury like dark yellow or brown urine; general ill feeling or flu-like symptoms; light-colored stools; loss of appetite; nausea; right upper belly pain; unusually weak or tired; yellowing of the eyes or skin Side effects that usually do not require medical attention (report to your doctor or health care professional if they continue or are bothersome):  back pain  cough  diarrhea  headache  muscle cramps  trouble sleeping  vomiting This list  may not describe all possible side effects. Call your doctor for medical advice about side effects. You may report side effects to FDA at 1-800-FDA-1088. Where should I keep my medicine? This drug is given in a hospital or clinic and will not be stored at home. NOTE: This sheet is a summary. It may not cover all possible information. If you have questions about this medicine, talk to your doctor, pharmacist, or health care provider.  2020 Elsevier/Gold Standard (2019-03-22 19:44:21)

## 2020-06-05 ENCOUNTER — Other Ambulatory Visit: Payer: Self-pay | Admitting: Family Medicine

## 2020-06-05 LAB — MULTIPLE MYELOMA PANEL, SERUM
Albumin SerPl Elph-Mcnc: 3.6 g/dL (ref 2.9–4.4)
Albumin/Glob SerPl: 0.9 (ref 0.7–1.7)
Alpha 1: 0.2 g/dL (ref 0.0–0.4)
Alpha2 Glob SerPl Elph-Mcnc: 1.2 g/dL — ABNORMAL HIGH (ref 0.4–1.0)
B-Globulin SerPl Elph-Mcnc: 1 g/dL (ref 0.7–1.3)
Gamma Glob SerPl Elph-Mcnc: 2.1 g/dL — ABNORMAL HIGH (ref 0.4–1.8)
Globulin, Total: 4.5 g/dL — ABNORMAL HIGH (ref 2.2–3.9)
IgA: 66 mg/dL (ref 64–422)
IgG (Immunoglobin G), Serum: 2627 mg/dL — ABNORMAL HIGH (ref 586–1602)
IgM (Immunoglobulin M), Srm: 54 mg/dL (ref 26–217)
M Protein SerPl Elph-Mcnc: 1.9 g/dL — ABNORMAL HIGH
Total Protein ELP: 8.1 g/dL (ref 6.0–8.5)

## 2020-06-08 ENCOUNTER — Other Ambulatory Visit: Payer: Self-pay

## 2020-06-08 ENCOUNTER — Inpatient Hospital Stay: Payer: Medicare Other

## 2020-06-08 VITALS — BP 132/59 | HR 81 | Temp 98.0°F | Resp 16 | Wt 184.2 lb

## 2020-06-08 DIAGNOSIS — Z853 Personal history of malignant neoplasm of breast: Secondary | ICD-10-CM | POA: Diagnosis not present

## 2020-06-08 DIAGNOSIS — Z8673 Personal history of transient ischemic attack (TIA), and cerebral infarction without residual deficits: Secondary | ICD-10-CM | POA: Diagnosis not present

## 2020-06-08 DIAGNOSIS — E1122 Type 2 diabetes mellitus with diabetic chronic kidney disease: Secondary | ICD-10-CM | POA: Diagnosis not present

## 2020-06-08 DIAGNOSIS — C9000 Multiple myeloma not having achieved remission: Secondary | ICD-10-CM

## 2020-06-08 DIAGNOSIS — N189 Chronic kidney disease, unspecified: Secondary | ICD-10-CM | POA: Diagnosis not present

## 2020-06-08 DIAGNOSIS — D509 Iron deficiency anemia, unspecified: Secondary | ICD-10-CM

## 2020-06-08 DIAGNOSIS — E1140 Type 2 diabetes mellitus with diabetic neuropathy, unspecified: Secondary | ICD-10-CM | POA: Diagnosis not present

## 2020-06-08 DIAGNOSIS — Z7189 Other specified counseling: Secondary | ICD-10-CM

## 2020-06-08 DIAGNOSIS — Z923 Personal history of irradiation: Secondary | ICD-10-CM | POA: Diagnosis not present

## 2020-06-08 DIAGNOSIS — I252 Old myocardial infarction: Secondary | ICD-10-CM | POA: Diagnosis not present

## 2020-06-08 DIAGNOSIS — Z87891 Personal history of nicotine dependence: Secondary | ICD-10-CM | POA: Diagnosis not present

## 2020-06-08 DIAGNOSIS — Z5112 Encounter for antineoplastic immunotherapy: Secondary | ICD-10-CM | POA: Diagnosis not present

## 2020-06-08 LAB — BASIC METABOLIC PANEL
Anion gap: 10 (ref 5–15)
BUN: 20 mg/dL (ref 8–23)
CO2: 23 mmol/L (ref 22–32)
Calcium: 8.6 mg/dL — ABNORMAL LOW (ref 8.9–10.3)
Chloride: 106 mmol/L (ref 98–111)
Creatinine, Ser: 1.31 mg/dL — ABNORMAL HIGH (ref 0.44–1.00)
GFR, Estimated: 42 mL/min — ABNORMAL LOW (ref >60)
Glucose, Bld: 68 mg/dL — ABNORMAL LOW (ref 70–99)
Potassium: 3.9 mmol/L (ref 3.5–5.1)
Sodium: 139 mmol/L (ref 135–145)

## 2020-06-08 MED ORDER — SODIUM CHLORIDE 0.9 % IV SOLN
Freq: Once | INTRAVENOUS | Status: AC
  Filled 2020-06-08: qty 250

## 2020-06-08 MED ORDER — HEPARIN SOD (PORK) LOCK FLUSH 100 UNIT/ML IV SOLN
500.0000 [IU] | Freq: Once | INTRAVENOUS | Status: AC | PRN
  Administered 2020-06-08: 500 [IU]
  Filled 2020-06-08: qty 5

## 2020-06-08 MED ORDER — DEXTROSE 5 % IV SOLN
27.0000 mg/m2 | Freq: Once | INTRAVENOUS | Status: AC
  Administered 2020-06-08: 50 mg via INTRAVENOUS
  Filled 2020-06-08: qty 15

## 2020-06-08 MED ORDER — FAMOTIDINE IN NACL 20-0.9 MG/50ML-% IV SOLN
INTRAVENOUS | Status: AC
  Filled 2020-06-08: qty 50

## 2020-06-08 MED ORDER — DIPHENHYDRAMINE HCL 25 MG PO CAPS
50.0000 mg | ORAL_CAPSULE | Freq: Once | ORAL | Status: AC
  Administered 2020-06-08: 50 mg via ORAL

## 2020-06-08 MED ORDER — DIPHENHYDRAMINE HCL 25 MG PO CAPS
ORAL_CAPSULE | ORAL | Status: AC
  Filled 2020-06-08: qty 2

## 2020-06-08 MED ORDER — ACETAMINOPHEN 325 MG PO TABS
ORAL_TABLET | ORAL | Status: AC
  Filled 2020-06-08: qty 2

## 2020-06-08 MED ORDER — FAMOTIDINE IN NACL 20-0.9 MG/50ML-% IV SOLN
20.0000 mg | Freq: Once | INTRAVENOUS | Status: AC
  Administered 2020-06-08: 20 mg via INTRAVENOUS

## 2020-06-08 MED ORDER — SODIUM CHLORIDE 0.9 % IV SOLN
20.0000 mg | Freq: Once | INTRAVENOUS | Status: AC
  Administered 2020-06-08: 20 mg via INTRAVENOUS
  Filled 2020-06-08: qty 20

## 2020-06-08 MED ORDER — SODIUM CHLORIDE 0.9% FLUSH
10.0000 mL | INTRAVENOUS | Status: DC | PRN
  Administered 2020-06-08: 10 mL
  Filled 2020-06-08: qty 10

## 2020-06-08 MED ORDER — ACETAMINOPHEN 325 MG PO TABS
650.0000 mg | ORAL_TABLET | Freq: Once | ORAL | Status: AC
  Administered 2020-06-08: 650 mg via ORAL

## 2020-06-08 MED ORDER — SODIUM CHLORIDE 0.9% FLUSH
10.0000 mL | Freq: Once | INTRAVENOUS | Status: AC | PRN
  Administered 2020-06-08: 10 mL
  Filled 2020-06-08: qty 10

## 2020-06-08 NOTE — Progress Notes (Signed)
Obtain labs today but do not need resulted to start treatment.  T.O. Dr Cleda Clarks, PharmD

## 2020-06-08 NOTE — Patient Instructions (Signed)
[facility] Discharge Instructions for Patients Receiving Chemotherapy  Today you received the following chemotherapy agents: carfilzomib/daratumumab.  To help prevent nausea and vomiting after your treatment, we encourage you to take your nausea medication as directed.   If you develop nausea and vomiting that is not controlled by your nausea medication, call the clinic.   BELOW ARE SYMPTOMS THAT SHOULD BE REPORTED IMMEDIATELY:  *FEVER GREATER THAN 100.5 F  *CHILLS WITH OR WITHOUT FEVER  NAUSEA AND VOMITING THAT IS NOT CONTROLLED WITH YOUR NAUSEA MEDICATION  *UNUSUAL SHORTNESS OF BREATH  *UNUSUAL BRUISING OR BLEEDING  TENDERNESS IN MOUTH AND THROAT WITH OR WITHOUT PRESENCE OF ULCERS  *URINARY PROBLEMS  *BOWEL PROBLEMS  UNUSUAL RASH Items with * indicate a potential emergency and should be followed up as soon as possible.  Feel free to call the clinic should you have any questions or concerns. The clinic phone number is [phone].  Please show the [facility] at check-in to the Emergency Department and triage nurse.

## 2020-06-15 ENCOUNTER — Ambulatory Visit: Payer: Medicare Other

## 2020-06-15 ENCOUNTER — Other Ambulatory Visit: Payer: Self-pay

## 2020-06-15 NOTE — Chronic Care Management (AMB) (Signed)
  Chronic Care Management   Outreach Note   Name: Tracy [name] MRN: [id number] DOB: [birth year]  Referred by: [name], MD Reason for referral: Telephone Appointment with [facility] Pharmacist, [name].   An unsuccessful telephone outreach was attempted today. The patient was referred to the pharmacist for assistance with care management and care coordination.   Telephone appointment with clinical pharmacist today (06/15/2020) at 2pm.  If patient immediately returns call, please transfer to [phone].   [name], Pharm.D., BCGP Clinical Pharmacist [facility] [phone]

## 2020-06-16 ENCOUNTER — Inpatient Hospital Stay: Payer: Medicare Other | Admitting: Hematology

## 2020-06-16 ENCOUNTER — Other Ambulatory Visit: Payer: Self-pay

## 2020-06-16 ENCOUNTER — Inpatient Hospital Stay: Payer: Medicare Other

## 2020-06-16 ENCOUNTER — Other Ambulatory Visit: Payer: Medicare Other

## 2020-06-16 VITALS — BP 134/62 | HR 70 | Temp 97.1°F | Resp 17 | Ht 60.0 in | Wt 182.7 lb

## 2020-06-16 VITALS — BP 145/60 | HR 78 | Temp 98.2°F | Resp 18

## 2020-06-16 DIAGNOSIS — C9000 Multiple myeloma not having achieved remission: Secondary | ICD-10-CM | POA: Diagnosis not present

## 2020-06-16 DIAGNOSIS — N189 Chronic kidney disease, unspecified: Secondary | ICD-10-CM | POA: Diagnosis not present

## 2020-06-16 DIAGNOSIS — D509 Iron deficiency anemia, unspecified: Secondary | ICD-10-CM

## 2020-06-16 DIAGNOSIS — Z853 Personal history of malignant neoplasm of breast: Secondary | ICD-10-CM | POA: Diagnosis not present

## 2020-06-16 DIAGNOSIS — Z5112 Encounter for antineoplastic immunotherapy: Secondary | ICD-10-CM | POA: Diagnosis not present

## 2020-06-16 DIAGNOSIS — E1122 Type 2 diabetes mellitus with diabetic chronic kidney disease: Secondary | ICD-10-CM | POA: Diagnosis not present

## 2020-06-16 DIAGNOSIS — Z923 Personal history of irradiation: Secondary | ICD-10-CM | POA: Diagnosis not present

## 2020-06-16 DIAGNOSIS — Z7189 Other specified counseling: Secondary | ICD-10-CM

## 2020-06-16 DIAGNOSIS — Z8673 Personal history of transient ischemic attack (TIA), and cerebral infarction without residual deficits: Secondary | ICD-10-CM | POA: Diagnosis not present

## 2020-06-16 DIAGNOSIS — I252 Old myocardial infarction: Secondary | ICD-10-CM | POA: Diagnosis not present

## 2020-06-16 DIAGNOSIS — Z87891 Personal history of nicotine dependence: Secondary | ICD-10-CM | POA: Diagnosis not present

## 2020-06-16 DIAGNOSIS — E1140 Type 2 diabetes mellitus with diabetic neuropathy, unspecified: Secondary | ICD-10-CM | POA: Diagnosis not present

## 2020-06-16 LAB — CBC WITH DIFFERENTIAL/PLATELET
Abs Immature Granulocytes: 0.03 10*3/uL (ref 0.00–0.07)
Basophils Absolute: 0 10*3/uL (ref 0.0–0.1)
Basophils Relative: 0 %
Eosinophils Absolute: 0.1 10*3/uL (ref 0.0–0.5)
Eosinophils Relative: 2 %
HCT: 24.7 % — ABNORMAL LOW (ref 36.0–46.0)
Hemoglobin: 8.2 g/dL — ABNORMAL LOW (ref 12.0–15.0)
Immature Granulocytes: 1 %
Lymphocytes Relative: 36 %
Lymphs Abs: 2.2 10*3/uL (ref 0.7–4.0)
MCH: 32.8 pg (ref 26.0–34.0)
MCHC: 33.2 g/dL (ref 30.0–36.0)
MCV: 98.8 fL (ref 80.0–100.0)
Monocytes Absolute: 0.4 10*3/uL (ref 0.1–1.0)
Monocytes Relative: 6 %
Neutro Abs: 3.4 10*3/uL (ref 1.7–7.7)
Neutrophils Relative %: 55 %
Platelets: 262 10*3/uL (ref 150–400)
RBC: 2.5 MIL/uL — ABNORMAL LOW (ref 3.87–5.11)
RDW: 14.5 % (ref 11.5–15.5)
WBC: 6.1 10*3/uL (ref 4.0–10.5)
nRBC: 0 % (ref 0.0–0.2)

## 2020-06-16 LAB — CMP (CANCER CENTER ONLY)
ALT: 7 U/L (ref 0–44)
AST: 10 U/L — ABNORMAL LOW (ref 15–41)
Albumin: 3.3 g/dL — ABNORMAL LOW (ref 3.5–5.0)
Alkaline Phosphatase: 76 U/L (ref 38–126)
Anion gap: 9 (ref 5–15)
BUN: 21 mg/dL (ref 8–23)
CO2: 21 mmol/L — ABNORMAL LOW (ref 22–32)
Calcium: 8.8 mg/dL — ABNORMAL LOW (ref 8.9–10.3)
Chloride: 110 mmol/L (ref 98–111)
Creatinine: 1.52 mg/dL — ABNORMAL HIGH (ref 0.44–1.00)
GFR, Estimated: 35 mL/min — ABNORMAL LOW (ref >60)
Glucose, Bld: 113 mg/dL — ABNORMAL HIGH (ref 70–99)
Potassium: 4.2 mmol/L (ref 3.5–5.1)
Sodium: 140 mmol/L (ref 135–145)
Total Bilirubin: 0.2 mg/dL — ABNORMAL LOW (ref 0.3–1.2)
Total Protein: 7.5 g/dL (ref 6.5–8.1)

## 2020-06-16 MED ORDER — ACETAMINOPHEN 325 MG PO TABS
ORAL_TABLET | ORAL | Status: AC
  Filled 2020-06-16: qty 2

## 2020-06-16 MED ORDER — DEXTROSE 5 % IV SOLN
36.0000 mg/m2 | Freq: Once | INTRAVENOUS | Status: AC
  Administered 2020-06-16: 70 mg via INTRAVENOUS
  Filled 2020-06-16: qty 30

## 2020-06-16 MED ORDER — DIPHENHYDRAMINE HCL 25 MG PO CAPS
ORAL_CAPSULE | ORAL | Status: AC
  Filled 2020-06-16: qty 2

## 2020-06-16 MED ORDER — DIPHENHYDRAMINE HCL 25 MG PO CAPS
50.0000 mg | ORAL_CAPSULE | Freq: Once | ORAL | Status: AC
  Administered 2020-06-16: 50 mg via ORAL

## 2020-06-16 MED ORDER — SODIUM CHLORIDE 0.9 % IV SOLN
16.0000 mg/kg | Freq: Once | INTRAVENOUS | Status: AC
  Administered 2020-06-16: 1400 mg via INTRAVENOUS
  Filled 2020-06-16: qty 60

## 2020-06-16 MED ORDER — SODIUM CHLORIDE 0.9 % IV SOLN
Freq: Once | INTRAVENOUS | Status: AC
  Filled 2020-06-16: qty 250

## 2020-06-16 MED ORDER — FAMOTIDINE IN NACL 20-0.9 MG/50ML-% IV SOLN
INTRAVENOUS | Status: AC
  Filled 2020-06-16: qty 50

## 2020-06-16 MED ORDER — FAMOTIDINE IN NACL 20-0.9 MG/50ML-% IV SOLN
20.0000 mg | Freq: Once | INTRAVENOUS | Status: AC
  Administered 2020-06-16: 20 mg via INTRAVENOUS

## 2020-06-16 MED ORDER — SODIUM CHLORIDE 0.9% FLUSH
3.0000 mL | Freq: Once | INTRAVENOUS | Status: AC | PRN
  Administered 2020-06-16: 3 mL
  Filled 2020-06-16: qty 10

## 2020-06-16 MED ORDER — SODIUM CHLORIDE 0.9 % IV SOLN
20.0000 mg | Freq: Once | INTRAVENOUS | Status: AC
  Administered 2020-06-16: 20 mg via INTRAVENOUS
  Filled 2020-06-16: qty 20

## 2020-06-16 MED ORDER — ACETAMINOPHEN 325 MG PO TABS
650.0000 mg | ORAL_TABLET | Freq: Once | ORAL | Status: AC
  Administered 2020-06-16: 650 mg via ORAL

## 2020-06-16 NOTE — Patient Instructions (Signed)
[facility] Discharge Instructions for Patients Receiving Chemotherapy  Today you received the following chemotherapy agents: carfilzomib/daratumumab.  To help prevent nausea and vomiting after your treatment, we encourage you to take your nausea medication as directed.   If you develop nausea and vomiting that is not controlled by your nausea medication, call the clinic.   BELOW ARE SYMPTOMS THAT SHOULD BE REPORTED IMMEDIATELY:  *FEVER GREATER THAN 100.5 F  *CHILLS WITH OR WITHOUT FEVER  NAUSEA AND VOMITING THAT IS NOT CONTROLLED WITH YOUR NAUSEA MEDICATION  *UNUSUAL SHORTNESS OF BREATH  *UNUSUAL BRUISING OR BLEEDING  TENDERNESS IN MOUTH AND THROAT WITH OR WITHOUT PRESENCE OF ULCERS  *URINARY PROBLEMS  *BOWEL PROBLEMS  UNUSUAL RASH Items with * indicate a potential emergency and should be followed up as soon as possible.  Feel free to call the clinic should you have any questions or concerns. The clinic phone number is [phone].  Please show the [facility] at check-in to the Emergency Department and triage nurse.

## 2020-06-16 NOTE — Patient Instructions (Signed)

## 2020-06-16 NOTE — Progress Notes (Signed)
HEMATOLOGY/ONCOLOGY CLINIC NOTE  Date of Service: 06/16/2020  Patient Care Team: [name], MD as PCP - General (Family Medicine) [name], MD as Attending Physician (Physical Medicine and Rehabilitation) [name], MD as Consulting Physician (Orthopedic Surgery) [name], MD as Consulting Physician (Otolaryngology) [name], MD as Consulting Physician (Gastroenterology) [name], MD as Consulting Physician (General Surgery) [name], MD as Consulting Physician (Hematology and Oncology) [name], MD as Consulting Physician (Radiation Oncology) [name], NP as Nurse Practitioner (Hematology and Oncology) [name], [facility] as Pharmacist (Pharmacist)  CHIEF COMPLAINTS/PURPOSE OF CONSULTATION:  Multiple Myeloma not having achieved remission  HISTORY OF PRESENTING ILLNESS:  Tracy [name] is a wonderful 74 y.o. female who has been referred to [place] by Dr [name] for evaluation and management of Multiple Myeloma. Pt is accompanied today by her daughter in person and other daughter and granddaughter via phone. The pt reports that she is doing well overall.   When pt was first diagnosed with Breast Cancer it was localized in both of her breasts and was considered Stage 1. She then received chemotherapy. However, she declined chemotherapy a second time and chose radiation therapy after recurrence. Pt has been under the surveillance of Dr. [name] after declining antiestrogen therapy.   The pt reports that she was having difficulty breathing so she saw her Pulmonologist, who ordered the work up. Based on the results of the PET/CT pt was sent to Dr. [name] as they were concerned that her breast cancer had spread. Pt has been becoming increasingly anemic over the last year, despite using a PO Iron supplement. She has had a positive Fecal Occult tests, but has been having issues with hemorrhoids and hemorrhoidal bleeding for the  last 5-6 months. Pt denies any blood in her stools but notices blood on the tissue after she wipes. Pt has also been having some discomfort in her left hip and lower back. This pain is worsened when she sits down.   Pt has had a chronic cough that has been thought to either be Bronchitis or a result of acid reflux. Pt is planning to receive an Endoscopy to work this up further. She is currently taking Gabapentin for tingling/burning in her extremities. Pt has Type II Diabetes and was previously using Metformin, but discontinued due to concern for liver damage. She is not currently using any medications to control her Diabetes.   Pt had a heart attack in 2001. She initially though that she had a [employer], until the sensation began to travel up her body. She was given Nitroglycerin upon admittance to the hospital. Pt did not require any stents or other interventions. No cause for her heart attack was ever discovered. Pt has been seen by Neurology, Dr. Leta Baptist, who completed imaging on her neck and found a degeneratve disk. Pt has not had a nerve study conducted. Pt has no history of Shingles and has not received the Shingles vaccine. She has been fully vaccinated against he COVID19 virus.    Of note prior to the patient's visit today, pt has had PET/CT ([id number]) completed on 01/20/2020 with results revealing "1. Hypermetabolism corresponding to left acetabular and L5 lytic lesions, with differential considerations of metastatic disease or myeloma. 2. No evidence of hypermetabolic soft tissue primary, metastatic disease, or soft tissue myeloma. 3. Incidental findings, including: Aortic atherosclerosis (ICD10-I70.0) and emphysema (ICD10-J43.9). Hepatic steatosis."   Pt has had Bone Marrow Surgical Pathology ([id number]) completed on 01/17/2020 which revealed "  BONE MARROW, ASPIRATE, CLOT, CORE: -Hypercellular bone marrow for age with plasma cell neoplasm PERIPHERAL BLOOD:  -Normocytic-normochromic anemia"  Pt has had Left [place]chium Biopsy Surgical Pathology Report [id number]) completed on 01/17/2020 which revealed "Plasma cell neoplasm."  Most recent lab results (01/25/2020) of CBC is as follows: all values are WNL except for RBC at 2.44, Hgb at 7.5, HCT at 24.0, CO2 at 21, Glucose at 129, Creatinine at 1.38, Total Protein at 10.1, Albumin at 2.8, Total Bilirubin at <0.2, GFR Est Afr Am at 43. 01/25/2020 K/L light chains is as follows: Kappa free light chain at 22.7, Lamda free light chains at 17.7, K/L light chain ratio at 1.28 01/25/2020 MMP is as follows: all values are WNL except for IgG at 5220, Total Protein at 10.1, Alpha2 Glob at 1.2, Gamma Glob at 3.9, M Protein at 3.7, Total Globulin at 6.7, Albumin/Glob at 0.6. 01/25/2020 24-hr UPEP shows all values are WNL  On review of systems, pt reports lower back/left hip pain, unexpected weight loss, abdominal pain, loose stools and denies low appetite, constipation, rashes and any other symptoms.   On PMHx the pt reports Breast Cancer, Breast Lumpectomy, TIA, Restless leg, Myocardial infarction, HTN, HLD, Chronic Bronchitis.  INTERVAL HISTORY:  Tracy [name] is a wonderful 74 y.o. female who is here for evaluation and management of Multiple Myeloma. She is here for C5D15 Daratumumab. The patient's last visit with [place] was on 06/02/2020. The pt reports that she is doing well overall.  The pt reports that she has been tolerating Carflizomib + Daratumumab well. Pt notes that she is only drinking around 32 oz of water per day. She had some nausea over the weekend because she was experiencing altered taste. Things that are supposed to be sweet or sour often taste the opposite to her.  Lab results today (06/16/20) of CBC w/diff and CMP is as follows: all values are WNL except for RBC at 2.50, Hgb at 8.2, HCT at 24.7, CO2 at 21, Glucose at 113, Creatinine at 1.52, Calcium at 8.8, Albumin at 33, AST at 10, Total  Bilirubin at 0.2, GFR Est at 35.  On review of systems, pt reports dysgeusia and denies bloody/black stools, fatigue, low appetite and any other symptoms.    MEDICAL HISTORY:  Past Medical History:  Diagnosis Date  . Anxiety   . Arthritis   . Breast cancer ([place]) 06/13/15  . Cancer ([place]) 2000   breast cancer  . Chronic bronchitis ([place])   . Chronic bronchitis ([place])   . Hyperlipidemia   . Hypertension   . Myocardial infarction ([place]) 2001  . Personal history of radiation therapy   . Restless leg   . Stroke [facility]) 2004   TIA, no deficits    SURGICAL HISTORY: Past Surgical History:  Procedure Laterality Date  . ABDOMINAL HYSTERECTOMY  1985  . BREAST LUMPECTOMY Left 2000   radiation and chemo  . BREAST LUMPECTOMY Right 2016   radiation  . BREAST SURGERY  2001   lt breast lumpectomy  . IR IMAGING GUIDED PORT INSERTION  04/25/2020  . RADIOACTIVE SEED GUIDED PARTIAL MASTECTOMY WITH AXILLARY SENTINEL LYMPH NODE BIOPSY Right 07/07/2015   Procedure: RIGHT RADIOACTIVE SEED GUIDED PARTIAL MASTECTOMY WITH AXILLARY SENTINEL LYMPH NODE BIOPSY;  Surgeon: Autumn Messing III, MD;  Location: [facility];  Service: General;  Laterality: Right;  . SMALL INTESTINE SURGERY    . TUBAL LIGATION      SOCIAL HISTORY: Social History   Socioeconomic History  .  Marital status: Divorced    Spouse name: Not on file  . Number of children: 7  . Years of education: Not on file  . Highest education level: Not on file  Occupational History  . Occupation: retired  Tobacco Use  . Smoking status: Former Smoker    Packs/day: 1.00    Years: 20.00    Pack years: 20.00    Types: Cigarettes    Quit date: 07/30/2011    Years since quitting: 8.8  . Smokeless tobacco: Never Used  . Tobacco comment: Quit >4 years ago; 1 ppd for about 5/20 years (remaining was less)  Vaping Use  . Vaping Use: Former  Substance and Sexual Activity  . Alcohol use: No    Alcohol/week: 0.0 standard drinks  . Drug use:  No  . Sexual activity: Not Currently  Other Topics Concern  . Not on file  Social History Narrative   Lives alone.  Retired.  Education:  11th grade GED.  Children:  7 (one here).    Social Determinants of Health   Financial Resource Strain:   . Difficulty of Paying Living Expenses: Not on file  Food Insecurity: No Food Insecurity  . Worried About Charity fundraiser in the Last Year: Never true  . Ran Out of Food in the Last Year: Never true  Transportation Needs: No Transportation Needs  . Lack of Transportation (Medical): No  . Lack of Transportation (Non-Medical): No  Physical Activity:   . Days of Exercise per Week: Not on file  . Minutes of Exercise per Session: Not on file  Stress:   . Feeling of Stress : Not on file  Social Connections:   . Frequency of Communication with Friends and Family: Not on file  . Frequency of Social Gatherings with Friends and Family: Not on file  . Attends Religious Services: Not on file  . Active Member of Clubs or Organizations: Not on file  . Attends Archivist Meetings: Not on file  . Marital Status: Not on file  Intimate Partner Violence:   . Fear of Current or Ex-Partner: Not on file  . Emotionally Abused: Not on file  . Physically Abused: Not on file  . Sexually Abused: Not on file    FAMILY HISTORY: Family History  Problem Relation Age of Onset  . Diabetes Father   . Lung cancer Sister        dx. <50; former smoker  . Diabetes Brother   . Diabetes Paternal Aunt   . Stroke Maternal Grandmother   . Diabetes Paternal Grandmother   . Emphysema Mother 51       smoker  . Diabetes Brother   . Brain cancer Brother 65       unknown tumor type  . Cancer Daughter 47       neck cancer  . Other Daughter        hysterectomy for unspecified reason  . Breast cancer Cousin   . Cancer Cousin        unspecified type  . Breast cancer Other        triple negative breast cancer in her 44s  . Cancer Daughter   . Colon polyps  Neg Hx   . Esophageal cancer Neg Hx   . Gallbladder disease Neg Hx     ALLERGIES:  is allergic to bacitracin-neomycin-polymyxin  [neomycin-bacitracin zn-polymyx], nsaids, tape, ambien [zolpidem tartrate], amoxicillin, contrast media [iodinated diagnostic agents], latex, and prednisone.  MEDICATIONS:  Current Outpatient Medications  Medication Sig Dispense Refill  . Accu-Chek Softclix Lancets lancets Use as instructed to check sugars 1-2 times daily. 100 each 12  . acyclovir (ZOVIRAX) 400 MG tablet Take 1 tablet (400 mg total) by mouth 2 (two) times daily. 60 tablet 11  . albuterol (VENTOLIN HFA) 108 (90 Base) MCG/ACT inhaler Inhale 2 puffs into the lungs every 6 (six) hours as needed for wheezing or shortness of breath. 8 g 2  . aspirin 81 MG tablet Take 81 mg by mouth daily.    Marland Kitchen atorvastatin (LIPITOR) 10 MG tablet TAKE 1 TABLET BY MOUTH  DAILY 90 tablet 3  . dexamethasone (DECADRON) 4 MG tablet Take 3 tabs with breakfast the day after each treatment. 20 tablet 4  . ergocalciferol (VITAMIN D2) 1.25 MG (50000 UT) capsule Take 1 capsule (50,000 Units total) by mouth once a week. 12 capsule 2  . ferrous sulfate 325 (65 FE) MG tablet Take 1 tablet (325 mg total) by mouth daily with breakfast. 30 tablet 6  . fluticasone (FLOVENT HFA) 110 MCG/ACT inhaler Inhale 2 puffs into the lungs in the morning and at bedtime. 1 each 12  . gabapentin (NEURONTIN) 300 MG capsule TAKE 1 CAPSULE BY MOUTH 3  TIMES DAILY 270 capsule 3  . glucose blood (ACCU-CHEK GUIDE) test strip Use as instructed to check sugars 1-2 times daily. 100 each 12  . HYDROcodone-acetaminophen (NORCO) 5-325 MG tablet Take 1 tablet by mouth every 6 (six) hours as needed for moderate pain or severe pain. (Patient not taking: Reported on 05/16/2020) 60 tablet 0  . lidocaine-prilocaine (EMLA) cream Apply 1 application topically as needed. 30 g 0  . loratadine (CLARITIN) 10 MG tablet Take 10 mg by mouth daily.    Marland Kitchen LORazepam (ATIVAN) 0.5 MG  tablet Take 1 tablet (0.5 mg total) by mouth every 6 (six) hours as needed (Nausea or vomiting). 30 tablet 0  . losartan (COZAAR) 100 MG tablet TAKE 1 TABLET BY MOUTH  DAILY 90 tablet 3  . MELATONIN PO Take by mouth.     . metFORMIN (GLUCOPHAGE) 500 MG tablet TAKE 1 TABLET(500 MG) BY MOUTH TWICE DAILY WITH A MEAL 180 tablet 0  . mometasone (NASONEX) 50 MCG/ACT nasal spray Place 2 sprays into the nose daily. 17 g 12  . ondansetron (ZOFRAN) 8 MG tablet Take 1 tablet (8 mg total) by mouth 2 (two) times daily as needed (Nausea or vomiting). 30 tablet 1  . pantoprazole (PROTONIX) 40 MG tablet TAKE 1 TABLET(40 MG) BY MOUTH TWICE DAILY 90 tablet 1  . prochlorperazine (COMPAZINE) 10 MG tablet Take 1 tablet (10 mg total) by mouth every 6 (six) hours as needed (Nausea or vomiting). 30 tablet 1  . sertraline (ZOLOFT) 50 MG tablet TAKE 1 TABLET(50 MG) BY MOUTH DAILY 90 tablet 0  . tiZANidine (ZANAFLEX) 4 MG tablet TAKE 1 TABLET(4 MG) BY MOUTH AT BEDTIME 30 tablet 3  . traZODone (DESYREL) 100 MG tablet TAKE 1 TABLET BY MOUTH AT  BEDTIME 90 tablet 3   No current facility-administered medications for this visit.    REVIEW OF SYSTEMS:   A 10+ POINT REVIEW OF SYSTEMS WAS OBTAINED including neurology, dermatology, psychiatry, cardiac, respiratory, lymph, extremities, GI, GU, Musculoskeletal, constitutional, breasts, reproductive, HEENT.  All pertinent positives are noted in the HPI.  All others are negative.   PHYSICAL EXAMINATION: ECOG PERFORMANCE STATUS: 2 - Symptomatic, <50% confined to bed  . VS reviewed  Exam was given in a wheelchair   GENERAL:alert, in no  acute distress and comfortable SKIN: no acute rashes, no significant lesions EYES: conjunctiva are pink and non-injected, sclera anicteric OROPHARYNX: MMM, no exudates, no oropharyngeal erythema or ulceration NECK: supple, no JVD LYMPH:  no palpable lymphadenopathy in the cervical, axillary or inguinal regions LUNGS: clear to auscultation b/l  with normal respiratory effort HEART: regular rate & rhythm ABDOMEN:  normoactive bowel sounds , non tender, not distended. No palpable hepatosplenomegaly.  Extremity: no pedal edema PSYCH: alert & oriented x 3 with fluent speech NEURO: no focal motor/sensory deficits  LABORATORY DATA:  I have reviewed the data as listed  . CBC Latest Ref Rng & Units 06/02/2020 05/19/2020 05/05/2020  WBC 4.0 - 10.5 K/uL 6.9 5.7 6.6  Hemoglobin 12.0 - 15.0 g/dL 8.6(L) 8.3(L) 8.4(L)  Hematocrit 36 - 46 % 26.0(L) 25.8(L) 25.2(L)  Platelets 150 - 400 K/uL 239 292 276    . CMP Latest Ref Rng & Units 06/08/2020 06/02/2020 05/19/2020  Glucose 70 - 99 mg/dL 68(L) 118(H) 108(H)  BUN 8 - 23 mg/dL _0 Creatinine 0.44 - 1.00 mg/dL 1.31(H) 1.20(H) 1.33(H)  Sodium 135 - 145 mmol/L 139 141 141  Potassium 3.5 - 5.1 mmol/L 3.9 4.2 4.0  Chloride 98 - 111 mmol/L 106 109 111  CO2 22 - 32 mmol/L _1 Calcium 8.9 - 10.3 mg/dL 8.6(L) 8.7(L) 9.1  Total Protein 6.5 - 8.1 g/dL - 7.9 7.9  Total Bilirubin 0.3 - 1.2 mg/dL - <0.2(L) <0.2(L)  Alkaline Phos 38 - 126 U/L - 71 89  AST 15 - 41 U/L - 9(L) 10(L)  ALT 0 - 44 U/L - 8 7     05/16/2020 Upper Endoscopy:   05/16/2020 Colonoscopy:   01/25/2020 K/L light chains:    01/25/2020 MMP:            RADIOGRAPHIC STUDIES: I have personally reviewed the radiological images as listed and agreed with the findings in the report. No results found.  ASSESSMENT & PLAN:   74 yo with   1) Newly diagnosed IgG Kappa Multiple myeloma with bone lesions, anemia, renal insuff. M spike @ 3.7g/dl 1p deletion, polymorphic variant, 13q deletion 2) h/o Dm2 3) Diabetic Neuropathy 4) CKD - likely from DM2, but could have an element of myeloma kidney. 5) h/o TIA and AMI 6) Iron deficiency   PLAN: -Discussed pt labwork today, 06/16/20; anemia is stable, but still significant, WBC & PLT are nml, kidney numbers are elevated. -Discussed 06/02/2020 MMP show M  Protein at 1.9 g/dL -Will increase Carflizomib to 36 mg/m^2 for this cycle.  -The pt has no prohibitive toxicities from continuing [place] necessitating treatment change at this time. -Recommended that the pt continue to eat well and drink at least 48-64 oz of water each day. -Recommend pt continue to hold PO Iron.  -Continue Zometa   -Will see back in 3 weeks   FOLLOW UP: Please schedule cycle 6 and cycle 7 of daratumumab/Kyprolis.  Each cycle has day 1 8 and 15. Port flush and labs on day 1 8 and 15 each cycle Next MD visit on cycle 6-day 8 in 3 weeks.   The total time spent in the appt was 30 minutes and more than 50% was on counseling and direct patient cares.  All of the patient's questions were answered with apparent satisfaction. The patient knows to call the clinic with any problems, questions or concerns.   [name] MD [place] AAHIVMS [facility] Hematology/Oncology Physician [place]  [place]  (Office):       [phone] (Work cell):  [phone] (Fax):           [phone]  06/16/2020 7:52 AM   I, Yevette Edwards, am acting as a scribe for Dr. [name].   .I have reviewed the above documentation for accuracy and completeness, and I agree with the above. [name] MD

## 2020-06-20 DIAGNOSIS — H2513 Age-related nuclear cataract, bilateral: Secondary | ICD-10-CM | POA: Diagnosis not present

## 2020-06-20 DIAGNOSIS — H40013 Open angle with borderline findings, low risk, bilateral: Secondary | ICD-10-CM | POA: Diagnosis not present

## 2020-06-20 DIAGNOSIS — H40053 Ocular hypertension, bilateral: Secondary | ICD-10-CM | POA: Diagnosis not present

## 2020-06-20 DIAGNOSIS — Z83511 Family history of glaucoma: Secondary | ICD-10-CM | POA: Diagnosis not present

## 2020-06-20 DIAGNOSIS — E119 Type 2 diabetes mellitus without complications: Secondary | ICD-10-CM | POA: Diagnosis not present

## 2020-06-26 ENCOUNTER — Telehealth: Payer: Self-pay | Admitting: *Deleted

## 2020-06-26 ENCOUNTER — Telehealth: Payer: Self-pay

## 2020-06-26 NOTE — Telephone Encounter (Signed)
Pt called stating she is experiencing a sore throat, headache, and sinus drainage. Dr. Birdie Riddle does not have any available slots until next week. Pt asked to see if Dr. Birdie Riddle would be willing to work her in earlier this week due to going through chemo and needed to get better. Please advise.

## 2020-06-26 NOTE — Telephone Encounter (Signed)
Patient is wanting to know can she be worked in for sinus drainage, headache and sore throat? You have no availability until next week. Please advise!

## 2020-06-26 NOTE — Telephone Encounter (Signed)
Patient called  - over last 2 days, she has experienced sore throat with thick mucus. Upper chest feels heavy.  No cough unless she forces one to attempt to clear throat. No fever. Used Flovent for chest heaviness with some relief. Last time this happened, was given Z-pack by V.Tanner, PA for [facility]. Wants to know if she should be prescribed and antibiotic again? Dr. Irene Limbo informed.  Contacted patient with Dr. Grier Mitts response:Patient has chronic bronchitis and is on inhalers by her PCP. She needs to call her PCP for an evaluation to determine need for antibiotics since that will need a comprehensive longer term strategy with recurrent symptomatology.   Patient verbalized understanding and states she will contact PCP

## 2020-06-27 NOTE — Telephone Encounter (Signed)
Pt can be scheduled in a same day slot tomorrow (Wednesday).  Must be virtual b/c it's URI

## 2020-06-28 ENCOUNTER — Telehealth (INDEPENDENT_AMBULATORY_CARE_PROVIDER_SITE_OTHER): Payer: Medicare Other | Admitting: Family Medicine

## 2020-06-28 ENCOUNTER — Encounter: Payer: Self-pay | Admitting: Family Medicine

## 2020-06-28 DIAGNOSIS — R42 Dizziness and giddiness: Secondary | ICD-10-CM | POA: Diagnosis not present

## 2020-06-28 DIAGNOSIS — R55 Syncope and collapse: Secondary | ICD-10-CM

## 2020-06-28 NOTE — Progress Notes (Signed)
Virtual Visit via Video   I connected with patient on 06/28/20 at  2:30 PM EST by a video enabled telemedicine application and verified that I am speaking with the correct person using two identifiers.  Location patient: Home Location provider: [facility] Persons participating in the virtual visit: Patient, Provider, [place] (Sabrina M)  I discussed the limitations of evaluation and management by telemedicine and the availability of in person appointments. The patient expressed understanding and agreed to proceed.  Interactive audio and video telecommunications were attempted between this provider and patient, however failed, due to patient having technical difficulties OR patient did not have access to video capability.  We continued and completed visit with audio only.   Subjective:   HPI:   Weakness- primarily leg weakness, feels like 'legs are buckling'.  At first it lasted ~30 minutes 'before my strength came back up'.  ~1 month ago, had similar episode where her legs started shaking and she went down to the floor to avoid falling.  Strength came back ~45 minutes later.  2 nights ago had similar incident but she fell into the bathtub and it took ~3 hrs to recover.  Still feels somewhat weak today.  Pt reports arms and legs will be weak and she is unable to sit up- she goes limp.  Reports she feels she is still bleeding 'from somewhere' b/c 'I get so cold'.  Only occurs at night when she wakes to go the bathroom.  Sxs start on the 4th or 5th step out of bed.  Does not pass out.  ROS:   See pertinent positives and negatives per HPI.  Patient Active Problem List   Diagnosis Date Noted  . Counseling regarding advance care planning and goals of care 02/07/2020  . Multiple myeloma ([place]) 01/25/2020  . Dizziness 10/12/2019  . Post-nasal drainage 10/12/2019  . Allergic rhinitis 08/19/2018  . Type 2 diabetes mellitus with diabetic neuropathy, unspecified ([place]) 11/05/2017  .  Encounter for long-term use of muscle relaxants 09/24/2017  . CAD in native artery 09/24/2017  . OSA (obstructive sleep apnea) 09/24/2017  . Snorings 09/24/2017  . Morbid obesity ([place]) 06/02/2017  . Depression 06/02/2017  . Iron deficiency anemia 09/23/2016  . Anemia of chronic disease 09/28/2015  . Genetic testing 08/21/2015  . History of left breast cancer 07/13/2015  . History of right breast cancer 06/20/2015  . Hearing loss due to cerumen impaction 12/29/2014  . Allergy to adhesive tape 05/19/2014  . Hyperlipidemia 12/06/2013  . GERD (gastroesophageal reflux disease) 12/06/2013  . Cervical disc disease 11/11/2013  . Osteopenia 05/25/2013  . Allergic asthma 12/24/2012  . Insomnia 04/02/2012  . Physical exam, annual 04/02/2012  . HTN (hypertension) 02/03/2012  . Vertigo, benign positional 02/03/2012  . Left groin pain 02/03/2012  . Hip pain 10/10/2011  . TIA (transient ischemic attack) 07/24/2011  . Allergic reaction 07/05/2011  . Osteoarthrosis involving lower leg 10/19/2010  . Disorder of bone and cartilage 05/01/2010  . Generalized anxiety disorder 05/01/2010  . Vitamin D deficiency 02/20/2010  . Unspecified chronic bronchitis ([place]) 08/24/2009  . Other lymphedema 10/29/2007    Social History   Tobacco Use  . Smoking status: Former Smoker    Packs/day: 1.00    Years: 20.00    Pack years: 20.00    Types: Cigarettes    Quit date: 07/30/2011    Years since quitting: 8.9  . Smokeless tobacco: Never Used  . Tobacco comment: Quit >4 years ago; 1 ppd for about  5/20 years (remaining was less)  Substance Use Topics  . Alcohol use: No    Alcohol/week: 0.0 standard drinks    Current Outpatient Medications:  .  acyclovir (ZOVIRAX) 400 MG tablet, Take 1 tablet (400 mg total) by mouth 2 (two) times daily., Disp: 60 tablet, Rfl: 11 .  albuterol (VENTOLIN HFA) 108 (90 Base) MCG/ACT inhaler, Inhale 2 puffs into the lungs every 6 (six) hours as needed for wheezing or shortness of  breath., Disp: 8 g, Rfl: 2 .  aspirin 81 MG tablet, Take 81 mg by mouth daily., Disp: , Rfl:  .  atorvastatin (LIPITOR) 10 MG tablet, TAKE 1 TABLET BY MOUTH  DAILY, Disp: 90 tablet, Rfl: 3 .  dexamethasone (DECADRON) 4 MG tablet, Take 3 tabs with breakfast the day after each treatment., Disp: 20 tablet, Rfl: 4 .  ergocalciferol (VITAMIN D2) 1.25 MG (50000 UT) capsule, Take 1 capsule (50,000 Units total) by mouth once a week., Disp: 12 capsule, Rfl: 2 .  ferrous sulfate 325 (65 FE) MG tablet, Take 1 tablet (325 mg total) by mouth daily with breakfast., Disp: 30 tablet, Rfl: 6 .  fluticasone (FLOVENT HFA) 110 MCG/ACT inhaler, Inhale 2 puffs into the lungs in the morning and at bedtime., Disp: 1 each, Rfl: 12 .  gabapentin (NEURONTIN) 300 MG capsule, TAKE 1 CAPSULE BY MOUTH 3  TIMES DAILY, Disp: 270 capsule, Rfl: 3 .  glucose blood (ACCU-CHEK GUIDE) test strip, Use as instructed to check sugars 1-2 times daily., Disp: 100 each, Rfl: 12 .  lidocaine-prilocaine (EMLA) cream, Apply 1 application topically as needed., Disp: 30 g, Rfl: 0 .  loratadine (CLARITIN) 10 MG tablet, Take 10 mg by mouth daily., Disp: , Rfl:  .  LORazepam (ATIVAN) 0.5 MG tablet, Take 1 tablet (0.5 mg total) by mouth every 6 (six) hours as needed (Nausea or vomiting)., Disp: 30 tablet, Rfl: 0 .  losartan (COZAAR) 100 MG tablet, TAKE 1 TABLET BY MOUTH  DAILY, Disp: 90 tablet, Rfl: 3 .  MELATONIN PO, Take by mouth. , Disp: , Rfl:  .  metFORMIN (GLUCOPHAGE) 500 MG tablet, TAKE 1 TABLET(500 MG) BY MOUTH TWICE DAILY WITH A MEAL, Disp: 180 tablet, Rfl: 0 .  mometasone (NASONEX) 50 MCG/ACT nasal spray, Place 2 sprays into the nose daily., Disp: 17 g, Rfl: 12 .  ondansetron (ZOFRAN) 8 MG tablet, Take 1 tablet (8 mg total) by mouth 2 (two) times daily as needed (Nausea or vomiting)., Disp: 30 tablet, Rfl: 1 .  pantoprazole (PROTONIX) 40 MG tablet, TAKE 1 TABLET(40 MG) BY MOUTH TWICE DAILY, Disp: 90 tablet, Rfl: 1 .  prochlorperazine  (COMPAZINE) 10 MG tablet, Take 1 tablet (10 mg total) by mouth every 6 (six) hours as needed (Nausea or vomiting)., Disp: 30 tablet, Rfl: 1 .  tiZANidine (ZANAFLEX) 4 MG tablet, TAKE 1 TABLET(4 MG) BY MOUTH AT BEDTIME, Disp: 30 tablet, Rfl: 3 .  traZODone (DESYREL) 100 MG tablet, TAKE 1 TABLET BY MOUTH AT  BEDTIME, Disp: 90 tablet, Rfl: 3 .  Accu-Chek Softclix Lancets lancets, Use as instructed to check sugars 1-2 times daily., Disp: 100 each, Rfl: 12 .  HYDROcodone-acetaminophen (NORCO) 5-325 MG tablet, Take 1 tablet by mouth every 6 (six) hours as needed for moderate pain or severe pain. (Patient not taking: Reported on 05/16/2020), Disp: 60 tablet, Rfl: 0 .  sertraline (ZOLOFT) 50 MG tablet, TAKE 1 TABLET(50 MG) BY MOUTH DAILY (Patient not taking: Reported on 06/28/2020), Disp: 90 tablet, Rfl: 0  Allergies  Allergen Reactions  . Bacitracin-Neomycin-Polymyxin  [Neomycin-Bacitracin Zn-Polymyx] Swelling  . Nsaids Other (See Comments)    nosebleeds  . Tape Hives    Pt cannot tolerate bandaids, tape, or any other adhesives.   . Ambien [Zolpidem Tartrate]     Hives   . Amoxicillin     Rash   . Contrast Media [Iodinated Diagnostic Agents] Hives    Pt states hives with prior ct, was given benadryl to resolve  . Latex Swelling  . Prednisone     Pt tolerates Dexamethasone. Pt cannot recall what happens when she takes Prednisone    Objective:   There were no vitals taken for this visit. Pt is able to speak clearly, coherently without shortness of breath or increased work of breathing.  Thought process is linear.  Mood is appropriate.   Assessment and Plan:   Pre-syncope- new.  Pt's description of events- only occurring at night, only when getting up to use the bathroom, occurring on the 4th or 5th step, sliding down to avoid falling, fatigue afterwards- sound like a presyncopal event.  Her hgb 2 weeks ago was 8.2 and is likely contributing to her postural hypotension and near syncopal  events.  She reports drinking plenty of water- urine is clear.  She admits she is not eating as regularly as she should b/c of her nausea (and the fact that food doesn't taste the same).  Encouraged her to eat more regularly, change positions slowly, and I will message Dr Irene Limbo to determine if iron infusion, transfusion, or additional workup is needed.  Pt expressed understanding and is in agreement w/ plan.    [name], MD 06/28/2020  Time spent with the patient: 17 minutes, of which >50% was spent in obtaining information about symptoms, reviewing previous labs, evaluations, and treatments, counseling about condition (please see the discussed topics above), and developing a plan to further investigate it; had a number of questions which I addressed.

## 2020-06-28 NOTE — Progress Notes (Signed)
I connected with  Tracy [name] on 06/28/20 by a video enabled telemedicine application and verified that I am speaking with the correct person using two identifiers.   I discussed the limitations of evaluation and management by telemedicine. The patient expressed understanding and agreed to proceed.

## 2020-06-30 ENCOUNTER — Inpatient Hospital Stay: Payer: Medicare Other

## 2020-06-30 ENCOUNTER — Other Ambulatory Visit: Payer: Self-pay | Admitting: *Deleted

## 2020-06-30 ENCOUNTER — Other Ambulatory Visit: Payer: Self-pay

## 2020-06-30 ENCOUNTER — Inpatient Hospital Stay: Payer: Medicare Other | Attending: Hematology

## 2020-06-30 ENCOUNTER — Other Ambulatory Visit: Payer: Self-pay | Admitting: Hematology

## 2020-06-30 VITALS — BP 125/53 | HR 65 | Temp 98.3°F | Resp 18 | Ht 60.0 in | Wt 180.2 lb

## 2020-06-30 DIAGNOSIS — I252 Old myocardial infarction: Secondary | ICD-10-CM | POA: Insufficient documentation

## 2020-06-30 DIAGNOSIS — Z853 Personal history of malignant neoplasm of breast: Secondary | ICD-10-CM | POA: Insufficient documentation

## 2020-06-30 DIAGNOSIS — Z923 Personal history of irradiation: Secondary | ICD-10-CM | POA: Diagnosis not present

## 2020-06-30 DIAGNOSIS — Z8673 Personal history of transient ischemic attack (TIA), and cerebral infarction without residual deficits: Secondary | ICD-10-CM | POA: Insufficient documentation

## 2020-06-30 DIAGNOSIS — C9000 Multiple myeloma not having achieved remission: Secondary | ICD-10-CM

## 2020-06-30 DIAGNOSIS — Z87891 Personal history of nicotine dependence: Secondary | ICD-10-CM | POA: Insufficient documentation

## 2020-06-30 DIAGNOSIS — D509 Iron deficiency anemia, unspecified: Secondary | ICD-10-CM

## 2020-06-30 DIAGNOSIS — N189 Chronic kidney disease, unspecified: Secondary | ICD-10-CM | POA: Insufficient documentation

## 2020-06-30 DIAGNOSIS — D649 Anemia, unspecified: Secondary | ICD-10-CM

## 2020-06-30 DIAGNOSIS — E1140 Type 2 diabetes mellitus with diabetic neuropathy, unspecified: Secondary | ICD-10-CM | POA: Insufficient documentation

## 2020-06-30 DIAGNOSIS — Z7189 Other specified counseling: Secondary | ICD-10-CM

## 2020-06-30 DIAGNOSIS — E1122 Type 2 diabetes mellitus with diabetic chronic kidney disease: Secondary | ICD-10-CM | POA: Diagnosis not present

## 2020-06-30 DIAGNOSIS — Z5112 Encounter for antineoplastic immunotherapy: Secondary | ICD-10-CM | POA: Diagnosis not present

## 2020-06-30 LAB — CMP (CANCER CENTER ONLY)
ALT: 12 U/L (ref 0–44)
AST: 15 U/L (ref 15–41)
Albumin: 3.4 g/dL — ABNORMAL LOW (ref 3.5–5.0)
Alkaline Phosphatase: 72 U/L (ref 38–126)
Anion gap: 10 (ref 5–15)
BUN: 17 mg/dL (ref 8–23)
CO2: 23 mmol/L (ref 22–32)
Calcium: 8.6 mg/dL — ABNORMAL LOW (ref 8.9–10.3)
Chloride: 110 mmol/L (ref 98–111)
Creatinine: 1.41 mg/dL — ABNORMAL HIGH (ref 0.44–1.00)
GFR, Estimated: 39 mL/min — ABNORMAL LOW (ref >60)
Glucose, Bld: 126 mg/dL — ABNORMAL HIGH (ref 70–99)
Potassium: 3.7 mmol/L (ref 3.5–5.1)
Sodium: 143 mmol/L (ref 135–145)
Total Bilirubin: 0.3 mg/dL (ref 0.3–1.2)
Total Protein: 7.1 g/dL (ref 6.5–8.1)

## 2020-06-30 LAB — CBC WITH DIFFERENTIAL/PLATELET
Abs Immature Granulocytes: 0.01 10*3/uL (ref 0.00–0.07)
Basophils Absolute: 0 10*3/uL (ref 0.0–0.1)
Basophils Relative: 0 %
Eosinophils Absolute: 0.1 10*3/uL (ref 0.0–0.5)
Eosinophils Relative: 2 %
HCT: 21.2 % — ABNORMAL LOW (ref 36.0–46.0)
Hemoglobin: 7.2 g/dL — ABNORMAL LOW (ref 12.0–15.0)
Immature Granulocytes: 0 %
Lymphocytes Relative: 34 %
Lymphs Abs: 1.2 10*3/uL (ref 0.7–4.0)
MCH: 33 pg (ref 26.0–34.0)
MCHC: 34 g/dL (ref 30.0–36.0)
MCV: 97.2 fL (ref 80.0–100.0)
Monocytes Absolute: 0.3 10*3/uL (ref 0.1–1.0)
Monocytes Relative: 7 %
Neutro Abs: 2 10*3/uL (ref 1.7–7.7)
Neutrophils Relative %: 57 %
Platelets: 197 10*3/uL (ref 150–400)
RBC: 2.18 MIL/uL — ABNORMAL LOW (ref 3.87–5.11)
RDW: 13.5 % (ref 11.5–15.5)
WBC: 3.4 10*3/uL — ABNORMAL LOW (ref 4.0–10.5)
nRBC: 0 % (ref 0.0–0.2)

## 2020-06-30 LAB — SAMPLE TO BLOOD BANK

## 2020-06-30 LAB — PREPARE RBC (CROSSMATCH)

## 2020-06-30 MED ORDER — HEPARIN SOD (PORK) LOCK FLUSH 100 UNIT/ML IV SOLN
500.0000 [IU] | Freq: Once | INTRAVENOUS | Status: AC | PRN
  Administered 2020-06-30: 500 [IU]
  Filled 2020-06-30: qty 5

## 2020-06-30 MED ORDER — SODIUM CHLORIDE 0.9% FLUSH
10.0000 mL | INTRAVENOUS | Status: DC | PRN
  Administered 2020-06-30: 10 mL
  Filled 2020-06-30: qty 10

## 2020-06-30 MED ORDER — METHYLPREDNISOLONE SODIUM SUCC 125 MG IJ SOLR: 60.0000 mg | Freq: Once | INTRAMUSCULAR | Status: DC

## 2020-06-30 MED ORDER — HEPARIN SOD (PORK) LOCK FLUSH 100 UNIT/ML IV SOLN
500.0000 [IU] | INTRAVENOUS | Status: DC | PRN
  Filled 2020-06-30: qty 5

## 2020-06-30 MED ORDER — SODIUM CHLORIDE 0.9% FLUSH
10.0000 mL | INTRAVENOUS | Status: AC | PRN
  Administered 2020-06-30: 10 mL
  Filled 2020-06-30: qty 10

## 2020-06-30 NOTE — Patient Instructions (Signed)
Managing Low Blood Counts During Cancer Treatment Cancer treatments such as chemotherapy and radiation can sometimes cause a drop in the supply of blood cells in the body, including red blood cells, white blood cells, and platelets. These blood cells are produced in the body and are released into the blood to perform specific functions:  Red blood cells carry gases such as oxygen and carbon dioxide to and from your lungs.  White blood cells help protect you from infection.  Platelets help your body to form blood clots to prevent and control bleeding. When cancer treatments cause a drop in blood cell counts, your body may not have enough cells to keep up its normal functions. Symptoms or problems that may result will vary depending on which type of blood cells the treatment is affecting. If your blood counts are low, you can take steps to help manage any problems. How can low blood counts affect me? Low blood counts have various effects depending on the type of blood cells involved:  If you have a low number of red blood cells, you have a condition called anemia. This can cause symptoms such as: ? Feeling tired and weak. ? Feeling light-headed. ? Being short of breath.  If you have a low number of white blood cells, you may be at higher risk for infections.  If you have a low number of platelets, you may bleed more easily, or your body may have trouble stopping any bleeding. You may also have more bruising. How to manage symptoms or prevent problems from a low blood count If you have a low blood count, you can take steps to manage symptoms or prevent problems that may develop. The steps to take will depend on which type of blood cell is low. Low red blood cells Take these steps to help manage the symptoms of anemia:  Go for a walk or do some light exercise each day.  Take short naps during the day.  Eat foods that contain a lot of iron and protein. These include leafy vegetables, meat and  fish, beans, sweet potatoes, and dried fruit such as prunes, raisins, and apricots.  Ask for help with errands and with work that needs to be done around the house. It is important to save your energy.  Take vitamins or supplements--such as iron, vitamin B12, or folic acid--as told by your health care provider.  Practice relaxation techniques, such as yoga or meditation. Low white blood cells Take these steps to help prevent infections:  Wash your hands often with warm, soapy water.  Avoid crowds of people and any person who has the flu or a fever.  Take care when cleaning yourself after using the bathroom. Tell your health care provider if you have any rectal sores or bleeding.  Avoid dental work. Check your mouth each day for sores or signs of infection.  Do not share utensils.  Avoid contact with pet waste. Wash your hands after handling pets.  If you get a scrape or cut, clean it thoroughly right away.  Avoid fresh plants or dried flowers.  Do not swim or wade in lakes, ponds, rivers, water parks, or hot tubs.  Follow food safety guidelines. Cook meat thoroughly and wash all raw fruits and vegetables.  You may be instructed to wear a mask when around others to protect yourself.  Low platelets Take these steps to help prevent or control bleeding and bruising:  Use an electric razor for shaving instead of a blade.  Use   a soft toothbrush and be careful during oral care. Talk with your cancer care team about whether you should avoid flossing. If your mouth is bleeding, rinse it with ice water.  Avoid activities that could cause injury, such as contact sports.  Talk with your health care provider about using laxatives or stool softeners to avoid constipation.  Do not use medicines such as ibuprofen, aspirin, or naproxen unless your health care provider tells you to.  Limit alcohol use.  Monitor any bleeding closely. If you start bleeding, hold pressure on the area for 5  minutes to stop the bleeding. Bleeding that does not stop is considered an emergency. What treatments can help increase a low blood count? If needed, your health care provider may recommend treatment for a low blood count. Treatment will depend on the type of blood cell that is low and the severity of your condition. Treatment options may include:  Taking medicines to help stimulate the growth of blood cells. This is an option for treating a low red blood cell count. Your health care provider may also recommend that you take iron, folic acid, or vitamin B12 supplements.  Making dietary changes. Including more iron and protein in your diet can help stimulate the growth of red blood cells.  Adjusting your current medicines to help raise blood counts.  Making changes to your treatment plan.  Having a blood transfusion. This may be done if your blood count is very low. Contact a health care provider if:  You feel extremely tired and weak.  You have more bruising or bleeding.  You feel ill or you develop a cough.  You have swelling or redness.  You have mouth sores or a sore throat.  You have painful urination or you have blood in your urine or stool.  You are thinking of taking any new supplements or vitamins or making dietary changes. Get help right away if:  You are short of breath, have chest pain, or feel dizzy.  You have a fever or chills.  You have abdominal pain or diarrhea.  You have bleeding that will not stop. Summary  Cancer treatments such as chemotherapy and radiation can sometimes cause a drop in the supply of blood cells in the body, including red blood cells, white blood cells, and platelets.  If you have a low blood count, you can take steps to manage symptoms or prevent problems that may develop.  Depending on which type of blood cell is low, you may need to take steps to prevent infection, prevent bleeding, or manage symptoms that may develop.  If needed,  your health care provider may recommend treatment for a low blood count. This information is not intended to replace advice given to you by your health care provider. Make sure you discuss any questions you have with your health care provider. Document Revised: 08/01/2016 Document Reviewed: 03/09/2016 Elsevier Patient Education  2020 [employer]  

## 2020-06-30 NOTE — Progress Notes (Signed)
Patient will be receiving 2 units of blood tomorrow per MD. Type and Screen completed and blood orders in. Patient aware of change in treatment plan and agreeable to arriving tomorrow, Saturday 12/4 at 8:30 am for blood transfusion. Patient educated on leaving blue blood bracelet on for tomorrow's infusion.  Patient discharged from the [facility] in stable condition and with no signs of acute distress. Assisted to transportation via wheelchair with all belongings.

## 2020-07-01 ENCOUNTER — Inpatient Hospital Stay: Payer: Medicare Other

## 2020-07-01 DIAGNOSIS — Z923 Personal history of irradiation: Secondary | ICD-10-CM | POA: Diagnosis not present

## 2020-07-01 DIAGNOSIS — Z8673 Personal history of transient ischemic attack (TIA), and cerebral infarction without residual deficits: Secondary | ICD-10-CM | POA: Diagnosis not present

## 2020-07-01 DIAGNOSIS — E1140 Type 2 diabetes mellitus with diabetic neuropathy, unspecified: Secondary | ICD-10-CM | POA: Diagnosis not present

## 2020-07-01 DIAGNOSIS — E1122 Type 2 diabetes mellitus with diabetic chronic kidney disease: Secondary | ICD-10-CM | POA: Diagnosis not present

## 2020-07-01 DIAGNOSIS — D509 Iron deficiency anemia, unspecified: Secondary | ICD-10-CM

## 2020-07-01 DIAGNOSIS — N189 Chronic kidney disease, unspecified: Secondary | ICD-10-CM | POA: Diagnosis not present

## 2020-07-01 DIAGNOSIS — D649 Anemia, unspecified: Secondary | ICD-10-CM

## 2020-07-01 DIAGNOSIS — C9000 Multiple myeloma not having achieved remission: Secondary | ICD-10-CM

## 2020-07-01 DIAGNOSIS — Z5112 Encounter for antineoplastic immunotherapy: Secondary | ICD-10-CM | POA: Diagnosis not present

## 2020-07-01 DIAGNOSIS — I252 Old myocardial infarction: Secondary | ICD-10-CM | POA: Diagnosis not present

## 2020-07-01 DIAGNOSIS — Z853 Personal history of malignant neoplasm of breast: Secondary | ICD-10-CM | POA: Diagnosis not present

## 2020-07-01 DIAGNOSIS — Z87891 Personal history of nicotine dependence: Secondary | ICD-10-CM | POA: Diagnosis not present

## 2020-07-01 MED ORDER — ACETAMINOPHEN 325 MG PO TABS
650.0000 mg | ORAL_TABLET | Freq: Once | ORAL | Status: AC
  Administered 2020-07-01: 650 mg via ORAL

## 2020-07-01 MED ORDER — METHYLPREDNISOLONE SODIUM SUCC 125 MG IJ SOLR
INTRAMUSCULAR | Status: AC
  Filled 2020-07-01: qty 2

## 2020-07-01 MED ORDER — SODIUM CHLORIDE 0.9% IV SOLUTION
250.0000 mL | Freq: Once | INTRAVENOUS | Status: AC
  Administered 2020-07-01: 250 mL via INTRAVENOUS
  Filled 2020-07-01: qty 250

## 2020-07-01 MED ORDER — METHYLPREDNISOLONE SODIUM SUCC 125 MG IJ SOLR
60.0000 mg | Freq: Once | INTRAMUSCULAR | Status: AC
  Administered 2020-07-01: 60 mg via INTRAVENOUS

## 2020-07-01 NOTE — Patient Instructions (Signed)

## 2020-07-03 ENCOUNTER — Telehealth: Payer: Self-pay | Admitting: Family Medicine

## 2020-07-03 LAB — BPAM RBC
Blood Product Expiration Date: 202112302359
Blood Product Expiration Date: 202112302359
Blood Product Expiration Date: 202201112359
ISSUE DATE / TIME: 202112040909
ISSUE DATE / TIME: 202112041215
Unit Type and Rh: 6200
Unit Type and Rh: 6200
Unit Type and Rh: 6200

## 2020-07-03 LAB — KAPPA/LAMBDA LIGHT CHAINS
Kappa free light chain: 18.2 mg/L (ref 3.3–19.4)
Kappa, lambda light chain ratio: 1.5 (ref 0.26–1.65)
Lambda free light chains: 12.1 mg/L (ref 5.7–26.3)

## 2020-07-03 LAB — TYPE AND SCREEN
ABO/RH(D): A POS
Antibody Screen: POSITIVE
DAT, IgG: POSITIVE
PT AG Type: NEGATIVE
Unit division: 0
Unit division: 0
Unit division: 0

## 2020-07-03 LAB — MULTIPLE MYELOMA PANEL, SERUM
Albumin SerPl Elph-Mcnc: 3.3 g/dL (ref 2.9–4.4)
Albumin/Glob SerPl: 1 (ref 0.7–1.7)
Alpha 1: 0.2 g/dL (ref 0.0–0.4)
Alpha2 Glob SerPl Elph-Mcnc: 1.4 g/dL — ABNORMAL HIGH (ref 0.4–1.0)
B-Globulin SerPl Elph-Mcnc: 0.9 g/dL (ref 0.7–1.3)
Gamma Glob SerPl Elph-Mcnc: 1 g/dL (ref 0.4–1.8)
Globulin, Total: 3.6 g/dL (ref 2.2–3.9)
IgA: 64 mg/dL (ref 64–422)
IgG (Immunoglobin G), Serum: 1060 mg/dL (ref 586–1602)
IgM (Immunoglobulin M), Srm: 43 mg/dL (ref 26–217)
M Protein SerPl Elph-Mcnc: 0.8 g/dL — ABNORMAL HIGH
Total Protein ELP: 6.9 g/dL (ref 6.0–8.5)

## 2020-07-03 LAB — ERYTHROPOIETIN: Erythropoietin: 44.6 m[IU]/mL — ABNORMAL HIGH (ref 2.6–18.5)

## 2020-07-03 NOTE — Telephone Encounter (Signed)
Pt called in stating that she had a blood transfusion on Saturday she states she needs to know the next steps that she needs to take to find out where the bleeding is coming from.   Pt can be reached at the home #

## 2020-07-03 NOTE — Telephone Encounter (Signed)
The next step is to follow up w/ Dr Irene Limbo [facility]) as he is her oncologist/hematologist.  He will determine the next steps and can hopefully explain the anemia since her GI workup (both endoscopy and colonoscopy) was negative

## 2020-07-03 NOTE — Telephone Encounter (Signed)
At this point, I'm not sure that blood in her stool is the whole cause of her anemia.  Especially since she is undergoing treatment for multiple myeloma.  Dr Irene Limbo is a blood specialist which is why it would make the most sense to follow up with him- especially since the GI workup was unrevealing.  The endoscopy and colonoscopy did not reveal a bleeding source.  If GI wants to consider a capsule endoscopy to view the whole bowel, that would be their decision as this is not something I can approve.  I understand she is frustrated by her anemia and need for transfusion, but she called here twice today and was loud and rude to staff.  I am truly doing my best to help her, but typically once I refer to specialists, it is beyond my scope and out of my hands.  But I am happy to assist in any way possible.

## 2020-07-03 NOTE — Telephone Encounter (Signed)
When I spoke with patient to give her advice from Dr.[name], she began to yell when I was attempting to convey with  her about what Dr. Birdie Riddle advised her to do. Patient seemed extremely upset and took her fustration out all on me. I too understand how things can be fusrtating at times but I really could not perform my job due to her outburst.

## 2020-07-03 NOTE — Telephone Encounter (Signed)
Called and spoke with patient about concerns. Pt stated that Dr.[name] is the provider who initiated the work up from the beginning about the blood in stool. She states that she don't understand why she has to follow up with Dr. Irene Limbo for the next step and not Dr.[name]. Please advise

## 2020-07-04 NOTE — Telephone Encounter (Signed)
It is not clear to me that GI blood loss explains her problems however I think small bowel capsule endoscopy is a good next step.  We will reach out to her to explain and schedule.   Patty, can you please contact her and offer her small bowel capsule endoscopy for iron deficiency anemia, heme-positive stool.  thanks

## 2020-07-04 NOTE — Telephone Encounter (Signed)
Thank you, Dr Ardis Hughs!

## 2020-07-05 ENCOUNTER — Other Ambulatory Visit: Payer: Self-pay

## 2020-07-05 ENCOUNTER — Inpatient Hospital Stay (HOSPITAL_BASED_OUTPATIENT_CLINIC_OR_DEPARTMENT_OTHER): Payer: Medicare Other | Admitting: Hematology

## 2020-07-05 ENCOUNTER — Encounter: Payer: Self-pay | Admitting: General Practice

## 2020-07-05 ENCOUNTER — Inpatient Hospital Stay: Payer: Medicare Other

## 2020-07-05 VITALS — BP 150/59 | HR 80 | Temp 97.9°F | Resp 17 | Ht 60.0 in | Wt 181.0 lb

## 2020-07-05 DIAGNOSIS — Z8673 Personal history of transient ischemic attack (TIA), and cerebral infarction without residual deficits: Secondary | ICD-10-CM | POA: Diagnosis not present

## 2020-07-05 DIAGNOSIS — Z5112 Encounter for antineoplastic immunotherapy: Secondary | ICD-10-CM | POA: Diagnosis not present

## 2020-07-05 DIAGNOSIS — N189 Chronic kidney disease, unspecified: Secondary | ICD-10-CM | POA: Diagnosis not present

## 2020-07-05 DIAGNOSIS — C9000 Multiple myeloma not having achieved remission: Secondary | ICD-10-CM

## 2020-07-05 DIAGNOSIS — Z7189 Other specified counseling: Secondary | ICD-10-CM

## 2020-07-05 DIAGNOSIS — I252 Old myocardial infarction: Secondary | ICD-10-CM | POA: Diagnosis not present

## 2020-07-05 DIAGNOSIS — Z923 Personal history of irradiation: Secondary | ICD-10-CM | POA: Diagnosis not present

## 2020-07-05 DIAGNOSIS — D509 Iron deficiency anemia, unspecified: Secondary | ICD-10-CM | POA: Diagnosis not present

## 2020-07-05 DIAGNOSIS — Z853 Personal history of malignant neoplasm of breast: Secondary | ICD-10-CM | POA: Diagnosis not present

## 2020-07-05 DIAGNOSIS — E1140 Type 2 diabetes mellitus with diabetic neuropathy, unspecified: Secondary | ICD-10-CM | POA: Diagnosis not present

## 2020-07-05 DIAGNOSIS — E1122 Type 2 diabetes mellitus with diabetic chronic kidney disease: Secondary | ICD-10-CM | POA: Diagnosis not present

## 2020-07-05 DIAGNOSIS — Z87891 Personal history of nicotine dependence: Secondary | ICD-10-CM | POA: Diagnosis not present

## 2020-07-05 LAB — CBC WITH DIFFERENTIAL/PLATELET
Abs Immature Granulocytes: 0.01 10*3/uL (ref 0.00–0.07)
Basophils Absolute: 0 10*3/uL (ref 0.0–0.1)
Basophils Relative: 0 %
Eosinophils Absolute: 0.1 10*3/uL (ref 0.0–0.5)
Eosinophils Relative: 1 %
HCT: 30.6 % — ABNORMAL LOW (ref 36.0–46.0)
Hemoglobin: 10.3 g/dL — ABNORMAL LOW (ref 12.0–15.0)
Immature Granulocytes: 0 %
Lymphocytes Relative: 33 %
Lymphs Abs: 1.7 10*3/uL (ref 0.7–4.0)
MCH: 31.2 pg (ref 26.0–34.0)
MCHC: 33.7 g/dL (ref 30.0–36.0)
MCV: 92.7 fL (ref 80.0–100.0)
Monocytes Absolute: 0.4 10*3/uL (ref 0.1–1.0)
Monocytes Relative: 7 %
Neutro Abs: 2.9 10*3/uL (ref 1.7–7.7)
Neutrophils Relative %: 59 %
Platelets: 318 10*3/uL (ref 150–400)
RBC: 3.3 MIL/uL — ABNORMAL LOW (ref 3.87–5.11)
RDW: 14.7 % (ref 11.5–15.5)
WBC: 5 10*3/uL (ref 4.0–10.5)
nRBC: 0 % (ref 0.0–0.2)

## 2020-07-05 LAB — CMP (CANCER CENTER ONLY)
ALT: 11 U/L (ref 0–44)
AST: 11 U/L — ABNORMAL LOW (ref 15–41)
Albumin: 3.5 g/dL (ref 3.5–5.0)
Alkaline Phosphatase: 80 U/L (ref 38–126)
Anion gap: 10 (ref 5–15)
BUN: 12 mg/dL (ref 8–23)
CO2: 24 mmol/L (ref 22–32)
Calcium: 8.6 mg/dL — ABNORMAL LOW (ref 8.9–10.3)
Chloride: 109 mmol/L (ref 98–111)
Creatinine: 1.4 mg/dL — ABNORMAL HIGH (ref 0.44–1.00)
GFR, Estimated: 39 mL/min — ABNORMAL LOW (ref >60)
Glucose, Bld: 128 mg/dL — ABNORMAL HIGH (ref 70–99)
Potassium: 3.3 mmol/L — ABNORMAL LOW (ref 3.5–5.1)
Sodium: 143 mmol/L (ref 135–145)
Total Bilirubin: 0.4 mg/dL (ref 0.3–1.2)
Total Protein: 7.1 g/dL (ref 6.5–8.1)

## 2020-07-05 MED ORDER — DEXTROSE 5 % IV SOLN
36.0000 mg/m2 | Freq: Once | INTRAVENOUS | Status: AC
  Administered 2020-07-05: 70 mg via INTRAVENOUS
  Filled 2020-07-05: qty 5

## 2020-07-05 MED ORDER — DIPHENHYDRAMINE HCL 25 MG PO CAPS
ORAL_CAPSULE | ORAL | Status: AC
  Filled 2020-07-05: qty 2

## 2020-07-05 MED ORDER — ACETAMINOPHEN 325 MG PO TABS
ORAL_TABLET | ORAL | Status: AC
  Filled 2020-07-05: qty 2

## 2020-07-05 MED ORDER — DIPHENHYDRAMINE HCL 25 MG PO CAPS
50.0000 mg | ORAL_CAPSULE | Freq: Once | ORAL | Status: AC
  Administered 2020-07-05: 50 mg via ORAL

## 2020-07-05 MED ORDER — SODIUM CHLORIDE 0.9% FLUSH
10.0000 mL | INTRAVENOUS | Status: DC | PRN
  Administered 2020-07-05: 10 mL
  Filled 2020-07-05: qty 10

## 2020-07-05 MED ORDER — SODIUM CHLORIDE 0.9 % IV SOLN
Freq: Once | INTRAVENOUS | Status: DC
  Filled 2020-07-05: qty 250

## 2020-07-05 MED ORDER — FAMOTIDINE IN NACL 20-0.9 MG/50ML-% IV SOLN
20.0000 mg | Freq: Once | INTRAVENOUS | Status: AC
  Administered 2020-07-05: 20 mg via INTRAVENOUS

## 2020-07-05 MED ORDER — SODIUM CHLORIDE 0.9 % IV SOLN
Freq: Once | INTRAVENOUS | Status: AC
  Filled 2020-07-05: qty 250

## 2020-07-05 MED ORDER — SODIUM CHLORIDE 0.9 % IV SOLN
20.0000 mg | Freq: Once | INTRAVENOUS | Status: AC
  Administered 2020-07-05: 20 mg via INTRAVENOUS
  Filled 2020-07-05: qty 20

## 2020-07-05 MED ORDER — FAMOTIDINE IN NACL 20-0.9 MG/50ML-% IV SOLN
INTRAVENOUS | Status: AC
  Filled 2020-07-05: qty 50

## 2020-07-05 MED ORDER — DIPHENHYDRAMINE HCL 25 MG PO CAPS
ORAL_CAPSULE | ORAL | Status: AC
  Filled 2020-07-05: qty 1

## 2020-07-05 MED ORDER — HEPARIN SOD (PORK) LOCK FLUSH 100 UNIT/ML IV SOLN
500.0000 [IU] | Freq: Once | INTRAVENOUS | Status: AC | PRN
  Administered 2020-07-05: 500 [IU]
  Filled 2020-07-05: qty 5

## 2020-07-05 MED ORDER — ACETAMINOPHEN 325 MG PO TABS
650.0000 mg | ORAL_TABLET | Freq: Once | ORAL | Status: AC
  Administered 2020-07-05: 650 mg via ORAL

## 2020-07-05 NOTE — Progress Notes (Signed)
[facility] Psychosocial Distress Screening Clinical Social Work  Clinical Social Work was referred by distress screening protocol.  The patient scored a 3 on the Psychosocial Distress Thermometer which indicates mild distress. Clinical Social Worker did not contact patient as her distress is mild. No intervention indicated, please reconsult if needs arise.   ON[facility]N DISTRESS SCREENING 07/05/2020  Screening Type Change in Status  Distress experienced in past week (1-10) 3  Practical problem type   Emotional problem type (No Data)  Physical Problem type   Referral to clinical social work No    Clinical Social Worker follow up needed: No.  If yes, follow up plan:  Beverely Pace, [place], LCSW Clinical Social Worker Phone:  [phone]

## 2020-07-05 NOTE — Progress Notes (Signed)
Death of daughter 5 days ago in [place]. Another daughter very ill in [place]. Patient not able to travel due to current treatment regimen. Patient states she has support through church family

## 2020-07-05 NOTE — Patient Instructions (Addendum)
[facility] Discharge Instructions for Patients Receiving Chemotherapy  Today you received the following chemotherapy agents:  Kyprolis  To help prevent nausea and vomiting after your treatment, we encourage you to take your nausea medication as directed.   If you develop nausea and vomiting that is not controlled by your nausea medication, call the clinic.   BELOW ARE SYMPTOMS THAT SHOULD BE REPORTED IMMEDIATELY:  *FEVER GREATER THAN 100.5 F  *CHILLS WITH OR WITHOUT FEVER  NAUSEA AND VOMITING THAT IS NOT CONTROLLED WITH YOUR NAUSEA MEDICATION  *UNUSUAL SHORTNESS OF BREATH  *UNUSUAL BRUISING OR BLEEDING  TENDERNESS IN MOUTH AND THROAT WITH OR WITHOUT PRESENCE OF ULCERS  *URINARY PROBLEMS  *BOWEL PROBLEMS  UNUSUAL RASH Items with * indicate a potential emergency and should be followed up as soon as possible.  Feel free to call the clinic should you have any questions or concerns. The clinic phone number is [phone].  Please show the CHEMO ALERT CARD at check-in to the Emergency Department and triage nurse.   

## 2020-07-05 NOTE — Progress Notes (Signed)
HEMATOLOGY/ONCOLOGY CLINIC NOTE  Date of Service: 07/05/2020  Patient Care Team: [name], MD as PCP - General (Family Medicine) [name], MD as Attending Physician (Physical Medicine and Rehabilitation) [name], MD as Consulting Physician (Orthopedic Surgery) [name], MD as Consulting Physician (Otolaryngology) [name], MD as Consulting Physician (Gastroenterology) [name], MD as Consulting Physician (General Surgery) [name], MD as Consulting Physician (Hematology and Oncology) [name], MD as Consulting Physician (Radiation Oncology) [name], NP as Nurse Practitioner (Hematology and Oncology) [name], [facility] as Pharmacist (Pharmacist)  CHIEF COMPLAINTS/PURPOSE OF CONSULTATION:  Multiple Myeloma not having achieved remission  HISTORY OF PRESENTING ILLNESS:  Tracy [name] is a wonderful 74 y.o. female who has been referred to [place] by Dr [name] for evaluation and management of Multiple Myeloma. Pt is accompanied today by her daughter in person and other daughter and granddaughter via phone. The pt reports that she is doing well overall.   When pt was first diagnosed with Breast Cancer it was localized in both of her breasts and was considered Stage 1. She then received chemotherapy. However, she declined chemotherapy a second time and chose radiation therapy after recurrence. Pt has been under the surveillance of Dr. [name] after declining antiestrogen therapy.   The pt reports that she was having difficulty breathing so she saw her Pulmonologist, who ordered the work up. Based on the results of the PET/CT pt was sent to Dr. [name] as they were concerned that her breast cancer had spread. Pt has been becoming increasingly anemic over the last year, despite using a PO Iron supplement. She has had a positive Fecal Occult tests, but has been having issues with hemorrhoids and hemorrhoidal bleeding for the last  5-6 months. Pt denies any blood in her stools but notices blood on the tissue after she wipes. Pt has also been having some discomfort in her left hip and lower back. This pain is worsened when she sits down.   Pt has had a chronic cough that has been thought to either be Bronchitis or a result of acid reflux. Pt is planning to receive an Endoscopy to work this up further. She is currently taking Gabapentin for tingling/burning in her extremities. Pt has Type II Diabetes and was previously using Metformin, but discontinued due to concern for liver damage. She is not currently using any medications to control her Diabetes.   Pt had a heart attack in 2001. She initially though that she had a [employer], until the sensation began to travel up her body. She was given Nitroglycerin upon admittance to the hospital. Pt did not require any stents or other interventions. No cause for her heart attack was ever discovered. Pt has been seen by Neurology, Dr. Leta Baptist, who completed imaging on her neck and found a degeneratve disk. Pt has not had a nerve study conducted. Pt has no history of Shingles and has not received the Shingles vaccine. She has been fully vaccinated against he COVID19 virus.    Of note prior to the patient's visit today, pt has had PET/CT ([id number]) completed on 01/20/2020 with results revealing "1. Hypermetabolism corresponding to left acetabular and L5 lytic lesions, with differential considerations of metastatic disease or myeloma. 2. No evidence of hypermetabolic soft tissue primary, metastatic disease, or soft tissue myeloma. 3. Incidental findings, including: Aortic atherosclerosis (ICD10-I70.0) and emphysema (ICD10-J43.9). Hepatic steatosis."   Pt has had Bone Marrow Surgical Pathology ([id number]) completed on 01/17/2020 which revealed "  BONE MARROW, ASPIRATE, CLOT, CORE: -Hypercellular bone marrow for age with plasma cell neoplasm PERIPHERAL BLOOD: -Normocytic-normochromic  anemia"  Pt has had Left [place] Biopsy Surgical Pathology Report [phone]) completed on 01/17/2020 which revealed "Plasma cell neoplasm."  Most recent lab results (01/25/2020) of CBC is as follows: all values are WNL except for RBC at 2.44, Hgb at 7.5, HCT at 24.0, CO2 at 21, Glucose at 129, Creatinine at 1.38, Total Protein at 10.1, Albumin at 2.8, Total Bilirubin at <0.2, GFR Est Afr Am at 43. 01/25/2020 K/L light chains is as follows: Kappa free light chain at 22.7, Lamda free light chains at 17.7, K/L light chain ratio at 1.28 01/25/2020 MMP is as follows: all values are WNL except for IgG at 5220, Total Protein at 10.1, Alpha2 Glob at 1.2, Gamma Glob at 3.9, M Protein at 3.7, Total Globulin at 6.7, Albumin/Glob at 0.6. 01/25/2020 24-hr UPEP shows all values are WNL  On review of systems, pt reports lower back/left hip pain, unexpected weight loss, abdominal pain, loose stools and denies low appetite, constipation, rashes and any other symptoms.   On PMHx the pt reports Breast Cancer, Breast Lumpectomy, TIA, Restless leg, Myocardial infarction, HTN, HLD, Chronic Bronchitis.  INTERVAL HISTORY:  Tracy [name] is a wonderful 74 y.o. female who is here for evaluation and management of Multiple Myeloma. She is here for C6D8 Daratumumab. The patient's last visit with [place] was on 06/16/2020. The pt reports that she is doing well overall.  The pt reports that she lost one of her children unexpectedly and her other child is very sick. Pt is experiencing significant emotional distress. She reports resolved abdominal pain and improving bone pain. She notes that her stools are normally colored at this time.   Lab results today (07/05/20) of CBC w/diff and CMP is as follows: all values are WNL except for RBC at 3.30, Hgb at 10.3, HCT at 30.6, Potassium at 3.3, Glucose at 128, Creatinine at 1.40, Calcium at 8.6, AST at 11, GFR Est at 39. 07/05/2020 MMP is in progress  On review of systems, pt  reports improving bone pain and denies abdominal pain, black/bloody stools and any other symptoms.   MEDICAL HISTORY:  Past Medical History:  Diagnosis Date  . Anxiety   . Arthritis   . Breast cancer ([place]) 06/13/15  . Cancer ([place]) 2000   breast cancer  . Chronic bronchitis ([place])   . Chronic bronchitis ([place])   . Hyperlipidemia   . Hypertension   . Myocardial infarction ([place]) 2001  . Personal history of radiation therapy   . Restless leg   . Stroke [facility]) 2004   TIA, no deficits    SURGICAL HISTORY: Past Surgical History:  Procedure Laterality Date  . ABDOMINAL HYSTERECTOMY  1985  . BREAST LUMPECTOMY Left 2000   radiation and chemo  . BREAST LUMPECTOMY Right 2016   radiation  . BREAST SURGERY  2001   lt breast lumpectomy  . IR IMAGING GUIDED PORT INSERTION  04/25/2020  . RADIOACTIVE SEED GUIDED PARTIAL MASTECTOMY WITH AXILLARY SENTINEL LYMPH NODE BIOPSY Right 07/07/2015   Procedure: RIGHT RADIOACTIVE SEED GUIDED PARTIAL MASTECTOMY WITH AXILLARY SENTINEL LYMPH NODE BIOPSY;  Surgeon: Autumn Messing III, MD;  Location: [facility];  Service: General;  Laterality: Right;  . SMALL INTESTINE SURGERY    . TUBAL LIGATION      SOCIAL HISTORY: Social History   Socioeconomic History  . Marital status: Divorced    Spouse name: Not on file  .  Number of children: 7  . Years of education: Not on file  . Highest education level: Not on file  Occupational History  . Occupation: retired  Tobacco Use  . Smoking status: Former Smoker    Packs/day: 1.00    Years: 20.00    Pack years: 20.00    Types: Cigarettes    Quit date: 07/30/2011    Years since quitting: 8.9  . Smokeless tobacco: Never Used  . Tobacco comment: Quit >4 years ago; 1 ppd for about 5/20 years (remaining was less)  Vaping Use  . Vaping Use: Former  Substance and Sexual Activity  . Alcohol use: No    Alcohol/week: 0.0 standard drinks  . Drug use: No  . Sexual activity: Not Currently  Other Topics Concern   . Not on file  Social History Narrative   Lives alone.  Retired.  Education:  11th grade GED.  Children:  7 (one here).    Social Determinants of Health   Financial Resource Strain:   . Difficulty of Paying Living Expenses: Not on file  Food Insecurity: No Food Insecurity  . Worried About Charity fundraiser in the Last Year: Never true  . Ran Out of Food in the Last Year: Never true  Transportation Needs: No Transportation Needs  . Lack of Transportation (Medical): No  . Lack of Transportation (Non-Medical): No  Physical Activity:   . Days of Exercise per Week: Not on file  . Minutes of Exercise per Session: Not on file  Stress:   . Feeling of Stress : Not on file  Social Connections:   . Frequency of Communication with Friends and Family: Not on file  . Frequency of Social Gatherings with Friends and Family: Not on file  . Attends Religious Services: Not on file  . Active Member of Clubs or Organizations: Not on file  . Attends Archivist Meetings: Not on file  . Marital Status: Not on file  Intimate Partner Violence:   . Fear of Current or Ex-Partner: Not on file  . Emotionally Abused: Not on file  . Physically Abused: Not on file  . Sexually Abused: Not on file    FAMILY HISTORY: Family History  Problem Relation Age of Onset  . Diabetes Father   . Lung cancer Sister        dx. <50; former smoker  . Diabetes Brother   . Diabetes Paternal Aunt   . Stroke Maternal Grandmother   . Diabetes Paternal Grandmother   . Emphysema Mother 62       smoker  . Diabetes Brother   . Brain cancer Brother 72       unknown tumor type  . Cancer Daughter 28       neck cancer  . Other Daughter        hysterectomy for unspecified reason  . Breast cancer Cousin   . Cancer Cousin        unspecified type  . Breast cancer Other        triple negative breast cancer in her 53s  . Cancer Daughter   . Colon polyps Neg Hx   . Esophageal cancer Neg Hx   . Gallbladder disease  Neg Hx     ALLERGIES:  is allergic to bacitracin-neomycin-polymyxin  [neomycin-bacitracin zn-polymyx], nsaids, tape, ambien [zolpidem tartrate], amoxicillin, contrast media [iodinated diagnostic agents], latex, and prednisone.  MEDICATIONS:  Current Outpatient Medications  Medication Sig Dispense Refill  . Accu-Chek Softclix Lancets lancets Use as  instructed to check sugars 1-2 times daily. 100 each 12  . acyclovir (ZOVIRAX) 400 MG tablet Take 1 tablet (400 mg total) by mouth 2 (two) times daily. 60 tablet 11  . albuterol (VENTOLIN HFA) 108 (90 Base) MCG/ACT inhaler Inhale 2 puffs into the lungs every 6 (six) hours as needed for wheezing or shortness of breath. 8 g 2  . aspirin 81 MG tablet Take 81 mg by mouth daily.    Marland Kitchen atorvastatin (LIPITOR) 10 MG tablet TAKE 1 TABLET BY MOUTH  DAILY 90 tablet 3  . dexamethasone (DECADRON) 4 MG tablet Take 3 tabs with breakfast the day after each treatment. 20 tablet 4  . ergocalciferol (VITAMIN D2) 1.25 MG (50000 UT) capsule Take 1 capsule (50,000 Units total) by mouth once a week. 12 capsule 2  . ferrous sulfate 325 (65 FE) MG tablet Take 1 tablet (325 mg total) by mouth daily with breakfast. 30 tablet 6  . fluticasone (FLOVENT HFA) 110 MCG/ACT inhaler Inhale 2 puffs into the lungs in the morning and at bedtime. 1 each 12  . gabapentin (NEURONTIN) 300 MG capsule TAKE 1 CAPSULE BY MOUTH 3  TIMES DAILY 270 capsule 3  . glucose blood (ACCU-CHEK GUIDE) test strip Use as instructed to check sugars 1-2 times daily. 100 each 12  . HYDROcodone-acetaminophen (NORCO) 5-325 MG tablet Take 1 tablet by mouth every 6 (six) hours as needed for moderate pain or severe pain. (Patient not taking: Reported on 05/16/2020) 60 tablet 0  . lidocaine-prilocaine (EMLA) cream Apply 1 application topically as needed. 30 g 0  . loratadine (CLARITIN) 10 MG tablet Take 10 mg by mouth daily.    Marland Kitchen LORazepam (ATIVAN) 0.5 MG tablet Take 1 tablet (0.5 mg total) by mouth every 6 (six) hours  as needed (Nausea or vomiting). 30 tablet 0  . losartan (COZAAR) 100 MG tablet TAKE 1 TABLET BY MOUTH  DAILY 90 tablet 3  . MELATONIN PO Take by mouth.     . metFORMIN (GLUCOPHAGE) 500 MG tablet TAKE 1 TABLET(500 MG) BY MOUTH TWICE DAILY WITH A MEAL 180 tablet 0  . mometasone (NASONEX) 50 MCG/ACT nasal spray Place 2 sprays into the nose daily. 17 g 12  . ondansetron (ZOFRAN) 8 MG tablet Take 1 tablet (8 mg total) by mouth 2 (two) times daily as needed (Nausea or vomiting). 30 tablet 1  . pantoprazole (PROTONIX) 40 MG tablet TAKE 1 TABLET(40 MG) BY MOUTH TWICE DAILY 90 tablet 1  . prochlorperazine (COMPAZINE) 10 MG tablet Take 1 tablet (10 mg total) by mouth every 6 (six) hours as needed (Nausea or vomiting). 30 tablet 1  . sertraline (ZOLOFT) 50 MG tablet TAKE 1 TABLET(50 MG) BY MOUTH DAILY (Patient not taking: Reported on 06/28/2020) 90 tablet 0  . tiZANidine (ZANAFLEX) 4 MG tablet TAKE 1 TABLET(4 MG) BY MOUTH AT BEDTIME 30 tablet 3  . traZODone (DESYREL) 100 MG tablet TAKE 1 TABLET BY MOUTH AT  BEDTIME 90 tablet 3   No current facility-administered medications for this visit.    REVIEW OF SYSTEMS:   A 10+ POINT REVIEW OF SYSTEMS WAS OBTAINED including neurology, dermatology, psychiatry, cardiac, respiratory, lymph, extremities, GI, GU, Musculoskeletal, constitutional, breasts, reproductive, HEENT.  All pertinent positives are noted in the HPI.  All others are negative.   PHYSICAL EXAMINATION: ECOG PERFORMANCE STATUS: 2 - Symptomatic, <50% confined to bed  . VS reviewed  GENERAL:alert, in no acute distress and comfortable SKIN: no acute rashes, no significant lesions EYES: conjunctiva are  pink and non-injected, sclera anicteric OROPHARYNX: MMM, no exudates, no oropharyngeal erythema or ulceration NECK: supple, no JVD LYMPH:  no palpable lymphadenopathy in the cervical, axillary or inguinal regions LUNGS: clear to auscultation b/l with normal respiratory effort HEART: regular rate &  rhythm ABDOMEN:  normoactive bowel sounds , non tender, not distended. No palpable hepatosplenomegaly.  Extremity: no pedal edema PSYCH: alert & oriented x 3 with fluent speech NEURO: no focal motor/sensory deficits  LABORATORY DATA:  I have reviewed the data as listed  . CBC Latest Ref Rng & Units 06/30/2020 06/16/2020 06/02/2020  WBC 4.0 - 10.5 K/uL 3.4(L) 6.1 6.9  Hemoglobin 12.0 - 15.0 g/dL 7.2(L) 8.2(L) 8.6(L)  Hematocrit 36 - 46 % 21.2(L) 24.7(L) 26.0(L)  Platelets 150 - 400 K/uL 197 262 239    . CMP Latest Ref Rng & Units 06/30/2020 06/16/2020 06/08/2020  Glucose 70 - 99 mg/dL 126(H) 113(H) 68(L)  BUN 8 - 23 mg/dL _0 Creatinine 0.44 - 1.00 mg/dL 1.41(H) 1.52(H) 1.31(H)  Sodium 135 - 145 mmol/L 143 140 139  Potassium 3.5 - 5.1 mmol/L 3.7 4.2 3.9  Chloride 98 - 111 mmol/L 110 110 106  CO2 22 - 32 mmol/L 23 21(L) 23  Calcium 8.9 - 10.3 mg/dL 8.6(L) 8.8(L) 8.6(L)  Total Protein 6.5 - 8.1 g/dL 7.1 7.5 -  Total Bilirubin 0.3 - 1.2 mg/dL 0.3 0.2(L) -  Alkaline Phos 38 - 126 U/L 72 76 -  AST 15 - 41 U/L 15 10(L) -  ALT 0 - 44 U/L 12 7 -     05/16/2020 Upper Endoscopy:   05/16/2020 Colonoscopy:   01/25/2020 K/L light chains:    01/25/2020 MMP:            RADIOGRAPHIC STUDIES: I have personally reviewed the radiological images as listed and agreed with the findings in the report. No results found.  ASSESSMENT & PLAN:   74 yo with   1) Newly diagnosed IgG Kappa Multiple myeloma with bone lesions, anemia, renal insuff. M spike @ 3.7g/dl 1p deletion, polymorphic variant, 13q deletion 2) h/o Dm2 3) Diabetic Neuropathy 4) CKD - likely from DM2, but could have an element of myeloma kidney. 5) h/o TIA and AMI 6) Iron deficiency   PLAN: -Discussed pt labwork today, 07/05/20; Hgb looks good, WBC & PLT are nml, mild hypokalemia, other chemistries are stable. -Discussed 07/05/20 MMP is in progress, 06/30/2020 M Protein continues to improve at 0.8 g/dL,  K/Llight chain ratio remains WNL. -The pt has no prohibitive toxicities from continuing C6D8 Daratumumab + Carfilzomib necessitating treatment change at this time. -Will continue Carflizomib at 36 mg/m^2 for C6. Will increase to 56 mg/m^2 with next cycle if blood counts remain stable.  -Advised pt that fatigue is not likley caused by myeloma as it is responding well to treatment.  -Advised pt that there can be some fatigue from treatment, as well as emotional distress.  -Discussed pt's recent loss and gave emotional counsel. -Continue Zometa  -Will see back in 4 weeks with labs    FOLLOW UP: Today will be cycle 6-day 8 of treatment. Follow-up with port flush labs and cycle 6-day 15 of treatment as scheduled on 07/14/2020. Please schedule cycle 7 of carfilzomib/daratumumab treatment as per orders. Port flush and labs on day 1 8 and 15 each cycle. Next MD visit on cycle 7-day 8.   The total time spent in the appt was 30 minutes and more than 50% was on counseling and direct patient  cares.  All of the patient's questions were answered with apparent satisfaction. The patient knows to call the clinic with any problems, questions or concerns.   [name] MD [place] AAHIVMS [facility] Hematology/Oncology [facility]  (Office):       [phone] (Work cell):  [phone] (Fax):           [phone]  07/05/2020 8:04 AM   I, Yevette Edwards, am acting as a scribe for Dr. [name].   .I have reviewed the above documentation for accuracy and completeness, and I agree with the above. [name] MD

## 2020-07-06 ENCOUNTER — Telehealth: Payer: Self-pay | Admitting: Family Medicine

## 2020-07-06 ENCOUNTER — Other Ambulatory Visit: Payer: Self-pay | Admitting: Family Medicine

## 2020-07-06 NOTE — Telephone Encounter (Signed)
Can we resend pantoprazole in to the [facility] mail order.

## 2020-07-07 ENCOUNTER — Ambulatory Visit: Payer: Medicare Other | Admitting: Hematology

## 2020-07-07 ENCOUNTER — Telehealth: Payer: Self-pay

## 2020-07-07 ENCOUNTER — Ambulatory Visit: Payer: Medicare Other

## 2020-07-07 ENCOUNTER — Other Ambulatory Visit: Payer: Medicare Other

## 2020-07-07 LAB — MULTIPLE MYELOMA PANEL, SERUM
Albumin SerPl Elph-Mcnc: 3.5 g/dL (ref 2.9–4.4)
Albumin/Glob SerPl: 1.1 (ref 0.7–1.7)
Alpha 1: 0.2 g/dL (ref 0.0–0.4)
Alpha2 Glob SerPl Elph-Mcnc: 1.3 g/dL — ABNORMAL HIGH (ref 0.4–1.0)
B-Globulin SerPl Elph-Mcnc: 0.8 g/dL (ref 0.7–1.3)
Gamma Glob SerPl Elph-Mcnc: 0.9 g/dL (ref 0.4–1.8)
Globulin, Total: 3.2 g/dL (ref 2.2–3.9)
IgA: 67 mg/dL (ref 64–422)
IgG (Immunoglobin G), Serum: 999 mg/dL (ref 586–1602)
IgM (Immunoglobulin M), Srm: 43 mg/dL (ref 26–217)
M Protein SerPl Elph-Mcnc: 0.7 g/dL — ABNORMAL HIGH
Total Protein ELP: 6.7 g/dL (ref 6.0–8.5)

## 2020-07-07 NOTE — Telephone Encounter (Signed)
Called pt and left a vm that her protonix  Has been refilled and ready for pick up.

## 2020-07-08 ENCOUNTER — Other Ambulatory Visit: Payer: Self-pay | Admitting: Family Medicine

## 2020-07-10 NOTE — Telephone Encounter (Signed)
Left message on machine to call back  

## 2020-07-11 NOTE — Telephone Encounter (Signed)
Left message on machine to call back  

## 2020-07-12 ENCOUNTER — Telehealth: Payer: Self-pay | Admitting: Gastroenterology

## 2020-07-12 DIAGNOSIS — D509 Iron deficiency anemia, unspecified: Secondary | ICD-10-CM

## 2020-07-12 NOTE — Telephone Encounter (Signed)
Line rings no voice mail   I have tried to reach the pt on several occasions with no return call.  I will mail a letter to the pt and have her call our office.

## 2020-07-12 NOTE — Telephone Encounter (Signed)
The pt has returned my call and appt made for Capsule on 12/23. She has been instructed and mailed a copy to her and sent to My Chart.  Amb referral entered and capsule form completed and put in Amy Esterwood's office.

## 2020-07-12 NOTE — Telephone Encounter (Signed)
I have mailed a letter to the pt to return a call to our office to make appt.

## 2020-07-14 ENCOUNTER — Inpatient Hospital Stay: Payer: Medicare Other

## 2020-07-14 ENCOUNTER — Other Ambulatory Visit: Payer: Self-pay

## 2020-07-14 ENCOUNTER — Inpatient Hospital Stay (HOSPITAL_BASED_OUTPATIENT_CLINIC_OR_DEPARTMENT_OTHER): Payer: Medicare Other | Admitting: Medical

## 2020-07-14 ENCOUNTER — Other Ambulatory Visit: Payer: Self-pay | Admitting: Medical

## 2020-07-14 ENCOUNTER — Other Ambulatory Visit: Payer: Self-pay | Admitting: Hematology

## 2020-07-14 VITALS — BP 156/73 | HR 85 | Temp 98.8°F | Resp 18

## 2020-07-14 DIAGNOSIS — Z8673 Personal history of transient ischemic attack (TIA), and cerebral infarction without residual deficits: Secondary | ICD-10-CM | POA: Diagnosis not present

## 2020-07-14 DIAGNOSIS — Z5112 Encounter for antineoplastic immunotherapy: Secondary | ICD-10-CM | POA: Diagnosis not present

## 2020-07-14 DIAGNOSIS — Z923 Personal history of irradiation: Secondary | ICD-10-CM | POA: Diagnosis not present

## 2020-07-14 DIAGNOSIS — C9000 Multiple myeloma not having achieved remission: Secondary | ICD-10-CM

## 2020-07-14 DIAGNOSIS — Z7189 Other specified counseling: Secondary | ICD-10-CM

## 2020-07-14 DIAGNOSIS — Z95828 Presence of other vascular implants and grafts: Secondary | ICD-10-CM

## 2020-07-14 DIAGNOSIS — I252 Old myocardial infarction: Secondary | ICD-10-CM | POA: Diagnosis not present

## 2020-07-14 DIAGNOSIS — J42 Unspecified chronic bronchitis: Secondary | ICD-10-CM

## 2020-07-14 DIAGNOSIS — E876 Hypokalemia: Secondary | ICD-10-CM

## 2020-07-14 DIAGNOSIS — E1140 Type 2 diabetes mellitus with diabetic neuropathy, unspecified: Secondary | ICD-10-CM | POA: Diagnosis not present

## 2020-07-14 DIAGNOSIS — N189 Chronic kidney disease, unspecified: Secondary | ICD-10-CM | POA: Diagnosis not present

## 2020-07-14 DIAGNOSIS — E1122 Type 2 diabetes mellitus with diabetic chronic kidney disease: Secondary | ICD-10-CM | POA: Diagnosis not present

## 2020-07-14 DIAGNOSIS — D509 Iron deficiency anemia, unspecified: Secondary | ICD-10-CM

## 2020-07-14 DIAGNOSIS — Z853 Personal history of malignant neoplasm of breast: Secondary | ICD-10-CM | POA: Diagnosis not present

## 2020-07-14 DIAGNOSIS — Z87891 Personal history of nicotine dependence: Secondary | ICD-10-CM | POA: Diagnosis not present

## 2020-07-14 LAB — CBC WITH DIFFERENTIAL/PLATELET
Abs Immature Granulocytes: 0.01 10*3/uL (ref 0.00–0.07)
Basophils Absolute: 0 10*3/uL (ref 0.0–0.1)
Basophils Relative: 0 %
Eosinophils Absolute: 0 10*3/uL (ref 0.0–0.5)
Eosinophils Relative: 1 %
HCT: 27.3 % — ABNORMAL LOW (ref 36.0–46.0)
Hemoglobin: 9 g/dL — ABNORMAL LOW (ref 12.0–15.0)
Immature Granulocytes: 0 %
Lymphocytes Relative: 25 %
Lymphs Abs: 1.4 10*3/uL (ref 0.7–4.0)
MCH: 31 pg (ref 26.0–34.0)
MCHC: 33 g/dL (ref 30.0–36.0)
MCV: 94.1 fL (ref 80.0–100.0)
Monocytes Absolute: 0.4 10*3/uL (ref 0.1–1.0)
Monocytes Relative: 7 %
Neutro Abs: 3.8 10*3/uL (ref 1.7–7.7)
Neutrophils Relative %: 67 %
Platelets: 214 10*3/uL (ref 150–400)
RBC: 2.9 MIL/uL — ABNORMAL LOW (ref 3.87–5.11)
RDW: 14.3 % (ref 11.5–15.5)
WBC: 5.7 10*3/uL (ref 4.0–10.5)
nRBC: 0 % (ref 0.0–0.2)

## 2020-07-14 LAB — CMP (CANCER CENTER ONLY)
ALT: 9 U/L (ref 0–44)
AST: 10 U/L — ABNORMAL LOW (ref 15–41)
Albumin: 3.1 g/dL — ABNORMAL LOW (ref 3.5–5.0)
Alkaline Phosphatase: 76 U/L (ref 38–126)
Anion gap: 10 (ref 5–15)
BUN: 9 mg/dL (ref 8–23)
CO2: 22 mmol/L (ref 22–32)
Calcium: 8.4 mg/dL — ABNORMAL LOW (ref 8.9–10.3)
Chloride: 110 mmol/L (ref 98–111)
Creatinine: 1.12 mg/dL — ABNORMAL HIGH (ref 0.44–1.00)
GFR, Estimated: 51 mL/min — ABNORMAL LOW (ref >60)
Glucose, Bld: 183 mg/dL — ABNORMAL HIGH (ref 70–99)
Potassium: 2.9 mmol/L — ABNORMAL LOW (ref 3.5–5.1)
Sodium: 142 mmol/L (ref 135–145)
Total Bilirubin: 0.4 mg/dL (ref 0.3–1.2)
Total Protein: 6.4 g/dL — ABNORMAL LOW (ref 6.5–8.1)

## 2020-07-14 MED ORDER — POTASSIUM CHLORIDE CRYS ER 20 MEQ PO TBCR
EXTENDED_RELEASE_TABLET | ORAL | Status: AC
  Filled 2020-07-14: qty 2

## 2020-07-14 MED ORDER — SODIUM CHLORIDE 0.9 % IV SOLN
Freq: Once | INTRAVENOUS | Status: DC
  Filled 2020-07-14: qty 250

## 2020-07-14 MED ORDER — DARATUMUMAB CHEMO INJECTION 400 MG/20ML
16.0000 mg/kg | Freq: Once | INTRAVENOUS | Status: AC
  Administered 2020-07-14: 1400 mg via INTRAVENOUS
  Filled 2020-07-14: qty 60

## 2020-07-14 MED ORDER — SODIUM CHLORIDE 0.9% FLUSH
10.0000 mL | INTRAVENOUS | Status: DC | PRN
  Administered 2020-07-14: 10 mL
  Filled 2020-07-14: qty 10

## 2020-07-14 MED ORDER — POTASSIUM CHLORIDE CRYS ER 20 MEQ PO TBCR: 20.0000 meq | EXTENDED_RELEASE_TABLET | Freq: Two times a day (BID) | ORAL | 1 refills | Status: DC

## 2020-07-14 MED ORDER — POTASSIUM CHLORIDE 20 MEQ PO PACK
40.0000 meq | PACK | ORAL | Status: DC
  Filled 2020-07-14: qty 2

## 2020-07-14 MED ORDER — DIPHENHYDRAMINE HCL 25 MG PO CAPS
ORAL_CAPSULE | ORAL | Status: AC
  Filled 2020-07-14: qty 2

## 2020-07-14 MED ORDER — SODIUM CHLORIDE 0.9 % IV SOLN
Freq: Once | INTRAVENOUS | Status: AC
  Filled 2020-07-14: qty 250

## 2020-07-14 MED ORDER — SODIUM CHLORIDE 0.9 % IV SOLN
20.0000 mg | Freq: Once | INTRAVENOUS | Status: AC
  Administered 2020-07-14: 20 mg via INTRAVENOUS
  Filled 2020-07-14: qty 20

## 2020-07-14 MED ORDER — DEXTROSE 5 % IV SOLN
56.0000 mg/m2 | Freq: Once | INTRAVENOUS | Status: AC
  Administered 2020-07-14: 110 mg via INTRAVENOUS
  Filled 2020-07-14: qty 30

## 2020-07-14 MED ORDER — ZOLEDRONIC ACID 4 MG/100ML IV SOLN
INTRAVENOUS | Status: AC
  Filled 2020-07-14: qty 100

## 2020-07-14 MED ORDER — DIPHENHYDRAMINE HCL 25 MG PO CAPS
50.0000 mg | ORAL_CAPSULE | Freq: Once | ORAL | Status: AC
  Administered 2020-07-14: 50 mg via ORAL

## 2020-07-14 MED ORDER — POTASSIUM CHLORIDE CRYS ER 20 MEQ PO TBCR
40.0000 meq | EXTENDED_RELEASE_TABLET | Freq: Once | ORAL | Status: AC
  Administered 2020-07-14: 40 meq via ORAL

## 2020-07-14 MED ORDER — ZOLEDRONIC ACID 4 MG/100ML IV SOLN
4.0000 mg | Freq: Once | INTRAVENOUS | Status: AC
  Administered 2020-07-14: 4 mg via INTRAVENOUS

## 2020-07-14 MED ORDER — FAMOTIDINE IN NACL 20-0.9 MG/50ML-% IV SOLN
20.0000 mg | Freq: Once | INTRAVENOUS | Status: AC
  Administered 2020-07-14: 20 mg via INTRAVENOUS

## 2020-07-14 MED ORDER — ACETAMINOPHEN 325 MG PO TABS
ORAL_TABLET | ORAL | Status: AC
  Filled 2020-07-14: qty 2

## 2020-07-14 MED ORDER — SODIUM CHLORIDE 0.9% FLUSH
10.0000 mL | INTRAVENOUS | Status: DC | PRN
  Administered 2020-07-14: 10 mL via INTRAVENOUS
  Filled 2020-07-14: qty 10

## 2020-07-14 MED ORDER — POTASSIUM CHLORIDE 20 MEQ PO PACK: 40.0000 meq | PACK | ORAL | Status: DC

## 2020-07-14 MED ORDER — ACETAMINOPHEN 325 MG PO TABS
650.0000 mg | ORAL_TABLET | Freq: Once | ORAL | Status: AC
  Administered 2020-07-14: 650 mg via ORAL

## 2020-07-14 MED ORDER — FAMOTIDINE IN NACL 20-0.9 MG/50ML-% IV SOLN
INTRAVENOUS | Status: AC
  Filled 2020-07-14: qty 50

## 2020-07-14 MED ORDER — AZITHROMYCIN 250 MG PO TABS: ORAL_TABLET | ORAL | 0 refills | Status: DC

## 2020-07-14 NOTE — Progress Notes (Signed)
Mrs. Tracy [name] was seen in the infusion room today during her treatment.  She has a history of chronic bronchitis.  She reports that she is having a recurrence of a productive cough with yellowish sputum.  A brief exam showed lungs that were clear to auscultation bilaterally without wheezes rales or rhonchi.  Her cardiovascular exam showed regular rate and rhythm without murmurs rubs or gallops.  She did begin coughing while her lungs will be auscultated.  A prescription for azithromycin was sent to her pharmacy.  Sandi Mealy, MHS, PA-C Physician Assistant

## 2020-07-14 NOTE — Patient Instructions (Signed)
[place] Discharge Instructions for Patients Receiving Chemotherapy  Today you received the following chemotherapy agents darzalex, kyprolis  To help prevent nausea and vomiting after your treatment, we encourage you to take your nausea medication as directed.    If you develop nausea and vomiting that is not controlled by your nausea medication, call the clinic.   BELOW ARE SYMPTOMS THAT SHOULD BE REPORTED IMMEDIATELY:  *FEVER GREATER THAN 100.5 F  *CHILLS WITH OR WITHOUT FEVER  NAUSEA AND VOMITING THAT IS NOT CONTROLLED WITH YOUR NAUSEA MEDICATION  *UNUSUAL SHORTNESS OF BREATH  *UNUSUAL BRUISING OR BLEEDING  TENDERNESS IN MOUTH AND THROAT WITH OR WITHOUT PRESENCE OF ULCERS  *URINARY PROBLEMS  *BOWEL PROBLEMS  UNUSUAL RASH Items with * indicate a potential emergency and should be followed up as soon as possible.  Feel free to call the clinic should you have any questions or concerns. The clinic phone number is [phone].  Please show the [place] at check-in to the Emergency Department and triage nurse.

## 2020-07-27 ENCOUNTER — Inpatient Hospital Stay: Payer: Medicare Other

## 2020-07-27 ENCOUNTER — Other Ambulatory Visit: Payer: Self-pay

## 2020-07-27 VITALS — BP 174/73 | HR 90 | Temp 98.7°F | Resp 16

## 2020-07-27 DIAGNOSIS — Z853 Personal history of malignant neoplasm of breast: Secondary | ICD-10-CM | POA: Diagnosis not present

## 2020-07-27 DIAGNOSIS — C9000 Multiple myeloma not having achieved remission: Secondary | ICD-10-CM

## 2020-07-27 DIAGNOSIS — Z8673 Personal history of transient ischemic attack (TIA), and cerebral infarction without residual deficits: Secondary | ICD-10-CM | POA: Diagnosis not present

## 2020-07-27 DIAGNOSIS — E1140 Type 2 diabetes mellitus with diabetic neuropathy, unspecified: Secondary | ICD-10-CM | POA: Diagnosis not present

## 2020-07-27 DIAGNOSIS — E1122 Type 2 diabetes mellitus with diabetic chronic kidney disease: Secondary | ICD-10-CM | POA: Diagnosis not present

## 2020-07-27 DIAGNOSIS — Z7189 Other specified counseling: Secondary | ICD-10-CM

## 2020-07-27 DIAGNOSIS — Z95828 Presence of other vascular implants and grafts: Secondary | ICD-10-CM

## 2020-07-27 DIAGNOSIS — N189 Chronic kidney disease, unspecified: Secondary | ICD-10-CM | POA: Diagnosis not present

## 2020-07-27 DIAGNOSIS — I252 Old myocardial infarction: Secondary | ICD-10-CM | POA: Diagnosis not present

## 2020-07-27 DIAGNOSIS — Z923 Personal history of irradiation: Secondary | ICD-10-CM | POA: Diagnosis not present

## 2020-07-27 DIAGNOSIS — Z5112 Encounter for antineoplastic immunotherapy: Secondary | ICD-10-CM | POA: Diagnosis not present

## 2020-07-27 DIAGNOSIS — Z87891 Personal history of nicotine dependence: Secondary | ICD-10-CM | POA: Diagnosis not present

## 2020-07-27 LAB — CMP (CANCER CENTER ONLY)
ALT: 10 U/L (ref 0–44)
AST: 10 U/L — ABNORMAL LOW (ref 15–41)
Albumin: 3.4 g/dL — ABNORMAL LOW (ref 3.5–5.0)
Alkaline Phosphatase: 73 U/L (ref 38–126)
Anion gap: 9 (ref 5–15)
BUN: 10 mg/dL (ref 8–23)
CO2: 25 mmol/L (ref 22–32)
Calcium: 8.4 mg/dL — ABNORMAL LOW (ref 8.9–10.3)
Chloride: 111 mmol/L (ref 98–111)
Creatinine: 1.1 mg/dL — ABNORMAL HIGH (ref 0.44–1.00)
GFR, Estimated: 52 mL/min — ABNORMAL LOW (ref >60)
Glucose, Bld: 108 mg/dL — ABNORMAL HIGH (ref 70–99)
Potassium: 3.3 mmol/L — ABNORMAL LOW (ref 3.5–5.1)
Sodium: 145 mmol/L (ref 135–145)
Total Bilirubin: 0.3 mg/dL (ref 0.3–1.2)
Total Protein: 6.5 g/dL (ref 6.5–8.1)

## 2020-07-27 LAB — CBC WITH DIFFERENTIAL/PLATELET
Abs Immature Granulocytes: 0.01 10*3/uL (ref 0.00–0.07)
Basophils Absolute: 0 10*3/uL (ref 0.0–0.1)
Basophils Relative: 0 %
Eosinophils Absolute: 0.1 10*3/uL (ref 0.0–0.5)
Eosinophils Relative: 1 %
HCT: 26.7 % — ABNORMAL LOW (ref 36.0–46.0)
Hemoglobin: 9 g/dL — ABNORMAL LOW (ref 12.0–15.0)
Immature Granulocytes: 0 %
Lymphocytes Relative: 24 %
Lymphs Abs: 1.3 10*3/uL (ref 0.7–4.0)
MCH: 31.4 pg (ref 26.0–34.0)
MCHC: 33.7 g/dL (ref 30.0–36.0)
MCV: 93 fL (ref 80.0–100.0)
Monocytes Absolute: 0.3 10*3/uL (ref 0.1–1.0)
Monocytes Relative: 6 %
Neutro Abs: 3.7 10*3/uL (ref 1.7–7.7)
Neutrophils Relative %: 69 %
Platelets: 279 10*3/uL (ref 150–400)
RBC: 2.87 MIL/uL — ABNORMAL LOW (ref 3.87–5.11)
RDW: 14.4 % (ref 11.5–15.5)
WBC: 5.3 10*3/uL (ref 4.0–10.5)
nRBC: 0 % (ref 0.0–0.2)

## 2020-07-27 MED ORDER — SODIUM CHLORIDE 0.9 % IV SOLN
20.0000 mg | Freq: Once | INTRAVENOUS | Status: AC
  Administered 2020-07-27: 20 mg via INTRAVENOUS
  Filled 2020-07-27: qty 20

## 2020-07-27 MED ORDER — ACETAMINOPHEN 325 MG PO TABS
650.0000 mg | ORAL_TABLET | Freq: Once | ORAL | Status: AC
  Administered 2020-07-27: 650 mg via ORAL

## 2020-07-27 MED ORDER — SODIUM CHLORIDE 0.9% FLUSH
10.0000 mL | INTRAVENOUS | Status: DC | PRN
  Administered 2020-07-27: 10 mL
  Filled 2020-07-27: qty 10

## 2020-07-27 MED ORDER — HEPARIN SOD (PORK) LOCK FLUSH 100 UNIT/ML IV SOLN
500.0000 [IU] | Freq: Once | INTRAVENOUS | Status: AC | PRN
  Administered 2020-07-27: 500 [IU]
  Filled 2020-07-27: qty 5

## 2020-07-27 MED ORDER — SODIUM CHLORIDE 0.9 % IV SOLN
Freq: Once | INTRAVENOUS | Status: AC
Start: 2020-07-27 — End: 2020-07-27
  Filled 2020-07-27: qty 250

## 2020-07-27 MED ORDER — DIPHENHYDRAMINE HCL 25 MG PO CAPS
ORAL_CAPSULE | ORAL | Status: AC
  Filled 2020-07-27: qty 2

## 2020-07-27 MED ORDER — SODIUM CHLORIDE 0.9% FLUSH
10.0000 mL | Freq: Once | INTRAVENOUS | Status: AC
  Administered 2020-07-27: 10 mL via INTRAVENOUS
  Filled 2020-07-27: qty 10

## 2020-07-27 MED ORDER — FAMOTIDINE IN NACL 20-0.9 MG/50ML-% IV SOLN
20.0000 mg | Freq: Once | INTRAVENOUS | Status: AC
  Administered 2020-07-27: 20 mg via INTRAVENOUS

## 2020-07-27 MED ORDER — DIPHENHYDRAMINE HCL 25 MG PO CAPS
50.0000 mg | ORAL_CAPSULE | Freq: Once | ORAL | Status: AC
  Administered 2020-07-27: 50 mg via ORAL

## 2020-07-27 MED ORDER — FAMOTIDINE IN NACL 20-0.9 MG/50ML-% IV SOLN
INTRAVENOUS | Status: AC
  Filled 2020-07-27: qty 50

## 2020-07-27 MED ORDER — DEXTROSE 5 % IV SOLN
56.0000 mg/m2 | Freq: Once | INTRAVENOUS | Status: AC
  Administered 2020-07-27: 110 mg via INTRAVENOUS
  Filled 2020-07-27: qty 30

## 2020-07-27 MED ORDER — ACETAMINOPHEN 325 MG PO TABS
ORAL_TABLET | ORAL | Status: AC
  Filled 2020-07-27: qty 2

## 2020-07-27 MED ORDER — SODIUM CHLORIDE 0.9 % IV SOLN
16.0000 mg/kg | Freq: Once | INTRAVENOUS | Status: AC
  Administered 2020-07-27: 1400 mg via INTRAVENOUS
  Filled 2020-07-27: qty 60

## 2020-07-27 NOTE — Patient Instructions (Signed)
[facility] Discharge Instructions for Patients Receiving Chemotherapy  Today you received the following chemotherapy agents: carfilzomib and daratumumab.  To help prevent nausea and vomiting after your treatment, we encourage you to take your nausea medication as directed.   If you develop nausea and vomiting that is not controlled by your nausea medication, call the clinic.   BELOW ARE SYMPTOMS THAT SHOULD BE REPORTED IMMEDIATELY:  *FEVER GREATER THAN 100.5 F  *CHILLS WITH OR WITHOUT FEVER  NAUSEA AND VOMITING THAT IS NOT CONTROLLED WITH YOUR NAUSEA MEDICATION  *UNUSUAL SHORTNESS OF BREATH  *UNUSUAL BRUISING OR BLEEDING  TENDERNESS IN MOUTH AND THROAT WITH OR WITHOUT PRESENCE OF ULCERS  *URINARY PROBLEMS  *BOWEL PROBLEMS  UNUSUAL RASH Items with * indicate a potential emergency and should be followed up as soon as possible.  Feel free to call the clinic should you have any questions or concerns. The clinic phone number is [phone].  Please show the CHEMO ALERT CARD at check-in to the Emergency Department and triage nurse.

## 2020-07-29 HISTORY — PX: GIVENS CAPSULE STUDY: SHX5432

## 2020-07-31 ENCOUNTER — Telehealth: Payer: Self-pay | Admitting: Gastroenterology

## 2020-07-31 ENCOUNTER — Telehealth: Payer: Self-pay | Admitting: Family Medicine

## 2020-07-31 NOTE — Telephone Encounter (Signed)
Spoke with patient she will call back to schedule her AWV, need to check on other appts first.

## 2020-07-31 NOTE — Telephone Encounter (Signed)
Left message on machine to call back  

## 2020-07-31 NOTE — Telephone Encounter (Signed)
Pt is requesting a call back from a nurse to reschedule her Capsule EGD

## 2020-08-01 NOTE — Telephone Encounter (Signed)
No, she does not need an [place] for this service with her insurance.

## 2020-08-01 NOTE — Telephone Encounter (Signed)
Inbound call from patient returning your call. 

## 2020-08-01 NOTE — Telephone Encounter (Signed)
The pt has been rescheduled for 08/08/20.  She has instructions and form completed and in [employer] office. All questions answered and she will call back with any further concerns.    Amy this pt was scheduled in December and rescheduled.  Does she need another prior auth?

## 2020-08-04 ENCOUNTER — Other Ambulatory Visit: Payer: Medicare Other

## 2020-08-04 ENCOUNTER — Other Ambulatory Visit: Payer: Self-pay

## 2020-08-04 ENCOUNTER — Inpatient Hospital Stay: Payer: Medicare Other | Attending: Hematology

## 2020-08-04 ENCOUNTER — Ambulatory Visit: Payer: Medicare Other

## 2020-08-04 ENCOUNTER — Inpatient Hospital Stay: Payer: Medicare Other | Admitting: Hematology

## 2020-08-04 ENCOUNTER — Inpatient Hospital Stay: Payer: Medicare Other

## 2020-08-04 VITALS — BP 153/60 | HR 79 | Temp 97.7°F | Resp 17 | Ht 61.54 in | Wt 181.9 lb

## 2020-08-04 DIAGNOSIS — C9000 Multiple myeloma not having achieved remission: Secondary | ICD-10-CM

## 2020-08-04 DIAGNOSIS — Z5112 Encounter for antineoplastic immunotherapy: Secondary | ICD-10-CM

## 2020-08-04 DIAGNOSIS — D509 Iron deficiency anemia, unspecified: Secondary | ICD-10-CM | POA: Diagnosis not present

## 2020-08-04 DIAGNOSIS — Z87891 Personal history of nicotine dependence: Secondary | ICD-10-CM | POA: Insufficient documentation

## 2020-08-04 DIAGNOSIS — Z853 Personal history of malignant neoplasm of breast: Secondary | ICD-10-CM | POA: Diagnosis not present

## 2020-08-04 DIAGNOSIS — Z8673 Personal history of transient ischemic attack (TIA), and cerebral infarction without residual deficits: Secondary | ICD-10-CM | POA: Insufficient documentation

## 2020-08-04 DIAGNOSIS — Z7189 Other specified counseling: Secondary | ICD-10-CM

## 2020-08-04 DIAGNOSIS — I252 Old myocardial infarction: Secondary | ICD-10-CM | POA: Insufficient documentation

## 2020-08-04 DIAGNOSIS — Z923 Personal history of irradiation: Secondary | ICD-10-CM | POA: Diagnosis not present

## 2020-08-04 DIAGNOSIS — Z95828 Presence of other vascular implants and grafts: Secondary | ICD-10-CM | POA: Insufficient documentation

## 2020-08-04 DIAGNOSIS — N189 Chronic kidney disease, unspecified: Secondary | ICD-10-CM | POA: Insufficient documentation

## 2020-08-04 DIAGNOSIS — E1140 Type 2 diabetes mellitus with diabetic neuropathy, unspecified: Secondary | ICD-10-CM | POA: Insufficient documentation

## 2020-08-04 DIAGNOSIS — E1122 Type 2 diabetes mellitus with diabetic chronic kidney disease: Secondary | ICD-10-CM | POA: Insufficient documentation

## 2020-08-04 LAB — CBC WITH DIFFERENTIAL/PLATELET
Abs Immature Granulocytes: 0.02 10*3/uL (ref 0.00–0.07)
Basophils Absolute: 0 10*3/uL (ref 0.0–0.1)
Basophils Relative: 1 %
Eosinophils Absolute: 0.1 10*3/uL (ref 0.0–0.5)
Eosinophils Relative: 1 %
HCT: 27.1 % — ABNORMAL LOW (ref 36.0–46.0)
Hemoglobin: 9 g/dL — ABNORMAL LOW (ref 12.0–15.0)
Immature Granulocytes: 0 %
Lymphocytes Relative: 25 %
Lymphs Abs: 1.3 10*3/uL (ref 0.7–4.0)
MCH: 31.4 pg (ref 26.0–34.0)
MCHC: 33.2 g/dL (ref 30.0–36.0)
MCV: 94.4 fL (ref 80.0–100.0)
Monocytes Absolute: 0.3 10*3/uL (ref 0.1–1.0)
Monocytes Relative: 7 %
Neutro Abs: 3.5 10*3/uL (ref 1.7–7.7)
Neutrophils Relative %: 66 %
Platelets: 233 10*3/uL (ref 150–400)
RBC: 2.87 MIL/uL — ABNORMAL LOW (ref 3.87–5.11)
RDW: 15.7 % — ABNORMAL HIGH (ref 11.5–15.5)
WBC: 5.2 10*3/uL (ref 4.0–10.5)
nRBC: 0 % (ref 0.0–0.2)

## 2020-08-04 LAB — CMP (CANCER CENTER ONLY)
ALT: 9 U/L (ref 0–44)
AST: 10 U/L — ABNORMAL LOW (ref 15–41)
Albumin: 3.5 g/dL (ref 3.5–5.0)
Alkaline Phosphatase: 75 U/L (ref 38–126)
Anion gap: 12 (ref 5–15)
BUN: 12 mg/dL (ref 8–23)
CO2: 21 mmol/L — ABNORMAL LOW (ref 22–32)
Calcium: 9.2 mg/dL (ref 8.9–10.3)
Chloride: 109 mmol/L (ref 98–111)
Creatinine: 1.25 mg/dL — ABNORMAL HIGH (ref 0.44–1.00)
GFR, Estimated: 45 mL/min — ABNORMAL LOW (ref >60)
Glucose, Bld: 175 mg/dL — ABNORMAL HIGH (ref 70–99)
Potassium: 3.9 mmol/L (ref 3.5–5.1)
Sodium: 142 mmol/L (ref 135–145)
Total Bilirubin: 0.4 mg/dL (ref 0.3–1.2)
Total Protein: 6.6 g/dL (ref 6.5–8.1)

## 2020-08-04 MED ORDER — SODIUM CHLORIDE 0.9 % IV SOLN
Freq: Once | INTRAVENOUS | Status: AC
Start: 2020-08-04 — End: 2020-08-04
  Filled 2020-08-04: qty 250

## 2020-08-04 MED ORDER — ONDANSETRON HCL 4 MG/2ML IJ SOLN
8.0000 mg | Freq: Once | INTRAMUSCULAR | Status: AC
  Administered 2020-08-04: 8 mg via INTRAVENOUS

## 2020-08-04 MED ORDER — DEXTROSE 5 % IV SOLN
56.0000 mg/m2 | Freq: Once | INTRAVENOUS | Status: AC
  Administered 2020-08-04: 110 mg via INTRAVENOUS
  Filled 2020-08-04: qty 10

## 2020-08-04 MED ORDER — ONDANSETRON HCL 4 MG/2ML IJ SOLN
INTRAMUSCULAR | Status: AC
  Filled 2020-08-04: qty 4

## 2020-08-04 MED ORDER — ACETAMINOPHEN 325 MG PO TABS
650.0000 mg | ORAL_TABLET | Freq: Once | ORAL | Status: AC
  Administered 2020-08-04: 650 mg via ORAL

## 2020-08-04 MED ORDER — DIPHENHYDRAMINE HCL 25 MG PO CAPS
ORAL_CAPSULE | ORAL | Status: AC
  Filled 2020-08-04: qty 2

## 2020-08-04 MED ORDER — DEXAMETHASONE SODIUM PHOSPHATE 100 MG/10ML IJ SOLN
20.0000 mg | Freq: Once | INTRAMUSCULAR | Status: AC
  Administered 2020-08-04: 20 mg via INTRAVENOUS
  Filled 2020-08-04: qty 20

## 2020-08-04 MED ORDER — HEPARIN SOD (PORK) LOCK FLUSH 100 UNIT/ML IV SOLN
500.0000 [IU] | Freq: Once | INTRAVENOUS | Status: AC | PRN
  Administered 2020-08-04: 500 [IU]
  Filled 2020-08-04: qty 5

## 2020-08-04 MED ORDER — DIPHENHYDRAMINE HCL 25 MG PO CAPS
50.0000 mg | ORAL_CAPSULE | Freq: Once | ORAL | Status: AC
  Administered 2020-08-04: 50 mg via ORAL

## 2020-08-04 MED ORDER — SODIUM CHLORIDE 0.9% FLUSH
10.0000 mL | Freq: Once | INTRAVENOUS | Status: AC
  Administered 2020-08-04: 10 mL
  Filled 2020-08-04: qty 10

## 2020-08-04 MED ORDER — FAMOTIDINE IN NACL 20-0.9 MG/50ML-% IV SOLN
20.0000 mg | Freq: Once | INTRAVENOUS | Status: AC
  Administered 2020-08-04: 20 mg via INTRAVENOUS

## 2020-08-04 MED ORDER — SODIUM CHLORIDE 0.9% FLUSH
10.0000 mL | INTRAVENOUS | Status: DC | PRN
  Administered 2020-08-04: 10 mL
  Filled 2020-08-04: qty 10

## 2020-08-04 MED ORDER — ACETAMINOPHEN 325 MG PO TABS
ORAL_TABLET | ORAL | Status: AC
  Filled 2020-08-04: qty 2

## 2020-08-04 MED ORDER — FAMOTIDINE IN NACL 20-0.9 MG/50ML-% IV SOLN
INTRAVENOUS | Status: AC
  Filled 2020-08-04: qty 50

## 2020-08-04 NOTE — Progress Notes (Signed)
HEMATOLOGY/ONCOLOGY CLINIC NOTE  Date of Service: 08/04/2020  Patient Care Team: [name], MD as PCP - General (Family Medicine) [name], MD as Attending Physician (Physical Medicine and Rehabilitation) [name], MD as Consulting Physician (Orthopedic Surgery) [name], MD as Consulting Physician (Otolaryngology) [name], MD as Consulting Physician (Gastroenterology) [name], MD as Consulting Physician (General Surgery) [name], [facility] as Pharmacist (Pharmacist)  CHIEF COMPLAINTS/PURPOSE OF CONSULTATION:  Multiple Myeloma not having achieved remission  HISTORY OF PRESENTING ILLNESS:  Tracy [name] is a wonderful 75 y.o. female who has been referred to [place] by Dr [name] for evaluation and management of Multiple Myeloma. Pt is accompanied today by her daughter in person and other daughter and granddaughter via phone. The pt reports that she is doing well overall.   When pt was first diagnosed with Breast Cancer it was localized in both of her breasts and was considered Stage 1. She then received chemotherapy. However, she declined chemotherapy a second time and chose radiation therapy after recurrence. Pt has been under the surveillance of Dr. [name] after declining antiestrogen therapy.   The pt reports that she was having difficulty breathing so she saw her Pulmonologist, who ordered the work up. Based on the results of the PET/CT pt was sent to Dr. [name] as they were concerned that her breast cancer had spread. Pt has been becoming increasingly anemic over the last year, despite using a PO Iron supplement. She has had a positive Fecal Occult tests, but has been having issues with hemorrhoids and hemorrhoidal bleeding for the last 5-6 months. Pt denies any blood in her stools but notices blood on the tissue after she wipes. Pt has also been having some discomfort in her left hip and lower back. This pain is worsened when she sits down.    Pt has had a chronic cough that has been thought to either be Bronchitis or a result of acid reflux. Pt is planning to receive an Endoscopy to work this up further. She is currently taking Gabapentin for tingling/burning in her extremities. Pt has Type II Diabetes and was previously using Metformin, but discontinued due to concern for liver damage. She is not currently using any medications to control her Diabetes.   Pt had a heart attack in 2001. She initially though that she had a [employer], until the sensation began to travel up her body. She was given Nitroglycerin upon admittance to the hospital. Pt did not require any stents or other interventions. No cause for her heart attack was ever discovered. Pt has been seen by Neurology, Dr. Leta Baptist, who completed imaging on her neck and found a degeneratve disk. Pt has not had a nerve study conducted. Pt has no history of Shingles and has not received the Shingles vaccine. She has been fully vaccinated against he COVID19 virus.    Of note prior to the patient's visit today, pt has had PET/CT ([id number]) completed on 01/20/2020 with results revealing "1. Hypermetabolism corresponding to left acetabular and L5 lytic lesions, with differential considerations of metastatic disease or myeloma. 2. No evidence of hypermetabolic soft tissue primary, metastatic disease, or soft tissue myeloma. 3. Incidental findings, including: Aortic atherosclerosis (ICD10-I70.0) and emphysema (ICD10-J43.9). Hepatic steatosis."   Pt has had Bone Marrow Surgical Pathology ([id number]) completed on 01/17/2020 which revealed "BONE MARROW, ASPIRATE, CLOT, CORE: -Hypercellular bone marrow for age with plasma cell neoplasm PERIPHERAL BLOOD: -Normocytic-normochromic anemia"  Pt has had Left Ischium Biopsy Surgical Pathology  Report [id number]) completed on 01/17/2020 which revealed "Plasma cell neoplasm."  Most recent lab results (01/25/2020) of CBC is as follows:  all values are WNL except for RBC at 2.44, Hgb at 7.5, HCT at 24.0, CO2 at 21, Glucose at 129, Creatinine at 1.38, Total Protein at 10.1, Albumin at 2.8, Total Bilirubin at <0.2, GFR Est Afr Am at 43. 01/25/2020 K/L light chains is as follows: Kappa free light chain at 22.7, Lamda free light chains at 17.7, K/L light chain ratio at 1.28 01/25/2020 MMP is as follows: all values are WNL except for IgG at 5220, Total Protein at 10.1, Alpha2 Glob at 1.2, Gamma Glob at 3.9, M Protein at 3.7, Total Globulin at 6.7, Albumin/Glob at 0.6. 01/25/2020 24-hr UPEP shows all values are WNL  On review of systems, pt reports lower back/left hip pain, unexpected weight loss, abdominal pain, loose stools and denies low appetite, constipation, rashes and any other symptoms.   On PMHx the pt reports Breast Cancer, Breast Lumpectomy, TIA, Restless leg, Myocardial infarction, HTN, HLD, Chronic Bronchitis.  INTERVAL HISTORY: Tracy [name] is a wonderful 75 y.o. female who is here for evaluation and management of Multiple Myeloma. She is here for C7D8 Daratumumab. The patient's last visit with [place] was on 07/05/2020. The pt reports that she is doing well overall.  The pt reports that she has been undergoing much familial heartbreak.   The pt notes that her legs and feet have been swelling, but recently started to decrease. The pt notes this may have been caused by flying and traveling over the holiday season. However, the original swelling started prior to these travels. They are still slightly swollen.The pt thought this was due to the potassium pill, holding this for a it. She has since started back on it.  The pt notes that on Monday she had a single very dark stool movement. The subsequent bowel habits have been normal.   Lab results today (08/04/20) of CBC w/diff and CMP is as follows: all values are WNL except for CO2 at 21, Glucose at 175, Creatinine at 1.25, AST at 10, GFR Est at 45, RBC at 2.87, Hgb at 9.0, HCT  at 27.1, RDW at 15.7. 08/04/2020 Multiple Myeloma Panel - M spike improved to 0.3g/dl  On review of systems, pt reports dark stools, slight nausea, leg swelling and denies vomiting, fevers, chills, abdominal pain and any other symptoms.   MEDICAL HISTORY:  Past Medical History:  Diagnosis Date  . Anxiety   . Arthritis   . Breast cancer ([place]) 06/13/15  . Cancer ([place]) 2000   breast cancer  . Chronic bronchitis ([place])   . Chronic bronchitis ([place])   . Hyperlipidemia   . Hypertension   . Myocardial infarction ([place]) 2001  . Personal history of radiation therapy   . Restless leg   . Stroke [facility]) 2004   TIA, no deficits    SURGICAL HISTORY: Past Surgical History:  Procedure Laterality Date  . ABDOMINAL HYSTERECTOMY  1985  . BREAST LUMPECTOMY Left 2000   radiation and chemo  . BREAST LUMPECTOMY Right 2016   radiation  . BREAST SURGERY  2001   lt breast lumpectomy  . IR IMAGING GUIDED PORT INSERTION  04/25/2020  . RADIOACTIVE SEED GUIDED PARTIAL MASTECTOMY WITH AXILLARY SENTINEL LYMPH NODE BIOPSY Right 07/07/2015   Procedure: RIGHT RADIOACTIVE SEED GUIDED PARTIAL MASTECTOMY WITH AXILLARY SENTINEL LYMPH NODE BIOPSY;  Surgeon: Autumn Messing III, MD;  Location: [facility];  Service: General;  Laterality: Right;  . SMALL INTESTINE SURGERY    . TUBAL LIGATION      SOCIAL HISTORY: Social History   Socioeconomic History  . Marital status: Divorced    Spouse name: Not on file  . Number of children: 7  . Years of education: Not on file  . Highest education level: Not on file  Occupational History  . Occupation: retired  Tobacco Use  . Smoking status: Former Smoker    Packs/day: 1.00    Years: 20.00    Pack years: 20.00    Types: Cigarettes    Quit date: 07/30/2011    Years since quitting: 9.0  . Smokeless tobacco: Never Used  . Tobacco comment: Quit >4 years ago; 1 ppd for about 5/20 years (remaining was less)  Vaping Use  . Vaping Use: Former  Substance and Sexual  Activity  . Alcohol use: No    Alcohol/week: 0.0 standard drinks  . Drug use: No  . Sexual activity: Not Currently  Other Topics Concern  . Not on file  Social History Narrative   Lives alone.  Retired.  Education:  11th grade GED.  Children:  7 (one here).    Social Determinants of Health   Financial Resource Strain: Not on file  Food Insecurity: No Food Insecurity  . Worried About Charity fundraiser in the Last Year: Never true  . Ran Out of Food in the Last Year: Never true  Transportation Needs: No Transportation Needs  . Lack of Transportation (Medical): No  . Lack of Transportation (Non-Medical): No  Physical Activity: Not on file  Stress: Not on file  Social Connections: Not on file  Intimate Partner Violence: Not on file    FAMILY HISTORY: Family History  Problem Relation Age of Onset  . Diabetes Father   . Lung cancer Sister        dx. <50; former smoker  . Diabetes Brother   . Diabetes Paternal Aunt   . Stroke Maternal Grandmother   . Diabetes Paternal Grandmother   . Emphysema Mother 28       smoker  . Diabetes Brother   . Brain cancer Brother 46       unknown tumor type  . Cancer Daughter 72       neck cancer  . Other Daughter        hysterectomy for unspecified reason  . Breast cancer Cousin   . Cancer Cousin        unspecified type  . Breast cancer Other        triple negative breast cancer in her 4s  . Cancer Daughter   . Colon polyps Neg Hx   . Esophageal cancer Neg Hx   . Gallbladder disease Neg Hx     ALLERGIES:  is allergic to bacitracin-neomycin-polymyxin  [neomycin-bacitracin zn-polymyx], nsaids, tape, ambien [zolpidem tartrate], amoxicillin, contrast media [iodinated diagnostic agents], latex, and prednisone.  MEDICATIONS:  Current Outpatient Medications  Medication Sig Dispense Refill  . Accu-Chek Softclix Lancets lancets Use as instructed to check sugars 1-2 times daily. 100 each 12  . acyclovir (ZOVIRAX) 400 MG tablet Take 1  tablet (400 mg total) by mouth 2 (two) times daily. 60 tablet 11  . albuterol (VENTOLIN HFA) 108 (90 Base) MCG/ACT inhaler Inhale 2 puffs into the lungs every 6 (six) hours as needed for wheezing or shortness of breath. 8 g 2  . aspirin 81 MG tablet Take 81 mg by mouth daily.    Marland Kitchen atorvastatin (  LIPITOR) 10 MG tablet TAKE 1 TABLET BY MOUTH  DAILY 90 tablet 3  . azithromycin (ZITHROMAX Z-PAK) 250 MG tablet 2 tablets day 1 and then 1 tablet days 2 through 5 6 each 0  . dexamethasone (DECADRON) 4 MG tablet Take 3 tabs with breakfast the day after each treatment. 20 tablet 4  . ergocalciferol (VITAMIN D2) 1.25 MG (50000 UT) capsule Take 1 capsule (50,000 Units total) by mouth once a week. 12 capsule 2  . ferrous sulfate 325 (65 FE) MG tablet Take 1 tablet (325 mg total) by mouth daily with breakfast. 30 tablet 6  . fluticasone (FLOVENT HFA) 110 MCG/ACT inhaler Inhale 2 puffs into the lungs in the morning and at bedtime. 1 each 12  . gabapentin (NEURONTIN) 300 MG capsule TAKE 1 CAPSULE BY MOUTH 3  TIMES DAILY 270 capsule 3  . glucose blood (ACCU-CHEK GUIDE) test strip Use as instructed to check sugars 1-2 times daily. 100 each 12  . HYDROcodone-acetaminophen (NORCO) 5-325 MG tablet Take 1 tablet by mouth every 6 (six) hours as needed for moderate pain or severe pain. (Patient not taking: Reported on 05/16/2020) 60 tablet 0  . lidocaine-prilocaine (EMLA) cream Apply 1 application topically as needed. 30 g 0  . loratadine (CLARITIN) 10 MG tablet Take 10 mg by mouth daily.    Marland Kitchen LORazepam (ATIVAN) 0.5 MG tablet Take 1 tablet (0.5 mg total) by mouth every 6 (six) hours as needed (Nausea or vomiting). 30 tablet 0  . losartan (COZAAR) 100 MG tablet TAKE 1 TABLET BY MOUTH  DAILY 90 tablet 3  . MELATONIN PO Take by mouth.     . metFORMIN (GLUCOPHAGE) 500 MG tablet TAKE 1 TABLET(500 MG) BY MOUTH TWICE DAILY WITH A MEAL 180 tablet 0  . mometasone (NASONEX) 50 MCG/ACT nasal spray Place 2 sprays into the nose daily.  17 g 12  . ondansetron (ZOFRAN) 8 MG tablet Take 1 tablet (8 mg total) by mouth 2 (two) times daily as needed (Nausea or vomiting). 30 tablet 1  . pantoprazole (PROTONIX) 40 MG tablet TAKE 1 TABLET BY MOUTH  TWICE DAILY 180 tablet 1  . potassium chloride SA (KLOR-CON) 20 MEQ tablet Take 1 tablet (20 mEq total) by mouth 2 (two) times daily. 60 tablet 1  . prochlorperazine (COMPAZINE) 10 MG tablet Take 1 tablet (10 mg total) by mouth every 6 (six) hours as needed (Nausea or vomiting). 30 tablet 1  . sertraline (ZOLOFT) 50 MG tablet TAKE 1 TABLET(50 MG) BY MOUTH DAILY (Patient not taking: Reported on 06/28/2020) 90 tablet 0  . tiZANidine (ZANAFLEX) 4 MG tablet TAKE 1 TABLET(4 MG) BY MOUTH AT BEDTIME 30 tablet 3  . traZODone (DESYREL) 100 MG tablet TAKE 1 TABLET BY MOUTH AT  BEDTIME 90 tablet 3   No current facility-administered medications for this visit.   Facility-Administered Medications Ordered in Other Visits  Medication Dose Route Frequency Provider Last Rate Last Admin  . 0.9 %  sodium chloride infusion   Intravenous Once [name], MD 500 mL/hr at 08/04/20 1241 New Bag at 08/04/20 1241  . carfilzomib (KYPROLIS) 110 mg in dextrose 5 % 100 mL chemo infusion  56 mg/m2 (Treatment Plan Recorded) Intravenous Once [name], MD      . dexamethasone (DECADRON) 20 mg in sodium chloride 0.9 % 50 mL IVPB  20 mg Intravenous Once [name], MD      . famotidine (PEPCID) IVPB 20 mg premix  20 mg Intravenous Once  [name], MD 200 mL/hr at 08/04/20 1244 20 mg at 08/04/20 1244  . heparin lock flush 100 unit/mL  500 Units Intracatheter Once PRN [name], MD      . sodium chloride flush (NS) 0.9 % injection 10 mL  10 mL Intracatheter PRN [name], MD        REVIEW OF SYSTEMS:   A 10+ POINT REVIEW OF SYSTEMS WAS OBTAINED including neurology, dermatology, psychiatry, cardiac, respiratory, lymph, extremities, GI, GU, Musculoskeletal,  constitutional, breasts, reproductive, HEENT.  All pertinent positives are noted in the HPI.  All others are negative.   PHYSICAL EXAMINATION: ECOG PERFORMANCE STATUS: 2 - Symptomatic, <50% confined to bed  . VS reviewed  The exam was given in a chair.  GENERAL:alert, in no acute distress and comfortable SKIN: no acute rashes, no significant lesions EYES: conjunctiva are pink and non-injected, sclera anicteric OROPHARYNX: MMM, no exudates, no oropharyngeal erythema or ulceration NECK: supple, no JVD LYMPH:  no palpable lymphadenopathy in the cervical, axillary or inguinal regions LUNGS: clear to auscultation b/l with normal respiratory effort HEART: regular rate & rhythm ABDOMEN:  normoactive bowel sounds , non tender, not distended. No palpable hepatosplenomegaly.  Extremity: trace pedal edema b/l PSYCH: alert & oriented x 3 with fluent speech NEURO: no focal motor/sensory deficits  LABORATORY DATA:  I have reviewed the data as listed  . CBC Latest Ref Rng & Units 08/04/2020 07/27/2020 07/14/2020  WBC 4.0 - 10.5 K/uL 5.2 5.3 5.7  Hemoglobin 12.0 - 15.0 g/dL 9.0(L) 9.0(L) 9.0(L)  Hematocrit 36.0 - 46.0 % 27.1(L) 26.7(L) 27.3(L)  Platelets 150 - 400 K/uL 233 279 214    . CMP Latest Ref Rng & Units 08/04/2020 07/27/2020 07/14/2020  Glucose 70 - 99 mg/dL 175(H) 108(H) 183(H)  BUN 8 - 23 mg/dL _0 Creatinine 0.44 - 1.00 mg/dL 1.25(H) 1.10(H) 1.12(H)  Sodium 135 - 145 mmol/L 142 145 142  Potassium 3.5 - 5.1 mmol/L 3.9 3.3(L) 2.9(L)  Chloride 98 - 111 mmol/L 109 111 110  CO2 22 - 32 mmol/L 21(L) 25 22  Calcium 8.9 - 10.3 mg/dL 9.2 8.4(L) 8.4(L)  Total Protein 6.5 - 8.1 g/dL 6.6 6.5 6.4(L)  Total Bilirubin 0.3 - 1.2 mg/dL 0.4 0.3 0.4  Alkaline Phos 38 - 126 U/L 75 73 76  AST 15 - 41 U/L 10(L) 10(L) 10(L)  ALT 0 - 44 U/L _1 05/16/2020 Upper Endoscopy:   05/16/2020 Colonoscopy:   01/25/2020 K/L light chains:    01/25/2020 MMP:             RADIOGRAPHIC STUDIES: I have personally reviewed the radiological images as listed and agreed with the findings in the report. No results found.  ASSESSMENT & PLAN:   75 yo with   1) Newly diagnosed IgG Kappa Multiple myeloma with bone lesions, anemia, renal insuff. M spike @ 3.7g/dl on diagnosis. 1p deletion, polymorphic variant, 13q deletion 2) h/o Dm2 3) Diabetic Neuropathy 4) CKD - likely from DM2, but could have an element of myeloma kidney. 5) h/o TIA and AMI 6) Iron deficiency   PLAN: -Discussed pt labwork today, 08/04/20; blood counts and chemistries are holding stable, m protein decreased to 0.3g/dl suggesting VGPR. -The pt has no prohibitive toxicities from continuing [place] necessitating treatment change at this time. -Advised the pt that she is responding well to the treatment at this time. -Will continue with treatment prior to deciding if maintenance  or transplant for next step. -Will proceed with Carfilzomib at 56 mg/m^2 today. -Recommend pt f/u for Capsule Endoscopy as scheduled to complete GI evaluation for GI blood losses -Continue Zometa  -Will see back in 4 weeks with C8D8   FOLLOW UP: Plz schedule C8 and C9 of Dara/Kyprolis as per orders (D1,D8 and D15 in each cycle) Portflush and labs with each treatment MD visit with C8D8 in 4 weeks    The total time spent in the appt was 30 minutes and more than 50% was on counseling and direct patient cares, ordering and management of chemotherapy.  All of the patient's questions were answered with apparent satisfaction. The patient knows to call the clinic with any problems, questions or concerns.   [name] MD [place] AAHIVMS [facility] Hematology/Oncology Physician [facility]  (Office):       [phone] (Work cell):  [phone] (Fax):           [phone]  08/04/2020 12:46 PM   I, Yevette Edwards, am acting as a scribe for Dr. [name].   .I  have reviewed the above documentation for accuracy and completeness, and I agree with the above. [name] MD

## 2020-08-04 NOTE — Patient Instructions (Signed)
[facility] Discharge Instructions for Patients Receiving Chemotherapy  Today you received the following chemotherapy agents: carfilzomib.  To help prevent nausea and vomiting after your treatment, we encourage you to take your nausea medication as directed.   If you develop nausea and vomiting that is not controlled by your nausea medication, call the clinic.   BELOW ARE SYMPTOMS THAT SHOULD BE REPORTED IMMEDIATELY:  *FEVER GREATER THAN 100.5 F  *CHILLS WITH OR WITHOUT FEVER  NAUSEA AND VOMITING THAT IS NOT CONTROLLED WITH YOUR NAUSEA MEDICATION  *UNUSUAL SHORTNESS OF BREATH  *UNUSUAL BRUISING OR BLEEDING  TENDERNESS IN MOUTH AND THROAT WITH OR WITHOUT PRESENCE OF ULCERS  *URINARY PROBLEMS  *BOWEL PROBLEMS  UNUSUAL RASH Items with * indicate a potential emergency and should be followed up as soon as possible.  Feel free to call the clinic should you have any questions or concerns. The clinic phone number is [phone].  Please show the CHEMO ALERT CARD at check-in to the Emergency Department and triage nurse.   

## 2020-08-07 LAB — MULTIPLE MYELOMA PANEL, SERUM
Albumin SerPl Elph-Mcnc: 3.5 g/dL (ref 2.9–4.4)
Albumin/Glob SerPl: 1.4 (ref 0.7–1.7)
Alpha 1: 0.2 g/dL (ref 0.0–0.4)
Alpha2 Glob SerPl Elph-Mcnc: 1.1 g/dL — ABNORMAL HIGH (ref 0.4–1.0)
B-Globulin SerPl Elph-Mcnc: 0.8 g/dL (ref 0.7–1.3)
Gamma Glob SerPl Elph-Mcnc: 0.5 g/dL (ref 0.4–1.8)
Globulin, Total: 2.6 g/dL (ref 2.2–3.9)
IgA: 52 mg/dL — ABNORMAL LOW (ref 64–422)
IgG (Immunoglobin G), Serum: 591 mg/dL (ref 586–1602)
IgM (Immunoglobulin M), Srm: 34 mg/dL (ref 26–217)
M Protein SerPl Elph-Mcnc: 0.3 g/dL — ABNORMAL HIGH
Total Protein ELP: 6.1 g/dL (ref 6.0–8.5)

## 2020-08-08 ENCOUNTER — Encounter: Payer: Self-pay | Admitting: Gastroenterology

## 2020-08-08 ENCOUNTER — Ambulatory Visit (INDEPENDENT_AMBULATORY_CARE_PROVIDER_SITE_OTHER): Payer: Self-pay | Admitting: Internal Medicine

## 2020-08-08 DIAGNOSIS — K5520 Angiodysplasia of colon without hemorrhage: Secondary | ICD-10-CM

## 2020-08-08 DIAGNOSIS — D509 Iron deficiency anemia, unspecified: Secondary | ICD-10-CM

## 2020-08-08 DIAGNOSIS — D500 Iron deficiency anemia secondary to blood loss (chronic): Secondary | ICD-10-CM

## 2020-08-08 NOTE — Progress Notes (Signed)
Pt here for capsule endo, states she completed prep without difficulty. Pt swallowed capsule without difficulty.   LAG#[id number] Capsule ID [id number] Exp:  06/27/2021

## 2020-08-09 ENCOUNTER — Inpatient Hospital Stay: Payer: Medicare Other

## 2020-08-09 ENCOUNTER — Other Ambulatory Visit: Payer: Self-pay

## 2020-08-09 VITALS — BP 145/64 | HR 89 | Temp 98.7°F | Resp 18

## 2020-08-09 DIAGNOSIS — I252 Old myocardial infarction: Secondary | ICD-10-CM | POA: Diagnosis not present

## 2020-08-09 DIAGNOSIS — Z5112 Encounter for antineoplastic immunotherapy: Secondary | ICD-10-CM | POA: Diagnosis not present

## 2020-08-09 DIAGNOSIS — Z87891 Personal history of nicotine dependence: Secondary | ICD-10-CM | POA: Diagnosis not present

## 2020-08-09 DIAGNOSIS — E1122 Type 2 diabetes mellitus with diabetic chronic kidney disease: Secondary | ICD-10-CM | POA: Diagnosis not present

## 2020-08-09 DIAGNOSIS — C9000 Multiple myeloma not having achieved remission: Secondary | ICD-10-CM

## 2020-08-09 DIAGNOSIS — Z923 Personal history of irradiation: Secondary | ICD-10-CM | POA: Diagnosis not present

## 2020-08-09 DIAGNOSIS — N189 Chronic kidney disease, unspecified: Secondary | ICD-10-CM | POA: Diagnosis not present

## 2020-08-09 DIAGNOSIS — E1140 Type 2 diabetes mellitus with diabetic neuropathy, unspecified: Secondary | ICD-10-CM | POA: Diagnosis not present

## 2020-08-09 DIAGNOSIS — Z853 Personal history of malignant neoplasm of breast: Secondary | ICD-10-CM | POA: Diagnosis not present

## 2020-08-09 DIAGNOSIS — Z8673 Personal history of transient ischemic attack (TIA), and cerebral infarction without residual deficits: Secondary | ICD-10-CM | POA: Diagnosis not present

## 2020-08-09 DIAGNOSIS — Z7189 Other specified counseling: Secondary | ICD-10-CM

## 2020-08-09 LAB — CBC WITH DIFFERENTIAL/PLATELET
Abs Immature Granulocytes: 0.02 10*3/uL (ref 0.00–0.07)
Basophils Absolute: 0 10*3/uL (ref 0.0–0.1)
Basophils Relative: 0 %
Eosinophils Absolute: 0 10*3/uL (ref 0.0–0.5)
Eosinophils Relative: 1 %
HCT: 26.3 % — ABNORMAL LOW (ref 36.0–46.0)
Hemoglobin: 9 g/dL — ABNORMAL LOW (ref 12.0–15.0)
Immature Granulocytes: 0 %
Lymphocytes Relative: 22 %
Lymphs Abs: 1.1 10*3/uL (ref 0.7–4.0)
MCH: 31.6 pg (ref 26.0–34.0)
MCHC: 34.2 g/dL (ref 30.0–36.0)
MCV: 92.3 fL (ref 80.0–100.0)
Monocytes Absolute: 0.5 10*3/uL (ref 0.1–1.0)
Monocytes Relative: 10 %
Neutro Abs: 3.3 10*3/uL (ref 1.7–7.7)
Neutrophils Relative %: 67 %
Platelets: 200 10*3/uL (ref 150–400)
RBC: 2.85 MIL/uL — ABNORMAL LOW (ref 3.87–5.11)
RDW: 15.6 % — ABNORMAL HIGH (ref 11.5–15.5)
WBC: 4.9 10*3/uL (ref 4.0–10.5)
nRBC: 0.4 % — ABNORMAL HIGH (ref 0.0–0.2)

## 2020-08-09 LAB — CMP (CANCER CENTER ONLY)
ALT: 9 U/L (ref 0–44)
AST: 11 U/L — ABNORMAL LOW (ref 15–41)
Albumin: 3.5 g/dL (ref 3.5–5.0)
Alkaline Phosphatase: 70 U/L (ref 38–126)
Anion gap: 9 (ref 5–15)
BUN: 13 mg/dL (ref 8–23)
CO2: 22 mmol/L (ref 22–32)
Calcium: 8.2 mg/dL — ABNORMAL LOW (ref 8.9–10.3)
Chloride: 111 mmol/L (ref 98–111)
Creatinine: 1.15 mg/dL — ABNORMAL HIGH (ref 0.44–1.00)
GFR, Estimated: 49 mL/min — ABNORMAL LOW (ref >60)
Glucose, Bld: 82 mg/dL (ref 70–99)
Potassium: 4 mmol/L (ref 3.5–5.1)
Sodium: 142 mmol/L (ref 135–145)
Total Bilirubin: 0.4 mg/dL (ref 0.3–1.2)
Total Protein: 6.7 g/dL (ref 6.5–8.1)

## 2020-08-09 MED ORDER — DIPHENHYDRAMINE HCL 25 MG PO CAPS
50.0000 mg | ORAL_CAPSULE | Freq: Once | ORAL | Status: AC
  Administered 2020-08-09: 50 mg via ORAL

## 2020-08-09 MED ORDER — FAMOTIDINE IN NACL 20-0.9 MG/50ML-% IV SOLN
INTRAVENOUS | Status: AC
  Filled 2020-08-09: qty 50

## 2020-08-09 MED ORDER — SODIUM CHLORIDE 0.9 % IV SOLN
20.0000 mg | Freq: Once | INTRAVENOUS | Status: AC
  Administered 2020-08-09: 20 mg via INTRAVENOUS
  Filled 2020-08-09: qty 20

## 2020-08-09 MED ORDER — FAMOTIDINE IN NACL 20-0.9 MG/50ML-% IV SOLN
20.0000 mg | Freq: Once | INTRAVENOUS | Status: AC
  Administered 2020-08-09: 20 mg via INTRAVENOUS

## 2020-08-09 MED ORDER — ACETAMINOPHEN 325 MG PO TABS
650.0000 mg | ORAL_TABLET | Freq: Once | ORAL | Status: AC
  Administered 2020-08-09: 650 mg via ORAL

## 2020-08-09 MED ORDER — SODIUM CHLORIDE 0.9% FLUSH
10.0000 mL | INTRAVENOUS | Status: DC | PRN
  Administered 2020-08-09: 10 mL
  Filled 2020-08-09: qty 10

## 2020-08-09 MED ORDER — SODIUM CHLORIDE 0.9 % IV SOLN
Freq: Once | INTRAVENOUS | Status: AC
Start: 2020-08-09 — End: 2020-08-09
  Filled 2020-08-09: qty 250

## 2020-08-09 MED ORDER — SODIUM CHLORIDE 0.9 % IV SOLN
Freq: Once | INTRAVENOUS | Status: AC
  Filled 2020-08-09: qty 250

## 2020-08-09 MED ORDER — HEPARIN SOD (PORK) LOCK FLUSH 100 UNIT/ML IV SOLN
500.0000 [IU] | Freq: Once | INTRAVENOUS | Status: AC | PRN
Start: 2020-08-09 — End: 2020-08-09
  Administered 2020-08-09: 500 [IU]
  Filled 2020-08-09: qty 5

## 2020-08-09 MED ORDER — CARFILZOMIB CHEMO INJECTION 60 MG
56.0000 mg/m2 | Freq: Once | INTRAVENOUS | Status: AC
  Administered 2020-08-09: 110 mg via INTRAVENOUS
  Filled 2020-08-09: qty 30

## 2020-08-09 MED ORDER — ZOLEDRONIC ACID 4 MG/100ML IV SOLN
INTRAVENOUS | Status: AC
  Filled 2020-08-09: qty 100

## 2020-08-09 MED ORDER — SODIUM CHLORIDE 0.9 % IV SOLN
16.0000 mg/kg | Freq: Once | INTRAVENOUS | Status: AC
  Administered 2020-08-09: 1400 mg via INTRAVENOUS
  Filled 2020-08-09: qty 60

## 2020-08-09 MED ORDER — ACETAMINOPHEN 325 MG PO TABS
ORAL_TABLET | ORAL | Status: AC
  Filled 2020-08-09: qty 2

## 2020-08-09 MED ORDER — DIPHENHYDRAMINE HCL 25 MG PO CAPS
ORAL_CAPSULE | ORAL | Status: AC
  Filled 2020-08-09: qty 2

## 2020-08-09 NOTE — Progress Notes (Signed)
Per Dr. Irene Limbo - okay to hold Zometa infusion until next infusion day.

## 2020-08-09 NOTE — Patient Instructions (Signed)
[employer] Discharge Instructions for Patients Receiving Chemotherapy  Today you received the following chemotherapy agents: Daratumumab (Darzalex) And Carfilzomib (Kyprolis)  To help prevent nausea and vomiting after your treatment, we encourage you to take your nausea medication  as prescribed.    If you develop nausea and vomiting that is not controlled by your nausea medication, call the clinic.   BELOW ARE SYMPTOMS THAT SHOULD BE REPORTED IMMEDIATELY:  *FEVER GREATER THAN 100.5 F  *CHILLS WITH OR WITHOUT FEVER  NAUSEA AND VOMITING THAT IS NOT CONTROLLED WITH YOUR NAUSEA MEDICATION  *UNUSUAL SHORTNESS OF BREATH  *UNUSUAL BRUISING OR BLEEDING  TENDERNESS IN MOUTH AND THROAT WITH OR WITHOUT PRESENCE OF ULCERS  *URINARY PROBLEMS  *BOWEL PROBLEMS  UNUSUAL RASH Items with * indicate a potential emergency and should be followed up as soon as possible.  Feel free to call the clinic should you have any questions or concerns. The clinic phone number is [phone].  Please show the [place] at check-in to the Emergency Department and triage nurse.

## 2020-08-09 NOTE — Patient Instructions (Signed)
Implanted Port Insertion, Care After This sheet gives you information about how to care for yourself after your procedure. Your health care provider may also give you more specific instructions. If you have problems or questions, contact your health care provider. What can I expect after the procedure? After the procedure, it is common to have:  Discomfort at the port insertion site.  Bruising on the skin over the port. This should improve over 3-4 days. Follow these instructions at home: Port care  After your port is placed, you will get a manufacturer's information card. The card has information about your port. Keep this card with you at all times.  Take care of the port as told by your health care provider. Ask your health care provider if you or a family member can get training for taking care of the port at home. A home health care nurse may also take care of the port.  Make sure to remember what type of port you have. Incision care  Follow instructions from your health care provider about how to take care of your port insertion site. Make sure you: ? Wash your hands with soap and water before and after you change your bandage (dressing). If soap and water are not available, use hand sanitizer. ? Change your dressing as told by your health care provider. ? Leave stitches (sutures), skin glue, or adhesive strips in place. These skin closures may need to stay in place for 2 weeks or longer. If adhesive strip edges start to loosen and curl up, you may trim the loose edges. Do not remove adhesive strips completely unless your health care provider tells you to do that.  Check your port insertion site every day for signs of infection. Check for: ? Redness, swelling, or pain. ? Fluid or blood. ? Warmth. ? Pus or a bad smell.      Activity  Return to your normal activities as told by your health care provider. Ask your health care provider what activities are safe for you.  Do not  lift anything that is heavier than 10 lb (4.5 kg), or the limit that you are told, until your health care provider says that it is safe. General instructions  Take over-the-counter and prescription medicines only as told by your health care provider.  Do not take baths, swim, or use a hot tub until your health care provider approves. Ask your health care provider if you may take showers. You may only be allowed to take sponge baths.  Do not drive for 24 hours if you were given a sedative during your procedure.  Wear a medical alert bracelet in case of an emergency. This will tell any health care providers that you have a port.  Keep all follow-up visits as told by your health care provider. This is important. Contact a health care provider if:  You cannot flush your port with saline as directed, or you cannot draw blood from the port.  You have a fever or chills.  You have redness, swelling, or pain around your port insertion site.  You have fluid or blood coming from your port insertion site.  Your port insertion site feels warm to the touch.  You have pus or a bad smell coming from the port insertion site. Get help right away if:  You have chest pain or shortness of breath.  You have bleeding from your port that you cannot control. Summary  Take care of the port as told by your   health care provider. Keep the manufacturer's information card with you at all times.  Change your dressing as told by your health care provider.  Contact a health care provider if you have a fever or chills or if you have redness, swelling, or pain around your port insertion site.  Keep all follow-up visits as told by your health care provider. This information is not intended to replace advice given to you by your health care provider. Make sure you discuss any questions you have with your health care provider. Document Revised: 02/10/2018 Document Reviewed: 02/10/2018 Elsevier Patient Education   2021 [employer]  

## 2020-08-21 ENCOUNTER — Other Ambulatory Visit: Payer: Self-pay

## 2020-08-21 ENCOUNTER — Telehealth: Payer: Self-pay

## 2020-08-21 ENCOUNTER — Ambulatory Visit (INDEPENDENT_AMBULATORY_CARE_PROVIDER_SITE_OTHER): Payer: Medicare Other

## 2020-08-21 VITALS — BP 140/62 | HR 87 | Temp 98.6°F | Resp 16 | Ht 60.0 in | Wt 180.6 lb

## 2020-08-21 DIAGNOSIS — Z0000 Encounter for general adult medical examination without abnormal findings: Secondary | ICD-10-CM

## 2020-08-21 DIAGNOSIS — Z780 Asymptomatic menopausal state: Secondary | ICD-10-CM

## 2020-08-21 NOTE — Telephone Encounter (Signed)
During Medicare Wellness exam, patient states the Gabapentin is not helping her Neuropathy. She wants to know if there is anything else she can try?

## 2020-08-21 NOTE — Telephone Encounter (Signed)
See telephone note for full note

## 2020-08-21 NOTE — Telephone Encounter (Signed)
Called patient to inform her that per Dr. Birdie Riddle would like to discuss her gabapentin when she comes in for her app. Pt did not answer. vm was left.

## 2020-08-21 NOTE — Progress Notes (Signed)
Subjective:   Tracy [name] is a 75 y.o. female who presents for Medicare Annual (Subsequent) preventive examination.  Review of Systems     Cardiac Risk Factors include: advanced age (>22mn, >>93women);diabetes mellitus;hypertension;dyslipidemia;sedentary lifestyle;obesity (BMI >30kg/m2)     Objective:    Today's Vitals   08/21/20 1411 08/21/20 1416  BP: 140/62   Pulse: 87   Resp: 16   Temp: 98.6 F (37 C)   TempSrc: Oral   SpO2: 98%   Weight: 180 lb 9.6 oz (81.9 kg)   Height: 5' (1.524 m)   PainSc:  1    Body mass index is 35.27 kg/m.  Advanced Directives 08/21/2020 08/09/2020 08/04/2020 07/27/2020 07/14/2020 07/05/2020 06/30/2020  Does Patient Have a Medical Advance Directive? _0  No No  Would patient like information on creating a medical advance directive? No - Patient declined No - Patient declined No - Patient declined No - Patient declined No - Patient declined No - Patient declined No - Patient declined    Current Medications (verified) Outpatient Encounter Medications as of 08/21/2020  Medication Sig  . Accu-Chek Softclix Lancets lancets Use as instructed to check sugars 1-2 times daily.  .Marland Kitchenacyclovir (ZOVIRAX) 400 MG tablet Take 1 tablet (400 mg total) by mouth 2 (two) times daily.  .Marland Kitchenalbuterol (VENTOLIN HFA) 108 (90 Base) MCG/ACT inhaler Inhale 2 puffs into the lungs every 6 (six) hours as needed for wheezing or shortness of breath.  .Marland Kitchenaspirin 81 MG tablet Take 81 mg by mouth daily.  .Marland Kitchenatorvastatin (LIPITOR) 10 MG tablet TAKE 1 TABLET BY MOUTH  DAILY  . azithromycin (ZITHROMAX Z-PAK) 250 MG tablet 2 tablets day 1 and then 1 tablet days 2 through 5  . dexamethasone (DECADRON) 4 MG tablet Take 3 tabs with breakfast the day after each treatment.  . ergocalciferol (VITAMIN D2) 1.25 MG (50000 UT) capsule Take 1 capsule (50,000 Units total) by mouth once a week.  . ferrous sulfate 325 (65 FE) MG tablet Take 1 tablet (325 mg total) by mouth daily with  breakfast.  . fluticasone (FLOVENT HFA) 110 MCG/ACT inhaler Inhale 2 puffs into the lungs in the morning and at bedtime.  . gabapentin (NEURONTIN) 300 MG capsule TAKE 1 CAPSULE BY MOUTH 3  TIMES DAILY  . glucose blood (ACCU-CHEK GUIDE) test strip Use as instructed to check sugars 1-2 times daily.  .Marland Kitchenlidocaine-prilocaine (EMLA) cream Apply 1 application topically as needed.  . loratadine (CLARITIN) 10 MG tablet Take 10 mg by mouth daily.  .Marland KitchenLORazepam (ATIVAN) 0.5 MG tablet Take 1 tablet (0.5 mg total) by mouth every 6 (six) hours as needed (Nausea or vomiting).  .Marland Kitchenlosartan (COZAAR) 100 MG tablet TAKE 1 TABLET BY MOUTH  DAILY  . MELATONIN PO Take by mouth.   . metFORMIN (GLUCOPHAGE) 500 MG tablet TAKE 1 TABLET(500 MG) BY MOUTH TWICE DAILY WITH A MEAL  . mometasone (NASONEX) 50 MCG/ACT nasal spray Place 2 sprays into the nose daily.  . ondansetron (ZOFRAN) 8 MG tablet Take 1 tablet (8 mg total) by mouth 2 (two) times daily as needed (Nausea or vomiting).  . pantoprazole (PROTONIX) 40 MG tablet TAKE 1 TABLET BY MOUTH  TWICE DAILY  . potassium chloride SA (KLOR-CON) 20 MEQ tablet Take 1 tablet (20 mEq total) by mouth 2 (two) times daily.  . prochlorperazine (COMPAZINE) 10 MG tablet Take 1 tablet (10 mg total) by mouth every 6 (six) hours as needed (Nausea or vomiting).  .Marland Kitchen  tiZANidine (ZANAFLEX) 4 MG tablet TAKE 1 TABLET(4 MG) BY MOUTH AT BEDTIME  . traZODone (DESYREL) 100 MG tablet TAKE 1 TABLET BY MOUTH AT  BEDTIME  . HYDROcodone-acetaminophen (NORCO) 5-325 MG tablet Take 1 tablet by mouth every 6 (six) hours as needed for moderate pain or severe pain. (Patient not taking: No sig reported)  . sertraline (ZOLOFT) 50 MG tablet TAKE 1 TABLET(50 MG) BY MOUTH DAILY (Patient not taking: No sig reported)   No facility-administered encounter medications on file as of 08/21/2020.    Allergies (verified) Bacitracin-neomycin-polymyxin  [neomycin-bacitracin zn-polymyx], Nsaids, Tape, Ambien [zolpidem  tartrate], Amoxicillin, Contrast media [iodinated diagnostic agents], Latex, and Prednisone   History: Past Medical History:  Diagnosis Date  . Anxiety   . Arthritis   . Breast cancer ([place]) 06/13/15  . Cancer ([place]) 2000   breast cancer  . Chronic bronchitis ([place])   . Chronic bronchitis ([place])   . Hyperlipidemia   . Hypertension   . Myocardial infarction ([place]) 2001  . Personal history of radiation therapy   . Restless leg   . Stroke [facility]) 2004   TIA, no deficits   Past Surgical History:  Procedure Laterality Date  . ABDOMINAL HYSTERECTOMY  1985  . BREAST LUMPECTOMY Left 2000   radiation and chemo  . BREAST LUMPECTOMY Right 2016   radiation  . BREAST SURGERY  2001   lt breast lumpectomy  . IR IMAGING GUIDED PORT INSERTION  04/25/2020  . RADIOACTIVE SEED GUIDED PARTIAL MASTECTOMY WITH AXILLARY SENTINEL LYMPH NODE BIOPSY Right 07/07/2015   Procedure: RIGHT RADIOACTIVE SEED GUIDED PARTIAL MASTECTOMY WITH AXILLARY SENTINEL LYMPH NODE BIOPSY;  Surgeon: Autumn Messing III, MD;  Location: [facility];  Service: General;  Laterality: Right;  . SMALL INTESTINE SURGERY    . TUBAL LIGATION     Family History  Problem Relation Age of Onset  . Diabetes Father   . Lung cancer Sister        dx. <50; former smoker  . Diabetes Brother   . Diabetes Paternal Aunt   . Stroke Maternal Grandmother   . Diabetes Paternal Grandmother   . Emphysema Mother 40       smoker  . Diabetes Brother   . Brain cancer Brother 76       unknown tumor type  . Cancer Daughter 51       neck cancer  . Other Daughter        hysterectomy for unspecified reason  . Breast cancer Cousin   . Cancer Cousin        unspecified type  . Breast cancer Other        triple negative breast cancer in her 58s  . Cancer Daughter   . Colon polyps Neg Hx   . Esophageal cancer Neg Hx   . Gallbladder disease Neg Hx    Social History   Socioeconomic History  . Marital status: Divorced    Spouse name: Not on  file  . Number of children: 7  . Years of education: Not on file  . Highest education level: Not on file  Occupational History  . Occupation: retired  Tobacco Use  . Smoking status: Former Smoker    Packs/day: 1.00    Years: 20.00    Pack years: 20.00    Types: Cigarettes    Quit date: 07/30/2011    Years since quitting: 9.0  . Smokeless tobacco: Never Used  . Tobacco comment: Quit >4 years ago; 1 ppd for about  5/20 years (remaining was less)  Vaping Use  . Vaping Use: Former  Substance and Sexual Activity  . Alcohol use: No    Alcohol/week: 0.0 standard drinks  . Drug use: No  . Sexual activity: Not Currently  Other Topics Concern  . Not on file  Social History Narrative   Lives alone.  Retired.  Education:  11th grade GED.  Children:  7 (one here).    Social Determinants of Health   Financial Resource Strain: Low Risk   . Difficulty of Paying Living Expenses: Not hard at all  Food Insecurity: No Food Insecurity  . Worried About Charity fundraiser in the Last Year: Never true  . Ran Out of Food in the Last Year: Never true  Transportation Needs: No Transportation Needs  . Lack of Transportation (Medical): No  . Lack of Transportation (Non-Medical): No  Physical Activity: Inactive  . Days of Exercise per Week: 0 days  . Minutes of Exercise per Session: 0 min  Stress: No Stress Concern Present  . Feeling of Stress : Not at all  Social Connections: Moderately Isolated  . Frequency of Communication with Friends and Family: More than three times a week  . Frequency of Social Gatherings with Friends and Family: Once a week  . Attends Religious Services: 1 to 4 times per year  . Active Member of Clubs or Organizations: No  . Attends Archivist Meetings: Never  . Marital Status: Divorced    Tobacco Counseling Counseling given: Not Answered Comment: Quit >4 years ago; 1 ppd for about 5/20 years (remaining was less)   Clinical Intake:  Pre-visit preparation  completed: Yes  Pain : 0-10 Pain Score: 1  Pain Type: Chronic pain,Neuropathic pain Pain Location: Foot (ankles & hands) Pain Onset: More than a month ago Pain Frequency: Constant     Nutritional Status: BMI > 30  Obese Nutritional Risks: None Diabetes: Yes CBG done?: No Did pt. bring in CBG monitor from home?: No  How often do you need to have someone help you when you read instructions, pamphlets, or other written materials from your doctor or pharmacy?: 1 - Never  Diabetes:  Is the patient diabetic?  Yes  If diabetic, was a CBG obtained today?  No  Did the patient bring in their glucometer from home?  No  How often do you monitor your CBG's? occasionally.   Financial Strains and Diabetes Management:  Are you having any financial strains with the device, your supplies or your medication? No .  Does the patient want to be seen by Chronic Care Management for management of their diabetes?  No  Would the patient like to be referred to a Nutritionist or for Diabetic Management?  No   Diabetic Exams:  Diabetic Eye Exam: Completed 05/2020. Per patient. Results requested by patient to be sent to PCP.  Diabetic Foot Exam:  Pt has been advised about the importance in completing this exam. To be completed by PCP     Interpreter Needed?: No  Information entered by :: Caroleen Hamman LPN   Activities of Daily Living In your present state of health, do you have any difficulty performing the following activities: 08/21/2020 06/28/2020  Hearing? N N  Vision? N N  Difficulty concentrating or making decisions? Y N  Comment short term -  Walking or climbing stairs? N N  Dressing or bathing? N N  Doing errands, shopping? N N  Preparing Food and eating ? N -  Using the Toilet? N -  In the past six months, have you accidently leaked urine? N -  Do you have problems with loss of bowel control? N -  Managing your Medications? N -  Managing your Finances? N -  Housekeeping or  managing your Housekeeping? N -  Some recent data might be hidden    Patient Care Team: [name], MD as PCP - General (Family Medicine) [name], MD as Attending Physician (Physical Medicine and Rehabilitation) [name], MD as Consulting Physician (Orthopedic Surgery) [name], MD as Consulting Physician (Otolaryngology) [name], MD as Consulting Physician (Gastroenterology) [name], MD as Consulting Physician (General Surgery) [name], [facility] as Pharmacist (Pharmacist)  Indicate any recent Medical Services you may have received from other than [facility] providers in the past year (date may be approximate).     Assessment:   This is a routine wellness examination for [facility].  Hearing/Vision screen  Hearing Screening   _0  _1  _2  _3  _4  _5  _6  _7  _8   Right ear:           Left ear:           Comments: No issues  Vision Screening Comments: Wears glasses sometimes Last eye exam-05/2020-[facility]  Dietary issues and exercise activities discussed: Current Exercise Habits: The patient does not participate in regular exercise at present, Exercise limited by: None identified  Goals    . BP <140/90     CARE PLAN ENTRY (see longitudinal plan of care for additional care plan information)  Current Barriers:  . Current antihypertensive regimen: losartan 100 mg daily . Last practice recorded BP readings:  BP Readings from Last 3 Encounters:  11/03/19 117/61  08/20/19 140/79  07/15/19 (!) 145/72 .  Patient home BP readings are ranging: 267-124P systolic . Most recent eGFR/CrCl:  Lab Results  Component Value Date   EGFR 71 (L) 07/13/2015   Pharmacist Clinical Goal(s):  Marland Kitchen Over the next 90 days, patient will work with PharmD and providers to optimize antihypertensive regimen  Interventions: . Inter-disciplinary care team collaboration (see longitudinal plan of care) . Comprehensive medication review  performed; medication list updated in the electronic medical record.   Patient Self Care Activities:  . Patient will continue to check BP at home at least once weekly and using a cuff for the upper arm rather than wrist. Document, and provide at future appointments . Patient will focus on medication adherence by continuing with current medication management.   Initial goal documentation     . GERD: Minimize recurring symptoms     CARE PLAN ENTRY Current Barriers:  . Chronic Disease Management support, education, and care coordination needs related to  GERD/Acid Reflux o Current regimen: pantoprazole 40 mg daily  Pharmacist Clinical Goal(s):  Marland Kitchen Over next 90 days: Minimize recurring symptoms of acid reflux  Interventions: . Comprehensive medication review performed.  Patient Self Care Activities:  . Patient verbalizes understanding of plan to call with any questions. . Continue pantoprazole 40 mg daily  Initial goal documentation     . LDL Cholesterol <70     CARE PLAN ENTRY (see longitudinal plan of care for additional care plan information)  Current Barriers:  . Current antihyperlipidemic regimen: atorvastatin 10 mg daily     Component Value Date/Time   CHOL 147 08/20/2019 1044   TRIG 141.0 08/20/2019 1044   HDL 53.70 08/20/2019 1044   CHOLHDL 3 08/20/2019 1044   VLDL 28.2 08/20/2019 1044   LDLCALC  65 08/20/2019 1044   LDLDIRECT 154.7 04/05/2013 0906 .   Marland Kitchen ASCVD risk enhancing conditions: age >62, DM, HTN, CKD, CHF, current smoker . 10-year ASCVD risk score: 22.7%  Pharmacist Clinical Goal(s):  Marland Kitchen Over the next 90 days, patient will work with PharmD and providers towards optimized antihyperlipidemic therapy  Interventions: . Comprehensive medication review performed; medication list updated in electronic medical record.  Bertram Savin care team collaboration (see longitudinal plan of care)  Patient Self Care Activities:  . Patient will focus on medication  adherence by continuing current medication management  Initial goal documentation     . Patient Stated     Drink more water    . PharmD Care Plan     CARE PLAN ENTRY Current Barriers:  . Chronic Disease Management support, education, and care coordination needs related to Diabetes and Anxiety/Depression   Diabetes . Pharmacist Clinical Goal(s): o Over the next 90 days, patient will work with PharmD and providers to achieve A1c goal <8% . Current regimen:  o Metformin 500 mg twice daily  . Patient self care activities - Over the next 90 days, patient will: o Continue metformin 500 mg twice daily Anxiety/Depression . Pharmacist Clinical Goal(s) o Over the next 90 days, patient will work with PharmD and providers to minimize anxiety related symptoms. . Current regimen:  o Tapering off of sertraline: currently half of the 50 mg (25 mg) until tablets are gone . Interventions: o Continue current taper of sertraline . Patient self care activities - Over the next 90 days, patient will: o Continue current taper of sertraline at half a tablet (25 mg) until gone  Follow-up visit goal documentation.       Depression Screen PHQ 2/9 Scores 08/21/2020 06/28/2020 06/28/2020 02/08/2020 01/13/2020 08/20/2019 07/15/2019  PHQ - 2 Score 0 0 0 1 0 0 0  PHQ- 9 Score - 0 0 - 0 0 -    Fall Risk Fall Risk  08/21/2020 06/28/2020 01/13/2020 08/20/2019 08/20/2019  Falls in the past year? 1 0 0 0 0  Number falls in past yr: 1 0 0 0 0  Injury with Fall? 0 0 0 0 0  Comment - - - - -  Risk for fall due to : History of fall(s) No Fall Risks - - -  Risk for fall due to: Comment - - - - -  Follow up Falls prevention discussed - Falls evaluation completed Falls evaluation completed Falls evaluation completed    [place]:  Any stairs in or around the home? Yes  If so, are there any without handrails? No  Home free of loose throw rugs in walkways, pet beds, electrical cords,  etc? Yes  Adequate lighting in your home to reduce risk of falls? Yes   ASSISTIVE DEVICES UTILIZED TO PREVENT FALLS:  Life alert? No  Use of a cane, walker or w/c? Yes  Grab bars in the bathroom? Yes  Shower chair or bench in shower? Yes  Elevated toilet seat or a handicapped toilet? No   TIMED UP AND GO:  Was the test performed? Yes .  Length of time to ambulate 10 feet: 10 sec.   Gait slow and steady without use of assistive device  Cognitive Function:Normal cognitive status assessed by direct observation by this Nurse Health Advisor. No abnormalities found.  Patient states she sometimes forgets where she places things.        Immunizations Immunization History  Administered Date(s) Administered  .  Fluad Quad(high Dose 65+) 05/31/2020  . Influenza Split 04/28/2010, 06/24/2011  . Influenza Whole 04/22/2013  . Influenza, High Dose Seasonal PF 05/30/2016, 05/20/2018, 04/09/2019  . Influenza,inj,Quad PF,6+ Mos 04/29/2015, 05/16/2017  . Influenza-Unspecified 05/03/2014  . Moderna Sars-Covid-2 Vaccination 09/27/2019, 10/25/2019, 06/05/2020  . Pneumococcal Conjugate-13 06/01/2015  . Pneumococcal Polysaccharide-23 05/10/2014  . Td 04/05/2013  . Zoster 05/01/2010    TDAP status: Up to date  Flu Vaccine status: Up to date  Pneumococcal vaccine status: Up to date  Covid-19 vaccine status: Completed vaccines  Qualifies for Shingles Vaccine? Yes   Zostavax completed Yes   Shingrix Completed?: No.    Education has been provided regarding the importance of this vaccine. Patient has been advised to call insurance company to determine out of pocket expense if they have not yet received this vaccine. Advised may also receive vaccine at local pharmacy or Health Dept. Verbalized acceptance and understanding.  Screening Tests Health Maintenance  Topic Date Due  . HEMOGLOBIN A1C  07/14/2020  . FOOT EXAM  08/19/2020  . OPHTHALMOLOGY EXAM  10/31/2020 (Originally 05/19/2020)  .  COVID-19 Vaccine (4 - Booster for Moderna series) 12/03/2020  . TETANUS/TDAP  04/06/2023  . INFLUENZA VACCINE  Completed  . DEXA SCAN  Completed  . Hepatitis C Screening  Completed  . PNA vac Low Risk Adult  Completed    Health Maintenance  Health Maintenance Due  Topic Date Due  . HEMOGLOBIN A1C  07/14/2020  . FOOT EXAM  08/19/2020    Colorectal cancer screening: No longer required.   Mammogram status: Scheduled for 08/25/20  Bone Density status: Ordered today. Pt provided with contact info and advised to call to schedule appt.  Lung Cancer Screening: (Low Dose CT Chest recommended if Age 18-80 years, 30 pack-year currently smoking OR have quit w/in 15years.) does not qualify.     Additional Screening:  Hepatitis C Screening: Completed 02/18/2020  Vision Screening: Recommended annual ophthalmology exams for early detection of glaucoma and other disorders of the eye. Is the patient up to date with their annual eye exam?  Yes  Who is the provider or what is the name of the office in which the patient attends annual eye exams? [facility]    Dental Screening: Recommended annual dental exams for proper oral hygiene  Community Resource Referral / Chronic Care Management: CRR required this visit?  No   CCM required this visit?  No      Plan:     I have personally reviewed and noted the following in the patient's chart:   . Medical and social history . Use of alcohol, tobacco or illicit drugs  . Current medications and supplements . Functional ability and status . Nutritional status . Physical activity . Advanced directives . List of other physicians . Hospitalizations, surgeries, and ER visits in previous 12 months . Vitals . Screenings to include cognitive, depression, and falls . Referrals and appointments  In addition, I have reviewed and discussed with patient certain preventive protocols, quality metrics, and best practice recommendations. A written  personalized care plan for preventive services as well as general preventive health recommendations were provided to patient.     Marta Antu, LPN   [id number]/57/[id number]   Nurse Notes: Patient states Gabapentin is not helping her Neuropathy. Message sent to PCP & patient has an upcoming appt with PCP.

## 2020-08-21 NOTE — Patient Instructions (Signed)
Tracy [name] , Thank you for taking time to come for your Medicare Wellness Visit. I appreciate your ongoing commitment to your health goals. Please review the following plan we discussed and let me know if I can assist you in the future.   Screening recommendations/referrals: Colonoscopy: Completed 05/16/2020-No longer required after age 77. Mammogram: Scheduled for 08/25/20 Bone Density: Ordered today. Someone will be calling you to schedule. Recommended yearly ophthalmology/optometry visit for glaucoma screening and checkup Recommended yearly dental visit for hygiene and checkup  Vaccinations: Influenza vaccine: Up to date Pneumococcal vaccine: Completed vacines Tdap vaccine: Up to date-Due 04/06/2023 Shingles vaccine: Patient was told by oncologist not to get vaccine  Covid-19:Completed vaccines  Advanced directives: Information given today  Conditions/risks identified: See problem list  Next appointment: Follow up in one year for your annual wellness visit    Preventive Care 65 Years and Older, Female Preventive care refers to lifestyle choices and visits with your health care provider that can promote health and wellness. What does preventive care include?  A yearly physical exam. This is also called an annual well check.  Dental exams once or twice a year.  Routine eye exams. Ask your health care provider how often you should have your eyes checked.  Personal lifestyle choices, including:  Daily care of your teeth and gums.  Regular physical activity.  Eating a healthy diet.  Avoiding tobacco and drug use.  Limiting alcohol use.  Practicing safe sex.  Taking low-dose aspirin every day.  Taking vitamin and mineral supplements as recommended by your health care provider. What happens during an annual well check? The services and screenings done by your health care provider during your annual well check will depend on your age, overall health, lifestyle risk factors,  and family history of disease. Counseling  Your health care provider may ask you questions about your:  Alcohol use.  Tobacco use.  Drug use.  Emotional well-being.  Home and relationship well-being.  Sexual activity.  Eating habits.  History of falls.  Memory and ability to understand (cognition).  Work and work Statistician.  Reproductive health. Screening  You may have the following tests or measurements:  Height, weight, and BMI.  Blood pressure.  Lipid and cholesterol levels. These may be checked every 5 years, or more frequently if you are over 65 years old.  Skin check.  Lung cancer screening. You may have this screening every year starting at age 81 if you have a 30-pack-year history of smoking and currently smoke or have quit within the past 15 years.  Fecal occult blood test (FOBT) of the stool. You may have this test every year starting at age 14.  Flexible sigmoidoscopy or colonoscopy. You may have a sigmoidoscopy every 5 years or a colonoscopy every 10 years starting at age 57.  Hepatitis C blood test.  Hepatitis B blood test.  Sexually transmitted disease (STD) testing.  Diabetes screening. This is done by checking your blood sugar (glucose) after you have not eaten for a while (fasting). You may have this done every 1-3 years.  Bone density scan. This is done to screen for osteoporosis. You may have this done starting at age 63.  Mammogram. This may be done every 1-2 years. Talk to your health care provider about how often you should have regular mammograms. Talk with your health care provider about your test results, treatment options, and if necessary, the need for more tests. Vaccines  Your health care provider may recommend certain vaccines,  such as:  Influenza vaccine. This is recommended every year.  Tetanus, diphtheria, and acellular pertussis (Tdap, Td) vaccine. You may need a Td booster every 10 years.  Zoster vaccine. You may need  this after age 3.  Pneumococcal 13-valent conjugate (PCV13) vaccine. One dose is recommended after age 9.  Pneumococcal polysaccharide (PPSV23) vaccine. One dose is recommended after age 62. Talk to your health care provider about which screenings and vaccines you need and how often you need them. This information is not intended to replace advice given to you by your health care provider. Make sure you discuss any questions you have with your health care provider. Document Released: 08/11/2015 Document Revised: 04/03/2016 Document Reviewed: 05/16/2015 Elsevier Interactive Patient Education  2017 [employer] Prevention in the Home Falls can cause injuries. They can happen to people of all ages. There are many things you can do to make your home safe and to help prevent falls. What can I do on the outside of my home?  Regularly fix the edges of walkways and driveways and fix any cracks.  Remove anything that might make you trip as you walk through a door, such as a raised step or threshold.  Trim any bushes or trees on the path to your home.  Use bright outdoor lighting.  Clear any walking paths of anything that might make someone trip, such as rocks or tools.  Regularly check to see if handrails are loose or broken. Make sure that both sides of any steps have handrails.  Any raised decks and porches should have guardrails on the edges.  Have any leaves, snow, or ice cleared regularly.  Use sand or salt on walking paths during winter.  Clean up any spills in your garage right away. This includes oil or grease spills. What can I do in the bathroom?  Use night lights.  Install grab bars by the toilet and in the tub and shower. Do not use towel bars as grab bars.  Use non-skid mats or decals in the tub or shower.  If you need to sit down in the shower, use a plastic, non-slip stool.  Keep the floor dry. Clean up any water that spills on the floor as soon as it  happens.  Remove soap buildup in the tub or shower regularly.  Attach bath mats securely with double-sided non-slip rug tape.  Do not have throw rugs and other things on the floor that can make you trip. What can I do in the bedroom?  Use night lights.  Make sure that you have a light by your bed that is easy to reach.  Do not use any sheets or blankets that are too big for your bed. They should not hang down onto the floor.  Have a firm chair that has side arms. You can use this for support while you get dressed.  Do not have throw rugs and other things on the floor that can make you trip. What can I do in the kitchen?  Clean up any spills right away.  Avoid walking on wet floors.  Keep items that you use a lot in easy-to-reach places.  If you need to reach something above you, use a strong step stool that has a grab bar.  Keep electrical cords out of the way.  Do not use floor polish or wax that makes floors slippery. If you must use wax, use non-skid floor wax.  Do not have throw rugs and other things on  the floor that can make you trip. What can I do with my stairs?  Do not leave any items on the stairs.  Make sure that there are handrails on both sides of the stairs and use them. Fix handrails that are broken or loose. Make sure that handrails are as long as the stairways.  Check any carpeting to make sure that it is firmly attached to the stairs. Fix any carpet that is loose or worn.  Avoid having throw rugs at the top or bottom of the stairs. If you do have throw rugs, attach them to the floor with carpet tape.  Make sure that you have a light switch at the top of the stairs and the bottom of the stairs. If you do not have them, ask someone to add them for you. What else can I do to help prevent falls?  Wear shoes that:  Do not have high heels.  Have rubber bottoms.  Are comfortable and fit you well.  Are closed at the toe. Do not wear sandals.  If you  use a stepladder:  Make sure that it is fully opened. Do not climb a closed stepladder.  Make sure that both sides of the stepladder are locked into place.  Ask someone to hold it for you, if possible.  Clearly mark and make sure that you can see:  Any grab bars or handrails.  First and last steps.  Where the edge of each step is.  Use tools that help you move around (mobility aids) if they are needed. These include:  Canes.  Walkers.  Scooters.  Crutches.  Turn on the lights when you go into a dark area. Replace any light bulbs as soon as they burn out.  Set up your furniture so you have a clear path. Avoid moving your furniture around.  If any of your floors are uneven, fix them.  If there are any pets around you, be aware of where they are.  Review your medicines with your doctor. Some medicines can make you feel dizzy. This can increase your chance of falling. Ask your doctor what other things that you can do to help prevent falls. This information is not intended to replace advice given to you by your health care provider. Make sure you discuss any questions you have with your health care provider. Document Released: 05/11/2009 Document Revised: 12/21/2015 Document Reviewed: 08/19/2014 Elsevier Interactive Patient Education  2017 [employer].

## 2020-08-21 NOTE — Telephone Encounter (Signed)
Will address this w/ pt at upcoming visit as we have to review how things are going and if any other changes have been made recently.

## 2020-08-23 ENCOUNTER — Encounter: Payer: Self-pay | Admitting: Internal Medicine

## 2020-08-23 DIAGNOSIS — K5520 Angiodysplasia of colon without hemorrhage: Secondary | ICD-10-CM

## 2020-08-23 HISTORY — DX: Angiodysplasia of colon without hemorrhage: K55.20

## 2020-08-24 ENCOUNTER — Inpatient Hospital Stay: Payer: Medicare Other

## 2020-08-24 ENCOUNTER — Other Ambulatory Visit: Payer: Self-pay

## 2020-08-24 ENCOUNTER — Other Ambulatory Visit: Payer: Self-pay | Admitting: Hematology

## 2020-08-24 VITALS — BP 135/52 | HR 80 | Temp 98.3°F | Resp 17

## 2020-08-24 DIAGNOSIS — Z853 Personal history of malignant neoplasm of breast: Secondary | ICD-10-CM | POA: Diagnosis not present

## 2020-08-24 DIAGNOSIS — Z87891 Personal history of nicotine dependence: Secondary | ICD-10-CM | POA: Diagnosis not present

## 2020-08-24 DIAGNOSIS — Z7189 Other specified counseling: Secondary | ICD-10-CM

## 2020-08-24 DIAGNOSIS — Z923 Personal history of irradiation: Secondary | ICD-10-CM | POA: Diagnosis not present

## 2020-08-24 DIAGNOSIS — C9000 Multiple myeloma not having achieved remission: Secondary | ICD-10-CM

## 2020-08-24 DIAGNOSIS — E1122 Type 2 diabetes mellitus with diabetic chronic kidney disease: Secondary | ICD-10-CM | POA: Diagnosis not present

## 2020-08-24 DIAGNOSIS — I252 Old myocardial infarction: Secondary | ICD-10-CM | POA: Diagnosis not present

## 2020-08-24 DIAGNOSIS — Z8673 Personal history of transient ischemic attack (TIA), and cerebral infarction without residual deficits: Secondary | ICD-10-CM | POA: Diagnosis not present

## 2020-08-24 DIAGNOSIS — D509 Iron deficiency anemia, unspecified: Secondary | ICD-10-CM

## 2020-08-24 DIAGNOSIS — N189 Chronic kidney disease, unspecified: Secondary | ICD-10-CM | POA: Diagnosis not present

## 2020-08-24 DIAGNOSIS — E1140 Type 2 diabetes mellitus with diabetic neuropathy, unspecified: Secondary | ICD-10-CM | POA: Diagnosis not present

## 2020-08-24 DIAGNOSIS — Z5112 Encounter for antineoplastic immunotherapy: Secondary | ICD-10-CM | POA: Diagnosis not present

## 2020-08-24 DIAGNOSIS — Z95828 Presence of other vascular implants and grafts: Secondary | ICD-10-CM

## 2020-08-24 LAB — CMP (CANCER CENTER ONLY)
ALT: 11 U/L (ref 0–44)
AST: 14 U/L — ABNORMAL LOW (ref 15–41)
Albumin: 3.8 g/dL (ref 3.5–5.0)
Alkaline Phosphatase: 73 U/L (ref 38–126)
Anion gap: 8 (ref 5–15)
BUN: 19 mg/dL (ref 8–23)
CO2: 22 mmol/L (ref 22–32)
Calcium: 8.8 mg/dL — ABNORMAL LOW (ref 8.9–10.3)
Chloride: 109 mmol/L (ref 98–111)
Creatinine: 1.49 mg/dL — ABNORMAL HIGH (ref 0.44–1.00)
GFR, Estimated: 36 mL/min — ABNORMAL LOW (ref >60)
Glucose, Bld: 89 mg/dL (ref 70–99)
Potassium: 4.8 mmol/L (ref 3.5–5.1)
Sodium: 139 mmol/L (ref 135–145)
Total Bilirubin: 0.4 mg/dL (ref 0.3–1.2)
Total Protein: 6.8 g/dL (ref 6.5–8.1)

## 2020-08-24 LAB — FERRITIN: Ferritin: 227 ng/mL (ref 11–307)

## 2020-08-24 LAB — CBC WITH DIFFERENTIAL/PLATELET
Abs Immature Granulocytes: 0.02 10*3/uL (ref 0.00–0.07)
Basophils Absolute: 0 10*3/uL (ref 0.0–0.1)
Basophils Relative: 0 %
Eosinophils Absolute: 0 10*3/uL (ref 0.0–0.5)
Eosinophils Relative: 1 %
HCT: 26.3 % — ABNORMAL LOW (ref 36.0–46.0)
Hemoglobin: 9.1 g/dL — ABNORMAL LOW (ref 12.0–15.0)
Immature Granulocytes: 0 %
Lymphocytes Relative: 26 %
Lymphs Abs: 1.2 10*3/uL (ref 0.7–4.0)
MCH: 32.9 pg (ref 26.0–34.0)
MCHC: 34.6 g/dL (ref 30.0–36.0)
MCV: 94.9 fL (ref 80.0–100.0)
Monocytes Absolute: 0.3 10*3/uL (ref 0.1–1.0)
Monocytes Relative: 7 %
Neutro Abs: 3.1 10*3/uL (ref 1.7–7.7)
Neutrophils Relative %: 66 %
Platelets: 245 10*3/uL (ref 150–400)
RBC: 2.77 MIL/uL — ABNORMAL LOW (ref 3.87–5.11)
RDW: 15.2 % (ref 11.5–15.5)
WBC: 4.7 10*3/uL (ref 4.0–10.5)
nRBC: 0 % (ref 0.0–0.2)

## 2020-08-24 LAB — IRON AND TIBC
Iron: 108 ug/dL (ref 41–142)
Saturation Ratios: 32 % (ref 21–57)
TIBC: 334 ug/dL (ref 236–444)
UIBC: 226 ug/dL (ref 120–384)

## 2020-08-24 MED ORDER — ONDANSETRON HCL 4 MG/2ML IJ SOLN
INTRAMUSCULAR | Status: AC
  Filled 2020-08-24: qty 4

## 2020-08-24 MED ORDER — ACETAMINOPHEN 325 MG PO TABS
ORAL_TABLET | ORAL | Status: AC
  Filled 2020-08-24: qty 2

## 2020-08-24 MED ORDER — DIPHENHYDRAMINE HCL 25 MG PO CAPS
ORAL_CAPSULE | ORAL | Status: AC
  Filled 2020-08-24: qty 2

## 2020-08-24 MED ORDER — DEXTROSE 5 % IV SOLN
56.0000 mg/m2 | Freq: Once | INTRAVENOUS | Status: AC
  Administered 2020-08-24: 110 mg via INTRAVENOUS
  Filled 2020-08-24: qty 30

## 2020-08-24 MED ORDER — ZOLEDRONIC ACID 4 MG/100ML IV SOLN
4.0000 mg | Freq: Once | INTRAVENOUS | Status: AC
  Administered 2020-08-24: 4 mg via INTRAVENOUS

## 2020-08-24 MED ORDER — ACETAMINOPHEN 325 MG PO TABS
650.0000 mg | ORAL_TABLET | Freq: Once | ORAL | Status: AC
  Administered 2020-08-24: 650 mg via ORAL

## 2020-08-24 MED ORDER — SODIUM CHLORIDE 0.9 % IV SOLN
16.0000 mg/kg | Freq: Once | INTRAVENOUS | Status: AC
  Administered 2020-08-24: 1400 mg via INTRAVENOUS
  Filled 2020-08-24: qty 60

## 2020-08-24 MED ORDER — SODIUM CHLORIDE 0.9% FLUSH
10.0000 mL | INTRAVENOUS | Status: DC | PRN
Start: 2020-08-24 — End: 2020-08-24
  Administered 2020-08-24: 10 mL
  Filled 2020-08-24: qty 10

## 2020-08-24 MED ORDER — DIPHENHYDRAMINE HCL 25 MG PO CAPS
50.0000 mg | ORAL_CAPSULE | Freq: Once | ORAL | Status: AC
  Administered 2020-08-24: 50 mg via ORAL

## 2020-08-24 MED ORDER — SODIUM CHLORIDE 0.9 % IV SOLN
Freq: Once | INTRAVENOUS | Status: DC
  Filled 2020-08-24: qty 250

## 2020-08-24 MED ORDER — ONDANSETRON HCL 4 MG/2ML IJ SOLN
8.0000 mg | Freq: Once | INTRAMUSCULAR | Status: AC
  Administered 2020-08-24: 8 mg via INTRAVENOUS

## 2020-08-24 MED ORDER — FAMOTIDINE IN NACL 20-0.9 MG/50ML-% IV SOLN
20.0000 mg | Freq: Once | INTRAVENOUS | Status: AC
  Administered 2020-08-24: 20 mg via INTRAVENOUS

## 2020-08-24 MED ORDER — HEPARIN SOD (PORK) LOCK FLUSH 100 UNIT/ML IV SOLN
500.0000 [IU] | Freq: Once | INTRAVENOUS | Status: AC | PRN
  Administered 2020-08-24: 500 [IU]
  Filled 2020-08-24: qty 5

## 2020-08-24 MED ORDER — ZOLEDRONIC ACID 4 MG/100ML IV SOLN
INTRAVENOUS | Status: AC
  Filled 2020-08-24: qty 100

## 2020-08-24 MED ORDER — SODIUM CHLORIDE 0.9 % IV SOLN
20.0000 mg | Freq: Once | INTRAVENOUS | Status: AC
  Administered 2020-08-24: 20 mg via INTRAVENOUS
  Filled 2020-08-24: qty 20

## 2020-08-24 MED ORDER — FAMOTIDINE IN NACL 20-0.9 MG/50ML-% IV SOLN
INTRAVENOUS | Status: AC
  Filled 2020-08-24: qty 50

## 2020-08-24 MED ORDER — SODIUM CHLORIDE 0.9 % IV SOLN
Freq: Once | INTRAVENOUS | Status: AC
  Filled 2020-08-24: qty 250

## 2020-08-24 NOTE — Progress Notes (Signed)
Patient c/o nausea. Received verbal order for zofran 8mg  IV per Dr. Irene Limbo

## 2020-08-24 NOTE — Patient Instructions (Signed)
[facility] Discharge Instructions for Patients Receiving Chemotherapy  Today you received the following chemotherapy agents: Kyprolis, Daratumumab  To help prevent nausea and vomiting after your treatment, we encourage you to take your nausea medication as directed.   If you develop nausea and vomiting that is not controlled by your nausea medication, call the clinic.   BELOW ARE SYMPTOMS THAT SHOULD BE REPORTED IMMEDIATELY:  *FEVER GREATER THAN 100.5 F  *CHILLS WITH OR WITHOUT FEVER  NAUSEA AND VOMITING THAT IS NOT CONTROLLED WITH YOUR NAUSEA MEDICATION  *UNUSUAL SHORTNESS OF BREATH  *UNUSUAL BRUISING OR BLEEDING  TENDERNESS IN MOUTH AND THROAT WITH OR WITHOUT PRESENCE OF ULCERS  *URINARY PROBLEMS  *BOWEL PROBLEMS  UNUSUAL RASH Items with * indicate a potential emergency and should be followed up as soon as possible.  Feel free to call the clinic should you have any questions or concerns. The clinic phone number is [phone].  Please show the [facility] at check-in to the Emergency Department and triage nurse.

## 2020-08-25 ENCOUNTER — Ambulatory Visit
Admission: RE | Admit: 2020-08-25 | Discharge: 2020-08-25 | Disposition: A | Discharge status: Home / Self Care | Payer: Medicare Other | Source: Ambulatory Visit | Attending: Hematology and Oncology | Admitting: Hematology and Oncology

## 2020-08-25 ENCOUNTER — Telehealth: Payer: Self-pay

## 2020-08-25 DIAGNOSIS — Z853 Personal history of malignant neoplasm of breast: Secondary | ICD-10-CM

## 2020-08-25 DIAGNOSIS — R922 Inconclusive mammogram: Secondary | ICD-10-CM | POA: Diagnosis not present

## 2020-08-25 NOTE — Chronic Care Management (AMB) (Signed)
Chronic Care Management Pharmacy Assistant   Name: Tracy [name]  MRN: [id number] DOB: 1946-02-27  Reason for Encounter: Disease State/ Hypertension Adherence Call    PCP : [name], MD  Allergies:   Allergies  Allergen Reactions  . Bacitracin-Neomycin-Polymyxin  [Neomycin-Bacitracin Zn-Polymyx] Swelling  . Nsaids Other (See Comments)    nosebleeds  . Tape Hives    Pt cannot tolerate bandaids, tape, or any other adhesives.   . Ambien [Zolpidem Tartrate]     Hives   . Amoxicillin     Rash   . Contrast Media [Iodinated Diagnostic Agents] Hives    Pt states hives with prior ct, was given benadryl to resolve  . Latex Swelling  . Prednisone     Pt tolerates Dexamethasone. Pt cannot recall what happens when she takes Prednisone    Medications: Outpatient Encounter Medications as of 08/25/2020  Medication Sig  . Accu-Chek Softclix Lancets lancets Use as instructed to check sugars 1-2 times daily.  Marland Kitchen acyclovir (ZOVIRAX) 400 MG tablet Take 1 tablet (400 mg total) by mouth 2 (two) times daily.  Marland Kitchen albuterol (VENTOLIN HFA) 108 (90 Base) MCG/ACT inhaler Inhale 2 puffs into the lungs every 6 (six) hours as needed for wheezing or shortness of breath.  Marland Kitchen aspirin 81 MG tablet Take 81 mg by mouth daily.  Marland Kitchen atorvastatin (LIPITOR) 10 MG tablet TAKE 1 TABLET BY MOUTH  DAILY  . azithromycin (ZITHROMAX Z-PAK) 250 MG tablet 2 tablets day 1 and then 1 tablet days 2 through 5  . dexamethasone (DECADRON) 4 MG tablet Take 3 tabs with breakfast the day after each treatment.  . ergocalciferol (VITAMIN D2) 1.25 MG (50000 UT) capsule Take 1 capsule (50,000 Units total) by mouth once a week.  . ferrous sulfate 325 (65 FE) MG tablet Take 1 tablet (325 mg total) by mouth daily with breakfast.  . fluticasone (FLOVENT HFA) 110 MCG/ACT inhaler Inhale 2 puffs into the lungs in the morning and at bedtime.  . gabapentin (NEURONTIN) 300 MG capsule TAKE 1 CAPSULE BY MOUTH 3  TIMES DAILY  . glucose  blood (ACCU-CHEK GUIDE) test strip Use as instructed to check sugars 1-2 times daily.  Marland Kitchen HYDROcodone-acetaminophen (NORCO) 5-325 MG tablet Take 1 tablet by mouth every 6 (six) hours as needed for moderate pain or severe pain. (Patient not taking: No sig reported)  . lidocaine-prilocaine (EMLA) cream Apply 1 application topically as needed.  . loratadine (CLARITIN) 10 MG tablet Take 10 mg by mouth daily.  Marland Kitchen LORazepam (ATIVAN) 0.5 MG tablet Take 1 tablet (0.5 mg total) by mouth every 6 (six) hours as needed (Nausea or vomiting).  Marland Kitchen losartan (COZAAR) 100 MG tablet TAKE 1 TABLET BY MOUTH  DAILY  . MELATONIN PO Take by mouth.   . metFORMIN (GLUCOPHAGE) 500 MG tablet TAKE 1 TABLET(500 MG) BY MOUTH TWICE DAILY WITH A MEAL  . mometasone (NASONEX) 50 MCG/ACT nasal spray Place 2 sprays into the nose daily.  . ondansetron (ZOFRAN) 8 MG tablet Take 1 tablet (8 mg total) by mouth 2 (two) times daily as needed (Nausea or vomiting).  . pantoprazole (PROTONIX) 40 MG tablet TAKE 1 TABLET BY MOUTH  TWICE DAILY  . potassium chloride SA (KLOR-CON) 20 MEQ tablet Take 1 tablet (20 mEq total) by mouth 2 (two) times daily.  . prochlorperazine (COMPAZINE) 10 MG tablet Take 1 tablet (10 mg total) by mouth every 6 (six) hours as needed (Nausea or vomiting).  . sertraline (ZOLOFT) 50 MG tablet  TAKE 1 TABLET(50 MG) BY MOUTH DAILY (Patient not taking: No sig reported)  . tiZANidine (ZANAFLEX) 4 MG tablet TAKE 1 TABLET(4 MG) BY MOUTH AT BEDTIME  . traZODone (DESYREL) 100 MG tablet TAKE 1 TABLET BY MOUTH AT  BEDTIME   No facility-administered encounter medications on file as of 08/25/2020.    Current Diagnosis: Patient Active Problem List   Diagnosis Date Noted  . Angiodysplasia of intestine 08/23/2020  . Port-A-Cath in place 08/04/2020  . Counseling regarding advance care planning and goals of care 02/07/2020  . Multiple myeloma ([place]) 01/25/2020  . Dizziness 10/12/2019  . Post-nasal drainage 10/12/2019  . Allergic  rhinitis 08/19/2018  . Type 2 diabetes mellitus with diabetic neuropathy, unspecified ([place]) 11/05/2017  . Encounter for long-term use of muscle relaxants 09/24/2017  . CAD in native artery 09/24/2017  . OSA (obstructive sleep apnea) 09/24/2017  . Snorings 09/24/2017  . Morbid obesity ([place]) 06/02/2017  . Depression 06/02/2017  . Iron deficiency anemia 09/23/2016  . Anemia of chronic disease 09/28/2015  . Genetic testing 08/21/2015  . History of left breast cancer 07/13/2015  . History of right breast cancer 06/20/2015  . Hearing loss due to cerumen impaction 12/29/2014  . Allergy to adhesive tape 05/19/2014  . Hyperlipidemia 12/06/2013  . GERD (gastroesophageal reflux disease) 12/06/2013  . Cervical disc disease 11/11/2013  . Osteopenia 05/25/2013  . Allergic asthma 12/24/2012  . Insomnia 04/02/2012  . Physical exam, annual 04/02/2012  . HTN (hypertension) 02/03/2012  . Vertigo, benign positional 02/03/2012  . Left groin pain 02/03/2012  . Hip pain 10/10/2011  . TIA (transient ischemic attack) 07/24/2011  . Allergic reaction 07/05/2011  . Osteoarthrosis involving lower leg 10/19/2010  . Disorder of bone and cartilage 05/01/2010  . Generalized anxiety disorder 05/01/2010  . Vitamin D deficiency 02/20/2010  . Unspecified chronic bronchitis ([place]) 08/24/2009  . Other lymphedema 10/29/2007    Reviewed chart prior to disease state call. Spoke with patient regarding BP  Recent Office Vitals: BP Readings from Last 3 Encounters:  08/24/20 (!) 135/52  08/21/20 140/62  08/09/20 (!) 145/64   Pulse Readings from Last 3 Encounters:  08/24/20 80  08/21/20 87  08/09/20 89    Wt Readings from Last 3 Encounters:  08/21/20 180 lb 9.6 oz (81.9 kg)  08/04/20 181 lb 14.4 oz (82.5 kg)  07/05/20 181 lb (82.1 kg)     Kidney Function Lab Results  Component Value Date/Time   CREATININE 1.49 (H) 08/24/2020 11:25 AM   CREATININE 1.15 (H) 08/09/2020 11:17 AM   CREATININE 0.9 07/13/2015  03:16 PM   CREATININE 0.95 05/10/2014 11:47 AM   GFR 53.39 (L) 01/19/2020 02:18 PM   GFRNONAA 36 (L) 08/24/2020 11:25 AM   GFRNONAA 61 05/10/2014 11:47 AM   GFRAA 41 (L) 04/21/2020 10:24 AM   GFRAA 70 05/10/2014 11:47 AM    BMP Latest Ref Rng & Units 08/24/2020 08/09/2020 08/04/2020  Glucose 70 - 99 mg/dL 89 82 175(H)  BUN 8 - 23 mg/dL '19 13 12  ' Creatinine 0.44 - 1.00 mg/dL 1.49(H) 1.15(H) 1.25(H)  Sodium 135 - 145 mmol/L 139 142 142  Potassium 3.5 - 5.1 mmol/L 4.8 4.0 3.9  Chloride 98 - 111 mmol/L 109 111 109  CO2 22 - 32 mmol/L 22 22 21(L)  Calcium 8.9 - 10.3 mg/dL 8.8(L) 8.2(L) 9.2    . Current antihypertensive regimen:  o Losartan 100 mg daily  . How often are you checking your Blood Pressure? Patient states she is not checking  her blood pressure at home at this time.  . Current home BP readings: n/a  . What recent interventions/DTPs have been made by any provider to improve Blood Pressure control since last [name] Visit: Patient states she is currently taking her medications as prescribed.  . Any recent hospitalizations or ED visits since last visit with [name]? No , patient has not had any recent hospitalizations or ED visits since her last visit with [name], Pharm.D., BCGP.  Marland Kitchen What diet changes have been made to improve Blood Pressure Control?  o Patient states she has not made any changes to her diet and does not follow any diet at this time.  . What exercise is being done to improve your Blood Pressure Control?  o Patient states she is unable to exercise at this time due to neuropathy in her feet.  Adherence Review: Is the patient currently on ACE/ARB medication? Yes Does the patient have >5 day gap between last estimated fill dates? No  What would you like me to pass along to [name], [place].D., BCGP for him to help you with?   Patient states she has a neuropathy in her feet that is causing her a great deal of pain. Patient states she has a scheduled appointment on  09/14/2020 with [name], MD. Patient is requesting a message to be sent to Dr. Birdie Riddle in hopes of getting an appointment sooner than this. Patient states she is in so much pain she is unable to sleep at night and is miserable.   April D Calhoun, [facility] Pharmacist Assistant [phone]   April D Calhoun, [facility] Pharmacist Assistant [phone]   Follow-Up:  Pharmacist Review

## 2020-08-28 ENCOUNTER — Telehealth: Payer: Self-pay

## 2020-08-28 ENCOUNTER — Telehealth: Payer: Self-pay | Admitting: Gastroenterology

## 2020-08-28 LAB — MULTIPLE MYELOMA PANEL, SERUM
Albumin SerPl Elph-Mcnc: 3.7 g/dL (ref 2.9–4.4)
Albumin/Glob SerPl: 1.4 (ref 0.7–1.7)
Alpha 1: 0.2 g/dL (ref 0.0–0.4)
Alpha2 Glob SerPl Elph-Mcnc: 1.2 g/dL — ABNORMAL HIGH (ref 0.4–1.0)
B-Globulin SerPl Elph-Mcnc: 1 g/dL (ref 0.7–1.3)
Gamma Glob SerPl Elph-Mcnc: 0.5 g/dL (ref 0.4–1.8)
Globulin, Total: 2.8 g/dL (ref 2.2–3.9)
IgA: 52 mg/dL — ABNORMAL LOW (ref 64–422)
IgG (Immunoglobin G), Serum: 489 mg/dL — ABNORMAL LOW (ref 586–1602)
IgM (Immunoglobulin M), Srm: 41 mg/dL (ref 26–217)
M Protein SerPl Elph-Mcnc: 0.3 g/dL — ABNORMAL HIGH
Total Protein ELP: 6.5 g/dL (ref 6.0–8.5)

## 2020-08-28 NOTE — Telephone Encounter (Signed)
-----   Message from April D Calhoun sent at 08/28/2020 12:13 PM EST ----- Patient states she has a neuropathy in her feet that is causing her a great deal of pain. Patient states she has a scheduled appointment on 09/14/2020 with [name], MD. Patient is requesting a message to be sent to Dr. Birdie Riddle in hopes of getting an appointment sooner than this. Patient states she is in so much pain she is unable to sleep at night and is miserable.

## 2020-08-28 NOTE — Telephone Encounter (Deleted)
-----   Message from April D Calhoun sent at 08/28/2020 12:13 PM EST ----- Patient states she has a neuropathy in her feet that is causing her a great deal of pain. Patient states she has a scheduled appointment on 09/14/2020 with [name], Katherine E, MD. Patient is requesting a message to be sent to Dr. [name] in hopes of getting an appointment sooner than this. Patient states she is in so much pain she is unable to sleep at night and is miserable. 

## 2020-08-28 NOTE — Telephone Encounter (Signed)
Called patient and gave capsule endo. results. Scheduled office visit with Dr. Ardis Hughs on 08/29/20

## 2020-08-28 NOTE — Telephone Encounter (Signed)
Can we work her in somewhere?  Even if it means using a same day.Marland KitchenMarland KitchenMarland Kitchen

## 2020-08-28 NOTE — Telephone Encounter (Signed)
Please let her know that I have received the report about her capsule endoscopy that was about 2 weeks ago. It shows 2 small nonbleeding AVMs in the upper mid intestine. It is definitely far from clear whether these are causing any of her anemia and therefore whether treating them will make any clinical difference. I would like to see her in the office at my first available appointment to discuss this further. Thanks

## 2020-08-28 NOTE — Telephone Encounter (Signed)
She is scheduled on 09/14/20 fir memory loss, I have made her a new appt for the Neuropathy pain.

## 2020-08-29 ENCOUNTER — Encounter: Payer: Self-pay | Admitting: Gastroenterology

## 2020-08-29 ENCOUNTER — Ambulatory Visit: Payer: Medicare Other | Admitting: Gastroenterology

## 2020-08-29 ENCOUNTER — Ambulatory Visit (INDEPENDENT_AMBULATORY_CARE_PROVIDER_SITE_OTHER): Payer: Medicare Other | Admitting: Family Medicine

## 2020-08-29 ENCOUNTER — Encounter: Payer: Self-pay | Admitting: Family Medicine

## 2020-08-29 ENCOUNTER — Other Ambulatory Visit: Payer: Self-pay

## 2020-08-29 VITALS — BP 134/64 | HR 72 | Ht 60.0 in | Wt 181.0 lb

## 2020-08-29 VITALS — BP 138/70 | HR 66 | Temp 97.3°F | Resp 16 | Ht 60.0 in | Wt 181.0 lb

## 2020-08-29 DIAGNOSIS — D649 Anemia, unspecified: Secondary | ICD-10-CM

## 2020-08-29 DIAGNOSIS — G629 Polyneuropathy, unspecified: Secondary | ICD-10-CM | POA: Diagnosis not present

## 2020-08-29 DIAGNOSIS — E1140 Type 2 diabetes mellitus with diabetic neuropathy, unspecified: Secondary | ICD-10-CM

## 2020-08-29 DIAGNOSIS — F32A Depression, unspecified: Secondary | ICD-10-CM

## 2020-08-29 LAB — B12 AND FOLATE PANEL
Folate: 9.3 ng/mL (ref >5.9)
Vitamin B-12: 287 pg/mL (ref 211–911)

## 2020-08-29 LAB — LIPID PANEL
Cholesterol: 184 mg/dL (ref 0–200)
HDL: 74 mg/dL (ref >39.00)
LDL Cholesterol: 82 mg/dL (ref 0–99)
NonHDL: 109.82
Total CHOL/HDL Ratio: 2
Triglycerides: 137 mg/dL (ref 0.0–149.0)
VLDL: 27.4 mg/dL (ref 0.0–40.0)

## 2020-08-29 LAB — HEMOGLOBIN A1C: Hgb A1c MFr Bld: 6.4 % (ref 4.6–6.5)

## 2020-08-29 LAB — TSH: TSH: 2.05 u[IU]/mL (ref 0.35–4.50)

## 2020-08-29 NOTE — Patient Instructions (Signed)
Follow up as scheduled for your physical We'll notify you of your lab results and make any changes if needed INCREASE the Gabapentin to 600mg  (2 pills) at night CONTINUE the Gabapentin 300mg  (1 pill) morning and midday Call with any questions or concerns Stay Safe!  Stay Healthy! You are in my prayers!

## 2020-08-29 NOTE — Progress Notes (Signed)
   Subjective:    Patient ID: Tracy [name], female    DOB: 02-Sep-1945, 75 y.o.   MRN: [id number]  HPI Neuropathy- pt reports tingling in both hands and both feet.  'it feels like I'm walking on gravel'.  Sxs are worse at night.  Will get sharp pains in her hands.  Hands will also cramp/spasm.  sxs have worsened over the last few months.  Currently on Gabapentin $RemoveBefor'300mg'[id number]$  TID.  Currently on chemo for multiple myeloma  Depression- pt lost 2 of her daughters in the month of December.  Pt has a good support system and reports she is doing ok.  Not interested in grief counseling at this time.  No CP, SOB.   Review of Systems For ROS see HPI   This visit occurred during the SARS-CoV-2 public health emergency.  Safety protocols were in place, including screening questions prior to the visit, additional usage of staff PPE, and extensive cleaning of exam room while observing appropriate contact time as indicated for disinfecting solutions.       Objective:   Physical Exam Vitals reviewed.  Constitutional:      General: She is not in acute distress.    Appearance: Normal appearance. She is obese. She is not ill-appearing.  HENT:     Head: Normocephalic and atraumatic.  Eyes:     Extraocular Movements: Extraocular movements intact.     Conjunctiva/sclera: Conjunctivae normal.     Pupils: Pupils are equal, round, and reactive to light.  Cardiovascular:     Rate and Rhythm: Normal rate and regular rhythm.     Pulses: Normal pulses.     Heart sounds: Normal heart sounds.  Pulmonary:     Effort: Pulmonary effort is normal.     Breath sounds: Normal breath sounds.  Musculoskeletal:     Right lower leg: No edema.     Left lower leg: No edema.  Skin:    General: Skin is warm and dry.     Findings: No erythema or rash.  Neurological:     General: No focal deficit present.     Mental Status: She is alert and oriented to person, place, and time.     Sensory: No sensory deficit.     Motor: No  weakness.     Gait: Gait normal.     Deep Tendon Reflexes: Reflexes normal.  Psychiatric:        Mood and Affect: Mood normal.        Behavior: Behavior normal.        Thought Content: Thought content normal.           Assessment & Plan:  Polyneuropathy- her DM is likely contributing but will check labs to r/o other causes.  Will increase her Gabapentin to $RemoveBefor'300mg'[id number]$  QHS and continue $RemoveBefo'100mg'wNMXrSEMCum$  qAM and each afternoon to try and avoid sedation.  Pt expressed understanding and is in agreement w/ plan.

## 2020-08-29 NOTE — Progress Notes (Signed)
Review of pertinent gastrointestinal problems: 1.Intermittent abd pains, UGi symptoms:EGD Dr. Earlean Shawl, 05/2011. Done for "reflux symptoms despite therapy." Findings, "normal EGD". 2016 evaluation by Dr. Ardis Hughs intermittent epigastric pains, nausea; somewhatpositional. CT scan 07/2014 without clear etiologyEGD 08/2014 Dr. Ardis Hughs found minor gastritis: H. Pylori negative.  EGD 05/06/2020 found mild nonspecific gastritis.  Biopsies were negative for H. pylori.  Colonoscopy 04/2020 showed diverticulosis, hemorrhoids but was otherwise normal. 2. Routine risk for colon cancer:Colonoscopy Dr. Earlean Shawl, 05/2011. Done for "screening." Findings "inflammation at splenic flexure, medium hemorrhoids." pathology was normal. He recommended repeat colonoscopy at 5 years for unclear reasons.  Colonoscopy 04/2020 no polyps or cancers.   HPI: This is a very pleasant 75 year old woman   I saw her in the office here for Hemoccult positive stool September 2021.  Prior to that I had seen her about 4 years ago for iron deficiency anemia.  At that time she was Hemoccult negative.  She did not want to go through the colonoscopy which I recommended.  In September 2021 she admitted to intermittent rectal bleeding, she is getting chemotherapy for multiple myeloma.  I recommended EGD and colonoscopy to evaluate her multiple complaints as well as iron deficiency anemia.  I cannot tell the exact chain of events that led to her getting a small bowel capsule endoscopy however we did do that for her last month.  Capsule endoscopy showed possible 2 small nonbleeding AVMs in the upper intestine.  Lab testing August 24, 2020 hemoglobin 9.1, MCV 95, ferritin 227, iron 108  She does not see any melenic stools.  She periodically will have minor amount of bright red blood on the tissue paper when she wipes.   ROS: complete GI ROS as described in HPI, all other review negative.  Constitutional:  No unintentional weight  loss   Past Medical History:  Diagnosis Date  . Angiodysplasia of intestine 08/23/2020  . Anxiety   . Arthritis   . Breast cancer ([place]) 06/13/15  . Cancer ([place]) 2000   breast cancer  . Chronic bronchitis ([place])   . Chronic bronchitis ([place])   . Hyperlipidemia   . Hypertension   . Myocardial infarction ([place]) 2001  . Personal history of radiation therapy   . Restless leg   . Stroke [facility]) 2004   TIA, no deficits    Past Surgical History:  Procedure Laterality Date  . ABDOMINAL HYSTERECTOMY  1985  . BREAST LUMPECTOMY Left 2000   radiation and chemo  . BREAST LUMPECTOMY Right 2016   radiation  . BREAST SURGERY  2001   lt breast lumpectomy  . COLONOSCOPY WITH ESOPHAGOGASTRODUODENOSCOPY (EGD)  04/2020  . GIVENS CAPSULE STUDY  07/2020  . IR IMAGING GUIDED PORT INSERTION  04/25/2020  . RADIOACTIVE SEED GUIDED PARTIAL MASTECTOMY WITH AXILLARY SENTINEL LYMPH NODE BIOPSY Right 07/07/2015   Procedure: RIGHT RADIOACTIVE SEED GUIDED PARTIAL MASTECTOMY WITH AXILLARY SENTINEL LYMPH NODE BIOPSY;  Surgeon: Autumn Messing III, MD;  Location: [place];  Service: General;  Laterality: Right;  . SMALL INTESTINE SURGERY    . TUBAL LIGATION      Current Outpatient Medications  Medication Sig Dispense Refill  . Accu-Chek Softclix Lancets lancets Use as instructed to check sugars 1-2 times daily. 100 each 12  . acyclovir (ZOVIRAX) 400 MG tablet Take 1 tablet (400 mg total) by mouth 2 (two) times daily. 60 tablet 11  . albuterol (VENTOLIN HFA) 108 (90 Base) MCG/ACT inhaler Inhale 2 puffs into the lungs every 6 (six) hours as needed  for wheezing or shortness of breath. 8 g 2  . aspirin 81 MG tablet Take 81 mg by mouth daily.    Marland Kitchen atorvastatin (LIPITOR) 10 MG tablet TAKE 1 TABLET BY MOUTH  DAILY 90 tablet 3  . dexamethasone (DECADRON) 4 MG tablet Take 3 tabs with breakfast the day after each treatment. 20 tablet 4  . ergocalciferol (VITAMIN D2) 1.25 MG (50000 UT) capsule Take 1 capsule  (50,000 Units total) by mouth once a week. 12 capsule 2  . fexofenadine (ALLEGRA) 60 MG tablet Take 60 mg by mouth daily.    . fluticasone (FLOVENT HFA) 110 MCG/ACT inhaler Inhale 2 puffs into the lungs in the morning and at bedtime. 1 each 12  . gabapentin (NEURONTIN) 300 MG capsule TAKE 1 CAPSULE BY MOUTH 3  TIMES DAILY 270 capsule 3  . glucose blood (ACCU-CHEK GUIDE) test strip Use as instructed to check sugars 1-2 times daily. 100 each 12  . lidocaine-prilocaine (EMLA) cream Apply 1 application topically as needed. 30 g 0  . losartan (COZAAR) 100 MG tablet TAKE 1 TABLET BY MOUTH  DAILY 90 tablet 3  . metFORMIN (GLUCOPHAGE) 500 MG tablet TAKE 1 TABLET(500 MG) BY MOUTH TWICE DAILY WITH A MEAL 180 tablet 0  . mometasone (NASONEX) 50 MCG/ACT nasal spray Place 2 sprays into the nose daily. (Patient taking differently: Place 2 sprays into the nose as needed.) 17 g 12  . ondansetron (ZOFRAN) 8 MG tablet Take 1 tablet (8 mg total) by mouth 2 (two) times daily as needed (Nausea or vomiting). 30 tablet 1  . pantoprazole (PROTONIX) 40 MG tablet TAKE 1 TABLET BY MOUTH  TWICE DAILY 180 tablet 1  . potassium chloride SA (KLOR-CON) 20 MEQ tablet Take 1 tablet (20 mEq total) by mouth 2 (two) times daily. 60 tablet 1  . prochlorperazine (COMPAZINE) 10 MG tablet Take 1 tablet (10 mg total) by mouth every 6 (six) hours as needed (Nausea or vomiting). 30 tablet 1  . tiZANidine (ZANAFLEX) 4 MG tablet TAKE 1 TABLET(4 MG) BY MOUTH AT BEDTIME (Patient taking differently: Take 4 mg by mouth at bedtime.) 30 tablet 3  . traZODone (DESYREL) 100 MG tablet TAKE 1 TABLET BY MOUTH AT  BEDTIME 90 tablet 3   No current facility-administered medications for this visit.    Allergies as of 08/29/2020 - Review Complete 08/29/2020  Allergen Reaction Noted  . Bacitracin-neomycin-polymyxin  [neomycin-bacitracin zn-polymyx] Swelling 12/29/2014  . Nsaids Other (See Comments) 01/04/2015  . Tape Hives 11/07/2013  . Ambien [zolpidem  tartrate]  04/02/2012  . Amoxicillin  02/03/2012  . Contrast media [iodinated diagnostic agents] Hives 11/06/2013  . Latex Swelling 09/14/2014  . Prednisone  09/05/2014    Family History  Problem Relation Age of Onset  . Diabetes Father   . Lung cancer Sister        dx. <50; former smoker  . Diabetes Brother   . Diabetes Paternal Aunt   . Stroke Maternal Grandmother   . Diabetes Paternal Grandmother   . Emphysema Mother 84       smoker  . Diabetes Brother   . Brain cancer Brother 34       unknown tumor type  . Cancer Daughter 21       neck cancer  . Other Daughter        hysterectomy for unspecified reason  . Breast cancer Cousin   . Cancer Cousin        unspecified type  . Breast cancer Other  triple negative breast cancer in her 82s  . Cancer Daughter   . Colon polyps Neg Hx   . Esophageal cancer Neg Hx   . Gallbladder disease Neg Hx     Social History   Socioeconomic History  . Marital status: Divorced    Spouse name: Not on file  . Number of children: 7  . Years of education: Not on file  . Highest education level: Not on file  Occupational History  . Occupation: retired  Tobacco Use  . Smoking status: Former Smoker    Packs/day: 1.00    Years: 20.00    Pack years: 20.00    Types: Cigarettes    Quit date: 07/30/2011    Years since quitting: 9.0  . Smokeless tobacco: Never Used  . Tobacco comment: Quit >4 years ago; 1 ppd for about 5/20 years (remaining was less)  Vaping Use  . Vaping Use: Former  Substance and Sexual Activity  . Alcohol use: No    Alcohol/week: 0.0 standard drinks  . Drug use: No  . Sexual activity: Not Currently  Other Topics Concern  . Not on file  Social History Narrative   Lives alone.  Retired.  Education:  11th grade GED.  Children:  7 (one here).    Social Determinants of Health   Financial Resource Strain: Low Risk   . Difficulty of Paying Living Expenses: Not hard at all  Food Insecurity: No Food Insecurity  .  Worried About Charity fundraiser in the Last Year: Never true  . Ran Out of Food in the Last Year: Never true  Transportation Needs: No Transportation Needs  . Lack of Transportation (Medical): No  . Lack of Transportation (Non-Medical): No  Physical Activity: Inactive  . Days of Exercise per Week: 0 days  . Minutes of Exercise per Session: 0 min  Stress: No Stress Concern Present  . Feeling of Stress : Not at all  Social Connections: Moderately Isolated  . Frequency of Communication with Friends and Family: More than three times a week  . Frequency of Social Gatherings with Friends and Family: Once a week  . Attends Religious Services: 1 to 4 times per year  . Active Member of Clubs or Organizations: No  . Attends Archivist Meetings: Never  . Marital Status: Divorced  Human resources officer Violence: Not At Risk  . Fear of Current or Ex-Partner: No  . Emotionally Abused: No  . Physically Abused: No  . Sexually Abused: No     Physical Exam: BP 134/64   Pulse 72   Ht 5' (1.524 m)   Wt 181 lb (82.1 kg)   SpO2 98%   BMI 35.35 kg/m  Constitutional: generally well-appearing Psychiatric: alert and oriented x3 Abdomen: soft, nontender, nondistended, no obvious ascites, no peritoneal signs, normal bowel sounds No peripheral edema noted in lower extremities  Assessment and plan: 75 y.o. female with chronic anemia multifactorial  She has had a thorough GI work-up including a colonoscopy, upper endoscopy and capsule endoscopy all in the past for 5 months.  There is certainly nothing serious going on.  Capsule endoscopy last month suggested possible 2 small small bowel AVMs.  These are probably reachable by enteroscopy however it is certainly very unclear whether they are causing her any troubles.  She is not having any overt GI bleeding except for minor bright red blood on tissue paper rarely.  I think the bulk of her anemia is due to her multiple myeloma and  the treatments for  it.  I do not recommend any further GI testing at this point however she knows to call if she has significant overt GI bleeding with bright red blood per rectum or tarry stools.  Please see the "Patient Instructions" section for addition details about the plan.  Owens Loffler, MD [facility] 08/29/2020, 9:01 AM   Total time on date of encounter was 50mnutes (this included time spent preparing to see the patient reviewing records; obtaining and/or reviewing separately obtained history; performing a medically appropriate exam and/or evaluation; counseling and educating the patient and family if present; ordering medications, tests or procedures if applicable; and documenting clinical information in the health record).

## 2020-08-29 NOTE — Patient Instructions (Signed)
If you are age 77 or older, your body mass index should be between 23-30. Your Body mass index is 35.35 kg/m. If this is out of the aforementioned range listed, please consider follow up with your Primary Care Provider.  Please call our office if you have a lot of red blood or melanic stools.   Thank you for entrusting me with your care and choosing [facility].  Dr Ardis Hughs

## 2020-08-30 NOTE — Progress Notes (Signed)
HEMATOLOGY/ONCOLOGY CLINIC NOTE  Date of Service: 08/31/2020  Patient Care Team: Tracy Minium, MD as PCP - General (Family Medicine) Tracy Glasgow, MD as Attending Physician (Physical Medicine and Rehabilitation) Tracy Nakayama, MD as Consulting Physician (Orthopedic Surgery) Tracy Quitter, MD as Consulting Physician (Otolaryngology) Tracy Campbell, MD as Consulting Physician (Gastroenterology) Tracy Kussmaul, MD as Consulting Physician (General Surgery) Tracy [name], [facility] as Pharmacist (Pharmacist)  CHIEF COMPLAINTS/PURPOSE OF CONSULTATION:  Multiple Myeloma not having achieved remission  HISTORY OF PRESENTING ILLNESS:  Tracy [name] is a wonderful 75 y.o. female who has been referred to [place] by Dr [name] for evaluation and management of Multiple Myeloma. Pt is accompanied today by her daughter in person and other daughter and granddaughter via phone. The pt reports that she is doing well overall.   When pt was first diagnosed with Breast Cancer it was localized in both of her breasts and was considered Stage 1. She then received chemotherapy. However, she declined chemotherapy a second time and chose radiation therapy after recurrence. Pt has been under the surveillance of Dr. [name] after declining antiestrogen therapy.   The pt reports that she was having difficulty breathing so she saw her Pulmonologist, who ordered the work up. Based on the results of the PET/CT pt was sent to Dr. [name] as they were concerned that her breast cancer had spread. Pt has been becoming increasingly anemic over the last year, despite using a PO Iron supplement. She has had a positive Fecal Occult tests, but has been having issues with hemorrhoids and hemorrhoidal bleeding for the last 5-6 months. Pt denies any blood in her stools but notices blood on the tissue after she wipes. Pt has also been having some discomfort in her left hip and lower back. This pain is worsened when she sits down.    Pt has had a chronic cough that has been thought to either be Bronchitis or a result of acid reflux. Pt is planning to receive an Endoscopy to work this up further. She is currently taking Gabapentin for tingling/burning in her extremities. Pt has Type II Diabetes and was previously using Metformin, but discontinued due to concern for liver damage. She is not currently using any medications to control her Diabetes.   Pt had a heart attack in 2001. She initially though that she had a [employer], until the sensation began to travel up her body. She was given Nitroglycerin upon admittance to the hospital. Pt did not require any stents or other interventions. No cause for her heart attack was ever discovered. Pt has been seen by Neurology, Dr. Leta Baptist, who completed imaging on her neck and found a degeneratve disk. Pt has not had a nerve study conducted. Pt has no history of Shingles and has not received the Shingles vaccine. She has been fully vaccinated against he COVID19 virus.    Of note prior to the patient's visit today, pt has had PET/CT ([id number]) completed on 01/20/2020 with results revealing "1. Hypermetabolism corresponding to left acetabular and L5 lytic lesions, with differential considerations of metastatic disease or myeloma. 2. No evidence of hypermetabolic soft tissue primary, metastatic disease, or soft tissue myeloma. 3. Incidental findings, including: Aortic atherosclerosis (ICD10-I70.0) and emphysema (ICD10-J43.9). Hepatic steatosis."   Pt has had Bone Marrow Surgical Pathology ([id number]) completed on 01/17/2020 which revealed "BONE MARROW, ASPIRATE, CLOT, CORE: -Hypercellular bone marrow for age with plasma cell neoplasm PERIPHERAL BLOOD: -Normocytic-normochromic anemia"  Pt has had Left [place] Biopsy Surgical Pathology  Report [id number]) completed on 01/17/2020 which revealed "Plasma cell neoplasm."  Most recent lab results (01/25/2020) of CBC is as follows:  all values are WNL except for RBC at 2.44, Hgb at 7.5, HCT at 24.0, CO2 at 21, Glucose at 129, Creatinine at 1.38, Total Protein at 10.1, Albumin at 2.8, Total Bilirubin at <0.2, GFR Est Afr Am at 43. 01/25/2020 K/L light chains is as follows: Kappa free light chain at 22.7, Lamda free light chains at 17.7, K/L light chain ratio at 1.28 01/25/2020 MMP is as follows: all values are WNL except for IgG at 5220, Total Protein at 10.1, Alpha2 Glob at 1.2, Gamma Glob at 3.9, M Protein at 3.7, Total Globulin at 6.7, Albumin/Glob at 0.6. 01/25/2020 24-hr UPEP shows all values are WNL  On review of systems, pt reports lower back/left hip pain, unexpected weight loss, abdominal pain, loose stools and denies low appetite, constipation, rashes and any other symptoms.   On PMHx the pt reports Breast Cancer, Breast Lumpectomy, TIA, Restless leg, Myocardial infarction, HTN, HLD, Chronic Bronchitis.  INTERVAL HISTORY:  Tracy [name] is a wonderful 75 y.o. female who is here for evaluation and management of Multiple Myeloma. She is here for C8D8 Daratumumab, Carfilzomib, Dexamethasone treatment. The patient's last visit with [place] was on 08/04/2020. The pt reports that she is doing well overall. She is getting Daratumumab q2weeks and Carfilzomib D1,D8,D15 of every 28 day cycle.  The pt reports that she has been feeling as though her legs are swollen recently. Her neuropathy in hands and feet is still bothersome to her. The pt notes that she got an endoscopy and the results showed a few AVMs, non-bleeding.  Lab results today 08/31/2020 of CBC w/diff and CMP is as follows: all values are WNL except for RBC of 2.59, Hgb of 8.3, HCT of 24.9, CO2 of 21, Creatinine of 1.41, Calcium of 8.5, Total Protein of 6.4, Albumin of 3.4, AST of 11, GFR est of 39.  On review of systems, pt denies abdominal pain, acute back pain SOB, fevers, new bone pains, leg swelling and any other symptoms.  MEDICAL HISTORY:  Past Medical  History:  Diagnosis Date  . Angiodysplasia of intestine 08/23/2020  . Anxiety   . Arthritis   . Breast cancer ([place]) 06/13/15  . Cancer ([place]) 2000   breast cancer  . Chronic bronchitis ([place])   . Chronic bronchitis ([place])   . Hyperlipidemia   . Hypertension   . Myocardial infarction ([place]) 2001  . Personal history of radiation therapy   . Restless leg   . Stroke [facility]) 2004   TIA, no deficits    SURGICAL HISTORY: Past Surgical History:  Procedure Laterality Date  . ABDOMINAL HYSTERECTOMY  1985  . BREAST LUMPECTOMY Left 2000   radiation and chemo  . BREAST LUMPECTOMY Right 2016   radiation  . BREAST SURGERY  2001   lt breast lumpectomy  . COLONOSCOPY WITH ESOPHAGOGASTRODUODENOSCOPY (EGD)  04/2020  . GIVENS CAPSULE STUDY  07/2020  . IR IMAGING GUIDED PORT INSERTION  04/25/2020  . RADIOACTIVE SEED GUIDED PARTIAL MASTECTOMY WITH AXILLARY SENTINEL LYMPH NODE BIOPSY Right 07/07/2015   Procedure: RIGHT RADIOACTIVE SEED GUIDED PARTIAL MASTECTOMY WITH AXILLARY SENTINEL LYMPH NODE BIOPSY;  Surgeon: Autumn Messing III, MD;  Location: [place];  Service: General;  Laterality: Right;  . SMALL INTESTINE SURGERY    . TUBAL LIGATION      SOCIAL HISTORY: Social History   Socioeconomic History  . Marital status: Divorced  Spouse name: Not on file  . Number of children: 7  . Years of education: Not on file  . Highest education level: Not on file  Occupational History  . Occupation: retired  Tobacco Use  . Smoking status: Former Smoker    Packs/day: 1.00    Years: 20.00    Pack years: 20.00    Types: Cigarettes    Quit date: 07/30/2011    Years since quitting: 9.0  . Smokeless tobacco: Never Used  . Tobacco comment: Quit >4 years ago; 1 ppd for about 5/20 years (remaining was less)  Vaping Use  . Vaping Use: Former  Substance and Sexual Activity  . Alcohol use: No    Alcohol/week: 0.0 standard drinks  . Drug use: No  . Sexual activity: Not Currently  Other Topics  Concern  . Not on file  Social History Narrative   Lives alone.  Retired.  Education:  11th grade GED.  Children:  7 (one here).    Social Determinants of Health   Financial Resource Strain: Low Risk   . Difficulty of Paying Living Expenses: Not hard at all  Food Insecurity: No Food Insecurity  . Worried About Charity fundraiser in the Last Year: Never true  . Ran Out of Food in the Last Year: Never true  Transportation Needs: No Transportation Needs  . Lack of Transportation (Medical): No  . Lack of Transportation (Non-Medical): No  Physical Activity: Inactive  . Days of Exercise per Week: 0 days  . Minutes of Exercise per Session: 0 min  Stress: No Stress Concern Present  . Feeling of Stress : Not at all  Social Connections: Moderately Isolated  . Frequency of Communication with Friends and Family: More than three times a week  . Frequency of Social Gatherings with Friends and Family: Once a week  . Attends Religious Services: 1 to 4 times per year  . Active Member of Clubs or Organizations: No  . Attends Archivist Meetings: Never  . Marital Status: Divorced  Human resources officer Violence: Not At Risk  . Fear of Current or Ex-Partner: No  . Emotionally Abused: No  . Physically Abused: No  . Sexually Abused: No    FAMILY HISTORY: Family History  Problem Relation Age of Onset  . Diabetes Father   . Lung cancer Sister        dx. <50; former smoker  . Diabetes Brother   . Diabetes Paternal Aunt   . Stroke Maternal Grandmother   . Diabetes Paternal Grandmother   . Emphysema Mother 6       smoker  . Diabetes Brother   . Brain cancer Brother 48       unknown tumor type  . Cancer Daughter 24       neck cancer  . Other Daughter        hysterectomy for unspecified reason  . Breast cancer Cousin   . Cancer Cousin        unspecified type  . Breast cancer Other        triple negative breast cancer in her 75s  . Cancer Daughter   . Colon polyps Neg Hx   .  Esophageal cancer Neg Hx   . Gallbladder disease Neg Hx     ALLERGIES:  is allergic to bacitracin-neomycin-polymyxin  [neomycin-bacitracin zn-polymyx], nsaids, tape, ambien [zolpidem tartrate], amoxicillin, contrast media [iodinated diagnostic agents], latex, and prednisone.  MEDICATIONS:  Current Outpatient Medications  Medication Sig Dispense Refill  .  Accu-Chek Softclix Lancets lancets Use as instructed to check sugars 1-2 times daily. 100 each 12  . acyclovir (ZOVIRAX) 400 MG tablet Take 1 tablet (400 mg total) by mouth 2 (two) times daily. 60 tablet 11  . albuterol (VENTOLIN HFA) 108 (90 Base) MCG/ACT inhaler Inhale 2 puffs into the lungs every 6 (six) hours as needed for wheezing or shortness of breath. 8 g 2  . aspirin 81 MG tablet Take 81 mg by mouth daily.    Marland Kitchen atorvastatin (LIPITOR) 10 MG tablet TAKE 1 TABLET BY MOUTH  DAILY 90 tablet 3  . dexamethasone (DECADRON) 4 MG tablet Take 3 tabs with breakfast the day after each treatment. 20 tablet 4  . ergocalciferol (VITAMIN D2) 1.25 MG (50000 UT) capsule Take 1 capsule (50,000 Units total) by mouth once a week. 12 capsule 2  . fexofenadine (ALLEGRA) 60 MG tablet Take 60 mg by mouth daily.    . fluticasone (FLOVENT HFA) 110 MCG/ACT inhaler Inhale 2 puffs into the lungs in the morning and at bedtime. 1 each 12  . gabapentin (NEURONTIN) 300 MG capsule TAKE 1 CAPSULE BY MOUTH 3  TIMES DAILY 270 capsule 3  . glucose blood (ACCU-CHEK GUIDE) test strip Use as instructed to check sugars 1-2 times daily. 100 each 12  . lidocaine-prilocaine (EMLA) cream Apply 1 application topically as needed. 30 g 0  . losartan (COZAAR) 100 MG tablet TAKE 1 TABLET BY MOUTH  DAILY 90 tablet 3  . metFORMIN (GLUCOPHAGE) 500 MG tablet TAKE 1 TABLET(500 MG) BY MOUTH TWICE DAILY WITH A MEAL 180 tablet 0  . mometasone (NASONEX) 50 MCG/ACT nasal spray Place 2 sprays into the nose daily. (Patient taking differently: Place 2 sprays into the nose as needed.) 17 g 12  .  ondansetron (ZOFRAN) 8 MG tablet Take 1 tablet (8 mg total) by mouth 2 (two) times daily as needed (Nausea or vomiting). 30 tablet 1  . pantoprazole (PROTONIX) 40 MG tablet TAKE 1 TABLET BY MOUTH  TWICE DAILY 180 tablet 1  . potassium chloride SA (KLOR-CON) 20 MEQ tablet Take 1 tablet (20 mEq total) by mouth 2 (two) times daily. 60 tablet 1  . prochlorperazine (COMPAZINE) 10 MG tablet Take 1 tablet (10 mg total) by mouth every 6 (six) hours as needed (Nausea or vomiting). 30 tablet 1  . tiZANidine (ZANAFLEX) 4 MG tablet TAKE 1 TABLET(4 MG) BY MOUTH AT BEDTIME (Patient taking differently: Take 4 mg by mouth at bedtime.) 30 tablet 3  . traZODone (DESYREL) 100 MG tablet TAKE 1 TABLET BY MOUTH AT  BEDTIME 90 tablet 3   No current facility-administered medications for this visit.    REVIEW OF SYSTEMS:   10 Point review of Systems was done is negative except as noted above.  PHYSICAL EXAMINATION: ECOG PERFORMANCE STATUS: 2 - Symptomatic, <50% confined to bed  Exam was given in a wheelchair.  GENERAL:alert, in no acute distress and comfortable SKIN: no acute rashes, no significant lesions EYES: conjunctiva are pink and non-injected, sclera anicteric OROPHARYNX: MMM, no exudates, no oropharyngeal erythema or ulceration NECK: supple, no JVD LYMPH:  no palpable lymphadenopathy in the cervical, axillary or inguinal regions LUNGS: clear to auscultation b/l with normal respiratory effort HEART: regular rate & rhythm ABDOMEN:  normoactive bowel sounds , non tender, not distended. Extremity: no pedal edema PSYCH: alert & oriented x 3 with fluent speech NEURO: no focal motor/sensory deficits  LABORATORY DATA:  I have reviewed the data as listed  . CBC Latest  Ref Rng & Units 08/31/2020 08/24/2020 08/09/2020  WBC 4.0 - 10.5 K/uL 4.5 4.7 4.9  Hemoglobin 12.0 - 15.0 g/dL 8.3(L) 9.1(L) 9.0(L)  Hematocrit 36.0 - 46.0 % 24.9(L) 26.3(L) 26.3(L)  Platelets 150 - 400 K/uL 215 245 200    . CMP Latest Ref  Rng & Units 08/31/2020 08/24/2020 08/09/2020  Glucose 70 - 99 mg/dL 74 89 82  BUN 8 - 23 mg/dL _0 Creatinine 0.44 - 1.00 mg/dL 1.41(H) 1.49(H) 1.15(H)  Sodium 135 - 145 mmol/L 140 139 142  Potassium 3.5 - 5.1 mmol/L 4.5 4.8 4.0  Chloride 98 - 111 mmol/L 110 109 111  CO2 22 - 32 mmol/L 21(L) 22 22  Calcium 8.9 - 10.3 mg/dL 8.5(L) 8.8(L) 8.2(L)  Total Protein 6.5 - 8.1 g/dL 6.4(L) 6.8 6.7  Total Bilirubin 0.3 - 1.2 mg/dL 0.3 0.4 0.4  Alkaline Phos 38 - 126 U/L 68 73 70  AST 15 - 41 U/L 11(L) 14(L) 11(L)  ALT 0 - 44 U/L _1 05/16/2020 Upper Endoscopy:   05/16/2020 Colonoscopy:   01/25/2020 K/L light chains:    01/25/2020 MMP:            RADIOGRAPHIC STUDIES: I have personally reviewed the radiological images as listed and agreed with the findings in the report. MM DIAG BREAST TOMO BILATERAL  Result Date: 08/25/2020 CLINICAL DATA:  75 year old female with remote malignant left lumpectomy and malignant right lumpectomy in 2016. EXAM: DIGITAL DIAGNOSTIC BILATERAL MAMMOGRAM WITH TOMO AND CAD TECHNIQUE: Bilateral digital diagnostic mammography and breast tomosynthesis was performed. Digital images of the breasts were evaluated with computer-aided detection. COMPARISON:  Previous exam(s). ACR Breast Density Category b: There are scattered areas of fibroglandular density. FINDINGS: Stable post lumpectomy changes noted in the bilateral upper outer quadrants. No new or suspicious findings in either breast. The parenchymal pattern is stable. IMPRESSION: 1. No mammographic evidence of malignancy in either breast. 2. Stable bilateral posttreatment changes. RECOMMENDATION: Screening mammogram in one year.(Code:[id number]-B-01Y) I have discussed the findings and recommendations with the patient. If applicable, a reminder letter will be sent to the patient regarding the next appointment. BI-RADS CATEGORY  2: Benign. Electronically Signed   By: Kristopher Oppenheim M.D.   On: 08/25/2020 11:53    08/08/2020 Capsule Endoscopy   ASSESSMENT & PLAN:   75 yo with   1) Newly diagnosed IgG Kappa Multiple myeloma with bone lesions, anemia, renal insuff. M spike @ 3.7g/dl on diagnosis. 1p deletion, polymorphic variant, 13q deletion 2) h/o Dm2 3) Diabetic Neuropathy 4) CKD - likely from DM2, but could have an element of myeloma kidney. 5) h/o TIA and AMI 6) Iron deficiency   PLAN: -Discussed pt labwork today,08/31/20; anemia with Hgb of 8.3.  -Discussed pt Endoscopy 08/08/2020; two small AVMs that could be contributing to her anemia. -Advise pt her m protein is down to 0.3, which is nearly 90% decrease from start at nearly 4 grams. Advise pt she has reached VGPR.  -Discussed pt labs 08/24/2020; B12 levels low at 287, Thyroid function WNL, Ferritin 227 and Iron Sat of 32. -Advise pt she will start getting [place] Vitamin B12 q2weeks initially then q51monthfollowing. This may help with her neuropathy and anemia. This can be due to her Metformin medication she is on. -Advise pt she can crush up Potassium pill if bothersome and put in her food. -Discussed potential options for future-- maintenance treatment of only Carfilzomib q2weeks or referral to transplant center. The pt does  not wish to receive a transplant.  -Advised pt that we would get PET and Bm Bx prior to [facility] treatment following completion of induction treatment. This will be in 3 weeks.  -Advised pt to f/u w PCP regarding continuing daily ASA due to blood loss in stools and anemia. -Advised pt she will complete this cycle and finish with C9. -The pt has no prohibitive toxicities from continuing C8D8 Daratumumab + Carfilzomib necessitating treatment change at this time. -Will proceed with Carfilzomib at 56 mg/m^2 today. -Continue Zometa. -Will see back in 3 weeks.   FOLLOW UP: Adding B12 [place] every 2 weeks with treatment Please schedule cycle 9 of treatment as per orders Port flush and labs with each treatment CT bone  marrow biopsy in 2 weeks PET/CT in 2 weeks    The total time spent in the appointment was 30 minutes and more than 50% was on counseling and direct patient cares, ordering and management of chemotherapy  All of the patient's questions were answered with apparent satisfaction. The patient knows to call the clinic with any problems, questions or concerns.   [name] MD [place] AAHIVMS [facility] Hematology/Oncology Physician [facility]  (Office):       [phone] (Work cell):  [phone] (Fax):           [phone]  08/31/2020 11:07 AM   I, Reinaldo Raddle, am acting as scribe for Dr. [name], MD.    .I have reviewed the above documentation for accuracy and completeness, and I agree with the above. [name] MD

## 2020-08-31 ENCOUNTER — Inpatient Hospital Stay: Payer: Medicare Other | Attending: Hematology

## 2020-08-31 ENCOUNTER — Inpatient Hospital Stay (HOSPITAL_BASED_OUTPATIENT_CLINIC_OR_DEPARTMENT_OTHER): Payer: Medicare Other | Admitting: Hematology

## 2020-08-31 ENCOUNTER — Inpatient Hospital Stay: Payer: Medicare Other

## 2020-08-31 ENCOUNTER — Other Ambulatory Visit: Payer: Self-pay

## 2020-08-31 VITALS — BP 127/55 | HR 67 | Temp 97.5°F | Resp 17 | Ht 60.0 in | Wt 180.5 lb

## 2020-08-31 DIAGNOSIS — N189 Chronic kidney disease, unspecified: Secondary | ICD-10-CM | POA: Insufficient documentation

## 2020-08-31 DIAGNOSIS — Z87891 Personal history of nicotine dependence: Secondary | ICD-10-CM | POA: Insufficient documentation

## 2020-08-31 DIAGNOSIS — C9000 Multiple myeloma not having achieved remission: Secondary | ICD-10-CM | POA: Insufficient documentation

## 2020-08-31 DIAGNOSIS — Z8673 Personal history of transient ischemic attack (TIA), and cerebral infarction without residual deficits: Secondary | ICD-10-CM | POA: Insufficient documentation

## 2020-08-31 DIAGNOSIS — I252 Old myocardial infarction: Secondary | ICD-10-CM | POA: Insufficient documentation

## 2020-08-31 DIAGNOSIS — D509 Iron deficiency anemia, unspecified: Secondary | ICD-10-CM

## 2020-08-31 DIAGNOSIS — Z923 Personal history of irradiation: Secondary | ICD-10-CM | POA: Diagnosis not present

## 2020-08-31 DIAGNOSIS — E1122 Type 2 diabetes mellitus with diabetic chronic kidney disease: Secondary | ICD-10-CM | POA: Insufficient documentation

## 2020-08-31 DIAGNOSIS — Z853 Personal history of malignant neoplasm of breast: Secondary | ICD-10-CM | POA: Insufficient documentation

## 2020-08-31 DIAGNOSIS — Z95828 Presence of other vascular implants and grafts: Secondary | ICD-10-CM

## 2020-08-31 DIAGNOSIS — Z5112 Encounter for antineoplastic immunotherapy: Secondary | ICD-10-CM

## 2020-08-31 DIAGNOSIS — Z7189 Other specified counseling: Secondary | ICD-10-CM

## 2020-08-31 DIAGNOSIS — E1140 Type 2 diabetes mellitus with diabetic neuropathy, unspecified: Secondary | ICD-10-CM | POA: Insufficient documentation

## 2020-08-31 LAB — CMP (CANCER CENTER ONLY)
ALT: 12 U/L (ref 0–44)
AST: 11 U/L — ABNORMAL LOW (ref 15–41)
Albumin: 3.4 g/dL — ABNORMAL LOW (ref 3.5–5.0)
Alkaline Phosphatase: 68 U/L (ref 38–126)
Anion gap: 9 (ref 5–15)
BUN: 18 mg/dL (ref 8–23)
CO2: 21 mmol/L — ABNORMAL LOW (ref 22–32)
Calcium: 8.5 mg/dL — ABNORMAL LOW (ref 8.9–10.3)
Chloride: 110 mmol/L (ref 98–111)
Creatinine: 1.41 mg/dL — ABNORMAL HIGH (ref 0.44–1.00)
GFR, Estimated: 39 mL/min — ABNORMAL LOW (ref >60)
Glucose, Bld: 74 mg/dL (ref 70–99)
Potassium: 4.5 mmol/L (ref 3.5–5.1)
Sodium: 140 mmol/L (ref 135–145)
Total Bilirubin: 0.3 mg/dL (ref 0.3–1.2)
Total Protein: 6.4 g/dL — ABNORMAL LOW (ref 6.5–8.1)

## 2020-08-31 LAB — CBC WITH DIFFERENTIAL/PLATELET
Abs Immature Granulocytes: 0.03 10*3/uL (ref 0.00–0.07)
Basophils Absolute: 0 10*3/uL (ref 0.0–0.1)
Basophils Relative: 0 %
Eosinophils Absolute: 0 10*3/uL (ref 0.0–0.5)
Eosinophils Relative: 1 %
HCT: 24.9 % — ABNORMAL LOW (ref 36.0–46.0)
Hemoglobin: 8.3 g/dL — ABNORMAL LOW (ref 12.0–15.0)
Immature Granulocytes: 1 %
Lymphocytes Relative: 25 %
Lymphs Abs: 1.1 10*3/uL (ref 0.7–4.0)
MCH: 32 pg (ref 26.0–34.0)
MCHC: 33.3 g/dL (ref 30.0–36.0)
MCV: 96.1 fL (ref 80.0–100.0)
Monocytes Absolute: 0.5 10*3/uL (ref 0.1–1.0)
Monocytes Relative: 11 %
Neutro Abs: 2.8 10*3/uL (ref 1.7–7.7)
Neutrophils Relative %: 62 %
Platelets: 215 10*3/uL (ref 150–400)
RBC: 2.59 MIL/uL — ABNORMAL LOW (ref 3.87–5.11)
RDW: 14.8 % (ref 11.5–15.5)
WBC: 4.5 10*3/uL (ref 4.0–10.5)
nRBC: 0 % (ref 0.0–0.2)

## 2020-08-31 MED ORDER — ACETAMINOPHEN 325 MG PO TABS
650.0000 mg | ORAL_TABLET | Freq: Once | ORAL | Status: AC
  Administered 2020-08-31: 650 mg via ORAL

## 2020-08-31 MED ORDER — CYANOCOBALAMIN 1000 MCG/ML IJ SOLN
INTRAMUSCULAR | Status: AC
  Filled 2020-08-31: qty 1

## 2020-08-31 MED ORDER — SODIUM CHLORIDE 0.9 % IV SOLN
Freq: Once | INTRAVENOUS | Status: DC
  Filled 2020-08-31: qty 250

## 2020-08-31 MED ORDER — DIPHENHYDRAMINE HCL 25 MG PO CAPS
ORAL_CAPSULE | ORAL | Status: AC
  Filled 2020-08-31: qty 2

## 2020-08-31 MED ORDER — FAMOTIDINE IN NACL 20-0.9 MG/50ML-% IV SOLN
INTRAVENOUS | Status: AC
  Filled 2020-08-31: qty 50

## 2020-08-31 MED ORDER — SODIUM CHLORIDE 0.9% FLUSH
10.0000 mL | INTRAVENOUS | Status: DC | PRN
  Administered 2020-08-31: 10 mL
  Filled 2020-08-31: qty 10

## 2020-08-31 MED ORDER — CYANOCOBALAMIN 1000 MCG/ML IJ SOLN
1000.0000 ug | Freq: Once | INTRAMUSCULAR | Status: AC
  Administered 2020-08-31: 1000 ug via SUBCUTANEOUS

## 2020-08-31 MED ORDER — SODIUM CHLORIDE 0.9 % IV SOLN
20.0000 mg | Freq: Once | INTRAVENOUS | Status: AC
  Administered 2020-08-31: 20 mg via INTRAVENOUS
  Filled 2020-08-31: qty 20

## 2020-08-31 MED ORDER — ACETAMINOPHEN 325 MG PO TABS
ORAL_TABLET | ORAL | Status: AC
  Filled 2020-08-31: qty 2

## 2020-08-31 MED ORDER — DEXTROSE 5 % IV SOLN
56.0000 mg/m2 | Freq: Once | INTRAVENOUS | Status: AC
  Administered 2020-08-31: 110 mg via INTRAVENOUS
  Filled 2020-08-31: qty 10

## 2020-08-31 MED ORDER — FAMOTIDINE IN NACL 20-0.9 MG/50ML-% IV SOLN
20.0000 mg | Freq: Once | INTRAVENOUS | Status: AC
  Administered 2020-08-31: 20 mg via INTRAVENOUS

## 2020-08-31 MED ORDER — HEPARIN SOD (PORK) LOCK FLUSH 100 UNIT/ML IV SOLN
500.0000 [IU] | Freq: Once | INTRAVENOUS | Status: AC | PRN
  Administered 2020-08-31: 500 [IU]
  Filled 2020-08-31: qty 5

## 2020-08-31 MED ORDER — SODIUM CHLORIDE 0.9 % IV SOLN
Freq: Once | INTRAVENOUS | Status: AC
  Filled 2020-08-31: qty 250

## 2020-08-31 MED ORDER — DIPHENHYDRAMINE HCL 25 MG PO CAPS
50.0000 mg | ORAL_CAPSULE | Freq: Once | ORAL | Status: AC
  Administered 2020-08-31: 50 mg via ORAL

## 2020-08-31 NOTE — Patient Instructions (Addendum)
[facility] Discharge Instructions for Patients Receiving Chemotherapy  Today you received the following chemotherapy agents: Kyprolis  To help prevent nausea and vomiting after your treatment, we encourage you to take your nausea medication as directed.   If you develop nausea and vomiting that is not controlled by your nausea medication, call the clinic.   BELOW ARE SYMPTOMS THAT SHOULD BE REPORTED IMMEDIATELY:  *FEVER GREATER THAN 100.5 F  *CHILLS WITH OR WITHOUT FEVER  NAUSEA AND VOMITING THAT IS NOT CONTROLLED WITH YOUR NAUSEA MEDICATION  *UNUSUAL SHORTNESS OF BREATH  *UNUSUAL BRUISING OR BLEEDING  TENDERNESS IN MOUTH AND THROAT WITH OR WITHOUT PRESENCE OF ULCERS  *URINARY PROBLEMS  *BOWEL PROBLEMS  UNUSUAL RASH Items with * indicate a potential emergency and should be followed up as soon as possible.  Feel free to call the clinic should you have any questions or concerns. The clinic phone number is [phone].  Please show the [place] at check-in to the Emergency Department and triage nurse.  Cyanocobalamin, Vitamin B12 injection What is this medicine? CYANOCOBALAMIN (sye an oh koe BAL a min) is a man made form of vitamin B12. Vitamin B12 is used in the growth of healthy blood cells, nerve cells, and proteins in the body. It also helps with the metabolism of fats and carbohydrates. This medicine is used to treat people who can not absorb vitamin B12. This medicine may be used for other purposes; ask your health care provider or pharmacist if you have questions. COMMON BRAND NAME(S): B-12 Compliance Kit, B-12 Injection Kit, Cyomin, LA-12, Nutri-Twelve, Physicians EZ Use B-12, Primabalt What should I tell my health care provider before I take this medicine? They need to know if you have any of these conditions:  kidney disease  Leber's disease  megaloblastic anemia  an unusual or allergic reaction to cyanocobalamin, cobalt, other  medicines, foods, dyes, or preservatives  pregnant or trying to get pregnant  breast-feeding How should I use this medicine? This medicine is injected into a muscle or deeply under the skin. It is usually given by a health care professional in a clinic or doctor's office. However, your doctor may teach you how to inject yourself. Follow all instructions. Talk to your pediatrician regarding the use of this medicine in children. Special care may be needed. Overdosage: If you think you have taken too much of this medicine contact a poison control center or emergency room at once. NOTE: This medicine is only for you. Do not share this medicine with others. What if I miss a dose? If you are given your dose at a clinic or doctor's office, call to reschedule your appointment. If you give your own injections and you miss a dose, take it as soon as you can. If it is almost time for your next dose, take only that dose. Do not take double or extra doses. What may interact with this medicine?  colchicine  heavy alcohol intake This list may not describe all possible interactions. Give your health care provider a list of all the medicines, herbs, non-prescription drugs, or dietary supplements you use. Also tell them if you smoke, drink alcohol, or use illegal drugs. Some items may interact with your medicine. What should I watch for while using this medicine? Visit your doctor or health care professional regularly. You may need blood work done while you are taking this medicine. You may need to follow a special diet. Talk to your doctor. Limit your alcohol intake and  avoid smoking to get the best benefit. What side effects may I notice from receiving this medicine? Side effects that you should report to your doctor or health care professional as soon as possible:  allergic reactions like skin rash, itching or hives, swelling of the face, lips, or tongue  blue tint to skin  chest tightness,  pain  difficulty breathing, wheezing  dizziness  red, swollen painful area on the leg Side effects that usually do not require medical attention (report to your doctor or health care professional if they continue or are bothersome):  diarrhea  headache This list may not describe all possible side effects. Call your doctor for medical advice about side effects. You may report side effects to FDA at 1-800-FDA-1088. Where should I keep my medicine? Keep out of the reach of children. Store at room temperature between 15 and 30 degrees C (59 and 85 degrees F). Protect from light. Throw away any unused medicine after the expiration date. NOTE: This sheet is a summary. It may not cover all possible information. If you have questions about this medicine, talk to your doctor, pharmacist, or health care provider.  2021 Elsevier/Gold Standard (2007-10-26 22:10:20)

## 2020-09-01 ENCOUNTER — Telehealth: Payer: Self-pay | Admitting: Family Medicine

## 2020-09-01 NOTE — Telephone Encounter (Signed)
Patient called and said that her cancer doctor wants to know if she can go off of her 1 aspirin a day due to hemoglobin level being so low.  Please contact Dr. Irene Limbo at [facility] [phone]

## 2020-09-03 NOTE — Assessment & Plan Note (Signed)
Chronic problem.  She lost 2 of her daughters in December and has reason to be upset.  She feels like she is in a good place and has a good support system.  Not interested in grief counseling at this time.  Will follow closely.

## 2020-09-03 NOTE — Assessment & Plan Note (Signed)
Pt's neuropathy has deteriorated.  Worse at night.  Based on this, will increase PM gabapentin to 300mg  and continue 100mg  qAM and afternoon.  Will check labs to ensure adequate sugar control.  Pt expressed understanding and is in agreement w/ plan.

## 2020-09-03 NOTE — Telephone Encounter (Signed)
If Dr Irene Limbo feels the benefit of stopping ASA 84m outweighs the risk of CAD I am ok with stopping it.  He is more familiar with the possibility of hypercoagulable states due to her multiple myeloma so I defer the decision to him and his team.

## 2020-09-04 ENCOUNTER — Telehealth: Payer: Self-pay

## 2020-09-04 NOTE — Telephone Encounter (Signed)
Called and spoke with patient to address concerns. Pt understood. No further concerns at this time.

## 2020-09-04 NOTE — Telephone Encounter (Signed)
Called and spoke with pt and informed her that Dr.[name] agrees with Dr.Kale about stopping the asprin 81mg . Pt understood. No further concerns at this time.

## 2020-09-07 ENCOUNTER — Inpatient Hospital Stay: Payer: Medicare Other

## 2020-09-07 ENCOUNTER — Inpatient Hospital Stay (HOSPITAL_BASED_OUTPATIENT_CLINIC_OR_DEPARTMENT_OTHER): Payer: Medicare Other | Admitting: Medical

## 2020-09-07 ENCOUNTER — Other Ambulatory Visit: Payer: Self-pay

## 2020-09-07 VITALS — BP 134/73 | HR 97 | Temp 98.4°F | Resp 18

## 2020-09-07 DIAGNOSIS — C9000 Multiple myeloma not having achieved remission: Secondary | ICD-10-CM

## 2020-09-07 DIAGNOSIS — Z95828 Presence of other vascular implants and grafts: Secondary | ICD-10-CM

## 2020-09-07 DIAGNOSIS — D509 Iron deficiency anemia, unspecified: Secondary | ICD-10-CM

## 2020-09-07 DIAGNOSIS — E1140 Type 2 diabetes mellitus with diabetic neuropathy, unspecified: Secondary | ICD-10-CM | POA: Diagnosis not present

## 2020-09-07 DIAGNOSIS — Z5112 Encounter for antineoplastic immunotherapy: Secondary | ICD-10-CM | POA: Diagnosis not present

## 2020-09-07 DIAGNOSIS — Z7189 Other specified counseling: Secondary | ICD-10-CM

## 2020-09-07 DIAGNOSIS — Z8673 Personal history of transient ischemic attack (TIA), and cerebral infarction without residual deficits: Secondary | ICD-10-CM | POA: Diagnosis not present

## 2020-09-07 DIAGNOSIS — Z853 Personal history of malignant neoplasm of breast: Secondary | ICD-10-CM | POA: Diagnosis not present

## 2020-09-07 DIAGNOSIS — I252 Old myocardial infarction: Secondary | ICD-10-CM | POA: Diagnosis not present

## 2020-09-07 DIAGNOSIS — Z923 Personal history of irradiation: Secondary | ICD-10-CM | POA: Diagnosis not present

## 2020-09-07 DIAGNOSIS — Z87891 Personal history of nicotine dependence: Secondary | ICD-10-CM | POA: Diagnosis not present

## 2020-09-07 DIAGNOSIS — E1122 Type 2 diabetes mellitus with diabetic chronic kidney disease: Secondary | ICD-10-CM | POA: Diagnosis not present

## 2020-09-07 DIAGNOSIS — N189 Chronic kidney disease, unspecified: Secondary | ICD-10-CM | POA: Diagnosis not present

## 2020-09-07 LAB — CBC WITH DIFFERENTIAL/PLATELET
Abs Immature Granulocytes: 0.02 10*3/uL (ref 0.00–0.07)
Basophils Absolute: 0 10*3/uL (ref 0.0–0.1)
Basophils Relative: 0 %
Eosinophils Absolute: 0 10*3/uL (ref 0.0–0.5)
Eosinophils Relative: 1 %
HCT: 24.2 % — ABNORMAL LOW (ref 36.0–46.0)
Hemoglobin: 8.1 g/dL — ABNORMAL LOW (ref 12.0–15.0)
Immature Granulocytes: 0 %
Lymphocytes Relative: 24 %
Lymphs Abs: 1.1 10*3/uL (ref 0.7–4.0)
MCH: 32.4 pg (ref 26.0–34.0)
MCHC: 33.5 g/dL (ref 30.0–36.0)
MCV: 96.8 fL (ref 80.0–100.0)
Monocytes Absolute: 0.4 10*3/uL (ref 0.1–1.0)
Monocytes Relative: 9 %
Neutro Abs: 3 10*3/uL (ref 1.7–7.7)
Neutrophils Relative %: 66 %
Platelets: 191 10*3/uL (ref 150–400)
RBC: 2.5 MIL/uL — ABNORMAL LOW (ref 3.87–5.11)
RDW: 15.2 % (ref 11.5–15.5)
WBC: 4.6 10*3/uL (ref 4.0–10.5)
nRBC: 0 % (ref 0.0–0.2)

## 2020-09-07 LAB — CMP (CANCER CENTER ONLY)
ALT: 10 U/L (ref 0–44)
AST: 10 U/L — ABNORMAL LOW (ref 15–41)
Albumin: 3.5 g/dL (ref 3.5–5.0)
Alkaline Phosphatase: 68 U/L (ref 38–126)
Anion gap: 9 (ref 5–15)
BUN: 22 mg/dL (ref 8–23)
CO2: 20 mmol/L — ABNORMAL LOW (ref 22–32)
Calcium: 9.2 mg/dL (ref 8.9–10.3)
Chloride: 111 mmol/L (ref 98–111)
Creatinine: 1.48 mg/dL — ABNORMAL HIGH (ref 0.44–1.00)
GFR, Estimated: 36 mL/min — ABNORMAL LOW (ref >60)
Glucose, Bld: 88 mg/dL (ref 70–99)
Potassium: 4.7 mmol/L (ref 3.5–5.1)
Sodium: 140 mmol/L (ref 135–145)
Total Bilirubin: 0.3 mg/dL (ref 0.3–1.2)
Total Protein: 6.5 g/dL (ref 6.5–8.1)

## 2020-09-07 MED ORDER — ACETAMINOPHEN 325 MG PO TABS
ORAL_TABLET | ORAL | Status: AC
  Filled 2020-09-07: qty 2

## 2020-09-07 MED ORDER — ACETAMINOPHEN 325 MG PO TABS
650.0000 mg | ORAL_TABLET | Freq: Once | ORAL | Status: AC
  Administered 2020-09-07: 650 mg via ORAL

## 2020-09-07 MED ORDER — CYANOCOBALAMIN 1000 MCG/ML IJ SOLN
1000.0000 ug | Freq: Once | INTRAMUSCULAR | Status: AC
  Administered 2020-09-07: 1000 ug via SUBCUTANEOUS

## 2020-09-07 MED ORDER — SODIUM CHLORIDE 0.9 % IV SOLN
Freq: Once | INTRAVENOUS | Status: DC
  Filled 2020-09-07: qty 250

## 2020-09-07 MED ORDER — ONDANSETRON HCL 4 MG/2ML IJ SOLN
INTRAMUSCULAR | Status: AC
  Filled 2020-09-07: qty 2

## 2020-09-07 MED ORDER — SODIUM CHLORIDE 0.9% FLUSH
10.0000 mL | Freq: Once | INTRAVENOUS | Status: AC
  Administered 2020-09-07: 10 mL
  Filled 2020-09-07: qty 10

## 2020-09-07 MED ORDER — HEPARIN SOD (PORK) LOCK FLUSH 100 UNIT/ML IV SOLN
500.0000 [IU] | Freq: Once | INTRAVENOUS | Status: AC | PRN
  Administered 2020-09-07: 500 [IU]
  Filled 2020-09-07: qty 5

## 2020-09-07 MED ORDER — FAMOTIDINE IN NACL 20-0.9 MG/50ML-% IV SOLN
20.0000 mg | Freq: Once | INTRAVENOUS | Status: AC
  Administered 2020-09-07: 20 mg via INTRAVENOUS

## 2020-09-07 MED ORDER — SODIUM CHLORIDE 0.9% FLUSH
10.0000 mL | INTRAVENOUS | Status: DC | PRN
  Administered 2020-09-07: 10 mL
  Filled 2020-09-07: qty 10

## 2020-09-07 MED ORDER — SODIUM CHLORIDE 0.9 % IV SOLN
16.0000 mg/kg | Freq: Once | INTRAVENOUS | Status: AC
  Administered 2020-09-07: 1400 mg via INTRAVENOUS
  Filled 2020-09-07: qty 60

## 2020-09-07 MED ORDER — CYANOCOBALAMIN 1000 MCG/ML IJ SOLN
INTRAMUSCULAR | Status: AC
  Filled 2020-09-07: qty 1

## 2020-09-07 MED ORDER — DIPHENHYDRAMINE HCL 25 MG PO CAPS
ORAL_CAPSULE | ORAL | Status: AC
  Filled 2020-09-07: qty 2

## 2020-09-07 MED ORDER — DEXTROSE 5 % IV SOLN
56.0000 mg/m2 | Freq: Once | INTRAVENOUS | Status: AC
  Administered 2020-09-07: 110 mg via INTRAVENOUS
  Filled 2020-09-07: qty 30

## 2020-09-07 MED ORDER — FAMOTIDINE IN NACL 20-0.9 MG/50ML-% IV SOLN
INTRAVENOUS | Status: AC
  Filled 2020-09-07: qty 50

## 2020-09-07 MED ORDER — ONDANSETRON HCL 4 MG/2ML IJ SOLN
4.0000 mg | Freq: Once | INTRAMUSCULAR | Status: AC
  Administered 2020-09-07: 4 mg via INTRAVENOUS

## 2020-09-07 MED ORDER — DIPHENHYDRAMINE HCL 25 MG PO CAPS
50.0000 mg | ORAL_CAPSULE | Freq: Once | ORAL | Status: AC
  Administered 2020-09-07: 50 mg via ORAL

## 2020-09-07 MED ORDER — SODIUM CHLORIDE 0.9 % IV SOLN
20.0000 mg | Freq: Once | INTRAVENOUS | Status: AC
  Administered 2020-09-07: 20 mg via INTRAVENOUS
  Filled 2020-09-07: qty 20

## 2020-09-07 MED ORDER — SODIUM CHLORIDE 0.9 % IV SOLN
Freq: Once | INTRAVENOUS | Status: AC
Start: 2020-09-07 — End: 2020-09-07
  Filled 2020-09-07: qty 250

## 2020-09-07 NOTE — Progress Notes (Signed)
Per Dr. Irene Limbo - okay for patient to receive B-12 injection today, despite being on a 2-week schedule. After today, patient can follow the treatment schedule to avoid coming in on her off weeks.

## 2020-09-07 NOTE — Patient Instructions (Signed)
[facility] Discharge Instructions for Patients Receiving Chemotherapy  Please follow-up with your eye doctor and primary care doctor.   Today you received the following chemotherapy agents: Carfilzomib (Kyprolis) and Daratumuab (Darzalex)  To help prevent nausea and vomiting after your treatment, we encourage you to take your nausea medication  as prescribed.    If you develop nausea and vomiting that is not controlled by your nausea medication, call the clinic.   BELOW ARE SYMPTOMS THAT SHOULD BE REPORTED IMMEDIATELY:  *FEVER GREATER THAN 100.5 F  *CHILLS WITH OR WITHOUT FEVER  NAUSEA AND VOMITING THAT IS NOT CONTROLLED WITH YOUR NAUSEA MEDICATION  *UNUSUAL SHORTNESS OF BREATH  *UNUSUAL BRUISING OR BLEEDING  TENDERNESS IN MOUTH AND THROAT WITH OR WITHOUT PRESENCE OF ULCERS  *URINARY PROBLEMS  *BOWEL PROBLEMS  UNUSUAL RASH Items with * indicate a potential emergency and should be followed up as soon as possible.  Feel free to call the clinic should you have any questions or concerns. The clinic phone number is [phone].  Please show the [facility] at check-in to the Emergency Department and triage nurse.

## 2020-09-07 NOTE — Patient Instructions (Signed)
Implanted Port Insertion, Care After This sheet gives you information about how to care for yourself after your procedure. Your health care provider may also give you more specific instructions. If you have problems or questions, contact your health care provider. What can I expect after the procedure? After the procedure, it is common to have:  Discomfort at the port insertion site.  Bruising on the skin over the port. This should improve over 3-4 days. Follow these instructions at home: Port care  After your port is placed, you will get a manufacturer's information card. The card has information about your port. Keep this card with you at all times.  Take care of the port as told by your health care provider. Ask your health care provider if you or a family member can get training for taking care of the port at home. A home health care nurse may also take care of the port.  Make sure to remember what type of port you have. Incision care  Follow instructions from your health care provider about how to take care of your port insertion site. Make sure you: ? Wash your hands with soap and water before and after you change your bandage (dressing). If soap and water are not available, use hand sanitizer. ? Change your dressing as told by your health care provider. ? Leave stitches (sutures), skin glue, or adhesive strips in place. These skin closures may need to stay in place for 2 weeks or longer. If adhesive strip edges start to loosen and curl up, you may trim the loose edges. Do not remove adhesive strips completely unless your health care provider tells you to do that.  Check your port insertion site every day for signs of infection. Check for: ? Redness, swelling, or pain. ? Fluid or blood. ? Warmth. ? Pus or a bad smell.      Activity  Return to your normal activities as told by your health care provider. Ask your health care provider what activities are safe for you.  Do not  lift anything that is heavier than 10 lb (4.5 kg), or the limit that you are told, until your health care provider says that it is safe. General instructions  Take over-the-counter and prescription medicines only as told by your health care provider.  Do not take baths, swim, or use a hot tub until your health care provider approves. Ask your health care provider if you may take showers. You may only be allowed to take sponge baths.  Do not drive for 24 hours if you were given a sedative during your procedure.  Wear a medical alert bracelet in case of an emergency. This will tell any health care providers that you have a port.  Keep all follow-up visits as told by your health care provider. This is important. Contact a health care provider if:  You cannot flush your port with saline as directed, or you cannot draw blood from the port.  You have a fever or chills.  You have redness, swelling, or pain around your port insertion site.  You have fluid or blood coming from your port insertion site.  Your port insertion site feels warm to the touch.  You have pus or a bad smell coming from the port insertion site. Get help right away if:  You have chest pain or shortness of breath.  You have bleeding from your port that you cannot control. Summary  Take care of the port as told by your   health care provider. Keep the manufacturer's information card with you at all times.  Change your dressing as told by your health care provider.  Contact a health care provider if you have a fever or chills or if you have redness, swelling, or pain around your port insertion site.  Keep all follow-up visits as told by your health care provider. This information is not intended to replace advice given to you by your health care provider. Make sure you discuss any questions you have with your health care provider. Document Revised: 02/10/2018 Document Reviewed: 02/10/2018 Elsevier Patient Education   2021 [employer]  

## 2020-09-11 LAB — MULTIPLE MYELOMA PANEL, SERUM
Albumin SerPl Elph-Mcnc: 3.3 g/dL (ref 2.9–4.4)
Albumin/Glob SerPl: 1.3 (ref 0.7–1.7)
Alpha 1: 0.2 g/dL (ref 0.0–0.4)
Alpha2 Glob SerPl Elph-Mcnc: 1.2 g/dL — ABNORMAL HIGH (ref 0.4–1.0)
B-Globulin SerPl Elph-Mcnc: 0.8 g/dL (ref 0.7–1.3)
Gamma Glob SerPl Elph-Mcnc: 0.4 g/dL (ref 0.4–1.8)
Globulin, Total: 2.6 g/dL (ref 2.2–3.9)
IgA: 48 mg/dL — ABNORMAL LOW (ref 64–422)
IgG (Immunoglobin G), Serum: 430 mg/dL — ABNORMAL LOW (ref 586–1602)
IgM (Immunoglobulin M), Srm: 26 mg/dL (ref 26–217)
M Protein SerPl Elph-Mcnc: 0.2 g/dL — ABNORMAL HIGH
Total Protein ELP: 5.9 g/dL — ABNORMAL LOW (ref 6.0–8.5)

## 2020-09-11 NOTE — Progress Notes (Signed)
This patient was seen in the infusion room.  She reports that she has been having dizziness, left-sided headaches, instability of her gait, sharp pains in her eyes, and upper and lower extremity cramps.  She is seen by Dr. [name] and is receiving Kyprolis and Darzalex.  She reports that these symptoms have been going on for a while and are not acute in nature.  She reports that she has had a history of TIAs.  Her exam today was negative.  No carotid bruits noted.  No cardiac murmurs were heard.  She has been instructed to follow-up with her primary care provider Dr. [name]  She expresses understanding and agreement with this plan.  Sandi Mealy, MHS, PA-C Physician Assistant

## 2020-09-14 ENCOUNTER — Encounter: Payer: Self-pay | Admitting: Family Medicine

## 2020-09-14 ENCOUNTER — Ambulatory Visit (INDEPENDENT_AMBULATORY_CARE_PROVIDER_SITE_OTHER)
Admission: RE | Admit: 2020-09-14 | Discharge: 2020-09-14 | Disposition: A | Discharge status: Home / Self Care | Payer: Medicare Other | Source: Ambulatory Visit | Attending: Family Medicine | Admitting: Family Medicine

## 2020-09-14 ENCOUNTER — Other Ambulatory Visit: Payer: Self-pay

## 2020-09-14 ENCOUNTER — Ambulatory Visit (INDEPENDENT_AMBULATORY_CARE_PROVIDER_SITE_OTHER): Payer: Medicare Other | Admitting: Family Medicine

## 2020-09-14 VITALS — BP 110/50 | HR 88 | Temp 98.0°F | Resp 16 | Ht 60.0 in | Wt 180.4 lb

## 2020-09-14 DIAGNOSIS — R413 Other amnesia: Secondary | ICD-10-CM | POA: Diagnosis not present

## 2020-09-14 DIAGNOSIS — M5090 Cervical disc disorder, unspecified, unspecified cervical region: Secondary | ICD-10-CM

## 2020-09-14 DIAGNOSIS — M542 Cervicalgia: Secondary | ICD-10-CM | POA: Diagnosis not present

## 2020-09-14 DIAGNOSIS — R2689 Other abnormalities of gait and mobility: Secondary | ICD-10-CM

## 2020-09-14 NOTE — Patient Instructions (Addendum)
Follow up as scheduled Go to [facility] to get your xray done [facility] call you with your Neuro Psych testing and Neuro Rehab (balance rehab) appts Continue to engage your brain w/ word games, number puzzles, etc Once we know what's going on with your neck, we can decide the next step Call with any questions or concerns Hang in there!

## 2020-09-14 NOTE — Progress Notes (Signed)
   Subjective:    Patient ID: Tracy [name], female    DOB: [birth year], 75 y.o.   MRN: [id number]  HPI Memory loss- 'it's horrible'.  Feels that memory is 'deteriorating at a fast rate'.  Pt reports she has issues w/ names, places, locating objects.  Pt reports she will open the freezer to find garbage bags.  Pt felt the Prevagen was helpful  HA- L sided, notes that she will have sharp pain in L eye and L ear.  Says she will have pain in her neck on the L side at the same.  Pain will also radiate down her L arm.  Currently has occipital HA.  Pt reports there are times she is unable to lie on L side of head due to pain.  Pt reports the week of the 10th sxs were severe but this week things seem to be a bit better.  Was not able to tolerate higher dose of gabapentin due to sedation. Pt has known degenerative disc in neck.  Off balance- pt reports when she stands she will feel 'unsteady' and wobble like she was hit by a wave.  Tends to lose her balance which necessitates her to use her cane.   Review of Systems For ROS see HPI   This visit occurred during the SARS-CoV-2 public health emergency.  Safety protocols were in place, including screening questions prior to the visit, additional usage of staff PPE, and extensive cleaning of exam room while observing appropriate contact time as indicated for disinfecting solutions.       Objective:   Physical Exam Constitutional:      General: She is not in acute distress.    Appearance: She is well-developed. She is obese. She is not ill-appearing.  HENT:     Head: Normocephalic and atraumatic.  Eyes:     Extraocular Movements: Extraocular movements intact.     Right eye: Normal extraocular motion and no nystagmus.     Left eye: Normal extraocular motion and no nystagmus.     Pupils: Pupils are equal, round, and reactive to light.  Cardiovascular:     Rate and Rhythm: Normal rate and regular rhythm.  Musculoskeletal:     Cervical back: No  rigidity.  Lymphadenopathy:     Cervical: No cervical adenopathy.  Neurological:     Mental Status: She is alert and oriented to person, place, and time.     Cranial Nerves: No cranial nerve deficit or facial asymmetry.  Psychiatric:        Mood and Affect: Mood normal.        Speech: Speech normal.        Behavior: Behavior normal.           Assessment & Plan:  Memory loss- new.  Pt reports things are changing at a fast rate.  Unclear if this is age related, anxiety related, medication related, or some other process.  Will refer for Neuropsych testing and determine next steps.  Pt expressed understanding and is in agreement w/ plan.   Abnormality of gait due to balance issues- she has seen neuro for this issue and no obvious cause determined.  To work on her balance and gait will refer to neuro-rehab.  Pt expressed understanding and is in agreement w/ plan.

## 2020-09-18 ENCOUNTER — Other Ambulatory Visit: Payer: Self-pay

## 2020-09-18 DIAGNOSIS — M5416 Radiculopathy, lumbar region: Secondary | ICD-10-CM | POA: Diagnosis not present

## 2020-09-18 DIAGNOSIS — M79672 Pain in left foot: Secondary | ICD-10-CM | POA: Diagnosis not present

## 2020-09-18 DIAGNOSIS — M5090 Cervical disc disorder, unspecified, unspecified cervical region: Secondary | ICD-10-CM

## 2020-09-20 NOTE — Progress Notes (Signed)
HEMATOLOGY/ONCOLOGY CLINIC NOTE  Date of Service: 09/20/2020  Patient Care Team: [name], MD as PCP - General (Family Medicine) [name], MD as Attending Physician (Physical Medicine and Rehabilitation) [name], MD as Consulting Physician (Orthopedic Surgery) [name], MD as Consulting Physician (Otolaryngology) [name], MD as Consulting Physician (Gastroenterology) [name], MD as Consulting Physician (General Surgery) [name], [facility] as Pharmacist (Pharmacist)  CHIEF COMPLAINTS/PURPOSE OF CONSULTATION:  Multiple Myeloma not having achieved remission  HISTORY OF PRESENTING ILLNESS:  Tracy [name] is a wonderful 75 y.o. female who has been referred to [place] by Dr [name] for evaluation and management of Multiple Myeloma. Pt is accompanied today by her daughter in person and other daughter and granddaughter via phone. The pt reports that she is doing well overall.   When pt was first diagnosed with Breast Cancer it was localized in both of her breasts and was considered Stage 1. She then received chemotherapy. However, she declined chemotherapy a second time and chose radiation therapy after recurrence. Pt has been under the surveillance of Dr. [name] after declining antiestrogen therapy.   The pt reports that she was having difficulty breathing so she saw her Pulmonologist, who ordered the work up. Based on the results of the PET/CT pt was sent to Dr. [name] as they were concerned that her breast cancer had spread. Pt has been becoming increasingly anemic over the last year, despite using a PO Iron supplement. She has had a positive Fecal Occult tests, but has been having issues with hemorrhoids and hemorrhoidal bleeding for the last 5-6 months. Pt denies any blood in her stools but notices blood on the tissue after she wipes. Pt has also been having some discomfort in her left hip and lower back. This pain is worsened when she sits down.    Pt has had a chronic cough that has been thought to either be Bronchitis or a result of acid reflux. Pt is planning to receive an Endoscopy to work this up further. She is currently taking Gabapentin for tingling/burning in her extremities. Pt has Type II Diabetes and was previously using Metformin, but discontinued due to concern for liver damage. She is not currently using any medications to control her Diabetes.   Pt had a heart attack in 2001. She initially though that she had a [employer], until the sensation began to travel up her body. She was given Nitroglycerin upon admittance to the hospital. Pt did not require any stents or other interventions. No cause for her heart attack was ever discovered. Pt has been seen by Neurology, Dr. Leta Baptist, who completed imaging on her neck and found a degeneratve disk. Pt has not had a nerve study conducted. Pt has no history of Shingles and has not received the Shingles vaccine. She has been fully vaccinated against he COVID19 virus.    Of note prior to the patient's visit today, pt has had PET/CT ([id number]) completed on 01/20/2020 with results revealing "1. Hypermetabolism corresponding to left acetabular and L5 lytic lesions, with differential considerations of metastatic disease or myeloma. 2. No evidence of hypermetabolic soft tissue primary, metastatic disease, or soft tissue myeloma. 3. Incidental findings, including: Aortic atherosclerosis (ICD10-I70.0) and emphysema (ICD10-J43.9). Hepatic steatosis."   Pt has had Bone Marrow Surgical Pathology ([id number]) completed on 01/17/2020 which revealed "BONE MARROW, ASPIRATE, CLOT, CORE: -Hypercellular bone marrow for age with plasma cell neoplasm PERIPHERAL BLOOD: -Normocytic-normochromic anemia"  Pt has had Left Ischium Biopsy Surgical Pathology  Report [id number]) completed on 01/17/2020 which revealed "Plasma cell neoplasm."  Most recent lab results (01/25/2020) of CBC is as follows:  all values are WNL except for RBC at 2.44, Hgb at 7.5, HCT at 24.0, CO2 at 21, Glucose at 129, Creatinine at 1.38, Total Protein at 10.1, Albumin at 2.8, Total Bilirubin at <0.2, GFR Est Afr Am at 43. 01/25/2020 K/L light chains is as follows: Kappa free light chain at 22.7, Lamda free light chains at 17.7, K/L light chain ratio at 1.28 01/25/2020 MMP is as follows: all values are WNL except for IgG at 5220, Total Protein at 10.1, Alpha2 Glob at 1.2, Gamma Glob at 3.9, M Protein at 3.7, Total Globulin at 6.7, Albumin/Glob at 0.6. 01/25/2020 24-hr UPEP shows all values are WNL  On review of systems, pt reports lower back/left hip pain, unexpected weight loss, abdominal pain, loose stools and denies low appetite, constipation, rashes and any other symptoms.   On PMHx the pt reports Breast Cancer, Breast Lumpectomy, TIA, Restless leg, Myocardial infarction, HTN, HLD, Chronic Bronchitis.  INTERVAL HISTORY:  Tracy [name] is a wonderful 75 y.o. female who is here for evaluation and management of Multiple Myeloma. She is here for C9D8 Daratumumab, Carfilzomib, Dexamethasone treatment. The patient's last visit with [place] was on 08/31/2020. The pt reports that she is doing well overall. She is getting Daratumumab q2weeks and Carfilzomib D1,D8,D15 of every 28 day cycle.  The pt reports that she has no new concerns or symptoms. The pt notes no problems tolerating the treatment. The pt notes that there was no blood loss found on her endoscopy and colonoscopy, also denying any bloody or black stools. The pt notes her hemorrhoid is always tender and could be the cause of the blood loss. The pt notes she has been experiencing more lightheadedness and dizziness, as well as fatigue. She notes this could be due to the disk in her back and notes it is more imbalance. The pt notes that she still does not have her taste buds back, noted with Jelly [employer] and other foods. The pt notes that she feels as though her legs are  swollen even when they are not.   Lab results today 09/21/2020 of CBC w/diff and CMP is as follows: all values are WNL except for RBC of 2.38, Hgb of 7.7, HCT of 23.6, CO2 of 18, Creatinine of 1.34, Calcium of 8.2, AST of 12, GFR est of 41. 09/21/2020 MMP -shows M spike of 0.2.  On review of systems, pt reports imbalance, fatigue and denies lightheadedness, dizziness, SOB, chest pain, back pain, abdominal pain, le swelling, and any other symptoms.  MEDICAL HISTORY:  Past Medical History:  Diagnosis Date   Angiodysplasia of intestine 08/23/2020   Anxiety    Arthritis    Breast cancer ([place]) 06/13/15   Cancer ([place]) 2000   breast cancer   Chronic bronchitis ([place])    Chronic bronchitis ([place])    Hyperlipidemia    Hypertension    Myocardial infarction [facility]) 2001   Personal history of radiation therapy    Restless leg    Stroke ([place]) 2004   TIA, no deficits    SURGICAL HISTORY: Past Surgical History:  Procedure Laterality Date   ABDOMINAL HYSTERECTOMY  1985   BREAST LUMPECTOMY Left 2000   radiation and chemo   BREAST LUMPECTOMY Right 2016   radiation   BREAST SURGERY  2001   lt breast lumpectomy   COLONOSCOPY WITH ESOPHAGOGASTRODUODENOSCOPY (EGD)  04/2020   GIVENS  CAPSULE STUDY  07/2020   IR IMAGING GUIDED PORT INSERTION  04/25/2020   RADIOACTIVE SEED GUIDED PARTIAL MASTECTOMY WITH AXILLARY SENTINEL LYMPH NODE BIOPSY Right 07/07/2015   Procedure: RIGHT RADIOACTIVE SEED GUIDED PARTIAL MASTECTOMY WITH AXILLARY SENTINEL LYMPH NODE BIOPSY;  Surgeon: Autumn Messing III, MD;  Location: [facility];  Service: General;  Laterality: Right;   SMALL INTESTINE SURGERY     TUBAL LIGATION      SOCIAL HISTORY: Social History   Socioeconomic History   Marital status: Divorced    Spouse name: Not on file   Number of children: 7   Years of education: Not on file   Highest education level: Not on file  Occupational History   Occupation: retired  Tobacco  Use   Smoking status: Former Smoker    Packs/day: 1.00    Years: 20.00    Pack years: 20.00    Types: Cigarettes    Quit date: 07/30/2011    Years since quitting: 9.1   Smokeless tobacco: Never Used   Tobacco comment: Quit >4 years ago; 1 ppd for about 5/20 years (remaining was less)  Vaping Use   Vaping Use: Former  Substance and Sexual Activity   Alcohol use: No    Alcohol/week: 0.0 standard drinks   Drug use: No   Sexual activity: Not Currently  Other Topics Concern   Not on file  Social History Narrative   Lives alone.  Retired.  Education:  11th grade GED.  Children:  7 (one here).    Social Determinants of Health   Financial Resource Strain: Low Risk    Difficulty of Paying Living Expenses: Not hard at all  Food Insecurity: No Food Insecurity   Worried About Charity fundraiser in the Last Year: Never true   [place] in the Last Year: Never true  Transportation Needs: No Transportation Needs   Lack of Transportation (Medical): No   Lack of Transportation (Non-Medical): No  Physical Activity: Inactive   Days of Exercise per Week: 0 days   Minutes of Exercise per Session: 0 min  Stress: No Stress Concern Present   Feeling of Stress : Not at all  Social Connections: Moderately Isolated   Frequency of Communication with Friends and Family: More than three times a week   Frequency of Social Gatherings with Friends and Family: Once a week   Attends Religious Services: 1 to 4 times per year   Active Member of [employer] or Organizations: No   Attends Music therapist: Never   Marital Status: Divorced  Human resources officer Violence: Not At Risk   Fear of Current or Ex-Partner: No   Emotionally Abused: No   Physically Abused: No   Sexually Abused: No    FAMILY HISTORY: Family History  Problem Relation Age of Onset   Diabetes Father    Lung cancer Sister        dx. <50; former smoker   Diabetes Brother    Diabetes Paternal  Aunt    Stroke Maternal Grandmother    Diabetes Paternal Grandmother    Emphysema Mother 16       smoker   Diabetes Brother    Brain cancer Brother 66       unknown tumor type   Cancer Daughter 74       neck cancer   Other Daughter        hysterectomy for unspecified reason   Breast cancer Cousin    Cancer Cousin  unspecified type   Breast cancer Other        triple negative breast cancer in her 13s   Cancer Daughter    Colon polyps Neg Hx    Esophageal cancer Neg Hx    Gallbladder disease Neg Hx     ALLERGIES:  is allergic to bacitracin-neomycin-polymyxin  [neomycin-bacitracin zn-polymyx], nsaids, tape, ambien [zolpidem tartrate], amoxicillin, contrast media [iodinated diagnostic agents], latex, and prednisone.  MEDICATIONS:  Current Outpatient Medications  Medication Sig Dispense Refill   Accu-Chek Softclix Lancets lancets Use as instructed to check sugars 1-2 times daily. 100 each 12   acyclovir (ZOVIRAX) 400 MG tablet Take 1 tablet (400 mg total) by mouth 2 (two) times daily. 60 tablet 11   albuterol (VENTOLIN HFA) 108 (90 Base) MCG/ACT inhaler Inhale 2 puffs into the lungs every 6 (six) hours as needed for wheezing or shortness of breath. 8 g 2   aspirin 81 MG tablet Take 81 mg by mouth daily.     atorvastatin (LIPITOR) 10 MG tablet TAKE 1 TABLET BY MOUTH  DAILY 90 tablet 3   dexamethasone (DECADRON) 4 MG tablet Take 3 tabs with breakfast the day after each treatment. 20 tablet 4   ergocalciferol (VITAMIN D2) 1.25 MG (50000 UT) capsule Take 1 capsule (50,000 Units total) by mouth once a week. 12 capsule 2   fexofenadine (ALLEGRA) 60 MG tablet Take 60 mg by mouth daily.     fluticasone (FLOVENT HFA) 110 MCG/ACT inhaler Inhale 2 puffs into the lungs in the morning and at bedtime. 1 each 12   gabapentin (NEURONTIN) 300 MG capsule TAKE 1 CAPSULE BY MOUTH 3  TIMES DAILY 270 capsule 3   glucose blood (ACCU-CHEK GUIDE) test strip Use as instructed to  check sugars 1-2 times daily. 100 each 12   lidocaine-prilocaine (EMLA) cream Apply 1 application topically as needed. 30 g 0   losartan (COZAAR) 100 MG tablet TAKE 1 TABLET BY MOUTH  DAILY 90 tablet 3   metFORMIN (GLUCOPHAGE) 500 MG tablet TAKE 1 TABLET(500 MG) BY MOUTH TWICE DAILY WITH A MEAL 180 tablet 0   mometasone (NASONEX) 50 MCG/ACT nasal spray Place 2 sprays into the nose daily. (Patient taking differently: Place 2 sprays into the nose as needed.) 17 g 12   ondansetron (ZOFRAN) 8 MG tablet Take 1 tablet (8 mg total) by mouth 2 (two) times daily as needed (Nausea or vomiting). 30 tablet 1   pantoprazole (PROTONIX) 40 MG tablet TAKE 1 TABLET BY MOUTH  TWICE DAILY 180 tablet 1   potassium chloride SA (KLOR-CON) 20 MEQ tablet Take 1 tablet (20 mEq total) by mouth 2 (two) times daily. 60 tablet 1   prochlorperazine (COMPAZINE) 10 MG tablet Take 1 tablet (10 mg total) by mouth every 6 (six) hours as needed (Nausea or vomiting). 30 tablet 1   tiZANidine (ZANAFLEX) 4 MG tablet TAKE 1 TABLET(4 MG) BY MOUTH AT BEDTIME (Patient taking differently: Take 4 mg by mouth at bedtime.) 30 tablet 3   traZODone (DESYREL) 100 MG tablet TAKE 1 TABLET BY MOUTH AT  BEDTIME 90 tablet 3   No current facility-administered medications for this visit.    REVIEW OF SYSTEMS:   10 Point review of Systems was done is negative except as noted above.  PHYSICAL EXAMINATION: ECOG PERFORMANCE STATUS: 2 - Symptomatic, <50% confined to bed  Exam was given in a wheelchair.   GENERAL:alert, in no acute distress and comfortable SKIN: no acute rashes, no significant lesions EYES: conjunctiva are  pink and non-injected, sclera anicteric OROPHARYNX: MMM, no exudates, no oropharyngeal erythema or ulceration NECK: supple, no JVD LYMPH:  no palpable lymphadenopathy in the cervical, axillary or inguinal regions LUNGS: clear to auscultation b/l with normal respiratory effort HEART: regular rate & rhythm ABDOMEN:   normoactive bowel sounds , non tender, not distended. Extremity: no pedal edema PSYCH: alert & oriented x 3 with fluent speech NEURO: no focal motor/sensory deficits  LABORATORY DATA:  I have reviewed the data as listed  . CBC Latest Ref Rng & Units 09/21/2020 09/07/2020 08/31/2020  WBC 4.0 - 10.5 K/uL 4.3 4.6 4.5  Hemoglobin 12.0 - 15.0 g/dL 7.7(L) 8.1(L) 8.3(L)  Hematocrit 36.0 - 46.0 % 23.6(L) 24.2(L) 24.9(L)  Platelets 150 - 400 K/uL 287 191 215    . CMP Latest Ref Rng & Units 09/21/2020 09/07/2020 08/31/2020  Glucose 70 - 99 mg/dL 98 88 74  BUN 8 - 23 mg/dL '18 22 18  ' Creatinine 0.44 - 1.00 mg/dL 1.34(H) 1.48(H) 1.41(H)  Sodium 135 - 145 mmol/L 137 140 140  Potassium 3.5 - 5.1 mmol/L 4.8 4.7 4.5  Chloride 98 - 111 mmol/L 110 111 110  CO2 22 - 32 mmol/L 18(L) 20(L) 21(L)  Calcium 8.9 - 10.3 mg/dL 8.2(L) 9.2 8.5(L)  Total Protein 6.5 - 8.1 g/dL 6.5 6.5 6.4(L)  Total Bilirubin 0.3 - 1.2 mg/dL 0.3 0.3 0.3  Alkaline Phos 38 - 126 U/L 73 68 68  AST 15 - 41 U/L 12(L) 10(L) 11(L)  ALT 0 - 44 U/L '12 10 12     ' 05/16/2020 Upper Endoscopy:   05/16/2020 Colonoscopy:   01/25/2020 K/L light chains:    01/25/2020 MMP:            RADIOGRAPHIC STUDIES: I have personally reviewed the radiological images as listed and agreed with the findings in the report. DG Cervical Spine Complete  Result Date: 09/16/2020 CLINICAL DATA:  Cervical degenerative disc disease. Sharp pain in left eye, left ear, and headache. Neck pain. EXAM: CERVICAL SPINE - COMPLETE 4+ VIEW COMPARISON:  None. FINDINGS: The pre odontoid space and prevertebral soft tissues are normal. No malalignment. Multilevel degenerative disc disease with anterior osteophytes. There are a few small posterior osteophytes as well. The right-sided neural foramina are patent on oblique imaging. No critical stenosis of the left-sided neural foramina on oblique imaging. There is mild narrowing of and upper left neural foramen with small  osteophytes. Uncovertebral degenerative changes are identified. The lateral masses of C1 align with C2. The odontoid process is unremarkable. Limited views of the lung apices are normal. IMPRESSION: Degenerative changes as above. Electronically Signed   By: Dorise Bullion III M.D   On: 09/16/2020 17:16   MM DIAG BREAST TOMO BILATERAL  Result Date: 08/25/2020 CLINICAL DATA:  75 year old female with remote malignant left lumpectomy and malignant right lumpectomy in 2016. EXAM: DIGITAL DIAGNOSTIC BILATERAL MAMMOGRAM WITH TOMO AND CAD TECHNIQUE: Bilateral digital diagnostic mammography and breast tomosynthesis was performed. Digital images of the breasts were evaluated with computer-aided detection. COMPARISON:  Previous exam(s). ACR Breast Density Category b: There are scattered areas of fibroglandular density. FINDINGS: Stable post lumpectomy changes noted in the bilateral upper outer quadrants. No new or suspicious findings in either breast. The parenchymal pattern is stable. IMPRESSION: 1. No mammographic evidence of malignancy in either breast. 2. Stable bilateral posttreatment changes. RECOMMENDATION: Screening mammogram in one year.(Code:SM-B-01Y) I have discussed the findings and recommendations with the patient. If applicable, a reminder letter will be sent to the patient  regarding the next appointment. BI-RADS CATEGORY  2: Benign. Electronically Signed   By: Kristopher Oppenheim M.D.   On: 08/25/2020 11:53   08/08/2020 Capsule Endoscopy   ASSESSMENT & PLAN:   75 yo with   1) Newly diagnosed IgG Kappa Multiple myeloma with bone lesions, anemia, renal insuff. M spike @ 3.7g/dl on diagnosis. 1p deletion, polymorphic variant, 13q deletion 2) h/o Dm2 3) Diabetic Neuropathy 4) CKD - likely from DM2, but could have an element of myeloma kidney. 5) h/o TIA and AMI 6) Iron deficiency   PLAN: -Discussed pt labwork today, 09/21/2020; worsening anemia, Hgb decreased. MMP pending. -Discussed Car T cell  therapies and bi-specific antibodies and advancements in Multiple Myeloma treatments.  -Discussed last MMP; m protein at 0.2 from start of 3.7. Pt has met the primary goal of 90% reduction / VGPR. -Advised pt that at this time we cannot attribute anemia to the myeloma.  -Advised pt that the small blood vessels found on capsule endoscopy could be the reason for blood loss and anemia. -Will get PET CT and Bm Bx after this cycle. Discussed options of transplant versus direct maintenance treatment following. The pt still does not desire a transplant. Will proceed with maintenance Daratummumab. Will try to switch in 1-2 months following scans and labs.  -Discussed goal of having the longest progression free period. Advised pt that maintenance treatment is a given regardless of transplant. This is not curable but we are trying to have the longest time of progression. -Advised pt that some of these medications could be secreted into the saliva and affect the taste buds. Should return quickly following completion of medications. -Advised pt that radiculopathy can cause feeling of swollen legs due to pinched nerve in back. -Discussed m protein and how to measure cancerous cells remaining. Positive Correlation between less cancerous cells and lower m protein. -Continue [place] Vitamin B12 q2weeks initially then q16monthfollowing.  -The pt has no prohibitive toxicities from continuing C9D8 Daratumumab + Carfilzomib necessitating treatment change at this time. -Will proceed with Carfilzomib at 56 mg/m^2 today. -Continue Zometa. -Will get 1 unit PRBC in 1-2 days. -PET CT and BM Bx in 2 weeks. -Will see back in 4 weeks with C10D1.   FOLLOW UP: Please schedule remaining cycle 9 of daratumumab carfilzomib as per orders. Port flush and labs with each treatment. PRBC 1 unit transfusion soon as possible within 1 to 2 days. Please schedule cycle 10 of daratumumab and carfilzomib as per orders. Port flush and labs with  each treatment. MD visit with cycle 10-day 1 in 4 weeks. PET CT scan in the next 1 to 2 weeks CT bone marrow biopsy in 1 to 2 weeks     The total time spent in the appointment was 40 minutes and more than 50% was on counseling and direct patient cares.  All of the patient's questions were answered with apparent satisfaction. The patient knows to call the clinic with any problems, questions or concerns.   GSullivan LoneMD MBeverlyAAHIVMS SSelect Specialty Hospital - Tulsa/MidtownCGastrointestinal Healthcare PaHematology/Oncology Physician CCastle Ambulatory Surgery Center LLC (Office):       [phone](Work cell):  [phone](Fax):           [phone] 09/20/2020 5:58 PM   I, RReinaldo Raddle am acting as scribe for Dr. GSullivan Lone MD.

## 2020-09-21 ENCOUNTER — Inpatient Hospital Stay: Payer: Medicare Other

## 2020-09-21 ENCOUNTER — Other Ambulatory Visit: Payer: Self-pay

## 2020-09-21 ENCOUNTER — Other Ambulatory Visit: Payer: Self-pay | Admitting: *Deleted

## 2020-09-21 ENCOUNTER — Other Ambulatory Visit: Payer: Medicare Other

## 2020-09-21 ENCOUNTER — Inpatient Hospital Stay: Payer: Medicare Other | Admitting: Hematology

## 2020-09-21 VITALS — BP 155/55 | HR 100 | Resp 18

## 2020-09-21 VITALS — BP 138/62 | HR 73 | Temp 97.0°F | Resp 18 | Ht 60.0 in | Wt 182.3 lb

## 2020-09-21 DIAGNOSIS — C9000 Multiple myeloma not having achieved remission: Secondary | ICD-10-CM

## 2020-09-21 DIAGNOSIS — Z95828 Presence of other vascular implants and grafts: Secondary | ICD-10-CM

## 2020-09-21 DIAGNOSIS — Z7189 Other specified counseling: Secondary | ICD-10-CM

## 2020-09-21 DIAGNOSIS — Z5112 Encounter for antineoplastic immunotherapy: Secondary | ICD-10-CM

## 2020-09-21 DIAGNOSIS — Z8673 Personal history of transient ischemic attack (TIA), and cerebral infarction without residual deficits: Secondary | ICD-10-CM | POA: Diagnosis not present

## 2020-09-21 DIAGNOSIS — D649 Anemia, unspecified: Secondary | ICD-10-CM

## 2020-09-21 DIAGNOSIS — Z923 Personal history of irradiation: Secondary | ICD-10-CM | POA: Diagnosis not present

## 2020-09-21 DIAGNOSIS — Z853 Personal history of malignant neoplasm of breast: Secondary | ICD-10-CM | POA: Diagnosis not present

## 2020-09-21 DIAGNOSIS — D509 Iron deficiency anemia, unspecified: Secondary | ICD-10-CM

## 2020-09-21 DIAGNOSIS — E1122 Type 2 diabetes mellitus with diabetic chronic kidney disease: Secondary | ICD-10-CM | POA: Diagnosis not present

## 2020-09-21 DIAGNOSIS — I252 Old myocardial infarction: Secondary | ICD-10-CM | POA: Diagnosis not present

## 2020-09-21 DIAGNOSIS — E1140 Type 2 diabetes mellitus with diabetic neuropathy, unspecified: Secondary | ICD-10-CM | POA: Diagnosis not present

## 2020-09-21 DIAGNOSIS — N189 Chronic kidney disease, unspecified: Secondary | ICD-10-CM | POA: Diagnosis not present

## 2020-09-21 DIAGNOSIS — Z87891 Personal history of nicotine dependence: Secondary | ICD-10-CM | POA: Diagnosis not present

## 2020-09-21 LAB — CMP (CANCER CENTER ONLY)
ALT: 12 U/L (ref 0–44)
AST: 12 U/L — ABNORMAL LOW (ref 15–41)
Albumin: 3.5 g/dL (ref 3.5–5.0)
Alkaline Phosphatase: 73 U/L (ref 38–126)
Anion gap: 9 (ref 5–15)
BUN: 18 mg/dL (ref 8–23)
CO2: 18 mmol/L — ABNORMAL LOW (ref 22–32)
Calcium: 8.2 mg/dL — ABNORMAL LOW (ref 8.9–10.3)
Chloride: 110 mmol/L (ref 98–111)
Creatinine: 1.34 mg/dL — ABNORMAL HIGH (ref 0.44–1.00)
GFR, Estimated: 41 mL/min — ABNORMAL LOW (ref >60)
Glucose, Bld: 98 mg/dL (ref 70–99)
Potassium: 4.8 mmol/L (ref 3.5–5.1)
Sodium: 137 mmol/L (ref 135–145)
Total Bilirubin: 0.3 mg/dL (ref 0.3–1.2)
Total Protein: 6.5 g/dL (ref 6.5–8.1)

## 2020-09-21 LAB — CBC WITH DIFFERENTIAL/PLATELET
Abs Immature Granulocytes: 0.02 10*3/uL (ref 0.00–0.07)
Basophils Absolute: 0 10*3/uL (ref 0.0–0.1)
Basophils Relative: 1 %
Eosinophils Absolute: 0 10*3/uL (ref 0.0–0.5)
Eosinophils Relative: 1 %
HCT: 23.6 % — ABNORMAL LOW (ref 36.0–46.0)
Hemoglobin: 7.7 g/dL — ABNORMAL LOW (ref 12.0–15.0)
Immature Granulocytes: 1 %
Lymphocytes Relative: 25 %
Lymphs Abs: 1 10*3/uL (ref 0.7–4.0)
MCH: 32.4 pg (ref 26.0–34.0)
MCHC: 32.6 g/dL (ref 30.0–36.0)
MCV: 99.2 fL (ref 80.0–100.0)
Monocytes Absolute: 0.3 10*3/uL (ref 0.1–1.0)
Monocytes Relative: 8 %
Neutro Abs: 2.8 10*3/uL (ref 1.7–7.7)
Neutrophils Relative %: 64 %
Platelets: 287 10*3/uL (ref 150–400)
RBC: 2.38 MIL/uL — ABNORMAL LOW (ref 3.87–5.11)
RDW: 14.6 % (ref 11.5–15.5)
WBC: 4.3 10*3/uL (ref 4.0–10.5)
nRBC: 0 % (ref 0.0–0.2)

## 2020-09-21 LAB — SAMPLE TO BLOOD BANK

## 2020-09-21 LAB — PREPARE RBC (CROSSMATCH)

## 2020-09-21 MED ORDER — FAMOTIDINE IN NACL 20-0.9 MG/50ML-% IV SOLN
20.0000 mg | Freq: Once | INTRAVENOUS | Status: AC
  Administered 2020-09-21: 20 mg via INTRAVENOUS

## 2020-09-21 MED ORDER — CYANOCOBALAMIN 1000 MCG/ML IJ SOLN
1000.0000 ug | Freq: Once | INTRAMUSCULAR | Status: AC
  Administered 2020-09-21: 1000 ug via SUBCUTANEOUS

## 2020-09-21 MED ORDER — FAMOTIDINE IN NACL 20-0.9 MG/50ML-% IV SOLN
INTRAVENOUS | Status: AC
  Filled 2020-09-21: qty 50

## 2020-09-21 MED ORDER — SODIUM CHLORIDE 0.9% FLUSH
10.0000 mL | INTRAVENOUS | Status: DC | PRN
  Administered 2020-09-21: 10 mL via INTRAVENOUS
  Filled 2020-09-21: qty 10

## 2020-09-21 MED ORDER — DEXTROSE 5 % IV SOLN
56.0000 mg/m2 | Freq: Once | INTRAVENOUS | Status: AC
  Administered 2020-09-21: 110 mg via INTRAVENOUS
  Filled 2020-09-21: qty 30

## 2020-09-21 MED ORDER — DIPHENHYDRAMINE HCL 25 MG PO CAPS
ORAL_CAPSULE | ORAL | Status: AC
  Filled 2020-09-21: qty 2

## 2020-09-21 MED ORDER — SODIUM CHLORIDE 0.9 % IV SOLN
20.0000 mg | Freq: Once | INTRAVENOUS | Status: AC
  Administered 2020-09-21: 20 mg via INTRAVENOUS
  Filled 2020-09-21: qty 20

## 2020-09-21 MED ORDER — DIPHENHYDRAMINE HCL 25 MG PO CAPS
50.0000 mg | ORAL_CAPSULE | Freq: Once | ORAL | Status: AC
  Administered 2020-09-21: 50 mg via ORAL

## 2020-09-21 MED ORDER — SODIUM CHLORIDE 0.9% FLUSH
10.0000 mL | INTRAVENOUS | Status: DC | PRN
  Administered 2020-09-21: 10 mL
  Filled 2020-09-21: qty 10

## 2020-09-21 MED ORDER — SODIUM CHLORIDE 0.9 % IV SOLN
Freq: Once | INTRAVENOUS | Status: AC
  Filled 2020-09-21: qty 250

## 2020-09-21 MED ORDER — DARATUMUMAB CHEMO INJECTION 400 MG/20ML
16.0000 mg/kg | Freq: Once | INTRAVENOUS | Status: AC
  Administered 2020-09-21: 1400 mg via INTRAVENOUS
  Filled 2020-09-21: qty 60

## 2020-09-21 MED ORDER — ACETAMINOPHEN 325 MG PO TABS
ORAL_TABLET | ORAL | Status: AC
  Filled 2020-09-21: qty 2

## 2020-09-21 MED ORDER — ACETAMINOPHEN 325 MG PO TABS
650.0000 mg | ORAL_TABLET | Freq: Once | ORAL | Status: AC
  Administered 2020-09-21: 650 mg via ORAL

## 2020-09-21 MED ORDER — HEPARIN SOD (PORK) LOCK FLUSH 100 UNIT/ML IV SOLN
500.0000 [IU] | Freq: Once | INTRAVENOUS | Status: AC | PRN
  Administered 2020-09-21: 500 [IU]
  Filled 2020-09-21: qty 5

## 2020-09-21 MED ORDER — CYANOCOBALAMIN 1000 MCG/ML IJ SOLN
INTRAMUSCULAR | Status: AC
  Filled 2020-09-21: qty 1

## 2020-09-21 NOTE — Patient Instructions (Signed)
[facility] Discharge Instructions for Patients Receiving Chemotherapy  Today you received the following chemotherapy agents: Carfilzomib (Kyprolis) and Daratumumab (Darzalex)  To help prevent nausea and vomiting after your treatment, we encourage you to take your nausea medication  as prescribed.    If you develop nausea and vomiting that is not controlled by your nausea medication, call the clinic.   BELOW ARE SYMPTOMS THAT SHOULD BE REPORTED IMMEDIATELY:  *FEVER GREATER THAN 100.5 F  *CHILLS WITH OR WITHOUT FEVER  NAUSEA AND VOMITING THAT IS NOT CONTROLLED WITH YOUR NAUSEA MEDICATION  *UNUSUAL SHORTNESS OF BREATH  *UNUSUAL BRUISING OR BLEEDING  TENDERNESS IN MOUTH AND THROAT WITH OR WITHOUT PRESENCE OF ULCERS  *URINARY PROBLEMS  *BOWEL PROBLEMS  UNUSUAL RASH Items with * indicate a potential emergency and should be followed up as soon as possible.  Feel free to call the clinic should you have any questions or concerns. The clinic phone number is [phone].  Please show the [facility] at check-in to the Emergency Department and triage nurse.

## 2020-09-21 NOTE — Progress Notes (Signed)
Verbal order per Dr. Irene Limbo - transfuse 1 unit PRBCs for HGB 7.7 Orders entered - confirmed with [place] in [facility].

## 2020-09-21 NOTE — Progress Notes (Signed)
Per Dr. Irene Limbo- okay to treat with hemoglobin of 7.7. Patient to receive 1 unit of blood sometime this week.

## 2020-09-21 NOTE — Patient Instructions (Signed)

## 2020-09-23 ENCOUNTER — Other Ambulatory Visit: Payer: Self-pay

## 2020-09-23 ENCOUNTER — Inpatient Hospital Stay: Payer: Medicare Other

## 2020-09-23 VITALS — BP 149/61 | HR 78 | Temp 99.1°F | Resp 17

## 2020-09-23 DIAGNOSIS — C9000 Multiple myeloma not having achieved remission: Secondary | ICD-10-CM | POA: Diagnosis not present

## 2020-09-23 DIAGNOSIS — E1122 Type 2 diabetes mellitus with diabetic chronic kidney disease: Secondary | ICD-10-CM | POA: Diagnosis not present

## 2020-09-23 DIAGNOSIS — Z95828 Presence of other vascular implants and grafts: Secondary | ICD-10-CM

## 2020-09-23 DIAGNOSIS — E1140 Type 2 diabetes mellitus with diabetic neuropathy, unspecified: Secondary | ICD-10-CM | POA: Diagnosis not present

## 2020-09-23 DIAGNOSIS — D649 Anemia, unspecified: Secondary | ICD-10-CM

## 2020-09-23 DIAGNOSIS — Z8673 Personal history of transient ischemic attack (TIA), and cerebral infarction without residual deficits: Secondary | ICD-10-CM | POA: Diagnosis not present

## 2020-09-23 DIAGNOSIS — Z923 Personal history of irradiation: Secondary | ICD-10-CM | POA: Diagnosis not present

## 2020-09-23 DIAGNOSIS — D509 Iron deficiency anemia, unspecified: Secondary | ICD-10-CM

## 2020-09-23 DIAGNOSIS — Z87891 Personal history of nicotine dependence: Secondary | ICD-10-CM | POA: Diagnosis not present

## 2020-09-23 DIAGNOSIS — Z853 Personal history of malignant neoplasm of breast: Secondary | ICD-10-CM | POA: Diagnosis not present

## 2020-09-23 DIAGNOSIS — N189 Chronic kidney disease, unspecified: Secondary | ICD-10-CM | POA: Diagnosis not present

## 2020-09-23 DIAGNOSIS — I252 Old myocardial infarction: Secondary | ICD-10-CM | POA: Diagnosis not present

## 2020-09-23 DIAGNOSIS — Z5112 Encounter for antineoplastic immunotherapy: Secondary | ICD-10-CM | POA: Diagnosis not present

## 2020-09-23 MED ORDER — SODIUM CHLORIDE 0.9% FLUSH
10.0000 mL | INTRAVENOUS | Status: AC | PRN
  Administered 2020-09-23: 10 mL
  Filled 2020-09-23: qty 10

## 2020-09-23 MED ORDER — SODIUM CHLORIDE 0.9% FLUSH
10.0000 mL | Freq: Once | INTRAVENOUS | Status: DC
  Filled 2020-09-23: qty 10

## 2020-09-23 MED ORDER — METHYLPREDNISOLONE SODIUM SUCC 40 MG IJ SOLR
40.0000 mg | Freq: Once | INTRAMUSCULAR | Status: AC
  Administered 2020-09-23: 40 mg via INTRAVENOUS

## 2020-09-23 MED ORDER — HEPARIN SOD (PORK) LOCK FLUSH 100 UNIT/ML IV SOLN
500.0000 [IU] | Freq: Once | INTRAVENOUS | Status: DC
  Filled 2020-09-23: qty 5

## 2020-09-23 MED ORDER — HEPARIN SOD (PORK) LOCK FLUSH 100 UNIT/ML IV SOLN
250.0000 [IU] | INTRAVENOUS | Status: DC | PRN
  Filled 2020-09-23: qty 5

## 2020-09-23 MED ORDER — HEPARIN SOD (PORK) LOCK FLUSH 100 UNIT/ML IV SOLN
500.0000 [IU] | Freq: Every day | INTRAVENOUS | Status: AC | PRN
  Administered 2020-09-23: 500 [IU]
  Filled 2020-09-23: qty 5

## 2020-09-23 MED ORDER — METHYLPREDNISOLONE SODIUM SUCC 40 MG IJ SOLR
INTRAMUSCULAR | Status: AC
  Filled 2020-09-23: qty 1

## 2020-09-23 MED ORDER — ACETAMINOPHEN 325 MG PO TABS
650.0000 mg | ORAL_TABLET | Freq: Once | ORAL | Status: AC
  Administered 2020-09-23: 650 mg via ORAL

## 2020-09-23 MED ORDER — ACETAMINOPHEN 325 MG PO TABS
ORAL_TABLET | ORAL | Status: AC
  Filled 2020-09-23: qty 2

## 2020-09-23 NOTE — Patient Instructions (Signed)

## 2020-09-24 LAB — TYPE AND SCREEN
ABO/RH(D): A POS
Antibody Screen: POSITIVE
Unit division: 0

## 2020-09-24 LAB — BPAM RBC
Blood Product Expiration Date: 202203222359
ISSUE DATE / TIME: 202202261044
Unit Type and Rh: 6200

## 2020-09-24 NOTE — Assessment & Plan Note (Signed)
Ongoing issue for pt.  Suspect this has deteriorated over time and is contributing to her headaches and the sharp pains she is having.  Will get neck xrays to assess for worsening degeneration and determine the next steps based on the results.  She already sees neuro and they have not been able to determine a cause of her HAs, dizziness, pain.  Will continue to follow.

## 2020-09-25 ENCOUNTER — Other Ambulatory Visit: Payer: Self-pay | Admitting: Hematology and Oncology

## 2020-09-25 ENCOUNTER — Other Ambulatory Visit: Payer: Self-pay | Admitting: Hematology

## 2020-09-25 ENCOUNTER — Encounter: Payer: Self-pay | Admitting: Psychology

## 2020-09-25 DIAGNOSIS — M545 Low back pain, unspecified: Secondary | ICD-10-CM | POA: Diagnosis not present

## 2020-09-25 LAB — MULTIPLE MYELOMA PANEL, SERUM
Albumin SerPl Elph-Mcnc: 3.4 g/dL (ref 2.9–4.4)
Albumin/Glob SerPl: 1.3 (ref 0.7–1.7)
Alpha 1: 0.2 g/dL (ref 0.0–0.4)
Alpha2 Glob SerPl Elph-Mcnc: 1.2 g/dL — ABNORMAL HIGH (ref 0.4–1.0)
B-Globulin SerPl Elph-Mcnc: 0.9 g/dL (ref 0.7–1.3)
Gamma Glob SerPl Elph-Mcnc: 0.4 g/dL (ref 0.4–1.8)
Globulin, Total: 2.7 g/dL (ref 2.2–3.9)
IgA: 49 mg/dL — ABNORMAL LOW (ref 64–422)
IgG (Immunoglobin G), Serum: 449 mg/dL — ABNORMAL LOW (ref 586–1602)
IgM (Immunoglobulin M), Srm: 30 mg/dL (ref 26–217)
M Protein SerPl Elph-Mcnc: 0.2 g/dL — ABNORMAL HIGH
Total Protein ELP: 6.1 g/dL (ref 6.0–8.5)

## 2020-09-25 NOTE — Telephone Encounter (Signed)
Please review for 90 day refill request - thank you

## 2020-09-27 ENCOUNTER — Other Ambulatory Visit: Payer: Self-pay | Admitting: Hematology

## 2020-09-27 DIAGNOSIS — M79672 Pain in left foot: Secondary | ICD-10-CM | POA: Diagnosis not present

## 2020-09-27 DIAGNOSIS — M79671 Pain in right foot: Secondary | ICD-10-CM | POA: Diagnosis not present

## 2020-09-28 ENCOUNTER — Inpatient Hospital Stay: Payer: Medicare Other | Attending: Hematology

## 2020-09-28 ENCOUNTER — Other Ambulatory Visit: Payer: Self-pay

## 2020-09-28 ENCOUNTER — Inpatient Hospital Stay: Payer: Medicare Other

## 2020-09-28 VITALS — BP 168/70 | HR 77 | Temp 98.6°F | Resp 18 | Wt 181.2 lb

## 2020-09-28 DIAGNOSIS — C9000 Multiple myeloma not having achieved remission: Secondary | ICD-10-CM | POA: Diagnosis not present

## 2020-09-28 DIAGNOSIS — E1140 Type 2 diabetes mellitus with diabetic neuropathy, unspecified: Secondary | ICD-10-CM | POA: Diagnosis not present

## 2020-09-28 DIAGNOSIS — D509 Iron deficiency anemia, unspecified: Secondary | ICD-10-CM

## 2020-09-28 DIAGNOSIS — Z5112 Encounter for antineoplastic immunotherapy: Secondary | ICD-10-CM | POA: Insufficient documentation

## 2020-09-28 DIAGNOSIS — Z923 Personal history of irradiation: Secondary | ICD-10-CM | POA: Diagnosis not present

## 2020-09-28 DIAGNOSIS — Z853 Personal history of malignant neoplasm of breast: Secondary | ICD-10-CM | POA: Diagnosis not present

## 2020-09-28 DIAGNOSIS — I252 Old myocardial infarction: Secondary | ICD-10-CM | POA: Diagnosis not present

## 2020-09-28 DIAGNOSIS — E1122 Type 2 diabetes mellitus with diabetic chronic kidney disease: Secondary | ICD-10-CM | POA: Insufficient documentation

## 2020-09-28 DIAGNOSIS — N189 Chronic kidney disease, unspecified: Secondary | ICD-10-CM | POA: Insufficient documentation

## 2020-09-28 DIAGNOSIS — Z95828 Presence of other vascular implants and grafts: Secondary | ICD-10-CM

## 2020-09-28 DIAGNOSIS — Z8673 Personal history of transient ischemic attack (TIA), and cerebral infarction without residual deficits: Secondary | ICD-10-CM | POA: Insufficient documentation

## 2020-09-28 DIAGNOSIS — Z7189 Other specified counseling: Secondary | ICD-10-CM

## 2020-09-28 DIAGNOSIS — Z87891 Personal history of nicotine dependence: Secondary | ICD-10-CM | POA: Diagnosis not present

## 2020-09-28 LAB — CBC WITH DIFFERENTIAL/PLATELET
Abs Immature Granulocytes: 0.01 10*3/uL (ref 0.00–0.07)
Basophils Absolute: 0 10*3/uL (ref 0.0–0.1)
Basophils Relative: 1 %
Eosinophils Absolute: 0.1 10*3/uL (ref 0.0–0.5)
Eosinophils Relative: 2 %
HCT: 27.8 % — ABNORMAL LOW (ref 36.0–46.0)
Hemoglobin: 9.2 g/dL — ABNORMAL LOW (ref 12.0–15.0)
Immature Granulocytes: 0 %
Lymphocytes Relative: 33 %
Lymphs Abs: 1.3 10*3/uL (ref 0.7–4.0)
MCH: 31.5 pg (ref 26.0–34.0)
MCHC: 33.1 g/dL (ref 30.0–36.0)
MCV: 95.2 fL (ref 80.0–100.0)
Monocytes Absolute: 0.4 10*3/uL (ref 0.1–1.0)
Monocytes Relative: 11 %
Neutro Abs: 2.2 10*3/uL (ref 1.7–7.7)
Neutrophils Relative %: 53 %
Platelets: 205 10*3/uL (ref 150–400)
RBC: 2.92 MIL/uL — ABNORMAL LOW (ref 3.87–5.11)
RDW: 16.2 % — ABNORMAL HIGH (ref 11.5–15.5)
WBC: 4 10*3/uL (ref 4.0–10.5)
nRBC: 0 % (ref 0.0–0.2)

## 2020-09-28 LAB — CMP (CANCER CENTER ONLY)
ALT: 12 U/L (ref 0–44)
AST: 13 U/L — ABNORMAL LOW (ref 15–41)
Albumin: 3.5 g/dL (ref 3.5–5.0)
Alkaline Phosphatase: 69 U/L (ref 38–126)
Anion gap: 7 (ref 5–15)
BUN: 14 mg/dL (ref 8–23)
CO2: 21 mmol/L — ABNORMAL LOW (ref 22–32)
Calcium: 9.1 mg/dL (ref 8.9–10.3)
Chloride: 111 mmol/L (ref 98–111)
Creatinine: 1.37 mg/dL — ABNORMAL HIGH (ref 0.44–1.00)
GFR, Estimated: 40 mL/min — ABNORMAL LOW (ref >60)
Glucose, Bld: 81 mg/dL (ref 70–99)
Potassium: 4.4 mmol/L (ref 3.5–5.1)
Sodium: 139 mmol/L (ref 135–145)
Total Bilirubin: 0.3 mg/dL (ref 0.3–1.2)
Total Protein: 6.4 g/dL — ABNORMAL LOW (ref 6.5–8.1)

## 2020-09-28 MED ORDER — FAMOTIDINE IN NACL 20-0.9 MG/50ML-% IV SOLN
20.0000 mg | Freq: Once | INTRAVENOUS | Status: AC
  Administered 2020-09-28: 20 mg via INTRAVENOUS

## 2020-09-28 MED ORDER — SODIUM CHLORIDE 0.9% FLUSH
10.0000 mL | Freq: Once | INTRAVENOUS | Status: AC
  Administered 2020-09-28: 10 mL
  Filled 2020-09-28: qty 10

## 2020-09-28 MED ORDER — ACETAMINOPHEN 325 MG PO TABS
ORAL_TABLET | ORAL | Status: AC
  Filled 2020-09-28: qty 2

## 2020-09-28 MED ORDER — ZOLEDRONIC ACID 4 MG/100ML IV SOLN
INTRAVENOUS | Status: AC
  Filled 2020-09-28: qty 100

## 2020-09-28 MED ORDER — SODIUM CHLORIDE 0.9 % IV SOLN
Freq: Once | INTRAVENOUS | Status: AC
Start: 2020-09-28 — End: 2020-09-28
  Filled 2020-09-28: qty 250

## 2020-09-28 MED ORDER — FAMOTIDINE IN NACL 20-0.9 MG/50ML-% IV SOLN
INTRAVENOUS | Status: AC
  Filled 2020-09-28: qty 50

## 2020-09-28 MED ORDER — DIPHENHYDRAMINE HCL 25 MG PO CAPS
ORAL_CAPSULE | ORAL | Status: AC
  Filled 2020-09-28: qty 1

## 2020-09-28 MED ORDER — SODIUM CHLORIDE 0.9 % IV SOLN
Freq: Once | INTRAVENOUS | Status: DC
  Filled 2020-09-28: qty 250

## 2020-09-28 MED ORDER — DEXTROSE 5 % IV SOLN
56.0000 mg/m2 | Freq: Once | INTRAVENOUS | Status: AC
  Administered 2020-09-28: 110 mg via INTRAVENOUS
  Filled 2020-09-28: qty 10

## 2020-09-28 MED ORDER — SODIUM CHLORIDE 0.9% FLUSH
10.0000 mL | INTRAVENOUS | Status: DC | PRN
  Administered 2020-09-28: 10 mL
  Filled 2020-09-28: qty 10

## 2020-09-28 MED ORDER — ZOLEDRONIC ACID 4 MG/100ML IV SOLN
4.0000 mg | Freq: Once | INTRAVENOUS | Status: AC
  Administered 2020-09-28: 4 mg via INTRAVENOUS

## 2020-09-28 MED ORDER — ACETAMINOPHEN 325 MG PO TABS
650.0000 mg | ORAL_TABLET | Freq: Once | ORAL | Status: AC
  Administered 2020-09-28: 650 mg via ORAL

## 2020-09-28 MED ORDER — HEPARIN SOD (PORK) LOCK FLUSH 100 UNIT/ML IV SOLN
500.0000 [IU] | Freq: Once | INTRAVENOUS | Status: AC | PRN
  Administered 2020-09-28: 500 [IU]
  Filled 2020-09-28: qty 5

## 2020-09-28 MED ORDER — SODIUM CHLORIDE 0.9 % IV SOLN
20.0000 mg | Freq: Once | INTRAVENOUS | Status: AC
  Administered 2020-09-28: 20 mg via INTRAVENOUS
  Filled 2020-09-28: qty 20

## 2020-09-28 MED ORDER — DIPHENHYDRAMINE HCL 25 MG PO CAPS
50.0000 mg | ORAL_CAPSULE | Freq: Once | ORAL | Status: AC
  Administered 2020-09-28: 50 mg via ORAL

## 2020-09-28 NOTE — Patient Instructions (Signed)
Carfilzomib injection What is this medicine? CARFILZOMIB (kar FILZ oh mib) targets a specific protein within cancer cells and stops the cancer cells from growing. It is used to treat multiple myeloma. This medicine may be used for other purposes; ask your health care provider or pharmacist if you have questions. COMMON BRAND NAME(S): KYPROLIS What should I tell my health care provider before I take this medicine? They need to know if you have any of these conditions:  heart disease  history of blood clots  irregular heartbeat  kidney disease  liver disease  lung or breathing disease  an unusual or allergic reaction to carfilzomib, or other medicines, foods, dyes, or preservatives  pregnant or trying to get pregnant  breast-feeding How should I use this medicine? This medicine is for injection or infusion into a vein. It is given by a health care professional in a hospital or clinic setting. Talk to your pediatrician regarding the use of this medicine in children. Special care may be needed. Overdosage: If you think you have taken too much of this medicine contact a poison control center or emergency room at once. NOTE: This medicine is only for you. Do not share this medicine with others. What if I miss a dose? It is important not to miss your dose. Call your doctor or health care professional if you are unable to keep an appointment. What may interact with this medicine? Interactions are not expected. Give your health care provider a list of all the medicines, herbs, non-prescription drugs, or dietary supplements you use. Also tell them if you smoke, drink alcohol, or use illegal drugs. Some items may interact with your medicine. This list may not describe all possible interactions. Give your health care provider a list of all the medicines, herbs, non-prescription drugs, or dietary supplements you use. Also tell them if you smoke, drink alcohol, or use illegal drugs. Some items  may interact with your medicine. What should I watch for while using this medicine? Your condition will be monitored carefully while you are receiving this medicine. Report any side effects. Continue your course of treatment even though you feel ill unless your doctor tells you to stop. You may need blood work done while you are taking this medicine. Do not become pregnant while taking this medicine or for at least 6 months after stopping it. Women should inform their doctor if they wish to become pregnant or think they might be pregnant. There is a potential for serious side effects to an unborn child. Men should not father a child while taking this medicine and for at least 3 months after stopping it. Talk to your health care professional or pharmacist for more information. Do not breast-feed an infant while taking this medicine or for 2 weeks after the last dose. Check with your doctor or health care professional if you get an attack of severe diarrhea, nausea and vomiting, or if you sweat a lot. The loss of too much body fluid can make it dangerous for you to take this medicine. You may get dizzy. Do not drive, use machinery, or do anything that needs mental alertness until you know how this medicine affects you. Do not stand or sit up quickly, especially if you are an older patient. This reduces the risk of dizzy or fainting spells. What side effects may I notice from receiving this medicine? Side effects that you should report to your doctor or health care professional as soon as possible:  allergic reactions like skin  rash, itching or hives, swelling of the face, lips, or tongue  confusion  dizziness  feeling faint or lightheaded  fever or chills  palpitations  seizures  signs and symptoms of bleeding such as bloody or black, tarry stools; red or dark-brown urine; spitting up blood or brown material that looks like coffee grounds; red spots on the skin; unusual bruising or bleeding  including from the eye, gums, or nose  signs and symptoms of a blood clot such as breathing problems; changes in vision; chest pain; severe, sudden headache; pain, swelling, warmth in the leg; trouble speaking; sudden numbness or weakness of the face, arm or leg  signs and symptoms of kidney injury like trouble passing urine or change in the amount of urine  signs and symptoms of liver injury like dark yellow or brown urine; general ill feeling or flu-like symptoms; light-colored stools; loss of appetite; nausea; right upper belly pain; unusually weak or tired; yellowing of the eyes or skin Side effects that usually do not require medical attention (report to your doctor or health care professional if they continue or are bothersome):  back pain  cough  diarrhea  headache  muscle cramps  trouble sleeping  vomiting This list may not describe all possible side effects. Call your doctor for medical advice about side effects. You may report side effects to FDA at 1-800-FDA-1088. Where should I keep my medicine? This drug is given in a hospital or clinic and will not be stored at home. NOTE: This sheet is a summary. It may not cover all possible information. If you have questions about this medicine, talk to your doctor, pharmacist, or health care provider.  2021 Elsevier/Gold Standard (2019-03-22 19:44:21)

## 2020-10-02 ENCOUNTER — Telehealth: Payer: Self-pay

## 2020-10-02 NOTE — Telephone Encounter (Signed)
Pt. called complaining of increased Neuropathic pain in her feet, ankle, and legs making it difficult for her to walk.  Dr. Irene Limbo will see pt. during her treatment on Thursday 10/05/20.

## 2020-10-03 ENCOUNTER — Telehealth: Payer: Self-pay | Admitting: Family Medicine

## 2020-10-03 DIAGNOSIS — M533 Sacrococcygeal disorders, not elsewhere classified: Secondary | ICD-10-CM | POA: Diagnosis not present

## 2020-10-03 DIAGNOSIS — M5416 Radiculopathy, lumbar region: Secondary | ICD-10-CM | POA: Diagnosis not present

## 2020-10-03 NOTE — Telephone Encounter (Signed)
Pt called in stating that she would like it if Dr. Birdie Riddle would change the gabapentin to something else. She states that it is not helping with her feet and leg pain. She states that she doesn't want to come in for an appt because she was just in to see her.   Please advise

## 2020-10-03 NOTE — Telephone Encounter (Signed)
Patient called and stated that the gabapentin is not working and would prefer not to come in for an appt being that she was just seen in office not long ago.

## 2020-10-04 MED ORDER — PREGABALIN 50 MG PO CAPS: 50.0000 mg | ORAL_CAPSULE | Freq: Three times a day (TID) | ORAL | 0 refills | Status: DC

## 2020-10-04 NOTE — Telephone Encounter (Signed)
Spoke with patient she stated having pain in her feet,describes pain as throbbing,aching,burning and tingles at times.Pain radiates to ankles. Her feet feels heavy but not swollen,also they feel very cold. Please advise

## 2020-10-04 NOTE — Telephone Encounter (Signed)
Before I can send something else for the pain, I need to know more about it.  Is it a throbbing, aching, burning, tingling, stabbing, etc?  Is it just at night or during the day as well.

## 2020-10-04 NOTE — Telephone Encounter (Signed)
Spoke with patient and patient stated that her pain is described as follows but not at same time. Sharp, dull, achy, burning, and cold . Patient states that at bed time her feet get really cold. She woke up at 2 am and they felt cold as ice. She also stated that she doubled up on the gabapentin at nigh time but because it has been in her system so long she cannot function and it does not help the pain. Patient states that she took a oxycodone and her feet feels tight and has a dull ache but it has helped.

## 2020-10-04 NOTE — Telephone Encounter (Signed)
Called and spoke with patient. Patient stated that she will pick up Rx and start taking as of today. She also said Thank you!

## 2020-10-04 NOTE — Telephone Encounter (Signed)
She should stop her Gabapentin and switch to Lyrica 50mg  TID.  I sent a new prescription to her pharmacy so she can start when she picks it up.

## 2020-10-04 NOTE — Addendum Note (Signed)
Addended by: [name] on: 10/04/2020 12:02 PM   Modules accepted: Orders

## 2020-10-05 ENCOUNTER — Inpatient Hospital Stay: Payer: Medicare Other

## 2020-10-05 ENCOUNTER — Other Ambulatory Visit: Payer: Self-pay

## 2020-10-05 VITALS — BP 154/50 | HR 73 | Temp 98.4°F | Resp 16

## 2020-10-05 DIAGNOSIS — Z5112 Encounter for antineoplastic immunotherapy: Secondary | ICD-10-CM

## 2020-10-05 DIAGNOSIS — Z8673 Personal history of transient ischemic attack (TIA), and cerebral infarction without residual deficits: Secondary | ICD-10-CM | POA: Diagnosis not present

## 2020-10-05 DIAGNOSIS — D509 Iron deficiency anemia, unspecified: Secondary | ICD-10-CM

## 2020-10-05 DIAGNOSIS — N189 Chronic kidney disease, unspecified: Secondary | ICD-10-CM | POA: Diagnosis not present

## 2020-10-05 DIAGNOSIS — C9000 Multiple myeloma not having achieved remission: Secondary | ICD-10-CM | POA: Diagnosis not present

## 2020-10-05 DIAGNOSIS — E1122 Type 2 diabetes mellitus with diabetic chronic kidney disease: Secondary | ICD-10-CM | POA: Diagnosis not present

## 2020-10-05 DIAGNOSIS — Z95828 Presence of other vascular implants and grafts: Secondary | ICD-10-CM

## 2020-10-05 DIAGNOSIS — I252 Old myocardial infarction: Secondary | ICD-10-CM | POA: Diagnosis not present

## 2020-10-05 DIAGNOSIS — Z853 Personal history of malignant neoplasm of breast: Secondary | ICD-10-CM | POA: Diagnosis not present

## 2020-10-05 DIAGNOSIS — Z923 Personal history of irradiation: Secondary | ICD-10-CM | POA: Diagnosis not present

## 2020-10-05 DIAGNOSIS — Z87891 Personal history of nicotine dependence: Secondary | ICD-10-CM | POA: Diagnosis not present

## 2020-10-05 DIAGNOSIS — Z7189 Other specified counseling: Secondary | ICD-10-CM

## 2020-10-05 DIAGNOSIS — E1140 Type 2 diabetes mellitus with diabetic neuropathy, unspecified: Secondary | ICD-10-CM | POA: Diagnosis not present

## 2020-10-05 LAB — CBC WITH DIFFERENTIAL/PLATELET
Abs Immature Granulocytes: 0.06 10*3/uL (ref 0.00–0.07)
Basophils Absolute: 0 10*3/uL (ref 0.0–0.1)
Basophils Relative: 0 %
Eosinophils Absolute: 0.1 10*3/uL (ref 0.0–0.5)
Eosinophils Relative: 1 %
HCT: 26.3 % — ABNORMAL LOW (ref 36.0–46.0)
Hemoglobin: 8.8 g/dL — ABNORMAL LOW (ref 12.0–15.0)
Immature Granulocytes: 1 %
Lymphocytes Relative: 16 %
Lymphs Abs: 1.6 10*3/uL (ref 0.7–4.0)
MCH: 31.5 pg (ref 26.0–34.0)
MCHC: 33.5 g/dL (ref 30.0–36.0)
MCV: 94.3 fL (ref 80.0–100.0)
Monocytes Absolute: 0.7 10*3/uL (ref 0.1–1.0)
Monocytes Relative: 7 %
Neutro Abs: 7.5 10*3/uL (ref 1.7–7.7)
Neutrophils Relative %: 75 %
Platelets: 217 10*3/uL (ref 150–400)
RBC: 2.79 MIL/uL — ABNORMAL LOW (ref 3.87–5.11)
RDW: 15.9 % — ABNORMAL HIGH (ref 11.5–15.5)
WBC: 9.9 10*3/uL (ref 4.0–10.5)
nRBC: 0 % (ref 0.0–0.2)

## 2020-10-05 LAB — CMP (CANCER CENTER ONLY)
ALT: 15 U/L (ref 0–44)
AST: 11 U/L — ABNORMAL LOW (ref 15–41)
Albumin: 3.6 g/dL (ref 3.5–5.0)
Alkaline Phosphatase: 66 U/L (ref 38–126)
Anion gap: 8 (ref 5–15)
BUN: 27 mg/dL — ABNORMAL HIGH (ref 8–23)
CO2: 22 mmol/L (ref 22–32)
Calcium: 9.1 mg/dL (ref 8.9–10.3)
Chloride: 106 mmol/L (ref 98–111)
Creatinine: 1.49 mg/dL — ABNORMAL HIGH (ref 0.44–1.00)
GFR, Estimated: 36 mL/min — ABNORMAL LOW (ref >60)
Glucose, Bld: 98 mg/dL (ref 70–99)
Potassium: 4.4 mmol/L (ref 3.5–5.1)
Sodium: 136 mmol/L (ref 135–145)
Total Bilirubin: 0.3 mg/dL (ref 0.3–1.2)
Total Protein: 6.7 g/dL (ref 6.5–8.1)

## 2020-10-05 MED ORDER — SODIUM CHLORIDE 0.9 % IV SOLN
20.0000 mg | Freq: Once | INTRAVENOUS | Status: AC
  Administered 2020-10-05: 20 mg via INTRAVENOUS
  Filled 2020-10-05: qty 20

## 2020-10-05 MED ORDER — SODIUM CHLORIDE 0.9 % IV SOLN
16.0000 mg/kg | Freq: Once | INTRAVENOUS | Status: AC
  Administered 2020-10-05: 1400 mg via INTRAVENOUS
  Filled 2020-10-05: qty 60

## 2020-10-05 MED ORDER — CYANOCOBALAMIN 1000 MCG/ML IJ SOLN
INTRAMUSCULAR | Status: AC
  Filled 2020-10-05: qty 1

## 2020-10-05 MED ORDER — ACETAMINOPHEN 325 MG PO TABS
ORAL_TABLET | ORAL | Status: AC
  Filled 2020-10-05: qty 2

## 2020-10-05 MED ORDER — DIPHENHYDRAMINE HCL 25 MG PO CAPS
50.0000 mg | ORAL_CAPSULE | Freq: Once | ORAL | Status: AC
  Administered 2020-10-05: 50 mg via ORAL

## 2020-10-05 MED ORDER — ACETAMINOPHEN 325 MG PO TABS
650.0000 mg | ORAL_TABLET | Freq: Once | ORAL | Status: AC
  Administered 2020-10-05: 650 mg via ORAL

## 2020-10-05 MED ORDER — DEXTROSE 5 % IV SOLN
56.0000 mg/m2 | Freq: Once | INTRAVENOUS | Status: AC
  Administered 2020-10-05: 110 mg via INTRAVENOUS
  Filled 2020-10-05: qty 30

## 2020-10-05 MED ORDER — CYANOCOBALAMIN 1000 MCG/ML IJ SOLN
1000.0000 ug | Freq: Once | INTRAMUSCULAR | Status: AC
  Administered 2020-10-05: 1000 ug via SUBCUTANEOUS

## 2020-10-05 MED ORDER — SODIUM CHLORIDE 0.9% FLUSH
10.0000 mL | Freq: Once | INTRAVENOUS | Status: AC
Start: 2020-10-05 — End: 2020-10-05
  Administered 2020-10-05: 10 mL
  Filled 2020-10-05: qty 10

## 2020-10-05 MED ORDER — FAMOTIDINE IN NACL 20-0.9 MG/50ML-% IV SOLN
INTRAVENOUS | Status: AC
  Filled 2020-10-05: qty 50

## 2020-10-05 MED ORDER — SODIUM CHLORIDE 0.9 % IV SOLN
Freq: Once | INTRAVENOUS | Status: AC
  Filled 2020-10-05: qty 250

## 2020-10-05 MED ORDER — FAMOTIDINE IN NACL 20-0.9 MG/50ML-% IV SOLN
20.0000 mg | Freq: Once | INTRAVENOUS | Status: AC
  Administered 2020-10-05: 20 mg via INTRAVENOUS

## 2020-10-05 MED ORDER — SODIUM CHLORIDE 0.9 % IV SOLN
Freq: Once | INTRAVENOUS | Status: AC
Start: 2020-10-05 — End: 2020-10-05
  Filled 2020-10-05: qty 250

## 2020-10-05 MED ORDER — DIPHENHYDRAMINE HCL 25 MG PO CAPS
ORAL_CAPSULE | ORAL | Status: AC
  Filled 2020-10-05: qty 2

## 2020-10-05 MED ORDER — HEPARIN SOD (PORK) LOCK FLUSH 100 UNIT/ML IV SOLN
500.0000 [IU] | Freq: Once | INTRAVENOUS | Status: AC | PRN
  Administered 2020-10-05: 500 [IU]
  Filled 2020-10-05: qty 5

## 2020-10-05 MED ORDER — SODIUM CHLORIDE 0.9% FLUSH
10.0000 mL | INTRAVENOUS | Status: DC | PRN
  Administered 2020-10-05: 10 mL
  Filled 2020-10-05: qty 10

## 2020-10-05 NOTE — Patient Instructions (Signed)
[facility] Discharge Instructions for Patients Receiving Chemotherapy  Today you received the following chemotherapy agents: carfilzomib (Kyprolis) and daratumumab (Darzalex).  To help prevent nausea and vomiting after your treatment, we encourage you to take your nausea medication as directed.   If you develop nausea and vomiting that is not controlled by your nausea medication, call the clinic.   BELOW ARE SYMPTOMS THAT SHOULD BE REPORTED IMMEDIATELY:  *FEVER GREATER THAN 100.5 F  *CHILLS WITH OR WITHOUT FEVER  NAUSEA AND VOMITING THAT IS NOT CONTROLLED WITH YOUR NAUSEA MEDICATION  *UNUSUAL SHORTNESS OF BREATH  *UNUSUAL BRUISING OR BLEEDING  TENDERNESS IN MOUTH AND THROAT WITH OR WITHOUT PRESENCE OF ULCERS  *URINARY PROBLEMS  *BOWEL PROBLEMS  UNUSUAL RASH Items with * indicate a potential emergency and should be followed up as soon as possible.  Feel free to call the clinic should you have any questions or concerns. The clinic phone number is [phone].  Please show the [facility] at check-in to the Emergency Department and triage nurse.

## 2020-10-05 NOTE — Patient Instructions (Signed)

## 2020-10-06 ENCOUNTER — Other Ambulatory Visit: Payer: Self-pay | Admitting: Family Medicine

## 2020-10-09 DIAGNOSIS — R519 Headache, unspecified: Secondary | ICD-10-CM | POA: Diagnosis not present

## 2020-10-09 DIAGNOSIS — M542 Cervicalgia: Secondary | ICD-10-CM | POA: Diagnosis not present

## 2020-10-09 LAB — MULTIPLE MYELOMA PANEL, SERUM
Albumin SerPl Elph-Mcnc: 3.3 g/dL (ref 2.9–4.4)
Albumin/Glob SerPl: 1.2 (ref 0.7–1.7)
Alpha 1: 0.2 g/dL (ref 0.0–0.4)
Alpha2 Glob SerPl Elph-Mcnc: 1.3 g/dL — ABNORMAL HIGH (ref 0.4–1.0)
B-Globulin SerPl Elph-Mcnc: 1 g/dL (ref 0.7–1.3)
Gamma Glob SerPl Elph-Mcnc: 0.4 g/dL (ref 0.4–1.8)
Globulin, Total: 3 g/dL (ref 2.2–3.9)
IgA: 41 mg/dL — ABNORMAL LOW (ref 64–422)
IgG (Immunoglobin G), Serum: 434 mg/dL — ABNORMAL LOW (ref 586–1602)
IgM (Immunoglobulin M), Srm: 28 mg/dL (ref 26–217)
M Protein SerPl Elph-Mcnc: 0.2 g/dL — ABNORMAL HIGH
Total Protein ELP: 6.3 g/dL (ref 6.0–8.5)

## 2020-10-11 ENCOUNTER — Other Ambulatory Visit: Payer: Self-pay | Admitting: Student

## 2020-10-12 ENCOUNTER — Ambulatory Visit (HOSPITAL_COMMUNITY)
Admission: RE | Admit: 2020-10-12 | Discharge: 2020-10-12 | Disposition: A | Discharge status: Home / Self Care | Payer: Medicare Other | Source: Ambulatory Visit | Attending: Hematology | Admitting: Hematology

## 2020-10-12 ENCOUNTER — Encounter (HOSPITAL_COMMUNITY): Payer: Self-pay

## 2020-10-12 ENCOUNTER — Other Ambulatory Visit: Payer: Self-pay

## 2020-10-12 DIAGNOSIS — Z853 Personal history of malignant neoplasm of breast: Secondary | ICD-10-CM | POA: Insufficient documentation

## 2020-10-12 DIAGNOSIS — J42 Unspecified chronic bronchitis: Secondary | ICD-10-CM | POA: Insufficient documentation

## 2020-10-12 DIAGNOSIS — Z923 Personal history of irradiation: Secondary | ICD-10-CM | POA: Insufficient documentation

## 2020-10-12 DIAGNOSIS — I252 Old myocardial infarction: Secondary | ICD-10-CM | POA: Insufficient documentation

## 2020-10-12 DIAGNOSIS — Z803 Family history of malignant neoplasm of breast: Secondary | ICD-10-CM | POA: Diagnosis not present

## 2020-10-12 DIAGNOSIS — Z801 Family history of malignant neoplasm of trachea, bronchus and lung: Secondary | ICD-10-CM | POA: Diagnosis not present

## 2020-10-12 DIAGNOSIS — Z5112 Encounter for antineoplastic immunotherapy: Secondary | ICD-10-CM | POA: Diagnosis not present

## 2020-10-12 DIAGNOSIS — Z8673 Personal history of transient ischemic attack (TIA), and cerebral infarction without residual deficits: Secondary | ICD-10-CM | POA: Diagnosis not present

## 2020-10-12 DIAGNOSIS — Z79899 Other long term (current) drug therapy: Secondary | ICD-10-CM | POA: Insufficient documentation

## 2020-10-12 DIAGNOSIS — C9000 Multiple myeloma not having achieved remission: Secondary | ICD-10-CM | POA: Diagnosis not present

## 2020-10-12 DIAGNOSIS — E785 Hyperlipidemia, unspecified: Secondary | ICD-10-CM | POA: Insufficient documentation

## 2020-10-12 DIAGNOSIS — I10 Essential (primary) hypertension: Secondary | ICD-10-CM | POA: Insufficient documentation

## 2020-10-12 DIAGNOSIS — Z7984 Long term (current) use of oral hypoglycemic drugs: Secondary | ICD-10-CM | POA: Insufficient documentation

## 2020-10-12 DIAGNOSIS — D649 Anemia, unspecified: Secondary | ICD-10-CM | POA: Diagnosis not present

## 2020-10-12 DIAGNOSIS — Z7982 Long term (current) use of aspirin: Secondary | ICD-10-CM | POA: Diagnosis not present

## 2020-10-12 DIAGNOSIS — Z808 Family history of malignant neoplasm of other organs or systems: Secondary | ICD-10-CM | POA: Diagnosis not present

## 2020-10-12 DIAGNOSIS — F419 Anxiety disorder, unspecified: Secondary | ICD-10-CM | POA: Diagnosis not present

## 2020-10-12 DIAGNOSIS — M1990 Unspecified osteoarthritis, unspecified site: Secondary | ICD-10-CM | POA: Diagnosis not present

## 2020-10-12 DIAGNOSIS — Z87891 Personal history of nicotine dependence: Secondary | ICD-10-CM | POA: Insufficient documentation

## 2020-10-12 LAB — CBC WITH DIFFERENTIAL/PLATELET
Abs Immature Granulocytes: 0.04 10*3/uL (ref 0.00–0.07)
Basophils Absolute: 0 10*3/uL (ref 0.0–0.1)
Basophils Relative: 0 %
Eosinophils Absolute: 0.1 10*3/uL (ref 0.0–0.5)
Eosinophils Relative: 1 %
HCT: 29 % — ABNORMAL LOW (ref 36.0–46.0)
Hemoglobin: 9.5 g/dL — ABNORMAL LOW (ref 12.0–15.0)
Immature Granulocytes: 1 %
Lymphocytes Relative: 23 %
Lymphs Abs: 1.5 10*3/uL (ref 0.7–4.0)
MCH: 32.1 pg (ref 26.0–34.0)
MCHC: 32.8 g/dL (ref 30.0–36.0)
MCV: 98 fL (ref 80.0–100.0)
Monocytes Absolute: 0.5 10*3/uL (ref 0.1–1.0)
Monocytes Relative: 8 %
Neutro Abs: 4.4 10*3/uL (ref 1.7–7.7)
Neutrophils Relative %: 67 %
Platelets: 206 10*3/uL (ref 150–400)
RBC: 2.96 MIL/uL — ABNORMAL LOW (ref 3.87–5.11)
RDW: 15.8 % — ABNORMAL HIGH (ref 11.5–15.5)
WBC: 6.6 10*3/uL (ref 4.0–10.5)
nRBC: 0 % (ref 0.0–0.2)

## 2020-10-12 LAB — GLUCOSE, CAPILLARY: Glucose-Capillary: 94 mg/dL (ref 70–99)

## 2020-10-12 MED ORDER — FENTANYL CITRATE (PF) 100 MCG/2ML IJ SOLN
INTRAMUSCULAR | Status: AC | PRN
  Administered 2020-10-12 (×2): 50 ug via INTRAVENOUS

## 2020-10-12 MED ORDER — LIDOCAINE HCL (PF) 1 % IJ SOLN
INTRAMUSCULAR | Status: AC | PRN
  Administered 2020-10-12: 10 mL

## 2020-10-12 MED ORDER — MIDAZOLAM HCL 2 MG/2ML IJ SOLN
INTRAMUSCULAR | Status: AC
  Filled 2020-10-12: qty 4

## 2020-10-12 MED ORDER — SODIUM CHLORIDE 0.9 % IV SOLN: INTRAVENOUS | Status: DC

## 2020-10-12 MED ORDER — HEPARIN SOD (PORK) LOCK FLUSH 100 UNIT/ML IV SOLN
500.0000 [IU] | INTRAVENOUS | Status: AC | PRN
  Administered 2020-10-12: 500 [IU]
  Filled 2020-10-12: qty 5

## 2020-10-12 MED ORDER — FENTANYL CITRATE (PF) 100 MCG/2ML IJ SOLN
INTRAMUSCULAR | Status: AC
  Filled 2020-10-12: qty 2

## 2020-10-12 MED ORDER — MIDAZOLAM HCL 2 MG/2ML IJ SOLN
INTRAMUSCULAR | Status: AC | PRN
  Administered 2020-10-12 (×2): 1 mg via INTRAVENOUS

## 2020-10-12 NOTE — Procedures (Signed)
Interventional Radiology Procedure Note  Procedure: CT guided aspirate and core biopsy of right iliac bone Complications: None Recommendations: - Bedrest supine x 1 hrs - Hydrocodone PRN  Pain - Follow biopsy results  Signed,  Deverick Pruss K. Kinston Magnan, MD   

## 2020-10-12 NOTE — Discharge Instructions (Signed)
DO NOT SHOWER X 24 HRS FOR ANY QUESTIONS OR CONCERNS CALL [phone]   Needle Biopsy, Care After These instructions tell you how to care for yourself after your procedure. Your doctor may also give you more specific instructions. Call your doctor if you have any problems or questions. What can I expect after the procedure? After the procedure, it is common to have:  Soreness.  Bruising.  Mild pain. Follow these instructions at home:  Return to your normal activities as told by your doctor. Ask your doctor what activities are safe for you.  Take over-the-counter and prescription medicines only as told by your doctor.  Wash your hands with soap and water before you change your bandage (dressing). If you cannot use soap and water, use hand sanitizer.  Follow instructions from your doctor about: ? How to take care of your puncture site. ? When and how to change your bandage. ? When to remove your bandage.  Check your puncture site every day for signs of infection. Watch for: ? Redness, swelling, or pain. ? Fluid or blood. ? Pus or a bad smell. ? Warmth.  Do not take baths, swim, or use a hot tub until your doctor approves. Ask your doctor if you may take showers. You may only be allowed to take sponge baths.  Keep all follow-up visits as told by your doctor. This is important.   Contact a doctor if you have:  A fever.  Redness, swelling, or pain at the puncture site, and it lasts longer than a few days.  Fluid, blood, or pus coming from the puncture site.  Warmth coming from the puncture site. Get help right away if:  You have a lot of bleeding from the puncture site. Summary  After the procedure, it is common to have soreness, bruising, or mild pain at the puncture site.  Check your puncture site every day for signs of infection, such as redness, swelling, or pain.  Get help right away if you have severe bleeding from your puncture site. This information is not  intended to replace advice given to you by your health care provider. Make sure you discuss any questions you have with your health care provider. Document Revised: 01/13/2020 Document Reviewed: 01/13/2020 Elsevier Patient Education  2021 [employer]. Moderate Conscious Sedation, Adult, Care After This sheet gives you information about how to care for yourself after your procedure. Your health care provider may also give you more specific instructions. If you have problems or questions, contact your health care provider. What can I expect after the procedure? After the procedure, it is common to have:  Sleepiness for several hours.  Impaired judgment for several hours.  Difficulty with balance.  Vomiting if you eat too soon. Follow these instructions at home: For the time period you were told by your health care provider:  Rest.  Do not participate in activities where you could fall or become injured.  Do not drive or use machinery.  Do not drink alcohol.  Do not take sleeping pills or medicines that cause drowsiness.  Do not make important decisions or sign legal documents.  Do not take care of children on your own.      Eating and drinking  Follow the diet recommended by your health care provider.  Drink enough fluid to keep your urine pale yellow.  If you vomit: ? Drink water, juice, or soup when you can drink without vomiting. ? Make sure you have little or no nausea before   eating solid foods.   General instructions  Take over-the-counter and prescription medicines only as told by your health care provider.  Have a responsible adult stay with you for the time you are told. It is important to have someone help care for you until you are awake and alert.  Do not smoke.  Keep all follow-up visits as told by your health care provider. This is important. Contact a health care provider if:  You are still sleepy or having trouble with balance after 24 hours.  You  feel light-headed.  You keep feeling nauseous or you keep vomiting.  You develop a rash.  You have a fever.  You have redness or swelling around the IV site. Get help right away if:  You have trouble breathing.  You have new-onset confusion at home. Summary  After the procedure, it is common to feel sleepy, have impaired judgment, or feel nauseous if you eat too soon.  Rest after you get home. Know the things you should not do after the procedure.  Follow the diet recommended by your health care provider and drink enough fluid to keep your urine pale yellow.  Get help right away if you have trouble breathing or new-onset confusion at home. This information is not intended to replace advice given to you by your health care provider. Make sure you discuss any questions you have with your health care provider. Document Revised: 11/12/2019 Document Reviewed: 06/10/2019 Elsevier Patient Education  2021 [employer].  

## 2020-10-12 NOTE — H&P (Signed)
Chief Complaint: Patient was seen in consultation today for multiple myeloma/bone marrow biopsy and aspiration.  Referring Physician(s): [name] (oncology)  Supervising Physician: Jacqulynn Cadet  Patient Status: [facility] - Out-pt  History of Present Illness: Tracy [name] is a 75 y.o. female with a past medical history of hypertension, hyperlipidemia, MI 2001, CVA 2004, chronic bronchitis, breast cancer, multiple myeloma, arthritis, and anxiety. She is known to IR- she has undergone bone marrow biopsy and Port-a-cath placement in IR in 2021. She was unfortunately diagnosed with multiple myeloma in 2021. Her cancer is managed by Dr. Irene Limbo. She has undergone immunotherapy for management.  IR consulted by Dr. Irene Limbo for possible image-guided bone marrow biopsy and aspiration to evaluate response to treatment. Patient awake and alert laying in bed with no complaints at this time. Denies fever, chills, chest pain, dyspnea, abdominal pain, or headache.   Past Medical History:  Diagnosis Date  . Angiodysplasia of intestine 08/23/2020  . Anxiety   . Arthritis   . Breast cancer ([place]) 06/13/15  . Cancer ([place]) 2000   breast cancer  . Chronic bronchitis ([place])   . Chronic bronchitis ([place])   . Hyperlipidemia   . Hypertension   . Myocardial infarction ([place]) 2001  . Personal history of radiation therapy   . Restless leg   . Stroke [facility]) 2004   TIA, no deficits    Past Surgical History:  Procedure Laterality Date  . ABDOMINAL HYSTERECTOMY  1985  . BREAST LUMPECTOMY Left 2000   radiation and chemo  . BREAST LUMPECTOMY Right 2016   radiation  . BREAST SURGERY  2001   lt breast lumpectomy  . COLONOSCOPY WITH ESOPHAGOGASTRODUODENOSCOPY (EGD)  04/2020  . GIVENS CAPSULE STUDY  07/2020  . IR IMAGING GUIDED PORT INSERTION  04/25/2020  . RADIOACTIVE SEED GUIDED PARTIAL MASTECTOMY WITH AXILLARY SENTINEL LYMPH NODE BIOPSY Right 07/07/2015   Procedure: RIGHT RADIOACTIVE SEED GUIDED  PARTIAL MASTECTOMY WITH AXILLARY SENTINEL LYMPH NODE BIOPSY;  Surgeon: Autumn Messing III, MD;  Location: [facility];  Service: General;  Laterality: Right;  . SMALL INTESTINE SURGERY    . TUBAL LIGATION      Allergies: Bacitracin-neomycin-polymyxin  [neomycin-bacitracin zn-polymyx], Nsaids, Tape, Ambien [zolpidem tartrate], Amoxicillin, Contrast media [iodinated diagnostic agents], Latex, and Prednisone  Medications: Prior to Admission medications   Medication Sig Start Date End Date Taking? Authorizing Provider  acyclovir (ZOVIRAX) 400 MG tablet Take 1 tablet (400 mg total) by mouth 2 (two) times daily. 02/07/20  Yes [name], MD  albuterol (VENTOLIN HFA) 108 (90 Base) MCG/ACT inhaler Inhale 2 puffs into the lungs every 6 (six) hours as needed for wheezing or shortness of breath. 03/17/20  Yes Tanner, Lyndon Code., PA-C  aspirin 81 MG tablet Take 81 mg by mouth daily.   Yes [provider]  atorvastatin (LIPITOR) 10 MG tablet TAKE 1 TABLET BY MOUTH  DAILY 04/04/20  Yes [name], MD  ergocalciferol (VITAMIN D2) 1.25 MG (50000 UT) capsule Take 1 capsule (50,000 Units total) by mouth once a week. 03/24/20  Yes [name], MD  fluticasone (FLOVENT HFA) 110 MCG/ACT inhaler Inhale 2 puffs into the lungs in the morning and at bedtime. 03/17/20  Yes Tanner, Lucianne Lei E., PA-C  glucose blood (ACCU-CHEK GUIDE) test strip Use as instructed to check sugars 1-2 times daily. 02/10/20  Yes [name], MD  lidocaine-prilocaine (EMLA) cream APPLY 1 APPLICATION TOPICALLY AS NEEDED. 09/25/20  Yes [name], MD  loratadine (CLARITIN) 10 MG  tablet Take 10 mg by mouth daily.   Yes [provider]  losartan (COZAAR) 100 MG tablet TAKE 1 TABLET BY MOUTH  DAILY 04/04/20  Yes [name], MD  metFORMIN (GLUCOPHAGE) 500 MG tablet TAKE 1 TABLET(500 MG) BY MOUTH TWICE DAILY WITH A MEAL 10/06/20  Yes [name], MD  mometasone (NASONEX) 50 MCG/ACT nasal  spray Place 2 sprays into the nose daily. Patient taking differently: Place 2 sprays into the nose as needed. 03/16/20 03/16/21 Yes [name], MD  ondansetron (ZOFRAN) 8 MG tablet Take 1 tablet (8 mg total) by mouth 2 (two) times daily as needed (Nausea or vomiting). 02/07/20  Yes [name], MD  pantoprazole (PROTONIX) 40 MG tablet TAKE 1 TABLET BY MOUTH  TWICE DAILY 07/06/20  Yes [name], MD  potassium chloride SA (KLOR-CON) 20 MEQ tablet TAKE 1 TABLET(20 MEQ) BY MOUTH TWICE DAILY 09/27/20  Yes [name], MD  pregabalin (LYRICA) 50 MG capsule Take 1 capsule (50 mg total) by mouth 3 (three) times daily. 10/04/20  Yes [name], MD  prochlorperazine (COMPAZINE) 10 MG tablet Take 1 tablet (10 mg total) by mouth every 6 (six) hours as needed (Nausea or vomiting). 02/07/20  Yes [name], MD  tiZANidine (ZANAFLEX) 4 MG tablet TAKE 1 TABLET(4 MG) BY MOUTH AT BEDTIME Patient taking differently: Take 4 mg by mouth at bedtime. 07/10/20  Yes [name], MD  traZODone (DESYREL) 100 MG tablet TAKE 1 TABLET BY MOUTH AT  BEDTIME 12/09/19  Yes [name], MD  Accu-Chek Softclix Lancets lancets Use as instructed to check sugars 1-2 times daily. 02/10/20   [name], MD  dexamethasone (DECADRON) 4 MG tablet Take 3 tabs with breakfast the day after each treatment. 02/07/20   [name], MD     Family History  Problem Relation Age of Onset  . Diabetes Father   . Lung cancer Sister        dx. <50; former smoker  . Diabetes Brother   . Diabetes Paternal Aunt   . Stroke Maternal Grandmother   . Diabetes Paternal Grandmother   . Emphysema Mother 36       smoker  . Diabetes Brother   . Brain cancer Brother 66       unknown tumor type  . Cancer Daughter 68       neck cancer  . Other Daughter        hysterectomy for unspecified reason  . Breast cancer Cousin   . Cancer Cousin        unspecified type  . Breast cancer  Other        triple negative breast cancer in her 75s  . Cancer Daughter   . Colon polyps Neg Hx   . Esophageal cancer Neg Hx   . Gallbladder disease Neg Hx     Social History   Socioeconomic History  . Marital status: Divorced    Spouse name: Not on file  . Number of children: 7  . Years of education: Not on file  . Highest education level: Not on file  Occupational History  . Occupation: retired  Tobacco Use  . Smoking status: Former Smoker    Packs/day: 1.00    Years: 20.00    Pack years: 20.00    Types: Cigarettes    Quit date: 07/30/2011    Years since quitting: 9.2  . Smokeless tobacco: Never Used  . Tobacco comment:  Quit >4 years ago; 1 ppd for about 5/20 years (remaining was less)  Vaping Use  . Vaping Use: Former  Substance and Sexual Activity  . Alcohol use: No    Alcohol/week: 0.0 standard drinks  . Drug use: No  . Sexual activity: Not Currently  Other Topics Concern  . Not on file  Social History Narrative   Lives alone.  Retired.  Education:  11th grade GED.  Children:  7 (one here).    Social Determinants of Health   Financial Resource Strain: Low Risk   . Difficulty of Paying Living Expenses: Not hard at all  Food Insecurity: No Food Insecurity  . Worried About Charity fundraiser in the Last Year: Never true  . Ran Out of Food in the Last Year: Never true  Transportation Needs: No Transportation Needs  . Lack of Transportation (Medical): No  . Lack of Transportation (Non-Medical): No  Physical Activity: Inactive  . Days of Exercise per Week: 0 days  . Minutes of Exercise per Session: 0 min  Stress: No Stress Concern Present  . Feeling of Stress : Not at all  Social Connections: Moderately Isolated  . Frequency of Communication with Friends and Family: More than three times a week  . Frequency of Social Gatherings with Friends and Family: Once a week  . Attends Religious Services: 1 to 4 times per year  . Active Member of Clubs or Organizations:  No  . Attends Archivist Meetings: Never  . Marital Status: Divorced     Review of Systems: A 12 point ROS discussed and pertinent positives are indicated in the HPI above.  All other systems are negative.  Review of Systems  Constitutional: Negative for chills and fever.  Respiratory: Negative for shortness of breath and wheezing.   Cardiovascular: Negative for chest pain and palpitations.  Gastrointestinal: Negative for abdominal pain.  Neurological: Negative for headaches.  Psychiatric/Behavioral: Negative for behavioral problems and confusion.    Vital Signs: BP 137/84   Pulse 87   Temp 98.5 F (36.9 C) (Oral)   Resp 16   SpO2 100%   Physical Exam Vitals and nursing note reviewed.  Constitutional:      General: She is not in acute distress. Cardiovascular:     Rate and Rhythm: Normal rate and regular rhythm.     Heart sounds: Normal heart sounds. No murmur heard.   Pulmonary:     Effort: Pulmonary effort is normal. No respiratory distress.     Breath sounds: Normal breath sounds. No wheezing.  Skin:    General: Skin is warm and dry.  Neurological:     Mental Status: She is alert and oriented to person, place, and time.      MD Evaluation Airway: WNL Heart: WNL Abdomen: WNL Chest/ Lungs: WNL ASA  Classification: 3 Mallampati/Airway Score: Two   Imaging: DG Cervical Spine Complete  Result Date: 09/16/2020 CLINICAL DATA:  Cervical degenerative disc disease. Sharp pain in left eye, left ear, and headache. Neck pain. EXAM: CERVICAL SPINE - COMPLETE 4+ VIEW COMPARISON:  None. FINDINGS: The pre odontoid space and prevertebral soft tissues are normal. No malalignment. Multilevel degenerative disc disease with anterior osteophytes. There are a few small posterior osteophytes as well. The right-sided neural foramina are patent on oblique imaging. No critical stenosis of the left-sided neural foramina on oblique imaging. There is mild narrowing of and upper  left neural foramen with small osteophytes. Uncovertebral degenerative changes are identified. The lateral masses  of C1 align with C2. The odontoid process is unremarkable. Limited views of the lung apices are normal. IMPRESSION: Degenerative changes as above. Electronically Signed   By: Dorise Bullion III M.D   On: 09/16/2020 17:16    Labs:  CBC: Recent Labs    09/21/20 1000 09/28/20 1008 10/05/20 1040 10/12/20 0928  WBC 4.3 4.0 9.9 6.6  HGB 7.7* 9.2* 8.8* 9.5*  HCT 23.6* 27.8* 26.3* 29.0*  PLT 287 205 217 206    COAGS: Recent Labs    04/25/20 1200  INR 1.2    BMP: Recent Labs    03/24/20 1032 03/29/20 1017 04/07/20 0805 04/21/20 1024 05/05/20 1115 09/07/20 0911 09/21/20 1000 09/28/20 1008 10/05/20 1040  NA 136 139 137 139   < > 140 137 139 136  K 3.5 3.8 4.1 4.0   < > 4.7 4.8 4.4 4.4  CL 106 105 108 109   < > 111 110 111 106  CO2 23 25 21* 24   < > 20* 18* 21* 22  GLUCOSE 113* 139* 112* 105*   < > 88 98 81 98  BUN _0 < > _1 27*  CALCIUM 8.3* 9.3 8.9 8.1*   < > 9.2 8.2* 9.1 9.1  CREATININE 0.93 1.04* 1.13* 1.42*   < > 1.48* 1.34* 1.37* 1.49*  GFRNONAA >60 52* 47* 36*   < > 36* 41* 40* 36*  GFRAA >60 >60 55* 41*  --   --   --   --   --    < > = values in this interval not displayed.    LIVER FUNCTION TESTS: Recent Labs    09/07/20 0911 09/21/20 1000 09/28/20 1008 10/05/20 1040  BILITOT 0.3 0.3 0.3 0.3  AST 10* 12* 13* 11*  ALT _2 ALKPHOS 68 73 69 66  PROT 6.5 6.5 6.4* 6.7  ALBUMIN 3.5 3.5 3.5 3.6     Assessment and Plan:  History of multiple myeloma s/p systemic immunotherapy as management. Plan for image-guided bone marrow biopsy/aspiration today in IR. Patient is NPO. Afebrile. CBC with differential ordered this AM.  Risks and benefits discussed with the patient including, but not limited to bleeding, infection, damage to adjacent structures or low yield requiring additional tests. All of the patient's questions  were answered, patient is agreeable to proceed. Consent signed and in chart.   Thank you for this interesting consult.  I greatly enjoyed meeting Tracy [name] and look forward to participating in their care.  A copy of this report was sent to the requesting provider on this date.  Electronically Signed: Earley Abide, PA-C 10/12/2020, 10:22 AM   I spent a total of 25 Minutes in face to face in clinical consultation, greater than 50% of which was counseling/coordinating care for multiple myeloma/bone marrow biopsy and aspiration.

## 2020-10-13 ENCOUNTER — Ambulatory Visit (HOSPITAL_COMMUNITY): Admission: RE | Admit: 2020-10-13 | Payer: Medicare Other | Source: Ambulatory Visit

## 2020-10-18 NOTE — Progress Notes (Signed)
HEMATOLOGY/ONCOLOGY CLINIC NOTE  Date of Service: 10/19/2020  Patient Care Team: [name], MD as PCP - General (Family Medicine) [name], MD as Attending Physician (Physical Medicine and Rehabilitation) [name], MD as Consulting Physician (Orthopedic Surgery) [name], MD as Consulting Physician (Otolaryngology) [name], MD as Consulting Physician (Gastroenterology) [name], MD as Consulting Physician (General Surgery) [name], [facility] as Pharmacist (Pharmacist)  CHIEF COMPLAINTS/PURPOSE OF CONSULTATION:  Multiple Myeloma not having achieved remission  HISTORY OF PRESENTING ILLNESS:  Tracy [name] is a wonderful 75 y.o. female who has been referred to [place] by Dr [name] for evaluation and management of Multiple Myeloma. Pt is accompanied today by her daughter in person and other daughter and granddaughter via phone. The pt reports that she is doing well overall.   When pt was first diagnosed with Breast Cancer it was localized in both of her breasts and was considered Stage 1. She then received chemotherapy. However, she declined chemotherapy a second time and chose radiation therapy after recurrence. Pt has been under the surveillance of Dr. [name] after declining antiestrogen therapy.   The pt reports that she was having difficulty breathing so she saw her Pulmonologist, who ordered the work up. Based on the results of the PET/CT pt was sent to Dr. [name] as they were concerned that her breast cancer had spread. Pt has been becoming increasingly anemic over the last year, despite using a PO Iron supplement. She has had a positive Fecal Occult tests, but has been having issues with hemorrhoids and hemorrhoidal bleeding for the last 5-6 months. Pt denies any blood in her stools but notices blood on the tissue after she wipes. Pt has also been having some discomfort in her left hip and lower back. This pain is worsened when she sits down.    Pt has had a chronic cough that has been thought to either be Bronchitis or a result of acid reflux. Pt is planning to receive an Endoscopy to work this up further. She is currently taking Gabapentin for tingling/burning in her extremities. Pt has Type II Diabetes and was previously using Metformin, but discontinued due to concern for liver damage. She is not currently using any medications to control her Diabetes.   Pt had a heart attack in 2001. She initially though that she had a [employer], until the sensation began to travel up her body. She was given Nitroglycerin upon admittance to the hospital. Pt did not require any stents or other interventions. No cause for her heart attack was ever discovered. Pt has been seen by Neurology, Dr. Leta Baptist, who completed imaging on her neck and found a degeneratve disk. Pt has not had a nerve study conducted. Pt has no history of Shingles and has not received the Shingles vaccine. She has been fully vaccinated against he COVID19 virus.    Of note prior to the patient's visit today, pt has had PET/CT ([id number]) completed on 01/20/2020 with results revealing "1. Hypermetabolism corresponding to left acetabular and L5 lytic lesions, with differential considerations of metastatic disease or myeloma. 2. No evidence of hypermetabolic soft tissue primary, metastatic disease, or soft tissue myeloma. 3. Incidental findings, including: Aortic atherosclerosis (ICD10-I70.0) and emphysema (ICD10-J43.9). Hepatic steatosis."   Pt has had Bone Marrow Surgical Pathology ([id number]) completed on 01/17/2020 which revealed "BONE MARROW, ASPIRATE, CLOT, CORE: -Hypercellular bone marrow for age with plasma cell neoplasm PERIPHERAL BLOOD: -Normocytic-normochromic anemia"  Pt has had Left [place] Biopsy Surgical Pathology  Report [id number]) completed on 01/17/2020 which revealed "Plasma cell neoplasm."  Most recent lab results (01/25/2020) of CBC is as follows:  all values are WNL except for RBC at 2.44, Hgb at 7.5, HCT at 24.0, CO2 at 21, Glucose at 129, Creatinine at 1.38, Total Protein at 10.1, Albumin at 2.8, Total Bilirubin at <0.2, GFR Est Afr Am at 43. 01/25/2020 K/L light chains is as follows: Kappa free light chain at 22.7, Lamda free light chains at 17.7, K/L light chain ratio at 1.28 01/25/2020 MMP is as follows: all values are WNL except for IgG at 5220, Total Protein at 10.1, Alpha2 Glob at 1.2, Gamma Glob at 3.9, M Protein at 3.7, Total Globulin at 6.7, Albumin/Glob at 0.6. 01/25/2020 24-hr UPEP shows all values are WNL  On review of systems, pt reports lower back/left hip pain, unexpected weight loss, abdominal pain, loose stools and denies low appetite, constipation, rashes and any other symptoms.   On PMHx the pt reports Breast Cancer, Breast Lumpectomy, TIA, Restless leg, Myocardial infarction, HTN, HLD, Chronic Bronchitis.  INTERVAL HISTORY: Tracy [name] is a wonderful 75 y.o. female who is here for evaluation and management of Multiple Myeloma. She is here for C10D1 Daratumumab, Carfilzomib, Dexamethasone treatment. The patient's last visit with [place] was on 09/21/2020. The pt reports that she is doing well overall. She is getting Daratumumab q2weeks and Carfilzomib D1,D8,D15 of every 28 day cycle.  The pt reports no new symptoms or concerns. The pt notes that she has not made an appointment with her GI for blood loss, but will follow up. The pt notes her neuropathy and back pain have improved. She received a cortisol steroid shot in her back. The pt notes no acute issues tolerating the treatment.  Of note since the patient's last visit, pt has had CT Bone Marrow Biopsy & Aspiration ([id number]) on 10/12/2020, which revealed "Plasma cells are not increased on aspirate smears (1% by manual  differential count) or by CD138 immunohistochemistry on the clot and  core biopsy (1%). No light chain restricted population is identified by  kappa/lambda light chain in situ hybridization. There is no morphologic evidence of involvement by plasma cell neoplasm. Correlation with clinical findings, other laboratory data, and cytogenetic/FISH results is recommended."  The pt is scheduled for her PET on 10/31/2020.  Lab results today 10/19/2020 of CBC w/diff and CMP is as follows: all values are WNL except for RBC of 2.48, Hgb of 8.1, HCT of 23.7, RDW of 15.9, CO2 of 20, Glucose of 105, Creatinine of 1.36, Calcium of 8.3, Total Protein of 6.2, Albumin of 3.4, AST of 10, GFR est of 40. 10/19/2020 MMP in progress. 10/19/2020 Light Chain in progress.  On review of systems, pt reports nausea and denies vomiting, diarrhea, back pain, abdominal pain, decreased appetite, sudden weight loss, leg swelling, and any other symptoms.  MEDICAL HISTORY:  Past Medical History:  Diagnosis Date  . Angiodysplasia of intestine 08/23/2020  . Anxiety   . Arthritis   . Breast cancer ([place]) 06/13/15  . Cancer ([place]) 2000   breast cancer  . Chronic bronchitis ([place])   . Chronic bronchitis ([place])   . Hyperlipidemia   . Hypertension   . Myocardial infarction ([place]) 2001  . Personal history of radiation therapy   . Restless leg   . Stroke [facility]) 2004   TIA, no deficits    SURGICAL HISTORY: Past Surgical History:  Procedure Laterality Date  . ABDOMINAL HYSTERECTOMY  1985  . BREAST LUMPECTOMY Left  2000   radiation and chemo  . BREAST LUMPECTOMY Right 2016   radiation  . BREAST SURGERY  2001   lt breast lumpectomy  . COLONOSCOPY WITH ESOPHAGOGASTRODUODENOSCOPY (EGD)  04/2020  . GIVENS CAPSULE STUDY  07/2020  . IR IMAGING GUIDED PORT INSERTION  04/25/2020  . RADIOACTIVE SEED GUIDED PARTIAL MASTECTOMY WITH AXILLARY SENTINEL LYMPH NODE BIOPSY Right 07/07/2015   Procedure: RIGHT RADIOACTIVE SEED GUIDED PARTIAL MASTECTOMY WITH AXILLARY SENTINEL LYMPH NODE BIOPSY;  Surgeon: Autumn Messing III, MD;  Location: [place];  Service: General;   Laterality: Right;  . SMALL INTESTINE SURGERY    . TUBAL LIGATION      SOCIAL HISTORY: Social History   Socioeconomic History  . Marital status: Divorced    Spouse name: Not on file  . Number of children: 7  . Years of education: Not on file  . Highest education level: Not on file  Occupational History  . Occupation: retired  Tobacco Use  . Smoking status: Former Smoker    Packs/day: 1.00    Years: 20.00    Pack years: 20.00    Types: Cigarettes    Quit date: 07/30/2011    Years since quitting: 9.2  . Smokeless tobacco: Never Used  . Tobacco comment: Quit >4 years ago; 1 ppd for about 5/20 years (remaining was less)  Vaping Use  . Vaping Use: Former  Substance and Sexual Activity  . Alcohol use: No    Alcohol/week: 0.0 standard drinks  . Drug use: No  . Sexual activity: Not Currently  Other Topics Concern  . Not on file  Social History Narrative   Lives alone.  Retired.  Education:  11th grade GED.  Children:  7 (one here).    Social Determinants of Health   Financial Resource Strain: Low Risk   . Difficulty of Paying Living Expenses: Not hard at all  Food Insecurity: No Food Insecurity  . Worried About Charity fundraiser in the Last Year: Never true  . Ran Out of Food in the Last Year: Never true  Transportation Needs: No Transportation Needs  . Lack of Transportation (Medical): No  . Lack of Transportation (Non-Medical): No  Physical Activity: Inactive  . Days of Exercise per Week: 0 days  . Minutes of Exercise per Session: 0 min  Stress: No Stress Concern Present  . Feeling of Stress : Not at all  Social Connections: Moderately Isolated  . Frequency of Communication with Friends and Family: More than three times a week  . Frequency of Social Gatherings with Friends and Family: Once a week  . Attends Religious Services: 1 to 4 times per year  . Active Member of Clubs or Organizations: No  . Attends Archivist Meetings: Never  . Marital Status:  Divorced  Human resources officer Violence: Not At Risk  . Fear of Current or Ex-Partner: No  . Emotionally Abused: No  . Physically Abused: No  . Sexually Abused: No    FAMILY HISTORY: Family History  Problem Relation Age of Onset  . Diabetes Father   . Lung cancer Sister        dx. <50; former smoker  . Diabetes Brother   . Diabetes Paternal Aunt   . Stroke Maternal Grandmother   . Diabetes Paternal Grandmother   . Emphysema Mother 17       smoker  . Diabetes Brother   . Brain cancer Brother 21       unknown tumor type  .  Cancer Daughter 73       neck cancer  . Other Daughter        hysterectomy for unspecified reason  . Breast cancer Cousin   . Cancer Cousin        unspecified type  . Breast cancer Other        triple negative breast cancer in her 67s  . Cancer Daughter   . Colon polyps Neg Hx   . Esophageal cancer Neg Hx   . Gallbladder disease Neg Hx     ALLERGIES:  is allergic to bacitracin-neomycin-polymyxin  [neomycin-bacitracin zn-polymyx], nsaids, tape, ambien [zolpidem tartrate], amoxicillin, contrast media [iodinated diagnostic agents], latex, and prednisone.  MEDICATIONS:  Current Outpatient Medications  Medication Sig Dispense Refill  . Accu-Chek Softclix Lancets lancets Use as instructed to check sugars 1-2 times daily. 100 each 12  . acyclovir (ZOVIRAX) 400 MG tablet Take 1 tablet (400 mg total) by mouth 2 (two) times daily. 60 tablet 11  . albuterol (VENTOLIN HFA) 108 (90 Base) MCG/ACT inhaler Inhale 2 puffs into the lungs every 6 (six) hours as needed for wheezing or shortness of breath. 8 g 2  . aspirin 81 MG tablet Take 81 mg by mouth daily.    Marland Kitchen atorvastatin (LIPITOR) 10 MG tablet TAKE 1 TABLET BY MOUTH  DAILY 90 tablet 3  . dexamethasone (DECADRON) 4 MG tablet Take 3 tabs with breakfast the day after each treatment. 20 tablet 4  . ergocalciferol (VITAMIN D2) 1.25 MG (50000 UT) capsule Take 1 capsule (50,000 Units total) by mouth once a week. 12 capsule  2  . fluticasone (FLOVENT HFA) 110 MCG/ACT inhaler Inhale 2 puffs into the lungs in the morning and at bedtime. 1 each 12  . glucose blood (ACCU-CHEK GUIDE) test strip Use as instructed to check sugars 1-2 times daily. 100 each 12  . lidocaine-prilocaine (EMLA) cream APPLY 1 APPLICATION TOPICALLY AS NEEDED. 30 g 0  . loratadine (CLARITIN) 10 MG tablet Take 10 mg by mouth daily.    Marland Kitchen losartan (COZAAR) 100 MG tablet TAKE 1 TABLET BY MOUTH  DAILY 90 tablet 3  . metFORMIN (GLUCOPHAGE) 500 MG tablet TAKE 1 TABLET(500 MG) BY MOUTH TWICE DAILY WITH A MEAL 180 tablet 0  . mometasone (NASONEX) 50 MCG/ACT nasal spray Place 2 sprays into the nose daily. (Patient taking differently: Place 2 sprays into the nose as needed.) 17 g 12  . ondansetron (ZOFRAN) 8 MG tablet Take 1 tablet (8 mg total) by mouth 2 (two) times daily as needed (Nausea or vomiting). 30 tablet 1  . pantoprazole (PROTONIX) 40 MG tablet TAKE 1 TABLET BY MOUTH  TWICE DAILY 180 tablet 1  . potassium chloride SA (KLOR-CON) 20 MEQ tablet TAKE 1 TABLET(20 MEQ) BY MOUTH TWICE DAILY 60 tablet 0  . pregabalin (LYRICA) 50 MG capsule Take 1 capsule (50 mg total) by mouth 3 (three) times daily. 90 capsule 0  . prochlorperazine (COMPAZINE) 10 MG tablet Take 1 tablet (10 mg total) by mouth every 6 (six) hours as needed (Nausea or vomiting). 30 tablet 1  . tiZANidine (ZANAFLEX) 4 MG tablet TAKE 1 TABLET(4 MG) BY MOUTH AT BEDTIME (Patient taking differently: Take 4 mg by mouth at bedtime.) 30 tablet 3  . traZODone (DESYREL) 100 MG tablet TAKE 1 TABLET BY MOUTH AT  BEDTIME 90 tablet 3   No current facility-administered medications for this visit.   Facility-Administered Medications Ordered in Other Visits  Medication Dose Route Frequency Provider Last Rate Last Admin  .  sodium chloride flush (NS) 0.9 % injection 10 mL  10 mL Intravenous PRN [name], MD   10 mL at 10/19/20 1034    REVIEW OF SYSTEMS:   10 Point review of Systems was done is  negative except as noted above.  PHYSICAL EXAMINATION: ECOG PERFORMANCE STATUS: 2 - Symptomatic, <50% confined to bed  Exam was given in a wheelchair.   GENERAL:alert, in no acute distress and comfortable SKIN: no acute rashes, no significant lesions EYES: conjunctiva are pink and non-injected, sclera anicteric OROPHARYNX: MMM, no exudates, no oropharyngeal erythema or ulceration NECK: supple, no JVD LYMPH:  no palpable lymphadenopathy in the cervical, axillary or inguinal regions LUNGS: clear to auscultation b/l with normal respiratory effort HEART: regular rate & rhythm ABDOMEN:  normoactive bowel sounds , non tender, not distended. Extremity: no pedal edema PSYCH: alert & oriented x 3 with fluent speech NEURO: no focal motor/sensory deficits  LABORATORY DATA:  I have reviewed the data as listed  . CBC Latest Ref Rng & Units 10/19/2020 10/12/2020 10/05/2020  WBC 4.0 - 10.5 K/uL 4.5 6.6 9.9  Hemoglobin 12.0 - 15.0 g/dL 8.1(L) 9.5(L) 8.8(L)  Hematocrit 36.0 - 46.0 % 23.7(L) 29.0(L) 26.3(L)  Platelets 150 - 400 K/uL 244 206 217    . CMP Latest Ref Rng & Units 10/19/2020 10/05/2020 09/28/2020  Glucose 70 - 99 mg/dL 105(H) 98 81  BUN 8 - 23 mg/dL 18 27(H) 14  Creatinine 0.44 - 1.00 mg/dL 1.36(H) 1.49(H) 1.37(H)  Sodium 135 - 145 mmol/L 143 136 139  Potassium 3.5 - 5.1 mmol/L 4.5 4.4 4.4  Chloride 98 - 111 mmol/L 111 106 111  CO2 22 - 32 mmol/L 20(L) 22 21(L)  Calcium 8.9 - 10.3 mg/dL 8.3(L) 9.1 9.1  Total Protein 6.5 - 8.1 g/dL 6.2(L) 6.7 6.4(L)  Total Bilirubin 0.3 - 1.2 mg/dL 0.3 0.3 0.3  Alkaline Phos 38 - 126 U/L 71 66 69  AST 15 - 41 U/L 10(L) 11(L) 13(L)  ALT 0 - 44 U/L '12 15 12     ' 05/16/2020 Upper Endoscopy:   05/16/2020 Colonoscopy:   01/25/2020 K/L light chains:    01/25/2020 MMP:            RADIOGRAPHIC STUDIES: I have personally reviewed the radiological images as listed and agreed with the findings in the report. CT Biopsy  Result Date:  10/12/2020 INDICATION: History of multiple myeloma now status post immunotherapy. Restaging evaluation. EXAM: CT GUIDED BONE MARROW ASPIRATION AND CORE BIOPSY Interventional Radiologist:  Criselda Peaches, MD MEDICATIONS: None. ANESTHESIA/SEDATION: Moderate (conscious) sedation was employed during this procedure. A total of 2 milligrams versed and 100 micrograms fentanyl were administered intravenously. The patient's level of consciousness and vital signs were monitored continuously by radiology nursing throughout the procedure under my direct supervision. Total monitored sedation time: 10 minutes FLUOROSCOPY TIME:  None. COMPLICATIONS: None immediate. Estimated blood loss: <25 mL PROCEDURE: Informed written consent was obtained from the patient after a thorough discussion of the procedural risks, benefits and alternatives. All questions were addressed. Maximal Sterile Barrier Technique was utilized including caps, mask, sterile gowns, sterile gloves, sterile drape, hand hygiene and skin antiseptic. A timeout was performed prior to the initiation of the procedure. The patient was positioned prone and non-contrast localization CT was performed of the pelvis to demonstrate the iliac marrow spaces. Maximal barrier sterile technique utilized including caps, mask, sterile gowns, sterile gloves, large sterile drape, hand hygiene, and betadine prep. Under sterile conditions and local anesthesia, an  11 gauge coaxial bone biopsy needle was advanced into the right iliac marrow space. Needle position was confirmed with CT imaging. Initially, bone marrow aspiration was performed. Next, the 11 gauge outer cannula was utilized to obtain a right iliac bone marrow core biopsy. Needle was removed. Hemostasis was obtained with compression. The patient tolerated the procedure well. Samples were prepared with the cytotechnologist. IMPRESSION: Technically successful CT-guided bone marrow aspiration and core biopsy. Electronically  Signed   By: Jacqulynn Cadet M.D.   On: 10/12/2020 15:11   CT BONE MARROW BIOPSY & ASPIRATION  Result Date: 10/12/2020 INDICATION: History of multiple myeloma now status post immunotherapy. Restaging evaluation. EXAM: CT GUIDED BONE MARROW ASPIRATION AND CORE BIOPSY Interventional Radiologist:  Criselda Peaches, MD MEDICATIONS: None. ANESTHESIA/SEDATION: Moderate (conscious) sedation was employed during this procedure. A total of 2 milligrams versed and 100 micrograms fentanyl were administered intravenously. The patient's level of consciousness and vital signs were monitored continuously by radiology nursing throughout the procedure under my direct supervision. Total monitored sedation time: 10 minutes FLUOROSCOPY TIME:  None. COMPLICATIONS: None immediate. Estimated blood loss: <25 mL PROCEDURE: Informed written consent was obtained from the patient after a thorough discussion of the procedural risks, benefits and alternatives. All questions were addressed. Maximal Sterile Barrier Technique was utilized including caps, mask, sterile gowns, sterile gloves, sterile drape, hand hygiene and skin antiseptic. A timeout was performed prior to the initiation of the procedure. The patient was positioned prone and non-contrast localization CT was performed of the pelvis to demonstrate the iliac marrow spaces. Maximal barrier sterile technique utilized including caps, mask, sterile gowns, sterile gloves, large sterile drape, hand hygiene, and betadine prep. Under sterile conditions and local anesthesia, an 11 gauge coaxial bone biopsy needle was advanced into the right iliac marrow space. Needle position was confirmed with CT imaging. Initially, bone marrow aspiration was performed. Next, the 11 gauge outer cannula was utilized to obtain a right iliac bone marrow core biopsy. Needle was removed. Hemostasis was obtained with compression. The patient tolerated the procedure well. Samples were prepared with the  cytotechnologist. IMPRESSION: Technically successful CT-guided bone marrow aspiration and core biopsy. Electronically Signed   By: Jacqulynn Cadet M.D.   On: 10/12/2020 15:11   08/08/2020 Capsule Endoscopy   ASSESSMENT & PLAN:   75 yo with   1) Newly diagnosed IgG Kappa Multiple myeloma with bone lesions, anemia, renal insuff. M spike @ 3.7g/dl on diagnosis. 1p deletion, polymorphic variant, 13q deletion 2) h/o Dm2 3) Diabetic Neuropathy 4) CKD - likely from DM2, but could have an element of myeloma kidney. 5) h/o TIA and AMI 6) Iron deficiency   PLAN: -Discussed pt labwork today, 10/19/2020; Hgb decreased, anemic, kidney function stable, calcium not elevated. -Discussed pt CT Bone Marow Biopsy & Aspiration ([id number]) on 10/12/2020; no increase in abnormal plasma cells in bone marrow. No signs of active myeloma. -Discussed pt last MMP on 10/05/2020; m-protein stable at 0.2. -Advised pt that part of her anemia is due to GI blood loss. Recommended again pt f/u w GI regarding capsule endoscopy. Cannot attribute her anemia to myeloma at this time. -Advised pt the next step would be switching to maintenance treatment of Daratumumab q2weeks then qmonthly. -Advised pt that she is in VGPR but not in complete remission. Will still be able to switch to maintenance. Advised that treatment is not curative, and progression is possible. -Continue [place] Vitamin B12 q2weeks initially then q66monthfollowing.  -The pt has no prohibitive toxicities from continuing C10D1  Daratumumab + Carfilzomib necessitating treatment change at this time. -Will proceed with Carfilzomib at 56 mg/m^2 today. -Recommended pt continue to increase physical activity to aid with lower energy and stiffer muscles. -Will change to maintenance treatment at this time. -Continue Zometa. -Stop ASA. -Will see back in 4 weeks with labs.   FOLLOW UP: Changing to maintenance daratumumab every 2 weeks with port flush and  labs. Please cancel cycle 10-day 8 of carfilzomib as per plan changes. Please schedule next 2 cycles (4 doses) of every 2-week daratumumab with port flush and labs. MD visit in 4 weeks.   The total time spent in the appointment was 30 minutes and more than 50% was on counseling and direct patient cares.  All of the patient's questions were answered with apparent satisfaction. The patient knows to call the clinic with any problems, questions or concerns.   [name] MD [place] [facility] Hematology/Oncology Physician [facility]  (Office):       [phone] (Work cell):  [phone] (Fax):           [phone]  10/19/2020 10:50 AM   I, Reinaldo Raddle, am acting as scribe for Dr. [name], MD.    .I have reviewed the above documentation for accuracy and completeness, and I agree with the above. [name] MD

## 2020-10-19 ENCOUNTER — Inpatient Hospital Stay: Payer: Medicare Other

## 2020-10-19 ENCOUNTER — Inpatient Hospital Stay (HOSPITAL_BASED_OUTPATIENT_CLINIC_OR_DEPARTMENT_OTHER): Payer: Medicare Other | Admitting: Hematology

## 2020-10-19 ENCOUNTER — Other Ambulatory Visit: Payer: Self-pay

## 2020-10-19 ENCOUNTER — Telehealth: Payer: Self-pay | Admitting: Hematology

## 2020-10-19 ENCOUNTER — Encounter (HOSPITAL_COMMUNITY): Payer: Self-pay | Admitting: Hematology

## 2020-10-19 VITALS — BP 151/67 | HR 81 | Temp 97.3°F | Resp 18 | Ht 60.0 in | Wt 182.3 lb

## 2020-10-19 VITALS — BP 147/64 | HR 83 | Temp 98.0°F | Resp 18

## 2020-10-19 DIAGNOSIS — C9000 Multiple myeloma not having achieved remission: Secondary | ICD-10-CM

## 2020-10-19 DIAGNOSIS — E1122 Type 2 diabetes mellitus with diabetic chronic kidney disease: Secondary | ICD-10-CM | POA: Diagnosis not present

## 2020-10-19 DIAGNOSIS — N189 Chronic kidney disease, unspecified: Secondary | ICD-10-CM | POA: Diagnosis not present

## 2020-10-19 DIAGNOSIS — Z5112 Encounter for antineoplastic immunotherapy: Secondary | ICD-10-CM | POA: Diagnosis not present

## 2020-10-19 DIAGNOSIS — Z95828 Presence of other vascular implants and grafts: Secondary | ICD-10-CM

## 2020-10-19 DIAGNOSIS — I252 Old myocardial infarction: Secondary | ICD-10-CM | POA: Diagnosis not present

## 2020-10-19 DIAGNOSIS — E1140 Type 2 diabetes mellitus with diabetic neuropathy, unspecified: Secondary | ICD-10-CM | POA: Diagnosis not present

## 2020-10-19 DIAGNOSIS — Z87891 Personal history of nicotine dependence: Secondary | ICD-10-CM | POA: Diagnosis not present

## 2020-10-19 DIAGNOSIS — Z7189 Other specified counseling: Secondary | ICD-10-CM

## 2020-10-19 DIAGNOSIS — Z853 Personal history of malignant neoplasm of breast: Secondary | ICD-10-CM | POA: Diagnosis not present

## 2020-10-19 DIAGNOSIS — Z923 Personal history of irradiation: Secondary | ICD-10-CM | POA: Diagnosis not present

## 2020-10-19 DIAGNOSIS — Z8673 Personal history of transient ischemic attack (TIA), and cerebral infarction without residual deficits: Secondary | ICD-10-CM | POA: Diagnosis not present

## 2020-10-19 LAB — CBC WITH DIFFERENTIAL/PLATELET
Abs Immature Granulocytes: 0.03 10*3/uL (ref 0.00–0.07)
Basophils Absolute: 0 10*3/uL (ref 0.0–0.1)
Basophils Relative: 0 %
Eosinophils Absolute: 0.1 10*3/uL (ref 0.0–0.5)
Eosinophils Relative: 1 %
HCT: 23.7 % — ABNORMAL LOW (ref 36.0–46.0)
Hemoglobin: 8.1 g/dL — ABNORMAL LOW (ref 12.0–15.0)
Immature Granulocytes: 1 %
Lymphocytes Relative: 30 %
Lymphs Abs: 1.3 10*3/uL (ref 0.7–4.0)
MCH: 32.7 pg (ref 26.0–34.0)
MCHC: 34.2 g/dL (ref 30.0–36.0)
MCV: 95.6 fL (ref 80.0–100.0)
Monocytes Absolute: 0.3 10*3/uL (ref 0.1–1.0)
Monocytes Relative: 6 %
Neutro Abs: 2.7 10*3/uL (ref 1.7–7.7)
Neutrophils Relative %: 62 %
Platelets: 244 10*3/uL (ref 150–400)
RBC: 2.48 MIL/uL — ABNORMAL LOW (ref 3.87–5.11)
RDW: 15.9 % — ABNORMAL HIGH (ref 11.5–15.5)
WBC: 4.5 10*3/uL (ref 4.0–10.5)
nRBC: 0 % (ref 0.0–0.2)

## 2020-10-19 LAB — CMP (CANCER CENTER ONLY)
ALT: 12 U/L (ref 0–44)
AST: 10 U/L — ABNORMAL LOW (ref 15–41)
Albumin: 3.4 g/dL — ABNORMAL LOW (ref 3.5–5.0)
Alkaline Phosphatase: 71 U/L (ref 38–126)
Anion gap: 12 (ref 5–15)
BUN: 18 mg/dL (ref 8–23)
CO2: 20 mmol/L — ABNORMAL LOW (ref 22–32)
Calcium: 8.3 mg/dL — ABNORMAL LOW (ref 8.9–10.3)
Chloride: 111 mmol/L (ref 98–111)
Creatinine: 1.36 mg/dL — ABNORMAL HIGH (ref 0.44–1.00)
GFR, Estimated: 40 mL/min — ABNORMAL LOW (ref >60)
Glucose, Bld: 105 mg/dL — ABNORMAL HIGH (ref 70–99)
Potassium: 4.5 mmol/L (ref 3.5–5.1)
Sodium: 143 mmol/L (ref 135–145)
Total Bilirubin: 0.3 mg/dL (ref 0.3–1.2)
Total Protein: 6.2 g/dL — ABNORMAL LOW (ref 6.5–8.1)

## 2020-10-19 MED ORDER — SODIUM CHLORIDE 0.9% FLUSH
10.0000 mL | INTRAVENOUS | Status: DC | PRN
  Administered 2020-10-19: 10 mL via INTRAVENOUS
  Filled 2020-10-19: qty 10

## 2020-10-19 MED ORDER — ACETAMINOPHEN 325 MG PO TABS
650.0000 mg | ORAL_TABLET | Freq: Once | ORAL | Status: AC
  Administered 2020-10-19: 650 mg via ORAL

## 2020-10-19 MED ORDER — DIPHENHYDRAMINE HCL 25 MG PO CAPS
50.0000 mg | ORAL_CAPSULE | Freq: Once | ORAL | Status: AC
  Administered 2020-10-19: 50 mg via ORAL

## 2020-10-19 MED ORDER — DIPHENHYDRAMINE HCL 25 MG PO CAPS
ORAL_CAPSULE | ORAL | Status: AC
  Filled 2020-10-19: qty 2

## 2020-10-19 MED ORDER — FAMOTIDINE IN NACL 20-0.9 MG/50ML-% IV SOLN
20.0000 mg | Freq: Once | INTRAVENOUS | Status: AC
  Administered 2020-10-19: 20 mg via INTRAVENOUS

## 2020-10-19 MED ORDER — SODIUM CHLORIDE 0.9 % IV SOLN
Freq: Once | INTRAVENOUS | Status: AC
  Filled 2020-10-19: qty 250

## 2020-10-19 MED ORDER — ACETAMINOPHEN 325 MG PO TABS
ORAL_TABLET | ORAL | Status: AC
  Filled 2020-10-19: qty 2

## 2020-10-19 MED ORDER — SODIUM CHLORIDE 0.9 % IV SOLN
20.0000 mg | Freq: Once | INTRAVENOUS | Status: AC
  Administered 2020-10-19: 20 mg via INTRAVENOUS
  Filled 2020-10-19: qty 20

## 2020-10-19 MED ORDER — FAMOTIDINE IN NACL 20-0.9 MG/50ML-% IV SOLN
INTRAVENOUS | Status: AC
  Filled 2020-10-19: qty 50

## 2020-10-19 MED ORDER — SODIUM CHLORIDE 0.9 % IV SOLN
Freq: Once | INTRAVENOUS | Status: DC
  Filled 2020-10-19: qty 250

## 2020-10-19 MED ORDER — SODIUM CHLORIDE 0.9% FLUSH
10.0000 mL | INTRAVENOUS | Status: DC | PRN
  Administered 2020-10-19: 10 mL
  Filled 2020-10-19: qty 10

## 2020-10-19 MED ORDER — SODIUM CHLORIDE 0.9 % IV SOLN
16.0000 mg/kg | Freq: Once | INTRAVENOUS | Status: AC
  Administered 2020-10-19: 1400 mg via INTRAVENOUS
  Filled 2020-10-19: qty 60

## 2020-10-19 NOTE — Patient Instructions (Signed)

## 2020-10-19 NOTE — Patient Instructions (Signed)
[place] Discharge Instructions for Patients Receiving Chemotherapy  Today you received the following chemotherapy agents Daratumumab (DARZALEX).  To help prevent nausea and vomiting after your treatment, we encourage you to take your nausea medication as prescribed.   If you develop nausea and vomiting that is not controlled by your nausea medication, call the clinic.   BELOW ARE SYMPTOMS THAT SHOULD BE REPORTED IMMEDIATELY:  *FEVER GREATER THAN 100.5 F  *CHILLS WITH OR WITHOUT FEVER  NAUSEA AND VOMITING THAT IS NOT CONTROLLED WITH YOUR NAUSEA MEDICATION  *UNUSUAL SHORTNESS OF BREATH  *UNUSUAL BRUISING OR BLEEDING  TENDERNESS IN MOUTH AND THROAT WITH OR WITHOUT PRESENCE OF ULCERS  *URINARY PROBLEMS  *BOWEL PROBLEMS  UNUSUAL RASH Items with * indicate a potential emergency and should be followed up as soon as possible.  Feel free to call the clinic should you have any questions or concerns. The clinic phone number is [phone].  Please show the [place] at check-in to the Emergency Department and triage nurse.

## 2020-10-19 NOTE — Telephone Encounter (Signed)
Left message with follow-up appointments per 3/24 los. Gave option to call back to reschedule if needed.

## 2020-10-20 ENCOUNTER — Telehealth: Payer: Self-pay

## 2020-10-20 LAB — KAPPA/LAMBDA LIGHT CHAINS
Kappa free light chain: 8.9 mg/L (ref 3.3–19.4)
Kappa, lambda light chain ratio: 1.2 (ref 0.26–1.65)
Lambda free light chains: 7.4 mg/L (ref 5.7–26.3)

## 2020-10-20 NOTE — Telephone Encounter (Signed)
Returned call to pt and verified her upcoming [place] appointments. Pt verbalized understanding.

## 2020-10-23 ENCOUNTER — Encounter (HOSPITAL_COMMUNITY): Payer: Self-pay | Admitting: Hematology

## 2020-10-24 LAB — MULTIPLE MYELOMA PANEL, SERUM
Albumin SerPl Elph-Mcnc: 3.4 g/dL (ref 2.9–4.4)
Albumin/Glob SerPl: 1.4 (ref 0.7–1.7)
Alpha 1: 0.2 g/dL (ref 0.0–0.4)
Alpha2 Glob SerPl Elph-Mcnc: 1.2 g/dL — ABNORMAL HIGH (ref 0.4–1.0)
B-Globulin SerPl Elph-Mcnc: 0.9 g/dL (ref 0.7–1.3)
Gamma Glob SerPl Elph-Mcnc: 0.3 g/dL — ABNORMAL LOW (ref 0.4–1.8)
Globulin, Total: 2.6 g/dL (ref 2.2–3.9)
IgA: 40 mg/dL — ABNORMAL LOW (ref 64–422)
IgG (Immunoglobin G), Serum: 444 mg/dL — ABNORMAL LOW (ref 586–1602)
IgM (Immunoglobulin M), Srm: 34 mg/dL (ref 26–217)
M Protein SerPl Elph-Mcnc: 0.2 g/dL — ABNORMAL HIGH
Total Protein ELP: 6 g/dL (ref 6.0–8.5)

## 2020-10-25 LAB — SURGICAL PATHOLOGY

## 2020-10-26 ENCOUNTER — Ambulatory Visit: Payer: Medicare Other

## 2020-10-26 ENCOUNTER — Other Ambulatory Visit: Payer: Medicare Other

## 2020-10-29 ENCOUNTER — Other Ambulatory Visit: Payer: Self-pay | Admitting: Hematology

## 2020-10-29 ENCOUNTER — Other Ambulatory Visit: Payer: Self-pay | Admitting: Family Medicine

## 2020-10-30 ENCOUNTER — Telehealth: Payer: Self-pay | Admitting: *Deleted

## 2020-10-30 ENCOUNTER — Other Ambulatory Visit: Payer: Self-pay | Admitting: Hematology

## 2020-10-30 DIAGNOSIS — K922 Gastrointestinal hemorrhage, unspecified: Secondary | ICD-10-CM

## 2020-10-30 DIAGNOSIS — D649 Anemia, unspecified: Secondary | ICD-10-CM

## 2020-10-30 NOTE — Telephone Encounter (Signed)
Patient called, LVM: She contacted Dr. Ardis Hughs office as advised by Dr. Irene Limbo - last note:  "Advised pt that part of her anemia is due to GI blood loss. Recommended again pt f/u w GI regarding capsule endoscopy. Cannot attribute her anemia to myeloma at this time." Dr.Jacobs office informed her they need to know what Dr. Irene Limbo wants them to do. Per patient - they won't make her an appt without hearing from Dr.Kale by referral or faxing a request to them. Dr.Kale informed. Dr. Irene Limbo entered a referral for patient to see Dr. Ardis Hughs (established patient).  Contacted patient to inform of above.

## 2020-10-30 NOTE — Telephone Encounter (Signed)
Requesting:Lyrica 50mg  Contract: UDS: Last Visit:09/14/20 Next Visit:n/a Last Refill:10/04/20 90 caps 0 refills  Please Advise

## 2020-10-31 ENCOUNTER — Other Ambulatory Visit: Payer: Self-pay

## 2020-10-31 ENCOUNTER — Encounter (HOSPITAL_COMMUNITY)
Admission: RE | Admit: 2020-10-31 | Discharge: 2020-10-31 | Disposition: A | Discharge status: Home / Self Care | Payer: Medicare Other | Source: Ambulatory Visit | Attending: Hematology | Admitting: Hematology

## 2020-10-31 DIAGNOSIS — M899 Disorder of bone, unspecified: Secondary | ICD-10-CM | POA: Insufficient documentation

## 2020-10-31 DIAGNOSIS — Z5112 Encounter for antineoplastic immunotherapy: Secondary | ICD-10-CM

## 2020-10-31 DIAGNOSIS — C9000 Multiple myeloma not having achieved remission: Secondary | ICD-10-CM | POA: Insufficient documentation

## 2020-10-31 DIAGNOSIS — I700 Atherosclerosis of aorta: Secondary | ICD-10-CM | POA: Insufficient documentation

## 2020-10-31 LAB — GLUCOSE, CAPILLARY: Glucose-Capillary: 104 mg/dL — ABNORMAL HIGH (ref 70–99)

## 2020-10-31 MED ORDER — FLUDEOXYGLUCOSE F - 18 (FDG) INJECTION
9.0000 | Freq: Once | INTRAVENOUS | Status: AC | PRN
  Administered 2020-10-31: 9 via INTRAVENOUS

## 2020-11-02 ENCOUNTER — Inpatient Hospital Stay: Payer: Medicare Other

## 2020-11-02 ENCOUNTER — Inpatient Hospital Stay: Payer: Medicare Other | Attending: Hematology

## 2020-11-02 ENCOUNTER — Other Ambulatory Visit: Payer: Self-pay

## 2020-11-02 VITALS — BP 159/66 | HR 73 | Temp 98.7°F | Resp 17

## 2020-11-02 DIAGNOSIS — Z8673 Personal history of transient ischemic attack (TIA), and cerebral infarction without residual deficits: Secondary | ICD-10-CM | POA: Diagnosis not present

## 2020-11-02 DIAGNOSIS — E1140 Type 2 diabetes mellitus with diabetic neuropathy, unspecified: Secondary | ICD-10-CM | POA: Insufficient documentation

## 2020-11-02 DIAGNOSIS — Z95828 Presence of other vascular implants and grafts: Secondary | ICD-10-CM

## 2020-11-02 DIAGNOSIS — E1122 Type 2 diabetes mellitus with diabetic chronic kidney disease: Secondary | ICD-10-CM | POA: Insufficient documentation

## 2020-11-02 DIAGNOSIS — Z923 Personal history of irradiation: Secondary | ICD-10-CM | POA: Insufficient documentation

## 2020-11-02 DIAGNOSIS — C9000 Multiple myeloma not having achieved remission: Secondary | ICD-10-CM

## 2020-11-02 DIAGNOSIS — Z7189 Other specified counseling: Secondary | ICD-10-CM

## 2020-11-02 DIAGNOSIS — Z853 Personal history of malignant neoplasm of breast: Secondary | ICD-10-CM | POA: Diagnosis not present

## 2020-11-02 DIAGNOSIS — Z87891 Personal history of nicotine dependence: Secondary | ICD-10-CM | POA: Insufficient documentation

## 2020-11-02 DIAGNOSIS — D509 Iron deficiency anemia, unspecified: Secondary | ICD-10-CM

## 2020-11-02 DIAGNOSIS — I252 Old myocardial infarction: Secondary | ICD-10-CM | POA: Insufficient documentation

## 2020-11-02 DIAGNOSIS — N189 Chronic kidney disease, unspecified: Secondary | ICD-10-CM | POA: Insufficient documentation

## 2020-11-02 DIAGNOSIS — Z5112 Encounter for antineoplastic immunotherapy: Secondary | ICD-10-CM | POA: Insufficient documentation

## 2020-11-02 LAB — CMP (CANCER CENTER ONLY)
ALT: 10 U/L (ref 0–44)
AST: 11 U/L — ABNORMAL LOW (ref 15–41)
Albumin: 3.5 g/dL (ref 3.5–5.0)
Alkaline Phosphatase: 69 U/L (ref 38–126)
Anion gap: 13 (ref 5–15)
BUN: 20 mg/dL (ref 8–23)
CO2: 21 mmol/L — ABNORMAL LOW (ref 22–32)
Calcium: 9 mg/dL (ref 8.9–10.3)
Chloride: 108 mmol/L (ref 98–111)
Creatinine: 1.52 mg/dL — ABNORMAL HIGH (ref 0.44–1.00)
GFR, Estimated: 35 mL/min — ABNORMAL LOW (ref >60)
Glucose, Bld: 105 mg/dL — ABNORMAL HIGH (ref 70–99)
Potassium: 4 mmol/L (ref 3.5–5.1)
Sodium: 142 mmol/L (ref 135–145)
Total Bilirubin: 0.2 mg/dL — ABNORMAL LOW (ref 0.3–1.2)
Total Protein: 6.3 g/dL — ABNORMAL LOW (ref 6.5–8.1)

## 2020-11-02 LAB — CBC WITH DIFFERENTIAL/PLATELET
Abs Immature Granulocytes: 0.03 10*3/uL (ref 0.00–0.07)
Basophils Absolute: 0 10*3/uL (ref 0.0–0.1)
Basophils Relative: 0 %
Eosinophils Absolute: 0 10*3/uL (ref 0.0–0.5)
Eosinophils Relative: 1 %
HCT: 24.5 % — ABNORMAL LOW (ref 36.0–46.0)
Hemoglobin: 8.2 g/dL — ABNORMAL LOW (ref 12.0–15.0)
Immature Granulocytes: 1 %
Lymphocytes Relative: 28 %
Lymphs Abs: 1.3 10*3/uL (ref 0.7–4.0)
MCH: 32.5 pg (ref 26.0–34.0)
MCHC: 33.5 g/dL (ref 30.0–36.0)
MCV: 97.2 fL (ref 80.0–100.0)
Monocytes Absolute: 0.3 10*3/uL (ref 0.1–1.0)
Monocytes Relative: 6 %
Neutro Abs: 3 10*3/uL (ref 1.7–7.7)
Neutrophils Relative %: 64 %
Platelets: 221 10*3/uL (ref 150–400)
RBC: 2.52 MIL/uL — ABNORMAL LOW (ref 3.87–5.11)
RDW: 15 % (ref 11.5–15.5)
WBC: 4.7 10*3/uL (ref 4.0–10.5)
nRBC: 0 % (ref 0.0–0.2)

## 2020-11-02 MED ORDER — SODIUM CHLORIDE 0.9 % IV SOLN
16.0000 mg/kg | Freq: Once | INTRAVENOUS | Status: AC
  Administered 2020-11-02: 1400 mg via INTRAVENOUS
  Filled 2020-11-02: qty 60

## 2020-11-02 MED ORDER — ACETAMINOPHEN 325 MG PO TABS
ORAL_TABLET | ORAL | Status: AC
  Filled 2020-11-02: qty 2

## 2020-11-02 MED ORDER — ZOLEDRONIC ACID 4 MG/100ML IV SOLN
INTRAVENOUS | Status: AC
  Filled 2020-11-02: qty 100

## 2020-11-02 MED ORDER — SODIUM CHLORIDE 0.9% FLUSH
10.0000 mL | INTRAVENOUS | Status: DC | PRN
  Administered 2020-11-02: 10 mL
  Filled 2020-11-02: qty 10

## 2020-11-02 MED ORDER — CYANOCOBALAMIN 1000 MCG/ML IJ SOLN
INTRAMUSCULAR | Status: AC
  Filled 2020-11-02: qty 1

## 2020-11-02 MED ORDER — SODIUM CHLORIDE 0.9 % IV SOLN
Freq: Once | INTRAVENOUS | Status: DC
  Filled 2020-11-02: qty 250

## 2020-11-02 MED ORDER — DIPHENHYDRAMINE HCL 25 MG PO CAPS
50.0000 mg | ORAL_CAPSULE | Freq: Once | ORAL | Status: AC
Start: 2020-11-02 — End: 2020-11-02
  Administered 2020-11-02: 50 mg via ORAL

## 2020-11-02 MED ORDER — SODIUM CHLORIDE 0.9% FLUSH
10.0000 mL | Freq: Once | INTRAVENOUS | Status: AC
  Administered 2020-11-02: 10 mL
  Filled 2020-11-02: qty 10

## 2020-11-02 MED ORDER — HEPARIN SOD (PORK) LOCK FLUSH 100 UNIT/ML IV SOLN
500.0000 [IU] | Freq: Once | INTRAVENOUS | Status: AC | PRN
  Administered 2020-11-02: 500 [IU]
  Filled 2020-11-02: qty 5

## 2020-11-02 MED ORDER — CYANOCOBALAMIN 1000 MCG/ML IJ SOLN
1000.0000 ug | Freq: Once | INTRAMUSCULAR | Status: AC
  Administered 2020-11-02: 1000 ug via SUBCUTANEOUS

## 2020-11-02 MED ORDER — DEXAMETHASONE SODIUM PHOSPHATE 100 MG/10ML IJ SOLN
20.0000 mg | Freq: Once | INTRAMUSCULAR | Status: AC
  Administered 2020-11-02: 20 mg via INTRAVENOUS
  Filled 2020-11-02: qty 20

## 2020-11-02 MED ORDER — DIPHENHYDRAMINE HCL 25 MG PO CAPS
ORAL_CAPSULE | ORAL | Status: AC
  Filled 2020-11-02: qty 2

## 2020-11-02 MED ORDER — SODIUM CHLORIDE 0.9 % IV SOLN
Freq: Once | INTRAVENOUS | Status: AC
  Filled 2020-11-02: qty 250

## 2020-11-02 MED ORDER — ZOLEDRONIC ACID 4 MG/100ML IV SOLN
4.0000 mg | Freq: Once | INTRAVENOUS | Status: AC
  Administered 2020-11-02: 4 mg via INTRAVENOUS

## 2020-11-02 MED ORDER — FAMOTIDINE IN NACL 20-0.9 MG/50ML-% IV SOLN
INTRAVENOUS | Status: AC
  Filled 2020-11-02: qty 50

## 2020-11-02 MED ORDER — ACETAMINOPHEN 325 MG PO TABS
650.0000 mg | ORAL_TABLET | Freq: Once | ORAL | Status: AC
  Administered 2020-11-02: 650 mg via ORAL

## 2020-11-02 MED ORDER — FAMOTIDINE IN NACL 20-0.9 MG/50ML-% IV SOLN
20.0000 mg | Freq: Once | INTRAVENOUS | Status: AC
Start: 2020-11-02 — End: 2020-11-02
  Administered 2020-11-02: 20 mg via INTRAVENOUS

## 2020-11-02 NOTE — Patient Instructions (Signed)
[facility] Discharge Instructions for Patients Receiving Chemotherapy  Today you received the following chemotherapy agents Daratumumab (DARZALEX).  To help prevent nausea and vomiting after your treatment, we encourage you to take your nausea medication as prescribed.   If you develop nausea and vomiting that is not controlled by your nausea medication, call the clinic.   BELOW ARE SYMPTOMS THAT SHOULD BE REPORTED IMMEDIATELY:  *FEVER GREATER THAN 100.5 F  *CHILLS WITH OR WITHOUT FEVER  NAUSEA AND VOMITING THAT IS NOT CONTROLLED WITH YOUR NAUSEA MEDICATION  *UNUSUAL SHORTNESS OF BREATH  *UNUSUAL BRUISING OR BLEEDING  TENDERNESS IN MOUTH AND THROAT WITH OR WITHOUT PRESENCE OF ULCERS  *URINARY PROBLEMS  *BOWEL PROBLEMS  UNUSUAL RASH Items with * indicate a potential emergency and should be followed up as soon as possible.  Feel free to call the clinic should you have any questions or concerns. The clinic phone number is [phone].  Please show the [place] at check-in to the Emergency Department and triage nurse.

## 2020-11-02 NOTE — Patient Instructions (Signed)
Implanted Port Insertion, Care After This sheet gives you information about how to care for yourself after your procedure. Your health care provider may also give you more specific instructions. If you have problems or questions, contact your health care provider. What can I expect after the procedure? After the procedure, it is common to have:  Discomfort at the port insertion site.  Bruising on the skin over the port. This should improve over 3-4 days. Follow these instructions at home: Port care  After your port is placed, you will get a manufacturer's information card. The card has information about your port. Keep this card with you at all times.  Take care of the port as told by your health care provider. Ask your health care provider if you or a family member can get training for taking care of the port at home. A home health care nurse may also take care of the port.  Make sure to remember what type of port you have. Incision care  Follow instructions from your health care provider about how to take care of your port insertion site. Make sure you: ? Wash your hands with soap and water before and after you change your bandage (dressing). If soap and water are not available, use hand sanitizer. ? Change your dressing as told by your health care provider. ? Leave stitches (sutures), skin glue, or adhesive strips in place. These skin closures may need to stay in place for 2 weeks or longer. If adhesive strip edges start to loosen and curl up, you may trim the loose edges. Do not remove adhesive strips completely unless your health care provider tells you to do that.  Check your port insertion site every day for signs of infection. Check for: ? Redness, swelling, or pain. ? Fluid or blood. ? Warmth. ? Pus or a bad smell.      Activity  Return to your normal activities as told by your health care provider. Ask your health care provider what activities are safe for you.  Do not  lift anything that is heavier than 10 lb (4.5 kg), or the limit that you are told, until your health care provider says that it is safe. General instructions  Take over-the-counter and prescription medicines only as told by your health care provider.  Do not take baths, swim, or use a hot tub until your health care provider approves. Ask your health care provider if you may take showers. You may only be allowed to take sponge baths.  Do not drive for 24 hours if you were given a sedative during your procedure.  Wear a medical alert bracelet in case of an emergency. This will tell any health care providers that you have a port.  Keep all follow-up visits as told by your health care provider. This is important. Contact a health care provider if:  You cannot flush your port with saline as directed, or you cannot draw blood from the port.  You have a fever or chills.  You have redness, swelling, or pain around your port insertion site.  You have fluid or blood coming from your port insertion site.  Your port insertion site feels warm to the touch.  You have pus or a bad smell coming from the port insertion site. Get help right away if:  You have chest pain or shortness of breath.  You have bleeding from your port that you cannot control. Summary  Take care of the port as told by your   health care provider. Keep the manufacturer's information card with you at all times.  Change your dressing as told by your health care provider.  Contact a health care provider if you have a fever or chills or if you have redness, swelling, or pain around your port insertion site.  Keep all follow-up visits as told by your health care provider. This information is not intended to replace advice given to you by your health care provider. Make sure you discuss any questions you have with your health care provider. Document Revised: 02/10/2018 Document Reviewed: 02/10/2018 Elsevier Patient Education   2021 [employer]  

## 2020-11-05 ENCOUNTER — Other Ambulatory Visit: Payer: Self-pay | Admitting: Family Medicine

## 2020-11-06 LAB — MULTIPLE MYELOMA PANEL, SERUM
Albumin SerPl Elph-Mcnc: 3.5 g/dL (ref 2.9–4.4)
Albumin/Glob SerPl: 1.3 (ref 0.7–1.7)
Alpha 1: 0.2 g/dL (ref 0.0–0.4)
Alpha2 Glob SerPl Elph-Mcnc: 1.3 g/dL — ABNORMAL HIGH (ref 0.4–1.0)
B-Globulin SerPl Elph-Mcnc: 0.9 g/dL (ref 0.7–1.3)
Gamma Glob SerPl Elph-Mcnc: 0.3 g/dL — ABNORMAL LOW (ref 0.4–1.8)
Globulin, Total: 2.7 g/dL (ref 2.2–3.9)
IgA: 44 mg/dL — ABNORMAL LOW (ref 64–422)
IgG (Immunoglobin G), Serum: 384 mg/dL — ABNORMAL LOW (ref 586–1602)
IgM (Immunoglobulin M), Srm: 33 mg/dL (ref 26–217)
M Protein SerPl Elph-Mcnc: 0.2 g/dL — ABNORMAL HIGH
Total Protein ELP: 6.2 g/dL (ref 6.0–8.5)

## 2020-11-09 ENCOUNTER — Other Ambulatory Visit: Payer: Self-pay | Admitting: Family Medicine

## 2020-11-14 ENCOUNTER — Encounter: Payer: Medicare Other | Attending: Psychology | Admitting: Psychology

## 2020-11-16 ENCOUNTER — Other Ambulatory Visit: Payer: Self-pay

## 2020-11-16 ENCOUNTER — Inpatient Hospital Stay: Payer: Medicare Other

## 2020-11-16 ENCOUNTER — Other Ambulatory Visit: Payer: Medicare Other

## 2020-11-16 VITALS — BP 146/64 | HR 90 | Temp 98.5°F | Resp 18

## 2020-11-16 DIAGNOSIS — Z853 Personal history of malignant neoplasm of breast: Secondary | ICD-10-CM | POA: Diagnosis not present

## 2020-11-16 DIAGNOSIS — C9000 Multiple myeloma not having achieved remission: Secondary | ICD-10-CM | POA: Diagnosis not present

## 2020-11-16 DIAGNOSIS — Z95828 Presence of other vascular implants and grafts: Secondary | ICD-10-CM

## 2020-11-16 DIAGNOSIS — D509 Iron deficiency anemia, unspecified: Secondary | ICD-10-CM

## 2020-11-16 DIAGNOSIS — Z5112 Encounter for antineoplastic immunotherapy: Secondary | ICD-10-CM

## 2020-11-16 DIAGNOSIS — Z923 Personal history of irradiation: Secondary | ICD-10-CM | POA: Diagnosis not present

## 2020-11-16 DIAGNOSIS — I252 Old myocardial infarction: Secondary | ICD-10-CM | POA: Diagnosis not present

## 2020-11-16 DIAGNOSIS — E1122 Type 2 diabetes mellitus with diabetic chronic kidney disease: Secondary | ICD-10-CM | POA: Diagnosis not present

## 2020-11-16 DIAGNOSIS — N189 Chronic kidney disease, unspecified: Secondary | ICD-10-CM | POA: Diagnosis not present

## 2020-11-16 DIAGNOSIS — Z87891 Personal history of nicotine dependence: Secondary | ICD-10-CM | POA: Diagnosis not present

## 2020-11-16 DIAGNOSIS — Z7189 Other specified counseling: Secondary | ICD-10-CM

## 2020-11-16 DIAGNOSIS — Z8673 Personal history of transient ischemic attack (TIA), and cerebral infarction without residual deficits: Secondary | ICD-10-CM | POA: Diagnosis not present

## 2020-11-16 DIAGNOSIS — E1140 Type 2 diabetes mellitus with diabetic neuropathy, unspecified: Secondary | ICD-10-CM | POA: Diagnosis not present

## 2020-11-16 LAB — CMP (CANCER CENTER ONLY)
ALT: 9 U/L (ref 0–44)
AST: 12 U/L — ABNORMAL LOW (ref 15–41)
Albumin: 3.4 g/dL — ABNORMAL LOW (ref 3.5–5.0)
Alkaline Phosphatase: 82 U/L (ref 38–126)
Anion gap: 11 (ref 5–15)
BUN: 15 mg/dL (ref 8–23)
CO2: 21 mmol/L — ABNORMAL LOW (ref 22–32)
Calcium: 8.6 mg/dL — ABNORMAL LOW (ref 8.9–10.3)
Chloride: 113 mmol/L — ABNORMAL HIGH (ref 98–111)
Creatinine: 1.72 mg/dL — ABNORMAL HIGH (ref 0.44–1.00)
GFR, Estimated: 30 mL/min — ABNORMAL LOW (ref >60)
Glucose, Bld: 116 mg/dL — ABNORMAL HIGH (ref 70–99)
Potassium: 4.3 mmol/L (ref 3.5–5.1)
Sodium: 145 mmol/L (ref 135–145)
Total Bilirubin: <0.2 mg/dL — ABNORMAL LOW (ref 0.3–1.2)
Total Protein: 6.4 g/dL — ABNORMAL LOW (ref 6.5–8.1)

## 2020-11-16 LAB — CBC WITH DIFFERENTIAL/PLATELET
Abs Immature Granulocytes: 0.02 10*3/uL (ref 0.00–0.07)
Basophils Absolute: 0 10*3/uL (ref 0.0–0.1)
Basophils Relative: 0 %
Eosinophils Absolute: 0.1 10*3/uL (ref 0.0–0.5)
Eosinophils Relative: 1 %
HCT: 24 % — ABNORMAL LOW (ref 36.0–46.0)
Hemoglobin: 7.8 g/dL — ABNORMAL LOW (ref 12.0–15.0)
Immature Granulocytes: 0 %
Lymphocytes Relative: 23 %
Lymphs Abs: 1.3 10*3/uL (ref 0.7–4.0)
MCH: 32.4 pg (ref 26.0–34.0)
MCHC: 32.5 g/dL (ref 30.0–36.0)
MCV: 99.6 fL (ref 80.0–100.0)
Monocytes Absolute: 0.4 10*3/uL (ref 0.1–1.0)
Monocytes Relative: 6 %
Neutro Abs: 4 10*3/uL (ref 1.7–7.7)
Neutrophils Relative %: 70 %
Platelets: 249 10*3/uL (ref 150–400)
RBC: 2.41 MIL/uL — ABNORMAL LOW (ref 3.87–5.11)
RDW: 14 % (ref 11.5–15.5)
WBC: 5.8 10*3/uL (ref 4.0–10.5)
nRBC: 0 % (ref 0.0–0.2)

## 2020-11-16 MED ORDER — SODIUM CHLORIDE 0.9 % IV SOLN
Freq: Once | INTRAVENOUS | Status: AC
  Filled 2020-11-16: qty 250

## 2020-11-16 MED ORDER — DIPHENHYDRAMINE HCL 25 MG PO CAPS
50.0000 mg | ORAL_CAPSULE | Freq: Once | ORAL | Status: AC
  Administered 2020-11-16: 50 mg via ORAL

## 2020-11-16 MED ORDER — CYANOCOBALAMIN 1000 MCG/ML IJ SOLN
INTRAMUSCULAR | Status: AC
  Filled 2020-11-16: qty 1

## 2020-11-16 MED ORDER — CYANOCOBALAMIN 1000 MCG/ML IJ SOLN
1000.0000 ug | Freq: Once | INTRAMUSCULAR | Status: AC
Start: 2020-11-16 — End: 2020-11-16
  Administered 2020-11-16: 1000 ug via SUBCUTANEOUS

## 2020-11-16 MED ORDER — FAMOTIDINE IN NACL 20-0.9 MG/50ML-% IV SOLN
INTRAVENOUS | Status: AC
  Filled 2020-11-16: qty 50

## 2020-11-16 MED ORDER — SODIUM CHLORIDE 0.9% FLUSH
10.0000 mL | INTRAVENOUS | Status: DC | PRN
  Administered 2020-11-16: 10 mL
  Filled 2020-11-16: qty 10

## 2020-11-16 MED ORDER — ACETAMINOPHEN 325 MG PO TABS
650.0000 mg | ORAL_TABLET | Freq: Once | ORAL | Status: AC
  Administered 2020-11-16: 650 mg via ORAL

## 2020-11-16 MED ORDER — HEPARIN SOD (PORK) LOCK FLUSH 100 UNIT/ML IV SOLN
500.0000 [IU] | Freq: Once | INTRAVENOUS | Status: AC | PRN
  Administered 2020-11-16: 500 [IU]
  Filled 2020-11-16: qty 5

## 2020-11-16 MED ORDER — SODIUM CHLORIDE 0.9 % IV SOLN
20.0000 mg | Freq: Once | INTRAVENOUS | Status: AC
  Administered 2020-11-16: 20 mg via INTRAVENOUS
  Filled 2020-11-16: qty 20

## 2020-11-16 MED ORDER — ACETAMINOPHEN 325 MG PO TABS
ORAL_TABLET | ORAL | Status: AC
  Filled 2020-11-16: qty 2

## 2020-11-16 MED ORDER — DIPHENHYDRAMINE HCL 25 MG PO CAPS
ORAL_CAPSULE | ORAL | Status: AC
  Filled 2020-11-16: qty 2

## 2020-11-16 MED ORDER — SODIUM CHLORIDE 0.9 % IV SOLN
16.0000 mg/kg | Freq: Once | INTRAVENOUS | Status: AC
  Administered 2020-11-16: 1400 mg via INTRAVENOUS
  Filled 2020-11-16: qty 60

## 2020-11-16 MED ORDER — FAMOTIDINE IN NACL 20-0.9 MG/50ML-% IV SOLN
20.0000 mg | Freq: Once | INTRAVENOUS | Status: AC
  Administered 2020-11-16: 20 mg via INTRAVENOUS

## 2020-11-16 NOTE — Patient Instructions (Signed)
[facility] Discharge Instructions for Patients Receiving Chemotherapy  Today you received the following chemotherapy agents Daratumumab (DARZALEX).  To help prevent nausea and vomiting after your treatment, we encourage you to take your nausea medication as prescribed.   If you develop nausea and vomiting that is not controlled by your nausea medication, call the clinic.   BELOW ARE SYMPTOMS THAT SHOULD BE REPORTED IMMEDIATELY:  *FEVER GREATER THAN 100.5 F  *CHILLS WITH OR WITHOUT FEVER  NAUSEA AND VOMITING THAT IS NOT CONTROLLED WITH YOUR NAUSEA MEDICATION  *UNUSUAL SHORTNESS OF BREATH  *UNUSUAL BRUISING OR BLEEDING  TENDERNESS IN MOUTH AND THROAT WITH OR WITHOUT PRESENCE OF ULCERS  *URINARY PROBLEMS  *BOWEL PROBLEMS  UNUSUAL RASH Items with * indicate a potential emergency and should be followed up as soon as possible.  Feel free to call the clinic should you have any questions or concerns. The clinic phone number is [phone].  Please show the [place] at check-in to the Emergency Department and triage nurse.

## 2020-11-16 NOTE — Progress Notes (Signed)
OK to treat with HGB of 7.8 per Dr. Lorenso Courier

## 2020-11-16 NOTE — Progress Notes (Signed)
During visit, patient denined symptoms of low hgb and did not want to get a blood transfusion. RN encouraged patient to notify Dr. Grier Mitts nurse if she begins to have symptoms of anemia this upcoming week, she verbalized understanding. Patient had no complaints upon departure and vitals were stable upon leaving infusion room.

## 2020-11-16 NOTE — Patient Instructions (Signed)
Implanted Port Insertion, Care After This sheet gives you information about how to care for yourself after your procedure. Your health care provider may also give you more specific instructions. If you have problems or questions, contact your health care provider. What can I expect after the procedure? After the procedure, it is common to have:  Discomfort at the port insertion site.  Bruising on the skin over the port. This should improve over 3-4 days. Follow these instructions at home: Port care  After your port is placed, you will get a manufacturer's information card. The card has information about your port. Keep this card with you at all times.  Take care of the port as told by your health care provider. Ask your health care provider if you or a family member can get training for taking care of the port at home. A home health care nurse may also take care of the port.  Make sure to remember what type of port you have. Incision care  Follow instructions from your health care provider about how to take care of your port insertion site. Make sure you: ? Wash your hands with soap and water before and after you change your bandage (dressing). If soap and water are not available, use hand sanitizer. ? Change your dressing as told by your health care provider. ? Leave stitches (sutures), skin glue, or adhesive strips in place. These skin closures may need to stay in place for 2 weeks or longer. If adhesive strip edges start to loosen and curl up, you may trim the loose edges. Do not remove adhesive strips completely unless your health care provider tells you to do that.  Check your port insertion site every day for signs of infection. Check for: ? Redness, swelling, or pain. ? Fluid or blood. ? Warmth. ? Pus or a bad smell.      Activity  Return to your normal activities as told by your health care provider. Ask your health care provider what activities are safe for you.  Do not  lift anything that is heavier than 10 lb (4.5 kg), or the limit that you are told, until your health care provider says that it is safe. General instructions  Take over-the-counter and prescription medicines only as told by your health care provider.  Do not take baths, swim, or use a hot tub until your health care provider approves. Ask your health care provider if you may take showers. You may only be allowed to take sponge baths.  Do not drive for 24 hours if you were given a sedative during your procedure.  Wear a medical alert bracelet in case of an emergency. This will tell any health care providers that you have a port.  Keep all follow-up visits as told by your health care provider. This is important. Contact a health care provider if:  You cannot flush your port with saline as directed, or you cannot draw blood from the port.  You have a fever or chills.  You have redness, swelling, or pain around your port insertion site.  You have fluid or blood coming from your port insertion site.  Your port insertion site feels warm to the touch.  You have pus or a bad smell coming from the port insertion site. Get help right away if:  You have chest pain or shortness of breath.  You have bleeding from your port that you cannot control. Summary  Take care of the port as told by your   health care provider. Keep the manufacturer's information card with you at all times.  Change your dressing as told by your health care provider.  Contact a health care provider if you have a fever or chills or if you have redness, swelling, or pain around your port insertion site.  Keep all follow-up visits as told by your health care provider. This information is not intended to replace advice given to you by your health care provider. Make sure you discuss any questions you have with your health care provider. Document Revised: 02/10/2018 Document Reviewed: 02/10/2018 Elsevier Patient Education   2021 [employer]  

## 2020-11-20 ENCOUNTER — Encounter: Payer: Self-pay | Admitting: Family Medicine

## 2020-11-20 ENCOUNTER — Other Ambulatory Visit: Payer: Self-pay

## 2020-11-20 ENCOUNTER — Telehealth (INDEPENDENT_AMBULATORY_CARE_PROVIDER_SITE_OTHER): Payer: Medicare Other | Admitting: Family Medicine

## 2020-11-20 VITALS — BP 130/70 | Temp 97.5°F

## 2020-11-20 DIAGNOSIS — J019 Acute sinusitis, unspecified: Secondary | ICD-10-CM

## 2020-11-20 DIAGNOSIS — J45909 Unspecified asthma, uncomplicated: Secondary | ICD-10-CM

## 2020-11-20 MED ORDER — DEXAMETHASONE 2 MG PO TABS: ORAL_TABLET | ORAL | 0 refills | Status: DC

## 2020-11-20 NOTE — Progress Notes (Signed)
I connected with  Tracy [name] on 11/20/20 by a video enabled telemedicine application and verified that I am speaking with the correct person using two identifiers.   I discussed the limitations of evaluation and management by telemedicine. The patient expressed understanding and agreed to proceed.

## 2020-11-20 NOTE — Progress Notes (Signed)
Virtual Visit via Video   I connected with patient on 11/20/20 at  2:30 PM EDT by a video enabled telemedicine application and verified that I am speaking with the correct person using two identifiers.  Location patient: Home Location provider: [facility], Office Persons participating in the virtual visit: Patient, Provider, [place] (Sabrina M)  I discussed the limitations of evaluation and management by telemedicine and the availability of in person appointments. The patient expressed understanding and agreed to proceed.  Subjective:   HPI:   URI- pt is taking Claritin daily.  Starting Saturday, she had increased nasal drainage.  Yesterday w/ copious PND which made her feel somewhat short of breath.  Mild dizziness.  Denies facial pain/pressure.  Some mild HA 'around the eyes'.  + head/facial pain w/ cough.  + nausea due to PND.  Pt uses Nasonex periodically but not since she started feeling badly.  No fever- Temp 97.5  No one sick around her.  ROS:   See pertinent positives and negatives per HPI.  Patient Active Problem List   Diagnosis Date Noted  . Angiodysplasia of intestine 08/23/2020  . Port-A-Cath in place 08/04/2020  . Counseling regarding advance care planning and goals of care 02/07/2020  . Multiple myeloma ([place]) 01/25/2020  . Dizziness 10/12/2019  . Post-nasal drainage 10/12/2019  . Allergic rhinitis 08/19/2018  . Type 2 diabetes mellitus with diabetic neuropathy, unspecified ([place]) 11/05/2017  . Encounter for long-term use of muscle relaxants 09/24/2017  . CAD in native artery 09/24/2017  . OSA (obstructive sleep apnea) 09/24/2017  . Snorings 09/24/2017  . Asthenia 06/30/2017  . Morbid obesity ([place]) 06/02/2017  . Depression 06/02/2017  . Iron deficiency anemia 09/23/2016  . Anemia of chronic disease 09/28/2015  . Genetic testing 08/21/2015  . History of left breast cancer 07/13/2015  . History of right breast cancer 06/20/2015  . Hearing loss due to  cerumen impaction 12/29/2014  . Allergy to adhesive tape 05/19/2014  . Hyperlipidemia 12/06/2013  . GERD (gastroesophageal reflux disease) 12/06/2013  . Cervical disc disease 11/11/2013  . Osteopenia 05/25/2013  . Allergic asthma 12/24/2012  . Insomnia 04/02/2012  . Physical exam, annual 04/02/2012  . HTN (hypertension) 02/03/2012  . Vertigo, benign positional 02/03/2012  . Left groin pain 02/03/2012  . Hip pain 10/10/2011  . TIA (transient ischemic attack) 07/24/2011  . Allergic reaction 07/05/2011  . Osteoarthrosis involving lower leg 10/19/2010  . Disorder of bone and cartilage 05/01/2010  . Generalized anxiety disorder 05/01/2010  . Vitamin D deficiency 02/20/2010  . Unspecified chronic bronchitis ([place]) 08/24/2009  . Other lymphedema 10/29/2007    Social History   Tobacco Use  . Smoking status: Former Smoker    Packs/day: 1.00    Years: 20.00    Pack years: 20.00    Types: Cigarettes    Quit date: 07/30/2011    Years since quitting: 9.3  . Smokeless tobacco: Never Used  . Tobacco comment: Quit >4 years ago; 1 ppd for about 5/20 years (remaining was less)  Substance Use Topics  . Alcohol use: No    Alcohol/week: 0.0 standard drinks    Current Outpatient Medications:  .  Accu-Chek Softclix Lancets lancets, Use as instructed to check sugars 1-2 times daily., Disp: 100 each, Rfl: 12 .  albuterol (VENTOLIN HFA) 108 (90 Base) MCG/ACT inhaler, Inhale 2 puffs into the lungs every 6 (six) hours as needed for wheezing or shortness of breath., Disp: 8 g, Rfl: 2 .  atorvastatin (LIPITOR) 10 MG tablet,  TAKE 1 TABLET BY MOUTH  DAILY, Disp: 90 tablet, Rfl: 3 .  dexamethasone (DECADRON) 4 MG tablet, Take 3 tabs with breakfast the day after each treatment., Disp: 20 tablet, Rfl: 4 .  ergocalciferol (VITAMIN D2) 1.25 MG (50000 UT) capsule, Take 1 capsule (50,000 Units total) by mouth once a week., Disp: 12 capsule, Rfl: 2 .  fluticasone (FLOVENT HFA) 110 MCG/ACT inhaler, Inhale 2 puffs  into the lungs in the morning and at bedtime., Disp: 1 each, Rfl: 12 .  glucose blood (ACCU-CHEK GUIDE) test strip, Use as instructed to check sugars 1-2 times daily., Disp: 100 each, Rfl: 12 .  lidocaine-prilocaine (EMLA) cream, APPLY 1 APPLICATION TOPICALLY AS NEEDED., Disp: 30 g, Rfl: 0 .  loratadine (CLARITIN) 10 MG tablet, Take 10 mg by mouth daily., Disp: , Rfl:  .  losartan (COZAAR) 100 MG tablet, TAKE 1 TABLET BY MOUTH  DAILY, Disp: 90 tablet, Rfl: 3 .  metFORMIN (GLUCOPHAGE) 500 MG tablet, TAKE 1 TABLET(500 MG) BY MOUTH TWICE DAILY WITH A MEAL, Disp: 180 tablet, Rfl: 0 .  mometasone (NASONEX) 50 MCG/ACT nasal spray, Place 2 sprays into the nose daily. (Patient taking differently: Place 2 sprays into the nose as needed.), Disp: 17 g, Rfl: 12 .  ondansetron (ZOFRAN) 8 MG tablet, Take 1 tablet (8 mg total) by mouth 2 (two) times daily as needed (Nausea or vomiting)., Disp: 30 tablet, Rfl: 1 .  pantoprazole (PROTONIX) 40 MG tablet, TAKE 1 TABLET BY MOUTH  TWICE DAILY, Disp: 180 tablet, Rfl: 1 .  potassium chloride SA (KLOR-CON) 20 MEQ tablet, TAKE 1 TABLET(20 MEQ) BY MOUTH TWICE DAILY, Disp: 60 tablet, Rfl: 0 .  pregabalin (LYRICA) 50 MG capsule, TAKE 1 CAPSULE(50 MG) BY MOUTH THREE TIMES DAILY, Disp: 90 capsule, Rfl: 3 .  prochlorperazine (COMPAZINE) 10 MG tablet, Take 1 tablet (10 mg total) by mouth every 6 (six) hours as needed (Nausea or vomiting)., Disp: 30 tablet, Rfl: 1 .  tiZANidine (ZANAFLEX) 4 MG tablet, TAKE 1 TABLET(4 MG) BY MOUTH AT BEDTIME, Disp: 30 tablet, Rfl: 2 .  traZODone (DESYREL) 100 MG tablet, TAKE 1 TABLET BY MOUTH AT  BEDTIME, Disp: 90 tablet, Rfl: 2 .  acyclovir (ZOVIRAX) 400 MG tablet, Take 1 tablet (400 mg total) by mouth 2 (two) times daily. (Patient not taking: Reported on 11/20/2020), Disp: 60 tablet, Rfl: 11 .  aspirin 81 MG tablet, Take 81 mg by mouth daily. (Patient not taking: Reported on 11/20/2020), Disp: , Rfl:   Allergies  Allergen Reactions  .  Bacitracin-Neomycin-Polymyxin  [Neomycin-Bacitracin Zn-Polymyx] Swelling  . Nsaids Other (See Comments)    nosebleeds  . Tape Hives    Pt cannot tolerate bandaids, tape, or any other adhesives.   . Ambien [Zolpidem Tartrate]     Hives   . Amoxicillin     Rash   . Clavulanic Acid Hives  . Contrast Media [Iodinated Diagnostic Agents] Hives    Pt states hives with prior ct, was given benadryl to resolve  . Latex Swelling  . Prednisone     Pt tolerates Dexamethasone. Pt cannot recall what happens when she takes Prednisone    Objective:   BP 130/70   Temp (!) 97.5 F (36.4 C) (Temporal)  AAOx3, NAD NCAT, EOMI No obvious CN deficits Coloring WNL Pt is able to speak clearly, coherently without shortness of breath or increased work of breathing.  + hacking cough x1 Thought process is linear.  Mood is appropriate.   Assessment and Plan:  Acute allergic sinusitis/asthma- new/deteriorated.  Pt has hx of of allergic asthma and allergic rhinitis.  She is taking her Claritin daily but has not been using nasal steroids recently due to nose bleeds.  Sxs do not sound bacterial in nature as they just started on Saturday w/ copious clear drainage.  Start steroid taper- pt is intolerant to prednisone but can take dexamethasone. Encouraged her to use her inhaler as needed. Reviewed supportive care and red flags that should prompt return.  Pt expressed understanding and is in agreement w/ plan.    [name], MD 11/20/2020

## 2020-11-21 ENCOUNTER — Telehealth: Payer: Self-pay | Admitting: Gastroenterology

## 2020-11-21 NOTE — Telephone Encounter (Signed)
Inbound call from patient requesting to schedule an appt with Dr. Ardis Hughs.  We did receive a referral from her oncologist for anemia and gastrointestinal hemorrhage.  Informed patient next availability with Dr. Ardis Hughs is not going to be until June and she does want to see an APP.  Please advise.

## 2020-11-21 NOTE — Telephone Encounter (Signed)
I called and left her a voicemail to call back to schedule hopefully for tomorrow.

## 2020-11-21 NOTE — Telephone Encounter (Signed)
There is an appt open for tomorrow at 230 pm  Can you see if she can make that?

## 2020-11-22 ENCOUNTER — Telehealth: Payer: Self-pay | Admitting: Family Medicine

## 2020-11-22 MED ORDER — DOXYCYCLINE HYCLATE 100 MG PO TABS: 100.0000 mg | ORAL_TABLET | Freq: Two times a day (BID) | ORAL | 0 refills | Status: DC

## 2020-11-22 NOTE — Telephone Encounter (Signed)
She is on dexamethasone for airway inflammation.  She should use her albuterol inhaler as needed.  We can send in Doxycycline in case of bacterial infxn (prescription sent).  She should take OTC Mucinex DM or Delsym for cough.  If these things aren't helping, she will need to call Pulmonary for an appt

## 2020-11-22 NOTE — Telephone Encounter (Signed)
Called patient with PCP recommendations. Patient voiced understanding. Will reach out to pulmonary if needed.

## 2020-11-22 NOTE — Telephone Encounter (Signed)
Patient called stating that her cough is getting worse.  She wants to know if there is something else she should be taking - states her breathing has not gotten any better - Please advise

## 2020-11-23 ENCOUNTER — Other Ambulatory Visit: Payer: Self-pay | Admitting: Hematology

## 2020-11-29 ENCOUNTER — Telehealth: Payer: Self-pay | Admitting: Family Medicine

## 2020-11-29 NOTE — Telephone Encounter (Signed)
Pt called in stating that she is having tightness in her legs and feet. She states that at times she is having some weakness. No swelling in legs or feet she states that are soft to the touch. She did say they do feel very heavy. She stopped taking the Pregabalin today thinkg it could be a side effect from that but wanted to know Dr. Virgil Benedict thoughts on this.  Please advise  Pt can be reached at the home #

## 2020-11-29 NOTE — Telephone Encounter (Signed)
Please have patient triaged b/c I'm concerned about the 'weakness' she mentions

## 2020-11-29 NOTE — Telephone Encounter (Signed)
Spoke with triage and they are going to call patient to follow up with her.

## 2020-11-29 NOTE — Progress Notes (Signed)
HEMATOLOGY/ONCOLOGY CLINIC NOTE  Date of Service: 11/30/2020  Patient Care Team: [name], MD as PCP - General (Family Medicine) [name], MD as Attending Physician (Physical Medicine and Rehabilitation) [name], MD as Consulting Physician (Orthopedic Surgery) [name], MD as Consulting Physician (Otolaryngology) [name], MD as Consulting Physician (Gastroenterology) [name], MD as Consulting Physician (General Surgery) [name], [facility] as Pharmacist (Pharmacist)  CHIEF COMPLAINTS/PURPOSE OF CONSULTATION:  Multiple Myeloma not having achieved remission  HISTORY OF PRESENTING ILLNESS:  Tracy [name] is a wonderful 75 y.o. female who has been referred to [place] by Dr [name] for evaluation and management of Multiple Myeloma. Pt is accompanied today by her daughter in person and other daughter and granddaughter via phone. The pt reports that she is doing well overall.   When pt was first diagnosed with Breast Cancer it was localized in both of her breasts and was considered Stage 1. She then received chemotherapy. However, she declined chemotherapy a second time and chose radiation therapy after recurrence. Pt has been under the surveillance of Dr. [name] after declining antiestrogen therapy.   The pt reports that she was having difficulty breathing so she saw her Pulmonologist, who ordered the work up. Based on the results of the PET/CT pt was sent to Dr. [name] as they were concerned that her breast cancer had spread. Pt has been becoming increasingly anemic over the last year, despite using a PO Iron supplement. She has had a positive Fecal Occult tests, but has been having issues with hemorrhoids and hemorrhoidal bleeding for the last 5-6 months. Pt denies any blood in her stools but notices blood on the tissue after she wipes. Pt has also been having some discomfort in her left hip and lower back. This pain is worsened when she sits down.    Pt has had a chronic cough that has been thought to either be Bronchitis or a result of acid reflux. Pt is planning to receive an Endoscopy to work this up further. She is currently taking Gabapentin for tingling/burning in her extremities. Pt has Type II Diabetes and was previously using Metformin, but discontinued due to concern for liver damage. She is not currently using any medications to control her Diabetes.   Pt had a heart attack in 2001. She initially though that she had a [employer], until the sensation began to travel up her body. She was given Nitroglycerin upon admittance to the hospital. Pt did not require any stents or other interventions. No cause for her heart attack was ever discovered. Pt has been seen by Neurology, Dr. Leta Baptist, who completed imaging on her neck and found a degeneratve disk. Pt has not had a nerve study conducted. Pt has no history of Shingles and has not received the Shingles vaccine. She has been fully vaccinated against he COVID19 virus.    Of note prior to the patient's visit today, pt has had PET/CT ([id number]) completed on 01/20/2020 with results revealing "1. Hypermetabolism corresponding to left acetabular and L5 lytic lesions, with differential considerations of metastatic disease or myeloma. 2. No evidence of hypermetabolic soft tissue primary, metastatic disease, or soft tissue myeloma. 3. Incidental findings, including: Aortic atherosclerosis (ICD10-I70.0) and emphysema (ICD10-J43.9). Hepatic steatosis."   Pt has had Bone Marrow Surgical Pathology ([id number]) completed on 01/17/2020 which revealed "BONE MARROW, ASPIRATE, CLOT, CORE: -Hypercellular bone marrow for age with plasma cell neoplasm PERIPHERAL BLOOD: -Normocytic-normochromic anemia"  Pt has had Left Ischium Biopsy Surgical Pathology  Report [id number]) completed on 01/17/2020 which revealed "Plasma cell neoplasm."  Most recent lab results (01/25/2020) of CBC is as follows:  all values are WNL except for RBC at 2.44, Hgb at 7.5, HCT at 24.0, CO2 at 21, Glucose at 129, Creatinine at 1.38, Total Protein at 10.1, Albumin at 2.8, Total Bilirubin at <0.2, GFR Est Afr Am at 43. 01/25/2020 K/L light chains is as follows: Kappa free light chain at 22.7, Lamda free light chains at 17.7, K/L light chain ratio at 1.28 01/25/2020 MMP is as follows: all values are WNL except for IgG at 5220, Total Protein at 10.1, Alpha2 Glob at 1.2, Gamma Glob at 3.9, M Protein at 3.7, Total Globulin at 6.7, Albumin/Glob at 0.6. 01/25/2020 24-hr UPEP shows all values are WNL  On review of systems, pt reports lower back/left hip pain, unexpected weight loss, abdominal pain, loose stools and denies low appetite, constipation, rashes and any other symptoms.   On PMHx the pt reports Breast Cancer, Breast Lumpectomy, TIA, Restless leg, Myocardial infarction, HTN, HLD, Chronic Bronchitis.  INTERVAL HISTORY:  Tracy [name] is a wonderful 75 y.o. female who is here for evaluation and management of Multiple Myeloma. She is here for maintenance Daratumumab and Dexamethasone. The patient's last visit with [place] was on 10/19/2020. The pt reports that she is doing well overall.   The pt reports that she is very dizzy. She notes this is not a spinning sensation, but more lightheadedness. The pt notes that she has been eating and drinking well. She notes her ankles and calves feel extremely tight. The pt notes she talked to the nursing line at her PCP and she just instructed her to just go to the Urgent Care. The pt notes her legs are not swollen. The pt notes she has called Dr. Ardis Hughs, but has not been able to get an appointment.  Lab results today 11/30/2020 of CBC w/diff and CMP is as follows: all values are WNL except for RBC of 2.69, Hgb of 8.8, HCT of 27.1, MCV of 100.7, CO2 of 20, Glucose of 135, BUN of 26, Creatinine of 1.81, Calcium of 8.8, Total Protein of 6.2, Albumin of 3.3, AST of 9, GFR est of  29. 11/30/2020 MMP - M spike stable at 0.2g/dl  On review of systems, pt reports muscle stiffness, lightheadedness, dizziness and denies decreased appetite, bloody/black stools, bleeding issues,  and any other symptoms.  MEDICAL HISTORY:  Past Medical History:  Diagnosis Date  . Angiodysplasia of intestine 08/23/2020  . Anxiety   . Arthritis   . Breast cancer ([place]) 06/13/15  . Cancer ([place]) 2000   breast cancer  . Chronic bronchitis ([place])   . Chronic bronchitis ([place])   . Hyperlipidemia   . Hypertension   . Myocardial infarction ([place]) 2001  . Personal history of radiation therapy   . Restless leg   . Stroke [facility]) 2004   TIA, no deficits    SURGICAL HISTORY: Past Surgical History:  Procedure Laterality Date  . ABDOMINAL HYSTERECTOMY  1985  . BREAST LUMPECTOMY Left 2000   radiation and chemo  . BREAST LUMPECTOMY Right 2016   radiation  . BREAST SURGERY  2001   lt breast lumpectomy  . COLONOSCOPY WITH ESOPHAGOGASTRODUODENOSCOPY (EGD)  04/2020  . GIVENS CAPSULE STUDY  07/2020  . IR IMAGING GUIDED PORT INSERTION  04/25/2020  . RADIOACTIVE SEED GUIDED PARTIAL MASTECTOMY WITH AXILLARY SENTINEL LYMPH NODE BIOPSY Right 07/07/2015   Procedure: RIGHT RADIOACTIVE SEED GUIDED PARTIAL MASTECTOMY WITH AXILLARY  SENTINEL LYMPH NODE BIOPSY;  Surgeon: Autumn Messing III, MD;  Location: [place];  Service: General;  Laterality: Right;  . SMALL INTESTINE SURGERY    . TUBAL LIGATION      SOCIAL HISTORY: Social History   Socioeconomic History  . Marital status: Divorced    Spouse name: Not on file  . Number of children: 7  . Years of education: Not on file  . Highest education level: Not on file  Occupational History  . Occupation: retired  Tobacco Use  . Smoking status: Former Smoker    Packs/day: 1.00    Years: 20.00    Pack years: 20.00    Types: Cigarettes    Quit date: 07/30/2011    Years since quitting: 9.3  . Smokeless tobacco: Never Used  . Tobacco comment: Quit  >4 years ago; 1 ppd for about 5/20 years (remaining was less)  Vaping Use  . Vaping Use: Former  Substance and Sexual Activity  . Alcohol use: No    Alcohol/week: 0.0 standard drinks  . Drug use: No  . Sexual activity: Not Currently  Other Topics Concern  . Not on file  Social History Narrative   Lives alone.  Retired.  Education:  11th grade GED.  Children:  7 (one here).    Social Determinants of Health   Financial Resource Strain: Low Risk   . Difficulty of Paying Living Expenses: Not hard at all  Food Insecurity: No Food Insecurity  . Worried About Charity fundraiser in the Last Year: Never true  . Ran Out of Food in the Last Year: Never true  Transportation Needs: Not on file  Physical Activity: Inactive  . Days of Exercise per Week: 0 days  . Minutes of Exercise per Session: 0 min  Stress: No Stress Concern Present  . Feeling of Stress : Not at all  Social Connections: Moderately Isolated  . Frequency of Communication with Friends and Family: More than three times a week  . Frequency of Social Gatherings with Friends and Family: Once a week  . Attends Religious Services: 1 to 4 times per year  . Active Member of Clubs or Organizations: No  . Attends Archivist Meetings: Never  . Marital Status: Divorced  Human resources officer Violence: Not At Risk  . Fear of Current or Ex-Partner: No  . Emotionally Abused: No  . Physically Abused: No  . Sexually Abused: No    FAMILY HISTORY: Family History  Problem Relation Age of Onset  . Diabetes Father   . Lung cancer Sister        dx. <50; former smoker  . Diabetes Brother   . Diabetes Paternal Aunt   . Stroke Maternal Grandmother   . Diabetes Paternal Grandmother   . Emphysema Mother 34       smoker  . Diabetes Brother   . Brain cancer Brother 70       unknown tumor type  . Cancer Daughter 58       neck cancer  . Other Daughter        hysterectomy for unspecified reason  . Breast cancer Cousin   . Cancer  Cousin        unspecified type  . Breast cancer Other        triple negative breast cancer in her 72s  . Cancer Daughter   . Colon polyps Neg Hx   . Esophageal cancer Neg Hx   . Gallbladder disease Neg Hx  ALLERGIES:  is allergic to bacitracin-neomycin-polymyxin  [neomycin-bacitracin zn-polymyx], nsaids, tape, ambien [zolpidem tartrate], amoxicillin, clavulanic acid, contrast media [iodinated diagnostic agents], latex, and prednisone.  MEDICATIONS:  Current Outpatient Medications  Medication Sig Dispense Refill  . Accu-Chek Softclix Lancets lancets Use as instructed to check sugars 1-2 times daily. 100 each 12  . albuterol (VENTOLIN HFA) 108 (90 Base) MCG/ACT inhaler Inhale 2 puffs into the lungs every 6 (six) hours as needed for wheezing or shortness of breath. 8 g 2  . atorvastatin (LIPITOR) 10 MG tablet TAKE 1 TABLET BY MOUTH  DAILY 90 tablet 3  . dexamethasone (DECADRON) 2 MG tablet Take 3 tabs (60m) po daily x3 days and then 2 tabs (453m po daily x3 days, and then 1 tab (33m48mpo daily x3 days 18 tablet 0  . dexamethasone (DECADRON) 4 MG tablet Take 3 tabs with breakfast the day after each treatment. 20 tablet 4  . doxycycline (VIBRA-TABS) 100 MG tablet Take 1 tablet (100 mg total) by mouth 2 (two) times daily. 14 tablet 0  . fluticasone (FLOVENT HFA) 110 MCG/ACT inhaler Inhale 2 puffs into the lungs in the morning and at bedtime. 1 each 12  . glucose blood (ACCU-CHEK GUIDE) test strip Use as instructed to check sugars 1-2 times daily. 100 each 12  . lidocaine-prilocaine (EMLA) cream APPLY 1 APPLICATION TOPICALLY AS NEEDED. 30 g 0  . loratadine (CLARITIN) 10 MG tablet Take 10 mg by mouth daily.    . lMarland Kitchensartan (COZAAR) 100 MG tablet TAKE 1 TABLET BY MOUTH  DAILY 90 tablet 3  . metFORMIN (GLUCOPHAGE) 500 MG tablet TAKE 1 TABLET(500 MG) BY MOUTH TWICE DAILY WITH A MEAL 180 tablet 0  . mometasone (NASONEX) 50 MCG/ACT nasal spray Place 2 sprays into the nose daily. (Patient taking  differently: Place 2 sprays into the nose as needed.) 17 g 12  . ondansetron (ZOFRAN) 8 MG tablet Take 1 tablet (8 mg total) by mouth 2 (two) times daily as needed (Nausea or vomiting). 30 tablet 1  . pantoprazole (PROTONIX) 40 MG tablet TAKE 1 TABLET BY MOUTH  TWICE DAILY 180 tablet 1  . potassium chloride SA (KLOR-CON) 20 MEQ tablet TAKE 1 TABLET(20 MEQ) BY MOUTH TWICE DAILY 60 tablet 0  . pregabalin (LYRICA) 50 MG capsule TAKE 1 CAPSULE(50 MG) BY MOUTH THREE TIMES DAILY 90 capsule 3  . prochlorperazine (COMPAZINE) 10 MG tablet Take 1 tablet (10 mg total) by mouth every 6 (six) hours as needed (Nausea or vomiting). 30 tablet 1  . tiZANidine (ZANAFLEX) 4 MG tablet TAKE 1 TABLET(4 MG) BY MOUTH AT BEDTIME 30 tablet 2  . traZODone (DESYREL) 100 MG tablet TAKE 1 TABLET BY MOUTH AT  BEDTIME 90 tablet 2  . Vitamin D, Ergocalciferol, (DRISDOL) 1.25 MG (50000 UNIT) CAPS capsule TAKE 1 CAPSULE BY MOUTH 1 TIME A WEEK 12 capsule 2   No current facility-administered medications for this visit.   Facility-Administered Medications Ordered in Other Visits  Medication Dose Route Frequency Provider Last Rate Last Admin  . 0.9 %  sodium chloride infusion   Intravenous Once KalBrunetta GeneraD      . 0.9 %  sodium chloride infusion   Intravenous Once KalBrunetta GeneraD      . acetaminophen (TYLENOL) tablet 650 mg  650 mg Oral Once KalBrunetta GeneraD      . daratumumab ([facility] mg in sodium chloride 0.9 % 430 mL chemo infusion  16 mg/kg (Treatment Plan Recorded) Intravenous  Once [name], MD      . dexamethasone (DECADRON) 20 mg in sodium chloride 0.9 % 50 mL IVPB  20 mg Intravenous Once [name], MD      . diphenhydrAMINE (BENADRYL) capsule 50 mg  50 mg Oral Once [name], MD      . famotidine (PEPCID) IVPB 20 mg in NS 100 mL IVPB  20 mg Intravenous Once [name], MD      . sodium chloride flush (NS) 0.9 % injection 10 mL  10 mL  Intracatheter PRN [name], MD        REVIEW OF SYSTEMS:   10 Point review of Systems was done is negative except as noted above.  PHYSICAL EXAMINATION: ECOG PERFORMANCE STATUS: 2 - Symptomatic, <50% confined to bed   Exam was given in a wheelchair.  GENERAL:alert, in no acute distress and comfortable SKIN: no acute rashes, no significant lesions EYES: conjunctiva are pink and non-injected, sclera anicteric OROPHARYNX: MMM, no exudates, no oropharyngeal erythema or ulceration NECK: supple, no JVD LYMPH:  no palpable lymphadenopathy in the cervical, axillary or inguinal regions LUNGS: clear to auscultation b/l with normal respiratory effort HEART: regular rate & rhythm ABDOMEN:  normoactive bowel sounds , non tender, not distended. Extremity: no pedal edema PSYCH: alert & oriented x 3 with fluent speech NEURO: no focal motor/sensory deficits  LABORATORY DATA:  I have reviewed the data as listed  . CBC Latest Ref Rng & Units 11/30/2020 11/16/2020 11/02/2020  WBC 4.0 - 10.5 K/uL 6.2 5.8 4.7  Hemoglobin 12.0 - 15.0 g/dL 8.8(L) 7.8(L) 8.2(L)  Hematocrit 36.0 - 46.0 % 27.1(L) 24.0(L) 24.5(L)  Platelets 150 - 400 K/uL 265 249 221    . CMP Latest Ref Rng & Units 11/30/2020 11/16/2020 11/02/2020  Glucose 70 - 99 mg/dL 135(H) 116(H) 105(H)  BUN 8 - 23 mg/dL 26(H) 15 20  Creatinine 0.44 - 1.00 mg/dL 1.81(H) 1.72(H) 1.52(H)  Sodium 135 - 145 mmol/L 140 145 142  Potassium 3.5 - 5.1 mmol/L 4.2 4.3 4.0  Chloride 98 - 111 mmol/L 109 113(H) 108  CO2 22 - 32 mmol/L 20(L) 21(L) 21(L)  Calcium 8.9 - 10.3 mg/dL 8.8(L) 8.6(L) 9.0  Total Protein 6.5 - 8.1 g/dL 6.2(L) 6.4(L) 6.3(L)  Total Bilirubin 0.3 - 1.2 mg/dL 0.3 <0.2(L) 0.2(L)  Alkaline Phos 38 - 126 U/L 69 82 69  AST 15 - 41 U/L 9(L) 12(L) 11(L)  ALT 0 - 44 U/L '6 9 10     ' 05/16/2020 Upper Endoscopy:   05/16/2020 Colonoscopy:   01/25/2020 K/L light chains:    01/25/2020 MMP:            RADIOGRAPHIC STUDIES: I  have personally reviewed the radiological images as listed and agreed with the findings in the report. No results found. 08/08/2020 Capsule Endoscopy   ASSESSMENT & PLAN:   75 yo with   1) Newly diagnosed IgG Kappa Multiple myeloma with bone lesions, anemia, renal insuff. M spike @ 3.7g/dl on diagnosis. 1p deletion, polymorphic variant, 13q deletion 2) h/o Dm2 3) Diabetic Neuropathy 4) CKD - likely from DM2, but could have an element of myeloma kidney. 5) h/o TIA and AMI 6) Iron deficiency   PLAN: -Discussed pt labwork today, 11/30/2020; dehydration. Other chemistries stable and counts stable. -Advised pt that the myeloma has been knocked back greatly and she should be feeling close to her baseline from the myeloma standpoint due to no active bone lesions. -Advised pt the  dehydration and the Lipitor could be causes for the muscle stiffness she feels and the current dizziness. -Continue [place] Vitamin B12 q2weeks. -The pt has no prohibitive toxicities from continuing maintenance Daratumumab q2weeks at this time. -Recommended pt continue to increase physical activity to aid with lower energy and stiffer muscles. -Advised pt to contact PCP to discuss Lipitor management as this could cause the muscle stiffness she is feeling. -Discussed referral to Cancer PT. The pt does not desire this. -Discussed Evusheld and pt's eligibility. Will send referral. Advised pt to wait around one month after this before getting the second booster shot. -Continue Zometa. -Will repeat iron levels with next visit/labs. -Continue 50000 IU Vitamin D weekly. -Advised pt to take the muscle relaxer at night, as this can potentially cause dizziness as well. -Start Vitamin B-Complex daily. -Will see back in 2 months with labs.   FOLLOW UP: Please schedule next 2 cycles (4 doses of every 2weeks) Daratumumab. Portflush and labs with each treatment MD visit in 2 months Referral for Evusheld   The total time  spent in the appointment was 30 minutes and more than 50% was on counseling and direct patient cares.  All of the patient's questions were answered with apparent satisfaction. The patient knows to call the clinic with any problems, questions or concerns.   [name] MD [place] AAHIVMS [facility] Hematology/Oncology Physician [facility]  (Office):       [phone] (Work cell):  [phone] (Fax):           [phone]  11/30/2020 12:07 PM   I, Reinaldo Raddle, am acting as scribe for Dr. [name], MD.    .I have reviewed the above documentation for accuracy and completeness, and I agree with the above. [name] MD

## 2020-11-30 ENCOUNTER — Other Ambulatory Visit: Payer: Medicare Other

## 2020-11-30 ENCOUNTER — Inpatient Hospital Stay: Payer: Medicare Other | Admitting: Hematology

## 2020-11-30 ENCOUNTER — Other Ambulatory Visit: Payer: Self-pay

## 2020-11-30 ENCOUNTER — Inpatient Hospital Stay: Payer: Medicare Other

## 2020-11-30 ENCOUNTER — Inpatient Hospital Stay: Payer: Medicare Other | Attending: Hematology

## 2020-11-30 VITALS — BP 124/49 | HR 73 | Temp 97.9°F | Resp 18 | Ht 60.0 in | Wt 182.7 lb

## 2020-11-30 VITALS — BP 105/55 | HR 81 | Temp 97.9°F | Resp 16

## 2020-11-30 DIAGNOSIS — Z298 Encounter for other specified prophylactic measures: Secondary | ICD-10-CM | POA: Insufficient documentation

## 2020-11-30 DIAGNOSIS — C9000 Multiple myeloma not having achieved remission: Secondary | ICD-10-CM | POA: Diagnosis not present

## 2020-11-30 DIAGNOSIS — Z87891 Personal history of nicotine dependence: Secondary | ICD-10-CM | POA: Insufficient documentation

## 2020-11-30 DIAGNOSIS — E1122 Type 2 diabetes mellitus with diabetic chronic kidney disease: Secondary | ICD-10-CM | POA: Diagnosis not present

## 2020-11-30 DIAGNOSIS — Z923 Personal history of irradiation: Secondary | ICD-10-CM | POA: Insufficient documentation

## 2020-11-30 DIAGNOSIS — I252 Old myocardial infarction: Secondary | ICD-10-CM | POA: Diagnosis not present

## 2020-11-30 DIAGNOSIS — E1140 Type 2 diabetes mellitus with diabetic neuropathy, unspecified: Secondary | ICD-10-CM | POA: Insufficient documentation

## 2020-11-30 DIAGNOSIS — D509 Iron deficiency anemia, unspecified: Secondary | ICD-10-CM

## 2020-11-30 DIAGNOSIS — Z5112 Encounter for antineoplastic immunotherapy: Secondary | ICD-10-CM

## 2020-11-30 DIAGNOSIS — Z8673 Personal history of transient ischemic attack (TIA), and cerebral infarction without residual deficits: Secondary | ICD-10-CM | POA: Insufficient documentation

## 2020-11-30 DIAGNOSIS — Z853 Personal history of malignant neoplasm of breast: Secondary | ICD-10-CM | POA: Diagnosis not present

## 2020-11-30 DIAGNOSIS — Z95828 Presence of other vascular implants and grafts: Secondary | ICD-10-CM

## 2020-11-30 DIAGNOSIS — N189 Chronic kidney disease, unspecified: Secondary | ICD-10-CM | POA: Diagnosis not present

## 2020-11-30 DIAGNOSIS — Z7189 Other specified counseling: Secondary | ICD-10-CM

## 2020-11-30 LAB — CBC WITH DIFFERENTIAL/PLATELET
Abs Immature Granulocytes: 0.04 10*3/uL (ref 0.00–0.07)
Basophils Absolute: 0 10*3/uL (ref 0.0–0.1)
Basophils Relative: 0 %
Eosinophils Absolute: 0.1 10*3/uL (ref 0.0–0.5)
Eosinophils Relative: 1 %
HCT: 27.1 % — ABNORMAL LOW (ref 36.0–46.0)
Hemoglobin: 8.8 g/dL — ABNORMAL LOW (ref 12.0–15.0)
Immature Granulocytes: 1 %
Lymphocytes Relative: 23 %
Lymphs Abs: 1.4 10*3/uL (ref 0.7–4.0)
MCH: 32.7 pg (ref 26.0–34.0)
MCHC: 32.5 g/dL (ref 30.0–36.0)
MCV: 100.7 fL — ABNORMAL HIGH (ref 80.0–100.0)
Monocytes Absolute: 0.4 10*3/uL (ref 0.1–1.0)
Monocytes Relative: 7 %
Neutro Abs: 4.2 10*3/uL (ref 1.7–7.7)
Neutrophils Relative %: 68 %
Platelets: 265 10*3/uL (ref 150–400)
RBC: 2.69 MIL/uL — ABNORMAL LOW (ref 3.87–5.11)
RDW: 14.3 % (ref 11.5–15.5)
WBC: 6.2 10*3/uL (ref 4.0–10.5)
nRBC: 0 % (ref 0.0–0.2)

## 2020-11-30 LAB — CMP (CANCER CENTER ONLY)
ALT: 6 U/L (ref 0–44)
AST: 9 U/L — ABNORMAL LOW (ref 15–41)
Albumin: 3.3 g/dL — ABNORMAL LOW (ref 3.5–5.0)
Alkaline Phosphatase: 69 U/L (ref 38–126)
Anion gap: 11 (ref 5–15)
BUN: 26 mg/dL — ABNORMAL HIGH (ref 8–23)
CO2: 20 mmol/L — ABNORMAL LOW (ref 22–32)
Calcium: 8.8 mg/dL — ABNORMAL LOW (ref 8.9–10.3)
Chloride: 109 mmol/L (ref 98–111)
Creatinine: 1.81 mg/dL — ABNORMAL HIGH (ref 0.44–1.00)
GFR, Estimated: 29 mL/min — ABNORMAL LOW (ref >60)
Glucose, Bld: 135 mg/dL — ABNORMAL HIGH (ref 70–99)
Potassium: 4.2 mmol/L (ref 3.5–5.1)
Sodium: 140 mmol/L (ref 135–145)
Total Bilirubin: 0.3 mg/dL (ref 0.3–1.2)
Total Protein: 6.2 g/dL — ABNORMAL LOW (ref 6.5–8.1)

## 2020-11-30 MED ORDER — SODIUM CHLORIDE 0.9% FLUSH
10.0000 mL | INTRAVENOUS | Status: DC | PRN
  Administered 2020-11-30: 10 mL
  Filled 2020-11-30: qty 10

## 2020-11-30 MED ORDER — SODIUM CHLORIDE 0.9% FLUSH
10.0000 mL | Freq: Once | INTRAVENOUS | Status: AC
Start: 2020-11-30 — End: 2020-11-30
  Administered 2020-11-30: 10 mL
  Filled 2020-11-30: qty 10

## 2020-11-30 MED ORDER — HEPARIN SOD (PORK) LOCK FLUSH 100 UNIT/ML IV SOLN
500.0000 [IU] | Freq: Once | INTRAVENOUS | Status: AC | PRN
  Administered 2020-11-30: 500 [IU]
  Filled 2020-11-30: qty 5

## 2020-11-30 MED ORDER — FAMOTIDINE 20 MG IN NS 100 ML IVPB
20.0000 mg | Freq: Once | INTRAVENOUS | Status: AC
  Administered 2020-11-30: 20 mg via INTRAVENOUS

## 2020-11-30 MED ORDER — ACETAMINOPHEN 325 MG PO TABS
650.0000 mg | ORAL_TABLET | Freq: Once | ORAL | Status: AC
  Administered 2020-11-30: 650 mg via ORAL

## 2020-11-30 MED ORDER — DIPHENHYDRAMINE HCL 25 MG PO CAPS
50.0000 mg | ORAL_CAPSULE | Freq: Once | ORAL | Status: AC
  Administered 2020-11-30: 50 mg via ORAL

## 2020-11-30 MED ORDER — DIPHENHYDRAMINE HCL 25 MG PO CAPS
ORAL_CAPSULE | ORAL | Status: AC
  Filled 2020-11-30: qty 2

## 2020-11-30 MED ORDER — SODIUM CHLORIDE 0.9 % IV SOLN
Freq: Once | INTRAVENOUS | Status: DC
  Filled 2020-11-30: qty 250

## 2020-11-30 MED ORDER — SODIUM CHLORIDE 0.9 % IV SOLN
16.0000 mg/kg | Freq: Once | INTRAVENOUS | Status: AC
  Administered 2020-11-30: 1400 mg via INTRAVENOUS
  Filled 2020-11-30: qty 60

## 2020-11-30 MED ORDER — SODIUM CHLORIDE 0.9 % IV SOLN
20.0000 mg | Freq: Once | INTRAVENOUS | Status: AC
  Administered 2020-11-30: 20 mg via INTRAVENOUS
  Filled 2020-11-30: qty 20

## 2020-11-30 MED ORDER — SODIUM CHLORIDE 0.9 % IV SOLN
Freq: Once | INTRAVENOUS | Status: AC
  Filled 2020-11-30: qty 250

## 2020-11-30 MED ORDER — DIPHENHYDRAMINE HCL 25 MG PO CAPS
ORAL_CAPSULE | ORAL | Status: AC
  Filled 2020-11-30: qty 1

## 2020-11-30 MED ORDER — ACETAMINOPHEN 325 MG PO TABS
ORAL_TABLET | ORAL | Status: AC
  Filled 2020-11-30: qty 2

## 2020-11-30 MED ORDER — FAMOTIDINE 20 MG IN NS 100 ML IVPB
INTRAVENOUS | Status: AC
  Filled 2020-11-30: qty 100

## 2020-11-30 NOTE — Progress Notes (Unsigned)
Per Dr Irene Limbo: Pt to get B12 today but hold Zometa until next visit

## 2020-11-30 NOTE — Patient Instructions (Signed)
[facility] MEDICAL ONCOLOGY  Discharge Instructions: Thank you for choosing [facility] to provide your oncology and hematology care.   If you have a lab appointment with the Cancer Center, please go directly to the Cancer Center and check in at the registration area.   Wear comfortable clothing and clothing appropriate for easy access to any Portacath or PICC line.   We strive to give you quality time with your provider. You may need to reschedule your appointment if you arrive late (15 or more minutes).  Arriving late affects you and other patients whose appointments are after yours.  Also, if you miss three or more appointments without notifying the office, you may be dismissed from the clinic at the provider's discretion.      For prescription refill requests, have your pharmacy contact our office and allow 72 hours for refills to be completed.    Today you received the following chemotherapy and/or immunotherapy agents darzalex      To help prevent nausea and vomiting after your treatment, we encourage you to take your nausea medication as directed.  BELOW ARE SYMPTOMS THAT SHOULD BE REPORTED IMMEDIATELY: *FEVER GREATER THAN 100.4 F (38 C) OR HIGHER *CHILLS OR SWEATING *NAUSEA AND VOMITING THAT IS NOT CONTROLLED WITH YOUR NAUSEA MEDICATION *UNUSUAL SHORTNESS OF BREATH *UNUSUAL BRUISING OR BLEEDING *URINARY PROBLEMS (pain or burning when urinating, or frequent urination) *BOWEL PROBLEMS (unusual diarrhea, constipation, pain near the anus) TENDERNESS IN MOUTH AND THROAT WITH OR WITHOUT PRESENCE OF ULCERS (sore throat, sores in mouth, or a toothache) UNUSUAL RASH, SWELLING OR PAIN  UNUSUAL VAGINAL DISCHARGE OR ITCHING   Items with * indicate a potential emergency and should be followed up as soon as possible or go to the Emergency Department if any problems should occur.  Please show the CHEMOTHERAPY ALERT CARD or IMMUNOTHERAPY ALERT CARD at check-in to  the Emergency Department and triage nurse.  Should you have questions after your visit or need to cancel or reschedule your appointment, please contact [facility] MEDICAL ONCOLOGY  Dept: [phone]  and follow the prompts.  Office hours are 8:00 a.m. to 4:30 p.m. Monday - Friday. Please note that voicemails left after 4:00 p.m. may not be returned until the following business day.  We are closed weekends and major holidays. You have access to a nurse at all times for urgent questions. Please call the main number to the clinic Dept: [phone] and follow the prompts.   For any non-urgent questions, you may also contact your provider using MyChart. We now offer e-Visits for anyone 18 and older to request care online for non-urgent symptoms. For details visit [email or web address].   Also download the MyChart app! Go to the app store, search "MyChart", open the app, select [facility], and log in with your MyChart username and password.  Due to Covid, a mask is required upon entering the hospital/clinic. If you do not have a mask, one will be given to you upon arrival. For doctor visits, patients may have 1 support person aged 18 or older with them. For treatment visits, patients cannot have anyone with them due to current Covid guidelines and our immunocompromised population.  

## 2020-11-30 NOTE — Progress Notes (Signed)
No zometa or b-12 injection per Dr.kale, to be given next appointment.

## 2020-12-01 ENCOUNTER — Telehealth: Payer: Self-pay | Admitting: Hematology

## 2020-12-01 ENCOUNTER — Telehealth: Payer: Self-pay

## 2020-12-01 NOTE — Telephone Encounter (Signed)
Scheduled follow-up appointments per 5/5 los. Patient is aware. Mailed calendar.

## 2020-12-01 NOTE — Telephone Encounter (Signed)
Pt needs to be evaluated for her leg tightness/pain.  I don't think I have any openings today but we could try and get her in with a different provider.  Or she needs to call her cancer team and let them know in case they want to see her

## 2020-12-01 NOTE — Telephone Encounter (Signed)
Nurse Assessment Nurse: Raenette Rover, RN, Zella Ball Date/Time (Eastern Time): 11/29/2020 3:19:31 PM Confirm and document reason for call. If symptomatic, describe symptoms. ---Pt is having tightness in her legs and feet, also weakness as well as being soft to the touch. Her legs feels very heavy and just recently stopped taking medication. Pregabalin 50 mg tid,Just didnt take todays dose. The tightness has been ongoing. The tightness and heaviness is present but no edema. Rates the pain as 7-8/10 and difficult to walk. Does the patient have any new or worsening symptoms? ---Yes Will a triage be completed? ---Yes Related visit to physician within the last 2 weeks? ---Yes Does the PT have any chronic conditions? (i.e. diabetes, asthma, this includes High risk factors for pregnancy, etc.) ---Yes List chronic conditions. ---cancer Multiple myloma Is this a behavioral health or substance abuse call? ---No PLEASE NOTE: All timestamps contained within this report are represented as [place] Standard Time. CONFIDENTIALTY NOTICE: This fax transmission is intended only for the addressee. It contains information that is legally privileged, confidential or otherwise protected from use or disclosure. If you are not the intended recipient, you are strictly prohibited from reviewing, disclosing, copying using or disseminating any of this information or taking any action in reliance on or regarding this information. If you have received this fax in error, please notify us immediately by telephone so that we can arrange for its return to [place]. Phone: [phone], Toll-Free: [phone], Fax: [phone] Page: 2 of 2 Call Id: [id number] Guidelines Guideline Title Affirmed Question Affirmed Notes Nurse Date/Time Eilene Ghazi Time) Leg Pain Cancer treatment in past six months (or has cancer now) Raenette Rover, RN, Eunice 11/29/2020 3:23:42 PM Disp. Time Eilene Ghazi Time) Disposition Final User 11/29/2020 3:34:07 PM See HCP within  4 Hours (or PCP triage) Yes Raenette Rover, RN, Herbert Deaner Disagree/Comply Disagree Caller Understands Yes PreDisposition Did not know what to do Care Advice Given Per Guideline SEE HCP (OR PCP TRIAGE) WITHIN 4 HOURS: CALL BACK IF: * You become worse CARE ADVICE given per Leg Pain (Adult) guideline. Comments User: Wilson Singer, RN Date/Time Eilene Ghazi Time): 11/29/2020 3:26:36 PM April 21st last cancer treatment. User: Wilson Singer, RN Date/Time Eilene Ghazi Time): 11/29/2020 3:33:59 PM Unalbe to transfer the caller. the back line rings several times and then goes to a busy signal. Caller wants the office to call her back. She doesn't plan to go to an urgent care. Advised caller it is not recommended to stop any medications without discussing it with her dr first. There could be some side effects from abruptly stopping them after long term use. Referrals GO TO FACILITY REFUSED

## 2020-12-02 ENCOUNTER — Other Ambulatory Visit: Payer: Self-pay | Admitting: Hematology

## 2020-12-04 ENCOUNTER — Other Ambulatory Visit: Payer: Self-pay | Admitting: Hematology

## 2020-12-04 LAB — MULTIPLE MYELOMA PANEL, SERUM
Albumin SerPl Elph-Mcnc: 3.3 g/dL (ref 2.9–4.4)
Albumin/Glob SerPl: 1.3 (ref 0.7–1.7)
Alpha 1: 0.2 g/dL (ref 0.0–0.4)
Alpha2 Glob SerPl Elph-Mcnc: 1.2 g/dL — ABNORMAL HIGH (ref 0.4–1.0)
B-Globulin SerPl Elph-Mcnc: 0.9 g/dL (ref 0.7–1.3)
Gamma Glob SerPl Elph-Mcnc: 0.3 g/dL — ABNORMAL LOW (ref 0.4–1.8)
Globulin, Total: 2.6 g/dL (ref 2.2–3.9)
IgA: 50 mg/dL — ABNORMAL LOW (ref 64–422)
IgG (Immunoglobin G), Serum: 459 mg/dL — ABNORMAL LOW (ref 586–1602)
IgM (Immunoglobulin M), Srm: 36 mg/dL (ref 26–217)
M Protein SerPl Elph-Mcnc: 0.2 g/dL — ABNORMAL HIGH
Total Protein ELP: 5.9 g/dL — ABNORMAL LOW (ref 6.0–8.5)

## 2020-12-05 ENCOUNTER — Telehealth: Payer: Self-pay

## 2020-12-05 NOTE — Chronic Care Management (AMB) (Signed)
Chronic Care Management Pharmacy Assistant   Name: Tracy [name]  MRN: [id number] DOB: [birth year]  Reason for Encounter: Hypertension Disease State Call  Recent office visits:  11/20/20- [name], MD (Video Visit)- seen for acute allergic sinusitis short course dexamethasone 2 mg x 10 DS, no follow up noted  TE 11/22/20 patient call, rx for doxycycline given in case of bacterial infection 10/03/20- [name], MD( TE)- started lyrica 50 mg TID, stopped gabapentin 02/17/22[name], MD- seen for memory loss, referral to neuro rehab for balance and gait, referral to neuropsychology, follow up as scheduled 08/29/20- [name], MD- seen for polyneuropathy, increased Gabapentin to 600mg  (2 pills) at night and  continue  Gabapentin 300mg  (1 pill) morning and midday, follow up as scheduled  Recent consult visits:  10/30/20- [name], MD (Oncology)- Orders only: referral to gastro for anemia caused by  GI blood loss 08/31/20-  [name], MD (Oncology)- seen for oncology treatment, started [place] vitb12 q2wks initially and q55month following, recommended discontinued asa (pcp confirmed discontinuation in messages), return in 3 weeks 08/29/20- Owens Loffler, MD Gertie Fey)- seen for anemia, no medication changes, no follow up noted  Hospital visits:  None in previous 6 months  Medications: Outpatient Encounter Medications as of 12/05/2020  Medication Sig Note  . Accu-Chek Softclix Lancets lancets Use as instructed to check sugars 1-2 times daily.   Marland Kitchen albuterol (VENTOLIN HFA) 108 (90 Base) MCG/ACT inhaler Inhale 2 puffs into the lungs every 6 (six) hours as needed for wheezing or shortness of breath.   Marland Kitchen atorvastatin (LIPITOR) 10 MG tablet TAKE 1 TABLET BY MOUTH  DAILY   . dexamethasone (DECADRON) 2 MG tablet Take 3 tabs (6mg ) po daily x3 days and then 2 tabs (4mg ) po daily x3 days, and then 1 tab (2mg ) po daily x3 days   . dexamethasone (DECADRON)  4 MG tablet Take 3 tabs with breakfast the day after each treatment.   Marland Kitchen doxycycline (VIBRA-TABS) 100 MG tablet Take 1 tablet (100 mg total) by mouth 2 (two) times daily.   . fluticasone (FLOVENT HFA) 110 MCG/ACT inhaler Inhale 2 puffs into the lungs in the morning and at bedtime.   Marland Kitchen glucose blood (ACCU-CHEK GUIDE) test strip Use as instructed to check sugars 1-2 times daily.   Marland Kitchen lidocaine-prilocaine (EMLA) cream APPLY 1 APPLICATION TOPICALLY AS NEEDED.   Marland Kitchen loratadine (CLARITIN) 10 MG tablet Take 10 mg by mouth daily.   Marland Kitchen losartan (COZAAR) 100 MG tablet TAKE 1 TABLET BY MOUTH  DAILY   . metFORMIN (GLUCOPHAGE) 500 MG tablet TAKE 1 TABLET(500 MG) BY MOUTH TWICE DAILY WITH A MEAL   . mometasone (NASONEX) 50 MCG/ACT nasal spray Place 2 sprays into the nose daily. (Patient taking differently: Place 2 sprays into the nose as needed.) 08/29/2020: Changed to as needed  . ondansetron (ZOFRAN) 8 MG tablet Take 1 tablet (8 mg total) by mouth 2 (two) times daily as needed (Nausea or vomiting).   . pantoprazole (PROTONIX) 40 MG tablet TAKE 1 TABLET BY MOUTH  TWICE DAILY   . potassium chloride SA (KLOR-CON) 20 MEQ tablet TAKE 1 TABLET(20 MEQ) BY MOUTH TWICE DAILY   . pregabalin (LYRICA) 50 MG capsule TAKE 1 CAPSULE(50 MG) BY MOUTH THREE TIMES DAILY   . prochlorperazine (COMPAZINE) 10 MG tablet Take 1 tablet (10 mg total) by mouth every 6 (six) hours as needed (Nausea or vomiting).   Marland Kitchen tiZANidine (ZANAFLEX) 4 MG tablet TAKE 1 TABLET(4 MG) BY  MOUTH AT BEDTIME   . traZODone (DESYREL) 100 MG tablet TAKE 1 TABLET BY MOUTH AT  BEDTIME   . Vitamin D, Ergocalciferol, (DRISDOL) 1.25 MG (50000 UNIT) CAPS capsule TAKE 1 CAPSULE BY MOUTH 1 TIME A WEEK    No facility-administered encounter medications on file as of 12/05/2020.   Reviewed chart prior to disease state call. Spoke with patient regarding BP  Recent Office Vitals: BP Readings from Last 3 Encounters:  11/30/20 (!) 105/55  11/30/20 (!) 124/49  11/20/20 130/70    Pulse Readings from Last 3 Encounters:  11/30/20 81  11/30/20 73  11/16/20 90    Wt Readings from Last 3 Encounters:  11/30/20 182 lb 11.2 oz (82.9 kg)  11/16/20 184 lb (83.5 kg)  10/19/20 182 lb 4.8 oz (82.7 kg)     Kidney Function Lab Results  Component Value Date/Time   CREATININE 1.81 (H) 11/30/2020 10:26 AM   CREATININE 1.72 (H) 11/16/2020 11:06 AM   CREATININE 0.9 07/13/2015 03:16 PM   CREATININE 0.95 05/10/2014 11:47 AM   GFR 53.39 (L) 01/19/2020 02:18 PM   GFRNONAA 29 (L) 11/30/2020 10:26 AM   GFRNONAA 61 05/10/2014 11:47 AM   GFRAA 41 (L) 04/21/2020 10:24 AM   GFRAA 70 05/10/2014 11:47 AM    BMP Latest Ref Rng & Units 11/30/2020 11/16/2020 11/02/2020  Glucose 70 - 99 mg/dL 135(H) 116(H) 105(H)  BUN 8 - 23 mg/dL 26(H) 15 20  Creatinine 0.44 - 1.00 mg/dL 1.81(H) 1.72(H) 1.52(H)  Sodium 135 - 145 mmol/L 140 145 142  Potassium 3.5 - 5.1 mmol/L 4.2 4.3 4.0  Chloride 98 - 111 mmol/L 109 113(H) 108  CO2 22 - 32 mmol/L 20(L) 21(L) 21(L)  Calcium 8.9 - 10.3 mg/dL 8.8(L) 8.6(L) 9.0    I spoke with Tracy [name] this morning and she is doing well overall. Since switching to Lyrica she has experienced major relief with her leg pains. She stated that she still has pains and cold feet, but changing the medication has mad a big difference. She denies any side effects and is content with the change. In order to help her in addition to the medication, she has begun to drink water at the recommendation of Dr. Irene Limbo. She stated that even though she does not like water she still does because it seems to help. She is on maintenance oncology treatment once a month and has no complaints about it, however she is concerned about a testing procedure she is supposed to have. She stated Dr. Ardis Hughs' office has yet to call her to schedule an appointment that Dr. Irene Limbo requested to dx the cause of her anemia. Tracy [name] requested an appt with [name] to discuss further care. [name] phone call scheduled for 05/11  at 1 pm.  Current antihypertensive regimen:  Losartan 100 mg daily  How often are you checking your Blood Pressure?  Patient stated she checks her BP infrequently and only when she feels like it   Current home BP readings: 135/70  What recent interventions/DTPs have been made by any provider to improve Blood Pressure control since last [name] Visit: No recent interventions  Any recent hospitalizations or ED visits since last visit with [name]? No  What diet changes have been made to improve Blood Pressure Control?  Patient stated she does not adhere to any specific diet, she just eats what she wants  What exercise is being done to improve your Blood Pressure Control?  Patient stated she does not get much activity. She  does not like being outdoors and chooses to stay in the house majority of the time.  Adherence Review: Is the patient currently on ACE/ARB medication? Yes Does the patient have >5 day gap between last estimated fill dates? No   Star Rating Drugs: Losartan 100 mg- 90 DS last filled 09/27/20 Atorvastatin 10 mg- 90 DS last filled 09/27/20  Wilford Sports CPA, CMA

## 2020-12-10 ENCOUNTER — Other Ambulatory Visit: Payer: Self-pay | Admitting: Family Medicine

## 2020-12-11 ENCOUNTER — Telehealth: Payer: Self-pay | Admitting: Adult Health

## 2020-12-11 NOTE — Telephone Encounter (Signed)
I called patient to discuss Evusheld, a long acting monoclonal antibody injection administered to patients with decreased immune systems or intolerance/allergy to the COVID 19 vaccine as COVID19 prevention.    Unable to reach patient.  LMOM to return my call.  Keiarah Orlowski, NP  

## 2020-12-12 ENCOUNTER — Telehealth: Payer: Self-pay | Admitting: Adult Health

## 2020-12-12 NOTE — Telephone Encounter (Signed)
Called and spoke to patient about Evusheld.  As I reviewed this with her she noted that she has had increased left arm swelling.  She plans on following up with her care team about this on Thursday, 12/14/2020.  Once she gets this squared away with her care team, she will call me back to discuss Evusheld further.  Wilber Bihari, NP

## 2020-12-13 ENCOUNTER — Ambulatory Visit: Payer: Medicare Other

## 2020-12-13 ENCOUNTER — Other Ambulatory Visit: Payer: Self-pay

## 2020-12-13 DIAGNOSIS — E1140 Type 2 diabetes mellitus with diabetic neuropathy, unspecified: Secondary | ICD-10-CM

## 2020-12-13 MED ORDER — METFORMIN HCL 500 MG PO TABS
ORAL_TABLET | ORAL | 0 refills | Status: DC
Start: 2020-12-13 — End: 2020-12-22

## 2020-12-13 MED ORDER — METFORMIN HCL 500 MG PO TABS: ORAL_TABLET | ORAL | 0 refills | Status: DC

## 2020-12-13 NOTE — Progress Notes (Deleted)
Error

## 2020-12-14 ENCOUNTER — Other Ambulatory Visit: Payer: Medicare Other

## 2020-12-14 ENCOUNTER — Other Ambulatory Visit: Payer: Self-pay

## 2020-12-14 ENCOUNTER — Other Ambulatory Visit: Payer: Self-pay | Admitting: Adult Health

## 2020-12-14 ENCOUNTER — Inpatient Hospital Stay: Payer: Medicare Other

## 2020-12-14 VITALS — BP 134/65 | HR 82 | Temp 98.1°F | Resp 16

## 2020-12-14 DIAGNOSIS — Z87891 Personal history of nicotine dependence: Secondary | ICD-10-CM | POA: Diagnosis not present

## 2020-12-14 DIAGNOSIS — C9000 Multiple myeloma not having achieved remission: Secondary | ICD-10-CM

## 2020-12-14 DIAGNOSIS — N189 Chronic kidney disease, unspecified: Secondary | ICD-10-CM | POA: Diagnosis not present

## 2020-12-14 DIAGNOSIS — E1122 Type 2 diabetes mellitus with diabetic chronic kidney disease: Secondary | ICD-10-CM | POA: Diagnosis not present

## 2020-12-14 DIAGNOSIS — Z5112 Encounter for antineoplastic immunotherapy: Secondary | ICD-10-CM

## 2020-12-14 DIAGNOSIS — Z7189 Other specified counseling: Secondary | ICD-10-CM

## 2020-12-14 DIAGNOSIS — Z923 Personal history of irradiation: Secondary | ICD-10-CM | POA: Diagnosis not present

## 2020-12-14 DIAGNOSIS — Z95828 Presence of other vascular implants and grafts: Secondary | ICD-10-CM

## 2020-12-14 DIAGNOSIS — D509 Iron deficiency anemia, unspecified: Secondary | ICD-10-CM

## 2020-12-14 DIAGNOSIS — Z298 Encounter for other specified prophylactic measures: Secondary | ICD-10-CM | POA: Diagnosis not present

## 2020-12-14 DIAGNOSIS — Z853 Personal history of malignant neoplasm of breast: Secondary | ICD-10-CM | POA: Diagnosis not present

## 2020-12-14 DIAGNOSIS — I252 Old myocardial infarction: Secondary | ICD-10-CM | POA: Diagnosis not present

## 2020-12-14 DIAGNOSIS — Z8673 Personal history of transient ischemic attack (TIA), and cerebral infarction without residual deficits: Secondary | ICD-10-CM | POA: Diagnosis not present

## 2020-12-14 DIAGNOSIS — E1140 Type 2 diabetes mellitus with diabetic neuropathy, unspecified: Secondary | ICD-10-CM | POA: Diagnosis not present

## 2020-12-14 LAB — CBC WITH DIFFERENTIAL/PLATELET
Abs Immature Granulocytes: 0.01 10*3/uL (ref 0.00–0.07)
Basophils Absolute: 0 10*3/uL (ref 0.0–0.1)
Basophils Relative: 1 %
Eosinophils Absolute: 0 10*3/uL (ref 0.0–0.5)
Eosinophils Relative: 1 %
HCT: 25.3 % — ABNORMAL LOW (ref 36.0–46.0)
Hemoglobin: 8.3 g/dL — ABNORMAL LOW (ref 12.0–15.0)
Immature Granulocytes: 0 %
Lymphocytes Relative: 27 %
Lymphs Abs: 1.1 10*3/uL (ref 0.7–4.0)
MCH: 32.9 pg (ref 26.0–34.0)
MCHC: 32.8 g/dL (ref 30.0–36.0)
MCV: 100.4 fL — ABNORMAL HIGH (ref 80.0–100.0)
Monocytes Absolute: 0.2 10*3/uL (ref 0.1–1.0)
Monocytes Relative: 6 %
Neutro Abs: 2.7 10*3/uL (ref 1.7–7.7)
Neutrophils Relative %: 65 %
Platelets: 198 10*3/uL (ref 150–400)
RBC: 2.52 MIL/uL — ABNORMAL LOW (ref 3.87–5.11)
RDW: 13.5 % (ref 11.5–15.5)
WBC: 4 10*3/uL (ref 4.0–10.5)
nRBC: 0 % (ref 0.0–0.2)

## 2020-12-14 LAB — CMP (CANCER CENTER ONLY)
ALT: 11 U/L (ref 0–44)
AST: 13 U/L — ABNORMAL LOW (ref 15–41)
Albumin: 3.3 g/dL — ABNORMAL LOW (ref 3.5–5.0)
Alkaline Phosphatase: 64 U/L (ref 38–126)
Anion gap: 11 (ref 5–15)
BUN: 19 mg/dL (ref 8–23)
CO2: 22 mmol/L (ref 22–32)
Calcium: 9.1 mg/dL (ref 8.9–10.3)
Chloride: 109 mmol/L (ref 98–111)
Creatinine: 1.58 mg/dL — ABNORMAL HIGH (ref 0.44–1.00)
GFR, Estimated: 34 mL/min — ABNORMAL LOW (ref >60)
Glucose, Bld: 136 mg/dL — ABNORMAL HIGH (ref 70–99)
Potassium: 4.4 mmol/L (ref 3.5–5.1)
Sodium: 142 mmol/L (ref 135–145)
Total Bilirubin: 0.3 mg/dL (ref 0.3–1.2)
Total Protein: 6.2 g/dL — ABNORMAL LOW (ref 6.5–8.1)

## 2020-12-14 MED ORDER — CYANOCOBALAMIN 1000 MCG/ML IJ SOLN
INTRAMUSCULAR | Status: AC
  Filled 2020-12-14: qty 1

## 2020-12-14 MED ORDER — FAMOTIDINE 20 MG IN NS 100 ML IVPB
20.0000 mg | Freq: Once | INTRAVENOUS | Status: AC
  Administered 2020-12-14: 20 mg via INTRAVENOUS

## 2020-12-14 MED ORDER — SODIUM CHLORIDE 0.9 % IV SOLN
Freq: Once | INTRAVENOUS | Status: AC
  Filled 2020-12-14: qty 250

## 2020-12-14 MED ORDER — SODIUM CHLORIDE 0.9% FLUSH
10.0000 mL | Freq: Once | INTRAVENOUS | Status: AC
  Administered 2020-12-14: 10 mL
  Filled 2020-12-14: qty 10

## 2020-12-14 MED ORDER — CYANOCOBALAMIN 1000 MCG/ML IJ SOLN
1000.0000 ug | Freq: Once | INTRAMUSCULAR | Status: AC
Start: 2020-12-14 — End: 2020-12-14
  Administered 2020-12-14: 1000 ug via SUBCUTANEOUS

## 2020-12-14 MED ORDER — SODIUM CHLORIDE 0.9 % IV SOLN
16.0000 mg/kg | Freq: Once | INTRAVENOUS | Status: AC
  Administered 2020-12-14: 1400 mg via INTRAVENOUS
  Filled 2020-12-14: qty 60

## 2020-12-14 MED ORDER — DIPHENHYDRAMINE HCL 25 MG PO CAPS
ORAL_CAPSULE | ORAL | Status: AC
  Filled 2020-12-14: qty 2

## 2020-12-14 MED ORDER — FAMOTIDINE 20 MG IN NS 100 ML IVPB
INTRAVENOUS | Status: AC
  Filled 2020-12-14: qty 100

## 2020-12-14 MED ORDER — TIXAGEVIMAB (PART OF EVUSHELD) INJECTION
300.0000 mg | Freq: Once | INTRAMUSCULAR | Status: AC
  Administered 2020-12-14: 300 mg via INTRAMUSCULAR
  Filled 2020-12-14: qty 3

## 2020-12-14 MED ORDER — SODIUM CHLORIDE 0.9 % IV SOLN
20.0000 mg | Freq: Once | INTRAVENOUS | Status: AC
  Administered 2020-12-14: 20 mg via INTRAVENOUS
  Filled 2020-12-14: qty 20

## 2020-12-14 MED ORDER — ZOLEDRONIC ACID 4 MG/100ML IV SOLN
4.0000 mg | Freq: Once | INTRAVENOUS | Status: AC
Start: 2020-12-14 — End: 2020-12-14
  Administered 2020-12-14: 4 mg via INTRAVENOUS

## 2020-12-14 MED ORDER — ZOLEDRONIC ACID 4 MG/100ML IV SOLN
INTRAVENOUS | Status: AC
  Filled 2020-12-14: qty 100

## 2020-12-14 MED ORDER — DIPHENHYDRAMINE HCL 25 MG PO CAPS
50.0000 mg | ORAL_CAPSULE | Freq: Once | ORAL | Status: AC
  Administered 2020-12-14: 50 mg via ORAL

## 2020-12-14 MED ORDER — ACETAMINOPHEN 325 MG PO TABS
650.0000 mg | ORAL_TABLET | Freq: Once | ORAL | Status: AC
  Administered 2020-12-14: 650 mg via ORAL

## 2020-12-14 MED ORDER — ACETAMINOPHEN 325 MG PO TABS
ORAL_TABLET | ORAL | Status: AC
  Filled 2020-12-14: qty 2

## 2020-12-14 MED ORDER — HEPARIN SOD (PORK) LOCK FLUSH 100 UNIT/ML IV SOLN
500.0000 [IU] | Freq: Once | INTRAVENOUS | Status: AC | PRN
  Administered 2020-12-14: 500 [IU]
  Filled 2020-12-14: qty 5

## 2020-12-14 MED ORDER — CILGAVIMAB (PART OF EVUSHELD) INJECTION
300.0000 mg | Freq: Once | INTRAMUSCULAR | Status: AC
  Administered 2020-12-14: 300 mg via INTRAMUSCULAR
  Filled 2020-12-14: qty 3

## 2020-12-14 NOTE — Patient Instructions (Signed)
[facility]    Discharge Instructions: Thank you for choosing [facility] to provide your oncology and hematology care.   If you have a lab appointment with the [facility], please go directly to the [facility] and check in at the registration area.   Wear comfortable clothing and clothing appropriate for easy access to any Portacath or PICC line.   We strive to give you quality time with your provider. You may need to reschedule your appointment if you arrive late (15 or more minutes).  Arriving late affects you and other patients whose appointments are after yours.  Also, if you miss three or more appointments without notifying the office, you may be dismissed from the clinic at the provider's discretion.      For prescription refill requests, have your pharmacy contact our office and allow 72 hours for refills to be completed.    Today you received the following chemotherapy and/or immunotherapy agents: daratumumab.      To help prevent nausea and vomiting after your treatment, we encourage you to take your nausea medication as directed.  BELOW ARE SYMPTOMS THAT SHOULD BE REPORTED IMMEDIATELY: . *FEVER GREATER THAN 100.4 F (38 C) OR HIGHER . *CHILLS OR SWEATING . *NAUSEA AND VOMITING THAT IS NOT CONTROLLED WITH YOUR NAUSEA MEDICATION . *UNUSUAL SHORTNESS OF BREATH . *UNUSUAL BRUISING OR BLEEDING . *URINARY PROBLEMS (pain or burning when urinating, or frequent urination) . *BOWEL PROBLEMS (unusual diarrhea, constipation, pain near the anus) . TENDERNESS IN MOUTH AND THROAT WITH OR WITHOUT PRESENCE OF ULCERS (sore throat, sores in mouth, or a toothache) . UNUSUAL RASH, SWELLING OR PAIN  . UNUSUAL VAGINAL DISCHARGE OR ITCHING   Items with * indicate a potential emergency and should be followed up as soon as possible or go to the Emergency Department if any problems should occur.  Please show the CHEMOTHERAPY ALERT CARD or IMMUNOTHERAPY  ALERT CARD at check-in to the Emergency Department and triage nurse.  Should you have questions after your visit or need to cancel or reschedule your appointment, please contact [facility]  Dept: [phone]  and follow the prompts.  Office hours are 8:00 a.m. to 4:30 p.m. Monday - Friday. Please note that voicemails left after 4:00 p.m. may not be returned until the following business day.  We are closed weekends and major holidays. You have access to a nurse at all times for urgent questions. Please call the main number to the clinic Dept: [phone] and follow the prompts.   For any non-urgent questions, you may also contact your provider using MyChart. We now offer e-Visits for anyone 31 and older to request care online for non-urgent symptoms. For details visit [email or web address].   Also download the MyChart app! Go to the app store, search "MyChart", open the app, select [facility], and log in with your MyChart username and password.  Due to Covid, a mask is required upon entering the hospital/clinic. If you do not have a mask, one will be given to you upon arrival. For doctor visits, patients may have 1 support person aged 52 or older with them. For treatment visits, patients cannot have anyone with them due to current Covid guidelines and our immunocompromised population.   Zoledronic Acid Injection (Hypercalcemia, Oncology) What is this medicine? ZOLEDRONIC ACID (ZOE le dron ik AS id) slows calcium loss from bones. It high calcium levels in the blood from some kinds of cancer. It may be  used in other people at risk for bone loss. This medicine may be used for other purposes; ask your health care provider or pharmacist if you have questions. COMMON BRAND NAME(S): Zometa What should I tell my health care provider before I take this medicine? They need to know if you have any of these conditions:  cancer  dehydration  dental disease  kidney  disease  liver disease  low levels of calcium in the blood  lung or breathing disease (asthma)  receiving steroids like dexamethasone or prednisone  an unusual or allergic reaction to zoledronic acid, other medicines, foods, dyes, or preservatives  pregnant or trying to get pregnant  breast-feeding How should I use this medicine? This drug is injected into a vein. It is given by a health care provider in a hospital or clinic setting. Talk to your health care provider about the use of this drug in children. Special care may be needed. Overdosage: If you think you have taken too much of this medicine contact a poison control center or emergency room at once. NOTE: This medicine is only for you. Do not share this medicine with others. What if I miss a dose? Keep appointments for follow-up doses. It is important not to miss your dose. Call your health care provider if you are unable to keep an appointment. What may interact with this medicine?  certain antibiotics given by injection  NSAIDs, medicines for pain and inflammation, like ibuprofen or naproxen  some diuretics like bumetanide, furosemide  teriparatide  thalidomide This list may not describe all possible interactions. Give your health care provider a list of all the medicines, herbs, non-prescription drugs, or dietary supplements you use. Also tell them if you smoke, drink alcohol, or use illegal drugs. Some items may interact with your medicine. What should I watch for while using this medicine? Visit your health care provider for regular checks on your progress. It may be some time before you see the benefit from this drug. Some people who take this drug have severe bone, joint, or muscle pain. This drug may also increase your risk for jaw problems or a broken thigh bone. Tell your health care provider right away if you have severe pain in your jaw, bones, joints, or muscles. Tell you health care provider if you have any  pain that does not go away or that gets worse. Tell your dentist and dental surgeon that you are taking this drug. You should not have major dental surgery while on this drug. See your dentist to have a dental exam and fix any dental problems before starting this drug. Take good care of your teeth while on this drug. Make sure you see your dentist for regular follow-up appointments. You should make sure you get enough calcium and vitamin D while you are taking this drug. Discuss the foods you eat and the vitamins you take with your health care provider. Check with your health care provider if you have severe diarrhea, nausea, and vomiting, or if you sweat a lot. The loss of too much body fluid may make it dangerous for you to take this drug. You may need blood work done while you are taking this drug. Do not become pregnant while taking this drug. Women should inform their health care provider if they wish to become pregnant or think they might be pregnant. There is potential for serious harm to an unborn child. Talk to your health care provider for more information. What side effects may I  notice from receiving this medicine? Side effects that you should report to your doctor or health care provider as soon as possible:  allergic reactions (skin rash, itching or hives; swelling of the face, lips, or tongue)  bone pain  infection (fever, chills, cough, sore throat, pain or trouble passing urine)  jaw pain, especially after dental work  joint pain  kidney injury (trouble passing urine or change in the amount of urine)  low blood pressure (dizziness; feeling faint or lightheaded, falls; unusually weak or tired)  low calcium levels (fast heartbeat; muscle cramps or pain; pain, tingling, or numbness in the hands or feet; seizures)  low magnesium levels (fast, irregular heartbeat; muscle cramp or pain; muscle weakness; tremors; seizures)  low red blood cell counts (trouble breathing; feeling  faint; lightheaded, falls; unusually weak or tired)  muscle pain  redness, blistering, peeling, or loosening of the skin, including inside the mouth  severe diarrhea  swelling of the ankles, feet, hands  trouble breathing Side effects that usually do not require medical attention (report to your doctor or health care provider if they continue or are bothersome):  anxious  constipation  coughing  depressed mood  eye irritation, itching, or pain  fever  general ill feeling or flu-like symptoms  nausea  pain, redness, or irritation at site where injected  trouble sleeping This list may not describe all possible side effects. Call your doctor for medical advice about side effects. You may report side effects to FDA at 1-800-FDA-1088. Where should I keep my medicine? This drug is given in a hospital or clinic. It will not be stored at home. NOTE: This sheet is a summary. It may not cover all possible information. If you have questions about this medicine, talk to your doctor, pharmacist, or health care provider.  2021 Elsevier/Gold Standard (2019-04-29 09:13:00)  Cyanocobalamin, Vitamin B12 injection What is this medicine? CYANOCOBALAMIN (sye an oh koe BAL a min) is a man made form of vitamin B12. Vitamin B12 is used in the growth of healthy blood cells, nerve cells, and proteins in the body. It also helps with the metabolism of fats and carbohydrates. This medicine is used to treat people who can not absorb vitamin B12. This medicine may be used for other purposes; ask your health care provider or pharmacist if you have questions. COMMON BRAND NAME(S): B-12 Compliance Kit, B-12 Injection Kit, Cyomin, LA-12, Nutri-Twelve, Physicians EZ Use B-12, Primabalt What should I tell my health care provider before I take this medicine? They need to know if you have any of these conditions:  kidney disease  Leber's disease  megaloblastic anemia  an unusual or allergic reaction to  cyanocobalamin, cobalt, other medicines, foods, dyes, or preservatives  pregnant or trying to get pregnant  breast-feeding How should I use this medicine? This medicine is injected into a muscle or deeply under the skin. It is usually given by a health care professional in a clinic or doctor's office. However, your doctor may teach you how to inject yourself. Follow all instructions. Talk to your pediatrician regarding the use of this medicine in children. Special care may be needed. Overdosage: If you think you have taken too much of this medicine contact a poison control center or emergency room at once. NOTE: This medicine is only for you. Do not share this medicine with others. What if I miss a dose? If you are given your dose at a clinic or doctor's office, call to reschedule your appointment. If you give  your own injections and you miss a dose, take it as soon as you can. If it is almost time for your next dose, take only that dose. Do not take double or extra doses. What may interact with this medicine?  colchicine  heavy alcohol intake This list may not describe all possible interactions. Give your health care provider a list of all the medicines, herbs, non-prescription drugs, or dietary supplements you use. Also tell them if you smoke, drink alcohol, or use illegal drugs. Some items may interact with your medicine. What should I watch for while using this medicine? Visit your doctor or health care professional regularly. You may need blood work done while you are taking this medicine. You may need to follow a special diet. Talk to your doctor. Limit your alcohol intake and avoid smoking to get the best benefit. What side effects may I notice from receiving this medicine? Side effects that you should report to your doctor or health care professional as soon as possible:  allergic reactions like skin rash, itching or hives, swelling of the face, lips, or tongue  blue tint to  skin  chest tightness, pain  difficulty breathing, wheezing  dizziness  red, swollen painful area on the leg Side effects that usually do not require medical attention (report to your doctor or health care professional if they continue or are bothersome):  diarrhea  headache This list may not describe all possible side effects. Call your doctor for medical advice about side effects. You may report side effects to FDA at 1-800-FDA-1088. Where should I keep my medicine? Keep out of the reach of children. Store at room temperature between 15 and 30 degrees C (59 and 85 degrees F). Protect from light. Throw away any unused medicine after the expiration date. NOTE: This sheet is a summary. It may not cover all possible information. If you have questions about this medicine, talk to your doctor, pharmacist, or health care provider.  2021 Elsevier/Gold Standard (2007-10-26 22:10:20)

## 2020-12-14 NOTE — Progress Notes (Signed)
Patient reports intermittent and transient left-sided, "sharp and tingling" chest pain that radiates to left arm. States she experienced this issue for 2-3 days, but has not had this discomfort for "several days." Denies precipitating factors or other associated symptoms (dyspnea, N/V, and/or diaphoresis). Dr. Irene Limbo aware of patient's complaint and advised to have patient follow-up with her PCP for work-up if indicated and to go to nearest ED if symptoms return and/or worsen. Patient aware and verbalizes understanding.

## 2020-12-14 NOTE — Progress Notes (Signed)
I connected by phone with Tracy [name] on 12/14/2020, 1:24 PM to discuss the potential use of a new treatment, tixagevimab/cilgavimab, for pre-exposure prophylaxis for prevention of coronavirus disease 2019 (COVID-19) caused by the SARS-CoV-2 virus.  This patient is a 75 y.o. female that meets the FDA criteria for Emergency Use Authorization of tixagevimab/cilgavimab for pre-exposure prophylaxis of COVID-19 disease. Pt meets following criteria:  Age >12 yr and weight > 40kg  Not currently infected with SARS-CoV-2 and has no known recent exposure to an individual infected with SARS-CoV-2 AND o Who has moderate to severe immune compromise due to a medical condition or receipt of immunosuppressive medications or treatments and may not mount an adequate immune response to COVID-19 vaccination or  o Vaccination with any available COVID-19 vaccine, according to the approved or authorized schedule, is not recommended due to a history of severe adverse reaction (e.g., severe allergic reaction) to a COVID-19 vaccine(s) and/or COVID-19 vaccine component(s).  o Patient meets the following definition of mod-severe immune compromised status: 6. Other actively treated hematologic malignancies or severe congenital immunodeficiency syndromes  I have spoken and communicated the following to the patient or parent/caregiver regarding COVID monoclonal antibody treatment:  1. FDA has authorized the emergency use of tixagevimab/cilgavimab for the pre-exposure prophylaxis of COVID-19 in patients with moderate-severe immunocompromised status, who meet above EUA criteria.  2. The significant known and potential risks and benefits of COVID monoclonal antibody, and the extent to which such potential risks and benefits are unknown.  3. Information on available alternative treatments and the risks and benefits of those alternatives, including clinical trials.  4. The patient or parent/caregiver has the option to accept or  refuse COVID monoclonal antibody treatment.  After reviewing this information with the patient, agree to receive tixagevimab/cilgavimab  Scot Dock, NP, 12/14/2020, 1:24 PM

## 2020-12-21 ENCOUNTER — Telehealth: Payer: Self-pay | Admitting: Family Medicine

## 2020-12-21 NOTE — Telephone Encounter (Signed)
Patient would like the following medications added to her [facility] orders - Pregabalin, aclovir, tizandine, and metformin

## 2020-12-22 ENCOUNTER — Other Ambulatory Visit: Payer: Self-pay

## 2020-12-22 DIAGNOSIS — E1140 Type 2 diabetes mellitus with diabetic neuropathy, unspecified: Secondary | ICD-10-CM

## 2020-12-22 MED ORDER — TIZANIDINE HCL 4 MG PO TABS: ORAL_TABLET | ORAL | 1 refills | Status: DC

## 2020-12-22 MED ORDER — METFORMIN HCL 500 MG PO TABS: ORAL_TABLET | ORAL | 1 refills | Status: DC

## 2020-12-22 MED ORDER — PREGABALIN 50 MG PO CAPS: ORAL_CAPSULE | ORAL | 1 refills | Status: DC

## 2020-12-22 NOTE — Telephone Encounter (Signed)
Patient would like the following medications added to her [facility] orders - Pregabalin, aclovir, tizandine, and metformin Patient not due for medications at this time. She did have them filled at [facility] and is now requesting that they be sent to [facility].  Metformin was already sent [facility] on 12/13/20 #180 with no refills. Pregabalin LFD 10/30/20 #90 with 3 refills at [facility] Acyclovir not on current med list but patient is requesting Tizanidine LFD 11/06/20 #30 with 2 refills LOV 11/20/20 NOV none

## 2020-12-22 NOTE — Telephone Encounter (Signed)
Sent to pcp for approval 

## 2020-12-26 ENCOUNTER — Other Ambulatory Visit: Payer: Self-pay

## 2020-12-26 DIAGNOSIS — C9000 Multiple myeloma not having achieved remission: Secondary | ICD-10-CM

## 2020-12-26 MED ORDER — POTASSIUM CHLORIDE CRYS ER 20 MEQ PO TBCR: EXTENDED_RELEASE_TABLET | ORAL | 1 refills | Status: DC

## 2020-12-28 ENCOUNTER — Inpatient Hospital Stay: Payer: Medicare Other | Attending: Hematology

## 2020-12-28 ENCOUNTER — Inpatient Hospital Stay: Payer: Medicare Other

## 2020-12-28 ENCOUNTER — Other Ambulatory Visit: Payer: Medicare Other

## 2020-12-28 ENCOUNTER — Other Ambulatory Visit: Payer: Self-pay

## 2020-12-28 VITALS — BP 146/56 | HR 79 | Temp 98.4°F | Resp 18 | Ht 60.0 in | Wt 188.2 lb

## 2020-12-28 DIAGNOSIS — Z95828 Presence of other vascular implants and grafts: Secondary | ICD-10-CM

## 2020-12-28 DIAGNOSIS — D509 Iron deficiency anemia, unspecified: Secondary | ICD-10-CM

## 2020-12-28 DIAGNOSIS — Z87891 Personal history of nicotine dependence: Secondary | ICD-10-CM | POA: Diagnosis not present

## 2020-12-28 DIAGNOSIS — Z853 Personal history of malignant neoplasm of breast: Secondary | ICD-10-CM | POA: Diagnosis not present

## 2020-12-28 DIAGNOSIS — Z5112 Encounter for antineoplastic immunotherapy: Secondary | ICD-10-CM | POA: Insufficient documentation

## 2020-12-28 DIAGNOSIS — Z8673 Personal history of transient ischemic attack (TIA), and cerebral infarction without residual deficits: Secondary | ICD-10-CM | POA: Diagnosis not present

## 2020-12-28 DIAGNOSIS — C9000 Multiple myeloma not having achieved remission: Secondary | ICD-10-CM

## 2020-12-28 DIAGNOSIS — Z923 Personal history of irradiation: Secondary | ICD-10-CM | POA: Insufficient documentation

## 2020-12-28 DIAGNOSIS — N189 Chronic kidney disease, unspecified: Secondary | ICD-10-CM | POA: Diagnosis not present

## 2020-12-28 DIAGNOSIS — E1122 Type 2 diabetes mellitus with diabetic chronic kidney disease: Secondary | ICD-10-CM | POA: Insufficient documentation

## 2020-12-28 DIAGNOSIS — I252 Old myocardial infarction: Secondary | ICD-10-CM | POA: Insufficient documentation

## 2020-12-28 DIAGNOSIS — E1140 Type 2 diabetes mellitus with diabetic neuropathy, unspecified: Secondary | ICD-10-CM | POA: Diagnosis not present

## 2020-12-28 DIAGNOSIS — Z7189 Other specified counseling: Secondary | ICD-10-CM

## 2020-12-28 LAB — CMP (CANCER CENTER ONLY)
ALT: 8 U/L (ref 0–44)
AST: 13 U/L — ABNORMAL LOW (ref 15–41)
Albumin: 3.4 g/dL — ABNORMAL LOW (ref 3.5–5.0)
Alkaline Phosphatase: 75 U/L (ref 38–126)
Anion gap: 14 (ref 5–15)
BUN: 15 mg/dL (ref 8–23)
CO2: 20 mmol/L — ABNORMAL LOW (ref 22–32)
Calcium: 9.1 mg/dL (ref 8.9–10.3)
Chloride: 110 mmol/L (ref 98–111)
Creatinine: 1.87 mg/dL — ABNORMAL HIGH (ref 0.44–1.00)
GFR, Estimated: 28 mL/min — ABNORMAL LOW (ref >60)
Glucose, Bld: 157 mg/dL — ABNORMAL HIGH (ref 70–99)
Potassium: 4 mmol/L (ref 3.5–5.1)
Sodium: 144 mmol/L (ref 135–145)
Total Bilirubin: 0.3 mg/dL (ref 0.3–1.2)
Total Protein: 6.4 g/dL — ABNORMAL LOW (ref 6.5–8.1)

## 2020-12-28 LAB — CBC WITH DIFFERENTIAL/PLATELET
Abs Immature Granulocytes: 0.03 10*3/uL (ref 0.00–0.07)
Basophils Absolute: 0 10*3/uL (ref 0.0–0.1)
Basophils Relative: 0 %
Eosinophils Absolute: 0.1 10*3/uL (ref 0.0–0.5)
Eosinophils Relative: 1 %
HCT: 25.2 % — ABNORMAL LOW (ref 36.0–46.0)
Hemoglobin: 8.3 g/dL — ABNORMAL LOW (ref 12.0–15.0)
Immature Granulocytes: 1 %
Lymphocytes Relative: 22 %
Lymphs Abs: 1.3 10*3/uL (ref 0.7–4.0)
MCH: 32.8 pg (ref 26.0–34.0)
MCHC: 32.9 g/dL (ref 30.0–36.0)
MCV: 99.6 fL (ref 80.0–100.0)
Monocytes Absolute: 0.4 10*3/uL (ref 0.1–1.0)
Monocytes Relative: 6 %
Neutro Abs: 4.1 10*3/uL (ref 1.7–7.7)
Neutrophils Relative %: 70 %
Platelets: 255 10*3/uL (ref 150–400)
RBC: 2.53 MIL/uL — ABNORMAL LOW (ref 3.87–5.11)
RDW: 13.1 % (ref 11.5–15.5)
WBC: 5.9 10*3/uL (ref 4.0–10.5)
nRBC: 0 % (ref 0.0–0.2)

## 2020-12-28 MED ORDER — ALTEPLASE 2 MG IJ SOLR
2.0000 mg | Freq: Once | INTRAMUSCULAR | Status: DC | PRN
  Filled 2020-12-28: qty 2

## 2020-12-28 MED ORDER — SODIUM CHLORIDE 0.9% FLUSH
10.0000 mL | INTRAVENOUS | Status: DC | PRN
  Administered 2020-12-28: 10 mL
  Filled 2020-12-28: qty 10

## 2020-12-28 MED ORDER — ACETAMINOPHEN 325 MG PO TABS
ORAL_TABLET | ORAL | Status: AC
  Filled 2020-12-28: qty 2

## 2020-12-28 MED ORDER — DIPHENHYDRAMINE HCL 25 MG PO CAPS
ORAL_CAPSULE | ORAL | Status: AC
  Filled 2020-12-28: qty 2

## 2020-12-28 MED ORDER — SODIUM CHLORIDE 0.9 % IV SOLN
20.0000 mg | Freq: Once | INTRAVENOUS | Status: AC
  Administered 2020-12-28: 20 mg via INTRAVENOUS
  Filled 2020-12-28: qty 20

## 2020-12-28 MED ORDER — CYANOCOBALAMIN 1000 MCG/ML IJ SOLN
1000.0000 ug | Freq: Once | INTRAMUSCULAR | Status: AC
  Administered 2020-12-28: 1000 ug via SUBCUTANEOUS

## 2020-12-28 MED ORDER — SODIUM CHLORIDE 0.9 % IV SOLN
16.0000 mg/kg | Freq: Once | INTRAVENOUS | Status: AC
  Administered 2020-12-28: 1400 mg via INTRAVENOUS
  Filled 2020-12-28: qty 60

## 2020-12-28 MED ORDER — SODIUM CHLORIDE 0.9% FLUSH
3.0000 mL | Freq: Once | INTRAVENOUS | Status: DC | PRN
  Filled 2020-12-28: qty 10

## 2020-12-28 MED ORDER — FAMOTIDINE 20 MG IN NS 100 ML IVPB
INTRAVENOUS | Status: AC
  Filled 2020-12-28: qty 100

## 2020-12-28 MED ORDER — SODIUM CHLORIDE 0.9 % IV SOLN
Freq: Once | INTRAVENOUS | Status: AC
  Filled 2020-12-28: qty 250

## 2020-12-28 MED ORDER — FAMOTIDINE 20 MG IN NS 100 ML IVPB
20.0000 mg | Freq: Once | INTRAVENOUS | Status: AC
  Administered 2020-12-28: 20 mg via INTRAVENOUS

## 2020-12-28 MED ORDER — ACETAMINOPHEN 325 MG PO TABS
650.0000 mg | ORAL_TABLET | Freq: Once | ORAL | Status: AC
  Administered 2020-12-28: 650 mg via ORAL

## 2020-12-28 MED ORDER — CYANOCOBALAMIN 1000 MCG/ML IJ SOLN
INTRAMUSCULAR | Status: AC
  Filled 2020-12-28: qty 1

## 2020-12-28 MED ORDER — SODIUM CHLORIDE 0.9 % IV SOLN
Freq: Once | INTRAVENOUS | Status: DC
  Filled 2020-12-28: qty 250

## 2020-12-28 MED ORDER — SODIUM CHLORIDE 0.9% FLUSH
10.0000 mL | Freq: Once | INTRAVENOUS | Status: DC
  Administered 2020-12-28: 10 mL
  Filled 2020-12-28: qty 10

## 2020-12-28 MED ORDER — DIPHENHYDRAMINE HCL 25 MG PO CAPS
50.0000 mg | ORAL_CAPSULE | Freq: Once | ORAL | Status: AC
Start: 2020-12-28 — End: 2020-12-28
  Administered 2020-12-28: 50 mg via ORAL

## 2020-12-28 MED ORDER — HEPARIN SOD (PORK) LOCK FLUSH 100 UNIT/ML IV SOLN
500.0000 [IU] | Freq: Once | INTRAVENOUS | Status: AC | PRN
Start: 2020-12-28 — End: 2020-12-28
  Administered 2020-12-28: 500 [IU]
  Filled 2020-12-28: qty 5

## 2020-12-28 MED ORDER — HEPARIN SOD (PORK) LOCK FLUSH 100 UNIT/ML IV SOLN
250.0000 [IU] | Freq: Once | INTRAVENOUS | Status: DC | PRN
  Filled 2020-12-28: qty 5

## 2020-12-28 NOTE — Progress Notes (Signed)
Pt reports increased s/s weakness/fatigue with one fall last week at home due to weakness. Pt reports that she was able to "catch herself" and did not hit her head during the fall. Pt reports no injuries from the fall. Pt also reports s/s increased dyspnea at rest. Pt verbalizes concern that her hemoglobin is causing weakness/dyspnea. RN informed pt that her hemoglobin is within [facility] parameters. This RN verbalizes to pt that MD Kale's office will be made aware of her concerns. Pt verbalizes understanding and agrees with plan of care. Pt verbalizes understanding to call [facility] with any further concerns/questions.   Per MD Kale, no blood transfusion unless hemoglobin is less than 8.0. Pt made aware

## 2020-12-28 NOTE — Patient Instructions (Signed)
[facility]  Discharge Instructions: °Thank you for choosing [facility] to provide your oncology and hematology care.  ° °If you have a lab appointment with the Cancer Center, please go directly to the Cancer Center and check in at the registration area. °  °Wear comfortable clothing and clothing appropriate for easy access to any Portacath or PICC line.  ° °We strive to give you quality time with your provider. You may need to reschedule your appointment if you arrive late (15 or more minutes).  Arriving late affects you and other patients whose appointments are after yours.  Also, if you miss three or more appointments without notifying the office, you may be dismissed from the clinic at the provider’s discretion.    °  °For prescription refill requests, have your pharmacy contact our office and allow 72 hours for refills to be completed.   ° °Today you received the following chemotherapy and/or immunotherapy agents: Darzalex  °  °To help prevent nausea and vomiting after your treatment, we encourage you to take your nausea medication as directed. ° °BELOW ARE SYMPTOMS THAT SHOULD BE REPORTED IMMEDIATELY: °*FEVER GREATER THAN 100.4 F (38 °C) OR HIGHER °*CHILLS OR SWEATING °*NAUSEA AND VOMITING THAT IS NOT CONTROLLED WITH YOUR NAUSEA MEDICATION °*UNUSUAL SHORTNESS OF BREATH °*UNUSUAL BRUISING OR BLEEDING °*URINARY PROBLEMS (pain or burning when urinating, or frequent urination) °*BOWEL PROBLEMS (unusual diarrhea, constipation, pain near the anus) °TENDERNESS IN MOUTH AND THROAT WITH OR WITHOUT PRESENCE OF ULCERS (sore throat, sores in mouth, or a toothache) °UNUSUAL RASH, SWELLING OR PAIN  °UNUSUAL VAGINAL DISCHARGE OR ITCHING  ° °Items with * indicate a potential emergency and should be followed up as soon as possible or go to the Emergency Department if any problems should occur. ° °Please show the CHEMOTHERAPY ALERT CARD or IMMUNOTHERAPY ALERT CARD at check-in to the  Emergency Department and triage nurse. ° °Should you have questions after your visit or need to cancel or reschedule your appointment, please contact [facility]  Dept: [phone]  and follow the prompts.  Office hours are 8:00 a.m. to 4:30 p.m. Monday - Friday. Please note that voicemails left after 4:00 p.m. may not be returned until the following business day.  We are closed weekends and major holidays. You have access to a nurse at all times for urgent questions. Please call the main number to the clinic Dept: [phone] and follow the prompts. ° ° °For any non-urgent questions, you may also contact your provider using MyChart. We now offer e-Visits for anyone 18 and older to request care online for non-urgent symptoms. For details visit [email or web address]. °  °Also download the MyChart app! Go to the app store, search "MyChart", open the app, select [place], and log in with your MyChart username and password. ° °Due to Covid, a mask is required upon entering the hospital/clinic. If you do not have a mask, one will be given to you upon arrival. For doctor visits, patients may have 1 support person aged 18 or older with them. For treatment visits, patients cannot have anyone with them due to current Covid guidelines and our immunocompromised population.  ° °

## 2020-12-29 ENCOUNTER — Other Ambulatory Visit: Payer: Self-pay

## 2020-12-29 DIAGNOSIS — C9000 Multiple myeloma not having achieved remission: Secondary | ICD-10-CM

## 2020-12-29 MED ORDER — DEXAMETHASONE 2 MG PO TABS: ORAL_TABLET | ORAL | 0 refills | Status: DC

## 2020-12-29 MED ORDER — VITAMIN D (ERGOCALCIFEROL) 1.25 MG (50000 UNIT) PO CAPS: ORAL_CAPSULE | ORAL | 2 refills | Status: DC

## 2020-12-29 MED ORDER — POTASSIUM CHLORIDE CRYS ER 20 MEQ PO TBCR: EXTENDED_RELEASE_TABLET | ORAL | 1 refills | Status: DC

## 2020-12-29 MED ORDER — ACYCLOVIR 400 MG PO TABS: 400.0000 mg | ORAL_TABLET | Freq: Two times a day (BID) | ORAL | 2 refills | Status: DC

## 2020-12-31 LAB — MULTIPLE MYELOMA PANEL, SERUM
Albumin SerPl Elph-Mcnc: 3.3 g/dL (ref 2.9–4.4)
Albumin/Glob SerPl: 1.3 (ref 0.7–1.7)
Alpha 1: 0.2 g/dL (ref 0.0–0.4)
Alpha2 Glob SerPl Elph-Mcnc: 1.3 g/dL — ABNORMAL HIGH (ref 0.4–1.0)
B-Globulin SerPl Elph-Mcnc: 0.8 g/dL (ref 0.7–1.3)
Gamma Glob SerPl Elph-Mcnc: 0.3 g/dL — ABNORMAL LOW (ref 0.4–1.8)
Globulin, Total: 2.6 g/dL (ref 2.2–3.9)
IgA: 57 mg/dL — ABNORMAL LOW (ref 64–422)
IgG (Immunoglobin G), Serum: 405 mg/dL — ABNORMAL LOW (ref 586–1602)
IgM (Immunoglobulin M), Srm: 27 mg/dL (ref 26–217)
M Protein SerPl Elph-Mcnc: 0.1 g/dL — ABNORMAL HIGH
Total Protein ELP: 5.9 g/dL — ABNORMAL LOW (ref 6.0–8.5)

## 2021-01-08 ENCOUNTER — Telehealth: Payer: Self-pay | Admitting: Hematology

## 2021-01-08 NOTE — Telephone Encounter (Signed)
Unable to leave message with rescheduled upcoming appointment. Will inform at next visit.

## 2021-01-11 ENCOUNTER — Inpatient Hospital Stay: Payer: Medicare Other

## 2021-01-11 ENCOUNTER — Other Ambulatory Visit: Payer: Self-pay

## 2021-01-11 VITALS — BP 145/66 | HR 79 | Temp 98.7°F | Resp 18 | Wt 186.0 lb

## 2021-01-11 DIAGNOSIS — D509 Iron deficiency anemia, unspecified: Secondary | ICD-10-CM

## 2021-01-11 DIAGNOSIS — Z5112 Encounter for antineoplastic immunotherapy: Secondary | ICD-10-CM | POA: Diagnosis not present

## 2021-01-11 DIAGNOSIS — Z87891 Personal history of nicotine dependence: Secondary | ICD-10-CM | POA: Diagnosis not present

## 2021-01-11 DIAGNOSIS — I252 Old myocardial infarction: Secondary | ICD-10-CM | POA: Diagnosis not present

## 2021-01-11 DIAGNOSIS — C9000 Multiple myeloma not having achieved remission: Secondary | ICD-10-CM | POA: Diagnosis not present

## 2021-01-11 DIAGNOSIS — N189 Chronic kidney disease, unspecified: Secondary | ICD-10-CM | POA: Diagnosis not present

## 2021-01-11 DIAGNOSIS — Z95828 Presence of other vascular implants and grafts: Secondary | ICD-10-CM

## 2021-01-11 DIAGNOSIS — E1122 Type 2 diabetes mellitus with diabetic chronic kidney disease: Secondary | ICD-10-CM | POA: Diagnosis not present

## 2021-01-11 DIAGNOSIS — Z8673 Personal history of transient ischemic attack (TIA), and cerebral infarction without residual deficits: Secondary | ICD-10-CM | POA: Diagnosis not present

## 2021-01-11 DIAGNOSIS — E1140 Type 2 diabetes mellitus with diabetic neuropathy, unspecified: Secondary | ICD-10-CM | POA: Diagnosis not present

## 2021-01-11 DIAGNOSIS — Z923 Personal history of irradiation: Secondary | ICD-10-CM | POA: Diagnosis not present

## 2021-01-11 DIAGNOSIS — Z7189 Other specified counseling: Secondary | ICD-10-CM

## 2021-01-11 DIAGNOSIS — Z853 Personal history of malignant neoplasm of breast: Secondary | ICD-10-CM | POA: Diagnosis not present

## 2021-01-11 LAB — CBC WITH DIFFERENTIAL/PLATELET
Abs Immature Granulocytes: 0.01 10*3/uL (ref 0.00–0.07)
Basophils Absolute: 0 10*3/uL (ref 0.0–0.1)
Basophils Relative: 0 %
Eosinophils Absolute: 0.1 10*3/uL (ref 0.0–0.5)
Eosinophils Relative: 1 %
HCT: 26.1 % — ABNORMAL LOW (ref 36.0–46.0)
Hemoglobin: 8.8 g/dL — ABNORMAL LOW (ref 12.0–15.0)
Immature Granulocytes: 0 %
Lymphocytes Relative: 27 %
Lymphs Abs: 1.4 10*3/uL (ref 0.7–4.0)
MCH: 33.2 pg (ref 26.0–34.0)
MCHC: 33.7 g/dL (ref 30.0–36.0)
MCV: 98.5 fL (ref 80.0–100.0)
Monocytes Absolute: 0.3 10*3/uL (ref 0.1–1.0)
Monocytes Relative: 5 %
Neutro Abs: 3.3 10*3/uL (ref 1.7–7.7)
Neutrophils Relative %: 67 %
Platelets: 245 10*3/uL (ref 150–400)
RBC: 2.65 MIL/uL — ABNORMAL LOW (ref 3.87–5.11)
RDW: 13.1 % (ref 11.5–15.5)
WBC: 5 10*3/uL (ref 4.0–10.5)
nRBC: 0 % (ref 0.0–0.2)

## 2021-01-11 LAB — CMP (CANCER CENTER ONLY)
ALT: 8 U/L (ref 0–44)
AST: 11 U/L — ABNORMAL LOW (ref 15–41)
Albumin: 3.5 g/dL (ref 3.5–5.0)
Alkaline Phosphatase: 76 U/L (ref 38–126)
Anion gap: 12 (ref 5–15)
BUN: 15 mg/dL (ref 8–23)
CO2: 20 mmol/L — ABNORMAL LOW (ref 22–32)
Calcium: 9.6 mg/dL (ref 8.9–10.3)
Chloride: 111 mmol/L (ref 98–111)
Creatinine: 1.65 mg/dL — ABNORMAL HIGH (ref 0.44–1.00)
GFR, Estimated: 32 mL/min — ABNORMAL LOW (ref >60)
Glucose, Bld: 139 mg/dL — ABNORMAL HIGH (ref 70–99)
Potassium: 4.3 mmol/L (ref 3.5–5.1)
Sodium: 143 mmol/L (ref 135–145)
Total Bilirubin: 0.2 mg/dL — ABNORMAL LOW (ref 0.3–1.2)
Total Protein: 6.6 g/dL (ref 6.5–8.1)

## 2021-01-11 MED ORDER — SODIUM CHLORIDE 0.9 % IV SOLN
20.0000 mg | Freq: Once | INTRAVENOUS | Status: AC
  Administered 2021-01-11: 20 mg via INTRAVENOUS
  Filled 2021-01-11: qty 20

## 2021-01-11 MED ORDER — ACETAMINOPHEN 325 MG PO TABS
650.0000 mg | ORAL_TABLET | Freq: Once | ORAL | Status: AC
  Administered 2021-01-11: 650 mg via ORAL

## 2021-01-11 MED ORDER — SODIUM CHLORIDE 0.9 % IV SOLN
16.0000 mg/kg | Freq: Once | INTRAVENOUS | Status: AC
  Administered 2021-01-11: 1400 mg via INTRAVENOUS
  Filled 2021-01-11: qty 10

## 2021-01-11 MED ORDER — FAMOTIDINE 20 MG IN NS 100 ML IVPB
20.0000 mg | Freq: Once | INTRAVENOUS | Status: AC
  Administered 2021-01-11: 20 mg via INTRAVENOUS

## 2021-01-11 MED ORDER — SODIUM CHLORIDE 0.9 % IV SOLN
Freq: Once | INTRAVENOUS | Status: AC
  Filled 2021-01-11: qty 250

## 2021-01-11 MED ORDER — HEPARIN SOD (PORK) LOCK FLUSH 100 UNIT/ML IV SOLN
500.0000 [IU] | Freq: Once | INTRAVENOUS | Status: AC | PRN
  Administered 2021-01-11: 500 [IU]
  Filled 2021-01-11: qty 5

## 2021-01-11 MED ORDER — SODIUM CHLORIDE 0.9% FLUSH
10.0000 mL | INTRAVENOUS | Status: DC | PRN
  Administered 2021-01-11: 10 mL
  Filled 2021-01-11: qty 10

## 2021-01-11 MED ORDER — FAMOTIDINE 20 MG IN NS 100 ML IVPB
INTRAVENOUS | Status: AC
  Filled 2021-01-11: qty 100

## 2021-01-11 MED ORDER — CYANOCOBALAMIN 1000 MCG/ML IJ SOLN
INTRAMUSCULAR | Status: AC
  Filled 2021-01-11: qty 1

## 2021-01-11 MED ORDER — DIPHENHYDRAMINE HCL 25 MG PO CAPS
ORAL_CAPSULE | ORAL | Status: AC
  Filled 2021-01-11: qty 2

## 2021-01-11 MED ORDER — ZOLEDRONIC ACID 4 MG/100ML IV SOLN: 4.0000 mg | Freq: Once | INTRAVENOUS | Status: DC

## 2021-01-11 MED ORDER — CYANOCOBALAMIN 1000 MCG/ML IJ SOLN
1000.0000 ug | Freq: Once | INTRAMUSCULAR | Status: AC
  Administered 2021-01-11: 1000 ug via SUBCUTANEOUS

## 2021-01-11 MED ORDER — ACETAMINOPHEN 325 MG PO TABS
ORAL_TABLET | ORAL | Status: AC
  Filled 2021-01-11: qty 2

## 2021-01-11 MED ORDER — SODIUM CHLORIDE 0.9% FLUSH
10.0000 mL | Freq: Once | INTRAVENOUS | Status: AC
  Administered 2021-01-11: 10 mL
  Filled 2021-01-11: qty 10

## 2021-01-11 MED ORDER — DIPHENHYDRAMINE HCL 25 MG PO CAPS
50.0000 mg | ORAL_CAPSULE | Freq: Once | ORAL | Status: AC
  Administered 2021-01-11: 50 mg via ORAL

## 2021-01-11 NOTE — Progress Notes (Signed)
Scr increasing - 1.65 today. Baseline Scr in 9/21 when starting Zometa = 1.13 Borderline increase for holding today (Hold for Scr increase of 0.5 which would be a Scr of 1.63). MD on PAL. Re-evaluate in 2 weeks when here for next Daratumumab infusion.  Raul [place], [place], BCPS, BCOP 01/11/2021 12:41 PM

## 2021-01-12 ENCOUNTER — Other Ambulatory Visit: Payer: Medicare Other

## 2021-01-24 ENCOUNTER — Encounter: Payer: Self-pay | Admitting: *Deleted

## 2021-01-24 ENCOUNTER — Encounter: Payer: Self-pay | Admitting: Gastroenterology

## 2021-01-24 ENCOUNTER — Ambulatory Visit: Payer: Medicare Other | Admitting: Gastroenterology

## 2021-01-24 VITALS — BP 134/72 | HR 76 | Ht 60.0 in | Wt 185.0 lb

## 2021-01-24 DIAGNOSIS — D509 Iron deficiency anemia, unspecified: Secondary | ICD-10-CM

## 2021-01-24 NOTE — Patient Instructions (Addendum)
If you are age 77 or older, your body mass index should be between 23-30. Your Body mass index is 36.13 kg/m. If this is out of the aforementioned range listed, please consider follow up with your Primary Care Provider. _________________________________________________________  The [facility] providers would like to encourage you to use [facility] to communicate with providers for non-urgent requests or questions.  Due to long hold times on the telephone, sending your provider a message by [facility] may be a faster and more efficient way to get a response.  Please allow 48 business hours for a response.  Please remember that this is for non-urgent requests.  _________________________________________________________ Dennis Bast have been scheduled for a small bowel enteroscopy. Please follow written instructions given to you at your visit today. If you use inhalers (even only as needed), please bring them with you on the day of your procedure.  Due to recent changes in healthcare laws, you may see the results of your imaging and laboratory studies on MyChart before your provider has had a chance to review them.  We understand that in some cases there may be results that are confusing or concerning to you. Not all laboratory results come back in the same time frame and the provider may be waiting for multiple results in order to interpret others.  Please give [place] 48 hours in order for your provider to thoroughly review all the results before contacting the office for clarification of your results.   Thank you for entrusting me with your care and choosing [facility].  Dr Ardis Hughs

## 2021-01-24 NOTE — Progress Notes (Signed)
Review of pertinent gastrointestinal problems: 1. Intermittent abd pains, UGi symptoms: EGD Dr. Earlean Shawl, 05/2011. Done for "reflux symptoms despite therapy."  Findings, "normal EGD" .  2016 evaluation by Dr. Ardis Hughs intermittent epigastric pains, nausea; somewhatpositional.  CT scan 07/2014 without clear etiology EGD 08/2014 Dr. Ardis Hughs found minor gastritis: H. Pylori negative.  EGD 05/06/2020 found mild nonspecific gastritis.  Biopsies were negative for H. pylori.  Colonoscopy 04/2020 showed diverticulosis, hemorrhoids but was otherwise normal. 2. Routine risk for colon cancer: Colonoscopy Dr. Earlean Shawl, 05/2011.  Done for "screening."  Findings "inflammation at splenic flexure, medium hemorrhoids." pathology was normal.  He recommended repeat colonoscopy at 5 years for unclear reasons.  Colonoscopy 04/2020 no polyps or cancers. 3. IDA (multifactorial, chronic renal insuff, MM  and treatment for the MM may be primary drivers): Multiple EGDs, colonoscopies above. Also Kenneth study 07/2020 showed 2 small AVMs in upper SB, possibly reachable with enteroscopy.  No melena ever. On minor red blood on TP with BMs.  Patient tells me she has only required 1 or 2 blood transfusions for this.  HPI: This is a very pleasant 76 year old woman whom I last saw about 4 months ago here in the office   Hb checked numerous times in past several months; range 7.8 to 8.8, last was 8.8 two weeks ago.  She tells me that her hematologist is concerned that she might be losing blood internally and that she get back in here to talk with me about options for work-up and therapy.  She never has melena.  She does have minor intermittent rectal bleeding with blood on tissue paper about 2 or 3 times per year she tells me today.  She has no abdominal pains.  Her weight is overall stable, waxes and wanes periodically.  ROS: complete GI ROS as described in HPI, all other review negative.  Constitutional:  No unintentional weight  loss   Past Medical History:  Diagnosis Date   Angiodysplasia of intestine 08/23/2020   Anxiety    Arthritis    Breast cancer ([place]) 06/13/15   Cancer ([place]) 2000   breast cancer   Chronic bronchitis ([place])    Chronic bronchitis ([place])    Hyperlipidemia    Hypertension    Myocardial infarction [facility]) 2001   Personal history of radiation therapy    Restless leg    Stroke ([place]) 2004   TIA, no deficits    Past Surgical History:  Procedure Laterality Date   ABDOMINAL HYSTERECTOMY  1985   BREAST LUMPECTOMY Left 2000   radiation and chemo   BREAST LUMPECTOMY Right 2016   radiation   BREAST SURGERY  2001   lt breast lumpectomy   COLONOSCOPY WITH ESOPHAGOGASTRODUODENOSCOPY (EGD)  04/2020   GIVENS CAPSULE STUDY  07/2020   IR IMAGING GUIDED PORT INSERTION  04/25/2020   RADIOACTIVE SEED GUIDED PARTIAL MASTECTOMY WITH AXILLARY SENTINEL LYMPH NODE BIOPSY Right 07/07/2015   Procedure: RIGHT RADIOACTIVE SEED GUIDED PARTIAL MASTECTOMY WITH AXILLARY SENTINEL LYMPH NODE BIOPSY;  Surgeon: Autumn Messing III, MD;  Location: [place] [facility];  Service: General;  Laterality: Right;   SMALL INTESTINE SURGERY     TUBAL LIGATION      Current Outpatient Medications  Medication Sig Dispense Refill   Accu-Chek Softclix Lancets lancets Use as instructed to check sugars 1-2 times daily. 100 each 12   acyclovir (ZOVIRAX) 400 MG tablet Take 1 tablet (400 mg total) by mouth 2 (two) times daily. 60 tablet 2   albuterol (VENTOLIN HFA) 108 (90  Base) MCG/ACT inhaler Inhale 2 puffs into the lungs every 6 (six) hours as needed for wheezing or shortness of breath. 8 g 2   atorvastatin (LIPITOR) 10 MG tablet TAKE 1 TABLET BY MOUTH  DAILY 90 tablet 3   dexamethasone (DECADRON) 2 MG tablet Take 3 tabs (44m) po daily x3 days and then 2 tabs (49m po daily x3 days, and then 1 tab (60m39mpo daily x3 days 18 tablet 0   dexamethasone (DECADRON) 4 MG tablet Take 3 tabs with breakfast the day after each treatment. 20 tablet  4   doxycycline (VIBRA-TABS) 100 MG tablet Take 1 tablet (100 mg total) by mouth 2 (two) times daily. 14 tablet 0   fluticasone (FLOVENT HFA) 110 MCG/ACT inhaler Inhale 2 puffs into the lungs in the morning and at bedtime. 1 each 12   glucose blood (ACCU-CHEK GUIDE) test strip Use as instructed to check sugars 1-2 times daily. 100 each 12   lidocaine-prilocaine (EMLA) cream APPLY 1 APPLICATION TOPICALLY AS NEEDED. 30 g 0   loratadine (CLARITIN) 10 MG tablet Take 10 mg by mouth daily.     losartan (COZAAR) 100 MG tablet TAKE 1 TABLET BY MOUTH  DAILY 90 tablet 3   metFORMIN (GLUCOPHAGE) 500 MG tablet TAKE 1 TABLET(500 MG) BY MOUTH TWICE DAILY WITH A MEAL 180 tablet 1   mometasone (NASONEX) 50 MCG/ACT nasal spray Place 2 sprays into the nose daily. (Patient taking differently: Place 2 sprays into the nose as needed.) 17 g 12   ondansetron (ZOFRAN) 8 MG tablet Take 1 tablet (8 mg total) by mouth 2 (two) times daily as needed (Nausea or vomiting). 30 tablet 1   pantoprazole (PROTONIX) 40 MG tablet TAKE 1 TABLET BY MOUTH  TWICE DAILY 180 tablet 1   potassium chloride SA (KLOR-CON) 20 MEQ tablet TAKE 1 TABLET(20 MEQ) BY MOUTH TWICE DAILY 180 tablet 1   pregabalin (LYRICA) 50 MG capsule TAKE 1 CAPSULE(50 MG) BY MOUTH THREE TIMES DAILY 270 capsule 1   prochlorperazine (COMPAZINE) 10 MG tablet Take 1 tablet (10 mg total) by mouth every 6 (six) hours as needed (Nausea or vomiting). 30 tablet 1   tiZANidine (ZANAFLEX) 4 MG tablet TAKE 1 TABLET(4 MG) BY MOUTH AT BEDTIME 90 tablet 1   traZODone (DESYREL) 100 MG tablet TAKE 1 TABLET BY MOUTH AT  BEDTIME 90 tablet 2   Vitamin D, Ergocalciferol, (DRISDOL) 1.25 MG (50000 UNIT) CAPS capsule TAKE 1 CAPSULE BY MOUTH 1 TIME A WEEK 12 capsule 2   No current facility-administered medications for this visit.    Allergies as of 01/24/2021 - Review Complete 01/24/2021  Allergen Reaction Noted   Bacitracin-neomycin-polymyxin  [neomycin-bacitracin zn-polymyx] Swelling  12/29/2014   Nsaids Other (See Comments) 01/04/2015   Tape Hives 11/07/2013   Ambien [zolpidem tartrate]  04/02/2012   Amoxicillin  02/03/2012   Clavulanic acid Hives 11/20/2020   Contrast media [iodinated diagnostic agents] Hives 11/06/2013   Latex Swelling 09/14/2014   Prednisone  09/05/2014    Family History  Problem Relation Age of Onset   Emphysema Mother 62 16    smoker   Diabetes Father    Lung cancer Sister        dx. <50; former smoker   Diabetes Brother    Diabetes Brother    Brain cancer Brother 66 46    unknown tumor type   Stroke Maternal Grandmother    Diabetes Paternal Grandmother    Cancer Daughter 4566  neck cancer   Other Daughter        hysterectomy for unspecified reason   Colon cancer Daughter    Diabetes Paternal Aunt    Breast cancer Cousin    Cancer Cousin        unspecified type   Breast cancer Other        triple negative breast cancer in her 23s   Colon polyps Neg Hx    Esophageal cancer Neg Hx    Gallbladder disease Neg Hx     Social History   Socioeconomic History   Marital status: Divorced    Spouse name: Not on file   Number of children: 7   Years of education: Not on file   Highest education level: Not on file  Occupational History   Occupation: retired  Tobacco Use   Smoking status: Former    Packs/day: 1.00    Years: 20.00    Pack years: 20.00    Types: Cigarettes    Quit date: 07/30/2011    Years since quitting: 9.4   Smokeless tobacco: Never   Tobacco comments:    Quit >4 years ago; 1 ppd for about 5/20 years (remaining was less)  Vaping Use   Vaping Use: Former  Substance and Sexual Activity   Alcohol use: No    Alcohol/week: 0.0 standard drinks   Drug use: No   Sexual activity: Not Currently  Other Topics Concern   Not on file  Social History Narrative   Lives alone.  Retired.  Education:  11th grade GED.  Children:  7 (one here).    Social Determinants of Health   Financial Resource Strain: Low Risk     Difficulty of Paying Living Expenses: Not hard at all  Food Insecurity: No Food Insecurity   Worried About Charity fundraiser in the Last Year: Never true   Arboriculturist in the Last Year: Never true  Transportation Needs: Not on file  Physical Activity: Inactive   Days of Exercise per Week: 0 days   Minutes of Exercise per Session: 0 min  Stress: No Stress Concern Present   Feeling of Stress : Not at all  Social Connections: Moderately Isolated   Frequency of Communication with Friends and Family: More than three times a week   Frequency of Social Gatherings with Friends and Family: Once a week   Attends Religious Services: 1 to 4 times per year   Active Member of [employer] or Organizations: No   Attends Music therapist: Never   Marital Status: Divorced  Human resources officer Violence: Not At Risk   Fear of Current or Ex-Partner: No   Emotionally Abused: No   Physically Abused: No   Sexually Abused: No     Physical Exam: BP 134/72   Pulse 76   Ht 5' (1.524 m)   Wt 185 lb (83.9 kg)   BMI 36.13 kg/m  Constitutional: generally well-appearing Psychiatric: alert and oriented x3 Abdomen: soft, nontender, nondistended, no obvious ascites, no peritoneal signs, normal bowel sounds No peripheral edema noted in lower extremities  Assessment and plan: 75 y.o. female with multifactorial iron deficiency anemia  She has multiple myeloma and she is getting treatments for it.  Both multiple myeloma and the treatments for can contribute to anemia.  She has chronic renal insufficiency and this can contribute to anemia.  I do not think that her 2-3 times per year minor red blood on tissue paper would be able to contribute  significantly to her anemia.  She does have 2 small AVMs in the proximal small bowel on capsule endoscopy January 2022.  Perhaps does intermittently bleed.  Certainly she has never had any melena to suggest significant GI bleeding from these.  I think it is reasonable  to proceed with small bowel enteroscopy in the hospital setting to try to locate and treat these proximal small bowel AVMs.  She understands her risks and benefits to this and agrees to proceed.  I see no reason for any further blood tests or imaging studies prior to then.  Please see the "Patient Instructions" section for addition details about the plan.  Owens Loffler, MD [facility] 01/24/2021, 8:33 AM   Total time on date of encounter was 30 minutes (this included time spent preparing to see the patient reviewing records; obtaining and/or reviewing separately obtained history; performing a medically appropriate exam and/or evaluation; counseling and educating the patient and family if present; ordering medications, tests or procedures if applicable; and documenting clinical information in the health record).

## 2021-01-25 ENCOUNTER — Other Ambulatory Visit: Payer: Self-pay

## 2021-01-25 ENCOUNTER — Inpatient Hospital Stay: Payer: Medicare Other

## 2021-01-25 ENCOUNTER — Other Ambulatory Visit: Payer: Medicare Other

## 2021-01-25 ENCOUNTER — Ambulatory Visit: Payer: Medicare Other

## 2021-01-25 VITALS — BP 156/78 | HR 90 | Temp 98.7°F | Resp 20

## 2021-01-25 DIAGNOSIS — I252 Old myocardial infarction: Secondary | ICD-10-CM | POA: Diagnosis not present

## 2021-01-25 DIAGNOSIS — C9000 Multiple myeloma not having achieved remission: Secondary | ICD-10-CM | POA: Diagnosis not present

## 2021-01-25 DIAGNOSIS — Z95828 Presence of other vascular implants and grafts: Secondary | ICD-10-CM

## 2021-01-25 DIAGNOSIS — Z5112 Encounter for antineoplastic immunotherapy: Secondary | ICD-10-CM

## 2021-01-25 DIAGNOSIS — Z87891 Personal history of nicotine dependence: Secondary | ICD-10-CM | POA: Diagnosis not present

## 2021-01-25 DIAGNOSIS — E1122 Type 2 diabetes mellitus with diabetic chronic kidney disease: Secondary | ICD-10-CM | POA: Diagnosis not present

## 2021-01-25 DIAGNOSIS — Z923 Personal history of irradiation: Secondary | ICD-10-CM | POA: Diagnosis not present

## 2021-01-25 DIAGNOSIS — E1140 Type 2 diabetes mellitus with diabetic neuropathy, unspecified: Secondary | ICD-10-CM | POA: Diagnosis not present

## 2021-01-25 DIAGNOSIS — Z853 Personal history of malignant neoplasm of breast: Secondary | ICD-10-CM | POA: Diagnosis not present

## 2021-01-25 DIAGNOSIS — N189 Chronic kidney disease, unspecified: Secondary | ICD-10-CM | POA: Diagnosis not present

## 2021-01-25 DIAGNOSIS — Z8673 Personal history of transient ischemic attack (TIA), and cerebral infarction without residual deficits: Secondary | ICD-10-CM | POA: Diagnosis not present

## 2021-01-25 DIAGNOSIS — D509 Iron deficiency anemia, unspecified: Secondary | ICD-10-CM

## 2021-01-25 DIAGNOSIS — Z7189 Other specified counseling: Secondary | ICD-10-CM

## 2021-01-25 LAB — CBC WITH DIFFERENTIAL/PLATELET
Abs Immature Granulocytes: 0.05 10*3/uL (ref 0.00–0.07)
Basophils Absolute: 0 10*3/uL (ref 0.0–0.1)
Basophils Relative: 1 %
Eosinophils Absolute: 0.1 10*3/uL (ref 0.0–0.5)
Eosinophils Relative: 1 %
HCT: 26 % — ABNORMAL LOW (ref 36.0–46.0)
Hemoglobin: 8.9 g/dL — ABNORMAL LOW (ref 12.0–15.0)
Immature Granulocytes: 1 %
Lymphocytes Relative: 31 %
Lymphs Abs: 1.5 10*3/uL (ref 0.7–4.0)
MCH: 32.6 pg (ref 26.0–34.0)
MCHC: 34.2 g/dL (ref 30.0–36.0)
MCV: 95.2 fL (ref 80.0–100.0)
Monocytes Absolute: 0.4 10*3/uL (ref 0.1–1.0)
Monocytes Relative: 7 %
Neutro Abs: 2.8 10*3/uL (ref 1.7–7.7)
Neutrophils Relative %: 59 %
Platelets: 226 10*3/uL (ref 150–400)
RBC: 2.73 MIL/uL — ABNORMAL LOW (ref 3.87–5.11)
RDW: 12.7 % (ref 11.5–15.5)
WBC: 4.8 10*3/uL (ref 4.0–10.5)
nRBC: 0 % (ref 0.0–0.2)

## 2021-01-25 LAB — CMP (CANCER CENTER ONLY)
ALT: 11 U/L (ref 0–44)
AST: 15 U/L (ref 15–41)
Albumin: 3.4 g/dL — ABNORMAL LOW (ref 3.5–5.0)
Alkaline Phosphatase: 76 U/L (ref 38–126)
Anion gap: 10 (ref 5–15)
BUN: 21 mg/dL (ref 8–23)
CO2: 21 mmol/L — ABNORMAL LOW (ref 22–32)
Calcium: 9.1 mg/dL (ref 8.9–10.3)
Chloride: 112 mmol/L — ABNORMAL HIGH (ref 98–111)
Creatinine: 1.76 mg/dL — ABNORMAL HIGH (ref 0.44–1.00)
GFR, Estimated: 30 mL/min — ABNORMAL LOW (ref >60)
Glucose, Bld: 120 mg/dL — ABNORMAL HIGH (ref 70–99)
Potassium: 4.3 mmol/L (ref 3.5–5.1)
Sodium: 143 mmol/L (ref 135–145)
Total Bilirubin: <0.2 mg/dL — ABNORMAL LOW (ref 0.3–1.2)
Total Protein: 6.6 g/dL (ref 6.5–8.1)

## 2021-01-25 MED ORDER — SODIUM CHLORIDE 0.9 % IV SOLN
Freq: Once | INTRAVENOUS | Status: DC
  Filled 2021-01-25: qty 250

## 2021-01-25 MED ORDER — DIPHENHYDRAMINE HCL 25 MG PO CAPS
50.0000 mg | ORAL_CAPSULE | Freq: Once | ORAL | Status: AC
Start: 2021-01-25 — End: 2021-01-25
  Administered 2021-01-25: 50 mg via ORAL

## 2021-01-25 MED ORDER — FAMOTIDINE 20 MG IN NS 100 ML IVPB
20.0000 mg | Freq: Once | INTRAVENOUS | Status: AC
Start: 2021-01-25 — End: 2021-01-25
  Administered 2021-01-25: 20 mg via INTRAVENOUS

## 2021-01-25 MED ORDER — DIPHENHYDRAMINE HCL 25 MG PO CAPS
ORAL_CAPSULE | ORAL | Status: AC
  Filled 2021-01-25: qty 2

## 2021-01-25 MED ORDER — SODIUM CHLORIDE 0.9 % IV SOLN
16.0000 mg/kg | Freq: Once | INTRAVENOUS | Status: AC
  Administered 2021-01-25: 1400 mg via INTRAVENOUS
  Filled 2021-01-25: qty 10

## 2021-01-25 MED ORDER — SODIUM CHLORIDE 0.9 % IV SOLN
Freq: Once | INTRAVENOUS | Status: AC
  Filled 2021-01-25: qty 250

## 2021-01-25 MED ORDER — HEPARIN SOD (PORK) LOCK FLUSH 100 UNIT/ML IV SOLN
500.0000 [IU] | Freq: Once | INTRAVENOUS | Status: AC | PRN
Start: 2021-01-25 — End: 2021-01-25
  Administered 2021-01-25: 500 [IU]
  Filled 2021-01-25: qty 5

## 2021-01-25 MED ORDER — ACETAMINOPHEN 325 MG PO TABS
650.0000 mg | ORAL_TABLET | Freq: Once | ORAL | Status: AC
  Administered 2021-01-25: 650 mg via ORAL

## 2021-01-25 MED ORDER — ACETAMINOPHEN 325 MG PO TABS
ORAL_TABLET | ORAL | Status: AC
  Filled 2021-01-25: qty 2

## 2021-01-25 MED ORDER — FAMOTIDINE 20 MG IN NS 100 ML IVPB
INTRAVENOUS | Status: AC
  Filled 2021-01-25: qty 100

## 2021-01-25 MED ORDER — SODIUM CHLORIDE 0.9 % IV SOLN
20.0000 mg | Freq: Once | INTRAVENOUS | Status: AC
  Administered 2021-01-25: 20 mg via INTRAVENOUS
  Filled 2021-01-25: qty 20

## 2021-01-25 MED ORDER — SODIUM CHLORIDE 0.9% FLUSH
10.0000 mL | INTRAVENOUS | Status: DC | PRN
  Administered 2021-01-25: 10 mL
  Filled 2021-01-25: qty 10

## 2021-01-25 MED ORDER — CYANOCOBALAMIN 1000 MCG/ML IJ SOLN
1000.0000 ug | Freq: Once | INTRAMUSCULAR | Status: AC
Start: 2021-01-25 — End: 2021-01-25
  Administered 2021-01-25: 1000 ug via SUBCUTANEOUS

## 2021-01-25 MED ORDER — DIPHENHYDRAMINE HCL 50 MG/ML IJ SOLN
INTRAMUSCULAR | Status: AC
  Filled 2021-01-25: qty 1

## 2021-01-25 MED ORDER — CYANOCOBALAMIN 1000 MCG/ML IJ SOLN
INTRAMUSCULAR | Status: AC
  Filled 2021-01-25: qty 1

## 2021-01-25 NOTE — Patient Instructions (Signed)
[facility]  Discharge Instructions: °Thank you for choosing [facility] to provide your oncology and hematology care.  ° °If you have a lab appointment with the Cancer Center, please go directly to the Cancer Center and check in at the registration area. °  °Wear comfortable clothing and clothing appropriate for easy access to any Portacath or PICC line.  ° °We strive to give you quality time with your provider. You may need to reschedule your appointment if you arrive late (15 or more minutes).  Arriving late affects you and other patients whose appointments are after yours.  Also, if you miss three or more appointments without notifying the office, you may be dismissed from the clinic at the provider’s discretion.    °  °For prescription refill requests, have your pharmacy contact our office and allow 72 hours for refills to be completed.   ° °Today you received the following chemotherapy and/or immunotherapy agents: Darzalex  °  °To help prevent nausea and vomiting after your treatment, we encourage you to take your nausea medication as directed. ° °BELOW ARE SYMPTOMS THAT SHOULD BE REPORTED IMMEDIATELY: °*FEVER GREATER THAN 100.4 F (38 °C) OR HIGHER °*CHILLS OR SWEATING °*NAUSEA AND VOMITING THAT IS NOT CONTROLLED WITH YOUR NAUSEA MEDICATION °*UNUSUAL SHORTNESS OF BREATH °*UNUSUAL BRUISING OR BLEEDING °*URINARY PROBLEMS (pain or burning when urinating, or frequent urination) °*BOWEL PROBLEMS (unusual diarrhea, constipation, pain near the anus) °TENDERNESS IN MOUTH AND THROAT WITH OR WITHOUT PRESENCE OF ULCERS (sore throat, sores in mouth, or a toothache) °UNUSUAL RASH, SWELLING OR PAIN  °UNUSUAL VAGINAL DISCHARGE OR ITCHING  ° °Items with * indicate a potential emergency and should be followed up as soon as possible or go to the Emergency Department if any problems should occur. ° °Please show the CHEMOTHERAPY ALERT CARD or IMMUNOTHERAPY ALERT CARD at check-in to the  Emergency Department and triage nurse. ° °Should you have questions after your visit or need to cancel or reschedule your appointment, please contact [facility]  Dept: [phone]  and follow the prompts.  Office hours are 8:00 a.m. to 4:30 p.m. Monday - Friday. Please note that voicemails left after 4:00 p.m. may not be returned until the following business day.  We are closed weekends and major holidays. You have access to a nurse at all times for urgent questions. Please call the main number to the clinic Dept: [phone] and follow the prompts. ° ° °For any non-urgent questions, you may also contact your provider using MyChart. We now offer e-Visits for anyone 18 and older to request care online for non-urgent symptoms. For details visit [email or web address]. °  °Also download the MyChart app! Go to the app store, search "MyChart", open the app, select [place], and log in with your MyChart username and password. ° °Due to Covid, a mask is required upon entering the hospital/clinic. If you do not have a mask, one will be given to you upon arrival. For doctor visits, patients may have 1 support person aged 18 or older with them. For treatment visits, patients cannot have anyone with them due to current Covid guidelines and our immunocompromised population.  ° °

## 2021-01-30 ENCOUNTER — Other Ambulatory Visit: Payer: Self-pay

## 2021-01-30 ENCOUNTER — Ambulatory Visit
Admission: RE | Admit: 2021-01-30 | Discharge: 2021-01-30 | Disposition: A | Discharge status: Home / Self Care | Payer: Medicare Other | Source: Ambulatory Visit | Attending: Family Medicine | Admitting: Family Medicine

## 2021-01-30 DIAGNOSIS — Z780 Asymptomatic menopausal state: Secondary | ICD-10-CM

## 2021-01-30 LAB — MULTIPLE MYELOMA PANEL, SERUM
Albumin SerPl Elph-Mcnc: 3.5 g/dL (ref 2.9–4.4)
Albumin/Glob SerPl: 1.3 (ref 0.7–1.7)
Alpha 1: 0.2 g/dL (ref 0.0–0.4)
Alpha2 Glob SerPl Elph-Mcnc: 1.3 g/dL — ABNORMAL HIGH (ref 0.4–1.0)
B-Globulin SerPl Elph-Mcnc: 1 g/dL (ref 0.7–1.3)
Gamma Glob SerPl Elph-Mcnc: 0.4 g/dL (ref 0.4–1.8)
Globulin, Total: 2.9 g/dL (ref 2.2–3.9)
IgA: 53 mg/dL — ABNORMAL LOW (ref 64–422)
IgG (Immunoglobin G), Serum: 422 mg/dL — ABNORMAL LOW (ref 586–1602)
IgM (Immunoglobulin M), Srm: 29 mg/dL (ref 26–217)
M Protein SerPl Elph-Mcnc: 0.1 g/dL — ABNORMAL HIGH
Total Protein ELP: 6.4 g/dL (ref 6.0–8.5)

## 2021-02-05 ENCOUNTER — Other Ambulatory Visit: Payer: Self-pay | Admitting: Family Medicine

## 2021-02-05 ENCOUNTER — Telehealth: Payer: Self-pay | Admitting: Family Medicine

## 2021-02-05 NOTE — Telephone Encounter (Signed)
Patient called and states that this order needs to be put in today because she is out of this medication and [facility] needs to expedite this.

## 2021-02-07 NOTE — Progress Notes (Signed)
HEMATOLOGY/ONCOLOGY CLINIC NOTE  Date of Service: 02/08/2021  Patient Care Team: [name], MD as PCP - General (Family Medicine) [name], MD as Attending Physician (Physical Medicine and Rehabilitation) [name], MD as Consulting Physician (Orthopedic Surgery) [name], MD as Consulting Physician (Otolaryngology) [name], MD as Consulting Physician (Gastroenterology) [name], MD as Consulting Physician (General Surgery) [name], [facility] as Pharmacist (Pharmacist)  CHIEF COMPLAINTS/PURPOSE OF CONSULTATION:  Multiple Myeloma not having achieved remission  HISTORY OF PRESENTING ILLNESS:  Tracy [name] is a wonderful 75 y.o. female who has been referred to [place] by Dr [name] for evaluation and management of Multiple Myeloma. Pt is accompanied today by her daughter in person and other daughter and granddaughter via phone. The pt reports that she is doing well overall.    When pt was first diagnosed with Breast Cancer it was localized in both of her breasts and was considered Stage 1. She then received chemotherapy. However, she declined chemotherapy a second time and chose radiation therapy after recurrence. Pt has been under the surveillance of Dr. [name] after declining antiestrogen therapy.    The pt reports that she was having difficulty breathing so she saw her Pulmonologist, who ordered the work up. Based on the results of the PET/CT pt was sent to Dr. [name] as they were concerned that her breast cancer had spread. Pt has been becoming increasingly anemic over the last year, despite using a PO Iron supplement. She has had a positive Fecal Occult tests, but has been having issues with hemorrhoids and hemorrhoidal bleeding for the last 5-6 months. Pt denies any blood in her stools but notices blood on the tissue after she wipes. Pt has also been having some discomfort in her left hip and lower back. This pain is worsened when she sits down.     Pt has had a chronic cough that has been thought to either be Bronchitis or a result of acid reflux. Pt is planning to receive an Endoscopy to work this up further. She is currently taking Gabapentin for tingling/burning in her extremities. Pt has Type II Diabetes and was previously using Metformin, but discontinued due to concern for liver damage. She is not currently using any medications to control her Diabetes.    Pt had a heart attack in 2001. She initially though that she had a [employer], until the sensation began to travel up her body. She was given Nitroglycerin upon admittance to the hospital. Pt did not require any stents or other interventions. No cause for her heart attack was ever discovered. Pt has been seen by Neurology, Dr. Leta Baptist, who completed imaging on her neck and found a degeneratve disk. Pt has not had a nerve study conducted. Pt has no history of Shingles and has not received the Shingles vaccine. She has been fully vaccinated against he COVID19 virus.      Of note prior to the patient's visit today, pt has had PET/CT ([id number]) completed on 01/20/2020 with results revealing "1. Hypermetabolism corresponding to left acetabular and L5 lytic lesions, with differential considerations of metastatic disease or myeloma. 2. No evidence of hypermetabolic soft tissue primary, metastatic disease, or soft tissue myeloma. 3. Incidental findings, including: Aortic atherosclerosis (ICD10-I70.0) and emphysema (ICD10-J43.9). Hepatic steatosis."    Pt has had Bone Marrow Surgical Pathology ([id number]) completed on 01/17/2020 which revealed "BONE MARROW, ASPIRATE, CLOT, CORE: -Hypercellular bone marrow for age with plasma cell neoplasm PERIPHERAL BLOOD: -Normocytic-normochromic anemia"  Pt has had Left [place]chium Biopsy Surgical Pathology Report [id number]) completed on 01/17/2020 which revealed "Plasma cell neoplasm."   Most recent lab results (01/25/2020) of CBC is as follows:  all values are WNL except for RBC at 2.44, Hgb at 7.5, HCT at 24.0, CO2 at 21, Glucose at 129, Creatinine at 1.38, Total Protein at 10.1, Albumin at 2.8, Total Bilirubin at <0.2, GFR Est Afr Am at 43. 01/25/2020 K/L light chains is as follows: Kappa free light chain at 22.7, Lamda free light chains at 17.7, K/L light chain ratio at 1.28 01/25/2020 MMP is as follows: all values are WNL except for IgG at 5220, Total Protein at 10.1, Alpha2 Glob at 1.2, Gamma Glob at 3.9, M Protein at 3.7, Total Globulin at 6.7, Albumin/Glob at 0.6. 01/25/2020 24-hr UPEP shows all values are WNL   On review of systems, pt reports lower back/left hip pain, unexpected weight loss, abdominal pain, loose stools and denies low appetite, constipation, rashes and any other symptoms.    On PMHx the pt reports Breast Cancer, Breast Lumpectomy, TIA, Restless leg, Myocardial infarction, HTN, HLD, Chronic Bronchitis.  INTERVAL HISTORY:  Tracy [name] is a wonderful 75 y.o. female who is here for evaluation and management of Multiple Myeloma. She is here for maintenance Daratumumab and Dexamethasone. The patient's last visit with [place] was on 11/30/2020. The pt reports that she is doing well overall.   The pt reports that she has been doing well with no new symptoms or concerns. She notes her sciatica pain is continuing to keep her from moving around as much as she wants. She has arthritis in her back and this also causes some pain as well with exertion. At home, she does not need a cane or walker. She is able to move around well herself and function independently. The patient will be getting an enteroscopy with Dr. Ardis Hughs on August 18. She has already received her second COVID booster and Evusheld.  Lab results today 02/08/2021 of CBC w/diff and CMP is as follows: all values are WNL except for RBC of 2.88, Hgb of 9.3, HCT of 26.9, CO2 of 21, Glucose of 105, Creatinine of 1.61, Albumin of 3.3, AST of 12, Total Bilirubin of <0.2, GFR  est of 33. 02/08/2021 MMP - M spikw 0.1g/dl  On review of systems, pt reports continued sciatica pain, neck pain, swelling at [facility] site and denies bloody/black stools, decreased appetite, leg swelling, infection issues, and any other symptoms.   MEDICAL HISTORY:  Past Medical History:  Diagnosis Date   Angiodysplasia of intestine 08/23/2020   Anxiety    Arthritis    Breast cancer ([place]) 06/13/15   Cancer ([place]) 2000   breast cancer   Chronic bronchitis ([place])    Chronic bronchitis ([place])    Hyperlipidemia    Hypertension    Myocardial infarction [facility]) 2001   Personal history of radiation therapy    Restless leg    Stroke ([place]) 2004   TIA, no deficits    SURGICAL HISTORY: Past Surgical History:  Procedure Laterality Date   ABDOMINAL HYSTERECTOMY  1985   BREAST LUMPECTOMY Left 2000   radiation and chemo   BREAST LUMPECTOMY Right 2016   radiation   BREAST SURGERY  2001   lt breast lumpectomy   COLONOSCOPY WITH ESOPHAGOGASTRODUODENOSCOPY (EGD)  04/2020   GIVENS CAPSULE STUDY  07/2020   IR IMAGING GUIDED PORT INSERTION  04/25/2020   RADIOACTIVE SEED GUIDED PARTIAL MASTECTOMY WITH AXILLARY SENTINEL LYMPH NODE BIOPSY Right  07/07/2015   Procedure: RIGHT RADIOACTIVE SEED GUIDED PARTIAL MASTECTOMY WITH AXILLARY SENTINEL LYMPH NODE BIOPSY;  Surgeon: Autumn Messing III, MD;  Location: [facility];  Service: General;  Laterality: Right;   SMALL INTESTINE SURGERY     TUBAL LIGATION      SOCIAL HISTORY: Social History   Socioeconomic History   Marital status: Divorced    Spouse name: Not on file   Number of children: 7   Years of education: Not on file   Highest education level: Not on file  Occupational History   Occupation: retired  Tobacco Use   Smoking status: Former    Packs/day: 1.00    Years: 20.00    Pack years: 20.00    Types: Cigarettes    Quit date: 07/30/2011    Years since quitting: 9.5   Smokeless tobacco: Never   Tobacco comments:    Quit >4 years  ago; 1 ppd for about 5/20 years (remaining was less)  Vaping Use   Vaping Use: Former  Substance and Sexual Activity   Alcohol use: No    Alcohol/week: 0.0 standard drinks   Drug use: No   Sexual activity: Not Currently  Other Topics Concern   Not on file  Social History Narrative   Lives alone.  Retired.  Education:  11th grade GED.  Children:  7 (one here).    Social Determinants of Health   Financial Resource Strain: Low Risk    Difficulty of Paying Living Expenses: Not hard at all  Food Insecurity: No Food Insecurity   Worried About Charity fundraiser in the Last Year: Never true   Arboriculturist in the Last Year: Never true  Transportation Needs: Not on file  Physical Activity: Inactive   Days of Exercise per Week: 0 days   Minutes of Exercise per Session: 0 min  Stress: No Stress Concern Present   Feeling of Stress : Not at all  Social Connections: Moderately Isolated   Frequency of Communication with Friends and Family: More than three times a week   Frequency of Social Gatherings with Friends and Family: Once a week   Attends Religious Services: 1 to 4 times per year   Active Member of [employer] or Organizations: No   Attends Music therapist: Never   Marital Status: Divorced  Human resources officer Violence: Not At Risk   Fear of Current or Ex-Partner: No   Emotionally Abused: No   Physically Abused: No   Sexually Abused: No    FAMILY HISTORY: Family History  Problem Relation Age of Onset   Emphysema Mother 69       smoker   Diabetes Father    Lung cancer Sister        dx. <50; former smoker   Diabetes Brother    Diabetes Brother    Brain cancer Brother 46       unknown tumor type   Stroke Maternal Grandmother    Diabetes Paternal Grandmother    Cancer Daughter 80       neck cancer   Other Daughter        hysterectomy for unspecified reason   Colon cancer Daughter    Diabetes Paternal Aunt    Breast cancer Cousin    Cancer Cousin         unspecified type   Breast cancer Other        triple negative breast cancer in her 44s   Colon polyps Neg Hx  Esophageal cancer Neg Hx    Gallbladder disease Neg Hx     ALLERGIES:  is allergic to bacitracin-neomycin-polymyxin  [neomycin-bacitracin zn-polymyx], nsaids, tape, ambien [zolpidem tartrate], amoxicillin, clavulanic acid, contrast media [iodinated diagnostic agents], latex, and prednisone.  MEDICATIONS:  Current Outpatient Medications  Medication Sig Dispense Refill   Accu-Chek Softclix Lancets lancets Use as instructed to check sugars 1-2 times daily. 100 each 12   acyclovir (ZOVIRAX) 400 MG tablet Take 1 tablet (400 mg total) by mouth 2 (two) times daily. 60 tablet 2   albuterol (VENTOLIN HFA) 108 (90 Base) MCG/ACT inhaler Inhale 2 puffs into the lungs every 6 (six) hours as needed for wheezing or shortness of breath. 8 g 2   atorvastatin (LIPITOR) 10 MG tablet TAKE 1 TABLET BY MOUTH  DAILY 90 tablet 3   dexamethasone (DECADRON) 2 MG tablet Take 3 tabs (56m) po daily x3 days and then 2 tabs (413m po daily x3 days, and then 1 tab (76m63mpo daily x3 days 18 tablet 0   dexamethasone (DECADRON) 4 MG tablet Take 3 tabs with breakfast the day after each treatment. 20 tablet 4   doxycycline (VIBRA-TABS) 100 MG tablet Take 1 tablet (100 mg total) by mouth 2 (two) times daily. 14 tablet 0   fluticasone (FLOVENT HFA) 110 MCG/ACT inhaler Inhale 2 puffs into the lungs in the morning and at bedtime. 1 each 12   glucose blood (ACCU-CHEK GUIDE) test strip Use as instructed to check sugars 1-2 times daily. 100 each 12   lidocaine-prilocaine (EMLA) cream APPLY 1 APPLICATION TOPICALLY AS NEEDED. 30 g 0   loratadine (CLARITIN) 10 MG tablet Take 10 mg by mouth daily.     losartan (COZAAR) 100 MG tablet TAKE 1 TABLET BY MOUTH  DAILY 90 tablet 3   metFORMIN (GLUCOPHAGE) 500 MG tablet TAKE 1 TABLET(500 MG) BY MOUTH TWICE DAILY WITH A MEAL 180 tablet 1   mometasone (NASONEX) 50 MCG/ACT nasal spray  Place 2 sprays into the nose daily. (Patient taking differently: Place 2 sprays into the nose as needed.) 17 g 12   ondansetron (ZOFRAN) 8 MG tablet Take 1 tablet (8 mg total) by mouth 2 (two) times daily as needed (Nausea or vomiting). 30 tablet 1   pantoprazole (PROTONIX) 40 MG tablet TAKE 1 TABLET BY MOUTH  TWICE DAILY 180 tablet 0   potassium chloride SA (KLOR-CON) 20 MEQ tablet TAKE 1 TABLET(20 MEQ) BY MOUTH TWICE DAILY 180 tablet 1   pregabalin (LYRICA) 50 MG capsule TAKE 1 CAPSULE(50 MG) BY MOUTH THREE TIMES DAILY 270 capsule 1   prochlorperazine (COMPAZINE) 10 MG tablet Take 1 tablet (10 mg total) by mouth every 6 (six) hours as needed (Nausea or vomiting). 30 tablet 1   tiZANidine (ZANAFLEX) 4 MG tablet TAKE 1 TABLET(4 MG) BY MOUTH AT BEDTIME 90 tablet 1   traZODone (DESYREL) 100 MG tablet TAKE 1 TABLET BY MOUTH AT  BEDTIME 90 tablet 2   Vitamin D, Ergocalciferol, (DRISDOL) 1.25 MG (50000 UNIT) CAPS capsule TAKE 1 CAPSULE BY MOUTH 1 TIME A WEEK 12 capsule 2   No current facility-administered medications for this visit.   Facility-Administered Medications Ordered in Other Visits  Medication Dose Route Frequency Provider Last Rate Last Admin   daratumumab (DARZALEX) 1,400 mg in sodium chloride 0.9 % 430 mL chemo infusion  16 mg/kg (Treatment Plan Recorded) Intravenous Once KalBrunetta GeneraD       dexamethasone (DECADRON) 20 mg in sodium chloride 0.9 % 50 mL IVPB  20 mg Intravenous Once [name], MD       famotidine (PEPCID) IVPB 20 mg in NS 100 mL IVPB  20 mg Intravenous Once [name], MD 400 mL/hr at 02/08/21 1105 20 mg at 02/08/21 1105    REVIEW OF SYSTEMS:   10 Point review of Systems was done is negative except as noted above.  PHYSICAL EXAMINATION: ECOG PERFORMANCE STATUS: 2 - Symptomatic, <50% confined to bed   Exam was given in a wheelchair.   GENERAL:alert, in no acute distress and comfortable SKIN: no acute rashes, no significant  lesions EYES: conjunctiva are pink and non-injected, sclera anicteric OROPHARYNX: MMM, no exudates, no oropharyngeal erythema or ulceration NECK: supple, no JVD LYMPH:  no palpable lymphadenopathy in the cervical, axillary or inguinal regions LUNGS: clear to auscultation b/l with normal respiratory effort HEART: regular rate & rhythm ABDOMEN:  normoactive bowel sounds , non tender, not distended. Extremity: no pedal edema PSYCH: alert & oriented x 3 with fluent speech NEURO: no focal motor/sensory deficits  LABORATORY DATA:  I have reviewed the data as listed  . CBC Latest Ref Rng & Units 02/08/2021 01/25/2021 01/11/2021  WBC 4.0 - 10.5 K/uL 4.6 4.8 5.0  Hemoglobin 12.0 - 15.0 g/dL 9.3(L) 8.9(L) 8.8(L)  Hematocrit 36.0 - 46.0 % 26.9(L) 26.0(L) 26.1(L)  Platelets 150 - 400 K/uL 245 226 245    . CMP Latest Ref Rng & Units 02/08/2021 01/25/2021 01/11/2021  Glucose 70 - 99 mg/dL 105(H) 120(H) 139(H)  BUN 8 - 23 mg/dL '17 21 15  ' Creatinine 0.44 - 1.00 mg/dL 1.61(H) 1.76(H) 1.65(H)  Sodium 135 - 145 mmol/L 142 143 143  Potassium 3.5 - 5.1 mmol/L 4.7 4.3 4.3  Chloride 98 - 111 mmol/L 109 112(H) 111  CO2 22 - 32 mmol/L 21(L) 21(L) 20(L)  Calcium 8.9 - 10.3 mg/dL 9.2 9.1 9.6  Total Protein 6.5 - 8.1 g/dL 6.5 6.6 6.6  Total Bilirubin 0.3 - 1.2 mg/dL <0.2(L) <0.2(L) 0.2(L)  Alkaline Phos 38 - 126 U/L 72 76 76  AST 15 - 41 U/L 12(L) 15 11(L)  ALT 0 - 44 U/L '11 11 8     ' 05/16/2020 Upper Endoscopy:   05/16/2020 Colonoscopy:   01/25/2020 K/L light chains:    01/25/2020 MMP:            RADIOGRAPHIC STUDIES: I have personally reviewed the radiological images as listed and agreed with the findings in the report. DEXAScan  Result Date: 01/30/2021 EXAM: DUAL X-RAY ABSORPTIOMETRY (DXA) FOR BONE MINERAL DENSITY IMPRESSION: Referring Physician:  [name] Your patient completed a bone mineral density test using GE Lunar iDXA system (analysis version: 16). Technologist: [place]  PATIENT: Name: Jeiry, Birnbaum Patient ID: [id number] Birth Date: [birth year] Height: 59.5 in. Sex: Female Measured: 01/30/2021 Weight: 184.6 lbs. Indications: Advanced Age, Breast Cancer History, Estrogen Deficient, Hysterectomy, Postmenopausal Fractures: None Treatments: Vitamin D (E933.5) ASSESSMENT: The BMD measured at Femur Neck Right is 0.893 g/cm2 with a T-score of -1.0. This patient is considered normal according to [facility]) criteria. The quality of the exam is good. Site Region Measured Date Measured Age YA BMD Significant CHANGE T-score AP Spine L1-L4 01/30/2021 76.8 -0.3 1.135 g/cm2 * AP Spine L1-L4 07/05/2016 72.2 -1.0 1.069 g/cm2 * DualFemur Neck Right 01/30/2021    76.8         -1.0    0.893 g/cm2 DualFemur Neck Right 07/05/2016    72.2         -0.9  0.906 g/cm2 DualFemur Total Mean 01/30/2021    76.8         -0.7    0.920 g/cm2 DualFemur Total Mean 07/05/2016    72.2         -0.6    0.931 g/cm2 World Health Organization [facility]) criteria for post-menopausal, Caucasian Women: Normal       T-score at or above -1 SD Osteopenia   T-score between -1 and -2.5 SD Osteoporosis T-score at or below -2.5 SD RECOMMENDATION: 1. All patients should optimize calcium and vitamin D intake. 2. Consider FDA-approved medical therapies in postmenopausal women and men aged 76 years and older, based on the following: a. A hip or vertebral (clinical or morphometric) fracture. b. T-score = -2.5 at the femoral neck or spine after appropriate evaluation to exclude secondary causes. c. Low bone mass (T-score between -1.0 and -2.5 at the femoral neck or spine) and a 10-year probability of a hip fracture = 3% or a 10-year probability of a major osteoporosis-related fracture = 20% based on the [place]-adapted WHO algorithm. d. Clinician judgment and/or patient preferences may indicate treatment for people with 10-year fracture probabilities above or below these levels. FOLLOW-UP: Patients with diagnosis of  osteoporosis or at high risk for fracture should have regular bone mineral density tests.? Patients eligible for Medicare are allowed routine testing every 2 years.? The testing frequency can be increased to one year for patients who have rapidly progressing disease, are receiving or discontinuing medical therapy to restore bone mass, or have additional risk factors. I have reviewed this study and agree with the findings. Mark A. Thornton Papas, M.D. [facility] Electronically Signed   By: Lavonia Dana M.D.   On: 01/30/2021 16:42   08/08/2020 Capsule Endoscopy   ASSESSMENT & PLAN:   75 yo with   1) Newly diagnosed IgG Kappa Multiple myeloma with bone lesions, anemia, renal insuff. M spike @ 3.7g/dl on diagnosis. 1p deletion, polymorphic variant, 13q deletion 2) h/o Dm2 3) Diabetic Neuropathy 4) CKD - likely from DM2, but could have an element of myeloma kidney. 5) h/o TIA and AMI 6) Iron deficiency   PLAN: -Discussed pt labwork today, 02/08/2021; counts and chemistries very stable. -Advised pt that if m-protein remains stable based on these labs today, we will move Daratumumab to once monthly. This will start with Cycle 15. -Discussed symptoms to be aware of : CRAB criteria. We pick this up from all the labwork. -Recommended pt do 20-30 min walking daily and stay active. Avoid bending and twisting movements. -Continue Long Neck Vitamin B12 q2weeks. -The pt has no prohibitive toxicities from continuing maintenance Daratumumab q2weeks at this time. -Advised pt to inform us if there is redness or swelling around the [place] site. Will get [place] due to pt's concern regarding a potential clot. -Will hold Zometa due to kidney numbers. Will consider switch to Aredia and change to every 2 months, not monthly. -Continue 50000 IU Vitamin D weekly. -Start Vitamin B-Complex daily. -Will get US Venous R Neck ASAP. -Will see back in 4 weeks with C15D1 with labs.   FOLLOW UP: Holdign zometa. Switching to  Aredia every 8 week starting in 1 month -- plz schedule 4 doses Switching to Daratumumab every 4 weeks from C15. Plz schedule C15 and C16 as per orders. Portflush and labs with each treatment MD visit with C15D1 and C16D1 of treatment Plz change B12 to every 4 weeks with C15 of Daratumumab and every subsequent treatment -US venous rt upper extremity/neck  The total time spent in the appointment was 35 minutes and more than 50% was on counseling and direct patient cares.  All of the patient's questions were answered with apparent satisfaction. The patient knows to call the clinic with any problems, questions or concerns.   [name] MD [place] AAHIVMS [facility] Hematology/Oncology Physician [facility]  (Office):       [phone] (Work cell):  [phone] (Fax):           [phone]  02/08/2021 11:14 AM   I, Reinaldo Raddle, am acting as scribe for Dr. [name], MD.  .I have reviewed the above documentation for accuracy and completeness, and I agree with the above. [name] MD

## 2021-02-07 NOTE — Telephone Encounter (Signed)
Encounter started in error.

## 2021-02-08 ENCOUNTER — Encounter: Payer: Self-pay | Admitting: Hematology

## 2021-02-08 ENCOUNTER — Inpatient Hospital Stay: Payer: Medicare Other | Attending: Hematology

## 2021-02-08 ENCOUNTER — Other Ambulatory Visit: Payer: Self-pay

## 2021-02-08 ENCOUNTER — Inpatient Hospital Stay (HOSPITAL_BASED_OUTPATIENT_CLINIC_OR_DEPARTMENT_OTHER): Payer: Medicare Other | Admitting: Hematology

## 2021-02-08 ENCOUNTER — Other Ambulatory Visit: Payer: Self-pay | Admitting: Hematology

## 2021-02-08 ENCOUNTER — Inpatient Hospital Stay: Payer: Medicare Other

## 2021-02-08 VITALS — BP 117/50 | HR 77 | Temp 97.6°F | Resp 18 | Wt 187.0 lb

## 2021-02-08 VITALS — BP 134/62 | HR 77 | Resp 16

## 2021-02-08 DIAGNOSIS — Z8673 Personal history of transient ischemic attack (TIA), and cerebral infarction without residual deficits: Secondary | ICD-10-CM | POA: Insufficient documentation

## 2021-02-08 DIAGNOSIS — Z5112 Encounter for antineoplastic immunotherapy: Secondary | ICD-10-CM | POA: Insufficient documentation

## 2021-02-08 DIAGNOSIS — C9000 Multiple myeloma not having achieved remission: Secondary | ICD-10-CM | POA: Diagnosis not present

## 2021-02-08 DIAGNOSIS — Z923 Personal history of irradiation: Secondary | ICD-10-CM | POA: Insufficient documentation

## 2021-02-08 DIAGNOSIS — E1140 Type 2 diabetes mellitus with diabetic neuropathy, unspecified: Secondary | ICD-10-CM | POA: Insufficient documentation

## 2021-02-08 DIAGNOSIS — N189 Chronic kidney disease, unspecified: Secondary | ICD-10-CM | POA: Diagnosis not present

## 2021-02-08 DIAGNOSIS — D509 Iron deficiency anemia, unspecified: Secondary | ICD-10-CM

## 2021-02-08 DIAGNOSIS — M542 Cervicalgia: Secondary | ICD-10-CM | POA: Diagnosis not present

## 2021-02-08 DIAGNOSIS — I252 Old myocardial infarction: Secondary | ICD-10-CM | POA: Diagnosis not present

## 2021-02-08 DIAGNOSIS — E538 Deficiency of other specified B group vitamins: Secondary | ICD-10-CM | POA: Insufficient documentation

## 2021-02-08 DIAGNOSIS — Z853 Personal history of malignant neoplasm of breast: Secondary | ICD-10-CM | POA: Diagnosis not present

## 2021-02-08 DIAGNOSIS — C7951 Secondary malignant neoplasm of bone: Secondary | ICD-10-CM

## 2021-02-08 DIAGNOSIS — E1122 Type 2 diabetes mellitus with diabetic chronic kidney disease: Secondary | ICD-10-CM | POA: Diagnosis not present

## 2021-02-08 DIAGNOSIS — Z87891 Personal history of nicotine dependence: Secondary | ICD-10-CM | POA: Diagnosis not present

## 2021-02-08 DIAGNOSIS — Z95828 Presence of other vascular implants and grafts: Secondary | ICD-10-CM

## 2021-02-08 DIAGNOSIS — Z7189 Other specified counseling: Secondary | ICD-10-CM

## 2021-02-08 LAB — CMP (CANCER CENTER ONLY)
ALT: 11 U/L (ref 0–44)
AST: 12 U/L — ABNORMAL LOW (ref 15–41)
Albumin: 3.3 g/dL — ABNORMAL LOW (ref 3.5–5.0)
Alkaline Phosphatase: 72 U/L (ref 38–126)
Anion gap: 12 (ref 5–15)
BUN: 17 mg/dL (ref 8–23)
CO2: 21 mmol/L — ABNORMAL LOW (ref 22–32)
Calcium: 9.2 mg/dL (ref 8.9–10.3)
Chloride: 109 mmol/L (ref 98–111)
Creatinine: 1.61 mg/dL — ABNORMAL HIGH (ref 0.44–1.00)
GFR, Estimated: 33 mL/min — ABNORMAL LOW (ref >60)
Glucose, Bld: 105 mg/dL — ABNORMAL HIGH (ref 70–99)
Potassium: 4.7 mmol/L (ref 3.5–5.1)
Sodium: 142 mmol/L (ref 135–145)
Total Bilirubin: <0.2 mg/dL — ABNORMAL LOW (ref 0.3–1.2)
Total Protein: 6.5 g/dL (ref 6.5–8.1)

## 2021-02-08 LAB — CBC WITH DIFFERENTIAL/PLATELET
Abs Immature Granulocytes: 0.01 10*3/uL (ref 0.00–0.07)
Basophils Absolute: 0 10*3/uL (ref 0.0–0.1)
Basophils Relative: 0 %
Eosinophils Absolute: 0.1 10*3/uL (ref 0.0–0.5)
Eosinophils Relative: 2 %
HCT: 26.9 % — ABNORMAL LOW (ref 36.0–46.0)
Hemoglobin: 9.3 g/dL — ABNORMAL LOW (ref 12.0–15.0)
Immature Granulocytes: 0 %
Lymphocytes Relative: 31 %
Lymphs Abs: 1.4 10*3/uL (ref 0.7–4.0)
MCH: 32.3 pg (ref 26.0–34.0)
MCHC: 34.6 g/dL (ref 30.0–36.0)
MCV: 93.4 fL (ref 80.0–100.0)
Monocytes Absolute: 0.4 10*3/uL (ref 0.1–1.0)
Monocytes Relative: 8 %
Neutro Abs: 2.7 10*3/uL (ref 1.7–7.7)
Neutrophils Relative %: 59 %
Platelets: 245 10*3/uL (ref 150–400)
RBC: 2.88 MIL/uL — ABNORMAL LOW (ref 3.87–5.11)
RDW: 12.8 % (ref 11.5–15.5)
WBC: 4.6 10*3/uL (ref 4.0–10.5)
nRBC: 0 % (ref 0.0–0.2)

## 2021-02-08 MED ORDER — SODIUM CHLORIDE 0.9 % IV SOLN
16.0000 mg/kg | Freq: Once | INTRAVENOUS | Status: AC
  Administered 2021-02-08: 1400 mg via INTRAVENOUS
  Filled 2021-02-08: qty 60

## 2021-02-08 MED ORDER — FAMOTIDINE 20 MG IN NS 100 ML IVPB
20.0000 mg | Freq: Once | INTRAVENOUS | Status: AC
  Administered 2021-02-08: 20 mg via INTRAVENOUS

## 2021-02-08 MED ORDER — SODIUM CHLORIDE 0.9 % IV SOLN
Freq: Once | INTRAVENOUS | Status: AC
  Filled 2021-02-08: qty 250

## 2021-02-08 MED ORDER — ACETAMINOPHEN 325 MG PO TABS
650.0000 mg | ORAL_TABLET | Freq: Once | ORAL | Status: AC
  Administered 2021-02-08: 650 mg via ORAL

## 2021-02-08 MED ORDER — ACETAMINOPHEN 325 MG PO TABS
ORAL_TABLET | ORAL | Status: AC
  Filled 2021-02-08: qty 2

## 2021-02-08 MED ORDER — DIPHENHYDRAMINE HCL 25 MG PO CAPS
50.0000 mg | ORAL_CAPSULE | Freq: Once | ORAL | Status: AC
Start: 2021-02-08 — End: 2021-02-08
  Administered 2021-02-08: 50 mg via ORAL

## 2021-02-08 MED ORDER — FAMOTIDINE 20 MG IN NS 100 ML IVPB
INTRAVENOUS | Status: AC
  Filled 2021-02-08: qty 100

## 2021-02-08 MED ORDER — SODIUM CHLORIDE 0.9% FLUSH
10.0000 mL | Freq: Once | INTRAVENOUS | Status: AC
  Administered 2021-02-08: 10 mL
  Filled 2021-02-08: qty 10

## 2021-02-08 MED ORDER — DIPHENHYDRAMINE HCL 25 MG PO CAPS
ORAL_CAPSULE | ORAL | Status: AC
  Filled 2021-02-08: qty 2

## 2021-02-08 MED ORDER — SODIUM CHLORIDE 0.9 % IV SOLN
20.0000 mg | Freq: Once | INTRAVENOUS | Status: AC
  Administered 2021-02-08: 20 mg via INTRAVENOUS
  Filled 2021-02-08: qty 20

## 2021-02-08 MED ORDER — CYANOCOBALAMIN 1000 MCG/ML IJ SOLN
1000.0000 ug | Freq: Once | INTRAMUSCULAR | Status: AC
  Administered 2021-02-08: 1000 ug via INTRAMUSCULAR

## 2021-02-08 MED ORDER — CYANOCOBALAMIN 1000 MCG/ML IJ SOLN
INTRAMUSCULAR | Status: AC
  Filled 2021-02-08: qty 1

## 2021-02-08 NOTE — Patient Instructions (Signed)
[facility] MEDICAL ONCOLOGY   Discharge Instructions: Thank you for choosing [facility] to provide your oncology and hematology care.   If you have a lab appointment with the Cancer Center, please go directly to the Cancer Center and check in at the registration area.   Wear comfortable clothing and clothing appropriate for easy access to any Portacath or PICC line.   We strive to give you quality time with your provider. You may need to reschedule your appointment if you arrive late (15 or more minutes).  Arriving late affects you and other patients whose appointments are after yours.  Also, if you miss three or more appointments without notifying the office, you may be dismissed from the clinic at the provider's discretion.      For prescription refill requests, have your pharmacy contact our office and allow 72 hours for refills to be completed.    Today you received the following chemotherapy and/or immunotherapy agents: daratumumab      To help prevent nausea and vomiting after your treatment, we encourage you to take your nausea medication as directed.  BELOW ARE SYMPTOMS THAT SHOULD BE REPORTED IMMEDIATELY: *FEVER GREATER THAN 100.4 F (38 C) OR HIGHER *CHILLS OR SWEATING *NAUSEA AND VOMITING THAT IS NOT CONTROLLED WITH YOUR NAUSEA MEDICATION *UNUSUAL SHORTNESS OF BREATH *UNUSUAL BRUISING OR BLEEDING *URINARY PROBLEMS (pain or burning when urinating, or frequent urination) *BOWEL PROBLEMS (unusual diarrhea, constipation, pain near the anus) TENDERNESS IN MOUTH AND THROAT WITH OR WITHOUT PRESENCE OF ULCERS (sore throat, sores in mouth, or a toothache) UNUSUAL RASH, SWELLING OR PAIN  UNUSUAL VAGINAL DISCHARGE OR ITCHING   Items with * indicate a potential emergency and should be followed up as soon as possible or go to the Emergency Department if any problems should occur.  Please show the CHEMOTHERAPY ALERT CARD or IMMUNOTHERAPY ALERT CARD at check-in  to the Emergency Department and triage nurse.  Should you have questions after your visit or need to cancel or reschedule your appointment, please contact [facility]ICAL ONCOLOG[facility]  Dept: [phone]  and follow the prompts.  Office hours are 8:00 a.m. to 4:30 p.m. Monday - Friday. Please note that voicemails left after 4:00 p.m. may not be returned until the following business day.  We are closed weekends and major holidays. You have access to a nurse at all times for urgent questions. Please call the main number to the clinic Dept: [phone] and follow the prompts.   For any non-urgent questions, you may also contact your provider using MyChart. We now offer e-Visits for anyone 18 and older to request care online for non-urgent symptoms. For details visit [email or web address].   Also download the MyChart app! Go to the app store, search "MyChart", open the app, select , and log in with your MyChart username and password.  Due to Covid, a mask is required upon entering the hospital/clinic. If you do not have a mask, one will be given to you upon arrival. For doctor visits, patients may have 1 support person aged 18 or older with them. For treatment visits, patients cannot have anyone with them due to current Covid guidelines and our immunocompromised population.   

## 2021-02-12 ENCOUNTER — Telehealth: Payer: Self-pay | Admitting: Hematology

## 2021-02-12 NOTE — Telephone Encounter (Signed)
Scheduled follow-up appointments per 7/14 los. Patient is aware. 

## 2021-02-13 LAB — MULTIPLE MYELOMA PANEL, SERUM
Albumin SerPl Elph-Mcnc: 3.6 g/dL (ref 2.9–4.4)
Albumin/Glob SerPl: 1.4 (ref 0.7–1.7)
Alpha 1: 0.2 g/dL (ref 0.0–0.4)
Alpha2 Glob SerPl Elph-Mcnc: 1.4 g/dL — ABNORMAL HIGH (ref 0.4–1.0)
B-Globulin SerPl Elph-Mcnc: 0.8 g/dL (ref 0.7–1.3)
Gamma Glob SerPl Elph-Mcnc: 0.3 g/dL — ABNORMAL LOW (ref 0.4–1.8)
Globulin, Total: 2.6 g/dL (ref 2.2–3.9)
IgA: 52 mg/dL — ABNORMAL LOW (ref 64–422)
IgG (Immunoglobin G), Serum: 434 mg/dL — ABNORMAL LOW (ref 586–1602)
IgM (Immunoglobulin M), Srm: 35 mg/dL (ref 26–217)
M Protein SerPl Elph-Mcnc: 0.1 g/dL — ABNORMAL HIGH
Total Protein ELP: 6.2 g/dL (ref 6.0–8.5)

## 2021-02-14 ENCOUNTER — Encounter: Payer: Self-pay | Admitting: Hematology

## 2021-02-14 ENCOUNTER — Other Ambulatory Visit: Payer: Self-pay | Admitting: Family Medicine

## 2021-02-15 NOTE — Telephone Encounter (Signed)
Not due until September, patient was given a year supply

## 2021-02-16 ENCOUNTER — Other Ambulatory Visit: Payer: Self-pay

## 2021-02-16 DIAGNOSIS — C9000 Multiple myeloma not having achieved remission: Secondary | ICD-10-CM

## 2021-02-16 DIAGNOSIS — Z7189 Other specified counseling: Secondary | ICD-10-CM

## 2021-02-16 MED ORDER — DEXAMETHASONE 4 MG PO TABS: ORAL_TABLET | ORAL | 4 refills | Status: DC

## 2021-02-20 ENCOUNTER — Other Ambulatory Visit: Payer: Self-pay

## 2021-02-20 DIAGNOSIS — C9000 Multiple myeloma not having achieved remission: Secondary | ICD-10-CM

## 2021-02-20 DIAGNOSIS — Z7189 Other specified counseling: Secondary | ICD-10-CM

## 2021-02-20 MED ORDER — DEXAMETHASONE 4 MG PO TABS: ORAL_TABLET | ORAL | 1 refills | Status: DC

## 2021-02-22 ENCOUNTER — Inpatient Hospital Stay: Payer: Medicare Other

## 2021-02-22 ENCOUNTER — Other Ambulatory Visit: Payer: Self-pay

## 2021-02-22 VITALS — BP 107/46 | HR 79 | Temp 97.7°F | Resp 16

## 2021-02-22 DIAGNOSIS — E1140 Type 2 diabetes mellitus with diabetic neuropathy, unspecified: Secondary | ICD-10-CM | POA: Diagnosis not present

## 2021-02-22 DIAGNOSIS — I252 Old myocardial infarction: Secondary | ICD-10-CM | POA: Diagnosis not present

## 2021-02-22 DIAGNOSIS — Z7189 Other specified counseling: Secondary | ICD-10-CM

## 2021-02-22 DIAGNOSIS — Z5112 Encounter for antineoplastic immunotherapy: Secondary | ICD-10-CM | POA: Diagnosis not present

## 2021-02-22 DIAGNOSIS — C9000 Multiple myeloma not having achieved remission: Secondary | ICD-10-CM | POA: Diagnosis not present

## 2021-02-22 DIAGNOSIS — Z923 Personal history of irradiation: Secondary | ICD-10-CM | POA: Diagnosis not present

## 2021-02-22 DIAGNOSIS — C7951 Secondary malignant neoplasm of bone: Secondary | ICD-10-CM

## 2021-02-22 DIAGNOSIS — Z87891 Personal history of nicotine dependence: Secondary | ICD-10-CM | POA: Diagnosis not present

## 2021-02-22 DIAGNOSIS — N189 Chronic kidney disease, unspecified: Secondary | ICD-10-CM | POA: Diagnosis not present

## 2021-02-22 DIAGNOSIS — E1122 Type 2 diabetes mellitus with diabetic chronic kidney disease: Secondary | ICD-10-CM | POA: Diagnosis not present

## 2021-02-22 DIAGNOSIS — E538 Deficiency of other specified B group vitamins: Secondary | ICD-10-CM | POA: Diagnosis not present

## 2021-02-22 DIAGNOSIS — Z8673 Personal history of transient ischemic attack (TIA), and cerebral infarction without residual deficits: Secondary | ICD-10-CM | POA: Diagnosis not present

## 2021-02-22 DIAGNOSIS — Z853 Personal history of malignant neoplasm of breast: Secondary | ICD-10-CM | POA: Diagnosis not present

## 2021-02-22 LAB — CMP (CANCER CENTER ONLY)
ALT: 13 U/L (ref 0–44)
AST: 15 U/L (ref 15–41)
Albumin: 3.3 g/dL — ABNORMAL LOW (ref 3.5–5.0)
Alkaline Phosphatase: 72 U/L (ref 38–126)
Anion gap: 9 (ref 5–15)
BUN: 22 mg/dL (ref 8–23)
CO2: 22 mmol/L (ref 22–32)
Calcium: 9.3 mg/dL (ref 8.9–10.3)
Chloride: 110 mmol/L (ref 98–111)
Creatinine: 1.62 mg/dL — ABNORMAL HIGH (ref 0.44–1.00)
GFR, Estimated: 33 mL/min — ABNORMAL LOW (ref >60)
Glucose, Bld: 143 mg/dL — ABNORMAL HIGH (ref 70–99)
Potassium: 4.3 mmol/L (ref 3.5–5.1)
Sodium: 141 mmol/L (ref 135–145)
Total Bilirubin: 0.2 mg/dL — ABNORMAL LOW (ref 0.3–1.2)
Total Protein: 6.4 g/dL — ABNORMAL LOW (ref 6.5–8.1)

## 2021-02-22 LAB — CBC WITH DIFFERENTIAL/PLATELET
Abs Immature Granulocytes: 0.01 10*3/uL (ref 0.00–0.07)
Basophils Absolute: 0 10*3/uL (ref 0.0–0.1)
Basophils Relative: 0 %
Eosinophils Absolute: 0.1 10*3/uL (ref 0.0–0.5)
Eosinophils Relative: 2 %
HCT: 26.4 % — ABNORMAL LOW (ref 36.0–46.0)
Hemoglobin: 8.9 g/dL — ABNORMAL LOW (ref 12.0–15.0)
Immature Granulocytes: 0 %
Lymphocytes Relative: 29 %
Lymphs Abs: 1.4 10*3/uL (ref 0.7–4.0)
MCH: 32 pg (ref 26.0–34.0)
MCHC: 33.7 g/dL (ref 30.0–36.0)
MCV: 95 fL (ref 80.0–100.0)
Monocytes Absolute: 0.3 10*3/uL (ref 0.1–1.0)
Monocytes Relative: 7 %
Neutro Abs: 2.9 10*3/uL (ref 1.7–7.7)
Neutrophils Relative %: 62 %
Platelets: 230 10*3/uL (ref 150–400)
RBC: 2.78 MIL/uL — ABNORMAL LOW (ref 3.87–5.11)
RDW: 13.2 % (ref 11.5–15.5)
WBC: 4.7 10*3/uL (ref 4.0–10.5)
nRBC: 0 % (ref 0.0–0.2)

## 2021-02-22 MED ORDER — SODIUM CHLORIDE 0.9 % IV SOLN
Freq: Once | INTRAVENOUS | Status: DC
  Filled 2021-02-22: qty 250

## 2021-02-22 MED ORDER — SODIUM CHLORIDE 0.9 % IV SOLN
Freq: Once | INTRAVENOUS | Status: AC
  Filled 2021-02-22: qty 250

## 2021-02-22 MED ORDER — DIPHENHYDRAMINE HCL 25 MG PO CAPS
ORAL_CAPSULE | ORAL | Status: AC
  Filled 2021-02-22: qty 2

## 2021-02-22 MED ORDER — FAMOTIDINE 20 MG IN NS 100 ML IVPB
INTRAVENOUS | Status: AC
  Filled 2021-02-22: qty 100

## 2021-02-22 MED ORDER — DIPHENHYDRAMINE HCL 50 MG/ML IJ SOLN
INTRAMUSCULAR | Status: AC
  Filled 2021-02-22: qty 1

## 2021-02-22 MED ORDER — SODIUM CHLORIDE 0.9% FLUSH
10.0000 mL | INTRAVENOUS | Status: DC | PRN
  Administered 2021-02-22: 10 mL
  Filled 2021-02-22: qty 10

## 2021-02-22 MED ORDER — CYANOCOBALAMIN 1000 MCG/ML IJ SOLN: 1000.0000 ug | Freq: Once | INTRAMUSCULAR | Status: DC

## 2021-02-22 MED ORDER — SODIUM CHLORIDE 0.9 % IV SOLN
20.0000 mg | Freq: Once | INTRAVENOUS | Status: AC
  Administered 2021-02-22: 20 mg via INTRAVENOUS
  Filled 2021-02-22: qty 20

## 2021-02-22 MED ORDER — FAMOTIDINE 20 MG IN NS 100 ML IVPB
20.0000 mg | Freq: Once | INTRAVENOUS | Status: AC
  Administered 2021-02-22: 20 mg via INTRAVENOUS

## 2021-02-22 MED ORDER — CYANOCOBALAMIN 1000 MCG/ML IJ SOLN
INTRAMUSCULAR | Status: AC
  Filled 2021-02-22: qty 1

## 2021-02-22 MED ORDER — SODIUM CHLORIDE 0.9 % IV SOLN
16.0000 mg/kg | Freq: Once | INTRAVENOUS | Status: AC
  Administered 2021-02-22: 1400 mg via INTRAVENOUS
  Filled 2021-02-22: qty 60

## 2021-02-22 MED ORDER — HEPARIN SOD (PORK) LOCK FLUSH 100 UNIT/ML IV SOLN
500.0000 [IU] | Freq: Once | INTRAVENOUS | Status: AC | PRN
  Administered 2021-02-22: 500 [IU]
  Filled 2021-02-22: qty 5

## 2021-02-22 MED ORDER — ACETAMINOPHEN 325 MG PO TABS
650.0000 mg | ORAL_TABLET | Freq: Once | ORAL | Status: AC
  Administered 2021-02-22: 650 mg via ORAL

## 2021-02-22 MED ORDER — DIPHENHYDRAMINE HCL 25 MG PO CAPS
50.0000 mg | ORAL_CAPSULE | Freq: Once | ORAL | Status: AC
  Administered 2021-02-22: 50 mg via ORAL

## 2021-02-22 MED ORDER — ACETAMINOPHEN 325 MG PO TABS
ORAL_TABLET | ORAL | Status: AC
  Filled 2021-02-22: qty 2

## 2021-02-22 NOTE — Patient Instructions (Signed)
[facility]  Discharge Instructions: °Thank you for choosing [facility] to provide your oncology and hematology care.  ° °If you have a lab appointment with the Cancer Center, please go directly to the Cancer Center and check in at the registration area. °  °Wear comfortable clothing and clothing appropriate for easy access to any Portacath or PICC line.  ° °We strive to give you quality time with your provider. You may need to reschedule your appointment if you arrive late (15 or more minutes).  Arriving late affects you and other patients whose appointments are after yours.  Also, if you miss three or more appointments without notifying the office, you may be dismissed from the clinic at the provider’s discretion.    °  °For prescription refill requests, have your pharmacy contact our office and allow 72 hours for refills to be completed.   ° °Today you received the following chemotherapy and/or immunotherapy agents: Darzalex  °  °To help prevent nausea and vomiting after your treatment, we encourage you to take your nausea medication as directed. ° °BELOW ARE SYMPTOMS THAT SHOULD BE REPORTED IMMEDIATELY: °*FEVER GREATER THAN 100.4 F (38 °C) OR HIGHER °*CHILLS OR SWEATING °*NAUSEA AND VOMITING THAT IS NOT CONTROLLED WITH YOUR NAUSEA MEDICATION °*UNUSUAL SHORTNESS OF BREATH °*UNUSUAL BRUISING OR BLEEDING °*URINARY PROBLEMS (pain or burning when urinating, or frequent urination) °*BOWEL PROBLEMS (unusual diarrhea, constipation, pain near the anus) °TENDERNESS IN MOUTH AND THROAT WITH OR WITHOUT PRESENCE OF ULCERS (sore throat, sores in mouth, or a toothache) °UNUSUAL RASH, SWELLING OR PAIN  °UNUSUAL VAGINAL DISCHARGE OR ITCHING  ° °Items with * indicate a potential emergency and should be followed up as soon as possible or go to the Emergency Department if any problems should occur. ° °Please show the CHEMOTHERAPY ALERT CARD or IMMUNOTHERAPY ALERT CARD at check-in to the  Emergency Department and triage nurse. ° °Should you have questions after your visit or need to cancel or reschedule your appointment, please contact [facility]  Dept: [phone]  and follow the prompts.  Office hours are 8:00 a.m. to 4:30 p.m. Monday - Friday. Please note that voicemails left after 4:00 p.m. may not be returned until the following business day.  We are closed weekends and major holidays. You have access to a nurse at all times for urgent questions. Please call the main number to the clinic Dept: [phone] and follow the prompts. ° ° °For any non-urgent questions, you may also contact your provider using MyChart. We now offer e-Visits for anyone 18 and older to request care online for non-urgent symptoms. For details visit [email or web address]. °  °Also download the MyChart app! Go to the app store, search "MyChart", open the app, select [place], and log in with your MyChart username and password. ° °Due to Covid, a mask is required upon entering the hospital/clinic. If you do not have a mask, one will be given to you upon arrival. For doctor visits, patients may have 1 support person aged 18 or older with them. For treatment visits, patients cannot have anyone with them due to current Covid guidelines and our immunocompromised population.  ° °

## 2021-02-26 LAB — MULTIPLE MYELOMA PANEL, SERUM
Albumin SerPl Elph-Mcnc: 3.3 g/dL (ref 2.9–4.4)
Albumin/Glob SerPl: 1.3 (ref 0.7–1.7)
Alpha 1: 0.2 g/dL (ref 0.0–0.4)
Alpha2 Glob SerPl Elph-Mcnc: 1.2 g/dL — ABNORMAL HIGH (ref 0.4–1.0)
B-Globulin SerPl Elph-Mcnc: 0.9 g/dL (ref 0.7–1.3)
Gamma Glob SerPl Elph-Mcnc: 0.3 g/dL — ABNORMAL LOW (ref 0.4–1.8)
Globulin, Total: 2.6 g/dL (ref 2.2–3.9)
IgA: 51 mg/dL — ABNORMAL LOW (ref 64–422)
IgG (Immunoglobin G), Serum: 388 mg/dL — ABNORMAL LOW (ref 586–1602)
IgM (Immunoglobulin M), Srm: 31 mg/dL (ref 26–217)
M Protein SerPl Elph-Mcnc: 0.2 g/dL — ABNORMAL HIGH
Total Protein ELP: 5.9 g/dL — ABNORMAL LOW (ref 6.0–8.5)

## 2021-03-04 NOTE — Telephone Encounter (Signed)
No additional information

## 2021-03-06 ENCOUNTER — Telehealth: Payer: Self-pay

## 2021-03-06 ENCOUNTER — Other Ambulatory Visit: Payer: Self-pay | Admitting: Hematology

## 2021-03-06 NOTE — Progress Notes (Signed)
Chronic Care Management Pharmacy Assistant   Name: Tracy [name]  MRN: [id number] DOB: 12-10-1945   Reason for Encounter: Disease State - Hypertension    Recent office visits:  None noted.   Recent consult visits:  02/08/21 Tracy Lone, MD - Oncology - Multiple Myeloma - VAS [place] UPPER EXTREMITY VENOUS DUPLEX ordered.  Held  Zometa due to kidney numbers. Will consider switch to Aredia and change to every 2 months, not monthly. Follow up in 4 weeks.   01/24/21 Tracy Elk, MD - Gastroenterology - Iron deficiency anemia - Small bowel enteroscopy ordered. No medication changes. Proceed with Enteroscopy as scheduled.    Hospital visits:  None in previous 6 months  Medications: Outpatient Encounter Medications as of 03/06/2021  Medication Sig Note   Accu-Chek Softclix Lancets lancets Use as instructed to check sugars 1-2 times daily.    acyclovir (ZOVIRAX) 400 MG tablet Take 1 tablet (400 mg total) by mouth 2 (two) times daily.    albuterol (VENTOLIN HFA) 108 (90 Base) MCG/ACT inhaler Inhale 2 puffs into the lungs every 6 (six) hours as needed for wheezing or shortness of breath.    atorvastatin (LIPITOR) 10 MG tablet TAKE 1 TABLET BY MOUTH  DAILY    dexamethasone (DECADRON) 2 MG tablet Take 3 tabs (646m) po daily x3 days and then 2 tabs (473m po daily x3 days, and then 1 tab (46m34mpo daily x3 days    dexamethasone (DECADRON) 4 MG tablet Take 3 tabs with breakfast the day after each treatment.    doxycycline (VIBRA-TABS) 100 MG tablet Take 1 tablet (100 mg total) by mouth 2 (two) times daily.    fluticasone (FLOVENT HFA) 110 MCG/ACT inhaler Inhale 2 puffs into the lungs in the morning and at bedtime.    glucose blood (ACCU-CHEK GUIDE) test strip Use as instructed to check sugars 1-2 times daily.    lidocaine-prilocaine (EMLA) cream APPLY 1 APPLICATION TOPICALLY AS NEEDED.    loratadine (CLARITIN) 10 MG tablet Take 10 mg by mouth daily.    losartan (COZAAR) 100 MG tablet TAKE 1 TABLET  BY MOUTH  DAILY    metFORMIN (GLUCOPHAGE) 500 MG tablet TAKE 1 TABLET(500 MG) BY MOUTH TWICE DAILY WITH A MEAL    mometasone (NASONEX) 50 MCG/ACT nasal spray Place 2 sprays into the nose daily. (Patient taking differently: Place 2 sprays into the nose as needed.) 08/29/2020: Changed to as needed   ondansetron (ZOFRAN) 8 MG tablet Take 1 tablet (8 mg total) by mouth 2 (two) times daily as needed (Nausea or vomiting).    pantoprazole (PROTONIX) 40 MG tablet TAKE 1 TABLET BY MOUTH  TWICE DAILY    potassium chloride SA (KLOR-CON) 20 MEQ tablet TAKE 1 TABLET(20 MEQ) BY MOUTH TWICE DAILY    pregabalin (LYRICA) 50 MG capsule TAKE 1 CAPSULE(50 MG) BY MOUTH THREE TIMES DAILY    prochlorperazine (COMPAZINE) 10 MG tablet Take 1 tablet (10 mg total) by mouth every 6 (six) hours as needed (Nausea or vomiting).    tiZANidine (ZANAFLEX) 4 MG tablet TAKE 1 TABLET(4 MG) BY MOUTH AT BEDTIME    traZODone (DESYREL) 100 MG tablet TAKE 1 TABLET BY MOUTH AT  BEDTIME    Vitamin D, Ergocalciferol, (DRISDOL) 1.25 MG (50000 UNIT) CAPS capsule TAKE 1 CAPSULE BY MOUTH 1 TIME A WEEK    No facility-administered encounter medications on file as of 03/06/2021.    Current antihypertensive regimen:  Losartan 100 mg daily (patient reports she has been holding today  and instructed to take 1/2 tablet yesterday at Oncology die to low blood pressure)  How often are you checking your Blood Pressure?  Patient reported she is checking her more recently since her drop in blood pressure. Before this week she would only check it as needed.    Current home BP readings: Patient reports her blood pressure was low at her treatment yesterday. This morning her blood pressure was 154/72 but patient has just gotten out of the bathroom and walking around. She had not taken blood pressure medicine today. She will check it again this afternoon after she returns home from running errands and has been relaxing at least 15 minutes prior.    What recent  interventions/DTPs have been made by any provider to improve Blood Pressure control since last [name] Visit:  She as told yesterday to take 1/2 Losartan 100 mg (50 mg total) while her blood pressure was running low. She is currently keeping a BP log for Oncology for possible adjustment of medication if warranted.     Any recent hospitalizations or ED visits since last visit with [name]?  Patient has not had any hospitalizations or ED visits since last visit with [name].   What diet changes have been made to improve Blood Pressure Control?  Patient reported she is watching her salt intake.    What exercise is being done to improve your Blood Pressure Control?  Patient reported she is active as she is able. She is currently receiving treatments at the Oncologist and someday's she is more fatigued.   Adherence Review: Is the patient currently on ACE/ARB medication? Yes Does the patient have >5 day gap between last estimated fill dates? No  Losartan 100 mg daily - last filled 12/21/20 90 days   Star Rating Drugs: atorvastatin (LIPITOR) 10 MG tablet - last filled 12/21/20 90 days  losartan (COZAAR) 100 MG tablet - last filled 12/21/20 90 days metFORMIN (GLUCOPHAGE) 500 MG tablet - last filled 12/13/20 90 days   Future Appointments  Date Time Provider [place]  04/04/2021  2:45 PM [facility] None  04/04/2021  3:20 PM [name], MD [facility] None  04/05/2021 10:00 AM [facility] INFUSION [facility] None  05/31/2021 10:00 AM [facility] INFUSION [facility] None  07/26/2021 10:00 AM [facility] INFUSION [facility] None  09/20/2021 10:00 AM [facility] INFUSION [facility] None     [name], CCMA Clinical Pharmacist Assistant  [phone]  Time Spent: 40 minutes

## 2021-03-07 ENCOUNTER — Telehealth: Payer: Self-pay | Admitting: Hematology

## 2021-03-07 NOTE — Telephone Encounter (Signed)
Rescheduled upcoming appointment due to provider's emergency. Patient is aware of changes. 

## 2021-03-08 ENCOUNTER — Inpatient Hospital Stay: Payer: Medicare Other | Admitting: Hematology

## 2021-03-08 ENCOUNTER — Inpatient Hospital Stay: Payer: Medicare Other

## 2021-03-08 ENCOUNTER — Inpatient Hospital Stay (HOSPITAL_BASED_OUTPATIENT_CLINIC_OR_DEPARTMENT_OTHER): Payer: Medicare Other | Admitting: Adult Health

## 2021-03-08 ENCOUNTER — Inpatient Hospital Stay: Payer: Medicare Other | Attending: Hematology

## 2021-03-08 ENCOUNTER — Encounter: Payer: Self-pay | Admitting: Adult Health

## 2021-03-08 ENCOUNTER — Other Ambulatory Visit: Payer: Self-pay

## 2021-03-08 VITALS — BP 133/54 | HR 86 | Temp 97.7°F | Resp 18 | Ht 60.0 in | Wt 188.8 lb

## 2021-03-08 VITALS — BP 115/48 | HR 78 | Temp 98.5°F | Resp 18

## 2021-03-08 DIAGNOSIS — Z87891 Personal history of nicotine dependence: Secondary | ICD-10-CM | POA: Insufficient documentation

## 2021-03-08 DIAGNOSIS — Z8673 Personal history of transient ischemic attack (TIA), and cerebral infarction without residual deficits: Secondary | ICD-10-CM | POA: Diagnosis not present

## 2021-03-08 DIAGNOSIS — Z5112 Encounter for antineoplastic immunotherapy: Secondary | ICD-10-CM

## 2021-03-08 DIAGNOSIS — C9000 Multiple myeloma not having achieved remission: Secondary | ICD-10-CM

## 2021-03-08 DIAGNOSIS — E538 Deficiency of other specified B group vitamins: Secondary | ICD-10-CM | POA: Insufficient documentation

## 2021-03-08 DIAGNOSIS — E1122 Type 2 diabetes mellitus with diabetic chronic kidney disease: Secondary | ICD-10-CM | POA: Insufficient documentation

## 2021-03-08 DIAGNOSIS — Z923 Personal history of irradiation: Secondary | ICD-10-CM | POA: Diagnosis not present

## 2021-03-08 DIAGNOSIS — R42 Dizziness and giddiness: Secondary | ICD-10-CM | POA: Diagnosis not present

## 2021-03-08 DIAGNOSIS — N189 Chronic kidney disease, unspecified: Secondary | ICD-10-CM | POA: Diagnosis not present

## 2021-03-08 DIAGNOSIS — Z79899 Other long term (current) drug therapy: Secondary | ICD-10-CM | POA: Diagnosis not present

## 2021-03-08 DIAGNOSIS — E1140 Type 2 diabetes mellitus with diabetic neuropathy, unspecified: Secondary | ICD-10-CM | POA: Diagnosis not present

## 2021-03-08 DIAGNOSIS — Z853 Personal history of malignant neoplasm of breast: Secondary | ICD-10-CM | POA: Insufficient documentation

## 2021-03-08 DIAGNOSIS — Z95828 Presence of other vascular implants and grafts: Secondary | ICD-10-CM

## 2021-03-08 DIAGNOSIS — I252 Old myocardial infarction: Secondary | ICD-10-CM | POA: Insufficient documentation

## 2021-03-08 DIAGNOSIS — Z7189 Other specified counseling: Secondary | ICD-10-CM

## 2021-03-08 DIAGNOSIS — C7951 Secondary malignant neoplasm of bone: Secondary | ICD-10-CM

## 2021-03-08 LAB — CMP (CANCER CENTER ONLY)
ALT: 16 U/L (ref 0–44)
AST: 12 U/L — ABNORMAL LOW (ref 15–41)
Albumin: 3.3 g/dL — ABNORMAL LOW (ref 3.5–5.0)
Alkaline Phosphatase: 59 U/L (ref 38–126)
Anion gap: 10 (ref 5–15)
BUN: 21 mg/dL (ref 8–23)
CO2: 22 mmol/L (ref 22–32)
Calcium: 9.6 mg/dL (ref 8.9–10.3)
Chloride: 110 mmol/L (ref 98–111)
Creatinine: 1.7 mg/dL — ABNORMAL HIGH (ref 0.44–1.00)
GFR, Estimated: 31 mL/min — ABNORMAL LOW (ref >60)
Glucose, Bld: 121 mg/dL — ABNORMAL HIGH (ref 70–99)
Potassium: 4.6 mmol/L (ref 3.5–5.1)
Sodium: 142 mmol/L (ref 135–145)
Total Bilirubin: 0.3 mg/dL (ref 0.3–1.2)
Total Protein: 6.2 g/dL — ABNORMAL LOW (ref 6.5–8.1)

## 2021-03-08 LAB — CBC WITH DIFFERENTIAL/PLATELET
Abs Immature Granulocytes: 0.02 10*3/uL (ref 0.00–0.07)
Basophils Absolute: 0 10*3/uL (ref 0.0–0.1)
Basophils Relative: 0 %
Eosinophils Absolute: 0.1 10*3/uL (ref 0.0–0.5)
Eosinophils Relative: 1 %
HCT: 26.9 % — ABNORMAL LOW (ref 36.0–46.0)
Hemoglobin: 8.9 g/dL — ABNORMAL LOW (ref 12.0–15.0)
Immature Granulocytes: 0 %
Lymphocytes Relative: 26 %
Lymphs Abs: 1.4 10*3/uL (ref 0.7–4.0)
MCH: 31.3 pg (ref 26.0–34.0)
MCHC: 33.1 g/dL (ref 30.0–36.0)
MCV: 94.7 fL (ref 80.0–100.0)
Monocytes Absolute: 0.4 10*3/uL (ref 0.1–1.0)
Monocytes Relative: 7 %
Neutro Abs: 3.5 10*3/uL (ref 1.7–7.7)
Neutrophils Relative %: 66 %
Platelets: 223 10*3/uL (ref 150–400)
RBC: 2.84 MIL/uL — ABNORMAL LOW (ref 3.87–5.11)
RDW: 14.4 % (ref 11.5–15.5)
WBC: 5.3 10*3/uL (ref 4.0–10.5)
nRBC: 0 % (ref 0.0–0.2)

## 2021-03-08 MED ORDER — CYANOCOBALAMIN 1000 MCG/ML IJ SOLN
1000.0000 ug | Freq: Once | INTRAMUSCULAR | Status: AC
  Administered 2021-03-08: 1000 ug via INTRAMUSCULAR
  Filled 2021-03-08: qty 1

## 2021-03-08 MED ORDER — SODIUM CHLORIDE 0.9% FLUSH
10.0000 mL | INTRAVENOUS | Status: DC | PRN
  Administered 2021-03-08: 10 mL
  Filled 2021-03-08: qty 10

## 2021-03-08 MED ORDER — SODIUM CHLORIDE 0.9 % IV SOLN
20.0000 mg | Freq: Once | INTRAVENOUS | Status: AC
  Administered 2021-03-08: 20 mg via INTRAVENOUS
  Filled 2021-03-08: qty 20

## 2021-03-08 MED ORDER — FAMOTIDINE 20 MG IN NS 100 ML IVPB
20.0000 mg | Freq: Once | INTRAVENOUS | Status: AC
  Administered 2021-03-08: 20 mg via INTRAVENOUS
  Filled 2021-03-08: qty 100

## 2021-03-08 MED ORDER — ACETAMINOPHEN 325 MG PO TABS
650.0000 mg | ORAL_TABLET | Freq: Once | ORAL | Status: AC
  Administered 2021-03-08: 650 mg via ORAL
  Filled 2021-03-08: qty 2

## 2021-03-08 MED ORDER — MECLIZINE HCL 25 MG PO TABS: 25.0000 mg | ORAL_TABLET | Freq: Three times a day (TID) | ORAL | 0 refills | Status: DC | PRN

## 2021-03-08 MED ORDER — SODIUM CHLORIDE 0.9 % IV SOLN
16.0000 mg/kg | Freq: Once | INTRAVENOUS | Status: AC
  Administered 2021-03-08: 1400 mg via INTRAVENOUS
  Filled 2021-03-08: qty 60

## 2021-03-08 MED ORDER — SODIUM CHLORIDE 0.9 % IV SOLN
Freq: Once | INTRAVENOUS | Status: AC
  Filled 2021-03-08: qty 250

## 2021-03-08 MED ORDER — SODIUM CHLORIDE 0.9% FLUSH
10.0000 mL | INTRAVENOUS | Status: AC | PRN
  Administered 2021-03-08: 10 mL
  Filled 2021-03-08: qty 10

## 2021-03-08 MED ORDER — DIPHENHYDRAMINE HCL 25 MG PO CAPS
50.0000 mg | ORAL_CAPSULE | Freq: Once | ORAL | Status: AC
  Administered 2021-03-08: 50 mg via ORAL
  Filled 2021-03-08: qty 2

## 2021-03-08 MED ORDER — HEPARIN SOD (PORK) LOCK FLUSH 100 UNIT/ML IV SOLN
500.0000 [IU] | Freq: Once | INTRAVENOUS | Status: AC | PRN
  Administered 2021-03-08: 500 [IU]
  Filled 2021-03-08: qty 5

## 2021-03-08 NOTE — Patient Instructions (Signed)
[facility]  Discharge Instructions: °Thank you for choosing [facility] to provide your oncology and hematology care.  ° °If you have a lab appointment with the Cancer Center, please go directly to the Cancer Center and check in at the registration area. °  °Wear comfortable clothing and clothing appropriate for easy access to any Portacath or PICC line.  ° °We strive to give you quality time with your provider. You may need to reschedule your appointment if you arrive late (15 or more minutes).  Arriving late affects you and other patients whose appointments are after yours.  Also, if you miss three or more appointments without notifying the office, you may be dismissed from the clinic at the provider’s discretion.    °  °For prescription refill requests, have your pharmacy contact our office and allow 72 hours for refills to be completed.   ° °Today you received the following chemotherapy and/or immunotherapy agents: Darzalex  °  °To help prevent nausea and vomiting after your treatment, we encourage you to take your nausea medication as directed. ° °BELOW ARE SYMPTOMS THAT SHOULD BE REPORTED IMMEDIATELY: °*FEVER GREATER THAN 100.4 F (38 °C) OR HIGHER °*CHILLS OR SWEATING °*NAUSEA AND VOMITING THAT IS NOT CONTROLLED WITH YOUR NAUSEA MEDICATION °*UNUSUAL SHORTNESS OF BREATH °*UNUSUAL BRUISING OR BLEEDING °*URINARY PROBLEMS (pain or burning when urinating, or frequent urination) °*BOWEL PROBLEMS (unusual diarrhea, constipation, pain near the anus) °TENDERNESS IN MOUTH AND THROAT WITH OR WITHOUT PRESENCE OF ULCERS (sore throat, sores in mouth, or a toothache) °UNUSUAL RASH, SWELLING OR PAIN  °UNUSUAL VAGINAL DISCHARGE OR ITCHING  ° °Items with * indicate a potential emergency and should be followed up as soon as possible or go to the Emergency Department if any problems should occur. ° °Please show the CHEMOTHERAPY ALERT CARD or IMMUNOTHERAPY ALERT CARD at check-in to the  Emergency Department and triage nurse. ° °Should you have questions after your visit or need to cancel or reschedule your appointment, please contact [facility]  Dept: [phone]  and follow the prompts.  Office hours are 8:00 a.m. to 4:30 p.m. Monday - Friday. Please note that voicemails left after 4:00 p.m. may not be returned until the following business day.  We are closed weekends and major holidays. You have access to a nurse at all times for urgent questions. Please call the main number to the clinic Dept: [phone] and follow the prompts. ° ° °For any non-urgent questions, you may also contact your provider using MyChart. We now offer e-Visits for anyone 18 and older to request care online for non-urgent symptoms. For details visit [email or web address]. °  °Also download the MyChart app! Go to the app store, search "MyChart", open the app, select [facility], and log in with your MyChart username and password. ° °Due to Covid, a mask is required upon entering the hospital/clinic. If you do not have a mask, one will be given to you upon arrival. For doctor visits, patients may have 1 support person aged 18 or older with them. For treatment visits, patients cannot have anyone with them due to current Covid guidelines and our immunocompromised population.  ° °

## 2021-03-08 NOTE — Assessment & Plan Note (Addendum)
CT abdomen.pelvis on 01/07/20 for a cough and persistent acid reflux showed lytic lesions in the pelvis and L5 vertebral body She underwent a bone marrow biopsy on 01/17/20: 10 to 25% plasma cells  PET scan on 01/20/20 showed hypermetabolism corresponding to left acetabular and L5 lytic lesions  1) Newly diagnosed IgG Kappa Multiple myeloma with bone lesions, anemia, renal insuff. M spike @ 3.7g/dl on diagnosis. 1p deletion, polymorphic variant, 13q deletion  02/11/2020-05/19/2020: daratumumab 02/29/2020-03/13/2020: palliative radiation to myeloma bone lesions 06/02/2020-10/05/2020: Carfilzomib and Daratumumab 10/19/2020: Maintenance Daratumumab  ________________________________________________________________________________________________________  Tracy [name] is here today for follow up of her multiple myeloma on treatment with maitnenance daratumumab.  Her most recent M spike was 0.1.  She is tolerating her treatment well and will proceed with this today and continue it every four weeks.    She has b12 deficiency and I updated her orders so that she will receive her b12 every 4 weeks.   She will continue with Pamidronate which is due again in 4 weeks.  Tracy [name] has some dizziness.  She is going to do the epley maneuver when she returns home.  She is also going to decrease her losartan from 133m daily to 541mdaily.  I sent in meclizine at her request if the epley maneuver and bp med decrease doesn't improve her dizziness and her bp remains stable.    DoSokhnaill return in 4 weeks for labs, f/u and her next treatment.  She knows to call for any questions that may arise between now and her next appointment.  We are happy to see her sooner if needed.

## 2021-03-08 NOTE — Progress Notes (Signed)
[facility] Follow up:    [name], MD [address] [place] [address]   DIAGNOSIS: Cancer Staging History of right breast cancer Staging form: Breast, AJCC 7th Edition - Clinical stage from 06/13/2015: Stage IA (T1b, N0, M0) - Unsigned Laterality: Right - Pathologic stage from 07/10/2015: Stage IA (T1c, N0, cM0) - Unsigned Staged by: Pathologist Laterality: Right Estrogen receptor status: Positive Progesterone receptor status: Positive HER2 status: Negative Stage used in treatment planning: Yes National guidelines used in treatment planning: Yes Type of national guideline used in treatment planning: NCCN Staging comments: Staged on surgical specimen by Dr. Lyndon Code   SUMMARY OF ONCOLOGIC HISTORY: Oncology History  History of right breast cancer  04/16/2000 Surgery   Left breast: Triple negative  invasive ductal cancer treated with lumpectomy, adjuvant chemotherapy, radiation , in [place], unknown stage   06/07/2015 Mammogram   Right breast mass 6x 6 x 5 mm, right axillary lymph node with slight cortex thickening measured 5 mm    06/13/2015 Initial Diagnosis   Right breast needle biopsy: Invasive ductal carcinoma, grade 1, right axillary lymph node biopsy negative , ER 95%, PR 5%, Ki-67 10%, HER-2 negative   06/13/2015 Clinical Stage   Stage IA: T1b N0   07/07/2015 Surgery   Right lumpectomy: Invasive ductal carcinoma grade 1, 1 cm span, with low-grade DCIS, DCIS focally 0.1 cm to inferior margin, 0/3 lymph nodes negative, ER 95%, PR 5%, HER-2 negative ratio 1.1, Ki-67 10%   07/07/2015 Pathologic Stage   Stage IA: T1c N0   07/13/2015 Procedure   Breast High/Moderate Risk Panel reveals no clinically significant variant at ATM, BRCA1, BRCA2, CDH1, CHEK2, PALB2, PTEN, and TP53.     08/23/2015 - 09/21/2015 Radiation Therapy   Adjuvant Radiation: Right breast/ 42.5Gy at 2.5 Gy per fraction x 17 fractions.   Right breast boost/ 7.5 Gy at  2.5 Gy per fraction x 3 fractions    Anti-estrogen oral therapy   Patient refused antiestrogen therapy   10/20/2015 Survivorship   Survivorship care plan completed and mailed to patient in lieu of in person visit at her request   Iron deficiency anemia  Multiple myeloma ([place])  01/25/2020 Initial Diagnosis   Multiple myeloma ([place])   02/11/2020 - 05/19/2020 Adjuvant Chemotherapy   Daratumumab Weekly    02/29/2020 - 03/13/2020 Radiation Therapy   The targets were treated to a total dose of 20 Gy in 10 fractions of 2 Gy each to the left hip and L5 using one plan.   06/02/2020 - 10/05/2020 Adjuvant Chemotherapy   Daratumumab and Carfilzomib   10/19/2020 -  Adjuvant Chemotherapy   Maintenance Daratumumab--initially every 2 weeks, then every 4 weeks.    Bone metastases ([place])  02/08/2021 Initial Diagnosis   Bone metastases (H[facility])     CURRENT THERAPY: Daratumumab/B12 injection, Pamidronate  INTERVAL HISTORY: Kyndal A Wyndham 75 y.o. female returns for evaluation of her multiple myeloma prior to receiving her every 4 week Daratumumab.  She says that she tolerates this well and has no concerns today.    Annalyse also receives b12 injections every 4 weeks, and has no issues with receiving these.    She receives Pamidronate every 8 weeks.  This is not due to day but at her next visit in 4 weeks.   She notes that over the past week she has felt somewhat dizzy.  She says that she has a h/o BPPV and that her it a lot of times  will improve with doing the Epley maneuver her ENT doctor taught her.  She has tried the Epley maneuver and it has not improved.  She wants to know if we can give her some meclizine because sometimes that will help.  Of note, her BP today is decreased, particularly the diastolic bp.  She takes losartan $RemoveBefore'100mg'WvbdKjZYinhgl$  daily.    Emilly says that she is eating well.  She note she does struggle with water intake, and can only get a max of 32 ounces of water in per day.  She denies any weight  loss, or blood sugar concerns today.     Patient Active Problem List   Diagnosis Date Noted   Bone metastases ([place]) 02/08/2021   Angiodysplasia of intestine 08/23/2020   Port-A-Cath in place 08/04/2020   Counseling regarding advance care planning and goals of care 02/07/2020   Multiple myeloma ([place]) 01/25/2020   Dizziness 10/12/2019   Post-nasal drainage 10/12/2019   Allergic rhinitis 08/19/2018   Type 2 diabetes mellitus with diabetic neuropathy, unspecified ([place]) 11/05/2017   Encounter for long-term use of muscle relaxants 09/24/2017   CAD in native artery 09/24/2017   OSA (obstructive sleep apnea) 09/24/2017   [place] 09/24/2017   Asthenia 06/30/2017   Morbid obesity ([place]) 06/02/2017   Depression 06/02/2017   Iron deficiency anemia 09/23/2016   Anemia of chronic disease 09/28/2015   Genetic testing 08/21/2015   History of left breast cancer 07/13/2015   History of right breast cancer 06/20/2015   Hearing loss due to cerumen impaction 12/29/2014   Allergy to adhesive tape 05/19/2014   Hyperlipidemia 12/06/2013   GERD (gastroesophageal reflux disease) 12/06/2013   Cervical disc disease 11/11/2013   Osteopenia 05/25/2013   Allergic asthma 12/24/2012   Insomnia 04/02/2012   Physical exam, annual 04/02/2012   HTN (hypertension) 02/03/2012   Vertigo, benign positional 02/03/2012   Left groin pain 02/03/2012   Hip pain 10/10/2011   TIA (transient ischemic attack) 07/24/2011   Allergic reaction 07/05/2011   Osteoarthrosis involving lower leg 10/19/2010   Disorder of bone and cartilage 05/01/2010   Generalized anxiety disorder 05/01/2010   Vitamin D deficiency 02/20/2010   Unspecified chronic bronchitis ([place]) 08/24/2009   Other lymphedema 10/29/2007    is allergic to bacitracin-neomycin-polymyxin  [neomycin-bacitracin zn-polymyx], nsaids, tape, ambien [zolpidem tartrate], amoxicillin, clavulanic acid, contrast media [iodinated diagnostic agents], latex, and  prednisone.  MEDICAL HISTORY: Past Medical History:  Diagnosis Date   Angiodysplasia of intestine 08/23/2020   Anxiety    Arthritis    Breast cancer ([place]) 06/13/15   Cancer ([place]) 2000   breast cancer   Chronic bronchitis ([place])    Chronic bronchitis ([place])    Hyperlipidemia    Hypertension    Myocardial infarction [facility]) 2001   Personal history of radiation therapy    Restless leg    Stroke ([place]) 2004   TIA, no deficits    SURGICAL HISTORY: Past Surgical History:  Procedure Laterality Date   ABDOMINAL HYSTERECTOMY  1985   BREAST LUMPECTOMY Left 2000   radiation and chemo   BREAST LUMPECTOMY Right 2016   radiation   BREAST SURGERY  2001   lt breast lumpectomy   COLONOSCOPY WITH ESOPHAGOGASTRODUODENOSCOPY (EGD)  04/2020   GIVENS CAPSULE STUDY  07/2020   IR IMAGING GUIDED PORT INSERTION  04/25/2020   RADIOACTIVE SEED GUIDED PARTIAL MASTECTOMY WITH AXILLARY SENTINEL LYMPH NODE BIOPSY Right 07/07/2015   Procedure: RIGHT RADIOACTIVE SEED GUIDED PARTIAL MASTECTOMY WITH AXILLARY SENTINEL LYMPH NODE BIOPSY;  Surgeon: Eddie Dibbles  Daiva Nakayama, MD;  Location: [facility];  Service: General;  Laterality: Right;   SMALL INTESTINE SURGERY     TUBAL LIGATION      SOCIAL HISTORY: Social History   Socioeconomic History   Marital status: Divorced    Spouse name: Not on file   Number of children: 7   Years of education: Not on file   Highest education level: Not on file  Occupational History   Occupation: retired  Tobacco Use   Smoking status: Former    Packs/day: 1.00    Years: 20.00    Pack years: 20.00    Types: Cigarettes    Quit date: 07/30/2011    Years since quitting: 9.6   Smokeless tobacco: Never   Tobacco comments:    Quit >4 years ago; 1 ppd for about 5/20 years (remaining was less)  Vaping Use   Vaping Use: Former  Substance and Sexual Activity   Alcohol use: No    Alcohol/week: 0.0 standard drinks   Drug use: No   Sexual activity: Not Currently  Other Topics  Concern   Not on file  Social History Narrative   Lives alone.  Retired.  Education:  11th grade GED.  Children:  7 (one here).    Social Determinants of Health   Financial Resource Strain: Low Risk    Difficulty of Paying Living Expenses: Not hard at all  Food Insecurity: No Food Insecurity   Worried About Charity fundraiser in the Last Year: Never true   Arboriculturist in the Last Year: Never true  Transportation Needs: Not on file  Physical Activity: Inactive   Days of Exercise per Week: 0 days   Minutes of Exercise per Session: 0 min  Stress: No Stress Concern Present   Feeling of Stress : Not at all  Social Connections: Moderately Isolated   Frequency of Communication with Friends and Family: More than three times a week   Frequency of Social Gatherings with Friends and Family: Once a week   Attends Religious Services: 1 to 4 times per year   Active Member of [employer] or Organizations: No   Attends Music therapist: Never   Marital Status: Divorced  Human resources officer Violence: Not At Risk   Fear of Current or Ex-Partner: No   Emotionally Abused: No   Physically Abused: No   Sexually Abused: No    FAMILY HISTORY: Family History  Problem Relation Age of Onset   Emphysema Mother 75       smoker   Diabetes Father    Lung cancer Sister        dx. <50; former smoker   Diabetes Brother    Diabetes Brother    Brain cancer Brother 6       unknown tumor type   Stroke Maternal Grandmother    Diabetes Paternal Grandmother    Cancer Daughter 11       neck cancer   Other Daughter        hysterectomy for unspecified reason   Colon cancer Daughter    Diabetes Paternal Aunt    Breast cancer Cousin    Cancer Cousin        unspecified type   Breast cancer Other        triple negative breast cancer in her 70s   Colon polyps Neg Hx    Esophageal cancer Neg Hx    Gallbladder disease Neg Hx     Review of  Systems  Constitutional:  Positive for fatigue (mild,  chronic, and stable). Negative for appetite change, chills, fever and unexpected weight change.  HENT:   Negative for hearing loss, lump/mass and trouble swallowing.   Eyes:  Negative for eye problems and icterus.  Respiratory:  Negative for chest tightness, cough and shortness of breath.   Cardiovascular:  Negative for chest pain, leg swelling and palpitations.  Gastrointestinal:  Negative for abdominal distention, abdominal pain, constipation, diarrhea, nausea and vomiting.  Endocrine: Negative for hot flashes.  Genitourinary:  Negative for difficulty urinating.   Musculoskeletal:  Negative for arthralgias.  Skin:  Negative for itching and rash.  Neurological:  Positive for dizziness (see interval history). Negative for extremity weakness, headaches and numbness.  Hematological:  Negative for adenopathy. Does not bruise/bleed easily.  Psychiatric/Behavioral:  Negative for depression. The patient is not nervous/anxious.      PHYSICAL EXAMINATION  ECOG PERFORMANCE STATUS: 1 - Symptomatic but completely ambulatory  Vitals:   03/08/21 1214  BP: (!) 133/54  Pulse: 86  Resp: 18  Temp: 97.7 F (36.5 C)  SpO2: 98%    Physical Exam Constitutional:      General: She is not in acute distress.    Appearance: Normal appearance. She is not toxic-appearing.     Comments: Examined in wheelchair  HENT:     Head: Normocephalic and atraumatic.     Mouth/Throat:     Mouth: Mucous membranes are moist.     Pharynx: No oropharyngeal exudate or posterior oropharyngeal erythema.  Eyes:     General: No scleral icterus. Cardiovascular:     Rate and Rhythm: Normal rate and regular rhythm.     Pulses: Normal pulses.     Heart sounds: Normal heart sounds.  Pulmonary:     Effort: Pulmonary effort is normal.     Breath sounds: Normal breath sounds.  Abdominal:     General: Abdomen is flat. Bowel sounds are normal.     Palpations: Abdomen is soft.  Musculoskeletal:        General: No swelling.      Cervical back: Neck supple.  Lymphadenopathy:     Cervical: No cervical adenopathy.  Skin:    General: Skin is warm and dry.     Capillary Refill: Capillary refill takes less than 2 seconds.     Findings: No rash.  Neurological:     General: No focal deficit present.     Mental Status: She is alert.  Psychiatric:        Mood and Affect: Mood normal.        Behavior: Behavior normal.    LABORATORY DATA:  CBC    Component Value Date/Time   WBC 5.3 03/08/2021 1153   RBC 2.84 (L) 03/08/2021 1153   HGB 8.9 (L) 03/08/2021 1153   HGB 9.0 (L) 03/24/2020 1032   HGB 9.7 (L) 07/13/2015 1516   HCT 26.9 (L) 03/08/2021 1153   HCT 30.2 (L) 07/13/2015 1516   PLT 223 03/08/2021 1153   PLT 192 03/24/2020 1032   PLT 317 07/13/2015 1516   MCV 94.7 03/08/2021 1153   MCV 86.8 07/13/2015 1516   MCH 31.3 03/08/2021 1153   MCHC 33.1 03/08/2021 1153   RDW 14.4 03/08/2021 1153   RDW 14.5 07/13/2015 1516   LYMPHSABS 1.4 03/08/2021 1153   LYMPHSABS 3.7 (H) 07/13/2015 1516   MONOABS 0.4 03/08/2021 1153   MONOABS 0.5 07/13/2015 1516   EOSABS 0.1 03/08/2021 1153   EOSABS 0.1 07/13/2015 1516  BASOSABS 0.0 03/08/2021 1153   BASOSABS 0.0 07/13/2015 1516    CMP     Component Value Date/Time   NA 142 03/08/2021 1153   NA 139 07/13/2015 1516   K 4.6 03/08/2021 1153   K 3.9 07/13/2015 1516   CL 110 03/08/2021 1153   CO2 22 03/08/2021 1153   CO2 25 07/13/2015 1516   GLUCOSE 121 (H) 03/08/2021 1153   GLUCOSE 85 07/13/2015 1516   BUN 21 03/08/2021 1153   BUN 7.9 07/13/2015 1516   CREATININE 1.70 (H) 03/08/2021 1153   CREATININE 0.9 07/13/2015 1516   CALCIUM 9.6 03/08/2021 1153   CALCIUM 8.9 07/13/2015 1516   PROT 6.2 (L) 03/08/2021 1153   PROT 7.6 07/13/2015 1516   ALBUMIN 3.3 (L) 03/08/2021 1153   ALBUMIN 3.3 (L) 07/13/2015 1516   AST 12 (L) 03/08/2021 1153   AST 16 07/13/2015 1516   ALT 16 03/08/2021 1153   ALT 12 07/13/2015 1516   ALKPHOS 59 03/08/2021 1153   ALKPHOS 99  07/13/2015 1516   BILITOT 0.3 03/08/2021 1153   BILITOT <0.30 07/13/2015 1516   GFRNONAA 31 (L) 03/08/2021 1153   GFRNONAA 61 05/10/2014 1147   GFRAA 41 (L) 04/21/2020 1024   GFRAA 70 05/10/2014 1147        ASSESSMENT and THERAPY PLAN:   Multiple myeloma ([place]) CT abdomen.pelvis on 01/07/20 for a cough and persistent acid reflux showed lytic lesions in the pelvis and L5 vertebral body She underwent a bone marrow biopsy on 01/17/20: 10 to 25% plasma cells  PET scan on 01/20/20 showed hypermetabolism corresponding to left acetabular and L5 lytic lesions  1) Newly diagnosed IgG Kappa Multiple myeloma with bone lesions, anemia, renal insuff. M spike @ 3.7g/dl on diagnosis. 1p deletion, polymorphic variant, 13q deletion  02/11/2020-05/19/2020: daratumumab 02/29/2020-03/13/2020: palliative radiation to myeloma bone lesions 06/02/2020-10/05/2020: Carfilzomib and Daratumumab 10/19/2020: Maintenance Daratumumab  ________________________________________________________________________________________________________  Carlus Pavlov is here today for follow up of her multiple myeloma on treatment with maitnenance daratumumab.  Her most recent M spike was 0.1.  She is tolerating her treatment well and will proceed with this today and continue it every four weeks.    She has b12 deficiency and I updated her orders so that she will receive her b12 every 4 weeks.   She will continue with Pamidronate which is due again in 4 weeks.  Cordell has some dizziness.  She is going to do the epley maneuver when she returns home.  She is also going to decrease her losartan from $RemoveBefor'100mg'OLcGbCaeHkgS$  daily to $Remove'50mg'weNaCvh$  daily.  I sent in meclizine at her request if the epley maneuver and bp med decrease doesn't improve her dizziness and her bp remains stable.    Dezerae will return in 4 weeks for labs, f/u and her next treatment.  She knows to call for any questions that may arise between now and her next appointment.  We are happy to see her  sooner if needed.  Total encounter time: 45 minutes in chart review, lab review, documentation review, face to face visit time, order entry, and documentation of the encounter.    Wilber Bihari, NP 03/08/21 1:19 PM Medical Oncology and Hematology [facility] [place], [place] [address] Tel. [phone]    Fax. [phone]  *Total Encounter Time as defined by the Centers for Medicare and Medicaid Services includes, in addition to the face-to-face time of a patient visit (documented in the note above) non-face-to-face time: obtaining and reviewing outside  history, ordering and reviewing medications, tests or procedures, care coordination (communications with other health care professionals or caregivers) and documentation in the medical record.

## 2021-03-09 ENCOUNTER — Ambulatory Visit: Payer: Medicare Other | Admitting: Registered Nurse

## 2021-03-12 ENCOUNTER — Telehealth: Payer: Self-pay | Admitting: Gastroenterology

## 2021-03-12 NOTE — Telephone Encounter (Signed)
The pt has been advised that she can proceed with planned procedure.

## 2021-03-12 NOTE — Telephone Encounter (Signed)
Pt is scheduled for colon on 8/18. She stated that her BP has been fluctuating but predominantly low despite changes on her BP medication. She reported to feel very tired and weak so she wants to know if she should still undergo colonoscopy on 03/15/21. Pls call her.

## 2021-03-12 NOTE — Telephone Encounter (Signed)
Should be safe to proceed for procedure this week

## 2021-03-12 NOTE — Telephone Encounter (Signed)
The pt has an enteroscopy scheduled for 8/18 and states that her BP has been fluctuating.  The past few days the numbers have ran around 118/80 up to 148/80.  She is currently adjusting her BP meds and wants to be sure she can proceed with procedure as planned.  Please advise

## 2021-03-13 LAB — MULTIPLE MYELOMA PANEL, SERUM
Albumin SerPl Elph-Mcnc: 3.2 g/dL (ref 2.9–4.4)
Albumin/Glob SerPl: 1.3 (ref 0.7–1.7)
Alpha 1: 0.2 g/dL (ref 0.0–0.4)
Alpha2 Glob SerPl Elph-Mcnc: 1.3 g/dL — ABNORMAL HIGH (ref 0.4–1.0)
B-Globulin SerPl Elph-Mcnc: 0.8 g/dL (ref 0.7–1.3)
Gamma Glob SerPl Elph-Mcnc: 0.3 g/dL — ABNORMAL LOW (ref 0.4–1.8)
Globulin, Total: 2.6 g/dL (ref 2.2–3.9)
IgA: 54 mg/dL — ABNORMAL LOW (ref 64–422)
IgG (Immunoglobin G), Serum: 394 mg/dL — ABNORMAL LOW (ref 586–1602)
IgM (Immunoglobulin M), Srm: 37 mg/dL (ref 26–217)
M Protein SerPl Elph-Mcnc: 0.1 g/dL — ABNORMAL HIGH
Total Protein ELP: 5.8 g/dL — ABNORMAL LOW (ref 6.0–8.5)

## 2021-03-15 ENCOUNTER — Other Ambulatory Visit: Payer: Self-pay

## 2021-03-15 ENCOUNTER — Ambulatory Visit (HOSPITAL_COMMUNITY)
Admission: RE | Admit: 2021-03-15 | Discharge: 2021-03-15 | Disposition: A | Discharge status: Home / Self Care | Payer: Medicare Other | Attending: Gastroenterology | Admitting: Gastroenterology

## 2021-03-15 ENCOUNTER — Ambulatory Visit (HOSPITAL_COMMUNITY): Payer: Medicare Other | Admitting: Certified Registered"

## 2021-03-15 ENCOUNTER — Encounter (HOSPITAL_COMMUNITY)
Admission: RE | Disposition: A | Discharge status: Home / Self Care | Payer: Self-pay | Source: Home / Self Care | Attending: Gastroenterology

## 2021-03-15 ENCOUNTER — Encounter (HOSPITAL_COMMUNITY): Payer: Self-pay | Admitting: Gastroenterology

## 2021-03-15 DIAGNOSIS — Z888 Allergy status to other drugs, medicaments and biological substances status: Secondary | ICD-10-CM | POA: Diagnosis not present

## 2021-03-15 DIAGNOSIS — K5520 Angiodysplasia of colon without hemorrhage: Secondary | ICD-10-CM

## 2021-03-15 DIAGNOSIS — I10 Essential (primary) hypertension: Secondary | ICD-10-CM | POA: Diagnosis not present

## 2021-03-15 DIAGNOSIS — Q2733 Arteriovenous malformation of digestive system vessel: Secondary | ICD-10-CM | POA: Diagnosis not present

## 2021-03-15 DIAGNOSIS — Z923 Personal history of irradiation: Secondary | ICD-10-CM | POA: Insufficient documentation

## 2021-03-15 DIAGNOSIS — Z91048 Other nonmedicinal substance allergy status: Secondary | ICD-10-CM | POA: Insufficient documentation

## 2021-03-15 DIAGNOSIS — Z791 Long term (current) use of non-steroidal anti-inflammatories (NSAID): Secondary | ICD-10-CM | POA: Insufficient documentation

## 2021-03-15 DIAGNOSIS — Z803 Family history of malignant neoplasm of breast: Secondary | ICD-10-CM | POA: Diagnosis not present

## 2021-03-15 DIAGNOSIS — Z833 Family history of diabetes mellitus: Secondary | ICD-10-CM | POA: Diagnosis not present

## 2021-03-15 DIAGNOSIS — Z808 Family history of malignant neoplasm of other organs or systems: Secondary | ICD-10-CM | POA: Insufficient documentation

## 2021-03-15 DIAGNOSIS — Z825 Family history of asthma and other chronic lower respiratory diseases: Secondary | ICD-10-CM | POA: Diagnosis not present

## 2021-03-15 DIAGNOSIS — Z801 Family history of malignant neoplasm of trachea, bronchus and lung: Secondary | ICD-10-CM | POA: Insufficient documentation

## 2021-03-15 DIAGNOSIS — Z800 Family history of malignant neoplasm of digestive organs: Secondary | ICD-10-CM | POA: Diagnosis not present

## 2021-03-15 DIAGNOSIS — C9000 Multiple myeloma not having achieved remission: Secondary | ICD-10-CM | POA: Diagnosis not present

## 2021-03-15 DIAGNOSIS — I2510 Atherosclerotic heart disease of native coronary artery without angina pectoris: Secondary | ICD-10-CM | POA: Diagnosis not present

## 2021-03-15 DIAGNOSIS — Z91040 Latex allergy status: Secondary | ICD-10-CM | POA: Diagnosis not present

## 2021-03-15 DIAGNOSIS — Z8673 Personal history of transient ischemic attack (TIA), and cerebral infarction without residual deficits: Secondary | ICD-10-CM | POA: Diagnosis not present

## 2021-03-15 DIAGNOSIS — Z823 Family history of stroke: Secondary | ICD-10-CM | POA: Insufficient documentation

## 2021-03-15 DIAGNOSIS — Z881 Allergy status to other antibiotic agents status: Secondary | ICD-10-CM | POA: Insufficient documentation

## 2021-03-15 DIAGNOSIS — D508 Other iron deficiency anemias: Secondary | ICD-10-CM | POA: Diagnosis not present

## 2021-03-15 DIAGNOSIS — Z880 Allergy status to penicillin: Secondary | ICD-10-CM | POA: Insufficient documentation

## 2021-03-15 DIAGNOSIS — Z91041 Radiographic dye allergy status: Secondary | ICD-10-CM | POA: Diagnosis not present

## 2021-03-15 DIAGNOSIS — D509 Iron deficiency anemia, unspecified: Secondary | ICD-10-CM | POA: Diagnosis not present

## 2021-03-15 DIAGNOSIS — Z87891 Personal history of nicotine dependence: Secondary | ICD-10-CM | POA: Diagnosis not present

## 2021-03-15 DIAGNOSIS — Z853 Personal history of malignant neoplasm of breast: Secondary | ICD-10-CM | POA: Insufficient documentation

## 2021-03-15 DIAGNOSIS — Z7952 Long term (current) use of systemic steroids: Secondary | ICD-10-CM | POA: Insufficient documentation

## 2021-03-15 HISTORY — PX: ENTEROSCOPY: SHX5533

## 2021-03-15 HISTORY — PX: HOT HEMOSTASIS: SHX5433

## 2021-03-15 LAB — GLUCOSE, CAPILLARY: Glucose-Capillary: 108 mg/dL — ABNORMAL HIGH (ref 70–99)

## 2021-03-15 SURGERY — ENTEROSCOPY
Anesthesia: Monitor Anesthesia Care

## 2021-03-15 MED ORDER — PROPOFOL 500 MG/50ML IV EMUL
INTRAVENOUS | Status: DC | PRN
  Administered 2021-03-15: 70 ug/kg/min via INTRAVENOUS

## 2021-03-15 MED ORDER — LACTATED RINGERS IV SOLN
INTRAVENOUS | Status: DC
  Administered 2021-03-15: 1000 mL via INTRAVENOUS

## 2021-03-15 MED ORDER — SODIUM CHLORIDE 0.9 % IV SOLN: INTRAVENOUS | Status: DC

## 2021-03-15 MED ORDER — PHENYLEPHRINE HCL (PRESSORS) 10 MG/ML IV SOLN
INTRAVENOUS | Status: AC
  Filled 2021-03-15: qty 2

## 2021-03-15 MED ORDER — LIDOCAINE 2% (20 MG/ML) 5 ML SYRINGE
INTRAMUSCULAR | Status: DC | PRN
  Administered 2021-03-15: 80 mg via INTRAVENOUS

## 2021-03-15 MED ORDER — PROPOFOL 10 MG/ML IV BOLUS
INTRAVENOUS | Status: DC | PRN
Start: 2021-03-15 — End: 2021-03-15
  Administered 2021-03-15: 10 mg via INTRAVENOUS
  Administered 2021-03-15: 35 mg via INTRAVENOUS
  Administered 2021-03-15 (×2): 10 mg via INTRAVENOUS

## 2021-03-15 NOTE — H&P (Signed)
HPI: This is a woman with multifactorial IDA     ROS: complete GI ROS as described in HPI, all other review negative.  Constitutional:  No unintentional weight loss   Past Medical History:  Diagnosis Date   Angiodysplasia of intestine 08/23/2020   Anxiety    Arthritis    Breast cancer ([place]) 06/13/15   Cancer ([place]) 2000   breast cancer   Chronic bronchitis ([place])    Chronic bronchitis ([place])    Hyperlipidemia    Hypertension    Myocardial infarction [facility]) 2001   Personal history of radiation therapy    Restless leg    Stroke ([place]) 2004   TIA, no deficits    Past Surgical History:  Procedure Laterality Date   ABDOMINAL HYSTERECTOMY  1985   BREAST LUMPECTOMY Left 2000   radiation and chemo   BREAST LUMPECTOMY Right 2016   radiation   BREAST SURGERY  2001   lt breast lumpectomy   COLONOSCOPY WITH ESOPHAGOGASTRODUODENOSCOPY (EGD)  04/2020   GIVENS CAPSULE STUDY  07/2020   IR IMAGING GUIDED PORT INSERTION  04/25/2020   RADIOACTIVE SEED GUIDED PARTIAL MASTECTOMY WITH AXILLARY SENTINEL LYMPH NODE BIOPSY Right 07/07/2015   Procedure: RIGHT RADIOACTIVE SEED GUIDED PARTIAL MASTECTOMY WITH AXILLARY SENTINEL LYMPH NODE BIOPSY;  Surgeon: Autumn Messing III, MD;  Location: [facility];  Service: General;  Laterality: Right;   SMALL INTESTINE SURGERY     TUBAL LIGATION      Current Facility-Administered Medications  Medication Dose Route Frequency Provider Last Rate Last Admin   0.9 %  sodium chloride infusion   Intravenous Continuous Milus Banister, MD       lactated ringers infusion   Intravenous Continuous Milus Banister, MD        Allergies as of 01/24/2021 - Review Complete 01/24/2021  Allergen Reaction Noted   Bacitracin-neomycin-polymyxin  [neomycin-bacitracin zn-polymyx] Swelling 12/29/2014   Nsaids Other (See Comments) 01/04/2015   Tape Hives 11/07/2013   Ambien [zolpidem tartrate]  04/02/2012   Amoxicillin  02/03/2012   Clavulanic acid Hives 11/20/2020    Contrast media [iodinated diagnostic agents] Hives 11/06/2013   Latex Swelling 09/14/2014   Prednisone  09/05/2014    Family History  Problem Relation Age of Onset   Emphysema Mother 46       smoker   Diabetes Father    Lung cancer Sister        dx. <50; former smoker   Diabetes Brother    Diabetes Brother    Brain cancer Brother 46       unknown tumor type   Stroke Maternal Grandmother    Diabetes Paternal Grandmother    Cancer Daughter 67       neck cancer   Other Daughter        hysterectomy for unspecified reason   Colon cancer Daughter    Diabetes Paternal Aunt    Breast cancer Cousin    Cancer Cousin        unspecified type   Breast cancer Other        triple negative breast cancer in her 58s   Colon polyps Neg Hx    Esophageal cancer Neg Hx    Gallbladder disease Neg Hx     Social History   Socioeconomic History   Marital status: Divorced    Spouse name: Not on file   Number of children: 7   Years of education: Not on file   Highest education level: Not on file  Occupational History   Occupation: retired  Tobacco Use   Smoking status: Former    Packs/day: 1.00    Years: 20.00    Pack years: 20.00    Types: Cigarettes    Quit date: 07/30/2011    Years since quitting: 9.6   Smokeless tobacco: Never   Tobacco comments:    Quit >4 years ago; 1 ppd for about 5/20 years (remaining was less)  Vaping Use   Vaping Use: Former  Substance and Sexual Activity   Alcohol use: No    Alcohol/week: 0.0 standard drinks   Drug use: No   Sexual activity: Not Currently  Other Topics Concern   Not on file  Social History Narrative   Lives alone.  Retired.  Education:  11th grade GED.  Children:  7 (one here).    Social Determinants of Health   Financial Resource Strain: Low Risk    Difficulty of Paying Living Expenses: Not hard at all  Food Insecurity: No Food Insecurity   Worried About Charity fundraiser in the Last Year: Never true   Arboriculturist in  the Last Year: Never true  Transportation Needs: Not on file  Physical Activity: Inactive   Days of Exercise per Week: 0 days   Minutes of Exercise per Session: 0 min  Stress: No Stress Concern Present   Feeling of Stress : Not at all  Social Connections: Moderately Isolated   Frequency of Communication with Friends and Family: More than three times a week   Frequency of Social Gatherings with Friends and Family: Once a week   Attends Religious Services: 1 to 4 times per year   Active Member of [employer] or Organizations: No   Attends Music therapist: Never   Marital Status: Divorced  Human resources officer Violence: Not At Risk   Fear of Current or Ex-Partner: No   Emotionally Abused: No   Physically Abused: No   Sexually Abused: No     Physical Exam: BP (!) 170/56   Pulse 66   Temp 97.8 F (36.6 C) (Oral)   Resp 18   Ht '5\' 2"'$  (1.575 m)   Wt 85.3 kg   SpO2 96%   BMI 34.40 kg/m  Constitutional: generally well-appearing Psychiatric: alert and oriented x3 Lungs: CTA bilaterally Heart: no MCR  Assessment and plan: 75 y.o. female with multifactorial IDA  For SB enteroscopy to attempt to locate and ablate SB AVMs noted on recent SB capsule endo    Owens Loffler, MD [facility] Gastroenterology 03/15/2021, 7:09 AM

## 2021-03-15 NOTE — Discharge Instructions (Signed)
YOU HAD AN ENDOSCOPIC PROCEDURE TODAY: Refer to the procedure report and other information in the discharge instructions given to you for any specific questions about what was found during the examination. If this information does not answer your questions, please call [place] office at [phone] to clarify.   YOU SHOULD EXPECT: Some feelings of bloating in the abdomen. Passage of more gas than usual. Walking can help get rid of the air that was put into your GI tract during the procedure and reduce the bloating. If you had a lower endoscopy (such as a colonoscopy or flexible sigmoidoscopy) you may notice spotting of blood in your stool or on the toilet paper. Some abdominal soreness may be present for a day or two, also.  DIET: Your first meal following the procedure should be a light meal and then it is ok to progress to your normal diet. A half-sandwich or bowl of soup is an example of a good first meal. Heavy or fried foods are harder to digest and may make you feel nauseous or bloated. Drink plenty of fluids but you should avoid alcoholic beverages for 24 hours. If you had a esophageal dilation, please see attached instructions for diet.    ACTIVITY: Your care partner should take you home directly after the procedure. You should plan to take it easy, moving slowly for the rest of the day. You can resume normal activity the day after the procedure however YOU SHOULD NOT DRIVE, use power tools, machinery or perform tasks that involve climbing or major physical exertion for 24 hours (because of the sedation medicines used during the test).   SYMPTOMS TO REPORT IMMEDIATELY: A gastroenterologist can be reached at any hour. Please call [phone]  for any of the following symptoms:   Following upper endoscopy (EGD, EUS, ERCP, esophageal dilation) Vomiting of blood or coffee ground material  New, significant abdominal pain  New, significant chest pain or pain under the shoulder blades  Painful or  persistently difficult swallowing  New shortness of breath  Black, tarry-looking or red, bloody stools  FOLLOW UP:  If any biopsies were taken you will be contacted by phone or by letter within the next 1-3 weeks. Call [phone]  if you have not heard about the biopsies in 3 weeks.  Please also call with any specific questions about appointments or follow up tests.  

## 2021-03-15 NOTE — Transfer of Care (Signed)
Immediate Anesthesia Transfer of Care Note  Patient: Tracy [name]  Procedure(s) Performed: ENTEROSCOPY HOT HEMOSTASIS (ARGON PLASMA COAGULATION/BICAP)  Patient Location: PACU  Anesthesia Type:MAC  Level of Consciousness: awake, alert , oriented and patient cooperative  Airway & Oxygen Therapy: Patient Spontanous Breathing and Patient connected to face mask oxygen  Post-op Assessment: Report given to RN and Post -op Vital signs reviewed and stable  Post vital signs: Reviewed and stable  Last Vitals:  Vitals Value Taken Time  BP    Temp    Pulse    Resp    SpO2      Last Pain:  Vitals:   03/15/21 0641  TempSrc: Oral  PainSc: 0-No pain         Complications: No notable events documented.

## 2021-03-15 NOTE — Anesthesia Preprocedure Evaluation (Signed)
Anesthesia Evaluation  Patient identified by MRN, date of birth, ID band Patient awake    Reviewed: Allergy & Precautions, NPO status , Patient's Chart, lab work & pertinent test results  Airway Mallampati: III  TM Distance: <3 FB Neck ROM: Full    Dental no notable dental hx.    Pulmonary asthma , sleep apnea , former smoker,    Pulmonary exam normal breath sounds clear to auscultation       Cardiovascular hypertension, + CAD  negative cardio ROS Normal cardiovascular exam Rhythm:Regular Rate:Normal     Neuro/Psych TIAnegative psych ROS   GI/Hepatic negative GI ROS, Neg liver ROS,   Endo/Other  negative endocrine ROS  Renal/GU negative Renal ROS  negative genitourinary   Musculoskeletal negative musculoskeletal ROS (+)   Abdominal   Peds negative pediatric ROS (+)  Hematology negative hematology ROS (+) anemia ,   Anesthesia Other Findings   Reproductive/Obstetrics negative OB ROS                             Anesthesia Physical Anesthesia Plan  ASA: 3  Anesthesia Plan: MAC   Post-op Pain Management:    Induction: Intravenous  PONV Risk Score and Plan: 2 and Propofol infusion  Airway Management Planned: Simple Face Mask  Additional Equipment:   Intra-op Plan:   Post-operative Plan:   Informed Consent: I have reviewed the patients History and Physical, chart, labs and discussed the procedure including the risks, benefits and alternatives for the proposed anesthesia with the patient or authorized representative who has indicated his/her understanding and acceptance.     Dental advisory given  Plan Discussed with: CRNA and Surgeon  Anesthesia Plan Comments:         Anesthesia Quick Evaluation

## 2021-03-15 NOTE — Op Note (Signed)
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 Estimated blood loss:                            None. Estimated Blood Loss:     Estimated blood loss: none. Procedure:                Pre-Anesthesia Assessment:                           - Prior to the procedure, a History and Physical                            was performed, and patient medications and                            allergies were reviewed. The patient's tolerance of                            previous anesthesia was also reviewed. The risks                            and benefits of the procedure and the sedation                            options and risks were discussed with the patient.                            All questions were answered, and informed consent                            was obtained. Prior Anticoagulants: The patient has  taken no previous anticoagulant or antiplatelet                            agents. ASA Grade Assessment: III - A patient with                            severe systemic disease. After reviewing the risks                            and benefits, the patient was deemed in                            satisfactory condition to undergo the procedure.                           After obtaining informed  consent, the endoscope was                            passed under direct vision. Throughout the                            procedure, the patient's blood pressure, pulse, and                            oxygen saturations were monitored continuously. The                            [id number]) Olympus colonoscope was                            introduced through the mouth and advanced to the                            mid-jejunum. The small bowel enteroscopy was                            accomplished without difficulty. The patient                            tolerated the procedure well. Scope In: Scope Out: Findings:      One angioectasia with no bleeding was found in the (approximate)       mid-jejunum. Coagulation for bleeding prevention using argon plasma was       successful.      The examination was otherwise normal. Impression:               - One non-bleeding angioectasia in the jejunum,                            ablated with application of APC.                           - The examination was otherwise normal. Recommendation:           - Discharge patient to home.                           -  Hematology to follow your blood counts.                           - Call if any concern for signficant GI bleeding. Procedure Code(s):        --- Professional ---                           [phone], Small intestinal endoscopy, enteroscopy                            beyond second portion of duodenum, not including                            ileum; with control of bleeding (eg, injection,                            bipolar cautery, unipolar cautery, laser, heater                            probe, stapler, plasma coagulator) Diagnosis Code(s):        --- Professional ---                           K55.20, Angiodysplasia of colon without hemorrhage                           D50.9, Iron deficiency anemia, unspecified CPT copyright 2019 [employer]. All rights  reserved. The codes documented in this report are preliminary and upon coder review may  be revised to meet current compliance requirements. Milus Banister, MD 03/15/2021 7:57:21 AM This report has been signed electronically. Number of Addenda: 0

## 2021-03-15 NOTE — Anesthesia Postprocedure Evaluation (Signed)
Anesthesia Post Note  Patient: Tracy [name]  Procedure(s) Performed: ENTEROSCOPY HOT HEMOSTASIS (ARGON PLASMA COAGULATION/BICAP)     Patient location during evaluation: PACU Anesthesia Type: MAC Level of consciousness: awake and alert Pain management: pain level controlled Vital Signs Assessment: post-procedure vital signs reviewed and stable Respiratory status: spontaneous breathing, nonlabored ventilation, respiratory function stable and patient connected to nasal cannula oxygen Cardiovascular status: stable and blood pressure returned to baseline Postop Assessment: no apparent nausea or vomiting Anesthetic complications: no   No notable events documented.  Last Vitals:  Vitals:   03/15/21 0820 03/15/21 0826  BP: (!) 145/59 (!) 170/72  Pulse: 65 70  Resp: 13 17  Temp:    SpO2: 100% 100%    Last Pain:  Vitals:   03/15/21 0826  TempSrc:   PainSc: 0-No pain                 Tracker Mance S

## 2021-03-16 ENCOUNTER — Other Ambulatory Visit: Payer: Self-pay

## 2021-03-16 ENCOUNTER — Encounter (HOSPITAL_COMMUNITY): Payer: Self-pay | Admitting: Gastroenterology

## 2021-03-16 ENCOUNTER — Emergency Department (HOSPITAL_COMMUNITY)
Admission: EM | Admit: 2021-03-16 | Discharge: 2021-03-17 | Disposition: A | Discharge status: Home / Self Care | Payer: Medicare Other | Attending: Emergency Medicine | Admitting: Emergency Medicine

## 2021-03-16 ENCOUNTER — Emergency Department (HOSPITAL_COMMUNITY): Payer: Medicare Other

## 2021-03-16 DIAGNOSIS — R404 Transient alteration of awareness: Secondary | ICD-10-CM | POA: Diagnosis not present

## 2021-03-16 DIAGNOSIS — Z743 Need for continuous supervision: Secondary | ICD-10-CM | POA: Diagnosis not present

## 2021-03-16 DIAGNOSIS — E1140 Type 2 diabetes mellitus with diabetic neuropathy, unspecified: Secondary | ICD-10-CM | POA: Insufficient documentation

## 2021-03-16 DIAGNOSIS — Z853 Personal history of malignant neoplasm of breast: Secondary | ICD-10-CM | POA: Insufficient documentation

## 2021-03-16 DIAGNOSIS — I10 Essential (primary) hypertension: Secondary | ICD-10-CM | POA: Insufficient documentation

## 2021-03-16 DIAGNOSIS — R531 Weakness: Secondary | ICD-10-CM | POA: Diagnosis not present

## 2021-03-16 DIAGNOSIS — R600 Localized edema: Secondary | ICD-10-CM | POA: Insufficient documentation

## 2021-03-16 DIAGNOSIS — R29818 Other symptoms and signs involving the nervous system: Secondary | ICD-10-CM | POA: Diagnosis not present

## 2021-03-16 DIAGNOSIS — Z91040 Latex allergy status: Secondary | ICD-10-CM | POA: Diagnosis not present

## 2021-03-16 DIAGNOSIS — Z87891 Personal history of nicotine dependence: Secondary | ICD-10-CM | POA: Diagnosis not present

## 2021-03-16 DIAGNOSIS — R110 Nausea: Secondary | ICD-10-CM | POA: Insufficient documentation

## 2021-03-16 DIAGNOSIS — R55 Syncope and collapse: Secondary | ICD-10-CM | POA: Insufficient documentation

## 2021-03-16 DIAGNOSIS — R519 Headache, unspecified: Secondary | ICD-10-CM | POA: Diagnosis not present

## 2021-03-16 DIAGNOSIS — Z79899 Other long term (current) drug therapy: Secondary | ICD-10-CM | POA: Insufficient documentation

## 2021-03-16 DIAGNOSIS — J45909 Unspecified asthma, uncomplicated: Secondary | ICD-10-CM | POA: Diagnosis not present

## 2021-03-16 DIAGNOSIS — R079 Chest pain, unspecified: Secondary | ICD-10-CM | POA: Insufficient documentation

## 2021-03-16 DIAGNOSIS — Z7984 Long term (current) use of oral hypoglycemic drugs: Secondary | ICD-10-CM | POA: Diagnosis not present

## 2021-03-16 DIAGNOSIS — R6889 Other general symptoms and signs: Secondary | ICD-10-CM | POA: Diagnosis not present

## 2021-03-16 MED ORDER — SODIUM CHLORIDE 0.9 % IV BOLUS
500.0000 mL | Freq: Once | INTRAVENOUS | Status: AC
  Administered 2021-03-17: 500 mL via INTRAVENOUS

## 2021-03-16 NOTE — ED Provider Notes (Signed)
[facility] DEPT Provider Note   CSN: [id number] Arrival date & time: 03/16/21  2041     History Chief Complaint  Patient presents with   Weakness    Tracy [name] is a 75 y.o. female with history of hyperlipidemia, hypertension, and multiple myeloma who presents to the emergency department for weakness that began around 4 hours ago.  She states that she was talking to her niece on the phone sitting in the chair when she became generally weak and felt like she was going to pass out.  She did not lose consciousness but does report nausea and headache.  She is noted similar symptoms intermittently over the last couple of weeks and has spoke with her PCP about this and was advised to cut her losartan in half which did not help.  The episode prior to arrival lasted around 30 minutes and resolved spontaneously.  Her weakness is worse with positional changes.  Denies any focal weakness or numbness.  She was not exerting herself.  She reports overall being healthy and denies any recent illness including sore throat, cough, fever, chills, or abdominal pain.  She has been eating and drinking well.  Denies any urinary symptoms.  Has been moving her bowels normally.  She also an episode of left-sided chest pain characterized as pressure earlier today that radiated up the left side of her neck.  The episode lasted around 20 minutes and resolved spontaneously.  She did not report any shortness of breath, diaphoresis, palpitations, nausea, or vomiting during this episode.  She does report mild leg swelling and is unable to lay flat at night but she states this has been chronic for months.   Weakness Associated symptoms: chest pain, headaches and nausea   Associated symptoms: no abdominal pain, no dizziness, no dysuria, no fever, no shortness of breath and no vomiting       Past Medical History:  Diagnosis Date   Angiodysplasia of intestine 08/23/2020   Anxiety     Arthritis    Breast cancer ([place]) 06/13/15   Cancer ([place]) 2000   breast cancer   Chronic bronchitis ([place])    Chronic bronchitis ([place])    Hyperlipidemia    Hypertension    Myocardial infarction [facility]) 2001   Personal history of radiation therapy    Restless leg    Stroke ([place]) 2004   TIA, no deficits    Patient Active Problem List   Diagnosis Date Noted   Bone metastases ([place]) 02/08/2021   Angiodysplasia of intestine 08/23/2020   Port-A-Cath in place 08/04/2020   Counseling regarding advance care planning and goals of care 02/07/2020   Multiple myeloma ([place]) 01/25/2020   Dizziness 10/12/2019   Post-nasal drainage 10/12/2019   Allergic rhinitis 08/19/2018   Type 2 diabetes mellitus with diabetic neuropathy, unspecified ([place]) 11/05/2017   Encounter for long-term use of muscle relaxants 09/24/2017   CAD in native artery 09/24/2017   OSA (obstructive sleep apnea) 09/24/2017   [place] 09/24/2017   Asthenia 06/30/2017   Morbid obesity ([place]) 06/02/2017   Depression 06/02/2017   Iron deficiency anemia 09/23/2016   Anemia of chronic disease 09/28/2015   Genetic testing 08/21/2015   History of left breast cancer 07/13/2015   History of right breast cancer 06/20/2015   Hearing loss due to cerumen impaction 12/29/2014   Allergy to adhesive tape 05/19/2014   Hyperlipidemia 12/06/2013   GERD (gastroesophageal reflux disease) 12/06/2013   Cervical disc disease 11/11/2013   Osteopenia 05/25/2013  Allergic asthma 12/24/2012   Insomnia 04/02/2012   Physical exam, annual 04/02/2012   HTN (hypertension) 02/03/2012   Vertigo, benign positional 02/03/2012   Left groin pain 02/03/2012   Hip pain 10/10/2011   TIA (transient ischemic attack) 07/24/2011   Allergic reaction 07/05/2011   Osteoarthrosis involving lower leg 10/19/2010   Disorder of bone and cartilage 05/01/2010   Generalized anxiety disorder 05/01/2010   Vitamin D deficiency 02/20/2010   Unspecified chronic bronchitis ([place])  08/24/2009   Other lymphedema 10/29/2007    Past Surgical History:  Procedure Laterality Date   ABDOMINAL HYSTERECTOMY  1985   BREAST LUMPECTOMY Left 2000   radiation and chemo   BREAST LUMPECTOMY Right 2016   radiation   BREAST SURGERY  2001   lt breast lumpectomy   COLONOSCOPY WITH ESOPHAGOGASTRODUODENOSCOPY (EGD)  04/2020   ENTEROSCOPY N/A 03/15/2021   Procedure: ENTEROSCOPY;  Surgeon: Milus Banister, MD;  Location: [facility];  Service: Endoscopy;  Laterality: N/A;   GIVENS CAPSULE STUDY  07/2020   HOT HEMOSTASIS N/A 03/15/2021   Procedure: HOT HEMOSTASIS (ARGON PLASMA COAGULATION/BICAP);  Surgeon: Milus Banister, MD;  Location: [facility];  Service: Endoscopy;  Laterality: N/A;   IR IMAGING GUIDED PORT INSERTION  04/25/2020   RADIOACTIVE SEED GUIDED PARTIAL MASTECTOMY WITH AXILLARY SENTINEL LYMPH NODE BIOPSY Right 07/07/2015   Procedure: RIGHT RADIOACTIVE SEED GUIDED PARTIAL MASTECTOMY WITH AXILLARY SENTINEL LYMPH NODE BIOPSY;  Surgeon: Autumn Messing III, MD;  Location: [facility];  Service: General;  Laterality: Right;   SMALL INTESTINE SURGERY     TUBAL LIGATION       OB History   No obstetric history on file.     Family History  Problem Relation Age of Onset   Emphysema Mother 12       smoker   Diabetes Father    Lung cancer Sister        dx. <50; former smoker   Diabetes Brother    Diabetes Brother    Brain cancer Brother 100       unknown tumor type   Stroke Maternal Grandmother    Diabetes Paternal Grandmother    Cancer Daughter 4       neck cancer   Other Daughter        hysterectomy for unspecified reason   Colon cancer Daughter    Diabetes Paternal Aunt    Breast cancer Cousin    Cancer Cousin        unspecified type   Breast cancer Other        triple negative breast cancer in her 21s   Colon polyps Neg Hx    Esophageal cancer Neg Hx    Gallbladder disease Neg Hx     Social History   Tobacco Use   Smoking status: Former     Packs/day: 1.00    Years: 20.00    Pack years: 20.00    Types: Cigarettes    Quit date: 07/30/2011    Years since quitting: 9.6   Smokeless tobacco: Never   Tobacco comments:    Quit >4 years ago; 1 ppd for about 5/20 years (remaining was less)  Vaping Use   Vaping Use: Former  Substance Use Topics   Alcohol use: No    Alcohol/week: 0.0 standard drinks   Drug use: No    Home Medications Prior to Admission medications   Medication Sig Start Date End Date Taking? Authorizing Provider  Accu-Chek Softclix Lancets lancets Use as instructed to  check sugars 1-2 times daily. 02/10/20   [name], MD  acyclovir (ZOVIRAX) 400 MG tablet Take 1 tablet (400 mg total) by mouth 2 (two) times daily. 12/29/20   [name], MD  albuterol (VENTOLIN HFA) 108 (90 Base) MCG/ACT inhaler Inhale 2 puffs into the lungs every 6 (six) hours as needed for wheezing or shortness of breath. 03/17/20   Tanner, Lyndon Code., PA-C  atorvastatin (LIPITOR) 10 MG tablet TAKE 1 TABLET BY MOUTH  DAILY Patient taking differently: Take 10 mg by mouth at bedtime. 04/04/20   [name], MD  B Complex-C (B-COMPLEX WITH VITAMIN C) tablet Take 1 tablet by mouth in the morning.    [provider]  dexamethasone (DECADRON) 4 MG tablet Take 3 tabs with breakfast the day after each treatment. 02/20/21   [name], MD  glucose blood (ACCU-CHEK GUIDE) test strip Use as instructed to check sugars 1-2 times daily. 02/10/20   [name], MD  lidocaine-prilocaine (EMLA) cream APPLY 1 APPLICATION TOPICALLY AS NEEDED. Patient taking differently: Apply 1 application topically as needed (prior to treatments). 09/25/20   [name], MD  loratadine (CLARITIN) 10 MG tablet Take 10 mg by mouth daily.    [provider]  losartan (COZAAR) 100 MG tablet TAKE 1 TABLET BY MOUTH  DAILY 04/04/20   [name], MD  meclizine (ANTIVERT) 25 MG tablet Take 1 tablet (25 mg total) by mouth 3 (three)  times daily as needed for dizziness. 03/08/21   Gardenia Phlegm, NP  metFORMIN (GLUCOPHAGE) 500 MG tablet TAKE 1 TABLET(500 MG) BY MOUTH TWICE DAILY WITH A MEAL 12/22/20   [name], MD  mometasone (NASONEX) 50 MCG/ACT nasal spray Place 2 sprays into the nose daily. Patient taking differently: Place 2 sprays into the nose daily as needed (allergies.). 03/16/20 03/16/21  [name], MD  Multiple Vitamin (MULTIVITAMIN WITH MINERALS) TABS tablet Take 1 tablet by mouth daily. Centrum for Women 50+    [provider]  ondansetron (ZOFRAN) 8 MG tablet Take 1 tablet (8 mg total) by mouth 2 (two) times daily as needed (Nausea or vomiting). 02/07/20   [name], MD  pantoprazole (PROTONIX) 40 MG tablet TAKE 1 TABLET BY MOUTH  TWICE DAILY Patient taking differently: TAKE 1 TABLET BY MOUTH  TWICE DAILY 02/05/21   [name], MD  potassium chloride SA (KLOR-CON) 20 MEQ tablet TAKE 1 TABLET(20 MEQ) BY MOUTH TWICE DAILY 12/29/20   [name], MD  pregabalin (LYRICA) 50 MG capsule TAKE 1 CAPSULE(50 MG) BY MOUTH THREE TIMES DAILY 12/22/20   [name], MD  prochlorperazine (COMPAZINE) 10 MG tablet Take 1 tablet (10 mg total) by mouth every 6 (six) hours as needed (Nausea or vomiting). 02/07/20   [name], MD  tiZANidine (ZANAFLEX) 4 MG tablet TAKE 1 TABLET(4 MG) BY MOUTH AT BEDTIME 12/22/20   [name], MD  traZODone (DESYREL) 100 MG tablet TAKE 1 TABLET BY MOUTH AT  BEDTIME 11/13/20   [name], MD  Vitamin D, Ergocalciferol, (DRISDOL) 1.25 MG (50000 UNIT) CAPS capsule TAKE 1 CAPSULE BY MOUTH 1 TIME A WEEK Patient taking differently: Take 50,000 Units by mouth every Sunday. 12/29/20   [name], MD    Allergies    Bacitracin-neomycin-polymyxin  [neomycin-bacitracin zn-polymyx], Nsaids, Tape, Ambien [zolpidem tartrate], Amoxicillin, Clavulanic acid, Contrast media [iodinated diagnostic agents], Latex, and  Prednisone  Review of Systems   Review of Systems  Constitutional:  Negative for appetite change, chills, diaphoresis, fatigue and fever.  HENT:  Negative for congestion and sore throat.   Eyes:  Negative for visual disturbance.  Respiratory:  Negative for shortness of breath.   Cardiovascular:  Positive for chest pain.  Gastrointestinal:  Positive for nausea. Negative for abdominal pain and vomiting.  Genitourinary:  Negative for difficulty urinating, dysuria and hematuria.  Neurological:  Positive for weakness, light-headedness and headaches. Negative for dizziness and syncope.  All other systems reviewed and are negative.  Physical Exam Updated Vital Signs BP (!) 107/49   Pulse 70   Temp 97.8 F (36.6 C) (Oral)   Resp 19   Ht _0  (1.575 m)   Wt 85.3 kg   SpO2 98%   BMI 34.39 kg/m   Physical Exam Constitutional:      Appearance: Normal appearance.  HENT:     Head: Normocephalic and atraumatic.  Eyes:     General:        Right eye: No discharge.        Left eye: No discharge.     Extraocular Movements: Extraocular movements intact.     Conjunctiva/sclera: Conjunctivae normal.  Cardiovascular:     Rate and Rhythm: Normal rate and regular rhythm.     Pulses:          Radial pulses are 2+ on the right side and 2+ on the left side.       Dorsalis pedis pulses are 2+ on the right side and 2+ on the left side.     Heart sounds: Normal heart sounds.  Pulmonary:     Effort: Pulmonary effort is normal.     Breath sounds: Normal breath sounds.  Abdominal:     General: There is distension.     Palpations: Abdomen is soft.     Tenderness: There is no abdominal tenderness.  Musculoskeletal:     Right lower leg: 1+ Pitting Edema present.     Left lower leg: 1+ Pitting Edema present.  Skin:    General: Skin is warm and dry.  Neurological:     Mental Status: She is alert.  Psychiatric:        Mood and Affect: Mood normal.    ED Results / Procedures / Treatments    Labs (all labs ordered are listed, but only abnormal results are displayed) Labs Reviewed - No data to display  EKG EKG Interpretation  Date/Time:  Friday March 16 2021 23:20:44 EDT Ventricular Rate:  77 PR Interval:  131 QRS Duration: 89 QT Interval:  396 QTC Calculation: 449 R Axis:   7 Text Interpretation: Sinus rhythm No significant change was found Confirmed by Ezequiel Essex [phone]) on 03/16/2021 11:42:28 PM  Radiology DG Chest 2 View  Result Date: 03/16/2021 CLINICAL DATA:  Chest pain. Started 2 weeks ago. Progressively worsening. EXAM: CHEST - 2 VIEW COMPARISON:  Chest x-ray 02/13/2020, CT chest 09/13/2015 FINDINGS: Right chest wall Port-A-Cath with tip overlying the expected region of distal superior vena cava. The heart and mediastinal contours are unchanged. Aortic calcification. No focal consolidation. No pulmonary edema. No pleural effusion. No pneumothorax. No acute osseous abnormality. IMPRESSION: No active cardiopulmonary disease. Electronically Signed   By: Iven Finn M.D.   On: 03/16/2021 23:18   CT HEAD WO CONTRAST (5MM)  Result Date: 03/17/2021 CLINICAL DATA:  Neuro deficit, acute, stroke suspected. Progressive weakness EXAM: CT HEAD WITHOUT CONTRAST TECHNIQUE: Contiguous axial images were obtained from the base of the skull through the  vertex without intravenous contrast. COMPARISON:  MRI 08/13/2017 FINDINGS: Brain: Normal anatomic configuration. Parenchymal volume loss is commensurate with the patient's age. No abnormal intra or extra-axial mass lesion or fluid collection. No abnormal mass effect or midline shift. No evidence of acute intracranial hemorrhage or infarct. Ventricular size is normal. Cerebellum unremarkable. Vascular: No asymmetric hyperdense vasculature at the skull base. Skull: Intact Sinuses/Orbits: Paranasal sinuses are clear. Orbits are unremarkable. Other: Mastoid air cells and middle ear cavities are clear. IMPRESSION: Unremarkable  examination.  No acute intracranial abnormality.  [place] Electronically Signed   By: Fidela Salisbury M.D.   On: 03/17/2021 00:30    Procedures Procedures   Medications Ordered in ED Medications - No data to display  ED Course  I have reviewed the triage vital signs and the nursing notes.  Pertinent labs & imaging results that were available during my care of the patient were reviewed by me and considered in my medical decision making (see chart for details).  Clinical Course as of 03/17/21 0318  Sat Mar 17, 2021  0043 Hemoglobin(!): 8.5 [CF]    Clinical Course User Index [CF] Cherrie Gauze   MDM Rules/Calculators/A&P                           Tracy [name] is a 75 year old female with history of hypertension, hyperlipidemia, multiple myeloma, and anemia who presented to the ED today for generalized weakness and near syncope.   The patient presented with positional dependent near syncope.  Given her history of hypertension, hyperlipidemia, and earlier chest pain episode ACS was considered.  EKG was reassuring as there was no ST elevations, depressions, or other overt signs of ischemic changes.  Rhythm was regular.  Initial troponin was also reassuring and was negative.  With her chronic orthopnea and somewhat new leg swelling CHF was considered.  BNP was normal and with a clear chest x-ray which I personally reviewed and agree with the radiologist interpretation CHF is less likely.  Glucose was slightly elevated making hypoglycemia less likely.  Given that her losartan dose was recently cut in half and she is not on a beta-blocker I believe that medication does not play a significant role.  Her history did not suggest BPPV and there was no nystagmus on physical exam making this less likely.  She was anemic at 8.5 however, this is very similar to her baseline and is likely not contributory.  Orthostatics were stable.  I likely think that this is vasovagal mediated.  Final Clinical  Impression(s) / ED Diagnoses Final diagnoses:  Generalized weakness    Rx / DC Orders ED Discharge Orders     None        Hendricks Limes, [place] 03/17/21 0329    Ezequiel Essex, MD 03/17/21 [phone]

## 2021-03-16 NOTE — ED Notes (Signed)
Kae Heller, [place] notified of slight hypotension

## 2021-03-16 NOTE — ED Triage Notes (Signed)
Pt came from home via EMS. Pt reports intermittent weakness started 2 weeks ago, progressively gotten worse, today pt felt weak and it didn't get better like it did before. Pt states "I just felt foggy and tired, and couldn't get up by myself". Pt denies urinary sx or blood in her stool. Only change today is that pt took a full dose of BP meds (losartan) after taking 1/2 dose of BP meds per pt's PCP x 4 days.  CBG 219; pt is on steroids for cancer tx Initial BP: 90/50; after sitting for about 10 minutes BP 88/50 20 in right hand; 500 NS; BP after that 110/50 Afebrile Per EMS, pt improved after fluid admin

## 2021-03-17 ENCOUNTER — Emergency Department (HOSPITAL_COMMUNITY): Payer: Medicare Other

## 2021-03-17 DIAGNOSIS — R531 Weakness: Secondary | ICD-10-CM | POA: Diagnosis not present

## 2021-03-17 DIAGNOSIS — R29818 Other symptoms and signs involving the nervous system: Secondary | ICD-10-CM | POA: Diagnosis not present

## 2021-03-17 LAB — COMPREHENSIVE METABOLIC PANEL
ALT: 19 U/L (ref 0–44)
AST: 18 U/L (ref 15–41)
Albumin: 3.3 g/dL — ABNORMAL LOW (ref 3.5–5.0)
Alkaline Phosphatase: 52 U/L (ref 38–126)
Anion gap: 5 (ref 5–15)
BUN: 21 mg/dL (ref 8–23)
CO2: 23 mmol/L (ref 22–32)
Calcium: 8.9 mg/dL (ref 8.9–10.3)
Chloride: 112 mmol/L — ABNORMAL HIGH (ref 98–111)
Creatinine, Ser: 1.56 mg/dL — ABNORMAL HIGH (ref 0.44–1.00)
GFR, Estimated: 34 mL/min — ABNORMAL LOW (ref >60)
Glucose, Bld: 125 mg/dL — ABNORMAL HIGH (ref 70–99)
Potassium: 4.4 mmol/L (ref 3.5–5.1)
Sodium: 140 mmol/L (ref 135–145)
Total Bilirubin: 0.7 mg/dL (ref 0.3–1.2)
Total Protein: 6.3 g/dL — ABNORMAL LOW (ref 6.5–8.1)

## 2021-03-17 LAB — URINALYSIS, ROUTINE W REFLEX MICROSCOPIC
Bilirubin Urine: NEGATIVE
Glucose, UA: NEGATIVE mg/dL
Hgb urine dipstick: NEGATIVE
Ketones, ur: NEGATIVE mg/dL
Nitrite: NEGATIVE
Protein, ur: NEGATIVE mg/dL
Specific Gravity, Urine: 1.015 (ref 1.005–1.030)
pH: 5 (ref 5.0–8.0)

## 2021-03-17 LAB — BRAIN NATRIURETIC PEPTIDE: B Natriuretic Peptide: 34 pg/mL (ref 0.0–100.0)

## 2021-03-17 LAB — CBC
HCT: 26.6 % — ABNORMAL LOW (ref 36.0–46.0)
Hemoglobin: 8.5 g/dL — ABNORMAL LOW (ref 12.0–15.0)
MCH: 31.8 pg (ref 26.0–34.0)
MCHC: 32 g/dL (ref 30.0–36.0)
MCV: 99.6 fL (ref 80.0–100.0)
Platelets: 184 10*3/uL (ref 150–400)
RBC: 2.67 MIL/uL — ABNORMAL LOW (ref 3.87–5.11)
RDW: 14.6 % (ref 11.5–15.5)
WBC: 5.3 10*3/uL (ref 4.0–10.5)
nRBC: 0 % (ref 0.0–0.2)

## 2021-03-17 LAB — TROPONIN I (HIGH SENSITIVITY)
Troponin I (High Sensitivity): 4 ng/L (ref <18)
Troponin I (High Sensitivity): 4 ng/L (ref <18)

## 2021-03-17 NOTE — Discharge Instructions (Signed)
You were seen and evaluated in the emergency department today for general weakness and lightheadedness.  Your lab work and EKG were reassuring and your heart is working normally.  I would encourage you to drink plenty of water and take your time when changing positions.  Thank you for let me participate in your care.

## 2021-03-17 NOTE — ED Notes (Signed)
Pt denies N/V upon fluid challenge

## 2021-03-17 NOTE — ED Notes (Signed)
Gave bedside report to Jessica, RN 

## 2021-03-17 NOTE — ED Notes (Signed)
Pt ambulated to bathroom with one person assist. Pt reports "I feel more weak than I normally do but better than when I got here." pt stumbled on left foot one time on the way to the bathroom

## 2021-03-18 LAB — URINE CULTURE

## 2021-03-20 ENCOUNTER — Other Ambulatory Visit: Payer: Self-pay | Admitting: Hematology

## 2021-03-20 ENCOUNTER — Other Ambulatory Visit: Payer: Self-pay | Admitting: Family Medicine

## 2021-03-20 DIAGNOSIS — C9000 Multiple myeloma not having achieved remission: Secondary | ICD-10-CM

## 2021-03-29 ENCOUNTER — Telehealth: Payer: Self-pay

## 2021-03-29 NOTE — Progress Notes (Addendum)
Chronic Care Management Pharmacy Assistant   Name: Tracy [name]  MRN: [id number] DOB: [birth year]   Reason for Encounter: Disease State - Hypertension Call / Schedule [name] Follow up    Recent office visits:  None noted.   Recent consult visits:  03/08/21 Wilber Bihari, NP - Oncology - Multiple Myeloma - meclizine (ANTIVERT) 25 MG tablet ordered. Decreasedher losartan from 152m daily to 552mdaily. Follow up in 4 weeks.  Hospital visits: 03/16/21   Medication Reconciliation was completed by comparing discharge summary, patient's EMR and Pharmacy list, and upon discussion with patient.  Admitted to the hospital on 03/16/21 due to Generalized weakness. Discharge date was 03/19/21. [facility] noted.  Medication Changes at Hospital Discharge: No medication changes noted.   Medications Discontinued at Hospital Discharge: No medications were discontinued at discharge.  Medications that remain the same after Hospital Discharge:??  All other medications will remain the same.    Hospital visits: 03/15/21  Medication Reconciliation was completed by comparing discharge summary, patient's EMR and Pharmacy list, and upon discussion with patient.  Admitted to the hospital on 03/15/21 due to Enteroscopy. Discharge date was 03/15/21. [facility] No new [facility] noted.  Medication Changes at Hospital Discharge: No medication changes noted.  Medications Discontinued at Hospital Discharge: No medications were discontinued at discharge.  Medications that remain the same after Hospital Discharge:??  All other medications will remain the same.   Medications: Outpatient Encounter Medications as of 03/29/2021  Medication Sig Note   Accu-Chek Softclix Lancets lancets Use as instructed to check sugars 1-2 times  daily.    acyclovir (ZOVIRAX) 400 MG tablet TAKE 1 TABLET BY MOUTH  TWICE DAILY    albuterol (VENTOLIN HFA) 108 (90 Base) MCG/ACT inhaler Inhale 2 puffs into the lungs every 6 (six) hours as needed for wheezing or shortness of breath.    atorvastatin (LIPITOR) 10 MG tablet TAKE 1 TABLET BY MOUTH  DAILY    B Complex-C (B-COMPLEX WITH VITAMIN C) tablet Take 1 tablet by mouth in the morning.    dexamethasone (DECADRON) 4 MG tablet Take 3 tabs with breakfast the day after each treatment.    glucose blood (ACCU-CHEK GUIDE) test strip Use as instructed to check sugars 1-2 times daily.    lidocaine-prilocaine (EMLA) cream APPLY 1 APPLICATION TOPICALLY AS NEEDED. (Patient taking differently: Apply 1 application topically as needed (prior to treatments).)    loratadine (CLARITIN) 10 MG tablet Take 10 mg by mouth daily.    losartan (COZAAR) 100 MG tablet TAKE 1 TABLET BY MOUTH  DAILY    meclizine (ANTIVERT) 25 MG tablet Take 1 tablet (25 mg total) by mouth 3 (three) times daily as needed for dizziness.    metFORMIN (GLUCOPHAGE) 500 MG tablet TAKE 1 TABLET(500 MG) BY MOUTH TWICE DAILY WITH A MEAL    mometasone (NASONEX) 50 MCG/ACT nasal spray Place 2 sprays into the nose daily. (Patient taking differently: Place 2 sprays into the nose daily as needed (allergies.).) 08/29/2020: Changed to as needed   Multiple Vitamin (MULTIVITAMIN WITH MINERALS) TABS tablet Take 1 tablet by mouth daily. Centrum for Women 50+    ondansetron (ZOFRAN) 8 MG tablet Take 1 tablet (8 mg total) by mouth 2 (two) times daily as needed (Nausea or vomiting).    pantoprazole (PROTONIX) 40 MG tablet TAKE 1 TABLET BY MOUTH  TWICE  DAILY (Patient taking differently: TAKE 1 TABLET BY MOUTH  TWICE DAILY)    potassium chloride SA (KLOR-CON) 20 MEQ tablet TAKE 1 TABLET(20 MEQ) BY MOUTH TWICE DAILY    pregabalin (LYRICA) 50 MG capsule TAKE 1 CAPSULE(50 MG) BY MOUTH THREE TIMES DAILY    prochlorperazine (COMPAZINE) 10 MG tablet Take 1 tablet (10 mg  total) by mouth every 6 (six) hours as needed (Nausea or vomiting).    tiZANidine (ZANAFLEX) 4 MG tablet TAKE 1 TABLET(4 MG) BY MOUTH AT BEDTIME    traZODone (DESYREL) 100 MG tablet TAKE 1 TABLET BY MOUTH AT  BEDTIME    Vitamin D, Ergocalciferol, (DRISDOL) 1.25 MG (50000 UNIT) CAPS capsule TAKE 1 CAPSULE BY MOUTH 1 TIME A WEEK (Patient taking differently: Take 50,000 Units by mouth every Sunday.)    No facility-administered encounter medications on file as of 03/29/2021.    Current antihypertensive regimen:  Losartan 100 mg daily   How often are you checking your Blood Pressure?  Patient has been checking it daily except she has not checked it the last few days. She did start back to Losartan 100 mg daily after discharge 1 week ago and doing well.   Current home BP readings: 134/73 on Sunday    What recent interventions/DTPs have been made by any provider to improve Blood Pressure control since last [name] Visit:  Patient has recently went back to Losartan 100 mg from 50 mg 1 week ago . Her blood pressure had been low and patient did have a hospitalization in regards to this. Her blood pressures have improved since her discharge although no explanation was found for it. She reports her blood pressure has not been low since discharge and she has resumed Losartan 100 mg daily with no issues.    Any recent hospitalizations or ED visits since last visit with [name]?  Patient did have hospitalizations on 03/15/21 and 03/16/21 - 03/19/21   What diet changes have been made to improve Blood Pressure Control?  Patient reported she is watching her salt intake.   What exercise is being done to improve your Blood Pressure Control?  Patient reported she is active as she is able. She is currently receiving treatments at the Oncologist and someday's she is more fatigued.    Adherence Review: Is the patient currently on ACE/ARB medication? Yes Does the patient have >5 day gap between last estimated fill  dates? Yes  Losartan (COZAAR) 100 MG tablet - last filled 12/13/20 90 days    Star Rating Drugs: Atorvastatin (LIPITOR) 10 MG tablet - last filled 12/21/20 90 days  MetFORMIN (GLUCOPHAGE) 500 MG tablet - last filled 12/21/20 90 days  Losartan (COZAAR) 100 MG tablet - last filled 03/15/21 90 days   Patient is awaiting a refill on her Metformin and Atorvastatin but has enough since her hospitalization and will be picking it up this week.   Patient scheduled for Follow up [name] phone visit for : October 5th 2022 @ 3:30 pm. Patient confirmed appointment date and time.   Future Appointments  Date Time Provider [place]  04/04/2021  2:45 PM [facility] FLUSH [facility] None  04/04/2021  3:20 PM [name], MD [facility] None  04/05/2021 10:00 AM [facility] INFUSION [facility] None  04/09/2021  9:30 AM [name], MD [facility]  05/02/2021  3:30 PM [facility] PHARMACIST [facility]  05/31/2021 10:00 AM [facility] INFUSION [facility] None  07/26/2021 10:00 AM [facility] INFUSION [facility] None  09/20/2021 10:00 AM [facility] INFUSION  [place] None     [name], CCMA Clinical Pharmacist Assistant  [phone]  Time Spent: 40 minutes

## 2021-04-01 ENCOUNTER — Other Ambulatory Visit: Payer: Self-pay | Admitting: Family Medicine

## 2021-04-03 ENCOUNTER — Other Ambulatory Visit: Payer: Self-pay

## 2021-04-03 DIAGNOSIS — C9000 Multiple myeloma not having achieved remission: Secondary | ICD-10-CM

## 2021-04-04 ENCOUNTER — Other Ambulatory Visit: Payer: Self-pay

## 2021-04-04 ENCOUNTER — Inpatient Hospital Stay: Payer: Medicare Other

## 2021-04-04 ENCOUNTER — Inpatient Hospital Stay: Payer: Medicare Other | Attending: Hematology | Admitting: Hematology

## 2021-04-04 VITALS — BP 118/55 | HR 87 | Temp 98.5°F | Resp 18 | Wt 189.8 lb

## 2021-04-04 DIAGNOSIS — E1122 Type 2 diabetes mellitus with diabetic chronic kidney disease: Secondary | ICD-10-CM | POA: Insufficient documentation

## 2021-04-04 DIAGNOSIS — Z79899 Other long term (current) drug therapy: Secondary | ICD-10-CM | POA: Insufficient documentation

## 2021-04-04 DIAGNOSIS — E1140 Type 2 diabetes mellitus with diabetic neuropathy, unspecified: Secondary | ICD-10-CM | POA: Diagnosis not present

## 2021-04-04 DIAGNOSIS — C9000 Multiple myeloma not having achieved remission: Secondary | ICD-10-CM | POA: Diagnosis not present

## 2021-04-04 DIAGNOSIS — E538 Deficiency of other specified B group vitamins: Secondary | ICD-10-CM | POA: Insufficient documentation

## 2021-04-04 DIAGNOSIS — Z87891 Personal history of nicotine dependence: Secondary | ICD-10-CM | POA: Insufficient documentation

## 2021-04-04 DIAGNOSIS — N189 Chronic kidney disease, unspecified: Secondary | ICD-10-CM | POA: Insufficient documentation

## 2021-04-04 DIAGNOSIS — Z853 Personal history of malignant neoplasm of breast: Secondary | ICD-10-CM | POA: Insufficient documentation

## 2021-04-04 DIAGNOSIS — Z8673 Personal history of transient ischemic attack (TIA), and cerebral infarction without residual deficits: Secondary | ICD-10-CM | POA: Diagnosis not present

## 2021-04-04 DIAGNOSIS — Z5112 Encounter for antineoplastic immunotherapy: Secondary | ICD-10-CM | POA: Diagnosis not present

## 2021-04-04 DIAGNOSIS — I252 Old myocardial infarction: Secondary | ICD-10-CM | POA: Diagnosis not present

## 2021-04-04 DIAGNOSIS — Z923 Personal history of irradiation: Secondary | ICD-10-CM | POA: Insufficient documentation

## 2021-04-04 DIAGNOSIS — Z95828 Presence of other vascular implants and grafts: Secondary | ICD-10-CM

## 2021-04-04 LAB — CMP (CANCER CENTER ONLY)
ALT: 16 U/L (ref 0–44)
AST: 19 U/L (ref 15–41)
Albumin: 3.5 g/dL (ref 3.5–5.0)
Alkaline Phosphatase: 80 U/L (ref 38–126)
Anion gap: 11 (ref 5–15)
BUN: 19 mg/dL (ref 8–23)
CO2: 21 mmol/L — ABNORMAL LOW (ref 22–32)
Calcium: 9.4 mg/dL (ref 8.9–10.3)
Chloride: 110 mmol/L (ref 98–111)
Creatinine: 1.69 mg/dL — ABNORMAL HIGH (ref 0.44–1.00)
GFR, Estimated: 31 mL/min — ABNORMAL LOW (ref >60)
Glucose, Bld: 111 mg/dL — ABNORMAL HIGH (ref 70–99)
Potassium: 4.7 mmol/L (ref 3.5–5.1)
Sodium: 142 mmol/L (ref 135–145)
Total Bilirubin: 0.2 mg/dL — ABNORMAL LOW (ref 0.3–1.2)
Total Protein: 6.5 g/dL (ref 6.5–8.1)

## 2021-04-04 LAB — CBC WITH DIFFERENTIAL/PLATELET
Abs Immature Granulocytes: 0.02 10*3/uL (ref 0.00–0.07)
Basophils Absolute: 0 10*3/uL (ref 0.0–0.1)
Basophils Relative: 1 %
Eosinophils Absolute: 0.1 10*3/uL (ref 0.0–0.5)
Eosinophils Relative: 1 %
HCT: 26.6 % — ABNORMAL LOW (ref 36.0–46.0)
Hemoglobin: 8.8 g/dL — ABNORMAL LOW (ref 12.0–15.0)
Immature Granulocytes: 0 %
Lymphocytes Relative: 32 %
Lymphs Abs: 1.7 10*3/uL (ref 0.7–4.0)
MCH: 31.5 pg (ref 26.0–34.0)
MCHC: 33.1 g/dL (ref 30.0–36.0)
MCV: 95.3 fL (ref 80.0–100.0)
Monocytes Absolute: 0.5 10*3/uL (ref 0.1–1.0)
Monocytes Relative: 9 %
Neutro Abs: 3 10*3/uL (ref 1.7–7.7)
Neutrophils Relative %: 57 %
Platelets: 274 10*3/uL (ref 150–400)
RBC: 2.79 MIL/uL — ABNORMAL LOW (ref 3.87–5.11)
RDW: 14.3 % (ref 11.5–15.5)
WBC: 5.2 10*3/uL (ref 4.0–10.5)
nRBC: 0 % (ref 0.0–0.2)

## 2021-04-04 MED ORDER — HEPARIN SOD (PORK) LOCK FLUSH 100 UNIT/ML IV SOLN
500.0000 [IU] | Freq: Once | INTRAVENOUS | Status: AC
  Administered 2021-04-04: 500 [IU] via INTRAVENOUS

## 2021-04-04 MED ORDER — SODIUM CHLORIDE 0.9% FLUSH
10.0000 mL | Freq: Once | INTRAVENOUS | Status: AC
  Administered 2021-04-04: 10 mL via INTRAVENOUS

## 2021-04-05 ENCOUNTER — Telehealth: Payer: Self-pay | Admitting: Hematology

## 2021-04-05 ENCOUNTER — Inpatient Hospital Stay: Payer: Medicare Other

## 2021-04-05 ENCOUNTER — Other Ambulatory Visit: Payer: Self-pay | Admitting: Hematology

## 2021-04-05 VITALS — BP 125/59 | HR 76 | Temp 98.4°F | Resp 16

## 2021-04-05 DIAGNOSIS — C9000 Multiple myeloma not having achieved remission: Secondary | ICD-10-CM | POA: Diagnosis not present

## 2021-04-05 DIAGNOSIS — Z853 Personal history of malignant neoplasm of breast: Secondary | ICD-10-CM | POA: Diagnosis not present

## 2021-04-05 DIAGNOSIS — C7951 Secondary malignant neoplasm of bone: Secondary | ICD-10-CM

## 2021-04-05 DIAGNOSIS — E538 Deficiency of other specified B group vitamins: Secondary | ICD-10-CM | POA: Diagnosis not present

## 2021-04-05 DIAGNOSIS — Z923 Personal history of irradiation: Secondary | ICD-10-CM | POA: Diagnosis not present

## 2021-04-05 DIAGNOSIS — Z87891 Personal history of nicotine dependence: Secondary | ICD-10-CM | POA: Diagnosis not present

## 2021-04-05 DIAGNOSIS — Z79899 Other long term (current) drug therapy: Secondary | ICD-10-CM | POA: Diagnosis not present

## 2021-04-05 DIAGNOSIS — I252 Old myocardial infarction: Secondary | ICD-10-CM | POA: Diagnosis not present

## 2021-04-05 DIAGNOSIS — Z8673 Personal history of transient ischemic attack (TIA), and cerebral infarction without residual deficits: Secondary | ICD-10-CM | POA: Diagnosis not present

## 2021-04-05 DIAGNOSIS — E1140 Type 2 diabetes mellitus with diabetic neuropathy, unspecified: Secondary | ICD-10-CM | POA: Diagnosis not present

## 2021-04-05 DIAGNOSIS — Z7189 Other specified counseling: Secondary | ICD-10-CM

## 2021-04-05 DIAGNOSIS — E1122 Type 2 diabetes mellitus with diabetic chronic kidney disease: Secondary | ICD-10-CM | POA: Diagnosis not present

## 2021-04-05 DIAGNOSIS — N189 Chronic kidney disease, unspecified: Secondary | ICD-10-CM | POA: Diagnosis not present

## 2021-04-05 DIAGNOSIS — Z5112 Encounter for antineoplastic immunotherapy: Secondary | ICD-10-CM | POA: Diagnosis not present

## 2021-04-05 MED ORDER — SODIUM CHLORIDE 0.9 % IV SOLN
16.0000 mg/kg | Freq: Once | INTRAVENOUS | Status: AC
Start: 2021-04-05 — End: 2021-04-05
  Administered 2021-04-05: 1400 mg via INTRAVENOUS
  Filled 2021-04-05: qty 60

## 2021-04-05 MED ORDER — SODIUM CHLORIDE 0.9 % IV SOLN
60.0000 mg | Freq: Once | INTRAVENOUS | Status: AC
  Administered 2021-04-05: 60 mg via INTRAVENOUS
  Filled 2021-04-05: qty 20

## 2021-04-05 MED ORDER — SODIUM CHLORIDE 0.9 % IV SOLN
60.0000 mg | Freq: Once | INTRAVENOUS | Status: DC
  Filled 2021-04-05: qty 10

## 2021-04-05 MED ORDER — SODIUM CHLORIDE 0.9% FLUSH
10.0000 mL | INTRAVENOUS | Status: DC | PRN
  Administered 2021-04-05: 10 mL

## 2021-04-05 MED ORDER — FAMOTIDINE 20 MG IN NS 100 ML IVPB
20.0000 mg | Freq: Once | INTRAVENOUS | Status: AC
  Administered 2021-04-05: 20 mg via INTRAVENOUS
  Filled 2021-04-05: qty 100

## 2021-04-05 MED ORDER — CYANOCOBALAMIN 1000 MCG/ML IJ SOLN
1000.0000 ug | Freq: Once | INTRAMUSCULAR | Status: AC
  Administered 2021-04-05: 1000 ug via INTRAMUSCULAR
  Filled 2021-04-05: qty 1

## 2021-04-05 MED ORDER — ACETAMINOPHEN 325 MG PO TABS
650.0000 mg | ORAL_TABLET | Freq: Once | ORAL | Status: AC
  Administered 2021-04-05: 650 mg via ORAL
  Filled 2021-04-05: qty 2

## 2021-04-05 MED ORDER — DIPHENHYDRAMINE HCL 25 MG PO CAPS
50.0000 mg | ORAL_CAPSULE | Freq: Once | ORAL | Status: AC
  Administered 2021-04-05: 50 mg via ORAL
  Filled 2021-04-05: qty 2

## 2021-04-05 MED ORDER — SODIUM CHLORIDE 0.9 % IV SOLN: Freq: Once | INTRAVENOUS | Status: AC

## 2021-04-05 MED ORDER — HEPARIN SOD (PORK) LOCK FLUSH 100 UNIT/ML IV SOLN
500.0000 [IU] | Freq: Once | INTRAVENOUS | Status: AC | PRN
Start: 2021-04-05 — End: 2021-04-05
  Administered 2021-04-05: 500 [IU]

## 2021-04-05 MED ORDER — SODIUM CHLORIDE 0.9 % IV SOLN
20.0000 mg | Freq: Once | INTRAVENOUS | Status: AC
  Administered 2021-04-05: 20 mg via INTRAVENOUS
  Filled 2021-04-05: qty 20

## 2021-04-05 NOTE — Patient Instructions (Signed)
[facility]  Discharge Instructions: Thank you for choosing [facility] to provide your oncology and hematology care.   If you have a lab appointment with the [facility], please go directly to the [facility] and check in at the registration area.   Wear comfortable clothing and clothing appropriate for easy access to any Portacath or PICC line.   We strive to give you quality time with your provider. You may need to reschedule your appointment if you arrive late (15 or more minutes).  Arriving late affects you and other patients whose appointments are after yours.  Also, if you miss three or more appointments without notifying the office, you may be dismissed from the clinic at the provider's discretion.      For prescription refill requests, have your pharmacy contact our office and allow 72 hours for refills to be completed.    Today you received the following chemotherapy and/or immunotherapy agents darzalex      To help prevent nausea and vomiting after your treatment, we encourage you to take your nausea medication as directed.  BELOW ARE SYMPTOMS THAT SHOULD BE REPORTED IMMEDIATELY: *FEVER GREATER THAN 100.4 F (38 C) OR HIGHER *CHILLS OR SWEATING *NAUSEA AND VOMITING THAT IS NOT CONTROLLED WITH YOUR NAUSEA MEDICATION *UNUSUAL SHORTNESS OF BREATH *UNUSUAL BRUISING OR BLEEDING *URINARY PROBLEMS (pain or burning when urinating, or frequent urination) *BOWEL PROBLEMS (unusual diarrhea, constipation, pain near the anus) TENDERNESS IN MOUTH AND THROAT WITH OR WITHOUT PRESENCE OF ULCERS (sore throat, sores in mouth, or a toothache) UNUSUAL RASH, SWELLING OR PAIN  UNUSUAL VAGINAL DISCHARGE OR ITCHING   Items with * indicate a potential emergency and should be followed up as soon as possible or go to the Emergency Department if any problems should occur.  Please show the CHEMOTHERAPY ALERT CARD or IMMUNOTHERAPY ALERT CARD at check-in to  the Emergency Department and triage nurse.  Should you have questions after your visit or need to cancel or reschedule your appointment, please contact [facility]  Dept: [phone]  and follow the prompts.  Office hours are 8:00 a.m. to 4:30 p.m. Monday - Friday. Please note that voicemails left after 4:00 p.m. may not be returned until the following business day.  We are closed weekends and major holidays. You have access to a nurse at all times for urgent questions. Please call the main number to the clinic Dept: [phone] and follow the prompts.   For any non-urgent questions, you may also contact your provider using MyChart. We now offer e-Visits for anyone 18 and older to request care online for non-urgent symptoms. For details visit [email or web address].   Also download the MyChart app! Go to the app store, search "MyChart", open the app, select [facility], and log in with your MyChart username and password.  Due to Covid, a mask is required upon entering the hospital/clinic. If you do not have a mask, one will be given to you upon arrival. For doctor visits, patients may have 1 support person aged 43 or older with them. For treatment visits, patients cannot have anyone with them due to current Covid guidelines and our immunocompromised population.   Pamidronate Injection What is this medication? PAMIDRONATE (pa mi DROE nate) slows calcium loss from bones. It treats Paget's disease and high calcium levels in the blood from some kinds of cancer. It may be used in other people at risk for bone loss. This medicine may be used  for other purposes; ask your health care provider or pharmacist if you have questions. COMMON BRAND NAME(S): Aredia What should I tell my care team before I take this medication? They need to know if you have any of these conditions: bleeding disorder cancer dental disease kidney disease low levels of calcium or other minerals in  the blood low red blood cell counts receiving steroids like dexamethasone or prednisone an unusual or allergic reaction to pamidronate, other drugs, foods, dyes or preservatives pregnant or trying to get pregnant breast-feeding How should I use this medication? This drug is injected into a vein. It is given by a health care provider in a hospital or clinic setting. Talk to your health care provider about the use of this drug in children. Special care may be needed. Overdosage: If you think you have taken too much of this medicine contact a poison control center or emergency room at once. NOTE: This medicine is only for you. Do not share this medicine with others. What if I miss a dose? Keep appointments for follow-up doses. It is important not to miss your dose. Call your health care provider if you are unable to keep an appointment. What may interact with this medication? certain antibiotics given by injection medicines for inflammation or pain like ibuprofen, naproxen some diuretics like bumetanide, furosemide cyclosporine parathyroid hormone tacrolimus teriparatide thalidomide This list may not describe all possible interactions. Give your health care provider a list of all the medicines, herbs, non-prescription drugs, or dietary supplements you use. Also tell them if you smoke, drink alcohol, or use illegal drugs. Some items may interact with your medicine. What should I watch for while using this medication? Visit your health care provider for regular checks on your progress. It may be some time before you see the benefit from this drug. Some people who take this drug have severe bone, joint, or muscle pain. This drug may also increase your risk for jaw problems or a broken thigh bone. Tell your health care provider right away if you have severe pain in your jaw, bones, joints, or muscles. Tell you health care provider if you have any pain that does not go away or that gets worse. Tell  your dentist and dental surgeon that you are taking this drug. You should not have major dental surgery while on this drug. See your dentist to have a dental exam and fix any dental problems before starting this drug. Take good care of your teeth while on this drug. Make sure you see your dentist for regular follow-up appointments. You should make sure you get enough calcium and vitamin D while you are taking this drug. Discuss the foods you eat and the vitamins you take with your health care provider. You may need blood work done while you are taking this drug. Do not become pregnant while taking this drug. Women should inform their health care provider if they wish to become pregnant or think they might be pregnant. There is potential for serious harm to an unborn child. Talk to your health care provider for more information. What side effects may I notice from receiving this medication? Side effects that you should report to your doctor or health care provider as soon as possible: allergic reactions (skin rash, itching or hives; swelling of the face, lips, or tongue) bleeding (bloody or black, tarry stools; red or dark brown urine; spitting up blood or brown material that looks like coffee grounds; red spots on the skin; unusual bruising  or bleeding from the eyes, gums, or nose) bone pain increased thirst infection (fever, chills, cough, sore throat, pain or trouble passing urine) jaw pain, especially after dental work joint pain kidney injury (trouble passing urine or change in the amount of urine) low calcium levels (fast heartbeat; muscle cramps or pain; pain, tingling, or numbness in the hands or feet; seizures) low magnesium levels (fast, irregular heartbeat; muscle cramp or pain; muscle weakness; tremors; seizures) low potassium levels (trouble breathing; chest pain; dizziness; fast, irregular heartbeat; feeling faint or lightheaded, falls; muscle cramps or pain) muscle pain pain, redness,  or irritation at site where injected redness, blistering, peeling, or loosening of the skin, including inside the mouth severe diarrhea unusual sweating Side effects that usually do not require medical attention (report to your doctor or health care provider if they continue or are bothersome): constipation eye irritation, itching, or pain fever headache increase in blood pressure loss of appetite nausea stomach pain unusually weak or tired vomiting This list may not describe all possible side effects. Call your doctor for medical advice about side effects. You may report side effects to FDA at 1-800-FDA-1088. Where should I keep my medication? This drug is given in a hospital or clinic. It will not be stored at home. NOTE: This sheet is a summary. It may not cover all possible information. If you have questions about this medicine, talk to your doctor, pharmacist, or health care provider.  2022 Elsevier/Gold Standard (2019-06-07 12:19:52)

## 2021-04-05 NOTE — Telephone Encounter (Signed)
Left message with follow-up appointments per 9/7 los. 

## 2021-04-09 ENCOUNTER — Telehealth: Payer: Self-pay

## 2021-04-09 ENCOUNTER — Ambulatory Visit: Payer: Medicare Other | Admitting: Family Medicine

## 2021-04-09 NOTE — Telephone Encounter (Signed)
Returned call to pt . Pt states she has been feeling bad, shaky and dizzy when she stands. Pt has PCP appointment in am. Pt is monitoring her BP, Pt states her neck is stiff. Encouraged pt to test for Covid at home to rule it out. Pt agreed. Pt will go to ED if she feels worse over the next few hours. Pt to call with update tomorrow.

## 2021-04-10 ENCOUNTER — Encounter: Payer: Self-pay | Admitting: Family Medicine

## 2021-04-10 ENCOUNTER — Ambulatory Visit (INDEPENDENT_AMBULATORY_CARE_PROVIDER_SITE_OTHER): Payer: Medicare Other | Admitting: Family Medicine

## 2021-04-10 ENCOUNTER — Other Ambulatory Visit: Payer: Self-pay

## 2021-04-10 VITALS — BP 132/68 | HR 91 | Temp 97.3°F | Resp 16 | Ht 62.0 in | Wt 186.2 lb

## 2021-04-10 DIAGNOSIS — R350 Frequency of micturition: Secondary | ICD-10-CM | POA: Diagnosis not present

## 2021-04-10 DIAGNOSIS — E785 Hyperlipidemia, unspecified: Secondary | ICD-10-CM | POA: Diagnosis not present

## 2021-04-10 DIAGNOSIS — R5383 Other fatigue: Secondary | ICD-10-CM

## 2021-04-10 DIAGNOSIS — E1140 Type 2 diabetes mellitus with diabetic neuropathy, unspecified: Secondary | ICD-10-CM | POA: Diagnosis not present

## 2021-04-10 DIAGNOSIS — I10 Essential (primary) hypertension: Secondary | ICD-10-CM

## 2021-04-10 LAB — BASIC METABOLIC PANEL
BUN: 18 mg/dL (ref 6–23)
CO2: 24 mEq/L (ref 19–32)
Calcium: 9.5 mg/dL (ref 8.4–10.5)
Chloride: 105 mEq/L (ref 96–112)
Creatinine, Ser: 1.51 mg/dL — ABNORMAL HIGH (ref 0.40–1.20)
GFR: 33.25 mL/min — ABNORMAL LOW (ref >60.00)
Glucose, Bld: 118 mg/dL — ABNORMAL HIGH (ref 70–99)
Potassium: 4.4 mEq/L (ref 3.5–5.1)
Sodium: 142 mEq/L (ref 135–145)

## 2021-04-10 LAB — B12 AND FOLATE PANEL
Folate: >24.4 ng/mL (ref >5.9)
Vitamin B-12: >1550 pg/mL — ABNORMAL HIGH (ref 211–911)

## 2021-04-10 LAB — LIPID PANEL
Cholesterol: 188 mg/dL (ref 0–200)
HDL: 79.2 mg/dL (ref >39.00)
LDL Cholesterol: 75 mg/dL (ref 0–99)
NonHDL: 108.44
Total CHOL/HDL Ratio: 2
Triglycerides: 167 mg/dL — ABNORMAL HIGH (ref 0.0–149.0)
VLDL: 33.4 mg/dL (ref 0.0–40.0)

## 2021-04-10 LAB — HEPATIC FUNCTION PANEL
ALT: 15 U/L (ref 0–35)
AST: 14 U/L (ref 0–37)
Albumin: 4.2 g/dL (ref 3.5–5.2)
Alkaline Phosphatase: 72 U/L (ref 39–117)
Bilirubin, Direct: 0.1 mg/dL (ref 0.0–0.3)
Total Bilirubin: 0.4 mg/dL (ref 0.2–1.2)
Total Protein: 6.8 g/dL (ref 6.0–8.3)

## 2021-04-10 LAB — HEMOGLOBIN A1C: Hgb A1c MFr Bld: 7.1 % — ABNORMAL HIGH (ref 4.6–6.5)

## 2021-04-10 LAB — TSH: TSH: 2.23 u[IU]/mL (ref 0.35–5.50)

## 2021-04-10 LAB — VITAMIN D 25 HYDROXY (VIT D DEFICIENCY, FRACTURES): VITD: 71.18 ng/mL (ref 30.00–100.00)

## 2021-04-10 NOTE — Progress Notes (Signed)
   Subjective:    Patient ID: Tracy [name], female    DOB: Jan 07, 1946, 75 y.o.   MRN: [id number]  HPI HTN- chronic problem, on Losartan '100mg'$  daily.  She has been taking 1/2 tab but she did not take this morning at the advice of Dr Grier Mitts nurse bc she has been feeling poorly and has had episodes of low BP.    DM- chronic problem, on Metformin '500mg'$  BID.  On ARB for renal protection.  Due for foot exam/eye exam.  + intermittent nausea- has medication but doesn't take.  Feels internally 'shaky'.  Not checking sugars at home.  Pt reports some visual changes.  + fatigue.  + urinary frequency- pt had abnormal UA in ER last month but no culture done.  Hyperlipidemia- chronic problem, on Lipitor '10mg'$  nightly.  + nausea.   Review of Systems For ROS see HPI   This visit occurred during the SARS-CoV-2 public health emergency.  Safety protocols were in place, including screening questions prior to the visit, additional usage of staff PPE, and extensive cleaning of exam room while observing appropriate contact time as indicated for disinfecting solutions.      Objective:   Physical Exam Vitals reviewed.  Constitutional:      General: She is not in acute distress.    Appearance: Normal appearance. She is well-developed. She is not ill-appearing.  HENT:     Head: Normocephalic and atraumatic.  Eyes:     Conjunctiva/sclera: Conjunctivae normal.     Pupils: Pupils are equal, round, and reactive to light.  Neck:     Thyroid: No thyromegaly.  Cardiovascular:     Rate and Rhythm: Normal rate and regular rhythm.     Pulses: Normal pulses.     Heart sounds: Normal heart sounds. No murmur heard. Pulmonary:     Effort: Pulmonary effort is normal. No respiratory distress.     Breath sounds: Normal breath sounds.  Abdominal:     General: There is no distension.     Palpations: Abdomen is soft.     Tenderness: There is no abdominal tenderness.  Musculoskeletal:     Cervical back: Normal range of  motion and neck supple.     Right lower leg: No edema.     Left lower leg: No edema.  Lymphadenopathy:     Cervical: No cervical adenopathy.  Skin:    General: Skin is warm and dry.  Neurological:     Mental Status: She is alert and oriented to person, place, and time.  Psychiatric:        Behavior: Behavior normal.          Assessment & Plan:  Urinary frequency- new.  Pt had abnormal UA in ER in August but no culture was sent.  Given her fatigue and feeling poorly will send urine culture and treat if needed.

## 2021-04-10 NOTE — Patient Instructions (Addendum)
Follow up in 1 month to recheck blood pressure We'll notify you of your lab results and make any changes if needed STOP your Losartan Make sure you are drinking plenty of water to keep your blood pressure up and avoid dizziness Make sure you are eating regularly to avoid your sugar dropping low If you have nausea, make sure you take your Ondansetron (Zofran) Call with any questions or concerns Hang in there!

## 2021-04-11 ENCOUNTER — Encounter: Payer: Self-pay | Admitting: Hematology

## 2021-04-11 NOTE — Progress Notes (Signed)
[facility] Follow up:    Tracy Minium, MD [address] [place] [address]   DIAGNOSIS: Cancer Staging History of right breast cancer Staging form: Breast, AJCC 7th Edition - Clinical stage from 06/13/2015: Stage IA (T1b, N0, M0) - Unsigned Laterality: Right - Pathologic stage from 07/10/2015: Stage IA (T1c, N0, cM0) - Unsigned Staged by: Pathologist Laterality: Right Estrogen receptor status: Positive Progesterone receptor status: Positive HER2 status: Negative Stage used in treatment planning: Yes National guidelines used in treatment planning: Yes Type of national guideline used in treatment planning: NCCN Staging comments: Staged on surgical specimen by Dr. Lyndon Code   SUMMARY OF ONCOLOGIC HISTORY: Oncology History  History of right breast cancer  04/16/2000 Surgery   Left breast: Triple negative  invasive ductal cancer treated with lumpectomy, adjuvant chemotherapy, radiation , in [place], unknown stage   06/07/2015 Mammogram   Right breast mass 6x 6 x 5 mm, right axillary lymph node with slight cortex thickening measured 5 mm    06/13/2015 Initial Diagnosis   Right breast needle biopsy: Invasive ductal carcinoma, grade 1, right axillary lymph node biopsy negative , ER 95%, PR 5%, Ki-67 10%, HER-2 negative   06/13/2015 Clinical Stage   Stage IA: T1b N0   07/07/2015 Surgery   Right lumpectomy: Invasive ductal carcinoma grade 1, 1 cm span, with low-grade DCIS, DCIS focally 0.1 cm to inferior margin, 0/3 lymph nodes negative, ER 95%, PR 5%, HER-2 negative ratio 1.1, Ki-67 10%   07/07/2015 Pathologic Stage   Stage IA: T1c N0   07/13/2015 Procedure   Breast High/Moderate Risk Panel reveals no clinically significant variant at ATM, BRCA1, BRCA2, CDH1, CHEK2, PALB2, PTEN, and TP53.     08/23/2015 - 09/21/2015 Radiation Therapy   Adjuvant Radiation: Right breast/ 42.5Gy at 2.5 Gy per fraction x 17 fractions.   Right breast boost/ 7.5 Gy at  2.5 Gy per fraction x 3 fractions    Anti-estrogen oral therapy   Patient refused antiestrogen therapy   10/20/2015 Survivorship   Survivorship care plan completed and mailed to patient in lieu of in person visit at her request   Iron deficiency anemia  Multiple myeloma ([place])  01/25/2020 Initial Diagnosis   Multiple myeloma ([place])   02/11/2020 - 05/19/2020 Adjuvant Chemotherapy   Daratumumab Weekly    02/29/2020 - 03/13/2020 Radiation Therapy   The targets were treated to a total dose of 20 Gy in 10 fractions of 2 Gy each to the left hip and L5 using one plan.   06/02/2020 - 10/05/2020 Adjuvant Chemotherapy   Daratumumab and Carfilzomib   10/19/2020 -  Adjuvant Chemotherapy   Maintenance Daratumumab--initially every 2 weeks, then every 4 weeks.    Bone metastases ([place])  02/08/2021 Initial Diagnosis   Bone metastases ([facility])     CURRENT THERAPY: Daratumumab/B12 injection, Pamidronate  INTERVAL HISTORY: Tracy [name] 75 y.o. female returns for evaluation of her multiple myeloma prior to receiving her every 4 week Daratumumab.   She notes no issues with her previous dose of daratumumab and no other acute new symptoms since her last visit. Notes no other acute new symptoms No infection issues no fevers no chills no night sweats.  Notes her blood pressures have been running somewhat lower in the context of decreased p.o. fluid intake and being on antihypertensives.  She was asked to coordinate adjusting her antihypertensives with her primary care physician to avoid significant drops in blood pressure.  Blood pressure today is  stable and patient is not dizzy.  She does note that she will be drinking more water. Denies any overt bleeding including GI bleeding.  Labs today 04/04/2021 show CBC with a stable hemoglobin of 8.8 normal WBC count and platelets CMP stable with a creatinine of 1.69 Last myeloma panel on 03/08/2021 showed an M protein of 0.1 g/dL   Patient Active Problem List    Diagnosis Date Noted   Bone metastases ([place]) 02/08/2021   Angiodysplasia of intestine 08/23/2020   Port-A-Cath in place 08/04/2020   Counseling regarding advance care planning and goals of care 02/07/2020   Multiple myeloma ([facility]) 01/25/2020   Dizziness 10/12/2019   Post-nasal drainage 10/12/2019   Allergic rhinitis 08/19/2018   Type 2 diabetes mellitus with diabetic neuropathy, unspecified ([place] [facility]) 11/05/2017   Encounter for long-term use of muscle relaxants 09/24/2017   CAD in native artery 09/24/2017   OSA (obstructive sleep apnea) 09/24/2017   [place] 09/24/2017   Asthenia 06/30/2017   Morbid obesity ([place]) 06/02/2017   Depression 06/02/2017   Iron deficiency anemia 09/23/2016   Anemia of chronic disease 09/28/2015   Genetic testing 08/21/2015   History of left breast cancer 07/13/2015   History of right breast cancer 06/20/2015   Hearing loss due to cerumen impaction 12/29/2014   Allergy to adhesive tape 05/19/2014   Hyperlipidemia 12/06/2013   GERD (gastroesophageal reflux disease) 12/06/2013   Cervical disc disease 11/11/2013   Osteopenia 05/25/2013   Allergic asthma 12/24/2012   Insomnia 04/02/2012   Physical exam, annual 04/02/2012   HTN (hypertension) 02/03/2012   Vertigo, benign positional 02/03/2012   Left groin pain 02/03/2012   Hip pain 10/10/2011   TIA (transient ischemic attack) 07/24/2011   Allergic reaction 07/05/2011   Osteoarthrosis involving lower leg 10/19/2010   Disorder of bone and cartilage 05/01/2010   Generalized anxiety disorder 05/01/2010   Vitamin D deficiency 02/20/2010   Unspecified chronic bronchitis ([place]) 08/24/2009   Other lymphedema 10/29/2007    is allergic to bacitracin-neomycin-polymyxin  [neomycin-bacitracin zn-polymyx], nsaids, tape, ambien [zolpidem tartrate], amoxicillin, clavulanic acid, contrast media [iodinated diagnostic agents], latex, and prednisone.  MEDICAL HISTORY: Past Medical History:  Diagnosis Date   Angiodysplasia  of intestine 08/23/2020   Anxiety    Arthritis    Breast cancer ([facility]) 06/13/15   Cancer ([facility]) 2000   breast cancer   Chronic bronchitis ([place])    Chronic bronchitis ([place])    Hyperlipidemia    Hypertension    Myocardial infarction [facility]) 2001   Personal history of radiation therapy    Restless leg    Stroke ([place]) 2004   TIA, no deficits    SURGICAL HISTORY: Past Surgical History:  Procedure Laterality Date   ABDOMINAL HYSTERECTOMY  1985   BREAST LUMPECTOMY Left 2000   radiation and chemo   BREAST LUMPECTOMY Right 2016   radiation   BREAST SURGERY  2001   lt breast lumpectomy   COLONOSCOPY WITH ESOPHAGOGASTRODUODENOSCOPY (EGD)  04/2020   ENTEROSCOPY N/A 03/15/2021   Procedure: ENTEROSCOPY;  Surgeon: Milus Banister, MD;  Location: [facility] ENDOSCOPY;  Service: Endoscopy;  Laterality: N/A;   GIVENS CAPSULE STUDY  07/2020   HOT HEMOSTASIS N/A 03/15/2021   Procedure: HOT HEMOSTASIS (ARGON PLASMA COAGULATION/BICAP);  Surgeon: Milus Banister, MD;  Location: [facility];  Service: Endoscopy;  Laterality: N/A;   IR IMAGING GUIDED PORT INSERTION  04/25/2020   RADIOACTIVE SEED GUIDED PARTIAL MASTECTOMY WITH AXILLARY SENTINEL LYMPH NODE BIOPSY Right 07/07/2015   Procedure: RIGHT RADIOACTIVE SEED GUIDED PARTIAL  MASTECTOMY WITH AXILLARY SENTINEL LYMPH NODE BIOPSY;  Surgeon: Autumn Messing III, MD;  Location: [facility];  Service: General;  Laterality: Right;   SMALL INTESTINE SURGERY     TUBAL LIGATION      SOCIAL HISTORY: Social History   Socioeconomic History   Marital status: Divorced    Spouse name: Not on file   Number of children: 7   Years of education: Not on file   Highest education level: Not on file  Occupational History   Occupation: retired  Tobacco Use   Smoking status: Former    Packs/day: 1.00    Years: 20.00    Pack years: 20.00    Types: Cigarettes    Quit date: 07/30/2011    Years since quitting: 9.7   Smokeless tobacco: Never   Tobacco comments:     Quit >4 years ago; 1 ppd for about 5/20 years (remaining was less)  Vaping Use   Vaping Use: Former  Substance and Sexual Activity   Alcohol use: No    Alcohol/week: 0.0 standard drinks   Drug use: No   Sexual activity: Not Currently  Other Topics Concern   Not on file  Social History Narrative   Lives alone.  Retired.  Education:  11th grade GED.  Children:  7 (one here).    Social Determinants of Health   Financial Resource Strain: Low Risk    Difficulty of Paying Living Expenses: Not hard at all  Food Insecurity: No Food Insecurity   Worried About Charity fundraiser in the Last Year: Never true   Arboriculturist in the Last Year: Never true  Transportation Needs: Not on file  Physical Activity: Inactive   Days of Exercise per Week: 0 days   Minutes of Exercise per Session: 0 min  Stress: No Stress Concern Present   Feeling of Stress : Not at all  Social Connections: Moderately Isolated   Frequency of Communication with Friends and Family: More than three times a week   Frequency of Social Gatherings with Friends and Family: Once a week   Attends Religious Services: 1 to 4 times per year   Active Member of [employer] or Organizations: No   Attends Music therapist: Never   Marital Status: Divorced  Human resources officer Violence: Not At Risk   Fear of Current or Ex-Partner: No   Emotionally Abused: No   Physically Abused: No   Sexually Abused: No    FAMILY HISTORY: Family History  Problem Relation Age of Onset   Emphysema Mother 85       smoker   Diabetes Father    Lung cancer Sister        dx. <50; former smoker   Diabetes Brother    Diabetes Brother    Brain cancer Brother 40       unknown tumor type   Stroke Maternal Grandmother    Diabetes Paternal Grandmother    Cancer Daughter 92       neck cancer   Other Daughter        hysterectomy for unspecified reason   Colon cancer Daughter    Diabetes Paternal Aunt    Breast cancer Cousin    Cancer  Cousin        unspecified type   Breast cancer Other        triple negative breast cancer in her 3s   Colon polyps Neg Hx    Esophageal cancer Neg Hx  Gallbladder disease Neg Hx    .10 Point review of Systems was done is negative except as noted above.   PHYSICAL EXAMINATION  ECOG PERFORMANCE STATUS: 1 - Symptomatic but completely ambulatory  Vitals:   04/04/21 1545  BP: (!) 118/55  Pulse: 87  Resp: 18  Temp: 98.5 F (36.9 C)  . GENERAL:alert, in no acute distress and comfortable SKIN: no acute rashes, no significant lesions EYES: conjunctiva are pink and non-injected, sclera anicteric OROPHARYNX: MMM, no exudates, no oropharyngeal erythema or ulceration NECK: supple, no JVD LYMPH:  no palpable lymphadenopathy in the cervical, axillary or inguinal regions LUNGS: clear to auscultation b/l with normal respiratory effort HEART: regular rate & rhythm ABDOMEN:  normoactive bowel sounds , non tender, not distended. Extremity: no pedal edema PSYCH: alert & oriented x 3 with fluent speech NEURO: no focal motor/sensory deficits  LABORATORY DATA: . CBC Latest Ref Rng & Units 04/04/2021 03/16/2021 03/08/2021  WBC 4.0 - 10.5 K/uL 5.2 5.3 5.3  Hemoglobin 12.0 - 15.0 g/dL 8.8(L) 8.5(L) 8.9(L)  Hematocrit 36.0 - 46.0 % 26.6(L) 26.6(L) 26.9(L)  Platelets 150 - 400 K/uL 274 184 223   . CMP Latest Ref Rng & Units 04/10/2021 04/04/2021 03/16/2021  Glucose 70 - 99 mg/dL 118(H) 111(H) 125(H)  BUN 6 - 23 mg/dL _0 Creatinine 0.40 - 1.20 mg/dL 1.51(H) 1.69(H) 1.56(H)  Sodium 135 - 145 mEq/L 142 142 140  Potassium 3.5 - 5.1 mEq/L 4.4 4.7 4.4  Chloride 96 - 112 mEq/L 105 110 112(H)  CO2 19 - 32 mEq/L 24 21(L) 23  Calcium 8.4 - 10.5 mg/dL 9.5 9.4 8.9  Total Protein 6.0 - 8.3 g/dL 6.8 6.5 6.3(L)  Total Bilirubin 0.2 - 1.2 mg/dL 0.4 0.2(L) 0.7  Alkaline Phos 39 - 117 U/L 72 80 52  AST 0 - 37 U/L _1 ALT 0 - 35 U/L _2 ASSESSMENT and THERAPY PLAN:   75 yo with     1)  IgG Kappa Multiple myeloma with bone lesions, anemia, renal insuff. M spike @ 3.7g/dl on diagnosis. 1p deletion, polymorphic variant, 13q deletion 2) h/o Dm2 3) Diabetic Neuropathy 4) CKD - likely from DM2, but could have an element of myeloma kidney. 5) h/o TIA and AMI 6) Iron deficiency     PLAN: -Discussed pt labwork today, 04/04/2021;  Labs today 04/04/2021 show CBC with a stable hemoglobin of 8.8 normal WBC count and platelets CMP stable with a creatinine of 1.69 Last myeloma panel on 03/08/2021 showed an M protein of 0.1 g/dL  -Patient recommended drinking at least 64 ounces of water daily and addressing her antihypertensive dosing with her primary care physician to avoid hypotension. -No primary toxicities from continued maintenance daratumumab every 4 weeks at this time. -Continue [place] Vitamin B12 q2weeks. -Continue Aredia every 8 weeks. -Continue 50000 IU Vitamin D weekly. -Continue Vitamin B-Complex daily.  Follow-up Please schedule next 3 cycles of monthly daratumumab with port flush and labs and MD visits Continue Aredia 8 weeks   . The total time spent in the appointment was 31 minutes and more than 50% was on counseling and direct patient cares.  Ordering and management of daratumumab, Aredia and B12  [name] MD MS Hematology/Oncology Physician [facility]

## 2021-04-12 ENCOUNTER — Other Ambulatory Visit: Payer: Self-pay

## 2021-04-12 DIAGNOSIS — R8290 Unspecified abnormal findings in urine: Secondary | ICD-10-CM

## 2021-04-12 DIAGNOSIS — R350 Frequency of micturition: Secondary | ICD-10-CM | POA: Diagnosis not present

## 2021-04-12 DIAGNOSIS — C9000 Multiple myeloma not having achieved remission: Secondary | ICD-10-CM

## 2021-04-12 LAB — POCT URINALYSIS DIPSTICK
Blood, UA: NEGATIVE
Glucose, UA: NEGATIVE
Ketones, UA: NEGATIVE
Nitrite, UA: POSITIVE
Protein, UA: NEGATIVE
Spec Grav, UA: 1.025 (ref 1.010–1.025)
Urobilinogen, UA: 0.2 E.U./dL
pH, UA: 5.5 (ref 5.0–8.0)

## 2021-04-14 LAB — URINE CULTURE
MICRO NUMBER:: 12379981
SPECIMEN QUALITY:: ADEQUATE

## 2021-04-16 ENCOUNTER — Other Ambulatory Visit: Payer: Self-pay

## 2021-04-16 DIAGNOSIS — N390 Urinary tract infection, site not specified: Secondary | ICD-10-CM

## 2021-04-16 MED ORDER — CEPHALEXIN 500 MG PO CAPS
500.0000 mg | ORAL_CAPSULE | Freq: Two times a day (BID) | ORAL | 0 refills | Status: DC
Start: 2021-04-16 — End: 2021-04-30

## 2021-04-16 NOTE — Progress Notes (Signed)
lfex

## 2021-04-17 NOTE — Assessment & Plan Note (Signed)
Chronic problem.  Pt's BP has actually been running low recently so she had decreased her Losartan to 1/2 tab daily and then did not take at all this morning.  At this time will hold BP meds and she will return in a few weeks to check BP and make sure this hasn't gone out of range.  Encouraged increased water intake, limited salt intake.  Hopefully with BP back in range some of her fatigue and shakiness will improve.

## 2021-04-17 NOTE — Assessment & Plan Note (Signed)
Chronic problem.  On Metformin 500mg  BID.  She states she has intermittent nausea but isn't taking the medication provided by oncology.  Feels 'shaky' but is not checking sugars.  Will get A1C today and determine what adjustments-if any- need to be made.  Encouraged regular eating to avoid low sugars.  Will follow.

## 2021-04-17 NOTE — Assessment & Plan Note (Signed)
BMI is 34.06 but given her other medical issues this qualifies as morbidly obese.  Working w/ pt on healthy, diabetic diet.

## 2021-04-17 NOTE — Assessment & Plan Note (Signed)
Chronic problem.  On Lipitor.  + nausea but unclear if this is her oncology treatments, her statin, or her diabetes.  Check labs.  Adjust meds prn

## 2021-04-19 ENCOUNTER — Telehealth: Payer: Self-pay

## 2021-04-19 ENCOUNTER — Other Ambulatory Visit: Payer: Self-pay

## 2021-04-19 DIAGNOSIS — N390 Urinary tract infection, site not specified: Secondary | ICD-10-CM

## 2021-04-19 MED ORDER — NITROFURANTOIN MONOHYD MACRO 100 MG PO CAPS: 100.0000 mg | ORAL_CAPSULE | Freq: Two times a day (BID) | ORAL | 0 refills | Status: DC

## 2021-04-19 NOTE — Telephone Encounter (Signed)
Called patent back about concerns. Per provider pt is to stop taking the Kelfex and start taking the Macrobid 1 tab BID. I informed pt if there is no improvement to let us know. Patient understood. No further concerns at this time.

## 2021-04-19 NOTE — Telephone Encounter (Signed)
Patient is coming in to see Richard tomorrow but she is breaking out in red bumps and nausea. Should she still keeping taking the antibiotics that you out her on because the UTI is not getting any better? Please advise

## 2021-04-19 NOTE — Telephone Encounter (Signed)
Pt called and she is coming in to see Richard tomorrow and she is breaking out in red bumps, UIT not improving and she is nausea does she still continue to take the antibiotic that Dr Birdie Riddle put her on?   Pt call back [phone]

## 2021-04-19 NOTE — Telephone Encounter (Signed)
She should stop the Keflex since she is developing a rash.  If she is having nausea, she should take some of her available nausea medication.  We can switch the antibiotics to Macrobid 100mg  1 tab po BID #14 as this will also treat the UTI

## 2021-04-20 ENCOUNTER — Telehealth (INDEPENDENT_AMBULATORY_CARE_PROVIDER_SITE_OTHER): Payer: Medicare Other | Admitting: Registered Nurse

## 2021-04-20 ENCOUNTER — Encounter: Payer: Self-pay | Admitting: Registered Nurse

## 2021-04-20 ENCOUNTER — Other Ambulatory Visit: Payer: Self-pay

## 2021-04-20 VITALS — BP 130/70 | HR 91 | Temp 97.9°F | Resp 20 | Ht 62.0 in | Wt 181.2 lb

## 2021-04-20 DIAGNOSIS — R21 Rash and other nonspecific skin eruption: Secondary | ICD-10-CM

## 2021-04-20 DIAGNOSIS — B379 Candidiasis, unspecified: Secondary | ICD-10-CM

## 2021-04-20 DIAGNOSIS — N390 Urinary tract infection, site not specified: Secondary | ICD-10-CM

## 2021-04-20 MED ORDER — HYDROXYZINE HCL 10 MG PO TABS: 5.0000 mg | ORAL_TABLET | Freq: Three times a day (TID) | ORAL | 0 refills | Status: DC | PRN

## 2021-04-20 MED ORDER — FLUCONAZOLE 150 MG PO TABS: 150.0000 mg | ORAL_TABLET | Freq: Once | ORAL | 0 refills | Status: AC

## 2021-04-20 NOTE — Progress Notes (Signed)
I connected with  [name] on 04/20/21 by a video enabled telemedicine application and verified that I am speaking with the correct person using two identifiers.   I discussed the limitations of evaluation and management by telemedicine. The patient expressed understanding and agreed to proceed.

## 2021-04-20 NOTE — Addendum Note (Signed)
Addended by: [name] on: 04/20/2021 03:49 PM   Modules accepted: Orders

## 2021-04-20 NOTE — Patient Instructions (Signed)
Ms. Malcolm -   Sometimes using an antibiotic can leave you prone to fungal infection. Taking a single dose of diflucan 150mg  can help this. Please take this as soon as you pick it up.  Using hydroxyzine (antihistamine) in conjunction with your claritin can help symptoms of redness and itching. Take 5-10mg  (half a tab to a full tab) up to three times daily as needed. It may make you a little sleepy.  Call in a week if symptoms have worsened or have failed to improve  Continue taking the Macrobid (antibiotic sent yesterday) throughout.   Thank you  Rich

## 2021-04-20 NOTE — Progress Notes (Signed)
Established Patient Office Visit  Subjective:  Patient ID: Tracy [name], female    DOB: Dec 24, 1945  Age: 75 y.o. MRN: [id number]  CC:  Chief Complaint  Patient presents with   Follow-up    HPI Tracy [name] presents for follow up   Urinary symptoms Symptoms overall improved No new symptoms Culture had returned, showed susceptibility to macrobid - has been placed on this yesterday. Some irritation on urethra   Skin changes Redness, itching Resolves into small dry patches One at R corner of lips One on back of neck One on back of thigh One on upper left  Has tried using alcohol pads which relieves itching Otherwise no improvement Nighttime is worse for itching.  Otherwise doing well.   Past Medical History:  Diagnosis Date   Angiodysplasia of intestine 08/23/2020   Anxiety    Arthritis    Breast cancer ([place]) 06/13/15   Cancer ([place]) 2000   breast cancer   Chronic bronchitis ([place])    Chronic bronchitis ([place])    Hyperlipidemia    Hypertension    Myocardial infarction [facility]) 2001   Personal history of radiation therapy    Restless leg    Stroke ([place]) 2004   TIA, no deficits    Past Surgical History:  Procedure Laterality Date   ABDOMINAL HYSTERECTOMY  1985   BREAST LUMPECTOMY Left 2000   radiation and chemo   BREAST LUMPECTOMY Right 2016   radiation   BREAST SURGERY  2001   lt breast lumpectomy   COLONOSCOPY WITH ESOPHAGOGASTRODUODENOSCOPY (EGD)  04/2020   ENTEROSCOPY N/A 03/15/2021   Procedure: ENTEROSCOPY;  Surgeon: Milus Banister, MD;  Location: [facility];  Service: Endoscopy;  Laterality: N/A;   GIVENS CAPSULE STUDY  07/2020   HOT HEMOSTASIS N/A 03/15/2021   Procedure: HOT HEMOSTASIS (ARGON PLASMA COAGULATION/BICAP);  Surgeon: Milus Banister, MD;  Location: [facility];  Service: Endoscopy;  Laterality: N/A;   IR IMAGING GUIDED PORT INSERTION  04/25/2020   RADIOACTIVE SEED GUIDED PARTIAL MASTECTOMY WITH AXILLARY SENTINEL LYMPH NODE BIOPSY  Right 07/07/2015   Procedure: RIGHT RADIOACTIVE SEED GUIDED PARTIAL MASTECTOMY WITH AXILLARY SENTINEL LYMPH NODE BIOPSY;  Surgeon: Autumn Messing III, MD;  Location: [facility];  Service: General;  Laterality: Right;   SMALL INTESTINE SURGERY     TUBAL LIGATION      Family History  Problem Relation Age of Onset   Emphysema Mother 54       smoker   Diabetes Father    Lung cancer Sister        dx. <50; former smoker   Diabetes Brother    Diabetes Brother    Brain cancer Brother 36       unknown tumor type   Stroke Maternal Grandmother    Diabetes Paternal Grandmother    Cancer Daughter 62       neck cancer   Other Daughter        hysterectomy for unspecified reason   Colon cancer Daughter    Diabetes Paternal Aunt    Breast cancer Cousin    Cancer Cousin        unspecified type   Breast cancer Other        triple negative breast cancer in her 28s   Colon polyps Neg Hx    Esophageal cancer Neg Hx    Gallbladder disease Neg Hx     Social History   Socioeconomic History   Marital status: Divorced    Spouse  name: Not on file   Number of children: 7   Years of education: Not on file   Highest education level: Not on file  Occupational History   Occupation: retired  Tobacco Use   Smoking status: Former    Packs/day: 1.00    Years: 20.00    Pack years: 20.00    Types: Cigarettes    Quit date: 07/30/2011    Years since quitting: 9.7   Smokeless tobacco: Never   Tobacco comments:    Quit >4 years ago; 1 ppd for about 5/20 years (remaining was less)  Vaping Use   Vaping Use: Former  Substance and Sexual Activity   Alcohol use: No    Alcohol/week: 0.0 standard drinks   Drug use: No   Sexual activity: Not Currently  Other Topics Concern   Not on file  Social History Narrative   Lives alone.  Retired.  Education:  11th grade GED.  Children:  7 (one here).    Social Determinants of Health   Financial Resource Strain: Low Risk    Difficulty of Paying  Living Expenses: Not hard at all  Food Insecurity: No Food Insecurity   Worried About Charity fundraiser in the Last Year: Never true   Arboriculturist in the Last Year: Never true  Transportation Needs: Not on file  Physical Activity: Inactive   Days of Exercise per Week: 0 days   Minutes of Exercise per Session: 0 min  Stress: No Stress Concern Present   Feeling of Stress : Not at all  Social Connections: Moderately Isolated   Frequency of Communication with Friends and Family: More than three times a week   Frequency of Social Gatherings with Friends and Family: Once a week   Attends Religious Services: 1 to 4 times per year   Active Member of [employer] or Organizations: No   Attends Archivist Meetings: Never   Marital Status: Divorced  Human resources officer Violence: Not At Risk   Fear of Current or Ex-Partner: No   Emotionally Abused: No   Physically Abused: No   Sexually Abused: No    Outpatient Medications Prior to Visit  Medication Sig Dispense Refill   Accu-Chek Softclix Lancets lancets Use as instructed to check sugars 1-2 times daily. 100 each 12   acyclovir (ZOVIRAX) 400 MG tablet TAKE 1 TABLET BY MOUTH  TWICE DAILY 180 tablet 1   albuterol (VENTOLIN HFA) 108 (90 Base) MCG/ACT inhaler Inhale 2 puffs into the lungs every 6 (six) hours as needed for wheezing or shortness of breath. 8 g 2   atorvastatin (LIPITOR) 10 MG tablet TAKE 1 TABLET BY MOUTH  DAILY 90 tablet 1   B Complex-C (B-COMPLEX WITH VITAMIN C) tablet Take 1 tablet by mouth in the morning.     cephALEXin (KEFLEX) 500 MG capsule Take 1 capsule (500 mg total) by mouth 2 (two) times daily. 10 capsule 0   dexamethasone (DECADRON) 4 MG tablet Take 3 tabs with breakfast the day after each treatment. 21 tablet 1   glucose blood (ACCU-CHEK GUIDE) test strip Use as instructed to check sugars 1-2 times daily. 100 each 12   lidocaine-prilocaine (EMLA) cream APPLY 1 APPLICATION TOPICALLY AS NEEDED. (Patient taking  differently: Apply 1 application topically as needed (prior to treatments).) 30 g 0   loratadine (CLARITIN) 10 MG tablet Take 10 mg by mouth daily.     meclizine (ANTIVERT) 25 MG tablet Take 1 tablet (25 mg total) by mouth  3 (three) times daily as needed for dizziness. 30 tablet 0   metFORMIN (GLUCOPHAGE) 500 MG tablet TAKE 1 TABLET(500 MG) BY MOUTH TWICE DAILY WITH A MEAL 180 tablet 1   Multiple Vitamin (MULTIVITAMIN WITH MINERALS) TABS tablet Take 1 tablet by mouth daily. Centrum for Women 50+     nitrofurantoin, macrocrystal-monohydrate, (MACROBID) 100 MG capsule Take 1 capsule (100 mg total) by mouth 2 (two) times daily. 14 capsule 0   ondansetron (ZOFRAN) 8 MG tablet Take 1 tablet (8 mg total) by mouth 2 (two) times daily as needed (Nausea or vomiting). 30 tablet 1   pantoprazole (PROTONIX) 40 MG tablet TAKE 1 TABLET BY MOUTH  TWICE DAILY (Patient taking differently: TAKE 1 TABLET BY MOUTH  TWICE DAILY) 180 tablet 0   potassium chloride SA (KLOR-CON) 20 MEQ tablet TAKE 1 TABLET(20 MEQ) BY MOUTH TWICE DAILY 180 tablet 1   pregabalin (LYRICA) 50 MG capsule TAKE 1 CAPSULE(50 MG) BY MOUTH THREE TIMES DAILY 270 capsule 1   prochlorperazine (COMPAZINE) 10 MG tablet Take 1 tablet (10 mg total) by mouth every 6 (six) hours as needed (Nausea or vomiting). 30 tablet 1   tiZANidine (ZANAFLEX) 4 MG tablet TAKE 1 TABLET(4 MG) BY MOUTH AT BEDTIME 90 tablet 1   traZODone (DESYREL) 100 MG tablet TAKE 1 TABLET BY MOUTH AT  BEDTIME 90 tablet 2   Vitamin D, Ergocalciferol, (DRISDOL) 1.25 MG (50000 UNIT) CAPS capsule TAKE 1 CAPSULE BY MOUTH 1 TIME A WEEK (Patient taking differently: Take 50,000 Units by mouth every Sunday.) 12 capsule 2   mometasone (NASONEX) 50 MCG/ACT nasal spray Place 2 sprays into the nose daily. (Patient taking differently: Place 2 sprays into the nose daily as needed (allergies.).) 17 g 12   No facility-administered medications prior to visit.    Allergies  Allergen Reactions    Bacitracin-Neomycin-Polymyxin  [Neomycin-Bacitracin Zn-Polymyx] Swelling   Nsaids Other (See Comments)    nosebleeds   Tape Hives    Pt cannot tolerate bandaids, tape, or any other adhesives.    Ambien [Zolpidem Tartrate]     Hives    Amoxicillin     Rash    Clavulanic Acid Hives   Contrast Media [Iodinated Diagnostic Agents] Hives    Pt states hives with prior ct, was given benadryl to resolve   Latex Swelling   Prednisone Swelling    Pt tolerates Dexamethasone. Throat swelling    ROS Review of Systems  Constitutional: Negative.   HENT: Negative.    Eyes: Negative.   Respiratory: Negative.    Cardiovascular: Negative.   Gastrointestinal: Negative.   Genitourinary: Negative.   Musculoskeletal: Negative.   Skin:  Positive for rash.  Neurological: Negative.   Psychiatric/Behavioral: Negative.    All other systems reviewed and are negative.    Objective:    Physical Exam Vitals and nursing note reviewed.  Constitutional:      General: She is not in acute distress.    Appearance: Normal appearance. She is normal weight. She is not ill-appearing, toxic-appearing or diaphoretic.  Cardiovascular:     Rate and Rhythm: Normal rate and regular rhythm.     Heart sounds: Normal heart sounds. No murmur heard.   No friction rub. No gallop.  Pulmonary:     Effort: Pulmonary effort is normal. No respiratory distress.     Breath sounds: Normal breath sounds. No stridor. No wheezing, rhonchi or rales.  Chest:     Chest wall: No tenderness.  Skin:    General:  Skin is warm and dry.     Findings: Rash (red, thickened, warm skin. past lesions look like resolving infection, new spots look more inflammatory) present.  Neurological:     General: No focal deficit present.     Mental Status: She is alert and oriented to person, place, and time. Mental status is at baseline.  Psychiatric:        Mood and Affect: Mood normal.        Behavior: Behavior normal.        Thought Content:  Thought content normal.        Judgment: Judgment normal.    BP 130/70   Pulse 91   Temp 97.9 F (36.6 C) (Temporal)   Resp 20   Ht '5\' 2"'  (1.575 m)   Wt 181 lb 3.2 oz (82.2 kg)   SpO2 100%   BMI 33.14 kg/m  Wt Readings from Last 3 Encounters:  04/20/21 181 lb 3.2 oz (82.2 kg)  04/10/21 186 lb 3.2 oz (84.5 kg)  04/04/21 189 lb 12.8 oz (86.1 kg)     Health Maintenance Due  Topic Date Due   OPHTHALMOLOGY EXAM  05/19/2020   FOOT EXAM  08/19/2020    There are no preventive care reminders to display for this patient.  Lab Results  Component Value Date   TSH 2.23 04/10/2021   Lab Results  Component Value Date   WBC 5.2 04/04/2021   HGB 8.8 (L) 04/04/2021   HCT 26.6 (L) 04/04/2021   MCV 95.3 04/04/2021   PLT 274 04/04/2021   Lab Results  Component Value Date   NA 142 04/10/2021   K 4.4 04/10/2021   CHLORIDE 105 07/13/2015   CO2 24 04/10/2021   GLUCOSE 118 (H) 04/10/2021   BUN 18 04/10/2021   CREATININE 1.51 (H) 04/10/2021   BILITOT 0.4 04/10/2021   ALKPHOS 72 04/10/2021   AST 14 04/10/2021   ALT 15 04/10/2021   PROT 6.8 04/10/2021   ALBUMIN 4.2 04/10/2021   CALCIUM 9.5 04/10/2021   ANIONGAP 11 04/04/2021   EGFR 71 (L) 07/13/2015   GFR 33.25 (L) 04/10/2021   Lab Results  Component Value Date   CHOL 188 04/10/2021   Lab Results  Component Value Date   HDL 79.20 04/10/2021   Lab Results  Component Value Date   LDLCALC 75 04/10/2021   Lab Results  Component Value Date   TRIG 167.0 (H) 04/10/2021   Lab Results  Component Value Date   CHOLHDL 2 04/10/2021   Lab Results  Component Value Date   HGBA1C 7.1 (H) 04/10/2021      Assessment & Plan:   Problem List Items Addressed This Visit   None Visit Diagnoses     Candida infection    -  Primary   Relevant Medications   fluconazole (DIFLUCAN) 150 MG tablet   Rash and nonspecific skin eruption       Relevant Medications   hydrOXYzine (ATARAX/VISTARIL) 10 MG tablet       Meds ordered  this encounter  Medications   fluconazole (DIFLUCAN) 150 MG tablet    Sig: Take 1 tablet (150 mg total) by mouth once for 1 dose.    Dispense:  1 tablet    Refill:  0    Order Specific Question:   Supervising Provider    Answer:   Carlota Raspberry, JEFFREY R [[id number]]   hydrOXYzine (ATARAX/VISTARIL) 10 MG tablet    Sig: Take 0.5-1 tablets (5-10 mg total) by mouth 3 (three)  times daily as needed.    Dispense:  30 tablet    Refill:  0    Order Specific Question:   Supervising Provider    Answer:   Carlota Raspberry, JEFFREY R [[id number]]    Follow-up: Return if symptoms worsen or fail to improve.   PLAN Unclear etiology. Will treat with diflucan with concern for vaginal candida, and potential that this is a tinea infection on skin. Will also give hydroxyzine for symptom relief Close monitoring of symptoms. Follow up in 1-2 weeks with PCP as planned, sooner with concerns Patient encouraged to call clinic with any questions, comments, or concerns.  [name], NP

## 2021-04-25 ENCOUNTER — Other Ambulatory Visit: Payer: Self-pay | Admitting: Hematology

## 2021-04-25 DIAGNOSIS — C9000 Multiple myeloma not having achieved remission: Secondary | ICD-10-CM

## 2021-04-26 ENCOUNTER — Encounter: Payer: Self-pay | Admitting: Hematology

## 2021-04-26 ENCOUNTER — Telehealth: Payer: Self-pay | Admitting: Family Medicine

## 2021-04-26 ENCOUNTER — Other Ambulatory Visit: Payer: Self-pay | Admitting: Hematology

## 2021-04-26 NOTE — Telephone Encounter (Signed)
Returned patients call to get some information about her symptoms. Patient did not answer. Left a message for patient to return my call.

## 2021-04-26 NOTE — Telephone Encounter (Signed)
Patient called and said that she has finished the antibiotic and the cream and that symptoms have gotten a little better but not completely.  Please advise.

## 2021-04-27 NOTE — Telephone Encounter (Signed)
Called patient back to follow up on pt uti symptoms. Pt phone line was busy.

## 2021-04-27 NOTE — Telephone Encounter (Signed)
Patient called back and stated that she is still having some itching and the meds she take for the itching makes her itch even more. She is also still having some weakness, eyes are itching and sore. She has finished med that were given for symptoms. Please advise

## 2021-04-27 NOTE — Telephone Encounter (Signed)
Patient returned your call from yesterday - please call back.

## 2021-04-30 ENCOUNTER — Telehealth: Payer: Medicare Other | Admitting: Registered Nurse

## 2021-04-30 ENCOUNTER — Emergency Department (HOSPITAL_BASED_OUTPATIENT_CLINIC_OR_DEPARTMENT_OTHER): Payer: Medicare Other

## 2021-04-30 ENCOUNTER — Ambulatory Visit (HOSPITAL_COMMUNITY)
Admission: EM | Admit: 2021-04-30 | Discharge: 2021-04-30 | Disposition: A | Discharge status: Home / Self Care | Payer: Medicare Other | Attending: Physician Assistant | Admitting: Physician Assistant

## 2021-04-30 ENCOUNTER — Encounter (HOSPITAL_COMMUNITY): Payer: Self-pay

## 2021-04-30 ENCOUNTER — Encounter (HOSPITAL_BASED_OUTPATIENT_CLINIC_OR_DEPARTMENT_OTHER): Payer: Self-pay

## 2021-04-30 ENCOUNTER — Emergency Department (HOSPITAL_BASED_OUTPATIENT_CLINIC_OR_DEPARTMENT_OTHER)
Admission: EM | Admit: 2021-04-30 | Discharge: 2021-04-30 | Disposition: A | Discharge status: Home / Self Care | Payer: Medicare Other | Attending: Emergency Medicine | Admitting: Emergency Medicine

## 2021-04-30 ENCOUNTER — Other Ambulatory Visit: Payer: Self-pay

## 2021-04-30 DIAGNOSIS — L299 Pruritus, unspecified: Secondary | ICD-10-CM | POA: Diagnosis not present

## 2021-04-30 DIAGNOSIS — R195 Other fecal abnormalities: Secondary | ICD-10-CM | POA: Diagnosis not present

## 2021-04-30 DIAGNOSIS — Z853 Personal history of malignant neoplasm of breast: Secondary | ICD-10-CM | POA: Insufficient documentation

## 2021-04-30 DIAGNOSIS — R21 Rash and other nonspecific skin eruption: Secondary | ICD-10-CM | POA: Diagnosis not present

## 2021-04-30 DIAGNOSIS — E785 Hyperlipidemia, unspecified: Secondary | ICD-10-CM | POA: Diagnosis not present

## 2021-04-30 DIAGNOSIS — Z7984 Long term (current) use of oral hypoglycemic drugs: Secondary | ICD-10-CM | POA: Insufficient documentation

## 2021-04-30 DIAGNOSIS — R1010 Upper abdominal pain, unspecified: Secondary | ICD-10-CM

## 2021-04-30 DIAGNOSIS — J45909 Unspecified asthma, uncomplicated: Secondary | ICD-10-CM | POA: Diagnosis not present

## 2021-04-30 DIAGNOSIS — I2510 Atherosclerotic heart disease of native coronary artery without angina pectoris: Secondary | ICD-10-CM | POA: Diagnosis not present

## 2021-04-30 DIAGNOSIS — K5730 Diverticulosis of large intestine without perforation or abscess without bleeding: Secondary | ICD-10-CM | POA: Diagnosis not present

## 2021-04-30 DIAGNOSIS — I700 Atherosclerosis of aorta: Secondary | ICD-10-CM | POA: Diagnosis not present

## 2021-04-30 DIAGNOSIS — Z79899 Other long term (current) drug therapy: Secondary | ICD-10-CM | POA: Insufficient documentation

## 2021-04-30 DIAGNOSIS — Z85830 Personal history of malignant neoplasm of bone: Secondary | ICD-10-CM | POA: Diagnosis not present

## 2021-04-30 DIAGNOSIS — Z87891 Personal history of nicotine dependence: Secondary | ICD-10-CM | POA: Insufficient documentation

## 2021-04-30 DIAGNOSIS — E1169 Type 2 diabetes mellitus with other specified complication: Secondary | ICD-10-CM | POA: Insufficient documentation

## 2021-04-30 DIAGNOSIS — R109 Unspecified abdominal pain: Secondary | ICD-10-CM | POA: Diagnosis not present

## 2021-04-30 DIAGNOSIS — I10 Essential (primary) hypertension: Secondary | ICD-10-CM | POA: Insufficient documentation

## 2021-04-30 DIAGNOSIS — R1084 Generalized abdominal pain: Secondary | ICD-10-CM | POA: Insufficient documentation

## 2021-04-30 DIAGNOSIS — Z91040 Latex allergy status: Secondary | ICD-10-CM | POA: Diagnosis not present

## 2021-04-30 DIAGNOSIS — R110 Nausea: Secondary | ICD-10-CM | POA: Diagnosis not present

## 2021-04-30 LAB — COMPREHENSIVE METABOLIC PANEL
ALT: 13 U/L (ref 0–44)
AST: 16 U/L (ref 15–41)
Albumin: 4.8 g/dL (ref 3.5–5.0)
Alkaline Phosphatase: 74 U/L (ref 38–126)
Anion gap: 12 (ref 5–15)
BUN: 15 mg/dL (ref 8–23)
CO2: 20 mmol/L — ABNORMAL LOW (ref 22–32)
Calcium: 9.1 mg/dL (ref 8.9–10.3)
Chloride: 107 mmol/L (ref 98–111)
Creatinine, Ser: 1.65 mg/dL — ABNORMAL HIGH (ref 0.44–1.00)
GFR, Estimated: 32 mL/min — ABNORMAL LOW (ref >60)
Glucose, Bld: 120 mg/dL — ABNORMAL HIGH (ref 70–99)
Potassium: 4.7 mmol/L (ref 3.5–5.1)
Sodium: 139 mmol/L (ref 135–145)
Total Bilirubin: 0.3 mg/dL (ref 0.3–1.2)
Total Protein: 7.7 g/dL (ref 6.5–8.1)

## 2021-04-30 LAB — CBC
HCT: 34.5 % — ABNORMAL LOW (ref 36.0–46.0)
Hemoglobin: 11.4 g/dL — ABNORMAL LOW (ref 12.0–15.0)
MCH: 31.1 pg (ref 26.0–34.0)
MCHC: 33 g/dL (ref 30.0–36.0)
MCV: 94 fL (ref 80.0–100.0)
Platelets: 350 10*3/uL (ref 150–400)
RBC: 3.67 MIL/uL — ABNORMAL LOW (ref 3.87–5.11)
RDW: 14.9 % (ref 11.5–15.5)
WBC: 5.4 10*3/uL (ref 4.0–10.5)
nRBC: 0 % (ref 0.0–0.2)

## 2021-04-30 LAB — URINALYSIS, ROUTINE W REFLEX MICROSCOPIC
Bilirubin Urine: NEGATIVE
Glucose, UA: NEGATIVE mg/dL
Hgb urine dipstick: NEGATIVE
Leukocytes,Ua: NEGATIVE
Nitrite: NEGATIVE
Specific Gravity, Urine: 1.023 (ref 1.005–1.030)
pH: 5 (ref 5.0–8.0)

## 2021-04-30 LAB — POCT URINALYSIS DIPSTICK, ED / UC
Bilirubin Urine: NEGATIVE
Glucose, UA: NEGATIVE mg/dL
Hgb urine dipstick: NEGATIVE
Leukocytes,Ua: NEGATIVE
Nitrite: NEGATIVE
Protein, ur: NEGATIVE mg/dL
Specific Gravity, Urine: 1.025 (ref 1.005–1.030)
Urobilinogen, UA: 0.2 mg/dL (ref 0.0–1.0)
pH: 5 (ref 5.0–8.0)

## 2021-04-30 LAB — LIPASE, BLOOD: Lipase: 59 U/L — ABNORMAL HIGH (ref 11–51)

## 2021-04-30 MED ORDER — DIPHENHYDRAMINE HCL 50 MG/ML IJ SOLN
12.5000 mg | Freq: Once | INTRAMUSCULAR | Status: AC
  Administered 2021-04-30: 12.5 mg via INTRAVENOUS
  Filled 2021-04-30: qty 1

## 2021-04-30 MED ORDER — ONDANSETRON HCL 4 MG/2ML IJ SOLN
4.0000 mg | Freq: Once | INTRAMUSCULAR | Status: AC
Start: 2021-04-30 — End: 2021-04-30
  Administered 2021-04-30: 4 mg via INTRAVENOUS
  Filled 2021-04-30: qty 2

## 2021-04-30 MED ORDER — TRIAMCINOLONE ACETONIDE 0.1 % EX CREA: 1.0000 "application " | TOPICAL_CREAM | Freq: Two times a day (BID) | CUTANEOUS | 0 refills | Status: DC

## 2021-04-30 MED ORDER — HYDROMORPHONE HCL 1 MG/ML IJ SOLN
0.5000 mg | Freq: Once | INTRAMUSCULAR | Status: AC
  Administered 2021-04-30: 0.5 mg via INTRAVENOUS
  Filled 2021-04-30: qty 1

## 2021-04-30 MED ORDER — SODIUM CHLORIDE 0.9 % IV BOLUS
500.0000 mL | Freq: Once | INTRAVENOUS | Status: AC
Start: 2021-04-30 — End: 2021-04-30
  Administered 2021-04-30: 500 mL via INTRAVENOUS

## 2021-04-30 NOTE — ED Triage Notes (Signed)
Pt reports chills,itching,  nausea and feeling weak x 1 month.   Pt reports she was treated for UTI and finished the antibiotic 1 week ago.

## 2021-04-30 NOTE — Discharge Instructions (Addendum)
Apply steroid cream to your rash as prescribed.  Follow-up with your doctor.

## 2021-04-30 NOTE — ED Triage Notes (Signed)
Patient here POV from Home with ABD Pain, Itching, and Nausea.  Patient states she began having these symptoms approximately 1 month PTA. Patient states her ABD Pain began approximately at the same time as the itching and is mainly located in the Right Side.  Patient has redness to Area and Right Arm but Patient states earlier when it began it was generalized throughout her Body.  NAD Noted during Triage. A&Ox4. GCS 15.

## 2021-04-30 NOTE — ED Provider Notes (Signed)
[facility] EMERGENCY DEPT Provider Note   CSN: [id number] Arrival date & time: 04/30/21  1441     History Chief Complaint  Patient presents with   Abdominal Pain   Pruritis    Tracy [name] is a 75 y.o. female.  75 year old female with history of multiple myeloma, MI, diabetes, additional history as listed below presents with complaint of abdominal pain.  Patient reports pain to her suprapubic area as well as right side abdomen onset 1 month ago with generalized itching.  Patient went to her primary care provider who diagnosed her with a UTI and started her on an antibiotic.  Patient felt like the itching became worse, has a reddened area to her right upper quadrant and states she has a significant amount of pain underneath this area for the past few days.  Her generalized right-sided abdominal pain has been worse for the past 2 days.  She also reports intense nausea without vomiting.  She denies fevers, chills, changes in bowel or bladder habits.  Patient was seen in urgent care earlier today and sent to the ER for further evaluation.  Prior abdominal surgery includes hysterectomy and C-section.      Past Medical History:  Diagnosis Date   Angiodysplasia of intestine 08/23/2020   Anxiety    Arthritis    Breast cancer ([place]) 06/13/15   Cancer ([place]) 2000   breast cancer   Chronic bronchitis ([place])    Chronic bronchitis ([place])    Hyperlipidemia    Hypertension    Myocardial infarction [facility]) 2001   Personal history of radiation therapy    Restless leg    Stroke ([place]) 2004   TIA, no deficits    Patient Active Problem List   Diagnosis Date Noted   Bone metastases ([place]) 02/08/2021   Angiodysplasia of intestine 08/23/2020   Port-A-Cath in place 08/04/2020   Counseling regarding advance care planning and goals of care 02/07/2020   Multiple myeloma ([place]) 01/25/2020   Dizziness 10/12/2019   Post-nasal drainage 10/12/2019   Allergic rhinitis 08/19/2018   Type 2  diabetes mellitus with diabetic neuropathy, unspecified ([place]) 11/05/2017   Encounter for long-term use of muscle relaxants 09/24/2017   CAD in native artery 09/24/2017   OSA (obstructive sleep apnea) 09/24/2017   [place] 09/24/2017   Asthenia 06/30/2017   Morbid obesity ([place]) 06/02/2017   Depression 06/02/2017   Iron deficiency anemia 09/23/2016   Anemia of chronic disease 09/28/2015   Genetic testing 08/21/2015   History of left breast cancer 07/13/2015   History of right breast cancer 06/20/2015   Hearing loss due to cerumen impaction 12/29/2014   Allergy to adhesive tape 05/19/2014   Hyperlipidemia 12/06/2013   GERD (gastroesophageal reflux disease) 12/06/2013   Cervical disc disease 11/11/2013   Osteopenia 05/25/2013   Allergic asthma 12/24/2012   Insomnia 04/02/2012   Physical exam, annual 04/02/2012   HTN (hypertension) 02/03/2012   Vertigo, benign positional 02/03/2012   Left groin pain 02/03/2012   Hip pain 10/10/2011   TIA (transient ischemic attack) 07/24/2011   Allergic reaction 07/05/2011   Osteoarthrosis involving lower leg 10/19/2010   Disorder of bone and cartilage 05/01/2010   Generalized anxiety disorder 05/01/2010   Vitamin D deficiency 02/20/2010   Unspecified chronic bronchitis ([place]) 08/24/2009   Other lymphedema 10/29/2007    Past Surgical History:  Procedure Laterality Date   ABDOMINAL HYSTERECTOMY  1985   BREAST LUMPECTOMY Left 2000   radiation and chemo   BREAST LUMPECTOMY Right 2016  radiation   BREAST SURGERY  2001   lt breast lumpectomy   COLONOSCOPY WITH ESOPHAGOGASTRODUODENOSCOPY (EGD)  04/2020   ENTEROSCOPY N/A 03/15/2021   Procedure: ENTEROSCOPY;  Surgeon: Milus Banister, MD;  Location: [facility] END[facility];  Service: Endoscopy;  Laterality: N/A;   GIVENS CAPSULE STUDY  07/2020   HOT HEMOSTASIS N/A 03/15/2021   Procedure: HOT HEMOSTASIS (ARGON PLASMA COAGULATION/BICAP);  Surgeon: Milus Banister, MD;  Location: [facility];  Service:  Endoscopy;  Laterality: N/A;   IR IMAGING GUIDED PORT INSERTION  04/25/2020   RADIOACTIVE SEED GUIDED PARTIAL MASTECTOMY WITH AXILLARY SENTINEL LYMPH NODE BIOPSY Right 07/07/2015   Procedure: RIGHT RADIOACTIVE SEED GUIDED PARTIAL MASTECTOMY WITH AXILLARY SENTINEL LYMPH NODE BIOPSY;  Surgeon: Autumn Messing III, MD;  Location: [facility];  Service: General;  Laterality: Right;   SMALL INTESTINE SURGERY     TUBAL LIGATION       OB History   No obstetric history on file.     Family History  Problem Relation Age of Onset   Emphysema Mother 12       smoker   Diabetes Father    Lung cancer Sister        dx. <50; former smoker   Diabetes Brother    Diabetes Brother    Brain cancer Brother 43       unknown tumor type   Stroke Maternal Grandmother    Diabetes Paternal Grandmother    Cancer Daughter 43       neck cancer   Other Daughter        hysterectomy for unspecified reason   Colon cancer Daughter    Diabetes Paternal Aunt    Breast cancer Cousin    Cancer Cousin        unspecified type   Breast cancer Other        triple negative breast cancer in her 96s   Colon polyps Neg Hx    Esophageal cancer Neg Hx    Gallbladder disease Neg Hx     Social History   Tobacco Use   Smoking status: Former    Packs/day: 1.00    Years: 20.00    Pack years: 20.00    Types: Cigarettes    Quit date: 07/30/2011    Years since quitting: 9.7   Smokeless tobacco: Never   Tobacco comments:    Quit >4 years ago; 1 ppd for about 5/20 years (remaining was less)  Vaping Use   Vaping Use: Former  Substance Use Topics   Alcohol use: No    Alcohol/week: 0.0 standard drinks   Drug use: No    Home Medications Prior to Admission medications   Medication Sig Start Date End Date Taking? Authorizing Provider  triamcinolone cream (KENALOG) 0.1 % Apply 1 application topically 2 (two) times daily. 04/30/21  Yes Tacy Learn, PA-C  Accu-Chek Softclix Lancets lancets Use as instructed to  check sugars 1-2 times daily. 02/10/20   [name], MD  acyclovir (ZOVIRAX) 400 MG tablet TAKE 1 TABLET BY MOUTH  TWICE DAILY 03/20/21   [name], MD  albuterol (VENTOLIN HFA) 108 (90 Base) MCG/ACT inhaler Inhale 2 puffs into the lungs every 6 (six) hours as needed for wheezing or shortness of breath. 03/17/20   Sandi Mealy E., PA-C  atorvastatin (LIPITOR) 10 MG tablet TAKE 1 TABLET BY MOUTH  DAILY 03/21/21   [name], MD  B Complex-C (B-COMPLEX WITH VITAMIN C) tablet Take 1 tablet by mouth  in the morning.    [provider]  dexamethasone (DECADRON) 4 MG tablet Take 3 tabs with breakfast the day after each treatment. 02/20/21   [name], MD  glucose blood (ACCU-CHEK GUIDE) test strip Use as instructed to check sugars 1-2 times daily. 02/10/20   [name], MD  hydrOXYzine (ATARAX/VISTARIL) 10 MG tablet Take 0.5-1 tablets (5-10 mg total) by mouth 3 (three) times daily as needed. 04/20/21   [name], NP  lidocaine-prilocaine (EMLA) cream APPLY 1 APPLICATION TOPICALLY AS NEEDED. Patient taking differently: Apply 1 application topically as needed (prior to treatments). 09/25/20   [name], MD  loratadine (CLARITIN) 10 MG tablet Take 10 mg by mouth daily.    [provider]  meclizine (ANTIVERT) 25 MG tablet Take 1 tablet (25 mg total) by mouth 3 (three) times daily as needed for dizziness. 03/08/21   Gardenia Phlegm, NP  metFORMIN (GLUCOPHAGE) 500 MG tablet TAKE 1 TABLET(500 MG) BY MOUTH TWICE DAILY WITH A MEAL 12/22/20   [name], MD  mometasone (NASONEX) 50 MCG/ACT nasal spray Place 2 sprays into the nose daily. Patient taking differently: Place 2 sprays into the nose daily as needed (allergies.). 03/16/20 03/16/21  [name], MD  Multiple Vitamin (MULTIVITAMIN WITH MINERALS) TABS tablet Take 1 tablet by mouth daily. Centrum for Women 50+    [provider]  ondansetron (ZOFRAN) 8 MG tablet  Take 1 tablet (8 mg total) by mouth 2 (two) times daily as needed (Nausea or vomiting). 02/07/20   [name], MD  pantoprazole (PROTONIX) 40 MG tablet TAKE 1 TABLET BY MOUTH  TWICE DAILY Patient taking differently: TAKE 1 TABLET BY MOUTH  TWICE DAILY 02/05/21   [name], MD  potassium chloride SA (KLOR-CON) 20 MEQ tablet TAKE 1 TABLET BY MOUTH  TWICE DAILY 04/26/21   [name], MD  pregabalin (LYRICA) 50 MG capsule TAKE 1 CAPSULE(50 MG) BY MOUTH THREE TIMES DAILY 12/22/20   [name], MD  prochlorperazine (COMPAZINE) 10 MG tablet Take 1 tablet (10 mg total) by mouth every 6 (six) hours as needed (Nausea or vomiting). 02/07/20   [name], MD  tiZANidine (ZANAFLEX) 4 MG tablet TAKE 1 TABLET(4 MG) BY MOUTH AT BEDTIME 12/22/20   [name], MD  traZODone (DESYREL) 100 MG tablet TAKE 1 TABLET BY MOUTH AT  BEDTIME 11/13/20   [name], MD  Vitamin D, Ergocalciferol, (DRISDOL) 1.25 MG (50000 UNIT) CAPS capsule TAKE 1 CAPSULE BY MOUTH 1 TIME A WEEK Patient taking differently: Take 50,000 Units by mouth every Sunday. 12/29/20   [name], MD    Allergies    Bacitracin-neomycin-polymyxin  [neomycin-bacitracin zn-polymyx], Nsaids, Tape, Ambien [zolpidem tartrate], Amoxicillin, Clavulanic acid, Contrast media [iodinated diagnostic agents], Latex, and Prednisone  Review of Systems   Review of Systems  Constitutional:  Negative for chills and fever.  Respiratory:  Negative for shortness of breath.   Cardiovascular:  Negative for chest pain.  Gastrointestinal:  Positive for abdominal pain and nausea. Negative for blood in stool, constipation, diarrhea and vomiting.  Genitourinary:  Positive for frequency. Negative for dysuria.  Musculoskeletal:  Negative for arthralgias and myalgias.  Skin:  Positive for rash. Negative for wound.  Allergic/Immunologic: Positive for immunocompromised state.  Neurological:  Negative for dizziness,  weakness and headaches.  Hematological:  Negative for adenopathy.  Psychiatric/Behavioral:  Negative for confusion.   All other systems reviewed and are negative.  Physical Exam Updated  Vital Signs BP (!) 162/64   Pulse 78   Temp 98.6 F (37 C) (Oral)   Resp 18   Ht '5\' 2"'  (1.575 m)   Wt 82.2 kg   SpO2 100%   BMI 33.15 kg/m   Physical Exam Vitals and nursing note reviewed.  Constitutional:      General: She is not in acute distress.    Appearance: She is well-developed. She is obese. She is not diaphoretic.  HENT:     Head: Normocephalic and atraumatic.  Cardiovascular:     Rate and Rhythm: Normal rate and regular rhythm.     Heart sounds: Normal heart sounds.  Pulmonary:     Effort: Pulmonary effort is normal.     Breath sounds: Normal breath sounds.  Abdominal:     Palpations: Abdomen is soft.     Tenderness: There is abdominal tenderness in the right upper quadrant, right lower quadrant and suprapubic area. There is no right CVA tenderness or left CVA tenderness.  Skin:    General: Skin is warm and dry.     Comments: Approximately 10 cm x 10 cm area of erythema to right mid to upper quadrant.  Neurological:     Mental Status: She is alert and oriented to person, place, and time.  Psychiatric:        Behavior: Behavior normal.    ED Results / Procedures / Treatments   Labs (all labs ordered are listed, but only abnormal results are displayed) Labs Reviewed  LIPASE, BLOOD - Abnormal; Notable for the following components:      Result Value   Lipase 59 (*)    All other components within normal limits  COMPREHENSIVE METABOLIC PANEL - Abnormal; Notable for the following components:   CO2 20 (*)    Glucose, Bld 120 (*)    Creatinine, Ser 1.65 (*)    GFR, Estimated 32 (*)    All other components within normal limits  CBC - Abnormal; Notable for the following components:   RBC 3.67 (*)    Hemoglobin 11.4 (*)    HCT 34.5 (*)    All other components within normal  limits  URINALYSIS, ROUTINE W REFLEX MICROSCOPIC - Abnormal; Notable for the following components:   Ketones, ur TRACE (*)    Protein, ur TRACE (*)    All other components within normal limits    EKG None  Radiology CT Abdomen Pelvis Wo Contrast  Result Date: 04/30/2021 CLINICAL DATA:  Abdomen pain EXAM: CT ABDOMEN AND PELVIS WITHOUT CONTRAST TECHNIQUE: Multidetector CT imaging of the abdomen and pelvis was performed following the standard protocol without IV contrast. COMPARISON:  PET CT 01/20/2020 FINDINGS: Lower chest: Lung bases demonstrate no acute consolidation or pleural effusion. Normal cardiac size Hepatobiliary: No focal liver abnormality is seen. No gallstones, gallbladder wall thickening, or biliary dilatation. Pancreas: Unremarkable. No pancreatic ductal dilatation or surrounding inflammatory changes. Spleen: Normal in size without focal abnormality. Adrenals/Urinary Tract: Adrenal glands are unremarkable. Kidneys are normal, without renal calculi, focal lesion, or hydronephrosis. Bladder is unremarkable. Stomach/Bowel: Stomach is within normal limits. Appendix appears normal. No evidence of bowel wall thickening, distention, or inflammatory changes. Sigmoid colon diverticula without acute wall thickening. Vascular/Lymphatic: Mild aortic atherosclerosis. No aneurysm. No suspicious nodes Reproductive: Status post hysterectomy. No adnexal masses. Other: Negative for free air or free fluid Musculoskeletal: Lytic lesions within the left acetabulum and L5 vertebral body. Calcifying matrix within the acetabular lesion. Interval endplate deformities superior and inferior at L5. IMPRESSION: 1.  No CT evidence for acute intra-abdominal or pelvic abnormality. 2. Left colon diverticular disease without acute inflammation 3. Redemonstrated lytic lesions at L5 and left acetabulum. Interval endplate deformities at L5 associated with the lytic lesion. Electronically Signed   By: Donavan Foil M.D.   On:  04/30/2021 20:02    Procedures Procedures   Medications Ordered in ED Medications  sodium chloride 0.9 % bolus 500 mL (0 mLs Intravenous Stopped 04/30/21 2108)  ondansetron (ZOFRAN) injection 4 mg (4 mg Intravenous Given 04/30/21 1855)  HYDROmorphone (DILAUDID) injection 0.5 mg (0.5 mg Intravenous Given 04/30/21 1856)  diphenhydrAMINE (BENADRYL) injection 12.5 mg (12.5 mg Intravenous Given 04/30/21 1934)    ED Course  I have reviewed the triage vital signs and the nursing notes.  Pertinent labs & imaging results that were available during my care of the patient were reviewed by me and considered in my medical decision making (see chart for details).  Clinical Course as of 04/30/21 2139  Mon Apr 30, 6766  [id number] 75 year old female with complaint of right-sided abdominal pain as well as itching and rash. On exam, has right-sided and suprapubic tenderness.  Does have an area of redness to her right mid to upper abdomen from where she has been scratching, no lesions or vesicles otherwise. CBC without significant findings.  Lipase mildly elevated at 59, do not suspect pancreatitis.  CMP with elevated creatinine at 1.65, not significantly changed from prior.  Urinalysis is positive for ketones and protein today, do not suspect UTI. CT abdomen pelvis without findings to explain patient's pain today. Recommend patient follow-up with her primary care provider for recheck.  We will give her triamcinolone cream to apply to her rash. [LM]    Clinical Course User Index [LM] Roque Lias   MDM Rules/Calculators/A&P                           Final Clinical Impression(s) / ED Diagnoses Final diagnoses:  Generalized abdominal pain  Rash    Rx / DC Orders ED Discharge Orders          Ordered    triamcinolone cream (KENALOG) 0.1 %  2 times daily        04/30/21 2137             Tacy Learn, PA-C 04/30/21 2139    Fredia Sorrow, MD 05/01/21 [phone]

## 2021-04-30 NOTE — ED Provider Notes (Signed)
Media    CSN: [id number] Arrival date & time: 04/30/21  1138      History   Chief Complaint Chief Complaint  Patient presents with   Chills   Nausea   Weakness    HPI Tracy [name] is a 75 y.o. female.   Patient presents today with a 1 month history of widespread pruritus.  She has been seen by PCP who initially thought this along with other symptoms including abdominal pain and nausea related to UTI and she was treated with Keflex and then ultimately Macrobid.  She reports symptoms had already been present prior to starting these antibiotics.  She had lab testing with PCP on 04/10/2021 that was essentially normal with normal liver function.  She denies any changes to personal hygiene products including soaps or detergents.  She does have a history of multiple myeloma and is currently treated with Daratumumab.  She does report that her first dose of this medication was several days prior to onset of symptoms.  She reports associated acholic stool which she describes as tan that this has resolved and she is now experiencing more normal colored bowel movements.  She still has her gallbladder.  She reports intense pruritus that has not responded to topical medications or hydroxyzine.  This initially did not have rash associated with it but she does have several erythematous areas now where itching is more intense.  She reports intense nausea to the point that she is having difficulty eating and drinking.  She has been pushing fluids but reports very little solid food intake as result of symptoms.  She does report symptoms are worse after eating.  She does have a gastroenterologist and had an endoscopy recently but has not had her gallbladder imaged.  She did contact her oncologist regarding symptoms following initiating treatment of immune modulating medication but they do not think that current symptoms are related to medication.   Past Medical History:  Diagnosis Date    Angiodysplasia of intestine 08/23/2020   Anxiety    Arthritis    Breast cancer ([place]) 06/13/15   Cancer ([place]) 2000   breast cancer   Chronic bronchitis ([place])    Chronic bronchitis ([place])    Hyperlipidemia    Hypertension    Myocardial infarction [facility]) 2001   Personal history of radiation therapy    Restless leg    Stroke ([place]) 2004   TIA, no deficits    Patient Active Problem List   Diagnosis Date Noted   Bone metastases ([place]) 02/08/2021   Angiodysplasia of intestine 08/23/2020   Port-A-Cath in place 08/04/2020   Counseling regarding advance care planning and goals of care 02/07/2020   Multiple myeloma ([place]) 01/25/2020   Dizziness 10/12/2019   Post-nasal drainage 10/12/2019   Allergic rhinitis 08/19/2018   Type 2 diabetes mellitus with diabetic neuropathy, unspecified ([place]) 11/05/2017   Encounter for long-term use of muscle relaxants 09/24/2017   CAD in native artery 09/24/2017   OSA (obstructive sleep apnea) 09/24/2017   Snorings 09/24/2017   Asthenia 06/30/2017   Morbid obesity ([place]) 06/02/2017   Depression 06/02/2017   Iron deficiency anemia 09/23/2016   Anemia of chronic disease 09/28/2015   Genetic testing 08/21/2015   History of left breast cancer 07/13/2015   History of right breast cancer 06/20/2015   Hearing loss due to cerumen impaction 12/29/2014   Allergy to adhesive tape 05/19/2014   Hyperlipidemia 12/06/2013   GERD (gastroesophageal reflux disease) 12/06/2013   Cervical disc disease 11/11/2013  Osteopenia 05/25/2013   Allergic asthma 12/24/2012   Insomnia 04/02/2012   Physical exam, annual 04/02/2012   HTN (hypertension) 02/03/2012   Vertigo, benign positional 02/03/2012   Left groin pain 02/03/2012   Hip pain 10/10/2011   TIA (transient ischemic attack) 07/24/2011   Allergic reaction 07/05/2011   Osteoarthrosis involving lower leg 10/19/2010   Disorder of bone and cartilage 05/01/2010   Generalized anxiety disorder 05/01/2010   Vitamin D  deficiency 02/20/2010   Unspecified chronic bronchitis ([place]) 08/24/2009   Other lymphedema 10/29/2007    Past Surgical History:  Procedure Laterality Date   ABDOMINAL HYSTERECTOMY  1985   BREAST LUMPECTOMY Left 2000   radiation and chemo   BREAST LUMPECTOMY Right 2016   radiation   BREAST SURGERY  2001   lt breast lumpectomy   COLONOSCOPY WITH ESOPHAGOGASTRODUODENOSCOPY (EGD)  04/2020   ENTEROSCOPY N/A 03/15/2021   Procedure: ENTEROSCOPY;  Surgeon: Milus Banister, MD;  Location: [facility];  Service: Endoscopy;  Laterality: N/A;   GIVENS CAPSULE STUDY  07/2020   HOT HEMOSTASIS N/A 03/15/2021   Procedure: HOT HEMOSTASIS (ARGON PLASMA COAGULATION/BICAP);  Surgeon: Milus Banister, MD;  Location: [facility];  Service: Endoscopy;  Laterality: N/A;   IR IMAGING GUIDED PORT INSERTION  04/25/2020   RADIOACTIVE SEED GUIDED PARTIAL MASTECTOMY WITH AXILLARY SENTINEL LYMPH NODE BIOPSY Right 07/07/2015   Procedure: RIGHT RADIOACTIVE SEED GUIDED PARTIAL MASTECTOMY WITH AXILLARY SENTINEL LYMPH NODE BIOPSY;  Surgeon: Autumn Messing III, MD;  Location: [place];  Service: General;  Laterality: Right;   SMALL INTESTINE SURGERY     TUBAL LIGATION      OB History   No obstetric history on file.      Home Medications    Prior to Admission medications   Medication Sig Start Date End Date Taking? Authorizing Provider  Accu-Chek Softclix Lancets lancets Use as instructed to check sugars 1-2 times daily. 02/10/20   [name], MD  acyclovir (ZOVIRAX) 400 MG tablet TAKE 1 TABLET BY MOUTH  TWICE DAILY 03/20/21   [name], MD  albuterol (VENTOLIN HFA) 108 (90 Base) MCG/ACT inhaler Inhale 2 puffs into the lungs every 6 (six) hours as needed for wheezing or shortness of breath. 03/17/20   Tanner, Lyndon Code., PA-C  atorvastatin (LIPITOR) 10 MG tablet TAKE 1 TABLET BY MOUTH  DAILY 03/21/21   [name], MD  B Complex-C (B-COMPLEX WITH VITAMIN C) tablet Take 1 tablet by  mouth in the morning.    [provider]  dexamethasone (DECADRON) 4 MG tablet Take 3 tabs with breakfast the day after each treatment. 02/20/21   [name], MD  glucose blood (ACCU-CHEK GUIDE) test strip Use as instructed to check sugars 1-2 times daily. 02/10/20   [name], MD  hydrOXYzine (ATARAX/VISTARIL) 10 MG tablet Take 0.5-1 tablets (5-10 mg total) by mouth 3 (three) times daily as needed. 04/20/21   [name], NP  lidocaine-prilocaine (EMLA) cream APPLY 1 APPLICATION TOPICALLY AS NEEDED. Patient taking differently: Apply 1 application topically as needed (prior to treatments). 09/25/20   [name], MD  loratadine (CLARITIN) 10 MG tablet Take 10 mg by mouth daily.    [provider]  meclizine (ANTIVERT) 25 MG tablet Take 1 tablet (25 mg total) by mouth 3 (three) times daily as needed for dizziness. 03/08/21   G[place]ia Phlegm, NP  metFORMIN (GLUCOPHAGE) 500 MG tablet TAKE 1 TABLET(500 MG) BY MOUTH TWICE DAILY WITH A MEAL 12/22/20  [name], MD  mometasone (NASONEX) 50 MCG/ACT nasal spray Place 2 sprays into the nose daily. Patient taking differently: Place 2 sprays into the nose daily as needed (allergies.). 03/16/20 03/16/21  [name], MD  Multiple Vitamin (MULTIVITAMIN WITH MINERALS) TABS tablet Take 1 tablet by mouth daily. Centrum for Women 50+    [provider]  ondansetron (ZOFRAN) 8 MG tablet Take 1 tablet (8 mg total) by mouth 2 (two) times daily as needed (Nausea or vomiting). 02/07/20   [name], MD  pantoprazole (PROTONIX) 40 MG tablet TAKE 1 TABLET BY MOUTH  TWICE DAILY Patient taking differently: TAKE 1 TABLET BY MOUTH  TWICE DAILY 02/05/21   [name], MD  potassium chloride SA (KLOR-CON) 20 MEQ tablet TAKE 1 TABLET BY MOUTH  TWICE DAILY 04/26/21   [name], MD  pregabalin (LYRICA) 50 MG capsule TAKE 1 CAPSULE(50 MG) BY MOUTH THREE TIMES DAILY 12/22/20   [name], MD  prochlorperazine (COMPAZINE) 10 MG tablet Take 1 tablet (10 mg total) by mouth every 6 (six) hours as needed (Nausea or vomiting). 02/07/20   [name], MD  tiZANidine (ZANAFLEX) 4 MG tablet TAKE 1 TABLET(4 MG) BY MOUTH AT BEDTIME 12/22/20   [name], MD  traZODone (DESYREL) 100 MG tablet TAKE 1 TABLET BY MOUTH AT  BEDTIME 11/13/20   [name], MD  Vitamin D, Ergocalciferol, (DRISDOL) 1.25 MG (50000 UNIT) CAPS capsule TAKE 1 CAPSULE BY MOUTH 1 TIME A WEEK Patient taking differently: Take 50,000 Units by mouth every Sunday. 12/29/20   [name], MD    Family History Family History  Problem Relation Age of Onset   Emphysema Mother 54       smoker   Diabetes Father    Lung cancer Sister        dx. <50; former smoker   Diabetes Brother    Diabetes Brother    Brain cancer Brother 72       unknown tumor type   Stroke Maternal Grandmother    Diabetes Paternal Grandmother    Cancer Daughter 53       neck cancer   Other Daughter        hysterectomy for unspecified reason   Colon cancer Daughter    Diabetes Paternal Aunt    Breast cancer Cousin    Cancer Cousin        unspecified type   Breast cancer Other        triple negative breast cancer in her 10s   Colon polyps Neg Hx    Esophageal cancer Neg Hx    Gallbladder disease Neg Hx     Social History Social History   Tobacco Use   Smoking status: Former    Packs/day: 1.00    Years: 20.00    Pack years: 20.00    Types: Cigarettes    Quit date: 07/30/2011    Years since quitting: 9.7   Smokeless tobacco: Never   Tobacco comments:    Quit >4 years ago; 1 ppd for about 5/20 years (remaining was less)  Vaping Use   Vaping Use: Former  Substance Use Topics   Alcohol use: No    Alcohol/week: 0.0 standard drinks   Drug use: No     Allergies   Bacitracin-neomycin-polymyxin  [neomycin-bacitracin zn-polymyx], Nsaids, Tape, Ambien [zolpidem tartrate], Amoxicillin, Clavulanic  acid, Contrast media [iodinated diagnostic agents], Latex, and Prednisone   Review of Systems Review of  Systems  Constitutional:  Positive for activity change. Negative for appetite change, fatigue and fever.  Respiratory:  Negative for cough and shortness of breath.   Cardiovascular:  Negative for chest pain.  Gastrointestinal:  Positive for abdominal pain, diarrhea, nausea and vomiting.  Skin:  Positive for rash.  Neurological:  Negative for dizziness, light-headedness and headaches.    Physical Exam Triage Vital Signs ED Triage Vitals  Enc Vitals Group     BP 04/30/21 1300 (!) 152/73     Pulse Rate 04/30/21 1300 92     Resp 04/30/21 1300 20     Temp 04/30/21 1300 98.7 F (37.1 C)     Temp Source 04/30/21 1300 Oral     SpO2 04/30/21 1300 100 %     Weight --      Height --      Head Circumference --      Peak Flow --      Pain Score 04/30/21 1259 0     Pain Loc --      Pain Edu? --      Excl. in [place]? --    No data found.  Updated Vital Signs BP (!) 152/73 (BP Location: Right Arm)   Pulse 92   Temp 98.7 F (37.1 C) (Oral)   Resp 20   SpO2 100%   Visual Acuity Right Eye Distance:   Left Eye Distance:   Bilateral Distance:    Right Eye Near:   Left Eye Near:    Bilateral Near:     Physical Exam Vitals reviewed.  Constitutional:      General: She is awake. She is not in acute distress.    Appearance: Normal appearance. She is well-developed. She is not ill-appearing.     Comments: Very pleasant female appears stated age obviously uncomfortable in no acute distress  HENT:     Head: Normocephalic and atraumatic.     Mouth/Throat:     Mouth: Mucous membranes are moist.     Pharynx: Uvula midline. No oropharyngeal exudate or posterior oropharyngeal erythema.  Cardiovascular:     Rate and Rhythm: Normal rate and regular rhythm.     Heart sounds: Normal heart sounds, S1 normal and S2 normal. No murmur heard. Pulmonary:     Effort: Pulmonary effort is normal.      Breath sounds: Normal breath sounds. No wheezing, rhonchi or rales.     Comments: Clear to auscultation bilaterally Abdominal:     General: Bowel sounds are normal.     Palpations: Abdomen is soft.     Tenderness: There is abdominal tenderness in the right upper quadrant and epigastric area. There is guarding. There is no right CVA tenderness, left CVA tenderness or rebound. Positive signs include Murphy's sign.     Comments: Significant tenderness palpation throughout abdomen but worse in upper abdomen.  Positive Murphy sign.  Psychiatric:        Behavior: Behavior is cooperative.     UC Treatments / Results  Labs (all labs ordered are listed, but only abnormal results are displayed) Labs Reviewed  POCT URINALYSIS DIPSTICK, ED / UC - Abnormal; Notable for the following components:      Result Value   Ketones, ur TRACE (*)    All other components within normal limits    EKG   Radiology No results found.  Procedures Procedures (including critical care time)  Medications Ordered in UC Medications - No data to display  Initial Impression / Assessment and Plan /  UC Course  I have reviewed the triage vital signs and the nursing notes.  Pertinent labs & imaging results that were available during my care of the patient were reviewed by me and considered in my medical decision making (see chart for details).      Concern for gallbladder etiology given the clinical presentation.  Discussed that we do not have imaging capabilities in urgent care and that she would need to arrange imaging for further investigation of symptoms.  Offered patient basic blood work including CBC and CMP here and to attempt to schedule appointment with gastroenterologist within the next 24 to 48 hours since she is an established patient.  Patient does not believe this to be possible and preferred to go to the emergency room given ongoing severe symptoms.  This is appropriate given pain on exam today.   Vital signs were stable at the time of discharge and patient is safe for private transport.  She will go directly to the emergency room at [facility] for further evaluation and management following visit today.  Final Clinical Impressions(s) / UC Diagnoses   Final diagnoses:  Generalized abdominal pain  Upper abdominal pain  Nausea  Pruritus  Acholic stool  Loose stools     Discharge Instructions      Go to the emergency room for further evaluation and management as we discussed.     ED Prescriptions   None    PDMP not reviewed this encounter.   Terrilee Croak, PA-C 04/30/21 1414

## 2021-04-30 NOTE — Discharge Instructions (Addendum)
Go to the emergency room for further evaluation and management as we discussed.

## 2021-05-01 ENCOUNTER — Telehealth: Payer: Self-pay

## 2021-05-01 NOTE — Telephone Encounter (Signed)
Pt called she was seen in the ER last night she is having itching and nausea and the ER sent a cream over for itching but nothing for nausea and pt is wanting something called in? Pt said Dr Birdie Riddle knew all about it. I have made an appt for ER follow up on Friday.   Pt call back [phone]

## 2021-05-01 NOTE — Telephone Encounter (Signed)
Do you mind sending in something for nausea?

## 2021-05-02 ENCOUNTER — Telehealth: Payer: Medicare Other

## 2021-05-02 ENCOUNTER — Telehealth: Payer: Self-pay

## 2021-05-02 NOTE — Progress Notes (Deleted)
Chronic Care Management Pharmacy Note  05/02/2021 Name:  Tracy [name] MRN:  [id number] DOB:  [birth year]  Summary: ***  Subjective: Tracy [name] is an 74 y.o. year old female who is a primary patient of [name], Aundra Millet, MD.  The CCM team was consulted for assistance with disease management and care coordination needs.    {CCMTELEPHONEFACETOFACE:21091510} for {CCMINITIALFOLLOWUPCHOICE:21091511} in response to provider referral for pharmacy case management and/or care coordination services.   Consent to Services:  {CCMCONSENTOPTIONS:25074}  Patient Care Team: [name], MD as PCP - General (Family Medicine) [name], MD as Attending Physician (Physical Medicine and Rehabilitation) [name], MD as Consulting Physician (Orthopedic Surgery) [name], MD as Consulting Physician (Otolaryngology) [name], MD as Consulting Physician (Gastroenterology) [name], MD as Consulting Physician (General Surgery) [name], [facility] as Pharmacist (Pharmacist)  Hospital visits: {Hospital DC Yes/No:21091515}  Objective:  Lab Results  Component Value Date   CREATININE 1.65 (H) 04/30/2021   CREATININE 1.51 (H) 04/10/2021   CREATININE 1.69 (H) 04/04/2021    Lab Results  Component Value Date   HGBA1C 7.1 (H) 04/10/2021   HGBA1C 6.4 08/29/2020   HGBA1C 7.0 (H) 01/13/2020   Last diabetic Eye exam:  Lab Results  Component Value Date/Time   HMDIABEYEEXA No Retinopathy 05/20/2019 12:00 AM    Last diabetic Foot exam: No results found for: HMDIABFOOTEX      Component Value Date/Time   CHOL 188 04/10/2021 1006   CHOL 184 08/29/2020 1139   CHOL 127 01/13/2020 1352   TRIG 167.0 (H) 04/10/2021 1006   TRIG 137.0 08/29/2020 1139   TRIG 129.0 01/13/2020 1352   HDL 79.20 04/10/2021 1006   HDL 74.00 08/29/2020 1139   HDL 44.00 01/13/2020 1352   CHOLHDL 2 04/10/2021 1006   VLDL 33.4 04/10/2021 1006   LDLCALC 75 04/10/2021 1006   LDLCALC 82  08/29/2020 1139   LDLCALC 58 01/13/2020 1352   LDLDIRECT 154.7 04/05/2013 0906   LDLDIRECT 130.4 04/02/2012 0857    Hepatic Function Latest Ref Rng & Units 04/30/2021 04/10/2021 04/04/2021  Total Protein 6.5 - 8.1 g/dL 7.7 6.8 6.5  Albumin 3.5 - 5.0 g/dL 4.8 4.2 3.5  AST 15 - 41 U/L _0 ALT 0 - 44 U/L _1 Alk Phosphatase 38 - 126 U/L 74 72 80  Total Bilirubin 0.3 - 1.2 mg/dL 0.3 0.4 0.2(L)  Bilirubin, Direct 0.0 - 0.3 mg/dL - 0.1 -    Lab Results  Component Value Date/Time   TSH 2.23 04/10/2021 10:06 AM   TSH 2.05 08/29/2020 11:39 AM    CBC Latest Ref Rng & Units 04/30/2021 04/04/2021 03/16/2021  WBC 4.0 - 10.5 K/uL 5.4 5.2 5.3  Hemoglobin 12.0 - 15.0 g/dL 11.4(L) 8.8(L) 8.5(L)  Hematocrit 36.0 - 46.0 % 34.5(L) 26.6(L) 26.6(L)  Platelets 150 - 400 K/uL 350 274 184    Lab Results  Component Value Date/Time   VD25OH 71.18 04/10/2021 10:06 AM   VD25OH 35.64 06/03/2018 10:32 AM    Clinical ASCVD:  The ASCVD Risk score (Arnett DK, et al., 2019) failed to calculate for the following reasons:   The patient has a prior MI or stroke diagnosis   Social History   Tobacco Use  Smoking Status Former   Packs/day: 1.00   Years: 20.00   Pack years: 20.00   Types: Cigarettes   Quit date: 07/30/2011   Years since quitting: 9.7  Smokeless Tobacco Never  Tobacco Comments  Quit >4 years ago; 1 ppd for about 5/20 years (remaining was less)   BP Readings from Last 3 Encounters:  04/30/21 (!) 164/64  04/30/21 (!) 152/73  04/20/21 130/70   Pulse Readings from Last 3 Encounters:  04/30/21 83  04/30/21 92  04/20/21 91   Wt Readings from Last 3 Encounters:  04/30/21 181 lb 3.5 oz (82.2 kg)  04/20/21 181 lb 3.2 oz (82.2 kg)  04/10/21 186 lb 3.2 oz (84.5 kg)   BMI Readings from Last 3 Encounters:  04/30/21 33.15 kg/m  04/20/21 33.14 kg/m  04/10/21 34.06 kg/m    Assessment: Review of patient past medical history, allergies, medications, health status, including review  of consultants reports, laboratory and other test data, was performed as part of comprehensive evaluation and provision of chronic care management services.   SDOH:  (Social Determinants of Health) assessments and interventions performed: ***   CCM Care Plan  Allergies  Allergen Reactions   Bacitracin-Neomycin-Polymyxin  [Neomycin-Bacitracin Zn-Polymyx] Swelling   Nsaids Other (See Comments)    nosebleeds   Tape Hives    Pt cannot tolerate bandaids, tape, or any other adhesives.    Ambien [Zolpidem Tartrate]     Hives    Amoxicillin     Rash    Clavulanic Acid Hives   Contrast Media [Iodinated Diagnostic Agents] Hives    Pt states hives with prior ct, was given benadryl to resolve   Latex Swelling   Prednisone Swelling    Pt tolerates Dexamethasone. Throat swelling    Medications Reviewed Today     Reviewed by Mercer Pod, RN (Registered Nurse) on 04/30/21 at 1546  Med List Status: <None>   Medication Order Taking? Sig Documenting Provider Last Dose Status Informant  Accu-Chek Softclix Lancets lancets [id number]  Use as instructed to check sugars 1-2 times daily. [name], MD  Active Self  acyclovir (ZOVIRAX) 400 MG tablet [id number]  TAKE 1 TABLET BY MOUTH  TWICE DAILY Irene Limbo Cloria Spring, MD  Active   albuterol (VENTOLIN HFA) 108 (90 Base) MCG/ACT inhaler [id number]  Inhale 2 puffs into the lungs every 6 (six) hours as needed for wheezing or shortness of breath. Harle Stanford., PA-C  Active Self  atorvastatin (LIPITOR) 10 MG tablet [id number]  TAKE 1 TABLET BY MOUTH  DAILY [name], MD  Active   B Complex-C (B-COMPLEX WITH VITAMIN C) tablet [id number]  Take 1 tablet by mouth in the morning. [provider]  Active Self  dexamethasone (DECADRON) 4 MG tablet [id number]  Take 3 tabs with breakfast the day after each treatment. [name], MD  Active Self  glucose blood (ACCU-CHEK GUIDE) test strip [id number]  Use as instructed to check  sugars 1-2 times daily. [name], MD  Active Self  hydrOXYzine (ATARAX/VISTARIL) 10 MG tablet [id number]  Take 0.5-1 tablets (5-10 mg total) by mouth 3 (three) times daily as needed. [name], NP  Active   lidocaine-prilocaine (EMLA) cream [id number]  APPLY 1 APPLICATION TOPICALLY AS NEEDED.  Patient taking differently: Apply 1 application topically as needed (prior to treatments).   [name], MD  Active Self  loratadine (CLARITIN) 10 MG tablet [id number]  Take 10 mg by mouth daily. [provider]  Active Self  meclizine (ANTIVERT) 25 MG tablet [id number]  Take 1 tablet (25 mg total) by mouth 3 (three) times daily as needed for dizziness. Gardenia Phlegm, NP  Active Self  metFORMIN (GLUCOPHAGE) 500 MG tablet [id number]  TAKE 1 TABLET(500  MG) BY MOUTH TWICE DAILY WITH A MEAL [name], MD  Active Self  mometasone (NASONEX) 50 MCG/ACT nasal spray [id number]  Place 2 sprays into the nose daily.  Patient taking differently: Place 2 sprays into the nose daily as needed (allergies.).   [name], MD  Expired 03/16/21 2359 Self           Med Note Hardie Pulley, AMMIE J   Tue Aug 29, 2020  8:48 AM) Changed to as needed  Multiple Vitamin (MULTIVITAMIN WITH MINERALS) TABS tablet [id number]  Take 1 tablet by mouth daily. Centrum for Women 50+ [provider]  Active Self  ondansetron (ZOFRAN) 8 MG tablet [id number]  Take 1 tablet (8 mg total) by mouth 2 (two) times daily as needed (Nausea or vomiting). [name], MD  Active Self  pantoprazole (PROTONIX) 40 MG tablet [id number]  TAKE 1 TABLET BY MOUTH  TWICE DAILY  Patient taking differently: TAKE 1 TABLET BY MOUTH  TWICE DAILY   [name], MD  Active Self  potassium chloride SA (KLOR-CON) 20 MEQ tablet [id number]  TAKE 1 TABLET BY MOUTH  TWICE DAILY [name], MD  Active   pregabalin (LYRICA) 50 MG capsule [id number]  TAKE 1 CAPSULE(50 MG) BY MOUTH THREE TIMES DAILY  [name], MD  Active Self  prochlorperazine (COMPAZINE) 10 MG tablet [id number]  Take 1 tablet (10 mg total) by mouth every 6 (six) hours as needed (Nausea or vomiting). [name], MD  Active Self  tiZANidine (ZANAFLEX) 4 MG tablet [id number]  TAKE 1 TABLET(4 MG) BY MOUTH AT BEDTIME [name], MD  Active Self  traZODone (DESYREL) 100 MG tablet [id number]  TAKE 1 TABLET BY MOUTH AT  BEDTIME [name], MD  Active Self  Vitamin D, Ergocalciferol, (DRISDOL) 1.25 MG (50000 UNIT) CAPS capsule [id number]  TAKE 1 CAPSULE BY MOUTH 1 TIME A WEEK  Patient taking differently: Take 50,000 Units by mouth every Sunday.   [name], MD  Active Self            Patient Active Problem List   Diagnosis Date Noted   Bone metastases ([place]) 02/08/2021   Angiodysplasia of intestine 08/23/2020   Port-A-Cath in place 08/04/2020   Counseling regarding advance care planning and goals of care 02/07/2020   Multiple myeloma ([place]) 01/25/2020   Dizziness 10/12/2019   Post-nasal drainage 10/12/2019   Allergic rhinitis 08/19/2018   Type 2 diabetes mellitus with diabetic neuropathy, unspecified ([place]) 11/05/2017   Encounter for long-term use of muscle relaxants 09/24/2017   CAD in native artery 09/24/2017   OSA (obstructive sleep apnea) 09/24/2017   Snorings 09/24/2017   Asthenia 06/30/2017   Morbid obesity ([place]) 06/02/2017   Depression 06/02/2017   Iron deficiency anemia 09/23/2016   Anemia of chronic disease 09/28/2015   Genetic testing 08/21/2015   History of left breast cancer 07/13/2015   History of right breast cancer 06/20/2015   Hearing loss due to cerumen impaction 12/29/2014   Allergy to adhesive tape 05/19/2014   Hyperlipidemia 12/06/2013   GERD (gastroesophageal reflux disease) 12/06/2013   Cervical disc disease 11/11/2013   Osteopenia 05/25/2013   Allergic asthma 12/24/2012   Insomnia 04/02/2012   Physical exam, annual 04/02/2012   HTN  (hypertension) 02/03/2012   Vertigo, benign positional 02/03/2012   Left groin pain 02/03/2012   Hip pain 10/10/2011   TIA (transient ischemic attack) 07/24/2011   Allergic reaction 07/05/2011   Osteoarthrosis involving lower leg  10/19/2010   Disorder of bone and cartilage 05/01/2010   Generalized anxiety disorder 05/01/2010   Vitamin D deficiency 02/20/2010   Unspecified chronic bronchitis ([place]) 08/24/2009   Other lymphedema 10/29/2007    Immunization History  Administered Date(s) Administered   Fluad Quad(high Dose 65+) 05/31/2020   Influenza Split 04/28/2010, 06/24/2011   Influenza Whole 04/22/2013   Influenza, High Dose Seasonal PF 05/30/2016, 05/20/2018, 04/09/2019   Influenza,inj,Quad PF,6+ Mos 04/29/2015, 05/16/2017   Influenza-Unspecified 05/03/2014   Moderna Sars-Covid-2 Vaccination 09/27/2019, 10/25/2019, 06/05/2020   Pneumococcal Conjugate-13 06/01/2015   Pneumococcal Polysaccharide-23 05/10/2014   Td 04/05/2013   Zoster, Live 05/01/2010    Conditions to be addressed/monitored: ***  There are no care plans that you recently modified to display for this patient.  Current Barriers:  {pharmacybarriers:24917}  Pharmacist Clinical Goal(s):  Patient will {PHARMACYGOALCHOICES:24921} through collaboration with PharmD and provider.   Interventions: 1:1 collaboration with [name], MD regarding development and update of comprehensive plan of care as evidenced by provider attestation and co-signature Inter-disciplinary care team collaboration (see longitudinal plan of care) Comprehensive medication review performed; medication list updated in electronic medical record  {CCM [facility] DISEASE STATES:25130}  Patient Goals/Self-Care Activities Patient will:  - {pharmacypatientgoals:24919}  Medication Assistance: {MEDASSISTANCEINFO:25044}  Patient's preferred pharmacy is:  [id number] [address] [place], [place] - [address] AT [address] [place] [address] Phone: [phone] Fax: [phone]  [facility]  ([place], [address] [place] [address] Phone: [phone] Fax: [phone]  [facility] [phone] @ [place], [place] AT [address] [place] [address] Phone: [phone] Fax: [phone]  [facility] ([facility]) - [address], [address] [place] [address] Phone: [phone] Fax: [phone]   Pt endorses ***% compliance  Follow Up:  Patient agrees to Care Plan and Follow-up. Plan: [facility] ***. Pharmacist ***  Future Appointments  Date Time Provider Sentinel  05/03/2021  9:30 AM [facility] [place] None  05/03/2021 10:00 AM [name], MD [facility] None  05/03/2021 11:00 AM [facility] INFUSION [facility] None  05/04/2021  9:30 AM [name], MD [facility] PEC  05/31/2021  8:30 AM [facility] [place] FLUSH [facility] None  05/31/2021  9:00 AM [name], MD [facility] None  05/31/2021 10:00 AM [facility] INFUSION [facility] None  06/28/2021  9:30 AM [facility] [place] FLUSH [facility] None  06/28/2021 10:00 AM [name], MD [facility] None  06/28/2021 11:00 AM [facility] INFUSION [facility] None  07/26/2021 10:00 AM [facility] INFUSION [facility] None  09/20/2021 10:00 AM [facility] INFUSION [facility] None    {jpsigs:26361}

## 2021-05-02 NOTE — Telephone Encounter (Signed)
  Chronic Care Management  Outreach Note   Name: Tracy [name] MRN: [id number] DOB: Sep 04, 1945  Referred by: [name], MD Reason for referral: Telephone Appointment with [place] [facility] Pharmacist, [name].   An unsuccessful telephone outreach was attempted today. The patient was referred to the pharmacist for assistance with care management and care coordination.   Telephone appointment with clinical pharmacist today (05/02/2021) at 330pm. If patient immediately returns call, transfer to [phone]. Otherwise, please provide this number so patient can reschedule visit.   [name], PharmD, [name] Clinical Pharmacist Practitioner  [facility]  [phone]

## 2021-05-03 ENCOUNTER — Other Ambulatory Visit: Payer: Self-pay

## 2021-05-03 ENCOUNTER — Inpatient Hospital Stay: Payer: Medicare Other

## 2021-05-03 ENCOUNTER — Inpatient Hospital Stay: Payer: Medicare Other | Attending: Hematology

## 2021-05-03 ENCOUNTER — Inpatient Hospital Stay (HOSPITAL_BASED_OUTPATIENT_CLINIC_OR_DEPARTMENT_OTHER): Payer: Medicare Other | Admitting: Hematology

## 2021-05-03 VITALS — BP 121/49 | HR 79 | Resp 16

## 2021-05-03 VITALS — BP 136/50 | HR 77 | Temp 97.8°F | Resp 18 | Ht 62.0 in | Wt 178.8 lb

## 2021-05-03 DIAGNOSIS — C7951 Secondary malignant neoplasm of bone: Secondary | ICD-10-CM

## 2021-05-03 DIAGNOSIS — Z8673 Personal history of transient ischemic attack (TIA), and cerebral infarction without residual deficits: Secondary | ICD-10-CM

## 2021-05-03 DIAGNOSIS — D509 Iron deficiency anemia, unspecified: Secondary | ICD-10-CM | POA: Insufficient documentation

## 2021-05-03 DIAGNOSIS — Z87891 Personal history of nicotine dependence: Secondary | ICD-10-CM | POA: Insufficient documentation

## 2021-05-03 DIAGNOSIS — Z923 Personal history of irradiation: Secondary | ICD-10-CM | POA: Diagnosis not present

## 2021-05-03 DIAGNOSIS — Z5112 Encounter for antineoplastic immunotherapy: Secondary | ICD-10-CM | POA: Insufficient documentation

## 2021-05-03 DIAGNOSIS — E1140 Type 2 diabetes mellitus with diabetic neuropathy, unspecified: Secondary | ICD-10-CM | POA: Diagnosis not present

## 2021-05-03 DIAGNOSIS — C9000 Multiple myeloma not having achieved remission: Secondary | ICD-10-CM | POA: Diagnosis not present

## 2021-05-03 DIAGNOSIS — E1122 Type 2 diabetes mellitus with diabetic chronic kidney disease: Secondary | ICD-10-CM | POA: Insufficient documentation

## 2021-05-03 DIAGNOSIS — E538 Deficiency of other specified B group vitamins: Secondary | ICD-10-CM | POA: Insufficient documentation

## 2021-05-03 DIAGNOSIS — Z7189 Other specified counseling: Secondary | ICD-10-CM

## 2021-05-03 DIAGNOSIS — N189 Chronic kidney disease, unspecified: Secondary | ICD-10-CM | POA: Diagnosis not present

## 2021-05-03 DIAGNOSIS — Z853 Personal history of malignant neoplasm of breast: Secondary | ICD-10-CM | POA: Insufficient documentation

## 2021-05-03 DIAGNOSIS — I252 Old myocardial infarction: Secondary | ICD-10-CM | POA: Diagnosis not present

## 2021-05-03 DIAGNOSIS — Z79899 Other long term (current) drug therapy: Secondary | ICD-10-CM | POA: Insufficient documentation

## 2021-05-03 LAB — CBC WITH DIFFERENTIAL (CANCER CENTER ONLY)
Abs Immature Granulocytes: 0.01 10*3/uL (ref 0.00–0.07)
Basophils Absolute: 0 10*3/uL (ref 0.0–0.1)
Basophils Relative: 0 %
Eosinophils Absolute: 0.2 10*3/uL (ref 0.0–0.5)
Eosinophils Relative: 3 %
HCT: 30.4 % — ABNORMAL LOW (ref 36.0–46.0)
Hemoglobin: 10 g/dL — ABNORMAL LOW (ref 12.0–15.0)
Immature Granulocytes: 0 %
Lymphocytes Relative: 27 %
Lymphs Abs: 1.2 10*3/uL (ref 0.7–4.0)
MCH: 31.3 pg (ref 26.0–34.0)
MCHC: 32.9 g/dL (ref 30.0–36.0)
MCV: 95.3 fL (ref 80.0–100.0)
Monocytes Absolute: 0.3 10*3/uL (ref 0.1–1.0)
Monocytes Relative: 6 %
Neutro Abs: 2.8 10*3/uL (ref 1.7–7.7)
Neutrophils Relative %: 64 %
Platelet Count: 288 10*3/uL (ref 150–400)
RBC: 3.19 MIL/uL — ABNORMAL LOW (ref 3.87–5.11)
RDW: 14.6 % (ref 11.5–15.5)
WBC Count: 4.5 10*3/uL (ref 4.0–10.5)
nRBC: 0 % (ref 0.0–0.2)

## 2021-05-03 LAB — CMP (CANCER CENTER ONLY)
ALT: 13 U/L (ref 0–44)
AST: 21 U/L (ref 15–41)
Albumin: 3.8 g/dL (ref 3.5–5.0)
Alkaline Phosphatase: 75 U/L (ref 38–126)
Anion gap: 10 (ref 5–15)
BUN: 13 mg/dL (ref 8–23)
CO2: 19 mmol/L — ABNORMAL LOW (ref 22–32)
Calcium: 9 mg/dL (ref 8.9–10.3)
Chloride: 112 mmol/L — ABNORMAL HIGH (ref 98–111)
Creatinine: 1.69 mg/dL — ABNORMAL HIGH (ref 0.44–1.00)
GFR, Estimated: 31 mL/min — ABNORMAL LOW (ref >60)
Glucose, Bld: 121 mg/dL — ABNORMAL HIGH (ref 70–99)
Potassium: 4.6 mmol/L (ref 3.5–5.1)
Sodium: 141 mmol/L (ref 135–145)
Total Bilirubin: 0.3 mg/dL (ref 0.3–1.2)
Total Protein: 6.7 g/dL (ref 6.5–8.1)

## 2021-05-03 LAB — IRON AND TIBC
Iron: 57 ug/dL (ref 41–142)
Saturation Ratios: 20 % — ABNORMAL LOW (ref 21–57)
TIBC: 290 ug/dL (ref 236–444)
UIBC: 233 ug/dL (ref 120–384)

## 2021-05-03 LAB — FERRITIN: Ferritin: 51 ng/mL (ref 11–307)

## 2021-05-03 LAB — VITAMIN B12: Vitamin B-12: 2059 pg/mL — ABNORMAL HIGH (ref 180–914)

## 2021-05-03 MED ORDER — SODIUM CHLORIDE 0.9% FLUSH
10.0000 mL | INTRAVENOUS | Status: DC | PRN
  Administered 2021-05-03: 10 mL

## 2021-05-03 MED ORDER — SODIUM CHLORIDE 0.9 % IV SOLN: Freq: Once | INTRAVENOUS | Status: AC

## 2021-05-03 MED ORDER — CYANOCOBALAMIN 1000 MCG/ML IJ SOLN
1000.0000 ug | Freq: Once | INTRAMUSCULAR | Status: AC
  Administered 2021-05-03: 1000 ug via INTRAMUSCULAR
  Filled 2021-05-03: qty 1

## 2021-05-03 MED ORDER — FAMOTIDINE 20 MG IN NS 100 ML IVPB
20.0000 mg | Freq: Once | INTRAVENOUS | Status: AC
  Administered 2021-05-03: 20 mg via INTRAVENOUS
  Filled 2021-05-03: qty 100

## 2021-05-03 MED ORDER — SODIUM CHLORIDE 0.9 % IV SOLN
20.0000 mg | Freq: Once | INTRAVENOUS | Status: AC
  Administered 2021-05-03: 20 mg via INTRAVENOUS
  Filled 2021-05-03: qty 20

## 2021-05-03 MED ORDER — DIPHENHYDRAMINE HCL 25 MG PO CAPS
50.0000 mg | ORAL_CAPSULE | Freq: Once | ORAL | Status: AC
  Administered 2021-05-03: 50 mg via ORAL
  Filled 2021-05-03: qty 2

## 2021-05-03 MED ORDER — ACETAMINOPHEN 325 MG PO TABS
650.0000 mg | ORAL_TABLET | Freq: Once | ORAL | Status: AC
  Administered 2021-05-03: 650 mg via ORAL
  Filled 2021-05-03: qty 2

## 2021-05-03 MED ORDER — SODIUM CHLORIDE 0.9 % IV SOLN
16.0000 mg/kg | Freq: Once | INTRAVENOUS | Status: AC
  Administered 2021-05-03: 1400 mg via INTRAVENOUS
  Filled 2021-05-03: qty 10

## 2021-05-03 MED ORDER — HEPARIN SOD (PORK) LOCK FLUSH 100 UNIT/ML IV SOLN
500.0000 [IU] | Freq: Once | INTRAVENOUS | Status: AC | PRN
  Administered 2021-05-03: 500 [IU]

## 2021-05-03 MED ORDER — SODIUM CHLORIDE 0.9% FLUSH
10.0000 mL | Freq: Once | INTRAVENOUS | Status: AC | PRN
  Administered 2021-05-03: 10 mL

## 2021-05-03 MED ORDER — ALTEPLASE 2 MG IJ SOLR
2.0000 mg | Freq: Once | INTRAMUSCULAR | Status: AC | PRN
  Administered 2021-05-03: 2 mg
  Filled 2021-05-03: qty 2

## 2021-05-03 MED ORDER — ONDANSETRON HCL 4 MG/2ML IJ SOLN
4.0000 mg | Freq: Once | INTRAMUSCULAR | Status: AC
  Administered 2021-05-03: 4 mg via INTRAVENOUS
  Filled 2021-05-03: qty 2

## 2021-05-03 NOTE — Telephone Encounter (Signed)
Based on her medication record, she should have Zofran available (Ondansetron) for nausea.  If she is out, we can send a refill.  If she feels that this isn't working, we can send Promethazine 25mg  1 tab TID prn, #20, no refills

## 2021-05-03 NOTE — Progress Notes (Signed)
Cr 1.69 per Dr Irene Limbo ok for treatment today.

## 2021-05-03 NOTE — Progress Notes (Signed)
After receiving IV ondansetron, patient verbalized complete resolution of nausea.

## 2021-05-03 NOTE — Patient Instructions (Signed)
[facility]   Discharge Instructions: Thank you for choosing [facility] to provide your oncology and hematology care.   If you have a lab appointment with the Cancer Center, please go directly to the Cancer Center and check in at the registration area.   Wear comfortable clothing and clothing appropriate for easy access to any Portacath or PICC line.   We strive to give you quality time with your provider. You may need to reschedule your appointment if you arrive late (15 or more minutes).  Arriving late affects you and other patients whose appointments are after yours.  Also, if you miss three or more appointments without notifying the office, you may be dismissed from the clinic at the provider's discretion.      For prescription refill requests, have your pharmacy contact our office and allow 72 hours for refills to be completed.    Today you received the following chemotherapy and/or immunotherapy agents: daratumumab      To help prevent nausea and vomiting after your treatment, we encourage you to take your nausea medication as directed.  BELOW ARE SYMPTOMS THAT SHOULD BE REPORTED IMMEDIATELY: *FEVER GREATER THAN 100.4 F (38 C) OR HIGHER *CHILLS OR SWEATING *NAUSEA AND VOMITING THAT IS NOT CONTROLLED WITH YOUR NAUSEA MEDICATION *UNUSUAL SHORTNESS OF BREATH *UNUSUAL BRUISING OR BLEEDING *URINARY PROBLEMS (pain or burning when urinating, or frequent urination) *BOWEL PROBLEMS (unusual diarrhea, constipation, pain near the anus) TENDERNESS IN MOUTH AND THROAT WITH OR WITHOUT PRESENCE OF ULCERS (sore throat, sores in mouth, or a toothache) UNUSUAL RASH, SWELLING OR PAIN  UNUSUAL VAGINAL DISCHARGE OR ITCHING   Items with * indicate a potential emergency and should be followed up as soon as possible or go to the Emergency Department if any problems should occur.  Please show the CHEMOTHERAPY ALERT CARD or IMMUNOTHERAPY ALERT CARD at check-in  to the Emergency Department and triage nurse.  Should you have questions after your visit or need to cancel or reschedule your appointment, please contact [facility]  Dept: [phone]  and follow the prompts.  Office hours are 8:00 a.m. to 4:30 p.m. Monday - Friday. Please note that voicemails left after 4:00 p.m. may not be returned until the following business day.  We are closed weekends and major holidays. You have access to a nurse at all times for urgent questions. Please call the main number to the clinic Dept: [phone] and follow the prompts.   For any non-urgent questions, you may also contact your provider using MyChart. We now offer e-Visits for anyone 18 and older to request care online for non-urgent symptoms. For details visit [email or web address].   Also download the MyChart app! Go to the app store, search "MyChart", open the app, select [facility], and log in with your MyChart username and password.  Due to Covid, a mask is required upon entering the hospital/clinic. If you do not have a mask, one will be given to you upon arrival. For doctor visits, patients may have 1 support person aged 18 or older with them. For treatment visits, patients cannot have anyone with them due to current Covid guidelines and our immunocompromised population.   

## 2021-05-03 NOTE — Telephone Encounter (Signed)
Left voicemail for patient to return call.

## 2021-05-04 ENCOUNTER — Ambulatory Visit (INDEPENDENT_AMBULATORY_CARE_PROVIDER_SITE_OTHER): Payer: Medicare Other | Admitting: Family Medicine

## 2021-05-04 ENCOUNTER — Encounter: Payer: Self-pay | Admitting: Family Medicine

## 2021-05-04 VITALS — BP 114/60 | HR 73 | Temp 97.1°F | Resp 16 | Wt 179.4 lb

## 2021-05-04 DIAGNOSIS — L299 Pruritus, unspecified: Secondary | ICD-10-CM | POA: Diagnosis not present

## 2021-05-04 DIAGNOSIS — F4321 Adjustment disorder with depressed mood: Secondary | ICD-10-CM

## 2021-05-04 DIAGNOSIS — F32A Depression, unspecified: Secondary | ICD-10-CM

## 2021-05-04 DIAGNOSIS — Z7189 Other specified counseling: Secondary | ICD-10-CM

## 2021-05-04 DIAGNOSIS — C9000 Multiple myeloma not having achieved remission: Secondary | ICD-10-CM

## 2021-05-04 DIAGNOSIS — R1010 Upper abdominal pain, unspecified: Secondary | ICD-10-CM | POA: Diagnosis not present

## 2021-05-04 DIAGNOSIS — R110 Nausea: Secondary | ICD-10-CM | POA: Diagnosis not present

## 2021-05-04 LAB — ERYTHROPOIETIN: Erythropoietin: 18.6 m[IU]/mL — ABNORMAL HIGH (ref 2.6–18.5)

## 2021-05-04 MED ORDER — DICYCLOMINE HCL 10 MG PO CAPS: 10.0000 mg | ORAL_CAPSULE | Freq: Three times a day (TID) | ORAL | 0 refills | Status: DC | PRN

## 2021-05-04 MED ORDER — PANTOPRAZOLE SODIUM 40 MG PO TBEC: 40.0000 mg | DELAYED_RELEASE_TABLET | Freq: Two times a day (BID) | ORAL | 0 refills | Status: DC

## 2021-05-04 MED ORDER — ONDANSETRON HCL 8 MG PO TABS: 8.0000 mg | ORAL_TABLET | Freq: Two times a day (BID) | ORAL | 1 refills | Status: DC | PRN

## 2021-05-04 MED ORDER — HYDROXYZINE PAMOATE 25 MG PO CAPS: 25.0000 mg | ORAL_CAPSULE | Freq: Three times a day (TID) | ORAL | 0 refills | Status: DC | PRN

## 2021-05-04 MED ORDER — SUCRALFATE 1 G PO TABS: 1.0000 g | ORAL_TABLET | Freq: Three times a day (TID) | ORAL | 1 refills | Status: DC

## 2021-05-04 NOTE — Progress Notes (Signed)
Subjective:    Patient ID: Tracy [name], female    DOB: July 14, 1946, 75 y.o.   MRN: [id number]  HPI ER f/u- pt was seen on 10/3 w/ abd pain and nausea.  ER work up was negative- labs were stable and CT scan unrevealing.  Pt reports she did not eat Tues, Wed b/c of nausea.  She states she would be doubled over for about an hour, moaning in pain.  Has not been taking Protonix.  Yesterday went to [employer] after tx and had fish sandwich w/ fries.  Only had slight nausea.  Pt has been out of nausea medications.  Itching- pt reports she is itching from 'the top of my head to the bottoms of my feet'.  Pt reports she took Hydroxyzine for 'a few days' but thought it made the nausea worse and didn't seem to impact the itching.  Dr Irene Limbo told pt that her itching is not consistent w/ tx side effect.  She took the hydroxyzine last night and today is not itching nearly as bad as she was.  Also took Benadryl and Claritin.  Depression- pt's PHQ9=15 today.  Pt is very frustrated, very angry w/ her multiple health issues.  She feels mood will improve as health situation improves.   Review of Systems For ROS see HPI   This visit occurred during the SARS-CoV-2 public health emergency.  Safety protocols were in place, including screening questions prior to the visit, additional usage of staff PPE, and extensive cleaning of exam room while observing appropriate contact time as indicated for disinfecting solutions.      Objective:   Physical Exam Constitutional:      General: She is not in acute distress.    Appearance: Normal appearance. She is well-developed. She is not ill-appearing.  HENT:     Head: Normocephalic and atraumatic.  Eyes:     Conjunctiva/sclera: Conjunctivae normal.     Pupils: Pupils are equal, round, and reactive to light.  Neck:     Thyroid: No thyromegaly.  Cardiovascular:     Rate and Rhythm: Normal rate and regular rhythm.     Pulses: Normal pulses.     Heart sounds: Normal heart  sounds. No murmur heard. Pulmonary:     Effort: Pulmonary effort is normal. No respiratory distress.     Breath sounds: Normal breath sounds.  Abdominal:     General: There is no distension.     Palpations: Abdomen is soft.     Tenderness: There is no abdominal tenderness.  Musculoskeletal:     Cervical back: Normal range of motion and neck supple.     Right lower leg: No edema.     Left lower leg: No edema.  Lymphadenopathy:     Cervical: No cervical adenopathy.  Skin:    General: Skin is warm and dry.     Findings: No lesion or rash.  Neurological:     Mental Status: She is alert and oriented to person, place, and time.  Psychiatric:        Behavior: Behavior normal.          Assessment & Plan:  Nausea- ongoing issue for pt.  She has stopped her PPI again.  Reiterated that excess stomach acid can cause nausea and abd pain if it progresses to gastritis.  Will refill Zofran.  Restart Protonix.  Add Carafate in case of gastritis.  Encouraged her to eat regularly to avoid an empty stomach.  If no improvement will need to  f/u w/ GI.  Upper abd pain- new.  Restart PPI.  Add Carafate.  Start Bentyl in case of IBS component.  Again, if no improvement will need to f/u w/ GI.  Itching- new.  Unclear if this is paraneoplastic or a side effect of treatment.  Reviewed recent labs- no evidence of liver or renal cause.  Start Hydroxyzine to help w/ both itching and anxiety.  Pt expressed understanding and is in agreement w/ plan.

## 2021-05-04 NOTE — Telephone Encounter (Signed)
Patient was seen today for these issues

## 2021-05-04 NOTE — Patient Instructions (Addendum)
Follow up in 4-6 weeks to recheck mood RESTART the Pantoprazole twice daily to decrease acid production and help w/ pain and nausea ADD the Sucralfate prior to each meal and before bed to protect the lining of your throat and stomach.  This will allow the pain and inflammation to heal USE the Dicyclomine as needed for abdominal pain/spasm USE the Ondansetron (Zofran) as needed for nausea TAKE the Hydroxyzine as needed for itching.  This also helps anxiety. Try and eat regularly to avoid an empty stomach.  Empty stomachs can be more painful. Call with any questions or concerns Hang in there!!

## 2021-05-07 LAB — MULTIPLE MYELOMA PANEL, SERUM
Albumin SerPl Elph-Mcnc: 3.5 g/dL (ref 2.9–4.4)
Albumin/Glob SerPl: 1.3 (ref 0.7–1.7)
Alpha 1: 0.2 g/dL (ref 0.0–0.4)
Alpha2 Glob SerPl Elph-Mcnc: 1.3 g/dL — ABNORMAL HIGH (ref 0.4–1.0)
B-Globulin SerPl Elph-Mcnc: 0.9 g/dL (ref 0.7–1.3)
Gamma Glob SerPl Elph-Mcnc: 0.4 g/dL (ref 0.4–1.8)
Globulin, Total: 2.8 g/dL (ref 2.2–3.9)
IgA: 57 mg/dL — ABNORMAL LOW (ref 64–422)
IgG (Immunoglobin G), Serum: 426 mg/dL — ABNORMAL LOW (ref 586–1602)
IgM (Immunoglobulin M), Srm: 41 mg/dL (ref 26–217)
Total Protein ELP: 6.3 g/dL (ref 6.0–8.5)

## 2021-05-10 ENCOUNTER — Telehealth: Payer: Self-pay | Admitting: Hematology

## 2021-05-10 ENCOUNTER — Telehealth: Payer: Self-pay | Admitting: Gastroenterology

## 2021-05-10 ENCOUNTER — Telehealth: Payer: Self-pay

## 2021-05-10 ENCOUNTER — Encounter: Payer: Self-pay | Admitting: Hematology

## 2021-05-10 NOTE — Progress Notes (Signed)
[facility] Follow up:    Tracy Minium, MD [address] [place] [address]   DIAGNOSIS: Cancer Staging History of right breast cancer Staging form: Breast, AJCC 7th Edition - Clinical stage from 06/13/2015: Stage IA (T1b, N0, M0) - Unsigned Laterality: Right - Pathologic stage from 07/10/2015: Stage IA (T1c, N0, cM0) - Unsigned Staged by: Pathologist Laterality: Right Estrogen receptor status: Positive Progesterone receptor status: Positive HER2 status: Negative Stage used in treatment planning: Yes National guidelines used in treatment planning: Yes Type of national guideline used in treatment planning: NCCN Staging comments: Staged on surgical specimen by Dr. Lyndon [name]   SUMMARY OF ONCOLOGIC HISTORY: Oncology History  History of right breast cancer  04/16/2000 Surgery   Left breast: Triple negative  invasive ductal cancer treated with lumpectomy, adjuvant chemotherapy, radiation , in [place], unknown stage   06/07/2015 Mammogram   Right breast mass 6x 6 x 5 mm, right axillary lymph node with slight cortex thickening measured 5 mm    06/13/2015 Initial Diagnosis   Right breast needle biopsy: Invasive ductal carcinoma, grade 1, right axillary lymph node biopsy negative , ER 95%, PR 5%, Ki-67 10%, HER-2 negative   06/13/2015 Clinical Stage   Stage IA: T1b N0   07/07/2015 Surgery   Right lumpectomy: Invasive ductal carcinoma grade 1, 1 cm span, with low-grade DCIS, DCIS focally 0.1 cm to inferior margin, 0/3 lymph nodes negative, ER 95%, PR 5%, HER-2 negative ratio 1.1, Ki-67 10%   07/07/2015 Pathologic Stage   Stage IA: T1c N0   07/13/2015 Procedure   Breast High/Moderate Risk Panel reveals no clinically significant variant at ATM, BRCA1, BRCA2, CDH1, CHEK2, PALB2, PTEN, and TP53.     08/23/2015 - 09/21/2015 Radiation Therapy   Adjuvant Radiation: Right breast/ 42.5Gy at 2.5 Gy per fraction x 17 fractions.   Right breast boost/ 7.5 Gy at  2.5 Gy per fraction x 3 fractions    Anti-estrogen oral therapy   Patient refused antiestrogen therapy   10/20/2015 Survivorship   Survivorship care plan completed and mailed to patient in lieu of in person visit at her request   Iron deficiency anemia  Multiple myeloma ([place])  01/25/2020 Initial Diagnosis   Multiple myeloma ([place])   02/11/2020 - 05/19/2020 Adjuvant Chemotherapy   Daratumumab Weekly    02/29/2020 - 03/13/2020 Radiation Therapy   The targets were treated to a total dose of 20 Gy in 10 fractions of 2 Gy each to the left hip and L5 using one plan.   06/02/2020 - 10/05/2020 Adjuvant Chemotherapy   Daratumumab and Carfilzomib   10/19/2020 -  Adjuvant Chemotherapy   Maintenance Daratumumab--initially every 2 weeks, then every 4 weeks.    Bone metastases ([place])  02/08/2021 Initial Diagnosis   Bone metastases ([facility])     CURRENT THERAPY: Daratumumab/B12 injection, Pamidronate  INTERVAL HISTORY:  Tracy [name] 75 y.o. female returns for evaluation of her multiple myeloma prior to receiving her every 4 week Daratumumab.  She was last seen in our clinic on 04/04/2021.  Patient notes some issues with nausea and vomiting.  No overt GI bleeding.  Following with her primary care physician Dr. Birdie [name] and her gastroenterologist to evaluate this further.  No acute abdominal pain at this time.  He is having bowel movements.  Labs done today 05/03/2021 show stable anemia hemoglobin of 10 WBC count of 4.5k with MCV of 95. CMP shows creatinine of 1.69. Myeloma panel shows no detectable M spike.  No other acute new symptoms.  Patient Active Problem List   Diagnosis Date Noted   Bone metastases ([place]) 02/08/2021   Angiodysplasia of intestine 08/23/2020   Port-A-Cath in place 08/04/2020   Counseling regarding advance care planning and goals of care 02/07/2020   Multiple myeloma ([place]) 01/25/2020   Dizziness 10/12/2019   Post-nasal drainage 10/12/2019   Allergic rhinitis 08/19/2018   Type  2 diabetes mellitus with diabetic neuropathy, unspecified ([place]) 11/05/2017   Encounter for long-term use of muscle relaxants 09/24/2017   CAD in native artery 09/24/2017   OSA (obstructive sleep apnea) 09/24/2017   [place] 09/24/2017   Asthenia 06/30/2017   Morbid obesity ([place]) 06/02/2017   Depression 06/02/2017   Iron deficiency anemia 09/23/2016   Anemia of chronic disease 09/28/2015   Genetic testing 08/21/2015   History of left breast cancer 07/13/2015   History of right breast cancer 06/20/2015   Hearing loss due to cerumen impaction 12/29/2014   Allergy to adhesive tape 05/19/2014   Hyperlipidemia 12/06/2013   GERD (gastroesophageal reflux disease) 12/06/2013   Cervical disc disease 11/11/2013   Osteopenia 05/25/2013   Allergic asthma 12/24/2012   Insomnia 04/02/2012   Physical exam, annual 04/02/2012   HTN (hypertension) 02/03/2012   Vertigo, benign positional 02/03/2012   Left groin pain 02/03/2012   Hip pain 10/10/2011   TIA (transient ischemic attack) 07/24/2011   Allergic reaction 07/05/2011   Osteoarthrosis involving lower leg 10/19/2010   Disorder of bone and cartilage 05/01/2010   Generalized anxiety disorder 05/01/2010   Vitamin D deficiency 02/20/2010   Unspecified chronic bronchitis ([place]) 08/24/2009   Other lymphedema 10/29/2007    is allergic to bacitracin-neomycin-polymyxin  [neomycin-bacitracin zn-polymyx], nsaids, tape, ambien [zolpidem tartrate], amoxicillin, clavulanic acid, contrast media [iodinated diagnostic agents], latex, and prednisone.  MEDICAL HISTORY: Past Medical History:  Diagnosis Date   Angiodysplasia of intestine 08/23/2020   Anxiety    Arthritis    Breast cancer ([place]) 06/13/15   Cancer ([place]) 2000   breast cancer   Chronic bronchitis ([place])    Chronic bronchitis ([place])    Hyperlipidemia    Hypertension    Myocardial infarction [facility]) 2001   Personal history of radiation therapy    Restless leg    Stroke ([place]) 2004   TIA, no  deficits    SURGICAL HISTORY: Past Surgical History:  Procedure Laterality Date   ABDOMINAL HYSTERECTOMY  1985   BREAST LUMPECTOMY Left 2000   radiation and chemo   BREAST LUMPECTOMY Right 2016   radiation   BREAST SURGERY  2001   lt breast lumpectomy   COLONOSCOPY WITH ESOPHAGOGASTRODUODENOSCOPY (EGD)  04/2020   ENTEROSCOPY N/A 03/15/2021   Procedure: ENTEROSCOPY;  Surgeon: Milus Banister, MD;  Location: [facility];  Service: Endoscopy;  Laterality: N/A;   GIVENS CAPSULE STUDY  07/2020   HOT HEMOSTASIS N/A 03/15/2021   Procedure: HOT HEMOSTASIS (ARGON PLASMA COAGULATION/BICAP);  Surgeon: Milus Banister, MD;  Location: [facility];  Service: Endoscopy;  Laterality: N/A;   IR IMAGING GUIDED PORT INSERTION  04/25/2020   RADIOACTIVE SEED GUIDED PARTIAL MASTECTOMY WITH AXILLARY SENTINEL LYMPH NODE BIOPSY Right 07/07/2015   Procedure: RIGHT RADIOACTIVE SEED GUIDED PARTIAL MASTECTOMY WITH AXILLARY SENTINEL LYMPH NODE BIOPSY;  Surgeon: Autumn Messing III, MD;  Location: [facility];  Service: General;  Laterality: Right;   SMALL INTESTINE SURGERY     TUBAL LIGATION      SOCIAL HISTORY: Social History   Socioeconomic History   Marital status: Divorced  Spouse name: Not on file   Number of children: 7   Years of education: Not on file   Highest education level: Not on file  Occupational History   Occupation: retired  Tobacco Use   Smoking status: Former    Packs/day: 1.00    Years: 20.00    Pack years: 20.00    Types: Cigarettes    Quit date: 07/30/2011    Years since quitting: 9.7   Smokeless tobacco: Never   Tobacco comments:    Quit >4 years ago; 1 ppd for about 5/20 years (remaining was less)  Vaping Use   Vaping Use: Former  Substance and Sexual Activity   Alcohol use: No    Alcohol/week: 0.0 standard drinks   Drug use: No   Sexual activity: Not Currently  Other Topics Concern   Not on file  Social History Narrative   Lives alone.  Retired.   Education:  11th grade GED.  Children:  7 (one here).    Social Determinants of Health   Financial Resource Strain: Low Risk    Difficulty of Paying Living Expenses: Not hard at all  Food Insecurity: No Food Insecurity   Worried About Charity fundraiser in the Last Year: Never true   Arboriculturist in the Last Year: Never true  Transportation Needs: Not on file  Physical Activity: Inactive   Days of Exercise per Week: 0 days   Minutes of Exercise per Session: 0 min  Stress: No Stress Concern Present   Feeling of Stress : Not at all  Social Connections: Moderately Isolated   Frequency of Communication with Friends and Family: More than three times a week   Frequency of Social Gatherings with Friends and Family: Once a week   Attends Religious Services: 1 to 4 times per year   Active Member of [employer] or Organizations: No   Attends Music therapist: Never   Marital Status: Divorced  Human resources officer Violence: Not At Risk   Fear of Current or Ex-Partner: No   Emotionally Abused: No   Physically Abused: No   Sexually Abused: No    FAMILY HISTORY: Family History  Problem Relation Age of Onset   Emphysema Mother 60       smoker   Diabetes Father    Lung cancer Sister        dx. <50; former smoker   Diabetes Brother    Diabetes Brother    Brain cancer Brother 42       unknown tumor type   Stroke Maternal Grandmother    Diabetes Paternal Grandmother    Cancer Daughter 66       neck cancer   Other Daughter        hysterectomy for unspecified reason   Colon cancer Daughter    Diabetes Paternal Aunt    Breast cancer Cousin    Cancer Cousin        unspecified type   Breast cancer Other        triple negative breast cancer in her 48s   Colon polyps Neg Hx    Esophageal cancer Neg Hx    Gallbladder disease Neg Hx    .10 Point review of Systems was done is negative except as noted above.  PHYSICAL EXAMINATION  ECOG PERFORMANCE STATUS: 1 - Symptomatic but  completely ambulatory  Vitals:   05/03/21 1108  BP: (!) 136/50  Pulse: 77  Resp: 18  Temp: 97.8 F (36.6 C)  SpO2: 100%  . Marland Kitchen GENERAL:alert, in no acute distress and comfortable SKIN: no acute rashes, no significant lesions EYES: conjunctiva are pink and non-injected, sclera anicteric OROPHARYNX: MMM, no exudates, no oropharyngeal erythema or ulceration NECK: supple, no JVD LYMPH:  no palpable lymphadenopathy in the cervical, axillary or inguinal regions LUNGS: clear to auscultation b/l with normal respiratory effort HEART: regular rate & rhythm ABDOMEN:  normoactive bowel sounds , non tender, not distended. Extremity: no pedal edema PSYCH: alert & oriented x 3 with fluent speech NEURO: no focal motor/sensory deficits   LABORATORY DATA: . CBC Latest Ref Rng & Units 05/03/2021 04/30/2021 04/04/2021  WBC 4.0 - 10.5 K/uL 4.5 5.4 5.2  Hemoglobin 12.0 - 15.0 g/dL 10.0(L) 11.4(L) 8.8(L)  Hematocrit 36.0 - 46.0 % 30.4(L) 34.5(L) 26.6(L)  Platelets 150 - 400 K/uL 288 350 274   . CMP Latest Ref Rng & Units 05/03/2021 04/30/2021 04/10/2021  Glucose 70 - 99 mg/dL 121(H) 120(H) 118(H)  BUN 8 - 23 mg/dL '13 15 18  ' Creatinine 0.44 - 1.00 mg/dL 1.69(H) 1.65(H) 1.51(H)  Sodium 135 - 145 mmol/L 141 139 142  Potassium 3.5 - 5.1 mmol/L 4.6 4.7 4.4  Chloride 98 - 111 mmol/L 112(H) 107 105  CO2 22 - 32 mmol/L 19(L) 20(L) 24  Calcium 8.9 - 10.3 mg/dL 9.0 9.1 9.5  Total Protein 6.5 - 8.1 g/dL 6.7 7.7 6.8  Total Bilirubin 0.3 - 1.2 mg/dL 0.3 0.3 0.4  Alkaline Phos 38 - 126 U/L 75 74 72  AST 15 - 41 U/L '21 16 14  ' ALT 0 - 44 U/L '13 13 15     ' ASSESSMENT and THERAPY PLAN:   75 yo with    1)  IgG Kappa Multiple myeloma with bone lesions, anemia, renal insuff. M spike @ 3.7g/dl on diagnosis. 1p deletion, polymorphic variant, 13q deletion 2) h/o Dm2 3) Diabetic Neuropathy 4) CKD - likely from DM2, but could have an element of myeloma kidney. 5) h/o TIA and AMI 6) Iron deficiency      PLAN: -Discussed pt labwork today, 05/03/2021  Labs done today 05/03/2021 show stable anemia hemoglobin of 10 WBC count of 4.5k with MCV of 95. CMP shows creatinine of 1.69. Myeloma panel shows no detectable M spike.  -Patient recommended drinking at least 64 ounces of water daily and addressing her antihypertensive dosing with her primary care physician to avoid hypotension. -No primary toxicities from continued maintenance daratumumab every 4 weeks at this time. -Continue [place] Vitamin B12 q4weeks. -Continue Aredia every 8 weeks. -Continue 50000 IU Vitamin D weekly. -Continue Vitamin B-Complex daily.  Follow-up Please schedule next 3 cycles of monthly daratumumab with port flush and labs and MD visits Continue Aredia 8 weeks   . The total time spent in the appointment was 30 minutes and more than 50% was on counseling and direct patient cares.   [name] MD MS Hematology/Oncology Physician [facility]

## 2021-05-10 NOTE — Telephone Encounter (Signed)
I called patient and notified her of Dr. Virgil Benedict recommendation. She verbalized understanding and agreement.

## 2021-05-10 NOTE — Telephone Encounter (Signed)
The pt appt with Dr Ardis Hughs was cancelled and appt made to see Amy on 10/18 for change in BM, mucous in stools.  The pt has been advised.

## 2021-05-10 NOTE — Telephone Encounter (Signed)
Patient has been seeing PCP for stomach issues (nausea, dizziness, and now has mucus in bowel movements). PCP said it is beyond her scope of expertise and wants her to see Dr. Ardis Hughs.  His first appt is 11/30, which pt was scheduled for.  Wants to know if there is anything else she can do in the interim.  Has been having these symptoms about a month.  Please call and advise.  Thank you.

## 2021-05-10 NOTE — Telephone Encounter (Signed)
Patient states that she has been constipated and decided to take a laxative yesterday. She has been experiencing nausea and mucus in her stool. States she was previously told that she did not need to keep coming in the office for reoccurring issues per Dr. Birdie Riddle, just to call in with health updates. Wanting to know what she needs to do next. Please advise

## 2021-05-10 NOTE — Telephone Encounter (Signed)
The next step is to call GI.  I was not aware of mucous in her stool.  She has medication available (ondansetron/zofran) as needed for nausea

## 2021-05-10 NOTE — Telephone Encounter (Signed)
Scheduled follow-up appointment per 10/6 los. Patient is aware. 

## 2021-05-15 ENCOUNTER — Ambulatory Visit (INDEPENDENT_AMBULATORY_CARE_PROVIDER_SITE_OTHER): Payer: Medicare Other | Admitting: Physician Assistant

## 2021-05-15 ENCOUNTER — Encounter: Payer: Self-pay | Admitting: Physician Assistant

## 2021-05-15 VITALS — BP 128/70 | HR 80 | Ht 60.0 in | Wt 178.5 lb

## 2021-05-15 DIAGNOSIS — R634 Abnormal weight loss: Secondary | ICD-10-CM

## 2021-05-15 DIAGNOSIS — R1013 Epigastric pain: Secondary | ICD-10-CM

## 2021-05-15 DIAGNOSIS — R110 Nausea: Secondary | ICD-10-CM

## 2021-05-15 DIAGNOSIS — R531 Weakness: Secondary | ICD-10-CM

## 2021-05-15 MED ORDER — PROMETHAZINE HCL 25 MG PO TABS: ORAL_TABLET | ORAL | 3 refills | Status: DC

## 2021-05-15 NOTE — Patient Instructions (Signed)
If you are age 77 or older, your body mass index should be between 23-30. Your Body mass index is 34.86 kg/m. If this is out of the aforementioned range listed, please consider follow up with your Primary Care Provider. __________________________________________________________  The [facility] providers would like to encourage you to use [facility] to communicate with providers for non-urgent requests or questions.  Due to long hold times on the telephone, sending your provider a message by [facility] may be a faster and more efficient way to get a response.  Please allow 48 business hours for a response.  Please remember that this is for non-urgent requests.   Continue Pantoprazole 40 mg 1 tablet twice daily  START Promethazine 25 mg take 1/2 tablet in the morning then every 6 hours as needed for nausea.  Follow up pending the results of your Endoscopy or as needed.  Thank you for entrusting me with your care and choosing [facility].  Amy Esterwood, PA-C

## 2021-05-15 NOTE — Progress Notes (Signed)
Subjective:    Patient ID: Tracy [name], female    DOB: October 02, 1945, 75 y.o.   MRN: [id number]  HPI Tracy [name] is a pleasant 75 year old African-American female established with Dr. Ardis Hughs, who is referred back today per her PCP Dr. Birdie Riddle for 4 to 6-week history of persistent and fairly constant nausea associated with weakness, and weight loss. Patient has multiple comorbidities, she was diagnosed with breast cancer in 2016, and diagnosed with multiple myeloma in June 2021 with associated bone lesions.  She has been undergoing chemotherapy and completed a course of radiation.  She is currently on Davatunumab.  She also has a anemia of chronic disease, adult onset diabetes mellitus, osteoarthritis, sleep apnea, hypertension, coronary artery disease, history of prior TIA and history of intestinal AVMs. She says she started having nausea 4 to 6 weeks ago which at this point is constant with periods of worsening throughout the day, this is been associated with significant weakness and some spells of weakness.  She has had some chills over the past month but no documented fever.  She has also developed intense pruritus and has some scattered tiny skin lesions that are papular and intensely pruritic.  The pruritus started around the same time.  She feels that she is lost about 12 pounds over the past 6 weeks from 189 down to 178 Generally not vomiting no diarrhea melena or hematochezia.  She has been tried on multiple different medications and does not think that anything thus far has been helpful.  She is on the Protonix twice a day, she had been given a prescription for Zofran which she said made her feel worse, Compazine was not helpful, and has discontinued dicyclomine as this also made her feel worse.. She had CT of the abdomen pelvis done on 04/30/2021 showing a normal-appearing gallbladder and liver, study was otherwise unremarkable with the exception of the stable lytic lesions at L5 and in the left  acetabulum. Most recent hemoglobin 10 hematocrit 30.4 early October She reports having upper abdominal ultrasound done through urgent care in October as well which was unremarkable.  Patient does mention that her port site has been tender and she wonders if she may have an underlying infection  Most recent GI evaluation with EGD in October 2021 showed mild gastritis and was otherwise negative Colonoscopy October 2021 with a few diverticuli and internal and external hemorrhoids.  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  Review of Systems Pertinent positive and negative review of systems were noted in the above HPI section.  All other review of systems was otherwise negative.   Outpatient Encounter Medications as of 05/15/2021  Medication Sig   Accu-Chek Softclix Lancets lancets Use as instructed to check sugars 1-2 times daily.   acyclovir (ZOVIRAX) 400 MG tablet TAKE 1 TABLET BY MOUTH  TWICE DAILY   albuterol (VENTOLIN HFA) 108 (90 Base) MCG/ACT inhaler Inhale 2 puffs into the lungs every 6 (six) hours as needed for wheezing or shortness of breath.   atorvastatin (LIPITOR) 10 MG tablet TAKE 1 TABLET BY MOUTH  DAILY   B Complex-C (B-COMPLEX WITH VITAMIN C) tablet Take 1 tablet by mouth in the morning.   dexamethasone (DECADRON) 4 MG tablet Take 3 tabs with breakfast the day after each treatment.   dicyclomine (BENTYL) 10 MG capsule Take 1 capsule (10 mg total) by mouth 3 (three) times daily as needed for spasms.   glucose blood (ACCU-CHEK GUIDE) test  strip Use as instructed to check sugars 1-2 times daily.   hydrOXYzine (VISTARIL) 25 MG capsule Take 1 capsule (25 mg total) by mouth every 8 (eight) hours as needed.   loratadine (CLARITIN) 10 MG tablet Take 10 mg by mouth daily.   meclizine (ANTIVERT) 25 MG tablet Take 1 tablet (25 mg total) by mouth 3 (three) times daily as needed for dizziness.   metFORMIN (GLUCOPHAGE) 500 MG tablet TAKE  1 TABLET(500 MG) BY MOUTH TWICE DAILY WITH A MEAL   Multiple Vitamin (MULTIVITAMIN WITH MINERALS) TABS tablet Take 1 tablet by mouth daily. Centrum for Women 50+   pantoprazole (PROTONIX) 40 MG tablet Take 1 tablet (40 mg total) by mouth 2 (two) times daily.   potassium chloride SA (KLOR-CON) 20 MEQ tablet TAKE 1 TABLET BY MOUTH  TWICE DAILY   pregabalin (LYRICA) 50 MG capsule TAKE 1 CAPSULE(50 MG) BY MOUTH THREE TIMES DAILY   promethazine (PHENERGAN) 25 MG tablet Take 1/2 tablet in the morning then every 6 hours as needed for nausea   tiZANidine (ZANAFLEX) 4 MG tablet TAKE 1 TABLET(4 MG) BY MOUTH AT BEDTIME   traZODone (DESYREL) 100 MG tablet TAKE 1 TABLET BY MOUTH AT  BEDTIME   triamcinolone cream (KENALOG) 0.1 % Apply 1 application topically 2 (two) times daily.   Vitamin D, Ergocalciferol, (DRISDOL) 1.25 MG (50000 UNIT) CAPS capsule TAKE 1 CAPSULE BY MOUTH 1 TIME A WEEK (Patient taking differently: Take 50,000 Units by mouth every Sunday.)   [DISCONTINUED] ondansetron (ZOFRAN) 8 MG tablet Take 1 tablet (8 mg total) by mouth 2 (two) times daily as needed (Nausea or vomiting).   [DISCONTINUED] prochlorperazine (COMPAZINE) 10 MG tablet Take 1 tablet (10 mg total) by mouth every 6 (six) hours as needed (Nausea or vomiting).   [DISCONTINUED] sucralfate (CARAFATE) 1 g tablet Take 1 tablet (1 g total) by mouth 4 (four) times daily -  with meals and at bedtime.   lidocaine-prilocaine (EMLA) cream APPLY 1 APPLICATION TOPICALLY AS NEEDED. (Patient not taking: Reported on 05/15/2021)   mometasone (NASONEX) 50 MCG/ACT nasal spray Place 2 sprays into the nose daily. (Patient taking differently: Place 2 sprays into the nose daily as needed (allergies.).)   No facility-administered encounter medications on file as of 05/15/2021.   Allergies  Allergen Reactions   Bacitracin-Neomycin-Polymyxin  [Neomycin-Bacitracin Zn-Polymyx] Swelling   Nsaids Other (See Comments)    nosebleeds   Tape Hives    Pt cannot  tolerate bandaids, tape, or any other adhesives.    Ambien [Zolpidem Tartrate]     Hives    Amoxicillin     Rash    Clavulanic Acid Hives   Contrast Media [Iodinated Diagnostic Agents] Hives    Pt states hives with prior ct, was given benadryl to resolve   Latex Swelling   Prednisone Swelling    Pt tolerates Dexamethasone. Throat swelling   Patient Active Problem List   Diagnosis Date Noted   Bone metastases ([place]) 02/08/2021   Angiodysplasia of intestine 08/23/2020   Port-A-Cath in place 08/04/2020   Counseling regarding advance care planning and goals of care 02/07/2020   Multiple myeloma ([place]) 01/25/2020   Dizziness 10/12/2019   Post-nasal drainage 10/12/2019   Allergic rhinitis 08/19/2018   Type 2 diabetes mellitus with diabetic neuropathy, unspecified ([place]) 11/05/2017   Encounter for long-term use of muscle relaxants 09/24/2017   CAD in native artery 09/24/2017   OSA (obstructive sleep apnea) 09/24/2017   Snorings 09/24/2017   Asthenia 06/30/2017   Morbid obesity (  [place]) 06/02/2017   Depression 06/02/2017   Iron deficiency anemia 09/23/2016   Anemia of chronic disease 09/28/2015   Genetic testing 08/21/2015   History of left breast cancer 07/13/2015   History of right breast cancer 06/20/2015   Hearing loss due to cerumen impaction 12/29/2014   Allergy to adhesive tape 05/19/2014   Hyperlipidemia 12/06/2013   GERD (gastroesophageal reflux disease) 12/06/2013   Cervical disc disease 11/11/2013   Osteopenia 05/25/2013   Allergic asthma 12/24/2012   Insomnia 04/02/2012   Physical exam, annual 04/02/2012   HTN (hypertension) 02/03/2012   Vertigo, benign positional 02/03/2012   Left groin pain 02/03/2012   Hip pain 10/10/2011   TIA (transient ischemic attack) 07/24/2011   Allergic reaction 07/05/2011   Osteoarthrosis involving lower leg 10/19/2010   Disorder of bone and cartilage 05/01/2010   Generalized anxiety disorder 05/01/2010   Vitamin D deficiency  02/20/2010   Unspecified chronic bronchitis ([place]) 08/24/2009   Other lymphedema 10/29/2007   Social History   Socioeconomic History   Marital status: Divorced    Spouse name: Not on file   Number of children: 7   Years of education: Not on file   Highest education level: Not on file  Occupational History   Occupation: retired  Tobacco Use   Smoking status: Former    Packs/day: 1.00    Years: 20.00    Pack years: 20.00    Types: Cigarettes    Quit date: 07/30/2011    Years since quitting: 9.8   Smokeless tobacco: Never   Tobacco comments:    Quit >4 years ago; 1 ppd for about 5/20 years (remaining was less)  Vaping Use   Vaping Use: Former  Substance and Sexual Activity   Alcohol use: No    Alcohol/week: 0.0 standard drinks   Drug use: No   Sexual activity: Not Currently  Other Topics Concern   Not on file  Social History Narrative   Lives alone.  Retired.  Education:  11th grade GED.  Children:  7 (one here).    Social Determinants of Health   Financial Resource Strain: Low Risk    Difficulty of Paying Living Expenses: Not hard at all  Food Insecurity: Not on file  Transportation Needs: Not on file  Physical Activity: Inactive   Days of Exercise per Week: 0 days   Minutes of Exercise per Session: 0 min  Stress: No Stress Concern Present   Feeling of Stress : Not at all  Social Connections: Moderately Isolated   Frequency of Communication with Friends and Family: More than three times a week   Frequency of Social Gatherings with Friends and Family: Once a week   Attends Religious Services: 1 to 4 times per year   Active Member of [employer] or Organizations: No   Attends Archivist Meetings: Never   Marital Status: Divorced  Human resources officer Violence: Not At Risk   Fear of Current or Ex-Partner: No   Emotionally Abused: No   Physically Abused: No   Sexually Abused: No    Tracy [name]'s family history includes Brain cancer (age of onset: 56) in her brother;  Breast cancer in her cousin and another family member; Cancer in her cousin; Cancer (age of onset: 67) in her daughter; Colon cancer in her daughter; Diabetes in her brother, brother, father, paternal aunt, and paternal grandmother; Emphysema (age of onset: 84) in her mother; Lung cancer in her sister; Other in her daughter; Stroke in her maternal grandmother.  Objective:    Vitals:   05/15/21 1004  BP: 128/70  Pulse: 80    Physical Exam Well-developed well-nourished elderly African-American female in no acute distress.  Height, Weight, 178 BMI 34.8  HEENT; nontraumatic normocephalic, EOMI, PE R LA, sclera anicteric. Oropharynx; not examined today Neck; supple, no JVD Cardiovascular; regular rate and rhythm with S1-S2, no murmur rub or gallop Pulmonary; Clear bilaterally, port in the upper chest wall Abdomen; soft, no focal tenderness, nondistended, no palpable mass or hepatosplenomegaly, bowel sounds are active Rectal; not done today Skin; benign exam, patient has some scattered tiny papular lesions on arms and trunk which are intensely pruritic Extremities; no clubbing cyanosis or edema skin warm and dry Neuro/Psych; alert and oriented x4, grossly nonfocal mood and affect appropriate        Assessment & Plan:   #33 75 year old African-American female with multiple myeloma diagnosed June 2021, she completed radiation and is currently continuing chemotherapy with Duratumumab  Bone metastases diagnosed July 2022  #2  4 to 6-week history of persistent constant nausea associated with episodes of significant weakness, and weight loss of 12 pounds. Patient also has developed what she describes as a rash with very tiny papular skin lesions which are intensely pruritic  Etiology of nausea weakness and weight loss is not clear.  I am not certain that this is secondary to a separate underlying GI pathology as she has had recent unrevealing CT scan.  She did have a EGD about a year ago  that showed mild gastritis.  We can rule out significant gastropathy, ulcer disease. I suspect symptoms are secondary to a more systemic process. Rule out occult infection, port infection, chemotherapy induced effects  #3 history of intestinal AVMs status post APC of jejunal AVM October 2021 #4 adult onset diabetes mellitus 5.  Breast cancer diagnosed 2016-completed treatment #6 prior history of TIA 7.  Sleep apnea 8.  Coronary artery disease 9.  Hypertension  Plan; Continue Protonix 40 mg p.o. twice daily AC breakfast and AC dinner We will start a trial of Phenergan 12.5 mg to be taken on a scheduled basis, she will take initial dose early in the morning prior to eating and then every 6 hours. Patient will be scheduled for upper endoscopy with Dr. Ardis Hughs.  Procedure was discussed in detail with the patient including indications risk benefits and she is agreeable to proceed.  I have asked her to contact oncology today regarding concerns about tenderness at her port site and possible underlying port infection.  Rule out systemic infection, may need blood cultures.   Tracy [name] Genia Harold PA-C 05/15/2021   Cc: [name], MD

## 2021-05-16 NOTE — Progress Notes (Signed)
I agree with the above note, plan 

## 2021-05-17 ENCOUNTER — Other Ambulatory Visit: Payer: Self-pay | Admitting: Hematology

## 2021-05-18 ENCOUNTER — Other Ambulatory Visit: Payer: Self-pay | Admitting: Family Medicine

## 2021-05-20 NOTE — Telephone Encounter (Signed)
Apologies for late response.  Should have appt with PCP for ongoing symptoms  Thank you  Denice Paradise

## 2021-05-21 ENCOUNTER — Encounter: Payer: Self-pay | Admitting: Gastroenterology

## 2021-05-21 ENCOUNTER — Ambulatory Visit (AMBULATORY_SURGERY_CENTER): Payer: Medicare Other | Admitting: Gastroenterology

## 2021-05-21 ENCOUNTER — Other Ambulatory Visit: Payer: Self-pay

## 2021-05-21 VITALS — BP 146/71 | HR 80 | Temp 95.9°F | Resp 26 | Ht 60.0 in | Wt 178.0 lb

## 2021-05-21 DIAGNOSIS — R531 Weakness: Secondary | ICD-10-CM | POA: Diagnosis not present

## 2021-05-21 DIAGNOSIS — R634 Abnormal weight loss: Secondary | ICD-10-CM

## 2021-05-21 DIAGNOSIS — K2970 Gastritis, unspecified, without bleeding: Secondary | ICD-10-CM | POA: Diagnosis not present

## 2021-05-21 DIAGNOSIS — R110 Nausea: Secondary | ICD-10-CM

## 2021-05-21 DIAGNOSIS — K3189 Other diseases of stomach and duodenum: Secondary | ICD-10-CM | POA: Diagnosis not present

## 2021-05-21 DIAGNOSIS — R1013 Epigastric pain: Secondary | ICD-10-CM

## 2021-05-21 MED ORDER — SODIUM CHLORIDE 0.9 % IV SOLN: 500.0000 mL | Freq: Once | INTRAVENOUS | Status: DC

## 2021-05-21 NOTE — Patient Instructions (Signed)
Resume previous diet and continue current medications. Awaiting pathology results.  YOU HAD AN ENDOSCOPIC PROCEDURE TODAY AT [facility]:   Refer to the procedure report that was given to you for any specific questions about what was found during the examination.  If the procedure report does not answer your questions, please call your gastroenterologist to clarify.  If you requested that your care partner not be given the details of your procedure findings, then the procedure report has been included in a sealed envelope for you to review at your convenience later.  YOU SHOULD EXPECT: Some feelings of bloating in the abdomen. Passage of more gas than usual.  Walking can help get rid of the air that was put into your GI tract during the procedure and reduce the bloating. If you had a lower endoscopy (such as a colonoscopy or flexible sigmoidoscopy) you may notice spotting of blood in your stool or on the toilet paper. If you underwent a bowel prep for your procedure, you may not have a normal bowel movement for a few days.  Please Note:  You might notice some irritation and congestion in your nose or some drainage.  This is from the oxygen used during your procedure.  There is no need for concern and it should clear up in a day or so.  SYMPTOMS TO REPORT IMMEDIATELY:   Following upper endoscopy (EGD)  Vomiting of blood or coffee ground material  New chest pain or pain under the shoulder blades  Painful or persistently difficult swallowing  New shortness of breath  Fever of 100F or higher  Black, tarry-looking stools  For urgent or emergent issues, a gastroenterologist can be reached at any hour by calling [phone]. Do not use MyChart messaging for urgent concerns.    DIET:  We do recommend a small meal at first, but then you may proceed to your regular diet.  Drink plenty of fluids but you should avoid alcoholic beverages for 24 hours.  ACTIVITY:  You should plan to  take it easy for the rest of today and you should NOT DRIVE or use heavy machinery until tomorrow (because of the sedation medicines used during the test).    FOLLOW UP: Our staff will call the number listed on your records 48-72 hours following your procedure to check on you and address any questions or concerns that you may have regarding the information given to you following your procedure. If we do not reach you, we will leave a message.  We will attempt to reach you two times.  During this call, we will ask if you have developed any symptoms of COVID 19. If you develop any symptoms (ie: fever, flu-like symptoms, shortness of breath, cough etc.) before then, please call [phone].  If you test positive for Covid 19 in the 2 weeks post procedure, please call and report this information to [place].    If any biopsies were taken you will be contacted by phone or by letter within the next 1-3 weeks.  Please call us at [phone] if you have not heard about the biopsies in 3 weeks.    SIGNATURES/CONFIDENTIALITY: You and/or your care partner have signed paperwork which will be entered into your electronic medical record.  These signatures attest to the fact that that the information above on your After Visit Summary has been reviewed and is understood.  Full responsibility of the confidentiality of this discharge information lies with you and/or your care-partner.

## 2021-05-21 NOTE — Progress Notes (Signed)
Pt's states no medical or surgical changes since previsit or office visit.  ° °VS DT °

## 2021-05-21 NOTE — Progress Notes (Signed)
Called to room to assist during endoscopic procedure.  Patient ID and intended procedure confirmed with present staff. Received instructions for my participation in the procedure from the performing physician.  

## 2021-05-21 NOTE — Progress Notes (Signed)
  The recent H&P (dated last week) was reviewed, the patient was examined and there is no change in the patients condition since that H&P was completed.   Milus Banister  05/21/2021, 9:11 AM

## 2021-05-21 NOTE — Progress Notes (Signed)
To pacu, VsS. Report to [email or web address]

## 2021-05-21 NOTE — Op Note (Signed)
[place] Patient Name: Tracy [name] Procedure Date: 05/21/2021 10:06 AM MRN: [id number] Endoscopist: Milus Banister , MD Age: 75 Referring MD:  Date of Birth: 12/26/1945 Gender: Female Account #: [id number] Procedure:                Upper GI endoscopy Indications:              Epigastric abdominal pain, Nausea Medicines:                Monitored Anesthesia Care Procedure:                Pre-Anesthesia Assessment:                           - Prior to the procedure, a History and Physical                            was performed, and patient medications and                            allergies were reviewed. The patient's tolerance of                            previous anesthesia was also reviewed. The risks                            and benefits of the procedure and the sedation                            options and risks were discussed with the patient.                            All questions were answered, and informed consent                            was obtained. Prior Anticoagulants: The patient has                            taken no previous anticoagulant or antiplatelet                            agents. ASA Grade Assessment: III - A patient with                            severe systemic disease. After reviewing the risks                            and benefits, the patient was deemed in                            satisfactory condition to undergo the procedure.                           After obtaining informed consent, the endoscope was  passed under direct vision. Throughout the                            procedure, the patient's blood pressure, pulse, and                            oxygen saturations were monitored continuously. The                            Endoscope was introduced through the mouth, and                            advanced to the second part of duodenum. The upper                            GI endoscopy was  accomplished without difficulty.                            The patient tolerated the procedure well. Scope In: Scope Out: Findings:                 Mild inflammation characterized by erythema,                            friability and granularity was found in the entire                            examined stomach. Biopsies were taken with a cold                            forceps for histology.                           The exam was otherwise without abnormality. Complications:            No immediate complications. Estimated blood loss:                            None. Estimated Blood Loss:     Estimated blood loss: none. Impression:               - Mild, non-specific gastritis. Biopsied to check                            for H. pylori.                           - The examination was otherwise normal. Recommendation:           - Patient has a contact number available for                            emergencies. The signs and symptoms of potential                            delayed complications were discussed with the  patient. Return to normal activities tomorrow.                            Written discharge instructions were provided to the                            patient.                           - Resume previous diet.                           - Continue present medications.                           - Await pathology results. Milus Banister, MD 05/21/2021 10:22:11 AM This report has been signed electronically.

## 2021-05-22 NOTE — Telephone Encounter (Signed)
Patient was seen by provider on 10/70/22.

## 2021-05-23 ENCOUNTER — Telehealth: Payer: Self-pay

## 2021-05-23 NOTE — Telephone Encounter (Signed)
  Follow up Call-  Call back number 05/21/2021 05/16/2020  Post procedure Call Back phone  # [phone]  Permission to leave phone message Yes Yes  Some recent data might be hidden     Patient questions:  Do you have a fever, pain , or abdominal swelling? No. Pain Score  0 *  Have you tolerated food without any problems? Yes.    Have you been able to return to your normal activities? Yes.    Do you have any questions about your discharge instructions: Diet   No. Medications  No. Follow up visit  No.  Do you have questions or concerns about your Care? No.  Actions: * If pain score is 4 or above: No action needed, pain <4.  Have you developed a fever since your procedure? no  2.   Have you had an respiratory symptoms (SOB or cough) since your procedure? no  3.   Have you tested positive for COVID 19 since your procedure no  4.   Have you had any family members/close contacts diagnosed with the COVID 19 since your procedure?  no   If yes to any of these questions please route to Joylene John, RN and Joella Prince, RN

## 2021-05-24 ENCOUNTER — Other Ambulatory Visit: Payer: Self-pay | Admitting: Hematology

## 2021-05-24 DIAGNOSIS — C9000 Multiple myeloma not having achieved remission: Secondary | ICD-10-CM

## 2021-05-25 ENCOUNTER — Encounter: Payer: Self-pay | Admitting: Gastroenterology

## 2021-05-26 NOTE — Assessment & Plan Note (Signed)
Ongoing issue.  She is not interested in medication at this time.  Feels that if she can get stomach issues and itching under control that mood will improve.  Will follow.

## 2021-05-26 NOTE — Assessment & Plan Note (Signed)
Ongoing issue.  Currently having treatment.  Unclear if her itching is a paraneoplastic symptom or a side effect from treatment.

## 2021-05-29 ENCOUNTER — Encounter: Payer: Self-pay | Admitting: Registered Nurse

## 2021-05-29 ENCOUNTER — Other Ambulatory Visit: Payer: Self-pay

## 2021-05-29 ENCOUNTER — Telehealth (INDEPENDENT_AMBULATORY_CARE_PROVIDER_SITE_OTHER): Payer: Medicare Other | Admitting: Registered Nurse

## 2021-05-29 DIAGNOSIS — B9689 Other specified bacterial agents as the cause of diseases classified elsewhere: Secondary | ICD-10-CM

## 2021-05-29 DIAGNOSIS — J019 Acute sinusitis, unspecified: Secondary | ICD-10-CM | POA: Diagnosis not present

## 2021-05-29 MED ORDER — AZITHROMYCIN 250 MG PO TABS: ORAL_TABLET | ORAL | 0 refills | Status: AC

## 2021-05-29 MED ORDER — AZELASTINE HCL 0.1 % NA SOLN: 1.0000 | Freq: Two times a day (BID) | NASAL | 12 refills | Status: DC

## 2021-05-29 NOTE — Progress Notes (Signed)
Telemedicine Encounter- SOAP NOTE Established Patient  This telephone encounter was conducted with the patient's (or proxy's) verbal consent via audio telecommunications: yes/no: Yes Patient was instructed to have this encounter in a suitably private space; and to only have persons present to whom they give permission to participate. In addition, patient identity was confirmed by use of name plus two identifiers (DOB and address).  I discussed the limitations, risks, security and privacy concerns of performing an evaluation and management service by telephone and the availability of in person appointments. I also discussed with the patient that there may be a patient responsible charge related to this service. The patient expressed understanding and agreed to proceed.  I spent a total of 16 minutes talking with the patient or their proxy.  Patient at home Provider in office  Participants: Jari Sportsman, NP and Lance Sell  Chief Complaint  Patient presents with   Nasal Congestion    Patient state she has been having some congestion and coughing up a lot of mucus for about a week. Patient states she has not took any meds    Subjective   Tracy [name] is a 75 y.o. established patient. Telephone visit today for nasal congestion  HPI Onset 7-8 days ago Nasal congestion, pnd, coughing up large clumps of mucus. Tries to blow nose, but no production. Other times has rhinorrhea. Headache across bridge of nose and between eyes. No shob, doe, chest pain, vomiting and diarrhea, fevers, chills, sweats, fatigue.  Mild lightheadedness, nausea when swallowing mucus.  Patient Active Problem List   Diagnosis Date Noted   Bone metastases (HCC) 02/08/2021   Angiodysplasia of intestine 08/23/2020   Port-A-Cath in place 08/04/2020   Counseling regarding advance care planning and goals of care 02/07/2020   Multiple myeloma (HCC) 01/25/2020   Dizziness 10/12/2019   Post-nasal drainage  10/12/2019   Allergic rhinitis 08/19/2018   Type 2 diabetes mellitus with diabetic neuropathy, unspecified (HCC) 11/05/2017   Encounter for long-term use of muscle relaxants 09/24/2017   CAD in native artery 09/24/2017   OSA (obstructive sleep apnea) 09/24/2017   Snorings 09/24/2017   Asthenia 06/30/2017   Morbid obesity (HCC) 06/02/2017   Depression 06/02/2017   Iron deficiency anemia 09/23/2016   Anemia of chronic disease 09/28/2015   Genetic testing 08/21/2015   History of left breast cancer 07/13/2015   History of right breast cancer 06/20/2015   Hearing loss due to cerumen impaction 12/29/2014   Allergy to adhesive tape 05/19/2014   Hyperlipidemia 12/06/2013   GERD (gastroesophageal reflux disease) 12/06/2013   Cervical disc disease 11/11/2013   Osteopenia 05/25/2013   Allergic asthma 12/24/2012   Insomnia 04/02/2012   Physical exam, annual 04/02/2012   HTN (hypertension) 02/03/2012   Vertigo, benign positional 02/03/2012   Left groin pain 02/03/2012   Hip pain 10/10/2011   TIA (transient ischemic attack) 07/24/2011   Allergic reaction 07/05/2011   Osteoarthrosis involving lower leg 10/19/2010   Disorder of bone and cartilage 05/01/2010   Generalized anxiety disorder 05/01/2010   Vitamin D deficiency 02/20/2010   Unspecified chronic bronchitis (HCC) 08/24/2009   Other lymphedema 10/29/2007    Past Medical History:  Diagnosis Date   Angiodysplasia of intestine 08/23/2020   Anxiety    Arthritis    Breast cancer (HCC) 06/13/15   Cancer (HCC) 2000   breast cancer   Chronic bronchitis (HCC)    Chronic bronchitis (HCC)    Hyperlipidemia    Hypertension    Myocardial  infarction [facility]) 2001   Personal history of radiation therapy    Restless leg    Stroke [facility]) 2004   TIA, no deficits    Current Outpatient Medications  Medication Sig Dispense Refill   Accu-Chek Softclix Lancets lancets Use as instructed to check sugars 1-2 times daily. (Patient not taking: No sig  reported) 100 each 12   acyclovir (ZOVIRAX) 400 MG tablet TAKE 1 TABLET BY MOUTH  TWICE DAILY 180 tablet 1   albuterol (VENTOLIN HFA) 108 (90 Base) MCG/ACT inhaler Inhale 2 puffs into the lungs every 6 (six) hours as needed for wheezing or shortness of breath. 8 g 2   atorvastatin (LIPITOR) 10 MG tablet TAKE 1 TABLET BY MOUTH  DAILY 90 tablet 1   B Complex-C (B-COMPLEX WITH VITAMIN C) tablet Take 1 tablet by mouth in the morning.     dexamethasone (DECADRON) 4 MG tablet Take 3 tabs with breakfast the day after each treatment. 21 tablet 1   dicyclomine (BENTYL) 10 MG capsule Take 1 capsule (10 mg total) by mouth 3 (three) times daily as needed for spasms. 45 capsule 0   glucose blood (ACCU-CHEK GUIDE) test strip Use as instructed to check sugars 1-2 times daily. (Patient not taking: No sig reported) 100 each 12   hydrOXYzine (VISTARIL) 25 MG capsule Take 1 capsule (25 mg total) by mouth every 8 (eight) hours as needed. 30 capsule 0   lidocaine-prilocaine (EMLA) cream APPLY 1 APPLICATION TOPICALLY AS NEEDED. (Patient not taking: No sig reported) 30 g 0   loratadine (CLARITIN) 10 MG tablet Take 10 mg by mouth daily.     meclizine (ANTIVERT) 25 MG tablet Take 1 tablet (25 mg total) by mouth 3 (three) times daily as needed for dizziness. 30 tablet 0   metFORMIN (GLUCOPHAGE) 500 MG tablet TAKE 1 TABLET(500 MG) BY MOUTH TWICE DAILY WITH A MEAL 180 tablet 1   mometasone (NASONEX) 50 MCG/ACT nasal spray Place 2 sprays into the nose daily. (Patient taking differently: Place 2 sprays into the nose daily as needed (allergies.).) 17 g 12   Multiple Vitamin (MULTIVITAMIN WITH MINERALS) TABS tablet Take 1 tablet by mouth daily. Centrum for Women 50+     pantoprazole (PROTONIX) 40 MG tablet Take 1 tablet (40 mg total) by mouth 2 (two) times daily. 180 tablet 0   potassium chloride SA (KLOR-CON) 20 MEQ tablet TAKE 1 TABLET BY MOUTH  TWICE DAILY 180 tablet 3   pregabalin (LYRICA) 50 MG capsule TAKE 1 CAPSULE(50 MG)  BY MOUTH THREE TIMES DAILY (Patient not taking: No sig reported) 270 capsule 1   promethazine (PHENERGAN) 25 MG tablet Take 1/2 tablet in the morning then every 6 hours as needed for nausea 50 tablet 3   tiZANidine (ZANAFLEX) 4 MG tablet TAKE 1 TABLET(4 MG) BY MOUTH AT BEDTIME 90 tablet 1   traZODone (DESYREL) 100 MG tablet TAKE 1 TABLET BY MOUTH AT  BEDTIME 90 tablet 2   triamcinolone cream (KENALOG) 0.1 % Apply 1 application topically 2 (two) times daily. 30 g 0   Vitamin D, Ergocalciferol, (DRISDOL) 1.25 MG (50000 UNIT) CAPS capsule TAKE 1 CAPSULE BY MOUTH 1  TIME A WEEK 13 capsule 3   No current facility-administered medications for this visit.    Allergies  Allergen Reactions   Bacitracin-Neomycin-Polymyxin  [Neomycin-Bacitracin Zn-Polymyx] Swelling   Nsaids Other (See Comments)    nosebleeds   Tape Hives    Pt cannot tolerate bandaids, tape, or any other adhesives.  Ambien [Zolpidem Tartrate]     Hives    Amoxicillin     Rash    Clavulanic Acid Hives   Contrast Media [Iodinated Diagnostic Agents] Hives    Pt states hives with prior ct, was given benadryl to resolve   Latex Swelling   Prednisone Swelling    Pt tolerates Dexamethasone. Throat swelling    Social History   Socioeconomic History   Marital status: Divorced    Spouse name: Not on file   Number of children: 7   Years of education: Not on file   Highest education level: Not on file  Occupational History   Occupation: retired  Tobacco Use   Smoking status: Former    Packs/day: 1.00    Years: 20.00    Pack years: 20.00    Types: Cigarettes    Quit date: 07/30/2011    Years since quitting: 9.8   Smokeless tobacco: Never   Tobacco comments:    Quit >4 years ago; 1 ppd for about 5/20 years (remaining was less)  Vaping Use   Vaping Use: Former  Substance and Sexual Activity   Alcohol use: No    Alcohol/week: 0.0 standard drinks   Drug use: No   Sexual activity: Not Currently  Other Topics Concern    Not on file  Social History Narrative   Lives alone.  Retired.  Education:  11th grade GED.  Children:  7 (one here).    Social Determinants of Health   Financial Resource Strain: Low Risk    Difficulty of Paying Living Expenses: Not hard at all  Food Insecurity: Not on file  Transportation Needs: Not on file  Physical Activity: Inactive   Days of Exercise per Week: 0 days   Minutes of Exercise per Session: 0 min  Stress: No Stress Concern Present   Feeling of Stress : Not at all  Social Connections: Moderately Isolated   Frequency of Communication with Friends and Family: More than three times a week   Frequency of Social Gatherings with Friends and Family: Once a week   Attends Religious Services: 1 to 4 times per year   Active Member of [employer] or Organizations: No   Attends Archivist Meetings: Never   Marital Status: Divorced  Human resources officer Violence: Not At Risk   Fear of Current or Ex-Partner: No   Emotionally Abused: No   Physically Abused: No   Sexually Abused: No    Review of Systems  Constitutional: Negative.   HENT:  Positive for congestion, sinus pain and sore throat.   Eyes: Negative.   Respiratory:  Positive for cough (due to pnd). Negative for shortness of breath.   Cardiovascular: Negative.   Gastrointestinal:  Positive for nausea (when swallowing mucus). Negative for abdominal pain, blood in stool, constipation, diarrhea, heartburn, melena and vomiting.  Genitourinary: Negative.   Musculoskeletal: Negative.   Skin: Negative.   Neurological: Negative.   Endo/Heme/Allergies: Negative.   Psychiatric/Behavioral: Negative.    All other systems reviewed and are negative.  Objective   Vitals as reported by the patient: There were no vitals filed for this visit.  There are no diagnoses linked to this encounter.  PLAN Given 8 days of symptoms, will treat as bacterial with zpack and azelastine Return precautions reviewed Reviewed nonpharm  management Patient encouraged to call clinic with any questions, comments, or concerns.  I discussed the assessment and treatment plan with the patient. The patient was provided an opportunity to ask questions and all  were answered. The patient agreed with the plan and demonstrated an understanding of the instructions.   The patient was advised to call back or seek an in-person evaluation if the symptoms worsen or if the condition fails to improve as anticipated.  I provided 16 minutes of non-face-to-face time during this encounter.  [name], NP

## 2021-05-31 ENCOUNTER — Inpatient Hospital Stay: Payer: Medicare Other

## 2021-05-31 ENCOUNTER — Other Ambulatory Visit: Payer: Self-pay

## 2021-05-31 ENCOUNTER — Ambulatory Visit: Payer: Medicare Other

## 2021-05-31 ENCOUNTER — Inpatient Hospital Stay: Payer: Medicare Other | Attending: Hematology | Admitting: Hematology

## 2021-05-31 ENCOUNTER — Telehealth: Payer: Self-pay | Admitting: Gastroenterology

## 2021-05-31 VITALS — BP 118/55 | HR 80 | Temp 98.3°F | Resp 17 | Wt 178.2 lb

## 2021-05-31 VITALS — BP 124/74 | HR 83 | Temp 97.3°F | Resp 18

## 2021-05-31 DIAGNOSIS — Z5112 Encounter for antineoplastic immunotherapy: Secondary | ICD-10-CM | POA: Insufficient documentation

## 2021-05-31 DIAGNOSIS — Z7189 Other specified counseling: Secondary | ICD-10-CM

## 2021-05-31 DIAGNOSIS — Z853 Personal history of malignant neoplasm of breast: Secondary | ICD-10-CM | POA: Insufficient documentation

## 2021-05-31 DIAGNOSIS — Z923 Personal history of irradiation: Secondary | ICD-10-CM | POA: Insufficient documentation

## 2021-05-31 DIAGNOSIS — N189 Chronic kidney disease, unspecified: Secondary | ICD-10-CM | POA: Diagnosis not present

## 2021-05-31 DIAGNOSIS — E1122 Type 2 diabetes mellitus with diabetic chronic kidney disease: Secondary | ICD-10-CM | POA: Diagnosis not present

## 2021-05-31 DIAGNOSIS — D509 Iron deficiency anemia, unspecified: Secondary | ICD-10-CM | POA: Insufficient documentation

## 2021-05-31 DIAGNOSIS — C7951 Secondary malignant neoplasm of bone: Secondary | ICD-10-CM

## 2021-05-31 DIAGNOSIS — E1140 Type 2 diabetes mellitus with diabetic neuropathy, unspecified: Secondary | ICD-10-CM | POA: Diagnosis not present

## 2021-05-31 DIAGNOSIS — Z79899 Other long term (current) drug therapy: Secondary | ICD-10-CM | POA: Insufficient documentation

## 2021-05-31 DIAGNOSIS — Z95828 Presence of other vascular implants and grafts: Secondary | ICD-10-CM

## 2021-05-31 DIAGNOSIS — Z8673 Personal history of transient ischemic attack (TIA), and cerebral infarction without residual deficits: Secondary | ICD-10-CM | POA: Insufficient documentation

## 2021-05-31 DIAGNOSIS — C9000 Multiple myeloma not having achieved remission: Secondary | ICD-10-CM

## 2021-05-31 DIAGNOSIS — I252 Old myocardial infarction: Secondary | ICD-10-CM | POA: Diagnosis not present

## 2021-05-31 DIAGNOSIS — Z87891 Personal history of nicotine dependence: Secondary | ICD-10-CM | POA: Diagnosis not present

## 2021-05-31 DIAGNOSIS — E538 Deficiency of other specified B group vitamins: Secondary | ICD-10-CM | POA: Insufficient documentation

## 2021-05-31 LAB — CBC WITH DIFFERENTIAL (CANCER CENTER ONLY)
Abs Immature Granulocytes: 0.01 10*3/uL (ref 0.00–0.07)
Basophils Absolute: 0 10*3/uL (ref 0.0–0.1)
Basophils Relative: 1 %
Eosinophils Absolute: 0.1 10*3/uL (ref 0.0–0.5)
Eosinophils Relative: 3 %
HCT: 30.7 % — ABNORMAL LOW (ref 36.0–46.0)
Hemoglobin: 10.2 g/dL — ABNORMAL LOW (ref 12.0–15.0)
Immature Granulocytes: 0 %
Lymphocytes Relative: 42 %
Lymphs Abs: 1.7 10*3/uL (ref 0.7–4.0)
MCH: 31 pg (ref 26.0–34.0)
MCHC: 33.2 g/dL (ref 30.0–36.0)
MCV: 93.3 fL (ref 80.0–100.0)
Monocytes Absolute: 0.3 10*3/uL (ref 0.1–1.0)
Monocytes Relative: 7 %
Neutro Abs: 1.9 10*3/uL (ref 1.7–7.7)
Neutrophils Relative %: 47 %
Platelet Count: 287 10*3/uL (ref 150–400)
RBC: 3.29 MIL/uL — ABNORMAL LOW (ref 3.87–5.11)
RDW: 13.4 % (ref 11.5–15.5)
WBC Count: 4.1 10*3/uL (ref 4.0–10.5)
nRBC: 0 % (ref 0.0–0.2)

## 2021-05-31 LAB — CMP (CANCER CENTER ONLY)
ALT: 16 U/L (ref 0–44)
AST: 19 U/L (ref 15–41)
Albumin: 3.6 g/dL (ref 3.5–5.0)
Alkaline Phosphatase: 82 U/L (ref 38–126)
Anion gap: 12 (ref 5–15)
BUN: 11 mg/dL (ref 8–23)
CO2: 21 mmol/L — ABNORMAL LOW (ref 22–32)
Calcium: 9.2 mg/dL (ref 8.9–10.3)
Chloride: 111 mmol/L (ref 98–111)
Creatinine: 1.43 mg/dL — ABNORMAL HIGH (ref 0.44–1.00)
GFR, Estimated: 38 mL/min — ABNORMAL LOW (ref >60)
Glucose, Bld: 128 mg/dL — ABNORMAL HIGH (ref 70–99)
Potassium: 4.4 mmol/L (ref 3.5–5.1)
Sodium: 144 mmol/L (ref 135–145)
Total Bilirubin: 0.2 mg/dL — ABNORMAL LOW (ref 0.3–1.2)
Total Protein: 6.6 g/dL (ref 6.5–8.1)

## 2021-05-31 MED ORDER — CYANOCOBALAMIN 1000 MCG/ML IJ SOLN
1000.0000 ug | Freq: Once | INTRAMUSCULAR | Status: DC
  Filled 2021-05-31: qty 1

## 2021-05-31 MED ORDER — SODIUM CHLORIDE 0.9 % IV SOLN
Freq: Once | INTRAVENOUS | Status: AC
Start: 2021-05-31 — End: 2021-05-31

## 2021-05-31 MED ORDER — FAMOTIDINE 20 MG IN NS 100 ML IVPB
20.0000 mg | Freq: Once | INTRAVENOUS | Status: AC
  Administered 2021-05-31: 20 mg via INTRAVENOUS
  Filled 2021-05-31: qty 100

## 2021-05-31 MED ORDER — SODIUM CHLORIDE 0.9 % IV SOLN
20.0000 mg | Freq: Once | INTRAVENOUS | Status: AC
  Administered 2021-05-31: 20 mg via INTRAVENOUS
  Filled 2021-05-31: qty 20

## 2021-05-31 MED ORDER — SODIUM CHLORIDE 0.9 % IV SOLN
16.0000 mg/kg | Freq: Once | INTRAVENOUS | Status: AC
  Administered 2021-05-31: 1400 mg via INTRAVENOUS
  Filled 2021-05-31: qty 60

## 2021-05-31 MED ORDER — SODIUM CHLORIDE 0.9% FLUSH
10.0000 mL | INTRAVENOUS | Status: AC | PRN
  Administered 2021-05-31: 10 mL

## 2021-05-31 MED ORDER — SODIUM CHLORIDE 0.9 % IV SOLN: 60.0000 mg | Freq: Once | INTRAVENOUS | Status: DC

## 2021-05-31 MED ORDER — DIPHENHYDRAMINE HCL 25 MG PO CAPS
50.0000 mg | ORAL_CAPSULE | Freq: Once | ORAL | Status: AC
  Administered 2021-05-31: 50 mg via ORAL
  Filled 2021-05-31: qty 2

## 2021-05-31 MED ORDER — HEPARIN SOD (PORK) LOCK FLUSH 100 UNIT/ML IV SOLN
500.0000 [IU] | Freq: Once | INTRAVENOUS | Status: AC | PRN
  Administered 2021-05-31: 500 [IU]

## 2021-05-31 MED ORDER — SODIUM CHLORIDE 0.9% FLUSH
10.0000 mL | INTRAVENOUS | Status: DC | PRN
  Administered 2021-05-31: 10 mL

## 2021-05-31 MED ORDER — CYANOCOBALAMIN 1000 MCG/ML IJ SOLN
1000.0000 ug | Freq: Once | INTRAMUSCULAR | Status: AC
  Administered 2021-05-31: 1000 ug via INTRAMUSCULAR

## 2021-05-31 MED ORDER — ACETAMINOPHEN 325 MG PO TABS
650.0000 mg | ORAL_TABLET | Freq: Once | ORAL | Status: AC
  Administered 2021-05-31: 650 mg via ORAL
  Filled 2021-05-31: qty 2

## 2021-05-31 NOTE — Progress Notes (Signed)
Pt reports having a tooth pulled 1 month ago that she feels is still not completely healed, hold Aredia today per Dr Irene Limbo

## 2021-05-31 NOTE — Telephone Encounter (Signed)
Inbound call from patient requesting procedure results please.

## 2021-05-31 NOTE — Patient Instructions (Signed)
[facility]  Discharge Instructions: °Thank you for choosing [facility] to provide your oncology and hematology care.  ° °If you have a lab appointment with the Cancer Center, please go directly to the Cancer Center and check in at the registration area. °  °Wear comfortable clothing and clothing appropriate for easy access to any Portacath or PICC line.  ° °We strive to give you quality time with your provider. You may need to reschedule your appointment if you arrive late (15 or more minutes).  Arriving late affects you and other patients whose appointments are after yours.  Also, if you miss three or more appointments without notifying the office, you may be dismissed from the clinic at the provider’s discretion.    °  °For prescription refill requests, have your pharmacy contact our office and allow 72 hours for refills to be completed.   ° °Today you received the following chemotherapy and/or immunotherapy agents: Daratumumab.     °  °To help prevent nausea and vomiting after your treatment, we encourage you to take your nausea medication as directed. ° °BELOW ARE SYMPTOMS THAT SHOULD BE REPORTED IMMEDIATELY: °*FEVER GREATER THAN 100.4 F (38 °C) OR HIGHER °*CHILLS OR SWEATING °*NAUSEA AND VOMITING THAT IS NOT CONTROLLED WITH YOUR NAUSEA MEDICATION °*UNUSUAL SHORTNESS OF BREATH °*UNUSUAL BRUISING OR BLEEDING °*URINARY PROBLEMS (pain or burning when urinating, or frequent urination) °*BOWEL PROBLEMS (unusual diarrhea, constipation, pain near the anus) °TENDERNESS IN MOUTH AND THROAT WITH OR WITHOUT PRESENCE OF ULCERS (sore throat, sores in mouth, or a toothache) °UNUSUAL RASH, SWELLING OR PAIN  °UNUSUAL VAGINAL DISCHARGE OR ITCHING  ° °Items with * indicate a potential emergency and should be followed up as soon as possible or go to the Emergency Department if any problems should occur. ° °Please show the CHEMOTHERAPY ALERT CARD or IMMUNOTHERAPY ALERT CARD at check-in  to the Emergency Department and triage nurse. ° °Should you have questions after your visit or need to cancel or reschedule your appointment, please contact [facility]  Dept: [phone]  and follow the prompts.  Office hours are 8:00 a.m. to 4:30 p.m. Monday - Friday. Please note that voicemails left after 4:00 p.m. may not be returned until the following business day.  We are closed weekends and major holidays. You have access to a nurse at all times for urgent questions. Please call the main number to the clinic Dept: [phone] and follow the prompts. ° ° °For any non-urgent questions, you may also contact your provider using MyChart. We now offer e-Visits for anyone 18 and older to request care online for non-urgent symptoms. For details visit [email or web address]. °  °Also download the MyChart app! Go to the app store, search "MyChart", open the app, select [facility], and log in with your MyChart username and password. ° °Due to Covid, a mask is required upon entering the hospital/clinic. If you do not have a mask, one will be given to you upon arrival. For doctor visits, patients may have 1 support person aged 18 or older with them. For treatment visits, patients cannot have anyone with them due to current Covid guidelines and our immunocompromised population.  ° °

## 2021-05-31 NOTE — Telephone Encounter (Signed)
The pt has been advised that a letter was mailed and sent to My Chart.  She was also given the letter contents to the pt over the phone.  No further questions or concerns.    Dear Tracy [name],  The biopsies from your stomach showed no sign of infection, significant inflammation or cancer.  If you have any questions or concerns, please don't hesitate to call.

## 2021-06-01 ENCOUNTER — Telehealth: Payer: Self-pay | Admitting: Hematology

## 2021-06-01 NOTE — Telephone Encounter (Signed)
Left message with follow-up appointment per 1/13 los. °

## 2021-06-04 LAB — MULTIPLE MYELOMA PANEL, SERUM
Albumin SerPl Elph-Mcnc: 3.5 g/dL (ref 2.9–4.4)
Albumin/Glob SerPl: 1.3 (ref 0.7–1.7)
Alpha 1: 0.2 g/dL (ref 0.0–0.4)
Alpha2 Glob SerPl Elph-Mcnc: 1.4 g/dL — ABNORMAL HIGH (ref 0.4–1.0)
B-Globulin SerPl Elph-Mcnc: 0.8 g/dL (ref 0.7–1.3)
Gamma Glob SerPl Elph-Mcnc: 0.3 g/dL — ABNORMAL LOW (ref 0.4–1.8)
Globulin, Total: 2.7 g/dL (ref 2.2–3.9)
IgA: 63 mg/dL — ABNORMAL LOW (ref 64–422)
IgG (Immunoglobin G), Serum: 403 mg/dL — ABNORMAL LOW (ref 586–1602)
IgM (Immunoglobulin M), Srm: 38 mg/dL (ref 26–217)
M Protein SerPl Elph-Mcnc: 0.1 g/dL — ABNORMAL HIGH
Total Protein ELP: 6.2 g/dL (ref 6.0–8.5)

## 2021-06-06 ENCOUNTER — Encounter: Payer: Self-pay | Admitting: Hematology

## 2021-06-06 NOTE — Progress Notes (Signed)
[facility] Follow up:   DOS .05/31/2021  Tracy Minium, MD [address] [place] [address]   DIAGNOSIS: Cancer Staging History of right breast cancer Staging form: Breast, AJCC 7th Edition - Clinical stage from 06/13/2015: Stage IA (T1b, N0, M0) - Unsigned Laterality: Right - Pathologic stage from 07/10/2015: Stage IA (T1c, N0, cM0) - Unsigned Staged by: Pathologist Laterality: Right Estrogen receptor status: Positive Progesterone receptor status: Positive HER2 status: Negative Stage used in treatment planning: Yes National guidelines used in treatment planning: Yes Type of national guideline used in treatment planning: NCCN Staging comments: Staged on surgical specimen by Dr. Lyndon Code   SUMMARY OF ONCOLOGIC HISTORY: Oncology History  History of right breast cancer  04/16/2000 Surgery   Left breast: Triple negative  invasive ductal cancer treated with lumpectomy, adjuvant chemotherapy, radiation , in [place], unknown stage   06/07/2015 Mammogram   Right breast mass 6x 6 x 5 mm, right axillary lymph node with slight cortex thickening measured 5 mm    06/13/2015 Initial Diagnosis   Right breast needle biopsy: Invasive ductal carcinoma, grade 1, right axillary lymph node biopsy negative , ER 95%, PR 5%, Ki-67 10%, HER-2 negative   06/13/2015 Clinical Stage   Stage IA: T1b N0   07/07/2015 Surgery   Right lumpectomy: Invasive ductal carcinoma grade 1, 1 cm span, with low-grade DCIS, DCIS focally 0.1 cm to inferior margin, 0/3 lymph nodes negative, ER 95%, PR 5%, HER-2 negative ratio 1.1, Ki-67 10%   07/07/2015 Pathologic Stage   Stage IA: T1c N0   07/13/2015 Procedure   Breast High/Moderate Risk Panel reveals no clinically significant variant at ATM, BRCA1, BRCA2, CDH1, CHEK2, PALB2, PTEN, and TP53.     08/23/2015 - 09/21/2015 Radiation Therapy   Adjuvant Radiation: Right breast/ 42.5Gy at 2.5 Gy per fraction x 17 fractions.   Right breast  boost/ 7.5 Gy at 2.5 Gy per fraction x 3 fractions    Anti-estrogen oral therapy   Patient refused antiestrogen therapy   10/20/2015 Survivorship   Survivorship care plan completed and mailed to patient in lieu of in person visit at her request   Iron deficiency anemia  Multiple myeloma ([facility])  01/25/2020 Initial Diagnosis   Multiple myeloma ([facility])   02/11/2020 - 05/19/2020 Adjuvant Chemotherapy   Daratumumab Weekly    02/29/2020 - 03/13/2020 Radiation Therapy   The targets were treated to a total dose of 20 Gy in 10 fractions of 2 Gy each to the left hip and L5 using one plan.   06/02/2020 - 10/05/2020 Adjuvant Chemotherapy   Daratumumab and Carfilzomib   10/19/2020 -  Adjuvant Chemotherapy   Maintenance Daratumumab--initially every 2 weeks, then every 4 weeks.    Bone metastases ([facility])  02/08/2021 Initial Diagnosis   Bone metastases ([facility])     CURRENT THERAPY: Daratumumab/B12 injection, Pamidronate  INTERVAL HISTORY:  Tracy [name] 75 y.o. female returns for evaluation of her multiple myeloma prior to receiving her every 4 week Daratumumab.  She was last seen in our clinic on 05/01/2021.  Patient notes no acute symptoms since her last clinic visit. She had an upper GI endoscopy with Dr. Owens Loffler on 05/21/2021 which showed mild inflammation in her stomach with erythema and mucosal friability. Notes mild nausea.  Denies any overt melena or hematochezia.  Labs done today 05/31/2021 CBC shows hemoglobin of 10.2 with a normal WBC count and platelets CMP stable with chronic kidney disease creatinine of 1.43 Myeloma panel  0.1 g/dL IgG kappa antibody which could be from the daratumumab.  No other acute new symptoms.  Patient Active Problem List   Diagnosis Date Noted   Bone metastases ([facility]) 02/08/2021   Angiodysplasia of intestine 08/23/2020   Port-A-Cath in place 08/04/2020   Counseling regarding advance care planning and goals of care 02/07/2020   Multiple myeloma ([facility])  01/25/2020   Dizziness 10/12/2019   Post-nasal drainage 10/12/2019   Allergic rhinitis 08/19/2018   Type 2 diabetes mellitus with diabetic neuropathy, unspecified ([facility]) 11/05/2017   Encounter for long-term use of muscle relaxants 09/24/2017   CAD in native artery 09/24/2017   OSA (obstructive sleep apnea) 09/24/2017   [place] 09/24/2017   Asthenia 06/30/2017   Morbid obesity ([facility]) 06/02/2017   Depression 06/02/2017   Iron deficiency anemia 09/23/2016   Anemia of chronic disease 09/28/2015   Genetic testing 08/21/2015   History of left breast cancer 07/13/2015   History of right breast cancer 06/20/2015   Hearing loss due to cerumen impaction 12/29/2014   Allergy to adhesive tape 05/19/2014   Hyperlipidemia 12/06/2013   GERD (gastroesophageal reflux disease) 12/06/2013   Cervical disc disease 11/11/2013   Osteopenia 05/25/2013   Allergic asthma 12/24/2012   Insomnia 04/02/2012   Physical exam, annual 04/02/2012   HTN (hypertension) 02/03/2012   Vertigo, benign positional 02/03/2012   Left groin pain 02/03/2012   Hip pain 10/10/2011   TIA (transient ischemic attack) 07/24/2011   Allergic reaction 07/05/2011   Osteoarthrosis involving lower leg 10/19/2010   Disorder of bone and cartilage 05/01/2010   Generalized anxiety disorder 05/01/2010   Vitamin D deficiency 02/20/2010   Unspecified chronic bronchitis ([facility]) 08/24/2009   Other lymphedema 10/29/2007    is allergic to bacitracin-neomycin-polymyxin  [neomycin-bacitracin zn-polymyx], nsaids, tape, ambien [zolpidem tartrate], amoxicillin, clavulanic acid, contrast media [iodinated diagnostic agents], latex, and prednisone.  MEDICAL HISTORY: Past Medical History:  Diagnosis Date   Angiodysplasia of intestine 08/23/2020   Anxiety    Arthritis    Breast cancer ([facility]) 06/13/15   Cancer ([facility]) 2000   breast cancer   Chronic bronchitis ([facility])    Chronic bronchitis ([facility])    Hyperlipidemia    Hypertension    Myocardial  infarction [facility]) 2001   Personal history of radiation therapy    Restless leg    Stroke ([facility]) 2004   TIA, no deficits    SURGICAL HISTORY: Past Surgical History:  Procedure Laterality Date   ABDOMINAL HYSTERECTOMY  1985   BREAST LUMPECTOMY Left 2000   radiation and chemo   BREAST LUMPECTOMY Right 2016   radiation   BREAST SURGERY  2001   lt breast lumpectomy   COLONOSCOPY WITH ESOPHAGOGASTRODUODENOSCOPY (EGD)  04/2020   ENTEROSCOPY N/A 03/15/2021   Procedure: ENTEROSCOPY;  Surgeon: Milus Banister, MD;  Location: [facility];  Service: Endoscopy;  Laterality: N/A;   GIVENS CAPSULE STUDY  07/2020   HOT HEMOSTASIS N/A 03/15/2021   Procedure: HOT HEMOSTASIS (ARGON PLASMA COAGULATION/BICAP);  Surgeon: Milus Banister, MD;  Location: [facility];  Service: Endoscopy;  Laterality: N/A;   IR IMAGING GUIDED PORT INSERTION  04/25/2020   RADIOACTIVE SEED GUIDED PARTIAL MASTECTOMY WITH AXILLARY SENTINEL LYMPH NODE BIOPSY Right 07/07/2015   Procedure: RIGHT RADIOACTIVE SEED GUIDED PARTIAL MASTECTOMY WITH AXILLARY SENTINEL LYMPH NODE BIOPSY;  Surgeon: Autumn Messing III, MD;  Location: [facility];  Service: General;  Laterality: Right;   SMALL INTESTINE SURGERY     TUBAL LIGATION     UPPER GASTROINTESTINAL ENDOSCOPY  SOCIAL HISTORY: Social History   Socioeconomic History   Marital status: Divorced    Spouse name: Not on file   Number of children: 7   Years of education: Not on file   Highest education level: Not on file  Occupational History   Occupation: retired  Tobacco Use   Smoking status: Former    Packs/day: 1.00    Years: 20.00    Pack years: 20.00    Types: Cigarettes    Quit date: 07/30/2011    Years since quitting: 9.8   Smokeless tobacco: Never   Tobacco comments:    Quit >4 years ago; 1 ppd for about 5/20 years (remaining was less)  Vaping Use   Vaping Use: Former  Substance and Sexual Activity   Alcohol use: No    Alcohol/week: 0.0 standard  drinks   Drug use: No   Sexual activity: Not Currently  Other Topics Concern   Not on file  Social History Narrative   Lives alone.  Retired.  Education:  11th grade GED.  Children:  7 (one here).    Social Determinants of Health   Financial Resource Strain: Low Risk    Difficulty of Paying Living Expenses: Not hard at all  Food Insecurity: Not on file  Transportation Needs: Not on file  Physical Activity: Inactive   Days of Exercise per Week: 0 days   Minutes of Exercise per Session: 0 min  Stress: No Stress Concern Present   Feeling of Stress : Not at all  Social Connections: Moderately Isolated   Frequency of Communication with Friends and Family: More than three times a week   Frequency of Social Gatherings with Friends and Family: Once a week   Attends Religious Services: 1 to 4 times per year   Active Member of [employer] or Organizations: No   Attends Music therapist: Never   Marital Status: Divorced  Human resources officer Violence: Not At Risk   Fear of Current or Ex-Partner: No   Emotionally Abused: No   Physically Abused: No   Sexually Abused: No    FAMILY HISTORY: Family History  Problem Relation Age of Onset   Emphysema Mother 54       smoker   Diabetes Father    Lung cancer Sister        dx. <50; former smoker   Diabetes Brother    Diabetes Brother    Brain cancer Brother 59       unknown tumor type   Diabetes Paternal Aunt    Stroke Maternal Grandmother    Diabetes Paternal Grandmother    Cancer Daughter 10       neck cancer   Other Daughter        hysterectomy for unspecified reason   Colon cancer Daughter    Breast cancer Cousin    Cancer Cousin        unspecified type   Breast cancer Other        triple negative breast cancer in her 48s   Colon polyps Neg Hx    Esophageal cancer Neg Hx    Gallbladder disease Neg Hx    .10 Point review of Systems was done is negative except as noted above.   PHYSICAL EXAMINATION  ECOG PERFORMANCE  STATUS: 1 - Symptomatic but completely ambulatory  Vitals:   05/31/21 0921  BP: 124/74  Pulse: 83  Resp: 18  Temp: (!) 97.3 F (36.3 C)  SpO2: 100%   GENERAL:alert, in no acute distress  and comfortable SKIN: no acute rashes, no significant lesions EYES: conjunctiva are pink and non-injected, sclera anicteric OROPHARYNX: MMM, no exudates, no oropharyngeal erythema or ulceration NECK: supple, no JVD LYMPH:  no palpable lymphadenopathy in the cervical, axillary or inguinal regions LUNGS: clear to auscultation b/l with normal respiratory effort HEART: regular rate & rhythm ABDOMEN:  normoactive bowel sounds , non tender, not distended. Extremity: no pedal edema PSYCH: alert & oriented x 3 with fluent speech NEURO: no focal motor/sensory deficits   LABORATORY DATA: . CBC Latest Ref Rng & Units 05/31/2021 05/03/2021 04/30/2021  WBC 4.0 - 10.5 K/uL 4.1 4.5 5.4  Hemoglobin 12.0 - 15.0 g/dL 10.2(L) 10.0(L) 11.4(L)  Hematocrit 36.0 - 46.0 % 30.7(L) 30.4(L) 34.5(L)  Platelets 150 - 400 K/uL 287 288 350   . CMP Latest Ref Rng & Units 05/31/2021 05/03/2021 04/30/2021  Glucose 70 - 99 mg/dL 128(H) 121(H) 120(H)  BUN 8 - 23 mg/dL '11 13 15  ' Creatinine 0.44 - 1.00 mg/dL 1.43(H) 1.69(H) 1.65(H)  Sodium 135 - 145 mmol/L 144 141 139  Potassium 3.5 - 5.1 mmol/L 4.4 4.6 4.7  Chloride 98 - 111 mmol/L 111 112(H) 107  CO2 22 - 32 mmol/L 21(L) 19(L) 20(L)  Calcium 8.9 - 10.3 mg/dL 9.2 9.0 9.1  Total Protein 6.5 - 8.1 g/dL 6.6 6.7 7.7  Total Bilirubin 0.3 - 1.2 mg/dL 0.2(L) 0.3 0.3  Alkaline Phos 38 - 126 U/L 82 75 74  AST 15 - 41 U/L '19 21 16  ' ALT 0 - 44 U/L '16 13 13     ' ASSESSMENT and THERAPY PLAN:   75 yo with    1)  IgG Kappa Multiple myeloma with bone lesions, anemia, renal insuff. M spike @ 3.7g/dl on diagnosis. 1p deletion, polymorphic variant, 13q deletion 2) h/o Dm2 3) Diabetic Neuropathy 4) CKD - likely from DM2, but could have an element of myeloma kidney. 5) h/o TIA and  AMI 6) Iron deficiency     PLAN: -Discussed pt labwork today,-05/31/2021 CBC shows hemoglobin of 10.2 with a normal WBC count and platelets CMP stable with chronic kidney disease creatinine of 1.43 Myeloma panel 0.1 g/dL IgG kappa antibody which could be from the daratumumab.  -Patient recommended drinking at least 64 ounces of water daily and addressing her antihypertensive dosing with her primary care physician to avoid hypotension. -No primary toxicities from continued maintenance daratumumab every 4 weeks at this time. -Continue [place] Vitamin B12 q4weeks. -Continue Aredia every 8 weeks. -Continue 50000 IU Vitamin D weekly. -Continue Vitamin B-Complex daily.  Follow-up Please schedule next 3 cycles of monthly daratumumab with port flush and labs and MD visits Continue Aredia 8 weeks  . The total time spent in the appointment was 30 minutes and more than 50% was on counseling and direct patient cares, ordering monitoring and management of antineoplastic immunotherapy    [name] MD MS Hematology/Oncology Physician [facility]

## 2021-06-07 ENCOUNTER — Ambulatory Visit (INDEPENDENT_AMBULATORY_CARE_PROVIDER_SITE_OTHER): Payer: Medicare Other | Admitting: Family Medicine

## 2021-06-07 ENCOUNTER — Encounter: Payer: Self-pay | Admitting: Family Medicine

## 2021-06-07 VITALS — BP 120/76 | HR 111 | Temp 97.2°F | Resp 16 | Wt 172.8 lb

## 2021-06-07 DIAGNOSIS — R109 Unspecified abdominal pain: Secondary | ICD-10-CM | POA: Diagnosis not present

## 2021-06-07 LAB — POCT URINALYSIS DIPSTICK
Blood, UA: NEGATIVE
Glucose, UA: NEGATIVE
Ketones, UA: 5
Nitrite, UA: NEGATIVE
Protein, UA: NEGATIVE
Urobilinogen, UA: 0.2 E.U./dL
pH, UA: 5 (ref 5.0–8.0)

## 2021-06-07 NOTE — Progress Notes (Signed)
   Subjective:    Patient ID: Tracy [name], female    DOB: 02-17-1946, 75 y.o.   MRN: [id number]  HPI R flank pain- pt reports pain will radiate around R side and into R pelvis.  Not related to any particular movement.  At times will radiate from pelvis to flank.  Denies urinary sxs.  Sxs started ~1 month ago.  Things had calmed down but this weekend she turned over in bed and had a shooting pain.  Pt reports nausea is returning   Review of Systems For ROS see HPI   This visit occurred during the SARS-CoV-2 public health emergency.  Safety protocols were in place, including screening questions prior to the visit, additional usage of staff PPE, and extensive cleaning of exam room while observing appropriate contact time as indicated for disinfecting solutions.      Objective:   Physical Exam Vitals reviewed.  Constitutional:      General: She is not in acute distress.    Appearance: Normal appearance. She is not ill-appearing.  HENT:     Head: Normocephalic and atraumatic.  Cardiovascular:     Rate and Rhythm: Regular rhythm. Tachycardia present.     Heart sounds: Normal heart sounds.  Pulmonary:     Effort: Pulmonary effort is normal. No respiratory distress.     Breath sounds: Normal breath sounds. No wheezing.  Abdominal:     General: There is no distension.     Palpations: Abdomen is soft.     Tenderness: There is abdominal tenderness (over R flank and R pelvis). There is guarding (voluntary). There is no rebound.  Neurological:     Mental Status: She is alert.           Assessment & Plan:  R flank pain- new.  UA is fairly unremarkable but will send for culture.  Need to get renal stone study to assess for kidney stone.  Sxs initially started 1 month ago but had calmed down until just recently.  Now again having shooting pain and nausea.  Encouraged lots of water.  Tylenol for pain.  Heat/ice.  Will make additional recommendations pending result of CT scan.  Pt expressed  understanding and is in agreement w/ plan.

## 2021-06-07 NOTE — Patient Instructions (Addendum)
We'll notify you of your lab results and make any changes if needed We'll call with your CT scan Continue to drink LOTS of water to try and flush the stone Take tylenol as needed for pain Heat or ice to help w/ pain Call with any questions or concern Hang in there!!!

## 2021-06-08 ENCOUNTER — Ambulatory Visit (HOSPITAL_COMMUNITY)
Admission: RE | Admit: 2021-06-08 | Discharge: 2021-06-08 | Disposition: A | Discharge status: Home / Self Care | Payer: Medicare Other | Source: Ambulatory Visit | Attending: Family Medicine | Admitting: Family Medicine

## 2021-06-08 ENCOUNTER — Other Ambulatory Visit: Payer: Self-pay

## 2021-06-08 DIAGNOSIS — R109 Unspecified abdominal pain: Secondary | ICD-10-CM | POA: Diagnosis not present

## 2021-06-09 LAB — URINE CULTURE
MICRO NUMBER:: 12621562
SPECIMEN QUALITY:: ADEQUATE

## 2021-06-11 ENCOUNTER — Telehealth: Payer: Self-pay | Admitting: Family Medicine

## 2021-06-11 MED ORDER — HYDROCODONE-ACETAMINOPHEN 5-325 MG PO TABS: 1.0000 | ORAL_TABLET | Freq: Four times a day (QID) | ORAL | 0 refills | Status: DC | PRN

## 2021-06-11 NOTE — Telephone Encounter (Signed)
Prescription sent at pt's request 

## 2021-06-11 NOTE — Telephone Encounter (Signed)
She should use the Zofran (Ondansetron) as needed for nausea.  If the pain is still intense, we can send in a prescription for hydrocodone.  I want her to inform Dr Irene Limbo and her cancer team of her current sxs as they need to know what's going on and can maybe help w/ symptom management.

## 2021-06-11 NOTE — Telephone Encounter (Signed)
Patient would like to know what she should do for the pain and nausea since her urine was clear.  Please advise.  Please call her at [phone]

## 2021-06-11 NOTE — Telephone Encounter (Signed)
Called to advise patient about concerns per pcp. Patient line is busy.

## 2021-06-11 NOTE — Telephone Encounter (Signed)
Called and spoke with patient and advised her about concerns per you. Patient stated that she will contact her cancer team and update them on her sxs. She also stated that she would like the hydrocodone sent to pharmacy.

## 2021-06-12 ENCOUNTER — Other Ambulatory Visit: Payer: Self-pay | Admitting: Family Medicine

## 2021-06-12 DIAGNOSIS — K148 Other diseases of tongue: Secondary | ICD-10-CM | POA: Diagnosis not present

## 2021-06-12 DIAGNOSIS — E1140 Type 2 diabetes mellitus with diabetic neuropathy, unspecified: Secondary | ICD-10-CM

## 2021-06-12 DIAGNOSIS — J019 Acute sinusitis, unspecified: Secondary | ICD-10-CM | POA: Diagnosis not present

## 2021-06-12 DIAGNOSIS — H6123 Impacted cerumen, bilateral: Secondary | ICD-10-CM | POA: Diagnosis not present

## 2021-06-12 DIAGNOSIS — J343 Hypertrophy of nasal turbinates: Secondary | ICD-10-CM | POA: Diagnosis not present

## 2021-06-12 DIAGNOSIS — L299 Pruritus, unspecified: Secondary | ICD-10-CM | POA: Diagnosis not present

## 2021-06-12 DIAGNOSIS — H938X3 Other specified disorders of ear, bilateral: Secondary | ICD-10-CM | POA: Diagnosis not present

## 2021-06-27 ENCOUNTER — Ambulatory Visit: Payer: Medicare Other | Admitting: Gastroenterology

## 2021-06-28 ENCOUNTER — Other Ambulatory Visit: Payer: Self-pay

## 2021-06-28 ENCOUNTER — Inpatient Hospital Stay: Payer: Medicare Other | Attending: Hematology

## 2021-06-28 ENCOUNTER — Inpatient Hospital Stay: Payer: Medicare Other

## 2021-06-28 ENCOUNTER — Inpatient Hospital Stay (HOSPITAL_BASED_OUTPATIENT_CLINIC_OR_DEPARTMENT_OTHER): Payer: Medicare Other | Admitting: Hematology

## 2021-06-28 VITALS — BP 157/70 | HR 79 | Temp 98.7°F | Resp 20

## 2021-06-28 VITALS — BP 172/84 | HR 81 | Temp 97.2°F | Resp 20 | Wt 178.2 lb

## 2021-06-28 DIAGNOSIS — Z5112 Encounter for antineoplastic immunotherapy: Secondary | ICD-10-CM | POA: Diagnosis not present

## 2021-06-28 DIAGNOSIS — D509 Iron deficiency anemia, unspecified: Secondary | ICD-10-CM | POA: Insufficient documentation

## 2021-06-28 DIAGNOSIS — Z95828 Presence of other vascular implants and grafts: Secondary | ICD-10-CM

## 2021-06-28 DIAGNOSIS — Z853 Personal history of malignant neoplasm of breast: Secondary | ICD-10-CM | POA: Diagnosis not present

## 2021-06-28 DIAGNOSIS — N189 Chronic kidney disease, unspecified: Secondary | ICD-10-CM | POA: Insufficient documentation

## 2021-06-28 DIAGNOSIS — C9000 Multiple myeloma not having achieved remission: Secondary | ICD-10-CM

## 2021-06-28 DIAGNOSIS — Z923 Personal history of irradiation: Secondary | ICD-10-CM | POA: Diagnosis not present

## 2021-06-28 DIAGNOSIS — C7951 Secondary malignant neoplasm of bone: Secondary | ICD-10-CM

## 2021-06-28 DIAGNOSIS — I252 Old myocardial infarction: Secondary | ICD-10-CM | POA: Insufficient documentation

## 2021-06-28 DIAGNOSIS — E1140 Type 2 diabetes mellitus with diabetic neuropathy, unspecified: Secondary | ICD-10-CM | POA: Diagnosis not present

## 2021-06-28 DIAGNOSIS — Z8673 Personal history of transient ischemic attack (TIA), and cerebral infarction without residual deficits: Secondary | ICD-10-CM | POA: Diagnosis not present

## 2021-06-28 DIAGNOSIS — Z87891 Personal history of nicotine dependence: Secondary | ICD-10-CM | POA: Diagnosis not present

## 2021-06-28 DIAGNOSIS — E538 Deficiency of other specified B group vitamins: Secondary | ICD-10-CM | POA: Diagnosis not present

## 2021-06-28 DIAGNOSIS — E1122 Type 2 diabetes mellitus with diabetic chronic kidney disease: Secondary | ICD-10-CM | POA: Diagnosis not present

## 2021-06-28 DIAGNOSIS — Z79899 Other long term (current) drug therapy: Secondary | ICD-10-CM | POA: Diagnosis not present

## 2021-06-28 DIAGNOSIS — Z7189 Other specified counseling: Secondary | ICD-10-CM

## 2021-06-28 LAB — CBC WITH DIFFERENTIAL (CANCER CENTER ONLY)
Abs Immature Granulocytes: 0.01 10*3/uL (ref 0.00–0.07)
Basophils Absolute: 0 10*3/uL (ref 0.0–0.1)
Basophils Relative: 1 %
Eosinophils Absolute: 0.1 10*3/uL (ref 0.0–0.5)
Eosinophils Relative: 3 %
HCT: 28.5 % — ABNORMAL LOW (ref 36.0–46.0)
Hemoglobin: 9.5 g/dL — ABNORMAL LOW (ref 12.0–15.0)
Immature Granulocytes: 0 %
Lymphocytes Relative: 32 %
Lymphs Abs: 1.7 10*3/uL (ref 0.7–4.0)
MCH: 31.1 pg (ref 26.0–34.0)
MCHC: 33.3 g/dL (ref 30.0–36.0)
MCV: 93.4 fL (ref 80.0–100.0)
Monocytes Absolute: 0.4 10*3/uL (ref 0.1–1.0)
Monocytes Relative: 7 %
Neutro Abs: 3.1 10*3/uL (ref 1.7–7.7)
Neutrophils Relative %: 57 %
Platelet Count: 277 10*3/uL (ref 150–400)
RBC: 3.05 MIL/uL — ABNORMAL LOW (ref 3.87–5.11)
RDW: 13.3 % (ref 11.5–15.5)
WBC Count: 5.3 10*3/uL (ref 4.0–10.5)
nRBC: 0 % (ref 0.0–0.2)

## 2021-06-28 LAB — CMP (CANCER CENTER ONLY)
ALT: 20 U/L (ref 0–44)
AST: 18 U/L (ref 15–41)
Albumin: 3.4 g/dL — ABNORMAL LOW (ref 3.5–5.0)
Alkaline Phosphatase: 82 U/L (ref 38–126)
Anion gap: 13 (ref 5–15)
BUN: 13 mg/dL (ref 8–23)
CO2: 19 mmol/L — ABNORMAL LOW (ref 22–32)
Calcium: 8.5 mg/dL — ABNORMAL LOW (ref 8.9–10.3)
Chloride: 110 mmol/L (ref 98–111)
Creatinine: 1.31 mg/dL — ABNORMAL HIGH (ref 0.44–1.00)
GFR, Estimated: 42 mL/min — ABNORMAL LOW (ref >60)
Glucose, Bld: 170 mg/dL — ABNORMAL HIGH (ref 70–99)
Potassium: 3.9 mmol/L (ref 3.5–5.1)
Sodium: 142 mmol/L (ref 135–145)
Total Bilirubin: 0.4 mg/dL (ref 0.3–1.2)
Total Protein: 6 g/dL — ABNORMAL LOW (ref 6.5–8.1)

## 2021-06-28 MED ORDER — DIPHENHYDRAMINE HCL 25 MG PO CAPS
50.0000 mg | ORAL_CAPSULE | Freq: Once | ORAL | Status: AC
  Administered 2021-06-28: 50 mg via ORAL
  Filled 2021-06-28: qty 2

## 2021-06-28 MED ORDER — SODIUM CHLORIDE 0.9 % IV SOLN
Freq: Once | INTRAVENOUS | Status: AC
Start: 2021-06-28 — End: 2021-06-28

## 2021-06-28 MED ORDER — ACETAMINOPHEN 325 MG PO TABS
650.0000 mg | ORAL_TABLET | Freq: Once | ORAL | Status: AC
  Administered 2021-06-28: 650 mg via ORAL
  Filled 2021-06-28: qty 2

## 2021-06-28 MED ORDER — SODIUM CHLORIDE 0.9% FLUSH
10.0000 mL | INTRAVENOUS | Status: DC | PRN
  Administered 2021-06-28: 10 mL

## 2021-06-28 MED ORDER — SODIUM CHLORIDE 0.9 % IV SOLN
20.0000 mg | Freq: Once | INTRAVENOUS | Status: AC
  Administered 2021-06-28: 20 mg via INTRAVENOUS
  Filled 2021-06-28: qty 20

## 2021-06-28 MED ORDER — SODIUM CHLORIDE 0.9% FLUSH
10.0000 mL | INTRAVENOUS | Status: AC | PRN
  Administered 2021-06-28: 10 mL

## 2021-06-28 MED ORDER — SODIUM CHLORIDE 0.9 % IV SOLN
16.0000 mg/kg | Freq: Once | INTRAVENOUS | Status: AC
  Administered 2021-06-28: 1400 mg via INTRAVENOUS
  Filled 2021-06-28: qty 10

## 2021-06-28 MED ORDER — FAMOTIDINE 20 MG IN NS 100 ML IVPB
20.0000 mg | Freq: Once | INTRAVENOUS | Status: AC
  Administered 2021-06-28: 20 mg via INTRAVENOUS
  Filled 2021-06-28: qty 100

## 2021-06-28 MED ORDER — SODIUM CHLORIDE 0.9 % IV SOLN: Freq: Once | INTRAVENOUS | Status: DC

## 2021-06-28 MED ORDER — CYANOCOBALAMIN 1000 MCG/ML IJ SOLN
1000.0000 ug | Freq: Once | INTRAMUSCULAR | Status: AC
  Administered 2021-06-28: 1000 ug via INTRAMUSCULAR
  Filled 2021-06-28: qty 1

## 2021-06-28 MED ORDER — HEPARIN SOD (PORK) LOCK FLUSH 100 UNIT/ML IV SOLN
250.0000 [IU] | Freq: Once | INTRAVENOUS | Status: AC | PRN
  Administered 2021-06-28: 250 [IU]

## 2021-06-28 NOTE — Patient Instructions (Signed)
[facility] ONCOLOG[facility]  Discharge Instructions: Thank you for choosing [facility] to provide your oncology and hematology care.   If you have a lab appointment with the [facility], please go directly to the [facility] and check in at the registration area.   Wear comfortable clothing and clothing appropriate for easy access to any Portacath or PICC line.   We strive to give you quality time with your provider. You may need to reschedule your appointment if you arrive late (15 or more minutes).  Arriving late affects you and other patients whose appointments are after yours.  Also, if you miss three or more appointments without notifying the office, you may be dismissed from the clinic at the provider's discretion.      For prescription refill requests, have your pharmacy contact our office and allow 72 hours for refills to be completed.    Today you received the following chemotherapy and/or immunotherapy agent: Daratumumab (Darzalex).   To help prevent nausea and vomiting after your treatment, we encourage you to take your nausea medication as directed.  BELOW ARE SYMPTOMS THAT SHOULD BE REPORTED IMMEDIATELY: *FEVER GREATER THAN 100.4 F (38 C) OR HIGHER *CHILLS OR SWEATING *NAUSEA AND VOMITING THAT IS NOT CONTROLLED WITH YOUR NAUSEA MEDICATION *UNUSUAL SHORTNESS OF BREATH *UNUSUAL BRUISING OR BLEEDING *URINARY PROBLEMS (pain or burning when urinating, or frequent urination) *BOWEL PROBLEMS (unusual diarrhea, constipation, pain near the anus) TENDERNESS IN MOUTH AND THROAT WITH OR WITHOUT PRESENCE OF ULCERS (sore throat, sores in mouth, or a toothache) UNUSUAL RASH, SWELLING OR PAIN  UNUSUAL VAGINAL DISCHARGE OR ITCHING   Items with * indicate a potential emergency and should be followed up as soon as possible or go to the Emergency Department if any problems should occur.  Please show the CHEMOTHERAPY ALERT CARD or IMMUNOTHERAPY ALERT CARD at  check-in to the Emergency Department and triage nurse.  Should you have questions after your visit or need to cancel or reschedule your appointment, please contact [facility]  Dept: [phone]  and follow the prompts.  Office hours are 8:00 a.m. to 4:30 p.m. Monday - Friday. Please note that voicemails left after 4:00 p.m. may not be returned until the following business day.  We are closed weekends and major holidays. You have access to a nurse at all times for urgent questions. Please call the main number to the clinic Dept: [phone] and follow the prompts.   For any non-urgent questions, you may also contact your provider using MyChart. We now offer e-Visits for anyone 72 and older to request care online for non-urgent symptoms. For details visit [email or web address].   Also download the MyChart app! Go to the app store, search "MyChart", open the app, select [facility], and log in with your MyChart username and password.  Due to Covid, a mask is required upon entering the hospital/clinic. If you do not have a mask, one will be given to you upon arrival. For doctor visits, patients may have 1 support person aged 88 or older with them. For treatment visits, patients cannot have anyone with them due to current Covid guidelines and our immunocompromised population.

## 2021-06-28 NOTE — Progress Notes (Signed)
Per Dr. Irene Limbo, hold Aredia for today.

## 2021-07-02 LAB — MULTIPLE MYELOMA PANEL, SERUM
Albumin SerPl Elph-Mcnc: 3.1 g/dL (ref 2.9–4.4)
Albumin/Glob SerPl: 1.2 (ref 0.7–1.7)
Alpha 1: 0.2 g/dL (ref 0.0–0.4)
Alpha2 Glob SerPl Elph-Mcnc: 1.3 g/dL — ABNORMAL HIGH (ref 0.4–1.0)
B-Globulin SerPl Elph-Mcnc: 0.8 g/dL (ref 0.7–1.3)
Gamma Glob SerPl Elph-Mcnc: 0.4 g/dL (ref 0.4–1.8)
Globulin, Total: 2.6 g/dL (ref 2.2–3.9)
IgA: 66 mg/dL (ref 64–422)
IgG (Immunoglobin G), Serum: 392 mg/dL — ABNORMAL LOW (ref 586–1602)
IgM (Immunoglobulin M), Srm: 36 mg/dL (ref 26–217)
Total Protein ELP: 5.7 g/dL — ABNORMAL LOW (ref 6.0–8.5)

## 2021-07-04 ENCOUNTER — Encounter: Payer: Self-pay | Admitting: Hematology

## 2021-07-04 NOTE — Progress Notes (Addendum)
[facility] up:   DOS .06/28/2021  Tracy Minium, MD [address] [place] [address]   CC: Continued evaluation and management of multiple myeloma ,iron deficiency and B12 deficiency.    SUMMARY OF ONCOLOGIC HISTORY: Oncology History  History of right breast cancer  04/16/2000 Surgery   Left breast: Triple negative  invasive ductal cancer treated with lumpectomy, adjuvant chemotherapy, radiation , in [place], unknown stage   06/07/2015 Mammogram   Right breast mass 6x 6 x 5 mm, right axillary lymph node with slight cortex thickening measured 5 mm    06/13/2015 Initial Diagnosis   Right breast needle biopsy: Invasive ductal carcinoma, grade 1, right axillary lymph node biopsy negative , ER 95%, PR 5%, Ki-67 10%, HER-2 negative   06/13/2015 Clinical Stage   Stage IA: T1b N0   07/07/2015 Surgery   Right lumpectomy: Invasive ductal carcinoma grade 1, 1 cm span, with low-grade DCIS, DCIS focally 0.1 cm to inferior margin, 0/3 lymph nodes negative, ER 95%, PR 5%, HER-2 negative ratio 1.1, Ki-67 10%   07/07/2015 Pathologic Stage   Stage IA: T1c N0   07/13/2015 Procedure   Breast High/Moderate Risk Panel reveals no clinically significant variant at ATM, BRCA1, BRCA2, CDH1, CHEK2, PALB2, PTEN, and TP53.     08/23/2015 - 09/21/2015 Radiation Therapy   Adjuvant Radiation: Right breast/ 42.5Gy at 2.5 Gy per fraction x 17 fractions.   Right breast boost/ 7.5 Gy at 2.5 Gy per fraction x 3 fractions    Anti-estrogen oral therapy   Patient refused antiestrogen therapy   10/20/2015 Survivorship   Survivorship care plan completed and mailed to patient in lieu of in person visit at her request   Iron deficiency anemia  Multiple myeloma ([place])  01/25/2020 Initial Diagnosis   Multiple myeloma ([place])   02/11/2020 - 05/19/2020 Adjuvant Chemotherapy   Daratumumab Weekly    02/29/2020 - 03/13/2020 Radiation Therapy   The targets were treated to a total  dose of 20 Gy in 10 fractions of 2 Gy each to the left hip and L5 using one plan.   06/02/2020 - 10/05/2020 Adjuvant Chemotherapy   Daratumumab and Carfilzomib   10/19/2020 -  Adjuvant Chemotherapy   Maintenance Daratumumab--initially every 2 weeks, then every 4 weeks.    Bone metastases ([place])  02/08/2021 Initial Diagnosis   Bone metastases ([facility])     CURRENT THERAPY: Daratumumab/B12 injection Pamidronate (on hold for dental procedure)  INTERVAL HISTORY:  Tracy [name] is here for her scheduled follow-up for continued evaluation and management of multiple myeloma.  Patient was last seen in our clinic on 05/31/2021. She notes that she had some right flank pain which was evaluated by her primary care physician but has now resolved.  She notes that they ruled out any acute intra-abdominal pathology and no kidney stones were noted. Patient notes no fevers no chills no night sweats.  No unexpected weight loss. Denies any overt GI bleeding. No significant toxicities from her last daratumumab treatment.  Labs done today were reviewed in details. No other acute new symptoms.  Patient Active Problem List   Diagnosis Date Noted   Bone metastases ([place]) 02/08/2021   Angiodysplasia of intestine 08/23/2020   Port-A-Cath in place 08/04/2020   Counseling regarding advance care planning and goals of care 02/07/2020   Multiple myeloma ([place]) 01/25/2020   Dizziness 10/12/2019   Post-nasal drainage 10/12/2019   Allergic rhinitis 08/19/2018   Type 2 diabetes mellitus with  diabetic neuropathy, unspecified ([place]) 11/05/2017   Encounter for long-term use of muscle relaxants 09/24/2017   CAD in native artery 09/24/2017   OSA (obstructive sleep apnea) 09/24/2017   Snorings 09/24/2017   Asthenia 06/30/2017   Morbid obesity ([place]) 06/02/2017   Depression 06/02/2017   Iron deficiency anemia 09/23/2016   Anemia of chronic disease 09/28/2015   Genetic testing 08/21/2015   History of left breast cancer  07/13/2015   History of right breast cancer 06/20/2015   Hearing loss due to cerumen impaction 12/29/2014   Allergy to adhesive tape 05/19/2014   Hyperlipidemia 12/06/2013   GERD (gastroesophageal reflux disease) 12/06/2013   Cervical disc disease 11/11/2013   Osteopenia 05/25/2013   Allergic asthma 12/24/2012   Insomnia 04/02/2012   Physical exam, annual 04/02/2012   HTN (hypertension) 02/03/2012   Vertigo, benign positional 02/03/2012   Left groin pain 02/03/2012   Hip pain 10/10/2011   TIA (transient ischemic attack) 07/24/2011   Allergic reaction 07/05/2011   Osteoarthrosis involving lower leg 10/19/2010   Disorder of bone and cartilage 05/01/2010   Generalized anxiety disorder 05/01/2010   Vitamin D deficiency 02/20/2010   Unspecified chronic bronchitis ([place]) 08/24/2009   Other lymphedema 10/29/2007    is allergic to bacitracin-neomycin-polymyxin  [neomycin-bacitracin zn-polymyx], nsaids, tape, ambien [zolpidem tartrate], amoxicillin, clavulanic acid, contrast media [iodinated diagnostic agents], latex, and prednisone.  MEDICAL HISTORY: Past Medical History:  Diagnosis Date   Angiodysplasia of intestine 08/23/2020   Anxiety    Arthritis    Breast cancer ([place]) 06/13/15   Cancer ([place]) 2000   breast cancer   Chronic bronchitis ([place])    Chronic bronchitis ([place])    Hyperlipidemia    Hypertension    Myocardial infarction [facility]) 2001   Personal history of radiation therapy    Restless leg    Stroke ([place]) 2004   TIA, no deficits    SURGICAL HISTORY: Past Surgical History:  Procedure Laterality Date   ABDOMINAL HYSTERECTOMY  1985   BREAST LUMPECTOMY Left 2000   radiation and chemo   BREAST LUMPECTOMY Right 2016   radiation   BREAST SURGERY  2001   lt breast lumpectomy   COLONOSCOPY WITH ESOPHAGOGASTRODUODENOSCOPY (EGD)  04/2020   ENTEROSCOPY N/A 03/15/2021   Procedure: ENTEROSCOPY;  Surgeon: Milus Banister, MD;  Location: [facility];  Service: Endoscopy;   Laterality: N/A;   GIVENS CAPSULE STUDY  07/2020   HOT HEMOSTASIS N/A 03/15/2021   Procedure: HOT HEMOSTASIS (ARGON PLASMA COAGULATION/BICAP);  Surgeon: Milus Banister, MD;  Location: [facility];  Service: Endoscopy;  Laterality: N/A;   IR IMAGING GUIDED PORT INSERTION  04/25/2020   RADIOACTIVE SEED GUIDED PARTIAL MASTECTOMY WITH AXILLARY SENTINEL LYMPH NODE BIOPSY Right 07/07/2015   Procedure: RIGHT RADIOACTIVE SEED GUIDED PARTIAL MASTECTOMY WITH AXILLARY SENTINEL LYMPH NODE BIOPSY;  Surgeon: Autumn Messing III, MD;  Location: [facility];  Service: General;  Laterality: Right;   SMALL INTESTINE SURGERY     TUBAL LIGATION     UPPER GASTROINTESTINAL ENDOSCOPY      SOCIAL HISTORY: Social History   Socioeconomic History   Marital status: Divorced    Spouse name: Not on file   Number of children: 7   Years of education: Not on file   Highest education level: Not on file  Occupational History   Occupation: retired  Tobacco Use   Smoking status: Former    Packs/day: 1.00    Years: 20.00    Pack years: 20.00    Types: Cigarettes  Quit date: 07/30/2011    Years since quitting: 9.9   Smokeless tobacco: Never   Tobacco comments:    Quit >4 years ago; 1 ppd for about 5/20 years (remaining was less)  Vaping Use   Vaping Use: Former  Substance and Sexual Activity   Alcohol use: No    Alcohol/week: 0.0 standard drinks   Drug use: No   Sexual activity: Not Currently  Other Topics Concern   Not on file  Social History Narrative   Lives alone.  Retired.  Education:  11th grade GED.  Children:  7 (one here).    Social Determinants of Health   Financial Resource Strain: Low Risk    Difficulty of Paying Living Expenses: Not hard at all  Food Insecurity: Not on file  Transportation Needs: Not on file  Physical Activity: Inactive   Days of Exercise per Week: 0 days   Minutes of Exercise per Session: 0 min  Stress: No Stress Concern Present   Feeling of Stress : Not at  all  Social Connections: Moderately Isolated   Frequency of Communication with Friends and Family: More than three times a week   Frequency of Social Gatherings with Friends and Family: Once a week   Attends Religious Services: 1 to 4 times per year   Active Member of [employer] or Organizations: No   Attends Music therapist: Never   Marital Status: Divorced  Human resources officer Violence: Not At Risk   Fear of Current or Ex-Partner: No   Emotionally Abused: No   Physically Abused: No   Sexually Abused: No    FAMILY HISTORY: Family History  Problem Relation Age of Onset   Emphysema Mother 70       smoker   Diabetes Father    Lung cancer Sister        dx. <50; former smoker   Diabetes Brother    Diabetes Brother    Brain cancer Brother 2       unknown tumor type   Diabetes Paternal Aunt    Stroke Maternal Grandmother    Diabetes Paternal Grandmother    Cancer Daughter 8       neck cancer   Other Daughter        hysterectomy for unspecified reason   Colon cancer Daughter    Breast cancer Cousin    Cancer Cousin        unspecified type   Breast cancer Other        triple negative breast cancer in her 33s   Colon polyps Neg Hx    Esophageal cancer Neg Hx    Gallbladder disease Neg Hx    10 Point review of Systems was done is negative except as noted above.    PHYSICAL EXAMINATION  ECOG PERFORMANCE STATUS: 1 - Symptomatic but completely ambulatory  Vitals:   06/28/21 1030  BP: (!) 172/84  Pulse: 81  Resp: 20  Temp: (!) 97.2 F (36.2 C)  SpO2: 100%   . GENERAL:alert, in no acute distress and comfortable SKIN: no acute rashes, no significant lesions EYES: conjunctiva are pink and non-injected, sclera anicteric OROPHARYNX: MMM, no exudates, no oropharyngeal erythema or ulceration NECK: supple, no JVD LYMPH:  no palpable lymphadenopathy in the cervical, axillary or inguinal regions LUNGS: clear to auscultation b/l with normal respiratory  effort HEART: regular rate & rhythm ABDOMEN:  normoactive bowel sounds , non tender, not distended. Extremity: no pedal edema PSYCH: alert & oriented x 3 with fluent speech  NEURO: no focal motor/sensory deficits    LABORATORY DATA: . CBC Latest Ref Rng & Units 06/28/2021 05/31/2021 05/03/2021  WBC 4.0 - 10.5 K/uL 5.3 4.1 4.5  Hemoglobin 12.0 - 15.0 g/dL 9.5(L) 10.2(L) 10.0(L)  Hematocrit 36.0 - 46.0 % 28.5(L) 30.7(L) 30.4(L)  Platelets 150 - 400 K/uL 277 287 288   . CMP Latest Ref Rng & Units 06/28/2021 05/31/2021 05/03/2021  Glucose 70 - 99 mg/dL 170(H) 128(H) 121(H)  BUN 8 - 23 mg/dL '13 11 13  ' Creatinine 0.44 - 1.00 mg/dL 1.31(H) 1.43(H) 1.69(H)  Sodium 135 - 145 mmol/L 142 144 141  Potassium 3.5 - 5.1 mmol/L 3.9 4.4 4.6  Chloride 98 - 111 mmol/L 110 111 112(H)  CO2 22 - 32 mmol/L 19(L) 21(L) 19(L)  Calcium 8.9 - 10.3 mg/dL 8.5(L) 9.2 9.0  Total Protein 6.5 - 8.1 g/dL 6.0(L) 6.6 6.7  Total Bilirubin 0.3 - 1.2 mg/dL 0.4 0.2(L) 0.3  Alkaline Phos 38 - 126 U/L 82 82 75  AST 15 - 41 U/L '18 19 21  ' ALT 0 - 44 U/L '20 16 13      ' ASSESSMENT and THERAPY PLAN:   75 yo here for follow-up of her for follow-up of her multiple myeloma   1)  IgG Kappa Multiple myeloma with bone lesions, anemia, renal insuff. M spike @ 3.7g/dl on diagnosis. 1p deletion, polymorphic variant, 13q deletion Multiple myeloma panel from 06/28/2021 with no M spike.  IFE positive for IgG kappa monoclonal protein possibly from her daratumumab. Currently on monthly daratumumab Pamidronate is on hold due to dental extraction in October 2022 2) h/o Dm2 3) Diabetic Neuropathy 4) CKD - likely from DM2, but could have an element of myeloma kidney. 5) h/o TIA and AMI 6) Iron deficiency 7) B12 deficiency   PLAN: -Discussed pt labwork today 06/28/2021 CBC with mild anemia hemoglobin of 9.5, MCV of 93.4, WBC count 5.3, platelets 277k CMP creatinine 1.31, chronic CKD otherwise unremarkable. Multiple myeloma panel from  06/28/2021 with no M spike.  IFE positive for IgG kappa monoclonal protein possibly from her daratumumab. -Patient has no clinical symptoms or lab findings suggestive of multiple myeloma progression at this time. -Patient has no primary toxicities from her daratumumab.  Continue daratumumab monthly. -Pamidronate was held due to her dental extraction in October and will be restarted about 2 months after her dental extraction if there are no issues and will be continued every 8 weeks. -Continue B12 1000 mcg every 4 weeks for B12 deficiency. -Monitor for GI bleeding with primary care physician and GI. -Daratumumab orders reviewed and signed after toxicity check.  We will continue same supportive medications. -Continue close follow-up with primary care physician for management of other medical comorbidities including diabetes diabetic neuropathy and her chronic kidney disease.  Follow-up Follow-up as per her neck schedule appointment on 07/26/2021.  . The total time spent in the appointment was 32 minutes spent on lab review, toxicity assessment from chemotherapy, ordering and management of daratumumab treatment, B12 injections and bisphosphonate therapy.   [name] MD MS Hematology/Oncology Physician [facility]

## 2021-07-07 ENCOUNTER — Encounter: Payer: Self-pay | Admitting: Hematology

## 2021-07-07 NOTE — Addendum Note (Signed)
Addended by: [name] on: 07/07/2021 02:13 PM   Modules accepted: Orders

## 2021-07-19 ENCOUNTER — Other Ambulatory Visit: Payer: Self-pay | Admitting: Family Medicine

## 2021-07-19 ENCOUNTER — Other Ambulatory Visit: Payer: Self-pay

## 2021-07-19 MED ORDER — TIZANIDINE HCL 4 MG PO TABS: ORAL_TABLET | ORAL | 1 refills | Status: DC

## 2021-07-19 NOTE — Telephone Encounter (Signed)
Pt called asking if we could send this in today, she is almost out and the mail order rep told her she would make sure she got the medication soon.   Please advise

## 2021-07-25 ENCOUNTER — Other Ambulatory Visit: Payer: Self-pay | Admitting: *Deleted

## 2021-07-25 MED FILL — Dexamethasone Sodium Phosphate Inj 100 MG/10ML: INTRAMUSCULAR | Qty: 2 | Status: AC

## 2021-07-26 ENCOUNTER — Ambulatory Visit: Payer: Medicare Other | Admitting: Physician Assistant

## 2021-07-26 ENCOUNTER — Ambulatory Visit: Payer: Medicare Other | Admitting: Adult Health

## 2021-07-26 ENCOUNTER — Other Ambulatory Visit: Payer: Self-pay

## 2021-07-26 ENCOUNTER — Ambulatory Visit: Payer: Medicare Other

## 2021-07-26 ENCOUNTER — Inpatient Hospital Stay: Payer: Medicare Other

## 2021-07-26 ENCOUNTER — Other Ambulatory Visit: Payer: Medicare Other

## 2021-07-26 ENCOUNTER — Inpatient Hospital Stay: Payer: Medicare Other | Admitting: Hematology

## 2021-08-03 ENCOUNTER — Telehealth: Payer: Self-pay | Admitting: Hematology

## 2021-08-03 NOTE — Telephone Encounter (Signed)
Sch per 1/5 inbasket, left msg

## 2021-08-07 ENCOUNTER — Inpatient Hospital Stay: Payer: Medicare Other

## 2021-08-07 ENCOUNTER — Inpatient Hospital Stay: Payer: Medicare Other | Admitting: Hematology

## 2021-08-07 ENCOUNTER — Ambulatory Visit: Payer: Medicare Other | Admitting: Hematology

## 2021-08-07 ENCOUNTER — Telehealth: Payer: Self-pay | Admitting: Family Medicine

## 2021-08-07 NOTE — Telephone Encounter (Signed)
Called pt no answer, LM

## 2021-08-07 NOTE — Progress Notes (Signed)
Attempted to contact pt regarding missed appointment from today. Left message and requested call back.

## 2021-08-07 NOTE — Telephone Encounter (Signed)
Unfortunately I cannot call in anything w/o an appt.  If she is having trouble w/ cough, I recommend Mucinex DM or Delsym.  If she is having trouble w/ congestion, I recommend Coricidin HBP (to prevent her blood pressure from climbing).  Lots of fluids, lots of rest, and if things worsen she needs to visit an Urgent Care or [facility] for evaluation.

## 2021-08-07 NOTE — Telephone Encounter (Signed)
Pt is in [place], reports issues with bronchitis ans has requested you send in something for her without an appointment or if you cannot prescribe can you recommend something OTC?

## 2021-08-07 NOTE — Telephone Encounter (Signed)
Pt called in stating that her Bronchitis is acting up again. She is out of town and wanted to know if Dr. Birdie Riddle would be willing to call her in some medication for her.   I advised pt that normally we have to make an appointment to be evaluated. She asked that I just ask, she states that if she can't she wanted to know what she could get over the counter.   Pt would like the medication to go to  [facility] on [address] [place] [address]  Phone # [phone]   Pt can be reached at [phone]

## 2021-08-08 ENCOUNTER — Telehealth: Payer: Self-pay | Admitting: Hematology

## 2021-08-08 NOTE — Telephone Encounter (Signed)
Sch per 1/10 inbasket, left msg with pt daughter

## 2021-08-08 NOTE — Telephone Encounter (Signed)
Sch per 1/10 inbasket ,left msg

## 2021-08-10 ENCOUNTER — Other Ambulatory Visit: Payer: Self-pay | Admitting: Family Medicine

## 2021-08-13 ENCOUNTER — Encounter: Payer: Self-pay | Admitting: Registered Nurse

## 2021-08-13 ENCOUNTER — Other Ambulatory Visit: Payer: Self-pay

## 2021-08-13 ENCOUNTER — Ambulatory Visit (INDEPENDENT_AMBULATORY_CARE_PROVIDER_SITE_OTHER): Payer: Medicare Other | Admitting: Registered Nurse

## 2021-08-13 VITALS — BP 140/70 | HR 73 | Temp 98.3°F | Resp 16 | Wt 181.4 lb

## 2021-08-13 DIAGNOSIS — J069 Acute upper respiratory infection, unspecified: Secondary | ICD-10-CM | POA: Diagnosis not present

## 2021-08-13 MED ORDER — OXYMETAZOLINE HCL 0.05 % NA SOLN: 1.0000 | Freq: Two times a day (BID) | NASAL | 0 refills | Status: DC | PRN

## 2021-08-13 MED ORDER — BENZONATATE 100 MG PO CAPS
100.0000 mg | ORAL_CAPSULE | Freq: Three times a day (TID) | ORAL | 0 refills | Status: DC | PRN
Start: 2021-08-13 — End: 2021-08-22

## 2021-08-13 MED ORDER — DOXYCYCLINE HYCLATE 100 MG PO TABS: 100.0000 mg | ORAL_TABLET | Freq: Two times a day (BID) | ORAL | 0 refills | Status: DC

## 2021-08-13 MED ORDER — ATORVASTATIN CALCIUM 10 MG PO TABS: 10.0000 mg | ORAL_TABLET | Freq: Every day | ORAL | 1 refills | Status: DC

## 2021-08-13 MED ORDER — DM-GUAIFENESIN ER 30-600 MG PO TB12: 1.0000 | ORAL_TABLET | Freq: Two times a day (BID) | ORAL | 0 refills | Status: DC

## 2021-08-13 NOTE — Patient Instructions (Signed)
Ms. Jalon Blackwelder to see you!  Take doxycycline - finish the entire course even if you are feeling better.  Use the other medications as needed for relief.  Get plenty of rest and stay hydrated  Call if you're not improving by Friday morning.  Thank you,  Rich

## 2021-08-13 NOTE — Progress Notes (Signed)
Established Patient Office Visit  Subjective:  Patient ID: Tracy [name], female    DOB: 07-17-1946  Age: 76 y.o. MRN: [id number]  CC:  Chief Complaint  Patient presents with   Nasal Congestion   Cough   sinus drainage    HPI Tracy [name] presents for nasal congestion  Onset 1 week ago Pnd, cough Streak of blood in nasal mucus No shob, doe, chest pain No new nvd No lightheadedness, dizziness, chills, fever.   Currently followed by Dr. Irene Limbo for hx of breast ca, bone mets, MM  Past Medical History:  Diagnosis Date   Angiodysplasia of intestine 08/23/2020   Anxiety    Arthritis    Breast cancer ([place]) 06/13/15   Cancer ([place]) 2000   breast cancer   Chronic bronchitis ([place])    Chronic bronchitis ([place])    Hyperlipidemia    Hypertension    Myocardial infarction [facility]) 2001   Personal history of radiation therapy    Restless leg    Stroke ([place]) 2004   TIA, no deficits    Past Surgical History:  Procedure Laterality Date   ABDOMINAL HYSTERECTOMY  1985   BREAST LUMPECTOMY Left 2000   radiation and chemo   BREAST LUMPECTOMY Right 2016   radiation   BREAST SURGERY  2001   lt breast lumpectomy   COLONOSCOPY WITH ESOPHAGOGASTRODUODENOSCOPY (EGD)  04/2020   ENTEROSCOPY N/A 03/15/2021   Procedure: ENTEROSCOPY;  Surgeon: Milus Banister, MD;  Location: [facility] END[facility]COPY;  Service: Endoscopy;  Laterality: N/A;   GIVENS CAPSULE STUDY  07/2020   HOT HEMOSTASIS N/A 03/15/2021   Procedure: HOT HEMOSTASIS (ARGON PLASMA COAGULATION/BICAP);  Surgeon: Milus Banister, MD;  Location: [facility];  Service: Endoscopy;  Laterality: N/A;   IR IMAGING GUIDED PORT INSERTION  04/25/2020   RADIOACTIVE SEED GUIDED PARTIAL MASTECTOMY WITH AXILLARY SENTINEL LYMPH NODE BIOPSY Right 07/07/2015   Procedure: RIGHT RADIOACTIVE SEED GUIDED PARTIAL MASTECTOMY WITH AXILLARY SENTINEL LYMPH NODE BIOPSY;  Surgeon: Autumn Messing III, MD;  Location: [facility];  Service: General;  Laterality:  Right;   SMALL INTESTINE SURGERY     TUBAL LIGATION     UPPER GASTROINTESTINAL ENDOSCOPY      Family History  Problem Relation Age of Onset   Emphysema Mother 70       smoker   Diabetes Father    Lung cancer Sister        dx. <50; former smoker   Diabetes Brother    Diabetes Brother    Brain cancer Brother 30       unknown tumor type   Diabetes Paternal Aunt    Stroke Maternal Grandmother    Diabetes Paternal Grandmother    Cancer Daughter 8       neck cancer   Other Daughter        hysterectomy for unspecified reason   Colon cancer Daughter    Breast cancer Cousin    Cancer Cousin        unspecified type   Breast cancer Other        triple negative breast cancer in her 8s   Colon polyps Neg Hx    Esophageal cancer Neg Hx    Gallbladder disease Neg Hx     Social History   Socioeconomic History   Marital status: Divorced    Spouse name: Not on file   Number of children: 7   Years of education: Not on file   Highest education level: Not on  file  Occupational History   Occupation: retired  Tobacco Use   Smoking status: Former    Packs/day: 1.00    Years: 20.00    Pack years: 20.00    Types: Cigarettes    Quit date: 07/30/2011    Years since quitting: 10.0   Smokeless tobacco: Never   Tobacco comments:    Quit >4 years ago; 1 ppd for about 5/20 years (remaining was less)  Vaping Use   Vaping Use: Former  Substance and Sexual Activity   Alcohol use: No    Alcohol/week: 0.0 standard drinks   Drug use: No   Sexual activity: Not Currently  Other Topics Concern   Not on file  Social History Narrative   Lives alone.  Retired.  Education:  11th grade GED.  Children:  7 (one here).    Social Determinants of Health   Financial Resource Strain: Low Risk    Difficulty of Paying Living Expenses: Not hard at all  Food Insecurity: Not on file  Transportation Needs: Not on file  Physical Activity: Inactive   Days of Exercise per Week: 0 days   Minutes of  Exercise per Session: 0 min  Stress: No Stress Concern Present   Feeling of Stress : Not at all  Social Connections: Moderately Isolated   Frequency of Communication with Friends and Family: More than three times a week   Frequency of Social Gatherings with Friends and Family: Once a week   Attends Religious Services: 1 to 4 times per year   Active Member of [employer] or Organizations: No   Attends Archivist Meetings: Never   Marital Status: Divorced  Human resources officer Violence: Not At Risk   Fear of Current or Ex-Partner: No   Emotionally Abused: No   Physically Abused: No   Sexually Abused: No    Outpatient Medications Prior to Visit  Medication Sig Dispense Refill   Accu-Chek Softclix Lancets lancets Use as instructed to check sugars 1-2 times daily. 100 each 12   acyclovir (ZOVIRAX) 400 MG tablet TAKE 1 TABLET BY MOUTH  TWICE DAILY 180 tablet 1   albuterol (VENTOLIN HFA) 108 (90 Base) MCG/ACT inhaler Inhale 2 puffs into the lungs every 6 (six) hours as needed for wheezing or shortness of breath. 8 g 2   atorvastatin (LIPITOR) 10 MG tablet Take 1 tablet (10 mg total) by mouth daily. 90 tablet 1   azelastine (ASTELIN) 0.1 % nasal spray Place 1 spray into both nostrils 2 (two) times daily. Use in each nostril as directed 30 mL 12   B Complex-C (B-COMPLEX WITH VITAMIN C) tablet Take 1 tablet by mouth in the morning.     dexamethasone (DECADRON) 4 MG tablet Take 3 tabs with breakfast the day after each treatment. 21 tablet 1   dicyclomine (BENTYL) 10 MG capsule Take 1 capsule (10 mg total) by mouth 3 (three) times daily as needed for spasms. 45 capsule 0   glucose blood (ACCU-CHEK GUIDE) test strip Use as instructed to check sugars 1-2 times daily. 100 each 12   HYDROcodone-acetaminophen (NORCO/VICODIN) 5-325 MG tablet Take 1 tablet by mouth every 6 (six) hours as needed for moderate pain. 30 tablet 0   hydrOXYzine (VISTARIL) 25 MG capsule Take 1 capsule (25 mg total) by mouth every  8 (eight) hours as needed. 30 capsule 0   lidocaine-prilocaine (EMLA) cream APPLY 1 APPLICATION TOPICALLY AS NEEDED. 30 g 0   loratadine (CLARITIN) 10 MG tablet Take 10 mg by mouth  daily.     meclizine (ANTIVERT) 25 MG tablet Take 1 tablet (25 mg total) by mouth 3 (three) times daily as needed for dizziness. 30 tablet 0   metFORMIN (GLUCOPHAGE) 500 MG tablet TAKE 1 TABLET BY MOUTH  TWICE DAILY WITH A MEAL 180 tablet 3   mometasone (NASONEX) 50 MCG/ACT nasal spray Place 2 sprays into the nose daily. (Patient taking differently: Place 2 sprays into the nose daily as needed (allergies.).) 17 g 12   Multiple Vitamin (MULTIVITAMIN WITH MINERALS) TABS tablet Take 1 tablet by mouth daily. Centrum for Women 50+     pantoprazole (PROTONIX) 40 MG tablet Take 1 tablet (40 mg total) by mouth 2 (two) times daily. 180 tablet 0   potassium chloride SA (KLOR-CON) 20 MEQ tablet TAKE 1 TABLET BY MOUTH  TWICE DAILY 180 tablet 3   pregabalin (LYRICA) 50 MG capsule TAKE 1 CAPSULE(50 MG) BY MOUTH THREE TIMES DAILY 270 capsule 1   promethazine (PHENERGAN) 25 MG tablet Take 1/2 tablet in the morning then every 6 hours as needed for nausea 50 tablet 3   tiZANidine (ZANAFLEX) 4 MG tablet TAKE 1 TABLET(4 MG) BY MOUTH AT BEDTIME 90 tablet 1   traZODone (DESYREL) 100 MG tablet TAKE 1 TABLET BY MOUTH AT  BEDTIME 90 tablet 3   triamcinolone cream (KENALOG) 0.1 % Apply 1 application topically 2 (two) times daily. 30 g 0   Vitamin D, Ergocalciferol, (DRISDOL) 1.25 MG (50000 UNIT) CAPS capsule TAKE 1 CAPSULE BY MOUTH 1  TIME A WEEK 13 capsule 3   No facility-administered medications prior to visit.    Allergies  Allergen Reactions   Bacitracin-Neomycin-Polymyxin  [Neomycin-Bacitracin Zn-Polymyx] Swelling   Nsaids Other (See Comments)    nosebleeds   Tape Hives    Pt cannot tolerate bandaids, tape, or any other adhesives.    Ambien [Zolpidem Tartrate]     Hives    Amoxicillin     Rash    Clavulanic Acid Hives    Contrast Media [Iodinated Contrast Media] Hives    Pt states hives with prior ct, was given benadryl to resolve   Latex Swelling   Prednisone Swelling    Pt tolerates Dexamethasone. Throat swelling    ROS Review of Systems  Constitutional: Negative.   HENT:  Positive for congestion, postnasal drip and sore throat.   Eyes: Negative.   Respiratory:  Positive for cough. Negative for apnea, choking, chest tightness, shortness of breath, wheezing and stridor.   Cardiovascular: Negative.   Gastrointestinal: Negative.   Genitourinary: Negative.   Musculoskeletal: Negative.   Skin: Negative.   Neurological: Negative.   Psychiatric/Behavioral: Negative.    All other systems reviewed and are negative.    Objective:    Physical Exam Vitals and nursing note reviewed.  Constitutional:      General: She is not in acute distress.    Appearance: Normal appearance. She is normal weight. She is not ill-appearing, toxic-appearing or diaphoretic.  Cardiovascular:     Rate and Rhythm: Normal rate and regular rhythm.     Heart sounds: Normal heart sounds. No murmur heard.   No friction rub. No gallop.  Pulmonary:     Effort: Pulmonary effort is normal. No respiratory distress.     Breath sounds: Normal breath sounds. No stridor. No wheezing, rhonchi or rales.  Chest:     Chest wall: No tenderness.  Skin:    General: Skin is warm and dry.  Neurological:     General: No focal  deficit present.     Mental Status: She is alert and oriented to person, place, and time. Mental status is at baseline.  Psychiatric:        Mood and Affect: Mood normal.        Behavior: Behavior normal.        Thought Content: Thought content normal.        Judgment: Judgment normal.    BP 140/70    Pulse 73    Temp 98.3 F (36.8 C)    Resp 16    Wt 181 lb 6.4 oz (82.3 kg)    SpO2 97%    BMI 35.43 kg/m  Wt Readings from Last 3 Encounters:  08/13/21 181 lb 6.4 oz (82.3 kg)  06/28/21 178 lb 3.2 oz (80.8 kg)   06/07/21 172 lb 12.8 oz (78.4 kg)     Health Maintenance Due  Topic Date Due   Zoster Vaccines- Shingrix (1 of 2) Never done   OPHTHALMOLOGY EXAM  05/19/2020   COVID-19 Vaccine (4 - Booster for Moderna series) 07/31/2020   FOOT EXAM  08/19/2020    There are no preventive care reminders to display for this patient.  Lab Results  Component Value Date   TSH 2.23 04/10/2021   Lab Results  Component Value Date   WBC 5.3 06/28/2021   HGB 9.5 (L) 06/28/2021   HCT 28.5 (L) 06/28/2021   MCV 93.4 06/28/2021   PLT 277 06/28/2021   Lab Results  Component Value Date   NA 142 06/28/2021   K 3.9 06/28/2021   CHLORIDE 105 07/13/2015   CO2 19 (L) 06/28/2021   GLUCOSE 170 (H) 06/28/2021   BUN 13 06/28/2021   CREATININE 1.31 (H) 06/28/2021   BILITOT 0.4 06/28/2021   ALKPHOS 82 06/28/2021   AST 18 06/28/2021   ALT 20 06/28/2021   PROT 6.0 (L) 06/28/2021   ALBUMIN 3.4 (L) 06/28/2021   CALCIUM 8.5 (L) 06/28/2021   ANIONGAP 13 06/28/2021   EGFR 71 (L) 07/13/2015   GFR 33.25 (L) 04/10/2021   Lab Results  Component Value Date   CHOL 188 04/10/2021   Lab Results  Component Value Date   HDL 79.20 04/10/2021   Lab Results  Component Value Date   LDLCALC 75 04/10/2021   Lab Results  Component Value Date   TRIG 167.0 (H) 04/10/2021   Lab Results  Component Value Date   CHOLHDL 2 04/10/2021   Lab Results  Component Value Date   HGBA1C 7.1 (H) 04/10/2021      Assessment & Plan:   Problem List Items Addressed This Visit   None Visit Diagnoses     Acute upper respiratory infection    -  Primary   Relevant Medications   doxycycline (VIBRA-TABS) 100 MG tablet   dextromethorphan-guaiFENesin (MUCINEX DM) 30-600 MG 12hr tablet   oxymetazoline (AFRIN) 0.05 % nasal spray   benzonatate (TESSALON) 100 MG capsule       Meds ordered this encounter  Medications   doxycycline (VIBRA-TABS) 100 MG tablet    Sig: Take 1 tablet (100 mg total) by mouth 2 (two) times daily.     Dispense:  20 tablet    Refill:  0    Order Specific Question:   Supervising Provider    Answer:   Carlota Raspberry, JEFFREY R [[id number]]   dextromethorphan-guaiFENesin (MUCINEX DM) 30-600 MG 12hr tablet    Sig: Take 1 tablet by mouth 2 (two) times daily.    Dispense:  20 tablet  Refill:  0    Order Specific Question:   Supervising Provider    Answer:   Carlota Raspberry, JEFFREY R [[id number]]   oxymetazoline (AFRIN) 0.05 % nasal spray    Sig: Place 1 spray into both nostrils 2 (two) times daily as needed for congestion. Or bloody nose.    Dispense:  30 mL    Refill:  0    Order Specific Question:   Supervising Provider    Answer:   Carlota Raspberry, JEFFREY R [[id number]]   benzonatate (TESSALON) 100 MG capsule    Sig: Take 1 capsule (100 mg total) by mouth 3 (three) times daily as needed for cough.    Dispense:  20 capsule    Refill:  0    Order Specific Question:   Supervising Provider    Answer:   Carlota Raspberry, JEFFREY R [[id number]]    Follow-up: Return if symptoms worsen or fail to improve.   PLAN Given med hx, low threshold for treatment. Will give abx, prednisone, and supportive care as above Encourage her to rest and hydrate Given 7+ days of symptoms, will hold off on viral testing. Return if worsening or failing to improve Patient encouraged to call clinic with any questions, comments, or concerns.  [name], NP

## 2021-08-16 MED FILL — Dexamethasone Sodium Phosphate Inj 100 MG/10ML: INTRAMUSCULAR | Qty: 2 | Status: AC

## 2021-08-17 ENCOUNTER — Inpatient Hospital Stay: Payer: Medicare Other

## 2021-08-17 ENCOUNTER — Inpatient Hospital Stay: Payer: Medicare Other | Attending: Hematology

## 2021-08-17 ENCOUNTER — Inpatient Hospital Stay: Payer: Medicare Other | Admitting: Hematology

## 2021-08-17 ENCOUNTER — Other Ambulatory Visit: Payer: Self-pay

## 2021-08-17 ENCOUNTER — Other Ambulatory Visit: Payer: Self-pay | Admitting: Hematology

## 2021-08-17 VITALS — BP 178/63 | HR 72 | Temp 99.2°F | Resp 18

## 2021-08-17 VITALS — BP 177/77 | HR 72 | Temp 97.9°F | Resp 18 | Wt 177.6 lb

## 2021-08-17 DIAGNOSIS — Z79899 Other long term (current) drug therapy: Secondary | ICD-10-CM | POA: Insufficient documentation

## 2021-08-17 DIAGNOSIS — Z923 Personal history of irradiation: Secondary | ICD-10-CM | POA: Diagnosis not present

## 2021-08-17 DIAGNOSIS — C9000 Multiple myeloma not having achieved remission: Secondary | ICD-10-CM

## 2021-08-17 DIAGNOSIS — Z8673 Personal history of transient ischemic attack (TIA), and cerebral infarction without residual deficits: Secondary | ICD-10-CM | POA: Insufficient documentation

## 2021-08-17 DIAGNOSIS — Z87891 Personal history of nicotine dependence: Secondary | ICD-10-CM | POA: Insufficient documentation

## 2021-08-17 DIAGNOSIS — E1140 Type 2 diabetes mellitus with diabetic neuropathy, unspecified: Secondary | ICD-10-CM | POA: Diagnosis not present

## 2021-08-17 DIAGNOSIS — Z853 Personal history of malignant neoplasm of breast: Secondary | ICD-10-CM | POA: Insufficient documentation

## 2021-08-17 DIAGNOSIS — N189 Chronic kidney disease, unspecified: Secondary | ICD-10-CM | POA: Diagnosis not present

## 2021-08-17 DIAGNOSIS — E1122 Type 2 diabetes mellitus with diabetic chronic kidney disease: Secondary | ICD-10-CM | POA: Diagnosis not present

## 2021-08-17 DIAGNOSIS — Z5112 Encounter for antineoplastic immunotherapy: Secondary | ICD-10-CM

## 2021-08-17 DIAGNOSIS — E538 Deficiency of other specified B group vitamins: Secondary | ICD-10-CM | POA: Insufficient documentation

## 2021-08-17 DIAGNOSIS — I252 Old myocardial infarction: Secondary | ICD-10-CM | POA: Diagnosis not present

## 2021-08-17 DIAGNOSIS — Z7189 Other specified counseling: Secondary | ICD-10-CM

## 2021-08-17 DIAGNOSIS — D509 Iron deficiency anemia, unspecified: Secondary | ICD-10-CM | POA: Diagnosis not present

## 2021-08-17 DIAGNOSIS — C7951 Secondary malignant neoplasm of bone: Secondary | ICD-10-CM

## 2021-08-17 LAB — CMP (CANCER CENTER ONLY)
ALT: 11 U/L (ref 0–44)
AST: 15 U/L (ref 15–41)
Albumin: 4.2 g/dL (ref 3.5–5.0)
Alkaline Phosphatase: 73 U/L (ref 38–126)
Anion gap: 10 (ref 5–15)
BUN: 10 mg/dL (ref 8–23)
CO2: 25 mmol/L (ref 22–32)
Calcium: 9 mg/dL (ref 8.9–10.3)
Chloride: 108 mmol/L (ref 98–111)
Creatinine: 1.28 mg/dL — ABNORMAL HIGH (ref 0.44–1.00)
GFR, Estimated: 43 mL/min — ABNORMAL LOW (ref >60)
Glucose, Bld: 141 mg/dL — ABNORMAL HIGH (ref 70–99)
Potassium: 3.2 mmol/L — ABNORMAL LOW (ref 3.5–5.1)
Sodium: 143 mmol/L (ref 135–145)
Total Bilirubin: 0.4 mg/dL (ref 0.3–1.2)
Total Protein: 6.8 g/dL (ref 6.5–8.1)

## 2021-08-17 LAB — CBC WITH DIFFERENTIAL (CANCER CENTER ONLY)
Abs Immature Granulocytes: 0.02 10*3/uL (ref 0.00–0.07)
Basophils Absolute: 0 10*3/uL (ref 0.0–0.1)
Basophils Relative: 1 %
Eosinophils Absolute: 0.1 10*3/uL (ref 0.0–0.5)
Eosinophils Relative: 2 %
HCT: 33.5 % — ABNORMAL LOW (ref 36.0–46.0)
Hemoglobin: 11.1 g/dL — ABNORMAL LOW (ref 12.0–15.0)
Immature Granulocytes: 0 %
Lymphocytes Relative: 33 %
Lymphs Abs: 1.8 10*3/uL (ref 0.7–4.0)
MCH: 30.2 pg (ref 26.0–34.0)
MCHC: 33.1 g/dL (ref 30.0–36.0)
MCV: 91 fL (ref 80.0–100.0)
Monocytes Absolute: 0.4 10*3/uL (ref 0.1–1.0)
Monocytes Relative: 6 %
Neutro Abs: 3.2 10*3/uL (ref 1.7–7.7)
Neutrophils Relative %: 58 %
Platelet Count: 293 10*3/uL (ref 150–400)
RBC: 3.68 MIL/uL — ABNORMAL LOW (ref 3.87–5.11)
RDW: 13.2 % (ref 11.5–15.5)
WBC Count: 5.6 10*3/uL (ref 4.0–10.5)
nRBC: 0 % (ref 0.0–0.2)

## 2021-08-17 MED ORDER — HEPARIN SOD (PORK) LOCK FLUSH 100 UNIT/ML IV SOLN
500.0000 [IU] | Freq: Once | INTRAVENOUS | Status: AC | PRN
  Administered 2021-08-17: 500 [IU]

## 2021-08-17 MED ORDER — ACETAMINOPHEN 325 MG PO TABS
650.0000 mg | ORAL_TABLET | Freq: Once | ORAL | Status: AC
  Administered 2021-08-17: 650 mg via ORAL
  Filled 2021-08-17: qty 2

## 2021-08-17 MED ORDER — ALTEPLASE 2 MG IJ SOLR
2.0000 mg | Freq: Once | INTRAMUSCULAR | Status: AC | PRN
  Administered 2021-08-17: 2 mg
  Filled 2021-08-17: qty 2

## 2021-08-17 MED ORDER — FAMOTIDINE 20 MG IN NS 100 ML IVPB
20.0000 mg | Freq: Once | INTRAVENOUS | Status: AC
  Administered 2021-08-17: 20 mg via INTRAVENOUS
  Filled 2021-08-17: qty 100

## 2021-08-17 MED ORDER — SODIUM CHLORIDE 0.9 % IV SOLN
16.0000 mg/kg | Freq: Once | INTRAVENOUS | Status: AC
  Administered 2021-08-17: 1400 mg via INTRAVENOUS
  Filled 2021-08-17: qty 60

## 2021-08-17 MED ORDER — CYANOCOBALAMIN 1000 MCG/ML IJ SOLN
1000.0000 ug | Freq: Once | INTRAMUSCULAR | Status: AC
  Administered 2021-08-17: 1000 ug via SUBCUTANEOUS
  Filled 2021-08-17: qty 1

## 2021-08-17 MED ORDER — SODIUM CHLORIDE 0.9% FLUSH
10.0000 mL | INTRAVENOUS | Status: DC | PRN
  Administered 2021-08-17 (×2): 10 mL

## 2021-08-17 MED ORDER — SODIUM CHLORIDE 0.9 % IV SOLN: Freq: Once | INTRAVENOUS | Status: AC

## 2021-08-17 MED ORDER — DIPHENHYDRAMINE HCL 25 MG PO CAPS
50.0000 mg | ORAL_CAPSULE | Freq: Once | ORAL | Status: AC
  Administered 2021-08-17: 50 mg via ORAL
  Filled 2021-08-17: qty 2

## 2021-08-17 MED ORDER — SODIUM CHLORIDE 0.9 % IV SOLN
20.0000 mg | Freq: Once | INTRAVENOUS | Status: AC
  Administered 2021-08-17: 20 mg via INTRAVENOUS
  Filled 2021-08-17: qty 20
  Filled 2021-08-17: qty 2

## 2021-08-17 NOTE — Progress Notes (Signed)
Per Dr. Irene Limbo, okay for patient to proceed with treatment today with blood pressure 196/68. No additional orders given.

## 2021-08-17 NOTE — Patient Instructions (Signed)
[name]  Discharge Instructions: Thank you for choosing [facility] to provide your oncology and hematology care.   If you have a lab appointment with the [facility], please go directly to the [facility] and check in at the registration area.   Wear comfortable clothing and clothing appropriate for easy access to any Portacath or PICC line.   We strive to give you quality time with your provider. You may need to reschedule your appointment if you arrive late (15 or more minutes).  Arriving late affects you and other patients whose appointments are after yours.  Also, if you miss three or more appointments without notifying the office, you may be dismissed from the clinic at the providers discretion.      For prescription refill requests, have your pharmacy contact our office and allow 72 hours for refills to be completed.    Today you received the following chemotherapy and/or immunotherapy agent: Daratumumab (Darzalex).   To help prevent nausea and vomiting after your treatment, we encourage you to take your nausea medication as directed.  BELOW ARE SYMPTOMS THAT SHOULD BE REPORTED IMMEDIATELY: *FEVER GREATER THAN 100.4 F (38 C) OR HIGHER *CHILLS OR SWEATING *NAUSEA AND VOMITING THAT IS NOT CONTROLLED WITH YOUR NAUSEA MEDICATION *UNUSUAL SHORTNESS OF BREATH *UNUSUAL BRUISING OR BLEEDING *URINARY PROBLEMS (pain or burning when urinating, or frequent urination) *BOWEL PROBLEMS (unusual diarrhea, constipation, pain near the anus) TENDERNESS IN MOUTH AND THROAT WITH OR WITHOUT PRESENCE OF ULCERS (sore throat, sores in mouth, or a toothache) UNUSUAL RASH, SWELLING OR PAIN  UNUSUAL VAGINAL DISCHARGE OR ITCHING   Items with * indicate a potential emergency and should be followed up as soon as possible or go to the Emergency Department if any problems should occur.  Please show the CHEMOTHERAPY ALERT CARD or IMMUNOTHERAPY ALERT CARD at  check-in to the Emergency Department and triage nurse.  Should you have questions after your visit or need to cancel or reschedule your appointment, please contact [facility]  Dept: [phone]  and follow the prompts.  Office hours are 8:00 a.m. to 4:30 p.m. Monday - Friday. Please note that voicemails left after 4:00 p.m. may not be returned until the following business day.  We are closed weekends and major holidays. You have access to a nurse at all times for urgent questions. Please call the main number to the clinic Dept: [phone] and follow the prompts.   For any non-urgent questions, you may also contact your provider using MyChart. We now offer e-Visits for anyone 67 and older to request care online for non-urgent symptoms. For details visit [email or web address].   Also download the MyChart app! Go to the app store, search "MyChart", open the app, select [facility], and log in with your MyChart username and password.  Due to Covid, a mask is required upon entering the hospital/clinic. If you do not have a mask, one will be given to you upon arrival. For doctor visits, patients may have 1 support person aged 80 or older with them. For treatment visits, patients cannot have anyone with them due to current Covid guidelines and our immunocompromised population.

## 2021-08-21 ENCOUNTER — Telehealth: Payer: Self-pay | Admitting: Hematology

## 2021-08-21 NOTE — Telephone Encounter (Signed)
Scheduled follow-up appointments per 1/20 los. Patient is aware.

## 2021-08-22 ENCOUNTER — Other Ambulatory Visit: Payer: Self-pay | Admitting: Hematology

## 2021-08-22 ENCOUNTER — Ambulatory Visit (INDEPENDENT_AMBULATORY_CARE_PROVIDER_SITE_OTHER): Payer: Medicare Other | Admitting: Family Medicine

## 2021-08-22 ENCOUNTER — Encounter: Payer: Self-pay | Admitting: Family Medicine

## 2021-08-22 VITALS — BP 142/78 | HR 83 | Temp 98.5°F | Resp 18 | Ht 59.0 in | Wt 175.8 lb

## 2021-08-22 DIAGNOSIS — J069 Acute upper respiratory infection, unspecified: Secondary | ICD-10-CM | POA: Diagnosis not present

## 2021-08-22 LAB — MULTIPLE MYELOMA PANEL, SERUM
Albumin SerPl Elph-Mcnc: 3.6 g/dL (ref 2.9–4.4)
Albumin/Glob SerPl: 1.3 (ref 0.7–1.7)
Alpha 1: 0.2 g/dL (ref 0.0–0.4)
Alpha2 Glob SerPl Elph-Mcnc: 1.1 g/dL — ABNORMAL HIGH (ref 0.4–1.0)
B-Globulin SerPl Elph-Mcnc: 1 g/dL (ref 0.7–1.3)
Gamma Glob SerPl Elph-Mcnc: 0.4 g/dL (ref 0.4–1.8)
Globulin, Total: 2.8 g/dL (ref 2.2–3.9)
IgA: 82 mg/dL (ref 64–422)
IgG (Immunoglobin G), Serum: 463 mg/dL — ABNORMAL LOW (ref 586–1602)
IgM (Immunoglobulin M), Srm: 51 mg/dL (ref 26–217)
Total Protein ELP: 6.4 g/dL (ref 6.0–8.5)

## 2021-08-22 NOTE — Progress Notes (Signed)
° °  Subjective:    Patient ID: Tracy [name], female    DOB: 10/23/1945, 76 y.o.   MRN: [id number]  HPI URI- pt was seen on 1/16 and tx'd w/ Doxycycline, Tessalon, and mucinex DM.  Pt stopped Doxycycline 2 days ago b/c she didn't think it was working.  Stopped the Tessalon b/c she felt it was making her mucous 'thicker'.  Pt states she is now in the 3rd week.  Doesn't feel that cough is in her chest or impacting breathing- 'it's in my throat'.  Yesterday developed R ear pain during a coughing fit.  Nasal congestion is clear.  No fever.  Pt reports she is not able to tolerate nasal steroids due to excessive drying.   Review of Systems For ROS see HPI   This visit occurred during the SARS-CoV-2 public health emergency.  Safety protocols were in place, including screening questions prior to the visit, additional usage of staff PPE, and extensive cleaning of exam room while observing appropriate contact time as indicated for disinfecting solutions.      Objective:   Physical Exam AAOx3, NAD NCAT PERRL, EOMI Coloring WNL TMs retracted bilaterally, R>L No TTP over frontal or maxillary sinuses + wet cough Lungs CTAB, no rhonci or wheezing RRR, normal S1/S2 Pulses WNL       Assessment & Plan:   Viral URI- pt is not showing signs of bacterial infection and does not require additional abx.  Suspect this is a combination of viral and allergic sxs.  Encouraged her to use the Azelastin daily, add nasal steroid for 3-5 days to help improve congestion w/o overdrying.  Continue Mucinex DM for cough/congestion.  Add Coricidin HBP for nasal congestion/eustachian tube dysfxn.  Reviewed supportive care and red flags that should prompt return.  Pt expressed understanding and is in agreement w/ plan.

## 2021-08-22 NOTE — Patient Instructions (Addendum)
Follow up as needed or as scheduled Thankfully this doesn't seem to be a bacterial infection CONTINUE the Azelastin nasal spray ADD Flonase or Nasonex for 3-5 days to help w/ congestion and open up your ear pressure CONTINUE Mucinex DM for cough ADD Coricidin HBP (a decongestant for people w/ elevated blood pressure) Drink plenty of fluids REST! Call with any questions or concerns Hang in there!!!

## 2021-08-23 ENCOUNTER — Ambulatory Visit: Payer: Medicare Other | Admitting: Hematology

## 2021-08-23 ENCOUNTER — Encounter: Payer: Self-pay | Admitting: Hematology

## 2021-08-23 ENCOUNTER — Other Ambulatory Visit: Payer: Medicare Other

## 2021-08-23 ENCOUNTER — Ambulatory Visit: Payer: Medicare Other

## 2021-08-23 NOTE — Progress Notes (Addendum)
[facility] Follow up:   DOS .08/17/2021  Tracy Minium, MD [address] [place] [address]   CC: Follow-up for continued evaluation and management of multiple myeloma    SUMMARY OF ONCOLOGIC HISTORY: Oncology History  History of right breast cancer  04/16/2000 Surgery   Left breast: Triple negative  invasive ductal cancer treated with lumpectomy, adjuvant chemotherapy, radiation , in [place], unknown stage   06/07/2015 Mammogram   Right breast mass 6x 6 x 5 mm, right axillary lymph node with slight cortex thickening measured 5 mm    06/13/2015 Initial Diagnosis   Right breast needle biopsy: Invasive ductal carcinoma, grade 1, right axillary lymph node biopsy negative , ER 95%, PR 5%, Ki-67 10%, HER-2 negative   06/13/2015 Clinical Stage   Stage IA: T1b N0   07/07/2015 Surgery   Right lumpectomy: Invasive ductal carcinoma grade 1, 1 cm span, with low-grade DCIS, DCIS focally 0.1 cm to inferior margin, 0/3 lymph nodes negative, ER 95%, PR 5%, HER-2 negative ratio 1.1, Ki-67 10%   07/07/2015 Pathologic Stage   Stage IA: T1c N0   07/13/2015 Procedure   Breast High/Moderate Risk Panel reveals no clinically significant variant at ATM, BRCA1, BRCA2, CDH1, CHEK2, PALB2, PTEN, and TP53.     08/23/2015 - 09/21/2015 Radiation Therapy   Adjuvant Radiation: Right breast/ 42.5Gy at 2.5 Gy per fraction x 17 fractions.   Right breast boost/ 7.5 Gy at 2.5 Gy per fraction x 3 fractions    Anti-estrogen oral therapy   Patient refused antiestrogen therapy   10/20/2015 Survivorship   Survivorship care plan completed and mailed to patient in lieu of in person visit at her request   Iron deficiency anemia  Multiple myeloma ([place])  01/25/2020 Initial Diagnosis   Multiple myeloma ([place])   02/11/2020 - 05/19/2020 Adjuvant Chemotherapy   Daratumumab Weekly    02/29/2020 - 03/13/2020 Radiation Therapy   The targets were treated to a total dose of 20 Gy in 10  fractions of 2 Gy each to the left hip and L5 using one plan.   06/02/2020 - 10/05/2020 Adjuvant Chemotherapy   Daratumumab and Carfilzomib   10/19/2020 -  Adjuvant Chemotherapy   Maintenance Daratumumab--initially every 2 weeks, then every 4 weeks.    Bone metastases ([place])  02/08/2021 Initial Diagnosis   Bone metastases ([facility])     CURRENT THERAPY: Daratumumab/B12 injection Pamidronate (on hold for dental procedure)  INTERVAL HISTORY:  Tracy [name] is here for continued evaluation and management of her multiple myeloma and prior to her next dose of daratumumab. She notes  that she did Tracy Coss, NP on 08/13/2021 for URI and was started on doxycycline.  She notes that her upper respiratory symptoms have resolved and she is keen to continue her treatment. No fevers no chills no night sweats no new bone pains. Has been eating and drinking well. No overt GI bleeding. Labs done today show stable CBC with an improvement in her hemoglobin to 11.1 up from 9.5, normal WBC count and platelets CMP stable other than potassium of 3.2 and creatinine of 1.28 Myeloma panel shows no visible M spike immunofixation pattern which is unremarkable with no monoclonal protein apparent.  .10 Point review of Systems was done is negative except as noted above.  Patient Active Problem List   Diagnosis Date Noted   Bone metastases ([employer]) 02/08/2021   Angiodysplasia of intestine 08/23/2020   Port-A-Cath in place 08/04/2020   Counseling regarding  advance care planning and goals of care 02/07/2020   Multiple myeloma ([facility]) 01/25/2020   Dizziness 10/12/2019   Post-nasal drainage 10/12/2019   Allergic rhinitis 08/19/2018   Type 2 diabetes mellitus with diabetic neuropathy, unspecified ([facility]) 11/05/2017   Encounter for long-term use of muscle relaxants 09/24/2017   CAD in native artery 09/24/2017   OSA (obstructive sleep apnea) 09/24/2017   [place] 09/24/2017   Asthenia 06/30/2017   Morbid obesity ([facility])  06/02/2017   Depression 06/02/2017   Iron deficiency anemia 09/23/2016   Anemia of chronic disease 09/28/2015   Genetic testing 08/21/2015   History of left breast cancer 07/13/2015   History of right breast cancer 06/20/2015   Hearing loss due to cerumen impaction 12/29/2014   Allergy to adhesive tape 05/19/2014   Hyperlipidemia 12/06/2013   GERD (gastroesophageal reflux disease) 12/06/2013   Cervical disc disease 11/11/2013   Osteopenia 05/25/2013   Allergic asthma 12/24/2012   Insomnia 04/02/2012   Physical exam, annual 04/02/2012   HTN (hypertension) 02/03/2012   Vertigo, benign positional 02/03/2012   Left groin pain 02/03/2012   Hip pain 10/10/2011   TIA (transient ischemic attack) 07/24/2011   Allergic reaction 07/05/2011   Osteoarthrosis involving lower leg 10/19/2010   Disorder of bone and cartilage 05/01/2010   Generalized anxiety disorder 05/01/2010   Vitamin D deficiency 02/20/2010   Unspecified chronic bronchitis ([place]) 08/24/2009   Other lymphedema 10/29/2007    is allergic to bacitracin-neomycin-polymyxin  [neomycin-bacitracin zn-polymyx], nsaids, tape, ambien [zolpidem tartrate], amoxicillin, clavulanic acid, contrast media [iodinated contrast media], latex, prednisone, and tessalon [benzonatate].  MEDICAL HISTORY: Past Medical History:  Diagnosis Date   Angiodysplasia of intestine 08/23/2020   Anxiety    Arthritis    Breast cancer ([facility]) 06/13/15   Cancer ([place]) 2000   breast cancer   Chronic bronchitis ([place])    Chronic bronchitis ([place])    Hyperlipidemia    Hypertension    Myocardial infarction [facility]) 2001   Personal history of radiation therapy    Restless leg    Stroke ([place]) 2004   TIA, no deficits    SURGICAL HISTORY: Past Surgical History:  Procedure Laterality Date   ABDOMINAL HYSTERECTOMY  1985   BREAST LUMPECTOMY Left 2000   radiation and chemo   BREAST LUMPECTOMY Right 2016   radiation   BREAST SURGERY  2001   lt breast lumpectomy    COLONOSCOPY WITH ESOPHAGOGASTRODUODENOSCOPY (EGD)  04/2020   ENTEROSCOPY N/A 03/15/2021   Procedure: ENTEROSCOPY;  Surgeon: Milus Banister, MD;  Location: [facility];  Service: Endoscopy;  Laterality: N/A;   GIVENS CAPSULE STUDY  07/2020   HOT HEMOSTASIS N/A 03/15/2021   Procedure: HOT HEMOSTASIS (ARGON PLASMA COAGULATION/BICAP);  Surgeon: Milus Banister, MD;  Location: [facility];  Service: Endoscopy;  Laterality: N/A;   IR IMAGING GUIDED PORT INSERTION  04/25/2020   RADIOACTIVE SEED GUIDED PARTIAL MASTECTOMY WITH AXILLARY SENTINEL LYMPH NODE BIOPSY Right 07/07/2015   Procedure: RIGHT RADIOACTIVE SEED GUIDED PARTIAL MASTECTOMY WITH AXILLARY SENTINEL LYMPH NODE BIOPSY;  Surgeon: Autumn Messing III, MD;  Location: [facility];  Service: General;  Laterality: Right;   SMALL INTESTINE SURGERY     TUBAL LIGATION     UPPER GASTROINTESTINAL ENDOSCOPY      SOCIAL HISTORY: Social History   Socioeconomic History   Marital status: Divorced    Spouse name: Not on file   Number of children: 7   Years of education: Not on file   Highest education level: Not on file  Occupational History   Occupation: retired  Tobacco Use   Smoking status: Former    Packs/day: 1.00    Years: 20.00    Pack years: 20.00    Types: Cigarettes    Quit date: 07/30/2011    Years since quitting: 10.0   Smokeless tobacco: Never   Tobacco comments:    Quit >4 years ago; 1 ppd for about 5/20 years (remaining was less)  Vaping Use   Vaping Use: Former  Substance and Sexual Activity   Alcohol use: No    Alcohol/week: 0.0 standard drinks   Drug use: No   Sexual activity: Not Currently  Other Topics Concern   Not on file  Social History Narrative   Lives alone.  Retired.  Education:  11th grade GED.  Children:  7 (one here).    Social Determinants of Health   Financial Resource Strain: Not on file  Food Insecurity: Not on file  Transportation Needs: Not on file  Physical Activity: Not on file   Stress: Not on file  Social Connections: Not on file  Intimate Partner Violence: Not on file    FAMILY HISTORY: Family History  Problem Relation Age of Onset   Emphysema Mother 7       smoker   Diabetes Father    Lung cancer Sister        dx. <50; former smoker   Diabetes Brother    Diabetes Brother    Brain cancer Brother 51       unknown tumor type   Diabetes Paternal Aunt    Stroke Maternal Grandmother    Diabetes Paternal Grandmother    Cancer Daughter 74       neck cancer   Other Daughter        hysterectomy for unspecified reason   Colon cancer Daughter    Breast cancer Cousin    Cancer Cousin        unspecified type   Breast cancer Other        triple negative breast cancer in her 57s   Colon polyps Neg Hx    Esophageal cancer Neg Hx    Gallbladder disease Neg Hx    PHYSICAL EXAMINATION  ECOG PERFORMANCE STATUS: 1 - Symptomatic but completely ambulatory  Vitals:   08/17/21 1100  BP: (!) 177/77  Pulse: 72  Resp: 18  Temp: 97.9 F (36.6 C)  SpO2: 100%  . GENERAL:alert, in no acute distress and comfortable SKIN: no acute rashes, no significant lesions EYES: conjunctiva are pink and non-injected, sclera anicteric OROPHARYNX: MMM, no exudates, no oropharyngeal erythema or ulceration NECK: supple, no JVD LYMPH:  no palpable lymphadenopathy in the cervical, axillary or inguinal regions LUNGS: clear to auscultation b/l with normal respiratory effort HEART: regular rate & rhythm ABDOMEN:  normoactive bowel sounds , non tender, not distended. Extremity: no pedal edema PSYCH: alert & oriented x 3 with fluent speech NEURO: no focal motor/sensory deficits   LABORATORY DATA: . CBC Latest Ref Rng & Units 08/17/2021 06/28/2021 05/31/2021  WBC 4.0 - 10.5 K/uL 5.6 5.3 4.1  Hemoglobin 12.0 - 15.0 g/dL 11.1(L) 9.5(L) 10.2(L)  Hematocrit 36.0 - 46.0 % 33.5(L) 28.5(L) 30.7(L)  Platelets 150 - 400 K/uL 293 277 287   . CMP Latest Ref Rng & Units 08/17/2021  06/28/2021 05/31/2021  Glucose 70 - 99 mg/dL 141(H) 170(H) 128(H)  BUN 8 - 23 mg/dL '10 13 11  ' Creatinine 0.44 - 1.00 mg/dL 1.28(H) 1.31(H) 1.43(H)  Sodium 135 - 145 mmol/L  143 142 144  Potassium 3.5 - 5.1 mmol/L 3.2(L) 3.9 4.4  Chloride 98 - 111 mmol/L 108 110 111  CO2 22 - 32 mmol/L 25 19(L) 21(L)  Calcium 8.9 - 10.3 mg/dL 9.0 8.5(L) 9.2  Total Protein 6.5 - 8.1 g/dL 6.8 6.0(L) 6.6  Total Bilirubin 0.3 - 1.2 mg/dL 0.4 0.4 0.2(L)  Alkaline Phos 38 - 126 U/L 73 82 82  AST 15 - 41 U/L '15 18 19  ' ALT 0 - 44 U/L '11 20 16   ' ASSESSMENT and THERAPY PLAN:   76 yo here for follow-up of her for follow-up of her multiple myeloma   1)  IgG Kappa Multiple myeloma with bone lesions, anemia, renal insuff. M spike @ 3.7g/dl on diagnosis. 1p deletion, polymorphic variant, 13q deletion Multiple myeloma panel from 06/28/2021 with no M spike.  IFE positive for IgG kappa monoclonal protein possibly from her daratumumab. Currently on monthly daratumumab Pamidronate is on hold due to dental extraction in October 2022 2) h/o DM2 3) Diabetic Neuropathy 4) CKD - likely from DM2, but could have an element of myeloma kidney. 5) h/o TIA and AMI 6) Iron deficiency 7) B12 deficiency   PLAN: -Discussed labs done today show stable CBC with an improvement in her hemoglobin to 11.1 up from 9.5, normal WBC count and platelets CMP stable other than potassium of 3.2 and creatinine of 1.28 Myeloma panel shows no visible M spike immunofixation pattern which is unremarkable with no monoclonal protein apparent.  Patient has no symptoms suggestive of myeloma progression at this time. Follow-up URI symptoms have resolved and she will complete her course of doxycycline and call us if any new symptoms of infection arise. No prohibitive toxicities from daratumumab and okay to proceed with her treatment as planned today. We shall continue her Aredia every 8 weeks. she was recommended to increase her p.o. potassium  intake. Continue B12 1000 mcg subcu every 4 weeks for B12 deficiency.  Follow-up Please schedule next 4 doses of daratumumab every 4 weeks with port flush and labs. Continue B12 subcutaneous injection every 4 weeks Continue Aredia every 8 weeks MD visit in 8 weeks    [name] MD MS Hematology/Oncology Physician [facility]

## 2021-08-27 ENCOUNTER — Other Ambulatory Visit: Payer: Self-pay | Admitting: Family Medicine

## 2021-08-27 NOTE — Telephone Encounter (Signed)
Pt called in stating that she is out of the pantoprazole please advise

## 2021-09-03 ENCOUNTER — Ambulatory Visit (INDEPENDENT_AMBULATORY_CARE_PROVIDER_SITE_OTHER): Payer: Medicare Other | Admitting: Family Medicine

## 2021-09-03 ENCOUNTER — Encounter: Payer: Self-pay | Admitting: Family Medicine

## 2021-09-03 VITALS — BP 130/70 | HR 82 | Temp 97.7°F | Resp 16 | Wt 179.6 lb

## 2021-09-03 DIAGNOSIS — G620 Drug-induced polyneuropathy: Secondary | ICD-10-CM | POA: Diagnosis not present

## 2021-09-03 DIAGNOSIS — T391X1A Poisoning by 4-Aminophenol derivatives, accidental (unintentional), initial encounter: Secondary | ICD-10-CM

## 2021-09-03 DIAGNOSIS — K644 Residual hemorrhoidal skin tags: Secondary | ICD-10-CM | POA: Diagnosis not present

## 2021-09-03 DIAGNOSIS — T451X5A Adverse effect of antineoplastic and immunosuppressive drugs, initial encounter: Secondary | ICD-10-CM | POA: Diagnosis not present

## 2021-09-03 DIAGNOSIS — G629 Polyneuropathy, unspecified: Secondary | ICD-10-CM | POA: Diagnosis not present

## 2021-09-03 DIAGNOSIS — D649 Anemia, unspecified: Secondary | ICD-10-CM | POA: Diagnosis not present

## 2021-09-03 LAB — TSH: TSH: 0.92 u[IU]/mL (ref 0.35–5.50)

## 2021-09-03 LAB — BASIC METABOLIC PANEL
BUN: 8 mg/dL (ref 6–23)
CO2: 31 mEq/L (ref 19–32)
Calcium: 8.8 mg/dL (ref 8.4–10.5)
Chloride: 104 mEq/L (ref 96–112)
Creatinine, Ser: 1.11 mg/dL (ref 0.40–1.20)
GFR: 47.97 mL/min — ABNORMAL LOW (ref >60.00)
Glucose, Bld: 120 mg/dL — ABNORMAL HIGH (ref 70–99)
Potassium: 3.4 mEq/L — ABNORMAL LOW (ref 3.5–5.1)
Sodium: 143 mEq/L (ref 135–145)

## 2021-09-03 LAB — HEPATIC FUNCTION PANEL
ALT: 14 U/L (ref 0–35)
AST: 18 U/L (ref 0–37)
Albumin: 4.2 g/dL (ref 3.5–5.2)
Alkaline Phosphatase: 71 U/L (ref 39–117)
Bilirubin, Direct: 0.1 mg/dL (ref 0.0–0.3)
Total Bilirubin: 0.5 mg/dL (ref 0.2–1.2)
Total Protein: 6.3 g/dL (ref 6.0–8.3)

## 2021-09-03 LAB — CBC WITH DIFFERENTIAL/PLATELET
Basophils Absolute: 0 10*3/uL (ref 0.0–0.1)
Basophils Relative: 0.5 % (ref 0.0–3.0)
Eosinophils Absolute: 0.2 10*3/uL (ref 0.0–0.7)
Eosinophils Relative: 3.9 % (ref 0.0–5.0)
HCT: 33.7 % — ABNORMAL LOW (ref 36.0–46.0)
Hemoglobin: 11.1 g/dL — ABNORMAL LOW (ref 12.0–15.0)
Lymphocytes Relative: 38.6 % (ref 12.0–46.0)
Lymphs Abs: 2.2 10*3/uL (ref 0.7–4.0)
MCHC: 32.8 g/dL (ref 30.0–36.0)
MCV: 91 fl (ref 78.0–100.0)
Monocytes Absolute: 0.3 10*3/uL (ref 0.1–1.0)
Monocytes Relative: 5.7 % (ref 3.0–12.0)
Neutro Abs: 2.9 10*3/uL (ref 1.4–7.7)
Neutrophils Relative %: 51.3 % (ref 43.0–77.0)
Platelets: 246 10*3/uL (ref 150.0–400.0)
RBC: 3.71 Mil/uL — ABNORMAL LOW (ref 3.87–5.11)
RDW: 14.6 % (ref 11.5–15.5)
WBC: 5.6 10*3/uL (ref 4.0–10.5)

## 2021-09-03 LAB — B12 AND FOLATE PANEL
Folate: 17 ng/mL (ref >5.9)
Vitamin B-12: 1359 pg/mL — ABNORMAL HIGH (ref 211–911)

## 2021-09-03 MED ORDER — HYDROCORTISONE 2.5 % EX CREA: TOPICAL_CREAM | Freq: Two times a day (BID) | CUTANEOUS | 1 refills | Status: DC

## 2021-09-03 MED ORDER — GABAPENTIN 100 MG PO CAPS: 100.0000 mg | ORAL_CAPSULE | Freq: Three times a day (TID) | ORAL | 3 refills | Status: DC

## 2021-09-03 NOTE — Patient Instructions (Signed)
Follow up as needed or as scheduled We'll notify you of your lab results and make any changes if needed USE the Hydrocortisone cream on the hemorrhoids twice daily If the hemorrhoids don't improve over the next few weeks, let me know and we can send you to GI START the Gabapentin 3x/day for the neuropathy Call with any questions or concerns Hang in there!

## 2021-09-03 NOTE — Progress Notes (Signed)
Subjective:    Patient ID: Tracy [name], female    DOB: August 16, 1945, 76 y.o.   MRN: [id number]  HPI Hemorrhoids- sxs started 2-3 weeks ago.  Reports area is is swollen and painful.  Is rubbing on underwear.  No blood.  Denies constipation.  Has used OTC Preparation H but this is no longer effective.  Neuropathy- sxs started worsening '1 week before her treatment'.  Sxs were previously only at night but now 'constant'.  Described as 'feeling cold' along w/ numbness and burning.  Sxs have dramatically worsened in the last 4-5 days.  Taking Tylenol 1000mg  q4.  Has finished Hydrocodone.  Pt has been using Tizanidine w/o relief.  Nothing improves pain.  Worse w/ standing.  Some improvement w/ movement.   Review of Systems For ROS see HPI   This visit occurred during the SARS-CoV-2 public health emergency.  Safety protocols were in place, including screening questions prior to the visit, additional usage of staff PPE, and extensive cleaning of exam room while observing appropriate contact time as indicated for disinfecting solutions.      Objective:   Physical Exam Vitals reviewed. Exam conducted with a chaperone present.  Constitutional:      General: She is not in acute distress.    Appearance: Normal appearance. She is obese. She is not ill-appearing.  HENT:     Head: Normocephalic and atraumatic.  Eyes:     Extraocular Movements: Extraocular movements intact.     Conjunctiva/sclera: Conjunctivae normal.     Pupils: Pupils are equal, round, and reactive to light.  Cardiovascular:     Rate and Rhythm: Normal rate and regular rhythm.     Pulses: Normal pulses.     Heart sounds: Normal heart sounds.  Pulmonary:     Effort: Pulmonary effort is normal. No respiratory distress.     Breath sounds: Normal breath sounds.  Genitourinary:    Rectum: External hemorrhoid (visible hemorrhoid and residual skin tag) present.  Musculoskeletal:        General: No swelling, tenderness or deformity.      Right lower leg: No edema.     Left lower leg: No edema.  Skin:    General: Skin is warm and dry.     Findings: No bruising, lesion or rash.  Neurological:     General: No focal deficit present.     Mental Status: She is alert and oriented to person, place, and time.     Coordination: Coordination normal.     Gait: Gait normal.  Psychiatric:        Mood and Affect: Mood normal.        Behavior: Behavior normal.          Assessment & Plan:   Neuropathy- deteriorated.  Pt has hx of this but reports it has been much worse recently.  Discussed the contribution of chemo, diabetes, and age.  Will start Gabapentin 100mg  TID and monitor for improvement.  Will also check labs to r/o metabolic causes.  Pt expressed understanding and is in agreement w/ plan.   Accidental Tylenol OD- new.  Due to the pain from her neuropathy, she was taking up to 6+ grams of tylenol.  Discussed that this is too much and can be damaging to her liver.  Check LFTs for any abnormality.  Hemorrhoid- new.  Pt w/ both an external hemorrhoid and a residual skin tag.  Start topical Hydrocortisone cream and discussed keeping area clean and dry.  If no  improvement w/ medication will refer to GI.  Pt expressed understanding and is in agreement w/ plan.

## 2021-09-04 ENCOUNTER — Telehealth: Payer: Self-pay

## 2021-09-04 LAB — IRON,TIBC AND FERRITIN PANEL
%SAT: 24 % (calc) (ref 16–45)
Ferritin: 23 ng/mL (ref 16–288)
Iron: 77 ug/dL (ref 45–160)
TIBC: 321 mcg/dL (calc) (ref 250–450)

## 2021-09-04 NOTE — Telephone Encounter (Signed)
Caller name:Mellisa Barbian  On DPR? :Yes  Call back number:[phone] or leave a message on [phone]  Provider they see: Birdie Riddle   Reason for call:Pt called said she forgot to tell her that she is allergic to hydrocortisone 2.5 % cream  can something else be sent in she is getting worse do to allergic reaction  [place] [place] [facility] [place]

## 2021-09-04 NOTE — Telephone Encounter (Signed)
Left vm for patient to return my call.   When she returns call, also make her aware of her labs  "Labs look great!  Continue with the Gabapentin as discussed at our visit "

## 2021-09-04 NOTE — Telephone Encounter (Signed)
We do not have any documentation of a hydrocortisone allergy- so I do apologize!  She can try OTC Preparation H or Tucks pads to see if sxs improve

## 2021-09-04 NOTE — Telephone Encounter (Signed)
Py reports she is allergic to Hydrocortisone cream and is asking for an alternative

## 2021-09-07 ENCOUNTER — Telehealth: Payer: Self-pay | Admitting: Pharmacist

## 2021-09-07 ENCOUNTER — Telehealth: Payer: Self-pay

## 2021-09-07 ENCOUNTER — Other Ambulatory Visit: Payer: Self-pay | Admitting: Hematology and Oncology

## 2021-09-07 DIAGNOSIS — Z1231 Encounter for screening mammogram for malignant neoplasm of breast: Secondary | ICD-10-CM

## 2021-09-07 NOTE — Telephone Encounter (Signed)
Patient is calling asking if she can add one more gabapentin at night. She is currently taking 3 100mg  capsules daily.

## 2021-09-07 NOTE — Telephone Encounter (Signed)
Ok to add another gabapentin at night.  She can take 1 in the morning, 1 in the afternoon and 2 at bed to help her sleep

## 2021-09-07 NOTE — Progress Notes (Signed)
Chronic Care Management Pharmacy Assistant   Name: Tracy [name]  MRN: [id number] DOB: 08/09/1945   Reason for Encounter: Disease State - General Adherence Call     Recent office visits:  09/03/21 Tracy [name]. MD- Family Medicine - Neuropathy - Labs were ordered. gabapentin (NEURONTIN) 100 MG capsule and hydrocortisone 2.5 % cream prescribed. Follow up as scheduled.   08/22/21 Tracy Asa, MD - Family Medicine - Viral upper respiratory - Continue Azelastin nasal spray. Add Flonase or Nasonex 3-5 days for congestion. Continue Mucinex. Add Coricidin HPB and drink plenty of fluids. Follow up if no improvement.   08/13/21 Tracy Coss, NP - Family Medicine - Acute upper respiratory infection - benzonatate (TESSALON) 100 MG capsule, dextromethorphan-guaiFENesin (MUCINEX DM) 30-600 MG 12hr tablet, doxycycline (VIBRA-TABS) 100 MG tablet, and oxymetazoline (AFRIN) 0.05 % nasal spray. Given med hx, low threshold for treatment. Will give abx, prednisone, and supportive care as above. Return in 7 days if no improvement.   06/07/21 Tracy Asa, MD - Family Medicine - Right flank pain - labs were ordered. CT Renal stone ordered. Follow up as needed.   05/29/21 Tracy Coss, NP - Family Medicine (Video visit) -  azelastine (ASTELIN) 0.1 % nasal spray and azithromycin (ZITHROMAX) 250 MG tablet prescribed. Follow up as needed,   05/04/21 Tracy Asa, MD - Family Medicine - Pain of upper abdomen - dicyclomine (BENTYL) 10 MG capsule, hydrOXYzine (VISTARIL) 25 MG capsule, and sucralfate (CARAFATE) 1 g tablet prescribed. Restart Pantoprazole twice daily. Follow up in 4-6 weeks.    Recent consult visits:  None noted.  Hospital visits: 04/30/21 Medication Reconciliation was completed by comparing discharge summary, patients EMR and Pharmacy list, and upon discussion with patient.  Admitted to the hospital on 04/30/21 due to Abdominal pain. Discharge date was 04/30/21. Discharged from  [place]?Medications Started at [facility] Discharge:?? triamcinolone cream (KENALOG) 0.1 % prescribed   Medication Changes at Hospital Discharge: None noted   Medications Discontinued at Hospital Discharge: None noted  Medications that remain the same after Hospital Discharge:??  All other medications will remain the same.    Hospital visits: 03/16/21-03/19/21 Medication Reconciliation was completed by comparing discharge summary, patients EMR and Pharmacy list, and upon discussion with patient.  Admitted to the hospital on 04/30/21 due to Abdominal pain. Discharge date was 04/30/21. Discharged from [place]?Medications Started at [facility] Discharge:?? None noted  Medication Changes at Hospital Discharge: None noted   Medications Discontinued at Hospital Discharge: None noted  Medications that remain the same after Hospital Discharge:??  All other medications will remain the same.    Medications: Outpatient Encounter Medications as of 09/07/2021  Medication Sig Note   atorvastatin (LIPITOR) 10 MG tablet Take 1 tablet (10 mg total) by mouth daily.    azelastine (ASTELIN) 0.1 % nasal spray Place 1 spray into both nostrils 2 (two) times daily. Use in each nostril as directed    dexamethasone (DECADRON) 4 MG tablet Take 3 tabs with breakfast the day after each treatment.    gabapentin (NEURONTIN) 100 MG capsule Take 1 capsule (100 mg total) by mouth 3 (three) times daily.    glucose blood (ACCU-CHEK GUIDE) test strip Use as instructed to check sugars 1-2 times daily.    hydrocortisone 2.5 % cream Apply topically 2 (two) times daily.    lidocaine-prilocaine (EMLA) cream APPLY 1 APPLICATION TOPICALLY AS NEEDED.    loratadine (CLARITIN) 10 MG tablet Take 10 mg by  mouth daily.    meclizine (ANTIVERT) 25 MG tablet Take 1 tablet (25 mg total) by mouth 3 (three) times daily as needed for dizziness. 08/22/2021: PRN   oxymetazoline  (AFRIN) 0.05 % nasal spray Place 1 spray into both nostrils 2 (two) times daily as needed for congestion. Or bloody nose.    pantoprazole (PROTONIX) 40 MG tablet TAKE 1 TABLET(40 MG) BY MOUTH TWICE DAILY    tiZANidine (ZANAFLEX) 4 MG tablet TAKE 1 TABLET(4 MG) BY MOUTH AT BEDTIME    traZODone (DESYREL) 100 MG tablet TAKE 1 TABLET BY MOUTH AT  BEDTIME    triamcinolone cream (KENALOG) 0.1 % Apply 1 application topically 2 (two) times daily.    Vitamin D, Ergocalciferol, (DRISDOL) 1.25 MG (50000 UNIT) CAPS capsule TAKE 1 CAPSULE BY MOUTH 1  TIME A WEEK    Facility-Administered Encounter Medications as of 09/07/2021  Medication   sodium chloride flush (NS) 0.9 % injection 10 mL    Have you had any problems recently with your health? Patient reported she is having some issues with neuropathy and the Gabapentin is not helping. She has called [name] and left a message today. She was wanting to know if she could increase her current medications.   Have you had any problems with your pharmacy? Patient denied any problems with her current pharmacy.   What issues or side effects are you having with your medications? Patient denied any side effects or issues with current medications.   What would you like me to pass along to Tracy [name], [name] for them to help you with?  Patient did not have anything to pass along to [name] at this time.   What can we do to take care of you better? Patient did not have any recommendations at this time.   Care Gaps  AWV: done 08/21/20 Colonoscopy: done 05/16/20 DM Eye Exam: due 05/19/20 DM Foot Exam: due 08/19/20 Microalbumin: unknown  HbgAIC: done 04/10/21 (7.1) DEXA: done 01/30/21  Mammogram: scheduled 09/21/21  Star Rating Drugs: Atorvastatin (LIPITOR) 10 MG tablet - last filled 06/16/21 90 days   Future Appointments  Date Time Provider [place]  09/13/2021 12:30 PM Tracy Minium, MD [id number]  09/14/2021 10:30 AM [facility] [place] FLUSH  [facility] None  09/14/2021 11:30 AM [facility] INFUSION [facility] None  09/20/2021 10:00 AM [facility] INFUSION [facility] None  09/21/2021  3:10 PM [facility] MM 2 [facility] CE  10/12/2021 11:30 AM [facility] [place] FLUSH [facility] None  10/12/2021 12:00 PM Tracy Genera, MD [facility] None  10/12/2021  1:00 PM [facility] INFUSION [facility] None  11/09/2021 11:00 AM [facility] [place] FLUSH [facility] None  11/09/2021 12:00 PM [facility] INFUSION [facility] None  12/07/2021 11:00 AM [facility] [place] FLUSH [facility] None  12/07/2021 12:00 PM [facility] INFUSION [facility] None    [name], [place] Clinical Pharmacist Assistant  [phone]

## 2021-09-07 NOTE — Telephone Encounter (Signed)
Called and made patient aware. 

## 2021-09-07 NOTE — Telephone Encounter (Signed)
Caller name:Camala Deike   On DPR? :Yes  Call back number:[phone]  Provider they see: Birdie Riddle   Reason for call:Pt is calling about the medication for the gabapentin (NEURONTIN) 100 MG capsule  pt  is wanting to see if she could take 4 daily one at night?

## 2021-09-10 ENCOUNTER — Telehealth: Payer: Self-pay

## 2021-09-10 NOTE — Telephone Encounter (Signed)
Patient is aware and understood

## 2021-09-10 NOTE — Telephone Encounter (Signed)
Caller name:Wonda Jarvis   On DPR? :Yes  Call back number:[phone]  Provider they see: Birdie Riddle   Reason for call:Pt is calling she was put on gabapentin (NEURONTIN) 100 MG capsule  last Monday and increased last Thursday and this medication is not working not taking pain her feet not going away please advise

## 2021-09-10 NOTE — Telephone Encounter (Signed)
She can increase her dose to 2 tabs 3x/day to see if this improves her sxs.  It will take time for the medication to work but we can adjust based on her level of symptoms

## 2021-09-10 NOTE — Telephone Encounter (Signed)
Patient states that she was put on Gabapentin for pain in her feet and it was also increased Last week in dose and she feels the medication is still not helping her.

## 2021-09-13 ENCOUNTER — Other Ambulatory Visit: Payer: Self-pay

## 2021-09-13 ENCOUNTER — Encounter: Payer: Medicare Other | Admitting: Family Medicine

## 2021-09-13 DIAGNOSIS — C9000 Multiple myeloma not having achieved remission: Secondary | ICD-10-CM

## 2021-09-14 ENCOUNTER — Inpatient Hospital Stay: Payer: Medicare Other | Attending: Hematology

## 2021-09-14 ENCOUNTER — Other Ambulatory Visit: Payer: Self-pay | Admitting: Hematology

## 2021-09-14 ENCOUNTER — Other Ambulatory Visit: Payer: Self-pay

## 2021-09-14 ENCOUNTER — Inpatient Hospital Stay: Payer: Medicare Other

## 2021-09-14 VITALS — BP 157/72 | HR 87 | Temp 98.9°F | Resp 16 | Wt 178.2 lb

## 2021-09-14 DIAGNOSIS — Z7189 Other specified counseling: Secondary | ICD-10-CM

## 2021-09-14 DIAGNOSIS — Z95828 Presence of other vascular implants and grafts: Secondary | ICD-10-CM

## 2021-09-14 DIAGNOSIS — N189 Chronic kidney disease, unspecified: Secondary | ICD-10-CM | POA: Insufficient documentation

## 2021-09-14 DIAGNOSIS — C7951 Secondary malignant neoplasm of bone: Secondary | ICD-10-CM

## 2021-09-14 DIAGNOSIS — Z8673 Personal history of transient ischemic attack (TIA), and cerebral infarction without residual deficits: Secondary | ICD-10-CM | POA: Diagnosis not present

## 2021-09-14 DIAGNOSIS — Z87891 Personal history of nicotine dependence: Secondary | ICD-10-CM | POA: Insufficient documentation

## 2021-09-14 DIAGNOSIS — D509 Iron deficiency anemia, unspecified: Secondary | ICD-10-CM | POA: Insufficient documentation

## 2021-09-14 DIAGNOSIS — C9000 Multiple myeloma not having achieved remission: Secondary | ICD-10-CM | POA: Insufficient documentation

## 2021-09-14 DIAGNOSIS — Z923 Personal history of irradiation: Secondary | ICD-10-CM | POA: Insufficient documentation

## 2021-09-14 DIAGNOSIS — Z5112 Encounter for antineoplastic immunotherapy: Secondary | ICD-10-CM | POA: Diagnosis not present

## 2021-09-14 DIAGNOSIS — Z853 Personal history of malignant neoplasm of breast: Secondary | ICD-10-CM | POA: Insufficient documentation

## 2021-09-14 DIAGNOSIS — Z79899 Other long term (current) drug therapy: Secondary | ICD-10-CM | POA: Insufficient documentation

## 2021-09-14 DIAGNOSIS — I252 Old myocardial infarction: Secondary | ICD-10-CM | POA: Insufficient documentation

## 2021-09-14 DIAGNOSIS — E1140 Type 2 diabetes mellitus with diabetic neuropathy, unspecified: Secondary | ICD-10-CM | POA: Insufficient documentation

## 2021-09-14 DIAGNOSIS — E538 Deficiency of other specified B group vitamins: Secondary | ICD-10-CM | POA: Diagnosis not present

## 2021-09-14 DIAGNOSIS — E1122 Type 2 diabetes mellitus with diabetic chronic kidney disease: Secondary | ICD-10-CM | POA: Diagnosis not present

## 2021-09-14 LAB — CBC WITH DIFFERENTIAL (CANCER CENTER ONLY)
Abs Immature Granulocytes: 0.01 10*3/uL (ref 0.00–0.07)
Basophils Absolute: 0.1 10*3/uL (ref 0.0–0.1)
Basophils Relative: 1 %
Eosinophils Absolute: 0.2 10*3/uL (ref 0.0–0.5)
Eosinophils Relative: 4 %
HCT: 32.6 % — ABNORMAL LOW (ref 36.0–46.0)
Hemoglobin: 10.6 g/dL — ABNORMAL LOW (ref 12.0–15.0)
Immature Granulocytes: 0 %
Lymphocytes Relative: 44 %
Lymphs Abs: 2.1 10*3/uL (ref 0.7–4.0)
MCH: 30.1 pg (ref 26.0–34.0)
MCHC: 32.5 g/dL (ref 30.0–36.0)
MCV: 92.6 fL (ref 80.0–100.0)
Monocytes Absolute: 0.3 10*3/uL (ref 0.1–1.0)
Monocytes Relative: 6 %
Neutro Abs: 2.2 10*3/uL (ref 1.7–7.7)
Neutrophils Relative %: 45 %
Platelet Count: 296 10*3/uL (ref 150–400)
RBC: 3.52 MIL/uL — ABNORMAL LOW (ref 3.87–5.11)
RDW: 13.7 % (ref 11.5–15.5)
WBC Count: 4.9 10*3/uL (ref 4.0–10.5)
nRBC: 0 % (ref 0.0–0.2)

## 2021-09-14 LAB — CMP (CANCER CENTER ONLY)
ALT: 13 U/L (ref 0–44)
AST: 18 U/L (ref 15–41)
Albumin: 4.1 g/dL (ref 3.5–5.0)
Alkaline Phosphatase: 68 U/L (ref 38–126)
Anion gap: 8 (ref 5–15)
BUN: 13 mg/dL (ref 8–23)
CO2: 26 mmol/L (ref 22–32)
Calcium: 8.9 mg/dL (ref 8.9–10.3)
Chloride: 107 mmol/L (ref 98–111)
Creatinine: 1.27 mg/dL — ABNORMAL HIGH (ref 0.44–1.00)
GFR, Estimated: 44 mL/min — ABNORMAL LOW (ref >60)
Glucose, Bld: 138 mg/dL — ABNORMAL HIGH (ref 70–99)
Potassium: 3.9 mmol/L (ref 3.5–5.1)
Sodium: 141 mmol/L (ref 135–145)
Total Bilirubin: 0.3 mg/dL (ref 0.3–1.2)
Total Protein: 6.5 g/dL (ref 6.5–8.1)

## 2021-09-14 MED ORDER — FAMOTIDINE IN NACL 20-0.9 MG/50ML-% IV SOLN
20.0000 mg | Freq: Once | INTRAVENOUS | Status: AC
  Administered 2021-09-14: 20 mg via INTRAVENOUS
  Filled 2021-09-14: qty 50

## 2021-09-14 MED ORDER — DIPHENHYDRAMINE HCL 25 MG PO CAPS
50.0000 mg | ORAL_CAPSULE | Freq: Once | ORAL | Status: AC
  Administered 2021-09-14: 50 mg via ORAL
  Filled 2021-09-14: qty 2

## 2021-09-14 MED ORDER — SODIUM CHLORIDE 0.9 % IV SOLN: Freq: Once | INTRAVENOUS | Status: AC

## 2021-09-14 MED ORDER — SODIUM CHLORIDE 0.9% FLUSH
10.0000 mL | INTRAVENOUS | Status: DC | PRN
  Administered 2021-09-14: 10 mL

## 2021-09-14 MED ORDER — SODIUM CHLORIDE 0.9 % IV SOLN
16.0000 mg/kg | Freq: Once | INTRAVENOUS | Status: AC
  Administered 2021-09-14: 1400 mg via INTRAVENOUS
  Filled 2021-09-14: qty 60

## 2021-09-14 MED ORDER — SODIUM CHLORIDE 0.9 % IV SOLN
20.0000 mg | Freq: Once | INTRAVENOUS | Status: AC
  Administered 2021-09-14: 20 mg via INTRAVENOUS
  Filled 2021-09-14: qty 20

## 2021-09-14 MED ORDER — ACETAMINOPHEN 325 MG PO TABS
650.0000 mg | ORAL_TABLET | Freq: Once | ORAL | Status: AC
  Administered 2021-09-14: 650 mg via ORAL
  Filled 2021-09-14: qty 2

## 2021-09-14 MED ORDER — SODIUM CHLORIDE 0.9% FLUSH
10.0000 mL | Freq: Once | INTRAVENOUS | Status: AC
  Administered 2021-09-14: 10 mL

## 2021-09-14 MED ORDER — CYANOCOBALAMIN 1000 MCG/ML IJ SOLN: 1000.0000 ug | Freq: Once | INTRAMUSCULAR | Status: DC

## 2021-09-14 MED ORDER — HEPARIN SOD (PORK) LOCK FLUSH 100 UNIT/ML IV SOLN
500.0000 [IU] | Freq: Once | INTRAVENOUS | Status: AC | PRN
  Administered 2021-09-14: 500 [IU]

## 2021-09-14 NOTE — Patient Instructions (Signed)
[facility]  Discharge Instructions: °Thank you for choosing [facility] to provide your oncology and hematology care.  ° °If you have a lab appointment with the Cancer Center, please go directly to the Cancer Center and check in at the registration area. °  °Wear comfortable clothing and clothing appropriate for easy access to any Portacath or PICC line.  ° °We strive to give you quality time with your provider. You may need to reschedule your appointment if you arrive late (15 or more minutes).  Arriving late affects you and other patients whose appointments are after yours.  Also, if you miss three or more appointments without notifying the office, you may be dismissed from the clinic at the provider’s discretion.    °  °For prescription refill requests, have your pharmacy contact our office and allow 72 hours for refills to be completed.   ° °Today you received the following chemotherapy and/or immunotherapy agents: Darzalex  °  °To help prevent nausea and vomiting after your treatment, we encourage you to take your nausea medication as directed. ° °BELOW ARE SYMPTOMS THAT SHOULD BE REPORTED IMMEDIATELY: °*FEVER GREATER THAN 100.4 F (38 °C) OR HIGHER °*CHILLS OR SWEATING °*NAUSEA AND VOMITING THAT IS NOT CONTROLLED WITH YOUR NAUSEA MEDICATION °*UNUSUAL SHORTNESS OF BREATH °*UNUSUAL BRUISING OR BLEEDING °*URINARY PROBLEMS (pain or burning when urinating, or frequent urination) °*BOWEL PROBLEMS (unusual diarrhea, constipation, pain near the anus) °TENDERNESS IN MOUTH AND THROAT WITH OR WITHOUT PRESENCE OF ULCERS (sore throat, sores in mouth, or a toothache) °UNUSUAL RASH, SWELLING OR PAIN  °UNUSUAL VAGINAL DISCHARGE OR ITCHING  ° °Items with * indicate a potential emergency and should be followed up as soon as possible or go to the Emergency Department if any problems should occur. ° °Please show the CHEMOTHERAPY ALERT CARD or IMMUNOTHERAPY ALERT CARD at check-in to the  Emergency Department and triage nurse. ° °Should you have questions after your visit or need to cancel or reschedule your appointment, please contact [facility]  Dept: [phone]  and follow the prompts.  Office hours are 8:00 a.m. to 4:30 p.m. Monday - Friday. Please note that voicemails left after 4:00 p.m. may not be returned until the following business day.  We are closed weekends and major holidays. You have access to a nurse at all times for urgent questions. Please call the main number to the clinic Dept: [phone] and follow the prompts. ° ° °For any non-urgent questions, you may also contact your provider using MyChart. We now offer e-Visits for anyone 18 and older to request care online for non-urgent symptoms. For details visit [email or web address]. °  °Also download the MyChart app! Go to the app store, search "MyChart", open the app, select [facility], and log in with your MyChart username and password. ° °Due to Covid, a mask is required upon entering the hospital/clinic. If you do not have a mask, one will be given to you upon arrival. For doctor visits, patients may have 1 support person aged 18 or older with them. For treatment visits, patients cannot have anyone with them due to current Covid guidelines and our immunocompromised population.  ° °

## 2021-09-14 NOTE — Progress Notes (Signed)
Pamidronate remains on hold for now per MD.  Acquanetta Belling, RPH, BCPS, BCOP 09/14/2021 12:39 PM

## 2021-09-14 NOTE — Progress Notes (Signed)
Patient reports an increase in her peripheral neuropathy. She said her PCP is aware but she said it has been getting increasingly worse for the past 3 weeks and last night was very bad. Dr. Irene Limbo informed.

## 2021-09-17 ENCOUNTER — Telehealth: Payer: Self-pay | Admitting: Family Medicine

## 2021-09-17 MED ORDER — GABAPENTIN 100 MG PO CAPS: 200.0000 mg | ORAL_CAPSULE | Freq: Three times a day (TID) | ORAL | 3 refills | Status: DC

## 2021-09-17 NOTE — Telephone Encounter (Signed)
New prescription sent to [facility]

## 2021-09-17 NOTE — Progress Notes (Signed)
[facility]/Dr Willa Rough (Dentist) to request dental clearance for pt to restart Aredia. Office contact info: [phone].

## 2021-09-17 NOTE — Telephone Encounter (Signed)
Pt called in stating the Dr. Birdie Riddle changed her gabapentin script. She states that she takes 2 tabs 3 times a day. She is out of the script. Can we send in a new one to the [facility] on [address].   Please advise

## 2021-09-20 ENCOUNTER — Inpatient Hospital Stay: Payer: Medicare Other

## 2021-09-20 ENCOUNTER — Other Ambulatory Visit: Payer: Self-pay

## 2021-09-20 ENCOUNTER — Other Ambulatory Visit: Payer: Self-pay | Admitting: Hematology

## 2021-09-20 VITALS — BP 164/89 | HR 85 | Temp 98.8°F | Resp 18 | Wt 179.8 lb

## 2021-09-20 DIAGNOSIS — Z87891 Personal history of nicotine dependence: Secondary | ICD-10-CM | POA: Diagnosis not present

## 2021-09-20 DIAGNOSIS — N189 Chronic kidney disease, unspecified: Secondary | ICD-10-CM | POA: Diagnosis not present

## 2021-09-20 DIAGNOSIS — E538 Deficiency of other specified B group vitamins: Secondary | ICD-10-CM | POA: Diagnosis not present

## 2021-09-20 DIAGNOSIS — Z853 Personal history of malignant neoplasm of breast: Secondary | ICD-10-CM | POA: Diagnosis not present

## 2021-09-20 DIAGNOSIS — C7951 Secondary malignant neoplasm of bone: Secondary | ICD-10-CM

## 2021-09-20 DIAGNOSIS — E1140 Type 2 diabetes mellitus with diabetic neuropathy, unspecified: Secondary | ICD-10-CM | POA: Diagnosis not present

## 2021-09-20 DIAGNOSIS — Z79899 Other long term (current) drug therapy: Secondary | ICD-10-CM | POA: Diagnosis not present

## 2021-09-20 DIAGNOSIS — D509 Iron deficiency anemia, unspecified: Secondary | ICD-10-CM | POA: Diagnosis not present

## 2021-09-20 DIAGNOSIS — C9000 Multiple myeloma not having achieved remission: Secondary | ICD-10-CM | POA: Diagnosis not present

## 2021-09-20 DIAGNOSIS — Z8673 Personal history of transient ischemic attack (TIA), and cerebral infarction without residual deficits: Secondary | ICD-10-CM | POA: Diagnosis not present

## 2021-09-20 DIAGNOSIS — Z923 Personal history of irradiation: Secondary | ICD-10-CM | POA: Diagnosis not present

## 2021-09-20 DIAGNOSIS — E1122 Type 2 diabetes mellitus with diabetic chronic kidney disease: Secondary | ICD-10-CM | POA: Diagnosis not present

## 2021-09-20 DIAGNOSIS — I252 Old myocardial infarction: Secondary | ICD-10-CM | POA: Diagnosis not present

## 2021-09-20 DIAGNOSIS — Z5112 Encounter for antineoplastic immunotherapy: Secondary | ICD-10-CM | POA: Diagnosis not present

## 2021-09-20 MED ORDER — SODIUM CHLORIDE 0.9 % IV SOLN: Freq: Once | INTRAVENOUS | Status: AC

## 2021-09-20 MED ORDER — CYANOCOBALAMIN 1000 MCG/ML IJ SOLN
1000.0000 ug | Freq: Once | INTRAMUSCULAR | Status: AC
  Administered 2021-09-20: 1000 ug via SUBCUTANEOUS
  Filled 2021-09-20: qty 1

## 2021-09-20 MED ORDER — HEPARIN SOD (PORK) LOCK FLUSH 100 UNIT/ML IV SOLN
250.0000 [IU] | Freq: Once | INTRAVENOUS | Status: AC | PRN
  Administered 2021-09-20: 250 [IU]

## 2021-09-20 MED ORDER — SODIUM CHLORIDE 0.9% FLUSH
10.0000 mL | Freq: Once | INTRAVENOUS | Status: AC | PRN
  Administered 2021-09-20: 10 mL

## 2021-09-20 MED ORDER — SODIUM CHLORIDE 0.9 % IV SOLN
60.0000 mg | Freq: Once | INTRAVENOUS | Status: AC
  Administered 2021-09-20: 60 mg via INTRAVENOUS
  Filled 2021-09-20: qty 10

## 2021-09-20 NOTE — Progress Notes (Signed)
[facility]/Dr Willa Rough (Dentist) to request dental clearance for pt to restart Aredia. Office contact info: [phone].  Per Dr. Mina Marble pt is clear for Aredia treatment.

## 2021-09-20 NOTE — Patient Instructions (Signed)
Pamidronate Injection What is this medication? PAMIDRONATE (pa mi DROE nate) slows calcium loss from bones. It treats Paget's disease and high calcium levels in the blood from some kinds of cancer. It may be used in other people at risk for bone loss. This medicine may be used for other purposes; ask your health care provider or pharmacist if you have questions. COMMON BRAND NAME(S): Aredia What should I tell my care team before I take this medication? They need to know if you have any of these conditions: bleeding disorder cancer dental disease kidney disease low levels of calcium or other minerals in the blood low red blood cell counts receiving steroids like dexamethasone or prednisone an unusual or allergic reaction to pamidronate, other drugs, foods, dyes or preservatives pregnant or trying to get pregnant breast-feeding How should I use this medication? This drug is injected into a vein. It is given by a health care provider in a hospital or clinic setting. Talk to your health care provider about the use of this drug in children. Special care may be needed. Overdosage: If you think you have taken too much of this medicine contact a poison control center or emergency room at once. NOTE: This medicine is only for you. Do not share this medicine with others. What if I miss a dose? Keep appointments for follow-up doses. It is important not to miss your dose. Call your health care provider if you are unable to keep an appointment. What may interact with this medication? certain antibiotics given by injection medicines for inflammation or pain like ibuprofen, naproxen some diuretics like bumetanide, furosemide cyclosporine parathyroid hormone tacrolimus teriparatide thalidomide This list may not describe all possible interactions. Give your health care provider a list of all the medicines, herbs, non-prescription drugs, or dietary supplements you use. Also tell them if you smoke,  drink alcohol, or use illegal drugs. Some items may interact with your medicine. What should I watch for while using this medication? Visit your health care provider for regular checks on your progress. It may be some time before you see the benefit from this drug. Some people who take this drug have severe bone, joint, or muscle pain. This drug may also increase your risk for jaw problems or a broken thigh bone. Tell your health care provider right away if you have severe pain in your jaw, bones, joints, or muscles. Tell you health care provider if you have any pain that does not go away or that gets worse. Tell your dentist and dental surgeon that you are taking this drug. You should not have major dental surgery while on this drug. See your dentist to have a dental exam and fix any dental problems before starting this drug. Take good care of your teeth while on this drug. Make sure you see your dentist for regular follow-up appointments. You should make sure you get enough calcium and vitamin D while you are taking this drug. Discuss the foods you eat and the vitamins you take with your health care provider. You may need blood work done while you are taking this drug. Do not become pregnant while taking this drug. Women should inform their health care provider if they wish to become pregnant or think they might be pregnant. There is potential for serious harm to an unborn child. Talk to your health care provider for more information. What side effects may I notice from receiving this medication? Side effects that you should report to your doctor or health care  provider as soon as possible: allergic reactions (skin rash, itching or hives; swelling of the face, lips, or tongue) bleeding (bloody or black, tarry stools; red or dark brown urine; spitting up blood or brown material that looks like coffee grounds; red spots on the skin; unusual bruising or bleeding from the eyes, gums, or nose) bone  pain increased thirst infection (fever, chills, cough, sore throat, pain or trouble passing urine) jaw pain, especially after dental work joint pain kidney injury (trouble passing urine or change in the amount of urine) low calcium levels (fast heartbeat; muscle cramps or pain; pain, tingling, or numbness in the hands or feet; seizures) low magnesium levels (fast, irregular heartbeat; muscle cramp or pain; muscle weakness; tremors; seizures) low potassium levels (trouble breathing; chest pain; dizziness; fast, irregular heartbeat; feeling faint or lightheaded, falls; muscle cramps or pain) muscle pain pain, redness, or irritation at site where injected redness, blistering, peeling, or loosening of the skin, including inside the mouth severe diarrhea unusual sweating Side effects that usually do not require medical attention (report to your doctor or health care provider if they continue or are bothersome): constipation eye irritation, itching, or pain fever headache increase in blood pressure loss of appetite nausea stomach pain unusually weak or tired vomiting This list may not describe all possible side effects. Call your doctor for medical advice about side effects. You may report side effects to FDA at 1-800-FDA-1088. Where should I keep my medication? This drug is given in a hospital or clinic. It will not be stored at home. NOTE: This sheet is a summary. It may not cover all possible information. If you have questions about this medicine, talk to your doctor, pharmacist, or health care provider. Vitamin B12 Injection What is this medication? Vitamin B12 (VAHY tuh min B12) prevents and treats low vitamin B12 levels in your body. It is used in people who do not get enough vitamin B12 from their diet or when their digestive tract does not absorb enough. Vitamin B12 plays an important role in maintaining the health of your nervous system and red blood cells. This medicine may be  used for other purposes; ask your health care provider or pharmacist if you have questions. COMMON BRAND NAME(S): B-12 Compliance Kit, B-12 Injection Kit, Cyomin, Dodex, LA-12, Nutri-Twelve, Physicians EZ Use B-12, Primabalt What should I tell my care team before I take this medication? They need to know if you have any of these conditions: Kidney disease Leber's disease Megaloblastic anemia An unusual or allergic reaction to cyanocobalamin, cobalt, other medications, foods, dyes, or preservatives Pregnant or trying to get pregnant Breast-feeding How should I use this medication? This medication is injected into a muscle or deeply under the skin. It is usually given in a clinic or care team's office. However, your care team may teach you how to inject yourself. Follow all instructions. Talk to your care team about the use of this medication in children. Special care may be needed. Overdosage: If you think you have taken too much of this medicine contact a poison control center or emergency room at once. NOTE: This medicine is only for you. Do not share this medicine with others. What if I miss a dose? If you are given your dose at a clinic or care team's office, call to reschedule your appointment. If you give your own injections, and you miss a dose, take it as soon as you can. If it is almost time for your next dose, take only that dose.  Do not take double or extra doses. What may interact with this medication? Colchicine Heavy alcohol intake This list may not describe all possible interactions. Give your health care provider a list of all the medicines, herbs, non-prescription drugs, or dietary supplements you use. Also tell them if you smoke, drink alcohol, or use illegal drugs. Some items may interact with your medicine. What should I watch for while using this medication? Visit your care team regularly. You may need blood work done while you are taking this medication. You may need to  follow a special diet. Talk to your care team. Limit your alcohol intake and avoid smoking to get the best benefit. What side effects may I notice from receiving this medication? Side effects that you should report to your care team as soon as possible: Allergic reactions--skin rash, itching, hives, swelling of the face, lips, tongue, or throat Swelling of the ankles, hands, or feet Trouble breathing Side effects that usually do not require medical attention (report to your care team if they continue or are bothersome): Diarrhea This list may not describe all possible side effects. Call your doctor for medical advice about side effects. You may report side effects to FDA at 1-800-FDA-1088. Where should I keep my medication? Keep out of the reach of children. Store at room temperature between 15 and 30 degrees C (59 and 85 degrees F). Protect from light. Throw away any unused medication after the expiration date. NOTE: This sheet is a summary. It may not cover all possible information. If you have questions about this medicine, talk to your doctor, pharmacist, or health care provider.  2022 Elsevier/Gold Standard (2020-09-27 00:00:00)   2022 Elsevier/Gold Standard (2021-04-03 00:00:00)

## 2021-09-21 ENCOUNTER — Ambulatory Visit
Admission: RE | Admit: 2021-09-21 | Discharge: 2021-09-21 | Disposition: A | Discharge status: Home / Self Care | Payer: Medicare Other | Source: Ambulatory Visit | Attending: Hematology and Oncology | Admitting: Hematology and Oncology

## 2021-09-21 DIAGNOSIS — Z1231 Encounter for screening mammogram for malignant neoplasm of breast: Secondary | ICD-10-CM | POA: Diagnosis not present

## 2021-09-24 ENCOUNTER — Ambulatory Visit (INDEPENDENT_AMBULATORY_CARE_PROVIDER_SITE_OTHER): Payer: Medicare Other | Admitting: Family Medicine

## 2021-09-24 ENCOUNTER — Encounter: Payer: Self-pay | Admitting: Family Medicine

## 2021-09-24 VITALS — BP 124/70 | HR 72 | Temp 97.2°F | Resp 16 | Wt 176.8 lb

## 2021-09-24 DIAGNOSIS — E1140 Type 2 diabetes mellitus with diabetic neuropathy, unspecified: Secondary | ICD-10-CM | POA: Diagnosis not present

## 2021-09-24 DIAGNOSIS — E559 Vitamin D deficiency, unspecified: Secondary | ICD-10-CM | POA: Diagnosis not present

## 2021-09-24 DIAGNOSIS — Z0000 Encounter for general adult medical examination without abnormal findings: Secondary | ICD-10-CM | POA: Diagnosis not present

## 2021-09-24 LAB — LIPID PANEL
Cholesterol: 216 mg/dL — ABNORMAL HIGH (ref 0–200)
HDL: 89 mg/dL (ref >39.00)
LDL Cholesterol: 99 mg/dL (ref 0–99)
NonHDL: 127.11
Total CHOL/HDL Ratio: 2
Triglycerides: 142 mg/dL (ref 0.0–149.0)
VLDL: 28.4 mg/dL (ref 0.0–40.0)

## 2021-09-24 LAB — HEMOGLOBIN A1C: Hgb A1c MFr Bld: 7 % — ABNORMAL HIGH (ref 4.6–6.5)

## 2021-09-24 NOTE — Assessment & Plan Note (Signed)
Chronic problem.  Hx of good control.  UTD on urine, foot exam done today.  Check labs.  Adjust meds prn

## 2021-09-24 NOTE — Assessment & Plan Note (Signed)
Check labs and replete prn. 

## 2021-09-24 NOTE — Patient Instructions (Addendum)
Follow up in 6 months to recheck sugar, BP, and cholesterol We'll notify you of your lab results and make any changes if needed Keep the up the good work!  You look great!!! Call Dr Ardis Hughs about the hemorrhoid and swallowing Call with any questions or concerns Stay Safe!  Stay Healthy! Happy Spring!!!

## 2021-09-24 NOTE — Progress Notes (Signed)
Subjective:    Patient ID: Tracy [name], female    DOB: 01/29/1946, 76 y.o.   MRN: [id number]  HPI CPE- UTD on Tdap, PNA, DEXA, mammo.  No longer having colonoscopies.  Due for eye exam (scheduled for May), foot exam.  Patient Care Team    Relationship Specialty Notifications Start End  [name], MD PCP - General Family Medicine  02/03/12    Comment: Lyda Jester, MD Attending Physician Physical Medicine and Rehabilitation  05/31/15   [name], MD Consulting Physician Orthopedic Surgery  05/31/15   [name], MD Consulting Physician Otolaryngology  05/31/15   [name], MD Consulting Physician Gastroenterology  06/01/15   [name], MD Consulting Physician General Surgery  10/20/15   [name], [facility] Pharmacist Pharmacist Admissions 11/04/19    Comment: phone number [phone]    Health Maintenance  Topic Date Due   OPHTHALMOLOGY EXAM  05/19/2020   COVID-19 Vaccine (4 - Booster for Moderna series) 07/31/2020   FOOT EXAM  08/19/2020   Zoster Vaccines- Shingrix (1 of 2) 12/22/2021 (Originally 03/28/1965)   INFLUENZA VACCINE  02/25/2022 (Originally 02/26/2021)   HEMOGLOBIN A1C  10/08/2021   TETANUS/TDAP  04/06/2023   Pneumonia Vaccine 1+ Years old  Completed   DEXA SCAN  Completed   Hepatitis C Screening  Completed   HPV VACCINES  Aged Out   COLONOSCOPY (Pts 45-74yrs Insurance coverage will need to be confirmed)  Discontinued      Review of Systems Patient reports no vision/ hearing changes, adenopathy,fever, weight change,  persistant/recurrent hoarseness, chest pain, palpitations, edema, persistant/recurrent cough, hemoptysis, dyspnea (rest/exertional/paroxysmal nocturnal), gastrointestinal bleeding (melena, rectal bleeding), abdominal pain, significant heartburn, bowel changes, GU symptoms (dysuria, hematuria, incontinence), Gyn symptoms (abnormal  bleeding, pain),  syncope, memory loss, skin/hair/nail changes, abnormal bruising or bleeding,  anxiety, or depression.   + dysphagia- both food and water.  Sees Dr Ardis Hughs (GI) + weakness of hands bilaterally  This visit occurred during the SARS-CoV-2 public health emergency.  Safety protocols were in place, including screening questions prior to the visit, additional usage of staff PPE, and extensive cleaning of exam room while observing appropriate contact time as indicated for disinfecting solutions.      Objective:   Physical Exam General Appearance:    Alert, cooperative, no distress, appears stated age  Head:    Normocephalic, without obvious abnormality, atraumatic  Eyes:    PERRL, conjunctiva/corneas clear, EOM's intact, fundi    benign, both eyes  Ears:    Normal TM's and external ear canals, both ears  Nose:   Nares normal, septum midline, mucosa normal, no drainage    or sinus tenderness  Throat:   Lips, mucosa, and tongue normal; teeth and gums normal  Neck:   Supple, symmetrical, trachea midline, no adenopathy;    Thyroid: no enlargement/tenderness/nodules  Back:     Symmetric, no curvature, ROM normal, no CVA tenderness  Lungs:     Clear to auscultation bilaterally, respirations unlabored  Chest Wall:    No tenderness or deformity   Heart:    Regular rate and rhythm, S1 and S2 normal, no murmur, rub   or gallop  Breast Exam:    Deferred to mammo  Abdomen:     Soft, non-tender, bowel sounds active all four quadrants,    no masses, no organomegaly  Genitalia:    Deferred   Rectal:    Extremities:   Extremities normal, atraumatic, no cyanosis or edema  Pulses:  2+ and symmetric all extremities  Skin:   Skin color, texture, turgor normal, no rashes or lesions  Lymph nodes:   Cervical, supraclavicular, and axillary nodes normal  Neurologic:   CNII-XII intact, normal strength, sensation and reflexes    throughout          Assessment & Plan:

## 2021-09-24 NOTE — Assessment & Plan Note (Signed)
Ongoing issue for pt.  BMI is 35.71 but w/ her DM, HTN, hyperlipidemia this qualifies as morbidly obese.  Encouraged healthy diet and physical activity as able.  Will follow.

## 2021-09-24 NOTE — Assessment & Plan Note (Signed)
Pt's PE unchanged from previous and WNL w/ exception of obesity and known neuropathy.  UTD on Tdap, PNA, DEXA, mammo.  Eye exam scheduled.  Foot exam done today.  Check labs.  Anticipatory guidance provided.

## 2021-09-25 LAB — VITAMIN D 25 HYDROXY (VIT D DEFICIENCY, FRACTURES): VITD: 74.79 ng/mL (ref 30.00–100.00)

## 2021-09-28 ENCOUNTER — Telehealth: Payer: Self-pay

## 2021-09-28 NOTE — Telephone Encounter (Signed)
Patient aware of labs.  

## 2021-09-28 NOTE — Telephone Encounter (Signed)
-----   Message from [name], MD sent at 09/25/2021 12:25 PM EST ----- ?Labs look great!  No changes at this time ?

## 2021-10-02 ENCOUNTER — Other Ambulatory Visit: Payer: Self-pay | Admitting: Hematology and Oncology

## 2021-10-03 ENCOUNTER — Other Ambulatory Visit: Payer: Self-pay

## 2021-10-03 MED ORDER — LIDOCAINE-PRILOCAINE 2.5-2.5 % EX CREA
TOPICAL_CREAM | CUTANEOUS | 0 refills | Status: DC
Start: 2021-10-03 — End: 2021-12-20

## 2021-10-08 NOTE — Progress Notes (Unsigned)
Chronic Care Management Pharmacy Note  10/08/2021 Name:  Tracy [name] MRN:  [id number] DOB:  01-11-1946  Summary: ***  Recommendations/Changes made from today's visit: ***  Plan: ***   Subjective: Tracy [name] is an 76 y.o. year old female who is a primary patient of Tracy [name].  The CCM team was consulted for assistance with disease management and care coordination needs.    {CCMTELEPHONEFACETOFACE:[id number]} for follow up visit in response to provider referral for pharmacy case management and/or care coordination services.   Consent to Services:  The patient was given the following information about Chronic Care Management services today, agreed to services, and gave verbal consent: 1. CCM service includes personalized support from designated clinical staff supervised by the primary care provider, including individualized plan of care and coordination with other care providers 2. 24/7 contact phone numbers for assistance for urgent and routine care needs. 3. Service will only be billed when office clinical staff spend 20 minutes or more in a month to coordinate care. 4. Only one practitioner may furnish and bill the service in a calendar month. 5.The patient may stop CCM services at any time (effective at the end of the month) by phone call to the office staff. 6. The patient will be responsible for cost sharing (co-pay) of up to 20% of the service fee (after annual deductible is met). Patient agreed to services and consent obtained.  Patient Care Team: [name], [name] as PCP - General (Family Medicine) [name], [name] as Attending Physician (Physical Medicine and Rehabilitation) [name], [name] as Consulting Physician (Orthopedic Surgery) [name], [name] as Consulting Physician (Otolaryngology) [name], [name] as Consulting Physician (Gastroenterology) [name], [name] as Consulting Physician (General Surgery) [name], [facility] as Pharmacist  (Pharmacist)  Recent office visits:  09/03/21 [name] [name]- Family Medicine - Neuropathy - Labs were ordered. gabapentin (NEURONTIN) 100 MG capsule and hydrocortisone 2.5 % cream prescribed. Follow up as scheduled.    08/22/21 [name], [name] - Family Medicine - Viral upper respiratory - Continue Azelastin nasal spray. Add Flonase or Nasonex 3-5 days for congestion. Continue Mucinex. Add Coricidin HPB and drink plenty of fluids. Follow up if no improvement.    08/13/21 [name], NP - Family Medicine - Acute upper respiratory infection - benzonatate (TESSALON) 100 MG capsule, dextromethorphan-guaiFENesin (MUCINEX DM) 30-600 MG 12hr tablet, doxycycline (VIBRA-TABS) 100 MG tablet, and oxymetazoline (AFRIN) 0.05 % nasal spray. Given med hx, low threshold for treatment. Will give abx, prednisone, and supportive care as above. Return in 7 days if no improvement.    06/07/21 [name], [name] - Family Medicine - Right flank pain - labs were ordered. CT Renal stone ordered. Follow up as needed.    05/29/21 [name], NP - Family Medicine (Video visit) -  azelastine (ASTELIN) 0.1 % nasal spray and azithromycin (ZITHROMAX) 250 MG tablet prescribed. Follow up as needed,    05/04/21 [name], [name] - Family Medicine - Pain of upper abdomen - dicyclomine (BENTYL) 10 MG capsule, hydrOXYzine (VISTARIL) 25 MG capsule, and sucralfate (CARAFATE) 1 g tablet prescribed. Restart Pantoprazole twice daily. Follow up in 4-6 weeks.      Recent consult visits:  None noted.   Hospital visits: 04/30/21 Medication Reconciliation was completed by comparing discharge summary, patients EMR and Pharmacy list, and upon discussion with patient.   Admitted to the hospital on 04/30/21 due to Abdominal pain. Discharge date was 04/30/21. Discharged from [facility].  New?Medications Started at [facility] Discharge:?? triamcinolone cream (KENALOG) 0.1 % prescribed    Medication Changes  at Hospital Discharge: None noted    Medications Discontinued at Hospital Discharge: None noted   Medications that remain the same after Hospital Discharge:??  All other medications will remain the same.     Hospital visits: 03/16/21-03/19/21 Medication Reconciliation was completed by comparing discharge summary, patients EMR and Pharmacy list, and upon discussion with patient.   Admitted to the hospital on 04/30/21 due to Abdominal pain. Discharge date was 04/30/21. Discharged from [place]?Medications Started at [facility] Discharge:?? None noted   Medication Changes at Hospital Discharge: None noted    Medications Discontinued at Hospital Discharge: None noted   Medications that remain the same after Hospital Discharge:??  All other medications will remain the same.  Objective:  Lab Results  Component Value Date   CREATININE 1.27 (H) 09/14/2021   BUN 13 09/14/2021   GFR 47.97 (L) 09/03/2021   EGFR 71 (L) 07/13/2015   GFRNONAA 44 (L) 09/14/2021   GFRAA 41 (L) 04/21/2020   NA 141 09/14/2021   K 3.9 09/14/2021   CALCIUM 8.9 09/14/2021   CO2 26 09/14/2021   GLUCOSE 138 (H) 09/14/2021    Lab Results  Component Value Date/Time   HGBA1C 7.0 (H) 09/24/2021 10:36 AM   HGBA1C 7.1 (H) 04/10/2021 10:06 AM   GFR 47.97 (L) 09/03/2021 10:57 AM   GFR 33.25 (L) 04/10/2021 10:06 AM    Last diabetic Eye exam:  Lab Results  Component Value Date/Time   HMDIABEYEEXA No Retinopathy 05/20/2019 12:00 AM    Last diabetic Foot exam: No results found for: HMDIABFOOTEX   Lab Results  Component Value Date   CHOL 216 (H) 09/24/2021   HDL 89.00 09/24/2021   LDLCALC 99 09/24/2021   LDLDIRECT 154.7 04/05/2013   TRIG 142.0 09/24/2021   CHOLHDL 2 09/24/2021    Hepatic Function Latest Ref Rng & Units 09/14/2021 09/03/2021 08/17/2021  Total Protein 6.5 - 8.1 g/dL 6.5 6.3 6.8  Albumin 3.5 - 5.0 g/dL 4.1 4.2 4.2  AST 15 - 41 U/L $Remo'18 18 15  'wmplq$ ALT 0 - 44 U/L $Remo'13 14 11   'xCtBG$ Alk Phosphatase 38 - 126 U/L 68 71 73  Total Bilirubin 0.3 - 1.2 mg/dL 0.3 0.5 0.4  Bilirubin, Direct 0.0 - 0.3 mg/dL - 0.1 -    Lab Results  Component Value Date/Time   TSH 0.92 09/03/2021 10:57 AM   TSH 2.23 04/10/2021 10:06 AM    CBC Latest Ref Rng & Units 09/14/2021 09/03/2021 08/17/2021  WBC 4.0 - 10.5 K/uL 4.9 5.6 5.6  Hemoglobin 12.0 - 15.0 g/dL 10.6(L) 11.1(L) 11.1(L)  Hematocrit 36.0 - 46.0 % 32.6(L) 33.7(L) 33.5(L)  Platelets 150 - 400 K/uL 296 246.0 293    Lab Results  Component Value Date/Time   VD25OH 74.79 09/24/2021 10:36 AM   VD25OH 71.18 04/10/2021 10:06 AM    Clinical ASCVD: {YES/NO:21197} The ASCVD Risk score (Arnett DK, et al., 2019) failed to calculate for the following reasons:   The patient has a prior MI or stroke diagnosis    Depression screen [facility] 2/9 09/24/2021 09/03/2021 08/22/2021  Decreased Interest 0 0 0  Down, Depressed, Hopeless 0 0 0  PHQ - 2 Score 0 0 0  Altered sleeping $RemoveBeforeDE'1 3 1  'mbPkfzIDbvYdTDQ$ Tired, decreased energy 0 0 1  Change in appetite 0 0 0  Feeling bad or failure about yourself  0 0 0  Trouble  concentrating 2 0 2  Moving slowly or fidgety/restless 0 0 0  Suicidal thoughts 0 0 0  PHQ-9 Score $RemoveBef'3 3 4  'ehyhSgzaox$ Difficult doing work/chores Not difficult at all Extremely dIfficult Not difficult at all  Some recent data might be hidden     ***Other: (CHADS2VASc if Afib, MMRC or CAT for COPD, ACT, DEXA)  Social History   Tobacco Use  Smoking Status Former   Packs/day: 1.00   Years: 20.00   Pack years: 20.00   Types: Cigarettes   Quit date: 07/30/2011   Years since quitting: 10.2  Smokeless Tobacco Never  Tobacco Comments   Quit >4 years ago; 1 ppd for about 5/20 years (remaining was less)   BP Readings from Last 3 Encounters:  09/24/21 124/70  09/20/21 (!) 164/89  09/14/21 (!) 157/72   Pulse Readings from Last 3 Encounters:  09/24/21 72  09/20/21 85  09/14/21 87   Wt Readings from Last 3 Encounters:  09/24/21 176 lb 12.8 oz (80.2 kg)  09/20/21  179 lb 12 oz (81.5 kg)  09/14/21 178 lb 4 oz (80.9 kg)   BMI Readings from Last 3 Encounters:  09/24/21 35.71 kg/m  09/20/21 36.31 kg/m  09/14/21 36.00 kg/m    Assessment/Interventions: Review of patient past medical history, allergies, medications, health status, including review of consultants reports, laboratory and other test data, was performed as part of comprehensive evaluation and provision of chronic care management services.   SDOH:  (Social Determinants of Health) assessments and interventions performed: {yes/no:20286}  SDOH Screenings   Alcohol Screen: Not on file  Depression (PHQ2-9): Low Risk    PHQ-2 Score: 3  Financial Resource Strain: Not on file  Food Insecurity: Not on file  Housing: Not on file  Physical Activity: Not on file  Social Connections: Not on file  Stress: Not on file  Tobacco Use: Medium Risk   Smoking Tobacco Use: Former   Smokeless Tobacco Use: Never   Passive Exposure: Not on file  Transportation Needs: Not on file    [place]  Allergies  Allergen Reactions   Bacitracin-Neomycin-Polymyxin  [Neomycin-Bacitracin Zn-Polymyx] Swelling   Nsaids Other (See Comments)    nosebleeds   Tape Hives    Pt cannot tolerate bandaids, tape, or any other adhesives.    Ambien [Zolpidem Tartrate]     Hives    Amoxicillin     Rash    Clavulanic Acid Hives   Contrast Media [Iodinated Contrast Media] Hives    Pt states hives with prior ct, was given benadryl to resolve   Latex Swelling   Prednisone Swelling    Pt tolerates Dexamethasone. Throat swelling   Tessalon [Benzonatate] Itching and Rash    Medications Reviewed Today     Reviewed by [name], [name] (Physician) on 09/24/21 at [place] List Status: <None>   Medication Order Taking? Sig Documenting Provider Last Dose Status Informant  atorvastatin (LIPITOR) 10 MG tablet [id number] Yes Take 1 tablet (10 mg total) by mouth daily. [name], [name] Taking Active    azelastine (ASTELIN) 0.1 % nasal spray [id number] Yes Place 1 spray into both nostrils 2 (two) times daily. Use in each nostril as directed [name], NP Taking Active   dexamethasone (DECADRON) 4 MG tablet [id number] Yes Take 3 tabs with breakfast the day after each treatment. [name], [name] Taking Active Self  gabapentin (NEURONTIN) 100 MG capsule [id number] Yes Take 2 capsules (200 mg total) by mouth 3 (three) times  daily. [name], [name] Taking Active   glucose blood (ACCU-CHEK GUIDE) test strip [id number] Yes Use as instructed to check sugars 1-2 times daily. [name], [name] Taking Active   hydrocortisone 2.5 % cream [id number] Yes Apply topically 2 (two) times daily. [name], [name] Taking Active   lidocaine-prilocaine (EMLA) cream [id number] Yes APPLY 1 APPLICATION TOPICALLY AS NEEDED. [name], [name] Taking Active   loratadine (CLARITIN) 10 MG tablet [id number] Yes Take 10 mg by mouth daily. Provider, Historical, [name] Taking Active Self  meclizine (ANTIVERT) 25 MG tablet [id number] Yes Take 1 tablet (25 mg total) by mouth 3 (three) times daily as needed for dizziness. Gardenia Phlegm, NP Taking Active Self           Med Note [facility] Karen Kays   Wed Aug 22, 2021 11:48 AM) PRN  oxymetazoline (AFRIN) 0.05 % nasal spray [id number] Yes Place 1 spray into both nostrils 2 (two) times daily as needed for congestion. Or bloody nose. [name], NP Taking Active   pantoprazole (PROTONIX) 40 MG tablet [id number] Yes TAKE 1 TABLET(40 MG) BY MOUTH TWICE DAILY [name], [name] Taking Active   sodium chloride flush (NS) 0.9 % injection 10 mL [id number]   [name], [name]  Active   tiZANidine (ZANAFLEX) 4 MG tablet [id number] Yes TAKE 1 TABLET(4 MG) BY MOUTH AT BEDTIME [name], [name] Taking Active   traZODone (DESYREL) 100 MG tablet [id number] Yes TAKE 1 TABLET BY MOUTH AT  BEDTIME [name], [name] Taking Active   triamcinolone  cream (KENALOG) 0.1 % [id number] Yes Apply 1 application topically 2 (two) times daily. Tacy Learn, PA-C Taking Active   Vitamin D, Ergocalciferol, (DRISDOL) 1.25 MG (50000 UNIT) CAPS capsule [id number] Yes TAKE 1 CAPSULE BY MOUTH 1  TIME A WEEK [name], [name] Taking Active             Patient Active Problem List   Diagnosis Date Noted   Bone metastases ([place]) 02/08/2021   Angiodysplasia of intestine 08/23/2020   Port-A-Cath in place 08/04/2020   Counseling regarding advance care planning and goals of care 02/07/2020   Multiple myeloma ([place]) 01/25/2020   Dizziness 10/12/2019   Post-nasal drainage 10/12/2019   Allergic rhinitis 08/19/2018   Type 2 diabetes mellitus with diabetic neuropathy, unspecified ([place]) 11/05/2017   Encounter for long-term use of muscle relaxants 09/24/2017   CAD in native artery 09/24/2017   OSA (obstructive sleep apnea) 09/24/2017   Snorings 09/24/2017   Asthenia 06/30/2017   Morbid obesity ([place]) 06/02/2017   Depression 06/02/2017   Iron deficiency anemia 09/23/2016   Anemia of chronic disease 09/28/2015   Genetic testing 08/21/2015   History of left breast cancer 07/13/2015   History of right breast cancer 06/20/2015   Hearing loss due to cerumen impaction 12/29/2014   Allergy to adhesive tape 05/19/2014   Hyperlipidemia 12/06/2013   GERD (gastroesophageal reflux disease) 12/06/2013   Cervical disc disease 11/11/2013   Osteopenia 05/25/2013   Allergic asthma 12/24/2012   Insomnia 04/02/2012   Physical exam, annual 04/02/2012   HTN (hypertension) 02/03/2012   Vertigo, benign positional 02/03/2012   Left groin pain 02/03/2012   Hip pain 10/10/2011   TIA (transient ischemic attack) 07/24/2011   Allergic reaction 07/05/2011   Osteoarthrosis involving lower leg 10/19/2010   Disorder of bone and cartilage 05/01/2010   Generalized anxiety disorder 05/01/2010   Vitamin D deficiency 02/20/2010   Unspecified chronic bronchitis ([place])  08/24/2009  Other lymphedema 10/29/2007    Immunization History  Administered Date(s) Administered   Fluad Quad(high Dose 65+) 05/31/2020   Influenza Split 04/28/2010, 06/24/2011   Influenza Whole 04/22/2013   Influenza, High Dose Seasonal PF 05/30/2016, 05/20/2018, 04/09/2019   Influenza,inj,Quad PF,6+ Mos 04/29/2015, 05/16/2017   Influenza-Unspecified 05/03/2014   Moderna Sars-Covid-2 Vaccination 09/27/2019, 10/25/2019, 06/05/2020   Pneumococcal Conjugate-13 06/01/2015   Pneumococcal Polysaccharide-23 05/10/2014   Td 04/05/2013   Zoster, Live 05/01/2010    Conditions to be addressed/monitored:  HTN, CAD, Type II DM, HLD, Asthma  There are no care plans that you recently modified to display for this patient.    Medication Assistance: {MEDASSISTANCEINFO:25044}  Compliance/Adherence/Medication fill history: Care Gaps: ***  Star-Rating Drugs: ***  Patient's preferred pharmacy is:  [facility], [place] - [address] AT [address] [place] [address] Phone: [phone] Fax: [phone]  [facility] ([place], [place] [facility] [place] [address] [place] [address] Phone: [phone] Fax: [phone]  [facility] [phone] @ [place], [place] [address] Phone: [phone] Fax: [phone]  [facility] ([facility] ) - [place], [place] [address] Phone: [phone] Fax: [phone]  Uses pill box? {Yes or If no, why not?:20788} Pt endorses ***% compliance  We discussed: {Pharmacy options:24294} Patient decided to: {US Pharmacy Plan:23885}  Care Plan and Follow Up Patient Decision:  {FOLLOWUP:24991}  Plan: {CM FOLLOW UP DIYM:41583}  ***  Current  Barriers:  {pharmacybarriers:24917}  Pharmacist Clinical Goal(s):  Patient will {PHARMACYGOALCHOICES:24921} through collaboration with PharmD and provider.   Interventions: 1:1 collaboration with [name], [name] regarding development and update of comprehensive plan of care as evidenced by provider attestation and co-signature Inter-disciplinary care team collaboration (see longitudinal plan of care) Comprehensive medication review performed; medication list updated in electronic medical record  Hypertension (BP goal {CHL HP UPSTREAM Pharmacist BP ranges:[phone]}) -{US controlled/uncontrolled:25276} -Current treatment: *** -Medications previously tried: ***  -Current home readings: *** -Current dietary habits: *** -Current exercise habits: *** -{ACTIONS;DENIES/REPORTS:21021675::"Denies"} hypotensive/hypertensive symptoms -Educated on {CCM BP Counseling:25124} -Counseled to monitor BP at home ***, document, and provide log at future appointments -{CCMPHARMDINTERVENTION:25122}  Hyperlipidemia: (LDL goal < ***) -{US controlled/uncontrolled:25276} -Current treatment: *** -Medications previously tried: ***  -Current dietary patterns: *** -Current exercise habits: *** -Educated on {CCM HLD Counseling:25126} -{CCMPHARMDINTERVENTION:25122}  Diabetes (A1c goal {A1c goals:23924}) -{US controlled/uncontrolled:25276} -Current medications: *** -Medications previously tried: ***  -Current home glucose readings fasting glucose: *** post prandial glucose: *** -{ACTIONS;DENIES/REPORTS:21021675::"Denies"} hypoglycemic/hyperglycemic symptoms -Current meal patterns:  breakfast: ***  lunch: ***  dinner: *** snacks: *** drinks: *** -Current exercise: *** -Educated on {CCM DM COUNSELING:25123} -Counseled to check feet daily and get yearly eye exams -{CCMPHARMDINTERVENTION:25122}  Asthma(Goal: control symptoms and prevent exacerbations) -{US  controlled/uncontrolled:25276} -Current treatment  *** -Medications previously tried: ***  -Gold Grade: {CHL HP Upstream Pharm COPD Gold ENMMH:[id number]} -Current COPD Classification:  {CHL HP Upstream Pharm COPD [phone]} -MMRC/CAT score: *** -Pulmonary function testing: *** -Exacerbations requiring treatment in last 6 months: *** -Patient {Actions; denies-reports:120008} consistent use of maintenance inhaler -Frequency of rescue inhaler use: *** -Counseled on {CCMINHALERCOUNSELING:25121} -{CCMPHARMDINTERVENTION:25122}  *** (Goal: ***) -{US controlled/uncontrolled:25276} -Current treatment  *** -Medications previously tried: ***  -{  CCMPHARMDINTERVENTION:25122}  Patient Goals/Self-Care Activities Patient will:  - {pharmacypatientgoals:24919}  Follow Up Plan: {CM FOLLOW UP XIDH:68616}

## 2021-10-10 ENCOUNTER — Telehealth: Payer: Medicare Other

## 2021-10-11 ENCOUNTER — Other Ambulatory Visit: Payer: Self-pay

## 2021-10-12 ENCOUNTER — Other Ambulatory Visit: Payer: Self-pay | Admitting: Hematology

## 2021-10-12 ENCOUNTER — Other Ambulatory Visit: Payer: Self-pay

## 2021-10-12 ENCOUNTER — Inpatient Hospital Stay: Payer: Medicare Other | Attending: Hematology

## 2021-10-12 ENCOUNTER — Inpatient Hospital Stay: Payer: Medicare Other

## 2021-10-12 ENCOUNTER — Inpatient Hospital Stay (HOSPITAL_BASED_OUTPATIENT_CLINIC_OR_DEPARTMENT_OTHER): Payer: Medicare Other | Admitting: Hematology

## 2021-10-12 VITALS — BP 149/90 | HR 85 | Temp 97.2°F | Resp 20 | Wt 183.0 lb

## 2021-10-12 VITALS — BP 177/75 | HR 88 | Temp 98.1°F | Resp 18

## 2021-10-12 DIAGNOSIS — I252 Old myocardial infarction: Secondary | ICD-10-CM | POA: Diagnosis not present

## 2021-10-12 DIAGNOSIS — Z87891 Personal history of nicotine dependence: Secondary | ICD-10-CM | POA: Insufficient documentation

## 2021-10-12 DIAGNOSIS — C9000 Multiple myeloma not having achieved remission: Secondary | ICD-10-CM | POA: Diagnosis not present

## 2021-10-12 DIAGNOSIS — E538 Deficiency of other specified B group vitamins: Secondary | ICD-10-CM | POA: Insufficient documentation

## 2021-10-12 DIAGNOSIS — N189 Chronic kidney disease, unspecified: Secondary | ICD-10-CM | POA: Insufficient documentation

## 2021-10-12 DIAGNOSIS — Z7189 Other specified counseling: Secondary | ICD-10-CM

## 2021-10-12 DIAGNOSIS — Z5112 Encounter for antineoplastic immunotherapy: Secondary | ICD-10-CM | POA: Diagnosis not present

## 2021-10-12 DIAGNOSIS — E1122 Type 2 diabetes mellitus with diabetic chronic kidney disease: Secondary | ICD-10-CM | POA: Diagnosis not present

## 2021-10-12 DIAGNOSIS — E1140 Type 2 diabetes mellitus with diabetic neuropathy, unspecified: Secondary | ICD-10-CM | POA: Insufficient documentation

## 2021-10-12 DIAGNOSIS — Z8673 Personal history of transient ischemic attack (TIA), and cerebral infarction without residual deficits: Secondary | ICD-10-CM | POA: Insufficient documentation

## 2021-10-12 DIAGNOSIS — C7951 Secondary malignant neoplasm of bone: Secondary | ICD-10-CM

## 2021-10-12 DIAGNOSIS — Z79899 Other long term (current) drug therapy: Secondary | ICD-10-CM | POA: Insufficient documentation

## 2021-10-12 DIAGNOSIS — Z923 Personal history of irradiation: Secondary | ICD-10-CM | POA: Diagnosis not present

## 2021-10-12 DIAGNOSIS — D509 Iron deficiency anemia, unspecified: Secondary | ICD-10-CM | POA: Insufficient documentation

## 2021-10-12 DIAGNOSIS — Z853 Personal history of malignant neoplasm of breast: Secondary | ICD-10-CM | POA: Insufficient documentation

## 2021-10-12 DIAGNOSIS — Z95828 Presence of other vascular implants and grafts: Secondary | ICD-10-CM

## 2021-10-12 LAB — CMP (CANCER CENTER ONLY)
ALT: 11 U/L (ref 0–44)
AST: 13 U/L — ABNORMAL LOW (ref 15–41)
Albumin: 3.9 g/dL (ref 3.5–5.0)
Alkaline Phosphatase: 71 U/L (ref 38–126)
Anion gap: 7 (ref 5–15)
BUN: 10 mg/dL (ref 8–23)
CO2: 26 mmol/L (ref 22–32)
Calcium: 8.9 mg/dL (ref 8.9–10.3)
Chloride: 109 mmol/L (ref 98–111)
Creatinine: 1.31 mg/dL — ABNORMAL HIGH (ref 0.44–1.00)
GFR, Estimated: 42 mL/min — ABNORMAL LOW (ref >60)
Glucose, Bld: 135 mg/dL — ABNORMAL HIGH (ref 70–99)
Potassium: 4 mmol/L (ref 3.5–5.1)
Sodium: 142 mmol/L (ref 135–145)
Total Bilirubin: 0.3 mg/dL (ref 0.3–1.2)
Total Protein: 6.4 g/dL — ABNORMAL LOW (ref 6.5–8.1)

## 2021-10-12 LAB — CBC WITH DIFFERENTIAL (CANCER CENTER ONLY)
Abs Immature Granulocytes: 0.01 10*3/uL (ref 0.00–0.07)
Basophils Absolute: 0 10*3/uL (ref 0.0–0.1)
Basophils Relative: 1 %
Eosinophils Absolute: 0.1 10*3/uL (ref 0.0–0.5)
Eosinophils Relative: 1 %
HCT: 29.4 % — ABNORMAL LOW (ref 36.0–46.0)
Hemoglobin: 10 g/dL — ABNORMAL LOW (ref 12.0–15.0)
Immature Granulocytes: 0 %
Lymphocytes Relative: 35 %
Lymphs Abs: 1.8 10*3/uL (ref 0.7–4.0)
MCH: 30.6 pg (ref 26.0–34.0)
MCHC: 34 g/dL (ref 30.0–36.0)
MCV: 89.9 fL (ref 80.0–100.0)
Monocytes Absolute: 0.3 10*3/uL (ref 0.1–1.0)
Monocytes Relative: 5 %
Neutro Abs: 3.1 10*3/uL (ref 1.7–7.7)
Neutrophils Relative %: 58 %
Platelet Count: 244 10*3/uL (ref 150–400)
RBC: 3.27 MIL/uL — ABNORMAL LOW (ref 3.87–5.11)
RDW: 13.2 % (ref 11.5–15.5)
WBC Count: 5.3 10*3/uL (ref 4.0–10.5)
nRBC: 0 % (ref 0.0–0.2)

## 2021-10-12 MED ORDER — SODIUM CHLORIDE 0.9% FLUSH
10.0000 mL | INTRAVENOUS | Status: AC | PRN
  Administered 2021-10-12: 10 mL

## 2021-10-12 MED ORDER — SODIUM CHLORIDE 0.9 % IV SOLN
20.0000 mg | Freq: Once | INTRAVENOUS | Status: AC
  Administered 2021-10-12: 20 mg via INTRAVENOUS
  Filled 2021-10-12: qty 20

## 2021-10-12 MED ORDER — ACETAMINOPHEN 325 MG PO TABS
650.0000 mg | ORAL_TABLET | Freq: Once | ORAL | Status: AC
  Administered 2021-10-12: 650 mg via ORAL
  Filled 2021-10-12: qty 2

## 2021-10-12 MED ORDER — FAMOTIDINE IN NACL 20-0.9 MG/50ML-% IV SOLN
20.0000 mg | Freq: Once | INTRAVENOUS | Status: AC
  Administered 2021-10-12: 20 mg via INTRAVENOUS
  Filled 2021-10-12: qty 50

## 2021-10-12 MED ORDER — DIPHENHYDRAMINE HCL 25 MG PO CAPS
50.0000 mg | ORAL_CAPSULE | Freq: Once | ORAL | Status: AC
  Administered 2021-10-12: 50 mg via ORAL
  Filled 2021-10-12: qty 2

## 2021-10-12 MED ORDER — ONDANSETRON HCL 8 MG PO TABS
4.0000 mg | ORAL_TABLET | Freq: Once | ORAL | Status: AC
  Administered 2021-10-12: 4 mg via ORAL
  Filled 2021-10-12: qty 1

## 2021-10-12 MED ORDER — SODIUM CHLORIDE 0.9 % IV SOLN: Freq: Once | INTRAVENOUS | Status: AC

## 2021-10-12 MED ORDER — SODIUM CHLORIDE 0.9 % IV SOLN
16.0000 mg/kg | Freq: Once | INTRAVENOUS | Status: AC
  Administered 2021-10-12: 1400 mg via INTRAVENOUS
  Filled 2021-10-12: qty 60

## 2021-10-12 MED ORDER — SODIUM CHLORIDE 0.9% FLUSH
10.0000 mL | INTRAVENOUS | Status: DC | PRN
  Administered 2021-10-12: 10 mL

## 2021-10-12 MED ORDER — CYANOCOBALAMIN 1000 MCG/ML IJ SOLN
1000.0000 ug | Freq: Once | INTRAMUSCULAR | Status: AC
  Administered 2021-10-12: 1000 ug via SUBCUTANEOUS
  Filled 2021-10-12: qty 1

## 2021-10-12 MED ORDER — HEPARIN SOD (PORK) LOCK FLUSH 100 UNIT/ML IV SOLN
500.0000 [IU] | Freq: Once | INTRAVENOUS | Status: AC | PRN
  Administered 2021-10-12: 500 [IU]

## 2021-10-12 NOTE — Progress Notes (Signed)
Pt informed RN that she was experiencing nausea w/out vomiting and requested something to help with it. ?RN notified MD.  ?RN received V/O from Dr. Irene Limbo for 4 mg Zofran PO to be given in inf today.  ?

## 2021-10-12 NOTE — Patient Instructions (Addendum)
[facility]  Discharge Instructions: ?Thank you for choosing [facility] to provide your oncology and hematology care.  ? ?If you have a lab appointment with the [facility], please go directly to the [facility] and check in at the registration area. ?  ?Wear comfortable clothing and clothing appropriate for easy access to any Portacath or PICC line.  ? ?We strive to give you quality time with your provider. You may need to reschedule your appointment if you arrive late (15 or more minutes).  Arriving late affects you and other patients whose appointments are after yours.  Also, if you miss three or more appointments without notifying the office, you may be dismissed from the clinic at the provider?s discretion.    ?  ?For prescription refill requests, have your pharmacy contact our office and allow 72 hours for refills to be completed.   ? ?Today you received the following chemotherapy and/or immunotherapy agents: Darzalex  ?  ?To help prevent nausea and vomiting after your treatment, we encourage you to take your nausea medication as directed. ? ?BELOW ARE SYMPTOMS THAT SHOULD BE REPORTED IMMEDIATELY: ?*FEVER GREATER THAN 100.4 F (38 ?C) OR HIGHER ?*CHILLS OR SWEATING ?*NAUSEA AND VOMITING THAT IS NOT CONTROLLED WITH YOUR NAUSEA MEDICATION ?*UNUSUAL SHORTNESS OF BREATH ?*UNUSUAL BRUISING OR BLEEDING ?*URINARY PROBLEMS (pain or burning when urinating, or frequent urination) ?*BOWEL PROBLEMS (unusual diarrhea, constipation, pain near the anus) ?TENDERNESS IN MOUTH AND THROAT WITH OR WITHOUT PRESENCE OF ULCERS (sore throat, sores in mouth, or a toothache) ?UNUSUAL RASH, SWELLING OR PAIN  ?UNUSUAL VAGINAL DISCHARGE OR ITCHING  ? ?Items with * indicate a potential emergency and should be followed up as soon as possible or go to the Emergency Department if any problems should occur. ? ?Please show the CHEMOTHERAPY ALERT CARD or IMMUNOTHERAPY ALERT CARD at check-in to the  Emergency Department and triage nurse. ? ?Should you have questions after your visit or need to cancel or reschedule your appointment, please contact [facility]  Dept: [phone]  and follow the prompts.  Office hours are 8:00 a.m. to 4:30 p.m. Monday - Friday. Please note that voicemails left after 4:00 p.m. may not be returned until the following business day.  We are closed weekends and major holidays. You have access to a nurse at all times for urgent questions. Please call the main number to the clinic Dept: [phone] and follow the prompts. ? ? ?For any non-urgent questions, you may also contact your provider using MyChart. We now offer e-Visits for anyone 82 and older to request care online for non-urgent symptoms. For details visit [email or web address]. ?  ?Also download the MyChart app! Go to the app store, search "MyChart", open the app, select [facility], and log in with your MyChart username and password. ? ?Due to Covid, a mask is required upon entering the hospital/clinic. If you do not have a mask, one will be given to you upon arrival. For doctor visits, patients may have 1 support person aged 1 or older with them. For treatment visits, patients cannot have anyone with them due to current Covid guidelines and our immunocompromised population.  ? ?Vitamin B12 Injection ?What is this medication? ?Vitamin B12 (VAHY tuh min B12) prevents and treats low vitamin B12 levels in your body. It is used in people who do not get enough vitamin B12 from their diet or when their digestive tract does not absorb enough. Vitamin B12 plays an  important role in maintaining the health of your nervous system and red blood cells. ?This medicine may be used for other purposes; ask your health care provider or pharmacist if you have questions. ?COMMON BRAND NAME(S): B-12 Compliance Kit, B-12 Injection Kit, Cyomin, Dodex, LA-12, Nutri-Twelve, Physicians EZ Use B-12, Primabalt ?What  should I tell my care team before I take this medication? ?They need to know if you have any of these conditions: ?Kidney disease ?Leber's disease ?Megaloblastic anemia ?An unusual or allergic reaction to cyanocobalamin, cobalt, other medications, foods, dyes, or preservatives ?Pregnant or trying to get pregnant ?Breast-feeding ?How should I use this medication? ?This medication is injected into a muscle or deeply under the skin. It is usually given in a clinic or care team's office. However, your care team may teach you how to inject yourself. Follow all instructions. ?Talk to your care team about the use of this medication in children. Special care may be needed. ?Overdosage: If you think you have taken too much of this medicine contact a poison control center or emergency room at once. ?NOTE: This medicine is only for you. Do not share this medicine with others. ?What if I miss a dose? ?If you are given your dose at a clinic or care team's office, call to reschedule your appointment. If you give your own injections, and you miss a dose, take it as soon as you can. If it is almost time for your next dose, take only that dose. Do not take double or extra doses. ?What may interact with this medication? ?Colchicine ?Heavy alcohol intake ?This list may not describe all possible interactions. Give your health care provider a list of all the medicines, herbs, non-prescription drugs, or dietary supplements you use. Also tell them if you smoke, drink alcohol, or use illegal drugs. Some items may interact with your medicine. ?What should I watch for while using this medication? ?Visit your care team regularly. You may need blood work done while you are taking this medication. ?You may need to follow a special diet. Talk to your care team. Limit your alcohol intake and avoid smoking to get the best benefit. ?What side effects may I notice from receiving this medication? ?Side effects that you should report to your care team  as soon as possible: ?Allergic reactions--skin rash, itching, hives, swelling of the face, lips, tongue, or throat ?Swelling of the ankles, hands, or feet ?Trouble breathing ?Side effects that usually do not require medical attention (report to your care team if they continue or are bothersome): ?Diarrhea ?This list may not describe all possible side effects. Call your doctor for medical advice about side effects. You may report side effects to FDA at 1-800-FDA-1088. ?Where should I keep my medication? ?Keep out of the reach of children. ?Store at room temperature between 15 and 30 degrees C (59 and 85 degrees F). Protect from light. Throw away any unused medication after the expiration date. ?NOTE: This sheet is a summary. It may not cover all possible information. If you have questions about this medicine, talk to your doctor, pharmacist, or health care provider. ?? 2022 Elsevier/Gold Standard (2020-09-27 00:00:00) ? ?

## 2021-10-12 NOTE — Progress Notes (Signed)
Pt requested to have B-12 injection not given her in her arm d/t shoulder pain that started after her last injection.  ?RN confirmed w/ [place] in pharmacy that an alternate site could be used for injection.  ?

## 2021-10-16 ENCOUNTER — Telehealth: Payer: Self-pay | Admitting: Hematology

## 2021-10-16 ENCOUNTER — Other Ambulatory Visit: Payer: Self-pay | Admitting: Hematology

## 2021-10-16 LAB — MULTIPLE MYELOMA PANEL, SERUM
Albumin SerPl Elph-Mcnc: 3.3 g/dL (ref 2.9–4.4)
Albumin/Glob SerPl: 1.3 (ref 0.7–1.7)
Alpha 1: 0.2 g/dL (ref 0.0–0.4)
Alpha2 Glob SerPl Elph-Mcnc: 1.2 g/dL — ABNORMAL HIGH (ref 0.4–1.0)
B-Globulin SerPl Elph-Mcnc: 0.9 g/dL (ref 0.7–1.3)
Gamma Glob SerPl Elph-Mcnc: 0.4 g/dL (ref 0.4–1.8)
Globulin, Total: 2.7 g/dL (ref 2.2–3.9)
IgA: 102 mg/dL (ref 64–422)
IgG (Immunoglobin G), Serum: 446 mg/dL — ABNORMAL LOW (ref 586–1602)
IgM (Immunoglobulin M), Srm: 44 mg/dL (ref 26–217)
M Protein SerPl Elph-Mcnc: 0.1 g/dL — ABNORMAL HIGH
Total Protein ELP: 6 g/dL (ref 6.0–8.5)

## 2021-10-16 NOTE — Telephone Encounter (Signed)
Scheduled follow-up appointments per 3/17 los. Patient is aware. Mailed calendar. ?

## 2021-10-19 ENCOUNTER — Encounter: Payer: Self-pay | Admitting: Hematology

## 2021-10-19 NOTE — Progress Notes (Signed)
[facility] Follow up: ?  ?DOS .10/12/2021 ? ?[name], MD ?[phone] ?[place] [address] ? ? ?CC: ?Follow-up for continued evaluation and management of multiple myeloma ? ?SUMMARY OF ONCOLOGIC HISTORY: ?Oncology History  ?History of right breast cancer  ?04/16/2000 Surgery  ? Left breast: Triple negative  invasive ductal cancer treated with lumpectomy, adjuvant chemotherapy, radiation , in [place], unknown stage ?  ?06/07/2015 Mammogram  ? Right breast mass 6x 6 x 5 mm, right axillary lymph node with slight cortex thickening measured 5 mm  ?  ?06/13/2015 Initial Diagnosis  ? Right breast needle biopsy: Invasive ductal carcinoma, grade 1, right axillary lymph node biopsy negative , ER 95%, PR 5%, Ki-67 10%, HER-2 negative ?  ?06/13/2015 Clinical Stage  ? Stage IA: T1b N0 ?  ?07/07/2015 Surgery  ? Right lumpectomy: Invasive ductal carcinoma grade 1, 1 cm span, with low-grade DCIS, DCIS focally 0.1 cm to inferior margin, 0/3 lymph nodes negative, ER 95%, PR 5%, HER-2 negative ratio 1.1, Ki-67 10% ?  ?07/07/2015 Pathologic Stage  ? Stage IA: T1c N0 ?  ?07/13/2015 Procedure  ? Breast High/Moderate Risk Panel reveals no clinically significant variant at ATM, BRCA1, BRCA2, CDH1, CHEK2, PALB2, PTEN, and TP53.   ?  ?08/23/2015 - 09/21/2015 Radiation Therapy  ? Adjuvant Radiation: Right breast/ 42.5Gy at 2.5 Gy per fraction x 17 fractions.   Right breast boost/ 7.5 Gy at 2.5 Gy per fraction x 3 fractions ?  ? Anti-estrogen oral therapy  ? Patient refused antiestrogen therapy ?  ?10/20/2015 Survivorship  ? Survivorship care plan completed and mailed to patient in lieu of in person visit at her request ?  ?Iron deficiency anemia  ?Multiple myeloma ([facility])  ?01/25/2020 Initial Diagnosis  ? Multiple myeloma ([facility]) ?  ?02/11/2020 - 05/19/2020 Adjuvant Chemotherapy  ? Daratumumab Weekly  ?  ?02/29/2020 - 03/13/2020 Radiation Therapy  ? The targets were treated to a total dose of 20 Gy in 10  fractions of 2 Gy each to the left hip and L5 using one plan. ?  ?06/02/2020 - 10/05/2020 Adjuvant Chemotherapy  ? Daratumumab and Carfilzomib ?  ?10/19/2020 -  Adjuvant Chemotherapy  ? Maintenance Daratumumab--initially every 2 weeks, then every 4 weeks.  ?  ?Bone metastases ([facility])  ?02/08/2021 Initial Diagnosis  ? Bone metastases ([facility]) ?  ? ? ?CURRENT THERAPY: Daratumumab/B12 injection ?Pamidronate (on hold for dental procedure) ? ?INTERVAL HISTORY: ? ?[facility] for continued valuation and management of her multiple myeloma . ?She reports no acute new symptoms. ?Reports no new toxicities from her monthly daratumumab at this tim ?No focal bone pains.  No overt GI bleeding. ?Labs show CBC with a hemoglobin of 10 WBC count of 5.8k and platelets of 244k ?CMP stable ?Myeloma panel M spike of 0.1 g/dL ? ? ? ?Patient Active Problem List  ? Diagnosis Date Noted  ? Bone metastases ([facility]) 02/08/2021  ? Angiodysplasia of intestine 08/23/2020  ? Port-A-Cath in place 08/04/2020  ? Counseling regarding advance care planning and goals of care 02/07/2020  ? Multiple myeloma ([facility]) 01/25/2020  ? Dizziness 10/12/2019  ? Post-nasal drainage 10/12/2019  ? Allergic rhinitis 08/19/2018  ? Type 2 diabetes mellitus with diabetic neuropathy, unspecified ([facility]) 11/05/2017  ? Encounter for long-term use of muscle relaxants 09/24/2017  ? CAD in native artery 09/24/2017  ? OSA (obstructive sleep apnea) 09/24/2017  ? Snorings 09/24/2017  ? Asthenia 06/30/2017  ? Morbid obesity ([facility]) 06/02/2017  ?  Depression 06/02/2017  ? Iron deficiency anemia 09/23/2016  ? Anemia of chronic disease 09/28/2015  ? Genetic testing 08/21/2015  ? History of left breast cancer 07/13/2015  ? History of right breast cancer 06/20/2015  ? Hearing loss due to cerumen impaction 12/29/2014  ? Allergy to adhesive tape 05/19/2014  ? Hyperlipidemia 12/06/2013  ? GERD (gastroesophageal reflux disease) 12/06/2013  ? Cervical disc disease 11/11/2013  ? Osteopenia 05/25/2013  ?  Allergic asthma 12/24/2012  ? Insomnia 04/02/2012  ? Physical exam, annual 04/02/2012  ? HTN (hypertension) 02/03/2012  ? Vertigo, benign positional 02/03/2012  ? Left groin pain 02/03/2012  ? Hip pain 10/10/2011  ? TIA (transient ischemic attack) 07/24/2011  ? Allergic reaction 07/05/2011  ? Osteoarthrosis involving lower leg 10/19/2010  ? Disorder of bone and cartilage 05/01/2010  ? Generalized anxiety disorder 05/01/2010  ? Vitamin D deficiency 02/20/2010  ? Unspecified chronic bronchitis ([facility]) 08/24/2009  ? Other lymphedema 10/29/2007  ? ? ?is allergic to bacitracin-neomycin-polymyxin  [neomycin-bacitracin zn-polymyx], nsaids, tape, ambien [zolpidem tartrate], amoxicillin, clavulanic acid, contrast media [iodinated contrast media], latex, prednisone, and tessalon [benzonatate]. ? ?MEDICAL HISTORY: ?Past Medical History:  ?Diagnosis Date  ? Angiodysplasia of intestine 08/23/2020  ? Anxiety   ? Arthritis   ? Breast cancer ([place] [facility]) 06/13/15  ? Cancer ([facility]) 2000  ? breast cancer  ? Chronic bronchitis ([facility])   ? Chronic bronchitis ([facility])   ? Hyperlipidemia   ? Hypertension   ? Myocardial infarction [facility]) 2001  ? Personal history of radiation therapy   ? Restless leg   ? Stroke [facility]) 2004  ? TIA, no deficits  ? ? ?SURGICAL HISTORY: ?Past Surgical History:  ?Procedure Laterality Date  ? ABDOMINAL HYSTERECTOMY  1985  ? BREAST LUMPECTOMY Left 2000  ? radiation and chemo  ? BREAST LUMPECTOMY Right 2016  ? radiation  ? BREAST SURGERY  2001  ? lt breast lumpectomy  ? COLONOSCOPY WITH ESOPHAGOGASTRODUODENOSCOPY (EGD)  04/2020  ? ENTEROSCOPY N/A 03/15/2021  ? Procedure: ENTEROSCOPY;  Surgeon: Milus Banister, MD;  Location: [facility];  Service: Endoscopy;  Laterality: N/A;  ? GIVENS CAPSULE STUDY  07/2020  ? HOT HEMOSTASIS N/A 03/15/2021  ? Procedure: HOT HEMOSTASIS (ARGON PLASMA COAGULATION/BICAP);  Surgeon: Milus Banister, MD;  Location: [facility];  Service: Endoscopy;  Laterality: N/A;  ? IR IMAGING GUIDED PORT  INSERTION  04/25/2020  ? RADIOACTIVE SEED GUIDED PARTIAL MASTECTOMY WITH AXILLARY SENTINEL LYMPH NODE BIOPSY Right 07/07/2015  ? Procedure: RIGHT RADIOACTIVE SEED GUIDED PARTIAL MASTECTOMY WITH AXILLARY SENTINEL LYMPH NODE BIOPSY;  Surgeon: Autumn Messing III, MD;  Location: [facility];  Service: General;  Laterality: Right;  ? SMALL INTESTINE SURGERY    ? TUBAL LIGATION    ? UPPER GASTROINTESTINAL ENDOSCOPY    ? ? ?SOCIAL HISTORY: ?Social History  ? ?Socioeconomic History  ? Marital status: Divorced  ?  Spouse name: Not on file  ? Number of children: 7  ? Years of education: Not on file  ? Highest education level: Not on file  ?Occupational History  ? Occupation: retired  ?Tobacco Use  ? Smoking status: Former  ?  Packs/day: 1.00  ?  Years: 20.00  ?  Pack years: 20.00  ?  Types: Cigarettes  ?  Quit date: 07/30/2011  ?  Years since quitting: 10.2  ? Smokeless tobacco: Never  ? Tobacco comments:  ?  Quit >4 years ago; 1 ppd for about 5/20 years (remaining was less)  ?Vaping Use  ? Vaping  Use: Former  ?Substance and Sexual Activity  ? Alcohol use: No  ?  Alcohol/week: 0.0 standard drinks  ? Drug use: No  ? Sexual activity: Not Currently  ?Other Topics Concern  ? Not on file  ?Social History Narrative  ? Lives alone.  Retired.  Education:  11th grade GED.  Children:  7 (one here).   ? ?Social Determinants of Health  ? ?Financial Resource Strain: Not on file  ?Food Insecurity: Not on file  ?Transportation Needs: Not on file  ?Physical Activity: Not on file  ?Stress: Not on file  ?Social Connections: Not on file  ?Intimate Partner Violence: Not on file  ? ? ?FAMILY HISTORY: ?Family History  ?Problem Relation Age of Onset  ? Emphysema Mother 40  ?     smoker  ? Diabetes Father   ? Lung cancer Sister   ?     dx. <50; former smoker  ? Diabetes Brother   ? Diabetes Brother   ? Brain cancer Brother 35  ?     unknown tumor type  ? Diabetes Paternal Aunt   ? Stroke Maternal Grandmother   ? Diabetes Paternal Grandmother    ? Cancer Daughter 88  ?     neck cancer  ? Other Daughter   ?     hysterectomy for unspecified reason  ? Colon cancer Daughter   ? Breast cancer Cousin   ? Cancer Cousin   ?     unspecified type  ? Breast cancer Other

## 2021-10-28 ENCOUNTER — Encounter: Payer: Self-pay | Admitting: Hematology

## 2021-10-29 ENCOUNTER — Other Ambulatory Visit: Payer: Self-pay | Admitting: Hematology

## 2021-11-02 ENCOUNTER — Other Ambulatory Visit: Payer: Self-pay | Admitting: Hematology

## 2021-11-07 ENCOUNTER — Encounter: Payer: Self-pay | Admitting: Registered Nurse

## 2021-11-07 ENCOUNTER — Other Ambulatory Visit: Payer: Self-pay

## 2021-11-07 ENCOUNTER — Ambulatory Visit (INDEPENDENT_AMBULATORY_CARE_PROVIDER_SITE_OTHER): Payer: Medicare Other | Admitting: Registered Nurse

## 2021-11-07 VITALS — BP 121/60 | HR 90 | Temp 98.0°F | Resp 17 | Ht 59.0 in | Wt 185.4 lb

## 2021-11-07 DIAGNOSIS — S161XXA Strain of muscle, fascia and tendon at neck level, initial encounter: Secondary | ICD-10-CM

## 2021-11-07 MED ORDER — CYCLOBENZAPRINE HCL 5 MG PO TABS
5.0000 mg | ORAL_TABLET | Freq: Two times a day (BID) | ORAL | 1 refills | Status: DC | PRN
Start: 2021-11-07 — End: 2022-06-12

## 2021-11-07 NOTE — Progress Notes (Signed)
? ?Acute Office Visit ? ?Subjective:  ? ? Patient ID: Tracy [name], female    DOB: 1946/02/10, 76 y.o.   MRN: [id number] ? ?Chief Complaint  ?Patient presents with  ? Pain  ?  Patient states she has been having some right shoulder and neck pain. Patient states feels like a vein has popped as well.  ? ? ?HPI ?Patient is in today for pain ? ?Neck and R shoulder pain ?Acute onset, no injury or trauma noted. ?Feels like a vein has popped. Seems like there is a "rise" on the R side.  ?No headaches, visual changes ?Has not taken medication for this ?No radiculopathy or myelopathy in RUE ? ?Outpatient Medications Prior to Visit  ?Medication Sig Dispense Refill  ? atorvastatin (LIPITOR) 10 MG tablet Take 1 tablet (10 mg total) by mouth daily. 90 tablet 1  ? azelastine (ASTELIN) 0.1 % nasal spray Place 1 spray into both nostrils 2 (two) times daily. Use in each nostril as directed 30 mL 12  ? dexamethasone (DECADRON) 4 MG tablet Take 3 tabs with breakfast the day after each treatment. 21 tablet 1  ? gabapentin (NEURONTIN) 100 MG capsule Take 2 capsules (200 mg total) by mouth 3 (three) times daily. 180 capsule 3  ? glucose blood (ACCU-CHEK GUIDE) test strip Use as instructed to check sugars 1-2 times daily. 100 each 12  ? hydrocortisone 2.5 % cream Apply topically 2 (two) times daily. 30 g 1  ? lidocaine-prilocaine (EMLA) cream APPLY TOPICALLY TO THE AFFECTED AREA AS NEEDED 30 g 0  ? loratadine (CLARITIN) 10 MG tablet Take 10 mg by mouth daily.    ? meclizine (ANTIVERT) 25 MG tablet Take 1 tablet (25 mg total) by mouth 3 (three) times daily as needed for dizziness. 30 tablet 0  ? oxymetazoline (AFRIN) 0.05 % nasal spray Place 1 spray into both nostrils 2 (two) times daily as needed for congestion. Or bloody nose. 30 mL 0  ? pantoprazole (PROTONIX) 40 MG tablet TAKE 1 TABLET(40 MG) BY MOUTH TWICE DAILY 180 tablet 0  ? traZODone (DESYREL) 100 MG tablet TAKE 1 TABLET BY MOUTH AT  BEDTIME 90 tablet 3  ? triamcinolone cream  (KENALOG) 0.1 % Apply 1 application topically 2 (two) times daily. 30 g 0  ? Vitamin D, Ergocalciferol, (DRISDOL) 1.25 MG (50000 UNIT) CAPS capsule TAKE 1 CAPSULE BY MOUTH 1  TIME A WEEK 13 capsule 3  ? tiZANidine (ZANAFLEX) 4 MG tablet TAKE 1 TABLET(4 MG) BY MOUTH AT BEDTIME 90 tablet 1  ? ?Facility-Administered Medications Prior to Visit  ?Medication Dose Route Frequency Provider Last Rate Last Admin  ? sodium chloride flush (NS) 0.9 % injection 10 mL  10 mL Intracatheter PRN [name], MD   10 mL at 08/17/21 1553  ? ? ?Review of Systems ?Per hpi  ? ?   ?Objective:  ?  ?BP 121/60   Pulse 90   Temp 98 ?F (36.7 ?C) (Temporal)   Resp 17   Ht '4\' 11"'$  (1.499 m)   Wt 185 lb 6.4 oz (84.1 kg)   SpO2 100%   BMI 37.45 kg/m?  ?Physical Exam ?Vitals and nursing note reviewed.  ?Constitutional:   ?   General: She is not in acute distress. ?   Appearance: Normal appearance. She is normal weight. She is not ill-appearing, toxic-appearing or diaphoretic.  ?Cardiovascular:  ?   Rate and Rhythm: Normal rate and regular rhythm.  ?   Heart sounds: Normal heart sounds.  No murmur heard. ?  No friction rub. No gallop.  ?Pulmonary:  ?   Effort: Pulmonary effort is normal. No respiratory distress.  ?   Breath sounds: Normal breath sounds. No stridor. No wheezing, rhonchi or rales.  ?Chest:  ?   Chest wall: No tenderness.  ?Musculoskeletal:     ?   General: Tenderness (R trapezius) present. No swelling, deformity or signs of injury. Normal range of motion.  ?   Right lower leg: No edema.  ?   Left lower leg: No edema.  ?Skin: ?   General: Skin is warm and dry.  ?Neurological:  ?   General: No focal deficit present.  ?   Mental Status: She is alert and oriented to person, place, and time. Mental status is at baseline.  ?   Cranial Nerves: No cranial nerve deficit.  ?   Sensory: No sensory deficit.  ?   Motor: No weakness.  ?   Coordination: Coordination normal.  ?   Gait: Gait normal.  ?   Deep Tendon Reflexes: Reflexes  normal.  ?Psychiatric:     ?   Mood and Affect: Mood normal.     ?   Behavior: Behavior normal.     ?   Thought Content: Thought content normal.     ?   Judgment: Judgment normal.  ? ? ?No results found for any visits on 11/07/21. ? ? ?   ?Assessment & Plan:  ?1. Strain of cervical portion of right trapezius muscle ?- cyclobenzaprine (FLEXERIL) 5 MG tablet; Take 1 tablet (5 mg total) by mouth 2 (two) times daily as needed for muscle spasms.  Dispense: 30 tablet; Refill: 1 ? ? ? ?Meds ordered this encounter  ?Medications  ? cyclobenzaprine (FLEXERIL) 5 MG tablet  ?  Sig: Take 1 tablet (5 mg total) by mouth 2 (two) times daily as needed for muscle spasms.  ?  Dispense:  30 tablet  ?  Refill:  1  ?  Order Specific Question:   Supervising Provider  ?  Answer:   Carlota Raspberry, JEFFREY R [[id number]]  ? ? ?Return if symptoms worsen or fail to improve. ? ?PLAN ?Flexeril '5mg'$  po bid prn. Reviewed risks, benefits, and side effects, pt voices understanding. ?Reviewed stretching for upper back, neck, and chest. ?Will follow up in one week if worsening or failing to improve. ?Patient encouraged to call clinic with any questions, comments, or concerns. ? ? ?[name], NP ?

## 2021-11-07 NOTE — Patient Instructions (Addendum)
Tracy [name] - ? ?Great to see you! ? ?Call with any concerns.  ? ?Flexeril twice daily as needed - use with caution as it may make you a little sleepy. ? ?Stretch a few times daily for the chest, upper back, and neck. ? ?Call if worsening or failing to improve ? ?Thank you, ? ?Rich  ? ? ? ?If you have lab work done today you will be contacted with your lab results within the next 2 weeks.  If you have not heard from [place] then please contact us. The fastest way to get your results is to register for My Chart. ? ? ?IF you received an x-ray today, you will receive an invoice from [facility]. Please contact [facility] at [phone] with questions or concerns regarding your invoice.  ? ?IF you received labwork today, you will receive an invoice from [facility]. Please contact [facility] at [phone] with questions or concerns regarding your invoice.  ? ?Our billing staff will not be able to assist you with questions regarding bills from these companies. ? ?You will be contacted with the lab results as soon as they are available. The fastest way to get your results is to activate your My Chart account. Instructions are located on the last page of this paperwork. If you have not heard from [place] regarding the results in 2 weeks, please contact this office. ?  ? ? ?

## 2021-11-09 ENCOUNTER — Inpatient Hospital Stay: Payer: Medicare Other | Attending: Hematology

## 2021-11-09 ENCOUNTER — Other Ambulatory Visit: Payer: Self-pay

## 2021-11-09 ENCOUNTER — Inpatient Hospital Stay: Payer: Medicare Other

## 2021-11-09 VITALS — BP 172/79 | HR 95 | Temp 98.4°F | Resp 18

## 2021-11-09 DIAGNOSIS — Z79899 Other long term (current) drug therapy: Secondary | ICD-10-CM | POA: Diagnosis not present

## 2021-11-09 DIAGNOSIS — D509 Iron deficiency anemia, unspecified: Secondary | ICD-10-CM | POA: Insufficient documentation

## 2021-11-09 DIAGNOSIS — C7951 Secondary malignant neoplasm of bone: Secondary | ICD-10-CM

## 2021-11-09 DIAGNOSIS — Z923 Personal history of irradiation: Secondary | ICD-10-CM | POA: Diagnosis not present

## 2021-11-09 DIAGNOSIS — N189 Chronic kidney disease, unspecified: Secondary | ICD-10-CM | POA: Diagnosis not present

## 2021-11-09 DIAGNOSIS — Z87891 Personal history of nicotine dependence: Secondary | ICD-10-CM | POA: Insufficient documentation

## 2021-11-09 DIAGNOSIS — Z5112 Encounter for antineoplastic immunotherapy: Secondary | ICD-10-CM | POA: Insufficient documentation

## 2021-11-09 DIAGNOSIS — E1140 Type 2 diabetes mellitus with diabetic neuropathy, unspecified: Secondary | ICD-10-CM | POA: Diagnosis not present

## 2021-11-09 DIAGNOSIS — Z853 Personal history of malignant neoplasm of breast: Secondary | ICD-10-CM | POA: Diagnosis not present

## 2021-11-09 DIAGNOSIS — C9000 Multiple myeloma not having achieved remission: Secondary | ICD-10-CM

## 2021-11-09 DIAGNOSIS — Z8673 Personal history of transient ischemic attack (TIA), and cerebral infarction without residual deficits: Secondary | ICD-10-CM | POA: Diagnosis not present

## 2021-11-09 DIAGNOSIS — I252 Old myocardial infarction: Secondary | ICD-10-CM | POA: Insufficient documentation

## 2021-11-09 DIAGNOSIS — Z7189 Other specified counseling: Secondary | ICD-10-CM

## 2021-11-09 DIAGNOSIS — E538 Deficiency of other specified B group vitamins: Secondary | ICD-10-CM | POA: Insufficient documentation

## 2021-11-09 DIAGNOSIS — E1122 Type 2 diabetes mellitus with diabetic chronic kidney disease: Secondary | ICD-10-CM | POA: Insufficient documentation

## 2021-11-09 DIAGNOSIS — Z95828 Presence of other vascular implants and grafts: Secondary | ICD-10-CM

## 2021-11-09 LAB — CBC WITH DIFFERENTIAL (CANCER CENTER ONLY)
Abs Immature Granulocytes: 0.02 10*3/uL (ref 0.00–0.07)
Basophils Absolute: 0 10*3/uL (ref 0.0–0.1)
Basophils Relative: 1 %
Eosinophils Absolute: 0.1 10*3/uL (ref 0.0–0.5)
Eosinophils Relative: 2 %
HCT: 29.4 % — ABNORMAL LOW (ref 36.0–46.0)
Hemoglobin: 9.9 g/dL — ABNORMAL LOW (ref 12.0–15.0)
Immature Granulocytes: 0 %
Lymphocytes Relative: 35 %
Lymphs Abs: 2 10*3/uL (ref 0.7–4.0)
MCH: 30.7 pg (ref 26.0–34.0)
MCHC: 33.7 g/dL (ref 30.0–36.0)
MCV: 91 fL (ref 80.0–100.0)
Monocytes Absolute: 0.4 10*3/uL (ref 0.1–1.0)
Monocytes Relative: 7 %
Neutro Abs: 3.2 10*3/uL (ref 1.7–7.7)
Neutrophils Relative %: 55 %
Platelet Count: 313 10*3/uL (ref 150–400)
RBC: 3.23 MIL/uL — ABNORMAL LOW (ref 3.87–5.11)
RDW: 13.6 % (ref 11.5–15.5)
WBC Count: 5.7 10*3/uL (ref 4.0–10.5)
nRBC: 0 % (ref 0.0–0.2)

## 2021-11-09 LAB — CMP (CANCER CENTER ONLY)
ALT: 11 U/L (ref 0–44)
AST: 15 U/L (ref 15–41)
Albumin: 4 g/dL (ref 3.5–5.0)
Alkaline Phosphatase: 72 U/L (ref 38–126)
Anion gap: 9 (ref 5–15)
BUN: 15 mg/dL (ref 8–23)
CO2: 25 mmol/L (ref 22–32)
Calcium: 8.8 mg/dL — ABNORMAL LOW (ref 8.9–10.3)
Chloride: 107 mmol/L (ref 98–111)
Creatinine: 1.36 mg/dL — ABNORMAL HIGH (ref 0.44–1.00)
GFR, Estimated: 40 mL/min — ABNORMAL LOW (ref >60)
Glucose, Bld: 161 mg/dL — ABNORMAL HIGH (ref 70–99)
Potassium: 3.9 mmol/L (ref 3.5–5.1)
Sodium: 141 mmol/L (ref 135–145)
Total Bilirubin: 0.2 mg/dL — ABNORMAL LOW (ref 0.3–1.2)
Total Protein: 6.7 g/dL (ref 6.5–8.1)

## 2021-11-09 MED ORDER — HEPARIN SOD (PORK) LOCK FLUSH 100 UNIT/ML IV SOLN
500.0000 [IU] | Freq: Once | INTRAVENOUS | Status: AC | PRN
  Administered 2021-11-09: 500 [IU]

## 2021-11-09 MED ORDER — SODIUM CHLORIDE 0.9 % IV SOLN
20.0000 mg | Freq: Once | INTRAVENOUS | Status: AC
  Administered 2021-11-09: 20 mg via INTRAVENOUS
  Filled 2021-11-09: qty 20

## 2021-11-09 MED ORDER — SODIUM CHLORIDE 0.9% FLUSH
10.0000 mL | INTRAVENOUS | Status: DC | PRN
  Administered 2021-11-09: 10 mL

## 2021-11-09 MED ORDER — ALTEPLASE 2 MG IJ SOLR
2.0000 mg | Freq: Once | INTRAMUSCULAR | Status: AC
  Administered 2021-11-09: 2 mg
  Filled 2021-11-09: qty 2

## 2021-11-09 MED ORDER — CYANOCOBALAMIN 1000 MCG/ML IJ SOLN
1000.0000 ug | Freq: Once | INTRAMUSCULAR | Status: AC
  Administered 2021-11-09: 1000 ug via SUBCUTANEOUS
  Filled 2021-11-09: qty 1

## 2021-11-09 MED ORDER — ACETAMINOPHEN 325 MG PO TABS
650.0000 mg | ORAL_TABLET | Freq: Once | ORAL | Status: DC
  Filled 2021-11-09: qty 2

## 2021-11-09 MED ORDER — HEPARIN SOD (PORK) LOCK FLUSH 100 UNIT/ML IV SOLN: 500.0000 [IU] | Freq: Once | INTRAVENOUS | Status: DC | PRN

## 2021-11-09 MED ORDER — SODIUM CHLORIDE 0.9% FLUSH
10.0000 mL | Freq: Once | INTRAVENOUS | Status: AC
  Administered 2021-11-09: 10 mL

## 2021-11-09 MED ORDER — DIPHENHYDRAMINE HCL 25 MG PO CAPS: 50.0000 mg | ORAL_CAPSULE | Freq: Once | ORAL | Status: DC

## 2021-11-09 MED ORDER — FAMOTIDINE IN NACL 20-0.9 MG/50ML-% IV SOLN
20.0000 mg | Freq: Once | INTRAVENOUS | Status: AC
  Administered 2021-11-09: 20 mg via INTRAVENOUS
  Filled 2021-11-09: qty 50

## 2021-11-09 MED ORDER — SODIUM CHLORIDE 0.9 % IV SOLN: Freq: Once | INTRAVENOUS | Status: AC

## 2021-11-09 MED ORDER — ACETAMINOPHEN 325 MG PO TABS
650.0000 mg | ORAL_TABLET | Freq: Once | ORAL | Status: AC
  Administered 2021-11-09: 650 mg via ORAL

## 2021-11-09 MED ORDER — SODIUM CHLORIDE 0.9 % IV SOLN
16.0000 mg/kg | Freq: Once | INTRAVENOUS | Status: AC
  Administered 2021-11-09: 1400 mg via INTRAVENOUS
  Filled 2021-11-09: qty 60

## 2021-11-09 MED ORDER — LORATADINE 10 MG PO TABS
10.0000 mg | ORAL_TABLET | Freq: Once | ORAL | Status: AC
  Administered 2021-11-09: 10 mg via ORAL
  Filled 2021-11-09: qty 1

## 2021-11-09 MED ORDER — SODIUM CHLORIDE 0.9 % IV SOLN
300.0000 mg | Freq: Once | INTRAVENOUS | Status: AC
  Administered 2021-11-09: 300 mg via INTRAVENOUS
  Filled 2021-11-09: qty 300

## 2021-11-09 MED ORDER — SODIUM CHLORIDE 0.9% FLUSH: 10.0000 mL | Freq: Once | INTRAVENOUS | Status: DC | PRN

## 2021-11-09 NOTE — Patient Instructions (Signed)
[facility]  Discharge Instructions: ?Thank you for choosing [facility] to provide your oncology and hematology care.  ? ?If you have a lab appointment with the [facility], please go directly to the [facility] and check in at the registration area. ?  ?Wear comfortable clothing and clothing appropriate for easy access to any Portacath or PICC line.  ? ?We strive to give you quality time with your provider. You may need to reschedule your appointment if you arrive late (15 or more minutes).  Arriving late affects you and other patients whose appointments are after yours.  Also, if you miss three or more appointments without notifying the office, you may be dismissed from the clinic at the provider?s discretion.    ?  ?For prescription refill requests, have your pharmacy contact our office and allow 72 hours for refills to be completed.   ? ?Today you received the following chemotherapy and/or immunotherapy agents: Darzalex  ?  ?To help prevent nausea and vomiting after your treatment, we encourage you to take your nausea medication as directed. ? ?BELOW ARE SYMPTOMS THAT SHOULD BE REPORTED IMMEDIATELY: ?*FEVER GREATER THAN 100.4 F (38 ?C) OR HIGHER ?*CHILLS OR SWEATING ?*NAUSEA AND VOMITING THAT IS NOT CONTROLLED WITH YOUR NAUSEA MEDICATION ?*UNUSUAL SHORTNESS OF BREATH ?*UNUSUAL BRUISING OR BLEEDING ?*URINARY PROBLEMS (pain or burning when urinating, or frequent urination) ?*BOWEL PROBLEMS (unusual diarrhea, constipation, pain near the anus) ?TENDERNESS IN MOUTH AND THROAT WITH OR WITHOUT PRESENCE OF ULCERS (sore throat, sores in mouth, or a toothache) ?UNUSUAL RASH, SWELLING OR PAIN  ?UNUSUAL VAGINAL DISCHARGE OR ITCHING  ? ?Items with * indicate a potential emergency and should be followed up as soon as possible or go to the Emergency Department if any problems should occur. ? ?Please show the CHEMOTHERAPY ALERT CARD or IMMUNOTHERAPY ALERT CARD at check-in to the  Emergency Department and triage nurse. ? ?Should you have questions after your visit or need to cancel or reschedule your appointment, please contact [facility]  Dept: [phone]  and follow the prompts.  Office hours are 8:00 a.m. to 4:30 p.m. Monday - Friday. Please note that voicemails left after 4:00 p.m. may not be returned until the following business day.  We are closed weekends and major holidays. You have access to a nurse at all times for urgent questions. Please call the main number to the clinic Dept: [phone] and follow the prompts. ? ? ?For any non-urgent questions, you may also contact your provider using MyChart. We now offer e-Visits for anyone 82 and older to request care online for non-urgent symptoms. For details visit [email or web address]. ?  ?Also download the MyChart app! Go to the app store, search "MyChart", open the app, select [facility], and log in with your MyChart username and password. ? ?Due to Covid, a mask is required upon entering the hospital/clinic. If you do not have a mask, one will be given to you upon arrival. For doctor visits, patients may have 1 support person aged 1 or older with them. For treatment visits, patients cannot have anyone with them due to current Covid guidelines and our immunocompromised population.  ? ?Vitamin B12 Injection ?What is this medication? ?Vitamin B12 (VAHY tuh min B12) prevents and treats low vitamin B12 levels in your body. It is used in people who do not get enough vitamin B12 from their diet or when their digestive tract does not absorb enough. Vitamin B12 plays an  important role in maintaining the health of your nervous system and red blood cells. ?This medicine may be used for other purposes; ask your health care provider or pharmacist if you have questions. ?COMMON BRAND NAME(S): B-12 Compliance Kit, B-12 Injection Kit, Cyomin, Dodex, LA-12, Nutri-Twelve, Physicians EZ Use B-12, Primabalt ?What  should I tell my care team before I take this medication? ?They need to know if you have any of these conditions: ?Kidney disease ?Leber's disease ?Megaloblastic anemia ?An unusual or allergic reaction to cyanocobalamin, cobalt, other medications, foods, dyes, or preservatives ?Pregnant or trying to get pregnant ?Breast-feeding ?How should I use this medication? ?This medication is injected into a muscle or deeply under the skin. It is usually given in a clinic or care team's office. However, your care team may teach you how to inject yourself. Follow all instructions. ?Talk to your care team about the use of this medication in children. Special care may be needed. ?Overdosage: If you think you have taken too much of this medicine contact a poison control center or emergency room at once. ?NOTE: This medicine is only for you. Do not share this medicine with others. ?What if I miss a dose? ?If you are given your dose at a clinic or care team's office, call to reschedule your appointment. If you give your own injections, and you miss a dose, take it as soon as you can. If it is almost time for your next dose, take only that dose. Do not take double or extra doses. ?What may interact with this medication? ?Colchicine ?Heavy alcohol intake ?This list may not describe all possible interactions. Give your health care provider a list of all the medicines, herbs, non-prescription drugs, or dietary supplements you use. Also tell them if you smoke, drink alcohol, or use illegal drugs. Some items may interact with your medicine. ?What should I watch for while using this medication? ?Visit your care team regularly. You may need blood work done while you are taking this medication. ?You may need to follow a special diet. Talk to your care team. Limit your alcohol intake and avoid smoking to get the best benefit. ?What side effects may I notice from receiving this medication? ?Side effects that you should report to your care team  as soon as possible: ?Allergic reactions--skin rash, itching, hives, swelling of the face, lips, tongue, or throat ?Swelling of the ankles, hands, or feet ?Trouble breathing ?Side effects that usually do not require medical attention (report to your care team if they continue or are bothersome): ?Diarrhea ?This list may not describe all possible side effects. Call your doctor for medical advice about side effects. You may report side effects to FDA at 1-800-FDA-1088. ?Where should I keep my medication? ?Keep out of the reach of children. ?Store at room temperature between 15 and 30 degrees C (59 and 85 degrees F). Protect from light. Throw away any unused medication after the expiration date. ?NOTE: This sheet is a summary. It may not cover all possible information. If you have questions about this medicine, talk to your doctor, pharmacist, or health care provider. ?? 2022 Elsevier/Gold Standard (2020-09-27 00:00:00) ? ?

## 2021-11-09 NOTE — Progress Notes (Signed)
Ok per patient insurance to receive Venofer. ? ?Tracy [name], , BCPS, BCOP ?11/09/2021 ?12:18 PM ? ?

## 2021-11-12 ENCOUNTER — Other Ambulatory Visit: Payer: Self-pay | Admitting: Hematology

## 2021-11-12 LAB — MULTIPLE MYELOMA PANEL, SERUM
Albumin SerPl Elph-Mcnc: 3.4 g/dL (ref 2.9–4.4)
Albumin/Glob SerPl: 1.3 (ref 0.7–1.7)
Alpha 1: 0.2 g/dL (ref 0.0–0.4)
Alpha2 Glob SerPl Elph-Mcnc: 1.1 g/dL — ABNORMAL HIGH (ref 0.4–1.0)
B-Globulin SerPl Elph-Mcnc: 1 g/dL (ref 0.7–1.3)
Gamma Glob SerPl Elph-Mcnc: 0.4 g/dL (ref 0.4–1.8)
Globulin, Total: 2.8 g/dL (ref 2.2–3.9)
IgA: 90 mg/dL (ref 64–422)
IgG (Immunoglobin G), Serum: 451 mg/dL — ABNORMAL LOW (ref 586–1602)
IgM (Immunoglobulin M), Srm: 43 mg/dL (ref 26–217)
Total Protein ELP: 6.2 g/dL (ref 6.0–8.5)

## 2021-11-15 ENCOUNTER — Other Ambulatory Visit: Payer: Self-pay

## 2021-11-15 ENCOUNTER — Inpatient Hospital Stay: Payer: Medicare Other

## 2021-11-15 VITALS — BP 159/70 | HR 81 | Temp 98.9°F | Resp 18

## 2021-11-15 DIAGNOSIS — N189 Chronic kidney disease, unspecified: Secondary | ICD-10-CM | POA: Diagnosis not present

## 2021-11-15 DIAGNOSIS — C9000 Multiple myeloma not having achieved remission: Secondary | ICD-10-CM | POA: Diagnosis not present

## 2021-11-15 DIAGNOSIS — Z87891 Personal history of nicotine dependence: Secondary | ICD-10-CM | POA: Diagnosis not present

## 2021-11-15 DIAGNOSIS — E538 Deficiency of other specified B group vitamins: Secondary | ICD-10-CM | POA: Diagnosis not present

## 2021-11-15 DIAGNOSIS — D509 Iron deficiency anemia, unspecified: Secondary | ICD-10-CM | POA: Diagnosis not present

## 2021-11-15 DIAGNOSIS — I252 Old myocardial infarction: Secondary | ICD-10-CM | POA: Diagnosis not present

## 2021-11-15 DIAGNOSIS — Z5112 Encounter for antineoplastic immunotherapy: Secondary | ICD-10-CM | POA: Diagnosis not present

## 2021-11-15 DIAGNOSIS — Z79899 Other long term (current) drug therapy: Secondary | ICD-10-CM | POA: Diagnosis not present

## 2021-11-15 DIAGNOSIS — E1122 Type 2 diabetes mellitus with diabetic chronic kidney disease: Secondary | ICD-10-CM | POA: Diagnosis not present

## 2021-11-15 DIAGNOSIS — E1140 Type 2 diabetes mellitus with diabetic neuropathy, unspecified: Secondary | ICD-10-CM | POA: Diagnosis not present

## 2021-11-15 DIAGNOSIS — Z853 Personal history of malignant neoplasm of breast: Secondary | ICD-10-CM | POA: Diagnosis not present

## 2021-11-15 DIAGNOSIS — Z923 Personal history of irradiation: Secondary | ICD-10-CM | POA: Diagnosis not present

## 2021-11-15 DIAGNOSIS — C7951 Secondary malignant neoplasm of bone: Secondary | ICD-10-CM

## 2021-11-15 DIAGNOSIS — Z8673 Personal history of transient ischemic attack (TIA), and cerebral infarction without residual deficits: Secondary | ICD-10-CM | POA: Diagnosis not present

## 2021-11-15 LAB — CBC WITH DIFFERENTIAL (CANCER CENTER ONLY)
Abs Immature Granulocytes: 0.02 10*3/uL (ref 0.00–0.07)
Basophils Absolute: 0 10*3/uL (ref 0.0–0.1)
Basophils Relative: 1 %
Eosinophils Absolute: 0.1 10*3/uL (ref 0.0–0.5)
Eosinophils Relative: 2 %
HCT: 28.7 % — ABNORMAL LOW (ref 36.0–46.0)
Hemoglobin: 9.6 g/dL — ABNORMAL LOW (ref 12.0–15.0)
Immature Granulocytes: 0 %
Lymphocytes Relative: 35 %
Lymphs Abs: 1.9 10*3/uL (ref 0.7–4.0)
MCH: 30.1 pg (ref 26.0–34.0)
MCHC: 33.4 g/dL (ref 30.0–36.0)
MCV: 90 fL (ref 80.0–100.0)
Monocytes Absolute: 0.4 10*3/uL (ref 0.1–1.0)
Monocytes Relative: 8 %
Neutro Abs: 3 10*3/uL (ref 1.7–7.7)
Neutrophils Relative %: 54 %
Platelet Count: 314 10*3/uL (ref 150–400)
RBC: 3.19 MIL/uL — ABNORMAL LOW (ref 3.87–5.11)
RDW: 14.2 % (ref 11.5–15.5)
WBC Count: 5.5 10*3/uL (ref 4.0–10.5)
nRBC: 0 % (ref 0.0–0.2)

## 2021-11-15 LAB — CMP (CANCER CENTER ONLY)
ALT: 10 U/L (ref 0–44)
AST: 12 U/L — ABNORMAL LOW (ref 15–41)
Albumin: 3.9 g/dL (ref 3.5–5.0)
Alkaline Phosphatase: 72 U/L (ref 38–126)
Anion gap: 8 (ref 5–15)
BUN: 12 mg/dL (ref 8–23)
CO2: 27 mmol/L (ref 22–32)
Calcium: 8.9 mg/dL (ref 8.9–10.3)
Chloride: 106 mmol/L (ref 98–111)
Creatinine: 1.25 mg/dL — ABNORMAL HIGH (ref 0.44–1.00)
GFR, Estimated: 44 mL/min — ABNORMAL LOW (ref >60)
Glucose, Bld: 110 mg/dL — ABNORMAL HIGH (ref 70–99)
Potassium: 3.6 mmol/L (ref 3.5–5.1)
Sodium: 141 mmol/L (ref 135–145)
Total Bilirubin: 0.3 mg/dL (ref 0.3–1.2)
Total Protein: 6.3 g/dL — ABNORMAL LOW (ref 6.5–8.1)

## 2021-11-15 MED ORDER — SODIUM CHLORIDE 0.9 % IV SOLN: Freq: Once | INTRAVENOUS | Status: DC

## 2021-11-15 MED ORDER — SODIUM CHLORIDE 0.9 % IV SOLN
60.0000 mg | Freq: Once | INTRAVENOUS | Status: AC
  Administered 2021-11-15: 60 mg via INTRAVENOUS
  Filled 2021-11-15: qty 20

## 2021-11-15 MED ORDER — SODIUM CHLORIDE 0.9% FLUSH
10.0000 mL | Freq: Once | INTRAVENOUS | Status: AC | PRN
  Administered 2021-11-15: 10 mL

## 2021-11-15 MED ORDER — HEPARIN SOD (PORK) LOCK FLUSH 100 UNIT/ML IV SOLN
500.0000 [IU] | Freq: Once | INTRAVENOUS | Status: AC | PRN
  Administered 2021-11-15: 500 [IU]

## 2021-12-06 IMAGING — CT NM PET IMAGE RESTAGE (PS) WHOLE BODY
7 series · 25 of 25 positions shown · non-contrast
Comparison: 01/20/2020

CLINICAL DATA: Subsequent treatment strategy for myeloma.

EXAM:
NUCLEAR MEDICINE PET WHOLE BODY
TECHNIQUE: 9.1 mCi F-18 FDG was injected intravenously. Full-ring PET imaging
was performed from the head to foot after the radiotracer. CT data
was obtained and used for attenuation correction and anatomic
localization.
Fasting blood glucose: 104 mg/dl

[Series 3: pet wb ac · axial · 5.0mm · 4.07mm/px · z∈[-214,+1430]mm · 5 of 412 slices shown]
[im 1/412]
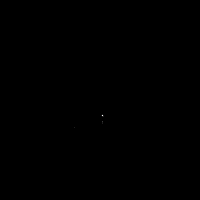
[im 103/412]
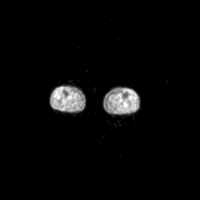
[im 206/412]
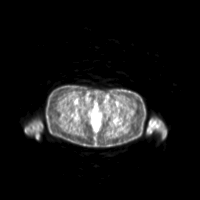
[im 309/412]
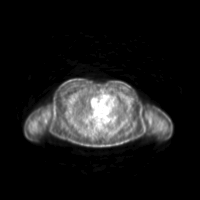
[im 412/412]
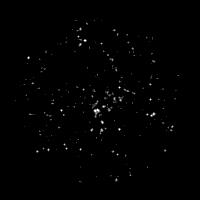

[Series 4: ct wb 5.0 hd_fov · axial · 5.0mm · 1.33mm/px · z∈[-214,+1430]mm · 5 of 412 slices shown]
[im 1/412]
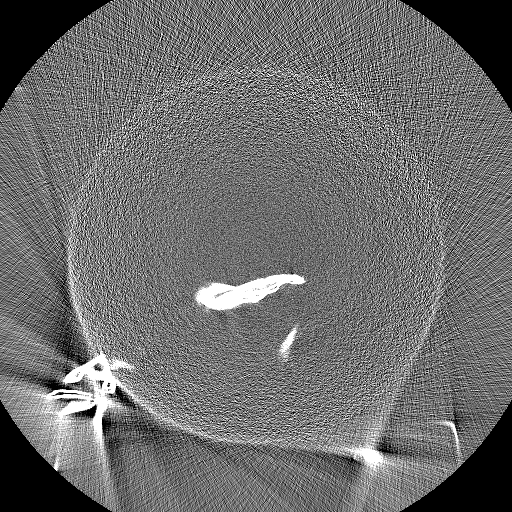
[im 103/412  soft-tissue]
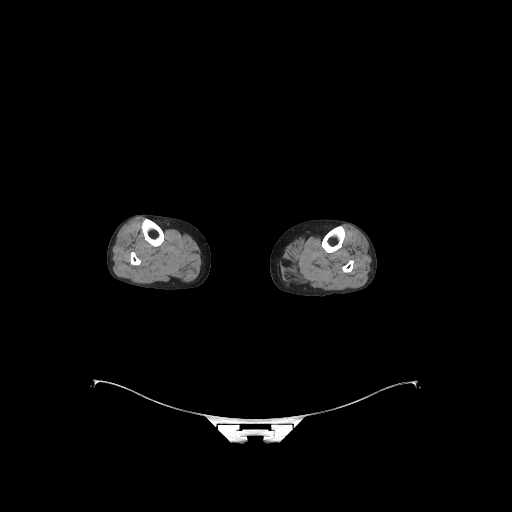
[im 206/412  soft-tissue]
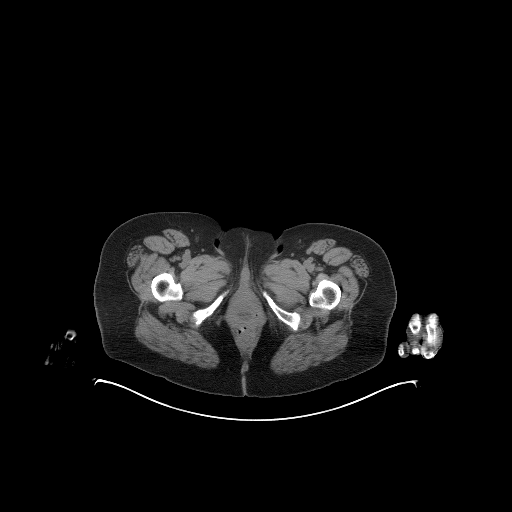
[im 309/412  soft-tissue]
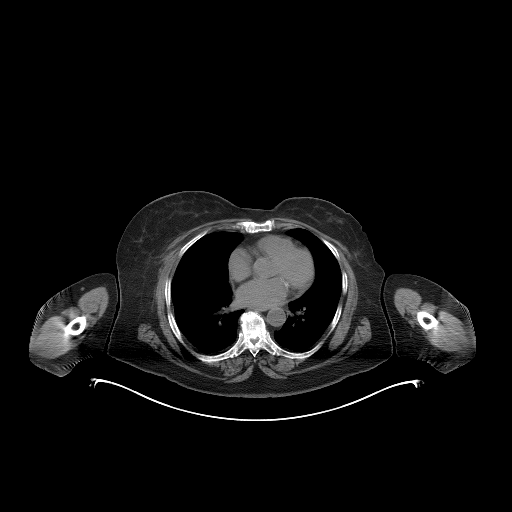
[im 412/412  soft-tissue]
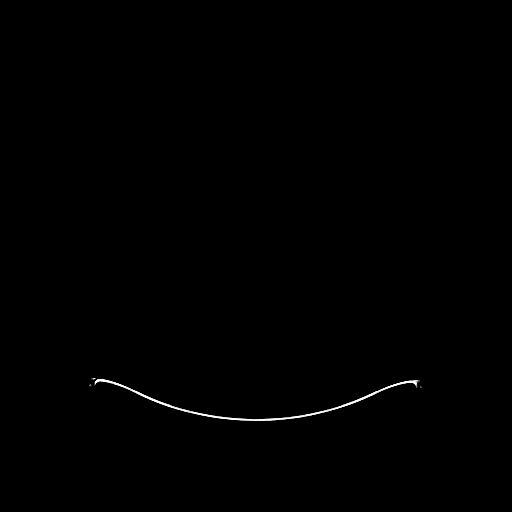

[Series 5: pet wb nac · axial · 5.0mm · 4.07mm/px · z∈[-210,+1430]mm · 6 of 411 slices shown]
[im 1/411]
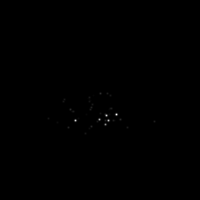
[im 83/411]
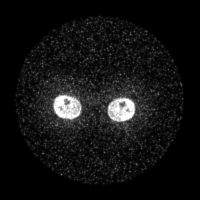
[im 165/411]
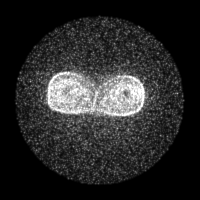
[im 247/411]
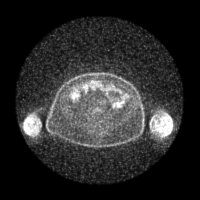
[im 329/411]
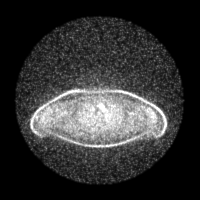
[im 411/411]
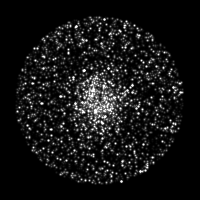

[Series 8: ct wb 5.0 br59 (id)_bone · axial · 5.0mm · 0.67mm/px · 1 of 65 slices shown]
[im 1/65  soft-tissue]
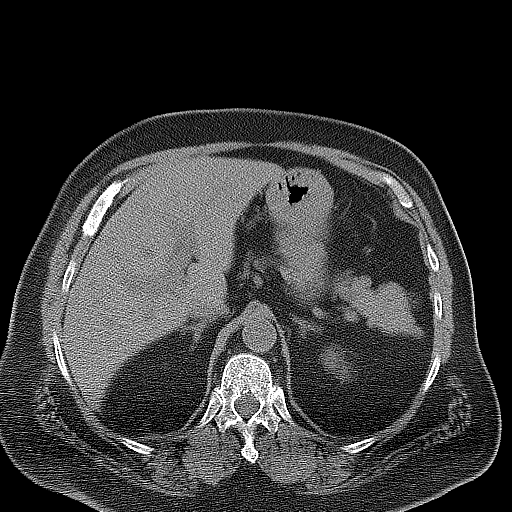

[Series 604: <mip collection> · coronal · 3.40mm/px · 1 of 32 slices shown]
[im 1/32]
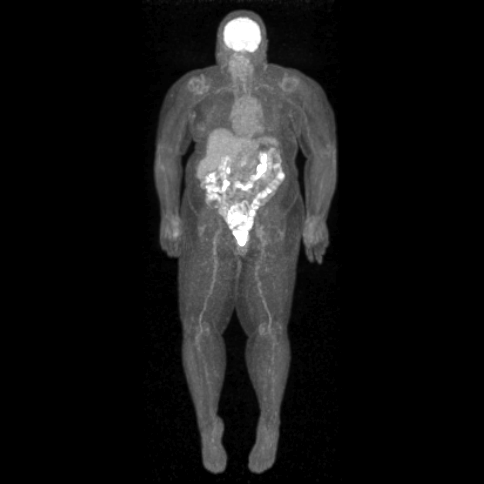

[Series 605: range-ct wb 5.0 hd_fov-tra-<alpha range> · 6 of 392 slices shown]
[im 1/392]
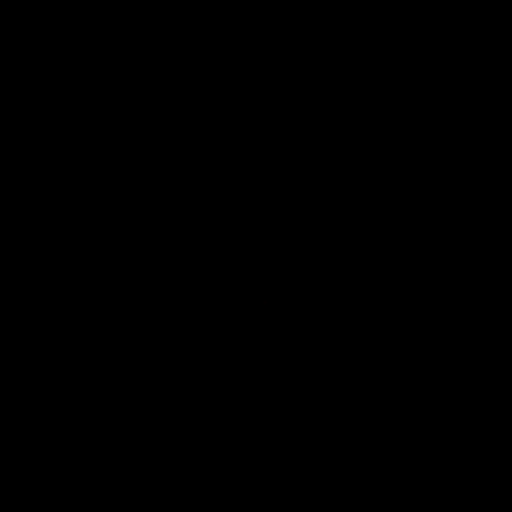
[im 79/392]
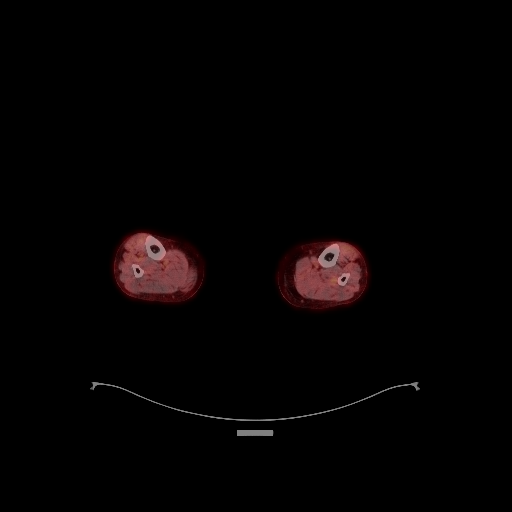
[im 157/392]
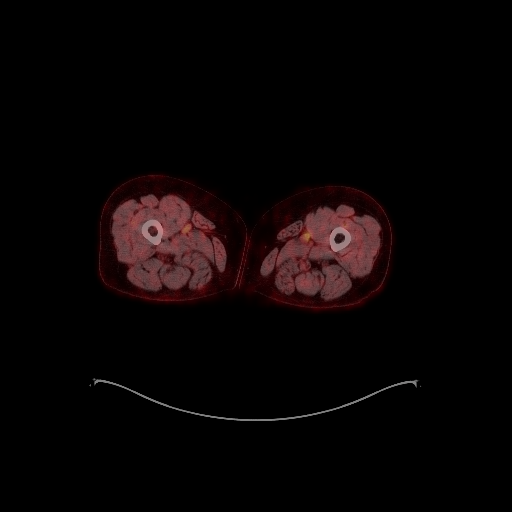
[im 235/392]
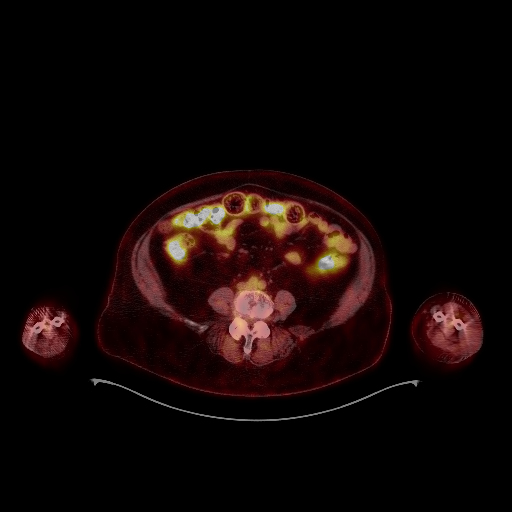
[im 313/392]
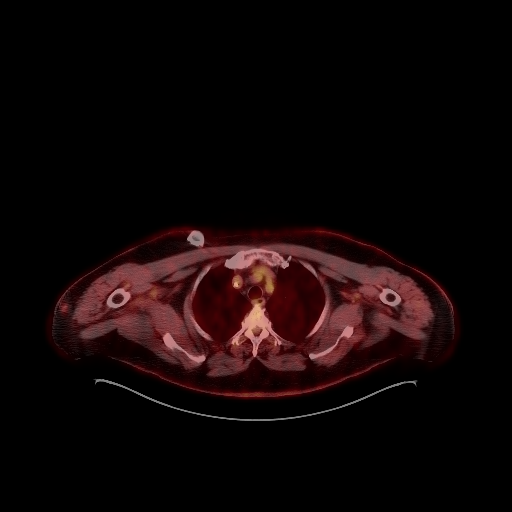
[im 392/392]
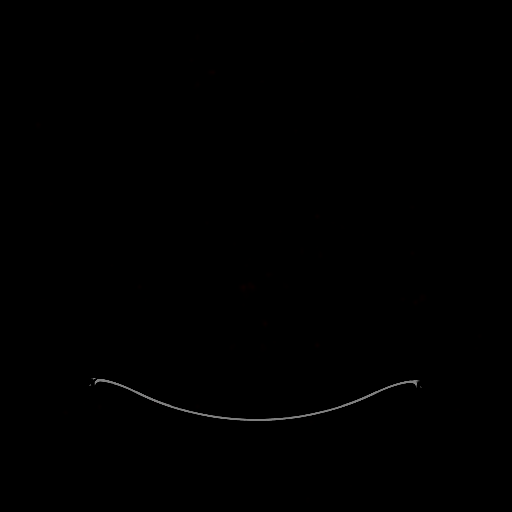

[Series 606: fused cor · 1 of 47 slices shown]
[im 1/47]
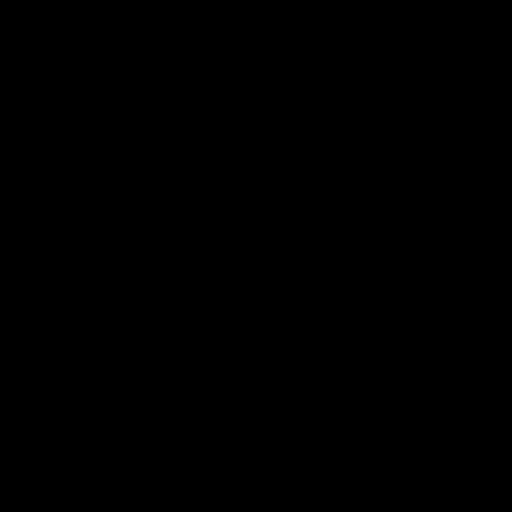

[25 of 25 positions shown; findings below may reference images not displayed]

FINDINGS: Mediastinal blood pool activity: SUV max

HEAD/NECK: No hypermetabolic activity in the scalp. No
hypermetabolic cervical lymph nodes.

Incidental CT findings: none

CHEST: No hypermetabolic mediastinal or hilar nodes. No suspicious
pulmonary nodules on the CT scan.

Incidental CT findings: Right Port-A-Cath tip is positioned in the
distal SVC. Atherosclerotic calcification is noted in the wall of
the thoracic aorta.

ABDOMEN/PELVIS: No abnormal hypermetabolic activity within the
liver, pancreas, adrenal glands, or spleen. No hypermetabolic lymph
nodes in the abdomen or pelvis.

Incidental CT findings: none

SKELETON: Left posterior acetabular lesion is stable in size at
cm (199/4). SUV max = 3.1 today compared to 5.5 previously.

Index right L5 lesion is similar in size at 2.9 cm today (165/4).
SUV max = 3.4, decreased from 4.6.

Index focus of hypermetabolism measured previously in the humeral
head at 4.1 is now 2.4. No underlying CT correlate.

Incidental CT findings: none

EXTREMITIES: No abnormal hypermetabolic activity in the lower
extremities.

Incidental CT findings: none
IMPRESSION: 1. Interval decrease in hypermetabolism associated with the left
acetabular and L5 lytic lesions identified on the previous study.
2. Tiny focus of FDG accumulation in the right humeral head
identified on the previous study has also decreased.
3. No new suspicious hypermetabolic abnormality identified on
today's study.
4.  Aortic Atherosclerois ([id number]-170.0)

## 2021-12-07 ENCOUNTER — Inpatient Hospital Stay: Payer: Medicare Other

## 2021-12-07 ENCOUNTER — Other Ambulatory Visit: Payer: Medicare Other

## 2021-12-07 ENCOUNTER — Inpatient Hospital Stay: Payer: Medicare Other | Attending: Hematology

## 2021-12-07 ENCOUNTER — Other Ambulatory Visit: Payer: Self-pay

## 2021-12-07 ENCOUNTER — Inpatient Hospital Stay (HOSPITAL_BASED_OUTPATIENT_CLINIC_OR_DEPARTMENT_OTHER): Payer: Medicare Other | Admitting: Hematology

## 2021-12-07 ENCOUNTER — Ambulatory Visit: Payer: Medicare Other

## 2021-12-07 VITALS — BP 170/90 | HR 96 | Temp 98.2°F | Resp 18

## 2021-12-07 VITALS — BP 169/71 | HR 87 | Temp 97.5°F | Resp 19 | Ht 59.0 in | Wt 183.3 lb

## 2021-12-07 DIAGNOSIS — Z5112 Encounter for antineoplastic immunotherapy: Secondary | ICD-10-CM | POA: Diagnosis not present

## 2021-12-07 DIAGNOSIS — Z79899 Other long term (current) drug therapy: Secondary | ICD-10-CM | POA: Diagnosis not present

## 2021-12-07 DIAGNOSIS — C9000 Multiple myeloma not having achieved remission: Secondary | ICD-10-CM | POA: Insufficient documentation

## 2021-12-07 DIAGNOSIS — E1122 Type 2 diabetes mellitus with diabetic chronic kidney disease: Secondary | ICD-10-CM | POA: Insufficient documentation

## 2021-12-07 DIAGNOSIS — Z7189 Other specified counseling: Secondary | ICD-10-CM

## 2021-12-07 DIAGNOSIS — I252 Old myocardial infarction: Secondary | ICD-10-CM | POA: Insufficient documentation

## 2021-12-07 DIAGNOSIS — D509 Iron deficiency anemia, unspecified: Secondary | ICD-10-CM | POA: Insufficient documentation

## 2021-12-07 DIAGNOSIS — C7951 Secondary malignant neoplasm of bone: Secondary | ICD-10-CM

## 2021-12-07 DIAGNOSIS — Z8673 Personal history of transient ischemic attack (TIA), and cerebral infarction without residual deficits: Secondary | ICD-10-CM | POA: Diagnosis not present

## 2021-12-07 DIAGNOSIS — N189 Chronic kidney disease, unspecified: Secondary | ICD-10-CM | POA: Insufficient documentation

## 2021-12-07 DIAGNOSIS — E1140 Type 2 diabetes mellitus with diabetic neuropathy, unspecified: Secondary | ICD-10-CM | POA: Insufficient documentation

## 2021-12-07 DIAGNOSIS — E538 Deficiency of other specified B group vitamins: Secondary | ICD-10-CM | POA: Insufficient documentation

## 2021-12-07 DIAGNOSIS — Z923 Personal history of irradiation: Secondary | ICD-10-CM | POA: Diagnosis not present

## 2021-12-07 DIAGNOSIS — Z87891 Personal history of nicotine dependence: Secondary | ICD-10-CM | POA: Insufficient documentation

## 2021-12-07 DIAGNOSIS — Z853 Personal history of malignant neoplasm of breast: Secondary | ICD-10-CM | POA: Insufficient documentation

## 2021-12-07 DIAGNOSIS — Z95828 Presence of other vascular implants and grafts: Secondary | ICD-10-CM

## 2021-12-07 LAB — CBC WITH DIFFERENTIAL (CANCER CENTER ONLY)
Abs Immature Granulocytes: 0.01 10*3/uL (ref 0.00–0.07)
Basophils Absolute: 0 10*3/uL (ref 0.0–0.1)
Basophils Relative: 1 %
Eosinophils Absolute: 0.1 10*3/uL (ref 0.0–0.5)
Eosinophils Relative: 2 %
HCT: 30.7 % — ABNORMAL LOW (ref 36.0–46.0)
Hemoglobin: 10.3 g/dL — ABNORMAL LOW (ref 12.0–15.0)
Immature Granulocytes: 0 %
Lymphocytes Relative: 41 %
Lymphs Abs: 1.7 10*3/uL (ref 0.7–4.0)
MCH: 30.6 pg (ref 26.0–34.0)
MCHC: 33.6 g/dL (ref 30.0–36.0)
MCV: 91.1 fL (ref 80.0–100.0)
Monocytes Absolute: 0.3 10*3/uL (ref 0.1–1.0)
Monocytes Relative: 6 %
Neutro Abs: 2.2 10*3/uL (ref 1.7–7.7)
Neutrophils Relative %: 50 %
Platelet Count: 289 10*3/uL (ref 150–400)
RBC: 3.37 MIL/uL — ABNORMAL LOW (ref 3.87–5.11)
RDW: 14.5 % (ref 11.5–15.5)
WBC Count: 4.3 10*3/uL (ref 4.0–10.5)
nRBC: 0 % (ref 0.0–0.2)

## 2021-12-07 LAB — CMP (CANCER CENTER ONLY)
ALT: 11 U/L (ref 0–44)
AST: 15 U/L (ref 15–41)
Albumin: 4.2 g/dL (ref 3.5–5.0)
Alkaline Phosphatase: 69 U/L (ref 38–126)
Anion gap: 9 (ref 5–15)
BUN: 12 mg/dL (ref 8–23)
CO2: 25 mmol/L (ref 22–32)
Calcium: 9.2 mg/dL (ref 8.9–10.3)
Chloride: 107 mmol/L (ref 98–111)
Creatinine: 1.32 mg/dL — ABNORMAL HIGH (ref 0.44–1.00)
GFR, Estimated: 42 mL/min — ABNORMAL LOW (ref >60)
Glucose, Bld: 107 mg/dL — ABNORMAL HIGH (ref 70–99)
Potassium: 4 mmol/L (ref 3.5–5.1)
Sodium: 141 mmol/L (ref 135–145)
Total Bilirubin: 0.3 mg/dL (ref 0.3–1.2)
Total Protein: 6.7 g/dL (ref 6.5–8.1)

## 2021-12-07 MED ORDER — SODIUM CHLORIDE 0.9 % IV SOLN: Freq: Once | INTRAVENOUS | Status: AC

## 2021-12-07 MED ORDER — SODIUM CHLORIDE 0.9 % IV SOLN
20.0000 mg | Freq: Once | INTRAVENOUS | Status: AC
  Administered 2021-12-07: 20 mg via INTRAVENOUS
  Filled 2021-12-07: qty 20

## 2021-12-07 MED ORDER — HEPARIN SOD (PORK) LOCK FLUSH 100 UNIT/ML IV SOLN
500.0000 [IU] | Freq: Once | INTRAVENOUS | Status: AC | PRN
  Administered 2021-12-07: 500 [IU]

## 2021-12-07 MED ORDER — LORATADINE 10 MG PO TABS
10.0000 mg | ORAL_TABLET | Freq: Once | ORAL | Status: DC
  Filled 2021-12-07: qty 1

## 2021-12-07 MED ORDER — SODIUM CHLORIDE 0.9 % IV SOLN
16.0000 mg/kg | Freq: Once | INTRAVENOUS | Status: AC
  Administered 2021-12-07: 1400 mg via INTRAVENOUS
  Filled 2021-12-07: qty 60

## 2021-12-07 MED ORDER — SODIUM CHLORIDE 0.9 % IV SOLN: Freq: Once | INTRAVENOUS | Status: DC

## 2021-12-07 MED ORDER — ACETAMINOPHEN 325 MG PO TABS
650.0000 mg | ORAL_TABLET | Freq: Once | ORAL | Status: AC
  Administered 2021-12-07: 650 mg via ORAL
  Filled 2021-12-07: qty 2

## 2021-12-07 MED ORDER — FAMOTIDINE IN NACL 20-0.9 MG/50ML-% IV SOLN
20.0000 mg | Freq: Once | INTRAVENOUS | Status: AC
  Administered 2021-12-07: 20 mg via INTRAVENOUS
  Filled 2021-12-07: qty 50

## 2021-12-07 MED ORDER — ACETAMINOPHEN 325 MG PO TABS: 650.0000 mg | ORAL_TABLET | Freq: Once | ORAL | Status: DC

## 2021-12-07 MED ORDER — DIPHENHYDRAMINE HCL 25 MG PO CAPS
50.0000 mg | ORAL_CAPSULE | Freq: Once | ORAL | Status: AC
  Administered 2021-12-07: 50 mg via ORAL
  Filled 2021-12-07: qty 2

## 2021-12-07 MED ORDER — SODIUM CHLORIDE 0.9% FLUSH
10.0000 mL | Freq: Once | INTRAVENOUS | Status: AC
  Administered 2021-12-07: 10 mL

## 2021-12-07 MED ORDER — CYANOCOBALAMIN 1000 MCG/ML IJ SOLN
1000.0000 ug | Freq: Once | INTRAMUSCULAR | Status: AC
  Administered 2021-12-07: 1000 ug via INTRAMUSCULAR
  Filled 2021-12-07: qty 1

## 2021-12-07 MED ORDER — SODIUM CHLORIDE 0.9% FLUSH
10.0000 mL | INTRAVENOUS | Status: DC | PRN
  Administered 2021-12-07: 10 mL

## 2021-12-07 MED ORDER — SODIUM CHLORIDE 0.9 % IV SOLN
300.0000 mg | Freq: Once | INTRAVENOUS | Status: AC
  Administered 2021-12-07: 300 mg via INTRAVENOUS
  Filled 2021-12-07: qty 300

## 2021-12-07 NOTE — Patient Instructions (Signed)
[facility]  Discharge Instructions: ?Thank you for choosing [facility] to provide your oncology and hematology care.  ? ?If you have a lab appointment with the [facility], please go directly to the [facility] and check in at the registration area. ?  ?Wear comfortable clothing and clothing appropriate for easy access to any Portacath or PICC line.  ? ?We strive to give you quality time with your provider. You may need to reschedule your appointment if you arrive late (15 or more minutes).  Arriving late affects you and other patients whose appointments are after yours.  Also, if you miss three or more appointments without notifying the office, you may be dismissed from the clinic at the provider?s discretion.    ?  ?For prescription refill requests, have your pharmacy contact our office and allow 72 hours for refills to be completed.   ? ?Today you received the following chemotherapy and/or immunotherapy agents: Darzalex, Venofer, B-12 ?  ?To help prevent nausea and vomiting after your treatment, we encourage you to take your nausea medication as directed. ? ?BELOW ARE SYMPTOMS THAT SHOULD BE REPORTED IMMEDIATELY: ?*FEVER GREATER THAN 100.4 F (38 ?C) OR HIGHER ?*CHILLS OR SWEATING ?*NAUSEA AND VOMITING THAT IS NOT CONTROLLED WITH YOUR NAUSEA MEDICATION ?*UNUSUAL SHORTNESS OF BREATH ?*UNUSUAL BRUISING OR BLEEDING ?*URINARY PROBLEMS (pain or burning when urinating, or frequent urination) ?*BOWEL PROBLEMS (unusual diarrhea, constipation, pain near the anus) ?TENDERNESS IN MOUTH AND THROAT WITH OR WITHOUT PRESENCE OF ULCERS (sore throat, sores in mouth, or a toothache) ?UNUSUAL RASH, SWELLING OR PAIN  ?UNUSUAL VAGINAL DISCHARGE OR ITCHING  ? ?Items with * indicate a potential emergency and should be followed up as soon as possible or go to the Emergency Department if any problems should occur. ? ?Please show the CHEMOTHERAPY ALERT CARD or IMMUNOTHERAPY ALERT CARD at  check-in to the Emergency Department and triage nurse. ? ?Should you have questions after your visit or need to cancel or reschedule your appointment, please contact [facility]  Dept: [phone]  and follow the prompts.  Office hours are 8:00 a.m. to 4:30 p.m. Monday - Friday. Please note that voicemails left after 4:00 p.m. may not be returned until the following business day.  We are closed weekends and major holidays. You have access to a nurse at all times for urgent questions. Please call the main number to the clinic Dept: [phone] and follow the prompts. ? ? ?For any non-urgent questions, you may also contact your provider using MyChart. We now offer e-Visits for anyone 38 and older to request care online for non-urgent symptoms. For details visit [email or web address]. ?  ?Also download the MyChart app! Go to the app store, search "MyChart", open the app, select [facility], and log in with your MyChart username and password. ? ?Due to Covid, a mask is required upon entering the hospital/clinic. If you do not have a mask, one will be given to you upon arrival. For doctor visits, patients may have 1 support person aged 40 or older with them. For treatment visits, patients cannot have anyone with them due to current Covid guidelines and our immunocompromised population.  ? ?Vitamin B12 Injection ?What is this medication? ?Vitamin B12 (VAHY tuh min B12) prevents and treats low vitamin B12 levels in your body. It is used in people who do not get enough vitamin B12 from their diet or when their digestive tract does not absorb enough. Vitamin B12 plays  an important role in maintaining the health of your nervous system and red blood cells. ?This medicine may be used for other purposes; ask your health care provider or pharmacist if you have questions. ?COMMON BRAND NAME(S): B-12 Compliance Kit, B-12 Injection Kit, Cyomin, Dodex, LA-12, Nutri-Twelve, Physicians EZ Use B-12,  Primabalt ?What should I tell my care team before I take this medication? ?They need to know if you have any of these conditions: ?Kidney disease ?Leber's disease ?Megaloblastic anemia ?An unusual or allergic reaction to cyanocobalamin, cobalt, other medications, foods, dyes, or preservatives ?Pregnant or trying to get pregnant ?Breast-feeding ?How should I use this medication? ?This medication is injected into a muscle or deeply under the skin. It is usually given in a clinic or care team's office. However, your care team may teach you how to inject yourself. Follow all instructions. ?Talk to your care team about the use of this medication in children. Special care may be needed. ?Overdosage: If you think you have taken too much of this medicine contact a poison control center or emergency room at once. ?NOTE: This medicine is only for you. Do not share this medicine with others. ?What if I miss a dose? ?If you are given your dose at a clinic or care team's office, call to reschedule your appointment. If you give your own injections, and you miss a dose, take it as soon as you can. If it is almost time for your next dose, take only that dose. Do not take double or extra doses. ?What may interact with this medication? ?Colchicine ?Heavy alcohol intake ?This list may not describe all possible interactions. Give your health care provider a list of all the medicines, herbs, non-prescription drugs, or dietary supplements you use. Also tell them if you smoke, drink alcohol, or use illegal drugs. Some items may interact with your medicine. ?What should I watch for while using this medication? ?Visit your care team regularly. You may need blood work done while you are taking this medication. ?You may need to follow a special diet. Talk to your care team. Limit your alcohol intake and avoid smoking to get the best benefit. ?What side effects may I notice from receiving this medication? ?Side effects that you should report  to your care team as soon as possible: ?Allergic reactions--skin rash, itching, hives, swelling of the face, lips, tongue, or throat ?Swelling of the ankles, hands, or feet ?Trouble breathing ?Side effects that usually do not require medical attention (report to your care team if they continue or are bothersome): ?Diarrhea ?This list may not describe all possible side effects. Call your doctor for medical advice about side effects. You may report side effects to FDA at 1-800-FDA-1088. ?Where should I keep my medication? ?Keep out of the reach of children. ?Store at room temperature between 15 and 30 degrees C (59 and 85 degrees F). Protect from light. Throw away any unused medication after the expiration date. ?NOTE: This sheet is a summary. It may not cover all possible information. If you have questions about this medicine, talk to your doctor, pharmacist, or health care provider. ?? 2022 Elsevier/Gold Standard (2020-09-27 00:00:00) ? ?

## 2021-12-07 NOTE — Progress Notes (Signed)
Patient has had iron previously- chose not to stay for the 30 minute observation. VSS- IV removed with tip intact. ?BP (!) 170/90 (BP Location: Right Arm, Patient Position: Sitting) Comment: pt moving around a lot to leave  Pulse 96   Temp 98.2 ?F (36.8 ?C) (Oral)   Resp 18   SpO2 100%  ? ?

## 2021-12-10 ENCOUNTER — Telehealth: Payer: Self-pay | Admitting: Hematology

## 2021-12-10 NOTE — Telephone Encounter (Signed)
Scheduled follow-up appointments per 5/12 los. Patient is aware. Mailed calendar. ?

## 2021-12-12 LAB — MULTIPLE MYELOMA PANEL, SERUM
Albumin SerPl Elph-Mcnc: 3.5 g/dL (ref 2.9–4.4)
Albumin/Glob SerPl: 1.3 (ref 0.7–1.7)
Alpha 1: 0.2 g/dL (ref 0.0–0.4)
Alpha2 Glob SerPl Elph-Mcnc: 1.2 g/dL — ABNORMAL HIGH (ref 0.4–1.0)
B-Globulin SerPl Elph-Mcnc: 0.9 g/dL (ref 0.7–1.3)
Gamma Glob SerPl Elph-Mcnc: 0.5 g/dL (ref 0.4–1.8)
Globulin, Total: 2.8 g/dL (ref 2.2–3.9)
IgA: 86 mg/dL (ref 64–422)
IgG (Immunoglobin G), Serum: 452 mg/dL — ABNORMAL LOW (ref 586–1602)
IgM (Immunoglobulin M), Srm: 51 mg/dL (ref 26–217)
Total Protein ELP: 6.3 g/dL (ref 6.0–8.5)

## 2021-12-14 ENCOUNTER — Encounter: Payer: Self-pay | Admitting: Hematology

## 2021-12-14 NOTE — Progress Notes (Signed)
Tracy [name] Follow up:   DOS .12/07/2021  [name], MD [address] [place] [address]   CC: Follow-up for continued evaluation and management of multiple myeloma  SUMMARY OF ONCOLOGIC HISTORY: Oncology History  History of right breast [name]  04/16/2000 Surgery   Left breast: Triple negative  invasive ductal [name] treated with lumpectomy, adjuvant chemotherapy, radiation , in [place], unknown stage    06/07/2015 Mammogram   Right breast mass 6x 6 x 5 mm, right axillary lymph node with slight cortex thickening measured 5 mm     06/13/2015 Initial Diagnosis   Right breast needle biopsy: Invasive ductal carcinoma, grade 1, right axillary lymph node biopsy negative , ER 95%, PR 5%, Ki-67 10%, HER-2 negative    06/13/2015 Clinical Stage   Stage IA: T1b N0    07/07/2015 Surgery   Right lumpectomy: Invasive ductal carcinoma grade 1, 1 cm span, with low-grade DCIS, DCIS focally 0.1 cm to inferior margin, 0/3 lymph nodes negative, ER 95%, PR 5%, HER-2 negative ratio 1.1, Ki-67 10%    07/07/2015 Pathologic Stage   Stage IA: T1c N0    07/13/2015 Procedure   Breast High/Moderate Risk Panel reveals no clinically significant variant at ATM, BRCA1, BRCA2, CDH1, CHEK2, PALB2, PTEN, and TP53.      08/23/2015 - 09/21/2015 Radiation Therapy   Adjuvant Radiation: Right breast/ 42.5Gy at 2.5 Gy per fraction x 17 fractions.   Right breast boost/ 7.5 Gy at 2.5 Gy per fraction x 3 fractions     Anti-estrogen oral therapy   Patient refused antiestrogen therapy    10/20/2015 Survivorship   Survivorship care plan completed and mailed to patient in lieu of in person visit at her request    Iron deficiency anemia  Multiple myeloma ([place])  01/25/2020 Initial Diagnosis   Multiple myeloma ([place])    02/11/2020 - 05/19/2020 Adjuvant Chemotherapy   Daratumumab Weekly    02/29/2020 - 03/13/2020 Radiation Therapy   The targets were treated to a total dose  of 20 Gy in 10 fractions of 2 Gy each to the left hip and L5 using one plan.   06/02/2020 - 10/05/2020 Adjuvant Chemotherapy   Daratumumab and Carfilzomib   10/19/2020 -  Adjuvant Chemotherapy   Maintenance Daratumumab--initially every 2 weeks, then every 4 weeks.    Malignant neoplasm metastatic to bone ([place])  02/08/2021 Initial Diagnosis   Bone metastases ([facility])      CURRENT THERAPY: Daratumumab/B12 injection Pamidronate (on hold for dental procedure)  INTERVAL HISTORY:  Tracy [name] here for continued evaluation and management of multiple myeloma.  She notes no acute new symptoms since her last clinic visit.  No abdominal pain no overt signs of GI bleeding. No significant toxicities from her current daratumumab treatment. No infection issues. No fevers no chills no night sweats no unexpected weight loss no focal bone pains. Labs done today were reviewed in detail with the patient.   Patient Active Problem List   Diagnosis Date Noted   Malignant neoplasm metastatic to bone ([place]) 02/08/2021   Angiodysplasia of intestine 08/23/2020   Port-A-Cath in place 08/04/2020   Counseling regarding advance care planning and goals of care 02/07/2020   Multiple myeloma ([place]) 01/25/2020   Dizziness 10/12/2019   Post-nasal drainage 10/12/2019   Allergic rhinitis 08/19/2018   Type 2 diabetes mellitus with diabetic neuropathy, unspecified ([place]) 11/05/2017   Encounter for long-term use of muscle relaxants 09/24/2017   CAD in native artery  09/24/2017   OSA (obstructive sleep apnea) 09/24/2017   [place] 09/24/2017   Asthenia 06/30/2017   Morbid obesity ([place]) 06/02/2017   Depression 06/02/2017   Iron deficiency anemia 09/23/2016   Anemia of chronic disease 09/28/2015   Genetic testing 08/21/2015   History of left breast [name] 07/13/2015   History of right breast [name] 06/20/2015   Hearing loss due to cerumen impaction 12/29/2014   Allergy to adhesive tape 05/19/2014   Hyperlipidemia  12/06/2013   GERD (gastroesophageal reflux disease) 12/06/2013   Cervical disc disease 11/11/2013   Osteopenia 05/25/2013   Allergic asthma 12/24/2012   Insomnia 04/02/2012   Physical exam, annual 04/02/2012   HTN (hypertension) 02/03/2012   Vertigo, benign positional 02/03/2012   Left groin pain 02/03/2012   Hip pain 10/10/2011   TIA (transient ischemic attack) 07/24/2011   Allergic reaction 07/05/2011   Osteoarthrosis involving lower leg 10/19/2010   Disorder of bone and cartilage 05/01/2010   Generalized anxiety disorder 05/01/2010   Vitamin D deficiency 02/20/2010   Unspecified chronic bronchitis ([place]) 08/24/2009   Other lymphedema 10/29/2007    is allergic to bacitracin-neomycin-polymyxin  [neomycin-bacitracin zn-polymyx], nsaids, tape, ambien [zolpidem tartrate], amoxicillin, clavulanic acid, contrast media [iodinated contrast media], latex, prednisone, and tessalon [benzonatate].  MEDICAL HISTORY: Past Medical History:  Diagnosis Date   Angiodysplasia of intestine 08/23/2020   Anxiety    Arthritis    Breast [name] ([place]) 06/13/15   [name] ([place]) 2000   breast [name]   Chronic bronchitis ([place])    Chronic bronchitis ([place])    Hyperlipidemia    Hypertension    Myocardial infarction [facility]) 2001   Personal history of radiation therapy    Restless leg    Stroke ([place]) 2004   TIA, no deficits    SURGICAL HISTORY: Past Surgical History:  Procedure Laterality Date   ABDOMINAL HYSTERECTOMY  1985   BREAST LUMPECTOMY Left 2000   radiation and chemo   BREAST LUMPECTOMY Right 2016   radiation   BREAST SURGERY  2001   lt breast lumpectomy   COLONOSCOPY WITH ESOPHAGOGASTRODUODENOSCOPY (EGD)  04/2020   ENTEROSCOPY N/A 03/15/2021   Procedure: ENTEROSCOPY;  Surgeon: Milus Banister, MD;  Location: [facility];  Service: Endoscopy;  Laterality: N/A;   GIVENS CAPSULE STUDY  07/2020   HOT HEMOSTASIS N/A 03/15/2021   Procedure: HOT HEMOSTASIS (ARGON PLASMA COAGULATION/BICAP);   Surgeon: Milus Banister, MD;  Location: [facility];  Service: Endoscopy;  Laterality: N/A;   IR IMAGING GUIDED PORT INSERTION  04/25/2020   RADIOACTIVE SEED GUIDED PARTIAL MASTECTOMY WITH AXILLARY SENTINEL LYMPH NODE BIOPSY Right 07/07/2015   Procedure: RIGHT RADIOACTIVE SEED GUIDED PARTIAL MASTECTOMY WITH AXILLARY SENTINEL LYMPH NODE BIOPSY;  Surgeon: Autumn Messing III, MD;  Location: [facility];  Service: General;  Laterality: Right;   SMALL INTESTINE SURGERY     TUBAL LIGATION     UPPER GASTROINTESTINAL ENDOSCOPY      SOCIAL HISTORY: Social History   Socioeconomic History   Marital status: Divorced    Spouse name: Not on file   Number of children: 7   Years of education: Not on file   Highest education level: Not on file  Occupational History   Occupation: retired  Tobacco Use   Smoking status: Former    Packs/day: 1.00    Years: 20.00    Pack years: 20.00    Types: Cigarettes    Quit date: 07/30/2011    Years since quitting: 10.3   Smokeless tobacco: Never  Tobacco comments:    Quit >4 years ago; 1 ppd for about 5/20 years (remaining was less)  Vaping Use   Vaping Use: Former  Substance and Sexual Activity   Alcohol use: No    Alcohol/week: 0.0 standard drinks   Drug use: No   Sexual activity: Not Currently  Other Topics Concern   Not on file  Social History Narrative   Lives alone.  Retired.  Education:  11th grade GED.  Children:  7 (one here).    Social Determinants of Health   Financial Resource Strain: Not on file  Food Insecurity: Not on file  Transportation Needs: Not on file  Physical Activity: Not on file  Stress: Not on file  Social Connections: Not on file  Intimate Partner Violence: Not on file    FAMILY HISTORY: Family History  Problem Relation Age of Onset   Emphysema Mother 61       smoker   Diabetes Father    Lung [name] Sister        dx. <50; former smoker   Diabetes Brother    Diabetes Brother    Brain [name] Brother  26       unknown tumor type   Diabetes Paternal Aunt    Stroke Maternal Grandmother    Diabetes Paternal Grandmother    [name] Daughter 31       neck [name]   Other Daughter        hysterectomy for unspecified reason   Colon [name] Daughter    Breast [name] Cousin    [name] Cousin        unspecified type   Breast [name] Other        triple negative breast [name] in her 69s   Colon polyps Neg Hx    Esophageal [name] Neg Hx    Gallbladder disease Neg Hx    ROS 10 Point review of Systems was done is negative except as noted above.  PHYSICAL EXAMINATION  ECOG PERFORMANCE STATUS: 1 - Symptomatic but completely ambulatory  Vitals:   12/07/21 1117  BP: (!) 169/71  Pulse: 87  Resp: 19  Temp: (!) 97.5 F (36.4 C)  SpO2: 100%  . NAD GENERAL:alert, in no acute distress and comfortable SKIN: no acute rashes, no significant lesions EYES: conjunctiva are pink and non-injected, sclera anicteric OROPHARYNX: MMM, no exudates, no oropharyngeal erythema or ulceration NECK: supple, no JVD LYMPH:  no palpable lymphadenopathy in the cervical, axillary or inguinal regions LUNGS: clear to auscultation b/l with normal respiratory effort HEART: regular rate & rhythm ABDOMEN:  normoactive bowel sounds , non tender, not distended. Extremity: no pedal edema PSYCH: alert & oriented x 3 with fluent speech NEURO: no focal motor/sensory deficits   LABORATORY DATA: .    Latest Ref Rng & Units 12/07/2021   10:47 AM 11/15/2021   10:46 AM 11/09/2021   11:56 AM  CBC  WBC 4.0 - 10.5 K/uL 4.3   5.5   5.7    Hemoglobin 12.0 - 15.0 g/dL 10.3   9.6   9.9    Hematocrit 36.0 - 46.0 % 30.7   28.7   29.4    Platelets 150 - 400 K/uL 289   314   313     .    Latest Ref Rng & Units 12/07/2021   10:47 AM 11/15/2021   10:46 AM 11/09/2021   11:56 AM  CMP  Glucose 70 - 99 mg/dL 107   110   161  BUN 8 - 23 mg/dL _0 Creatinine 0.44 - 1.00 mg/dL 1.32   1.25   1.36    Sodium 135 - 145 mmol/L  141   141   141    Potassium 3.5 - 5.1 mmol/L 4.0   3.6   3.9    Chloride 98 - 111 mmol/L 107   106   107    CO2 22 - 32 mmol/L _1 Calcium 8.9 - 10.3 mg/dL 9.2   8.9   8.8    Total Protein 6.5 - 8.1 g/dL 6.7   6.3   6.7    Total Bilirubin 0.3 - 1.2 mg/dL 0.3   0.3   0.2    Alkaline Phos 38 - 126 U/L 69   72   72    AST 15 - 41 U/L _2 ALT 0 - 44 U/L _3 ASSESSMENT and THERAPY PLAN:   76 yo here for follow-up of her for follow-up of her multiple myeloma   1)  IgG Kappa Multiple myeloma with bone lesions, anemia, renal insuff. M spike @ 3.7g/dl on diagnosis. 1p deletion, polymorphic variant, 13q deletion Multiple myeloma panel from 06/28/2021 with no M spike.  IFE positive for IgG kappa monoclonal protein possibly from her daratumumab. Currently on monthly daratumumab Pamidronate is on hold due to dental extraction in October 2022 2) h/o DM2 3) Diabetic Neuropathy 4) CKD - likely from DM2, but could have an element of myeloma kidney. 5) h/o TIA and AMI 6) Iron deficiency 7) B12 deficiency   PLAN: -Patient's labs today were discussed with her in detail CBC stable hemoglobin of 10.3 with normal WBC count and platelets CMP stable with stable creatinine of 1.32 Myeloma panel shows no notable M spike No new toxicities from the patient's daratumumab at this time We will continue daratumumab every 4 weeks with labs Continue Aredia every 8 weeks Continue B12 1000 mcg subcu every 4 weeks Continue IV iron as per plan  Follow-up note: please schedule next 4 doses of daratumumab every 4 weeks with port flush and labs. Continue B12 subcutaneous injection every 4 weeks Venofer 359m every 4 weeks x 2 doses Continue Aredia every 8 weeks MD visit in 8 weeks   The total time spent in the appointment was 31 minutes*.  All of the patient's questions were answered with apparent satisfaction. The patient knows to call the clinic with any problems,  questions or concerns.   GSullivan LoneMD MS AAHIVMS SSouth Lyon Medical CenterCWalnut Hill Medical CenterHematology/Oncology Physician [facility] .*Total Encounter Time as defined by the Centers for Medicare and Medicaid Services includes, in addition to the face-to-face time of a patient visit (documented in the note above) non-face-to-face time: obtaining and reviewing outside history, ordering and reviewing medications, tests or procedures, care coordination (communications with other health care professionals or caregivers) and documentation in the medical record.

## 2021-12-17 DIAGNOSIS — H2513 Age-related nuclear cataract, bilateral: Secondary | ICD-10-CM | POA: Diagnosis not present

## 2021-12-17 DIAGNOSIS — H40013 Open angle with borderline findings, low risk, bilateral: Secondary | ICD-10-CM | POA: Diagnosis not present

## 2021-12-17 DIAGNOSIS — H40053 Ocular hypertension, bilateral: Secondary | ICD-10-CM | POA: Diagnosis not present

## 2021-12-17 DIAGNOSIS — R7303 Prediabetes: Secondary | ICD-10-CM | POA: Diagnosis not present

## 2021-12-20 ENCOUNTER — Ambulatory Visit (INDEPENDENT_AMBULATORY_CARE_PROVIDER_SITE_OTHER): Payer: Medicare Other

## 2021-12-20 VITALS — Ht 59.0 in | Wt 185.6 lb

## 2021-12-20 DIAGNOSIS — Z599 Problem related to housing and economic circumstances, unspecified: Secondary | ICD-10-CM | POA: Diagnosis not present

## 2021-12-20 DIAGNOSIS — Z0000 Encounter for general adult medical examination without abnormal findings: Secondary | ICD-10-CM

## 2021-12-20 DIAGNOSIS — Z5941 Food insecurity: Secondary | ICD-10-CM | POA: Diagnosis not present

## 2021-12-20 NOTE — Patient Instructions (Signed)
[name] , Thank you for taking time to come for your Medicare Wellness Visit. I appreciate your ongoing commitment to your health goals. Please review the following plan we discussed and let me know if I can assist you in the future.   Screening recommendations/referrals: Colonoscopy: Done 05/13/2020. No follow up required. Mammogram: Done 09/21/2021 Repeat annually  Bone Density: Done 01/30/2021 Repeat every 2 years  Recommended yearly ophthalmology/optometry visit for glaucoma screening and checkup Recommended yearly dental visit for hygiene and checkup  Vaccinations: Influenza vaccine: Due Fall 2023. Pneumococcal vaccine: done 06/01/2015 and 05/10/2014 Tdap vaccine: Done 04/05/2013 Repeat in 10 years  Shingles vaccine: Done 05/01/2010.   Covid-19:Done 06/05/2020, 10/25/2019 and 09/27/2019.  Advanced directives: Please bring a copy of your health care power of attorney and living will to the office to be added to your chart at your convenience.   Conditions/risks identified: Aim for 30 minutes of exercise or brisk walking, 6-8 glasses of water, and 5 servings of fruits and vegetables each day.   Next appointment: Follow up in one year for your annual wellness visit 2024.   Preventive Care 14 Years and Older, Female Preventive care refers to lifestyle choices and visits with your health care provider that can promote health and wellness. What does preventive care include? A yearly physical exam. This is also called an annual well check. Dental exams once or twice a year. Routine eye exams. Ask your health care provider how often you should have your eyes checked. Personal lifestyle choices, including: Daily care of your teeth and gums. Regular physical activity. Eating a healthy diet. Avoiding tobacco and drug use. Limiting alcohol use. Practicing safe sex. Taking low-dose aspirin every day. Taking vitamin and mineral supplements as recommended by your health care provider. What  happens during an annual well check? The services and screenings done by your health care provider during your annual well check will depend on your age, overall health, lifestyle risk factors, and family history of disease. Counseling  Your health care provider may ask you questions about your: Alcohol use. Tobacco use. Drug use. Emotional well-being. Home and relationship well-being. Sexual activity. Eating habits. History of falls. Memory and ability to understand (cognition). Work and work Statistician. Reproductive health. Screening  You may have the following tests or measurements: Height, weight, and BMI. Blood pressure. Lipid and cholesterol levels. These may be checked every 5 years, or more frequently if you are over 13 years old. Skin check. Lung cancer screening. You may have this screening every year starting at age 70 if you have a 30-pack-year history of smoking and currently smoke or have quit within the past 15 years. Fecal occult blood test (FOBT) of the stool. You may have this test every year starting at age 60. Flexible sigmoidoscopy or colonoscopy. You may have a sigmoidoscopy every 5 years or a colonoscopy every 10 years starting at age 83. Hepatitis C blood test. Hepatitis B blood test. Sexually transmitted disease (STD) testing. Diabetes screening. This is done by checking your blood sugar (glucose) after you have not eaten for a while (fasting). You may have this done every 1-3 years. Bone density scan. This is done to screen for osteoporosis. You may have this done starting at age 105. Mammogram. This may be done every 1-2 years. Talk to your health care provider about how often you should have regular mammograms. Talk with your health care provider about your test results, treatment options, and if necessary, the need for more tests. Vaccines  Your health care provider may recommend certain vaccines, such as: Influenza vaccine. This is recommended every  year. Tetanus, diphtheria, and acellular pertussis (Tdap, Td) vaccine. You may need a Td booster every 10 years. Zoster vaccine. You may need this after age 56. Pneumococcal 13-valent conjugate (PCV13) vaccine. One dose is recommended after age 83. Pneumococcal polysaccharide (PPSV23) vaccine. One dose is recommended after age 62. Talk to your health care provider about which screenings and vaccines you need and how often you need them. This information is not intended to replace advice given to you by your health care provider. Make sure you discuss any questions you have with your health care provider. Document Released: 08/11/2015 Document Revised: 04/03/2016 Document Reviewed: 05/16/2015 Elsevier Interactive Patient Education  2017 [place] Prevention in the Home Falls can cause injuries. They can happen to people of all ages. There are many things you can do to make your home safe and to help prevent falls. What can I do on the outside of my home? Regularly fix the edges of walkways and driveways and fix any cracks. Remove anything that might make you trip as you walk through a door, such as a raised step or threshold. Trim any bushes or trees on the path to your home. Use bright outdoor lighting. Clear any walking paths of anything that might make someone trip, such as rocks or tools. Regularly check to see if handrails are loose or broken. Make sure that both sides of any steps have handrails. Any raised decks and porches should have guardrails on the edges. Have any leaves, snow, or ice cleared regularly. Use sand or salt on walking paths during winter. Clean up any spills in your garage right away. This includes oil or grease spills. What can I do in the bathroom? Use night lights. Install grab bars by the toilet and in the tub and shower. Do not use towel bars as grab bars. Use non-skid mats or decals in the tub or shower. If you need to sit down in the shower, use a  plastic, non-slip stool. Keep the floor dry. Clean up any water that spills on the floor as soon as it happens. Remove soap buildup in the tub or shower regularly. Attach bath mats securely with double-sided non-slip rug tape. Do not have throw rugs and other things on the floor that can make you trip. What can I do in the bedroom? Use night lights. Make sure that you have a light by your bed that is easy to reach. Do not use any sheets or blankets that are too big for your bed. They should not hang down onto the floor. Have a firm chair that has side arms. You can use this for support while you get dressed. Do not have throw rugs and other things on the floor that can make you trip. What can I do in the kitchen? Clean up any spills right away. Avoid walking on wet floors. Keep items that you use a lot in easy-to-reach places. If you need to reach something above you, use a strong step stool that has a grab bar. Keep electrical cords out of the way. Do not use floor polish or wax that makes floors slippery. If you must use wax, use non-skid floor wax. Do not have throw rugs and other things on the floor that can make you trip. What can I do with my stairs? Do not leave any items on the stairs. Make sure that there are  handrails on both sides of the stairs and use them. Fix handrails that are broken or loose. Make sure that handrails are as long as the stairways. Check any carpeting to make sure that it is firmly attached to the stairs. Fix any carpet that is loose or worn. Avoid having throw rugs at the top or bottom of the stairs. If you do have throw rugs, attach them to the floor with carpet tape. Make sure that you have a light switch at the top of the stairs and the bottom of the stairs. If you do not have them, ask someone to add them for you. What else can I do to help prevent falls? Wear shoes that: Do not have high heels. Have rubber bottoms. Are comfortable and fit you  well. Are closed at the toe. Do not wear sandals. If you use a stepladder: Make sure that it is fully opened. Do not climb a closed stepladder. Make sure that both sides of the stepladder are locked into place. Ask someone to hold it for you, if possible. Clearly mark and make sure that you can see: Any grab bars or handrails. First and last steps. Where the edge of each step is. Use tools that help you move around (mobility aids) if they are needed. These include: Canes. Walkers. Scooters. Crutches. Turn on the lights when you go into a dark area. Replace any light bulbs as soon as they burn out. Set up your furniture so you have a clear path. Avoid moving your furniture around. If any of your floors are uneven, fix them. If there are any pets around you, be aware of where they are. Review your medicines with your doctor. Some medicines can make you feel dizzy. This can increase your chance of falling. Ask your doctor what other things that you can do to help prevent falls. This information is not intended to replace advice given to you by your health care provider. Make sure you discuss any questions you have with your health care provider. Document Released: 05/11/2009 Document Revised: 12/21/2015 Document Reviewed: 08/19/2014 Elsevier Interactive Patient Education  2017 [employer].

## 2021-12-20 NOTE — Progress Notes (Signed)
Subjective:   Tracy [name] is a 76 y.o. female who presents for Medicare Annual (Subsequent) preventive examination. Virtual Visit via Video Note  I connected with  Tracy [name] on 12/20/21 at  2:30 PM EDT via telehealth video enabled device and verified that I am speaking with the correct person using two identifiers.  Location: Patient: HOME Provider: [facility] Persons participating in the virtual visit: patient/Nurse Health Advisor   I discussed the limitations, risks, security and privacy concerns of performing an evaluation and management service by telephone and the availability of in person appointments. The patient expressed understanding and agreed to proceed.  Some vital signs may be absent or patient reported.   Chriss Driver, LPN Review of Systems     Cardiac Risk Factors include: advanced age (>35mn, >>17women);hypertension;dyslipidemia;diabetes mellitus;sedentary lifestyle;obesity (BMI >30kg/m2)     Objective:    Today's Vitals   12/20/21 1434 12/20/21 1436  Weight: 185 lb 9.6 oz (84.2 kg)   Height: '4\' 11"'$  (1.499 m)   PainSc:  5    Body mass index is 37.49 kg/m.     12/20/2021    2:46 PM 10/12/2021    1:48 PM 05/31/2021   10:22 AM 04/30/2021    3:45 PM 03/16/2021    9:15 PM 03/15/2021    6:20 AM 03/08/2021   12:15 PM  Advanced Directives  Does Patient Have a Medical Advance Directive? Yes Yes Yes No Yes Yes Yes  Type of Advance Directive Living will HOliverLiving will   Living will Living will HChaplinLiving will  Does patient want to make changes to medical advance directive?  No - Patient declined No - Patient declined  No - Patient declined  No - Patient declined  Copy of [place]in Chart?  No - copy requested   No - copy requested No - copy requested No - copy requested  Would patient like information on creating a medical advance directive?  No - Patient declined  No -  Patient declined       Current Medications (verified) Outpatient Encounter Medications as of 12/20/2021  Medication Sig   acyclovir (ZOVIRAX) 400 MG tablet Take 400 mg by mouth 2 (two) times daily.   atorvastatin (LIPITOR) 10 MG tablet Take 1 tablet (10 mg total) by mouth daily.   azelastine (ASTELIN) 0.1 % nasal spray Place 1 spray into both nostrils 2 (two) times daily. Use in each nostril as directed   cyclobenzaprine (FLEXERIL) 5 MG tablet Take 1 tablet (5 mg total) by mouth 2 (two) times daily as needed for muscle spasms.   gabapentin (NEURONTIN) 100 MG capsule Take 2 capsules (200 mg total) by mouth 3 (three) times daily.   glucose blood (ACCU-CHEK GUIDE) test strip Use as instructed to check sugars 1-2 times daily.   hydrocortisone 2.5 % cream Apply topically 2 (two) times daily.   loratadine (CLARITIN) 10 MG tablet Take 10 mg by mouth daily.   meclizine (ANTIVERT) 25 MG tablet Take 1 tablet (25 mg total) by mouth 3 (three) times daily as needed for dizziness.   oxymetazoline (AFRIN) 0.05 % nasal spray Place 1 spray into both nostrils 2 (two) times daily as needed for congestion. Or bloody nose.   pantoprazole (PROTONIX) 40 MG tablet TAKE 1 TABLET(40 MG) BY MOUTH TWICE DAILY   traZODone (DESYREL) 100 MG tablet TAKE 1 TABLET BY MOUTH AT  BEDTIME   triamcinolone cream (KENALOG) 0.1 % Apply  1 application topically 2 (two) times daily.   Vitamin D, Ergocalciferol, (DRISDOL) 1.25 MG (50000 UNIT) CAPS capsule TAKE 1 CAPSULE BY MOUTH 1  TIME A WEEK   [DISCONTINUED] lidocaine-prilocaine (EMLA) cream APPLY TOPICALLY TO THE AFFECTED AREA AS NEEDED   Facility-Administered Encounter Medications as of 12/20/2021  Medication   sodium chloride flush (NS) 0.9 % injection 10 mL    Allergies (verified) Bacitracin-neomycin-polymyxin  [neomycin-bacitracin zn-polymyx], Nsaids, Tape, Ambien [zolpidem tartrate], Amoxicillin, Clavulanic acid, Contrast media [iodinated contrast media], Latex, Prednisone, and  Tessalon [benzonatate]   History: Past Medical History:  Diagnosis Date   Angiodysplasia of intestine 08/23/2020   Anxiety    Arthritis    Breast cancer ([place]) 06/13/15   Cancer ([place]) 2000   breast cancer   Chronic bronchitis ([place])    Chronic bronchitis ([place])    Hyperlipidemia    Hypertension    Myocardial infarction [facility]) 2001   Personal history of radiation therapy    Restless leg    Stroke ([place]) 2004   TIA, no deficits   Past Surgical History:  Procedure Laterality Date   ABDOMINAL HYSTERECTOMY  1985   BREAST LUMPECTOMY Left 2000   radiation and chemo   BREAST LUMPECTOMY Right 2016   radiation   BREAST SURGERY  2001   lt breast lumpectomy   COLONOSCOPY WITH ESOPHAGOGASTRODUODENOSCOPY (EGD)  04/2020   ENTEROSCOPY N/A 03/15/2021   Procedure: ENTEROSCOPY;  Surgeon: Milus Banister, MD;  Location: [facility];  Service: Endoscopy;  Laterality: N/A;   GIVENS CAPSULE STUDY  07/2020   HOT HEMOSTASIS N/A 03/15/2021   Procedure: HOT HEMOSTASIS (ARGON PLASMA COAGULATION/BICAP);  Surgeon: Milus Banister, MD;  Location: [facility];  Service: Endoscopy;  Laterality: N/A;   IR IMAGING GUIDED PORT INSERTION  04/25/2020   RADIOACTIVE SEED GUIDED PARTIAL MASTECTOMY WITH AXILLARY SENTINEL LYMPH NODE BIOPSY Right 07/07/2015   Procedure: RIGHT RADIOACTIVE SEED GUIDED PARTIAL MASTECTOMY WITH AXILLARY SENTINEL LYMPH NODE BIOPSY;  Surgeon: Autumn Messing III, MD;  Location: [facility];  Service: General;  Laterality: Right;   SMALL INTESTINE SURGERY     TUBAL LIGATION     UPPER GASTROINTESTINAL ENDOSCOPY     Family History  Problem Relation Age of Onset   Emphysema Mother 29       smoker   Diabetes Father    Lung cancer Sister        dx. <50; former smoker   Diabetes Brother    Diabetes Brother    Brain cancer Brother 70       unknown tumor type   Diabetes Paternal Aunt    Stroke Maternal Grandmother    Diabetes Paternal Grandmother    Cancer Daughter 63       neck  cancer   Other Daughter        hysterectomy for unspecified reason   Colon cancer Daughter    Breast cancer Cousin    Cancer Cousin        unspecified type   Breast cancer Other        triple negative breast cancer in her 82s   Colon polyps Neg Hx    Esophageal cancer Neg Hx    Gallbladder disease Neg Hx    Social History   Socioeconomic History   Marital status: Divorced    Spouse name: Not on file   Number of children: 7   Years of education: Not on file   Highest education level: Not on file  Occupational History  Occupation: retired  Tobacco Use   Smoking status: Former    Packs/day: 1.00    Years: 20.00    Pack years: 20.00    Types: Cigarettes    Quit date: 07/30/2011    Years since quitting: 10.4   Smokeless tobacco: Never   Tobacco comments:    Quit >4 years ago; 1 ppd for about 5/20 years (remaining was less)  Vaping Use   Vaping Use: Former  Substance and Sexual Activity   Alcohol use: No    Alcohol/week: 0.0 standard drinks   Drug use: No   Sexual activity: Not Currently  Other Topics Concern   Not on file  Social History Narrative   Lives alone.  Retired.  Education:  11th grade GED.  Children:  7 (one here).    Social Determinants of Health   Financial Resource Strain: High Risk   Difficulty of Paying Living Expenses: Hard  Food Insecurity: No Food Insecurity   Worried About Charity fundraiser in the Last Year: Never true   Ran Out of Food in the Last Year: Never true  Transportation Needs: No Transportation Needs   Lack of Transportation (Medical): No   Lack of Transportation (Non-Medical): No  Physical Activity: Inactive   Days of Exercise per Week: 0 days   Minutes of Exercise per Session: 0 min  Stress: No Stress Concern Present   Feeling of Stress : Not at all  Social Connections: Moderately Integrated   Frequency of Communication with Friends and Family: More than three times a week   Frequency of Social Gatherings with Friends and  Family: More than three times a week   Attends Religious Services: More than 4 times per year   Active Member of [employer] or Organizations: Yes   Attends Music therapist: More than 4 times per year   Marital Status: Divorced    Tobacco Counseling Counseling given: Not Answered Tobacco comments: Quit >4 years ago; 1 ppd for about 5/20 years (remaining was less)   Clinical Intake:  Pre-visit preparation completed: Yes  Pain : 0-10 Pain Score: 5  Pain Type: Chronic pain Pain Location: Shoulder Pain Descriptors / Indicators: Aching, Dull Pain Onset: 1 to 4 weeks ago Pain Frequency: Intermittent     BMI - recorded: 37.49 Nutritional Status: BMI > 30  Obese Nutritional Risks: None Diabetes: Yes  How often do you need to have someone help you when you read instructions, pamphlets, or other written materials from your doctor or pharmacy?: 1 - Never  Diabetic?Nutrition Risk Assessment:  Has the patient had any N/V/D within the last 2 months?  No  Does the patient have any non-healing wounds?  No  Has the patient had any unintentional weight loss or weight gain?  No   Diabetes:  Is the patient diabetic?  Yes  If diabetic, was a CBG obtained today?  No  Did the patient bring in their glucometer from home?  No  How often do you monitor your CBG's? Checks A1C at office.   Financial Strains and Diabetes Management:  Are you having any financial strains with the device, your supplies or your medication? No .  Does the patient want to be seen by Chronic Care Management for management of their diabetes?  No  Would the patient like to be referred to a Nutritionist or for Diabetic Management?  No   Diabetic Exams:  Diabetic Eye Exam: Completed 12/18/2021. Marland Kitchen Pt has been advised about the importance in completing  this exam.  Diabetic Foot Exam: Completed 09/24/2021. Pt has been advised about the importance in completing this exam.   Interpreter Needed?: No  Information  entered by :: mj Deeandra Jerry, lpn   Activities of Daily Living    12/20/2021    2:49 PM 09/24/2021    9:54 AM  In your present state of health, do you have any difficulty performing the following activities:  Hearing? 0 0  Vision? 0 0  Difficulty concentrating or making decisions? 0 0  Walking or climbing stairs? 0 1  Dressing or bathing? 0 0  Doing errands, shopping? 0 0  Preparing Food and eating ? N   Using the Toilet? N   In the past six months, have you accidently leaked urine? Y   Do you have problems with loss of bowel control? N   Managing your Medications? N   Managing your Finances? N   Housekeeping or managing your Housekeeping? N     Patient Care Team: [name], MD as PCP - General (Family Medicine) [name], MD as Attending Physician (Physical Medicine and Rehabilitation) [name], MD as Consulting Physician (Orthopedic Surgery) [name], MD as Consulting Physician (Otolaryngology) [name], MD as Consulting Physician (Gastroenterology) [name], MD as Consulting Physician (General Surgery) [name], [facility] as Pharmacist (Pharmacist)  Indicate any recent Medical Services you may have received from other than [facility] providers in the past year (date may be approximate).     Assessment:   This is a routine wellness examination for [facility].  Hearing/Vision screen Hearing Screening - Comments:: No hearing issues.   Vision Screening - Comments:: Glasses. Dr. Jerline Pain at [facility]. 12/18/2021.  Dietary issues and exercise activities discussed: Current Exercise Habits: The patient does not participate in regular exercise at present, Exercise limited by: cardiac condition(s);orthopedic condition(s)   Goals Addressed             This Visit's Progress    Patient Stated   On track    Drink more water       Depression Screen    12/20/2021    2:42 PM 09/24/2021    9:55 AM 09/03/2021   10:02 AM 08/22/2021   11:49 AM 08/13/2021     4:09 PM 05/29/2021    8:38 AM 05/04/2021    9:36 AM  PHQ 2/9 Scores  PHQ - 2 Score 0 0 0 0 0 0 3  PHQ- 9 Score  '3 3 4 6 '$ 0 15    Fall Risk    12/20/2021    2:47 PM 11/07/2021    3:24 PM 09/24/2021    9:55 AM 09/03/2021   10:02 AM 08/22/2021   11:50 AM  Fall Risk   Falls in the past year? 0 0 0 0 0  Number falls in past yr: 0 0   0  Injury with Fall? 0 0     Risk for fall due to : Impaired balance/gait;Medication side effect No Fall Risks No Fall Risks No Fall Risks   Follow up Falls prevention discussed Falls evaluation completed Falls evaluation completed Falls evaluation completed     [place]:  Any stairs in or around the home? Yes  If so, are there any without handrails? No  Home free of loose throw rugs in walkways, pet beds, electrical cords, etc? Yes  Adequate lighting in your home to reduce risk of falls? Yes   ASSISTIVE DEVICES UTILIZED TO PREVENT FALLS:  Life  alert? No  Use of a cane, walker or w/c? Yes  Grab bars in the bathroom? No  Shower chair or bench in shower? Yes  Elevated toilet seat or a handicapped toilet? No   TIMED UP AND GO:  Was the test performed? No .  Phone visit.   Cognitive Function:        12/20/2021    2:51 PM  6CIT Screen  What Year? 0 points  What month? 0 points  What time? 0 points  Count back from 20 0 points  Months in reverse 0 points  Repeat phrase 0 points  Total Score 0 points    Immunizations Immunization History  Administered Date(s) Administered   Fluad Quad(high Dose 65+) 05/31/2020   Influenza Split 04/28/2010, 06/24/2011   Influenza Whole 04/22/2013   Influenza, High Dose Seasonal PF 05/30/2016, 05/20/2018, 04/09/2019   Influenza,inj,Quad PF,6+ Mos 04/29/2015, 05/16/2017   Influenza-Unspecified 05/03/2014   Moderna Sars-Covid-2 Vaccination 09/27/2019, 10/25/2019, 06/05/2020   Pneumococcal Conjugate-13 06/01/2015   Pneumococcal Polysaccharide-23 05/10/2014   Td 04/05/2013    Zoster, Live 05/01/2010    TDAP status: Up to date  Flu Vaccine status: Up to date  Pneumococcal vaccine status: Up to date  Covid-19 vaccine status: Completed vaccines  Qualifies for Shingles Vaccine?  UNABLE TO RECEIVE DUE TO CHEMOTHERAPY TX.   Zostavax completed No   Shingrix Completed?: No.    Education has been provided regarding the importance of this vaccine. Patient has been advised to call insurance company to determine out of pocket expense if they have not yet received this vaccine. Advised may also receive vaccine at local pharmacy or Health Dept. Verbalized acceptance and understanding.  Screening Tests Health Maintenance  Topic Date Due   Zoster Vaccines- Shingrix (1 of 2) 12/22/2021 (Originally 03/28/1965)   COVID-19 Vaccine (4 - Booster for Moderna series) 01/05/2022 (Originally 07/31/2020)   OPHTHALMOLOGY EXAM  07/26/2022 (Originally 05/19/2020)   INFLUENZA VACCINE  02/26/2022   HEMOGLOBIN A1C  03/24/2022   FOOT EXAM  09/24/2022   TETANUS/TDAP  04/06/2023   Pneumonia Vaccine 47+ Years old  Completed   DEXA SCAN  Completed   Hepatitis C Screening  Completed   HPV VACCINES  Aged Out   COLONOSCOPY (Pts 45-66yr Insurance coverage will need to be confirmed)  Discontinued    Health Maintenance  There are no preventive care reminders to display for this patient.   Colorectal cancer screening: No longer required.   Mammogram status: Completed 09/21/2021. Repeat every year  Bone Density status: Completed 01/30/2021. Results reflect: Bone density results: OSTEOPENIA. Repeat every 2 years.  Lung Cancer Screening: (Low Dose CT Chest recommended if Age 78-80years, 30 pack-year currently smoking OR have quit w/in 15years.) does qualify.   Lung Cancer Screening Referral: Pt declined at this time.  Additional Screening:  Hepatitis C Screening: does qualify; Completed 02/18/2020  Vision Screening: Recommended annual ophthalmology exams for early detection of glaucoma and  other disorders of the eye. Is the patient up to date with their annual eye exam?  Yes  Who is the provider or what is the name of the office in which the patient attends annual eye exams? [facility] If pt is not established with a provider, would they like to be referred to a provider to establish care? No .   Dental Screening: Recommended annual dental exams for proper oral hygiene  Community Resource Referral / Chronic Care Management: CRR required this visit?  Yes   CCM required this visit?  No      Plan:     I have personally reviewed and noted the following in the patient's chart:   Medical and social history Use of alcohol, tobacco or illicit drugs  Current medications and supplements including opioid prescriptions.  Functional ability and status Nutritional status Physical activity Advanced directives List of other physicians Hospitalizations, surgeries, and ER visits in previous 12 months Vitals Screenings to include cognitive, depression, and falls Referrals and appointments  In addition, I have reviewed and discussed with patient certain preventive protocols, quality metrics, and best practice recommendations. A written personalized care plan for preventive services as well as general preventive health recommendations were provided to patient.     Chriss Driver, LPN   2/00/3794   Nurse Notes: Pt is unable to receive Shingrix due to current Chemotherapy tx. Pt c/o some issues with being able to afford food. Discussed CRR referral. Pt is agreeable and referral made.

## 2021-12-21 ENCOUNTER — Telehealth: Payer: Self-pay

## 2021-12-21 NOTE — Telephone Encounter (Signed)
   Telephone encounter was:  Successful.  12/21/2021 Name: Tracy [name] MRN: [id number] DOB: [birth year]  Doneen Poisson Caldas is a 76 y.o. year old female who is a primary care patient of Birdie Riddle, Aundra Millet, MD . The community resource team was consulted for assistance with Food Insecurity and Financial Difficulties related to financial strain  Care guide performed the following interventions: Patient provided with information about care guide support team and interviewed to confirm resource needs.Patient stated she is having a hard time buying food her food stamps had ended and hasnt gotten anything since may. Patient asked me to mail resources to her  Follow Up Plan:  Care guide will follow up with patient by phone over the next two weeks    [facility]  [phone] [address], [place], [place] [address] Phone: [phone] Email: [name]

## 2021-12-21 NOTE — Telephone Encounter (Signed)
Mailing letter for [place], [facility]  [phone] [address], [place], [place] [address] Phone: [phone] Email: [name]

## 2021-12-31 ENCOUNTER — Other Ambulatory Visit: Payer: Self-pay

## 2021-12-31 ENCOUNTER — Telehealth: Payer: Self-pay | Admitting: Family Medicine

## 2021-12-31 MED ORDER — PANTOPRAZOLE SODIUM 40 MG PO TBEC: DELAYED_RELEASE_TABLET | ORAL | 0 refills | Status: DC

## 2021-12-31 NOTE — Telephone Encounter (Signed)
Pt call in asking for a refill on the  atorvastatin pt uses [facility] pt only has 3 pill left    Please advise    Pt states the pharmacy told her that we denied the medication request

## 2021-12-31 NOTE — Telephone Encounter (Signed)
Medication has been sent to the pharmacy. 

## 2022-01-01 ENCOUNTER — Other Ambulatory Visit: Payer: Self-pay

## 2022-01-01 DIAGNOSIS — E785 Hyperlipidemia, unspecified: Secondary | ICD-10-CM

## 2022-01-01 MED ORDER — ATORVASTATIN CALCIUM 10 MG PO TABS: 10.0000 mg | ORAL_TABLET | Freq: Every day | ORAL | 1 refills | Status: DC

## 2022-01-01 NOTE — Telephone Encounter (Signed)
Patient called back - stated her atorvastatin medication has still not been called in to her [facility]-   Patient is asking if we can please send this over again to the [phone] [facility]-  Patient only has 3 pills left. -   Thanks!

## 2022-01-04 ENCOUNTER — Inpatient Hospital Stay: Payer: Medicare Other

## 2022-01-04 ENCOUNTER — Other Ambulatory Visit: Payer: Self-pay

## 2022-01-04 ENCOUNTER — Inpatient Hospital Stay: Payer: Medicare Other | Attending: Hematology

## 2022-01-04 VITALS — BP 180/79 | HR 79 | Temp 98.0°F | Resp 18 | Wt 184.5 lb

## 2022-01-04 DIAGNOSIS — N189 Chronic kidney disease, unspecified: Secondary | ICD-10-CM | POA: Insufficient documentation

## 2022-01-04 DIAGNOSIS — E538 Deficiency of other specified B group vitamins: Secondary | ICD-10-CM | POA: Diagnosis not present

## 2022-01-04 DIAGNOSIS — Z7189 Other specified counseling: Secondary | ICD-10-CM

## 2022-01-04 DIAGNOSIS — E1122 Type 2 diabetes mellitus with diabetic chronic kidney disease: Secondary | ICD-10-CM | POA: Insufficient documentation

## 2022-01-04 DIAGNOSIS — E1140 Type 2 diabetes mellitus with diabetic neuropathy, unspecified: Secondary | ICD-10-CM | POA: Insufficient documentation

## 2022-01-04 DIAGNOSIS — Z87891 Personal history of nicotine dependence: Secondary | ICD-10-CM | POA: Insufficient documentation

## 2022-01-04 DIAGNOSIS — C9000 Multiple myeloma not having achieved remission: Secondary | ICD-10-CM

## 2022-01-04 DIAGNOSIS — Z79899 Other long term (current) drug therapy: Secondary | ICD-10-CM | POA: Diagnosis not present

## 2022-01-04 DIAGNOSIS — Z5112 Encounter for antineoplastic immunotherapy: Secondary | ICD-10-CM | POA: Insufficient documentation

## 2022-01-04 DIAGNOSIS — D509 Iron deficiency anemia, unspecified: Secondary | ICD-10-CM | POA: Insufficient documentation

## 2022-01-04 DIAGNOSIS — Z923 Personal history of irradiation: Secondary | ICD-10-CM | POA: Diagnosis not present

## 2022-01-04 DIAGNOSIS — I252 Old myocardial infarction: Secondary | ICD-10-CM | POA: Insufficient documentation

## 2022-01-04 DIAGNOSIS — Z8673 Personal history of transient ischemic attack (TIA), and cerebral infarction without residual deficits: Secondary | ICD-10-CM | POA: Insufficient documentation

## 2022-01-04 DIAGNOSIS — Z95828 Presence of other vascular implants and grafts: Secondary | ICD-10-CM

## 2022-01-04 DIAGNOSIS — C7951 Secondary malignant neoplasm of bone: Secondary | ICD-10-CM

## 2022-01-04 DIAGNOSIS — Z853 Personal history of malignant neoplasm of breast: Secondary | ICD-10-CM | POA: Diagnosis not present

## 2022-01-04 LAB — CMP (CANCER CENTER ONLY)
ALT: 11 U/L (ref 0–44)
AST: 15 U/L (ref 15–41)
Albumin: 4.2 g/dL (ref 3.5–5.0)
Alkaline Phosphatase: 65 U/L (ref 38–126)
Anion gap: 11 (ref 5–15)
BUN: 13 mg/dL (ref 8–23)
CO2: 23 mmol/L (ref 22–32)
Calcium: 8.8 mg/dL — ABNORMAL LOW (ref 8.9–10.3)
Chloride: 110 mmol/L (ref 98–111)
Creatinine: 1.27 mg/dL — ABNORMAL HIGH (ref 0.44–1.00)
GFR, Estimated: 44 mL/min — ABNORMAL LOW (ref >60)
Glucose, Bld: 185 mg/dL — ABNORMAL HIGH (ref 70–99)
Potassium: 3.6 mmol/L (ref 3.5–5.1)
Sodium: 144 mmol/L (ref 135–145)
Total Bilirubin: 0.3 mg/dL (ref 0.3–1.2)
Total Protein: 6.4 g/dL — ABNORMAL LOW (ref 6.5–8.1)

## 2022-01-04 LAB — CBC WITH DIFFERENTIAL (CANCER CENTER ONLY)
Abs Immature Granulocytes: 0.01 10*3/uL (ref 0.00–0.07)
Basophils Absolute: 0 10*3/uL (ref 0.0–0.1)
Basophils Relative: 1 %
Eosinophils Absolute: 0.1 10*3/uL (ref 0.0–0.5)
Eosinophils Relative: 3 %
HCT: 30.2 % — ABNORMAL LOW (ref 36.0–46.0)
Hemoglobin: 10.2 g/dL — ABNORMAL LOW (ref 12.0–15.0)
Immature Granulocytes: 0 %
Lymphocytes Relative: 36 %
Lymphs Abs: 1.7 10*3/uL (ref 0.7–4.0)
MCH: 31.8 pg (ref 26.0–34.0)
MCHC: 33.8 g/dL (ref 30.0–36.0)
MCV: 94.1 fL (ref 80.0–100.0)
Monocytes Absolute: 0.2 10*3/uL (ref 0.1–1.0)
Monocytes Relative: 5 %
Neutro Abs: 2.6 10*3/uL (ref 1.7–7.7)
Neutrophils Relative %: 55 %
Platelet Count: 256 10*3/uL (ref 150–400)
RBC: 3.21 MIL/uL — ABNORMAL LOW (ref 3.87–5.11)
RDW: 14.4 % (ref 11.5–15.5)
WBC Count: 4.7 10*3/uL (ref 4.0–10.5)
nRBC: 0 % (ref 0.0–0.2)

## 2022-01-04 MED ORDER — HEPARIN SOD (PORK) LOCK FLUSH 100 UNIT/ML IV SOLN
500.0000 [IU] | Freq: Once | INTRAVENOUS | Status: AC | PRN
  Administered 2022-01-04: 500 [IU]

## 2022-01-04 MED ORDER — ALBUTEROL SULFATE (2.5 MG/3ML) 0.083% IN NEBU: 2.5000 mg | INHALATION_SOLUTION | Freq: Once | RESPIRATORY_TRACT | Status: DC | PRN

## 2022-01-04 MED ORDER — ALTEPLASE 2 MG IJ SOLR: 2.0000 mg | Freq: Once | INTRAMUSCULAR | Status: DC | PRN

## 2022-01-04 MED ORDER — SODIUM CHLORIDE 0.9 % IV SOLN: Freq: Once | INTRAVENOUS | Status: DC | PRN

## 2022-01-04 MED ORDER — FAMOTIDINE IN NACL 20-0.9 MG/50ML-% IV SOLN: 20.0000 mg | Freq: Once | INTRAVENOUS | Status: DC | PRN

## 2022-01-04 MED ORDER — HEPARIN SOD (PORK) LOCK FLUSH 100 UNIT/ML IV SOLN: 500.0000 [IU] | Freq: Once | INTRAVENOUS | Status: DC

## 2022-01-04 MED ORDER — SODIUM CHLORIDE 0.9% FLUSH
10.0000 mL | INTRAVENOUS | Status: DC | PRN
  Administered 2022-01-04: 10 mL

## 2022-01-04 MED ORDER — EPINEPHRINE 0.3 MG/0.3ML IJ SOAJ: 0.3000 mg | Freq: Once | INTRAMUSCULAR | Status: DC | PRN

## 2022-01-04 MED ORDER — METHYLPREDNISOLONE SODIUM SUCC 125 MG IJ SOLR: 125.0000 mg | Freq: Once | INTRAMUSCULAR | Status: DC | PRN

## 2022-01-04 MED ORDER — HEPARIN SOD (PORK) LOCK FLUSH 100 UNIT/ML IV SOLN: 250.0000 [IU] | Freq: Once | INTRAVENOUS | Status: DC | PRN

## 2022-01-04 MED ORDER — ACETAMINOPHEN 325 MG PO TABS
650.0000 mg | ORAL_TABLET | Freq: Once | ORAL | Status: AC
  Administered 2022-01-04: 650 mg via ORAL
  Filled 2022-01-04: qty 2

## 2022-01-04 MED ORDER — SODIUM CHLORIDE 0.9 % IV SOLN
300.0000 mg | Freq: Once | INTRAVENOUS | Status: AC
  Administered 2022-01-04: 300 mg via INTRAVENOUS
  Filled 2022-01-04: qty 300

## 2022-01-04 MED ORDER — SODIUM CHLORIDE 0.9 % IV SOLN: Freq: Once | INTRAVENOUS | Status: DC

## 2022-01-04 MED ORDER — SODIUM CHLORIDE 0.9% FLUSH: 10.0000 mL | Freq: Once | INTRAVENOUS | Status: DC

## 2022-01-04 MED ORDER — SODIUM CHLORIDE 0.9% FLUSH: 10.0000 mL | Freq: Once | INTRAVENOUS | Status: DC | PRN

## 2022-01-04 MED ORDER — LORATADINE 10 MG PO TABS
10.0000 mg | ORAL_TABLET | Freq: Once | ORAL | Status: AC
  Administered 2022-01-04: 10 mg via ORAL
  Filled 2022-01-04: qty 1

## 2022-01-04 MED ORDER — DIPHENHYDRAMINE HCL 50 MG/ML IJ SOLN: 50.0000 mg | Freq: Once | INTRAMUSCULAR | Status: DC | PRN

## 2022-01-04 MED ORDER — SODIUM CHLORIDE 0.9% FLUSH: 3.0000 mL | Freq: Once | INTRAVENOUS | Status: DC | PRN

## 2022-01-04 MED ORDER — SODIUM CHLORIDE 0.9 % IV SOLN
20.0000 mg | Freq: Once | INTRAVENOUS | Status: AC
  Administered 2022-01-04: 20 mg via INTRAVENOUS
  Filled 2022-01-04: qty 20

## 2022-01-04 MED ORDER — HEPARIN SOD (PORK) LOCK FLUSH 100 UNIT/ML IV SOLN: 500.0000 [IU] | Freq: Once | INTRAVENOUS | Status: DC | PRN

## 2022-01-04 MED ORDER — FAMOTIDINE IN NACL 20-0.9 MG/50ML-% IV SOLN
20.0000 mg | Freq: Once | INTRAVENOUS | Status: AC
  Administered 2022-01-04: 20 mg via INTRAVENOUS
  Filled 2022-01-04: qty 50

## 2022-01-04 MED ORDER — SODIUM CHLORIDE 0.9 % IV SOLN
16.0000 mg/kg | Freq: Once | INTRAVENOUS | Status: AC
  Administered 2022-01-04: 1400 mg via INTRAVENOUS
  Filled 2022-01-04: qty 60

## 2022-01-04 MED ORDER — SODIUM CHLORIDE 0.9 % IV SOLN: Freq: Once | INTRAVENOUS | Status: AC

## 2022-01-04 MED ORDER — CYANOCOBALAMIN 1000 MCG/ML IJ SOLN
1000.0000 ug | Freq: Once | INTRAMUSCULAR | Status: AC
  Administered 2022-01-04: 1000 ug via INTRAMUSCULAR
  Filled 2022-01-04: qty 1

## 2022-01-04 MED ORDER — SODIUM CHLORIDE 0.9% FLUSH
10.0000 mL | Freq: Once | INTRAVENOUS | Status: AC
  Administered 2022-01-04: 10 mL

## 2022-01-04 MED ORDER — DIPHENHYDRAMINE HCL 25 MG PO CAPS
50.0000 mg | ORAL_CAPSULE | Freq: Once | ORAL | Status: AC
  Administered 2022-01-04: 50 mg via ORAL
  Filled 2022-01-04: qty 2

## 2022-01-04 MED ORDER — ACETAMINOPHEN 325 MG PO TABS: 650.0000 mg | ORAL_TABLET | Freq: Once | ORAL | Status: DC

## 2022-01-04 NOTE — Patient Instructions (Signed)
[facility]  Discharge Instructions: ?Thank you for choosing [facility] to provide your oncology and hematology care.  ? ?If you have a lab appointment with the Cancer Center, please go directly to the Cancer Center and check in at the registration area. ?  ?Wear comfortable clothing and clothing appropriate for easy access to any Portacath or PICC line.  ? ?We strive to give you quality time with your provider. You may need to reschedule your appointment if you arrive late (15 or more minutes).  Arriving late affects you and other patients whose appointments are after yours.  Also, if you miss three or more appointments without notifying the office, you may be dismissed from the clinic at the provider?s discretion.    ?  ?For prescription refill requests, have your pharmacy contact our office and allow 72 hours for refills to be completed.   ? ?Today you received the following chemotherapy and/or immunotherapy agents: Darzalex, Venofer, B-12 ?  ?To help prevent nausea and vomiting after your treatment, we encourage you to take your nausea medication as directed. ? ?BELOW ARE SYMPTOMS THAT SHOULD BE REPORTED IMMEDIATELY: ?*FEVER GREATER THAN 100.4 F (38 ?C) OR HIGHER ?*CHILLS OR SWEATING ?*NAUSEA AND VOMITING THAT IS NOT CONTROLLED WITH YOUR NAUSEA MEDICATION ?*UNUSUAL SHORTNESS OF BREATH ?*UNUSUAL BRUISING OR BLEEDING ?*URINARY PROBLEMS (pain or burning when urinating, or frequent urination) ?*BOWEL PROBLEMS (unusual diarrhea, constipation, pain near the anus) ?TENDERNESS IN MOUTH AND THROAT WITH OR WITHOUT PRESENCE OF ULCERS (sore throat, sores in mouth, or a toothache) ?UNUSUAL RASH, SWELLING OR PAIN  ?UNUSUAL VAGINAL DISCHARGE OR ITCHING  ? ?Items with * indicate a potential emergency and should be followed up as soon as possible or go to the Emergency Department if any problems should occur. ? ?Please show the CHEMOTHERAPY ALERT CARD or IMMUNOTHERAPY ALERT CARD at  check-in to the Emergency Department and triage nurse. ? ?Should you have questions after your visit or need to cancel or reschedule your appointment, please contact [facility]  Dept: [phone]  and follow the prompts.  Office hours are 8:00 a.m. to 4:30 p.m. Monday - Friday. Please note that voicemails left after 4:00 p.m. may not be returned until the following business day.  We are closed weekends and major holidays. You have access to a nurse at all times for urgent questions. Please call the main number to the clinic Dept: [phone] and follow the prompts. ? ? ?For any non-urgent questions, you may also contact your provider using MyChart. We now offer e-Visits for anyone 18 and older to request care online for non-urgent symptoms. For details visit [email or web address]. ?  ?Also download the MyChart app! Go to the app store, search "MyChart", open the app, select [facility], and log in with your MyChart username and password. ? ?Due to Covid, a mask is required upon entering the hospital/clinic. If you do not have a mask, one will be given to you upon arrival. For doctor visits, patients may have 1 support person aged 18 or older with them. For treatment visits, patients cannot have anyone with them due to current Covid guidelines and our immunocompromised population.  ? ?Vitamin B12 Injection ?What is this medication? ?Vitamin B12 (VAHY tuh min B12) prevents and treats low vitamin B12 levels in your body. It is used in people who do not get enough vitamin B12 from their diet or when their digestive tract does not absorb enough. Vitamin B12 plays   an important role in maintaining the health of your nervous system and red blood cells. ?This medicine may be used for other purposes; ask your health care provider or pharmacist if you have questions. ?COMMON BRAND NAME(S): B-12 Compliance Kit, B-12 Injection Kit, Cyomin, Dodex, LA-12, Nutri-Twelve, Physicians EZ Use B-12,  Primabalt ?What should I tell my care team before I take this medication? ?They need to know if you have any of these conditions: ?Kidney disease ?Leber's disease ?Megaloblastic anemia ?An unusual or allergic reaction to cyanocobalamin, cobalt, other medications, foods, dyes, or preservatives ?Pregnant or trying to get pregnant ?Breast-feeding ?How should I use this medication? ?This medication is injected into a muscle or deeply under the skin. It is usually given in a clinic or care team's office. However, your care team may teach you how to inject yourself. Follow all instructions. ?Talk to your care team about the use of this medication in children. Special care may be needed. ?Overdosage: If you think you have taken too much of this medicine contact a poison control center or emergency room at once. ?NOTE: This medicine is only for you. Do not share this medicine with others. ?What if I miss a dose? ?If you are given your dose at a clinic or care team's office, call to reschedule your appointment. If you give your own injections, and you miss a dose, take it as soon as you can. If it is almost time for your next dose, take only that dose. Do not take double or extra doses. ?What may interact with this medication? ?Colchicine ?Heavy alcohol intake ?This list may not describe all possible interactions. Give your health care provider a list of all the medicines, herbs, non-prescription drugs, or dietary supplements you use. Also tell them if you smoke, drink alcohol, or use illegal drugs. Some items may interact with your medicine. ?What should I watch for while using this medication? ?Visit your care team regularly. You may need blood work done while you are taking this medication. ?You may need to follow a special diet. Talk to your care team. Limit your alcohol intake and avoid smoking to get the best benefit. ?What side effects may I notice from receiving this medication? ?Side effects that you should report  to your care team as soon as possible: ?Allergic reactions--skin rash, itching, hives, swelling of the face, lips, tongue, or throat ?Swelling of the ankles, hands, or feet ?Trouble breathing ?Side effects that usually do not require medical attention (report to your care team if they continue or are bothersome): ?Diarrhea ?This list may not describe all possible side effects. Call your doctor for medical advice about side effects. You may report side effects to FDA at 1-800-FDA-1088. ?Where should I keep my medication? ?Keep out of the reach of children. ?Store at room temperature between 15 and 30 degrees C (59 and 85 degrees F). Protect from light. Throw away any unused medication after the expiration date. ?NOTE: This sheet is a summary. It may not cover all possible information. If you have questions about this medicine, talk to your doctor, pharmacist, or health care provider. ?? 2022 Elsevier/Gold Standard (2020-09-27 00:00:00) ? ?

## 2022-01-06 ENCOUNTER — Other Ambulatory Visit: Payer: Self-pay | Admitting: Family Medicine

## 2022-01-06 DIAGNOSIS — E785 Hyperlipidemia, unspecified: Secondary | ICD-10-CM

## 2022-01-08 ENCOUNTER — Other Ambulatory Visit: Payer: Self-pay | Admitting: Family Medicine

## 2022-01-08 DIAGNOSIS — E785 Hyperlipidemia, unspecified: Secondary | ICD-10-CM

## 2022-01-10 ENCOUNTER — Inpatient Hospital Stay: Payer: Medicare Other

## 2022-01-10 ENCOUNTER — Other Ambulatory Visit: Payer: Self-pay

## 2022-01-10 VITALS — BP 151/66 | HR 67 | Temp 98.1°F | Resp 20

## 2022-01-10 DIAGNOSIS — C7951 Secondary malignant neoplasm of bone: Secondary | ICD-10-CM

## 2022-01-10 DIAGNOSIS — C9000 Multiple myeloma not having achieved remission: Secondary | ICD-10-CM

## 2022-01-10 DIAGNOSIS — E538 Deficiency of other specified B group vitamins: Secondary | ICD-10-CM | POA: Diagnosis not present

## 2022-01-10 DIAGNOSIS — Z87891 Personal history of nicotine dependence: Secondary | ICD-10-CM | POA: Diagnosis not present

## 2022-01-10 DIAGNOSIS — E1122 Type 2 diabetes mellitus with diabetic chronic kidney disease: Secondary | ICD-10-CM | POA: Diagnosis not present

## 2022-01-10 DIAGNOSIS — N189 Chronic kidney disease, unspecified: Secondary | ICD-10-CM | POA: Diagnosis not present

## 2022-01-10 DIAGNOSIS — Z853 Personal history of malignant neoplasm of breast: Secondary | ICD-10-CM | POA: Diagnosis not present

## 2022-01-10 DIAGNOSIS — D509 Iron deficiency anemia, unspecified: Secondary | ICD-10-CM | POA: Diagnosis not present

## 2022-01-10 DIAGNOSIS — Z5112 Encounter for antineoplastic immunotherapy: Secondary | ICD-10-CM | POA: Diagnosis not present

## 2022-01-10 DIAGNOSIS — Z8673 Personal history of transient ischemic attack (TIA), and cerebral infarction without residual deficits: Secondary | ICD-10-CM | POA: Diagnosis not present

## 2022-01-10 DIAGNOSIS — Z79899 Other long term (current) drug therapy: Secondary | ICD-10-CM | POA: Diagnosis not present

## 2022-01-10 DIAGNOSIS — E1140 Type 2 diabetes mellitus with diabetic neuropathy, unspecified: Secondary | ICD-10-CM | POA: Diagnosis not present

## 2022-01-10 DIAGNOSIS — I252 Old myocardial infarction: Secondary | ICD-10-CM | POA: Diagnosis not present

## 2022-01-10 DIAGNOSIS — Z923 Personal history of irradiation: Secondary | ICD-10-CM | POA: Diagnosis not present

## 2022-01-10 DIAGNOSIS — Z95828 Presence of other vascular implants and grafts: Secondary | ICD-10-CM

## 2022-01-10 LAB — CBC WITH DIFFERENTIAL (CANCER CENTER ONLY)
Abs Immature Granulocytes: 0.01 10*3/uL (ref 0.00–0.07)
Basophils Absolute: 0 10*3/uL (ref 0.0–0.1)
Basophils Relative: 1 %
Eosinophils Absolute: 0.1 10*3/uL (ref 0.0–0.5)
Eosinophils Relative: 1 %
HCT: 30.8 % — ABNORMAL LOW (ref 36.0–46.0)
Hemoglobin: 10.3 g/dL — ABNORMAL LOW (ref 12.0–15.0)
Immature Granulocytes: 0 %
Lymphocytes Relative: 37 %
Lymphs Abs: 1.9 10*3/uL (ref 0.7–4.0)
MCH: 31.3 pg (ref 26.0–34.0)
MCHC: 33.4 g/dL (ref 30.0–36.0)
MCV: 93.6 fL (ref 80.0–100.0)
Monocytes Absolute: 0.3 10*3/uL (ref 0.1–1.0)
Monocytes Relative: 6 %
Neutro Abs: 2.7 10*3/uL (ref 1.7–7.7)
Neutrophils Relative %: 55 %
Platelet Count: 275 10*3/uL (ref 150–400)
RBC: 3.29 MIL/uL — ABNORMAL LOW (ref 3.87–5.11)
RDW: 14.4 % (ref 11.5–15.5)
WBC Count: 5 10*3/uL (ref 4.0–10.5)
nRBC: 0 % (ref 0.0–0.2)

## 2022-01-10 LAB — CMP (CANCER CENTER ONLY)
ALT: 10 U/L (ref 0–44)
AST: 11 U/L — ABNORMAL LOW (ref 15–41)
Albumin: 4.1 g/dL (ref 3.5–5.0)
Alkaline Phosphatase: 65 U/L (ref 38–126)
Anion gap: 10 (ref 5–15)
BUN: 13 mg/dL (ref 8–23)
CO2: 26 mmol/L (ref 22–32)
Calcium: 9.2 mg/dL (ref 8.9–10.3)
Chloride: 106 mmol/L (ref 98–111)
Creatinine: 1.29 mg/dL — ABNORMAL HIGH (ref 0.44–1.00)
GFR, Estimated: 43 mL/min — ABNORMAL LOW (ref >60)
Glucose, Bld: 122 mg/dL — ABNORMAL HIGH (ref 70–99)
Potassium: 3.7 mmol/L (ref 3.5–5.1)
Sodium: 142 mmol/L (ref 135–145)
Total Bilirubin: 0.3 mg/dL (ref 0.3–1.2)
Total Protein: 6.5 g/dL (ref 6.5–8.1)

## 2022-01-10 MED ORDER — HEPARIN SOD (PORK) LOCK FLUSH 100 UNIT/ML IV SOLN
500.0000 [IU] | Freq: Once | INTRAVENOUS | Status: AC | PRN
  Administered 2022-01-10: 500 [IU]

## 2022-01-10 MED ORDER — SODIUM CHLORIDE 0.9 % IV SOLN
60.0000 mg | Freq: Once | INTRAVENOUS | Status: AC
  Administered 2022-01-10: 60 mg via INTRAVENOUS
  Filled 2022-01-10: qty 10

## 2022-01-10 MED ORDER — HEPARIN SOD (PORK) LOCK FLUSH 100 UNIT/ML IV SOLN: 250.0000 [IU] | Freq: Once | INTRAVENOUS | Status: DC | PRN

## 2022-01-10 MED ORDER — ALTEPLASE 2 MG IJ SOLR: 2.0000 mg | Freq: Once | INTRAMUSCULAR | Status: DC | PRN

## 2022-01-10 MED ORDER — SODIUM CHLORIDE 0.9% FLUSH
3.0000 mL | Freq: Once | INTRAVENOUS | Status: AC | PRN
  Administered 2022-01-10: 3 mL

## 2022-01-10 MED ORDER — SODIUM CHLORIDE 0.9% FLUSH
10.0000 mL | Freq: Once | INTRAVENOUS | Status: AC
  Administered 2022-01-10: 10 mL

## 2022-01-10 MED ORDER — SODIUM CHLORIDE 0.9 % IV SOLN: Freq: Once | INTRAVENOUS | Status: AC

## 2022-01-10 NOTE — Patient Instructions (Signed)
Pamidronate Injection What is this medication? PAMIDRONATE (pa mi DROE nate) treats high calcium levels in the blood caused by cancer. It may also be used with chemotherapy to treat weakened bones caused by cancer. It can also be used to treat Paget's disease of the bone. It works by slowing down the release of calcium from bones. This lowers calcium levels in your blood. It also makes your bones stronger and less likely to break (fracture). It belongs to a group of medications called bisphosphonates. This medicine may be used for other purposes; ask your health care provider or pharmacist if you have questions. COMMON BRAND NAME(S): Aredia What should I tell my care team before I take this medication? They need to know if you have any of these conditions: Bleeding disorder Cancer Dental disease Kidney disease Low levels of calcium or other minerals in the blood Low red blood cell counts Receiving steroids, such as dexamethasone or prednisone An unusual or allergic reaction to pamidronate, other medications, foods, dyes or preservatives Pregnant or trying to get pregnant Breast-feeding How should I use this medication? This medication is injected into a vein. It is given by your care team in a hospital or clinic setting. Talk to your care team about the use of this medication in children. Special care may be needed. Overdosage: If you think you have taken too much of this medicine contact a poison control center or emergency room at once. NOTE: This medicine is only for you. Do not share this medicine with others. What if I miss a dose? Keep appointments for follow-up doses. It is important not to miss your dose. Call your care team if you are unable to keep an appointment. What may interact with this medication? Certain antibiotics given by injection Medications for inflammation or pain, such as ibuprofen, naproxen Some diuretics, such as bumetanide, furosemide Cyclosporine Parathyroid  hormone Tacrolimus Teriparatide Thalidomide This list may not describe all possible interactions. Give your health care provider a list of all the medicines, herbs, non-prescription drugs, or dietary supplements you use. Also tell them if you smoke, drink alcohol, or use illegal drugs. Some items may interact with your medicine. What should I watch for while using this medication? Visit your care team for regular checks on your progress. It may be some time before you see the benefit from this medication. Some people who take this medication have severe bone, joint, or muscle pain. This medication may also increase your risk for jaw problems or a broken thigh bone. Tell your care team right away if you have severe pain in your jaw, bones, joints, or muscles. Tell your care team if you have any pain that does not go away or that gets worse. Tell your dentist and dental surgeon that you are taking this medication. You should not have major dental surgery while on this medication. See your dentist to have a dental exam and fix any dental problems before starting this medication. Take good care of your teeth while on this medication. Make sure you see your dentist for regular follow-up appointments. You should make sure you get enough calcium and vitamin D while you are taking this medication. Discuss the foods you eat and the vitamins you take with your care team. You may need bloodwork while you are taking this medication. Talk to your care team if you wish to become pregnant or think you might be pregnant. This medication can cause serious birth defects. What side effects may I notice from receiving   this medication? Side effects that you should report to your care team as soon as possible: Allergic reactions--skin rash, itching, hives, swelling of the face, lips, tongue, or throat Kidney injury--decrease in the amount of urine, swelling of the ankles, hands, or feet Low calcium level--muscle pain or  cramps, confusion, tingling, or numbness in the hands or feet Osteonecrosis of the jaw--pain, swelling, or redness in the mouth, numbness of the jaw, poor healing after dental work, unusual discharge from the mouth, visible bones in the mouth Severe bone, joint, or muscle pain Side effects that usually do not require medical attention (report to your care team if they continue or are bothersome): Constipation Fatigue Fever Loss of appetite Nausea Pain, redness, or irritation at injection site Stomach pain This list may not describe all possible side effects. Call your doctor for medical advice about side effects. You may report side effects to FDA at 1-800-FDA-1088. Where should I keep my medication? This medication is given in a hospital or clinic. It will not be stored at home. NOTE: This sheet is a summary. It may not cover all possible information. If you have questions about this medicine, talk to your doctor, pharmacist, or health care provider.  2023 Elsevier/Gold Standard (2021-09-03 00:00:00) 

## 2022-01-11 ENCOUNTER — Telehealth: Payer: Self-pay

## 2022-01-11 NOTE — Telephone Encounter (Signed)
   Telephone encounter was:  Unsuccessful.  01/11/2022 Name: Tracy [name] MRN: [id number] DOB: [birth year]  Unsuccessful outbound call made today to assist with:  Food Insecurity and Financial Difficulties related to financial strain  A HIPAA compliant voice message was left requesting a return call.  Instructed patient to call back at earliest convenience.Follow up call about resources I had emailed .    [facility], Care Management  [phone] [address], [place], [place] [address] Phone: [phone] Email: [name]

## 2022-01-15 ENCOUNTER — Telehealth: Payer: Self-pay | Admitting: Family Medicine

## 2022-01-15 ENCOUNTER — Telehealth: Payer: Self-pay

## 2022-01-15 ENCOUNTER — Other Ambulatory Visit: Payer: Self-pay

## 2022-01-15 MED ORDER — GABAPENTIN 100 MG PO CAPS: 200.0000 mg | ORAL_CAPSULE | Freq: Three times a day (TID) | ORAL | 1 refills | Status: DC

## 2022-01-15 NOTE — Telephone Encounter (Signed)
Spoke to the pt and informed her we sent in her Rx but she needs a much sooner apt than May of 2024 for her diabetes . We are scheduling that now .

## 2022-01-15 NOTE — Telephone Encounter (Signed)
PT called to see if you could switch her Gabapentin 100 mg to mail order. PT only has four days worth of meds. Pt want to know if she can get her medication expedited.

## 2022-01-15 NOTE — Telephone Encounter (Signed)
Prescription was sent to [facility] as requested.  But given her diabetes, she needs an appt much sooner than next May.  She should schedule one for July to reassess.

## 2022-01-15 NOTE — Telephone Encounter (Signed)
   Telephone encounter was:  Unsuccessful.  01/15/2022 Name: Tracy [name] MRN: [id number] DOB: 03/04/1946  Unsuccessful outbound call made today to assist with:  Food Insecurity Follow up call A HIPAA compliant voice message was left requesting a return call.  Instructed patient to call back at   earliest convenience. [facility], Care Management  [phone] [address], [place], [place] [address] Phone: [phone] Email: [name]

## 2022-01-22 ENCOUNTER — Other Ambulatory Visit: Payer: Self-pay | Admitting: Hematology

## 2022-01-25 ENCOUNTER — Telehealth: Payer: Self-pay | Admitting: Family Medicine

## 2022-01-25 NOTE — Telephone Encounter (Signed)
error 

## 2022-01-30 ENCOUNTER — Other Ambulatory Visit: Payer: Self-pay

## 2022-01-30 DIAGNOSIS — C9000 Multiple myeloma not having achieved remission: Secondary | ICD-10-CM

## 2022-01-31 ENCOUNTER — Other Ambulatory Visit: Payer: Medicare Other

## 2022-01-31 ENCOUNTER — Ambulatory Visit: Payer: Medicare Other

## 2022-01-31 MED FILL — Dexamethasone Sodium Phosphate Inj 100 MG/10ML: INTRAMUSCULAR | Qty: 2 | Status: AC

## 2022-02-01 ENCOUNTER — Other Ambulatory Visit: Payer: Self-pay

## 2022-02-01 ENCOUNTER — Ambulatory Visit: Payer: Medicare Other | Admitting: Hematology

## 2022-02-01 ENCOUNTER — Inpatient Hospital Stay: Payer: Medicare Other

## 2022-02-01 ENCOUNTER — Other Ambulatory Visit: Payer: Medicare Other

## 2022-02-01 ENCOUNTER — Inpatient Hospital Stay: Payer: Medicare Other | Attending: Hematology

## 2022-02-01 ENCOUNTER — Ambulatory Visit: Payer: Medicare Other

## 2022-02-01 VITALS — BP 159/72 | HR 77 | Temp 98.4°F | Resp 17 | Wt 180.5 lb

## 2022-02-01 DIAGNOSIS — E538 Deficiency of other specified B group vitamins: Secondary | ICD-10-CM | POA: Diagnosis not present

## 2022-02-01 DIAGNOSIS — Z8673 Personal history of transient ischemic attack (TIA), and cerebral infarction without residual deficits: Secondary | ICD-10-CM | POA: Insufficient documentation

## 2022-02-01 DIAGNOSIS — Z923 Personal history of irradiation: Secondary | ICD-10-CM | POA: Diagnosis not present

## 2022-02-01 DIAGNOSIS — Z7189 Other specified counseling: Secondary | ICD-10-CM

## 2022-02-01 DIAGNOSIS — Z5112 Encounter for antineoplastic immunotherapy: Secondary | ICD-10-CM | POA: Insufficient documentation

## 2022-02-01 DIAGNOSIS — Z853 Personal history of malignant neoplasm of breast: Secondary | ICD-10-CM | POA: Insufficient documentation

## 2022-02-01 DIAGNOSIS — C7951 Secondary malignant neoplasm of bone: Secondary | ICD-10-CM

## 2022-02-01 DIAGNOSIS — Z95828 Presence of other vascular implants and grafts: Secondary | ICD-10-CM

## 2022-02-01 DIAGNOSIS — Z79899 Other long term (current) drug therapy: Secondary | ICD-10-CM | POA: Diagnosis not present

## 2022-02-01 DIAGNOSIS — N189 Chronic kidney disease, unspecified: Secondary | ICD-10-CM | POA: Diagnosis not present

## 2022-02-01 DIAGNOSIS — C9000 Multiple myeloma not having achieved remission: Secondary | ICD-10-CM | POA: Diagnosis not present

## 2022-02-01 DIAGNOSIS — Z87891 Personal history of nicotine dependence: Secondary | ICD-10-CM | POA: Insufficient documentation

## 2022-02-01 DIAGNOSIS — E1140 Type 2 diabetes mellitus with diabetic neuropathy, unspecified: Secondary | ICD-10-CM | POA: Diagnosis not present

## 2022-02-01 DIAGNOSIS — D509 Iron deficiency anemia, unspecified: Secondary | ICD-10-CM | POA: Insufficient documentation

## 2022-02-01 DIAGNOSIS — E1122 Type 2 diabetes mellitus with diabetic chronic kidney disease: Secondary | ICD-10-CM | POA: Diagnosis not present

## 2022-02-01 DIAGNOSIS — I252 Old myocardial infarction: Secondary | ICD-10-CM | POA: Diagnosis not present

## 2022-02-01 LAB — CMP (CANCER CENTER ONLY)
ALT: 10 U/L (ref 0–44)
AST: 13 U/L — ABNORMAL LOW (ref 15–41)
Albumin: 4.1 g/dL (ref 3.5–5.0)
Alkaline Phosphatase: 63 U/L (ref 38–126)
Anion gap: 9 (ref 5–15)
BUN: 13 mg/dL (ref 8–23)
CO2: 26 mmol/L (ref 22–32)
Calcium: 8.9 mg/dL (ref 8.9–10.3)
Chloride: 108 mmol/L (ref 98–111)
Creatinine: 1.25 mg/dL — ABNORMAL HIGH (ref 0.44–1.00)
GFR, Estimated: 44 mL/min — ABNORMAL LOW (ref >60)
Glucose, Bld: 134 mg/dL — ABNORMAL HIGH (ref 70–99)
Potassium: 3.9 mmol/L (ref 3.5–5.1)
Sodium: 143 mmol/L (ref 135–145)
Total Bilirubin: 0.3 mg/dL (ref 0.3–1.2)
Total Protein: 6.5 g/dL (ref 6.5–8.1)

## 2022-02-01 LAB — CBC WITH DIFFERENTIAL (CANCER CENTER ONLY)
Abs Immature Granulocytes: 0.01 10*3/uL (ref 0.00–0.07)
Basophils Absolute: 0 10*3/uL (ref 0.0–0.1)
Basophils Relative: 1 %
Eosinophils Absolute: 0.1 10*3/uL (ref 0.0–0.5)
Eosinophils Relative: 2 %
HCT: 31.5 % — ABNORMAL LOW (ref 36.0–46.0)
Hemoglobin: 10.6 g/dL — ABNORMAL LOW (ref 12.0–15.0)
Immature Granulocytes: 0 %
Lymphocytes Relative: 38 %
Lymphs Abs: 1.8 10*3/uL (ref 0.7–4.0)
MCH: 31.6 pg (ref 26.0–34.0)
MCHC: 33.7 g/dL (ref 30.0–36.0)
MCV: 94 fL (ref 80.0–100.0)
Monocytes Absolute: 0.3 10*3/uL (ref 0.1–1.0)
Monocytes Relative: 6 %
Neutro Abs: 2.4 10*3/uL (ref 1.7–7.7)
Neutrophils Relative %: 53 %
Platelet Count: 244 10*3/uL (ref 150–400)
RBC: 3.35 MIL/uL — ABNORMAL LOW (ref 3.87–5.11)
RDW: 13.6 % (ref 11.5–15.5)
WBC Count: 4.6 10*3/uL (ref 4.0–10.5)
nRBC: 0 % (ref 0.0–0.2)

## 2022-02-01 MED ORDER — SODIUM CHLORIDE 0.9 % IV SOLN
20.0000 mg | Freq: Once | INTRAVENOUS | Status: AC
  Administered 2022-02-01: 20 mg via INTRAVENOUS
  Filled 2022-02-01: qty 20

## 2022-02-01 MED ORDER — SODIUM CHLORIDE 0.9 % IV SOLN
16.0000 mg/kg | Freq: Once | INTRAVENOUS | Status: AC
  Administered 2022-02-01: 1400 mg via INTRAVENOUS
  Filled 2022-02-01: qty 60

## 2022-02-01 MED ORDER — SODIUM CHLORIDE 0.9 % IV SOLN: Freq: Once | INTRAVENOUS | Status: AC

## 2022-02-01 MED ORDER — ALTEPLASE 2 MG IJ SOLR
2.0000 mg | Freq: Once | INTRAMUSCULAR | Status: AC
  Administered 2022-02-01: 2 mg
  Filled 2022-02-01: qty 2

## 2022-02-01 MED ORDER — SODIUM CHLORIDE 0.9% FLUSH
10.0000 mL | Freq: Once | INTRAVENOUS | Status: AC
  Administered 2022-02-01: 10 mL

## 2022-02-01 MED ORDER — DIPHENHYDRAMINE HCL 25 MG PO CAPS
50.0000 mg | ORAL_CAPSULE | Freq: Once | ORAL | Status: AC
  Administered 2022-02-01: 50 mg via ORAL
  Filled 2022-02-01: qty 2

## 2022-02-01 MED ORDER — ACETAMINOPHEN 325 MG PO TABS
650.0000 mg | ORAL_TABLET | Freq: Once | ORAL | Status: AC
  Administered 2022-02-01: 650 mg via ORAL
  Filled 2022-02-01: qty 2

## 2022-02-01 MED ORDER — CYANOCOBALAMIN 1000 MCG/ML IJ SOLN
1000.0000 ug | Freq: Once | INTRAMUSCULAR | Status: AC
  Administered 2022-02-01: 1000 ug via INTRAMUSCULAR
  Filled 2022-02-01: qty 1

## 2022-02-01 MED ORDER — FAMOTIDINE IN NACL 20-0.9 MG/50ML-% IV SOLN
20.0000 mg | Freq: Once | INTRAVENOUS | Status: AC
  Administered 2022-02-01: 20 mg via INTRAVENOUS
  Filled 2022-02-01: qty 50

## 2022-02-01 MED ORDER — SODIUM CHLORIDE 0.9 % IV SOLN: Freq: Once | INTRAVENOUS | Status: DC

## 2022-02-01 MED ORDER — HEPARIN SOD (PORK) LOCK FLUSH 100 UNIT/ML IV SOLN
500.0000 [IU] | Freq: Once | INTRAVENOUS | Status: AC | PRN
  Administered 2022-02-01: 500 [IU]

## 2022-02-01 MED ORDER — SODIUM CHLORIDE 0.9% FLUSH
10.0000 mL | INTRAVENOUS | Status: DC | PRN
  Administered 2022-02-01: 10 mL

## 2022-02-01 NOTE — Patient Instructions (Signed)
[facility]  Discharge Instructions: Thank you for choosing [facility] to provide your oncology and hematology care.   If you have a lab appointment with the [facility], please go directly to the [facility] and check in at the registration area.   Wear comfortable clothing and clothing appropriate for easy access to any Portacath or PICC line.   We strive to give you quality time with your provider. You may need to reschedule your appointment if you arrive late (15 or more minutes).  Arriving late affects you and other patients whose appointments are after yours.  Also, if you miss three or more appointments without notifying the office, you may be dismissed from the clinic at the provider's discretion.      For prescription refill requests, have your pharmacy contact our office and allow 72 hours for refills to be completed.    Today you received the following chemotherapy and/or immunotherapy agents : Darzalex      To help prevent nausea and vomiting after your treatment, we encourage you to take your nausea medication as directed.  BELOW ARE SYMPTOMS THAT SHOULD BE REPORTED IMMEDIATELY: *FEVER GREATER THAN 100.4 F (38 C) OR HIGHER *CHILLS OR SWEATING *NAUSEA AND VOMITING THAT IS NOT CONTROLLED WITH YOUR NAUSEA MEDICATION *UNUSUAL SHORTNESS OF BREATH *UNUSUAL BRUISING OR BLEEDING *URINARY PROBLEMS (pain or burning when urinating, or frequent urination) *BOWEL PROBLEMS (unusual diarrhea, constipation, pain near the anus) TENDERNESS IN MOUTH AND THROAT WITH OR WITHOUT PRESENCE OF ULCERS (sore throat, sores in mouth, or a toothache) UNUSUAL RASH, SWELLING OR PAIN  UNUSUAL VAGINAL DISCHARGE OR ITCHING   Items with * indicate a potential emergency and should be followed up as soon as possible or go to the Emergency Department if any problems should occur.  Please show the CHEMOTHERAPY ALERT CARD or IMMUNOTHERAPY ALERT CARD at check-in to  the Emergency Department and triage nurse.  Should you have questions after your visit or need to cancel or reschedule your appointment, please contact [facility]  Dept: [phone]  and follow the prompts.  Office hours are 8:00 a.m. to 4:30 p.m. Monday - Friday. Please note that voicemails left after 4:00 p.m. may not be returned until the following business day.  We are closed weekends and major holidays. You have access to a nurse at all times for urgent questions. Please call the main number to the clinic Dept: [phone] and follow the prompts.   For any non-urgent questions, you may also contact your provider using MyChart. We now offer e-Visits for anyone 60 and older to request care online for non-urgent symptoms. For details visit [email or web address].   Also download the MyChart app! Go to the app store, search "MyChart", open the app, select [facility], and log in with your MyChart username and password.  Masks are optional in the cancer centers. If you would like for your care team to wear a mask while they are taking care of you, please let them know. For doctor visits, patients may have with them one support person who is at least 78 years old. At this time, visitors are not allowed in the infusion area.

## 2022-02-04 ENCOUNTER — Ambulatory Visit: Payer: Medicare Other | Admitting: Family Medicine

## 2022-02-05 LAB — MULTIPLE MYELOMA PANEL, SERUM
Albumin SerPl Elph-Mcnc: 3.5 g/dL (ref 2.9–4.4)
Albumin/Glob SerPl: 1.5 (ref 0.7–1.7)
Alpha 1: 0.2 g/dL (ref 0.0–0.4)
Alpha2 Glob SerPl Elph-Mcnc: 1 g/dL (ref 0.4–1.0)
B-Globulin SerPl Elph-Mcnc: 0.9 g/dL (ref 0.7–1.3)
Gamma Glob SerPl Elph-Mcnc: 0.4 g/dL (ref 0.4–1.8)
Globulin, Total: 2.5 g/dL (ref 2.2–3.9)
IgA: 70 mg/dL (ref 64–422)
IgG (Immunoglobin G), Serum: 403 mg/dL — ABNORMAL LOW (ref 586–1602)
IgM (Immunoglobulin M), Srm: 46 mg/dL (ref 26–217)
Total Protein ELP: 6 g/dL (ref 6.0–8.5)

## 2022-02-06 ENCOUNTER — Telehealth: Payer: Self-pay | Admitting: Pharmacist

## 2022-02-06 NOTE — Progress Notes (Signed)
Chronic Care Management Pharmacy Assistant   Name: Tracy [name]  MRN: [id number] DOB: 1945-09-05   Reason for Encounter: Disease State - General Adherence Call     Recent office visits:  11/07/21 [name], NP - Family Medicine - Strain of trapezius muscle - cyclobenzaprine (FLEXERIL) 5 MG tablet prescribed. Follow up in 1 week if no improvement.   09/24/21 [name], MD - Family Medicine - Physical - Labs were ordered. Follow up in 6 months.   Recent consult visits:  12/07/21 [name] MD - Oncology - Multiple Myeloma - Oncology treatment received. schedule next 4 doses of daratumumab every 4 weeks with port flush and labs. Continue B12 subcutaneous injection every 4 weeks Venofer 364m every 4 weeks x 2 doses. Continue Aredia every 8 weeks. MD visit in 8 weeks.  10/12/21 GSullivan LoneMD - Oncology - Multiple Myeloma - Oncology treatment received. Follow up as scheduled.    Hospital visits:  None in previous 6 months  Medications: Outpatient Encounter Medications as of 02/06/2022  Medication Sig Note   acyclovir (ZOVIRAX) 400 MG tablet Take 400 mg by mouth 2 (two) times daily.    atorvastatin (LIPITOR) 10 MG tablet TAKE 1 TABLET BY MOUTH  DAILY    azelastine (ASTELIN) 0.1 % nasal spray Place 1 spray into both nostrils 2 (two) times daily. Use in each nostril as directed    cyclobenzaprine (FLEXERIL) 5 MG tablet Take 1 tablet (5 mg total) by mouth 2 (two) times daily as needed for muscle spasms.    gabapentin (NEURONTIN) 100 MG capsule Take 2 capsules (200 mg total) by mouth 3 (three) times daily.    glucose blood (ACCU-CHEK GUIDE) test strip Use as instructed to check sugars 1-2 times daily.    hydrocortisone 2.5 % cream Apply topically 2 (two) times daily.    loratadine (CLARITIN) 10 MG tablet Take 10 mg by mouth daily.    meclizine (ANTIVERT) 25 MG tablet Take 1 tablet (25 mg total) by mouth 3 (three) times daily as needed for dizziness. 08/22/2021: PRN    oxymetazoline (AFRIN) 0.05 % nasal spray Place 1 spray into both nostrils 2 (two) times daily as needed for congestion. Or bloody nose.    pantoprazole (PROTONIX) 40 MG tablet TAKE 1 TABLET(40 MG) BY MOUTH TWICE DAILY    traZODone (DESYREL) 100 MG tablet TAKE 1 TABLET BY MOUTH AT  BEDTIME    triamcinolone cream (KENALOG) 0.1 % Apply 1 application topically 2 (two) times daily.    Vitamin D, Ergocalciferol, (DRISDOL) 1.25 MG (50000 UNIT) CAPS capsule TAKE 1 CAPSULE BY MOUTH 1  TIME A WEEK    Facility-Administered Encounter Medications as of 02/06/2022  Medication   sodium chloride flush (NS) 0.9 % injection 10 mL    Contacted [facility] for General Review Call   Chart Review:  Have there been any documented new, changed, or discontinued medications since last visit?  (If yes, include name, dose, frequency, date) Has there been any documented recent hospitalizations or ED visits since last visit with Clinical Pharmacist?  Brief Summary (including medication and/or Diagnosis changes):   Adherence Review:  Does the Clinical Pharmacist Assistant have access to adherence rates?  Adherence rates for STAR metric medications (List medication(s)/day supply/ last 2 fill dates). Adherence rates for medications indicated for disease state being reviewed (List medication(s)/day supply/ last 2 fill dates). Does the patient have >5 day gap between last estimated fill dates for any of the above medications or  other medication gaps?  Reason for medication gaps.   Disease State Questions:  Able to connect with Patient?  Did patient have any problems with their health recently?  Note problems and Concerns: Have you had any admissions or emergency room visits or worsening of your condition(s) since last visit?  Details of ED visit, hospital visit and/or worsening condition(s): Have you had any visits with new specialists or providers since your last visit?  Explain: Have you had any new health  care problem(s) since your last visit?  New problem(s) reported: Have you run out of any of your medications since you last spoke with clinical pharmacist?  What caused you to run out of your medications? Are there any medications you are not taking as prescribed?  What kept you from taking your medications as prescribed? Are you having any issues or side effects with your medications?  Note of issues or side effects: Do you have any other health concerns or questions you want to discuss with your Clinical Pharmacist before your next visit?  Note additional concerns and questions from Patient. Are there any health concerns that you feel we can do a better job addressing?  Note Patient's response. Are you having any problems with any of the following since the last visit: (select all that apply)    Details: 12. Any falls since last visit?   Details: 13. Any increased or uncontrolled pain since last visit?   Details: 14. Next visit Type: office       Visit with: Infusion        Date: 03/01/22         Time: 10:30am  15. Additional Details?     Care Gaps  AWV: done 09/24/21 Colonoscopy: done 05/16/20 DM Eye Exam: N/A DM Foot Exam: N/A Microalbumin: N/A HbgAIC: N/A DEXA: done 01/30/21 Mammogram: done 09/24/21    Star Rating Drugs: atorvastatin (LIPITOR) 10 MG tablet - last filled 01/01/22 100 days    Future Appointments  Date Time Provider Department Center  03/01/2022 10:00 AM [facility] FLUSH [facility] None  03/01/2022 10:30 AM Kale, Gautam Kishore, MD [facility] None  03/01/2022 11:30 AM [facility] INFUSION [facility] None  03/07/2022 10:30 AM [facility] FLUSH [facility] None  03/07/2022 11:30 AM [facility] INFUSION [facility] None  03/29/2022 10:30 AM [facility] FLUSH [facility] None  03/29/2022 11:30 AM [facility] INFUSION [facility] None  12/26/2022  2:30 PM [facility]   Multiple attempts were made to contact patient. Attempts were unsuccessful. /  ls,CMA   Liza Showfety, CCMA Clinical Pharmacist Assistant  [phone]   

## 2022-02-11 ENCOUNTER — Other Ambulatory Visit: Payer: Self-pay | Admitting: Hematology

## 2022-02-18 ENCOUNTER — Other Ambulatory Visit: Payer: Self-pay

## 2022-02-18 DIAGNOSIS — C7951 Secondary malignant neoplasm of bone: Secondary | ICD-10-CM

## 2022-02-18 MED ORDER — ACYCLOVIR 400 MG PO TABS: 400.0000 mg | ORAL_TABLET | Freq: Two times a day (BID) | ORAL | 1 refills | Status: DC

## 2022-02-19 ENCOUNTER — Other Ambulatory Visit: Payer: Self-pay | Admitting: Hematology

## 2022-02-19 DIAGNOSIS — C9000 Multiple myeloma not having achieved remission: Secondary | ICD-10-CM

## 2022-02-24 ENCOUNTER — Other Ambulatory Visit: Payer: Self-pay | Admitting: Family Medicine

## 2022-02-27 ENCOUNTER — Other Ambulatory Visit: Payer: Self-pay

## 2022-02-27 DIAGNOSIS — C9000 Multiple myeloma not having achieved remission: Secondary | ICD-10-CM

## 2022-02-28 MED FILL — Dexamethasone Sodium Phosphate Inj 100 MG/10ML: INTRAMUSCULAR | Qty: 2 | Status: AC

## 2022-03-01 ENCOUNTER — Inpatient Hospital Stay: Payer: Medicare Other | Attending: Hematology

## 2022-03-01 ENCOUNTER — Inpatient Hospital Stay (HOSPITAL_BASED_OUTPATIENT_CLINIC_OR_DEPARTMENT_OTHER): Payer: Medicare Other | Admitting: Hematology

## 2022-03-01 ENCOUNTER — Other Ambulatory Visit: Payer: Self-pay

## 2022-03-01 ENCOUNTER — Telehealth: Payer: Self-pay | Admitting: Pharmacist

## 2022-03-01 ENCOUNTER — Inpatient Hospital Stay: Payer: Medicare Other

## 2022-03-01 VITALS — BP 160/80 | HR 80 | Temp 97.6°F | Ht 59.0 in | Wt 184.0 lb

## 2022-03-01 VITALS — BP 145/59 | HR 75

## 2022-03-01 DIAGNOSIS — Z853 Personal history of malignant neoplasm of breast: Secondary | ICD-10-CM | POA: Insufficient documentation

## 2022-03-01 DIAGNOSIS — Z8673 Personal history of transient ischemic attack (TIA), and cerebral infarction without residual deficits: Secondary | ICD-10-CM | POA: Insufficient documentation

## 2022-03-01 DIAGNOSIS — E538 Deficiency of other specified B group vitamins: Secondary | ICD-10-CM | POA: Insufficient documentation

## 2022-03-01 DIAGNOSIS — I252 Old myocardial infarction: Secondary | ICD-10-CM | POA: Insufficient documentation

## 2022-03-01 DIAGNOSIS — Z923 Personal history of irradiation: Secondary | ICD-10-CM | POA: Diagnosis not present

## 2022-03-01 DIAGNOSIS — Z7189 Other specified counseling: Secondary | ICD-10-CM

## 2022-03-01 DIAGNOSIS — N189 Chronic kidney disease, unspecified: Secondary | ICD-10-CM | POA: Diagnosis not present

## 2022-03-01 DIAGNOSIS — Z79899 Other long term (current) drug therapy: Secondary | ICD-10-CM | POA: Insufficient documentation

## 2022-03-01 DIAGNOSIS — Z95828 Presence of other vascular implants and grafts: Secondary | ICD-10-CM

## 2022-03-01 DIAGNOSIS — Z5112 Encounter for antineoplastic immunotherapy: Secondary | ICD-10-CM

## 2022-03-01 DIAGNOSIS — E1122 Type 2 diabetes mellitus with diabetic chronic kidney disease: Secondary | ICD-10-CM | POA: Diagnosis not present

## 2022-03-01 DIAGNOSIS — Z87891 Personal history of nicotine dependence: Secondary | ICD-10-CM | POA: Insufficient documentation

## 2022-03-01 DIAGNOSIS — C9000 Multiple myeloma not having achieved remission: Secondary | ICD-10-CM

## 2022-03-01 DIAGNOSIS — E1140 Type 2 diabetes mellitus with diabetic neuropathy, unspecified: Secondary | ICD-10-CM | POA: Diagnosis not present

## 2022-03-01 DIAGNOSIS — C7951 Secondary malignant neoplasm of bone: Secondary | ICD-10-CM

## 2022-03-01 DIAGNOSIS — D509 Iron deficiency anemia, unspecified: Secondary | ICD-10-CM | POA: Diagnosis not present

## 2022-03-01 LAB — CMP (CANCER CENTER ONLY)
ALT: 12 U/L (ref 0–44)
AST: 15 U/L (ref 15–41)
Albumin: 4.3 g/dL (ref 3.5–5.0)
Alkaline Phosphatase: 70 U/L (ref 38–126)
Anion gap: 10 (ref 5–15)
BUN: 13 mg/dL (ref 8–23)
CO2: 25 mmol/L (ref 22–32)
Calcium: 9 mg/dL (ref 8.9–10.3)
Chloride: 106 mmol/L (ref 98–111)
Creatinine: 1.24 mg/dL — ABNORMAL HIGH (ref 0.44–1.00)
GFR, Estimated: 45 mL/min — ABNORMAL LOW (ref >60)
Glucose, Bld: 104 mg/dL — ABNORMAL HIGH (ref 70–99)
Potassium: 4 mmol/L (ref 3.5–5.1)
Sodium: 141 mmol/L (ref 135–145)
Total Bilirubin: 0.3 mg/dL (ref 0.3–1.2)
Total Protein: 6.8 g/dL (ref 6.5–8.1)

## 2022-03-01 LAB — CBC WITH DIFFERENTIAL (CANCER CENTER ONLY)
Abs Immature Granulocytes: 0.01 10*3/uL (ref 0.00–0.07)
Basophils Absolute: 0 10*3/uL (ref 0.0–0.1)
Basophils Relative: 1 %
Eosinophils Absolute: 0.1 10*3/uL (ref 0.0–0.5)
Eosinophils Relative: 2 %
HCT: 31.6 % — ABNORMAL LOW (ref 36.0–46.0)
Hemoglobin: 10.9 g/dL — ABNORMAL LOW (ref 12.0–15.0)
Immature Granulocytes: 0 %
Lymphocytes Relative: 42 %
Lymphs Abs: 1.9 10*3/uL (ref 0.7–4.0)
MCH: 31.9 pg (ref 26.0–34.0)
MCHC: 34.5 g/dL (ref 30.0–36.0)
MCV: 92.4 fL (ref 80.0–100.0)
Monocytes Absolute: 0.3 10*3/uL (ref 0.1–1.0)
Monocytes Relative: 7 %
Neutro Abs: 2.2 10*3/uL (ref 1.7–7.7)
Neutrophils Relative %: 48 %
Platelet Count: 277 10*3/uL (ref 150–400)
RBC: 3.42 MIL/uL — ABNORMAL LOW (ref 3.87–5.11)
RDW: 13 % (ref 11.5–15.5)
WBC Count: 4.6 10*3/uL (ref 4.0–10.5)
nRBC: 0 % (ref 0.0–0.2)

## 2022-03-01 MED ORDER — ACETAMINOPHEN 325 MG PO TABS
650.0000 mg | ORAL_TABLET | Freq: Once | ORAL | Status: AC
  Administered 2022-03-01: 650 mg via ORAL
  Filled 2022-03-01: qty 2

## 2022-03-01 MED ORDER — ONDANSETRON HCL 4 MG/2ML IJ SOLN
4.0000 mg | Freq: Once | INTRAMUSCULAR | Status: AC
  Administered 2022-03-01: 4 mg via INTRAVENOUS
  Filled 2022-03-01: qty 2

## 2022-03-01 MED ORDER — SODIUM CHLORIDE 0.9 % IV SOLN
16.0000 mg/kg | Freq: Once | INTRAVENOUS | Status: AC
  Administered 2022-03-01: 1400 mg via INTRAVENOUS
  Filled 2022-03-01: qty 60

## 2022-03-01 MED ORDER — SODIUM CHLORIDE 0.9% FLUSH
10.0000 mL | Freq: Once | INTRAVENOUS | Status: AC
  Administered 2022-03-01: 10 mL

## 2022-03-01 MED ORDER — SODIUM CHLORIDE 0.9 % IV SOLN: Freq: Once | INTRAVENOUS | Status: DC

## 2022-03-01 MED ORDER — DIPHENHYDRAMINE HCL 25 MG PO CAPS
50.0000 mg | ORAL_CAPSULE | Freq: Once | ORAL | Status: AC
  Administered 2022-03-01: 50 mg via ORAL
  Filled 2022-03-01: qty 2

## 2022-03-01 MED ORDER — FAMOTIDINE IN NACL 20-0.9 MG/50ML-% IV SOLN
20.0000 mg | Freq: Once | INTRAVENOUS | Status: AC
  Administered 2022-03-01: 20 mg via INTRAVENOUS
  Filled 2022-03-01: qty 50

## 2022-03-01 MED ORDER — SODIUM CHLORIDE 0.9% FLUSH: 10.0000 mL | INTRAVENOUS | Status: DC | PRN

## 2022-03-01 MED ORDER — SODIUM CHLORIDE 0.9 % IV SOLN: Freq: Once | INTRAVENOUS | Status: AC

## 2022-03-01 MED ORDER — SODIUM CHLORIDE 0.9 % IV SOLN
20.0000 mg | Freq: Once | INTRAVENOUS | Status: AC
  Administered 2022-03-01: 20 mg via INTRAVENOUS
  Filled 2022-03-01: qty 20

## 2022-03-01 MED ORDER — CYANOCOBALAMIN 1000 MCG/ML IJ SOLN
1000.0000 ug | Freq: Once | INTRAMUSCULAR | Status: AC
  Administered 2022-03-01: 1000 ug via INTRAMUSCULAR
  Filled 2022-03-01: qty 1

## 2022-03-01 NOTE — Patient Instructions (Signed)
[facility]  Discharge Instructions: Thank you for choosing [facility] to provide your oncology and hematology care.   If you have a lab appointment with the [facility], please go directly to the [facility] and check in at the registration area.   Wear comfortable clothing and clothing appropriate for easy access to any Portacath or PICC line.   We strive to give you quality time with your provider. You may need to reschedule your appointment if you arrive late (15 or more minutes).  Arriving late affects you and other patients whose appointments are after yours.  Also, if you miss three or more appointments without notifying the office, you may be dismissed from the clinic at the provider's discretion.      For prescription refill requests, have your pharmacy contact our office and allow 72 hours for refills to be completed.    Today you received the following chemotherapy and/or immunotherapy agents : Darzalex      To help prevent nausea and vomiting after your treatment, we encourage you to take your nausea medication as directed.  BELOW ARE SYMPTOMS THAT SHOULD BE REPORTED IMMEDIATELY: *FEVER GREATER THAN 100.4 F (38 C) OR HIGHER *CHILLS OR SWEATING *NAUSEA AND VOMITING THAT IS NOT CONTROLLED WITH YOUR NAUSEA MEDICATION *UNUSUAL SHORTNESS OF BREATH *UNUSUAL BRUISING OR BLEEDING *URINARY PROBLEMS (pain or burning when urinating, or frequent urination) *BOWEL PROBLEMS (unusual diarrhea, constipation, pain near the anus) TENDERNESS IN MOUTH AND THROAT WITH OR WITHOUT PRESENCE OF ULCERS (sore throat, sores in mouth, or a toothache) UNUSUAL RASH, SWELLING OR PAIN  UNUSUAL VAGINAL DISCHARGE OR ITCHING   Items with * indicate a potential emergency and should be followed up as soon as possible or go to the Emergency Department if any problems should occur.  Please show the CHEMOTHERAPY ALERT CARD or IMMUNOTHERAPY ALERT CARD at check-in to  the Emergency Department and triage nurse.  Should you have questions after your visit or need to cancel or reschedule your appointment, please contact [facility]  Dept: [phone]  and follow the prompts.  Office hours are 8:00 a.m. to 4:30 p.m. Monday - Friday. Please note that voicemails left after 4:00 p.m. may not be returned until the following business day.  We are closed weekends and major holidays. You have access to a nurse at all times for urgent questions. Please call the main number to the clinic Dept: [phone] and follow the prompts.   For any non-urgent questions, you may also contact your provider using MyChart. We now offer e-Visits for anyone 9 and older to request care online for non-urgent symptoms. For details visit [email or web address].   Also download the MyChart app! Go to the app store, search "MyChart", open the app, select [facility], and log in with your MyChart username and password.  Masks are optional in the cancer centers. If you would like for your care team to wear a mask while they are taking care of you, please let them know. For doctor visits, patients may have with them one support Amal Renbarger who is at least 78 years old. At this time, visitors are not allowed in the infusion area.

## 2022-03-01 NOTE — Progress Notes (Signed)
Chronic Care Management Pharmacy Assistant   Name: Tracy [name]  MRN: [id number] DOB: 1945-11-11   Reason for Encounter: Disease State - General Adherence Call      Recent office visits:  11/07/21 Tracy Coss, NP - Family Medicine - Strain of trapezius muscle - cyclobenzaprine (FLEXERIL) 5 MG tablet prescribed. Follow up in 1 week if no improvement.    09/24/21 Tracy Asa, MD - Family Medicine - Physical - Labs were ordered. Follow up in 6 months.    Recent consult visits:  03/01/22 Tracy Lone MD - Oncology - Multiple Myeloma - Oncology treatment received.  12/07/21 Tracy Lone MD - Oncology - Multiple Myeloma - Oncology treatment received. schedule next 4 doses of daratumumab every 4 weeks with port flush and labs. Continue B12 subcutaneous injection every 4 weeks Venofer 350m every 4 weeks x 2 doses. Continue Aredia every 8 weeks. MD visit in 8 weeks.   10/12/21 Tracy LoneMD - Oncology - Multiple Myeloma - Oncology treatment received. Follow up as scheduled.      Hospital visits:  None in previous 6 months  Medications: Outpatient Encounter Medications as of 03/01/2022  Medication Sig Note   acyclovir (ZOVIRAX) 400 MG tablet Take 1 tablet (400 mg total) by mouth 2 (two) times daily.    atorvastatin (LIPITOR) 10 MG tablet TAKE 1 TABLET BY MOUTH  DAILY    azelastine (ASTELIN) 0.1 % nasal spray Place 1 spray into both nostrils 2 (two) times daily. Use in each nostril as directed    cyclobenzaprine (FLEXERIL) 5 MG tablet Take 1 tablet (5 mg total) by mouth 2 (two) times daily as needed for muscle spasms.    gabapentin (NEURONTIN) 100 MG capsule Take 2 capsules (200 mg total) by mouth 3 (three) times daily.    glucose blood (ACCU-CHEK GUIDE) test strip Use as instructed to check sugars 1-2 times daily.    hydrocortisone 2.5 % cream Apply topically 2 (two) times daily.    loratadine (CLARITIN) 10 MG tablet Take 10 mg by mouth daily.    meclizine (ANTIVERT) 25 MG tablet  Take 1 tablet (25 mg total) by mouth 3 (three) times daily as needed for dizziness. 08/22/2021: PRN   oxymetazoline (AFRIN) 0.05 % nasal spray Place 1 spray into both nostrils 2 (two) times daily as needed for congestion. Or bloody nose.    pantoprazole (PROTONIX) 40 MG tablet TAKE 1 TABLET BY MOUTH TWICE  DAILY    traZODone (DESYREL) 100 MG tablet TAKE 1 TABLET BY MOUTH AT  BEDTIME    triamcinolone cream (KENALOG) 0.1 % Apply 1 application topically 2 (two) times daily.    Vitamin D, Ergocalciferol, (DRISDOL) 1.25 MG (50000 UNIT) CAPS capsule TAKE 1 CAPSULE BY MOUTH ONCE  WEEKLY    Facility-Administered Encounter Medications as of 03/01/2022  Medication   sodium chloride flush (NS) 0.9 % injection 10 mL    Contacted Tracy [name] for General Review Call     Chart Review:   Have there been any documented new, changed, or discontinued medications since last visit?  (If yes, include name, dose, frequency, date) Has there been any documented recent hospitalizations or ED visits since last visit with Clinical Pharmacist?  Brief Summary (including medication and/or Diagnosis changes):     Adherence Review:   Does the Clinical Pharmacist Assistant have access to adherence rates?  Adherence rates for STAR metric medications (List medication(s)/day supply/ last 2 fill dates). Adherence rates for medications indicated for disease state being  reviewed (List medication(s)/day supply/ last 2 fill dates). Does the patient have >5 day gap between last estimated fill dates for any of the above medications or other medication gaps?  Reason for medication gaps.     Disease State Questions:   Able to connect with Patient?  Did patient have any problems with their health recently?  Note problems and Concerns: Have you had any admissions or emergency room visits or worsening of your condition(s) since last visit?  Details of ED visit, hospital visit and/or worsening condition(s): Have you had any  visits with new specialists or providers since your last visit?  Explain: Have you had any new health care problem(s) since your last visit?  New problem(s) reported: Have you run out of any of your medications since you last spoke with clinical pharmacist?  What caused you to run out of your medications? Are there any medications you are not taking as prescribed?  What kept you from taking your medications as prescribed? Are you having any issues or side effects with your medications?  Note of issues or side effects: Do you have any other health concerns or questions you want to discuss with your Clinical Pharmacist before your next visit?  Note additional concerns and questions from Patient. Are there any health concerns that you feel we can do a better job addressing?  Note Patient's response. Are you having any problems with any of the following since the last visit: (select all that apply)               Details: 12. Any falls since last visit?              Details: 13. Any increased or uncontrolled pain since last visit?              Details: 14. Next visit Type: office       Visit with: Infusion        Date: 03/07/22         Time: 10:30am   15. Additional Details?     Care Gaps   AWV: done 09/24/21 Colonoscopy: done 05/16/20 DM Eye Exam: N/A DM Foot Exam: N/A Microalbumin: N/A HbgAIC: N/A DEXA: done 01/30/21 Mammogram: done 09/24/21      Star Rating Drugs: atorvastatin (LIPITOR) 10 MG tablet - last filled 01/01/22 100 days     Future Appointments  Date Time Provider [place]  03/07/2022 10:30 AM [facility] FLUSH [facility] None  03/07/2022 11:30 AM [facility] INFUSION [facility] None  03/29/2022 10:30 AM [facility] FLUSH [facility] None  03/29/2022 11:30 AM [facility] INFUSION [facility] None  12/26/2022  2:30 PM [facility] CO[facility]    Multiple attempts were made to contact patient. Attempts were unsuccessful. / ls,CMA   [name],  [facility] Pharmacist Assistant  [phone]

## 2022-03-05 ENCOUNTER — Telehealth: Payer: Self-pay | Admitting: Hematology

## 2022-03-05 ENCOUNTER — Ambulatory Visit: Payer: Medicare Other | Admitting: Hematology

## 2022-03-05 LAB — MULTIPLE MYELOMA PANEL, SERUM
Albumin SerPl Elph-Mcnc: 3.7 g/dL (ref 2.9–4.4)
Albumin/Glob SerPl: 1.5 (ref 0.7–1.7)
Alpha 1: 0.2 g/dL (ref 0.0–0.4)
Alpha2 Glob SerPl Elph-Mcnc: 1.2 g/dL — ABNORMAL HIGH (ref 0.4–1.0)
B-Globulin SerPl Elph-Mcnc: 0.8 g/dL (ref 0.7–1.3)
Gamma Glob SerPl Elph-Mcnc: 0.4 g/dL (ref 0.4–1.8)
Globulin, Total: 2.5 g/dL (ref 2.2–3.9)
IgA: 79 mg/dL (ref 64–422)
IgG (Immunoglobin G), Serum: 418 mg/dL — ABNORMAL LOW (ref 586–1602)
IgM (Immunoglobulin M), Srm: 46 mg/dL (ref 26–217)
Total Protein ELP: 6.2 g/dL (ref 6.0–8.5)

## 2022-03-05 NOTE — Telephone Encounter (Signed)
Scheduled per 08/04 los, patient has been called and voicemail was left.

## 2022-03-06 ENCOUNTER — Other Ambulatory Visit: Payer: Self-pay | Admitting: *Deleted

## 2022-03-06 DIAGNOSIS — C9000 Multiple myeloma not having achieved remission: Secondary | ICD-10-CM

## 2022-03-07 ENCOUNTER — Other Ambulatory Visit: Payer: Self-pay | Admitting: Family Medicine

## 2022-03-07 ENCOUNTER — Other Ambulatory Visit: Payer: Self-pay

## 2022-03-07 ENCOUNTER — Inpatient Hospital Stay: Payer: Medicare Other

## 2022-03-07 VITALS — BP 133/56 | HR 82 | Temp 98.2°F | Resp 17 | Ht 59.0 in | Wt 180.8 lb

## 2022-03-07 DIAGNOSIS — E538 Deficiency of other specified B group vitamins: Secondary | ICD-10-CM | POA: Diagnosis not present

## 2022-03-07 DIAGNOSIS — Z79899 Other long term (current) drug therapy: Secondary | ICD-10-CM | POA: Diagnosis not present

## 2022-03-07 DIAGNOSIS — C9000 Multiple myeloma not having achieved remission: Secondary | ICD-10-CM | POA: Diagnosis not present

## 2022-03-07 DIAGNOSIS — I252 Old myocardial infarction: Secondary | ICD-10-CM | POA: Diagnosis not present

## 2022-03-07 DIAGNOSIS — Z87891 Personal history of nicotine dependence: Secondary | ICD-10-CM | POA: Diagnosis not present

## 2022-03-07 DIAGNOSIS — C7951 Secondary malignant neoplasm of bone: Secondary | ICD-10-CM

## 2022-03-07 DIAGNOSIS — E785 Hyperlipidemia, unspecified: Secondary | ICD-10-CM

## 2022-03-07 DIAGNOSIS — Z923 Personal history of irradiation: Secondary | ICD-10-CM | POA: Diagnosis not present

## 2022-03-07 DIAGNOSIS — Z95828 Presence of other vascular implants and grafts: Secondary | ICD-10-CM

## 2022-03-07 DIAGNOSIS — Z5112 Encounter for antineoplastic immunotherapy: Secondary | ICD-10-CM | POA: Diagnosis not present

## 2022-03-07 DIAGNOSIS — Z853 Personal history of malignant neoplasm of breast: Secondary | ICD-10-CM | POA: Diagnosis not present

## 2022-03-07 DIAGNOSIS — E1140 Type 2 diabetes mellitus with diabetic neuropathy, unspecified: Secondary | ICD-10-CM | POA: Diagnosis not present

## 2022-03-07 DIAGNOSIS — D509 Iron deficiency anemia, unspecified: Secondary | ICD-10-CM | POA: Diagnosis not present

## 2022-03-07 DIAGNOSIS — E1122 Type 2 diabetes mellitus with diabetic chronic kidney disease: Secondary | ICD-10-CM | POA: Diagnosis not present

## 2022-03-07 DIAGNOSIS — N189 Chronic kidney disease, unspecified: Secondary | ICD-10-CM | POA: Diagnosis not present

## 2022-03-07 DIAGNOSIS — Z8673 Personal history of transient ischemic attack (TIA), and cerebral infarction without residual deficits: Secondary | ICD-10-CM | POA: Diagnosis not present

## 2022-03-07 LAB — CMP (CANCER CENTER ONLY)
ALT: 10 U/L (ref 0–44)
AST: 13 U/L — ABNORMAL LOW (ref 15–41)
Albumin: 4.3 g/dL (ref 3.5–5.0)
Alkaline Phosphatase: 61 U/L (ref 38–126)
Anion gap: 8 (ref 5–15)
BUN: 15 mg/dL (ref 8–23)
CO2: 26 mmol/L (ref 22–32)
Calcium: 8.9 mg/dL (ref 8.9–10.3)
Chloride: 108 mmol/L (ref 98–111)
Creatinine: 1.27 mg/dL — ABNORMAL HIGH (ref 0.44–1.00)
GFR, Estimated: 44 mL/min — ABNORMAL LOW (ref >60)
Glucose, Bld: 137 mg/dL — ABNORMAL HIGH (ref 70–99)
Potassium: 3.6 mmol/L (ref 3.5–5.1)
Sodium: 142 mmol/L (ref 135–145)
Total Bilirubin: 0.4 mg/dL (ref 0.3–1.2)
Total Protein: 6.7 g/dL (ref 6.5–8.1)

## 2022-03-07 LAB — CBC WITH DIFFERENTIAL (CANCER CENTER ONLY)
Abs Immature Granulocytes: 0.02 10*3/uL (ref 0.00–0.07)
Basophils Absolute: 0 10*3/uL (ref 0.0–0.1)
Basophils Relative: 0 %
Eosinophils Absolute: 0.1 10*3/uL (ref 0.0–0.5)
Eosinophils Relative: 2 %
HCT: 30.5 % — ABNORMAL LOW (ref 36.0–46.0)
Hemoglobin: 10.5 g/dL — ABNORMAL LOW (ref 12.0–15.0)
Immature Granulocytes: 0 %
Lymphocytes Relative: 36 %
Lymphs Abs: 1.8 10*3/uL (ref 0.7–4.0)
MCH: 31.7 pg (ref 26.0–34.0)
MCHC: 34.4 g/dL (ref 30.0–36.0)
MCV: 92.1 fL (ref 80.0–100.0)
Monocytes Absolute: 0.3 10*3/uL (ref 0.1–1.0)
Monocytes Relative: 7 %
Neutro Abs: 2.8 10*3/uL (ref 1.7–7.7)
Neutrophils Relative %: 55 %
Platelet Count: 270 10*3/uL (ref 150–400)
RBC: 3.31 MIL/uL — ABNORMAL LOW (ref 3.87–5.11)
RDW: 13.3 % (ref 11.5–15.5)
WBC Count: 5 10*3/uL (ref 4.0–10.5)
nRBC: 0 % (ref 0.0–0.2)

## 2022-03-07 MED ORDER — HEPARIN SOD (PORK) LOCK FLUSH 100 UNIT/ML IV SOLN: 250.0000 [IU] | Freq: Once | INTRAVENOUS | Status: DC | PRN

## 2022-03-07 MED ORDER — SODIUM CHLORIDE 0.9% FLUSH: 3.0000 mL | Freq: Once | INTRAVENOUS | Status: DC | PRN

## 2022-03-07 MED ORDER — SODIUM CHLORIDE 0.9 % IV SOLN
60.0000 mg | Freq: Once | INTRAVENOUS | Status: AC
  Administered 2022-03-07: 60 mg via INTRAVENOUS
  Filled 2022-03-07: qty 10

## 2022-03-07 MED ORDER — SODIUM CHLORIDE 0.9 % IV SOLN: Freq: Once | INTRAVENOUS | Status: AC

## 2022-03-07 MED ORDER — SODIUM CHLORIDE 0.9% FLUSH
10.0000 mL | Freq: Once | INTRAVENOUS | Status: AC
  Administered 2022-03-07: 10 mL

## 2022-03-07 MED ORDER — SODIUM CHLORIDE 0.9 % IV SOLN
60.0000 mg | Freq: Once | INTRAVENOUS | Status: DC
  Filled 2022-03-07: qty 20

## 2022-03-07 NOTE — Patient Instructions (Signed)
[facility]  Discharge Instructions: Thank you for choosing [facility] to provide your oncology and hematology care.   If you have a lab appointment with the [facility], please go directly to the [facility] and check in at the registration area.   Wear comfortable clothing and clothing appropriate for easy access to any Portacath or PICC line.   We strive to give you quality time with your provider. You may need to reschedule your appointment if you arrive late (15 or more minutes).  Arriving late affects you and other patients whose appointments are after yours.  Also, if you miss three or more appointments without notifying the office, you may be dismissed from the clinic at the provider's discretion.      For prescription refill requests, have your pharmacy contact our office and allow 72 hours for refills to be completed.    Today you received the following chemotherapy and/or immunotherapy agent: Aredia      To help prevent nausea and vomiting after your treatment, we encourage you to take your nausea medication as directed.  BELOW ARE SYMPTOMS THAT SHOULD BE REPORTED IMMEDIATELY: *FEVER GREATER THAN 100.4 F (38 C) OR HIGHER *CHILLS OR SWEATING *NAUSEA AND VOMITING THAT IS NOT CONTROLLED WITH YOUR NAUSEA MEDICATION *UNUSUAL SHORTNESS OF BREATH *UNUSUAL BRUISING OR BLEEDING *URINARY PROBLEMS (pain or burning when urinating, or frequent urination) *BOWEL PROBLEMS (unusual diarrhea, constipation, pain near the anus) TENDERNESS IN MOUTH AND THROAT WITH OR WITHOUT PRESENCE OF ULCERS (sore throat, sores in mouth, or a toothache) UNUSUAL RASH, SWELLING OR PAIN  UNUSUAL VAGINAL DISCHARGE OR ITCHING   Items with * indicate a potential emergency and should be followed up as soon as possible or go to the Emergency Department if any problems should occur.  Please show the CHEMOTHERAPY ALERT CARD or IMMUNOTHERAPY ALERT CARD at check-in to the  Emergency Department and triage nurse.  Should you have questions after your visit or need to cancel or reschedule your appointment, please contact [facility]  Dept: [phone]  and follow the prompts.  Office hours are 8:00 a.m. to 4:30 p.m. Monday - Friday. Please note that voicemails left after 4:00 p.m. may not be returned until the following business day.  We are closed weekends and major holidays. You have access to a nurse at all times for urgent questions. Please call the main number to the clinic Dept: [phone] and follow the prompts.   For any non-urgent questions, you may also contact your provider using MyChart. We now offer e-Visits for anyone 3 and older to request care online for non-urgent symptoms. For details visit [email or web address].   Also download the MyChart app! Go to the app store, search "MyChart", open the app, select [facility], and log in with your MyChart username and password.  Masks are optional in the cancer centers. If you would like for your care team to wear a mask while they are taking care of you, please let them know. You may have one support person who is at least 78 years old accompany you for your appointments.

## 2022-03-08 ENCOUNTER — Encounter: Payer: Self-pay | Admitting: Hematology

## 2022-03-08 NOTE — Progress Notes (Signed)
[facility] Follow up:   DOS .03/01/2022  [name], MD [address] [place] [address]   CC: Follow-up for continued evaluation and management of multiple myeloma  SUMMARY OF ONCOLOGIC HISTORY: Oncology History  History of right breast cancer  04/16/2000 Surgery   Left breast: Triple negative  invasive ductal cancer treated with lumpectomy, adjuvant chemotherapy, radiation , in [place], unknown stage   06/07/2015 Mammogram   Right breast mass 6x 6 x 5 mm, right axillary lymph node with slight cortex thickening measured 5 mm    06/13/2015 Initial Diagnosis   Right breast needle biopsy: Invasive ductal carcinoma, grade 1, right axillary lymph node biopsy negative , ER 95%, PR 5%, Ki-67 10%, HER-2 negative   06/13/2015 Clinical Stage   Stage IA: T1b N0   07/07/2015 Surgery   Right lumpectomy: Invasive ductal carcinoma grade 1, 1 cm span, with low-grade DCIS, DCIS focally 0.1 cm to inferior margin, 0/3 lymph nodes negative, ER 95%, PR 5%, HER-2 negative ratio 1.1, Ki-67 10%   07/07/2015 Pathologic Stage   Stage IA: T1c N0   07/13/2015 Procedure   Breast High/Moderate Risk Panel reveals no clinically significant variant at ATM, BRCA1, BRCA2, CDH1, CHEK2, PALB2, PTEN, and TP53.     08/23/2015 - 09/21/2015 Radiation Therapy   Adjuvant Radiation: Right breast/ 42.5Gy at 2.5 Gy per fraction x 17 fractions.   Right breast boost/ 7.5 Gy at 2.5 Gy per fraction x 3 fractions    Anti-estrogen oral therapy   Patient refused antiestrogen therapy   10/20/2015 Survivorship   Survivorship care plan completed and mailed to patient in lieu of in person visit at her request   Iron deficiency anemia  Multiple myeloma ([place])  01/25/2020 Initial Diagnosis   Multiple myeloma ([facility] [place])   02/11/2020 - 05/19/2020 Adjuvant Chemotherapy   Daratumumab Weekly    02/29/2020 - 03/13/2020 Radiation Therapy   The targets were treated to a total dose of 20 Gy in 10  fractions of 2 Gy each to the left hip and L5 using one plan.   06/02/2020 - 10/05/2020 Adjuvant Chemotherapy   Daratumumab and Carfilzomib   10/19/2020 -  Adjuvant Chemotherapy   Maintenance Daratumumab--initially every 2 weeks, then every 4 weeks.    Malignant neoplasm metastatic to bone ([place])  02/08/2021 Initial Diagnosis   Bone metastases (HCC)     CURRENT THERAPY: Daratumumab/B12 injection Pamidronate (on hold for dental procedure)  INTERVAL HISTORY:  Tracy [name] is here for continued evaluation and management of her multiple myeloma.  She notes no acute new symptoms since her last clinic visit.  No abdominal pain or signs of GI bleeding.  Hemoglobin stable at 10.5.  No new toxicities from her daratumumab.  She notes her other medical issues have been relatively stable as well.  No new focal bone pains. Labs done today were discussed with her in details.   Patient Active Problem List   Diagnosis Date Noted   Malignant neoplasm metastatic to bone ([place]) 02/08/2021   Angiodysplasia of intestine 08/23/2020   Port-A-Cath in place 08/04/2020   Counseling regarding advance care planning and goals of care 02/07/2020   Multiple myeloma ([place]) 01/25/2020   Dizziness 10/12/2019   Post-nasal drainage 10/12/2019   Allergic rhinitis 08/19/2018   Type 2 diabetes mellitus with diabetic neuropathy, unspecified ([place]) 11/05/2017   Encounter for long-term use of muscle relaxants 09/24/2017   CAD in native artery 09/24/2017   OSA (obstructive sleep apnea)  09/24/2017   [place] 09/24/2017   Asthenia 06/30/2017   Morbid obesity ([place]) 06/02/2017   Depression 06/02/2017   Iron deficiency anemia 09/23/2016   Anemia of chronic disease 09/28/2015   Genetic testing 08/21/2015   History of left breast cancer 07/13/2015   History of right breast cancer 06/20/2015   Hearing loss due to cerumen impaction 12/29/2014   Allergy to adhesive tape 05/19/2014   Hyperlipidemia 12/06/2013   GERD  (gastroesophageal reflux disease) 12/06/2013   Cervical disc disease 11/11/2013   Osteopenia 05/25/2013   Allergic asthma 12/24/2012   Insomnia 04/02/2012   Physical exam, annual 04/02/2012   HTN (hypertension) 02/03/2012   Vertigo, benign positional 02/03/2012   Left groin pain 02/03/2012   Hip pain 10/10/2011   TIA (transient ischemic attack) 07/24/2011   Allergic reaction 07/05/2011   Osteoarthrosis involving lower leg 10/19/2010   Disorder of bone and cartilage 05/01/2010   Generalized anxiety disorder 05/01/2010   Vitamin D deficiency 02/20/2010   Unspecified chronic bronchitis ([place]) 08/24/2009   Other lymphedema 10/29/2007    is allergic to bacitracin-neomycin-polymyxin  [neomycin-bacitracin zn-polymyx], nsaids, tape, ambien [zolpidem tartrate], amoxicillin, clavulanic acid, contrast media [iodinated contrast media], latex, prednisone, and tessalon [benzonatate].  MEDICAL HISTORY: Past Medical History:  Diagnosis Date   Angiodysplasia of intestine 08/23/2020   Anxiety    Arthritis    Breast cancer ([facility]) 06/13/15   Cancer ([place]) 2000   breast cancer   Chronic bronchitis ([place])    Chronic bronchitis ([place])    Hyperlipidemia    Hypertension    Myocardial infarction [facility]) 2001   Personal history of radiation therapy    Restless leg    Stroke ([place]) 2004   TIA, no deficits    SURGICAL HISTORY: Past Surgical History:  Procedure Laterality Date   ABDOMINAL HYSTERECTOMY  1985   BREAST LUMPECTOMY Left 2000   radiation and chemo   BREAST LUMPECTOMY Right 2016   radiation   BREAST SURGERY  2001   lt breast lumpectomy   COLONOSCOPY WITH ESOPHAGOGASTRODUODENOSCOPY (EGD)  04/2020   ENTEROSCOPY N/A 03/15/2021   Procedure: ENTEROSCOPY;  Surgeon: Milus Banister, MD;  Location: [facility];  Service: Endoscopy;  Laterality: N/A;   GIVENS CAPSULE STUDY  07/2020   HOT HEMOSTASIS N/A 03/15/2021   Procedure: HOT HEMOSTASIS (ARGON PLASMA COAGULATION/BICAP);  Surgeon: Milus Banister, MD;  Location: [facility];  Service: Endoscopy;  Laterality: N/A;   IR IMAGING GUIDED PORT INSERTION  04/25/2020   RADIOACTIVE SEED GUIDED PARTIAL MASTECTOMY WITH AXILLARY SENTINEL LYMPH NODE BIOPSY Right 07/07/2015   Procedure: RIGHT RADIOACTIVE SEED GUIDED PARTIAL MASTECTOMY WITH AXILLARY SENTINEL LYMPH NODE BIOPSY;  Surgeon: Autumn Messing III, MD;  Location: [facility];  Service: General;  Laterality: Right;   SMALL INTESTINE SURGERY     TUBAL LIGATION     UPPER GASTROINTESTINAL ENDOSCOPY      SOCIAL HISTORY: Social History   Socioeconomic History   Marital status: Divorced    Spouse name: Not on file   Number of children: 7   Years of education: Not on file   Highest education level: Not on file  Occupational History   Occupation: retired  Tobacco Use   Smoking status: Former    Packs/day: 1.00    Years: 20.00    Total pack years: 20.00    Types: Cigarettes    Quit date: 07/30/2011    Years since quitting: 10.6   Smokeless tobacco: Never   Tobacco comments:    Quit >  4 years ago; 1 ppd for about 5/20 years (remaining was less)  Vaping Use   Vaping Use: Former  Substance and Sexual Activity   Alcohol use: No    Alcohol/week: 0.0 standard drinks of alcohol   Drug use: No   Sexual activity: Not Currently  Other Topics Concern   Not on file  Social History Narrative   Lives alone.  Retired.  Education:  11th grade GED.  Children:  7 (one here).    Social Determinants of Health   Financial Resource Strain: High Risk (12/21/2021)   Overall Financial Resource Strain (CARDIA)    Difficulty of Paying Living Expenses: Hard  Food Insecurity: Food Insecurity Present (12/21/2021)   Hunger Vital Sign    Worried About Running Out of Food in the Last Year: Often true    Ran Out of Food in the Last Year: Often true  Transportation Needs: No Transportation Needs (12/20/2021)   PRAPARE - Hydrologist (Medical): No    Lack of  Transportation (Non-Medical): No  Physical Activity: Inactive (12/20/2021)   Exercise Vital Sign    Days of Exercise per Week: 0 days    Minutes of Exercise per Session: 0 min  Stress: No Stress Concern Present (12/20/2021)   [place]    Feeling of Stress : Not at all  Social Connections: Moderately Integrated (12/20/2021)   Social Connection and Isolation Panel [NHANES]    Frequency of Communication with Friends and Family: More than three times a week    Frequency of Social Gatherings with Friends and Family: More than three times a week    Attends Religious Services: More than 4 times per year    Active Member of [employer] or Organizations: Yes    Attends Music therapist: More than 4 times per year    Marital Status: Divorced  Intimate Partner Violence: Not At Risk (12/20/2021)   Humiliation, Afraid, Rape, and Kick questionnaire    Fear of Current or Ex-Partner: No    Emotionally Abused: No    Physically Abused: No    Sexually Abused: No    FAMILY HISTORY: Family History  Problem Relation Age of Onset   Emphysema Mother 74       smoker   Diabetes Father    Lung cancer Sister        dx. <50; former smoker   Diabetes Brother    Diabetes Brother    Brain cancer Brother 76       unknown tumor type   Diabetes Paternal Aunt    Stroke Maternal Grandmother    Diabetes Paternal Grandmother    Cancer Daughter 58       neck cancer   Other Daughter        hysterectomy for unspecified reason   Colon cancer Daughter    Breast cancer Cousin    Cancer Cousin        unspecified type   Breast cancer Other        triple negative breast cancer in her 23s   Colon polyps Neg Hx    Esophageal cancer Neg Hx    Gallbladder disease Neg Hx    ROS 10 Point review of Systems was done is negative except as noted above.  PHYSICAL EXAMINATION  ECOG PERFORMANCE STATUS: 1 - Symptomatic but completely  ambulatory  Vitals:   03/01/22 1102  BP: (!) 160/80  Pulse: 80  Temp: 97.6 F (  36.4 C)  SpO2: 100%  . NAD GENERAL:alert, in no acute distress and comfortable SKIN: no acute rashes, no significant lesions EYES: conjunctiva are pink and non-injected, sclera anicteric OROPHARYNX: MMM, no exudates, no oropharyngeal erythema or ulceration NECK: supple, no JVD LYMPH:  no palpable lymphadenopathy in the cervical, axillary or inguinal regions LUNGS: clear to auscultation b/l with normal respiratory effort HEART: regular rate & rhythm ABDOMEN:  normoactive bowel sounds , non tender, not distended. Extremity: no pedal edema PSYCH: alert & oriented x 3 with fluent speech NEURO: no focal motor/sensory deficits   LABORATORY DATA: .    Latest Ref Rng & Units 03/07/2022   10:41 AM 03/01/2022   10:28 AM 02/01/2022   10:46 AM  CBC  WBC 4.0 - 10.5 K/uL 5.0  4.6  4.6   Hemoglobin 12.0 - 15.0 g/dL 10.5  10.9  10.6   Hematocrit 36.0 - 46.0 % 30.5  31.6  31.5   Platelets 150 - 400 K/uL 270  277  244    .    Latest Ref Rng & Units 03/07/2022   10:41 AM 03/01/2022   10:28 AM 02/01/2022   10:46 AM  CMP  Glucose 70 - 99 mg/dL 137  104  134   BUN 8 - 23 mg/dL _0 Creatinine 0.44 - 1.00 mg/dL 1.27  1.24  1.25   Sodium 135 - 145 mmol/L 142  141  143   Potassium 3.5 - 5.1 mmol/L 3.6  4.0  3.9   Chloride 98 - 111 mmol/L 108  106  108   CO2 22 - 32 mmol/L _1 Calcium 8.9 - 10.3 mg/dL 8.9  9.0  8.9   Total Protein 6.5 - 8.1 g/dL 6.7  6.8  6.5   Total Bilirubin 0.3 - 1.2 mg/dL 0.4  0.3  0.3   Alkaline Phos 38 - 126 U/L 61  70  63   AST 15 - 41 U/L _2 ALT 0 - 44 U/L _3 ASSESSMENT and THERAPY PLAN:   76 yo here for follow-up of her for follow-up of her multiple myeloma   1)  IgG Kappa Multiple myeloma with bone lesions, anemia, renal insuff. M spike @ 3.7g/dl on diagnosis. 1p deletion, polymorphic variant, 13q deletion Multiple myeloma panel from 06/28/2021  with no M spike.  IFE positive for IgG kappa monoclonal protein possibly from her daratumumab. Currently on monthly daratumumab Pamidronate is on hold due to dental extraction in October 2022 2) h/o DM2 3) Diabetic Neuropathy 4) CKD - likely from DM2, but could have an element of myeloma kidney. 5) h/o TIA and AMI 6) Iron deficiency 7) B12 deficiency   PLAN: -Patient's labs done today were discussed with her in details  CBC shows stable hemoglobin Myeloma panel from 03/01/2022 shows no observable M spike suggesting the patient is in continued remission. -Patient has no new toxicities from her monthly daratumumab at this time -We shall continue monthly daratumumab every 4 weeks with labs -Continue B12 1000 mcg subcu every 4 weeks for B12 deficiency -Will continue to monitor iron labs and replace IV iron as needed Continue maintenance Aredia every 8 weeks.  Follow-up note: please schedule next 4 doses of daratumumab every 4 weeks with port flush and labs. Continue B12 subcutaneous injection every 4 weeks Venofer 379m every 4 weeks x 2 doses Continue Aredia every 8 weeks  MD visit in 8 weeks   The total time spent in the appointment was 30 minutes*.  All of the patient's questions were answered with apparent satisfaction. The patient knows to call the clinic with any problems, questions or concerns.   [name] MD MS AAHIVMS [facility] Hematology/Oncology Physician [facility]  .*Total Encounter Time as defined by the Centers for Medicare and Medicaid Services includes, in addition to the face-to-face time of a patient visit (documented in the note above) non-face-to-face time: obtaining and reviewing outside history, ordering and reviewing medications, tests or procedures, care coordination (communications with other health care professionals or caregivers) and documentation in the medical record.

## 2022-03-21 ENCOUNTER — Other Ambulatory Visit: Payer: Self-pay | Admitting: Family Medicine

## 2022-03-28 ENCOUNTER — Other Ambulatory Visit: Payer: Self-pay | Admitting: *Deleted

## 2022-03-28 DIAGNOSIS — C9000 Multiple myeloma not having achieved remission: Secondary | ICD-10-CM

## 2022-03-28 MED FILL — Dexamethasone Sodium Phosphate Inj 100 MG/10ML: INTRAMUSCULAR | Qty: 2 | Status: AC

## 2022-03-29 ENCOUNTER — Other Ambulatory Visit: Payer: Self-pay | Admitting: Hematology

## 2022-03-29 ENCOUNTER — Other Ambulatory Visit: Payer: Self-pay

## 2022-03-29 ENCOUNTER — Inpatient Hospital Stay: Payer: Medicare Other | Attending: Hematology

## 2022-03-29 ENCOUNTER — Inpatient Hospital Stay: Payer: Medicare Other

## 2022-03-29 VITALS — BP 153/68 | HR 80 | Temp 98.7°F | Resp 17 | Wt 181.0 lb

## 2022-03-29 DIAGNOSIS — Z923 Personal history of irradiation: Secondary | ICD-10-CM | POA: Diagnosis not present

## 2022-03-29 DIAGNOSIS — Z853 Personal history of malignant neoplasm of breast: Secondary | ICD-10-CM | POA: Diagnosis not present

## 2022-03-29 DIAGNOSIS — E538 Deficiency of other specified B group vitamins: Secondary | ICD-10-CM | POA: Insufficient documentation

## 2022-03-29 DIAGNOSIS — I252 Old myocardial infarction: Secondary | ICD-10-CM | POA: Insufficient documentation

## 2022-03-29 DIAGNOSIS — Z79899 Other long term (current) drug therapy: Secondary | ICD-10-CM | POA: Insufficient documentation

## 2022-03-29 DIAGNOSIS — E1140 Type 2 diabetes mellitus with diabetic neuropathy, unspecified: Secondary | ICD-10-CM | POA: Diagnosis not present

## 2022-03-29 DIAGNOSIS — D509 Iron deficiency anemia, unspecified: Secondary | ICD-10-CM | POA: Insufficient documentation

## 2022-03-29 DIAGNOSIS — E1122 Type 2 diabetes mellitus with diabetic chronic kidney disease: Secondary | ICD-10-CM | POA: Insufficient documentation

## 2022-03-29 DIAGNOSIS — C9000 Multiple myeloma not having achieved remission: Secondary | ICD-10-CM | POA: Diagnosis not present

## 2022-03-29 DIAGNOSIS — Z8673 Personal history of transient ischemic attack (TIA), and cerebral infarction without residual deficits: Secondary | ICD-10-CM | POA: Diagnosis not present

## 2022-03-29 DIAGNOSIS — Z87891 Personal history of nicotine dependence: Secondary | ICD-10-CM | POA: Diagnosis not present

## 2022-03-29 DIAGNOSIS — Z95828 Presence of other vascular implants and grafts: Secondary | ICD-10-CM

## 2022-03-29 DIAGNOSIS — C7951 Secondary malignant neoplasm of bone: Secondary | ICD-10-CM

## 2022-03-29 DIAGNOSIS — Z5112 Encounter for antineoplastic immunotherapy: Secondary | ICD-10-CM | POA: Insufficient documentation

## 2022-03-29 DIAGNOSIS — Z7189 Other specified counseling: Secondary | ICD-10-CM

## 2022-03-29 DIAGNOSIS — N189 Chronic kidney disease, unspecified: Secondary | ICD-10-CM | POA: Diagnosis not present

## 2022-03-29 LAB — CMP (CANCER CENTER ONLY)
ALT: 9 U/L (ref 0–44)
AST: 13 U/L — ABNORMAL LOW (ref 15–41)
Albumin: 4.1 g/dL (ref 3.5–5.0)
Alkaline Phosphatase: 69 U/L (ref 38–126)
Anion gap: 7 (ref 5–15)
BUN: 11 mg/dL (ref 8–23)
CO2: 26 mmol/L (ref 22–32)
Calcium: 9.3 mg/dL (ref 8.9–10.3)
Chloride: 109 mmol/L (ref 98–111)
Creatinine: 1.16 mg/dL — ABNORMAL HIGH (ref 0.44–1.00)
GFR, Estimated: 48 mL/min — ABNORMAL LOW (ref >60)
Glucose, Bld: 101 mg/dL — ABNORMAL HIGH (ref 70–99)
Potassium: 3.8 mmol/L (ref 3.5–5.1)
Sodium: 142 mmol/L (ref 135–145)
Total Bilirubin: 0.3 mg/dL (ref 0.3–1.2)
Total Protein: 6.4 g/dL — ABNORMAL LOW (ref 6.5–8.1)

## 2022-03-29 LAB — CBC WITH DIFFERENTIAL (CANCER CENTER ONLY)
Abs Immature Granulocytes: 0 10*3/uL (ref 0.00–0.07)
Basophils Absolute: 0 10*3/uL (ref 0.0–0.1)
Basophils Relative: 1 %
Eosinophils Absolute: 0.1 10*3/uL (ref 0.0–0.5)
Eosinophils Relative: 2 %
HCT: 30 % — ABNORMAL LOW (ref 36.0–46.0)
Hemoglobin: 10.4 g/dL — ABNORMAL LOW (ref 12.0–15.0)
Immature Granulocytes: 0 %
Lymphocytes Relative: 41 %
Lymphs Abs: 1.8 10*3/uL (ref 0.7–4.0)
MCH: 32.1 pg (ref 26.0–34.0)
MCHC: 34.7 g/dL (ref 30.0–36.0)
MCV: 92.6 fL (ref 80.0–100.0)
Monocytes Absolute: 0.3 10*3/uL (ref 0.1–1.0)
Monocytes Relative: 7 %
Neutro Abs: 2.2 10*3/uL (ref 1.7–7.7)
Neutrophils Relative %: 49 %
Platelet Count: 271 10*3/uL (ref 150–400)
RBC: 3.24 MIL/uL — ABNORMAL LOW (ref 3.87–5.11)
RDW: 12.8 % (ref 11.5–15.5)
WBC Count: 4.4 10*3/uL (ref 4.0–10.5)
nRBC: 0 % (ref 0.0–0.2)

## 2022-03-29 MED ORDER — SODIUM CHLORIDE 0.9 % IV SOLN: Freq: Once | INTRAVENOUS | Status: AC

## 2022-03-29 MED ORDER — SODIUM CHLORIDE 0.9% FLUSH
10.0000 mL | Freq: Once | INTRAVENOUS | Status: AC
  Administered 2022-03-29: 10 mL

## 2022-03-29 MED ORDER — SODIUM CHLORIDE 0.9% FLUSH
10.0000 mL | INTRAVENOUS | Status: DC | PRN
  Administered 2022-03-29: 10 mL

## 2022-03-29 MED ORDER — DIPHENHYDRAMINE HCL 25 MG PO CAPS
50.0000 mg | ORAL_CAPSULE | Freq: Once | ORAL | Status: AC
  Administered 2022-03-29: 50 mg via ORAL
  Filled 2022-03-29: qty 2

## 2022-03-29 MED ORDER — HEPARIN SOD (PORK) LOCK FLUSH 100 UNIT/ML IV SOLN
500.0000 [IU] | Freq: Once | INTRAVENOUS | Status: AC | PRN
  Administered 2022-03-29: 500 [IU]

## 2022-03-29 MED ORDER — ACETAMINOPHEN 325 MG PO TABS
650.0000 mg | ORAL_TABLET | Freq: Once | ORAL | Status: AC
  Administered 2022-03-29: 650 mg via ORAL
  Filled 2022-03-29: qty 2

## 2022-03-29 MED ORDER — FAMOTIDINE IN NACL 20-0.9 MG/50ML-% IV SOLN
20.0000 mg | Freq: Once | INTRAVENOUS | Status: AC
  Administered 2022-03-29: 20 mg via INTRAVENOUS
  Filled 2022-03-29: qty 50

## 2022-03-29 MED ORDER — SODIUM CHLORIDE 0.9 % IV SOLN
16.0000 mg/kg | Freq: Once | INTRAVENOUS | Status: AC
  Administered 2022-03-29: 1400 mg via INTRAVENOUS
  Filled 2022-03-29: qty 60

## 2022-03-29 MED ORDER — SODIUM CHLORIDE 0.9 % IV SOLN
20.0000 mg | Freq: Once | INTRAVENOUS | Status: AC
  Administered 2022-03-29: 20 mg via INTRAVENOUS
  Filled 2022-03-29: qty 20

## 2022-03-29 NOTE — Patient Instructions (Signed)
[facility] MEDICAL ONCOLOGY  Discharge Instructions: Thank you for choosing [facility] to provide your oncology and hematology care.   If you have a lab appointment with the Cancer Center, please go directly to the Cancer Center and check in at the registration area.   Wear comfortable clothing and clothing appropriate for easy access to any Portacath or PICC line.   We strive to give you quality time with your provider. You may need to reschedule your appointment if you arrive late (15 or more minutes).  Arriving late affects you and other patients whose appointments are after yours.  Also, if you miss three or more appointments without notifying the office, you may be dismissed from the clinic at the provider's discretion.      For prescription refill requests, have your pharmacy contact our office and allow 72 hours for refills to be completed.    Today you received the following chemotherapy and/or immunotherapy agents: Darzalex      To help prevent nausea and vomiting after your treatment, we encourage you to take your nausea medication as directed.  BELOW ARE SYMPTOMS THAT SHOULD BE REPORTED IMMEDIATELY: *FEVER GREATER THAN 100.4 F (38 C) OR HIGHER *CHILLS OR SWEATING *NAUSEA AND VOMITING THAT IS NOT CONTROLLED WITH YOUR NAUSEA MEDICATION *UNUSUAL SHORTNESS OF BREATH *UNUSUAL BRUISING OR BLEEDING *URINARY PROBLEMS (pain or burning when urinating, or frequent urination) *BOWEL PROBLEMS (unusual diarrhea, constipation, pain near the anus) TENDERNESS IN MOUTH AND THROAT WITH OR WITHOUT PRESENCE OF ULCERS (sore throat, sores in mouth, or a toothache) UNUSUAL RASH, SWELLING OR PAIN  UNUSUAL VAGINAL DISCHARGE OR ITCHING   Items with * indicate a potential emergency and should be followed up as soon as possible or go to the Emergency Department if any problems should occur.  Please show the CHEMOTHERAPY ALERT CARD or IMMUNOTHERAPY ALERT CARD at check-in to  the Emergency Department and triage nurse.  Should you have questions after your visit or need to cancel or reschedule your appointment, please contact [facility]  Dept: [phone]  and follow the prompts.  Office hours are 8:00 a.m. to 4:30 p.m. Monday - Friday. Please note that voicemails left after 4:00 p.m. may not be returned until the following business day.  We are closed weekends and major holidays. You have access to a nurse at all times for urgent questions. Please call the main number to the clinic Dept: [phone] and follow the prompts.   For any non-urgent questions, you may also contact your provider using MyChart. We now offer e-Visits for anyone 18 and older to request care online for non-urgent symptoms. For details visit [email or web address].   Also download the MyChart app! Go to the app store, search "MyChart", open the app, select [facility], and log in with your MyChart username and password.  Masks are optional in the cancer centers. If you would like for your care team to wear a mask while they are taking care of you, please let them know. You may have one support person who is at least 78 years old accompany you for your appointments. 

## 2022-04-03 ENCOUNTER — Telehealth: Payer: Self-pay | Admitting: Pharmacist

## 2022-04-03 NOTE — Progress Notes (Signed)
Chronic Care Management Pharmacy Assistant   Name: Tracy [name]  MRN: [id number] DOB: 1946-05-24   Reason for Encounter: Disease State - General Adherence Call    Recent office visits:  None noted.   Recent consult visits:  None noted.   Hospital visits:  None in previous 6 months  Medications: Outpatient Encounter Medications as of 04/03/2022  Medication Sig Note   acyclovir (ZOVIRAX) 400 MG tablet Take 1 tablet (400 mg total) by mouth 2 (two) times daily.    atorvastatin (LIPITOR) 10 MG tablet TAKE 1 TABLET BY MOUTH DAILY    azelastine (ASTELIN) 0.1 % nasal spray Place 1 spray into both nostrils 2 (two) times daily. Use in each nostril as directed    cyclobenzaprine (FLEXERIL) 5 MG tablet Take 1 tablet (5 mg total) by mouth 2 (two) times daily as needed for muscle spasms.    gabapentin (NEURONTIN) 100 MG capsule TAKE 2 CAPSULES BY MOUTH 3 TIMES DAILY    glucose blood (ACCU-CHEK GUIDE) test strip Use as instructed to check sugars 1-2 times daily.    hydrocortisone 2.5 % cream Apply topically 2 (two) times daily.    loratadine (CLARITIN) 10 MG tablet Take 10 mg by mouth daily.    meclizine (ANTIVERT) 25 MG tablet Take 1 tablet (25 mg total) by mouth 3 (three) times daily as needed for dizziness. 08/22/2021: PRN   oxymetazoline (AFRIN) 0.05 % nasal spray Place 1 spray into both nostrils 2 (two) times daily as needed for congestion. Or bloody nose.    pantoprazole (PROTONIX) 40 MG tablet TAKE 1 TABLET BY MOUTH TWICE  DAILY    traZODone (DESYREL) 100 MG tablet TAKE 1 TABLET BY MOUTH AT  BEDTIME    triamcinolone cream (KENALOG) 0.1 % Apply 1 application topically 2 (two) times daily.    Vitamin D, Ergocalciferol, (DRISDOL) 1.25 MG (50000 UNIT) CAPS capsule TAKE 1 CAPSULE BY MOUTH ONCE  WEEKLY    Facility-Administered Encounter Medications as of 04/03/2022  Medication   sodium chloride flush (NS) 0.9 % injection 10 mL    Contacted Tracy [name] for General Review Call      Chart Review:   Have there been any documented new, changed, or discontinued medications since last visit?  (If yes, include name, dose, frequency, date) No Has there been any documented recent hospitalizations or ED visits since last visit with Clinical Pharmacist?  Brief Summary (including medication and/or Diagnosis changes): No     Adherence Review:   Does the Clinical Pharmacist Assistant have access to adherence rates?   Yes Adherence rates for STAR metric medications (List medication(s)/day supply/ last 2 fill dates). Adherence rates for medications indicated for disease state being reviewed (List medication(s)/day supply/ last 2 fill dates). Does the patient have >5 day gap between last estimated fill dates for any of the above medications or other medication gaps?  No Reason for medication gaps. N/A     Disease State Questions:   Able to connect with Patient? Yes Did patient have any problems with their health recently?  Note problems and Concerns: No Have you had any admissions or emergency room visits or worsening of your condition(s) since last visit?  Details of ED visit, hospital visit and/or worsening condition(s): No Have you had any visits with new specialists or providers since your last visit?  Explain: No Have you had any new health care problem(s) since your last visit? No New problem(s) reported: Have you run out of any of your  medications since you last spoke with clinical pharmacist?  No What caused you to run out of your medications? Are there any medications you are not taking as prescribed?  What kept you from taking your medications as prescribed? No Are you having any issues or side effects with your medications?  No Note of issues or side effects: Do you have any other health concerns or questions you want to discuss with your Clinical Pharmacist before your next visit?  No Note additional concerns and questions from Patient. Are there any health  concerns that you feel we can do a better job addressing? No Note Patient's response. Are you having any problems with any of the following since the last visit: (select all that apply)  None               Details: 12. Any falls since last visit? No             Details: 13. Any increased or uncontrolled pain since last visit? No             Details: 14. Next visit Type: office       Visit with: Infusion        Date: 9/29//23         Time: 10am   15. Additional Details?  Patient reported she is currently doing well      Care Gaps   AWV: done 09/24/21 Colonoscopy: done 05/16/20 DM Eye Exam: N/A DM Foot Exam: N/A Microalbumin: N/A HbgAIC: N/A DEXA: done 01/30/21 Mammogram: done 09/24/21      Star Rating Drugs: atorvastatin (LIPITOR) 10 MG tablet - last filled 01/01/22 100 days    Future Appointments  Date Time Provider [place]  04/26/2022 10:00 AM [facility] None  04/26/2022 10:30 AM [name], MD [facility] None  04/26/2022 11:30 AM [facility] INFUSION [facility] None  05/03/2022 11:15 AM [facility] FLUSH [facility] None  05/03/2022 12:00 PM [facility] INFUSION [facility] None  05/24/2022  9:00 AM [facility] FLUSH [facility] None  05/24/2022  9:30 AM [name], MD [facility] None  05/24/2022 10:45 AM [facility] INFUSION [facility] None  06/05/2022  1:00 PM [facility] PHARMACIST [facility]  12/26/2022  2:30 PM [facility] HEALTH COACH [facility] [place], [place] Pharmacist Assistant  [phone]

## 2022-04-09 ENCOUNTER — Other Ambulatory Visit: Payer: Self-pay | Admitting: Family Medicine

## 2022-04-09 NOTE — Telephone Encounter (Signed)
Patient is requesting a refill of the following medications: Requested Prescriptions   Pending Prescriptions Disp Refills   tiZANidine (ZANAFLEX) 4 MG tablet [Pharmacy Med Name: tiZANidine HCl 4 MG Oral Tablet] 90 tablet 1    Sig: TAKE 1 TABLET BY MOUTH AT  BEDTIME    Date of patient request: 04/09/22 Last office visit: 11/07/21 Date of last refill: 07/19/21 Last refill amount: 90

## 2022-04-15 ENCOUNTER — Telehealth: Payer: Self-pay | Admitting: *Deleted

## 2022-04-15 NOTE — Patient Outreach (Signed)
  Care Coordination   04/15/2022 Name: Tracy [name] MRN: [id number] DOB: Dec 29, 1945   Care Coordination Outreach Attempts:  An unsuccessful telephone outreach was attempted today to offer the patient information about available care coordination services as a benefit of their health plan.   Follow Up Plan:  Additional outreach attempts will be made to offer the patient care coordination information and services.   Encounter Outcome:  No Answer  Care Coordination Interventions Activated:  No   Care Coordination Interventions:  No, not indicated    Raina Mina, RN Care Management Coordinator [place] Office [phone]

## 2022-04-22 ENCOUNTER — Ambulatory Visit: Payer: Self-pay

## 2022-04-22 NOTE — Patient Outreach (Signed)
  Care Coordination   04/22/2022 Name: Tracy [name] MRN: [id number] DOB: 09/11/1945   Care Coordination Outreach Attempts:  A second unsuccessful outreach was attempted today to offer the patient with information about available care coordination services as a benefit of their health plan.     Follow Up Plan:  Additional outreach attempts will be made to offer the patient care coordination information and services.   Encounter Outcome:  No Answer  Care Coordination Interventions Activated:  No   Care Coordination Interventions:  No, not indicated    Daneen Schick, BSW, CDP Social Worker, Certified Dementia Practitioner [facility] Care Management  Care Coordination [phone]

## 2022-04-24 ENCOUNTER — Other Ambulatory Visit: Payer: Self-pay

## 2022-04-24 DIAGNOSIS — C7951 Secondary malignant neoplasm of bone: Secondary | ICD-10-CM

## 2022-04-25 MED FILL — Dexamethasone Sodium Phosphate Inj 100 MG/10ML: INTRAMUSCULAR | Qty: 2 | Status: AC

## 2022-04-26 ENCOUNTER — Other Ambulatory Visit: Payer: Medicare Other

## 2022-04-26 ENCOUNTER — Inpatient Hospital Stay: Payer: Medicare Other

## 2022-04-26 ENCOUNTER — Inpatient Hospital Stay: Payer: Medicare Other | Admitting: Hematology

## 2022-04-26 ENCOUNTER — Other Ambulatory Visit: Payer: Self-pay

## 2022-04-26 ENCOUNTER — Ambulatory Visit: Payer: Medicare Other

## 2022-04-26 VITALS — BP 130/58 | HR 79 | Temp 97.2°F | Resp 17 | Ht 59.0 in | Wt 183.0 lb

## 2022-04-26 VITALS — BP 130/56 | HR 77 | Temp 98.5°F | Resp 16

## 2022-04-26 DIAGNOSIS — Z79899 Other long term (current) drug therapy: Secondary | ICD-10-CM | POA: Diagnosis not present

## 2022-04-26 DIAGNOSIS — Z7189 Other specified counseling: Secondary | ICD-10-CM | POA: Diagnosis not present

## 2022-04-26 DIAGNOSIS — C7951 Secondary malignant neoplasm of bone: Secondary | ICD-10-CM

## 2022-04-26 DIAGNOSIS — C9000 Multiple myeloma not having achieved remission: Secondary | ICD-10-CM

## 2022-04-26 DIAGNOSIS — Z853 Personal history of malignant neoplasm of breast: Secondary | ICD-10-CM | POA: Diagnosis not present

## 2022-04-26 DIAGNOSIS — E1140 Type 2 diabetes mellitus with diabetic neuropathy, unspecified: Secondary | ICD-10-CM | POA: Diagnosis not present

## 2022-04-26 DIAGNOSIS — Z5112 Encounter for antineoplastic immunotherapy: Secondary | ICD-10-CM | POA: Diagnosis not present

## 2022-04-26 DIAGNOSIS — I252 Old myocardial infarction: Secondary | ICD-10-CM | POA: Diagnosis not present

## 2022-04-26 DIAGNOSIS — D509 Iron deficiency anemia, unspecified: Secondary | ICD-10-CM | POA: Diagnosis not present

## 2022-04-26 DIAGNOSIS — Z923 Personal history of irradiation: Secondary | ICD-10-CM | POA: Diagnosis not present

## 2022-04-26 DIAGNOSIS — E538 Deficiency of other specified B group vitamins: Secondary | ICD-10-CM | POA: Diagnosis not present

## 2022-04-26 DIAGNOSIS — Z87891 Personal history of nicotine dependence: Secondary | ICD-10-CM | POA: Diagnosis not present

## 2022-04-26 DIAGNOSIS — N189 Chronic kidney disease, unspecified: Secondary | ICD-10-CM | POA: Diagnosis not present

## 2022-04-26 DIAGNOSIS — E1122 Type 2 diabetes mellitus with diabetic chronic kidney disease: Secondary | ICD-10-CM | POA: Diagnosis not present

## 2022-04-26 DIAGNOSIS — Z8673 Personal history of transient ischemic attack (TIA), and cerebral infarction without residual deficits: Secondary | ICD-10-CM | POA: Diagnosis not present

## 2022-04-26 LAB — CBC WITH DIFFERENTIAL (CANCER CENTER ONLY)
Abs Immature Granulocytes: 0.01 10*3/uL (ref 0.00–0.07)
Basophils Absolute: 0 10*3/uL (ref 0.0–0.1)
Basophils Relative: 1 %
Eosinophils Absolute: 0 10*3/uL (ref 0.0–0.5)
Eosinophils Relative: 0 %
HCT: 30.5 % — ABNORMAL LOW (ref 36.0–46.0)
Hemoglobin: 10.6 g/dL — ABNORMAL LOW (ref 12.0–15.0)
Immature Granulocytes: 0 %
Lymphocytes Relative: 34 %
Lymphs Abs: 1.6 10*3/uL (ref 0.7–4.0)
MCH: 32.1 pg (ref 26.0–34.0)
MCHC: 34.8 g/dL (ref 30.0–36.0)
MCV: 92.4 fL (ref 80.0–100.0)
Monocytes Absolute: 0.3 10*3/uL (ref 0.1–1.0)
Monocytes Relative: 7 %
Neutro Abs: 2.7 10*3/uL (ref 1.7–7.7)
Neutrophils Relative %: 58 %
Platelet Count: 271 10*3/uL (ref 150–400)
RBC: 3.3 MIL/uL — ABNORMAL LOW (ref 3.87–5.11)
RDW: 12.7 % (ref 11.5–15.5)
WBC Count: 4.8 10*3/uL (ref 4.0–10.5)
nRBC: 0 % (ref 0.0–0.2)

## 2022-04-26 LAB — CMP (CANCER CENTER ONLY)
ALT: 9 U/L (ref 0–44)
AST: 11 U/L — ABNORMAL LOW (ref 15–41)
Albumin: 4 g/dL (ref 3.5–5.0)
Alkaline Phosphatase: 77 U/L (ref 38–126)
Anion gap: 7 (ref 5–15)
BUN: 14 mg/dL (ref 8–23)
CO2: 26 mmol/L (ref 22–32)
Calcium: 8.9 mg/dL (ref 8.9–10.3)
Chloride: 107 mmol/L (ref 98–111)
Creatinine: 1.15 mg/dL — ABNORMAL HIGH (ref 0.44–1.00)
GFR, Estimated: 49 mL/min — ABNORMAL LOW
Glucose, Bld: 119 mg/dL — ABNORMAL HIGH (ref 70–99)
Potassium: 4.1 mmol/L (ref 3.5–5.1)
Sodium: 140 mmol/L (ref 135–145)
Total Bilirubin: 0.3 mg/dL (ref 0.3–1.2)
Total Protein: 6.5 g/dL (ref 6.5–8.1)

## 2022-04-26 MED ORDER — SODIUM CHLORIDE 0.9 % IV SOLN
16.0000 mg/kg | Freq: Once | INTRAVENOUS | Status: AC
  Administered 2022-04-26: 1400 mg via INTRAVENOUS
  Filled 2022-04-26: qty 60

## 2022-04-26 MED ORDER — SODIUM CHLORIDE 0.9 % IV SOLN
20.0000 mg | Freq: Once | INTRAVENOUS | Status: AC
  Administered 2022-04-26: 20 mg via INTRAVENOUS
  Filled 2022-04-26: qty 20

## 2022-04-26 MED ORDER — FAMOTIDINE IN NACL 20-0.9 MG/50ML-% IV SOLN
20.0000 mg | Freq: Once | INTRAVENOUS | Status: AC
  Administered 2022-04-26: 20 mg via INTRAVENOUS
  Filled 2022-04-26: qty 50

## 2022-04-26 MED ORDER — DIPHENHYDRAMINE HCL 25 MG PO CAPS
50.0000 mg | ORAL_CAPSULE | Freq: Once | ORAL | Status: AC
  Administered 2022-04-26: 50 mg via ORAL
  Filled 2022-04-26: qty 2

## 2022-04-26 MED ORDER — SODIUM CHLORIDE 0.9% FLUSH
10.0000 mL | Freq: Once | INTRAVENOUS | Status: AC | PRN
  Administered 2022-04-26: 10 mL

## 2022-04-26 MED ORDER — HEPARIN SOD (PORK) LOCK FLUSH 100 UNIT/ML IV SOLN
500.0000 [IU] | Freq: Once | INTRAVENOUS | Status: AC | PRN
  Administered 2022-04-26: 500 [IU]

## 2022-04-26 MED ORDER — SODIUM CHLORIDE 0.9 % IV SOLN: Freq: Once | INTRAVENOUS | Status: AC

## 2022-04-26 MED ORDER — SODIUM CHLORIDE 0.9% FLUSH
10.0000 mL | INTRAVENOUS | Status: DC | PRN
  Administered 2022-04-26: 10 mL

## 2022-04-26 MED ORDER — ACETAMINOPHEN 325 MG PO TABS
650.0000 mg | ORAL_TABLET | Freq: Once | ORAL | Status: AC
  Administered 2022-04-26: 650 mg via ORAL
  Filled 2022-04-26: qty 2

## 2022-04-26 NOTE — Patient Instructions (Signed)
[facility] MEDICAL ONCOLOGY  Discharge Instructions: Thank you for choosing [facility] to provide your oncology and hematology care.   If you have a lab appointment with the Cancer Center, please go directly to the Cancer Center and check in at the registration area.   Wear comfortable clothing and clothing appropriate for easy access to any Portacath or PICC line.   We strive to give you quality time with your provider. You may need to reschedule your appointment if you arrive late (15 or more minutes).  Arriving late affects you and other patients whose appointments are after yours.  Also, if you miss three or more appointments without notifying the office, you may be dismissed from the clinic at the provider's discretion.      For prescription refill requests, have your pharmacy contact our office and allow 72 hours for refills to be completed.    Today you received the following chemotherapy and/or immunotherapy agents: daratumumab      To help prevent nausea and vomiting after your treatment, we encourage you to take your nausea medication as directed.  BELOW ARE SYMPTOMS THAT SHOULD BE REPORTED IMMEDIATELY: *FEVER GREATER THAN 100.4 F (38 C) OR HIGHER *CHILLS OR SWEATING *NAUSEA AND VOMITING THAT IS NOT CONTROLLED WITH YOUR NAUSEA MEDICATION *UNUSUAL SHORTNESS OF BREATH *UNUSUAL BRUISING OR BLEEDING *URINARY PROBLEMS (pain or burning when urinating, or frequent urination) *BOWEL PROBLEMS (unusual diarrhea, constipation, pain near the anus) TENDERNESS IN MOUTH AND THROAT WITH OR WITHOUT PRESENCE OF ULCERS (sore throat, sores in mouth, or a toothache) UNUSUAL RASH, SWELLING OR PAIN  UNUSUAL VAGINAL DISCHARGE OR ITCHING   Items with * indicate a potential emergency and should be followed up as soon as possible or go to the Emergency Department if any problems should occur.  Please show the CHEMOTHERAPY ALERT CARD or IMMUNOTHERAPY ALERT CARD at check-in  to the Emergency Department and triage nurse.  Should you have questions after your visit or need to cancel or reschedule your appointment, please contact [facility]  Dept: [phone]  and follow the prompts.  Office hours are 8:00 a.m. to 4:30 p.m. Monday - Friday. Please note that voicemails left after 4:00 p.m. may not be returned until the following business day.  We are closed weekends and major holidays. You have access to a nurse at all times for urgent questions. Please call the main number to the clinic Dept: [phone] and follow the prompts.   For any non-urgent questions, you may also contact your provider using MyChart. We now offer e-Visits for anyone 18 and older to request care online for non-urgent symptoms. For details visit [email or web address].   Also download the MyChart app! Go to the app store, search "MyChart", open the app, select [facility], and log in with your MyChart username and password.  Masks are optional in the cancer centers. If you would like for your care team to wear a mask while they are taking care of you, please let them know. You may have one support person who is at least 78 years old accompany you for your appointments. 

## 2022-05-02 ENCOUNTER — Other Ambulatory Visit: Payer: Self-pay

## 2022-05-02 ENCOUNTER — Encounter: Payer: Self-pay | Admitting: Hematology

## 2022-05-02 DIAGNOSIS — C9000 Multiple myeloma not having achieved remission: Secondary | ICD-10-CM

## 2022-05-02 LAB — MULTIPLE MYELOMA PANEL, SERUM
Albumin SerPl Elph-Mcnc: 3.5 g/dL (ref 2.9–4.4)
Albumin/Glob SerPl: 1.3 (ref 0.7–1.7)
Alpha 1: 0.2 g/dL (ref 0.0–0.4)
Alpha2 Glob SerPl Elph-Mcnc: 1.2 g/dL — ABNORMAL HIGH (ref 0.4–1.0)
B-Globulin SerPl Elph-Mcnc: 0.9 g/dL (ref 0.7–1.3)
Gamma Glob SerPl Elph-Mcnc: 0.4 g/dL (ref 0.4–1.8)
Globulin, Total: 2.7 g/dL (ref 2.2–3.9)
IgA: 69 mg/dL (ref 64–422)
IgG (Immunoglobin G), Serum: 418 mg/dL — ABNORMAL LOW (ref 586–1602)
IgM (Immunoglobulin M), Srm: 48 mg/dL (ref 26–217)
M Protein SerPl Elph-Mcnc: 0.1 g/dL — ABNORMAL HIGH
Total Protein ELP: 6.2 g/dL (ref 6.0–8.5)

## 2022-05-02 NOTE — Progress Notes (Signed)
[facility] up:   DOS .04/26/2022  [name], MD [address] [place] [address]   CC: Follow-up for continued evaluation and management of multiple myeloma  SUMMARY OF ONCOLOGIC HISTORY: Oncology History  History of right breast cancer  04/16/2000 Surgery   Left breast: Triple negative  invasive ductal cancer treated with lumpectomy, adjuvant chemotherapy, radiation , in [place], unknown stage   06/07/2015 Mammogram   Right breast mass 6x 6 x 5 mm, right axillary lymph node with slight cortex thickening measured 5 mm    06/13/2015 Initial Diagnosis   Right breast needle biopsy: Invasive ductal carcinoma, grade 1, right axillary lymph node biopsy negative , ER 95%, PR 5%, Ki-67 10%, HER-2 negative   06/13/2015 Clinical Stage   Stage IA: T1b N0   07/07/2015 Surgery   Right lumpectomy: Invasive ductal carcinoma grade 1, 1 cm span, with low-grade DCIS, DCIS focally 0.1 cm to inferior margin, 0/3 lymph nodes negative, ER 95%, PR 5%, HER-2 negative ratio 1.1, Ki-67 10%   07/07/2015 Pathologic Stage   Stage IA: T1c N0   07/13/2015 Procedure   Breast High/Moderate Risk Panel reveals no clinically significant variant at ATM, BRCA1, BRCA2, CDH1, CHEK2, PALB2, PTEN, and TP53.     08/23/2015 - 09/21/2015 Radiation Therapy   Adjuvant Radiation: Right breast/ 42.5Gy at 2.5 Gy per fraction x 17 fractions.   Right breast boost/ 7.5 Gy at 2.5 Gy per fraction x 3 fractions    Anti-estrogen oral therapy   Patient refused antiestrogen therapy   10/20/2015 Survivorship   Survivorship care plan completed and mailed to patient in lieu of in person visit at her request   Iron deficiency anemia  Multiple myeloma ([place])  01/25/2020 Initial Diagnosis   Multiple myeloma ([place])   02/11/2020 - 05/19/2020 Adjuvant Chemotherapy   Daratumumab Weekly    02/29/2020 - 03/13/2020 Radiation Therapy   The targets were treated to a total dose of 20 Gy in 10  fractions of 2 Gy each to the left hip and L5 using one plan.   06/02/2020 - 10/05/2020 Adjuvant Chemotherapy   Daratumumab and Carfilzomib   10/19/2020 -  Adjuvant Chemotherapy   Maintenance Daratumumab--initially every 2 weeks, then every 4 weeks.    Malignant neoplasm metastatic to bone ([place])  02/08/2021 Initial Diagnosis   Bone metastases (HCC)     CURRENT THERAPY: Daratumumab/B12 injection   INTERVAL HISTORY:  Tracy [name] is here for further evaluation and management of multiple myeloma. She notes no acute new symptoms since her last clinic visit.  Bone pains.. No new toxicities from her daratumumab. No fevers no chills no night sweats no unexpected weight loss. No new medications. Labs done today were discussed in detail with the patient.   Patient Active Problem List   Diagnosis Date Noted   Malignant neoplasm metastatic to bone ([place]) 02/08/2021   Angiodysplasia of intestine 08/23/2020   Port-A-Cath in place 08/04/2020   Counseling regarding advance care planning and goals of care 02/07/2020   Multiple myeloma ([place]) 01/25/2020   Dizziness 10/12/2019   Post-nasal drainage 10/12/2019   Allergic rhinitis 08/19/2018   Type 2 diabetes mellitus with diabetic neuropathy, unspecified ([place]) 11/05/2017   Encounter for long-term use of muscle relaxants 09/24/2017   CAD in native artery 09/24/2017   OSA (obstructive sleep apnea) 09/24/2017   [place] 09/24/2017   Asthenia 06/30/2017   Morbid obesity ([place]) 06/02/2017   Depression 06/02/2017   Iron deficiency  anemia 09/23/2016   Anemia of chronic disease 09/28/2015   Genetic testing 08/21/2015   History of left breast cancer 07/13/2015   History of right breast cancer 06/20/2015   Hearing loss due to cerumen impaction 12/29/2014   Allergy to adhesive tape 05/19/2014   Hyperlipidemia 12/06/2013   GERD (gastroesophageal reflux disease) 12/06/2013   Cervical disc disease 11/11/2013   Osteopenia 05/25/2013   Allergic  asthma 12/24/2012   Insomnia 04/02/2012   Physical exam, annual 04/02/2012   HTN (hypertension) 02/03/2012   Vertigo, benign positional 02/03/2012   Left groin pain 02/03/2012   Hip pain 10/10/2011   TIA (transient ischemic attack) 07/24/2011   Allergic reaction 07/05/2011   Osteoarthrosis involving lower leg 10/19/2010   Disorder of bone and cartilage 05/01/2010   Generalized anxiety disorder 05/01/2010   Vitamin D deficiency 02/20/2010   Unspecified chronic bronchitis ([place]) 08/24/2009   Other lymphedema 10/29/2007    is allergic to bacitracin-neomycin-polymyxin  [neomycin-bacitracin zn-polymyx], nsaids, tape, ambien [zolpidem tartrate], amoxicillin, clavulanic acid, contrast media [iodinated contrast media], latex, prednisone, and tessalon [benzonatate].  MEDICAL HISTORY: Past Medical History:  Diagnosis Date   Angiodysplasia of intestine 08/23/2020   Anxiety    Arthritis    Breast cancer ([place]) 06/13/15   Cancer ([place]) 2000   breast cancer   Chronic bronchitis ([place])    Chronic bronchitis ([place])    Hyperlipidemia    Hypertension    Myocardial infarction [facility]) 2001   Personal history of radiation therapy    Restless leg    Stroke ([place]) 2004   TIA, no deficits    SURGICAL HISTORY: Past Surgical History:  Procedure Laterality Date   ABDOMINAL HYSTERECTOMY  1985   BREAST LUMPECTOMY Left 2000   radiation and chemo   BREAST LUMPECTOMY Right 2016   radiation   BREAST SURGERY  2001   lt breast lumpectomy   COLONOSCOPY WITH ESOPHAGOGASTRODUODENOSCOPY (EGD)  04/2020   ENTEROSCOPY N/A 03/15/2021   Procedure: ENTEROSCOPY;  Surgeon: Milus Banister, MD;  Location: [facility];  Service: Endoscopy;  Laterality: N/A;   GIVENS CAPSULE STUDY  07/2020   HOT HEMOSTASIS N/A 03/15/2021   Procedure: HOT HEMOSTASIS (ARGON PLASMA COAGULATION/BICAP);  Surgeon: Milus Banister, MD;  Location: [facility];  Service: Endoscopy;  Laterality: N/A;   IR IMAGING GUIDED PORT INSERTION   04/25/2020   RADIOACTIVE SEED GUIDED PARTIAL MASTECTOMY WITH AXILLARY SENTINEL LYMPH NODE BIOPSY Right 07/07/2015   Procedure: RIGHT RADIOACTIVE SEED GUIDED PARTIAL MASTECTOMY WITH AXILLARY SENTINEL LYMPH NODE BIOPSY;  Surgeon: Autumn Messing III, MD;  Location: [facility];  Service: General;  Laterality: Right;   SMALL INTESTINE SURGERY     TUBAL LIGATION     UPPER GASTROINTESTINAL ENDOSCOPY      SOCIAL HISTORY: Social History   Socioeconomic History   Marital status: Divorced    Spouse name: Not on file   Number of children: 7   Years of education: Not on file   Highest education level: Not on file  Occupational History   Occupation: retired  Tobacco Use   Smoking status: Former    Packs/day: 1.00    Years: 20.00    Total pack years: 20.00    Types: Cigarettes    Quit date: 07/30/2011    Years since quitting: 10.7   Smokeless tobacco: Never   Tobacco comments:    Quit >4 years ago; 1 ppd for about 5/20 years (remaining was less)  Vaping Use   Vaping Use: Former  Substance and  Sexual Activity   Alcohol use: No    Alcohol/week: 0.0 standard drinks of alcohol   Drug use: No   Sexual activity: Not Currently  Other Topics Concern   Not on file  Social History Narrative   Lives alone.  Retired.  Education:  11th grade GED.  Children:  7 (one here).    Social Determinants of Health   Financial Resource Strain: High Risk (12/21/2021)   Overall Financial Resource Strain (CARDIA)    Difficulty of Paying Living Expenses: Hard  Food Insecurity: Food Insecurity Present (12/21/2021)   Hunger Vital Sign    Worried About Running Out of Food in the Last Year: Often true    Ran Out of Food in the Last Year: Often true  Transportation Needs: No Transportation Needs (12/20/2021)   PRAPARE - Hydrologist (Medical): No    Lack of Transportation (Non-Medical): No  Physical Activity: Inactive (12/20/2021)   Exercise Vital Sign    Days of Exercise per  Week: 0 days    Minutes of Exercise per Session: 0 min  Stress: No Stress Concern Present (12/20/2021)   [place]    Feeling of Stress : Not at all  Social Connections: Moderately Integrated (12/20/2021)   Social Connection and Isolation Panel [NHANES]    Frequency of Communication with Friends and Family: More than three times a week    Frequency of Social Gatherings with Friends and Family: More than three times a week    Attends Religious Services: More than 4 times per year    Active Member of [employer] or Organizations: Yes    Attends Music therapist: More than 4 times per year    Marital Status: Divorced  Intimate Partner Violence: Not At Risk (12/20/2021)   Humiliation, Afraid, Rape, and Kick questionnaire    Fear of Current or Ex-Partner: No    Emotionally Abused: No    Physically Abused: No    Sexually Abused: No    FAMILY HISTORY: Family History  Problem Relation Age of Onset   Emphysema Mother 94       smoker   Diabetes Father    Lung cancer Sister        dx. <50; former smoker   Diabetes Brother    Diabetes Brother    Brain cancer Brother 97       unknown tumor type   Diabetes Paternal Aunt    Stroke Maternal Grandmother    Diabetes Paternal Grandmother    Cancer Daughter 15       neck cancer   Other Daughter        hysterectomy for unspecified reason   Colon cancer Daughter    Breast cancer Cousin    Cancer Cousin        unspecified type   Breast cancer Other        triple negative breast cancer in her 71s   Colon polyps Neg Hx    Esophageal cancer Neg Hx    Gallbladder disease Neg Hx    ROS 10 Point review of Systems was done is negative except as noted above.  PHYSICAL EXAMINATION  ECOG PERFORMANCE STATUS: 1 - Symptomatic but completely ambulatory  Vitals:   04/26/22 1056  BP: (!) 130/58  Pulse: 79  Resp: 17  Temp: (!) 97.2 F (36.2 C)  SpO2: 100%  .   NAD GENERAL:alert, in no acute distress and comfortable SKIN: no acute  rashes, no significant lesions EYES: conjunctiva are pink and non-injected, sclera anicteric OROPHARYNX: MMM, no exudates, no oropharyngeal erythema or ulceration NECK: supple, no JVD LYMPH:  no palpable lymphadenopathy in the cervical, axillary or inguinal regions LUNGS: clear to auscultation b/l with normal respiratory effort HEART: regular rate & rhythm ABDOMEN:  normoactive bowel sounds , non tender, not distended. Extremity: no pedal edema PSYCH: alert & oriented x 3 with fluent speech NEURO: no focal motor/sensory deficits    LABORATORY DATA: .    Latest Ref Rng & Units 04/26/2022   10:31 AM 03/29/2022   10:49 AM 03/07/2022   10:41 AM  CBC  WBC 4.0 - 10.5 K/uL 4.8  4.4  5.0   Hemoglobin 12.0 - 15.0 g/dL 10.6  10.4  10.5   Hematocrit 36.0 - 46.0 % 30.5  30.0  30.5   Platelets 150 - 400 K/uL 271  271  270    .    Latest Ref Rng & Units 04/26/2022   10:31 AM 03/29/2022   10:49 AM 03/07/2022   10:41 AM  CMP  Glucose 70 - 99 mg/dL 119  101  137   BUN 8 - 23 mg/dL '14  11  15   ' Creatinine 0.44 - 1.00 mg/dL 1.15  1.16  1.27   Sodium 135 - 145 mmol/L 140  142  142   Potassium 3.5 - 5.1 mmol/L 4.1  3.8  3.6   Chloride 98 - 111 mmol/L 107  109  108   CO2 22 - 32 mmol/L '26  26  26   ' Calcium 8.9 - 10.3 mg/dL 8.9  9.3  8.9   Total Protein 6.5 - 8.1 g/dL 6.5  6.4  6.7   Total Bilirubin 0.3 - 1.2 mg/dL 0.3  0.3  0.4   Alkaline Phos 38 - 126 U/L 77  69  61   AST 15 - 41 U/L '11  13  13   ' ALT 0 - 44 U/L '9  9  10    ' ASSESSMENT and THERAPY PLAN:   76 yo here for follow-up of her for follow-up of her multiple myeloma   1)  IgG Kappa Multiple myeloma with bone lesions, anemia, renal insuff. M spike @ 3.7g/dl on diagnosis. 1p deletion, polymorphic variant, 13q deletion Multiple myeloma panel from 06/28/2021 with no M spike.  IFE positive for IgG kappa monoclonal protein possibly from her daratumumab. Currently on  monthly daratumumab 2) h/o DM2 3) Diabetic Neuropathy 4) CKD - likely from DM2, but could have an element of myeloma kidney. 5) h/o TIA and AMI 6) Iron deficiency 7) B12 deficiency   PLAN: Patient's labs done today were discussed in detail with the patient CBC shows stable hemoglobin of 10.6 with normal WBC count and normal platelets CMP stable with stable creatinine of 1.15 and no hypercalcemia Myeloma panel months ago showed no M spike.  Labs today show M spike of 0.1 g/dL his IgG kappa.  It is unclear if this is from her daratumumab infusion or from her plasma cell neoplasm.  We will continue to monitor. Patient has no notable toxicities from daratumumab at this time.  We will continue daratumumab monthly at this time. Continue maintenance Aredia every 8 weeks Continue B12 1000 mcg subcu every 4 weeks for B12 deficiency. Epic beacon live treatment updated.  Follow-up please schedule next 4 doses of daratumumab every 4 weeks with port flush and labs. Continue B12 subcutaneous injection every 4 weeks Continue Aredia every 8 weeks MD  visit in 8 weeks  The total time spent in the appointment was 30 minutes*.  All of the patient's questions were answered with apparent satisfaction. The patient knows to call the clinic with any problems, questions or concerns.   [name] MD MS AAHIVMS [facility] Hematology/Oncology Physician [facility]  .*Total Encounter Time as defined by the Centers for Medicare and Medicaid Services includes, in addition to the face-to-face time of a patient visit (documented in the note above) non-face-to-face time: obtaining and reviewing outside history, ordering and reviewing medications, tests or procedures, care coordination (communications with other health care professionals or caregivers) and documentation in the medical record.

## 2022-05-03 ENCOUNTER — Other Ambulatory Visit: Payer: Self-pay

## 2022-05-03 ENCOUNTER — Inpatient Hospital Stay: Payer: Medicare Other

## 2022-05-03 ENCOUNTER — Inpatient Hospital Stay: Payer: Medicare Other | Attending: Hematology

## 2022-05-03 VITALS — BP 134/74 | HR 90 | Temp 99.5°F | Resp 18 | Ht 59.0 in | Wt 181.0 lb

## 2022-05-03 DIAGNOSIS — E1122 Type 2 diabetes mellitus with diabetic chronic kidney disease: Secondary | ICD-10-CM | POA: Insufficient documentation

## 2022-05-03 DIAGNOSIS — E538 Deficiency of other specified B group vitamins: Secondary | ICD-10-CM | POA: Diagnosis not present

## 2022-05-03 DIAGNOSIS — Z95828 Presence of other vascular implants and grafts: Secondary | ICD-10-CM

## 2022-05-03 DIAGNOSIS — E1140 Type 2 diabetes mellitus with diabetic neuropathy, unspecified: Secondary | ICD-10-CM | POA: Insufficient documentation

## 2022-05-03 DIAGNOSIS — Z923 Personal history of irradiation: Secondary | ICD-10-CM | POA: Diagnosis not present

## 2022-05-03 DIAGNOSIS — Z79899 Other long term (current) drug therapy: Secondary | ICD-10-CM | POA: Insufficient documentation

## 2022-05-03 DIAGNOSIS — N189 Chronic kidney disease, unspecified: Secondary | ICD-10-CM | POA: Diagnosis not present

## 2022-05-03 DIAGNOSIS — I252 Old myocardial infarction: Secondary | ICD-10-CM | POA: Insufficient documentation

## 2022-05-03 DIAGNOSIS — Z853 Personal history of malignant neoplasm of breast: Secondary | ICD-10-CM | POA: Insufficient documentation

## 2022-05-03 DIAGNOSIS — Z87891 Personal history of nicotine dependence: Secondary | ICD-10-CM | POA: Diagnosis not present

## 2022-05-03 DIAGNOSIS — Z8673 Personal history of transient ischemic attack (TIA), and cerebral infarction without residual deficits: Secondary | ICD-10-CM | POA: Insufficient documentation

## 2022-05-03 DIAGNOSIS — Z5112 Encounter for antineoplastic immunotherapy: Secondary | ICD-10-CM | POA: Insufficient documentation

## 2022-05-03 DIAGNOSIS — C7951 Secondary malignant neoplasm of bone: Secondary | ICD-10-CM

## 2022-05-03 DIAGNOSIS — D509 Iron deficiency anemia, unspecified: Secondary | ICD-10-CM | POA: Diagnosis not present

## 2022-05-03 DIAGNOSIS — C9000 Multiple myeloma not having achieved remission: Secondary | ICD-10-CM | POA: Insufficient documentation

## 2022-05-03 LAB — CMP (CANCER CENTER ONLY)
ALT: 9 U/L (ref 0–44)
AST: 11 U/L — ABNORMAL LOW (ref 15–41)
Albumin: 4.2 g/dL (ref 3.5–5.0)
Alkaline Phosphatase: 81 U/L (ref 38–126)
Anion gap: 9 (ref 5–15)
BUN: 12 mg/dL (ref 8–23)
CO2: 25 mmol/L (ref 22–32)
Calcium: 8.7 mg/dL — ABNORMAL LOW (ref 8.9–10.3)
Chloride: 108 mmol/L (ref 98–111)
Creatinine: 1.23 mg/dL — ABNORMAL HIGH (ref 0.44–1.00)
GFR, Estimated: 45 mL/min — ABNORMAL LOW (ref >60)
Glucose, Bld: 116 mg/dL — ABNORMAL HIGH (ref 70–99)
Potassium: 3.9 mmol/L (ref 3.5–5.1)
Sodium: 142 mmol/L (ref 135–145)
Total Bilirubin: 0.4 mg/dL (ref 0.3–1.2)
Total Protein: 6.7 g/dL (ref 6.5–8.1)

## 2022-05-03 LAB — CBC WITH DIFFERENTIAL (CANCER CENTER ONLY)
Abs Immature Granulocytes: 0.01 10*3/uL (ref 0.00–0.07)
Basophils Absolute: 0 10*3/uL (ref 0.0–0.1)
Basophils Relative: 1 %
Eosinophils Absolute: 0 10*3/uL (ref 0.0–0.5)
Eosinophils Relative: 0 %
HCT: 30.8 % — ABNORMAL LOW (ref 36.0–46.0)
Hemoglobin: 10.6 g/dL — ABNORMAL LOW (ref 12.0–15.0)
Immature Granulocytes: 0 %
Lymphocytes Relative: 34 %
Lymphs Abs: 1.8 10*3/uL (ref 0.7–4.0)
MCH: 32.1 pg (ref 26.0–34.0)
MCHC: 34.4 g/dL (ref 30.0–36.0)
MCV: 93.3 fL (ref 80.0–100.0)
Monocytes Absolute: 0.3 10*3/uL (ref 0.1–1.0)
Monocytes Relative: 6 %
Neutro Abs: 3.1 10*3/uL (ref 1.7–7.7)
Neutrophils Relative %: 59 %
Platelet Count: 294 10*3/uL (ref 150–400)
RBC: 3.3 MIL/uL — ABNORMAL LOW (ref 3.87–5.11)
RDW: 12.9 % (ref 11.5–15.5)
WBC Count: 5.3 10*3/uL (ref 4.0–10.5)
nRBC: 0 % (ref 0.0–0.2)

## 2022-05-03 MED ORDER — SODIUM CHLORIDE 0.9 % IV SOLN
60.0000 mg | Freq: Once | INTRAVENOUS | Status: DC
  Filled 2022-05-03: qty 20

## 2022-05-03 MED ORDER — SODIUM CHLORIDE 0.9% FLUSH
10.0000 mL | Freq: Once | INTRAVENOUS | Status: AC
  Administered 2022-05-03: 10 mL

## 2022-05-03 MED ORDER — CYANOCOBALAMIN 1000 MCG/ML IJ SOLN
1000.0000 ug | Freq: Once | INTRAMUSCULAR | Status: AC
  Administered 2022-05-03: 1000 ug via INTRAMUSCULAR
  Filled 2022-05-03: qty 1

## 2022-05-03 MED ORDER — SODIUM CHLORIDE 0.9 % IV SOLN
60.0000 mg | Freq: Once | INTRAVENOUS | Status: AC
  Administered 2022-05-03: 60 mg via INTRAVENOUS
  Filled 2022-05-03: qty 10

## 2022-05-03 MED ORDER — HEPARIN SOD (PORK) LOCK FLUSH 100 UNIT/ML IV SOLN
500.0000 [IU] | Freq: Once | INTRAVENOUS | Status: AC
  Administered 2022-05-03: 500 [IU]

## 2022-05-03 MED ORDER — SODIUM CHLORIDE 0.9 % IV SOLN: INTRAVENOUS | Status: DC

## 2022-05-03 NOTE — Progress Notes (Signed)
Per Dr. Irene Limbo OK to proceed with Aredia today with calcium 8.7 mg/dL

## 2022-05-03 NOTE — Patient Instructions (Addendum)
Pamidronate Injection What is this medication? PAMIDRONATE (pa mi DROE nate) treats high calcium levels in the blood caused by cancer. It may also be used with chemotherapy to treat weakened bones caused by cancer. It can also be used to treat Paget's disease of the bone. It works by slowing down the release of calcium from bones. This lowers calcium levels in your blood. It also makes your bones stronger and less likely to break (fracture). It belongs to a group of medications called bisphosphonates. This medicine may be used for other purposes; ask your health care provider or pharmacist if you have questions. COMMON BRAND NAME(S): Aredia What should I tell my care team before I take this medication? They need to know if you have any of these conditions: Bleeding disorder Cancer Dental disease Kidney disease Low levels of calcium or other minerals in the blood Low red blood cell counts Receiving steroids, such as dexamethasone or prednisone An unusual or allergic reaction to pamidronate, other medications, foods, dyes or preservatives Pregnant or trying to get pregnant Breast-feeding How should I use this medication? This medication is injected into a vein. It is given by your care team in a hospital or clinic setting. Talk to your care team about the use of this medication in children. Special care may be needed. Overdosage: If you think you have taken too much of this medicine contact a poison control center or emergency room at once. NOTE: This medicine is only for you. Do not share this medicine with others. What if I miss a dose? Keep appointments for follow-up doses. It is important not to miss your dose. Call your care team if you are unable to keep an appointment. What may interact with this medication? Certain antibiotics given by injection Medications for inflammation or pain, such as ibuprofen, naproxen Some diuretics, such as bumetanide, furosemide Cyclosporine Parathyroid  hormone Tacrolimus Teriparatide Thalidomide This list may not describe all possible interactions. Give your health care provider a list of all the medicines, herbs, non-prescription drugs, or dietary supplements you use. Also tell them if you smoke, drink alcohol, or use illegal drugs. Some items may interact with your medicine. What should I watch for while using this medication? Visit your care team for regular checks on your progress. It may be some time before you see the benefit from this medication. Some people who take this medication have severe bone, joint, or muscle pain. This medication may also increase your risk for jaw problems or a broken thigh bone. Tell your care team right away if you have severe pain in your jaw, bones, joints, or muscles. Tell your care team if you have any pain that does not go away or that gets worse. Tell your dentist and dental surgeon that you are taking this medication. You should not have major dental surgery while on this medication. See your dentist to have a dental exam and fix any dental problems before starting this medication. Take good care of your teeth while on this medication. Make sure you see your dentist for regular follow-up appointments. You should make sure you get enough calcium and vitamin D while you are taking this medication. Discuss the foods you eat and the vitamins you take with your care team. You may need bloodwork while you are taking this medication. Talk to your care team if you wish to become pregnant or think you might be pregnant. This medication can cause serious birth defects. What side effects may I notice from receiving  this medication? Side effects that you should report to your care team as soon as possible: Allergic reactions--skin rash, itching, hives, swelling of the face, lips, tongue, or throat Kidney injury--decrease in the amount of urine, swelling of the ankles, hands, or feet Low calcium level--muscle pain or  cramps, confusion, tingling, or numbness in the hands or feet Osteonecrosis of the jaw--pain, swelling, or redness in the mouth, numbness of the jaw, poor healing after dental work, unusual discharge from the mouth, visible bones in the mouth Severe bone, joint, or muscle pain Side effects that usually do not require medical attention (report to your care team if they continue or are bothersome): Constipation Fatigue Fever Loss of appetite Nausea Pain, redness, or irritation at injection site Stomach pain This list may not describe all possible side effects. Call your doctor for medical advice about side effects. You may report side effects to FDA at 1-800-FDA-1088. Where should I keep my medication? This medication is given in a hospital or clinic. It will not be stored at home. NOTE: This sheet is a summary. It may not cover all possible information. If you have questions about this medicine, talk to your doctor, pharmacist, or health care provider.  2023 Elsevier/Gold Standard (2021-08-30 00:00:00) Vitamin B12 Injection What is this medication? Vitamin B12 (VAHY tuh min B12) prevents and treats low vitamin B12 levels in your body. It is used in people who do not get enough vitamin B12 from their diet or when their digestive tract does not absorb enough. Vitamin B12 plays an important role in maintaining the health of your nervous system and red blood cells. This medicine may be used for other purposes; ask your health care provider or pharmacist if you have questions. COMMON BRAND NAME(S): B-12 Compliance Kit, B-12 Injection Kit, Cyomin, Dodex, LA-12, Nutri-Twelve, Physicians EZ Use B-12, Primabalt What should I tell my care team before I take this medication? They need to know if you have any of these conditions: Kidney disease Leber's disease Megaloblastic anemia An unusual or allergic reaction to cyanocobalamin, cobalt, other medications, foods, dyes, or preservatives Pregnant or  trying to get pregnant Breast-feeding How should I use this medication? This medication is injected into a muscle or deeply under the skin. It is usually given in a clinic or care team's office. However, your care team may teach you how to inject yourself. Follow all instructions. Talk to your care team about the use of this medication in children. Special care may be needed. Overdosage: If you think you have taken too much of this medicine contact a poison control center or emergency room at once. NOTE: This medicine is only for you. Do not share this medicine with others. What if I miss a dose? If you are given your dose at a clinic or care team's office, call to reschedule your appointment. If you give your own injections, and you miss a dose, take it as soon as you can. If it is almost time for your next dose, take only that dose. Do not take double or extra doses. What may interact with this medication? Alcohol Colchicine This list may not describe all possible interactions. Give your health care provider a list of all the medicines, herbs, non-prescription drugs, or dietary supplements you use. Also tell them if you smoke, drink alcohol, or use illegal drugs. Some items may interact with your medicine. What should I watch for while using this medication? Visit your care team regularly. You may need blood work done  while you are taking this medication. You may need to follow a special diet. Talk to your care team. Limit your alcohol intake and avoid smoking to get the best benefit. What side effects may I notice from receiving this medication? Side effects that you should report to your care team as soon as possible: Allergic reactions--skin rash, itching, hives, swelling of the face, lips, tongue, or throat Swelling of the ankles, hands, or feet Trouble breathing Side effects that usually do not require medical attention (report to your care team if they continue or are  bothersome): Diarrhea This list may not describe all possible side effects. Call your doctor for medical advice about side effects. You may report side effects to FDA at 1-800-FDA-1088. Where should I keep my medication? Keep out of the reach of children. Store at room temperature between 15 and 30 degrees C (59 and 85 degrees F). Protect from light. Throw away any unused medication after the expiration date. NOTE: This sheet is a summary. It may not cover all possible information. If you have questions about this medicine, talk to your doctor, pharmacist, or health care provider.  2023 Elsevier/Gold Standard (2020-09-21 00:00:00)

## 2022-05-07 ENCOUNTER — Other Ambulatory Visit: Payer: Self-pay | Admitting: Hematology

## 2022-05-07 DIAGNOSIS — C7951 Secondary malignant neoplasm of bone: Secondary | ICD-10-CM

## 2022-05-07 DIAGNOSIS — C9000 Multiple myeloma not having achieved remission: Secondary | ICD-10-CM

## 2022-05-07 DIAGNOSIS — Z7189 Other specified counseling: Secondary | ICD-10-CM

## 2022-05-07 NOTE — Progress Notes (Signed)
[facility] go live orders updated

## 2022-05-10 ENCOUNTER — Telehealth: Payer: Self-pay

## 2022-05-10 NOTE — Patient Outreach (Signed)
  Care Coordination   05/10/2022 Name: Tracy [name] MRN: [id number] DOB: 1945/11/23   Care Coordination Outreach Attempts:  A third unsuccessful outreach was attempted today to offer the patient with information about available care coordination services as a benefit of their health plan.   Follow Up Plan:  No further outreach attempts will be made at this time. We have been unable to contact the patient to offer or enroll patient in care coordination services  Encounter Outcome:  No Answer  Care Coordination Interventions Activated:  No   Care Coordination Interventions:  No, not indicated    Peter Garter RN, BSN,CCM, [place] [facility] [phone]

## 2022-05-14 ENCOUNTER — Telehealth: Payer: Self-pay

## 2022-05-14 NOTE — Patient Instructions (Signed)
Visit Information  Thank you for taking time to visit with me today. Please don't hesitate to contact me if I can be of assistance to you.   Following are the goals we discussed today:   Goals Addressed             This Visit's Progress    Pt will find affordable housing       Care Coordination Interventions: Provided education to patient re: Annual Wellness Visit, care coordination services Social Work referral for housing issues with section 8 Assessed social determinant of health barriers Completed Annual Wellness Visit 12/20/21        Our next appointment is by telephone on 05/15/22 at 2 PM with Daneen Schick BSW  Please call the care guide team at [phone] if you need to cancel or reschedule your appointment.   If you are experiencing a Mental Health or [place] or need someone to talk to, please call the Suicide and Crisis Lifeline: 988 call the [place] National Suicide Prevention Lifeline: (838) 834-4735 or TTY: 978-319-2910 TTY 219-854-9403) to talk to a trained counselor call 1-800-273-TALK (toll free, 24 hour hotline) go to [facility] [address] [place] [phone]) call 911   Patient verbalizes understanding of instructions and care plan provided today and agrees to view in [place]. Active MyChart status and patient understanding of how to access instructions and care plan via MyChart confirmed with patient.     Telephone follow up appointment with care management team member scheduled for:05/15/22 at 2 PM   [place], [facility], [place] Management Coordinator [place] [facility] [phone]

## 2022-05-14 NOTE — Patient Outreach (Signed)
  Care Coordination   Initial Visit Note   05/14/2022 Name: DENETTE HASS MRN: [id number] DOB: 04-14-1946  Doneen Poisson Edelstein is a 76 y.o. year old female who sees [name], Aundra Millet, MD for primary care. I spoke with  Doneen Poisson Full by phone today.  What matters to the patients health and wellness today?  I am having problems with losing my section 8  housing.  I am handling my health issues without problems   Goals Addressed             This Visit's Progress    Pt will find affordable housing       Care Coordination Interventions: Provided education to patient re: Annual Wellness Visit, care coordination services Social Work referral for housing issues with section 8 Assessed social determinant of health barriers Completed Annual Wellness Visit 12/20/21        SDOH assessments and interventions completed:  Yes  SDOH Interventions Today    Flowsheet Row Most Recent Value  SDOH Interventions   Housing Interventions Other (Comment)  [referred to BSW]  Transportation Interventions Intervention Not Indicated  Utilities Interventions Intervention Not Indicated        Care Coordination Interventions Activated:  Yes  Care Coordination Interventions:  Yes, provided   Follow up plan: Follow up call scheduled for 05/15/22 with BSW    Encounter Outcome:  Pt. Visit Completed  Peter Garter RN, [facility], [place] [facility] [phone]

## 2022-05-15 ENCOUNTER — Ambulatory Visit: Payer: Self-pay

## 2022-05-15 NOTE — Patient Outreach (Signed)
  Care Coordination   Follow Up Visit Note   05/15/2022 Name: Tracy [name] MRN: [id number] DOB: Nov 09, 1945  Tracy [name] is a 76 y.o. year old female who sees [name], Aundra Millet, MD for primary care. I spoke with  Tracy [name] by phone today.  What matters to the patients health and wellness today?  To obtain an inspection date from section 8    Goals Addressed             This Visit's Progress    Care Coordination Activities       Care Coordination Interventions: Referral received to assist the patient with housing needs Determined the patients current apartment notified the patient in July they would no longer be accepting Section 8 after a certain date Discussed the patient work with Section 8 to identify a new housing option which was selected in September. The new option did not pass initial inspection but the apartment manager has advised the patient they have corrected all concerns Patient reports she was supposed to be moved out of her current home last week but has to wait for the new apartment to pass a second inspection. Unfortunately she has not been able to connect with [employer] to determine when this inspection will take place Discussed plan for SW to reach out to [employer] on the patients behalf in an attempt to identify a new inspection date Unsuccessful outbound call placed to [employer] - voice message left requesting a return call Secure email collaboration with Tracy [name], Financial Accountant with [employer] to request feedback on who to contact with [employer] with social work colleagues to request feedback on other contacts with [facility]         SDOH assessments and interventions completed:  No     Care Coordination Interventions Activated:  Yes  Care Coordination Interventions:  Yes, provided   Follow up plan:  SW will continue to follow    Encounter Outcome:  Pt.  Visit Completed

## 2022-05-15 NOTE — Patient Instructions (Signed)
Visit Information  Thank you for taking time to visit with me today. Please don't hesitate to contact me if I can be of assistance to you.   Following are the goals we discussed today:   Goals Addressed             This Visit's Progress    Care Coordination Activities       Care Coordination Interventions: Referral received to assist the patient with housing needs Determined the patients current apartment notified the patient in July they would no longer be accepting Section 8 after a certain date Discussed the patient work with Section 8 to identify a new housing option which was selected in September. The new option did not pass initial inspection but the apartment manager has advised the patient they have corrected all concerns Patient reports she was supposed to be moved out of her current home last week but has to wait for the new apartment to pass a second inspection. Unfortunately she has not been able to connect with [employer] to determine when this inspection will take place Discussed plan for SW to reach out to [employer] on the patients behalf in an attempt to identify a new inspection date Unsuccessful outbound call placed to [employer] - voice message left requesting a return call Secure email collaboration with Marlowe Sax, Financial Accountant with [employer] to request feedback on who to contact with [employer] with social work colleagues to request feedback on other contacts with [facility]         If you are experiencing a [place] or [place] or need someone to talk to, please call 1-800-273-TALK (toll free, 24 hour hotline)  Patient verbalizes understanding of instructions and care plan provided today and agrees to view in [place]. Active MyChart status and patient understanding of how to access instructions and care plan via MyChart confirmed with patient.     Daneen Schick, BSW, CDP Social Worker, Certified Dementia Practitioner [place] [facility]  Care Coordination [phone]

## 2022-05-16 ENCOUNTER — Ambulatory Visit: Payer: Self-pay

## 2022-05-16 NOTE — Patient Outreach (Signed)
  Care Coordination   Follow Up Visit Note   05/16/2022 Name: Tracy [name] MRN: [id number] DOB: [birth year]  Tracy [name] is a 76 y.o. year old female who sees [name], Aundra Millet, MD for primary care. I spoke with  Tracy [name] by phone today.  What matters to the patients health and wellness today?  To obtain a section 8 inspection    Goals Addressed             This Visit's Progress    COMPLETED: Care Coordination Activities       Care Coordination Interventions: Collaboration with patients section 8 caseworker Tracy [name]) who reports the patients new prospective landlord will have to contact Tracy [name] to schedule a re-inspection Obtained two e-mail contacts for the prospective landlord to utilize:              Tracy [name]: [email or web address]              Tracy [name]: [email or web address] Discussed Tracy [name] does not have access to the inspectors schedule and therefore is unable to identify when the re-inspection may take place Successful call placed to the patient who reports the new landlord has reached out multiple times and has not received a response Attempted to provide patient with above e-mail contacts - patient declined Encouraged patient to contact her section 8 caseworker as needed         SDOH assessments and interventions completed:  No     Care Coordination Interventions Activated:  Yes  Care Coordination Interventions:  Yes, provided   Follow up plan: No further intervention required.   Encounter Outcome:  Pt. Visit Completed   Tracy [name], BSW, CDP Social Worker, Certified Dementia Practitioner [place] [facility]  Care Coordination [phone]

## 2022-05-16 NOTE — Patient Instructions (Signed)
Visit Information  Thank you for taking time to visit with me today. Please don't hesitate to contact me if I can be of assistance to you.   Following are the goals we discussed today:   Goals Addressed             This Visit's Progress    COMPLETED: Care Coordination Activities       Care Coordination Interventions: Collaboration with patients section 8 caseworker Tally Due [phone]) who reports the patients new prospective landlord will have to contact [facility] to schedule a re-inspection Obtained two e-mail contacts for the prospective landlord to utilize:              Veatrice Bourbon: [email or web address]              Camron Weaver: [email or web address] Discussed [name] does not have access to the inspectors schedule and therefore is unable to identify when the re-inspection may take place Successful call placed to the patient who reports the new landlord has reached out multiple times and has not received a response Attempted to provide patient with above e-mail contacts - patient declined Encouraged patient to contact her section 8 caseworker as needed         If you are experiencing a Mental Health or [place] or need someone to talk to, please call 1-800-273-TALK (toll free, 24 hour hotline)  Patient verbalizes understanding of instructions and care plan provided today and agrees to view in [place]. Active MyChart status and patient understanding of how to access instructions and care plan via MyChart confirmed with patient.     No further follow up required: Please contact me as needed  Daneen Schick, BSW, CDP Social Worker, Certified Dementia Practitioner [place] [phone]

## 2022-05-24 ENCOUNTER — Other Ambulatory Visit: Payer: Self-pay

## 2022-05-24 ENCOUNTER — Inpatient Hospital Stay (HOSPITAL_BASED_OUTPATIENT_CLINIC_OR_DEPARTMENT_OTHER): Payer: Medicare Other | Admitting: Hematology

## 2022-05-24 ENCOUNTER — Inpatient Hospital Stay: Payer: Medicare Other

## 2022-05-24 VITALS — BP 147/64 | HR 91 | Temp 98.6°F | Resp 16

## 2022-05-24 DIAGNOSIS — Z7189 Other specified counseling: Secondary | ICD-10-CM

## 2022-05-24 DIAGNOSIS — I252 Old myocardial infarction: Secondary | ICD-10-CM | POA: Diagnosis not present

## 2022-05-24 DIAGNOSIS — E1140 Type 2 diabetes mellitus with diabetic neuropathy, unspecified: Secondary | ICD-10-CM | POA: Diagnosis not present

## 2022-05-24 DIAGNOSIS — D509 Iron deficiency anemia, unspecified: Secondary | ICD-10-CM | POA: Diagnosis not present

## 2022-05-24 DIAGNOSIS — C7951 Secondary malignant neoplasm of bone: Secondary | ICD-10-CM

## 2022-05-24 DIAGNOSIS — Z923 Personal history of irradiation: Secondary | ICD-10-CM | POA: Diagnosis not present

## 2022-05-24 DIAGNOSIS — E1122 Type 2 diabetes mellitus with diabetic chronic kidney disease: Secondary | ICD-10-CM | POA: Diagnosis not present

## 2022-05-24 DIAGNOSIS — Z8673 Personal history of transient ischemic attack (TIA), and cerebral infarction without residual deficits: Secondary | ICD-10-CM | POA: Diagnosis not present

## 2022-05-24 DIAGNOSIS — Z95828 Presence of other vascular implants and grafts: Secondary | ICD-10-CM

## 2022-05-24 DIAGNOSIS — C9000 Multiple myeloma not having achieved remission: Secondary | ICD-10-CM

## 2022-05-24 DIAGNOSIS — Z87891 Personal history of nicotine dependence: Secondary | ICD-10-CM | POA: Diagnosis not present

## 2022-05-24 DIAGNOSIS — E538 Deficiency of other specified B group vitamins: Secondary | ICD-10-CM | POA: Diagnosis not present

## 2022-05-24 DIAGNOSIS — Z79899 Other long term (current) drug therapy: Secondary | ICD-10-CM | POA: Diagnosis not present

## 2022-05-24 DIAGNOSIS — Z853 Personal history of malignant neoplasm of breast: Secondary | ICD-10-CM | POA: Diagnosis not present

## 2022-05-24 DIAGNOSIS — Z5112 Encounter for antineoplastic immunotherapy: Secondary | ICD-10-CM | POA: Diagnosis not present

## 2022-05-24 DIAGNOSIS — N189 Chronic kidney disease, unspecified: Secondary | ICD-10-CM | POA: Diagnosis not present

## 2022-05-24 LAB — CBC WITH DIFFERENTIAL (CANCER CENTER ONLY)
Abs Immature Granulocytes: 0.01 10*3/uL (ref 0.00–0.07)
Basophils Absolute: 0 10*3/uL (ref 0.0–0.1)
Basophils Relative: 1 %
Eosinophils Absolute: 0 10*3/uL (ref 0.0–0.5)
Eosinophils Relative: 0 %
HCT: 30.7 % — ABNORMAL LOW (ref 36.0–46.0)
Hemoglobin: 10.5 g/dL — ABNORMAL LOW (ref 12.0–15.0)
Immature Granulocytes: 0 %
Lymphocytes Relative: 39 %
Lymphs Abs: 2 10*3/uL (ref 0.7–4.0)
MCH: 32 pg (ref 26.0–34.0)
MCHC: 34.2 g/dL (ref 30.0–36.0)
MCV: 93.6 fL (ref 80.0–100.0)
Monocytes Absolute: 0.3 10*3/uL (ref 0.1–1.0)
Monocytes Relative: 7 %
Neutro Abs: 2.7 10*3/uL (ref 1.7–7.7)
Neutrophils Relative %: 53 %
Platelet Count: 298 10*3/uL (ref 150–400)
RBC: 3.28 MIL/uL — ABNORMAL LOW (ref 3.87–5.11)
RDW: 13.2 % (ref 11.5–15.5)
WBC Count: 5 10*3/uL (ref 4.0–10.5)
nRBC: 0 % (ref 0.0–0.2)

## 2022-05-24 LAB — CMP (CANCER CENTER ONLY)
ALT: 9 U/L (ref 0–44)
AST: 12 U/L — ABNORMAL LOW (ref 15–41)
Albumin: 4.1 g/dL (ref 3.5–5.0)
Alkaline Phosphatase: 69 U/L (ref 38–126)
Anion gap: 9 (ref 5–15)
BUN: 15 mg/dL (ref 8–23)
CO2: 25 mmol/L (ref 22–32)
Calcium: 9.5 mg/dL (ref 8.9–10.3)
Chloride: 108 mmol/L (ref 98–111)
Creatinine: 1.28 mg/dL — ABNORMAL HIGH (ref 0.44–1.00)
GFR, Estimated: 43 mL/min — ABNORMAL LOW (ref >60)
Glucose, Bld: 119 mg/dL — ABNORMAL HIGH (ref 70–99)
Potassium: 4.1 mmol/L (ref 3.5–5.1)
Sodium: 142 mmol/L (ref 135–145)
Total Bilirubin: 0.3 mg/dL (ref 0.3–1.2)
Total Protein: 6.7 g/dL (ref 6.5–8.1)

## 2022-05-24 MED ORDER — SODIUM CHLORIDE 0.9 % IV SOLN
16.0000 mg | Freq: Once | INTRAVENOUS | Status: AC
  Administered 2022-05-24: 16 mg via INTRAVENOUS
  Filled 2022-05-24: qty 1.6

## 2022-05-24 MED ORDER — SODIUM CHLORIDE 0.9% FLUSH
10.0000 mL | Freq: Once | INTRAVENOUS | Status: AC
  Administered 2022-05-24: 10 mL

## 2022-05-24 MED ORDER — FAMOTIDINE IN NACL 20-0.9 MG/50ML-% IV SOLN
20.0000 mg | Freq: Once | INTRAVENOUS | Status: AC
  Administered 2022-05-24: 20 mg via INTRAVENOUS
  Filled 2022-05-24: qty 50

## 2022-05-24 MED ORDER — SODIUM CHLORIDE 0.9 % IV SOLN: 16.5000 mg/kg | Freq: Once | INTRAVENOUS | Status: DC

## 2022-05-24 MED ORDER — SODIUM CHLORIDE 0.9 % IV SOLN
16.5000 mg/kg | Freq: Once | INTRAVENOUS | Status: AC
  Administered 2022-05-24: 1400 mg via INTRAVENOUS
  Filled 2022-05-24: qty 60

## 2022-05-24 MED ORDER — CYANOCOBALAMIN 1000 MCG/ML IJ SOLN
1000.0000 ug | Freq: Once | INTRAMUSCULAR | Status: AC
  Administered 2022-05-24: 1000 ug via INTRAMUSCULAR
  Filled 2022-05-24: qty 1

## 2022-05-24 MED ORDER — SODIUM CHLORIDE 0.9% FLUSH
10.0000 mL | INTRAVENOUS | Status: DC | PRN
  Administered 2022-05-24: 10 mL

## 2022-05-24 MED ORDER — HEPARIN SOD (PORK) LOCK FLUSH 100 UNIT/ML IV SOLN
500.0000 [IU] | Freq: Once | INTRAVENOUS | Status: AC | PRN
  Administered 2022-05-24: 500 [IU]

## 2022-05-24 MED ORDER — DIPHENHYDRAMINE HCL 25 MG PO CAPS
50.0000 mg | ORAL_CAPSULE | Freq: Once | ORAL | Status: AC
  Administered 2022-05-24: 50 mg via ORAL
  Filled 2022-05-24: qty 2

## 2022-05-24 MED ORDER — SODIUM CHLORIDE 0.9 % IV SOLN: Freq: Once | INTRAVENOUS | Status: AC

## 2022-05-24 MED ORDER — ACETAMINOPHEN 325 MG PO TABS
650.0000 mg | ORAL_TABLET | Freq: Once | ORAL | Status: AC
  Administered 2022-05-24: 650 mg via ORAL
  Filled 2022-05-24: qty 2

## 2022-05-24 NOTE — Patient Instructions (Signed)
[facility] ONCOLOGY   Discharge Instructions: Thank you for choosing [facility] to provide your oncology and hematology care.   If you have a lab appointment with the [facility], please go directly to the [facility] and check in at the registration area.   Wear comfortable clothing and clothing appropriate for easy access to any Portacath or PICC line.   We strive to give you quality time with your provider. You may need to reschedule your appointment if you arrive late (15 or more minutes).  Arriving late affects you and other patients whose appointments are after yours.  Also, if you miss three or more appointments without notifying the office, you may be dismissed from the clinic at the provider's discretion.      For prescription refill requests, have your pharmacy contact our office and allow 72 hours for refills to be completed.    Today you received the following chemotherapy and/or immunotherapy agents: Daratumumab (Darzalex)       To help prevent nausea and vomiting after your treatment, we encourage you to take your nausea medication as directed.  BELOW ARE SYMPTOMS THAT SHOULD BE REPORTED IMMEDIATELY: *FEVER GREATER THAN 100.4 F (38 C) OR HIGHER *CHILLS OR SWEATING *NAUSEA AND VOMITING THAT IS NOT CONTROLLED WITH YOUR NAUSEA MEDICATION *UNUSUAL SHORTNESS OF BREATH *UNUSUAL BRUISING OR BLEEDING *URINARY PROBLEMS (pain or burning when urinating, or frequent urination) *BOWEL PROBLEMS (unusual diarrhea, constipation, pain near the anus) TENDERNESS IN MOUTH AND THROAT WITH OR WITHOUT PRESENCE OF ULCERS (sore throat, sores in mouth, or a toothache) UNUSUAL RASH, SWELLING OR PAIN  UNUSUAL VAGINAL DISCHARGE OR ITCHING   Items with * indicate a potential emergency and should be followed up as soon as possible or go to the Emergency Department if any problems should occur.  Please show the CHEMOTHERAPY ALERT CARD or IMMUNOTHERAPY ALERT CARD  at check-in to the Emergency Department and triage nurse.  Should you have questions after your visit or need to cancel or reschedule your appointment, please contact [facility]  Dept: [phone]  and follow the prompts.  Office hours are 8:00 a.m. to 4:30 p.m. Monday - Friday. Please note that voicemails left after 4:00 p.m. may not be returned until the following business day.  We are closed weekends and major holidays. You have access to a nurse at all times for urgent questions. Please call the main number to the clinic Dept: [phone] and follow the prompts.   For any non-urgent questions, you may also contact your provider using MyChart. We now offer e-Visits for anyone 22 and older to request care online for non-urgent symptoms. For details visit [email or web address].   Also download the MyChart app! Go to the app store, search "MyChart", open the app, select [facility], and log in with your MyChart username and password.  Masks are optional in the cancer centers. If you would like for your care team to wear a mask while they are taking care of you, please let them know. You may have one support person who is at least 78 years old accompany you for your appointments.

## 2022-05-29 LAB — MULTIPLE MYELOMA PANEL, SERUM
Albumin SerPl Elph-Mcnc: 3.6 g/dL (ref 2.9–4.4)
Albumin/Glob SerPl: 1.3 (ref 0.7–1.7)
Alpha 1: 0.2 g/dL (ref 0.0–0.4)
Alpha2 Glob SerPl Elph-Mcnc: 1.2 g/dL — ABNORMAL HIGH (ref 0.4–1.0)
B-Globulin SerPl Elph-Mcnc: 0.9 g/dL (ref 0.7–1.3)
Gamma Glob SerPl Elph-Mcnc: 0.4 g/dL (ref 0.4–1.8)
Globulin, Total: 2.8 g/dL (ref 2.2–3.9)
IgA: 76 mg/dL (ref 64–422)
IgG (Immunoglobin G), Serum: 444 mg/dL — ABNORMAL LOW (ref 586–1602)
IgM (Immunoglobulin M), Srm: 46 mg/dL (ref 26–217)
Total Protein ELP: 6.4 g/dL (ref 6.0–8.5)

## 2022-05-29 NOTE — Progress Notes (Signed)
Chronic Care Management Pharmacy Note  06/06/2022 Name:  Tracy [name] MRN:  [id number] DOB:  May 24, 1946  Summary: PharmD FU visit.  BP elevated previous visits.  LDL above goal for DM/CAD.  Patient has settled in and doing well.  Her neuropathy continues to bother her and she feels like it is getting worse.    Recommendations/Changes made from today's visit: Consider increase gabapentin DUE FOR - UACR labs to evaluate kidneys Consider increased statin to shoot for goal < 70 in DM/CAD  Plan: FU 3 months   Subjective: Tracy [name] is an 76 y.o. year old female who is a primary patient of [name], Aundra Millet, MD.  The CCM team was consulted for assistance with disease management and care coordination needs.    Engaged with patient by telephone for follow up visit in response to provider referral for pharmacy case management and/or care coordination services.   Consent to Services:  The patient was given the following information about Chronic Care Management services today, agreed to services, and gave verbal consent: 1. CCM service includes personalized support from designated clinical staff supervised by the primary care provider, including individualized plan of care and coordination with other care providers 2. 24/7 contact phone numbers for assistance for urgent and routine care needs. 3. Service will only be billed when office clinical staff spend 20 minutes or more in a month to coordinate care. 4. Only one practitioner may furnish and bill the service in a calendar month. 5.The patient may stop CCM services at any time (effective at the end of the month) by phone call to the office staff. 6. The patient will be responsible for cost sharing (co-pay) of up to 20% of the service fee (after annual deductible is met). Patient agreed to services and consent obtained.  Patient Care Team: [name], MD as PCP - General (Family Medicine) [name], MD as Attending Physician  (Physical Medicine and Rehabilitation) [name], MD as Consulting Physician (Orthopedic Surgery) [name], MD as Consulting Physician (Otolaryngology) [name], MD as Consulting Physician (Gastroenterology) [name], MD as Consulting Physician (General Surgery) Edythe Clarity, [facility] (Pharmacist)  Recent office visits:  None noted.    Recent consult visits:  None noted.    Hospital visits:  None in previous 6 months     Objective:  Lab Results  Component Value Date   CREATININE 1.28 (H) 05/24/2022   BUN 15 05/24/2022   GFR 47.97 (L) 09/03/2021   EGFR 71 (L) 07/13/2015   GFRNONAA 43 (L) 05/24/2022   GFRAA 41 (L) 04/21/2020   NA 142 05/24/2022   K 4.1 05/24/2022   CALCIUM 9.5 05/24/2022   CO2 25 05/24/2022   GLUCOSE 119 (H) 05/24/2022    Lab Results  Component Value Date/Time   HGBA1C 7.0 (H) 09/24/2021 10:36 AM   HGBA1C 7.1 (H) 04/10/2021 10:06 AM   GFR 47.97 (L) 09/03/2021 10:57 AM   GFR 33.25 (L) 04/10/2021 10:06 AM    Last diabetic Eye exam:  Lab Results  Component Value Date/Time   HMDIABEYEEXA No Retinopathy 05/20/2019 12:00 AM    Last diabetic Foot exam: No results found for: "HMDIABFOOTEX"   Lab Results  Component Value Date   CHOL 216 (H) 09/24/2021   HDL 89.00 09/24/2021   LDLCALC 99 09/24/2021   LDLDIRECT 154.7 04/05/2013   TRIG 142.0 09/24/2021   CHOLHDL 2 09/24/2021       Latest Ref Rng & Units 05/24/2022  9:25 AM 05/03/2022   11:20 AM 04/26/2022   10:31 AM  Hepatic Function  Total Protein 6.5 - 8.1 g/dL 6.7  6.7  6.5   Albumin 3.5 - 5.0 g/dL 4.1  4.2  4.0   AST 15 - 41 U/L _0 ALT 0 - 44 U/L _1 Alk Phosphatase 38 - 126 U/L 69  81  77   Total Bilirubin 0.3 - 1.2 mg/dL 0.3  0.4  0.3     Lab Results  Component Value Date/Time   TSH 0.92 09/03/2021 10:57 AM   TSH 2.23 04/10/2021 10:06 AM       Latest Ref Rng & Units 05/24/2022    9:25 AM 05/03/2022   11:20 AM 04/26/2022   10:31 AM  CBC   WBC 4.0 - 10.5 K/uL 5.0  5.3  4.8   Hemoglobin 12.0 - 15.0 g/dL 10.5  10.6  10.6   Hematocrit 36.0 - 46.0 % 30.7  30.8  30.5   Platelets 150 - 400 K/uL 298  294  271     Lab Results  Component Value Date/Time   VD25OH 74.79 09/24/2021 10:36 AM   VD25OH 71.18 04/10/2021 10:06 AM    Clinical ASCVD: Yes  The ASCVD Risk score (Arnett DK, et al., 2019) failed to calculate for the following reasons:   The patient has a prior MI or stroke diagnosis       12/20/2021    2:42 PM 09/24/2021    9:55 AM 09/03/2021   10:02 AM  Depression screen PHQ 2/9  Decreased Interest 0 0 0  Down, Depressed, Hopeless 0 0 0  PHQ - 2 Score 0 0 0  Altered sleeping  1 3  Tired, decreased energy  0 0  Change in appetite  0 0  Feeling bad or failure about yourself   0 0  Trouble concentrating  2 0  Moving slowly or fidgety/restless  0 0  Suicidal thoughts  0 0  PHQ-9 Score  3 3  Difficult doing work/chores  Not difficult at all Extremely dIfficult      Social History   Tobacco Use  Smoking Status Former   Packs/day: 1.00   Years: 20.00   Total pack years: 20.00   Types: Cigarettes   Quit date: 07/30/2011   Years since quitting: 10.8  Smokeless Tobacco Never  Tobacco Comments   Quit >4 years ago; 1 ppd for about 5/20 years (remaining was less)   BP Readings from Last 3 Encounters:  05/24/22 (!) 147/64  05/24/22 (!) 140/62  05/03/22 134/74   Pulse Readings from Last 3 Encounters:  05/24/22 91  05/24/22 65  05/03/22 90   Wt Readings from Last 3 Encounters:  05/24/22 183 lb 9.6 oz (83.3 kg)  05/03/22 181 lb (82.1 kg)  04/26/22 183 lb (83 kg)   BMI Readings from Last 3 Encounters:  05/24/22 37.08 kg/m  05/03/22 36.56 kg/m  04/26/22 36.96 kg/m    Assessment/Interventions: Review of patient past medical history, allergies, medications, health status, including review of consultants reports, laboratory and other test data, was performed as part of comprehensive evaluation and provision  of chronic care management services.   SDOH:  (Social Determinants of Health) assessments and interventions performed: No Financial Resource Strain: High Risk (12/21/2021)   Overall Financial Resource Strain (CARDIA)    Difficulty of Paying Living Expenses: Hard   Food Insecurity: Food Insecurity Present (12/21/2021)   Hunger  Vital Sign    Worried About Charity fundraiser in the Last Year: Often true    Ran Out of Food in the Last Year: Often true    SDOH Interventions    Flowsheet Row Telephone from 05/14/2022 in [place] Telephone from 12/21/2021 in [place] from 12/20/2021 in [place] Office Visit from 05/04/2021 in [place] Management from 05/12/2020 in [place] Management from 11/24/2019 in [place]  SDOH Interventions        Food Insecurity Interventions -- -- Intervention Not Indicated -- Other (Comment) Other (Comment)  Housing Interventions Other (Comment)  [referred to B[facility]] -- Intervention Not Indicated -- -- --  Transportation Interventions Intervention Not Indicated -- Intervention Not Indicated -- -- Other (Comment)  Utilities Interventions Intervention Not Indicated -- -- -- -- --  Depression Interventions/Treatment  -- -- -- Medication -- --  Financial Strain Interventions -- Other (Comment) [id number] Referral -- -- --  Physical Activity Interventions -- -- Other (Comments)  [Encouraged chair exercises.] -- -- --  Stress Interventions -- -- Intervention Not Indicated -- -- --  Social Connections Interventions -- -- Intervention Not Indicated -- -- --      SDOH Screenings   Food Insecurity: Food Insecurity Present (12/21/2021)  Housing: Medium Risk (05/14/2022)  Transportation  Needs: No Transportation Needs (05/14/2022)  Utilities: Not At Risk (05/14/2022)  Alcohol Screen: Low Risk  (12/20/2021)  Depression (PHQ2-9): Low Risk  (12/20/2021)  Financial Resource Strain: High Risk (12/21/2021)  Physical Activity: Inactive (12/20/2021)  Social Connections: Moderately Integrated (12/20/2021)  Stress: No Stress Concern Present (12/20/2021)  Tobacco Use: Medium Risk (05/14/2022)    CCM Care Plan  Allergies  Allergen Reactions   Bacitracin-Neomycin-Polymyxin  [Neomycin-Bacitracin Zn-Polymyx] Swelling   Nsaids Other (See Comments)    nosebleeds   Tape Hives    Pt cannot tolerate bandaids, tape, or any other adhesives.    Ambien [Zolpidem Tartrate]     Hives    Amoxicillin     Rash    Clavulanic Acid Hives   Contrast Media [Iodinated Contrast Media] Hives    Pt states hives with prior ct, was given benadryl to resolve   Latex Swelling   Prednisone Swelling    Pt tolerates Dexamethasone. Throat swelling   Tessalon [Benzonatate] Itching and Rash    Medications Reviewed Today     Reviewed by Edythe Clarity, [facility] (Pharmacist) on 06/06/22 at [place] List Status: <None>   Medication Order Taking? Sig Documenting Provider Last Dose Status Informant  acyclovir (ZOVIRAX) 400 MG tablet [id number] Yes Take 1 tablet (400 mg total) by mouth 2 (two) times daily. [name], MD Taking Active   atorvastatin (LIPITOR) 10 MG tablet [id number] Yes TAKE 1 TABLET BY MOUTH DAILY [name], MD Taking Active   azelastine (ASTELIN) 0.1 % nasal spray [id number] Yes Place 1 spray into both nostrils 2 (two) times daily. Use in each nostril as directed [name], NP Taking Active   cyclobenzaprine (FLEXERIL) 5 MG tablet [id number] Yes Take 1 tablet (5 mg total) by mouth 2 (two) times daily as needed for muscle spasms. [name], NP Taking Active   gabapentin (NEURONTIN) 100 MG capsule [id number] Yes TAKE 2 CAPSULES BY MOUTH 3 TIMES DAILY [name],  MD Taking Active   glucose blood (ACCU-CHEK GUIDE) test strip [id number]  Yes Use as instructed to check sugars 1-2 times daily. [name], MD Taking Active   hydrocortisone 2.5 % cream [id number] Yes Apply topically 2 (two) times daily. [name], MD Taking Active   loratadine (CLARITIN) 10 MG tablet [id number] Yes Take 10 mg by mouth daily. [provider] Taking Active Self  meclizine (ANTIVERT) 25 MG tablet [id number] Yes Take 1 tablet (25 mg total) by mouth 3 (three) times daily as needed for dizziness. Gardenia Phlegm, NP Taking Active Self           Med Note [facility] Karen Kays   Wed Aug 22, 2021 11:48 AM) PRN  oxymetazoline (AFRIN) 0.05 % nasal spray [id number] Yes Place 1 spray into both nostrils 2 (two) times daily as needed for congestion. Or bloody nose. [name], NP Taking Active   pantoprazole (PROTONIX) 40 MG tablet [id number] Yes TAKE 1 TABLET BY MOUTH TWICE  DAILY [name], MD Taking Active   tiZANidine (ZANAFLEX) 4 MG tablet [id number] Yes TAKE 1 TABLET BY MOUTH AT  BEDTIME [name], MD Taking Active   traZODone (DESYREL) 100 MG tablet [id number] Yes TAKE 1 TABLET BY MOUTH AT  BEDTIME [name], MD Taking Active   triamcinolone cream (KENALOG) 0.1 % [id number] Yes Apply 1 application topically 2 (two) times daily. Tacy Learn, PA-C Taking Active   Vitamin D, Ergocalciferol, (DRISDOL) 1.25 MG (50000 UNIT) CAPS capsule [id number] Yes TAKE 1 CAPSULE BY MOUTH ONCE  WEEKLY [name], MD Taking Active             Patient Active Problem List   Diagnosis Date Noted   Malignant neoplasm metastatic to bone [facility]) 02/08/2021   Angiodysplasia of intestine 08/23/2020   Port-A-Cath in place 08/04/2020   Counseling regarding advance care planning and goals of care 02/07/2020   Multiple myeloma ([place]) 01/25/2020   Dizziness 10/12/2019   Post-nasal drainage 10/12/2019   Allergic rhinitis 08/19/2018    Type 2 diabetes mellitus with diabetic neuropathy, unspecified ([place]) 11/05/2017   Encounter for long-term use of muscle relaxants 09/24/2017   CAD in native artery 09/24/2017   OSA (obstructive sleep apnea) 09/24/2017   S[place]ings 09/24/2017   Asthenia 06/30/2017   Morbid obesity ([place]) 06/02/2017   Depression 06/02/2017   Iron deficiency anemia 09/23/2016   Anemia of chronic disease 09/28/2015   Genetic testing 08/21/2015   History of left breast cancer 07/13/2015   History of right breast cancer 06/20/2015   Hearing loss due to cerumen impaction 12/29/2014   Allergy to adhesive tape 05/19/2014   Hyperlipidemia 12/06/2013   GERD (gastroesophageal reflux disease) 12/06/2013   Cervical disc disease 11/11/2013   Osteopenia 05/25/2013   Allergic asthma 12/24/2012   Insomnia 04/02/2012   Physical exam, annual 04/02/2012   HTN (hypertension) 02/03/2012   Vertigo, benign positional 02/03/2012   Left groin pain 02/03/2012   Hip pain 10/10/2011   TIA (transient ischemic attack) 07/24/2011   Allergic reaction 07/05/2011   Osteoarthrosis involving lower leg 10/19/2010   Disorder of bone and cartilage 05/01/2010   Generalized anxiety disorder 05/01/2010   Vitamin D deficiency 02/20/2010   Unspecified chronic bronchitis ([place]) 08/24/2009   Other lymphedema 10/29/2007    Immunization History  Administered Date(s) Administered   Fluad Quad(high Dose 65+) 05/31/2020   Influenza Split 04/28/2010, 06/24/2011   Influenza Whole 04/22/2013   Influenza, High Dose Seasonal PF 05/30/2016, 05/20/2018, 04/09/2019   Influenza,inj,Quad PF,6+ Mos 04/29/2015, 05/16/2017   Influenza-Unspecified 05/03/2014  Moderna Sars-Covid-2 Vaccination 09/27/2019, 10/25/2019, 06/05/2020   Pneumococcal Conjugate-13 06/01/2015   Pneumococcal Polysaccharide-23 05/10/2014   Td 04/05/2013   Zoster, Live 05/01/2010    Conditions to be addressed/monitored:  HTN, DM, HLD, CAD  Care Plan : General Pharmacy (Adult)   Updates made by Edythe Clarity, RPH since 06/06/2022 12:00 AM     Problem: HTN, HLD, DM   Priority: High  Onset Date: 06/06/2022     Long-Range Goal: Patient Stated   This Visit's Progress: On track  Priority: High  Note:   Current Barriers:  Neuropathy Borderline A1c, elevated LDL  Pharmacist Clinical Goal(s):  Patient will achieve improvement in LDL as evidenced by labs through collaboration with PharmD and provider.   Interventions: 1:1 collaboration with [name], MD regarding development and update of comprehensive plan of care as evidenced by provider attestation and co-signature Inter-disciplinary care team collaboration (see longitudinal plan of care) Comprehensive medication review performed; medication list updated in electronic medical record  Hypertension (BP goal <130/80) -Uncontrolled -Current treatment: None at this time -Medications previously tried: none noted  -Current home readings: not checking much at home -Denies hypotensive/hypertensive symptoms -Educated on BP goals and benefits of medications for prevention of heart attack, stroke and kidney damage; Daily salt intake goal < 2300 mg; Exercise goal of 150 minutes per week; Importance of home blood pressure monitoring; -Counseled to monitor BP at home a few times per week, document, and provide log at future appointments -Last few office Bps have been elevated.  Patient also due for UACR for her DM.   Have asked her to schedule a visit with PCP - recheck BP in office.  If elevated could consider ACE/ARB especially if indicated with UACR.  Hyperlipidemia/CAD: (LDL goal < 70) -Uncontrolled -Current treatment: Atorvastatin 42m Appropriate, Query effective,  -Medications previously tried: none noted  -Current dietary patterns: same see previous -Current exercise habits: minimal -Educated on Cholesterol goals;  Benefits of statin for ASCVD risk reduction; Importance of limiting foods  high in cholesterol; LDL not at goal for CAD and DM. -Recommend recheck lipids, if elevated would consider increase to Atorvastatin 243m  Diabetes (A1c goal <7%) -Controlled -Current medications: None - diet controlled -Medications previously tried: none ntoed  -Current home glucose readings fasting glucose: not checking post prandial glucose: not checking -Denies hypoglycemic/hyperglycemic symptoms -Educated on A1c and blood sugar goals; Complications of diabetes including kidney damage, retinal damage, and cardiovascular disease; Counseled to check feet daily and get yearly eye exams -Counseled to check feet daily and get yearly eye exams -Recommended continue current management. -She is due for kidney eval to evaluate for microalbuminuria.  Have asked her to schedule visit with PCP - we will also check A1c at this time.  Patient Goals/Self-Care Activities Patient will:  - take medications as prescribed as evidenced by patient report and record review Schedule visit with PCP for updated labs, etc.  Follow Up Plan: The care management team will reach out to the patient again over the next 90 days.       Medication Assistance: None required.  Patient affirms current coverage meets needs.  Compliance/Adherence/Medication fill history: Care Gaps: Kidney eval A1c  - Will get these at next PCP visit   Star[facility] Drugs: Atorvastatin 1047m0/27/23 90ds  Patient's preferred pharmacy is:  [address] [place] [place]: [phone]: [phone]  [facility] ([place], [place] [facility] [place] [address] [address] Phone: [phone] Fax: [phone]  [facility] [phone] @ [place], [place]  [address] Phone: [phone] Fax: [phone]  [place], [address] [place] [address] Phone: [phone] Fax: [phone]  Uses pill box? Yes Pt endorses 100% compliance  We discussed: Benefits of medication synchronization, packaging and delivery as well as enhanced pharmacist oversight with [employer]. Patient decided to: Continue current medication management strategy  Care Plan and Follow Up Patient Decision:  Patient agrees to Care Plan and Follow-up.  Plan: The care management team will reach out to the patient again over the next 90 days.  Beverly Milch, PharmD, [name] Clinical Pharmacist Practitioner [place] [phone]

## 2022-05-30 ENCOUNTER — Encounter: Payer: Self-pay | Admitting: Hematology

## 2022-05-30 NOTE — Progress Notes (Signed)
[name] Follow up:   DOS .05/24/2022  Tracy Minium, MD [address] [place] [address]   CC: Follow-up for continued evaluation and management of multiple myeloma SUMMARY OF ONCOLOGIC HISTORY: Oncology History  History of right breast cancer  04/16/2000 Surgery   Left breast: Triple negative  invasive ductal cancer treated with lumpectomy, adjuvant chemotherapy, radiation , in [place], unknown stage   06/07/2015 Mammogram   Right breast mass 6x 6 x 5 mm, right axillary lymph node with slight cortex thickening measured 5 mm    06/13/2015 Initial Diagnosis   Right breast needle biopsy: Invasive ductal carcinoma, grade 1, right axillary lymph node biopsy negative , ER 95%, PR 5%, Ki-67 10%, HER-2 negative   06/13/2015 Clinical Stage   Stage IA: T1b N0   07/07/2015 Surgery   Right lumpectomy: Invasive ductal carcinoma grade 1, 1 cm span, with low-grade DCIS, DCIS focally 0.1 cm to inferior margin, 0/3 lymph nodes negative, ER 95%, PR 5%, HER-2 negative ratio 1.1, Ki-67 10%   07/07/2015 Pathologic Stage   Stage IA: T1c N0   07/13/2015 Procedure   Breast High/Moderate Risk Panel reveals no clinically significant variant at ATM, BRCA1, BRCA2, CDH1, CHEK2, PALB2, PTEN, and TP53.     08/23/2015 - 09/21/2015 Radiation Therapy   Adjuvant Radiation: Right breast/ 42.5Gy at 2.5 Gy per fraction x 17 fractions.   Right breast boost/ 7.5 Gy at 2.5 Gy per fraction x 3 fractions    Anti-estrogen oral therapy   Patient refused antiestrogen therapy   10/20/2015 Survivorship   Survivorship care plan completed and mailed to patient in lieu of in person visit at her request   Iron deficiency anemia  Multiple myeloma ([place])  01/25/2020 Initial Diagnosis   Multiple myeloma ([place])   02/11/2020 - 05/19/2020 Adjuvant Chemotherapy   Daratumumab Weekly    02/29/2020 - 03/13/2020 Radiation Therapy   The targets were treated to a total dose of 20 Gy in 10 fractions  of 2 Gy each to the left hip and L5 using one plan.   06/02/2020 - 10/05/2020 Adjuvant Chemotherapy   Daratumumab and Carfilzomib   10/19/2020 -  Adjuvant Chemotherapy   Maintenance Daratumumab--initially every 2 weeks, then every 4 weeks.    05/24/2022 -  Chemotherapy   Patient is on Treatment Plan : MYELOMA Daratumumab IV q28d     Malignant neoplasm metastatic to bone ([place])  02/08/2021 Initial Diagnosis   Bone metastases ([place])   05/24/2022 -  Chemotherapy   Patient is on Treatment Plan : MYELOMA Daratumumab IV q28d       CURRENT THERAPY: Daratumumab/B12 injection   INTERVAL HISTORY:  Tracy [name] is here for continued evaluation and management of multiple myeloma. She notes no acute new symptoms since her last clinic visit. No fevers no chills no night sweats no unexpected weight loss.  No new bone pains. No new treatment related toxicities. Labs done today were discussed in detail with the patient.   Patient Active Problem List   Diagnosis Date Noted   Malignant neoplasm metastatic to bone ([place]) 02/08/2021   Angiodysplasia of intestine 08/23/2020   Port-A-Cath in place 08/04/2020   Counseling regarding advance care planning and goals of care 02/07/2020   Multiple myeloma ([place]) 01/25/2020   Dizziness 10/12/2019   Post-nasal drainage 10/12/2019   Allergic rhinitis 08/19/2018   Type 2 diabetes mellitus with diabetic neuropathy, unspecified ([place]) 11/05/2017   Encounter for long-term use of muscle  relaxants 09/24/2017   CAD in native artery 09/24/2017   OSA (obstructive sleep apnea) 09/24/2017   [place] 09/24/2017   Asthenia 06/30/2017   Morbid obesity ([place]) 06/02/2017   Depression 06/02/2017   Iron deficiency anemia 09/23/2016   Anemia of chronic disease 09/28/2015   Genetic testing 08/21/2015   History of left breast cancer 07/13/2015   History of right breast cancer 06/20/2015   Hearing loss due to cerumen impaction 12/29/2014   Allergy to adhesive tape  05/19/2014   Hyperlipidemia 12/06/2013   GERD (gastroesophageal reflux disease) 12/06/2013   Cervical disc disease 11/11/2013   Osteopenia 05/25/2013   Allergic asthma 12/24/2012   Insomnia 04/02/2012   Physical exam, annual 04/02/2012   HTN (hypertension) 02/03/2012   Vertigo, benign positional 02/03/2012   Left groin pain 02/03/2012   Hip pain 10/10/2011   TIA (transient ischemic attack) 07/24/2011   Allergic reaction 07/05/2011   Osteoarthrosis involving lower leg 10/19/2010   Disorder of bone and cartilage 05/01/2010   Generalized anxiety disorder 05/01/2010   Vitamin D deficiency 02/20/2010   Unspecified chronic bronchitis ([place]) 08/24/2009   Other lymphedema 10/29/2007    is allergic to bacitracin-neomycin-polymyxin  [neomycin-bacitracin zn-polymyx], nsaids, tape, ambien [zolpidem tartrate], amoxicillin, clavulanic acid, contrast media [iodinated contrast media], latex, prednisone, and tessalon [benzonatate].  MEDICAL HISTORY: Past Medical History:  Diagnosis Date   Angiodysplasia of intestine 08/23/2020   Anxiety    Arthritis    Breast cancer ([facility]) 06/13/15   Cancer ([place]) 2000   breast cancer   Chronic bronchitis ([place])    Chronic bronchitis ([place])    Hyperlipidemia    Hypertension    Myocardial infarction [facility]) 2001   Personal history of radiation therapy    Restless leg    Stroke ([place]) 2004   TIA, no deficits    SURGICAL HISTORY: Past Surgical History:  Procedure Laterality Date   ABDOMINAL HYSTERECTOMY  1985   BREAST LUMPECTOMY Left 2000   radiation and chemo   BREAST LUMPECTOMY Right 2016   radiation   BREAST SURGERY  2001   lt breast lumpectomy   COLONOSCOPY WITH ESOPHAGOGASTRODUODENOSCOPY (EGD)  04/2020   ENTEROSCOPY N/A 03/15/2021   Procedure: ENTEROSCOPY;  Surgeon: Milus Banister, MD;  Location: [facility];  Service: Endoscopy;  Laterality: N/A;   GIVENS CAPSULE STUDY  07/2020   HOT HEMOSTASIS N/A 03/15/2021   Procedure: HOT HEMOSTASIS (ARGON  PLASMA COAGULATION/BICAP);  Surgeon: Milus Banister, MD;  Location: [facility];  Service: Endoscopy;  Laterality: N/A;   IR IMAGING GUIDED PORT INSERTION  04/25/2020   RADIOACTIVE SEED GUIDED PARTIAL MASTECTOMY WITH AXILLARY SENTINEL LYMPH NODE BIOPSY Right 07/07/2015   Procedure: RIGHT RADIOACTIVE SEED GUIDED PARTIAL MASTECTOMY WITH AXILLARY SENTINEL LYMPH NODE BIOPSY;  Surgeon: Autumn Messing III, MD;  Location: [facility];  Service: General;  Laterality: Right;   SMALL INTESTINE SURGERY     TUBAL LIGATION     UPPER GASTROINTESTINAL ENDOSCOPY      SOCIAL HISTORY: Social History   Socioeconomic History   Marital status: Divorced    Spouse name: Not on file   Number of children: 7   Years of education: Not on file   Highest education level: Not on file  Occupational History   Occupation: retired  Tobacco Use   Smoking status: Former    Packs/day: 1.00    Years: 20.00    Total pack years: 20.00    Types: Cigarettes    Quit date: 07/30/2011    Years since  quitting: 10.8   Smokeless tobacco: Never   Tobacco comments:    Quit >4 years ago; 1 ppd for about 5/20 years (remaining was less)  Vaping Use   Vaping Use: Former  Substance and Sexual Activity   Alcohol use: No    Alcohol/week: 0.0 standard drinks of alcohol   Drug use: No   Sexual activity: Not Currently  Other Topics Concern   Not on file  Social History Narrative   Lives alone.  Retired.  Education:  11th grade GED.  Children:  7 (one here).    Social Determinants of Health   Financial Resource Strain: High Risk (12/21/2021)   Overall Financial Resource Strain (CARDIA)    Difficulty of Paying Living Expenses: Hard  Food Insecurity: Food Insecurity Present (12/21/2021)   Hunger Vital Sign    Worried About Running Out of Food in the Last Year: Often true    Ran Out of Food in the Last Year: Often true  Transportation Needs: No Transportation Needs (05/14/2022)   PRAPARE - Radiographer, therapeutic (Medical): No    Lack of Transportation (Non-Medical): No  Physical Activity: Inactive (12/20/2021)   Exercise Vital Sign    Days of Exercise per Week: 0 days    Minutes of Exercise per Session: 0 min  Stress: No Stress Concern Present (12/20/2021)   [place]    Feeling of Stress : Not at all  Social Connections: Moderately Integrated (12/20/2021)   Social Connection and Isolation Panel [NHANES]    Frequency of Communication with Friends and Family: More than three times a week    Frequency of Social Gatherings with Friends and Family: More than three times a week    Attends Religious Services: More than 4 times per year    Active Member of [employer] or Organizations: Yes    Attends Music therapist: More than 4 times per year    Marital Status: Divorced  Intimate Partner Violence: Not At Risk (12/20/2021)   Humiliation, Afraid, Rape, and Kick questionnaire    Fear of Current or Ex-Partner: No    Emotionally Abused: No    Physically Abused: No    Sexually Abused: No    FAMILY HISTORY: Family History  Problem Relation Age of Onset   Emphysema Mother 91       smoker   Diabetes Father    Lung cancer Sister        dx. <50; former smoker   Diabetes Brother    Diabetes Brother    Brain cancer Brother 55       unknown tumor type   Diabetes Paternal Aunt    Stroke Maternal Grandmother    Diabetes Paternal Grandmother    Cancer Daughter 75       neck cancer   Other Daughter        hysterectomy for unspecified reason   Colon cancer Daughter    Breast cancer Cousin    Cancer Cousin        unspecified type   Breast cancer Other        triple negative breast cancer in her 94s   Colon polyps Neg Hx    Esophageal cancer Neg Hx    Gallbladder disease Neg Hx    ROS 10 Point review of Systems was done is negative except as noted above.  PHYSICAL EXAMINATION  ECOG PERFORMANCE STATUS: 1 -  Symptomatic but completely ambulatory  Vitals:  05/24/22 0942  BP: (!) 140/62  Pulse: 65  Resp: 17  Temp: 97.8 F (36.6 C)  SpO2: 98%  NAD GENERAL:alert, in no acute distress and comfortable SKIN: no acute rashes, no significant lesions EYES: conjunctiva are pink and non-injected, sclera anicteric OROPHARYNX: MMM, no exudates, no oropharyngeal erythema or ulceration NECK: supple, no JVD LYMPH:  no palpable lymphadenopathy in the cervical, axillary or inguinal regions LUNGS: clear to auscultation b/l with normal respiratory effort HEART: regular rate & rhythm ABDOMEN:  normoactive bowel sounds , non tender, not distended. Extremity: no pedal edema PSYCH: alert & oriented x 3 with fluent speech NEURO: no focal motor/sensory deficits   LABORATORY DATA: .    Latest Ref Rng & Units 05/24/2022    9:25 AM 05/03/2022   11:20 AM 04/26/2022   10:31 AM  CBC  WBC 4.0 - 10.5 K/uL 5.0  5.3  4.8   Hemoglobin 12.0 - 15.0 g/dL 10.5  10.6  10.6   Hematocrit 36.0 - 46.0 % 30.7  30.8  30.5   Platelets 150 - 400 K/uL 298  294  271    .    Latest Ref Rng & Units 05/24/2022    9:25 AM 05/03/2022   11:20 AM 04/26/2022   10:31 AM  CMP  Glucose 70 - 99 mg/dL 119  116  119   BUN 8 - 23 mg/dL _0 Creatinine 0.44 - 1.00 mg/dL 1.28  1.23  1.15   Sodium 135 - 145 mmol/L 142  142  140   Potassium 3.5 - 5.1 mmol/L 4.1  3.9  4.1   Chloride 98 - 111 mmol/L 108  108  107   CO2 22 - 32 mmol/L _1 Calcium 8.9 - 10.3 mg/dL 9.5  8.7  8.9   Total Protein 6.5 - 8.1 g/dL 6.7  6.7  6.5   Total Bilirubin 0.3 - 1.2 mg/dL 0.3  0.4  0.3   Alkaline Phos 38 - 126 U/L 69  81  77   AST 15 - 41 U/L _2 ALT 0 - 44 U/L _3 ASSESSMENT and THERAPY PLAN:   76 yo here for follow-up of her for follow-up of her multiple myeloma   1)  IgG Kappa Multiple myeloma with bone lesions, anemia, renal insuff. M spike @ 3.7g/dl on diagnosis. 1p deletion, polymorphic variant, 13q  deletion Multiple myeloma panel from 06/28/2021 with no M spike.  IFE positive for IgG kappa monoclonal protein possibly from her daratumumab. Currently on monthly daratumumab 2) h/o DM2 3) Diabetic Neuropathy 4) CKD - likely from DM2, but could have an element of myeloma kidney. 5) h/o TIA and AMI 6) Iron deficiency 7) B12 deficiency   PLAN: Patient's labs done today were discussed in detail with the patient CBC and CMP stable Myeloma panel shows no M spike noted Patient has no notable toxicities from the patient's daratumumab at this time and we shall continue this monthly. [name] discussed potentially switching to [facility] daratumumab but the patient prefers to continue her IV daratumumab. Continue maintenance Aredia every 8 weeks Continue B12 1000 mcg subcu every 4 weeks Patient has no contraindications to continuing her daratumumab every months  Follow-up Scheduling ongoing treatments as per integrated scheduling  The total time spent in the appointment was 22 minutes*.  All of the patient's questions were answered with apparent satisfaction. The patient knows  to call the clinic with any problems, questions or concerns.   [name] MD MS AAHIVMS [facility] Hematology/Oncology Physician [facility]  .*Total Encounter Time as defined by the Centers for Medicare and Medicaid Services includes, in addition to the face-to-face time of a patient visit (documented in the note above) non-face-to-face time: obtaining and reviewing outside history, ordering and reviewing medications, tests or procedures, care coordination (communications with other health care professionals or caregivers) and documentation in the medical record.

## 2022-06-04 ENCOUNTER — Other Ambulatory Visit: Payer: Self-pay | Admitting: Family Medicine

## 2022-06-05 ENCOUNTER — Ambulatory Visit: Payer: Medicare Other | Admitting: Pharmacist

## 2022-06-05 DIAGNOSIS — I10 Essential (primary) hypertension: Secondary | ICD-10-CM

## 2022-06-05 DIAGNOSIS — E785 Hyperlipidemia, unspecified: Secondary | ICD-10-CM

## 2022-06-05 DIAGNOSIS — E1140 Type 2 diabetes mellitus with diabetic neuropathy, unspecified: Secondary | ICD-10-CM

## 2022-06-06 NOTE — Patient Instructions (Addendum)
Visit Information   Goals Addressed             This Visit's Progress    LDL Cholesterol <70   Not on track    [place] (see longitudinal plan of care for additional care plan information)  Current Barriers:  Current antihyperlipidemic regimen: atorvastatin 10 mg daily     Component Value Date/Time   CHOL 147 08/20/2019 1044   TRIG 141.0 08/20/2019 1044   HDL 53.70 08/20/2019 1044   CHOLHDL 3 08/20/2019 1044   VLDL 28.2 08/20/2019 1044   LDLCALC 65 08/20/2019 1044   LDLDIRECT 154.7 04/05/2013 0906   ASCVD risk enhancing conditions: age >51, DM, HTN, CKD, CHF, current smoker 10-year ASCVD risk score: 22.7%  Pharmacist Clinical Goal(s):  Over the next 90 days, patient will work with PharmD and providers towards optimized antihyperlipidemic therapy  Interventions: Comprehensive medication review performed; medication list updated in electronic medical record.  Inter-disciplinary care team collaboration (see longitudinal plan of care)  Patient Self Care Activities:  Patient will focus on medication adherence by continuing current medication management  Initial goal documentation        Patient Care Plan: General Pharmacy (Adult)     Problem Identified: HTN, HLD, DM   Priority: High  Onset Date: 06/06/2022     Long-Range Goal: Patient Stated   This Visit's Progress: On track  Priority: High  Note:   Current Barriers:  Neuropathy Borderline A1c, elevated LDL  Pharmacist Clinical Goal(s):  Patient will achieve improvement in LDL as evidenced by labs through collaboration with PharmD and provider.   Interventions: 1:1 collaboration with [name], MD regarding development and update of comprehensive plan of care as evidenced by provider attestation and co-signature Inter-disciplinary care team collaboration (see longitudinal plan of care) Comprehensive medication review performed; medication list updated in electronic medical  record  Hypertension (BP goal <130/80) -Uncontrolled -Current treatment: None at this time -Medications previously tried: none noted  -Current home readings: not checking much at home -Denies hypotensive/hypertensive symptoms -Educated on BP goals and benefits of medications for prevention of heart attack, stroke and kidney damage; Daily salt intake goal < 2300 mg; Exercise goal of 150 minutes per week; Importance of home blood pressure monitoring; -Counseled to monitor BP at home a few times per week, document, and provide log at future appointments -Last few office Bps have been elevated.  Patient also due for UACR for her DM.   Have asked her to schedule a visit with PCP - recheck BP in office.  If elevated could consider ACE/ARB especially if indicated with UACR.  Hyperlipidemia/CAD: (LDL goal < 70) -Uncontrolled -Current treatment: Atorvastatin '10mg'$  Appropriate, Query effective,  -Medications previously tried: none noted  -Current dietary patterns: same see previous -Current exercise habits: minimal -Educated on Cholesterol goals;  Benefits of statin for ASCVD risk reduction; Importance of limiting foods high in cholesterol; LDL not at goal for CAD and DM. -Recommend recheck lipids, if elevated would consider increase to Atorvastatin '20mg'$ .  Diabetes (A1c goal <7%) -Controlled -Current medications: None - diet controlled -Medications previously tried: none ntoed  -Current home glucose readings fasting glucose: not checking post prandial glucose: not checking -Denies hypoglycemic/hyperglycemic symptoms -Educated on A1c and blood sugar goals; Complications of diabetes including kidney damage, retinal damage, and cardiovascular disease; Counseled to check feet daily and get yearly eye exams -Counseled to check feet daily and get yearly eye exams -Recommended continue current management. -She is due for kidney eval to evaluate  for microalbuminuria.  Have asked her to  schedule visit with PCP - we will also check A1c at this time.  Patient Goals/Self-Care Activities Patient will:  - take medications as prescribed as evidenced by patient report and record review Schedule visit with PCP for updated labs, etc.  Follow Up Plan: The care management team will reach out to the patient again over the next 90 days.       The patient verbalized understanding of instructions, educational materials, and care plan provided today and DECLINED offer to receive copy of patient instructions, educational materials, and care plan.  Telephone follow up appointment with pharmacy team member scheduled for: 3 months  Edythe Clarity, [place], PharmD Clinical Pharmacist  [facility] [phone]

## 2022-06-12 ENCOUNTER — Ambulatory Visit (INDEPENDENT_AMBULATORY_CARE_PROVIDER_SITE_OTHER): Payer: Medicare Other | Admitting: Family Medicine

## 2022-06-12 ENCOUNTER — Encounter: Payer: Self-pay | Admitting: Family Medicine

## 2022-06-12 VITALS — BP 132/84 | HR 87 | Temp 97.8°F | Resp 17 | Ht 59.0 in | Wt 185.2 lb

## 2022-06-12 DIAGNOSIS — E1140 Type 2 diabetes mellitus with diabetic neuropathy, unspecified: Secondary | ICD-10-CM | POA: Diagnosis not present

## 2022-06-12 DIAGNOSIS — E785 Hyperlipidemia, unspecified: Secondary | ICD-10-CM

## 2022-06-12 DIAGNOSIS — G2581 Restless legs syndrome: Secondary | ICD-10-CM

## 2022-06-12 LAB — HEPATIC FUNCTION PANEL
ALT: 11 U/L (ref 0–35)
AST: 15 U/L (ref 0–37)
Albumin: 4.3 g/dL (ref 3.5–5.2)
Alkaline Phosphatase: 69 U/L (ref 39–117)
Bilirubin, Direct: 0.1 mg/dL (ref 0.0–0.3)
Total Bilirubin: 0.2 mg/dL (ref 0.2–1.2)
Total Protein: 6.7 g/dL (ref 6.0–8.3)

## 2022-06-12 LAB — CBC WITH DIFFERENTIAL/PLATELET
Basophils Absolute: 0 10*3/uL (ref 0.0–0.1)
Basophils Relative: 0.5 % (ref 0.0–3.0)
Eosinophils Absolute: 0 10*3/uL (ref 0.0–0.7)
Eosinophils Relative: 0.4 % (ref 0.0–5.0)
HCT: 31.3 % — ABNORMAL LOW (ref 36.0–46.0)
Hemoglobin: 10.7 g/dL — ABNORMAL LOW (ref 12.0–15.0)
Lymphocytes Relative: 36.7 % (ref 12.0–46.0)
Lymphs Abs: 2.3 10*3/uL (ref 0.7–4.0)
MCHC: 34.3 g/dL (ref 30.0–36.0)
MCV: 94.1 fl (ref 78.0–100.0)
Monocytes Absolute: 0.4 10*3/uL (ref 0.1–1.0)
Monocytes Relative: 6.7 % (ref 3.0–12.0)
Neutro Abs: 3.4 10*3/uL (ref 1.4–7.7)
Neutrophils Relative %: 55.7 % (ref 43.0–77.0)
Platelets: 257 10*3/uL (ref 150.0–400.0)
RBC: 3.33 Mil/uL — ABNORMAL LOW (ref 3.87–5.11)
RDW: 13.6 % (ref 11.5–15.5)
WBC: 6.1 10*3/uL (ref 4.0–10.5)

## 2022-06-12 LAB — LIPID PANEL
Cholesterol: 179 mg/dL (ref 0–200)
HDL: 66.2 mg/dL (ref >39.00)
LDL Cholesterol: 83 mg/dL (ref 0–99)
NonHDL: 112.9
Total CHOL/HDL Ratio: 3
Triglycerides: 149 mg/dL (ref 0.0–149.0)
VLDL: 29.8 mg/dL (ref 0.0–40.0)

## 2022-06-12 LAB — MICROALBUMIN / CREATININE URINE RATIO
Creatinine,U: 123.6 mg/dL
Microalb Creat Ratio: 1.6 mg/g (ref 0.0–30.0)
Microalb, Ur: 1.9 mg/dL (ref 0.0–1.9)

## 2022-06-12 LAB — TSH: TSH: 1.72 u[IU]/mL (ref 0.35–5.50)

## 2022-06-12 LAB — HEMOGLOBIN A1C: Hgb A1c MFr Bld: 6.5 % (ref 4.6–6.5)

## 2022-06-12 LAB — BASIC METABOLIC PANEL
BUN: 14 mg/dL (ref 6–23)
CO2: 26 mEq/L (ref 19–32)
Calcium: 9.5 mg/dL (ref 8.4–10.5)
Chloride: 106 mEq/L (ref 96–112)
Creatinine, Ser: 1.12 mg/dL (ref 0.40–1.20)
GFR: 47.2 mL/min — ABNORMAL LOW (ref >60.00)
Glucose, Bld: 74 mg/dL (ref 70–99)
Potassium: 4 mEq/L (ref 3.5–5.1)
Sodium: 140 mEq/L (ref 135–145)

## 2022-06-12 MED ORDER — GABAPENTIN 300 MG PO CAPS: 300.0000 mg | ORAL_CAPSULE | Freq: Three times a day (TID) | ORAL | 1 refills | Status: DC

## 2022-06-12 NOTE — Patient Instructions (Signed)
Follow up in 3-4 months to recheck sugars (sooner if needed) We'll notify you of your lab results and make any changes if needed INCREASE the Gabapentin to '300mg'$  (3 pills) 3x/day until the new prescription comes and then it's 1 tab 3x/day We'll call you to schedule your appt w/ Dr Tish Frederickson Schedule your eye exam at your convenience Continue to work on low carb/low sugar diet Call with any questions or concerns Hang in there! I am SO sorry for your loss!

## 2022-06-12 NOTE — Progress Notes (Signed)
   Subjective:    Patient ID: Tracy [name], female    DOB: [birth year], 76 y.o.   MRN: [id number]  HPI Diabetes w/ neuropathy- chronic problem.  Currently diet controlled.  A1C overdue.  Last A1C 7% in February.  Due for microalbumin.  UTD on foot exam.  Due for eye exam.  Currently on Gabapentin '200mg'$  TID.  Pt reports the burning pain will keep her up at night.  Having pain in both hands and feet.  Also having RLS.  Pt has had a lot of stress recently- she had to move, daughter recently died in a car accident.  Hyperlipidemia- chronic problem, on Lipitor '10mg'$  daily.  Denies abd pain, N/V.   Review of Systems For ROS see HPI     Objective:   Physical Exam        Assessment & Plan:

## 2022-06-13 NOTE — Assessment & Plan Note (Signed)
Chronic problem.  Tolerating statin w/o difficulty.  Check labs.  Adjust meds prn  

## 2022-06-13 NOTE — Assessment & Plan Note (Signed)
New to provider, pt has hx of similar 'years' ago.  She reports she previously had medication but can't recall what she took.  Has not had issues for quite some time and wonders if sxs can be triggered by stress.  She recently had to move and lost her daughter in an MVA in August.  We are increasing the Gabapentin for the neuropathy so we'll see if this helps the RLS sxs as well.  Also has referral to neuro pending.

## 2022-06-13 NOTE — Assessment & Plan Note (Signed)
Deteriorated.  Pt reports she is having increased burning in both hands and feet due to neuropathy.  She is currently on Gabapentin '200mg'$  TID.  Will increase to '300mg'$  TID.  She has seen Neuro in the past and it would be a good idea for a follow up appt just to ensure there is nothing else going on.  Referral made.  As for diabetes- she is currently diet controlled.  Due for A1C and microalbumin.  UTD on foot exam but overdue on eye exam- pt to schedule.  Check labs and see if medication is needed.

## 2022-06-24 ENCOUNTER — Other Ambulatory Visit: Payer: Medicare Other

## 2022-06-24 ENCOUNTER — Ambulatory Visit: Payer: Medicare Other

## 2022-06-27 ENCOUNTER — Other Ambulatory Visit: Payer: Self-pay | Admitting: *Deleted

## 2022-06-27 DIAGNOSIS — C9000 Multiple myeloma not having achieved remission: Secondary | ICD-10-CM

## 2022-06-28 ENCOUNTER — Inpatient Hospital Stay: Payer: Medicare Other

## 2022-06-28 ENCOUNTER — Inpatient Hospital Stay: Payer: Medicare Other | Attending: Hematology

## 2022-06-28 ENCOUNTER — Other Ambulatory Visit: Payer: Self-pay

## 2022-06-28 VITALS — BP 111/78 | HR 90 | Temp 98.5°F | Resp 18 | Wt 186.4 lb

## 2022-06-28 DIAGNOSIS — Z5112 Encounter for antineoplastic immunotherapy: Secondary | ICD-10-CM | POA: Diagnosis not present

## 2022-06-28 DIAGNOSIS — Z95828 Presence of other vascular implants and grafts: Secondary | ICD-10-CM

## 2022-06-28 DIAGNOSIS — E1122 Type 2 diabetes mellitus with diabetic chronic kidney disease: Secondary | ICD-10-CM | POA: Insufficient documentation

## 2022-06-28 DIAGNOSIS — E538 Deficiency of other specified B group vitamins: Secondary | ICD-10-CM | POA: Diagnosis not present

## 2022-06-28 DIAGNOSIS — Z853 Personal history of malignant neoplasm of breast: Secondary | ICD-10-CM | POA: Insufficient documentation

## 2022-06-28 DIAGNOSIS — C7951 Secondary malignant neoplasm of bone: Secondary | ICD-10-CM

## 2022-06-28 DIAGNOSIS — Z8673 Personal history of transient ischemic attack (TIA), and cerebral infarction without residual deficits: Secondary | ICD-10-CM | POA: Diagnosis not present

## 2022-06-28 DIAGNOSIS — Z923 Personal history of irradiation: Secondary | ICD-10-CM | POA: Insufficient documentation

## 2022-06-28 DIAGNOSIS — D509 Iron deficiency anemia, unspecified: Secondary | ICD-10-CM | POA: Diagnosis not present

## 2022-06-28 DIAGNOSIS — C9000 Multiple myeloma not having achieved remission: Secondary | ICD-10-CM

## 2022-06-28 DIAGNOSIS — Z79899 Other long term (current) drug therapy: Secondary | ICD-10-CM | POA: Insufficient documentation

## 2022-06-28 DIAGNOSIS — N189 Chronic kidney disease, unspecified: Secondary | ICD-10-CM | POA: Insufficient documentation

## 2022-06-28 DIAGNOSIS — I252 Old myocardial infarction: Secondary | ICD-10-CM | POA: Insufficient documentation

## 2022-06-28 DIAGNOSIS — Z7189 Other specified counseling: Secondary | ICD-10-CM

## 2022-06-28 DIAGNOSIS — Z87891 Personal history of nicotine dependence: Secondary | ICD-10-CM | POA: Insufficient documentation

## 2022-06-28 DIAGNOSIS — E1140 Type 2 diabetes mellitus with diabetic neuropathy, unspecified: Secondary | ICD-10-CM | POA: Insufficient documentation

## 2022-06-28 LAB — CMP (CANCER CENTER ONLY)
ALT: 10 U/L (ref 0–44)
AST: 13 U/L — ABNORMAL LOW (ref 15–41)
Albumin: 4.4 g/dL (ref 3.5–5.0)
Alkaline Phosphatase: 71 U/L (ref 38–126)
Anion gap: 8 (ref 5–15)
BUN: 9 mg/dL (ref 8–23)
CO2: 25 mmol/L (ref 22–32)
Calcium: 9.3 mg/dL (ref 8.9–10.3)
Chloride: 109 mmol/L (ref 98–111)
Creatinine: 1.1 mg/dL — ABNORMAL HIGH (ref 0.44–1.00)
GFR, Estimated: 51 mL/min — ABNORMAL LOW (ref >60)
Glucose, Bld: 141 mg/dL — ABNORMAL HIGH (ref 70–99)
Potassium: 3.9 mmol/L (ref 3.5–5.1)
Sodium: 142 mmol/L (ref 135–145)
Total Bilirubin: 0.3 mg/dL (ref 0.3–1.2)
Total Protein: 6.9 g/dL (ref 6.5–8.1)

## 2022-06-28 LAB — CBC WITH DIFFERENTIAL (CANCER CENTER ONLY)
Abs Immature Granulocytes: 0.01 10*3/uL (ref 0.00–0.07)
Basophils Absolute: 0 10*3/uL (ref 0.0–0.1)
Basophils Relative: 0 %
Eosinophils Absolute: 0 10*3/uL (ref 0.0–0.5)
Eosinophils Relative: 0 %
HCT: 30.2 % — ABNORMAL LOW (ref 36.0–46.0)
Hemoglobin: 10.5 g/dL — ABNORMAL LOW (ref 12.0–15.0)
Immature Granulocytes: 0 %
Lymphocytes Relative: 36 %
Lymphs Abs: 1.9 10*3/uL (ref 0.7–4.0)
MCH: 33.1 pg (ref 26.0–34.0)
MCHC: 34.8 g/dL (ref 30.0–36.0)
MCV: 95.3 fL (ref 80.0–100.0)
Monocytes Absolute: 0.3 10*3/uL (ref 0.1–1.0)
Monocytes Relative: 6 %
Neutro Abs: 3 10*3/uL (ref 1.7–7.7)
Neutrophils Relative %: 58 %
Platelet Count: 273 10*3/uL (ref 150–400)
RBC: 3.17 MIL/uL — ABNORMAL LOW (ref 3.87–5.11)
RDW: 13.1 % (ref 11.5–15.5)
WBC Count: 5.2 10*3/uL (ref 4.0–10.5)
nRBC: 0 % (ref 0.0–0.2)

## 2022-06-28 MED ORDER — SODIUM CHLORIDE 0.9% FLUSH
10.0000 mL | Freq: Once | INTRAVENOUS | Status: AC
  Administered 2022-06-28: 10 mL

## 2022-06-28 MED ORDER — ONDANSETRON HCL 8 MG PO TABS
8.0000 mg | ORAL_TABLET | Freq: Once | ORAL | Status: AC
  Administered 2022-06-28: 8 mg via ORAL
  Filled 2022-06-28: qty 1

## 2022-06-28 MED ORDER — DIPHENHYDRAMINE HCL 25 MG PO CAPS
50.0000 mg | ORAL_CAPSULE | Freq: Once | ORAL | Status: AC
  Administered 2022-06-28: 50 mg via ORAL
  Filled 2022-06-28: qty 2

## 2022-06-28 MED ORDER — SODIUM CHLORIDE 0.9 % IV SOLN: Freq: Once | INTRAVENOUS | Status: AC

## 2022-06-28 MED ORDER — FAMOTIDINE IN NACL 20-0.9 MG/50ML-% IV SOLN
20.0000 mg | Freq: Once | INTRAVENOUS | Status: AC
  Administered 2022-06-28: 20 mg via INTRAVENOUS
  Filled 2022-06-28: qty 50

## 2022-06-28 MED ORDER — HEPARIN SOD (PORK) LOCK FLUSH 100 UNIT/ML IV SOLN
500.0000 [IU] | Freq: Once | INTRAVENOUS | Status: AC | PRN
  Administered 2022-06-28: 500 [IU]

## 2022-06-28 MED ORDER — SODIUM CHLORIDE 0.9 % IV SOLN
16.0000 mg | Freq: Once | INTRAVENOUS | Status: AC
  Administered 2022-06-28: 16 mg via INTRAVENOUS
  Filled 2022-06-28: qty 1.6

## 2022-06-28 MED ORDER — ACETAMINOPHEN 325 MG PO TABS
650.0000 mg | ORAL_TABLET | Freq: Once | ORAL | Status: AC
  Administered 2022-06-28: 650 mg via ORAL
  Filled 2022-06-28: qty 2

## 2022-06-28 MED ORDER — SODIUM CHLORIDE 0.9 % IV SOLN
16.0000 mg/kg | Freq: Once | INTRAVENOUS | Status: AC
  Administered 2022-06-28: 1300 mg via INTRAVENOUS
  Filled 2022-06-28: qty 60

## 2022-06-28 MED ORDER — ALTEPLASE 2 MG IJ SOLR
2.0000 mg | Freq: Once | INTRAMUSCULAR | Status: AC
  Administered 2022-06-28: 2 mg
  Filled 2022-06-28: qty 2

## 2022-06-28 MED ORDER — SODIUM CHLORIDE 0.9% FLUSH
10.0000 mL | INTRAVENOUS | Status: DC | PRN
  Administered 2022-06-28: 10 mL

## 2022-06-28 NOTE — Patient Instructions (Signed)
[facility] ONCOLOGY  Discharge Instructions: Thank you for choosing [facility] to provide your oncology and hematology care.   If you have a lab appointment with the [facility], please go directly to the [facility] and check in at the registration area.   Wear comfortable clothing and clothing appropriate for easy access to any Portacath or PICC line.   We strive to give you quality time with your provider. You may need to reschedule your appointment if you arrive late (15 or more minutes).  Arriving late affects you and other patients whose appointments are after yours.  Also, if you miss three or more appointments without notifying the office, you may be dismissed from the clinic at the provider's discretion.      For prescription refill requests, have your pharmacy contact our office and allow 72 hours for refills to be completed.    Today you received the following chemotherapy and/or immunotherapy agents: Daratumumab.       To help prevent nausea and vomiting after your treatment, we encourage you to take your nausea medication as directed.  BELOW ARE SYMPTOMS THAT SHOULD BE REPORTED IMMEDIATELY: *FEVER GREATER THAN 100.4 F (38 C) OR HIGHER *CHILLS OR SWEATING *NAUSEA AND VOMITING THAT IS NOT CONTROLLED WITH YOUR NAUSEA MEDICATION *UNUSUAL SHORTNESS OF BREATH *UNUSUAL BRUISING OR BLEEDING *URINARY PROBLEMS (pain or burning when urinating, or frequent urination) *BOWEL PROBLEMS (unusual diarrhea, constipation, pain near the anus) TENDERNESS IN MOUTH AND THROAT WITH OR WITHOUT PRESENCE OF ULCERS (sore throat, sores in mouth, or a toothache) UNUSUAL RASH, SWELLING OR PAIN  UNUSUAL VAGINAL DISCHARGE OR ITCHING   Items with * indicate a potential emergency and should be followed up as soon as possible or go to the Emergency Department if any problems should occur.  Please show the CHEMOTHERAPY ALERT CARD or IMMUNOTHERAPY ALERT CARD at check-in  to the Emergency Department and triage nurse.  Should you have questions after your visit or need to cancel or reschedule your appointment, please contact [facility]  Dept: [phone]  and follow the prompts.  Office hours are 8:00 a.m. to 4:30 p.m. Monday - Friday. Please note that voicemails left after 4:00 p.m. may not be returned until the following business day.  We are closed weekends and major holidays. You have access to a nurse at all times for urgent questions. Please call the main number to the clinic Dept: [phone] and follow the prompts.   For any non-urgent questions, you may also contact your provider using MyChart. We now offer e-Visits for anyone 64 and older to request care online for non-urgent symptoms. For details visit [email or web address].   Also download the MyChart app! Go to the app store, search "MyChart", open the app, select [facility], and log in with your MyChart username and password.  Masks are optional in the cancer centers. If you would like for your care team to wear a mask while they are taking care of you, please let them know. You may have one support person who is at least 78 years old accompany you for your appointments.

## 2022-07-02 LAB — MULTIPLE MYELOMA PANEL, SERUM
Albumin SerPl Elph-Mcnc: 3.5 g/dL (ref 2.9–4.4)
Albumin/Glob SerPl: 1.4 (ref 0.7–1.7)
Alpha 1: 0.2 g/dL (ref 0.0–0.4)
Alpha2 Glob SerPl Elph-Mcnc: 1.1 g/dL — ABNORMAL HIGH (ref 0.4–1.0)
B-Globulin SerPl Elph-Mcnc: 0.9 g/dL (ref 0.7–1.3)
Gamma Glob SerPl Elph-Mcnc: 0.4 g/dL (ref 0.4–1.8)
Globulin, Total: 2.6 g/dL (ref 2.2–3.9)
IgA: 61 mg/dL — ABNORMAL LOW (ref 64–422)
IgG (Immunoglobin G), Serum: 437 mg/dL — ABNORMAL LOW (ref 586–1602)
IgM (Immunoglobulin M), Srm: 45 mg/dL (ref 26–217)
Total Protein ELP: 6.1 g/dL (ref 6.0–8.5)

## 2022-07-15 ENCOUNTER — Ambulatory Visit (INDEPENDENT_AMBULATORY_CARE_PROVIDER_SITE_OTHER): Payer: Medicare Other | Admitting: Diagnostic Neuroimaging

## 2022-07-15 ENCOUNTER — Encounter: Payer: Self-pay | Admitting: Diagnostic Neuroimaging

## 2022-07-15 VITALS — BP 160/80 | HR 74 | Ht 60.0 in | Wt 188.0 lb

## 2022-07-15 DIAGNOSIS — G629 Polyneuropathy, unspecified: Secondary | ICD-10-CM | POA: Diagnosis not present

## 2022-07-15 MED ORDER — GABAPENTIN 300 MG PO CAPS: 600.0000 mg | ORAL_CAPSULE | Freq: Three times a day (TID) | ORAL | 4 refills | Status: DC

## 2022-07-15 NOTE — Patient Instructions (Addendum)
  SMALL FIBER NEUROPATHY (diabetes, iron deficiency, multiple myeloma can be associated with neuropathy + RLS) - increase gabapentin as tolerated; can try up to 638m three times a day; ease up to 300 / 300 / 600 to start - Other painful neuropathy treatment options: -duloxetine 30-637mdaily, amitriptyline 25-5047mt bedtime, pregabalin 75-150m52mice a day -capsaicin cream, lidocaine patch / cream, alpha-lipoic acid 600mg67mly  GAIT DIFFICULTY (intermittent; stable) - supportive care; use cane - optimize nutrition, exercise; consider PT evaluation  SLEEP APNEA (stable, not treated) - consider CPAP (patient declines at this time)  ANXIETY / INSOMNIA (not stable) - consider therapist / counselor for anxiety / insomnia - continue trazodone per PCP

## 2022-07-15 NOTE — Progress Notes (Signed)
[facility]  PATIENT: Tracy [name] DOB: 08-Jun-1946  REFERRING CLINICIAN: [name], MD  HISTORY FROM: patient  REASON FOR VISIT: new consult   HISTORICAL  CHIEF COMPLAINT:  Chief Complaint  Patient presents with   Numbness    Rm 7 alone Pt is well, here for bilateral RLS. She is also having tingling and burning in feet and hands.     HISTORY OF PRESENT ILLNESS:   UPDATE (07/15/22, VRP): Since last visit, now with multiple myeloma dx since 2021, on treatment. In last 1 year, neuropathy symptoms are worsening. A1c is improving (6.5). on gabapentin 31m three times a day.  UPDATE (03/02/18, VRP): Since last visit, doing about the same. Symptoms are intermittent falling. Had 2 more falls. No major injuries. Severity is moderate. No alleviating or aggravating factors. Tolerating meds. Did not try PT yet. Does not want CPAP.   NEW HPI (08/29/17): 76year old old female here for evaluation of dizziness.  I previously saw patient in 2013 for similar symptoms.  Patient reports a balance and walking problem. Intermittent loss of energy in body. General low energy. Daily attacks, up to 30 minutes at a time. No falls.  Patient denies any focal weakness in her arms or legs.  She feels general weakness and fatigue.  Separately has vertigo attacks 1-2 x per year; meclizine helps.   UPDATE 04/13/12: Doing better. Dizziness improved. Patient reports remote history of RLS and poor sleep. Still with unrefreshing sleep. Epworth score 12. Snores at nighttime.   PRIOR HPI (02/28/12): 76year old right-handed female with history of hypertension, breast cancer 2001, status post chemotherapy and radiation therapy, restless leg syndrome, here for evaluation of intermittent episodes of dizziness since the beginning of July 2013. Patient describes brief episodes of "dizziness" which he describes as a off-balance and exhaustion feeling. Sometimes she has nausea with this. Typically  triggered when she wakes up in the morning and sits up or stands up. Sometimes she falls back to the bed and has to sit there for several moments before able to stand up again. She denies true room spinning sensation. No slurred speech, trouble talking, double vision, passing out. She's tried meclizine which did not help her, and patient thinks may have made symptoms worse. Patient reports prior episode of vertigo lasting one to 2 days, which previously responded well to meclizine.   REVIEW OF SYSTEMS: Full 14 system review of systems performed and negative with exception of: memory loss trouble swallowing restless legs neck pain anxiety confusion cramps light sensitivity.    ALLERGIES: Allergies  Allergen Reactions   Bacitracin-Neomycin-Polymyxin  [Neomycin-Bacitracin Zn-Polymyx] Swelling   Nsaids Other (See Comments)    nosebleeds   Tape Hives    Pt cannot tolerate bandaids, tape, or any other adhesives.    Ambien [Zolpidem Tartrate]     Hives    Amoxicillin     Rash    Clavulanic Acid Hives   Contrast Media [Iodinated Contrast Media] Hives    Pt states hives with prior ct, was given benadryl to resolve   Latex Swelling   Prednisone Swelling    Pt tolerates Dexamethasone. Throat swelling   Tessalon [Benzonatate] Itching and Rash    HOME MEDICATIONS: Outpatient Medications Prior to Visit  Medication Sig Dispense Refill   acyclovir (ZOVIRAX) 400 MG tablet Take 1 tablet (400 mg total) by mouth 2 (two) times daily. 180 tablet 1   atorvastatin (LIPITOR) 10 MG tablet TAKE 1 TABLET BY MOUTH DAILY 100 tablet 2  azelastine (ASTELIN) 0.1 % nasal spray Place 1 spray into both nostrils 2 (two) times daily. Use in each nostril as directed 30 mL 12   glucose blood (ACCU-CHEK GUIDE) test strip Use as instructed to check sugars 1-2 times daily. 100 each 12   hydrocortisone 2.5 % cream Apply topically 2 (two) times daily. 30 g 1   loratadine (CLARITIN) 10 MG tablet Take 10 mg by mouth daily.      meclizine (ANTIVERT) 25 MG tablet Take 1 tablet (25 mg total) by mouth 3 (three) times daily as needed for dizziness. 30 tablet 0   oxymetazoline (AFRIN) 0.05 % nasal spray Place 1 spray into both nostrils 2 (two) times daily as needed for congestion. Or bloody nose. 30 mL 0   pantoprazole (PROTONIX) 40 MG tablet TAKE 1 TABLET BY MOUTH TWICE  DAILY 180 tablet 3   tiZANidine (ZANAFLEX) 4 MG tablet TAKE 1 TABLET BY MOUTH AT  BEDTIME 90 tablet 1   traZODone (DESYREL) 100 MG tablet TAKE 1 TABLET BY MOUTH AT  BEDTIME 90 tablet 3   gabapentin (NEURONTIN) 300 MG capsule Take 1 capsule (300 mg total) by mouth 3 (three) times daily. 270 capsule 1   No facility-administered medications prior to visit.    PAST MEDICAL HISTORY: Past Medical History:  Diagnosis Date   Angiodysplasia of intestine 08/23/2020   Anxiety    Arthritis    Breast cancer ([place]) 06/13/15   Cancer ([place]) 2000   breast cancer   Chronic bronchitis ([place])    Chronic bronchitis ([place])    Hyperlipidemia    Hypertension    Myocardial infarction [facility]) 2001   Personal history of radiation therapy    Restless leg    Stroke ([place]) 2004   TIA, no deficits    PAST SURGICAL HISTORY: Past Surgical History:  Procedure Laterality Date   ABDOMINAL HYSTERECTOMY  1985   BREAST LUMPECTOMY Left 2000   radiation and chemo   BREAST LUMPECTOMY Right 2016   radiation   BREAST SURGERY  2001   lt breast lumpectomy   COLONOSCOPY WITH ESOPHAGOGASTRODUODENOSCOPY (EGD)  04/2020   ENTEROSCOPY N/A 03/15/2021   Procedure: ENTEROSCOPY;  Surgeon: Milus Banister, MD;  Location: [facility];  Service: Endoscopy;  Laterality: N/A;   GIVENS CAPSULE STUDY  07/2020   HOT HEMOSTASIS N/A 03/15/2021   Procedure: HOT HEMOSTASIS (ARGON PLASMA COAGULATION/BICAP);  Surgeon: Milus Banister, MD;  Location: [facility];  Service: Endoscopy;  Laterality: N/A;   IR IMAGING GUIDED PORT INSERTION  04/25/2020   RADIOACTIVE SEED GUIDED PARTIAL MASTECTOMY WITH  AXILLARY SENTINEL LYMPH NODE BIOPSY Right 07/07/2015   Procedure: RIGHT RADIOACTIVE SEED GUIDED PARTIAL MASTECTOMY WITH AXILLARY SENTINEL LYMPH NODE BIOPSY;  Surgeon: Autumn Messing III, MD;  Location: [facility];  Service: General;  Laterality: Right;   SMALL INTESTINE SURGERY     TUBAL LIGATION     UPPER GASTROINTESTINAL ENDOSCOPY      FAMILY HISTORY: Family History  Problem Relation Age of Onset   Emphysema Mother 15       smoker   Diabetes Father    Lung cancer Sister        dx. <50; former smoker   Diabetes Brother    Diabetes Brother    Brain cancer Brother 40       unknown tumor type   Diabetes Paternal Aunt    Stroke Maternal Grandmother    Diabetes Paternal Grandmother    Cancer Daughter 91  neck cancer   Other Daughter        hysterectomy for unspecified reason   Colon cancer Daughter    Breast cancer Cousin    Cancer Cousin        unspecified type   Breast cancer Other        triple negative breast cancer in her 71s   Colon polyps Neg Hx    Esophageal cancer Neg Hx    Gallbladder disease Neg Hx     SOCIAL HISTORY:  Social History   Socioeconomic History   Marital status: Divorced    Spouse name: Not on file   Number of children: 7   Years of education: Not on file   Highest education level: Not on file  Occupational History   Occupation: retired  Tobacco Use   Smoking status: Former    Packs/day: 1.00    Years: 20.00    Total pack years: 20.00    Types: Cigarettes    Quit date: 07/30/2011    Years since quitting: 10.9   Smokeless tobacco: Never   Tobacco comments:    Quit >4 years ago; 1 ppd for about 5/20 years (remaining was less)  Vaping Use   Vaping Use: Former  Substance and Sexual Activity   Alcohol use: No    Alcohol/week: 0.0 standard drinks of alcohol   Drug use: No   Sexual activity: Not Currently  Other Topics Concern   Not on file  Social History Narrative   Lives alone.  Retired.  Education:  11th grade GED.   Children:  7 (one here).    Social Determinants of Health   Financial Resource Strain: High Risk (12/21/2021)   Overall Financial Resource Strain (CARDIA)    Difficulty of Paying Living Expenses: Hard  Food Insecurity: Food Insecurity Present (12/21/2021)   Hunger Vital Sign    Worried About Running Out of Food in the Last Year: Often true    Ran Out of Food in the Last Year: Often true  Transportation Needs: No Transportation Needs (05/14/2022)   PRAPARE - Hydrologist (Medical): No    Lack of Transportation (Non-Medical): No  Physical Activity: Inactive (12/20/2021)   Exercise Vital Sign    Days of Exercise per Week: 0 days    Minutes of Exercise per Session: 0 min  Stress: No Stress Concern Present (12/20/2021)   [place]    Feeling of Stress : Not at all  Social Connections: Moderately Integrated (12/20/2021)   Social Connection and Isolation Panel [NHANES]    Frequency of Communication with Friends and Family: More than three times a week    Frequency of Social Gatherings with Friends and Family: More than three times a week    Attends Religious Services: More than 4 times per year    Active Member of [employer] or Organizations: Yes    Attends Archivist Meetings: More than 4 times per year    Marital Status: Divorced  Intimate Partner Violence: Not At Risk (12/20/2021)   Humiliation, Afraid, Rape, and Kick questionnaire    Fear of Current or Ex-Partner: No    Emotionally Abused: No    Physically Abused: No    Sexually Abused: No     PHYSICAL EXAM  GENERAL EXAM/CONSTITUTIONAL: Vitals:  Vitals:   07/15/22 0958  BP: (!) 160/80  Pulse: 74  Weight: 188 lb (85.3 kg)  Height: 5' (1.524 m)   Body  mass index is 36.72 kg/m. Wt Readings from Last 3 Encounters:  07/15/22 188 lb (85.3 kg)  06/28/22 186 lb 6.4 oz (84.6 kg)  06/12/22 185 lb 4 oz (84 kg)   Patient is in no  distress; well developed, nourished and groomed; neck is supple  CARDIOVASCULAR: Examination of carotid arteries is normal; no carotid bruits Regular rate and rhythm, no murmurs Examination of peripheral vascular system by observation and palpation is normal  EYES: Ophthalmoscopic exam of optic discs and posterior segments is normal; no papilledema or hemorrhages No results found.  MUSCULOSKELETAL: Gait, strength, tone, movements noted in Neurologic exam below  NEUROLOGIC: MENTAL STATUS:      No data to display         awake, alert, oriented to person, place and time recent and remote memory intact normal attention and concentration language fluent, comprehension intact, naming intact fund of knowledge appropriate  CRANIAL NERVE:  2nd - no papilledema on fundoscopic exam 2nd, 3rd, 4th, 6th - pupils equal and reactive to light, visual fields full to confrontation, extraocular muscles intact, no nystagmus 5th - facial sensation symmetric 7th - facial strength symmetric 8th - hearing intact 9th - palate elevates symmetrically, uvula midline 11th - shoulder shrug symmetric 12th - tongue protrusion midline  MOTOR:  normal bulk and tone, full strength in the BUE, BLE  SENSORY:  normal and symmetric to light touch  COORDINATION:  finger-nose-finger, fine finger movements normal  REFLEXES:  deep tendon reflexes TRACE and symmetric  GAIT/STATION:  narrow based gait    DIAGNOSTIC DATA (LABS, IMAGING, TESTING) - I reviewed patient records, labs, notes, testing and imaging myself where available.  Lab Results  Component Value Date   WBC 5.2 06/28/2022   HGB 10.5 (L) 06/28/2022   HCT 30.2 (L) 06/28/2022   MCV 95.3 06/28/2022   PLT 273 06/28/2022      Component Value Date/Time   NA 142 06/28/2022 0944   NA 139 07/13/2015 1516   K 3.9 06/28/2022 0944   K 3.9 07/13/2015 1516   CL 109 06/28/2022 0944   CO2 25 06/28/2022 0944   CO2 25 07/13/2015 1516   GLUCOSE  141 (H) 06/28/2022 0944   GLUCOSE 85 07/13/2015 1516   BUN 9 06/28/2022 0944   BUN 7.9 07/13/2015 1516   CREATININE 1.10 (H) 06/28/2022 0944   CREATININE 0.9 07/13/2015 1516   CALCIUM 9.3 06/28/2022 0944   CALCIUM 8.9 07/13/2015 1516   PROT 6.9 06/28/2022 0944   PROT 7.6 07/13/2015 1516   ALBUMIN 4.4 06/28/2022 0944   ALBUMIN 3.3 (L) 07/13/2015 1516   AST 13 (L) 06/28/2022 0944   AST 16 07/13/2015 1516   ALT 10 06/28/2022 0944   ALT 12 07/13/2015 1516   ALKPHOS 71 06/28/2022 0944   ALKPHOS 99 07/13/2015 1516   BILITOT 0.3 06/28/2022 0944   BILITOT <0.30 07/13/2015 1516   GFRNONAA 51 (L) 06/28/2022 0944   GFRNONAA 61 05/10/2014 1147   GFRAA 41 (L) 04/21/2020 1024   GFRAA 70 05/10/2014 1147   Lab Results  Component Value Date   CHOL 179 06/12/2022   HDL 66.20 06/12/2022   LDLCALC 83 06/12/2022   LDLDIRECT 154.7 04/05/2013   TRIG 149.0 06/12/2022   CHOLHDL 3 06/12/2022   Lab Results  Component Value Date   HGBA1C 6.5 06/12/2022   Lab Results  Component Value Date   VITAMINB12 1,359 (H) 09/03/2021   Lab Results  Component Value Date   TSH 1.72 06/12/2022  03/16/12 MRI brain - Normal MRI brain and IAC (with and without contrast). Incidental partially empty sella.   08/13/17 MRI brain [I reviewed images myself and agree with interpretation. -VRP]  - Stable since 2013 and normal noncontrast MRI appearance of the brain.  08/13/17 MRI cervical spine [I reviewed images myself and agree with interpretation. -VRP]  - Mild degenerative changes at C3-C4 and C5-C6 as described above. No high-grade spinal canal or neuroforaminal stenosis at any level.  09/10/17 MRI lumbar spine [I reviewed images myself and agree with interpretation. -VRP]  - MRI scan of the lumbar spine showing prominent degenerative changes with left lateral disc osteophyte protrusions at L2-3, to L5-S1 resulting in moderate to severe foraminal narrowing most severe at L5-S1 where there may be involvement of  the exiting nerve root on the left.  09/11/17 EMG/NCS - normal     ASSESSMENT AND PLAN  76 y.o. year old female here with:  Dx:  1. Small fiber neuropathy      PLAN:  SMALL FIBER NEUROPATHY (diabetes, iron deficiency, multiple myeloma can be associated with neuropathy + RLS) - increase gabapentin as tolerated; can try up to 611m three times a day; ease up to 300 / 300 / 600 to start - Other painful neuropathy treatment options: -duloxetine 30-661mdaily, amitriptyline 25-5041mt bedtime, pregabalin 75-150m47mice a day -capsaicin cream, lidocaine patch / cream, alpha-lipoic acid 600mg37mly  GAIT DIFFICULTY (intermittent; stable) - supportive care; use cane - optimize nutrition, exercise; consider PT evaluation  SLEEP APNEA (stable, not treated) - consider CPAP (patient declines at this time)  ANXIETY / INSOMNIA (not stable) - consider therapist / counselor for anxiety / insomnia - continue trazodone per PCP  Meds ordered this encounter  Medications   gabapentin (NEURONTIN) 300 MG capsule    Sig: Take 2 capsules (600 mg total) by mouth 3 (three) times daily.    Dispense:  540 capsule    Refill:  4   Return for pending if symptoms worsen or fail to improve.     VIKRAPenni [id number] in Neurology, Neurophysiology and Neuroimaging  [facility] [id number]teStonewallnBay [phone]

## 2022-07-17 NOTE — Progress Notes (Unsigned)
[name]   [phone] [phone]   Clinic Follow up Note   Patient Care Team: [name], MD as PCP - General (Family Medicine) [name], MD as Attending Physician (Physical Medicine and Rehabilitation) [name], MD as Consulting Physician (Orthopedic Surgery) [name], MD as Consulting Physician (Otolaryngology) [name], MD as Consulting Physician (Gastroenterology) [name], MD as Consulting Physician (General Surgery) Edythe Clarity, [facility] (Pharmacist) 07/18/2022  CHIEF COMPLAINT: Follow up multiple myeloma and h/o breast cancer   SUMMARY OF ONCOLOGIC HISTORY: Oncology History  History of right breast cancer  04/16/2000 Surgery   Left breast: Triple negative  invasive ductal cancer treated with lumpectomy, adjuvant chemotherapy, radiation , in [place], unknown stage   06/07/2015 Mammogram   Right breast mass 6x 6 x 5 mm, right axillary lymph node with slight cortex thickening measured 5 mm    06/13/2015 Initial Diagnosis   Right breast needle biopsy: Invasive ductal carcinoma, grade 1, right axillary lymph node biopsy negative , ER 95%, PR 5%, Ki-67 10%, HER-2 negative   06/13/2015 Clinical Stage   Stage IA: T1b N0   07/07/2015 Surgery   Right lumpectomy: Invasive ductal carcinoma grade 1, 1 cm span, with low-grade DCIS, DCIS focally 0.1 cm to inferior margin, 0/3 lymph nodes negative, ER 95%, PR 5%, HER-2 negative ratio 1.1, Ki-67 10%   07/07/2015 Pathologic Stage   Stage IA: T1c N0   07/13/2015 Procedure   Breast High/Moderate Risk Panel reveals no clinically significant variant at ATM, BRCA1, BRCA2, CDH1, CHEK2, PALB2, PTEN, and TP53.     08/23/2015 - 09/21/2015 Radiation Therapy   Adjuvant Radiation: Right breast/ 42.5Gy at 2.5 Gy per fraction x 17 fractions.   Right breast boost/ 7.5 Gy at 2.5 Gy per fraction x 3 fractions    Anti-estrogen oral therapy   Patient refused antiestrogen  therapy   10/20/2015 Survivorship   Survivorship care plan completed and mailed to patient in lieu of in person visit at her request   Iron deficiency anemia  Multiple myeloma ([place])  01/25/2020 Initial Diagnosis   Multiple myeloma ([place])   02/11/2020 - 05/19/2020 Adjuvant Chemotherapy   Daratumumab Weekly    02/29/2020 - 03/13/2020 Radiation Therapy   The targets were treated to a total dose of 20 Gy in 10 fractions of 2 Gy each to the left hip and L5 using one plan.   06/02/2020 - 10/05/2020 Adjuvant Chemotherapy   Daratumumab and Carfilzomib   10/19/2020 -  Adjuvant Chemotherapy   Maintenance Daratumumab--initially every 2 weeks, then every 4 weeks.    05/24/2022 -  Chemotherapy   Patient is on Treatment Plan : MYELOMA Daratumumab IV q28d     Malignant neoplasm metastatic to bone ([place])  02/08/2021 Initial Diagnosis   Bone metastases ([place])   05/24/2022 -  Chemotherapy   Patient is on Treatment Plan : MYELOMA Daratumumab IV q28d       CURRENT THERAPY:  Daratumumab q28 days  Aredia q8 weeks B12 q4 weeks  INTERVAL HISTORY: [name] returns for follow up as scheduled. Last seen by Dr. Irene Limbo 05/24/22. She continues monthly Dara. Tolerating well without side effects except she has nausea with rapid infusion. She does not require medication. She recently increased gabapentin to 300 mg twice daily and 400 mg at night which is helping better. She feels tightness/pressure at the port catheter in her neck. No swelling. Denies bone pain, fever, chills, cough, chest pain, or other new/specific complaints.  REVIEW OF SYSTEMS:   All other systems were reviewed with the patient and are negative.  MEDICAL HISTORY:  Past Medical History:  Diagnosis Date   Angiodysplasia of intestine 08/23/2020   Anxiety    Arthritis    Breast cancer ([name]) 06/13/15   Cancer ([name]) 2000   breast cancer   Chronic bronchitis ([place])    Chronic bronchitis ([place])    Hyperlipidemia    Hypertension    Myocardial  infarction [facility]) 2001   Personal history of radiation therapy    Restless leg    Stroke ([place]) 2004   TIA, no deficits    SURGICAL HISTORY: Past Surgical History:  Procedure Laterality Date   ABDOMINAL HYSTERECTOMY  1985   BREAST LUMPECTOMY Left 2000   radiation and chemo   BREAST LUMPECTOMY Right 2016   radiation   BREAST SURGERY  2001   lt breast lumpectomy   COLONOSCOPY WITH ESOPHAGOGASTRODUODENOSCOPY (EGD)  04/2020   ENTEROSCOPY N/A 03/15/2021   Procedure: ENTEROSCOPY;  Surgeon: Milus Banister, MD;  Location: [facility] ENDOSCOPY;  Service: Endoscopy;  Laterality: N/A;   GIVENS CAPSULE STUDY  07/2020   HOT HEMOSTASIS N/A 03/15/2021   Procedure: HOT HEMOSTASIS (ARGON PLASMA COAGULATION/BICAP);  Surgeon: Milus Banister, MD;  Location: [facility];  Service: Endoscopy;  Laterality: N/A;   IR IMAGING GUIDED PORT INSERTION  04/25/2020   RADIOACTIVE SEED GUIDED PARTIAL MASTECTOMY WITH AXILLARY SENTINEL LYMPH NODE BIOPSY Right 07/07/2015   Procedure: RIGHT RADIOACTIVE SEED GUIDED PARTIAL MASTECTOMY WITH AXILLARY SENTINEL LYMPH NODE BIOPSY;  Surgeon: Autumn Messing III, MD;  Location: [facility];  Service: General;  Laterality: Right;   SMALL INTESTINE SURGERY     TUBAL LIGATION     UPPER GASTROINTESTINAL ENDOSCOPY      I have reviewed the social history and family history with the patient and they are unchanged from previous note.  ALLERGIES:  is allergic to bacitracin-neomycin-polymyxin  [neomycin-bacitracin zn-polymyx], nsaids, tape, ambien [zolpidem tartrate], amoxicillin, clavulanic acid, contrast media [iodinated contrast media], latex, prednisone, and tessalon [benzonatate].  MEDICATIONS:  Current Outpatient Medications  Medication Sig Dispense Refill   acyclovir (ZOVIRAX) 400 MG tablet Take 1 tablet (400 mg total) by mouth 2 (two) times daily. 180 tablet 1   atorvastatin (LIPITOR) 10 MG tablet TAKE 1 TABLET BY MOUTH DAILY 100 tablet 2   azelastine (ASTELIN) 0.1 %  nasal spray Place 1 spray into both nostrils 2 (two) times daily. Use in each nostril as directed 30 mL 12   gabapentin (NEURONTIN) 300 MG capsule Take 2 capsules (600 mg total) by mouth 3 (three) times daily. 540 capsule 4   glucose blood (ACCU-CHEK GUIDE) test strip Use as instructed to check sugars 1-2 times daily. 100 each 12   hydrocortisone 2.5 % cream Apply topically 2 (two) times daily. 30 g 1   loratadine (CLARITIN) 10 MG tablet Take 10 mg by mouth daily.     meclizine (ANTIVERT) 25 MG tablet Take 1 tablet (25 mg total) by mouth 3 (three) times daily as needed for dizziness. 30 tablet 0   oxymetazoline (AFRIN) 0.05 % nasal spray Place 1 spray into both nostrils 2 (two) times daily as needed for congestion. Or bloody nose. 30 mL 0   pantoprazole (PROTONIX) 40 MG tablet TAKE 1 TABLET BY MOUTH TWICE  DAILY 180 tablet 3   tiZANidine (ZANAFLEX) 4 MG tablet TAKE 1 TABLET BY MOUTH AT  BEDTIME 90 tablet 1   traZODone (DESYREL) 100 MG tablet TAKE 1 TABLET  BY MOUTH AT  BEDTIME 90 tablet 3   No current facility-administered medications for this visit.   Facility-Administered Medications Ordered in Other Visits  Medication Dose Route Frequency Provider Last Rate Last Admin   daratumumab (DARZALEX) 1,300 mg in sodium chloride 0.9 % 435 mL chemo infusion  16 mg/kg (Treatment Plan Recorded) Intravenous Once Truitt Merle, MD        PHYSICAL EXAMINATION: ECOG PERFORMANCE STATUS: 1 - Symptomatic but completely ambulatory  Vitals:   07/18/22 1114  BP: (!) 148/64  Pulse: 78  Resp: 20  Temp: 98.6 F (37 C)  SpO2: 100%   Filed Weights   07/18/22 1114  Weight: 187 lb (84.8 kg)    GENERAL:alert, no distress and comfortable SKIN: no rash  EYES: sclera clear NECK: without mass or swelling. No venous engorgement LUNGS: clear with normal breathing effort HEART: regular rate & rhythm, no lower extremity edema ABDOMEN:abdomen soft, non-tender and normal bowel sounds NEURO: alert & oriented x 3 with  fluent speech. Wheelchair exam PAC without erythema    LABORATORY DATA:  I have reviewed the data as listed    Latest Ref Rng & Units 07/18/2022   10:45 AM 06/28/2022    9:44 AM 06/12/2022    2:01 PM  CBC  WBC 4.0 - 10.5 K/uL 5.0  5.2  6.1   Hemoglobin 12.0 - 15.0 g/dL 10.4  10.5  10.7   Hematocrit 36.0 - 46.0 % 29.9  30.2  31.3   Platelets 150 - 400 K/uL 265  273  257.0         Latest Ref Rng & Units 07/18/2022   10:45 AM 06/28/2022    9:44 AM 06/12/2022    2:01 PM  CMP  Glucose 70 - 99 mg/dL 119  141  74   BUN 8 - 23 mg/dL _0 Creatinine 0.44 - 1.00 mg/dL 1.16  1.10  1.12   Sodium 135 - 145 mmol/L 144  142  140   Potassium 3.5 - 5.1 mmol/L 3.9  3.9  4.0   Chloride 98 - 111 mmol/L 109  109  106   CO2 22 - 32 mmol/L _1 Calcium 8.9 - 10.3 mg/dL 8.5  9.3  9.5   Total Protein 6.5 - 8.1 g/dL 6.4  6.9  6.7   Total Bilirubin 0.3 - 1.2 mg/dL 0.3  0.3  0.2   Alkaline Phos 38 - 126 U/L 70  71  69   AST 15 - 41 U/L _2 ALT 0 - 44 U/L _3 RADIOGRAPHIC STUDIES: I have personally reviewed the radiological images as listed and agreed with the findings in the report. No results found.   ASSESSMENT & PLAN: 76 yo    1)  IgG Kappa Multiple myeloma with bone lesions, anemia, renal insuff. M spike @ 3.7g/dl on diagnosis. 1p deletion, polymorphic variant, 13q deletion Multiple myeloma panel from 06/28/2021 with no M spike.  IFE positive for IgG kappa monoclonal protein possibly from her daratumumab. Currently on monthly daratumumab and Aredia q8 weeks 2) h/o DM2 3) Diabetic Neuropathy - recently increased gabapentin per neurology  4) CKD - likely from DM2, but could have an element of myeloma kidney. 5) h/o TIA and AMI 6) Iron deficiency 7) B12 deficiency on B12 q4 weeks   Disposition:  Ms. Sallis appears stable. She continues monthly Daratumumab  which she is tolerating well with occasional nausea. Side effects are adequately managed with  supportive care. She is able to recover and function well. No clinical evidence of disease progression today. Most recent MM panel without detectable M protein. She is responding well. Continue same regimen.   She has no new bone pain. Continue aredia q8 weeks. Due today but not scheduled, and no room for add on in infusion so she will return tomorrow. She will receive monthly B12 inj today with Dara.   F/up with Dr. Irene Limbo and next Dara and B12 in 4 weeks.   All questions were answered. The patient knows to call the clinic with any problems, questions or concerns. No barriers to learning were detected.     Alla Feeling, NP 07/18/22

## 2022-07-18 ENCOUNTER — Inpatient Hospital Stay: Payer: Medicare Other

## 2022-07-18 ENCOUNTER — Encounter: Payer: Self-pay | Admitting: Nurse Practitioner

## 2022-07-18 ENCOUNTER — Inpatient Hospital Stay: Payer: Medicare Other | Admitting: Nurse Practitioner

## 2022-07-18 ENCOUNTER — Other Ambulatory Visit: Payer: Self-pay | Admitting: Hematology

## 2022-07-18 ENCOUNTER — Other Ambulatory Visit: Payer: Self-pay

## 2022-07-18 VITALS — BP 142/68 | HR 72 | Temp 98.2°F | Resp 18

## 2022-07-18 DIAGNOSIS — Z853 Personal history of malignant neoplasm of breast: Secondary | ICD-10-CM | POA: Diagnosis not present

## 2022-07-18 DIAGNOSIS — C7951 Secondary malignant neoplasm of bone: Secondary | ICD-10-CM | POA: Diagnosis not present

## 2022-07-18 DIAGNOSIS — Z5112 Encounter for antineoplastic immunotherapy: Secondary | ICD-10-CM | POA: Diagnosis not present

## 2022-07-18 DIAGNOSIS — D509 Iron deficiency anemia, unspecified: Secondary | ICD-10-CM | POA: Diagnosis not present

## 2022-07-18 DIAGNOSIS — Z7189 Other specified counseling: Secondary | ICD-10-CM | POA: Diagnosis not present

## 2022-07-18 DIAGNOSIS — Z8673 Personal history of transient ischemic attack (TIA), and cerebral infarction without residual deficits: Secondary | ICD-10-CM | POA: Diagnosis not present

## 2022-07-18 DIAGNOSIS — E1140 Type 2 diabetes mellitus with diabetic neuropathy, unspecified: Secondary | ICD-10-CM | POA: Diagnosis not present

## 2022-07-18 DIAGNOSIS — C9000 Multiple myeloma not having achieved remission: Secondary | ICD-10-CM

## 2022-07-18 DIAGNOSIS — Z923 Personal history of irradiation: Secondary | ICD-10-CM | POA: Diagnosis not present

## 2022-07-18 DIAGNOSIS — E1122 Type 2 diabetes mellitus with diabetic chronic kidney disease: Secondary | ICD-10-CM | POA: Diagnosis not present

## 2022-07-18 DIAGNOSIS — E538 Deficiency of other specified B group vitamins: Secondary | ICD-10-CM | POA: Diagnosis not present

## 2022-07-18 DIAGNOSIS — N189 Chronic kidney disease, unspecified: Secondary | ICD-10-CM | POA: Diagnosis not present

## 2022-07-18 DIAGNOSIS — Z95828 Presence of other vascular implants and grafts: Secondary | ICD-10-CM

## 2022-07-18 DIAGNOSIS — Z87891 Personal history of nicotine dependence: Secondary | ICD-10-CM | POA: Diagnosis not present

## 2022-07-18 DIAGNOSIS — Z79899 Other long term (current) drug therapy: Secondary | ICD-10-CM | POA: Diagnosis not present

## 2022-07-18 DIAGNOSIS — I252 Old myocardial infarction: Secondary | ICD-10-CM | POA: Diagnosis not present

## 2022-07-18 LAB — CBC WITH DIFFERENTIAL (CANCER CENTER ONLY)
Abs Immature Granulocytes: 0.01 10*3/uL (ref 0.00–0.07)
Basophils Absolute: 0 10*3/uL (ref 0.0–0.1)
Basophils Relative: 0 %
Eosinophils Absolute: 0 10*3/uL (ref 0.0–0.5)
Eosinophils Relative: 1 %
HCT: 29.9 % — ABNORMAL LOW (ref 36.0–46.0)
Hemoglobin: 10.4 g/dL — ABNORMAL LOW (ref 12.0–15.0)
Immature Granulocytes: 0 %
Lymphocytes Relative: 38 %
Lymphs Abs: 1.9 10*3/uL (ref 0.7–4.0)
MCH: 32.3 pg (ref 26.0–34.0)
MCHC: 34.8 g/dL (ref 30.0–36.0)
MCV: 92.9 fL (ref 80.0–100.0)
Monocytes Absolute: 0.3 10*3/uL (ref 0.1–1.0)
Monocytes Relative: 6 %
Neutro Abs: 2.7 10*3/uL (ref 1.7–7.7)
Neutrophils Relative %: 55 %
Platelet Count: 265 10*3/uL (ref 150–400)
RBC: 3.22 MIL/uL — ABNORMAL LOW (ref 3.87–5.11)
RDW: 13 % (ref 11.5–15.5)
WBC Count: 5 10*3/uL (ref 4.0–10.5)
nRBC: 0 % (ref 0.0–0.2)

## 2022-07-18 LAB — CMP (CANCER CENTER ONLY)
ALT: 10 U/L (ref 0–44)
AST: 13 U/L — ABNORMAL LOW (ref 15–41)
Albumin: 4 g/dL (ref 3.5–5.0)
Alkaline Phosphatase: 70 U/L (ref 38–126)
Anion gap: 10 (ref 5–15)
BUN: 9 mg/dL (ref 8–23)
CO2: 25 mmol/L (ref 22–32)
Calcium: 8.5 mg/dL — ABNORMAL LOW (ref 8.9–10.3)
Chloride: 109 mmol/L (ref 98–111)
Creatinine: 1.16 mg/dL — ABNORMAL HIGH (ref 0.44–1.00)
GFR, Estimated: 48 mL/min — ABNORMAL LOW (ref >60)
Glucose, Bld: 119 mg/dL — ABNORMAL HIGH (ref 70–99)
Potassium: 3.9 mmol/L (ref 3.5–5.1)
Sodium: 144 mmol/L (ref 135–145)
Total Bilirubin: 0.3 mg/dL (ref 0.3–1.2)
Total Protein: 6.4 g/dL — ABNORMAL LOW (ref 6.5–8.1)

## 2022-07-18 MED ORDER — FAMOTIDINE IN NACL 20-0.9 MG/50ML-% IV SOLN
20.0000 mg | Freq: Once | INTRAVENOUS | Status: AC
  Administered 2022-07-18: 20 mg via INTRAVENOUS
  Filled 2022-07-18: qty 50

## 2022-07-18 MED ORDER — SODIUM CHLORIDE 0.9 % IV SOLN
16.0000 mg/kg | Freq: Once | INTRAVENOUS | Status: AC
  Administered 2022-07-18: 1300 mg via INTRAVENOUS
  Filled 2022-07-18: qty 60

## 2022-07-18 MED ORDER — SODIUM CHLORIDE 0.9 % IV SOLN: Freq: Once | INTRAVENOUS | Status: AC

## 2022-07-18 MED ORDER — SODIUM CHLORIDE 0.9 % IV SOLN: 60.0000 mg | Freq: Once | INTRAVENOUS | Status: DC

## 2022-07-18 MED ORDER — ACETAMINOPHEN 325 MG PO TABS
650.0000 mg | ORAL_TABLET | Freq: Once | ORAL | Status: AC
  Administered 2022-07-18: 650 mg via ORAL
  Filled 2022-07-18: qty 2

## 2022-07-18 MED ORDER — SODIUM CHLORIDE 0.9% FLUSH
10.0000 mL | Freq: Once | INTRAVENOUS | Status: AC
  Administered 2022-07-18: 10 mL

## 2022-07-18 MED ORDER — SODIUM CHLORIDE 0.9 % IV SOLN
16.0000 mg | Freq: Once | INTRAVENOUS | Status: AC
  Administered 2022-07-18: 16 mg via INTRAVENOUS
  Filled 2022-07-18: qty 1.6

## 2022-07-18 MED ORDER — DIPHENHYDRAMINE HCL 25 MG PO CAPS
50.0000 mg | ORAL_CAPSULE | Freq: Once | ORAL | Status: AC
  Administered 2022-07-18: 50 mg via ORAL
  Filled 2022-07-18: qty 2

## 2022-07-18 MED ORDER — CYANOCOBALAMIN 1000 MCG/ML IJ SOLN
1000.0000 ug | Freq: Once | INTRAMUSCULAR | Status: AC
  Administered 2022-07-18: 1000 ug via INTRAMUSCULAR
  Filled 2022-07-18: qty 1

## 2022-07-19 ENCOUNTER — Inpatient Hospital Stay: Payer: Medicare Other

## 2022-07-19 VITALS — BP 156/64 | HR 74 | Temp 98.2°F | Resp 17

## 2022-07-19 DIAGNOSIS — I252 Old myocardial infarction: Secondary | ICD-10-CM | POA: Diagnosis not present

## 2022-07-19 DIAGNOSIS — Z5112 Encounter for antineoplastic immunotherapy: Secondary | ICD-10-CM | POA: Diagnosis not present

## 2022-07-19 DIAGNOSIS — Z8673 Personal history of transient ischemic attack (TIA), and cerebral infarction without residual deficits: Secondary | ICD-10-CM | POA: Diagnosis not present

## 2022-07-19 DIAGNOSIS — C9000 Multiple myeloma not having achieved remission: Secondary | ICD-10-CM

## 2022-07-19 DIAGNOSIS — Z79899 Other long term (current) drug therapy: Secondary | ICD-10-CM | POA: Diagnosis not present

## 2022-07-19 DIAGNOSIS — Z95828 Presence of other vascular implants and grafts: Secondary | ICD-10-CM

## 2022-07-19 DIAGNOSIS — E1122 Type 2 diabetes mellitus with diabetic chronic kidney disease: Secondary | ICD-10-CM | POA: Diagnosis not present

## 2022-07-19 DIAGNOSIS — Z923 Personal history of irradiation: Secondary | ICD-10-CM | POA: Diagnosis not present

## 2022-07-19 DIAGNOSIS — E538 Deficiency of other specified B group vitamins: Secondary | ICD-10-CM | POA: Diagnosis not present

## 2022-07-19 DIAGNOSIS — E1140 Type 2 diabetes mellitus with diabetic neuropathy, unspecified: Secondary | ICD-10-CM | POA: Diagnosis not present

## 2022-07-19 DIAGNOSIS — C7951 Secondary malignant neoplasm of bone: Secondary | ICD-10-CM

## 2022-07-19 DIAGNOSIS — Z87891 Personal history of nicotine dependence: Secondary | ICD-10-CM | POA: Diagnosis not present

## 2022-07-19 DIAGNOSIS — N189 Chronic kidney disease, unspecified: Secondary | ICD-10-CM | POA: Diagnosis not present

## 2022-07-19 DIAGNOSIS — Z853 Personal history of malignant neoplasm of breast: Secondary | ICD-10-CM | POA: Diagnosis not present

## 2022-07-19 DIAGNOSIS — D509 Iron deficiency anemia, unspecified: Secondary | ICD-10-CM | POA: Diagnosis not present

## 2022-07-19 MED ORDER — HEPARIN SOD (PORK) LOCK FLUSH 100 UNIT/ML IV SOLN
500.0000 [IU] | Freq: Once | INTRAVENOUS | Status: AC
  Administered 2022-07-19: 500 [IU]

## 2022-07-19 MED ORDER — ALBUTEROL SULFATE (2.5 MG/3ML) 0.083% IN NEBU: 2.5000 mg | INHALATION_SOLUTION | Freq: Once | RESPIRATORY_TRACT | Status: DC | PRN

## 2022-07-19 MED ORDER — SODIUM CHLORIDE 0.9 % IV SOLN: Freq: Once | INTRAVENOUS | Status: DC | PRN

## 2022-07-19 MED ORDER — SODIUM CHLORIDE 0.9% FLUSH: 3.0000 mL | Freq: Once | INTRAVENOUS | Status: DC | PRN

## 2022-07-19 MED ORDER — METHYLPREDNISOLONE SODIUM SUCC 125 MG IJ SOLR: 125.0000 mg | Freq: Once | INTRAMUSCULAR | Status: DC | PRN

## 2022-07-19 MED ORDER — SODIUM CHLORIDE 0.9 % IV SOLN
60.0000 mg | Freq: Once | INTRAVENOUS | Status: DC
  Filled 2022-07-19: qty 20

## 2022-07-19 MED ORDER — SODIUM CHLORIDE 0.9 % IV SOLN
60.0000 mg | Freq: Once | INTRAVENOUS | Status: AC
  Administered 2022-07-19: 60 mg via INTRAVENOUS
  Filled 2022-07-19: qty 10

## 2022-07-19 MED ORDER — SODIUM CHLORIDE 0.9 % IV SOLN: INTRAVENOUS | Status: DC

## 2022-07-19 MED ORDER — DIPHENHYDRAMINE HCL 50 MG/ML IJ SOLN: 50.0000 mg | Freq: Once | INTRAMUSCULAR | Status: DC | PRN

## 2022-07-19 MED ORDER — ALTEPLASE 2 MG IJ SOLR: 2.0000 mg | Freq: Once | INTRAMUSCULAR | Status: DC | PRN

## 2022-07-19 MED ORDER — SODIUM CHLORIDE 0.9% FLUSH
10.0000 mL | Freq: Once | INTRAVENOUS | Status: AC
  Administered 2022-07-19: 10 mL

## 2022-07-19 MED ORDER — HEPARIN SOD (PORK) LOCK FLUSH 100 UNIT/ML IV SOLN: 250.0000 [IU] | Freq: Once | INTRAVENOUS | Status: DC | PRN

## 2022-07-19 MED ORDER — FAMOTIDINE IN NACL 20-0.9 MG/50ML-% IV SOLN: 20.0000 mg | Freq: Once | INTRAVENOUS | Status: DC | PRN

## 2022-07-19 MED ORDER — HEPARIN SOD (PORK) LOCK FLUSH 100 UNIT/ML IV SOLN: 500.0000 [IU] | Freq: Once | INTRAVENOUS | Status: DC | PRN

## 2022-07-19 MED ORDER — EPINEPHRINE 0.3 MG/0.3ML IJ SOAJ: 0.3000 mg | Freq: Once | INTRAMUSCULAR | Status: DC | PRN

## 2022-07-19 NOTE — Patient Instructions (Signed)
[facility]  Discharge Instructions: Thank you for choosing [facility] to provide your oncology and hematology care.   If you have a lab appointment with the [facility], please go directly to the [facility] and check in at the registration area.   Wear comfortable clothing and clothing appropriate for easy access to any Portacath or PICC line.   We strive to give you quality time with your provider. You may need to reschedule your appointment if you arrive late (15 or more minutes).  Arriving late affects you and other patients whose appointments are after yours.  Also, if you miss three or more appointments without notifying the office, you may be dismissed from the clinic at the provider's discretion.      For prescription refill requests, have your pharmacy contact our office and allow 72 hours for refills to be completed.    Today you received the following chemotherapy and/or immunotherapy agents: Aredia      To help prevent nausea and vomiting after your treatment, we encourage you to take your nausea medication as directed.  BELOW ARE SYMPTOMS THAT SHOULD BE REPORTED IMMEDIATELY: *FEVER GREATER THAN 100.4 F (38 C) OR HIGHER *CHILLS OR SWEATING *NAUSEA AND VOMITING THAT IS NOT CONTROLLED WITH YOUR NAUSEA MEDICATION *UNUSUAL SHORTNESS OF BREATH *UNUSUAL BRUISING OR BLEEDING *URINARY PROBLEMS (pain or burning when urinating, or frequent urination) *BOWEL PROBLEMS (unusual diarrhea, constipation, pain near the anus) TENDERNESS IN MOUTH AND THROAT WITH OR WITHOUT PRESENCE OF ULCERS (sore throat, sores in mouth, or a toothache) UNUSUAL RASH, SWELLING OR PAIN  UNUSUAL VAGINAL DISCHARGE OR ITCHING   Items with * indicate a potential emergency and should be followed up as soon as possible or go to the Emergency Department if any problems should occur.  Please show the CHEMOTHERAPY ALERT CARD or IMMUNOTHERAPY ALERT CARD at check-in to the  Emergency Department and triage nurse.  Should you have questions after your visit or need to cancel or reschedule your appointment, please contact [facility]  Dept: [phone]  and follow the prompts.  Office hours are 8:00 a.m. to 4:30 p.m. Monday - Friday. Please note that voicemails left after 4:00 p.m. may not be returned until the following business day.  We are closed weekends and major holidays. You have access to a nurse at all times for urgent questions. Please call the main number to the clinic Dept: [phone] and follow the prompts.   For any non-urgent questions, you may also contact your provider using MyChart. We now offer e-Visits for anyone 91 and older to request care online for non-urgent symptoms. For details visit [email or web address].   Also download the MyChart app! Go to the app store, search "MyChart", open the app, select [facility], and log in with your MyChart username and password.  Masks are optional in the cancer centers. If you would like for your care team to wear a mask while they are taking care of you, please let them know. You may have one support person who is at least 78 years old accompany you for your appointments.

## 2022-07-24 ENCOUNTER — Telehealth: Payer: Self-pay | Admitting: Hematology

## 2022-07-24 NOTE — Telephone Encounter (Signed)
Patient aware of upcoming appointments  

## 2022-08-02 ENCOUNTER — Telehealth: Payer: Self-pay | Admitting: *Deleted

## 2022-08-02 ENCOUNTER — Encounter: Payer: Self-pay | Admitting: *Deleted

## 2022-08-02 NOTE — Patient Instructions (Signed)
Visit Information  Thank you for taking time to visit with me today. Please don't hesitate to contact me if I can be of assistance to you.   Following are the goals we discussed today:   Goals Addressed               This Visit's Progress     COMPLETED: Late refills on medications (pt-stated)        Care Coordination Interventions: Advised patient to contact the provider 2-4 weeks prior to medication depletion to avoid absence of any ongoing prescribed medications. Verified pt has all her medications with no ongoing issues at this time. Reviewed medications with patient and discussed adherence with no refills needed at this time. Reviewed scheduled/upcoming provider appointments including sufficient transportation source Screening for signs and symptoms of depression related to chronic disease state  Assessed social determinant of health barriers Educated on care management services related to pharmacy. Social workers, and a Designer, jewellery. No needs to address at this time.         Please call the care guide team at [phone] if you need to cancel or reschedule your appointment.   If you are experiencing a Mental Health or [place] or need someone to talk to, please call the Suicide and Crisis Lifeline: 988  Patient verbalizes understanding of instructions and care plan provided today and agrees to view in [facility]. Active MyChart status and patient understanding of how to access instructions and care plan via MyChart confirmed with patient.     No further follow up required: No follow up needs.  Raina Mina, RN Care Management Coordinator [facility] Office [phone]

## 2022-08-02 NOTE — Patient Outreach (Signed)
  Care Coordination   Initial Visit Note   08/02/2022 Name: KARTER HAIRE MRN: [id number] DOB: [birth year]  Doneen Poisson Pasquarello is a 77 y.o. year old female who sees [name], Aundra Millet, MD for primary care. I spoke with  Doneen Poisson Bark by phone today.  What matters to the patients health and wellness today?  Late medication refills    Goals Addressed               This Visit's Progress     COMPLETED: Late refills on medications (pt-stated)        Care Coordination Interventions: Advised patient to contact the provider 2-4 weeks prior to medication depletion to avoid absence of any ongoing prescribed medications. Verified pt has all her medications with no ongoing issues at this time. Reviewed medications with patient and discussed adherence with no refills needed at this time. Reviewed scheduled/upcoming provider appointments including sufficient transportation source Screening for signs and symptoms of depression related to chronic disease state  Assessed social determinant of health barriers Educated on care management services related to pharmacy. Social workers, and a Designer, jewellery. No needs to address at this time.         SDOH assessments and interventions completed:  Yes  SDOH Interventions Today    Flowsheet Row Most Recent Value  SDOH Interventions   Food Insecurity Interventions Intervention Not Indicated  Housing Interventions Intervention Not Indicated  Transportation Interventions Intervention Not Indicated  Utilities Interventions Intervention Not Indicated        Care Coordination Interventions:  Yes, provided   Follow up plan: No further intervention required.   Encounter Outcome:  Pt. Visit Completed   Raina Mina, RN Care Management Coordinator [place] Office [phone]

## 2022-08-15 ENCOUNTER — Other Ambulatory Visit: Payer: Self-pay

## 2022-08-15 DIAGNOSIS — C9000 Multiple myeloma not having achieved remission: Secondary | ICD-10-CM

## 2022-08-16 ENCOUNTER — Inpatient Hospital Stay: Payer: Medicare Other | Admitting: Hematology

## 2022-08-16 ENCOUNTER — Other Ambulatory Visit: Payer: Self-pay

## 2022-08-16 ENCOUNTER — Inpatient Hospital Stay: Payer: Medicare Other | Attending: Hematology

## 2022-08-16 ENCOUNTER — Inpatient Hospital Stay: Payer: Medicare Other

## 2022-08-16 VITALS — BP 156/75 | HR 82 | Temp 97.2°F | Resp 18 | Wt 187.2 lb

## 2022-08-16 VITALS — BP 145/60 | HR 86 | Resp 14

## 2022-08-16 DIAGNOSIS — C7951 Secondary malignant neoplasm of bone: Secondary | ICD-10-CM | POA: Diagnosis not present

## 2022-08-16 DIAGNOSIS — Z853 Personal history of malignant neoplasm of breast: Secondary | ICD-10-CM | POA: Diagnosis not present

## 2022-08-16 DIAGNOSIS — I252 Old myocardial infarction: Secondary | ICD-10-CM | POA: Insufficient documentation

## 2022-08-16 DIAGNOSIS — Z7189 Other specified counseling: Secondary | ICD-10-CM | POA: Diagnosis not present

## 2022-08-16 DIAGNOSIS — E1140 Type 2 diabetes mellitus with diabetic neuropathy, unspecified: Secondary | ICD-10-CM | POA: Diagnosis not present

## 2022-08-16 DIAGNOSIS — E538 Deficiency of other specified B group vitamins: Secondary | ICD-10-CM | POA: Insufficient documentation

## 2022-08-16 DIAGNOSIS — Z87891 Personal history of nicotine dependence: Secondary | ICD-10-CM | POA: Diagnosis not present

## 2022-08-16 DIAGNOSIS — C9000 Multiple myeloma not having achieved remission: Secondary | ICD-10-CM

## 2022-08-16 DIAGNOSIS — Z79899 Other long term (current) drug therapy: Secondary | ICD-10-CM | POA: Insufficient documentation

## 2022-08-16 DIAGNOSIS — E1122 Type 2 diabetes mellitus with diabetic chronic kidney disease: Secondary | ICD-10-CM | POA: Insufficient documentation

## 2022-08-16 DIAGNOSIS — Z803 Family history of malignant neoplasm of breast: Secondary | ICD-10-CM | POA: Diagnosis not present

## 2022-08-16 DIAGNOSIS — Z5112 Encounter for antineoplastic immunotherapy: Secondary | ICD-10-CM | POA: Diagnosis not present

## 2022-08-16 DIAGNOSIS — Z923 Personal history of irradiation: Secondary | ICD-10-CM | POA: Diagnosis not present

## 2022-08-16 DIAGNOSIS — N189 Chronic kidney disease, unspecified: Secondary | ICD-10-CM | POA: Diagnosis not present

## 2022-08-16 DIAGNOSIS — G2581 Restless legs syndrome: Secondary | ICD-10-CM | POA: Insufficient documentation

## 2022-08-16 DIAGNOSIS — Z8673 Personal history of transient ischemic attack (TIA), and cerebral infarction without residual deficits: Secondary | ICD-10-CM | POA: Diagnosis not present

## 2022-08-16 DIAGNOSIS — Z800 Family history of malignant neoplasm of digestive organs: Secondary | ICD-10-CM | POA: Diagnosis not present

## 2022-08-16 DIAGNOSIS — Z95828 Presence of other vascular implants and grafts: Secondary | ICD-10-CM

## 2022-08-16 DIAGNOSIS — D509 Iron deficiency anemia, unspecified: Secondary | ICD-10-CM | POA: Diagnosis not present

## 2022-08-16 LAB — CMP (CANCER CENTER ONLY)
ALT: 10 U/L (ref 0–44)
AST: 13 U/L — ABNORMAL LOW (ref 15–41)
Albumin: 4 g/dL (ref 3.5–5.0)
Alkaline Phosphatase: 71 U/L (ref 38–126)
Anion gap: 10 (ref 5–15)
BUN: 16 mg/dL (ref 8–23)
CO2: 26 mmol/L (ref 22–32)
Calcium: 9.4 mg/dL (ref 8.9–10.3)
Chloride: 104 mmol/L (ref 98–111)
Creatinine: 1.25 mg/dL — ABNORMAL HIGH (ref 0.44–1.00)
GFR, Estimated: 44 mL/min — ABNORMAL LOW (ref >60)
Glucose, Bld: 119 mg/dL — ABNORMAL HIGH (ref 70–99)
Potassium: 4.1 mmol/L (ref 3.5–5.1)
Sodium: 140 mmol/L (ref 135–145)
Total Bilirubin: 0.2 mg/dL — ABNORMAL LOW (ref 0.3–1.2)
Total Protein: 6.2 g/dL — ABNORMAL LOW (ref 6.5–8.1)

## 2022-08-16 LAB — CBC WITH DIFFERENTIAL (CANCER CENTER ONLY)
Abs Immature Granulocytes: 0.01 10*3/uL (ref 0.00–0.07)
Basophils Absolute: 0 10*3/uL (ref 0.0–0.1)
Basophils Relative: 1 %
Eosinophils Absolute: 0 10*3/uL (ref 0.0–0.5)
Eosinophils Relative: 1 %
HCT: 29.7 % — ABNORMAL LOW (ref 36.0–46.0)
Hemoglobin: 10.3 g/dL — ABNORMAL LOW (ref 12.0–15.0)
Immature Granulocytes: 0 %
Lymphocytes Relative: 34 %
Lymphs Abs: 1.7 10*3/uL (ref 0.7–4.0)
MCH: 32 pg (ref 26.0–34.0)
MCHC: 34.7 g/dL (ref 30.0–36.0)
MCV: 92.2 fL (ref 80.0–100.0)
Monocytes Absolute: 0.3 10*3/uL (ref 0.1–1.0)
Monocytes Relative: 7 %
Neutro Abs: 2.9 10*3/uL (ref 1.7–7.7)
Neutrophils Relative %: 57 %
Platelet Count: 282 10*3/uL (ref 150–400)
RBC: 3.22 MIL/uL — ABNORMAL LOW (ref 3.87–5.11)
RDW: 12.7 % (ref 11.5–15.5)
WBC Count: 5 10*3/uL (ref 4.0–10.5)
nRBC: 0 % (ref 0.0–0.2)

## 2022-08-16 MED ORDER — SODIUM CHLORIDE 0.9 % IV SOLN
16.0000 mg | Freq: Once | INTRAVENOUS | Status: AC
  Administered 2022-08-16: 16 mg via INTRAVENOUS
  Filled 2022-08-16: qty 1.6

## 2022-08-16 MED ORDER — DIPHENHYDRAMINE HCL 25 MG PO CAPS
50.0000 mg | ORAL_CAPSULE | Freq: Once | ORAL | Status: AC
  Administered 2022-08-16: 50 mg via ORAL
  Filled 2022-08-16: qty 2

## 2022-08-16 MED ORDER — CYANOCOBALAMIN 1000 MCG/ML IJ SOLN
1000.0000 ug | Freq: Once | INTRAMUSCULAR | Status: AC
  Administered 2022-08-16: 1000 ug via INTRAMUSCULAR
  Filled 2022-08-16: qty 1

## 2022-08-16 MED ORDER — SODIUM CHLORIDE 0.9% FLUSH
10.0000 mL | INTRAVENOUS | Status: DC | PRN
  Administered 2022-08-16: 10 mL

## 2022-08-16 MED ORDER — HEPARIN SOD (PORK) LOCK FLUSH 100 UNIT/ML IV SOLN
500.0000 [IU] | Freq: Once | INTRAVENOUS | Status: AC | PRN
  Administered 2022-08-16: 500 [IU]

## 2022-08-16 MED ORDER — FAMOTIDINE IN NACL 20-0.9 MG/50ML-% IV SOLN
20.0000 mg | Freq: Once | INTRAVENOUS | Status: AC
  Administered 2022-08-16: 20 mg via INTRAVENOUS
  Filled 2022-08-16: qty 50

## 2022-08-16 MED ORDER — ACETAMINOPHEN 325 MG PO TABS
650.0000 mg | ORAL_TABLET | Freq: Once | ORAL | Status: AC
  Administered 2022-08-16: 650 mg via ORAL
  Filled 2022-08-16: qty 2

## 2022-08-16 MED ORDER — SODIUM CHLORIDE 0.9% FLUSH
10.0000 mL | Freq: Once | INTRAVENOUS | Status: AC
  Administered 2022-08-16: 10 mL

## 2022-08-16 MED ORDER — SODIUM CHLORIDE 0.9 % IV SOLN: Freq: Once | INTRAVENOUS | Status: AC

## 2022-08-16 MED ORDER — SODIUM CHLORIDE 0.9 % IV SOLN
16.0000 mg/kg | Freq: Once | INTRAVENOUS | Status: AC
  Administered 2022-08-16: 1300 mg via INTRAVENOUS
  Filled 2022-08-16: qty 60

## 2022-08-16 NOTE — Progress Notes (Signed)
[facility] Cancer Follow up:   DOS 08/16/22   [name], MD [address] [place] [address]  CC: Follow-up for continued evaluation and management of multiple myeloma  SUMMARY OF ONCOLOGIC HISTORY: Oncology History  History of right breast cancer  04/16/2000 Surgery   Left breast: Triple negative  invasive ductal cancer treated with lumpectomy, adjuvant chemotherapy, radiation , in [place], unknown stage   06/07/2015 Mammogram   Right breast mass 6x 6 x 5 mm, right axillary lymph node with slight cortex thickening measured 5 mm    06/13/2015 Initial Diagnosis   Right breast needle biopsy: Invasive ductal carcinoma, grade 1, right axillary lymph node biopsy negative , ER 95%, PR 5%, Ki-67 10%, HER-2 negative   06/13/2015 Clinical Stage   Stage IA: T1b N0   07/07/2015 Surgery   Right lumpectomy: Invasive ductal carcinoma grade 1, 1 cm span, with low-grade DCIS, DCIS focally 0.1 cm to inferior margin, 0/3 lymph nodes negative, ER 95%, PR 5%, HER-2 negative ratio 1.1, Ki-67 10%   07/07/2015 Pathologic Stage   Stage IA: T1c N0   07/13/2015 Procedure   Breast High/Moderate Risk Panel reveals no clinically significant variant at ATM, BRCA1, BRCA2, CDH1, CHEK2, PALB2, PTEN, and TP53.     08/23/2015 - 09/21/2015 Radiation Therapy   Adjuvant Radiation: Right breast/ 42.5Gy at 2.5 Gy per fraction x 17 fractions.   Right breast boost/ 7.5 Gy at 2.5 Gy per fraction x 3 fractions    Anti-estrogen oral therapy   Patient refused antiestrogen therapy   10/20/2015 Survivorship   Survivorship care plan completed and mailed to patient in lieu of in person visit at her request   Iron deficiency anemia  Multiple myeloma (HCC)  01/25/2020 Initial Diagnosis   Multiple myeloma (HCC)   02/11/2020 - 05/19/2020 Adjuvant Chemotherapy   Daratumumab Weekly    02/29/2020 - 03/13/2020 Radiation Therapy   The targets were treated to a total dose of 20 Gy in 10 fractions of  2 Gy each to the left hip and L5 using one plan.   06/02/2020 - 10/05/2020 Adjuvant Chemotherapy   Daratumumab and Carfilzomib   10/19/2020 -  Adjuvant Chemotherapy   Maintenance Daratumumab--initially every 2 weeks, then every 4 weeks.    05/24/2022 -  Chemotherapy   Patient is on Treatment Plan : MYELOMA Daratumumab IV q28d     Malignant neoplasm metastatic to bone (HCC)  02/08/2021 Initial Diagnosis   Bone metastases (HCC)   05/24/2022 -  Chemotherapy   Patient is on Treatment Plan : MYELOMA Daratumumab IV q28d       CURRENT THERAPY: Daratumumab/B12 injection   INTERVAL HISTORY:  Tracy [name] i is here for continued evaluation and management of multiple myeloma.  She was last seen by me on 05/24/22 and she was doing well overall with no new medical concerns.  Today, reports that she recently was seen by a neurologist for her restless legs and neuropathy. Her Gabapentin dose has increased -she takes 300 mg twice in the morning and 600 mg at night. She complains of swimmy headedness.  No infection issues. Patient is UTD on their vaccinations, including RSV, influenza, and COVID-19 booster. She has been tolerating the Daratumumab well and her response has been stable.  She denies any pain in the abdomen or change in bowel habits.  She will be seeing a Gastroenterologist soon due to nausea symptoms when consuming food. Her sense of fullness does not last longer  than usual.  Patient Active Problem List   Diagnosis Date Noted   RLS (restless legs syndrome) 06/12/2022   Malignant neoplasm metastatic to bone (HCC) 02/08/2021   Angiodysplasia of intestine 08/23/2020   Port-A-Cath in place 08/04/2020   Counseling regarding advance care planning and goals of care 02/07/2020   Multiple myeloma (HCC) 01/25/2020   Dizziness 10/12/2019   Post-nasal drainage 10/12/2019   Allergic rhinitis 08/19/2018   Type 2 diabetes mellitus with diabetic neuropathy, unspecified (HCC) 11/05/2017    Encounter for long-term use of muscle relaxants 09/24/2017   CAD in native artery 09/24/2017   OSA (obstructive sleep apnea) 09/24/2017   Snorings 09/24/2017   Asthenia 06/30/2017   Morbid obesity (HCC) 06/02/2017   Depression 06/02/2017   Iron deficiency anemia 09/23/2016   Anemia of chronic disease 09/28/2015   Genetic testing 08/21/2015   History of left breast cancer 07/13/2015   History of right breast cancer 06/20/2015   Hearing loss due to cerumen impaction 12/29/2014   Allergy to adhesive tape 05/19/2014   Hyperlipidemia 12/06/2013   GERD (gastroesophageal reflux disease) 12/06/2013   Cervical disc disease 11/11/2013   Osteopenia 05/25/2013   Allergic asthma 12/24/2012   Insomnia 04/02/2012   Physical exam, annual 04/02/2012   HTN (hypertension) 02/03/2012   Vertigo, benign positional 02/03/2012   Left groin pain 02/03/2012   Hip pain 10/10/2011   TIA (transient ischemic attack) 07/24/2011   Allergic reaction 07/05/2011   Osteoarthrosis involving lower leg 10/19/2010   Disorder of bone and cartilage 05/01/2010   Generalized anxiety disorder 05/01/2010   Vitamin D deficiency 02/20/2010   Unspecified chronic bronchitis (HCC) 08/24/2009   Other lymphedema 10/29/2007    is allergic to bacitracin-neomycin-polymyxin  [neomycin-bacitracin zn-polymyx], nsaids, tape, ambien [zolpidem tartrate], amoxicillin, clavulanic acid, contrast media [iodinated contrast media], latex, prednisone, and tessalon [benzonatate].  MEDICAL HISTORY: Past Medical History:  Diagnosis Date   Angiodysplasia of intestine 08/23/2020   Anxiety    Arthritis    Breast cancer (HCC) 06/13/15   Cancer (HCC) 2000   breast cancer   Chronic bronchitis (HCC)    Chronic bronchitis (HCC)    Hyperlipidemia    Hypertension    Myocardial infarction [facility]) 2001   Personal history of radiation therapy    Restless leg    Stroke (HCC) 2004   TIA, no deficits    SURGICAL HISTORY: Past Surgical History:   Procedure Laterality Date   ABDOMINAL HYSTERECTOMY  1985   BREAST LUMPECTOMY Left 2000   radiation and chemo   BREAST LUMPECTOMY Right 2016   radiation   BREAST SURGERY  2001   lt breast lumpectomy   COLONOSCOPY WITH ESOPHAGOGASTRODUODENOSCOPY (EGD)  04/2020   ENTEROSCOPY N/A 03/15/2021   Procedure: ENTEROSCOPY;  Surgeon: Rachael Fee, MD;  Location: [facility];  Service: Endoscopy;  Laterality: N/A;   GIVENS CAPSULE STUDY  07/2020   HOT HEMOSTASIS N/A 03/15/2021   Procedure: HOT HEMOSTASIS (ARGON PLASMA COAGULATION/BICAP);  Surgeon: Rachael Fee, MD;  Location: [facility];  Service: Endoscopy;  Laterality: N/A;   IR IMAGING GUIDED PORT INSERTION  04/25/2020   RADIOACTIVE SEED GUIDED PARTIAL MASTECTOMY WITH AXILLARY SENTINEL LYMPH NODE BIOPSY Right 07/07/2015   Procedure: RIGHT RADIOACTIVE SEED GUIDED PARTIAL MASTECTOMY WITH AXILLARY SENTINEL LYMPH NODE BIOPSY;  Surgeon: Chevis Pretty III, MD;  Location: [facility];  Service: General;  Laterality: Right;   SMALL INTESTINE SURGERY     TUBAL LIGATION     UPPER GASTROINTESTINAL ENDOSCOPY  SOCIAL HISTORY: Social History   Socioeconomic History   Marital status: Divorced    Spouse name: Not on file   Number of children: 7   Years of education: Not on file   Highest education level: Not on file  Occupational History   Occupation: retired  Tobacco Use   Smoking status: Former    Packs/day: 1.00    Years: 20.00    Total pack years: 20.00    Types: Cigarettes    Quit date: 07/30/2011    Years since quitting: 11.0   Smokeless tobacco: Never   Tobacco comments:    Quit >4 years ago; 1 ppd for about 5/20 years (remaining was less)  Vaping Use   Vaping Use: Former  Substance and Sexual Activity   Alcohol use: No    Alcohol/week: 0.0 standard drinks of alcohol   Drug use: No   Sexual activity: Not Currently  Other Topics Concern   Not on file  Social History Narrative   Lives alone.  Retired.   Education:  11th grade GED.  Children:  7 (one here).    Social Determinants of Health   Financial Resource Strain: High Risk (12/21/2021)   Overall Financial Resource Strain (CARDIA)    Difficulty of Paying Living Expenses: Hard  Food Insecurity: No Food Insecurity (08/02/2022)   Hunger Vital Sign    Worried About Running Out of Food in the Last Year: Never true    Ran Out of Food in the Last Year: Never true  Transportation Needs: No Transportation Needs (08/02/2022)   PRAPARE - Administrator, Civil Service (Medical): No    Lack of Transportation (Non-Medical): No  Physical Activity: Inactive (12/20/2021)   Exercise Vital Sign    Days of Exercise per Week: 0 days    Minutes of Exercise per Session: 0 min  Stress: No Stress Concern Present (12/20/2021)   [employer] of Occupational Health - Occupational Stress Questionnaire    Feeling of Stress : Not at all  Social Connections: Moderately Integrated (12/20/2021)   Social Connection and Isolation Panel [NHANES]    Frequency of Communication with Friends and Family: More than three times a week    Frequency of Social Gatherings with Friends and Family: More than three times a week    Attends Religious Services: More than 4 times per year    Active Member of [employer] or Organizations: Yes    Attends Engineer, structural: More than 4 times per year    Marital Status: Divorced  Intimate Partner Violence: Not At Risk (12/20/2021)   Humiliation, Afraid, Rape, and Kick questionnaire    Fear of Current or Ex-Partner: No    Emotionally Abused: No    Physically Abused: No    Sexually Abused: No    FAMILY HISTORY: Family History  Problem Relation Age of Onset   Emphysema Mother 57       smoker   Diabetes Father    Lung cancer Sister        dx. <50; former smoker   Diabetes Brother    Diabetes Brother    Brain cancer Brother 86       unknown tumor type   Diabetes Paternal Aunt    Stroke Maternal Grandmother     Diabetes Paternal Grandmother    Cancer Daughter 45       neck cancer   Other Daughter        hysterectomy for unspecified reason   Colon cancer Daughter  Breast cancer Cousin    Cancer Cousin        unspecified type   Breast cancer Other        triple negative breast cancer in her 49s   Colon polyps Neg Hx    Esophageal cancer Neg Hx    Gallbladder disease Neg Hx    ROS 10 Point review of Systems was done is negative except as noted above.   PHYSICAL EXAMINATION  ECOG PERFORMANCE STATUS: 1 - Symptomatic but completely ambulatory  Vitals:   08/16/22 1050  BP: (!) 156/75  Pulse: 82  Resp: 18  Temp: (!) 97.2 F (36.2 C)  SpO2: 100%    GENERAL:alert, in no acute distress and comfortable SKIN: no acute rashes, no significant lesions EYES: conjunctiva are pink and non-injected, sclera anicteric OROPHARYNX: MMM, no exudates, no oropharyngeal erythema or ulceration NECK: supple, no JVD LYMPH:  no palpable lymphadenopathy in the cervical, axillary or inguinal regions LUNGS: clear to auscultation b/l with normal respiratory effort HEART: regular rate & rhythm ABDOMEN:  normoactive bowel sounds , non tender, not distended. Extremity: no pedal edema PSYCH: alert & oriented x 3 with fluent speech NEURO: no focal motor/sensory deficits   LABORATORY DATA: .    Latest Ref Rng & Units 08/16/2022   10:07 AM 07/18/2022   10:45 AM 06/28/2022    9:44 AM  CBC  WBC 4.0 - 10.5 K/uL 5.0  5.0  5.2   Hemoglobin 12.0 - 15.0 g/dL 40.9  81.1  91.4   Hematocrit 36.0 - 46.0 % 29.7  29.9  30.2   Platelets 150 - 400 K/uL 282  265  273    .    Latest Ref Rng & Units 08/16/2022   10:07 AM 07/18/2022   10:45 AM 06/28/2022    9:44 AM  CMP  Glucose 70 - 99 mg/dL 782  956  213   BUN 8 - 23 mg/dL 16  9  9    Creatinine 0.44 - 1.00 mg/dL 0.86  5.78  4.69   Sodium 135 - 145 mmol/L 140  144  142   Potassium 3.5 - 5.1 mmol/L 4.1  3.9  3.9   Chloride 98 - 111 mmol/L 104  109  109   CO2 22 -  32 mmol/L 26  25  25    Calcium 8.9 - 10.3 mg/dL 9.4  8.5  9.3   Total Protein 6.5 - 8.1 g/dL 6.2  6.4  6.9   Total Bilirubin 0.3 - 1.2 mg/dL 0.2  0.3  0.3   Alkaline Phos 38 - 126 U/L 71  70  71   AST 15 - 41 U/L 13  13  13    ALT 0 - 44 U/L 10  10  10     ASSESSMENT and THERAPY PLAN:   77 yo here for follow-up of her for follow-up of her multiple myeloma   1)  IgG Kappa Multiple myeloma with bone lesions, anemia, renal insuff. M spike @ 3.7g/dl on diagnosis. 1p deletion, polymorphic variant, 13q deletion Multiple myeloma panel from 06/28/2021 with no M spike.  IFE positive for IgG kappa monoclonal protein possibly from her daratumumab. Currently on monthly daratumumab 2) h/o DM2 3) Diabetic Neuropathy 4) CKD - likely from DM2, but could have an element of myeloma kidney. 5) h/o TIA and AMI 6) Iron deficiency 7) B12 deficiency   PLAN:  -discussed lab results on 08/16/22 with patient. CBC showed WBC of 5 K, hemoglobin of 10.3 K, and platelets of  282 K. CMP showed stable chronic kidney disease, otherwise okay. -12/1 myeloma panel showed no m-spike protein, negative IFE, in remission -no notably toxicities from current treatment. -she will continue monthly daratumumab- orders reviewed and signed. -continue monthly B12   Follow-up: Per integrated scheduling  The total time spent in the appointment was 30 minutes* .  All of the patient's questions were answered with apparent satisfaction. The patient knows to call the clinic with any problems, questions or concerns.   Wyvonnia Lora MD MS AAHIVMS [facility] Hematology/Oncology Physician [facility]  .*Total Encounter Time as defined by the Centers for Medicare and Medicaid Services includes, in addition to the face-to-face time of a patient visit (documented in the note above) non-face-to-face time: obtaining and reviewing outside history, ordering and reviewing medications, tests or procedures, care coordination  (communications with other health care professionals or caregivers) and documentation in the medical record.    I,Mitra Faeizi,acting as a Neurosurgeon for Wyvonnia Lora, MD.,have documented all relevant documentation on the behalf of Wyvonnia Lora, MD,as directed by  Wyvonnia Lora, MD while in the presence of Wyvonnia Lora, MD.  .I have reviewed the above documentation for accuracy and completeness, and I agree with the above. Johney Maine MD

## 2022-08-16 NOTE — Progress Notes (Signed)
Patient seen by MD today  Vitals are within treatment parameters.  Labs reviewed: and are within treatment parameters."  Per physician team, patient is ready for treatment and there are NO modifications to the treatment plan.

## 2022-08-16 NOTE — Patient Instructions (Signed)
[facility]  Discharge Instructions: Thank you for choosing [facility] to provide your oncology and hematology care.   If you have a lab appointment with the Cancer Center, please go directly to the Cancer Center and check in at the registration area.   Wear comfortable clothing and clothing appropriate for easy access to any Portacath or PICC line.   We strive to give you quality time with your provider. You may need to reschedule your appointment if you arrive late (15 or more minutes).  Arriving late affects you and other patients whose appointments are after yours.  Also, if you miss three or more appointments without notifying the office, you may be dismissed from the clinic at the provider's discretion.      For prescription refill requests, have your pharmacy contact our office and allow 72 hours for refills to be completed.    Today you received the following chemotherapy and/or immunotherapy agents: daratumumab      To help prevent nausea and vomiting after your treatment, we encourage you to take your nausea medication as directed.  BELOW ARE SYMPTOMS THAT SHOULD BE REPORTED IMMEDIATELY: *FEVER GREATER THAN 100.4 F (38 C) OR HIGHER *CHILLS OR SWEATING *NAUSEA AND VOMITING THAT IS NOT CONTROLLED WITH YOUR NAUSEA MEDICATION *UNUSUAL SHORTNESS OF BREATH *UNUSUAL BRUISING OR BLEEDING *URINARY PROBLEMS (pain or burning when urinating, or frequent urination) *BOWEL PROBLEMS (unusual diarrhea, constipation, pain near the anus) TENDERNESS IN MOUTH AND THROAT WITH OR WITHOUT PRESENCE OF ULCERS (sore throat, sores in mouth, or a toothache) UNUSUAL RASH, SWELLING OR PAIN  UNUSUAL VAGINAL DISCHARGE OR ITCHING   Items with * indicate a potential emergency and should be followed up as soon as possible or go to the Emergency Department if any problems should occur.  Please show the CHEMOTHERAPY ALERT CARD or IMMUNOTHERAPY ALERT CARD at  check-in to the Emergency Department and triage nurse.  Should you have questions after your visit or need to cancel or reschedule your appointment, please contact [facility]  Dept: [phone]  and follow the prompts.  Office hours are 8:00 a.m. to 4:30 p.m. Monday - Friday. Please note that voicemails left after 4:00 p.m. may not be returned until the following business day.  We are closed weekends and major holidays. You have access to a nurse at all times for urgent questions. Please call the main number to the clinic Dept: [phone] and follow the prompts.   For any non-urgent questions, you may also contact your provider using MyChart. We now offer e-Visits for anyone 18 and older to request care online for non-urgent symptoms. For details visit [email or web address].   Also download the MyChart app! Go to the app store, search "MyChart", open the app, select , and log in with your MyChart username and password.  

## 2022-08-18 ENCOUNTER — Other Ambulatory Visit: Payer: Self-pay | Admitting: Hematology

## 2022-08-18 DIAGNOSIS — C7951 Secondary malignant neoplasm of bone: Secondary | ICD-10-CM

## 2022-08-21 LAB — MULTIPLE MYELOMA PANEL, SERUM
Albumin SerPl Elph-Mcnc: 3.7 g/dL (ref 2.9–4.4)
Albumin/Glob SerPl: 1.5 (ref 0.7–1.7)
Alpha 1: 0.2 g/dL (ref 0.0–0.4)
Alpha2 Glob SerPl Elph-Mcnc: 1.1 g/dL — ABNORMAL HIGH (ref 0.4–1.0)
B-Globulin SerPl Elph-Mcnc: 0.9 g/dL (ref 0.7–1.3)
Gamma Glob SerPl Elph-Mcnc: 0.4 g/dL (ref 0.4–1.8)
Globulin, Total: 2.6 g/dL (ref 2.2–3.9)
IgA: 67 mg/dL (ref 64–422)
IgG (Immunoglobin G), Serum: 405 mg/dL — ABNORMAL LOW (ref 586–1602)
IgM (Immunoglobulin M), Srm: 42 mg/dL (ref 26–217)
Total Protein ELP: 6.3 g/dL (ref 6.0–8.5)

## 2022-08-22 ENCOUNTER — Encounter: Payer: Self-pay | Admitting: Hematology

## 2022-08-22 ENCOUNTER — Other Ambulatory Visit: Payer: Self-pay | Admitting: Family Medicine

## 2022-09-10 ENCOUNTER — Telehealth: Payer: Self-pay | Admitting: Pharmacist

## 2022-09-10 NOTE — Progress Notes (Signed)
Care Management & Coordination Services Pharmacy Team   Reason for Encounter: Appointment Reminder  Contacted patient to confirm telephone appointment with [name], PharmD on 09/11/22 at 1:45PM. Unsuccessful outreach. Left voicemail for patient to return call.   Future Appointments  Date Time Provider [place]  09/11/2022  1:45 PM Edythe Clarity, [place] [facility] None  09/13/2022 11:00 AM [facility] [place] [facility] None  09/13/2022 12:00 PM [facility] INFUSION [facility] None  10/11/2022 10:30 AM [facility] [place] [facility] None  10/11/2022 11:00 AM [name], MD [facility] None  10/11/2022 12:00 PM [facility] INFUSION [facility] None  11/08/2022  9:45 AM [facility] [place] [facility] None  11/08/2022 10:20 AM Dede Query T, PA-C [facility] None  11/08/2022 11:30 AM [facility] INFUSION [facility] None  12/26/2022  2:30 PM [facility] [place]

## 2022-09-11 ENCOUNTER — Ambulatory Visit: Payer: Medicare Other | Admitting: Pharmacist

## 2022-09-11 NOTE — Progress Notes (Signed)
Care Management & Coordination Services Pharmacy Note  09/11/2022 Name:  Tracy [name] MRN:  [id number] DOB:  1946/07/19  Summary: PharmD FU visit.  A1c improved, she reports she started back on her metformin on her own.  Noted some higher sugars so she wanted to start back.  Use with caution with GFR.  BP high last few OV's however, it has been normal at home.  Recommendations/Changes made from today's visit: No changes follow BP  Follow up plan: FU with me 6 months CMA BP check 3 mo   Subjective: Tracy [name] is an 77 y.o. year old female who is a primary patient of [name], Aundra Millet, MD.  The care coordination team was consulted for assistance with disease management and care coordination needs.    Engaged with patient by telephone for follow up visit.  Recent office visits:  None noted.    Recent consult visits:  None noted.    Hospital visits:  None in previous 6 months   Objective:  Lab Results  Component Value Date   CREATININE 1.25 (H) 08/16/2022   BUN 16 08/16/2022   GFR 47.20 (L) 06/12/2022   EGFR 71 (L) 07/13/2015   GFRNONAA 44 (L) 08/16/2022   GFRAA 41 (L) 04/21/2020   NA 140 08/16/2022   K 4.1 08/16/2022   CALCIUM 9.4 08/16/2022   CO2 26 08/16/2022   GLUCOSE 119 (H) 08/16/2022    Lab Results  Component Value Date/Time   HGBA1C 6.5 06/12/2022 02:01 PM   HGBA1C 7.0 (H) 09/24/2021 10:36 AM   GFR 47.20 (L) 06/12/2022 02:01 PM   GFR 47.97 (L) 09/03/2021 10:57 AM   MICROALBUR 1.9 06/12/2022 02:01 PM    Last diabetic Eye exam:  Lab Results  Component Value Date/Time   HMDIABEYEEXA No Retinopathy 05/20/2019 12:00 AM    Last diabetic Foot exam: No results found for: "HMDIABFOOTEX"   Lab Results  Component Value Date   CHOL 179 06/12/2022   HDL 66.20 06/12/2022   LDLCALC 83 06/12/2022   LDLDIRECT 154.7 04/05/2013   TRIG 149.0 06/12/2022   CHOLHDL 3 06/12/2022       Latest Ref Rng & Units 08/16/2022   10:07 AM 07/18/2022   10:45 AM  06/28/2022    9:44 AM  Hepatic Function  Total Protein 6.5 - 8.1 g/dL 6.2  6.4  6.9   Albumin 3.5 - 5.0 g/dL 4.0  4.0  4.4   AST 15 - 41 U/L 13  13  13   $ ALT 0 - 44 U/L 10  10  10   $ Alk Phosphatase 38 - 126 U/L 71  70  71   Total Bilirubin 0.3 - 1.2 mg/dL 0.2  0.3  0.3     Lab Results  Component Value Date/Time   TSH 1.72 06/12/2022 02:01 PM   TSH 0.92 09/03/2021 10:57 AM       Latest Ref Rng & Units 08/16/2022   10:07 AM 07/18/2022   10:45 AM 06/28/2022    9:44 AM  CBC  WBC 4.0 - 10.5 K/uL 5.0  5.0  5.2   Hemoglobin 12.0 - 15.0 g/dL 10.3  10.4  10.5   Hematocrit 36.0 - 46.0 % 29.7  29.9  30.2   Platelets 150 - 400 K/uL 282  265  273     Lab Results  Component Value Date/Time   VD25OH 74.79 09/24/2021 10:36 AM   VD25OH 71.18 04/10/2021 10:06 AM   VITAMINB12 1,359 (H) 09/03/2021 10:57 AM  [id number] 2,059 (H) 05/03/2021 10:26 AM    Clinical ASCVD: Yes  The ASCVD Risk score (Arnett DK, et al., 2019) failed to calculate for the following reasons:   The patient has a prior MI or stroke diagnosis        08/02/2022   11:46 AM 06/12/2022    1:24 PM 12/20/2021    2:42 PM  Depression screen PHQ 2/9  Decreased Interest 0 1 0  Down, Depressed, Hopeless 0 0 0  PHQ - 2 Score 0 1 0  Altered sleeping  3   Tired, decreased energy  3   Change in appetite  0   Feeling bad or failure about yourself   0   Trouble concentrating  0   Moving slowly or fidgety/restless  0   Suicidal thoughts  0   PHQ-9 Score  7   Difficult doing work/chores  Somewhat difficult      Social History   Tobacco Use  Smoking Status Former   Packs/day: 1.00   Years: 20.00   Total pack years: 20.00   Types: Cigarettes   Quit date: 07/30/2011   Years since quitting: 11.1  Smokeless Tobacco Never  Tobacco Comments   Quit >4 years ago; 1 ppd for about 5/20 years (remaining was less)   BP Readings from Last 3 Encounters:  08/16/22 (!) 145/60  08/16/22 (!) 156/75  07/19/22 (!) 156/64   Pulse  Readings from Last 3 Encounters:  08/16/22 86  08/16/22 82  07/19/22 74   Wt Readings from Last 3 Encounters:  08/16/22 187 lb 3.2 oz (84.9 kg)  07/18/22 187 lb (84.8 kg)  07/15/22 188 lb (85.3 kg)   BMI Readings from Last 3 Encounters:  08/16/22 36.56 kg/m  07/18/22 36.52 kg/m  07/15/22 36.72 kg/m    Allergies  Allergen Reactions   Bacitracin-Neomycin-Polymyxin  [Neomycin-Bacitracin Zn-Polymyx] Swelling   Nsaids Other (See Comments)    nosebleeds   Tape Hives    Pt cannot tolerate bandaids, tape, or any other adhesives.    Ambien [Zolpidem Tartrate]     Hives    Amoxicillin     Rash    Clavulanic Acid Hives   Contrast Media [Iodinated Contrast Media] Hives    Pt states hives with prior ct, was given benadryl to resolve   Latex Swelling   Prednisone Swelling    Pt tolerates Dexamethasone. Throat swelling   Tessalon [Benzonatate] Itching and Rash    Medications Reviewed Today     Reviewed by Tobi Bastos, RN (Registered Nurse) on 08/02/22 at 1144  Med List Status: <None>   Medication Order Taking? Sig Documenting Provider Last Dose Status Informant  acyclovir (ZOVIRAX) 400 MG tablet [id number] No Take 1 tablet (400 mg total) by mouth 2 (two) times daily. [name], MD Taking Active   atorvastatin (LIPITOR) 10 MG tablet [id number] No TAKE 1 TABLET BY MOUTH DAILY [name], MD Taking Active   azelastine (ASTELIN) 0.1 % nasal spray [id number] No Place 1 spray into both nostrils 2 (two) times daily. Use in each nostril as directed [name], NP Taking Active   gabapentin (NEURONTIN) 300 MG capsule [id number] No Take 2 capsules (600 mg total) by mouth 3 (three) times daily. Penumalli, Earlean Polka, MD Taking Active   glucose blood (ACCU-CHEK GUIDE) test strip [id number] No Use as instructed to check sugars 1-2 times daily. [name], MD Taking Active   hydrocortisone 2.5 % cream [id number] No Apply topically 2 (two) times daily.  [name], MD Taking Active   loratadine (CLARITIN) 10 MG tablet [id number] No Take 10 mg by mouth daily. [provider] Taking Active Self  meclizine (ANTIVERT) 25 MG tablet [id number] No Take 1 tablet (25 mg total) by mouth 3 (three) times daily as needed for dizziness. Gardenia Phlegm, NP Taking Active Self           Med Note ([place], FABIOLA   Wed Aug 22, 2021 11:48 AM) PRN  oxymetazoline (AFRIN) 0.05 % nasal spray [id number] No Place 1 spray into both nostrils 2 (two) times daily as needed for congestion. Or bloody nose. [name], NP Taking Active   pantoprazole (PROTONIX) 40 MG tablet [id number] No TAKE 1 TABLET BY MOUTH TWICE  DAILY [name], MD Taking Active   tiZANidine (ZANAFLEX) 4 MG tablet [id number] No TAKE 1 TABLET BY MOUTH AT  BEDTIME [name], MD Taking Active   traZODone (DESYREL) 100 MG tablet [id number] No TAKE 1 TABLET BY MOUTH AT  BEDTIME [name], MD Taking Active             SDOH:  (Social Determinants of Health) assessments and interventions performed: No Financial Resource Strain: High Risk (12/21/2021)   Overall Financial Resource Strain (CARDIA)    Difficulty of Paying Living Expenses: Hard   Food Insecurity: No Food Insecurity (08/02/2022)   Hunger Vital Sign    Worried About Running Out of Food in the Last Year: Never true    Ran Out of Food in the Last Year: Never true    SDOH Interventions    Flowsheet Row Telephone from 08/02/2022 in [facility] Telephone from 05/14/2022 in [facility] Telephone from 12/21/2021 in [facility] from 12/20/2021 in [facility] at [facility] Visit from 05/04/2021 in [facility] at [facility] from 05/12/2020 in [facility] at  [facility] Interventions Intervention Not Indicated -- -- Intervention Not Indicated -- Other (Comment)  Housing Interventions Intervention Not Indicated Other (Comment)  [referred to [facility]] -- Intervention Not Indicated -- --  Transportation Interventions Intervention Not Indicated Intervention Not Indicated -- Intervention Not Indicated -- --  Utilities Interventions Intervention Not Indicated Intervention Not Indicated -- -- -- --  Depression Interventions/Treatment  -- -- -- -- Medication --  Financial Strain Interventions -- -- Other (Comment) [id number] Referral -- --  Physical Activity Interventions -- -- -- Other (Comments)  [Encouraged chair exercises.] -- --  Stress Interventions -- -- -- Intervention Not Indicated -- --  Social Connections Interventions -- -- -- Intervention Not Indicated -- --       Medication Assistance: None required.  Patient affirms current coverage meets needs.  Medication Access: Within the past 30 days, how often has patient missed a dose of medication? 0 Is a pillbox or other method used to improve adherence? Yes  Factors that may affect medication adherence? no barriers identified Are meds synced by current pharmacy? No  Are meds delivered by current pharmacy? Yes  Does patient experience delays in picking up medications due to transportation concerns? No   [facility] Services Reviewed: Is patient disadvantaged to use [facility] Pharm[facility]?: Yes  Current Rx insurance plan: [facility] Name and location of Current pharmacy:  [facility] [place], [place] - [place]  AT [facility] [address] [place] [address] Phone: [phone] Fax: [phone]  [facility] ([place], [facility] [address] [place] [address] [place] [address] Phone: [phone] Fax: [place], [place] [address] Phone: [phone] Fax: [phone]  [facility] Pharm[facility] services reviewed with patient today?: Yes  Patient requests to transfer care to [facility] Pharmacy?: No  Reason patient declined to change pharmacies: Disadvantaged due to insurance/mail order  Compliance/Adherence/Medication fill history: Care Gaps: Eye exam - pt to schedule  Star-Rating Drugs: N/A   Assessment/Plan     Hypertension (BP goal <130/80) 09/11/22 -Uncontrolled, continues to be high in the office as well -Current treatment: None at this time -Medications previously tried: none noted  -Current home readings: majority of the time 133/60-70s -Denies hypotensive/hypertensive symptoms -Educated on BP goals and benefits of medications for prevention of heart attack, stroke and kidney damage; Daily salt intake goal < 2300 mg; Exercise goal of 150 minutes per week; A few outliers at home but reports general average around Q000111Q systolic.  Not on any BP meds at this time.  UACR was normal - continue to monitor.  Can start if BP consistently high.  Hyperlipidemia/CAD: (LDL goal < 70) -Uncontrolled -Current treatment: Atorvastatin 15m Appropriate, Query effective,  -Medications previously tried: none noted  -Current dietary patterns: same see previous -Current exercise habits: minimal -Educated on Cholesterol goals;  Benefits of statin for ASCVD risk reduction; Importance of limiting foods high in cholesterol; LDL not at goal for CAD and DM. -Recommend recheck lipids, if elevated would consider increase to Atorvastatin 211m  Diabetes (A1c goal <7%) 09/11/22 -Controlled, most recent A1c is 6.5% -Current medications: None - diet controlled -Medications previously tried: none ntoed  -Current home glucose readings fasting glucose: not checking post prandial glucose: not checking -Denies hypoglycemic/hyperglycemic  symptoms -Educated on A1c and blood sugar goals; Complications of diabetes including kidney damage, retinal damage, and cardiovascular disease; Counseled to check feet daily and get yearly eye exams -Counseled to check feet daily and get yearly eye exams -She did say that she started back on her metformin on her own due to some higher glucose readings.  She will need a refill sent in on this if she is to continue. Will consult with PCP - pharmacy to send request.       ChBeverly MilchPharmD Clinical Pharmacist  [phone]

## 2022-09-13 ENCOUNTER — Other Ambulatory Visit (HOSPITAL_COMMUNITY): Payer: Self-pay

## 2022-09-13 ENCOUNTER — Other Ambulatory Visit: Payer: Self-pay

## 2022-09-13 ENCOUNTER — Ambulatory Visit: Payer: Medicare Other | Admitting: Hematology

## 2022-09-13 ENCOUNTER — Inpatient Hospital Stay: Payer: Medicare Other | Attending: Hematology

## 2022-09-13 ENCOUNTER — Other Ambulatory Visit: Payer: Medicare Other

## 2022-09-13 ENCOUNTER — Inpatient Hospital Stay: Payer: Medicare Other

## 2022-09-13 ENCOUNTER — Encounter: Payer: Self-pay | Admitting: Hematology

## 2022-09-13 VITALS — BP 155/60 | HR 72 | Temp 98.2°F | Resp 18

## 2022-09-13 DIAGNOSIS — Z7189 Other specified counseling: Secondary | ICD-10-CM

## 2022-09-13 DIAGNOSIS — N189 Chronic kidney disease, unspecified: Secondary | ICD-10-CM | POA: Diagnosis not present

## 2022-09-13 DIAGNOSIS — I252 Old myocardial infarction: Secondary | ICD-10-CM | POA: Insufficient documentation

## 2022-09-13 DIAGNOSIS — C9000 Multiple myeloma not having achieved remission: Secondary | ICD-10-CM

## 2022-09-13 DIAGNOSIS — Z95828 Presence of other vascular implants and grafts: Secondary | ICD-10-CM

## 2022-09-13 DIAGNOSIS — Z79899 Other long term (current) drug therapy: Secondary | ICD-10-CM | POA: Insufficient documentation

## 2022-09-13 DIAGNOSIS — Z803 Family history of malignant neoplasm of breast: Secondary | ICD-10-CM | POA: Diagnosis not present

## 2022-09-13 DIAGNOSIS — Z800 Family history of malignant neoplasm of digestive organs: Secondary | ICD-10-CM | POA: Insufficient documentation

## 2022-09-13 DIAGNOSIS — G2581 Restless legs syndrome: Secondary | ICD-10-CM | POA: Diagnosis not present

## 2022-09-13 DIAGNOSIS — Z923 Personal history of irradiation: Secondary | ICD-10-CM | POA: Insufficient documentation

## 2022-09-13 DIAGNOSIS — C7951 Secondary malignant neoplasm of bone: Secondary | ICD-10-CM

## 2022-09-13 DIAGNOSIS — E1140 Type 2 diabetes mellitus with diabetic neuropathy, unspecified: Secondary | ICD-10-CM | POA: Diagnosis not present

## 2022-09-13 DIAGNOSIS — Z853 Personal history of malignant neoplasm of breast: Secondary | ICD-10-CM | POA: Insufficient documentation

## 2022-09-13 DIAGNOSIS — Z87891 Personal history of nicotine dependence: Secondary | ICD-10-CM | POA: Insufficient documentation

## 2022-09-13 DIAGNOSIS — E1122 Type 2 diabetes mellitus with diabetic chronic kidney disease: Secondary | ICD-10-CM | POA: Diagnosis not present

## 2022-09-13 DIAGNOSIS — Z8673 Personal history of transient ischemic attack (TIA), and cerebral infarction without residual deficits: Secondary | ICD-10-CM | POA: Insufficient documentation

## 2022-09-13 DIAGNOSIS — D509 Iron deficiency anemia, unspecified: Secondary | ICD-10-CM | POA: Insufficient documentation

## 2022-09-13 DIAGNOSIS — E538 Deficiency of other specified B group vitamins: Secondary | ICD-10-CM | POA: Diagnosis not present

## 2022-09-13 DIAGNOSIS — Z5112 Encounter for antineoplastic immunotherapy: Secondary | ICD-10-CM | POA: Diagnosis not present

## 2022-09-13 LAB — CBC WITH DIFFERENTIAL (CANCER CENTER ONLY)
Abs Immature Granulocytes: 0.01 10*3/uL (ref 0.00–0.07)
Basophils Absolute: 0 10*3/uL (ref 0.0–0.1)
Basophils Relative: 1 %
Eosinophils Absolute: 0.1 10*3/uL (ref 0.0–0.5)
Eosinophils Relative: 1 %
HCT: 29.7 % — ABNORMAL LOW (ref 36.0–46.0)
Hemoglobin: 10.1 g/dL — ABNORMAL LOW (ref 12.0–15.0)
Immature Granulocytes: 0 %
Lymphocytes Relative: 36 %
Lymphs Abs: 1.8 10*3/uL (ref 0.7–4.0)
MCH: 31.6 pg (ref 26.0–34.0)
MCHC: 34 g/dL (ref 30.0–36.0)
MCV: 92.8 fL (ref 80.0–100.0)
Monocytes Absolute: 0.3 10*3/uL (ref 0.1–1.0)
Monocytes Relative: 6 %
Neutro Abs: 2.9 10*3/uL (ref 1.7–7.7)
Neutrophils Relative %: 56 %
Platelet Count: 288 10*3/uL (ref 150–400)
RBC: 3.2 MIL/uL — ABNORMAL LOW (ref 3.87–5.11)
RDW: 13.2 % (ref 11.5–15.5)
WBC Count: 5.2 10*3/uL (ref 4.0–10.5)
nRBC: 0 % (ref 0.0–0.2)

## 2022-09-13 LAB — CMP (CANCER CENTER ONLY)
ALT: 12 U/L (ref 0–44)
AST: 14 U/L — ABNORMAL LOW (ref 15–41)
Albumin: 4.1 g/dL (ref 3.5–5.0)
Alkaline Phosphatase: 71 U/L (ref 38–126)
Anion gap: 10 (ref 5–15)
BUN: 15 mg/dL (ref 8–23)
CO2: 24 mmol/L (ref 22–32)
Calcium: 8.8 mg/dL — ABNORMAL LOW (ref 8.9–10.3)
Chloride: 107 mmol/L (ref 98–111)
Creatinine: 1.25 mg/dL — ABNORMAL HIGH (ref 0.44–1.00)
GFR, Estimated: 44 mL/min — ABNORMAL LOW (ref >60)
Glucose, Bld: 150 mg/dL — ABNORMAL HIGH (ref 70–99)
Potassium: 3.9 mmol/L (ref 3.5–5.1)
Sodium: 141 mmol/L (ref 135–145)
Total Bilirubin: 0.3 mg/dL (ref 0.3–1.2)
Total Protein: 6.4 g/dL — ABNORMAL LOW (ref 6.5–8.1)

## 2022-09-13 MED ORDER — SODIUM CHLORIDE 0.9 % IV SOLN
16.0000 mg | Freq: Once | INTRAVENOUS | Status: AC
  Administered 2022-09-13: 16 mg via INTRAVENOUS
  Filled 2022-09-13: qty 1.6

## 2022-09-13 MED ORDER — DIPHENHYDRAMINE HCL 25 MG PO CAPS
50.0000 mg | ORAL_CAPSULE | Freq: Once | ORAL | Status: AC
  Administered 2022-09-13: 50 mg via ORAL
  Filled 2022-09-13: qty 2

## 2022-09-13 MED ORDER — ACETAMINOPHEN 325 MG PO TABS
650.0000 mg | ORAL_TABLET | Freq: Once | ORAL | Status: AC
  Administered 2022-09-13: 650 mg via ORAL
  Filled 2022-09-13: qty 2

## 2022-09-13 MED ORDER — SODIUM CHLORIDE 0.9 % IV SOLN: Freq: Once | INTRAVENOUS | Status: AC

## 2022-09-13 MED ORDER — SODIUM CHLORIDE 0.9% FLUSH: 10.0000 mL | INTRAVENOUS | Status: DC | PRN

## 2022-09-13 MED ORDER — SODIUM CHLORIDE 0.9 % IV SOLN
16.0000 mg/kg | Freq: Once | INTRAVENOUS | Status: AC
  Administered 2022-09-13: 1300 mg via INTRAVENOUS
  Filled 2022-09-13: qty 5

## 2022-09-13 MED ORDER — SODIUM CHLORIDE 0.9% FLUSH
10.0000 mL | Freq: Once | INTRAVENOUS | Status: AC
  Administered 2022-09-13: 10 mL

## 2022-09-13 MED ORDER — LIDOCAINE-PRILOCAINE 2.5-2.5 % EX CREA
1.0000 | TOPICAL_CREAM | CUTANEOUS | 0 refills | Status: DC | PRN
  Filled 2022-09-13: qty 30, 30d supply, fill #0
  Filled 2022-09-25: qty 30, 15d supply, fill #0

## 2022-09-13 MED ORDER — FAMOTIDINE IN NACL 20-0.9 MG/50ML-% IV SOLN
20.0000 mg | Freq: Once | INTRAVENOUS | Status: AC
  Administered 2022-09-13: 20 mg via INTRAVENOUS
  Filled 2022-09-13: qty 50

## 2022-09-13 MED ORDER — HEPARIN SOD (PORK) LOCK FLUSH 100 UNIT/ML IV SOLN: 500.0000 [IU] | Freq: Once | INTRAVENOUS | Status: DC | PRN

## 2022-09-13 NOTE — Patient Instructions (Signed)
[facility]  Discharge Instructions: Thank you for choosing [facility] to provide your oncology and hematology care.   If you have a lab appointment with the [facility], please go directly to the [facility] and check in at the registration area.   Wear comfortable clothing and clothing appropriate for easy access to any Portacath or PICC line.   We strive to give you quality time with your provider. You may need to reschedule your appointment if you arrive late (15 or more minutes).  Arriving late affects you and other patients whose appointments are after yours.  Also, if you miss three or more appointments without notifying the office, you may be dismissed from the clinic at the provider's discretion.      For prescription refill requests, have your pharmacy contact our office and allow 72 hours for refills to be completed.    Today you received the following chemotherapy and/or immunotherapy agents: daratumumab      To help prevent nausea and vomiting after your treatment, we encourage you to take your nausea medication as directed.  BELOW ARE SYMPTOMS THAT SHOULD BE REPORTED IMMEDIATELY: *FEVER GREATER THAN 100.4 F (38 C) OR HIGHER *CHILLS OR SWEATING *NAUSEA AND VOMITING THAT IS NOT CONTROLLED WITH YOUR NAUSEA MEDICATION *UNUSUAL SHORTNESS OF BREATH *UNUSUAL BRUISING OR BLEEDING *URINARY PROBLEMS (pain or burning when urinating, or frequent urination) *BOWEL PROBLEMS (unusual diarrhea, constipation, pain near the anus) TENDERNESS IN MOUTH AND THROAT WITH OR WITHOUT PRESENCE OF ULCERS (sore throat, sores in mouth, or a toothache) UNUSUAL RASH, SWELLING OR PAIN  UNUSUAL VAGINAL DISCHARGE OR ITCHING   Items with * indicate a potential emergency and should be followed up as soon as possible or go to the Emergency Department if any problems should occur.  Please show the CHEMOTHERAPY ALERT CARD or IMMUNOTHERAPY ALERT CARD at  check-in to the Emergency Department and triage nurse.  Should you have questions after your visit or need to cancel or reschedule your appointment, please contact [facility]  Dept: [phone]  and follow the prompts.  Office hours are 8:00 a.m. to 4:30 p.m. Monday - Friday. Please note that voicemails left after 4:00 p.m. may not be returned until the following business day.  We are closed weekends and major holidays. You have access to a nurse at all times for urgent questions. Please call the main number to the clinic Dept: [phone] and follow the prompts.   For any non-urgent questions, you may also contact your provider using MyChart. We now offer e-Visits for anyone 62 and older to request care online for non-urgent symptoms. For details visit [email or web address].   Also download the MyChart app! Go to the app store, search "MyChart", open the app, select [facility], and log in with your MyChart username and password.

## 2022-09-17 ENCOUNTER — Other Ambulatory Visit: Payer: Self-pay | Admitting: Hematology and Oncology

## 2022-09-17 DIAGNOSIS — Z1231 Encounter for screening mammogram for malignant neoplasm of breast: Secondary | ICD-10-CM

## 2022-09-19 ENCOUNTER — Other Ambulatory Visit: Payer: Self-pay

## 2022-09-19 DIAGNOSIS — E1140 Type 2 diabetes mellitus with diabetic neuropathy, unspecified: Secondary | ICD-10-CM

## 2022-09-19 LAB — MULTIPLE MYELOMA PANEL, SERUM
Albumin SerPl Elph-Mcnc: 3.7 g/dL (ref 2.9–4.4)
Albumin/Glob SerPl: 1.6 (ref 0.7–1.7)
Alpha 1: 0.2 g/dL (ref 0.0–0.4)
Alpha2 Glob SerPl Elph-Mcnc: 1.1 g/dL — ABNORMAL HIGH (ref 0.4–1.0)
B-Globulin SerPl Elph-Mcnc: 0.8 g/dL (ref 0.7–1.3)
Gamma Glob SerPl Elph-Mcnc: 0.3 g/dL — ABNORMAL LOW (ref 0.4–1.8)
Globulin, Total: 2.4 g/dL (ref 2.2–3.9)
IgA: 67 mg/dL (ref 64–422)
IgG (Immunoglobin G), Serum: 443 mg/dL — ABNORMAL LOW (ref 586–1602)
IgM (Immunoglobulin M), Srm: 47 mg/dL (ref 26–217)
Total Protein ELP: 6.1 g/dL (ref 6.0–8.5)

## 2022-09-19 MED ORDER — METFORMIN HCL 500 MG PO TABS: ORAL_TABLET | ORAL | 3 refills | Status: DC

## 2022-09-20 ENCOUNTER — Other Ambulatory Visit (HOSPITAL_COMMUNITY): Payer: Self-pay

## 2022-09-20 ENCOUNTER — Inpatient Hospital Stay: Payer: Medicare Other

## 2022-09-20 ENCOUNTER — Other Ambulatory Visit: Payer: Self-pay

## 2022-09-20 VITALS — BP 153/70 | HR 82 | Temp 98.6°F | Resp 18 | Wt 191.4 lb

## 2022-09-20 DIAGNOSIS — Z79899 Other long term (current) drug therapy: Secondary | ICD-10-CM | POA: Diagnosis not present

## 2022-09-20 DIAGNOSIS — N189 Chronic kidney disease, unspecified: Secondary | ICD-10-CM | POA: Diagnosis not present

## 2022-09-20 DIAGNOSIS — C9000 Multiple myeloma not having achieved remission: Secondary | ICD-10-CM

## 2022-09-20 DIAGNOSIS — Z923 Personal history of irradiation: Secondary | ICD-10-CM | POA: Diagnosis not present

## 2022-09-20 DIAGNOSIS — I252 Old myocardial infarction: Secondary | ICD-10-CM | POA: Diagnosis not present

## 2022-09-20 DIAGNOSIS — C7951 Secondary malignant neoplasm of bone: Secondary | ICD-10-CM

## 2022-09-20 DIAGNOSIS — D509 Iron deficiency anemia, unspecified: Secondary | ICD-10-CM | POA: Diagnosis not present

## 2022-09-20 DIAGNOSIS — E538 Deficiency of other specified B group vitamins: Secondary | ICD-10-CM | POA: Diagnosis not present

## 2022-09-20 DIAGNOSIS — Z800 Family history of malignant neoplasm of digestive organs: Secondary | ICD-10-CM | POA: Diagnosis not present

## 2022-09-20 DIAGNOSIS — Z853 Personal history of malignant neoplasm of breast: Secondary | ICD-10-CM | POA: Diagnosis not present

## 2022-09-20 DIAGNOSIS — Z95828 Presence of other vascular implants and grafts: Secondary | ICD-10-CM

## 2022-09-20 DIAGNOSIS — E1140 Type 2 diabetes mellitus with diabetic neuropathy, unspecified: Secondary | ICD-10-CM | POA: Diagnosis not present

## 2022-09-20 DIAGNOSIS — Z5112 Encounter for antineoplastic immunotherapy: Secondary | ICD-10-CM | POA: Diagnosis not present

## 2022-09-20 DIAGNOSIS — Z8673 Personal history of transient ischemic attack (TIA), and cerebral infarction without residual deficits: Secondary | ICD-10-CM | POA: Diagnosis not present

## 2022-09-20 DIAGNOSIS — Z87891 Personal history of nicotine dependence: Secondary | ICD-10-CM | POA: Diagnosis not present

## 2022-09-20 DIAGNOSIS — G2581 Restless legs syndrome: Secondary | ICD-10-CM | POA: Diagnosis not present

## 2022-09-20 DIAGNOSIS — Z803 Family history of malignant neoplasm of breast: Secondary | ICD-10-CM | POA: Diagnosis not present

## 2022-09-20 DIAGNOSIS — E1122 Type 2 diabetes mellitus with diabetic chronic kidney disease: Secondary | ICD-10-CM | POA: Diagnosis not present

## 2022-09-20 MED ORDER — HEPARIN SOD (PORK) LOCK FLUSH 100 UNIT/ML IV SOLN
500.0000 [IU] | Freq: Once | INTRAVENOUS | Status: AC
  Administered 2022-09-20: 500 [IU]

## 2022-09-20 MED ORDER — SODIUM CHLORIDE 0.9% FLUSH
10.0000 mL | Freq: Once | INTRAVENOUS | Status: AC
  Administered 2022-09-20: 10 mL

## 2022-09-20 MED ORDER — SODIUM CHLORIDE 0.9 % IV SOLN
60.0000 mg | Freq: Once | INTRAVENOUS | Status: AC
  Administered 2022-09-20: 60 mg via INTRAVENOUS
  Filled 2022-09-20: qty 20

## 2022-09-20 MED ORDER — SODIUM CHLORIDE 0.9 % IV SOLN: INTRAVENOUS | Status: DC

## 2022-09-20 MED ORDER — CYANOCOBALAMIN 1000 MCG/ML IJ SOLN
1000.0000 ug | Freq: Once | INTRAMUSCULAR | Status: AC
  Administered 2022-09-20: 1000 ug via INTRAMUSCULAR
  Filled 2022-09-20: qty 1

## 2022-09-20 NOTE — Progress Notes (Unsigned)
Per Dr. Irene Limbo, [place] to treat with calcium 8.8.

## 2022-09-23 ENCOUNTER — Ambulatory Visit
Admission: RE | Admit: 2022-09-23 | Discharge: 2022-09-23 | Disposition: A | Discharge status: Home / Self Care | Payer: Medicare Other | Source: Ambulatory Visit | Attending: Hematology and Oncology | Admitting: Hematology and Oncology

## 2022-09-23 ENCOUNTER — Inpatient Hospital Stay: Admission: RE | Admit: 2022-09-23 | Payer: Medicare Other | Source: Ambulatory Visit

## 2022-09-23 DIAGNOSIS — Z1231 Encounter for screening mammogram for malignant neoplasm of breast: Secondary | ICD-10-CM

## 2022-09-25 ENCOUNTER — Other Ambulatory Visit (HOSPITAL_COMMUNITY): Payer: Self-pay

## 2022-10-03 ENCOUNTER — Telehealth: Payer: Self-pay | Admitting: Hematology

## 2022-10-03 NOTE — Telephone Encounter (Signed)
Called patient per 3/7 IB message to schedule next A[facility]ia tx. Patient scheduled and notified.

## 2022-10-11 ENCOUNTER — Inpatient Hospital Stay: Payer: Medicare Other

## 2022-10-11 ENCOUNTER — Other Ambulatory Visit: Payer: Self-pay

## 2022-10-11 ENCOUNTER — Encounter: Payer: Self-pay | Admitting: Hematology

## 2022-10-11 ENCOUNTER — Inpatient Hospital Stay: Payer: Medicare Other | Attending: Hematology | Admitting: Hematology

## 2022-10-11 VITALS — BP 138/65 | HR 88 | Resp 16

## 2022-10-11 DIAGNOSIS — C7951 Secondary malignant neoplasm of bone: Secondary | ICD-10-CM | POA: Diagnosis not present

## 2022-10-11 DIAGNOSIS — Z7189 Other specified counseling: Secondary | ICD-10-CM | POA: Diagnosis not present

## 2022-10-11 DIAGNOSIS — Z5112 Encounter for antineoplastic immunotherapy: Secondary | ICD-10-CM | POA: Insufficient documentation

## 2022-10-11 DIAGNOSIS — C9000 Multiple myeloma not having achieved remission: Secondary | ICD-10-CM | POA: Insufficient documentation

## 2022-10-11 DIAGNOSIS — Z8673 Personal history of transient ischemic attack (TIA), and cerebral infarction without residual deficits: Secondary | ICD-10-CM | POA: Insufficient documentation

## 2022-10-11 DIAGNOSIS — G2581 Restless legs syndrome: Secondary | ICD-10-CM | POA: Insufficient documentation

## 2022-10-11 DIAGNOSIS — D509 Iron deficiency anemia, unspecified: Secondary | ICD-10-CM | POA: Diagnosis not present

## 2022-10-11 DIAGNOSIS — Z79899 Other long term (current) drug therapy: Secondary | ICD-10-CM | POA: Insufficient documentation

## 2022-10-11 DIAGNOSIS — Z95828 Presence of other vascular implants and grafts: Secondary | ICD-10-CM

## 2022-10-11 DIAGNOSIS — Z87891 Personal history of nicotine dependence: Secondary | ICD-10-CM | POA: Insufficient documentation

## 2022-10-11 DIAGNOSIS — Z923 Personal history of irradiation: Secondary | ICD-10-CM | POA: Insufficient documentation

## 2022-10-11 DIAGNOSIS — E1140 Type 2 diabetes mellitus with diabetic neuropathy, unspecified: Secondary | ICD-10-CM | POA: Insufficient documentation

## 2022-10-11 DIAGNOSIS — Z800 Family history of malignant neoplasm of digestive organs: Secondary | ICD-10-CM | POA: Insufficient documentation

## 2022-10-11 DIAGNOSIS — E1122 Type 2 diabetes mellitus with diabetic chronic kidney disease: Secondary | ICD-10-CM | POA: Diagnosis not present

## 2022-10-11 DIAGNOSIS — Z803 Family history of malignant neoplasm of breast: Secondary | ICD-10-CM | POA: Diagnosis not present

## 2022-10-11 DIAGNOSIS — N189 Chronic kidney disease, unspecified: Secondary | ICD-10-CM | POA: Insufficient documentation

## 2022-10-11 DIAGNOSIS — I252 Old myocardial infarction: Secondary | ICD-10-CM | POA: Insufficient documentation

## 2022-10-11 DIAGNOSIS — E538 Deficiency of other specified B group vitamins: Secondary | ICD-10-CM | POA: Insufficient documentation

## 2022-10-11 DIAGNOSIS — Z853 Personal history of malignant neoplasm of breast: Secondary | ICD-10-CM | POA: Diagnosis not present

## 2022-10-11 LAB — CBC WITH DIFFERENTIAL (CANCER CENTER ONLY)
Abs Immature Granulocytes: 0 10*3/uL (ref 0.00–0.07)
Basophils Absolute: 0.1 10*3/uL (ref 0.0–0.1)
Basophils Relative: 1 %
Eosinophils Absolute: 0.1 10*3/uL (ref 0.0–0.5)
Eosinophils Relative: 1 %
HCT: 30.9 % — ABNORMAL LOW (ref 36.0–46.0)
Hemoglobin: 10.6 g/dL — ABNORMAL LOW (ref 12.0–15.0)
Immature Granulocytes: 0 %
Lymphocytes Relative: 38 %
Lymphs Abs: 1.9 10*3/uL (ref 0.7–4.0)
MCH: 31.9 pg (ref 26.0–34.0)
MCHC: 34.3 g/dL (ref 30.0–36.0)
MCV: 93.1 fL (ref 80.0–100.0)
Monocytes Absolute: 0.3 10*3/uL (ref 0.1–1.0)
Monocytes Relative: 7 %
Neutro Abs: 2.7 10*3/uL (ref 1.7–7.7)
Neutrophils Relative %: 53 %
Platelet Count: 256 10*3/uL (ref 150–400)
RBC: 3.32 MIL/uL — ABNORMAL LOW (ref 3.87–5.11)
RDW: 13.1 % (ref 11.5–15.5)
WBC Count: 5 10*3/uL (ref 4.0–10.5)
nRBC: 0 % (ref 0.0–0.2)

## 2022-10-11 LAB — CMP (CANCER CENTER ONLY)
ALT: 11 U/L (ref 0–44)
AST: 14 U/L — ABNORMAL LOW (ref 15–41)
Albumin: 4.3 g/dL (ref 3.5–5.0)
Alkaline Phosphatase: 71 U/L (ref 38–126)
Anion gap: 10 (ref 5–15)
BUN: 14 mg/dL (ref 8–23)
CO2: 25 mmol/L (ref 22–32)
Calcium: 9.3 mg/dL (ref 8.9–10.3)
Chloride: 107 mmol/L (ref 98–111)
Creatinine: 1.28 mg/dL — ABNORMAL HIGH (ref 0.44–1.00)
GFR, Estimated: 43 mL/min — ABNORMAL LOW (ref >60)
Glucose, Bld: 133 mg/dL — ABNORMAL HIGH (ref 70–99)
Potassium: 4.1 mmol/L (ref 3.5–5.1)
Sodium: 142 mmol/L (ref 135–145)
Total Bilirubin: 0.3 mg/dL (ref 0.3–1.2)
Total Protein: 6.7 g/dL (ref 6.5–8.1)

## 2022-10-11 MED ORDER — FAMOTIDINE IN NACL 20-0.9 MG/50ML-% IV SOLN
20.0000 mg | Freq: Once | INTRAVENOUS | Status: AC
  Administered 2022-10-11: 20 mg via INTRAVENOUS
  Filled 2022-10-11: qty 50

## 2022-10-11 MED ORDER — DIPHENHYDRAMINE HCL 25 MG PO CAPS
50.0000 mg | ORAL_CAPSULE | Freq: Once | ORAL | Status: AC
  Administered 2022-10-11: 50 mg via ORAL
  Filled 2022-10-11: qty 2

## 2022-10-11 MED ORDER — HEPARIN SOD (PORK) LOCK FLUSH 100 UNIT/ML IV SOLN
500.0000 [IU] | Freq: Once | INTRAVENOUS | Status: AC | PRN
  Administered 2022-10-11: 500 [IU]

## 2022-10-11 MED ORDER — SODIUM CHLORIDE 0.9% FLUSH
10.0000 mL | Freq: Once | INTRAVENOUS | Status: AC
  Administered 2022-10-11: 10 mL

## 2022-10-11 MED ORDER — SODIUM CHLORIDE 0.9% FLUSH
10.0000 mL | INTRAVENOUS | Status: DC | PRN
  Administered 2022-10-11: 10 mL

## 2022-10-11 MED ORDER — SODIUM CHLORIDE 0.9 % IV SOLN
16.0000 mg/kg | Freq: Once | INTRAVENOUS | Status: AC
  Administered 2022-10-11: 1300 mg via INTRAVENOUS
  Filled 2022-10-11: qty 5

## 2022-10-11 MED ORDER — CYANOCOBALAMIN 1000 MCG/ML IJ SOLN
1000.0000 ug | Freq: Once | INTRAMUSCULAR | Status: AC
  Administered 2022-10-11: 1000 ug via INTRAMUSCULAR
  Filled 2022-10-11: qty 1

## 2022-10-11 MED ORDER — SODIUM CHLORIDE 0.9 % IV SOLN
16.0000 mg | Freq: Once | INTRAVENOUS | Status: AC
  Administered 2022-10-11: 16 mg via INTRAVENOUS
  Filled 2022-10-11: qty 1.6

## 2022-10-11 MED ORDER — ACETAMINOPHEN 325 MG PO TABS
650.0000 mg | ORAL_TABLET | Freq: Once | ORAL | Status: AC
  Administered 2022-10-11: 650 mg via ORAL
  Filled 2022-10-11: qty 2

## 2022-10-11 MED ORDER — SODIUM CHLORIDE 0.9 % IV SOLN: Freq: Once | INTRAVENOUS | Status: AC

## 2022-10-11 NOTE — Patient Instructions (Signed)
[facility]  Discharge Instructions: Thank you for choosing [facility] to provide your oncology and hematology care.   If you have a lab appointment with the Cancer Center, please go directly to the Cancer Center and check in at the registration area.   Wear comfortable clothing and clothing appropriate for easy access to any Portacath or PICC line.   We strive to give you quality time with your provider. You may need to reschedule your appointment if you arrive late (15 or more minutes).  Arriving late affects you and other patients whose appointments are after yours.  Also, if you miss three or more appointments without notifying the office, you may be dismissed from the clinic at the provider's discretion.      For prescription refill requests, have your pharmacy contact our office and allow 72 hours for refills to be completed.    Today you received the following chemotherapy and/or immunotherapy agents darzalex      To help prevent nausea and vomiting after your treatment, we encourage you to take your nausea medication as directed.  BELOW ARE SYMPTOMS THAT SHOULD BE REPORTED IMMEDIATELY: *FEVER GREATER THAN 100.4 F (38 C) OR HIGHER *CHILLS OR SWEATING *NAUSEA AND VOMITING THAT IS NOT CONTROLLED WITH YOUR NAUSEA MEDICATION *UNUSUAL SHORTNESS OF BREATH *UNUSUAL BRUISING OR BLEEDING *URINARY PROBLEMS (pain or burning when urinating, or frequent urination) *BOWEL PROBLEMS (unusual diarrhea, constipation, pain near the anus) TENDERNESS IN MOUTH AND THROAT WITH OR WITHOUT PRESENCE OF ULCERS (sore throat, sores in mouth, or a toothache) UNUSUAL RASH, SWELLING OR PAIN  UNUSUAL VAGINAL DISCHARGE OR ITCHING   Items with * indicate a potential emergency and should be followed up as soon as possible or go to the Emergency Department if any problems should occur.  Please show the CHEMOTHERAPY ALERT CARD or IMMUNOTHERAPY ALERT CARD at  check-in to the Emergency Department and triage nurse.  Should you have questions after your visit or need to cancel or reschedule your appointment, please contact [facility]  Dept: [phone]  and follow the prompts.  Office hours are 8:00 a.m. to 4:30 p.m. Monday - Friday. Please note that voicemails left after 4:00 p.m. may not be returned until the following business day.  We are closed weekends and major holidays. You have access to a nurse at all times for urgent questions. Please call the main number to the clinic Dept: [phone] and follow the prompts.   For any non-urgent questions, you may also contact your provider using MyChart. We now offer e-Visits for anyone 18 and older to request care online for non-urgent symptoms. For details visit [email or web address].   Also download the MyChart app! Go to the app store, search "MyChart", open the app, select [facility], and log in with your MyChart username and password.   

## 2022-10-11 NOTE — Progress Notes (Signed)
Labs reviewed by Dr Irene Limbo.  Orders placed for CMP and Myeloma panel.  Ok to treat if CMP parameters met.

## 2022-10-11 NOTE — Progress Notes (Signed)
[facility] Follow up:   Date of Service: 10/11/22   [name], MD [address] [place] [address]  CC: Follow-up for continued evaluation and management of multiple myeloma  SUMMARY OF ONCOLOGIC HISTORY: Oncology History  History of right breast cancer  04/16/2000 Surgery   Left breast: Triple negative  invasive ductal cancer treated with lumpectomy, adjuvant chemotherapy, radiation , in [place], unknown stage   06/07/2015 Mammogram   Right breast mass 6x 6 x 5 mm, right axillary lymph node with slight cortex thickening measured 5 mm    06/13/2015 Initial Diagnosis   Right breast needle biopsy: Invasive ductal carcinoma, grade 1, right axillary lymph node biopsy negative , ER 95%, PR 5%, Ki-67 10%, HER-2 negative   06/13/2015 Clinical Stage   Stage IA: T1b N0   07/07/2015 Surgery   Right lumpectomy: Invasive ductal carcinoma grade 1, 1 cm span, with low-grade DCIS, DCIS focally 0.1 cm to inferior margin, 0/3 lymph nodes negative, ER 95%, PR 5%, HER-2 negative ratio 1.1, Ki-67 10%   07/07/2015 Pathologic Stage   Stage IA: T1c N0   07/13/2015 Procedure   Breast High/Moderate Risk Panel reveals no clinically significant variant at ATM, BRCA1, BRCA2, CDH1, CHEK2, PALB2, PTEN, and TP53.     08/23/2015 - 09/21/2015 Radiation Therapy   Adjuvant Radiation: Right breast/ 42.5Gy at 2.5 Gy per fraction x 17 fractions.   Right breast boost/ 7.5 Gy at 2.5 Gy per fraction x 3 fractions    Anti-estrogen oral therapy   Patient refused antiestrogen therapy   10/20/2015 Survivorship   Survivorship care plan completed and mailed to patient in lieu of in person visit at her request   Iron deficiency anemia  Multiple myeloma ([place])  01/25/2020 Initial Diagnosis   Multiple myeloma ([place])   02/11/2020 - 05/19/2020 Adjuvant Chemotherapy   Daratumumab Weekly    02/29/2020 - 03/13/2020 Radiation Therapy   The targets were treated to a total dose of 20 Gy in 10  fractions of 2 Gy each to the left hip and L5 using one plan.   06/02/2020 - 10/05/2020 Adjuvant Chemotherapy   Daratumumab and Carfilzomib   10/19/2020 -  Adjuvant Chemotherapy   Maintenance Daratumumab--initially every 2 weeks, then every 4 weeks.    05/24/2022 -  Chemotherapy   Patient is on Treatment Plan : MYELOMA Daratumumab IV q28d     Malignant neoplasm metastatic to bone ([place])  02/08/2021 Initial Diagnosis   Bone metastases ([place])   05/24/2022 -  Chemotherapy   Patient is on Treatment Plan : MYELOMA Daratumumab IV q28d       CURRENT THERAPY: Daratumumab/B12 injection   INTERVAL HISTORY:  Tracy [name] i is here for continued evaluation and management of multiple myeloma. She was last seen by me on 08/16/2022 and complained of restless leg, neuropathy, swimmy headedness, nausea, and short-lived satiability.  Today, she reports that over the last month she has been feeling significantly better overall. She has been tolerating the Daratumumab She does regularly consume vitamin D in her diet. She is planning on travelling to [place] in the next couple of weeks.  She does complain of some soreness in her right upper extremity. She also notes some discomfort and heaviness in her bilateral lower extremities.  Patient Active Problem List   Diagnosis Date Noted   RLS (restless legs syndrome) 06/12/2022   Malignant neoplasm metastatic to bone ([place]) 02/08/2021   Angiodysplasia of intestine 08/23/2020   Port-A-Cath  in place 08/04/2020   Counseling regarding advance care planning and goals of care 02/07/2020   Multiple myeloma ([place]) 01/25/2020   Dizziness 10/12/2019   Post-nasal drainage 10/12/2019   Allergic rhinitis 08/19/2018   Type 2 diabetes mellitus with diabetic neuropathy, unspecified ([place]) 11/05/2017   Encounter for long-term use of muscle relaxants 09/24/2017   CAD in native artery 09/24/2017   OSA (obstructive sleep apnea) 09/24/2017   [place] 09/24/2017   Asthenia  06/30/2017   Morbid obesity ([place]) 06/02/2017   Depression 06/02/2017   Iron deficiency anemia 09/23/2016   Anemia of chronic disease 09/28/2015   Genetic testing 08/21/2015   History of left breast cancer 07/13/2015   History of right breast cancer 06/20/2015   Hearing loss due to cerumen impaction 12/29/2014   Allergy to adhesive tape 05/19/2014   Hyperlipidemia 12/06/2013   GERD (gastroesophageal reflux disease) 12/06/2013   Cervical disc disease 11/11/2013   Osteopenia 05/25/2013   Allergic asthma 12/24/2012   Insomnia 04/02/2012   Physical exam, annual 04/02/2012   HTN (hypertension) 02/03/2012   Vertigo, benign positional 02/03/2012   Left groin pain 02/03/2012   Hip pain 10/10/2011   TIA (transient ischemic attack) 07/24/2011   Allergic reaction 07/05/2011   Osteoarthrosis involving lower leg 10/19/2010   Disorder of bone and cartilage 05/01/2010   Generalized anxiety disorder 05/01/2010   Vitamin D deficiency 02/20/2010   Unspecified chronic bronchitis ([place]) 08/24/2009   Other lymphedema 10/29/2007    is allergic to bacitracin-neomycin-polymyxin  [neomycin-bacitracin zn-polymyx], nsaids, tape, ambien [zolpidem tartrate], amoxicillin, clavulanic acid, contrast media [iodinated contrast media], latex, prednisone, and tessalon [benzonatate].  MEDICAL HISTORY: Past Medical History:  Diagnosis Date   Angiodysplasia of intestine 08/23/2020   Anxiety    Arthritis    Breast cancer ([place]) 06/13/15   Cancer ([place]) 2000   breast cancer   Chronic bronchitis ([place])    Chronic bronchitis ([place])    Hyperlipidemia    Hypertension    Myocardial infarction [facility]) 2001   Personal history of radiation therapy    Restless leg    Stroke ([place]) 2004   TIA, no deficits    SURGICAL HISTORY: Past Surgical History:  Procedure Laterality Date   ABDOMINAL HYSTERECTOMY  1985   BREAST LUMPECTOMY Left 2000   radiation and chemo   BREAST LUMPECTOMY Right 2016   radiation   BREAST SURGERY   2001   lt breast lumpectomy   COLONOSCOPY WITH ESOPHAGOGASTRODUODENOSCOPY (EGD)  04/2020   ENTEROSCOPY N/A 03/15/2021   Procedure: ENTEROSCOPY;  Surgeon: Milus Banister, MD;  Location: [facility];  Service: Endoscopy;  Laterality: N/A;   GIVENS CAPSULE STUDY  07/2020   HOT HEMOSTASIS N/A 03/15/2021   Procedure: HOT HEMOSTASIS (ARGON PLASMA COAGULATION/BICAP);  Surgeon: Milus Banister, MD;  Location: [facility];  Service: Endoscopy;  Laterality: N/A;   IR IMAGING GUIDED PORT INSERTION  04/25/2020   RADIOACTIVE SEED GUIDED PARTIAL MASTECTOMY WITH AXILLARY SENTINEL LYMPH NODE BIOPSY Right 07/07/2015   Procedure: RIGHT RADIOACTIVE SEED GUIDED PARTIAL MASTECTOMY WITH AXILLARY SENTINEL LYMPH NODE BIOPSY;  Surgeon: Autumn Messing III, MD;  Location: [facility];  Service: General;  Laterality: Right;   SMALL INTESTINE SURGERY     TUBAL LIGATION     UPPER GASTROINTESTINAL ENDOSCOPY      SOCIAL HISTORY: Social History   Socioeconomic History   Marital status: Divorced    Spouse name: Not on file   Number of children: 7   Years of education: Not on file  Highest education level: Not on file  Occupational History   Occupation: retired  Tobacco Use   Smoking status: Former    Packs/day: 1.00    Years: 20.00    Total pack years: 20.00    Types: Cigarettes    Quit date: 07/30/2011    Years since quitting: 11.0   Smokeless tobacco: Never   Tobacco comments:    Quit >4 years ago; 1 ppd for about 5/20 years (remaining was less)  Vaping Use   Vaping Use: Former  Substance and Sexual Activity   Alcohol use: No    Alcohol/week: 0.0 standard drinks of alcohol   Drug use: No   Sexual activity: Not Currently  Other Topics Concern   Not on file  Social History Narrative   Lives alone.  Retired.  Education:  11th grade GED.  Children:  7 (one here).    Social Determinants of Health   Financial Resource Strain: High Risk (12/21/2021)   Overall Financial Resource Strain  (CARDIA)    Difficulty of Paying Living Expenses: Hard  Food Insecurity: No Food Insecurity (08/02/2022)   Hunger Vital Sign    Worried About Running Out of Food in the Last Year: Never true    Ran Out of Food in the Last Year: Never true  Transportation Needs: No Transportation Needs (08/02/2022)   PRAPARE - Hydrologist (Medical): No    Lack of Transportation (Non-Medical): No  Physical Activity: Inactive (12/20/2021)   Exercise Vital Sign    Days of Exercise per Week: 0 days    Minutes of Exercise per Session: 0 min  Stress: No Stress Concern Present (12/20/2021)   [place]    Feeling of Stress : Not at all  Social Connections: Moderately Integrated (12/20/2021)   Social Connection and Isolation Panel [NHANES]    Frequency of Communication with Friends and Family: More than three times a week    Frequency of Social Gatherings with Friends and Family: More than three times a week    Attends Religious Services: More than 4 times per year    Active Member of [employer] or Organizations: Yes    Attends Music therapist: More than 4 times per year    Marital Status: Divorced  Intimate Partner Violence: Not At Risk (12/20/2021)   Humiliation, Afraid, Rape, and Kick questionnaire    Fear of Current or Ex-Partner: No    Emotionally Abused: No    Physically Abused: No    Sexually Abused: No    FAMILY HISTORY: Family History  Problem Relation Age of Onset   Emphysema Mother 77       smoker   Diabetes Father    Lung cancer Sister        dx. <50; former smoker   Diabetes Brother    Diabetes Brother    Brain cancer Brother 67       unknown tumor type   Diabetes Paternal Aunt    Stroke Maternal Grandmother    Diabetes Paternal Grandmother    Cancer Daughter 61       neck cancer   Other Daughter        hysterectomy for unspecified reason   Colon cancer Daughter    Breast cancer  Cousin    Cancer Cousin        unspecified type   Breast cancer Other        triple negative breast cancer in  her 57s   Colon polyps Neg Hx    Esophageal cancer Neg Hx    Gallbladder disease Neg Hx    ROS  10 Point review of Systems was done is negative except as noted above.   PHYSICAL EXAMINATION  ECOG PERFORMANCE STATUS: 1 - Symptomatic but completely ambulatory  Vitals:   10/11/22 1132  BP: 138/71  Pulse: 85  Resp: 18  Temp: (!) 97.3 F (36.3 C)  SpO2: 97%   GENERAL:alert, in no acute distress and comfortable SKIN: no acute rashes, no significant lesions EYES: conjunctiva are pink and non-injected, sclera anicteric OROPHARYNX: MMM, no exudates, no oropharyngeal erythema or ulceration NECK: supple, no JVD LYMPH:  no palpable lymphadenopathy in the cervical, axillary or inguinal regions LUNGS: clear to auscultation b/l with normal respiratory effort HEART: regular rate & rhythm ABDOMEN:  normoactive bowel sounds , non tender, not distended. Extremity: no pedal edema PSYCH: alert & oriented x 3 with fluent speech NEURO: no focal motor/sensory deficits    LABORATORY DATA: .    Latest Ref Rng & Units 10/11/2022   10:57 AM 09/13/2022   11:35 AM 08/16/2022   10:07 AM  CBC  WBC 4.0 - 10.5 K/uL 5.0  5.2  5.0   Hemoglobin 12.0 - 15.0 g/dL 10.6  10.1  10.3   Hematocrit 36.0 - 46.0 % 30.9  29.7  29.7   Platelets 150 - 400 K/uL 256  288  282    .    Latest Ref Rng & Units 10/11/2022   10:57 AM 09/13/2022   11:35 AM 08/16/2022   10:07 AM  CMP  Glucose 70 - 99 mg/dL 133  150  119   BUN 8 - 23 mg/dL 14  15  16    Creatinine 0.44 - 1.00 mg/dL 1.28  1.25  1.25   Sodium 135 - 145 mmol/L 142  141  140   Potassium 3.5 - 5.1 mmol/L 4.1  3.9  4.1   Chloride 98 - 111 mmol/L 107  107  104   CO2 22 - 32 mmol/L 25  24  26    Calcium 8.9 - 10.3 mg/dL 9.3  8.8  9.4   Total Protein 6.5 - 8.1 g/dL 6.7  6.4  6.2   Total Bilirubin 0.3 - 1.2 mg/dL 0.3  0.3  0.2   Alkaline Phos 38 -  126 U/L 71  71  71   AST 15 - 41 U/L 14  14  13    ALT 0 - 44 U/L 11  12  10      Mammogram 09/23/2022:   ASSESSMENT and THERAPY PLAN:   77 yo here for follow-up of her for follow-up of her multiple myeloma   1)  IgG Kappa Multiple myeloma with bone lesions, anemia, renal insuff. M spike @ 3.7g/dl on diagnosis. 1p deletion, polymorphic variant, 13q deletion Multiple myeloma panel from 06/28/2021 with no M spike.  IFE positive for IgG kappa monoclonal protein possibly from her daratumumab. Currently on monthly daratumumab 2) h/o DM2 3) Diabetic Neuropathy 4) CKD - likely from DM2, but could have an element of myeloma kidney. 5) h/o TIA and AMI 6) Iron deficiency 7) B12 deficiency   PLAN:  -Discussed lab results on 10/11/2022 with patient. CBC showed WBC of 5.0K, hemoglobin of 10.6, and platelets of 256K. -CBC stable -CMP normal -Continue monthly Daratumumab treatment for continued maintenance -Last myeloma panel revealed that pt continues to be in remission -No anemia -answered all of patient's questions in detail -stop  daily vitamin D to reduce burden of tablet intake -Continue taking weekly vitamin D supplements to strengthen immune system and improve bone health -Mammogram 09/23/2022 did not reveal any lymph nodes -no notably toxicities from current treatment. -she will continue monthly daratumumab, orders reviewed  -Pamidronate every 8 weeks -continue monthly B12   Follow-up: Integrated   The total time spent in the appointment was 30 minutes* .  All of the patient's questions were answered with apparent satisfaction. The patient knows to call the clinic with any problems, questions or concerns.   [name] MD MS AAHIVMS [facility] Hematology/Oncology Physician [facility]  .*Total Encounter Time as defined by the Centers for Medicare and Medicaid Services includes, in addition to the face-to-face time of a patient visit (documented in the note above)  non-face-to-face time: obtaining and reviewing outside history, ordering and reviewing medications, tests or procedures, care coordination (communications with other health care professionals or caregivers) and documentation in the medical record.   I,Mitra Faeizi,acting as a Education administrator for [name], MD.,have documented all relevant documentation on the behalf of [name], MD,as directed by  [name], MD while in the presence of [name], MD. .[name] MD

## 2022-10-16 LAB — MULTIPLE MYELOMA PANEL, SERUM
Albumin SerPl Elph-Mcnc: 3.6 g/dL (ref 2.9–4.4)
Albumin/Glob SerPl: 1.4 (ref 0.7–1.7)
Alpha 1: 0.2 g/dL (ref 0.0–0.4)
Alpha2 Glob SerPl Elph-Mcnc: 1.2 g/dL — ABNORMAL HIGH (ref 0.4–1.0)
B-Globulin SerPl Elph-Mcnc: 0.9 g/dL (ref 0.7–1.3)
Gamma Glob SerPl Elph-Mcnc: 0.4 g/dL (ref 0.4–1.8)
Globulin, Total: 2.6 g/dL (ref 2.2–3.9)
IgA: 67 mg/dL (ref 64–422)
IgG (Immunoglobin G), Serum: 446 mg/dL — ABNORMAL LOW (ref 586–1602)
IgM (Immunoglobulin M), Srm: 44 mg/dL (ref 26–217)
Total Protein ELP: 6.2 g/dL (ref 6.0–8.5)

## 2022-10-18 ENCOUNTER — Encounter: Payer: Self-pay | Admitting: Hematology

## 2022-10-27 ENCOUNTER — Other Ambulatory Visit: Payer: Self-pay | Admitting: Family Medicine

## 2022-11-06 ENCOUNTER — Other Ambulatory Visit: Payer: Self-pay | Admitting: Hematology

## 2022-11-08 ENCOUNTER — Other Ambulatory Visit: Payer: Self-pay

## 2022-11-08 ENCOUNTER — Inpatient Hospital Stay: Payer: Medicare Other | Attending: Hematology | Admitting: Physician Assistant

## 2022-11-08 ENCOUNTER — Inpatient Hospital Stay: Payer: Medicare Other

## 2022-11-08 VITALS — BP 143/67 | HR 79 | Resp 17

## 2022-11-08 VITALS — BP 138/64 | HR 78 | Temp 97.6°F | Resp 17 | Ht 60.0 in | Wt 190.9 lb

## 2022-11-08 DIAGNOSIS — E1122 Type 2 diabetes mellitus with diabetic chronic kidney disease: Secondary | ICD-10-CM | POA: Insufficient documentation

## 2022-11-08 DIAGNOSIS — Z87891 Personal history of nicotine dependence: Secondary | ICD-10-CM | POA: Insufficient documentation

## 2022-11-08 DIAGNOSIS — Z803 Family history of malignant neoplasm of breast: Secondary | ICD-10-CM | POA: Insufficient documentation

## 2022-11-08 DIAGNOSIS — Z79899 Other long term (current) drug therapy: Secondary | ICD-10-CM | POA: Diagnosis not present

## 2022-11-08 DIAGNOSIS — Z8673 Personal history of transient ischemic attack (TIA), and cerebral infarction without residual deficits: Secondary | ICD-10-CM | POA: Insufficient documentation

## 2022-11-08 DIAGNOSIS — C7951 Secondary malignant neoplasm of bone: Secondary | ICD-10-CM

## 2022-11-08 DIAGNOSIS — Z923 Personal history of irradiation: Secondary | ICD-10-CM | POA: Diagnosis not present

## 2022-11-08 DIAGNOSIS — Z7189 Other specified counseling: Secondary | ICD-10-CM

## 2022-11-08 DIAGNOSIS — N189 Chronic kidney disease, unspecified: Secondary | ICD-10-CM | POA: Insufficient documentation

## 2022-11-08 DIAGNOSIS — I252 Old myocardial infarction: Secondary | ICD-10-CM | POA: Diagnosis not present

## 2022-11-08 DIAGNOSIS — Z853 Personal history of malignant neoplasm of breast: Secondary | ICD-10-CM | POA: Insufficient documentation

## 2022-11-08 DIAGNOSIS — E1140 Type 2 diabetes mellitus with diabetic neuropathy, unspecified: Secondary | ICD-10-CM | POA: Insufficient documentation

## 2022-11-08 DIAGNOSIS — I129 Hypertensive chronic kidney disease with stage 1 through stage 4 chronic kidney disease, or unspecified chronic kidney disease: Secondary | ICD-10-CM | POA: Diagnosis not present

## 2022-11-08 DIAGNOSIS — Z90710 Acquired absence of both cervix and uterus: Secondary | ICD-10-CM | POA: Insufficient documentation

## 2022-11-08 DIAGNOSIS — Z5112 Encounter for antineoplastic immunotherapy: Secondary | ICD-10-CM | POA: Diagnosis not present

## 2022-11-08 DIAGNOSIS — Z800 Family history of malignant neoplasm of digestive organs: Secondary | ICD-10-CM | POA: Insufficient documentation

## 2022-11-08 DIAGNOSIS — Z95828 Presence of other vascular implants and grafts: Secondary | ICD-10-CM

## 2022-11-08 DIAGNOSIS — E538 Deficiency of other specified B group vitamins: Secondary | ICD-10-CM | POA: Insufficient documentation

## 2022-11-08 DIAGNOSIS — M545 Low back pain, unspecified: Secondary | ICD-10-CM | POA: Insufficient documentation

## 2022-11-08 DIAGNOSIS — C9000 Multiple myeloma not having achieved remission: Secondary | ICD-10-CM | POA: Diagnosis not present

## 2022-11-08 DIAGNOSIS — D509 Iron deficiency anemia, unspecified: Secondary | ICD-10-CM | POA: Diagnosis not present

## 2022-11-08 DIAGNOSIS — G2581 Restless legs syndrome: Secondary | ICD-10-CM | POA: Insufficient documentation

## 2022-11-08 DIAGNOSIS — G4733 Obstructive sleep apnea (adult) (pediatric): Secondary | ICD-10-CM | POA: Insufficient documentation

## 2022-11-08 LAB — CBC WITH DIFFERENTIAL (CANCER CENTER ONLY)
Abs Immature Granulocytes: 0.01 10*3/uL (ref 0.00–0.07)
Basophils Absolute: 0 10*3/uL (ref 0.0–0.1)
Basophils Relative: 1 %
Eosinophils Absolute: 0.1 10*3/uL (ref 0.0–0.5)
Eosinophils Relative: 1 %
HCT: 29 % — ABNORMAL LOW (ref 36.0–46.0)
Hemoglobin: 10 g/dL — ABNORMAL LOW (ref 12.0–15.0)
Immature Granulocytes: 0 %
Lymphocytes Relative: 44 %
Lymphs Abs: 2 10*3/uL (ref 0.7–4.0)
MCH: 32.2 pg (ref 26.0–34.0)
MCHC: 34.5 g/dL (ref 30.0–36.0)
MCV: 93.2 fL (ref 80.0–100.0)
Monocytes Absolute: 0.3 10*3/uL (ref 0.1–1.0)
Monocytes Relative: 7 %
Neutro Abs: 2.2 10*3/uL (ref 1.7–7.7)
Neutrophils Relative %: 47 %
Platelet Count: 273 10*3/uL (ref 150–400)
RBC: 3.11 MIL/uL — ABNORMAL LOW (ref 3.87–5.11)
RDW: 13.2 % (ref 11.5–15.5)
WBC Count: 4.6 10*3/uL (ref 4.0–10.5)
nRBC: 0 % (ref 0.0–0.2)

## 2022-11-08 LAB — CMP (CANCER CENTER ONLY)
ALT: 11 U/L (ref 0–44)
AST: 13 U/L — ABNORMAL LOW (ref 15–41)
Albumin: 4.1 g/dL (ref 3.5–5.0)
Alkaline Phosphatase: 65 U/L (ref 38–126)
Anion gap: 10 (ref 5–15)
BUN: 11 mg/dL (ref 8–23)
CO2: 24 mmol/L (ref 22–32)
Calcium: 8.8 mg/dL — ABNORMAL LOW (ref 8.9–10.3)
Chloride: 109 mmol/L (ref 98–111)
Creatinine: 1.22 mg/dL — ABNORMAL HIGH (ref 0.44–1.00)
GFR, Estimated: 45 mL/min — ABNORMAL LOW (ref >60)
Glucose, Bld: 116 mg/dL — ABNORMAL HIGH (ref 70–99)
Potassium: 3.9 mmol/L (ref 3.5–5.1)
Sodium: 143 mmol/L (ref 135–145)
Total Bilirubin: 0.2 mg/dL — ABNORMAL LOW (ref 0.3–1.2)
Total Protein: 6.5 g/dL (ref 6.5–8.1)

## 2022-11-08 MED ORDER — SODIUM CHLORIDE 0.9% FLUSH
10.0000 mL | Freq: Once | INTRAVENOUS | Status: AC
  Administered 2022-11-08: 10 mL

## 2022-11-08 MED ORDER — SODIUM CHLORIDE 0.9 % IV SOLN
16.0000 mg | Freq: Once | INTRAVENOUS | Status: AC
  Administered 2022-11-08: 16 mg via INTRAVENOUS
  Filled 2022-11-08: qty 1.6

## 2022-11-08 MED ORDER — CYANOCOBALAMIN 1000 MCG/ML IJ SOLN
1000.0000 ug | Freq: Once | INTRAMUSCULAR | Status: AC
  Administered 2022-11-08: 1000 ug via INTRAMUSCULAR
  Filled 2022-11-08: qty 1

## 2022-11-08 MED ORDER — SODIUM CHLORIDE 0.9% FLUSH
10.0000 mL | INTRAVENOUS | Status: DC | PRN
  Administered 2022-11-08: 10 mL

## 2022-11-08 MED ORDER — SODIUM CHLORIDE 0.9 % IV SOLN: Freq: Once | INTRAVENOUS | Status: AC

## 2022-11-08 MED ORDER — FAMOTIDINE IN NACL 20-0.9 MG/50ML-% IV SOLN
20.0000 mg | Freq: Once | INTRAVENOUS | Status: AC
  Administered 2022-11-08: 20 mg via INTRAVENOUS
  Filled 2022-11-08: qty 50

## 2022-11-08 MED ORDER — SODIUM CHLORIDE 0.9 % IV SOLN
16.0000 mg/kg | Freq: Once | INTRAVENOUS | Status: AC
  Administered 2022-11-08: 1300 mg via INTRAVENOUS
  Filled 2022-11-08: qty 5

## 2022-11-08 MED ORDER — ACETAMINOPHEN 325 MG PO TABS
650.0000 mg | ORAL_TABLET | Freq: Once | ORAL | Status: AC
  Administered 2022-11-08: 650 mg via ORAL
  Filled 2022-11-08: qty 2

## 2022-11-08 MED ORDER — HEPARIN SOD (PORK) LOCK FLUSH 100 UNIT/ML IV SOLN
500.0000 [IU] | Freq: Once | INTRAVENOUS | Status: AC | PRN
  Administered 2022-11-08: 500 [IU]

## 2022-11-08 MED ORDER — DIPHENHYDRAMINE HCL 25 MG PO CAPS
50.0000 mg | ORAL_CAPSULE | Freq: Once | ORAL | Status: AC
  Administered 2022-11-08: 50 mg via ORAL
  Filled 2022-11-08: qty 2

## 2022-11-08 NOTE — Patient Instructions (Signed)
[facility]  Discharge Instructions: Thank you for choosing [facility] to provide your oncology and hematology care.   If you have a lab appointment with the Cancer Center, please go directly to the Cancer Center and check in at the registration area.   Wear comfortable clothing and clothing appropriate for easy access to any Portacath or PICC line.   We strive to give you quality time with your provider. You may need to reschedule your appointment if you arrive late (15 or more minutes).  Arriving late affects you and other patients whose appointments are after yours.  Also, if you miss three or more appointments without notifying the office, you may be dismissed from the clinic at the provider's discretion.      For prescription refill requests, have your pharmacy contact our office and allow 72 hours for refills to be completed.    Today you received the following chemotherapy and/or immunotherapy agents: Darzalex    To help prevent nausea and vomiting after your treatment, we encourage you to take your nausea medication as directed.  BELOW ARE SYMPTOMS THAT SHOULD BE REPORTED IMMEDIATELY: *FEVER GREATER THAN 100.4 F (38 C) OR HIGHER *CHILLS OR SWEATING *NAUSEA AND VOMITING THAT IS NOT CONTROLLED WITH YOUR NAUSEA MEDICATION *UNUSUAL SHORTNESS OF BREATH *UNUSUAL BRUISING OR BLEEDING *URINARY PROBLEMS (pain or burning when urinating, or frequent urination) *BOWEL PROBLEMS (unusual diarrhea, constipation, pain near the anus) TENDERNESS IN MOUTH AND THROAT WITH OR WITHOUT PRESENCE OF ULCERS (sore throat, sores in mouth, or a toothache) UNUSUAL RASH, SWELLING OR PAIN  UNUSUAL VAGINAL DISCHARGE OR ITCHING   Items with * indicate a potential emergency and should be followed up as soon as possible or go to the Emergency Department if any problems should occur.  Please show the CHEMOTHERAPY ALERT CARD or IMMUNOTHERAPY ALERT CARD at check-in  to the Emergency Department and triage nurse.  Should you have questions after your visit or need to cancel or reschedule your appointment, please contact [facility]  Dept: [phone]  and follow the prompts.  Office hours are 8:00 a.m. to 4:30 p.m. Monday - Friday. Please note that voicemails left after 4:00 p.m. may not be returned until the following business day.  We are closed weekends and major holidays. You have access to a nurse at all times for urgent questions. Please call the main number to the clinic Dept: [phone] and follow the prompts.   For any non-urgent questions, you may also contact your provider using MyChart. We now offer e-Visits for anyone 18 and older to request care online for non-urgent symptoms. For details visit [email or web address].   Also download the MyChart app! Go to the app store, search "MyChart", open the app, select [facility], and log in with your MyChart username and password.   

## 2022-11-10 ENCOUNTER — Encounter: Payer: Self-pay | Admitting: Hematology

## 2022-11-10 NOTE — Progress Notes (Signed)
[facility] Follow up:   Date of Service: 11/10/22   Tracy Hatch, MD [address] [place] [address]  CC: Follow-up for continued evaluation and management of multiple myeloma  SUMMARY OF ONCOLOGIC HISTORY: Oncology History  History of right breast cancer  04/16/2000 Surgery   Left breast: Triple negative  invasive ductal cancer treated with lumpectomy, adjuvant chemotherapy, radiation , in [place], unknown stage   06/07/2015 Mammogram   Right breast mass 6x 6 x 5 mm, right axillary lymph node with slight cortex thickening measured 5 mm    06/13/2015 Initial Diagnosis   Right breast needle biopsy: Invasive ductal carcinoma, grade 1, right axillary lymph node biopsy negative , ER 95%, PR 5%, Ki-67 10%, HER-2 negative   06/13/2015 Clinical Stage   Stage IA: T1b N0   07/07/2015 Surgery   Right lumpectomy: Invasive ductal carcinoma grade 1, 1 cm span, with low-grade DCIS, DCIS focally 0.1 cm to inferior margin, 0/3 lymph nodes negative, ER 95%, PR 5%, HER-2 negative ratio 1.1, Ki-67 10%   07/07/2015 Pathologic Stage   Stage IA: T1c N0   07/13/2015 Procedure   Breast High/Moderate Risk Panel reveals no clinically significant variant at ATM, BRCA1, BRCA2, CDH1, CHEK2, PALB2, PTEN, and TP53.     08/23/2015 - 09/21/2015 Radiation Therapy   Adjuvant Radiation: Right breast/ 42.5Gy at 2.5 Gy per fraction x 17 fractions.   Right breast boost/ 7.5 Gy at 2.5 Gy per fraction x 3 fractions    Anti-estrogen oral therapy   Patient refused antiestrogen therapy   10/20/2015 Survivorship   Survivorship care plan completed and mailed to patient in lieu of in person visit at her request   Iron deficiency anemia  Multiple myeloma  01/25/2020 Initial Diagnosis   Multiple myeloma (HCC)   02/11/2020 - 05/19/2020 Adjuvant Chemotherapy   Daratumumab Weekly    02/29/2020 - 03/13/2020 Radiation Therapy   The targets were treated to a total dose of 20 Gy in 10  fractions of 2 Gy each to the left hip and L5 using one plan.   06/02/2020 - 10/05/2020 Adjuvant Chemotherapy   Daratumumab and Carfilzomib   10/19/2020 -  Adjuvant Chemotherapy   Maintenance Daratumumab--initially every 2 weeks, then every 4 weeks.    05/24/2022 -  Chemotherapy   Patient is on Treatment Plan : MYELOMA Daratumumab IV q28d     Malignant neoplasm metastatic to bone  02/08/2021 Initial Diagnosis   Bone metastases (HCC)   05/24/2022 -  Chemotherapy   Patient is on Treatment Plan : MYELOMA Daratumumab IV q28d       CURRENT THERAPY: Daratumumab/B12 injection   INTERVAL HISTORY:  Tracy [name] is here for continued evaluation and management of multiple myeloma. She was last seen by Dr. Candise Che on 10/11/2022.   Today, she reports that her energy levels are fairly stable. She does have some lower back pain that is triggered with standing long period of time. She is able to do her baseline ADLs on her own. She reports her appetite and weight are stable since the last visit. She denies nausea, vomiting or abdominal pain. Her bowel habits are unchanged without recurrent episodes of diarrhea or constipation. She denies easy bruising or signs of bleeding. She reports stable neuropathy in her hands that does affect her grip. She denies fevers, chills, sweats, shortness of breath, chest pain or cough. She has no other complaints.    Patient Active Problem List  Diagnosis Date Noted   RLS (restless legs syndrome) 06/12/2022   Malignant neoplasm metastatic to bone 02/08/2021   Angiodysplasia of intestine 08/23/2020   Port-A-Cath in place 08/04/2020   Counseling regarding advance care planning and goals of care 02/07/2020   Multiple myeloma 01/25/2020   Dizziness 10/12/2019   Post-nasal drainage 10/12/2019   Allergic rhinitis 08/19/2018   Type 2 diabetes mellitus with diabetic neuropathy, unspecified 11/05/2017   Encounter for long-term use of muscle relaxants 09/24/2017   CAD in  native artery 09/24/2017   OSA (obstructive sleep apnea) 09/24/2017   Snorings 09/24/2017   Asthenia 06/30/2017   Morbid obesity 06/02/2017   Depression 06/02/2017   Iron deficiency anemia 09/23/2016   Anemia of chronic disease 09/28/2015   Genetic testing 08/21/2015   History of left breast cancer 07/13/2015   History of right breast cancer 06/20/2015   Hearing loss due to cerumen impaction 12/29/2014   Allergy to adhesive tape 05/19/2014   Hyperlipidemia 12/06/2013   GERD (gastroesophageal reflux disease) 12/06/2013   Cervical disc disease 11/11/2013   Osteopenia 05/25/2013   Allergic asthma 12/24/2012   Insomnia 04/02/2012   Physical exam, annual 04/02/2012   HTN (hypertension) 02/03/2012   Vertigo, benign positional 02/03/2012   Left groin pain 02/03/2012   Hip pain 10/10/2011   TIA (transient ischemic attack) 07/24/2011   Allergic reaction 07/05/2011   Osteoarthrosis involving lower leg 10/19/2010   Disorder of bone and cartilage 05/01/2010   Generalized anxiety disorder 05/01/2010   Vitamin D deficiency 02/20/2010   Unspecified chronic bronchitis 08/24/2009   Other lymphedema 10/29/2007    is allergic to bacitracin-neomycin-polymyxin  [neomycin-bacitracin zn-polymyx], nsaids, tape, ambien [zolpidem tartrate], amoxicillin, clavulanic acid, contrast media [iodinated contrast media], latex, prednisone, and tessalon [benzonatate].  MEDICAL HISTORY: Past Medical History:  Diagnosis Date   Angiodysplasia of intestine 08/23/2020   Anxiety    Arthritis    Breast cancer (HCC) 06/13/15   Cancer (HCC) 2000   breast cancer   Chronic bronchitis (HCC)    Chronic bronchitis (HCC)    Hyperlipidemia    Hypertension    Myocardial infarction [facility]) 2001   Personal history of radiation therapy    Restless leg    Stroke (HCC) 2004   TIA, no deficits    SURGICAL HISTORY: Past Surgical History:  Procedure Laterality Date   ABDOMINAL HYSTERECTOMY  1985   BREAST LUMPECTOMY Left  2000   radiation and chemo   BREAST LUMPECTOMY Right 2016   radiation   BREAST SURGERY  2001   lt breast lumpectomy   COLONOSCOPY WITH ESOPHAGOGASTRODUODENOSCOPY (EGD)  04/2020   ENTEROSCOPY N/A 03/15/2021   Procedure: ENTEROSCOPY;  Surgeon: Rachael Fee, MD;  Location: [facility];  Service: Endoscopy;  Laterality: N/A;   GIVENS CAPSULE STUDY  07/2020   HOT HEMOSTASIS N/A 03/15/2021   Procedure: HOT HEMOSTASIS (ARGON PLASMA COAGULATION/BICAP);  Surgeon: Rachael Fee, MD;  Location: [facility];  Service: Endoscopy;  Laterality: N/A;   IR IMAGING GUIDED PORT INSERTION  04/25/2020   RADIOACTIVE SEED GUIDED PARTIAL MASTECTOMY WITH AXILLARY SENTINEL LYMPH NODE BIOPSY Right 07/07/2015   Procedure: RIGHT RADIOACTIVE SEED GUIDED PARTIAL MASTECTOMY WITH AXILLARY SENTINEL LYMPH NODE BIOPSY;  Surgeon: Chevis Pretty III, MD;  Location: [facility];  Service: General;  Laterality: Right;   SMALL INTESTINE SURGERY     TUBAL LIGATION     UPPER GASTROINTESTINAL ENDOSCOPY      SOCIAL HISTORY: Social History   Socioeconomic History   Marital status: Divorced  Spouse name: Not on file   Number of children: 7   Years of education: Not on file   Highest education level: Not on file  Occupational History   Occupation: retired  Tobacco Use   Smoking status: Former    Packs/day: 1.00    Years: 20.00    Additional pack years: 0.00    Total pack years: 20.00    Types: Cigarettes    Quit date: 07/30/2011    Years since quitting: 11.2   Smokeless tobacco: Never   Tobacco comments:    Quit >4 years ago; 1 ppd for about 5/20 years (remaining was less)  Vaping Use   Vaping Use: Former  Substance and Sexual Activity   Alcohol use: No    Alcohol/week: 0.0 standard drinks of alcohol   Drug use: No   Sexual activity: Not Currently  Other Topics Concern   Not on file  Social History Narrative   Lives alone.  Retired.  Education:  11th grade GED.  Children:  7 (one here).     Social Determinants of Health   Financial Resource Strain: High Risk (12/21/2021)   Overall Financial Resource Strain (CARDIA)    Difficulty of Paying Living Expenses: Hard  Food Insecurity: No Food Insecurity (08/02/2022)   Hunger Vital Sign    Worried About Running Out of Food in the Last Year: Never true    Ran Out of Food in the Last Year: Never true  Transportation Needs: No Transportation Needs (08/02/2022)   PRAPARE - Administrator, Civil Service (Medical): No    Lack of Transportation (Non-Medical): No  Physical Activity: Inactive (12/20/2021)   Exercise Vital Sign    Days of Exercise per Week: 0 days    Minutes of Exercise per Session: 0 min  Stress: No Stress Concern Present (12/20/2021)   [employer] of Occupational Health - Occupational Stress Questionnaire    Feeling of Stress : Not at all  Social Connections: Moderately Integrated (12/20/2021)   Social Connection and Isolation Panel [NHANES]    Frequency of Communication with Friends and Family: More than three times a week    Frequency of Social Gatherings with Friends and Family: More than three times a week    Attends Religious Services: More than 4 times per year    Active Member of [employer] or Organizations: Yes    Attends Engineer, structural: More than 4 times per year    Marital Status: Divorced  Intimate Partner Violence: Not At Risk (12/20/2021)   Humiliation, Afraid, Rape, and Kick questionnaire    Fear of Current or Ex-Partner: No    Emotionally Abused: No    Physically Abused: No    Sexually Abused: No    FAMILY HISTORY: Family History  Problem Relation Age of Onset   Emphysema Mother 89       smoker   Diabetes Father    Lung cancer Sister        dx. <50; former smoker   Diabetes Brother    Diabetes Brother    Brain cancer Brother 90       unknown tumor type   Diabetes Paternal Aunt    Stroke Maternal Grandmother    Diabetes Paternal Grandmother    Cancer Daughter 45        neck cancer   Other Daughter        hysterectomy for unspecified reason   Colon cancer Daughter    Breast cancer Cousin  Cancer Cousin        unspecified type   Breast cancer Other        triple negative breast cancer in her 104s   Colon polyps Neg Hx    Esophageal cancer Neg Hx    Gallbladder disease Neg Hx    ROS  10 Point review of Systems was done is negative except as noted above.   PHYSICAL EXAMINATION  ECOG PERFORMANCE STATUS: 1 - Symptomatic but completely ambulatory  Vitals:   11/08/22 1029  BP: 138/64  Pulse: 78  Resp: 17  Temp: 97.6 F (36.4 C)  SpO2: 100%   GENERAL:alert, in no acute distress and comfortable SKIN: no acute rashes, no significant lesions EYES: conjunctiva are pink and non-injected, sclera anicteric LYMPH:  no palpable lymphadenopathy in the cervical or supraclavicular regions LUNGS: clear to auscultation b/l with normal respiratory effort Extremity: no pedal edema PSYCH: alert & oriented x 3 with fluent speech NEURO: no focal motor/sensory deficits    LABORATORY DATA: .    Latest Ref Rng & Units 11/08/2022   10:02 AM 10/11/2022   10:57 AM 09/13/2022   11:35 AM  CBC  WBC 4.0 - 10.5 K/uL 4.6  5.0  5.2   Hemoglobin 12.0 - 15.0 g/dL 66.0  60.0  45.9   Hematocrit 36.0 - 46.0 % 29.0  30.9  29.7   Platelets 150 - 400 K/uL 273  256  288    .    Latest Ref Rng & Units 11/08/2022   10:02 AM 10/11/2022   10:57 AM 09/13/2022   11:35 AM  CMP  Glucose 70 - 99 mg/dL 977  414  239   BUN 8 - 23 mg/dL 11  14  15    Creatinine 0.44 - 1.00 mg/dL 5.32  0.23  3.43   Sodium 135 - 145 mmol/L 143  142  141   Potassium 3.5 - 5.1 mmol/L 3.9  4.1  3.9   Chloride 98 - 111 mmol/L 109  107  107   CO2 22 - 32 mmol/L 24  25  24    Calcium 8.9 - 10.3 mg/dL 8.8  9.3  8.8   Total Protein 6.5 - 8.1 g/dL 6.5  6.7  6.4   Total Bilirubin 0.3 - 1.2 mg/dL 0.2  0.3  0.3   Alkaline Phos 38 - 126 U/L 65  71  71   AST 15 - 41 U/L 13  14  14    ALT 0 - 44 U/L 11  11   12     ASSESSMENT and THERAPY PLAN:  Tracy [name] is a 77 y.o. female who presents for follow-up of multiple myeloma.   1)  IgG Kappa Multiple myeloma with bone lesions, anemia, renal insufficiency: -Baseline M spike was measuring 3.7g/dl at diagnosis. -Cytogenetics showed 1p deletion, polymorphic variant, 13q deletion -Currently on monthly daratumumab  2) h/o DM2 3) Diabetic Neuropathy 4) CKD - likely from DM2, but could have an element of myeloma kidney. 5) h/o TIA and AMI 6) Iron deficiency 7) B12 deficiency   PLAN: -Labs from today reviewed and require no intervention. Stable anemia with Hgb 10.0. No other cytopenias. Stable creatinine level of 1.22. SPEP pending today. -Most recent SPEP/IFE form 10/11/2022 showed no M protein was detected.  -No prohibitive toxicities identified.  -she will continue monthly daratumumab and B12 injections, and pamidronate every 8 weeks   Follow-up: Per integrated scheduling  All of the patient's questions were answered with apparent satisfaction. The patient knows  to call the clinic with any problems, questions or concerns.  [name] have spent a total of 30 minutes minutes of face-to-face and non-face-to-face time, preparing to see the patient, performing a medically appropriate examination, counseling and educating the patient, documenting clinical information in the electronic health record, and care coordination.   Georga Kaufmann PA-C Dept of Hematology and Oncology [facility] Phone: [phone]

## 2022-11-11 ENCOUNTER — Other Ambulatory Visit: Payer: Self-pay | Admitting: Hematology

## 2022-11-12 ENCOUNTER — Other Ambulatory Visit: Payer: Self-pay

## 2022-11-13 LAB — MULTIPLE MYELOMA PANEL, SERUM
Albumin SerPl Elph-Mcnc: 3.4 g/dL (ref 2.9–4.4)
Albumin/Glob SerPl: 1.5 (ref 0.7–1.7)
Alpha 1: 0.2 g/dL (ref 0.0–0.4)
Alpha2 Glob SerPl Elph-Mcnc: 1.1 g/dL — ABNORMAL HIGH (ref 0.4–1.0)
B-Globulin SerPl Elph-Mcnc: 0.8 g/dL (ref 0.7–1.3)
Gamma Glob SerPl Elph-Mcnc: 0.3 g/dL — ABNORMAL LOW (ref 0.4–1.8)
Globulin, Total: 2.4 g/dL (ref 2.2–3.9)
IgA: 68 mg/dL (ref 64–422)
IgG (Immunoglobin G), Serum: 413 mg/dL — ABNORMAL LOW (ref 586–1602)
IgM (Immunoglobulin M), Srm: 39 mg/dL (ref 26–217)
Total Protein ELP: 5.8 g/dL — ABNORMAL LOW (ref 6.0–8.5)

## 2022-11-15 ENCOUNTER — Inpatient Hospital Stay: Payer: Medicare Other

## 2022-11-15 ENCOUNTER — Other Ambulatory Visit: Payer: Self-pay | Admitting: *Deleted

## 2022-11-15 ENCOUNTER — Other Ambulatory Visit: Payer: Self-pay

## 2022-11-15 VITALS — BP 143/55 | HR 81 | Temp 98.5°F | Resp 16

## 2022-11-15 DIAGNOSIS — Z79899 Other long term (current) drug therapy: Secondary | ICD-10-CM | POA: Diagnosis not present

## 2022-11-15 DIAGNOSIS — Z8673 Personal history of transient ischemic attack (TIA), and cerebral infarction without residual deficits: Secondary | ICD-10-CM | POA: Diagnosis not present

## 2022-11-15 DIAGNOSIS — Z853 Personal history of malignant neoplasm of breast: Secondary | ICD-10-CM | POA: Diagnosis not present

## 2022-11-15 DIAGNOSIS — Z7189 Other specified counseling: Secondary | ICD-10-CM

## 2022-11-15 DIAGNOSIS — C7951 Secondary malignant neoplasm of bone: Secondary | ICD-10-CM

## 2022-11-15 DIAGNOSIS — C9000 Multiple myeloma not having achieved remission: Secondary | ICD-10-CM

## 2022-11-15 DIAGNOSIS — Z95828 Presence of other vascular implants and grafts: Secondary | ICD-10-CM

## 2022-11-15 DIAGNOSIS — Z5112 Encounter for antineoplastic immunotherapy: Secondary | ICD-10-CM | POA: Diagnosis not present

## 2022-11-15 DIAGNOSIS — E1140 Type 2 diabetes mellitus with diabetic neuropathy, unspecified: Secondary | ICD-10-CM | POA: Diagnosis not present

## 2022-11-15 DIAGNOSIS — E1122 Type 2 diabetes mellitus with diabetic chronic kidney disease: Secondary | ICD-10-CM | POA: Diagnosis not present

## 2022-11-15 DIAGNOSIS — Z87891 Personal history of nicotine dependence: Secondary | ICD-10-CM | POA: Diagnosis not present

## 2022-11-15 DIAGNOSIS — D509 Iron deficiency anemia, unspecified: Secondary | ICD-10-CM | POA: Diagnosis not present

## 2022-11-15 DIAGNOSIS — N189 Chronic kidney disease, unspecified: Secondary | ICD-10-CM | POA: Diagnosis not present

## 2022-11-15 DIAGNOSIS — G2581 Restless legs syndrome: Secondary | ICD-10-CM | POA: Diagnosis not present

## 2022-11-15 DIAGNOSIS — Z803 Family history of malignant neoplasm of breast: Secondary | ICD-10-CM | POA: Diagnosis not present

## 2022-11-15 DIAGNOSIS — E538 Deficiency of other specified B group vitamins: Secondary | ICD-10-CM | POA: Diagnosis not present

## 2022-11-15 DIAGNOSIS — Z800 Family history of malignant neoplasm of digestive organs: Secondary | ICD-10-CM | POA: Diagnosis not present

## 2022-11-15 DIAGNOSIS — Z923 Personal history of irradiation: Secondary | ICD-10-CM | POA: Diagnosis not present

## 2022-11-15 DIAGNOSIS — M545 Low back pain, unspecified: Secondary | ICD-10-CM | POA: Diagnosis not present

## 2022-11-15 DIAGNOSIS — I129 Hypertensive chronic kidney disease with stage 1 through stage 4 chronic kidney disease, or unspecified chronic kidney disease: Secondary | ICD-10-CM | POA: Diagnosis not present

## 2022-11-15 DIAGNOSIS — I252 Old myocardial infarction: Secondary | ICD-10-CM | POA: Diagnosis not present

## 2022-11-15 DIAGNOSIS — G4733 Obstructive sleep apnea (adult) (pediatric): Secondary | ICD-10-CM | POA: Diagnosis not present

## 2022-11-15 LAB — CBC WITH DIFFERENTIAL (CANCER CENTER ONLY)
Abs Immature Granulocytes: 0.01 10*3/uL (ref 0.00–0.07)
Basophils Absolute: 0 10*3/uL (ref 0.0–0.1)
Basophils Relative: 1 %
Eosinophils Absolute: 0.1 10*3/uL (ref 0.0–0.5)
Eosinophils Relative: 1 %
HCT: 29.3 % — ABNORMAL LOW (ref 36.0–46.0)
Hemoglobin: 9.9 g/dL — ABNORMAL LOW (ref 12.0–15.0)
Immature Granulocytes: 0 %
Lymphocytes Relative: 33 %
Lymphs Abs: 1.8 10*3/uL (ref 0.7–4.0)
MCH: 31.9 pg (ref 26.0–34.0)
MCHC: 33.8 g/dL (ref 30.0–36.0)
MCV: 94.5 fL (ref 80.0–100.0)
Monocytes Absolute: 0.4 10*3/uL (ref 0.1–1.0)
Monocytes Relative: 6 %
Neutro Abs: 3.2 10*3/uL (ref 1.7–7.7)
Neutrophils Relative %: 59 %
Platelet Count: 303 10*3/uL (ref 150–400)
RBC: 3.1 MIL/uL — ABNORMAL LOW (ref 3.87–5.11)
RDW: 13.5 % (ref 11.5–15.5)
WBC Count: 5.5 10*3/uL (ref 4.0–10.5)
nRBC: 0 % (ref 0.0–0.2)

## 2022-11-15 LAB — CMP (CANCER CENTER ONLY)
ALT: 9 U/L (ref 0–44)
AST: 11 U/L — ABNORMAL LOW (ref 15–41)
Albumin: 4.1 g/dL (ref 3.5–5.0)
Alkaline Phosphatase: 69 U/L (ref 38–126)
Anion gap: 9 (ref 5–15)
BUN: 11 mg/dL (ref 8–23)
CO2: 25 mmol/L (ref 22–32)
Calcium: 9.6 mg/dL (ref 8.9–10.3)
Chloride: 107 mmol/L (ref 98–111)
Creatinine: 1.25 mg/dL — ABNORMAL HIGH (ref 0.44–1.00)
GFR, Estimated: 44 mL/min — ABNORMAL LOW (ref >60)
Glucose, Bld: 160 mg/dL — ABNORMAL HIGH (ref 70–99)
Potassium: 3.8 mmol/L (ref 3.5–5.1)
Sodium: 141 mmol/L (ref 135–145)
Total Bilirubin: 0.3 mg/dL (ref 0.3–1.2)
Total Protein: 6.4 g/dL — ABNORMAL LOW (ref 6.5–8.1)

## 2022-11-15 MED ORDER — SODIUM CHLORIDE 0.9% FLUSH
10.0000 mL | Freq: Once | INTRAVENOUS | Status: AC
  Administered 2022-11-15: 10 mL

## 2022-11-15 MED ORDER — SODIUM CHLORIDE 0.9 % IV SOLN
60.0000 mg | Freq: Once | INTRAVENOUS | Status: AC
  Administered 2022-11-15: 60 mg via INTRAVENOUS
  Filled 2022-11-15: qty 20

## 2022-11-15 MED ORDER — HEPARIN SOD (PORK) LOCK FLUSH 100 UNIT/ML IV SOLN
500.0000 [IU] | Freq: Once | INTRAVENOUS | Status: AC
  Administered 2022-11-15: 500 [IU]

## 2022-11-15 MED ORDER — SODIUM CHLORIDE 0.9 % IV SOLN: INTRAVENOUS | Status: DC

## 2022-11-15 NOTE — Patient Instructions (Signed)
Pamidronate Injection What is this medication? PAMIDRONATE (pa mi DROE nate) treats high calcium levels in the blood caused by cancer. It may also be used with chemotherapy to treat weakened bones caused by cancer. It can also be used to treat Paget's disease of the bone. It works by slowing down the release of calcium from bones. This lowers calcium levels in your blood. It also makes your bones stronger and less likely to break (fracture). It belongs to a group of medications called bisphosphonates. This medicine may be used for other purposes; ask your health care provider or pharmacist if you have questions. COMMON BRAND NAME(S): Aredia What should I tell my care team before I take this medication? They need to know if you have any of these conditions: Bleeding disorder Cancer Dental disease Kidney disease Low levels of calcium or other minerals in the blood Low red blood cell counts Receiving steroids, such as dexamethasone or prednisone An unusual or allergic reaction to pamidronate, other medications, foods, dyes or preservatives Pregnant or trying to get pregnant Breast-feeding How should I use this medication? This medication is injected into a vein. It is given by your care team in a hospital or clinic setting. Talk to your care team about the use of this medication in children. Special care may be needed. Overdosage: If you think you have taken too much of this medicine contact a poison control center or emergency room at once. NOTE: This medicine is only for you. Do not share this medicine with others. What if I miss a dose? Keep appointments for follow-up doses. It is important not to miss your dose. Call your care team if you are unable to keep an appointment. What may interact with this medication? Certain antibiotics given by injection Medications for inflammation or pain, such as ibuprofen, naproxen Some diuretics, such as bumetanide, furosemide Cyclosporine Parathyroid  hormone Tacrolimus Teriparatide Thalidomide This list may not describe all possible interactions. Give your health care provider a list of all the medicines, herbs, non-prescription drugs, or dietary supplements you use. Also tell them if you smoke, drink alcohol, or use illegal drugs. Some items may interact with your medicine. What should I watch for while using this medication? Visit your care team for regular checks on your progress. It may be some time before you see the benefit from this medication. Some people who take this medication have severe bone, joint, or muscle pain. This medication may also increase your risk for jaw problems or a broken thigh bone. Tell your care team right away if you have severe pain in your jaw, bones, joints, or muscles. Tell your care team if you have any pain that does not go away or that gets worse. Tell your dentist and dental surgeon that you are taking this medication. You should not have major dental surgery while on this medication. See your dentist to have a dental exam and fix any dental problems before starting this medication. Take good care of your teeth while on this medication. Make sure you see your dentist for regular follow-up appointments. You should make sure you get enough calcium and vitamin D while you are taking this medication. Discuss the foods you eat and the vitamins you take with your care team. You may need bloodwork while you are taking this medication. Talk to your care team if you wish to become pregnant or think you might be pregnant. This medication can cause serious birth defects. What side effects may I notice from receiving   this medication? Side effects that you should report to your care team as soon as possible: Allergic reactions--skin rash, itching, hives, swelling of the face, lips, tongue, or throat Kidney injury--decrease in the amount of urine, swelling of the ankles, hands, or feet Low calcium level--muscle pain or  cramps, confusion, tingling, or numbness in the hands or feet Osteonecrosis of the jaw--pain, swelling, or redness in the mouth, numbness of the jaw, poor healing after dental work, unusual discharge from the mouth, visible bones in the mouth Severe bone, joint, or muscle pain Side effects that usually do not require medical attention (report to your care team if they continue or are bothersome): Constipation Fatigue Fever Loss of appetite Nausea Pain, redness, or irritation at injection site Stomach pain This list may not describe all possible side effects. Call your doctor for medical advice about side effects. You may report side effects to FDA at 1-800-FDA-1088. Where should I keep my medication? This medication is given in a hospital or clinic. It will not be stored at home. NOTE: This sheet is a summary. It may not cover all possible information. If you have questions about this medicine, talk to your doctor, pharmacist, or health care provider.  2023 Elsevier/Gold Standard (2021-08-30 00:00:00)  

## 2022-11-21 ENCOUNTER — Other Ambulatory Visit: Payer: Self-pay | Admitting: Family Medicine

## 2022-12-01 ENCOUNTER — Other Ambulatory Visit: Payer: Self-pay | Admitting: Hematology

## 2022-12-01 DIAGNOSIS — C7951 Secondary malignant neoplasm of bone: Secondary | ICD-10-CM

## 2022-12-02 ENCOUNTER — Encounter: Payer: Self-pay | Admitting: Hematology

## 2022-12-06 ENCOUNTER — Other Ambulatory Visit: Payer: Self-pay

## 2022-12-06 ENCOUNTER — Inpatient Hospital Stay: Payer: Medicare Other | Attending: Hematology

## 2022-12-06 ENCOUNTER — Inpatient Hospital Stay (HOSPITAL_BASED_OUTPATIENT_CLINIC_OR_DEPARTMENT_OTHER): Payer: Medicare Other | Admitting: Hematology

## 2022-12-06 ENCOUNTER — Inpatient Hospital Stay: Payer: Medicare Other

## 2022-12-06 VITALS — BP 141/90 | HR 88 | Temp 97.8°F | Resp 16

## 2022-12-06 DIAGNOSIS — G4733 Obstructive sleep apnea (adult) (pediatric): Secondary | ICD-10-CM | POA: Insufficient documentation

## 2022-12-06 DIAGNOSIS — C7951 Secondary malignant neoplasm of bone: Secondary | ICD-10-CM

## 2022-12-06 DIAGNOSIS — Z803 Family history of malignant neoplasm of breast: Secondary | ICD-10-CM | POA: Insufficient documentation

## 2022-12-06 DIAGNOSIS — Z95828 Presence of other vascular implants and grafts: Secondary | ICD-10-CM

## 2022-12-06 DIAGNOSIS — Z853 Personal history of malignant neoplasm of breast: Secondary | ICD-10-CM | POA: Insufficient documentation

## 2022-12-06 DIAGNOSIS — Z5112 Encounter for antineoplastic immunotherapy: Secondary | ICD-10-CM | POA: Insufficient documentation

## 2022-12-06 DIAGNOSIS — E538 Deficiency of other specified B group vitamins: Secondary | ICD-10-CM | POA: Insufficient documentation

## 2022-12-06 DIAGNOSIS — C9000 Multiple myeloma not having achieved remission: Secondary | ICD-10-CM | POA: Diagnosis not present

## 2022-12-06 DIAGNOSIS — I2510 Atherosclerotic heart disease of native coronary artery without angina pectoris: Secondary | ICD-10-CM | POA: Insufficient documentation

## 2022-12-06 DIAGNOSIS — G2581 Restless legs syndrome: Secondary | ICD-10-CM | POA: Diagnosis not present

## 2022-12-06 DIAGNOSIS — E1122 Type 2 diabetes mellitus with diabetic chronic kidney disease: Secondary | ICD-10-CM | POA: Insufficient documentation

## 2022-12-06 DIAGNOSIS — Z90710 Acquired absence of both cervix and uterus: Secondary | ICD-10-CM | POA: Insufficient documentation

## 2022-12-06 DIAGNOSIS — I252 Old myocardial infarction: Secondary | ICD-10-CM | POA: Diagnosis not present

## 2022-12-06 DIAGNOSIS — Z923 Personal history of irradiation: Secondary | ICD-10-CM | POA: Diagnosis not present

## 2022-12-06 DIAGNOSIS — Z800 Family history of malignant neoplasm of digestive organs: Secondary | ICD-10-CM | POA: Insufficient documentation

## 2022-12-06 DIAGNOSIS — N189 Chronic kidney disease, unspecified: Secondary | ICD-10-CM | POA: Diagnosis not present

## 2022-12-06 DIAGNOSIS — D509 Iron deficiency anemia, unspecified: Secondary | ICD-10-CM | POA: Insufficient documentation

## 2022-12-06 DIAGNOSIS — G629 Polyneuropathy, unspecified: Secondary | ICD-10-CM | POA: Diagnosis not present

## 2022-12-06 DIAGNOSIS — Z79899 Other long term (current) drug therapy: Secondary | ICD-10-CM | POA: Insufficient documentation

## 2022-12-06 DIAGNOSIS — Z7189 Other specified counseling: Secondary | ICD-10-CM

## 2022-12-06 DIAGNOSIS — Z8673 Personal history of transient ischemic attack (TIA), and cerebral infarction without residual deficits: Secondary | ICD-10-CM | POA: Insufficient documentation

## 2022-12-06 DIAGNOSIS — Z87891 Personal history of nicotine dependence: Secondary | ICD-10-CM | POA: Diagnosis not present

## 2022-12-06 DIAGNOSIS — I129 Hypertensive chronic kidney disease with stage 1 through stage 4 chronic kidney disease, or unspecified chronic kidney disease: Secondary | ICD-10-CM | POA: Insufficient documentation

## 2022-12-06 DIAGNOSIS — E1140 Type 2 diabetes mellitus with diabetic neuropathy, unspecified: Secondary | ICD-10-CM | POA: Insufficient documentation

## 2022-12-06 DIAGNOSIS — M545 Low back pain, unspecified: Secondary | ICD-10-CM | POA: Diagnosis not present

## 2022-12-06 DIAGNOSIS — Z801 Family history of malignant neoplasm of trachea, bronchus and lung: Secondary | ICD-10-CM | POA: Insufficient documentation

## 2022-12-06 LAB — CMP (CANCER CENTER ONLY)
ALT: 11 U/L (ref 0–44)
AST: 14 U/L — ABNORMAL LOW (ref 15–41)
Albumin: 4.2 g/dL (ref 3.5–5.0)
Alkaline Phosphatase: 65 U/L (ref 38–126)
Anion gap: 11 (ref 5–15)
BUN: 15 mg/dL (ref 8–23)
CO2: 24 mmol/L (ref 22–32)
Calcium: 9.2 mg/dL (ref 8.9–10.3)
Chloride: 108 mmol/L (ref 98–111)
Creatinine: 1.37 mg/dL — ABNORMAL HIGH (ref 0.44–1.00)
GFR, Estimated: 40 mL/min — ABNORMAL LOW (ref >60)
Glucose, Bld: 164 mg/dL — ABNORMAL HIGH (ref 70–99)
Potassium: 4 mmol/L (ref 3.5–5.1)
Sodium: 143 mmol/L (ref 135–145)
Total Bilirubin: 0.3 mg/dL (ref 0.3–1.2)
Total Protein: 6.6 g/dL (ref 6.5–8.1)

## 2022-12-06 LAB — CBC WITH DIFFERENTIAL (CANCER CENTER ONLY)
Abs Immature Granulocytes: 0.02 10*3/uL (ref 0.00–0.07)
Basophils Absolute: 0 10*3/uL (ref 0.0–0.1)
Basophils Relative: 1 %
Eosinophils Absolute: 0.1 10*3/uL (ref 0.0–0.5)
Eosinophils Relative: 1 %
HCT: 30.1 % — ABNORMAL LOW (ref 36.0–46.0)
Hemoglobin: 10.4 g/dL — ABNORMAL LOW (ref 12.0–15.0)
Immature Granulocytes: 0 %
Lymphocytes Relative: 38 %
Lymphs Abs: 1.7 10*3/uL (ref 0.7–4.0)
MCH: 32.3 pg (ref 26.0–34.0)
MCHC: 34.6 g/dL (ref 30.0–36.0)
MCV: 93.5 fL (ref 80.0–100.0)
Monocytes Absolute: 0.3 10*3/uL (ref 0.1–1.0)
Monocytes Relative: 7 %
Neutro Abs: 2.4 10*3/uL (ref 1.7–7.7)
Neutrophils Relative %: 53 %
Platelet Count: 296 10*3/uL (ref 150–400)
RBC: 3.22 MIL/uL — ABNORMAL LOW (ref 3.87–5.11)
RDW: 13.1 % (ref 11.5–15.5)
WBC Count: 4.5 10*3/uL (ref 4.0–10.5)
nRBC: 0 % (ref 0.0–0.2)

## 2022-12-06 MED ORDER — SODIUM CHLORIDE 0.9 % IV SOLN
16.0000 mg/kg | Freq: Once | INTRAVENOUS | Status: AC
  Administered 2022-12-06: 1300 mg via INTRAVENOUS
  Filled 2022-12-06: qty 5

## 2022-12-06 MED ORDER — SODIUM CHLORIDE 0.9% FLUSH
10.0000 mL | INTRAVENOUS | Status: DC | PRN
  Administered 2022-12-06: 10 mL

## 2022-12-06 MED ORDER — SODIUM CHLORIDE 0.9 % IV SOLN: Freq: Once | INTRAVENOUS | Status: AC

## 2022-12-06 MED ORDER — CYANOCOBALAMIN 1000 MCG/ML IJ SOLN
1000.0000 ug | Freq: Once | INTRAMUSCULAR | Status: AC
  Administered 2022-12-06: 1000 ug via INTRAMUSCULAR
  Filled 2022-12-06: qty 1

## 2022-12-06 MED ORDER — SODIUM CHLORIDE 0.9% FLUSH
10.0000 mL | Freq: Once | INTRAVENOUS | Status: AC
  Administered 2022-12-06: 10 mL

## 2022-12-06 MED ORDER — SODIUM CHLORIDE 0.9 % IV SOLN
16.0000 mg | Freq: Once | INTRAVENOUS | Status: AC
  Administered 2022-12-06: 16 mg via INTRAVENOUS
  Filled 2022-12-06: qty 1.6

## 2022-12-06 MED ORDER — DIPHENHYDRAMINE HCL 25 MG PO CAPS
50.0000 mg | ORAL_CAPSULE | Freq: Once | ORAL | Status: AC
  Administered 2022-12-06: 50 mg via ORAL
  Filled 2022-12-06: qty 2

## 2022-12-06 MED ORDER — HEPARIN SOD (PORK) LOCK FLUSH 100 UNIT/ML IV SOLN
500.0000 [IU] | Freq: Once | INTRAVENOUS | Status: AC | PRN
  Administered 2022-12-06: 500 [IU]

## 2022-12-06 MED ORDER — ACETAMINOPHEN 325 MG PO TABS
650.0000 mg | ORAL_TABLET | Freq: Once | ORAL | Status: AC
  Administered 2022-12-06: 650 mg via ORAL
  Filled 2022-12-06: qty 2

## 2022-12-06 MED ORDER — FAMOTIDINE 20 MG IN NS 100 ML IVPB
20.0000 mg | Freq: Once | INTRAVENOUS | Status: AC
  Administered 2022-12-06: 20 mg via INTRAVENOUS
  Filled 2022-12-06: qty 100

## 2022-12-06 NOTE — Progress Notes (Signed)
[facility] Follow up:   Date of Service: 12/06/22   Tracy Hatch, MD [address] [place] [address]  CC: Follow-up for continued evaluation and management of multiple myeloma  SUMMARY OF ONCOLOGIC HISTORY: Oncology History  History of right breast cancer  04/16/2000 Surgery   Left breast: Triple negative  invasive ductal cancer treated with lumpectomy, adjuvant chemotherapy, radiation , in [place], unknown stage   06/07/2015 Mammogram   Right breast mass 6x 6 x 5 mm, right axillary lymph node with slight cortex thickening measured 5 mm    06/13/2015 Initial Diagnosis   Right breast needle biopsy: Invasive ductal carcinoma, grade 1, right axillary lymph node biopsy negative , ER 95%, PR 5%, Ki-67 10%, HER-2 negative   06/13/2015 Clinical Stage   Stage IA: T1b N0   07/07/2015 Surgery   Right lumpectomy: Invasive ductal carcinoma grade 1, 1 cm span, with low-grade DCIS, DCIS focally 0.1 cm to inferior margin, 0/3 lymph nodes negative, ER 95%, PR 5%, HER-2 negative ratio 1.1, Ki-67 10%   07/07/2015 Pathologic Stage   Stage IA: T1c N0   07/13/2015 Procedure   Breast High/Moderate Risk Panel reveals no clinically significant variant at ATM, BRCA1, BRCA2, CDH1, CHEK2, PALB2, PTEN, and TP53.     08/23/2015 - 09/21/2015 Radiation Therapy   Adjuvant Radiation: Right breast/ 42.5Gy at 2.5 Gy per fraction x 17 fractions.   Right breast boost/ 7.5 Gy at 2.5 Gy per fraction x 3 fractions    Anti-estrogen oral therapy   Patient refused antiestrogen therapy   10/20/2015 Survivorship   Survivorship care plan completed and mailed to patient in lieu of in person visit at her request   Iron deficiency anemia  Multiple myeloma (HCC)  01/25/2020 Initial Diagnosis   Multiple myeloma (HCC)   02/11/2020 - 05/19/2020 Adjuvant Chemotherapy   Daratumumab Weekly    02/29/2020 - 03/13/2020 Radiation Therapy   The targets were treated to a total dose of 20 Gy in 10  fractions of 2 Gy each to the left hip and L5 using one plan.   06/02/2020 - 10/05/2020 Adjuvant Chemotherapy   Daratumumab and Carfilzomib   10/19/2020 -  Adjuvant Chemotherapy   Maintenance Daratumumab--initially every 2 weeks, then every 4 weeks.    05/24/2022 -  Chemotherapy   Patient is on Treatment Plan : MYELOMA Daratumumab IV q28d     Malignant neoplasm metastatic to bone (HCC)  02/08/2021 Initial Diagnosis   Bone metastases (HCC)   05/24/2022 -  Chemotherapy   Patient is on Treatment Plan : MYELOMA Daratumumab IV q28d       CURRENT THERAPY: Daratumumab/B12 injection   INTERVAL HISTORY:  Tracy [name] is a 77 y.o. female here for continued evaluation and management of multiple myeloma. She was last seen by me on 10/11/2022 and complained of soreness in her right upper extremity as well as discomfort and heaviness in her bilateral lower extremities.  Patient was most recently seen  by Thayil PA on 11/08/2022 and reported lower back pain triggered by extensive periods of standing as well as stable neuropathy.  Today, she reports that she has been feeling well overall. Patient complains of neuropathy and she notes that she has an appointment with a neurologist soon. She currently takes 2 tablets of Gabapentin at night. She has not been staying active as she reports that she is limited by neuropathy in her lower extremities.  She reports a previous right axillary lymph node.  She previously felt tenderness in the area. Her lymph node has since resolved.   She reports that her bowel habits have improved. She denies any abdominal pain. Patient denies any issues tolerating Daratumumab. Patient denies any major medication changes, new bone pains, fever, chills, or infection.  Patient Active Problem List   Diagnosis Date Noted   RLS (restless legs syndrome) 06/12/2022   Malignant neoplasm metastatic to bone (HCC) 02/08/2021   Angiodysplasia of intestine 08/23/2020   Port-A-Cath in  place 08/04/2020   Counseling regarding advance care planning and goals of care 02/07/2020   Multiple myeloma (HCC) 01/25/2020   Dizziness 10/12/2019   Post-nasal drainage 10/12/2019   Allergic rhinitis 08/19/2018   Type 2 diabetes mellitus with diabetic neuropathy, unspecified (HCC) 11/05/2017   Encounter for long-term use of muscle relaxants 09/24/2017   CAD in native artery 09/24/2017   OSA (obstructive sleep apnea) 09/24/2017   Snorings 09/24/2017   Asthenia 06/30/2017   Morbid obesity (HCC) 06/02/2017   Depression 06/02/2017   Iron deficiency anemia 09/23/2016   Anemia of chronic disease 09/28/2015   Genetic testing 08/21/2015   History of left breast cancer 07/13/2015   History of right breast cancer 06/20/2015   Hearing loss due to cerumen impaction 12/29/2014   Allergy to adhesive tape 05/19/2014   Hyperlipidemia 12/06/2013   GERD (gastroesophageal reflux disease) 12/06/2013   Cervical disc disease 11/11/2013   Osteopenia 05/25/2013   Allergic asthma 12/24/2012   Insomnia 04/02/2012   Physical exam, annual 04/02/2012   HTN (hypertension) 02/03/2012   Vertigo, benign positional 02/03/2012   Left groin pain 02/03/2012   Hip pain 10/10/2011   TIA (transient ischemic attack) 07/24/2011   Allergic reaction 07/05/2011   Osteoarthrosis involving lower leg 10/19/2010   Disorder of bone and cartilage 05/01/2010   Generalized anxiety disorder 05/01/2010   Vitamin D deficiency 02/20/2010   Unspecified chronic bronchitis (HCC) 08/24/2009   Other lymphedema 10/29/2007    is allergic to bacitracin-neomycin-polymyxin  [neomycin-bacitracin zn-polymyx], nsaids, tape, ambien [zolpidem tartrate], amoxicillin, clavulanic acid, contrast media [iodinated contrast media], latex, prednisone, and tessalon [benzonatate].  MEDICAL HISTORY: Past Medical History:  Diagnosis Date   Angiodysplasia of intestine 08/23/2020   Anxiety    Arthritis    Breast cancer (HCC) 06/13/15   Cancer (HCC)  2000   breast cancer   Chronic bronchitis (HCC)    Chronic bronchitis (HCC)    Hyperlipidemia    Hypertension    Myocardial infarction [facility]) 2001   Personal history of radiation therapy    Restless leg    Stroke (HCC) 2004   TIA, no deficits    SURGICAL HISTORY: Past Surgical History:  Procedure Laterality Date   ABDOMINAL HYSTERECTOMY  1985   BREAST LUMPECTOMY Left 2000   radiation and chemo   BREAST LUMPECTOMY Right 2016   radiation   BREAST SURGERY  2001   lt breast lumpectomy   COLONOSCOPY WITH ESOPHAGOGASTRODUODENOSCOPY (EGD)  04/2020   ENTEROSCOPY N/A 03/15/2021   Procedure: ENTEROSCOPY;  Surgeon: Rachael Fee, MD;  Location: [facility];  Service: Endoscopy;  Laterality: N/A;   GIVENS CAPSULE STUDY  07/2020   HOT HEMOSTASIS N/A 03/15/2021   Procedure: HOT HEMOSTASIS (ARGON PLASMA COAGULATION/BICAP);  Surgeon: Rachael Fee, MD;  Location: [facility];  Service: Endoscopy;  Laterality: N/A;   IR IMAGING GUIDED PORT INSERTION  04/25/2020   RADIOACTIVE SEED GUIDED PARTIAL MASTECTOMY WITH AXILLARY SENTINEL LYMPH NODE BIOPSY Right 07/07/2015   Procedure: RIGHT RADIOACTIVE SEED GUIDED PARTIAL MASTECTOMY WITH  AXILLARY SENTINEL LYMPH NODE BIOPSY;  Surgeon: Chevis Pretty III, MD;  Location:  SURGERY CENTER;  Service: General;  Laterality: Right;   SMALL INTESTINE SURGERY     TUBAL LIGATION     UPPER GASTROINTESTINAL ENDOSCOPY      SOCIAL HISTORY: Social History   Socioeconomic History   Marital status: Divorced    Spouse name: Not on file   Number of children: 7   Years of education: Not on file   Highest education level: Not on file  Occupational History   Occupation: retired  Tobacco Use   Smoking status: Former    Packs/day: 1.00    Years: 20.00    Total pack years: 20.00    Types: Cigarettes    Quit date: 07/30/2011    Years since quitting: 11.0   Smokeless tobacco: Never   Tobacco comments:    Quit >4 years ago; 1 ppd for about 5/20 years  (remaining was less)  Vaping Use   Vaping Use: Former  Substance and Sexual Activity   Alcohol use: No    Alcohol/week: 0.0 standard drinks of alcohol   Drug use: No   Sexual activity: Not Currently  Other Topics Concern   Not on file  Social History Narrative   Lives alone.  Retired.  Education:  11th grade GED.  Children:  7 (one here).    Social Determinants of Health   Financial Resource Strain: High Risk (12/21/2021)   Overall Financial Resource Strain (CARDIA)    Difficulty of Paying Living Expenses: Hard  Food Insecurity: No Food Insecurity (08/02/2022)   Hunger Vital Sign    Worried About Running Out of Food in the Last Year: Never true    Ran Out of Food in the Last Year: Never true  Transportation Needs: No Transportation Needs (08/02/2022)   PRAPARE - Administrator, Civil Service (Medical): No    Lack of Transportation (Non-Medical): No  Physical Activity: Inactive (12/20/2021)   Exercise Vital Sign    Days of Exercise per Week: 0 days    Minutes of Exercise per Session: 0 min  Stress: No Stress Concern Present (12/20/2021)   [employer] of Occupational Health - Occupational Stress Questionnaire    Feeling of Stress : Not at all  Social Connections: Moderately Integrated (12/20/2021)   Social Connection and Isolation Panel [NHANES]    Frequency of Communication with Friends and Family: More than three times a week    Frequency of Social Gatherings with Friends and Family: More than three times a week    Attends Religious Services: More than 4 times per year    Active Member of [employer] or Organizations: Yes    Attends Engineer, structural: More than 4 times per year    Marital Status: Divorced  Intimate Partner Violence: Not At Risk (12/20/2021)   Humiliation, Afraid, Rape, and Kick questionnaire    Fear of Current or Ex-Partner: No    Emotionally Abused: No    Physically Abused: No    Sexually Abused: No    FAMILY HISTORY: Family History   Problem Relation Age of Onset   Emphysema Mother 19       smoker   Diabetes Father    Lung cancer Sister        dx. <50; former smoker   Diabetes Brother    Diabetes Brother    Brain cancer Brother 40       unknown tumor type   Diabetes Paternal Aunt  Stroke Maternal Grandmother    Diabetes Paternal Grandmother    Cancer Daughter 45       neck cancer   Other Daughter        hysterectomy for unspecified reason   Colon cancer Daughter    Breast cancer Cousin    Cancer Cousin        unspecified type   Breast cancer Other        triple negative breast cancer in her 72s   Colon polyps Neg Hx    Esophageal cancer Neg Hx    Gallbladder disease Neg Hx    ROS  10 Point review of Systems was done is negative except as noted above.   PHYSICAL EXAMINATION  ECOG PERFORMANCE STATUS: 1 - Symptomatic but completely ambulatory  Vitals:   10/11/22 1132  BP: 138/71  Pulse: 85  Resp: 18  Temp: (!) 97.3 F (36.3 C)  SpO2: 97%   GENERAL:alert, in no acute distress and comfortable SKIN: no acute rashes, no significant lesions EYES: conjunctiva are pink and non-injected, sclera anicteric OROPHARYNX: MMM, no exudates, no oropharyngeal erythema or ulceration NECK: supple, no JVD LYMPH:  no palpable lymphadenopathy in the cervical, axillary or inguinal regions LUNGS: clear to auscultation b/l with normal respiratory effort HEART: regular rate & rhythm ABDOMEN:  normoactive bowel sounds , non tender, not distended. Extremity: no pedal edema PSYCH: alert & oriented x 3 with fluent speech NEURO: no focal motor/sensory deficits   LABORATORY DATA: .    Latest Ref Rng & Units 10/11/2022   10:57 AM 09/13/2022   11:35 AM 08/16/2022   10:07 AM  CBC  WBC 4.0 - 10.5 K/uL 5.0  5.2  5.0   Hemoglobin 12.0 - 15.0 g/dL 16.1  09.6  04.5   Hematocrit 36.0 - 46.0 % 30.9  29.7  29.7   Platelets 150 - 400 K/uL 256  288  282    .    Latest Ref Rng & Units 10/11/2022   10:57 AM 09/13/2022    11:35 AM 08/16/2022   10:07 AM  CMP  Glucose 70 - 99 mg/dL 409  811  914   BUN 8 - 23 mg/dL 14  15  16    Creatinine 0.44 - 1.00 mg/dL 7.82  9.56  2.13   Sodium 135 - 145 mmol/L 142  141  140   Potassium 3.5 - 5.1 mmol/L 4.1  3.9  4.1   Chloride 98 - 111 mmol/L 107  107  104   CO2 22 - 32 mmol/L 25  24  26    Calcium 8.9 - 10.3 mg/dL 9.3  8.8  9.4   Total Protein 6.5 - 8.1 g/dL 6.7  6.4  6.2   Total Bilirubin 0.3 - 1.2 mg/dL 0.3  0.3  0.2   Alkaline Phos 38 - 126 U/L 71  71  71   AST 15 - 41 U/L 14  14  13    ALT 0 - 44 U/L 11  12  10      Mammogram 09/23/2022:   ASSESSMENT and THERAPY PLAN:   77 yo here for follow-up of her for follow-up of her multiple myeloma   1)  IgG Kappa Multiple myeloma with bone lesions, anemia, renal insuff. M spike @ 3.7g/dl on diagnosis. 1p deletion, polymorphic variant, 13q deletion Multiple myeloma panel from 06/28/2021 with no M spike.  IFE positive for IgG kappa monoclonal protein possibly from her daratumumab. Currently on monthly daratumumab 2) h/o DM2 3) Diabetic Neuropathy 4)  CKD - likely from DM2, but could have an element of myeloma kidney. 5) h/o TIA and AMI 6) Iron deficiency 7) B12 deficiency   PLAN:  -Discussed lab results on 12/06/2022 in detail with patient. CBC stable, showed WBC of 4.5K, hemoglobin of 10.4, and platelets of 296K. -CMP stable -11/08/2022 multiple myeloma panel shows non observable M protein, last labs in April 2024 show that patient is in remission  -informed patient that by staying more active, her neuropathy may be less bothersome  -informed patient that she may be experiencing muscular skeletal pain with walking -answered all of patient's questions in detail -recommend patient to stay active to manage bone pains or neuropathy by engaging in activities such as water aerobics -no notably toxicities from current treatment. -Continue monthly Daratumumab treatment for continued maintenance -Pamidronate every 8  weeks -continue monthly B12  -Continue taking weekly vitamin D supplements to strengthen immune system and improve bone health  Follow-up: Per integrated scheduling  The total time spent in the appointment was 30 minutes* .  All of the patient's questions were answered with apparent satisfaction. The patient knows to call the clinic with any problems, questions or concerns.   Wyvonnia Lora MD MS AAHIVMS [facility] Hematology/Oncology Physician [facility]  .*Total Encounter Time as defined by the Centers for Medicare and Medicaid Services includes, in addition to the face-to-face time of a patient visit (documented in the note above) non-face-to-face time: obtaining and reviewing outside history, ordering and reviewing medications, tests or procedures, care coordination (communications with other health care professionals or caregivers) and documentation in the medical record.    I,Mitra Faeizi,acting as a Neurosurgeon for Wyvonnia Lora, MD.,have documented all relevant documentation on the behalf of Wyvonnia Lora, MD,as directed by  Wyvonnia Lora, MD while in the presence of Wyvonnia Lora, MD.  .I have reviewed the above documentation for accuracy and completeness, and I agree with the above. Johney Maine MD

## 2022-12-06 NOTE — Patient Instructions (Signed)
[facility]  Discharge Instructions: Thank you for choosing [facility] to provide your oncology and hematology care.   If you have a lab appointment with the Cancer Center, please go directly to the Cancer Center and check in at the registration area.   Wear comfortable clothing and clothing appropriate for easy access to any Portacath or PICC line.   We strive to give you quality time with your provider. You may need to reschedule your appointment if you arrive late (15 or more minutes).  Arriving late affects you and other patients whose appointments are after yours.  Also, if you miss three or more appointments without notifying the office, you may be dismissed from the clinic at the provider's discretion.      For prescription refill requests, have your pharmacy contact our office and allow 72 hours for refills to be completed.    Today you received the following chemotherapy and/or immunotherapy agents: daratumumab      To help prevent nausea and vomiting after your treatment, we encourage you to take your nausea medication as directed.  BELOW ARE SYMPTOMS THAT SHOULD BE REPORTED IMMEDIATELY: *FEVER GREATER THAN 100.4 F (38 C) OR HIGHER *CHILLS OR SWEATING *NAUSEA AND VOMITING THAT IS NOT CONTROLLED WITH YOUR NAUSEA MEDICATION *UNUSUAL SHORTNESS OF BREATH *UNUSUAL BRUISING OR BLEEDING *URINARY PROBLEMS (pain or burning when urinating, or frequent urination) *BOWEL PROBLEMS (unusual diarrhea, constipation, pain near the anus) TENDERNESS IN MOUTH AND THROAT WITH OR WITHOUT PRESENCE OF ULCERS (sore throat, sores in mouth, or a toothache) UNUSUAL RASH, SWELLING OR PAIN  UNUSUAL VAGINAL DISCHARGE OR ITCHING   Items with * indicate a potential emergency and should be followed up as soon as possible or go to the Emergency Department if any problems should occur.  Please show the CHEMOTHERAPY ALERT CARD or IMMUNOTHERAPY ALERT CARD at  check-in to the Emergency Department and triage nurse.  Should you have questions after your visit or need to cancel or reschedule your appointment, please contact [facility]  Dept: [phone]  and follow the prompts.  Office hours are 8:00 a.m. to 4:30 p.m. Monday - Friday. Please note that voicemails left after 4:00 p.m. may not be returned until the following business day.  We are closed weekends and major holidays. You have access to a nurse at all times for urgent questions. Please call the main number to the clinic Dept: [phone] and follow the prompts.   For any non-urgent questions, you may also contact your provider using MyChart. We now offer e-Visits for anyone 18 and older to request care online for non-urgent symptoms. For details visit [email or web address].   Also download the MyChart app! Go to the app store, search "MyChart", open the app, select [facility], and log in with your MyChart username and password.  

## 2022-12-10 ENCOUNTER — Other Ambulatory Visit: Payer: Self-pay

## 2022-12-13 ENCOUNTER — Encounter: Payer: Self-pay | Admitting: Hematology

## 2022-12-15 ENCOUNTER — Other Ambulatory Visit: Payer: Self-pay

## 2022-12-19 LAB — MULTIPLE MYELOMA PANEL, SERUM
Albumin SerPl Elph-Mcnc: 3.6 g/dL (ref 2.9–4.4)
Albumin/Glob SerPl: 1.4 (ref 0.7–1.7)
Alpha 1: 0.2 g/dL (ref 0.0–0.4)
Alpha2 Glob SerPl Elph-Mcnc: 1.2 g/dL — ABNORMAL HIGH (ref 0.4–1.0)
B-Globulin SerPl Elph-Mcnc: 0.9 g/dL (ref 0.7–1.3)
Gamma Glob SerPl Elph-Mcnc: 0.4 g/dL (ref 0.4–1.8)
Globulin, Total: 2.7 g/dL (ref 2.2–3.9)
IgA: 71 mg/dL (ref 64–422)
IgG (Immunoglobin G), Serum: 430 mg/dL — ABNORMAL LOW (ref 586–1602)
IgM (Immunoglobulin M), Srm: 40 mg/dL (ref 26–217)
M Protein SerPl Elph-Mcnc: 0.1 g/dL — ABNORMAL HIGH
Total Protein ELP: 6.3 g/dL (ref 6.0–8.5)

## 2022-12-22 ENCOUNTER — Other Ambulatory Visit: Payer: Self-pay

## 2022-12-26 ENCOUNTER — Ambulatory Visit (INDEPENDENT_AMBULATORY_CARE_PROVIDER_SITE_OTHER): Payer: Medicare Other | Admitting: *Deleted

## 2022-12-26 DIAGNOSIS — Z0000 Encounter for general adult medical examination without abnormal findings: Secondary | ICD-10-CM

## 2022-12-26 NOTE — Patient Instructions (Signed)
Tracy [name] , Thank you for taking time to come for your Medicare Wellness Visit. I appreciate your ongoing commitment to your health goals. Please review the following plan we discussed and let me know if I can assist you in the future.   Screening recommendations/referrals: Colonoscopy: no longer required Mammogram: up to date Bone Density: up to date Recommended yearly ophthalmology/optometry visit for glaucoma screening and checkup Recommended yearly dental visit for hygiene and checkup  Vaccinations: Influenza vaccine: up to date Pneumococcal vaccine: up to date Tdap vaccine: up to date Shingles vaccine: Education provided    Advanced directives: Education provided     Preventive Care 65 Years and Older, Female Preventive care refers to lifestyle choices and visits with your health care provider that can promote health and wellness. What does preventive care include? A yearly physical exam. This is also called an annual well check. Dental exams once or twice a year. Routine eye exams. Ask your health care provider how often you should have your eyes checked. Personal lifestyle choices, including: Daily care of your teeth and gums. Regular physical activity. Eating a healthy diet. Avoiding tobacco and drug use. Limiting alcohol use. Practicing safe sex. Taking low-dose aspirin every day. Taking vitamin and mineral supplements as recommended by your health care provider. What happens during an annual well check? The services and screenings done by your health care provider during your annual well check will depend on your age, overall health, lifestyle risk factors, and family history of disease. Counseling  Your health care provider may ask you questions about your: Alcohol use. Tobacco use. Drug use. Emotional well-being. Home and relationship well-being. Sexual activity. Eating habits. History of falls. Memory and ability to understand (cognition). Work and work  Astronomer. Reproductive health. Screening  You may have the following tests or measurements: Height, weight, and BMI. Blood pressure. Lipid and cholesterol levels. These may be checked every 5 years, or more frequently if you are over 63 years old. Skin check. Lung cancer screening. You may have this screening every year starting at age 85 if you have a 30-pack-year history of smoking and currently smoke or have quit within the past 15 years. Fecal occult blood test (FOBT) of the stool. You may have this test every year starting at age 47. Flexible sigmoidoscopy or colonoscopy. You may have a sigmoidoscopy every 5 years or a colonoscopy every 10 years starting at age 62. Hepatitis C blood test. Hepatitis B blood test. Sexually transmitted disease (STD) testing. Diabetes screening. This is done by checking your blood sugar (glucose) after you have not eaten for a while (fasting). You may have this done every 1-3 years. Bone density scan. This is done to screen for osteoporosis. You may have this done starting at age 34. Mammogram. This may be done every 1-2 years. Talk to your health care provider about how often you should have regular mammograms. Talk with your health care provider about your test results, treatment options, and if necessary, the need for more tests. Vaccines  Your health care provider may recommend certain vaccines, such as: Influenza vaccine. This is recommended every year. Tetanus, diphtheria, and acellular pertussis (Tdap, Td) vaccine. You may need a Td booster every 10 years. Zoster vaccine. You may need this after age 1. Pneumococcal 13-valent conjugate (PCV13) vaccine. One dose is recommended after age 71. Pneumococcal polysaccharide (PPSV23) vaccine. One dose is recommended after age 32. Talk to your health care provider about which screenings and vaccines you need and how  often you need them. This information is not intended to replace advice given to you by  your health care provider. Make sure you discuss any questions you have with your health care provider. Document Released: 08/11/2015 Document Revised: 04/03/2016 Document Reviewed: 05/16/2015 Elsevier Interactive Patient Education  2017 [employer].  Fall Prevention in the Home Falls can cause injuries. They can happen to people of all ages. There are many things you can do to make your home safe and to help prevent falls. What can I do on the outside of my home? Regularly fix the edges of walkways and driveways and fix any cracks. Remove anything that might make you trip as you walk through a door, such as a raised step or threshold. Trim any bushes or trees on the path to your home. Use bright outdoor lighting. Clear any walking paths of anything that might make someone trip, such as rocks or tools. Regularly check to see if handrails are loose or broken. Make sure that both sides of any steps have handrails. Any raised decks and porches should have guardrails on the edges. Have any leaves, snow, or ice cleared regularly. Use sand or salt on walking paths during winter. Clean up any spills in your garage right away. This includes oil or grease spills. What can I do in the bathroom? Use night lights. Install grab bars by the toilet and in the tub and shower. Do not use towel bars as grab bars. Use non-skid mats or decals in the tub or shower. If you need to sit down in the shower, use a plastic, non-slip stool. Keep the floor dry. Clean up any water that spills on the floor as soon as it happens. Remove soap buildup in the tub or shower regularly. Attach bath mats securely with double-sided non-slip rug tape. Do not have throw rugs and other things on the floor that can make you trip. What can I do in the bedroom? Use night lights. Make sure that you have a light by your bed that is easy to reach. Do not use any sheets or blankets that are too big for your bed. They should not hang  down onto the floor. Have a firm chair that has side arms. You can use this for support while you get dressed. Do not have throw rugs and other things on the floor that can make you trip. What can I do in the kitchen? Clean up any spills right away. Avoid walking on wet floors. Keep items that you use a lot in easy-to-reach places. If you need to reach something above you, use a strong step stool that has a grab bar. Keep electrical cords out of the way. Do not use floor polish or wax that makes floors slippery. If you must use wax, use non-skid floor wax. Do not have throw rugs and other things on the floor that can make you trip. What can I do with my stairs? Do not leave any items on the stairs. Make sure that there are handrails on both sides of the stairs and use them. Fix handrails that are broken or loose. Make sure that handrails are as long as the stairways. Check any carpeting to make sure that it is firmly attached to the stairs. Fix any carpet that is loose or worn. Avoid having throw rugs at the top or bottom of the stairs. If you do have throw rugs, attach them to the floor with carpet tape. Make sure that you have a  light switch at the top of the stairs and the bottom of the stairs. If you do not have them, ask someone to add them for you. What else can I do to help prevent falls? Wear shoes that: Do not have high heels. Have rubber bottoms. Are comfortable and fit you well. Are closed at the toe. Do not wear sandals. If you use a stepladder: Make sure that it is fully opened. Do not climb a closed stepladder. Make sure that both sides of the stepladder are locked into place. Ask someone to hold it for you, if possible. Clearly mark and make sure that you can see: Any grab bars or handrails. First and last steps. Where the edge of each step is. Use tools that help you move around (mobility aids) if they are needed. These  include: Canes. Walkers. Scooters. Crutches. Turn on the lights when you go into a dark area. Replace any light bulbs as soon as they burn out. Set up your furniture so you have a clear path. Avoid moving your furniture around. If any of your floors are uneven, fix them. If there are any pets around you, be aware of where they are. Review your medicines with your doctor. Some medicines can make you feel dizzy. This can increase your chance of falling. Ask your doctor what other things that you can do to help prevent falls. This information is not intended to replace advice given to you by your health care provider. Make sure you discuss any questions you have with your health care provider. Document Released: 05/11/2009 Document Revised: 12/21/2015 Document Reviewed: 08/19/2014 Elsevier Interactive Patient Education  2017 [employer].

## 2022-12-26 NOTE — Progress Notes (Signed)
Subjective:   Tracy [name] is a 77 y.o. female who presents for Medicare Annual (Subsequent) preventive examination.  I connected with  [name] on 12/26/22 by a telephone enabled telemedicine application and verified that I am speaking with the correct person using two identifiers.   I discussed the limitations of evaluation and management by telemedicine. The patient expressed understanding and agreed to proceed.  Patient location: home  Provider location: telephone/home    Review of Systems     Cardiac Risk Factors include: advanced age (>36men, >5 women);hypertension;smoking/ tobacco exposure;sedentary lifestyle;obesity (BMI >30kg/m2)     Objective:    Today's Vitals   12/26/22 1335  PainSc: 4    There is no height or weight on file to calculate BMI.     12/26/2022    1:40 PM 10/11/2022   11:38 AM 12/20/2021    2:46 PM 10/12/2021    1:48 PM 05/31/2021   10:22 AM 04/30/2021    3:45 PM 03/16/2021    9:15 PM  Advanced Directives  Does Patient Have a Medical Advance Directive? No Yes Yes Yes Yes No Yes  Type of Special educational needs teacher of [place];Living will Living will Healthcare Power of [place];Living will   Living will  Does patient want to make changes to medical advance directive?    No - Patient declined No - Patient declined  No - Patient declined  Copy of Healthcare Power of Attorney in Chart?  No - copy requested  No - copy requested   No - copy requested  Would patient like information on creating a medical advance directive? No - Patient declined No - Patient declined  No - Patient declined  No - Patient declined     Current Medications (verified) Outpatient Encounter Medications as of 12/26/2022  Medication Sig   acyclovir (ZOVIRAX) 400 MG tablet TAKE 1 TABLET BY MOUTH TWICE  DAILY   atorvastatin (LIPITOR) 10 MG tablet TAKE 1 TABLET BY MOUTH DAILY   azelastine (ASTELIN) 0.1 % nasal spray Place 1 spray into both nostrils 2 (two) times  daily. Use in each nostril as directed   gabapentin (NEURONTIN) 300 MG capsule Take 2 capsules (600 mg total) by mouth 3 (three) times daily. (Patient taking differently: Take 300 mg by mouth 3 (three) times daily.)   glucose blood (ACCU-CHEK GUIDE) test strip Use as instructed to check sugars 1-2 times daily.   hydrocortisone 2.5 % cream Apply topically 2 (two) times daily.   lidocaine-prilocaine (EMLA) cream Apply 1 Application topically as needed.   loratadine (CLARITIN) 10 MG tablet Take 10 mg by mouth daily.   meclizine (ANTIVERT) 25 MG tablet Take 1 tablet (25 mg total) by mouth 3 (three) times daily as needed for dizziness.   metFORMIN (GLUCOPHAGE) 500 MG tablet TAKE 1 TABLET BY MOUTH  TWICE DAILY WITH A MEAL   oxymetazoline (AFRIN) 0.05 % nasal spray Place 1 spray into both nostrils 2 (two) times daily as needed for congestion. Or bloody nose.   pantoprazole (PROTONIX) 40 MG tablet TAKE 1 TABLET BY MOUTH TWICE  DAILY   tiZANidine (ZANAFLEX) 4 MG tablet TAKE 1 TABLET BY MOUTH AT  BEDTIME   traZODone (DESYREL) 100 MG tablet TAKE 1 TABLET BY MOUTH AT  BEDTIME   Vitamin D, Ergocalciferol, (DRISDOL) 1.25 MG (50000 UNIT) CAPS capsule TAKE 1 CAPSULE BY MOUTH ONCE  WEEKLY   Facility-Administered Encounter Medications as of 12/26/2022  Medication   0.9 %  sodium chloride infusion  Allergies (verified) Bacitracin-neomycin-polymyxin  [neomycin-bacitracin zn-polymyx], Nsaids, Tape, Ambien [zolpidem tartrate], Amoxicillin, Clavulanic acid, Contrast media [iodinated contrast media], Latex, Prednisone, and Tessalon [benzonatate]   History: Past Medical History:  Diagnosis Date   Angiodysplasia of intestine 08/23/2020   Anxiety    Arthritis    Breast cancer (HCC) 06/13/15   Cancer (HCC) 2000   breast cancer   Chronic bronchitis (HCC)    Chronic bronchitis (HCC)    Hyperlipidemia    Hypertension    Myocardial infarction [facility]) 2001   Personal history of radiation therapy    Restless leg     Stroke (HCC) 2004   TIA, no deficits   Past Surgical History:  Procedure Laterality Date   ABDOMINAL HYSTERECTOMY  1985   BREAST LUMPECTOMY Left 2000   radiation and chemo   BREAST LUMPECTOMY Right 2016   radiation   BREAST SURGERY  2001   lt breast lumpectomy   COLONOSCOPY WITH ESOPHAGOGASTRODUODENOSCOPY (EGD)  04/2020   ENTEROSCOPY N/A 03/15/2021   Procedure: ENTEROSCOPY;  Surgeon: Rachael Fee, MD;  Location: [facility];  Service: Endoscopy;  Laterality: N/A;   GIVENS CAPSULE STUDY  07/2020   HOT HEMOSTASIS N/A 03/15/2021   Procedure: HOT HEMOSTASIS (ARGON PLASMA COAGULATION/BICAP);  Surgeon: Rachael Fee, MD;  Location: [facility];  Service: Endoscopy;  Laterality: N/A;   IR IMAGING GUIDED PORT INSERTION  04/25/2020   RADIOACTIVE SEED GUIDED PARTIAL MASTECTOMY WITH AXILLARY SENTINEL LYMPH NODE BIOPSY Right 07/07/2015   Procedure: RIGHT RADIOACTIVE SEED GUIDED PARTIAL MASTECTOMY WITH AXILLARY SENTINEL LYMPH NODE BIOPSY;  Surgeon: Chevis Pretty III, MD;  Location: [facility];  Service: General;  Laterality: Right;   SMALL INTESTINE SURGERY     TUBAL LIGATION     UPPER GASTROINTESTINAL ENDOSCOPY     Family History  Problem Relation Age of Onset   Emphysema Mother 67       smoker   Diabetes Father    Lung cancer Sister        dx. <50; former smoker   Diabetes Brother    Diabetes Brother    Brain cancer Brother 22       unknown tumor type   Diabetes Paternal Aunt    Stroke Maternal Grandmother    Diabetes Paternal Grandmother    Cancer Daughter 45       neck cancer   Other Daughter        hysterectomy for unspecified reason   Colon cancer Daughter    Breast cancer Cousin    Cancer Cousin        unspecified type   Breast cancer Other        triple negative breast cancer in her 31s   Colon polyps Neg Hx    Esophageal cancer Neg Hx    Gallbladder disease Neg Hx    Social History   Socioeconomic History   Marital status: Divorced    Spouse  name: Not on file   Number of children: 7   Years of education: Not on file   Highest education level: Not on file  Occupational History   Occupation: retired  Tobacco Use   Smoking status: Former    Packs/day: 1.00    Years: 20.00    Additional pack years: 0.00    Total pack years: 20.00    Types: Cigarettes    Quit date: 07/30/2011    Years since quitting: 11.4   Smokeless tobacco: Never   Tobacco comments:    Quit >4 years  ago; 1 ppd for about 5/20 years (remaining was less)  Vaping Use   Vaping Use: Former  Substance and Sexual Activity   Alcohol use: No    Alcohol/week: 0.0 standard drinks of alcohol   Drug use: No   Sexual activity: Not Currently  Other Topics Concern   Not on file  Social History Narrative   Lives alone.  Retired.  Education:  11th grade GED.  Children:  7 (one here).    Social Determinants of Health   Financial Resource Strain: High Risk (12/26/2022)   Overall Financial Resource Strain (CARDIA)    Difficulty of Paying Living Expenses: Hard  Food Insecurity: No Food Insecurity (12/26/2022)   Hunger Vital Sign    Worried About Running Out of Food in the Last Year: Never true    Ran Out of Food in the Last Year: Never true  Transportation Needs: No Transportation Needs (12/26/2022)   PRAPARE - Administrator, Civil Service (Medical): No    Lack of Transportation (Non-Medical): No  Physical Activity: Inactive (12/26/2022)   Exercise Vital Sign    Days of Exercise per Week: 0 days    Minutes of Exercise per Session: 0 min  Stress: Stress Concern Present (12/26/2022)   [employer] of Occupational Health - Occupational Stress Questionnaire    Feeling of Stress : To some extent  Social Connections: Moderately Isolated (12/26/2022)   Social Connection and Isolation Panel [NHANES]    Frequency of Communication with Friends and Family: More than three times a week    Frequency of Social Gatherings with Friends and Family: Once a week     Attends Religious Services: More than 4 times per year    Active Member of [employer] or Organizations: No    Attends Engineer, structural: Never    Marital Status: Divorced    Tobacco Counseling Counseling given: Not Answered Tobacco comments: Quit >4 years ago; 1 ppd for about 5/20 years (remaining was less)   Clinical Intake:  Pre-visit preparation completed: Yes  Pain : 0-10 Pain Score: 4  Pain Type: Acute pain Pain Location: Groin Pain Orientation: Right Pain Descriptors / Indicators: Burning, Constant, Aching Pain Onset: In the past 7 days Pain Frequency: Intermittent     Diabetes: No  How often do you need to have someone help you when you read instructions, pamphlets, or other written materials from your doctor or pharmacy?: 1 - Never  Diabetic?  no  Interpreter Needed?: No  Information entered by :: Remi Haggard LPN   Activities of Daily Living    12/26/2022    1:40 PM  In your present state of health, do you have any difficulty performing the following activities:  Hearing? 0  Vision? 0  Difficulty concentrating or making decisions? 0  Walking or climbing stairs? 0  Dressing or bathing? 0  Doing errands, shopping? 0  Preparing Food and eating ? N  Using the Toilet? N  In the past six months, have you accidently leaked urine? Y  Do you have problems with loss of bowel control? N  Managing your Medications? N  Managing your Finances? N  Housekeeping or managing your Housekeeping? N    Patient Care Team: [name], MD as PCP - General (Family Medicine) Claria Dice, MD as Attending Physician (Physical Medicine and Rehabilitation) Marcene Corning, MD as Consulting Physician (Orthopedic Surgery) Christia Reading, MD as Consulting Physician (Otolaryngology) Sharrell Ku, MD as Consulting Physician (Gastroenterology) Griselda Miner, MD as  Consulting Physician (General Surgery) Erroll Luna, [facility] (Pharmacist)  Indicate any recent  Medical Services you may have received from other than [facility] providers in the past year (date may be approximate).     Assessment:   This is a routine wellness examination for [facility].  Hearing/Vision screen Hearing Screening - Comments:: No trouble hearing Vision Screening - Comments:: Not up to date [facility]  Dietary issues and exercise activities discussed: Current Exercise Habits: The patient does not participate in regular exercise at present   Goals Addressed             This Visit's Progress    Patient Stated       No goals       Depression Screen    12/26/2022    1:45 PM 08/02/2022   11:46 AM 06/12/2022    1:24 PM 12/20/2021    2:42 PM 09/24/2021    9:55 AM 09/03/2021   10:02 AM 08/22/2021   11:49 AM  PHQ 2/9 Scores  PHQ - 2 Score 2 0 1 0 0 0 0  PHQ- 9 Score 6  7  3 3 4     Fall Risk    12/26/2022    1:39 PM 06/12/2022    1:24 PM 12/20/2021    2:47 PM 11/07/2021    3:24 PM 09/24/2021    9:55 AM  Fall Risk   Falls in the past year? 0 1 0 0 0  Number falls in past yr: 0 0 0 0   Injury with Fall? 0 1 0 0   Risk for fall due to :  History of fall(s) Impaired balance/gait;Medication side effect No Fall Risks No Fall Risks  Follow up Falls evaluation completed;Education provided;Falls prevention discussed Falls evaluation completed Falls prevention discussed Falls evaluation completed Falls evaluation completed    FALL RISK PREVENTION PERTAINING TO THE HOME:  Any stairs in or around the home? No  If so, are there any without handrails? No  Home free of loose throw rugs in walkways, pet beds, electrical cords, etc? Yes  Adequate lighting in your home to reduce risk of falls? Yes   ASSISTIVE DEVICES UTILIZED TO PREVENT FALLS:  Life alert? No  Use of a cane, walker or w/c? No  Grab bars in the bathroom? No  Shower chair or bench in shower? Yes  Elevated toilet seat or a handicapped toilet? No   TIMED UP AND GO:  Was the test performed? No .    Cognitive  Function:        12/26/2022    1:41 PM 12/20/2021    2:51 PM  6CIT Screen  What Year? 0 points 0 points  What month? 0 points 0 points  What time? 0 points 0 points  Count back from 20 0 points 0 points  Months in reverse 0 points 0 points  Repeat phrase 0 points 0 points  Total Score 0 points 0 points    Immunizations Immunization History  Administered Date(s) Administered   Fluad Quad(high Dose 65+) 05/31/2020   Influenza Split 04/28/2010, 06/24/2011   Influenza Whole 04/22/2013   Influenza, High Dose Seasonal PF 05/30/2016, 05/20/2018, 04/09/2019   Influenza,inj,Quad PF,6+ Mos 04/29/2015, 05/16/2017   Influenza-Unspecified 05/03/2014, 05/07/2022   Moderna Sars-Covid-2 Vaccination 09/27/2019, 10/25/2019, 06/05/2020   Pneumococcal Conjugate-13 06/01/2015   Pneumococcal Polysaccharide-23 05/10/2014   Td 04/05/2013   Zoster, Live 05/01/2010    TDAP status: Up to date  Flu Vaccine status: Up to date  Pneumococcal vaccine status:  Up to date  Covid-19 vaccine status: Information provided on how to obtain vaccines.   Qualifies for Shingles Vaccine? Yes   Zostavax completed No   Shingrix Completed?: No.    Education has been provided regarding the importance of this vaccine. Patient has been advised to call insurance company to determine out of pocket expense if they have not yet received this vaccine. Advised may also receive vaccine at local pharmacy or Health Dept. Verbalized acceptance and understanding.  Screening Tests Health Maintenance  Topic Date Due   Lung Cancer Screening  09/12/2016   OPHTHALMOLOGY EXAM  05/19/2020   FOOT EXAM  09/24/2022   HEMOGLOBIN A1C  12/11/2022   INFLUENZA VACCINE  02/27/2023   DTaP/Tdap/Td (2 - Tdap) 04/06/2023   Diabetic kidney evaluation - Urine ACR  06/13/2023   Diabetic kidney evaluation - eGFR measurement  12/06/2023   Medicare Annual Wellness (AWV)  12/26/2023   Pneumonia Vaccine 26+ Years old  Completed   DEXA SCAN   Completed   Hepatitis C Screening  Completed   HPV VACCINES  Aged Out   Colonoscopy  Discontinued   COVID-19 Vaccine  Discontinued   Zoster Vaccines- Shingrix  Discontinued    Health Maintenance  Health Maintenance Due  Topic Date Due   Lung Cancer Screening  09/12/2016   OPHTHALMOLOGY EXAM  05/19/2020   FOOT EXAM  09/24/2022   HEMOGLOBIN A1C  12/11/2022    Colorectal cancer screening: No longer required.   Mammogram status: Completed  . Repeat every year  Bone Density status: Completed 2022. Results reflect: Bone density results: NORMAL. Repeat every 5 years.  Lung Cancer Screening: (Low Dose CT Chest recommended if Age 96-80 years, 30 pack-year currently smoking OR have quit w/in 15years.) does qualify.   Lung Cancer Screening Referral:  decline  Additional Screening:  Hepatitis C Screening: does not qualify; Completed 2021  Vision Screening: Recommended annual ophthalmology exams for early detection of glaucoma and other disorders of the eye. Is the patient up to date with their annual eye exam?  Yes  Who is the provider or what is the name of the office in which the patient attends annual eye exams? [facility] If pt is not established with a provider, would they like to be referred to a provider to establish care? No .   Dental Screening: Recommended annual dental exams for proper oral hygiene  Community Resource Referral / Chronic Care Management: CRR required this visit?  No   CCM required this visit?  No      Plan:     I have personally reviewed and noted the following in the patient's chart:   Medical and social history Use of alcohol, tobacco or illicit drugs  Current medications and supplements including opioid prescriptions. Patient is not currently taking opioid prescriptions. Functional ability and status Nutritional status Physical activity Advanced directives List of other physicians Hospitalizations, surgeries, and ER visits in previous 12  months Vitals Screenings to include cognitive, depression, and falls Referrals and appointments  In addition, I have reviewed and discussed with patient certain preventive protocols, quality metrics, and best practice recommendations. A written personalized care plan for preventive services as well as general preventive health recommendations were provided to patient.     Remi Haggard, LPN   9/56/2130   Nurse Notes:

## 2023-01-01 ENCOUNTER — Other Ambulatory Visit: Payer: Self-pay

## 2023-01-03 ENCOUNTER — Other Ambulatory Visit: Payer: Self-pay

## 2023-01-03 ENCOUNTER — Inpatient Hospital Stay: Payer: Medicare Other | Attending: Hematology

## 2023-01-03 ENCOUNTER — Inpatient Hospital Stay: Payer: Medicare Other

## 2023-01-03 VITALS — BP 144/73 | HR 82 | Temp 98.1°F | Resp 16 | Wt 189.0 lb

## 2023-01-03 DIAGNOSIS — C7951 Secondary malignant neoplasm of bone: Secondary | ICD-10-CM

## 2023-01-03 DIAGNOSIS — N189 Chronic kidney disease, unspecified: Secondary | ICD-10-CM | POA: Insufficient documentation

## 2023-01-03 DIAGNOSIS — I252 Old myocardial infarction: Secondary | ICD-10-CM | POA: Diagnosis not present

## 2023-01-03 DIAGNOSIS — I2510 Atherosclerotic heart disease of native coronary artery without angina pectoris: Secondary | ICD-10-CM | POA: Insufficient documentation

## 2023-01-03 DIAGNOSIS — Z79899 Other long term (current) drug therapy: Secondary | ICD-10-CM | POA: Diagnosis not present

## 2023-01-03 DIAGNOSIS — I129 Hypertensive chronic kidney disease with stage 1 through stage 4 chronic kidney disease, or unspecified chronic kidney disease: Secondary | ICD-10-CM | POA: Insufficient documentation

## 2023-01-03 DIAGNOSIS — G629 Polyneuropathy, unspecified: Secondary | ICD-10-CM | POA: Diagnosis not present

## 2023-01-03 DIAGNOSIS — Z853 Personal history of malignant neoplasm of breast: Secondary | ICD-10-CM | POA: Insufficient documentation

## 2023-01-03 DIAGNOSIS — Z801 Family history of malignant neoplasm of trachea, bronchus and lung: Secondary | ICD-10-CM | POA: Diagnosis not present

## 2023-01-03 DIAGNOSIS — E1122 Type 2 diabetes mellitus with diabetic chronic kidney disease: Secondary | ICD-10-CM | POA: Insufficient documentation

## 2023-01-03 DIAGNOSIS — C9000 Multiple myeloma not having achieved remission: Secondary | ICD-10-CM

## 2023-01-03 DIAGNOSIS — Z95828 Presence of other vascular implants and grafts: Secondary | ICD-10-CM

## 2023-01-03 DIAGNOSIS — Z7189 Other specified counseling: Secondary | ICD-10-CM

## 2023-01-03 DIAGNOSIS — Z90710 Acquired absence of both cervix and uterus: Secondary | ICD-10-CM | POA: Diagnosis not present

## 2023-01-03 DIAGNOSIS — Z800 Family history of malignant neoplasm of digestive organs: Secondary | ICD-10-CM | POA: Diagnosis not present

## 2023-01-03 DIAGNOSIS — Z87891 Personal history of nicotine dependence: Secondary | ICD-10-CM | POA: Diagnosis not present

## 2023-01-03 DIAGNOSIS — Z923 Personal history of irradiation: Secondary | ICD-10-CM | POA: Insufficient documentation

## 2023-01-03 DIAGNOSIS — G4733 Obstructive sleep apnea (adult) (pediatric): Secondary | ICD-10-CM | POA: Insufficient documentation

## 2023-01-03 DIAGNOSIS — M545 Low back pain, unspecified: Secondary | ICD-10-CM | POA: Diagnosis not present

## 2023-01-03 DIAGNOSIS — Z8673 Personal history of transient ischemic attack (TIA), and cerebral infarction without residual deficits: Secondary | ICD-10-CM | POA: Diagnosis not present

## 2023-01-03 DIAGNOSIS — E538 Deficiency of other specified B group vitamins: Secondary | ICD-10-CM | POA: Insufficient documentation

## 2023-01-03 DIAGNOSIS — Z5112 Encounter for antineoplastic immunotherapy: Secondary | ICD-10-CM | POA: Diagnosis not present

## 2023-01-03 DIAGNOSIS — D509 Iron deficiency anemia, unspecified: Secondary | ICD-10-CM | POA: Diagnosis not present

## 2023-01-03 DIAGNOSIS — E1140 Type 2 diabetes mellitus with diabetic neuropathy, unspecified: Secondary | ICD-10-CM | POA: Diagnosis not present

## 2023-01-03 DIAGNOSIS — G2581 Restless legs syndrome: Secondary | ICD-10-CM | POA: Diagnosis not present

## 2023-01-03 DIAGNOSIS — Z803 Family history of malignant neoplasm of breast: Secondary | ICD-10-CM | POA: Insufficient documentation

## 2023-01-03 LAB — CBC WITH DIFFERENTIAL (CANCER CENTER ONLY)
Abs Immature Granulocytes: 0.01 10*3/uL (ref 0.00–0.07)
Basophils Absolute: 0 10*3/uL (ref 0.0–0.1)
Basophils Relative: 1 %
Eosinophils Absolute: 0.2 10*3/uL (ref 0.0–0.5)
Eosinophils Relative: 3 %
HCT: 30.2 % — ABNORMAL LOW (ref 36.0–46.0)
Hemoglobin: 10.2 g/dL — ABNORMAL LOW (ref 12.0–15.0)
Immature Granulocytes: 0 %
Lymphocytes Relative: 36 %
Lymphs Abs: 1.8 10*3/uL (ref 0.7–4.0)
MCH: 31.3 pg (ref 26.0–34.0)
MCHC: 33.8 g/dL (ref 30.0–36.0)
MCV: 92.6 fL (ref 80.0–100.0)
Monocytes Absolute: 0.4 10*3/uL (ref 0.1–1.0)
Monocytes Relative: 9 %
Neutro Abs: 2.5 10*3/uL (ref 1.7–7.7)
Neutrophils Relative %: 51 %
Platelet Count: 326 10*3/uL (ref 150–400)
RBC: 3.26 MIL/uL — ABNORMAL LOW (ref 3.87–5.11)
RDW: 13 % (ref 11.5–15.5)
WBC Count: 4.9 10*3/uL (ref 4.0–10.5)
nRBC: 0 % (ref 0.0–0.2)

## 2023-01-03 LAB — CMP (CANCER CENTER ONLY)
ALT: 11 U/L (ref 0–44)
AST: 16 U/L (ref 15–41)
Albumin: 4.1 g/dL (ref 3.5–5.0)
Alkaline Phosphatase: 68 U/L (ref 38–126)
Anion gap: 12 (ref 5–15)
BUN: 13 mg/dL (ref 8–23)
CO2: 25 mmol/L (ref 22–32)
Calcium: 9.4 mg/dL (ref 8.9–10.3)
Chloride: 108 mmol/L (ref 98–111)
Creatinine: 1.25 mg/dL — ABNORMAL HIGH (ref 0.44–1.00)
GFR, Estimated: 44 mL/min — ABNORMAL LOW (ref >60)
Glucose, Bld: 128 mg/dL — ABNORMAL HIGH (ref 70–99)
Potassium: 3.4 mmol/L — ABNORMAL LOW (ref 3.5–5.1)
Sodium: 145 mmol/L (ref 135–145)
Total Bilirubin: 0.3 mg/dL (ref 0.3–1.2)
Total Protein: 6.6 g/dL (ref 6.5–8.1)

## 2023-01-03 MED ORDER — SODIUM CHLORIDE 0.9% FLUSH
10.0000 mL | INTRAVENOUS | Status: DC | PRN
  Administered 2023-01-03: 10 mL

## 2023-01-03 MED ORDER — FAMOTIDINE IN NACL 20-0.9 MG/50ML-% IV SOLN
20.0000 mg | Freq: Once | INTRAVENOUS | Status: AC
  Administered 2023-01-03: 20 mg via INTRAVENOUS
  Filled 2023-01-03: qty 50

## 2023-01-03 MED ORDER — SODIUM CHLORIDE 0.9 % IV SOLN
16.0000 mg | Freq: Once | INTRAVENOUS | Status: AC
  Administered 2023-01-03: 16 mg via INTRAVENOUS
  Filled 2023-01-03: qty 1.6

## 2023-01-03 MED ORDER — SODIUM CHLORIDE 0.9 % IV SOLN: Freq: Once | INTRAVENOUS | Status: AC

## 2023-01-03 MED ORDER — SODIUM CHLORIDE 0.9 % IV SOLN
16.0000 mg/kg | Freq: Once | INTRAVENOUS | Status: AC
  Administered 2023-01-03: 1300 mg via INTRAVENOUS
  Filled 2023-01-03: qty 5

## 2023-01-03 MED ORDER — CYANOCOBALAMIN 1000 MCG/ML IJ SOLN
1000.0000 ug | Freq: Once | INTRAMUSCULAR | Status: AC
  Administered 2023-01-03: 1000 ug via INTRAMUSCULAR
  Filled 2023-01-03: qty 1

## 2023-01-03 MED ORDER — HEPARIN SOD (PORK) LOCK FLUSH 100 UNIT/ML IV SOLN
500.0000 [IU] | Freq: Once | INTRAVENOUS | Status: AC | PRN
  Administered 2023-01-03: 500 [IU]

## 2023-01-03 MED ORDER — SODIUM CHLORIDE 0.9% FLUSH
10.0000 mL | Freq: Once | INTRAVENOUS | Status: AC
  Administered 2023-01-03: 10 mL

## 2023-01-03 MED ORDER — ACETAMINOPHEN 325 MG PO TABS
650.0000 mg | ORAL_TABLET | Freq: Once | ORAL | Status: AC
  Administered 2023-01-03: 650 mg via ORAL
  Filled 2023-01-03: qty 2

## 2023-01-03 MED ORDER — DIPHENHYDRAMINE HCL 25 MG PO CAPS
50.0000 mg | ORAL_CAPSULE | Freq: Once | ORAL | Status: AC
  Administered 2023-01-03: 50 mg via ORAL
  Filled 2023-01-03: qty 2

## 2023-01-03 NOTE — Patient Instructions (Signed)
[facility]  Discharge Instructions: Thank you for choosing [facility] to provide your oncology and hematology care.   If you have a lab appointment with the Cancer Center, please go directly to the Cancer Center and check in at the registration area.   Wear comfortable clothing and clothing appropriate for easy access to any Portacath or PICC line.   We strive to give you quality time with your provider. You may need to reschedule your appointment if you arrive late (15 or more minutes).  Arriving late affects you and other patients whose appointments are after yours.  Also, if you miss three or more appointments without notifying the office, you may be dismissed from the clinic at the provider's discretion.      For prescription refill requests, have your pharmacy contact our office and allow 72 hours for refills to be completed.    Today you received the following chemotherapy and/or immunotherapy agents: Darzalex    To help prevent nausea and vomiting after your treatment, we encourage you to take your nausea medication as directed.  BELOW ARE SYMPTOMS THAT SHOULD BE REPORTED IMMEDIATELY: *FEVER GREATER THAN 100.4 F (38 C) OR HIGHER *CHILLS OR SWEATING *NAUSEA AND VOMITING THAT IS NOT CONTROLLED WITH YOUR NAUSEA MEDICATION *UNUSUAL SHORTNESS OF BREATH *UNUSUAL BRUISING OR BLEEDING *URINARY PROBLEMS (pain or burning when urinating, or frequent urination) *BOWEL PROBLEMS (unusual diarrhea, constipation, pain near the anus) TENDERNESS IN MOUTH AND THROAT WITH OR WITHOUT PRESENCE OF ULCERS (sore throat, sores in mouth, or a toothache) UNUSUAL RASH, SWELLING OR PAIN  UNUSUAL VAGINAL DISCHARGE OR ITCHING   Items with * indicate a potential emergency and should be followed up as soon as possible or go to the Emergency Department if any problems should occur.  Please show the CHEMOTHERAPY ALERT CARD or IMMUNOTHERAPY ALERT CARD at check-in  to the Emergency Department and triage nurse.  Should you have questions after your visit or need to cancel or reschedule your appointment, please contact [facility]  Dept: [phone]  and follow the prompts.  Office hours are 8:00 a.m. to 4:30 p.m. Monday - Friday. Please note that voicemails left after 4:00 p.m. may not be returned until the following business day.  We are closed weekends and major holidays. You have access to a nurse at all times for urgent questions. Please call the main number to the clinic Dept: [phone] and follow the prompts.   For any non-urgent questions, you may also contact your provider using MyChart. We now offer e-Visits for anyone 18 and older to request care online for non-urgent symptoms. For details visit [email or web address].   Also download the MyChart app! Go to the app store, search "MyChart", open the app, select [facility], and log in with your MyChart username and password.   

## 2023-01-08 ENCOUNTER — Ambulatory Visit (INDEPENDENT_AMBULATORY_CARE_PROVIDER_SITE_OTHER): Payer: Medicare Other | Admitting: Family Medicine

## 2023-01-08 ENCOUNTER — Encounter: Payer: Self-pay | Admitting: Family Medicine

## 2023-01-08 VITALS — BP 122/74 | HR 71 | Temp 98.1°F | Resp 16 | Ht 60.0 in | Wt 190.2 lb

## 2023-01-08 DIAGNOSIS — R109 Unspecified abdominal pain: Secondary | ICD-10-CM

## 2023-01-08 DIAGNOSIS — N390 Urinary tract infection, site not specified: Secondary | ICD-10-CM

## 2023-01-08 DIAGNOSIS — R1011 Right upper quadrant pain: Secondary | ICD-10-CM | POA: Diagnosis not present

## 2023-01-08 DIAGNOSIS — Z7984 Long term (current) use of oral hypoglycemic drugs: Secondary | ICD-10-CM | POA: Diagnosis not present

## 2023-01-08 DIAGNOSIS — E785 Hyperlipidemia, unspecified: Secondary | ICD-10-CM | POA: Diagnosis not present

## 2023-01-08 DIAGNOSIS — E1140 Type 2 diabetes mellitus with diabetic neuropathy, unspecified: Secondary | ICD-10-CM | POA: Diagnosis not present

## 2023-01-08 LAB — POCT URINALYSIS DIPSTICK
Glucose, UA: NEGATIVE
Protein, UA: NEGATIVE
Spec Grav, UA: 1.02 (ref 1.010–1.025)
Urobilinogen, UA: 0.2 E.U./dL
pH, UA: 6 (ref 5.0–8.0)

## 2023-01-08 MED ORDER — NITROFURANTOIN MONOHYD MACRO 100 MG PO CAPS
100.0000 mg | ORAL_CAPSULE | Freq: Two times a day (BID) | ORAL | 0 refills | Status: DC
Start: 2023-01-08 — End: 2023-02-12

## 2023-01-08 NOTE — Patient Instructions (Signed)
Follow up as needed or as scheduled We'll call you to schedule your CT scan.  Since you're allergic to contrast, we'll likely need to do this without contrast Continue to use the Gas-X for pain relief Make sure you are eating and drinking regularly If symptoms were to change or worsen- please go to the ER Call with any questions or concerns Hang in there!!!

## 2023-01-08 NOTE — Progress Notes (Signed)
   Subjective:    Patient ID: Tracy [name], female    DOB: [birth year], 77 y.o.   MRN: [id number]  HPI DM- chronic problem, on Metformin 500mg  BID.  UTD on microalbumin.  Due for foot exam and eye exam.  + neuropathy.  + muscular CP, no SOB.    Hyperlipidemia- chronic problem, on Lipitor 10mg  daily.  + nausea- following w/ Dr Christella Hartigan.  No vomiting.  R flank pain- pain started as intermittent pain but is now constant.  'it's been a minute'.  Pt reports area is TTP and painful to move in certain directions.  No blood in urine.  No pain w/ urination.  No hx of kidney stone.  No change in physical activity that would cause injury.  Mild relief w/ Gas-x.  Pt reports sxs improve after BM.  Pt reports that when she has to urinate, she will also pass small slivers of stool.     Review of Systems For ROS see HPI     Objective:   Physical Exam Vitals reviewed.  Constitutional:      General: She is not in acute distress.    Appearance: Normal appearance. She is well-developed. She is not ill-appearing.  HENT:     Head: Normocephalic and atraumatic.  Eyes:     Conjunctiva/sclera: Conjunctivae normal.     Pupils: Pupils are equal, round, and reactive to light.  Neck:     Thyroid: No thyromegaly.  Cardiovascular:     Rate and Rhythm: Normal rate and regular rhythm.     Heart sounds: Normal heart sounds. No murmur heard. Pulmonary:     Effort: Pulmonary effort is normal. No respiratory distress.     Breath sounds: Normal breath sounds.  Abdominal:     General: There is no distension.     Palpations: Abdomen is soft.     Tenderness: There is abdominal tenderness (over RUQ and R flank). There is guarding (both voluntary and involuntary guarding).  Musculoskeletal:     Cervical back: Normal range of motion and neck supple.  Lymphadenopathy:     Cervical: No cervical adenopathy.  Skin:    General: Skin is warm and dry.  Neurological:     Mental Status: She is alert and oriented to person,  place, and time.  Psychiatric:        Behavior: Behavior normal.           Assessment & Plan:  R flank pain/RUQ pain- new.  Pt reports this has been going on 'for a minute' but is unable to be more specific.  She says recently this pain has worsened and is now nearly constant.  Pt feels that sxs improve w/ BM but also notes that BM's have changed recently to 'small slivers'.  Discussed possibility of constipation but pt feels she had a complete BM this morning.  Pt does not have a hx of kidney stones and no current hematuria- but she does have leuks.  Will start Macrobid while awaiting culture.  Given her pain, change in bowel habits, possible stone- will get noncontrast CT of abd and pelvis (she has a contrast allergy).  Check labs to r/o underlying metabolic cause of pain.  Pt expressed understanding and is in agreement w/ plan.

## 2023-01-09 ENCOUNTER — Encounter: Payer: Self-pay | Admitting: Hematology

## 2023-01-09 ENCOUNTER — Telehealth: Payer: Self-pay | Admitting: Family Medicine

## 2023-01-09 ENCOUNTER — Telehealth: Payer: Self-pay

## 2023-01-09 LAB — CBC WITH DIFFERENTIAL/PLATELET
Basophils Absolute: 0 10*3/uL (ref 0.0–0.1)
Basophils Relative: 0.6 % (ref 0.0–3.0)
Eosinophils Absolute: 0.1 10*3/uL (ref 0.0–0.7)
Eosinophils Relative: 1.4 % (ref 0.0–5.0)
HCT: 32.5 % — ABNORMAL LOW (ref 36.0–46.0)
Hemoglobin: 10.7 g/dL — ABNORMAL LOW (ref 12.0–15.0)
Lymphocytes Relative: 43 % (ref 12.0–46.0)
Lymphs Abs: 2.8 10*3/uL (ref 0.7–4.0)
MCHC: 33.1 g/dL (ref 30.0–36.0)
MCV: 93.5 fl (ref 78.0–100.0)
Monocytes Absolute: 0.5 10*3/uL (ref 0.1–1.0)
Monocytes Relative: 7.7 % (ref 3.0–12.0)
Neutro Abs: 3.1 10*3/uL (ref 1.4–7.7)
Neutrophils Relative %: 47.3 % (ref 43.0–77.0)
Platelets: 316 10*3/uL (ref 150.0–400.0)
RBC: 3.47 Mil/uL — ABNORMAL LOW (ref 3.87–5.11)
RDW: 14 % (ref 11.5–15.5)
WBC: 6.6 10*3/uL (ref 4.0–10.5)

## 2023-01-09 LAB — TSH: TSH: 1.84 u[IU]/mL (ref 0.35–5.50)

## 2023-01-09 LAB — BASIC METABOLIC PANEL
BUN: 8 mg/dL (ref 6–23)
CO2: 26 mEq/L (ref 19–32)
Calcium: 8.6 mg/dL (ref 8.4–10.5)
Chloride: 104 mEq/L (ref 96–112)
Creatinine, Ser: 1.13 mg/dL (ref 0.40–1.20)
GFR: 46.51 mL/min — ABNORMAL LOW (ref >60.00)
Glucose, Bld: 76 mg/dL (ref 70–99)
Potassium: 3.6 mEq/L (ref 3.5–5.1)
Sodium: 140 mEq/L (ref 135–145)

## 2023-01-09 LAB — URINE CULTURE
MICRO NUMBER:: 15073644
Result:: NO GROWTH
SPECIMEN QUALITY:: ADEQUATE

## 2023-01-09 LAB — HEPATIC FUNCTION PANEL
ALT: 11 U/L (ref 0–35)
AST: 14 U/L (ref 0–37)
Albumin: 4.1 g/dL (ref 3.5–5.2)
Alkaline Phosphatase: 71 U/L (ref 39–117)
Bilirubin, Direct: 0.1 mg/dL (ref 0.0–0.3)
Total Bilirubin: 0.3 mg/dL (ref 0.2–1.2)
Total Protein: 6.7 g/dL (ref 6.0–8.3)

## 2023-01-09 LAB — LIPID PANEL
Cholesterol: 150 mg/dL (ref 0–200)
HDL: 57.8 mg/dL (ref >39.00)
LDL Cholesterol: 70 mg/dL (ref 0–99)
NonHDL: 92.01
Total CHOL/HDL Ratio: 3
Triglycerides: 111 mg/dL (ref 0.0–149.0)
VLDL: 22.2 mg/dL (ref 0.0–40.0)

## 2023-01-09 LAB — HEMOGLOBIN A1C: Hgb A1c MFr Bld: 6.7 % — ABNORMAL HIGH (ref 4.6–6.5)

## 2023-01-09 MED ORDER — GABAPENTIN 300 MG PO CAPS: 600.0000 mg | ORAL_CAPSULE | Freq: Three times a day (TID) | ORAL | 4 refills | Status: DC

## 2023-01-09 NOTE — Assessment & Plan Note (Signed)
Ongoing issue.  Pt's BMI is 37.16.  Coupled w/ her DM and hyperlipidemia, this qualifies as morbid obesity.  Encouraged low carb/low sugar diet and regular physical activity.  Will follow.

## 2023-01-09 NOTE — Telephone Encounter (Signed)
I spoke with this pt a few moments ago.  Looking at her chart gabapentin (NEURONTIN) 300 MG capsule Penumalli, Glenford Bayley, MD filled this 07/15/2022 540 caps-4 refills  I have sent this for Dr.[name] to review.  When I spoke with pt is expressed she is tired of [place] in this office and does not understand why we have so many new people and we dont know what we are talking about. Pt was yelling and very upset and is tired of [place].  Told pt I apologize for her her frustration and I would send this to Dr.[name] and see if I was missing anything. She then said I better call her back today.

## 2023-01-09 NOTE — Telephone Encounter (Signed)
I'm sorry she is frustrated but her current prescription is in [facility] name.  I will refill it for her but our office did the right thing in declining a prescription written by another office.  I know it can be hard to keep all the prescriptions and providers straight- which is why I'm happy to refill it.

## 2023-01-09 NOTE — Telephone Encounter (Signed)
Encourage patient to contact the pharmacy for refills or they can request refills through [facility]   WHAT PHARMACY WOULD THEY LIKE THIS SENT TO:  [id number] - [place], [place] - [address]   MEDICATION NAME & DOSE: gabapentin (NEURONTIN) 300 MG capsule   NOTES/COMMENTS FROM PATIENT:      Front office please notify patient: It takes 48-72 hours to process rx refill requests Ask patient to call pharmacy to ensure rx is ready before heading there.

## 2023-01-09 NOTE — Assessment & Plan Note (Signed)
Chronic problem.  Currently on Metformin 500mg  BID w/o difficulty.  UTD on microalbumin.  Foot exam done.  Due for eye exam.  Check labs.  Adjust meds prn

## 2023-01-09 NOTE — Telephone Encounter (Signed)
Left vm to let pt know Dr.[name] did fill this medication for her.

## 2023-01-09 NOTE — Telephone Encounter (Signed)
-----   Message from [name], MD sent at 01/09/2023  3:44 PM EDT ----- Labs are stable and look good!  No obvious cause of pain.  Diabetes is well controlled.  We'll see what the CT scan shows [place]

## 2023-01-09 NOTE — Telephone Encounter (Signed)
Encourage patient to contact the pharmacy for refills or they can request refills through [facility]   WHAT PHARMACY WOULD THEY LIKE THIS SENT TO:  [id number] - [place], [place] - [address]   MEDICATION NAME & DOSE: gabapentin (NEURONTIN) 300 MG capsule   NOTES/COMMENTS FROM PATIENT: Pt is on the phone stating someone just called her saying [name] didn't prescribe Gabapentin but she has the bottle there with [name] name on it. She wants this straightened out because she needs her meds.     Front office please notify patient: It takes 48-72 hours to process rx refill requests Ask patient to call pharmacy to ensure rx is ready before heading there.

## 2023-01-09 NOTE — Assessment & Plan Note (Signed)
Chronic problem.  Currently on Lipitor 10mg daily w/o difficulty.  Check labs.  Adjust meds prn  

## 2023-01-10 ENCOUNTER — Inpatient Hospital Stay: Payer: Medicare Other

## 2023-01-10 ENCOUNTER — Telehealth: Payer: Self-pay

## 2023-01-10 VITALS — BP 158/65 | HR 81 | Temp 98.6°F | Resp 16

## 2023-01-10 DIAGNOSIS — C9000 Multiple myeloma not having achieved remission: Secondary | ICD-10-CM | POA: Diagnosis not present

## 2023-01-10 DIAGNOSIS — Z5112 Encounter for antineoplastic immunotherapy: Secondary | ICD-10-CM | POA: Diagnosis not present

## 2023-01-10 DIAGNOSIS — I2510 Atherosclerotic heart disease of native coronary artery without angina pectoris: Secondary | ICD-10-CM | POA: Diagnosis not present

## 2023-01-10 DIAGNOSIS — D509 Iron deficiency anemia, unspecified: Secondary | ICD-10-CM | POA: Diagnosis not present

## 2023-01-10 DIAGNOSIS — Z801 Family history of malignant neoplasm of trachea, bronchus and lung: Secondary | ICD-10-CM | POA: Diagnosis not present

## 2023-01-10 DIAGNOSIS — E538 Deficiency of other specified B group vitamins: Secondary | ICD-10-CM | POA: Diagnosis not present

## 2023-01-10 DIAGNOSIS — E1122 Type 2 diabetes mellitus with diabetic chronic kidney disease: Secondary | ICD-10-CM | POA: Diagnosis not present

## 2023-01-10 DIAGNOSIS — N189 Chronic kidney disease, unspecified: Secondary | ICD-10-CM | POA: Diagnosis not present

## 2023-01-10 DIAGNOSIS — Z800 Family history of malignant neoplasm of digestive organs: Secondary | ICD-10-CM | POA: Diagnosis not present

## 2023-01-10 DIAGNOSIS — Z8673 Personal history of transient ischemic attack (TIA), and cerebral infarction without residual deficits: Secondary | ICD-10-CM | POA: Diagnosis not present

## 2023-01-10 DIAGNOSIS — I252 Old myocardial infarction: Secondary | ICD-10-CM | POA: Diagnosis not present

## 2023-01-10 DIAGNOSIS — Z87891 Personal history of nicotine dependence: Secondary | ICD-10-CM | POA: Diagnosis not present

## 2023-01-10 DIAGNOSIS — G2581 Restless legs syndrome: Secondary | ICD-10-CM | POA: Diagnosis not present

## 2023-01-10 DIAGNOSIS — Z95828 Presence of other vascular implants and grafts: Secondary | ICD-10-CM

## 2023-01-10 DIAGNOSIS — I129 Hypertensive chronic kidney disease with stage 1 through stage 4 chronic kidney disease, or unspecified chronic kidney disease: Secondary | ICD-10-CM | POA: Diagnosis not present

## 2023-01-10 DIAGNOSIS — C7951 Secondary malignant neoplasm of bone: Secondary | ICD-10-CM

## 2023-01-10 DIAGNOSIS — M545 Low back pain, unspecified: Secondary | ICD-10-CM | POA: Diagnosis not present

## 2023-01-10 DIAGNOSIS — E1140 Type 2 diabetes mellitus with diabetic neuropathy, unspecified: Secondary | ICD-10-CM | POA: Diagnosis not present

## 2023-01-10 DIAGNOSIS — Z923 Personal history of irradiation: Secondary | ICD-10-CM | POA: Diagnosis not present

## 2023-01-10 DIAGNOSIS — G629 Polyneuropathy, unspecified: Secondary | ICD-10-CM | POA: Diagnosis not present

## 2023-01-10 DIAGNOSIS — Z803 Family history of malignant neoplasm of breast: Secondary | ICD-10-CM | POA: Diagnosis not present

## 2023-01-10 DIAGNOSIS — G4733 Obstructive sleep apnea (adult) (pediatric): Secondary | ICD-10-CM | POA: Diagnosis not present

## 2023-01-10 DIAGNOSIS — Z853 Personal history of malignant neoplasm of breast: Secondary | ICD-10-CM | POA: Diagnosis not present

## 2023-01-10 DIAGNOSIS — Z79899 Other long term (current) drug therapy: Secondary | ICD-10-CM | POA: Diagnosis not present

## 2023-01-10 LAB — MULTIPLE MYELOMA PANEL, SERUM
Albumin SerPl Elph-Mcnc: 3.5 g/dL (ref 2.9–4.4)
Albumin/Glob SerPl: 1.4 (ref 0.7–1.7)
Alpha 1: 0.2 g/dL (ref 0.0–0.4)
Alpha2 Glob SerPl Elph-Mcnc: 1.1 g/dL — ABNORMAL HIGH (ref 0.4–1.0)
B-Globulin SerPl Elph-Mcnc: 0.9 g/dL (ref 0.7–1.3)
Gamma Glob SerPl Elph-Mcnc: 0.4 g/dL (ref 0.4–1.8)
Globulin, Total: 2.6 g/dL (ref 2.2–3.9)
IgA: 64 mg/dL (ref 64–422)
IgG (Immunoglobin G), Serum: 400 mg/dL — ABNORMAL LOW (ref 586–1602)
IgM (Immunoglobulin M), Srm: 41 mg/dL (ref 26–217)
M Protein SerPl Elph-Mcnc: 0.1 g/dL — ABNORMAL HIGH
Total Protein ELP: 6.1 g/dL (ref 6.0–8.5)

## 2023-01-10 MED ORDER — SODIUM CHLORIDE 0.9% FLUSH
10.0000 mL | Freq: Once | INTRAVENOUS | Status: AC
  Administered 2023-01-10: 10 mL

## 2023-01-10 MED ORDER — HEPARIN SOD (PORK) LOCK FLUSH 100 UNIT/ML IV SOLN
500.0000 [IU] | Freq: Once | INTRAVENOUS | Status: AC
  Administered 2023-01-10: 500 [IU]

## 2023-01-10 MED ORDER — SODIUM CHLORIDE 0.9 % IV SOLN
60.0000 mg | Freq: Once | INTRAVENOUS | Status: AC
  Administered 2023-01-10: 60 mg via INTRAVENOUS
  Filled 2023-01-10: qty 20

## 2023-01-10 NOTE — Patient Instructions (Signed)
Pamidronate Injection What is this medication? PAMIDRONATE (pa mi DROE nate) treats high calcium levels in the blood caused by cancer. It may also be used with chemotherapy to treat weakened bones caused by cancer. It can also be used to treat Paget's disease of the bone. It works by slowing down the release of calcium from bones. This lowers calcium levels in your blood. It also makes your bones stronger and less likely to break (fracture). It belongs to a group of medications called bisphosphonates. This medicine may be used for other purposes; ask your health care provider or pharmacist if you have questions. COMMON BRAND NAME(S): Aredia What should I tell my care team before I take this medication? They need to know if you have any of these conditions: Bleeding disorder Cancer Dental disease Kidney disease Low levels of calcium or other minerals in the blood Low red blood cell counts Receiving steroids, such as dexamethasone or prednisone An unusual or allergic reaction to pamidronate, other medications, foods, dyes or preservatives Pregnant or trying to get pregnant Breast-feeding How should I use this medication? This medication is injected into a vein. It is given by your care team in a hospital or clinic setting. Talk to your care team about the use of this medication in children. Special care may be needed. Overdosage: If you think you have taken too much of this medicine contact a poison control center or emergency room at once. NOTE: This medicine is only for you. Do not share this medicine with others. What if I miss a dose? Keep appointments for follow-up doses. It is important not to miss your dose. Call your care team if you are unable to keep an appointment. What may interact with this medication? Certain antibiotics given by injection Medications for inflammation or pain, such as ibuprofen, naproxen Some diuretics, such as bumetanide, furosemide Cyclosporine Parathyroid  hormone Tacrolimus Teriparatide Thalidomide This list may not describe all possible interactions. Give your health care provider a list of all the medicines, herbs, non-prescription drugs, or dietary supplements you use. Also tell them if you smoke, drink alcohol, or use illegal drugs. Some items may interact with your medicine. What should I watch for while using this medication? Visit your care team for regular checks on your progress. It may be some time before you see the benefit from this medication. Some people who take this medication have severe bone, joint, or muscle pain. This medication may also increase your risk for jaw problems or a broken thigh bone. Tell your care team right away if you have severe pain in your jaw, bones, joints, or muscles. Tell your care team if you have any pain that does not go away or that gets worse. Tell your dentist and dental surgeon that you are taking this medication. You should not have major dental surgery while on this medication. See your dentist to have a dental exam and fix any dental problems before starting this medication. Take good care of your teeth while on this medication. Make sure you see your dentist for regular follow-up appointments. You should make sure you get enough calcium and vitamin D while you are taking this medication. Discuss the foods you eat and the vitamins you take with your care team. You may need bloodwork while you are taking this medication. Talk to your care team if you wish to become pregnant or think you might be pregnant. This medication can cause serious birth defects. What side effects may I notice from receiving  this medication? Side effects that you should report to your care team as soon as possible: Allergic reactions--skin rash, itching, hives, swelling of the face, lips, tongue, or throat Kidney injury--decrease in the amount of urine, swelling of the ankles, hands, or feet Low calcium level--muscle pain or  cramps, confusion, tingling, or numbness in the hands or feet Osteonecrosis of the jaw--pain, swelling, or redness in the mouth, numbness of the jaw, poor healing after dental work, unusual discharge from the mouth, visible bones in the mouth Severe bone, joint, or muscle pain Side effects that usually do not require medical attention (report to your care team if they continue or are bothersome): Constipation Fatigue Fever Loss of appetite Nausea Pain, redness, or irritation at injection site Stomach pain This list may not describe all possible side effects. Call your doctor for medical advice about side effects. You may report side effects to FDA at 1-800-FDA-1088. Where should I keep my medication? This medication is given in a hospital or clinic. It will not be stored at home. NOTE: This sheet is a summary. It may not cover all possible information. If you have questions about this medicine, talk to your doctor, pharmacist, or health care provider.  2024 Elsevier/Gold Standard (2021-09-03 00:00:00)

## 2023-01-10 NOTE — Progress Notes (Signed)
Pt reports no recent dental procedures or dental/jaw pain.

## 2023-01-10 NOTE — Telephone Encounter (Signed)
-----   Message from [name], MD sent at 01/09/2023  8:06 PM EDT ----- No evidence of UTI.  You can stop the Macrobid since there is no infection

## 2023-01-13 ENCOUNTER — Telehealth: Payer: Self-pay

## 2023-01-13 NOTE — Telephone Encounter (Signed)
Pt was last seen 6/12 for follow up. She would like to know if you can prescribe Lidocaine 5% patches.  Please further advise

## 2023-01-13 NOTE — Telephone Encounter (Signed)
Pt notes increased frequency of urination back pain has stightly improved from last visit wonders if she needs to return for re testing or other potential concerns?

## 2023-01-13 NOTE — Telephone Encounter (Signed)
If she is worried b/c her symptoms have changed, I am happy for her to come back in and be evaluated.  If things are improving, we could wait and see if she chooses

## 2023-01-14 ENCOUNTER — Telehealth: Payer: Self-pay

## 2023-01-14 ENCOUNTER — Telehealth: Payer: Self-pay | Admitting: Family Medicine

## 2023-01-14 NOTE — Telephone Encounter (Signed)
Caller name: MACIAH KOSTER  On DPR?: Yes  Call back number: [phone] (mobile)  Provider they see: [name], MD  Reason for call:   Tonya call patient back she was unclear on what the message said.

## 2023-01-14 NOTE — Telephone Encounter (Signed)
Pt advised with verbal understanding and agreed to plan

## 2023-01-14 NOTE — Telephone Encounter (Signed)
Left vm for pt to call and make appt

## 2023-01-14 NOTE — Telephone Encounter (Signed)
Pt reports she will call back Friday if sx has not improved. Pt reports she feels a little better today.

## 2023-01-14 NOTE — Telephone Encounter (Signed)
-----   Message from Shanon Ace, PA-C sent at 01/14/2023  2:08 PM EDT ----- Regarding: lab results Myriam Jacobson,  Can you please call this patient to let her know her labs are stable compared to prior visit. There is no intervention needed at this time and she should keep her next appt with Karena Addison that is already scheduled for 07/05.  Thank you, Jae Dire

## 2023-01-15 ENCOUNTER — Telehealth: Payer: Self-pay | Admitting: Family Medicine

## 2023-01-15 NOTE — Telephone Encounter (Signed)
Patient wanted to inform us that she is still having pain but it is not too bad "improved but started to go back"  Also patient is supposed to have a CT scan, never got an update about that. Please advise

## 2023-01-15 NOTE — Telephone Encounter (Signed)
Pt states she is hurting in her side next to her groin on right side that is moving to her back . States it hurts to sit down and she is uncomfortable . She is asking if we can send her in something for the pain since she was seen last week ?

## 2023-01-15 NOTE — Telephone Encounter (Signed)
Caller name: KATHIRIA PARTHASARATHY  On DPR?: Yes  Call back number: [phone] (home)  Provider they see: [name], MD  Reason for call:   Pt would like medication for back pain.

## 2023-01-16 MED ORDER — LIDOCAINE 5 % EX PTCH: 1.0000 | MEDICATED_PATCH | CUTANEOUS | 0 refills | Status: DC

## 2023-01-16 NOTE — Telephone Encounter (Signed)
Prescription for lidocaine patches sent as requested

## 2023-01-16 NOTE — Telephone Encounter (Signed)
I sent in the lidocaine patches for her this morning.  Please see if there's anything else she needs at this time

## 2023-01-16 NOTE — Addendum Note (Signed)
Addended by: [name] on: 01/16/2023 07:36 AM   Modules accepted: Orders

## 2023-01-16 NOTE — Telephone Encounter (Signed)
In her chart, it indicates that her CT scan is scheduled for 7/1

## 2023-01-16 NOTE — Telephone Encounter (Signed)
Pt is aware. She will call back if she needs you.

## 2023-01-16 NOTE — Telephone Encounter (Signed)
Noted  

## 2023-01-25 ENCOUNTER — Other Ambulatory Visit: Payer: Self-pay | Admitting: Family Medicine

## 2023-01-25 DIAGNOSIS — E785 Hyperlipidemia, unspecified: Secondary | ICD-10-CM

## 2023-01-27 ENCOUNTER — Ambulatory Visit (HOSPITAL_COMMUNITY)
Admission: RE | Admit: 2023-01-27 | Discharge: 2023-01-27 | Disposition: A | Discharge status: Home / Self Care | Payer: Medicare Other | Source: Ambulatory Visit | Attending: Family Medicine | Admitting: Family Medicine

## 2023-01-27 DIAGNOSIS — K429 Umbilical hernia without obstruction or gangrene: Secondary | ICD-10-CM | POA: Diagnosis not present

## 2023-01-27 DIAGNOSIS — R1011 Right upper quadrant pain: Secondary | ICD-10-CM | POA: Diagnosis not present

## 2023-01-27 DIAGNOSIS — R109 Unspecified abdominal pain: Secondary | ICD-10-CM | POA: Insufficient documentation

## 2023-01-27 DIAGNOSIS — K5710 Diverticulosis of small intestine without perforation or abscess without bleeding: Secondary | ICD-10-CM | POA: Diagnosis not present

## 2023-01-31 ENCOUNTER — Inpatient Hospital Stay: Payer: Medicare Other

## 2023-01-31 ENCOUNTER — Inpatient Hospital Stay: Payer: Medicare Other | Attending: Hematology

## 2023-01-31 ENCOUNTER — Other Ambulatory Visit: Payer: Self-pay

## 2023-01-31 ENCOUNTER — Inpatient Hospital Stay: Payer: Medicare Other | Admitting: Physician Assistant

## 2023-01-31 ENCOUNTER — Other Ambulatory Visit: Payer: Medicare Other

## 2023-01-31 VITALS — BP 151/69 | HR 72 | Temp 98.6°F | Resp 15 | Ht 60.0 in | Wt 187.4 lb

## 2023-01-31 DIAGNOSIS — G2581 Restless legs syndrome: Secondary | ICD-10-CM | POA: Diagnosis not present

## 2023-01-31 DIAGNOSIS — R110 Nausea: Secondary | ICD-10-CM | POA: Insufficient documentation

## 2023-01-31 DIAGNOSIS — Z8673 Personal history of transient ischemic attack (TIA), and cerebral infarction without residual deficits: Secondary | ICD-10-CM | POA: Diagnosis not present

## 2023-01-31 DIAGNOSIS — Z853 Personal history of malignant neoplasm of breast: Secondary | ICD-10-CM | POA: Insufficient documentation

## 2023-01-31 DIAGNOSIS — Z803 Family history of malignant neoplasm of breast: Secondary | ICD-10-CM | POA: Insufficient documentation

## 2023-01-31 DIAGNOSIS — N189 Chronic kidney disease, unspecified: Secondary | ICD-10-CM | POA: Insufficient documentation

## 2023-01-31 DIAGNOSIS — Z923 Personal history of irradiation: Secondary | ICD-10-CM | POA: Diagnosis not present

## 2023-01-31 DIAGNOSIS — E1122 Type 2 diabetes mellitus with diabetic chronic kidney disease: Secondary | ICD-10-CM | POA: Diagnosis not present

## 2023-01-31 DIAGNOSIS — Z7189 Other specified counseling: Secondary | ICD-10-CM

## 2023-01-31 DIAGNOSIS — Z79899 Other long term (current) drug therapy: Secondary | ICD-10-CM | POA: Diagnosis not present

## 2023-01-31 DIAGNOSIS — Z90710 Acquired absence of both cervix and uterus: Secondary | ICD-10-CM | POA: Diagnosis not present

## 2023-01-31 DIAGNOSIS — I252 Old myocardial infarction: Secondary | ICD-10-CM | POA: Insufficient documentation

## 2023-01-31 DIAGNOSIS — G629 Polyneuropathy, unspecified: Secondary | ICD-10-CM | POA: Insufficient documentation

## 2023-01-31 DIAGNOSIS — Z801 Family history of malignant neoplasm of trachea, bronchus and lung: Secondary | ICD-10-CM | POA: Insufficient documentation

## 2023-01-31 DIAGNOSIS — G4733 Obstructive sleep apnea (adult) (pediatric): Secondary | ICD-10-CM | POA: Insufficient documentation

## 2023-01-31 DIAGNOSIS — E538 Deficiency of other specified B group vitamins: Secondary | ICD-10-CM | POA: Diagnosis present

## 2023-01-31 DIAGNOSIS — I129 Hypertensive chronic kidney disease with stage 1 through stage 4 chronic kidney disease, or unspecified chronic kidney disease: Secondary | ICD-10-CM | POA: Diagnosis not present

## 2023-01-31 DIAGNOSIS — C9000 Multiple myeloma not having achieved remission: Secondary | ICD-10-CM

## 2023-01-31 DIAGNOSIS — E1140 Type 2 diabetes mellitus with diabetic neuropathy, unspecified: Secondary | ICD-10-CM | POA: Diagnosis not present

## 2023-01-31 DIAGNOSIS — D509 Iron deficiency anemia, unspecified: Secondary | ICD-10-CM | POA: Diagnosis not present

## 2023-01-31 DIAGNOSIS — Z800 Family history of malignant neoplasm of digestive organs: Secondary | ICD-10-CM | POA: Insufficient documentation

## 2023-01-31 DIAGNOSIS — I2510 Atherosclerotic heart disease of native coronary artery without angina pectoris: Secondary | ICD-10-CM | POA: Diagnosis not present

## 2023-01-31 DIAGNOSIS — C7951 Secondary malignant neoplasm of bone: Secondary | ICD-10-CM

## 2023-01-31 DIAGNOSIS — Z5112 Encounter for antineoplastic immunotherapy: Secondary | ICD-10-CM | POA: Insufficient documentation

## 2023-01-31 DIAGNOSIS — Z87891 Personal history of nicotine dependence: Secondary | ICD-10-CM | POA: Insufficient documentation

## 2023-01-31 LAB — CMP (CANCER CENTER ONLY)
ALT: 10 U/L (ref 0–44)
AST: 14 U/L — ABNORMAL LOW (ref 15–41)
Albumin: 3.9 g/dL (ref 3.5–5.0)
Alkaline Phosphatase: 71 U/L (ref 38–126)
Anion gap: 12 (ref 5–15)
BUN: 12 mg/dL (ref 8–23)
CO2: 24 mmol/L (ref 22–32)
Calcium: 8.8 mg/dL — ABNORMAL LOW (ref 8.9–10.3)
Chloride: 108 mmol/L (ref 98–111)
Creatinine: 1.21 mg/dL — ABNORMAL HIGH (ref 0.44–1.00)
GFR, Estimated: 46 mL/min — ABNORMAL LOW (ref >60)
Glucose, Bld: 109 mg/dL — ABNORMAL HIGH (ref 70–99)
Potassium: 3.5 mmol/L (ref 3.5–5.1)
Sodium: 144 mmol/L (ref 135–145)
Total Bilirubin: 0.4 mg/dL (ref 0.3–1.2)
Total Protein: 5.9 g/dL — ABNORMAL LOW (ref 6.5–8.1)

## 2023-01-31 LAB — CBC WITH DIFFERENTIAL (CANCER CENTER ONLY)
Abs Immature Granulocytes: 0 10*3/uL (ref 0.00–0.07)
Basophils Absolute: 0 10*3/uL (ref 0.0–0.1)
Basophils Relative: 1 %
Eosinophils Absolute: 0.1 10*3/uL (ref 0.0–0.5)
Eosinophils Relative: 3 %
HCT: 30.8 % — ABNORMAL LOW (ref 36.0–46.0)
Hemoglobin: 10.4 g/dL — ABNORMAL LOW (ref 12.0–15.0)
Immature Granulocytes: 0 %
Lymphocytes Relative: 45 %
Lymphs Abs: 2.2 10*3/uL (ref 0.7–4.0)
MCH: 31.5 pg (ref 26.0–34.0)
MCHC: 33.8 g/dL (ref 30.0–36.0)
MCV: 93.3 fL (ref 80.0–100.0)
Monocytes Absolute: 0.3 10*3/uL (ref 0.1–1.0)
Monocytes Relative: 7 %
Neutro Abs: 2.1 10*3/uL (ref 1.7–7.7)
Neutrophils Relative %: 44 %
Platelet Count: 309 10*3/uL (ref 150–400)
RBC: 3.3 MIL/uL — ABNORMAL LOW (ref 3.87–5.11)
RDW: 13.3 % (ref 11.5–15.5)
WBC Count: 4.7 10*3/uL (ref 4.0–10.5)
nRBC: 0 % (ref 0.0–0.2)

## 2023-01-31 MED ORDER — SODIUM CHLORIDE 0.9 % IV SOLN: Freq: Once | INTRAVENOUS | Status: AC

## 2023-01-31 MED ORDER — SODIUM CHLORIDE 0.9 % IV SOLN
16.0000 mg/kg | Freq: Once | INTRAVENOUS | Status: AC
  Administered 2023-01-31: 1300 mg via INTRAVENOUS
  Filled 2023-01-31: qty 5

## 2023-01-31 MED ORDER — SODIUM CHLORIDE 0.9% FLUSH
10.0000 mL | INTRAVENOUS | Status: DC | PRN
  Administered 2023-01-31: 10 mL

## 2023-01-31 MED ORDER — ACETAMINOPHEN 325 MG PO TABS
650.0000 mg | ORAL_TABLET | Freq: Once | ORAL | Status: AC
  Administered 2023-01-31: 650 mg via ORAL
  Filled 2023-01-31: qty 2

## 2023-01-31 MED ORDER — GABAPENTIN 300 MG PO CAPS: 600.0000 mg | ORAL_CAPSULE | Freq: Three times a day (TID) | ORAL | 4 refills | Status: DC

## 2023-01-31 MED ORDER — ONDANSETRON HCL 8 MG PO TABS: 8.0000 mg | ORAL_TABLET | Freq: Three times a day (TID) | ORAL | 0 refills | Status: AC | PRN

## 2023-01-31 MED ORDER — DIPHENHYDRAMINE HCL 25 MG PO CAPS
50.0000 mg | ORAL_CAPSULE | Freq: Once | ORAL | Status: AC
  Administered 2023-01-31: 50 mg via ORAL
  Filled 2023-01-31: qty 2

## 2023-01-31 MED ORDER — FAMOTIDINE IN NACL 20-0.9 MG/50ML-% IV SOLN
20.0000 mg | Freq: Once | INTRAVENOUS | Status: AC
  Administered 2023-01-31: 20 mg via INTRAVENOUS
  Filled 2023-01-31: qty 50

## 2023-01-31 MED ORDER — SODIUM CHLORIDE 0.9 % IV SOLN
16.0000 mg | Freq: Once | INTRAVENOUS | Status: AC
  Administered 2023-01-31: 16 mg via INTRAVENOUS
  Filled 2023-01-31: qty 1.6

## 2023-01-31 MED ORDER — CYANOCOBALAMIN 1000 MCG/ML IJ SOLN
1000.0000 ug | Freq: Once | INTRAMUSCULAR | Status: AC
  Administered 2023-01-31: 1000 ug via INTRAMUSCULAR
  Filled 2023-01-31: qty 1

## 2023-01-31 MED ORDER — HEPARIN SOD (PORK) LOCK FLUSH 100 UNIT/ML IV SOLN
500.0000 [IU] | Freq: Once | INTRAVENOUS | Status: AC | PRN
  Administered 2023-01-31: 500 [IU]

## 2023-01-31 NOTE — Patient Instructions (Signed)
[facility]  Discharge Instructions: Thank you for choosing [facility] to provide your oncology and hematology care.   If you have a lab appointment with the Cancer Center, please go directly to the Cancer Center and check in at the registration area.   Wear comfortable clothing and clothing appropriate for easy access to any Portacath or PICC line.   We strive to give you quality time with your provider. You may need to reschedule your appointment if you arrive late (15 or more minutes).  Arriving late affects you and other patients whose appointments are after yours.  Also, if you miss three or more appointments without notifying the office, you may be dismissed from the clinic at the provider's discretion.      For prescription refill requests, have your pharmacy contact our office and allow 72 hours for refills to be completed.    Today you received the following chemotherapy and/or immunotherapy agents: Daratumumab.       To help prevent nausea and vomiting after your treatment, we encourage you to take your nausea medication as directed.  BELOW ARE SYMPTOMS THAT SHOULD BE REPORTED IMMEDIATELY: *FEVER GREATER THAN 100.4 F (38 C) OR HIGHER *CHILLS OR SWEATING *NAUSEA AND VOMITING THAT IS NOT CONTROLLED WITH YOUR NAUSEA MEDICATION *UNUSUAL SHORTNESS OF BREATH *UNUSUAL BRUISING OR BLEEDING *URINARY PROBLEMS (pain or burning when urinating, or frequent urination) *BOWEL PROBLEMS (unusual diarrhea, constipation, pain near the anus) TENDERNESS IN MOUTH AND THROAT WITH OR WITHOUT PRESENCE OF ULCERS (sore throat, sores in mouth, or a toothache) UNUSUAL RASH, SWELLING OR PAIN  UNUSUAL VAGINAL DISCHARGE OR ITCHING   Items with * indicate a potential emergency and should be followed up as soon as possible or go to the Emergency Department if any problems should occur.  Please show the CHEMOTHERAPY ALERT CARD or IMMUNOTHERAPY ALERT CARD at  check-in to the Emergency Department and triage nurse.  Should you have questions after your visit or need to cancel or reschedule your appointment, please contact [facility]  Dept: [phone]  and follow the prompts.  Office hours are 8:00 a.m. to 4:30 p.m. Monday - Friday. Please note that voicemails left after 4:00 p.m. may not be returned until the following business day.  We are closed weekends and major holidays. You have access to a nurse at all times for urgent questions. Please call the main number to the clinic Dept: [phone] and follow the prompts.   For any non-urgent questions, you may also contact your provider using MyChart. We now offer e-Visits for anyone 18 and older to request care online for non-urgent symptoms. For details visit [email or web address].   Also download the MyChart app! Go to the app store, search "MyChart", open the app, select [facility], and log in with your MyChart username and password.   

## 2023-01-31 NOTE — Telephone Encounter (Signed)
Pt aware of refill.

## 2023-01-31 NOTE — Telephone Encounter (Signed)
Gabapentin   LOV: 01/09/23 Last Refill:01/09/23 Upcoming appt: 7.8.24

## 2023-01-31 NOTE — Progress Notes (Signed)
[facility] Follow up:   Date of Service: 01/31/23   Tracy Hatch, MD [address] [place] [address]  CC: Follow-up for continued evaluation and management of multiple myeloma  SUMMARY OF ONCOLOGIC HISTORY: Oncology History  History of right breast cancer  04/16/2000 Surgery   Left breast: Triple negative  invasive ductal cancer treated with lumpectomy, adjuvant chemotherapy, radiation , in [place], unknown stage   06/07/2015 Mammogram   Right breast mass 6x 6 x 5 mm, right axillary lymph node with slight cortex thickening measured 5 mm    06/13/2015 Initial Diagnosis   Right breast needle biopsy: Invasive ductal carcinoma, grade 1, right axillary lymph node biopsy negative , ER 95%, PR 5%, Ki-67 10%, HER-2 negative   06/13/2015 Clinical Stage   Stage IA: T1b N0   07/07/2015 Surgery   Right lumpectomy: Invasive ductal carcinoma grade 1, 1 cm span, with low-grade DCIS, DCIS focally 0.1 cm to inferior margin, 0/3 lymph nodes negative, ER 95%, PR 5%, HER-2 negative ratio 1.1, Ki-67 10%   07/07/2015 Pathologic Stage   Stage IA: T1c N0   07/13/2015 Procedure   Breast High/Moderate Risk Panel reveals no clinically significant variant at ATM, BRCA1, BRCA2, CDH1, CHEK2, PALB2, PTEN, and TP53.     08/23/2015 - 09/21/2015 Radiation Therapy   Adjuvant Radiation: Right breast/ 42.5Gy at 2.5 Gy per fraction x 17 fractions.   Right breast boost/ 7.5 Gy at 2.5 Gy per fraction x 3 fractions    Anti-estrogen oral therapy   Patient refused antiestrogen therapy   10/20/2015 Survivorship   Survivorship care plan completed and mailed to patient in lieu of in person visit at her request   Iron deficiency anemia  Multiple myeloma  01/25/2020 Initial Diagnosis   Multiple myeloma (HCC)   02/11/2020 - 05/19/2020 Adjuvant Chemotherapy   Daratumumab Weekly    02/29/2020 - 03/13/2020 Radiation Therapy   The targets were treated to a total dose of 20 Gy in 10  fractions of 2 Gy each to the left hip and L5 using one plan.   06/02/2020 - 10/05/2020 Adjuvant Chemotherapy   Daratumumab and Carfilzomib   10/19/2020 -  Adjuvant Chemotherapy   Maintenance Daratumumab--initially every 2 weeks, then every 4 weeks.    05/24/2022 -  Chemotherapy   Patient is on Treatment Plan : MYELOMA Daratumumab IV q28d     Malignant neoplasm metastatic to bone  02/08/2021 Initial Diagnosis   Bone metastases (HCC)   05/24/2022 -  Chemotherapy   Patient is on Treatment Plan : MYELOMA Daratumumab IV q28d       CURRENT THERAPY: Daratumumab/B12 injection   INTERVAL HISTORY:  Tracy [name] is here for continued evaluation and management of multiple myeloma. She was last seen by Dr. Candise Che on 12/06/2022.   Today, she reports that she is more fatigued and drowsy in the morning. She takes longer to get out of bed. She suspects it is secondary to gabapentin that she takes in the evening. She reports she is able to complete her baseline ADLs. She denies any appetite or weight changes. She has noticed post prandial nausea but unable to identify if certain foods trigger the nausea. She denies any vomiting episodes of abdominal pain. Her bowel habits are unchanged without recurrent episodes of diarrhea or constipation. She denies easy bruising or signs of bleeding. She reports stable neuropathy in her hands that does affect her grip. She denies fevers, chills, sweats, shortness of  breath, chest pain or cough. She has no other complaints.    Patient Active Problem List   Diagnosis Date Noted   RLS (restless legs syndrome) 06/12/2022   Malignant neoplasm metastatic to bone 02/08/2021   Angiodysplasia of intestine 08/23/2020   Port-A-Cath in place 08/04/2020   Counseling regarding advance care planning and goals of care 02/07/2020   Multiple myeloma 01/25/2020   Dizziness 10/12/2019   Post-nasal drainage 10/12/2019   Allergic rhinitis 08/19/2018   Type 2 diabetes mellitus with  diabetic neuropathy, unspecified 11/05/2017   Encounter for long-term use of muscle relaxants 09/24/2017   CAD in native artery 09/24/2017   OSA (obstructive sleep apnea) 09/24/2017   Snorings 09/24/2017   Asthenia 06/30/2017   Morbid obesity 06/02/2017   Depression 06/02/2017   Iron deficiency anemia 09/23/2016   Anemia of chronic disease 09/28/2015   Genetic testing 08/21/2015   History of left breast cancer 07/13/2015   History of right breast cancer 06/20/2015   Hearing loss due to cerumen impaction 12/29/2014   Allergy to adhesive tape 05/19/2014   Hyperlipidemia 12/06/2013   GERD (gastroesophageal reflux disease) 12/06/2013   Cervical disc disease 11/11/2013   Osteopenia 05/25/2013   Allergic asthma 12/24/2012   Insomnia 04/02/2012   Physical exam, annual 04/02/2012   HTN (hypertension) 02/03/2012   Vertigo, benign positional 02/03/2012   Left groin pain 02/03/2012   Hip pain 10/10/2011   TIA (transient ischemic attack) 07/24/2011   Allergic reaction 07/05/2011   Osteoarthrosis involving lower leg 10/19/2010   Disorder of bone and cartilage 05/01/2010   Generalized anxiety disorder 05/01/2010   Vitamin D deficiency 02/20/2010   Unspecified chronic bronchitis 08/24/2009   Other lymphedema 10/29/2007    is allergic to bacitracin-neomycin-polymyxin  [neomycin-bacitracin zn-polymyx], nsaids, tape, ambien [zolpidem tartrate], amoxicillin, clavulanic acid, contrast media [iodinated contrast media], latex, prednisone, and tessalon [benzonatate].  MEDICAL HISTORY: Past Medical History:  Diagnosis Date   Angiodysplasia of intestine 08/23/2020   Anxiety    Arthritis    Breast cancer (HCC) 06/13/15   Cancer (HCC) 2000   breast cancer   Chronic bronchitis (HCC)    Chronic bronchitis (HCC)    Hyperlipidemia    Hypertension    Myocardial infarction [facility]) 2001   Personal history of radiation therapy    Restless leg    Stroke (HCC) 2004   TIA, no deficits    SURGICAL  HISTORY: Past Surgical History:  Procedure Laterality Date   ABDOMINAL HYSTERECTOMY  1985   BREAST LUMPECTOMY Left 2000   radiation and chemo   BREAST LUMPECTOMY Right 2016   radiation   BREAST SURGERY  2001   lt breast lumpectomy   COLONOSCOPY WITH ESOPHAGOGASTRODUODENOSCOPY (EGD)  04/2020   ENTEROSCOPY N/A 03/15/2021   Procedure: ENTEROSCOPY;  Surgeon: Rachael Fee, MD;  Location: [facility];  Service: Endoscopy;  Laterality: N/A;   GIVENS CAPSULE STUDY  07/2020   HOT HEMOSTASIS N/A 03/15/2021   Procedure: HOT HEMOSTASIS (ARGON PLASMA COAGULATION/BICAP);  Surgeon: Rachael Fee, MD;  Location: [facility];  Service: Endoscopy;  Laterality: N/A;   IR IMAGING GUIDED PORT INSERTION  04/25/2020   RADIOACTIVE SEED GUIDED PARTIAL MASTECTOMY WITH AXILLARY SENTINEL LYMPH NODE BIOPSY Right 07/07/2015   Procedure: RIGHT RADIOACTIVE SEED GUIDED PARTIAL MASTECTOMY WITH AXILLARY SENTINEL LYMPH NODE BIOPSY;  Surgeon: Chevis Pretty III, MD;  Location: [facility];  Service: General;  Laterality: Right;   SMALL INTESTINE SURGERY     TUBAL LIGATION     UPPER GASTROINTESTINAL  ENDOSCOPY      SOCIAL HISTORY: Social History   Socioeconomic History   Marital status: Divorced    Spouse name: Not on file   Number of children: 7   Years of education: Not on file   Highest education level: Not on file  Occupational History   Occupation: retired  Tobacco Use   Smoking status: Former    Packs/day: 1.00    Years: 20.00    Additional pack years: 0.00    Total pack years: 20.00    Types: Cigarettes    Quit date: 07/30/2011    Years since quitting: 11.2   Smokeless tobacco: Never   Tobacco comments:    Quit >4 years ago; 1 ppd for about 5/20 years (remaining was less)  Vaping Use   Vaping Use: Former  Substance and Sexual Activity   Alcohol use: No    Alcohol/week: 0.0 standard drinks of alcohol   Drug use: No   Sexual activity: Not Currently  Other Topics Concern   Not on  file  Social History Narrative   Lives alone.  Retired.  Education:  11th grade GED.  Children:  7 (one here).    Social Determinants of Health   Financial Resource Strain: High Risk (12/21/2021)   Overall Financial Resource Strain (CARDIA)    Difficulty of Paying Living Expenses: Hard  Food Insecurity: No Food Insecurity (08/02/2022)   Hunger Vital Sign    Worried About Running Out of Food in the Last Year: Never true    Ran Out of Food in the Last Year: Never true  Transportation Needs: No Transportation Needs (08/02/2022)   PRAPARE - Administrator, Civil Service (Medical): No    Lack of Transportation (Non-Medical): No  Physical Activity: Inactive (12/20/2021)   Exercise Vital Sign    Days of Exercise per Week: 0 days    Minutes of Exercise per Session: 0 min  Stress: No Stress Concern Present (12/20/2021)   [employer] of Occupational Health - Occupational Stress Questionnaire    Feeling of Stress : Not at all  Social Connections: Moderately Integrated (12/20/2021)   Social Connection and Isolation Panel [NHANES]    Frequency of Communication with Friends and Family: More than three times a week    Frequency of Social Gatherings with Friends and Family: More than three times a week    Attends Religious Services: More than 4 times per year    Active Member of [employer] or Organizations: Yes    Attends Engineer, structural: More than 4 times per year    Marital Status: Divorced  Intimate Partner Violence: Not At Risk (12/20/2021)   Humiliation, Afraid, Rape, and Kick questionnaire    Fear of Current or Ex-Partner: No    Emotionally Abused: No    Physically Abused: No    Sexually Abused: No    FAMILY HISTORY: Family History  Problem Relation Age of Onset   Emphysema Mother 65       smoker   Diabetes Father    Lung cancer Sister        dx. <50; former smoker   Diabetes Brother    Diabetes Brother    Brain cancer Brother 41       unknown tumor type    Diabetes Paternal Aunt    Stroke Maternal Grandmother    Diabetes Paternal Grandmother    Cancer Daughter 45       neck cancer   Other Daughter  hysterectomy for unspecified reason   Colon cancer Daughter    Breast cancer Cousin    Cancer Cousin        unspecified type   Breast cancer Other        triple negative breast cancer in her 67s   Colon polyps Neg Hx    Esophageal cancer Neg Hx    Gallbladder disease Neg Hx    ROS  10 Point review of Systems was done is negative except as noted above.   PHYSICAL EXAMINATION  ECOG PERFORMANCE STATUS: 1 - Symptomatic but completely ambulatory  Vitals:   11/08/22 1029  BP: 138/64  Pulse: 78  Resp: 17  Temp: 97.6 F (36.4 C)  SpO2: 100%   GENERAL:alert, in no acute distress and comfortable SKIN: no acute rashes, no significant lesions EYES: conjunctiva are pink and non-injected, sclera anicteric LYMPH:  no palpable lymphadenopathy in the cervical or supraclavicular regions LUNGS: clear to auscultation b/l with normal respiratory effort Extremity: no pedal edema PSYCH: alert & oriented x 3 with fluent speech NEURO: no focal motor/sensory deficits    LABORATORY DATA: .    Latest Ref Rng & Units 11/08/2022   10:02 AM 10/11/2022   10:57 AM 09/13/2022   11:35 AM  CBC  WBC 4.0 - 10.5 K/uL 4.6  5.0  5.2   Hemoglobin 12.0 - 15.0 g/dL 16.1  09.6  04.5   Hematocrit 36.0 - 46.0 % 29.0  30.9  29.7   Platelets 150 - 400 K/uL 273  256  288    .    Latest Ref Rng & Units 11/08/2022   10:02 AM 10/11/2022   10:57 AM 09/13/2022   11:35 AM  CMP  Glucose 70 - 99 mg/dL 409  811  914   BUN 8 - 23 mg/dL 11  14  15    Creatinine 0.44 - 1.00 mg/dL 7.82  9.56  2.13   Sodium 135 - 145 mmol/L 143  142  141   Potassium 3.5 - 5.1 mmol/L 3.9  4.1  3.9   Chloride 98 - 111 mmol/L 109  107  107   CO2 22 - 32 mmol/L 24  25  24    Calcium 8.9 - 10.3 mg/dL 8.8  9.3  8.8   Total Protein 6.5 - 8.1 g/dL 6.5  6.7  6.4   Total Bilirubin 0.3 - 1.2  mg/dL 0.2  0.3  0.3   Alkaline Phos 38 - 126 U/L 65  71  71   AST 15 - 41 U/L 13  14  14    ALT 0 - 44 U/L 11  11  12     ASSESSMENT and THERAPY PLAN:  Tracy [name] is a 77 y.o. female who presents for follow-up of multiple myeloma.   1)  IgG Kappa Multiple myeloma with bone lesions, anemia, renal insufficiency: -Baseline M spike was measuring 3.7g/dl at diagnosis. -Cytogenetics showed 1p deletion, polymorphic variant, 13q deletion -Currently on monthly daratumumab  2) h/o DM2 3) Diabetic Neuropathy 4) CKD - likely from DM2, but could have an element of myeloma kidney. 5) h/o TIA and AMI 6) Iron deficiency 7) B12 deficiency   PLAN: -Labs from today reviewed and require no intervention. Stable anemia with Hgb 10.4. No other cytopenias. Stable creatinine level of 1.21. SPEP pending today. -Most recent SPEP/IFE form 01/03/2023 showed M protein measuring 0.1 g/dL -No prohibitive toxicities identified.  -she will continue monthly daratumumab and B12 injections, and pamidronate every 8 weeks -patient will discuss  alternative to gabapentin since it is causing more sedation in the morning.  -sent zofran 8 mg q 8 hours for recurrent post prandial nausea.    Follow-up: Per integrated scheduling  All of the patient's questions were answered with apparent satisfaction. The patient knows to call the clinic with any problems, questions or concerns.  [name] have spent a total of 30 minutes minutes of face-to-face and non-face-to-face time, preparing to see the patient, performing a medically appropriate examination, counseling and educating the patient, documenting clinical information in the electronic health record, and care coordination.   Georga Kaufmann PA-C Dept of Hematology and Oncology [facility] Phone: [phone]

## 2023-02-03 ENCOUNTER — Ambulatory Visit: Payer: Medicare Other | Admitting: Family Medicine

## 2023-02-05 LAB — MULTIPLE MYELOMA PANEL, SERUM
Albumin SerPl Elph-Mcnc: 3.5 g/dL (ref 2.9–4.4)
Albumin/Glob SerPl: 1.4 (ref 0.7–1.7)
Alpha 1: 0.2 g/dL (ref 0.0–0.4)
Alpha2 Glob SerPl Elph-Mcnc: 1.1 g/dL — ABNORMAL HIGH (ref 0.4–1.0)
B-Globulin SerPl Elph-Mcnc: 0.8 g/dL (ref 0.7–1.3)
Gamma Glob SerPl Elph-Mcnc: 0.4 g/dL (ref 0.4–1.8)
Globulin, Total: 2.6 g/dL (ref 2.2–3.9)
IgA: 66 mg/dL (ref 64–422)
IgG (Immunoglobin G), Serum: 451 mg/dL — ABNORMAL LOW (ref 586–1602)
IgM (Immunoglobulin M), Srm: 42 mg/dL (ref 26–217)
M Protein SerPl Elph-Mcnc: 0.2 g/dL — ABNORMAL HIGH
Total Protein ELP: 6.1 g/dL (ref 6.0–8.5)

## 2023-02-10 ENCOUNTER — Ambulatory Visit: Payer: Medicare Other | Admitting: Family Medicine

## 2023-02-13 ENCOUNTER — Ambulatory Visit: Payer: Medicare Other | Admitting: Family Medicine

## 2023-02-13 ENCOUNTER — Encounter: Payer: Self-pay | Admitting: Family Medicine

## 2023-02-13 VITALS — BP 128/84 | HR 73 | Temp 98.1°F | Resp 18 | Ht 60.0 in | Wt 186.2 lb

## 2023-02-13 DIAGNOSIS — S334XXA Traumatic rupture of symphysis pubis, initial encounter: Secondary | ICD-10-CM

## 2023-02-13 DIAGNOSIS — E1140 Type 2 diabetes mellitus with diabetic neuropathy, unspecified: Secondary | ICD-10-CM | POA: Diagnosis not present

## 2023-02-13 MED ORDER — METFORMIN HCL 500 MG PO TABS
ORAL_TABLET | ORAL | 3 refills | Status: DC
Start: 2023-02-13 — End: 2023-12-23

## 2023-02-13 MED ORDER — MELOXICAM 7.5 MG PO TABS: 7.5000 mg | ORAL_TABLET | Freq: Every day | ORAL | 3 refills | Status: DC

## 2023-02-13 NOTE — Patient Instructions (Signed)
Follow up as needed or as scheduled Your CT scan shows pubic symphisitis- inflammation of where the pelvis comes together START the once daily Meloxicam- take w/ food Use ice or heat on your pelvis to help w/ pain Call with any questions or concerns (When you call, it's ok to ask for [facility] specifically so you know who you are talking to) [place] in there!

## 2023-02-13 NOTE — Progress Notes (Unsigned)
   Subjective:    Patient ID: Tracy [name], female    DOB: 04-16-1946, 77 y.o.   MRN: [id number]  HPI Groin pain- pt reports pain starts in the groin and radiates up through R flank and around back.  CT scan done on 7/1 showed no stones or bladder issues but did show chronic pubic symphysitis.  Pt is currently on Gabapentin.   Review of Systems For ROS see HPI     Objective:   Physical Exam Vitals reviewed.  Constitutional:      General: She is not in acute distress.    Appearance: Normal appearance. She is not ill-appearing.  HENT:     Head: Normocephalic and atraumatic.  Genitourinary:    Comments: TTP over pubic symphysis Skin:    General: Skin is warm and dry.  Neurological:     Mental Status: She is alert and oriented to person, place, and time.  Psychiatric:        Mood and Affect: Mood normal.        Behavior: Behavior normal.        Thought Content: Thought content normal.           Assessment & Plan:   Pubic symphysitis- new.  Pt has been complaining of groin pain that radiates upward.  Since there was no improvement in treatment of leuks in urine, we proceeded w/ a CT scan.  CT scan was unremarkable w/ exception of chronic pubic symphysitis.  Will start daily antiinflammatory to improve pain.  Will avoid prednisone due to allergy (and diabetes).  Pt expressed understanding and is in agreement w/ plan.

## 2023-02-24 ENCOUNTER — Telehealth: Payer: Self-pay | Admitting: Family Medicine

## 2023-02-24 NOTE — Telephone Encounter (Signed)
I have spoke to the pt and informed her to hold the medication for 2 weeks and if the bumps stop appearing then we know it is the medication if they continue she will need an apt . Pt expressed verbal understanding

## 2023-02-24 NOTE — Telephone Encounter (Signed)
She can hold the medication for the next 2 days and see if the bumps stop appearing.  If they stop, then we know it's the medication.  If they continue to appear, we know it's not the medication and she will need an appt to be evaluated

## 2023-02-24 NOTE — Telephone Encounter (Signed)
Caller name: KATRICIA SADLON  On DPR?: Yes  Call back number: [phone] (home)  Provider they see: [name], MD  Reason for call: Pt thinks she's having a reaction to the Meloxicam (bumps with heads forming itching meloxicam (MOBIC) 7.5 MG tablet advise

## 2023-02-24 NOTE — Telephone Encounter (Signed)
Pt states she has been taking the Meloxicam and now she has bumps with little heads . They itch and when she scratches it the head comes off and the itching stops . Should pt stop the medication ? Denies any rash

## 2023-02-26 ENCOUNTER — Other Ambulatory Visit: Payer: Self-pay

## 2023-02-27 ENCOUNTER — Other Ambulatory Visit: Payer: Self-pay

## 2023-02-28 ENCOUNTER — Inpatient Hospital Stay: Payer: Medicare Other

## 2023-02-28 ENCOUNTER — Other Ambulatory Visit: Payer: Self-pay

## 2023-02-28 ENCOUNTER — Other Ambulatory Visit: Payer: Medicare Other

## 2023-02-28 ENCOUNTER — Inpatient Hospital Stay: Payer: Medicare Other | Attending: Hematology

## 2023-02-28 ENCOUNTER — Ambulatory Visit: Payer: Medicare Other

## 2023-02-28 VITALS — BP 159/79 | HR 87 | Temp 98.5°F | Resp 21 | Wt 189.0 lb

## 2023-02-28 DIAGNOSIS — Z801 Family history of malignant neoplasm of trachea, bronchus and lung: Secondary | ICD-10-CM | POA: Insufficient documentation

## 2023-02-28 DIAGNOSIS — G2581 Restless legs syndrome: Secondary | ICD-10-CM | POA: Diagnosis not present

## 2023-02-28 DIAGNOSIS — Z90710 Acquired absence of both cervix and uterus: Secondary | ICD-10-CM | POA: Insufficient documentation

## 2023-02-28 DIAGNOSIS — Z5112 Encounter for antineoplastic immunotherapy: Secondary | ICD-10-CM | POA: Diagnosis not present

## 2023-02-28 DIAGNOSIS — Z923 Personal history of irradiation: Secondary | ICD-10-CM | POA: Diagnosis not present

## 2023-02-28 DIAGNOSIS — Z79899 Other long term (current) drug therapy: Secondary | ICD-10-CM | POA: Diagnosis not present

## 2023-02-28 DIAGNOSIS — Z95828 Presence of other vascular implants and grafts: Secondary | ICD-10-CM

## 2023-02-28 DIAGNOSIS — I252 Old myocardial infarction: Secondary | ICD-10-CM | POA: Insufficient documentation

## 2023-02-28 DIAGNOSIS — E1140 Type 2 diabetes mellitus with diabetic neuropathy, unspecified: Secondary | ICD-10-CM | POA: Insufficient documentation

## 2023-02-28 DIAGNOSIS — D509 Iron deficiency anemia, unspecified: Secondary | ICD-10-CM | POA: Insufficient documentation

## 2023-02-28 DIAGNOSIS — Z800 Family history of malignant neoplasm of digestive organs: Secondary | ICD-10-CM | POA: Diagnosis not present

## 2023-02-28 DIAGNOSIS — I2510 Atherosclerotic heart disease of native coronary artery without angina pectoris: Secondary | ICD-10-CM | POA: Insufficient documentation

## 2023-02-28 DIAGNOSIS — Z803 Family history of malignant neoplasm of breast: Secondary | ICD-10-CM | POA: Diagnosis not present

## 2023-02-28 DIAGNOSIS — Z7189 Other specified counseling: Secondary | ICD-10-CM

## 2023-02-28 DIAGNOSIS — I129 Hypertensive chronic kidney disease with stage 1 through stage 4 chronic kidney disease, or unspecified chronic kidney disease: Secondary | ICD-10-CM | POA: Diagnosis not present

## 2023-02-28 DIAGNOSIS — E1122 Type 2 diabetes mellitus with diabetic chronic kidney disease: Secondary | ICD-10-CM | POA: Insufficient documentation

## 2023-02-28 DIAGNOSIS — Z8673 Personal history of transient ischemic attack (TIA), and cerebral infarction without residual deficits: Secondary | ICD-10-CM | POA: Diagnosis not present

## 2023-02-28 DIAGNOSIS — G4733 Obstructive sleep apnea (adult) (pediatric): Secondary | ICD-10-CM | POA: Insufficient documentation

## 2023-02-28 DIAGNOSIS — C9000 Multiple myeloma not having achieved remission: Secondary | ICD-10-CM

## 2023-02-28 DIAGNOSIS — Z87891 Personal history of nicotine dependence: Secondary | ICD-10-CM | POA: Insufficient documentation

## 2023-02-28 DIAGNOSIS — Z853 Personal history of malignant neoplasm of breast: Secondary | ICD-10-CM | POA: Insufficient documentation

## 2023-02-28 DIAGNOSIS — R110 Nausea: Secondary | ICD-10-CM | POA: Insufficient documentation

## 2023-02-28 DIAGNOSIS — E538 Deficiency of other specified B group vitamins: Secondary | ICD-10-CM | POA: Insufficient documentation

## 2023-02-28 DIAGNOSIS — C7951 Secondary malignant neoplasm of bone: Secondary | ICD-10-CM

## 2023-02-28 DIAGNOSIS — G629 Polyneuropathy, unspecified: Secondary | ICD-10-CM | POA: Insufficient documentation

## 2023-02-28 DIAGNOSIS — N189 Chronic kidney disease, unspecified: Secondary | ICD-10-CM | POA: Diagnosis not present

## 2023-02-28 LAB — CBC WITH DIFFERENTIAL (CANCER CENTER ONLY)
Abs Immature Granulocytes: 0.01 10*3/uL (ref 0.00–0.07)
Basophils Absolute: 0 10*3/uL (ref 0.0–0.1)
Basophils Relative: 1 %
Eosinophils Absolute: 0.1 10*3/uL (ref 0.0–0.5)
Eosinophils Relative: 1 %
HCT: 31 % — ABNORMAL LOW (ref 36.0–46.0)
Hemoglobin: 10.6 g/dL — ABNORMAL LOW (ref 12.0–15.0)
Immature Granulocytes: 0 %
Lymphocytes Relative: 43 %
Lymphs Abs: 1.9 10*3/uL (ref 0.7–4.0)
MCH: 32 pg (ref 26.0–34.0)
MCHC: 34.2 g/dL (ref 30.0–36.0)
MCV: 93.7 fL (ref 80.0–100.0)
Monocytes Absolute: 0.3 10*3/uL (ref 0.1–1.0)
Monocytes Relative: 7 %
Neutro Abs: 2.1 10*3/uL (ref 1.7–7.7)
Neutrophils Relative %: 48 %
Platelet Count: 265 10*3/uL (ref 150–400)
RBC: 3.31 MIL/uL — ABNORMAL LOW (ref 3.87–5.11)
RDW: 13.5 % (ref 11.5–15.5)
WBC Count: 4.5 10*3/uL (ref 4.0–10.5)
nRBC: 0 % (ref 0.0–0.2)

## 2023-02-28 LAB — CMP (CANCER CENTER ONLY)
ALT: 11 U/L (ref 0–44)
AST: 14 U/L — ABNORMAL LOW (ref 15–41)
Albumin: 4.1 g/dL (ref 3.5–5.0)
Alkaline Phosphatase: 66 U/L (ref 38–126)
Anion gap: 10 (ref 5–15)
BUN: 9 mg/dL (ref 8–23)
CO2: 24 mmol/L (ref 22–32)
Calcium: 8.6 mg/dL — ABNORMAL LOW (ref 8.9–10.3)
Chloride: 110 mmol/L (ref 98–111)
Creatinine: 1.22 mg/dL — ABNORMAL HIGH (ref 0.44–1.00)
GFR, Estimated: 45 mL/min — ABNORMAL LOW (ref >60)
Glucose, Bld: 116 mg/dL — ABNORMAL HIGH (ref 70–99)
Potassium: 4 mmol/L (ref 3.5–5.1)
Sodium: 144 mmol/L (ref 135–145)
Total Bilirubin: 0.3 mg/dL (ref 0.3–1.2)
Total Protein: 6.3 g/dL — ABNORMAL LOW (ref 6.5–8.1)

## 2023-02-28 MED ORDER — SODIUM CHLORIDE 0.9% FLUSH
10.0000 mL | INTRAVENOUS | Status: DC | PRN
  Administered 2023-02-28: 10 mL

## 2023-02-28 MED ORDER — SODIUM CHLORIDE 0.9 % IV SOLN: Freq: Once | INTRAVENOUS | Status: AC

## 2023-02-28 MED ORDER — DIPHENHYDRAMINE HCL 25 MG PO CAPS
50.0000 mg | ORAL_CAPSULE | Freq: Once | ORAL | Status: AC
  Administered 2023-02-28: 50 mg via ORAL
  Filled 2023-02-28: qty 2

## 2023-02-28 MED ORDER — SODIUM CHLORIDE 0.9% FLUSH
10.0000 mL | Freq: Once | INTRAVENOUS | Status: AC
  Administered 2023-02-28: 10 mL

## 2023-02-28 MED ORDER — SODIUM CHLORIDE 0.9 % IV SOLN
16.0000 mg/kg | Freq: Once | INTRAVENOUS | Status: AC
  Administered 2023-02-28: 1300 mg via INTRAVENOUS
  Filled 2023-02-28: qty 5

## 2023-02-28 MED ORDER — SODIUM CHLORIDE 0.9 % IV SOLN
16.0000 mg | Freq: Once | INTRAVENOUS | Status: AC
  Administered 2023-02-28: 16 mg via INTRAVENOUS
  Filled 2023-02-28: qty 1.6

## 2023-02-28 MED ORDER — HEPARIN SOD (PORK) LOCK FLUSH 100 UNIT/ML IV SOLN
500.0000 [IU] | Freq: Once | INTRAVENOUS | Status: AC | PRN
  Administered 2023-02-28: 500 [IU]

## 2023-02-28 MED ORDER — FAMOTIDINE IN NACL 20-0.9 MG/50ML-% IV SOLN
20.0000 mg | Freq: Once | INTRAVENOUS | Status: AC
  Administered 2023-02-28: 20 mg via INTRAVENOUS
  Filled 2023-02-28: qty 50

## 2023-02-28 MED ORDER — CYANOCOBALAMIN 1000 MCG/ML IJ SOLN
1000.0000 ug | Freq: Once | INTRAMUSCULAR | Status: AC
  Administered 2023-02-28: 1000 ug via INTRAMUSCULAR
  Filled 2023-02-28: qty 1

## 2023-02-28 MED ORDER — ACETAMINOPHEN 325 MG PO TABS
650.0000 mg | ORAL_TABLET | Freq: Once | ORAL | Status: AC
  Administered 2023-02-28: 650 mg via ORAL
  Filled 2023-02-28: qty 2

## 2023-02-28 NOTE — Progress Notes (Signed)
Pt reported to infusion today with c/o new onset chest pressure/pain and a tight feeling around her upper abd/below Pt's breasts. Pt stated "I feel like I have an elastic band around me that is getting tighter and tighter". Pt informed this RN that this feeling has been present for approximately a week and it varies in sharpness and duration. This RN made Earlie Counts aware.  Benedict Needy in [facility] assessed Pt chairside. Pt refused to go to ED for further evaluation and was educated to report to ED if s/s worsen. Jae Dire PA-C recommended Pt to follow up with PCP and cardiologist. Pt verbalized understanding and was agreeable with information provided.  Pt completed tx today without incident.

## 2023-02-28 NOTE — Patient Instructions (Signed)
[facility]  Discharge Instructions: Thank you for choosing [facility] to provide your oncology and hematology care.   If you have a lab appointment with the Cancer Center, please go directly to the Cancer Center and check in at the registration area.   Wear comfortable clothing and clothing appropriate for easy access to any Portacath or PICC line.   We strive to give you quality time with your provider. You may need to reschedule your appointment if you arrive late (15 or more minutes).  Arriving late affects you and other patients whose appointments are after yours.  Also, if you miss three or more appointments without notifying the office, you may be dismissed from the clinic at the provider's discretion.      For prescription refill requests, have your pharmacy contact our office and allow 72 hours for refills to be completed.    Today you received the following chemotherapy and/or immunotherapy agents: Darzalex    To help prevent nausea and vomiting after your treatment, we encourage you to take your nausea medication as directed.  BELOW ARE SYMPTOMS THAT SHOULD BE REPORTED IMMEDIATELY: *FEVER GREATER THAN 100.4 F (38 C) OR HIGHER *CHILLS OR SWEATING *NAUSEA AND VOMITING THAT IS NOT CONTROLLED WITH YOUR NAUSEA MEDICATION *UNUSUAL SHORTNESS OF BREATH *UNUSUAL BRUISING OR BLEEDING *URINARY PROBLEMS (pain or burning when urinating, or frequent urination) *BOWEL PROBLEMS (unusual diarrhea, constipation, pain near the anus) TENDERNESS IN MOUTH AND THROAT WITH OR WITHOUT PRESENCE OF ULCERS (sore throat, sores in mouth, or a toothache) UNUSUAL RASH, SWELLING OR PAIN  UNUSUAL VAGINAL DISCHARGE OR ITCHING   Items with * indicate a potential emergency and should be followed up as soon as possible or go to the Emergency Department if any problems should occur.  Please show the CHEMOTHERAPY ALERT CARD or IMMUNOTHERAPY ALERT CARD at check-in  to the Emergency Department and triage nurse.  Should you have questions after your visit or need to cancel or reschedule your appointment, please contact [facility]  Dept: [phone]  and follow the prompts.  Office hours are 8:00 a.m. to 4:30 p.m. Monday - Friday. Please note that voicemails left after 4:00 p.m. may not be returned until the following business day.  We are closed weekends and major holidays. You have access to a nurse at all times for urgent questions. Please call the main number to the clinic Dept: [phone] and follow the prompts.   For any non-urgent questions, you may also contact your provider using MyChart. We now offer e-Visits for anyone 18 and older to request care online for non-urgent symptoms. For details visit [email or web address].   Also download the MyChart app! Go to the app store, search "MyChart", open the app, select [facility], and log in with your MyChart username and password.   

## 2023-03-06 ENCOUNTER — Other Ambulatory Visit: Payer: Self-pay

## 2023-03-06 DIAGNOSIS — C9000 Multiple myeloma not having achieved remission: Secondary | ICD-10-CM

## 2023-03-07 ENCOUNTER — Other Ambulatory Visit: Payer: Self-pay

## 2023-03-07 ENCOUNTER — Inpatient Hospital Stay: Payer: Medicare Other

## 2023-03-07 VITALS — BP 155/62 | HR 78 | Temp 98.1°F | Resp 18

## 2023-03-07 DIAGNOSIS — Z923 Personal history of irradiation: Secondary | ICD-10-CM | POA: Diagnosis not present

## 2023-03-07 DIAGNOSIS — I2510 Atherosclerotic heart disease of native coronary artery without angina pectoris: Secondary | ICD-10-CM | POA: Diagnosis not present

## 2023-03-07 DIAGNOSIS — Z87891 Personal history of nicotine dependence: Secondary | ICD-10-CM | POA: Diagnosis not present

## 2023-03-07 DIAGNOSIS — Z8673 Personal history of transient ischemic attack (TIA), and cerebral infarction without residual deficits: Secondary | ICD-10-CM | POA: Diagnosis not present

## 2023-03-07 DIAGNOSIS — Z800 Family history of malignant neoplasm of digestive organs: Secondary | ICD-10-CM | POA: Diagnosis not present

## 2023-03-07 DIAGNOSIS — Z801 Family history of malignant neoplasm of trachea, bronchus and lung: Secondary | ICD-10-CM | POA: Diagnosis not present

## 2023-03-07 DIAGNOSIS — C9000 Multiple myeloma not having achieved remission: Secondary | ICD-10-CM

## 2023-03-07 DIAGNOSIS — E1140 Type 2 diabetes mellitus with diabetic neuropathy, unspecified: Secondary | ICD-10-CM | POA: Diagnosis not present

## 2023-03-07 DIAGNOSIS — Z803 Family history of malignant neoplasm of breast: Secondary | ICD-10-CM | POA: Diagnosis not present

## 2023-03-07 DIAGNOSIS — R110 Nausea: Secondary | ICD-10-CM | POA: Diagnosis not present

## 2023-03-07 DIAGNOSIS — Z95828 Presence of other vascular implants and grafts: Secondary | ICD-10-CM

## 2023-03-07 DIAGNOSIS — G2581 Restless legs syndrome: Secondary | ICD-10-CM | POA: Diagnosis not present

## 2023-03-07 DIAGNOSIS — G629 Polyneuropathy, unspecified: Secondary | ICD-10-CM | POA: Diagnosis not present

## 2023-03-07 DIAGNOSIS — E1122 Type 2 diabetes mellitus with diabetic chronic kidney disease: Secondary | ICD-10-CM | POA: Diagnosis not present

## 2023-03-07 DIAGNOSIS — I129 Hypertensive chronic kidney disease with stage 1 through stage 4 chronic kidney disease, or unspecified chronic kidney disease: Secondary | ICD-10-CM | POA: Diagnosis not present

## 2023-03-07 DIAGNOSIS — Z853 Personal history of malignant neoplasm of breast: Secondary | ICD-10-CM | POA: Diagnosis not present

## 2023-03-07 DIAGNOSIS — I252 Old myocardial infarction: Secondary | ICD-10-CM | POA: Diagnosis not present

## 2023-03-07 DIAGNOSIS — Z7189 Other specified counseling: Secondary | ICD-10-CM

## 2023-03-07 DIAGNOSIS — E538 Deficiency of other specified B group vitamins: Secondary | ICD-10-CM | POA: Diagnosis not present

## 2023-03-07 DIAGNOSIS — D509 Iron deficiency anemia, unspecified: Secondary | ICD-10-CM | POA: Diagnosis not present

## 2023-03-07 DIAGNOSIS — N189 Chronic kidney disease, unspecified: Secondary | ICD-10-CM | POA: Diagnosis not present

## 2023-03-07 DIAGNOSIS — Z79899 Other long term (current) drug therapy: Secondary | ICD-10-CM | POA: Diagnosis not present

## 2023-03-07 DIAGNOSIS — G4733 Obstructive sleep apnea (adult) (pediatric): Secondary | ICD-10-CM | POA: Diagnosis not present

## 2023-03-07 DIAGNOSIS — C7951 Secondary malignant neoplasm of bone: Secondary | ICD-10-CM

## 2023-03-07 DIAGNOSIS — Z5112 Encounter for antineoplastic immunotherapy: Secondary | ICD-10-CM | POA: Diagnosis not present

## 2023-03-07 LAB — CBC WITH DIFFERENTIAL (CANCER CENTER ONLY)
Abs Immature Granulocytes: 0.02 10*3/uL (ref 0.00–0.07)
Basophils Absolute: 0 10*3/uL (ref 0.0–0.1)
Basophils Relative: 1 %
Eosinophils Absolute: 0.1 10*3/uL (ref 0.0–0.5)
Eosinophils Relative: 1 %
HCT: 30.8 % — ABNORMAL LOW (ref 36.0–46.0)
Hemoglobin: 10.3 g/dL — ABNORMAL LOW (ref 12.0–15.0)
Immature Granulocytes: 0 %
Lymphocytes Relative: 40 %
Lymphs Abs: 2.1 10*3/uL (ref 0.7–4.0)
MCH: 30.9 pg (ref 26.0–34.0)
MCHC: 33.4 g/dL (ref 30.0–36.0)
MCV: 92.5 fL (ref 80.0–100.0)
Monocytes Absolute: 0.4 10*3/uL (ref 0.1–1.0)
Monocytes Relative: 7 %
Neutro Abs: 2.7 10*3/uL (ref 1.7–7.7)
Neutrophils Relative %: 51 %
Platelet Count: 258 10*3/uL (ref 150–400)
RBC: 3.33 MIL/uL — ABNORMAL LOW (ref 3.87–5.11)
RDW: 13.6 % (ref 11.5–15.5)
WBC Count: 5.2 10*3/uL (ref 4.0–10.5)
nRBC: 0 % (ref 0.0–0.2)

## 2023-03-07 LAB — CMP (CANCER CENTER ONLY)
ALT: 9 U/L (ref 0–44)
AST: 12 U/L — ABNORMAL LOW (ref 15–41)
Albumin: 4.1 g/dL (ref 3.5–5.0)
Alkaline Phosphatase: 61 U/L (ref 38–126)
Anion gap: 11 (ref 5–15)
BUN: 12 mg/dL (ref 8–23)
CO2: 24 mmol/L (ref 22–32)
Calcium: 8.7 mg/dL — ABNORMAL LOW (ref 8.9–10.3)
Chloride: 107 mmol/L (ref 98–111)
Creatinine: 1.17 mg/dL — ABNORMAL HIGH (ref 0.44–1.00)
GFR, Estimated: 48 mL/min — ABNORMAL LOW (ref >60)
Glucose, Bld: 118 mg/dL — ABNORMAL HIGH (ref 70–99)
Potassium: 3.8 mmol/L (ref 3.5–5.1)
Sodium: 142 mmol/L (ref 135–145)
Total Bilirubin: 0.3 mg/dL (ref 0.3–1.2)
Total Protein: 6.2 g/dL — ABNORMAL LOW (ref 6.5–8.1)

## 2023-03-07 MED ORDER — SODIUM CHLORIDE 0.9% FLUSH
10.0000 mL | Freq: Once | INTRAVENOUS | Status: AC
  Administered 2023-03-07: 10 mL

## 2023-03-07 MED ORDER — SODIUM CHLORIDE 0.9 % IV SOLN
60.0000 mg | Freq: Once | INTRAVENOUS | Status: DC
  Filled 2023-03-07: qty 10

## 2023-03-07 MED ORDER — SODIUM CHLORIDE 0.9 % IV SOLN
60.0000 mg | Freq: Once | INTRAVENOUS | Status: AC
  Administered 2023-03-07: 60 mg via INTRAVENOUS
  Filled 2023-03-07: qty 20

## 2023-03-07 NOTE — Patient Instructions (Signed)
Pamidronate Injection What is this medication? PAMIDRONATE (pa mi DROE nate) treats high calcium levels in the blood caused by cancer. It may also be used with chemotherapy to treat weakened bones caused by cancer. It can also be used to treat Paget's disease of the bone. It works by slowing down the release of calcium from bones. This lowers calcium levels in your blood. It also makes your bones stronger and less likely to break (fracture). It belongs to a group of medications called bisphosphonates. This medicine may be used for other purposes; ask your health care provider or pharmacist if you have questions. COMMON BRAND NAME(S): Aredia What should I tell my care team before I take this medication? They need to know if you have any of these conditions: Bleeding disorder Cancer Dental disease Kidney disease Low levels of calcium or other minerals in the blood Low red blood cell counts Receiving steroids, such as dexamethasone or prednisone An unusual or allergic reaction to pamidronate, other medications, foods, dyes or preservatives Pregnant or trying to get pregnant Breast-feeding How should I use this medication? This medication is injected into a vein. It is given by your care team in a hospital or clinic setting. Talk to your care team about the use of this medication in children. Special care may be needed. Overdosage: If you think you have taken too much of this medicine contact a poison control center or emergency room at once. NOTE: This medicine is only for you. Do not share this medicine with others. What if I miss a dose? Keep appointments for follow-up doses. It is important not to miss your dose. Call your care team if you are unable to keep an appointment. What may interact with this medication? Certain antibiotics given by injection Medications for inflammation or pain, such as ibuprofen, naproxen Some diuretics, such as bumetanide, furosemide Cyclosporine Parathyroid  hormone Tacrolimus Teriparatide Thalidomide This list may not describe all possible interactions. Give your health care provider a list of all the medicines, herbs, non-prescription drugs, or dietary supplements you use. Also tell them if you smoke, drink alcohol, or use illegal drugs. Some items may interact with your medicine. What should I watch for while using this medication? Visit your care team for regular checks on your progress. It may be some time before you see the benefit from this medication. Some people who take this medication have severe bone, joint, or muscle pain. This medication may also increase your risk for jaw problems or a broken thigh bone. Tell your care team right away if you have severe pain in your jaw, bones, joints, or muscles. Tell your care team if you have any pain that does not go away or that gets worse. Tell your dentist and dental surgeon that you are taking this medication. You should not have major dental surgery while on this medication. See your dentist to have a dental exam and fix any dental problems before starting this medication. Take good care of your teeth while on this medication. Make sure you see your dentist for regular follow-up appointments. You should make sure you get enough calcium and vitamin D while you are taking this medication. Discuss the foods you eat and the vitamins you take with your care team. You may need bloodwork while you are taking this medication. Talk to your care team if you wish to become pregnant or think you might be pregnant. This medication can cause serious birth defects. What side effects may I notice from receiving  this medication? Side effects that you should report to your care team as soon as possible: Allergic reactions--skin rash, itching, hives, swelling of the face, lips, tongue, or throat Kidney injury--decrease in the amount of urine, swelling of the ankles, hands, or feet Low calcium level--muscle pain or  cramps, confusion, tingling, or numbness in the hands or feet Osteonecrosis of the jaw--pain, swelling, or redness in the mouth, numbness of the jaw, poor healing after dental work, unusual discharge from the mouth, visible bones in the mouth Severe bone, joint, or muscle pain Side effects that usually do not require medical attention (report to your care team if they continue or are bothersome): Constipation Fatigue Fever Loss of appetite Nausea Pain, redness, or irritation at injection site Stomach pain This list may not describe all possible side effects. Call your doctor for medical advice about side effects. You may report side effects to FDA at 1-800-FDA-1088. Where should I keep my medication? This medication is given in a hospital or clinic. It will not be stored at home. NOTE: This sheet is a summary. It may not cover all possible information. If you have questions about this medicine, talk to your doctor, pharmacist, or health care provider.  2024 Elsevier/Gold Standard (2021-09-03 00:00:00)

## 2023-03-07 NOTE — Progress Notes (Signed)
Ok to give Aredia today with Calcium level of 8.7 Per Dr. Candise Che

## 2023-03-16 ENCOUNTER — Encounter (HOSPITAL_COMMUNITY): Payer: Self-pay

## 2023-03-16 ENCOUNTER — Emergency Department (HOSPITAL_COMMUNITY): Payer: Medicare Other

## 2023-03-16 ENCOUNTER — Emergency Department (HOSPITAL_COMMUNITY)
Admission: EM | Admit: 2023-03-16 | Discharge: 2023-03-16 | Disposition: A | Discharge status: Home / Self Care | Payer: Medicare Other | Attending: Emergency Medicine | Admitting: Emergency Medicine

## 2023-03-16 DIAGNOSIS — Z452 Encounter for adjustment and management of vascular access device: Secondary | ICD-10-CM | POA: Diagnosis not present

## 2023-03-16 DIAGNOSIS — Z91040 Latex allergy status: Secondary | ICD-10-CM | POA: Diagnosis not present

## 2023-03-16 DIAGNOSIS — M542 Cervicalgia: Secondary | ICD-10-CM | POA: Diagnosis not present

## 2023-03-16 DIAGNOSIS — M25511 Pain in right shoulder: Secondary | ICD-10-CM | POA: Insufficient documentation

## 2023-03-16 DIAGNOSIS — M19011 Primary osteoarthritis, right shoulder: Secondary | ICD-10-CM | POA: Diagnosis not present

## 2023-03-16 MED ORDER — ACETAMINOPHEN 500 MG PO TABS: 1000.0000 mg | ORAL_TABLET | Freq: Once | ORAL | Status: DC

## 2023-03-16 MED ORDER — HYDROCODONE-ACETAMINOPHEN 5-325 MG PO TABS: 1.0000 | ORAL_TABLET | Freq: Four times a day (QID) | ORAL | 0 refills | Status: DC | PRN

## 2023-03-16 NOTE — ED Notes (Signed)
Pt d/c with instructions. Verbalized understanding. NAD. Stable condition

## 2023-03-16 NOTE — ED Notes (Signed)
Pt d/c with instructions. Verbalized understanding. All questions answered

## 2023-03-16 NOTE — ED Triage Notes (Signed)
Pt arrived via POV, c/o several painful lumps to right sided neck and shoulder/upper back. Worsening pain with palpation.

## 2023-03-16 NOTE — Discharge Instructions (Signed)
Return for any problem.  ?

## 2023-03-16 NOTE — ED Provider Notes (Signed)
[place] EMERGENCY DEPARTMENT AT [facility] Provider Note   CSN: [id number] Arrival date & time: 03/16/23  1405     History  Chief Complaint  Patient presents with   Neck Pain    Tracy [name] is a 77 y.o. female.  77 year old female with prior medical history as detailed below presents for evaluation. She complains of posterior right shoulder pain. Onset three days prior. Symptoms worse with movement. No fever. No numbness/weakness/swelling/edema to RUE.   She cannot recall a specific inciting event.   Port in place in right upper chest wall - no surrounding pain, swelling, redness. No difficulty accessing port (last week).    The history is provided by the patient and medical records.       Home Medications Prior to Admission medications   Medication Sig Start Date End Date Taking? Authorizing Provider  acyclovir (ZOVIRAX) 400 MG tablet TAKE 1 TABLET BY MOUTH TWICE  DAILY 12/02/22   Johney Maine, MD  atorvastatin (LIPITOR) 10 MG tablet TAKE 1 TABLET BY MOUTH DAILY 01/27/23   [name], MD  azelastine (ASTELIN) 0.1 % nasal spray Place 1 spray into both nostrils 2 (two) times daily. Use in each nostril as directed 05/29/21   Janeece Agee, NP  gabapentin (NEURONTIN) 300 MG capsule Take 2 capsules (600 mg total) by mouth 3 (three) times daily. 01/31/23 04/25/24  [name], MD  glucose blood (ACCU-CHEK GUIDE) test strip Use as instructed to check sugars 1-2 times daily. 02/10/20   [name], MD  lidocaine (LIDODERM) 5 % Place 1 patch onto the skin daily. Remove & Discard patch within 12 hours or as directed by MD 01/16/23   [name], MD  lidocaine-prilocaine (EMLA) cream Apply 1 Application topically as needed. 09/13/22   Johney Maine, MD  loratadine (CLARITIN) 10 MG tablet Take 10 mg by mouth daily.    [provider]  meclizine (ANTIVERT) 25 MG tablet Take 1 tablet (25 mg total) by mouth 3 (three) times  daily as needed for dizziness. 03/08/21   Loa Socks, NP  meloxicam (MOBIC) 7.5 MG tablet Take 1 tablet (7.5 mg total) by mouth daily. 02/13/23   [name], MD  metFORMIN (GLUCOPHAGE) 500 MG tablet TAKE 1 TABLET BY MOUTH  TWICE DAILY WITH A MEAL 02/13/23   [name], MD  ondansetron (ZOFRAN) 8 MG tablet Take 1 tablet (8 mg total) by mouth every 8 (eight) hours as needed for nausea or vomiting. 01/31/23   Briant Cedar, PA-C  pantoprazole (PROTONIX) 40 MG tablet TAKE 1 TABLET BY MOUTH TWICE  DAILY 10/28/22   [name], MD  tiZANidine (ZANAFLEX) 4 MG tablet TAKE 1 TABLET BY MOUTH AT  BEDTIME 04/10/22   [name], MD  traZODone (DESYREL) 100 MG tablet TAKE 1 TABLET BY MOUTH AT  BEDTIME 06/04/22   [name], MD  Vitamin D, Ergocalciferol, (DRISDOL) 1.25 MG (50000 UNIT) CAPS capsule TAKE 1 CAPSULE BY MOUTH ONCE  WEEKLY 11/06/22   Johney Maine, MD      Allergies    Bacitracin-neomycin-polymyxin  [neomycin-bacitracin zn-polymyx], Nsaids, Tape, Ambien [zolpidem tartrate], Amoxicillin, Clavulanic acid, Contrast media [iodinated contrast media], Latex, Prednisone, and Tessalon [benzonatate]    Review of Systems   Review of Systems  All other systems reviewed and are negative.   Physical Exam Updated Vital Signs BP (!) 154/101   Pulse 85   Temp 98.8 F (37.1  C) (Oral)   Resp 18   SpO2 97%  Physical Exam Vitals and nursing note reviewed.  Constitutional:      General: She is not in acute distress.    Appearance: Normal appearance. She is well-developed.  HENT:     Head: Normocephalic and atraumatic.  Eyes:     Conjunctiva/sclera: Conjunctivae normal.     Pupils: Pupils are equal, round, and reactive to light.  Cardiovascular:     Rate and Rhythm: Normal rate and regular rhythm.     Heart sounds: Normal heart sounds.  Pulmonary:     Effort: Pulmonary effort is normal. No respiratory distress.     Breath sounds: Normal breath  sounds.  Abdominal:     General: There is no distension.     Palpations: Abdomen is soft.     Tenderness: There is no abdominal tenderness.  Musculoskeletal:        General: No deformity. Normal range of motion.     Cervical back: Normal range of motion and neck supple.     Comments: Mild TTP of the right posterior shoulder with associated muscle spasm.   Port in place in right upper chest wall. No tenderness at port site, no erythema, no edema.   Skin:    General: Skin is warm and dry.  Neurological:     General: No focal deficit present.     Mental Status: She is alert and oriented to person, place, and time.     ED Results / Procedures / Treatments   Labs (all labs ordered are listed, but only abnormal results are displayed) Labs Reviewed - No data to display  EKG None  Radiology No results found.  Procedures Procedures    Medications Ordered in ED Medications  acetaminophen (TYLENOL) tablet 1,000 mg (has no administration in time range)    ED Course/ Medical Decision Making/ A&P                                 Medical Decision Making Amount and/or Complexity of Data Reviewed Radiology: ordered.  Risk OTC drugs. Prescription drug management.    Medical Screen Complete  This patient presented to the ED with complaint of neck/shoulder pain.  This complaint involves an extensive number of treatment options. The initial differential diagnosis includes, but is not limited to, muscle strain, nerve impingment  This presentation is: Acute, Self-Limited, Previously Undiagnosed, Uncertain Prognosis, Complicated, Systemic Symptoms, and Threat to Life/Bodily Function  Patient with posterior right shoulder pain.   Hx and exam consistent with muscle strain / spasm.  Imaging demonstrates OA of right shoulder - likely contributing to patient's pain.   Patient aggreeable to trial of Norco at home - she has used this previously.  She also has lidoderm patches and  muscle relaxant at home.   Importance of close FU stressed. Strick return precautions given and understood.   Additional history obtained: External records from outside sources obtained and reviewed including prior ED visits and prior Inpatient records.    Problem List / ED Course:  Right shoulder pain   Reevaluation:  After the interventions noted above, I reevaluated the patient and found that they have: improved   Disposition:  After consideration of the diagnostic results and the patients response to treatment, I feel that the patent would benefit from close outpatient followup.          Final Clinical Impression(s) / ED Diagnoses Final  diagnoses:  Acute pain of right shoulder    Rx / DC Orders ED Discharge Orders          Ordered    HYDROcodone-acetaminophen (NORCO/VICODIN) 5-325 MG tablet  Every 6 hours PRN        03/16/23 1700              Wynetta Fines, MD 03/16/23 1721

## 2023-03-18 ENCOUNTER — Other Ambulatory Visit: Payer: Self-pay | Admitting: Family Medicine

## 2023-03-18 MED ORDER — LIDOCAINE 5 % EX PTCH: 1.0000 | MEDICATED_PATCH | CUTANEOUS | 0 refills | Status: DC

## 2023-03-18 NOTE — Telephone Encounter (Signed)
Agree w appt

## 2023-03-18 NOTE — Telephone Encounter (Signed)
Pt needs hospital follow up then we can look at prescribing these

## 2023-03-18 NOTE — Telephone Encounter (Signed)
Pt is aware that Dr Beverely Low sent in the Lidocaine patches

## 2023-03-18 NOTE — Telephone Encounter (Signed)
Caller name: PHOUA BRELSFORD  On DPR?: Yes  Call back number: [phone] (mobile)  Provider they see: [name], MD  Reason for call: Pt was seen at ER on 03/16/23 and was dx'd with osteoarthritis in shoulder. She was prescribed lidocaine patches and has requested that the script be sent to [facility] because it is more expensive getting it filled through [facility].

## 2023-03-18 NOTE — Telephone Encounter (Signed)
Pt has an hospital f/u on Thursday . She is asking if we can renew the Rx for her lidocaine patch that we prescribed her and sent [facility] ?

## 2023-03-19 ENCOUNTER — Telehealth: Payer: Self-pay

## 2023-03-19 NOTE — Transitions of Care (Post Inpatient/ED Visit) (Signed)
03/19/2023  Name: Tracy [name] MRN: [id number] DOB: [birth year]  Today's TOC FU Call Status: Today's TOC FU Call Status:: Successful TOC FU Call Completed TOC FU Call Complete Date: 03/19/23  Red on EMMI-ED Discharge Alert Date & Reason:03/18/23 "Scheduled follow-up appt? No"  Transition Care Management Follow-up Telephone Call Date of Discharge: 03/16/23 Discharge Facility: [facility]) Type of Discharge: Emergency Department Reason for ED Visit: Other: ("acute pain of right shoulder") How have you been since you were released from the hospital?: Same (Pt resting at present-states 'pain is still the same-taking pain meds q6hrs around the clock." She is using Lidoderm patch as well. Appetite fair. No BM since Sunday-she will try drinking apple/prune juice and get OTC stool softener/laxative today) Any questions or concerns?: No  Items Reviewed: Did you receive and understand the discharge instructions provided?: Yes Medications obtained,verified, and reconciled?: Yes (Medications Reviewed) Any new allergies since your discharge?: No Dietary orders reviewed?: Yes Type of Diet Ordered:: low salt/heart healthy/carb modified Do you have support at home?: Yes People in Home: alone Name of Support/Comfort Primary Source: daughter lives nearby-able to assist as needed  Medications Reviewed Today: Medications Reviewed Today     Reviewed by Charlyn Minerva, RN (Registered Nurse) on 03/19/23 at 1112  Med List Status: <None>   Medication Order Taking? Sig Documenting Provider Last Dose Status Informant  acyclovir (ZOVIRAX) 400 MG tablet [id number] Yes TAKE 1 TABLET BY MOUTH TWICE  DAILY Johney Maine, MD Taking Active   atorvastatin (LIPITOR) 10 MG tablet [id number] Yes TAKE 1 TABLET BY MOUTH DAILY [name], MD Taking Active   azelastine (ASTELIN) 0.1 % nasal spray [id number] Yes Place 1 spray into both nostrils 2 (two) times daily. Use in each nostril as  directed Janeece Agee, NP Taking Active   gabapentin (NEURONTIN) 300 MG capsule [id number] Yes Take 2 capsules (600 mg total) by mouth 3 (three) times daily. [name], MD Taking Active   glucose blood (ACCU-CHEK GUIDE) test strip [id number] Yes Use as instructed to check sugars 1-2 times daily. [name], MD Taking Active   HYDROcodone-acetaminophen (NORCO/VICODIN) 5-325 MG tablet [id number] Yes Take 1 tablet by mouth every 6 (six) hours as needed. Wynetta Fines, MD Taking Active   lidocaine (LIDODERM) 5 % [id number] Yes Place 1 patch onto the skin daily. Remove & Discard patch within 12 hours or as directed by MD [name], MD Taking Active   lidocaine-prilocaine (EMLA) cream [id number] Yes Apply 1 Application topically as needed. Johney Maine, MD Taking Active   loratadine (CLARITIN) 10 MG tablet [id number] Yes Take 10 mg by mouth daily. [provider] Taking Active Self  meclizine (ANTIVERT) 25 MG tablet [id number] Yes Take 1 tablet (25 mg total) by mouth 3 (three) times daily as needed for dizziness. Loa Socks, NP Taking Active Self           Med Note [facility]   Wed Aug 22, 2021 11:48 AM) PRN  meloxicam (MOBIC) 7.5 MG tablet [id number] Yes Take 1 tablet (7.5 mg total) by mouth daily. [name], MD Taking Active   metFORMIN (GLUCOPHAGE) 500 MG tablet [id number] Yes TAKE 1 TABLET BY MOUTH  TWICE DAILY WITH A MEAL [name], MD Taking Active   ondansetron [facility]) 8 MG tablet [id number] Yes Take 1 tablet (8 mg total) by mouth every 8 (eight) hours as needed for nausea or vomiting. Briant Cedar, PA-C Taking Active  pantoprazole (PROTONIX) 40 MG tablet [id number] Yes TAKE 1 TABLET BY MOUTH TWICE  DAILY [name], MD Taking Active   tiZANidine (ZANAFLEX) 4 MG tablet [id number] Yes TAKE 1 TABLET BY MOUTH AT  BEDTIME [name], MD Taking Active   traZODone (DESYREL) 100 MG tablet  [id number] Yes TAKE 1 TABLET BY MOUTH AT  BEDTIME [name], MD Taking Active   Vitamin D, Ergocalciferol, (DRISDOL) 1.25 MG (50000 UNIT) CAPS capsule [id number] Yes TAKE 1 CAPSULE BY MOUTH ONCE  WEEKLY Johney Maine, MD Taking Active             Home Care and Equipment/Supplies: Were Home Health Services Ordered?: NA Any new equipment or medical supplies ordered?: NA  Functional Questionnaire: Do you need assistance with bathing/showering or dressing?: No Do you need assistance with meal preparation?: No Do you need assistance with eating?: No Do you have difficulty maintaining continence: No Do you need assistance with getting out of bed/getting out of a chair/moving?: No Do you have difficulty managing or taking your medications?: No  Follow up appointments reviewed: PCP Follow-up appointment confirmed?: Yes Date of PCP follow-up appointment?: 03/20/23 Follow-up Provider: Dr. Beverely Low Specialist [facility] Follow-up appointment confirmed?: NA Do you need transportation to your follow-up appointment?: No Do you understand care options if your condition(s) worsen?: Yes-patient verbalized understanding  SDOH Interventions Today    Flowsheet Row Most Recent Value  SDOH Interventions   Food Insecurity Interventions Intervention Not Indicated       TOC Interventions Today    Flowsheet Row Most Recent Value  TOC Interventions   TOC Interventions Discussed/Reviewed TOC Interventions Discussed      Interventions Today    Flowsheet Row Most Recent Value  General Interventions   General Interventions Discussed/Reviewed General Interventions Discussed, Doctor Visits  Doctor Visits Discussed/Reviewed Doctor Visits Discussed, PCP  PCP/Specialist Visits Compliance with follow-up visit  Education Interventions   Education Provided Provided Education  Provided Verbal Education On Nutrition, When to see the doctor, Medication, Other  [pain mgmt, bowel mgmt]   Nutrition Interventions   Nutrition Discussed/Reviewed Nutrition Discussed, Adding fruits and vegetables, Increasing proteins, Decreasing fats, Decreasing sugar intake, Decreasing salt, Fluid intake  Pharmacy Interventions   Pharmacy Dicussed/Reviewed Pharmacy Topics Discussed, Medications and their functions  Safety Interventions   Safety Discussed/Reviewed Safety Discussed        Alessandra Grout [facility] Care Management Care Management Community Coordinator Direct Phone: [phone] Toll Free: [phone] Fax: [phone]

## 2023-03-20 ENCOUNTER — Ambulatory Visit: Payer: Medicare Other | Admitting: Family Medicine

## 2023-03-20 ENCOUNTER — Encounter: Payer: Self-pay | Admitting: Family Medicine

## 2023-03-20 VITALS — BP 130/82 | HR 82 | Temp 98.0°F | Resp 18 | Ht 60.0 in | Wt 188.1 lb

## 2023-03-20 DIAGNOSIS — M62830 Muscle spasm of back: Secondary | ICD-10-CM | POA: Diagnosis not present

## 2023-03-20 MED ORDER — MELOXICAM 15 MG PO TABS: 15.0000 mg | ORAL_TABLET | Freq: Every day | ORAL | 1 refills | Status: DC

## 2023-03-20 NOTE — Progress Notes (Signed)
   Subjective:    Patient ID: Tracy [name], female    DOB: 1946/03/29, 77 y.o.   MRN: [id number]  HPI ER f/u- pt went to ED on 8/18 w/ posterior R shoulder pain.  Pt reports shoulder is still TTP.  Now able to turn head which she was not able to do in the ER.  She was told it was a 'muscle spasm w/ inflammation'.  Was prescribed Hydrocodone.  Was on Meloxicam but stopped b/c she developed 'bumps'.  On Tizanidine for night spasm.   Review of Systems For ROS see HPI     Objective:   Physical Exam Vitals reviewed.  Constitutional:      Appearance: Normal appearance.  HENT:     Head: Normocephalic and atraumatic.  Musculoskeletal:     Cervical back: Rigidity (voluntary) and tenderness (over R upper and lower trap w/ obvious spasm) present.  Skin:    General: Skin is warm and dry.  Neurological:     General: No focal deficit present.     Mental Status: She is alert and oriented to person, place, and time.  Psychiatric:        Mood and Affect: Mood normal.        Behavior: Behavior normal.        Thought Content: Thought content normal.           Assessment & Plan:  R trap spasm- new.  Pt reports sxs are better than when she went to the ER but she is still in pain and has limited ROM.  Encouraged use of daily NSAID and continued use of Tizanidine for spasm.  Pt to use heat for relief of pain/spasm.  If no improvement, will refer to Sports Med for evaluation and tx.  Pt expressed understanding and is in agreement w/ plan.

## 2023-03-20 NOTE — Patient Instructions (Signed)
Follow up as needed or as scheduled START the Meloxicam daily Continue to use the Tizanidine nightly HEAT! IF pain isn't improving or worsening, let me know and we can refer to Sports Med or Physical Therapy Call with any questions or concerns Hang in there!!!

## 2023-03-24 ENCOUNTER — Telehealth: Payer: Self-pay | Admitting: *Deleted

## 2023-03-24 NOTE — Progress Notes (Signed)
  Care Coordination   Note   03/24/2023 Name: Tracy [name] MRN: [id number] DOB: Aug 09, 1945  Tracy [name] is a 77 y.o. year old female who sees [name], Helane Rima, MD for primary care. I reached out to [employer] by phone today to offer care coordination services.  [name] was given information about Care Coordination services today including:   The Care Coordination services include support from the care team which includes your Nurse Coordinator, Clinical Social Worker, or Pharmacist.  The Care Coordination team is here to help remove barriers to the health concerns and goals most important to you. Care Coordination services are voluntary, and the patient may decline or stop services at any time by request to their care team member.   Care Coordination Consent Status: Patient agreed to services and verbal consent obtained.   Follow up plan:  Telephone appointment with care coordination team member scheduled for:  03/27/2023  Encounter Outcome:  Pt. Scheduled   Burman Nieves, CCMA Care Coordination Care Guide Direct Dial: [phone]

## 2023-03-27 ENCOUNTER — Ambulatory Visit: Payer: Self-pay | Admitting: Licensed Clinical Social Worker

## 2023-03-27 NOTE — Patient Outreach (Signed)
  Care Coordination   Initial Visit Note   03/27/2023 Name: Tracy [name] MRN: [id number] DOB: 06/26/1946  Tracy [name] is a 77 y.o. year old female who sees [name], Helane Rima, MD for primary care. I spoke with  Tracy Cons Densmore by phone today.  What matters to the patients health and wellness today?  Symptom Management and Resources    Goals Addressed             This Visit's Progress    LCSW-Plan of Care   On track    Activities and task to complete in order to accomplish goals.   Keep all upcoming appointments discussed today Continue with compliance of taking medication prescribed by Doctor Implement healthy coping skills discussed to assist with management of symptoms         SDOH assessments and interventions completed:  Yes  SDOH Interventions Today    Flowsheet Row Most Recent Value  SDOH Interventions   Housing Interventions Intervention Not Indicated        Care Coordination Interventions:  Yes, provided  Interventions Today    Flowsheet Row Most Recent Value  Chronic Disease   Chronic disease during today's visit Hypertension (HTN), Diabetes, Other  [CAD, Malignant neoplasm metastatic to bone, Depression, Anxiety]  General Interventions   General Interventions Discussed/Reviewed General Interventions Discussed, Doctor Visits  Doctor Visits Discussed/Reviewed Doctor Visits Discussed  Mental Health Interventions   Mental Health Discussed/Reviewed Mental Health Discussed, Coping Strategies, Anxiety, Depression  Nutrition Interventions   Nutrition Discussed/Reviewed Nutrition Discussed  Pharmacy Interventions   Pharmacy Dicussed/Reviewed Pharmacy Topics Discussed, Medication Adherence  Safety Interventions   Safety Discussed/Reviewed Safety Discussed       Follow up plan: Follow up call scheduled for 2-4 weeks    Encounter Outcome:  Pt. Visit Completed   Jenel Lucks, MSW, LCSW [facility] Care Management [facility]  [facility] [email or web address] Phone [phone] 3:46 PM

## 2023-03-27 NOTE — Patient Instructions (Signed)
Visit Information  Thank you for taking time to visit with me today. Please don't hesitate to contact me if I can be of assistance to you.   Following are the goals we discussed today:   Goals Addressed             This Visit's Progress    LCSW-Plan of Care   On track    Activities and task to complete in order to accomplish goals.   Keep all upcoming appointments discussed today Continue with compliance of taking medication prescribed by Doctor Implement healthy coping skills discussed to assist with management of symptoms         Our next appointment is by telephone on 09/10 at 3:30 PM  Please call the care guide team at [phone] if you need to cancel or reschedule your appointment.   If you are experiencing a Mental Health or Behavioral Health Crisis or need someone to talk to, please call the Suicide and Crisis Lifeline: 988 call 911   Patient verbalizes understanding of instructions and care plan provided today and agrees to view in MyChart. Active MyChart status and patient understanding of how to access instructions and care plan via MyChart confirmed with patient.     Jenel Lucks, MSW, LCSW [facility] Care Management [place]  [facility] [email or web address] Phone [phone] 3:47 PM

## 2023-03-28 ENCOUNTER — Inpatient Hospital Stay: Payer: Medicare Other

## 2023-03-28 ENCOUNTER — Ambulatory Visit: Payer: Medicare Other

## 2023-03-28 ENCOUNTER — Other Ambulatory Visit: Payer: Medicare Other

## 2023-03-28 VITALS — BP 169/73 | HR 81 | Temp 98.5°F | Resp 12 | Wt 188.5 lb

## 2023-03-28 DIAGNOSIS — C9000 Multiple myeloma not having achieved remission: Secondary | ICD-10-CM

## 2023-03-28 DIAGNOSIS — Z801 Family history of malignant neoplasm of trachea, bronchus and lung: Secondary | ICD-10-CM | POA: Diagnosis not present

## 2023-03-28 DIAGNOSIS — G629 Polyneuropathy, unspecified: Secondary | ICD-10-CM | POA: Diagnosis not present

## 2023-03-28 DIAGNOSIS — I129 Hypertensive chronic kidney disease with stage 1 through stage 4 chronic kidney disease, or unspecified chronic kidney disease: Secondary | ICD-10-CM | POA: Diagnosis not present

## 2023-03-28 DIAGNOSIS — Z8673 Personal history of transient ischemic attack (TIA), and cerebral infarction without residual deficits: Secondary | ICD-10-CM | POA: Diagnosis not present

## 2023-03-28 DIAGNOSIS — G4733 Obstructive sleep apnea (adult) (pediatric): Secondary | ICD-10-CM | POA: Diagnosis not present

## 2023-03-28 DIAGNOSIS — D509 Iron deficiency anemia, unspecified: Secondary | ICD-10-CM | POA: Diagnosis not present

## 2023-03-28 DIAGNOSIS — Z87891 Personal history of nicotine dependence: Secondary | ICD-10-CM | POA: Diagnosis not present

## 2023-03-28 DIAGNOSIS — E538 Deficiency of other specified B group vitamins: Secondary | ICD-10-CM | POA: Diagnosis not present

## 2023-03-28 DIAGNOSIS — E1122 Type 2 diabetes mellitus with diabetic chronic kidney disease: Secondary | ICD-10-CM | POA: Diagnosis not present

## 2023-03-28 DIAGNOSIS — I2510 Atherosclerotic heart disease of native coronary artery without angina pectoris: Secondary | ICD-10-CM | POA: Diagnosis not present

## 2023-03-28 DIAGNOSIS — R110 Nausea: Secondary | ICD-10-CM | POA: Diagnosis not present

## 2023-03-28 DIAGNOSIS — I252 Old myocardial infarction: Secondary | ICD-10-CM | POA: Diagnosis not present

## 2023-03-28 DIAGNOSIS — Z95828 Presence of other vascular implants and grafts: Secondary | ICD-10-CM

## 2023-03-28 DIAGNOSIS — Z923 Personal history of irradiation: Secondary | ICD-10-CM | POA: Diagnosis not present

## 2023-03-28 DIAGNOSIS — Z79899 Other long term (current) drug therapy: Secondary | ICD-10-CM | POA: Diagnosis not present

## 2023-03-28 DIAGNOSIS — Z5112 Encounter for antineoplastic immunotherapy: Secondary | ICD-10-CM | POA: Diagnosis not present

## 2023-03-28 DIAGNOSIS — C7951 Secondary malignant neoplasm of bone: Secondary | ICD-10-CM

## 2023-03-28 DIAGNOSIS — Z7189 Other specified counseling: Secondary | ICD-10-CM

## 2023-03-28 DIAGNOSIS — E1140 Type 2 diabetes mellitus with diabetic neuropathy, unspecified: Secondary | ICD-10-CM | POA: Diagnosis not present

## 2023-03-28 DIAGNOSIS — N189 Chronic kidney disease, unspecified: Secondary | ICD-10-CM | POA: Diagnosis not present

## 2023-03-28 DIAGNOSIS — Z853 Personal history of malignant neoplasm of breast: Secondary | ICD-10-CM | POA: Diagnosis not present

## 2023-03-28 DIAGNOSIS — Z803 Family history of malignant neoplasm of breast: Secondary | ICD-10-CM | POA: Diagnosis not present

## 2023-03-28 DIAGNOSIS — G2581 Restless legs syndrome: Secondary | ICD-10-CM | POA: Diagnosis not present

## 2023-03-28 DIAGNOSIS — Z800 Family history of malignant neoplasm of digestive organs: Secondary | ICD-10-CM | POA: Diagnosis not present

## 2023-03-28 LAB — CMP (CANCER CENTER ONLY)
ALT: 11 U/L (ref 0–44)
AST: 13 U/L — ABNORMAL LOW (ref 15–41)
Albumin: 4.2 g/dL (ref 3.5–5.0)
Alkaline Phosphatase: 58 U/L (ref 38–126)
Anion gap: 9 (ref 5–15)
BUN: 17 mg/dL (ref 8–23)
CO2: 25 mmol/L (ref 22–32)
Calcium: 9.1 mg/dL (ref 8.9–10.3)
Chloride: 108 mmol/L (ref 98–111)
Creatinine: 1.24 mg/dL — ABNORMAL HIGH (ref 0.44–1.00)
GFR, Estimated: 44 mL/min — ABNORMAL LOW (ref >60)
Glucose, Bld: 108 mg/dL — ABNORMAL HIGH (ref 70–99)
Potassium: 4 mmol/L (ref 3.5–5.1)
Sodium: 142 mmol/L (ref 135–145)
Total Bilirubin: 0.3 mg/dL (ref 0.3–1.2)
Total Protein: 6.5 g/dL (ref 6.5–8.1)

## 2023-03-28 LAB — CBC WITH DIFFERENTIAL (CANCER CENTER ONLY)
Abs Immature Granulocytes: 0.01 10*3/uL (ref 0.00–0.07)
Basophils Absolute: 0 10*3/uL (ref 0.0–0.1)
Basophils Relative: 1 %
Eosinophils Absolute: 0.1 10*3/uL (ref 0.0–0.5)
Eosinophils Relative: 1 %
HCT: 30.6 % — ABNORMAL LOW (ref 36.0–46.0)
Hemoglobin: 10.3 g/dL — ABNORMAL LOW (ref 12.0–15.0)
Immature Granulocytes: 0 %
Lymphocytes Relative: 40 %
Lymphs Abs: 2 10*3/uL (ref 0.7–4.0)
MCH: 31.3 pg (ref 26.0–34.0)
MCHC: 33.7 g/dL (ref 30.0–36.0)
MCV: 93 fL (ref 80.0–100.0)
Monocytes Absolute: 0.4 10*3/uL (ref 0.1–1.0)
Monocytes Relative: 7 %
Neutro Abs: 2.5 10*3/uL (ref 1.7–7.7)
Neutrophils Relative %: 51 %
Platelet Count: 268 10*3/uL (ref 150–400)
RBC: 3.29 MIL/uL — ABNORMAL LOW (ref 3.87–5.11)
RDW: 13.3 % (ref 11.5–15.5)
WBC Count: 4.9 10*3/uL (ref 4.0–10.5)
nRBC: 0 % (ref 0.0–0.2)

## 2023-03-28 MED ORDER — SODIUM CHLORIDE 0.9% FLUSH
10.0000 mL | Freq: Once | INTRAVENOUS | Status: AC
  Administered 2023-03-28: 10 mL

## 2023-03-28 MED ORDER — FAMOTIDINE IN NACL 20-0.9 MG/50ML-% IV SOLN
20.0000 mg | Freq: Once | INTRAVENOUS | Status: AC
  Administered 2023-03-28: 20 mg via INTRAVENOUS
  Filled 2023-03-28: qty 50

## 2023-03-28 MED ORDER — ACETAMINOPHEN 325 MG PO TABS
650.0000 mg | ORAL_TABLET | Freq: Once | ORAL | Status: AC
  Administered 2023-03-28: 650 mg via ORAL
  Filled 2023-03-28: qty 2

## 2023-03-28 MED ORDER — SODIUM CHLORIDE 0.9% FLUSH
10.0000 mL | INTRAVENOUS | Status: DC | PRN
  Administered 2023-03-28: 10 mL

## 2023-03-28 MED ORDER — CYANOCOBALAMIN 1000 MCG/ML IJ SOLN
1000.0000 ug | Freq: Once | INTRAMUSCULAR | Status: AC
  Administered 2023-03-28: 1000 ug via INTRAMUSCULAR
  Filled 2023-03-28: qty 1

## 2023-03-28 MED ORDER — SODIUM CHLORIDE 0.9 % IV SOLN
16.0000 mg/kg | Freq: Once | INTRAVENOUS | Status: AC
  Administered 2023-03-28: 1300 mg via INTRAVENOUS
  Filled 2023-03-28: qty 5

## 2023-03-28 MED ORDER — DIPHENHYDRAMINE HCL 25 MG PO CAPS
50.0000 mg | ORAL_CAPSULE | Freq: Once | ORAL | Status: AC
  Administered 2023-03-28: 50 mg via ORAL
  Filled 2023-03-28: qty 2

## 2023-03-28 MED ORDER — HEPARIN SOD (PORK) LOCK FLUSH 100 UNIT/ML IV SOLN
500.0000 [IU] | Freq: Once | INTRAVENOUS | Status: AC | PRN
  Administered 2023-03-28: 500 [IU]

## 2023-03-28 MED ORDER — SODIUM CHLORIDE 0.9 % IV SOLN
16.0000 mg | Freq: Once | INTRAVENOUS | Status: AC
  Administered 2023-03-28: 16 mg via INTRAVENOUS
  Filled 2023-03-28: qty 1.6

## 2023-03-28 MED ORDER — SODIUM CHLORIDE 0.9 % IV SOLN: Freq: Once | INTRAVENOUS | Status: AC

## 2023-03-28 NOTE — Progress Notes (Signed)
Per Dr. Candise Che, OK to treat with elevated BP 183/72.

## 2023-03-28 NOTE — Patient Instructions (Signed)
[facility]  Discharge Instructions: Thank you for choosing [facility] to provide your oncology and hematology care.   If you have a lab appointment with the Cancer Center, please go directly to the Cancer Center and check in at the registration area.   Wear comfortable clothing and clothing appropriate for easy access to any Portacath or PICC line.   We strive to give you quality time with your provider. You may need to reschedule your appointment if you arrive late (15 or more minutes).  Arriving late affects you and other patients whose appointments are after yours.  Also, if you miss three or more appointments without notifying the office, you may be dismissed from the clinic at the provider's discretion.      For prescription refill requests, have your pharmacy contact our office and allow 72 hours for refills to be completed.    Today you received the following chemotherapy and/or immunotherapy agents: Daratumumab.       To help prevent nausea and vomiting after your treatment, we encourage you to take your nausea medication as directed.  BELOW ARE SYMPTOMS THAT SHOULD BE REPORTED IMMEDIATELY: *FEVER GREATER THAN 100.4 F (38 C) OR HIGHER *CHILLS OR SWEATING *NAUSEA AND VOMITING THAT IS NOT CONTROLLED WITH YOUR NAUSEA MEDICATION *UNUSUAL SHORTNESS OF BREATH *UNUSUAL BRUISING OR BLEEDING *URINARY PROBLEMS (pain or burning when urinating, or frequent urination) *BOWEL PROBLEMS (unusual diarrhea, constipation, pain near the anus) TENDERNESS IN MOUTH AND THROAT WITH OR WITHOUT PRESENCE OF ULCERS (sore throat, sores in mouth, or a toothache) UNUSUAL RASH, SWELLING OR PAIN  UNUSUAL VAGINAL DISCHARGE OR ITCHING   Items with * indicate a potential emergency and should be followed up as soon as possible or go to the Emergency Department if any problems should occur.  Please show the CHEMOTHERAPY ALERT CARD or IMMUNOTHERAPY ALERT CARD at  check-in to the Emergency Department and triage nurse.  Should you have questions after your visit or need to cancel or reschedule your appointment, please contact [facility]  Dept: [phone]  and follow the prompts.  Office hours are 8:00 a.m. to 4:30 p.m. Monday - Friday. Please note that voicemails left after 4:00 p.m. may not be returned until the following business day.  We are closed weekends and major holidays. You have access to a nurse at all times for urgent questions. Please call the main number to the clinic Dept: [phone] and follow the prompts.   For any non-urgent questions, you may also contact your provider using MyChart. We now offer e-Visits for anyone 18 and older to request care online for non-urgent symptoms. For details visit [email or web address].   Also download the MyChart app! Go to the app store, search "MyChart", open the app, select [place], and log in with your MyChart username and password.   

## 2023-03-31 ENCOUNTER — Other Ambulatory Visit: Payer: Self-pay | Admitting: Family Medicine

## 2023-04-01 LAB — MULTIPLE MYELOMA PANEL, SERUM
Albumin SerPl Elph-Mcnc: 3.7 g/dL (ref 2.9–4.4)
Albumin/Glob SerPl: 1.5 (ref 0.7–1.7)
Alpha 1: 0.2 g/dL (ref 0.0–0.4)
Alpha2 Glob SerPl Elph-Mcnc: 1 g/dL (ref 0.4–1.0)
B-Globulin SerPl Elph-Mcnc: 1 g/dL (ref 0.7–1.3)
Gamma Glob SerPl Elph-Mcnc: 0.4 g/dL (ref 0.4–1.8)
Globulin, Total: 2.6 g/dL (ref 2.2–3.9)
IgA: 61 mg/dL — ABNORMAL LOW (ref 64–422)
IgG (Immunoglobin G), Serum: 421 mg/dL — ABNORMAL LOW (ref 586–1602)
IgM (Immunoglobulin M), Srm: 40 mg/dL (ref 26–217)
M Protein SerPl Elph-Mcnc: 0.2 g/dL — ABNORMAL HIGH
Total Protein ELP: 6.3 g/dL (ref 6.0–8.5)

## 2023-04-08 ENCOUNTER — Ambulatory Visit: Payer: Self-pay | Admitting: Licensed Clinical Social Worker

## 2023-04-10 ENCOUNTER — Ambulatory Visit: Payer: Self-pay | Admitting: Licensed Clinical Social Worker

## 2023-04-10 NOTE — Patient Instructions (Signed)
Visit Information  Thank you for taking time to visit with me today. Please don't hesitate to contact me if I can be of assistance to you.   Following are the goals we discussed today:   Goals Addressed             This Visit's Progress    LCSW-Plan of Care   On track    Activities and task to complete in order to accomplish goals.   Keep all upcoming appointments discussed today Continue with compliance of taking medication prescribed by Doctor Implement healthy coping skills discussed to assist with management of symptoms         Our next appointment is by telephone on 09/12   Please call the care guide team at [phone] if you need to cancel or reschedule your appointment.   If you are experiencing a Mental Health or Behavioral Health Crisis or need someone to talk to, please call the Suicide and Crisis Lifeline: 988 call 911   Patient verbalizes understanding of instructions and care plan provided today and agrees to view in MyChart. Active MyChart status and patient understanding of how to access instructions and care plan via MyChart confirmed with patient.     Jenel Lucks, MSW, LCSW [facility] Care Management [place]  [facility] [email or web address] Phone [phone] 9:18 AM

## 2023-04-10 NOTE — Patient Outreach (Signed)
  Care Coordination   Follow Up Visit Note   04/08/2023 Name: Tracy [name] MRN: [id number] DOB: 03/02/1946  Tracy Cons Tracy [name] is a 77 y.o. year old female who sees Tracy [name], Tracy Rima, MD for primary care. I spoke with  Tracy [name] by phone today.  What matters to the patients health and wellness today?  Reminder of upcoming appt    Goals Addressed             This Visit's Progress    LCSW-Plan of Care   On track    Activities and task to complete in order to accomplish goals.   Keep all upcoming appointments discussed today Continue with compliance of taking medication prescribed by Doctor Implement healthy coping skills discussed to assist with management of symptoms         SDOH assessments and interventions completed:  No     Care Coordination Interventions:  Yes, provided  Interventions Today    Flowsheet Row Most Recent Value  Chronic Disease   Chronic disease during today's visit Hypertension (HTN), Diabetes, Other  [Depression and Anxiety]  General Interventions   Doctor Visits Discussed/Reviewed Doctor Visits Reviewed       Follow up plan: Follow up call scheduled for 04/10/23    Encounter Outcome:  Patient Visit Completed   Jenel Lucks, MSW, LCSW [facility] Care Management [facility]  [facility] [email or web address] Phone [phone] 9:18 AM

## 2023-04-11 ENCOUNTER — Other Ambulatory Visit: Payer: Self-pay

## 2023-04-11 NOTE — Patient Instructions (Signed)
Visit Information  Thank you for taking time to visit with me today. Please don't hesitate to contact me if I can be of assistance to you.   Following are the goals we discussed today:   Goals Addressed             This Visit's Progress    LCSW-Plan of Care   On track    Activities and task to complete in order to accomplish goals.   Keep all upcoming appointments discussed today Continue with compliance of taking medication prescribed by Doctor Implement healthy coping skills discussed to assist with management of symptoms         Our next appointment is by telephone on 10/03 at 1 PM  Please call the care guide team at [phone] if you need to cancel or reschedule your appointment.   If you are experiencing a Mental Health or Behavioral Health Crisis or need someone to talk to, please call the Suicide and Crisis Lifeline: 988 call 911   Patient verbalizes understanding of instructions and care plan provided today and agrees to view in MyChart. Active MyChart status and patient understanding of how to access instructions and care plan via MyChart confirmed with patient.     Jenel Lucks, MSW, LCSW [facility] Care Management [place]  [facility] [email or web address] Phone [phone] 5:19 AM

## 2023-04-11 NOTE — Patient Outreach (Signed)
Care Coordination   Follow Up Visit Note   04/10/2023 Name: BANITA CREDEUR MRN: [id number] DOB: May 31, 1946  Real Cons Ebling is a 77 y.o. year old female who sees [name], Helane Rima, MD for primary care. I spoke with  Real Cons Paskett by phone today.  What matters to the patients health and wellness today?  Symptom Management, Transportation    Goals Addressed             This Visit's Progress    LCSW-Plan of Care   On track    Activities and task to complete in order to accomplish goals.   Keep all upcoming appointments discussed today Continue with compliance of taking medication prescribed by Doctor Implement healthy coping skills discussed to assist with management of symptoms         SDOH assessments and interventions completed:  Yes  SDOH Interventions Today    Flowsheet Row Most Recent Value  SDOH Interventions   Transportation Interventions [employer]), Other (Comment)  [Pt will obtain transportation thru Oncology dept. LCSW discussed [facility] [place] services]        Care Coordination Interventions:  Yes, provided  Interventions Today    Flowsheet Row Most Recent Value  Chronic Disease   Chronic disease during today's visit Hypertension (HTN), Diabetes, Other  [CAD, Vertigo, Anxiety]  General Interventions   General Interventions Discussed/Reviewed General Interventions Reviewed, [employer], Doctor Visits  [Pt's oncology nurse will provide pt info on transportation through Oncology department. LCSW discussed [employer] [place] ([facility])]  Doctor Visits Discussed/Reviewed Doctor Visits Reviewed  Mental Health Interventions   Mental Health Discussed/Reviewed Mental Health Reviewed, Coping Strategies, Depression, Anxiety  [Recent stressors discussed, including strategies to promote self-care and boundary setting]  Nutrition Interventions   Nutrition Discussed/Reviewed Nutrition Reviewed  Pharmacy Interventions   Pharmacy  Dicussed/Reviewed Pharmacy Topics Reviewed, Medication Adherence  Safety Interventions   Safety Discussed/Reviewed Safety Reviewed       Follow up plan: Follow up call scheduled for 2-4 weeks    Encounter Outcome:  Patient Visit Completed   Jenel Lucks, MSW, LCSW [facility] Care Management [facility]  [facility] [email or web address] Phone [phone] 5:19 AM

## 2023-04-24 ENCOUNTER — Encounter: Payer: Self-pay | Admitting: Pharmacist

## 2023-04-24 NOTE — Progress Notes (Unsigned)
Patient appearing on report for [employer] [facility] - Hypertension Control report due to last documented ambulatory blood pressure of 169/73 on 03/28/2023. Next appointment with PCP is not currently set.   Outreached patient to discuss hypertension control and medication management.   Current antihypertensives: none  Patient has taken losartan 100mg  daily in past but was stopped 03/2021 due to low blood pressure.   BP Readings from Last 3 Encounters:  03/28/23 (!) 169/73 (taken at infusion center)  03/20/23 130/82 (checked at PCP office)  03/16/23 (!) 155/78 (during ED visit)    Patient has an automated upper arm home BP machine.  Current blood pressure readings: per patient usually 130's / 60's.  (Patient was not able to check blood pressure during call today)  She does report that occasionally her blood pressure has been 160 / 80 - mostly when she is stressed.  She is checking blood pressure every 2 or 3 days and at varying times of day.   Patient denies hypotensive signs and symptoms including no dizziness, lightheadedness.  Patient denies hypertensive symptoms including no headache, chest pain, shortness of breath.  She mentions that over the last 1 to 2 months she has had to urinate each night and usually has a BM also.   Patient does not check blood glucose at home. She is taking metformin 500mg  twice a day - she has stopped metformin for a few months but restarted about 2 months ago.    Assessment/Plan:  Hypertension:  Home blood pressure usually at goal but blood pressure at infusion center usually above goal - - Reviewed goal blood pressure <140/90 - Reviewed appropriate home BP monitoring technique (avoid caffeine, smoking, and exercise for 30 minutes before checking, rest for at least 5 minutes before taking BP, sit with feet flat on the floor and back against a hard surface, uncross legs, and rest arm on flat surface) - Reviewed to check blood pressure 2 to 3 times per  week, document, and provide at next provider visit - Recommend patient contact office if blood pressure consistently > 140/90 or if on any occasion is > 180/100  Type 2 DM - last A1c was at goal of < 7.0% - recent loose stools might be related to restart of metformin.  - Discussed that metformin can sometime cause loose stools. Reminded patient to take with food. Discussed possible switch to metformin XR since it might not cause as much GI issues but patient preferred not to make any changes.  - Discussed using glucometer to check blood glucose at home- patient declined  Reminded patient about [facility] over-the-counter benefits. Patient has $50 per quarter. She is using regularly    Henrene Pastor, PharmD Clinical Pharmacist [facility] Primary Care  Population Health [phone]

## 2023-04-25 ENCOUNTER — Inpatient Hospital Stay: Payer: Medicare Other

## 2023-04-25 ENCOUNTER — Inpatient Hospital Stay: Payer: Medicare Other | Admitting: Hematology

## 2023-04-25 ENCOUNTER — Inpatient Hospital Stay: Payer: Medicare Other | Attending: Hematology

## 2023-04-25 ENCOUNTER — Other Ambulatory Visit: Payer: Self-pay | Admitting: Hematology

## 2023-04-25 VITALS — BP 152/84 | HR 80 | Temp 98.7°F | Resp 16

## 2023-04-25 VITALS — BP 135/61 | HR 72 | Temp 98.2°F | Resp 16 | Ht 60.0 in | Wt 183.1 lb

## 2023-04-25 DIAGNOSIS — C9000 Multiple myeloma not having achieved remission: Secondary | ICD-10-CM

## 2023-04-25 DIAGNOSIS — E1140 Type 2 diabetes mellitus with diabetic neuropathy, unspecified: Secondary | ICD-10-CM | POA: Diagnosis not present

## 2023-04-25 DIAGNOSIS — Z95828 Presence of other vascular implants and grafts: Secondary | ICD-10-CM

## 2023-04-25 DIAGNOSIS — G4733 Obstructive sleep apnea (adult) (pediatric): Secondary | ICD-10-CM | POA: Diagnosis not present

## 2023-04-25 DIAGNOSIS — Z923 Personal history of irradiation: Secondary | ICD-10-CM | POA: Diagnosis not present

## 2023-04-25 DIAGNOSIS — Z800 Family history of malignant neoplasm of digestive organs: Secondary | ICD-10-CM | POA: Diagnosis not present

## 2023-04-25 DIAGNOSIS — E538 Deficiency of other specified B group vitamins: Secondary | ICD-10-CM | POA: Insufficient documentation

## 2023-04-25 DIAGNOSIS — C7951 Secondary malignant neoplasm of bone: Secondary | ICD-10-CM | POA: Diagnosis not present

## 2023-04-25 DIAGNOSIS — Z803 Family history of malignant neoplasm of breast: Secondary | ICD-10-CM | POA: Insufficient documentation

## 2023-04-25 DIAGNOSIS — I2510 Atherosclerotic heart disease of native coronary artery without angina pectoris: Secondary | ICD-10-CM | POA: Diagnosis not present

## 2023-04-25 DIAGNOSIS — Z7189 Other specified counseling: Secondary | ICD-10-CM

## 2023-04-25 DIAGNOSIS — E1122 Type 2 diabetes mellitus with diabetic chronic kidney disease: Secondary | ICD-10-CM | POA: Diagnosis not present

## 2023-04-25 DIAGNOSIS — Z8673 Personal history of transient ischemic attack (TIA), and cerebral infarction without residual deficits: Secondary | ICD-10-CM | POA: Insufficient documentation

## 2023-04-25 DIAGNOSIS — D509 Iron deficiency anemia, unspecified: Secondary | ICD-10-CM | POA: Insufficient documentation

## 2023-04-25 DIAGNOSIS — I252 Old myocardial infarction: Secondary | ICD-10-CM | POA: Diagnosis not present

## 2023-04-25 DIAGNOSIS — Z5112 Encounter for antineoplastic immunotherapy: Secondary | ICD-10-CM | POA: Diagnosis not present

## 2023-04-25 DIAGNOSIS — Z79899 Other long term (current) drug therapy: Secondary | ICD-10-CM | POA: Diagnosis not present

## 2023-04-25 DIAGNOSIS — N189 Chronic kidney disease, unspecified: Secondary | ICD-10-CM | POA: Diagnosis not present

## 2023-04-25 DIAGNOSIS — Z90710 Acquired absence of both cervix and uterus: Secondary | ICD-10-CM | POA: Insufficient documentation

## 2023-04-25 DIAGNOSIS — I129 Hypertensive chronic kidney disease with stage 1 through stage 4 chronic kidney disease, or unspecified chronic kidney disease: Secondary | ICD-10-CM | POA: Diagnosis not present

## 2023-04-25 DIAGNOSIS — Z853 Personal history of malignant neoplasm of breast: Secondary | ICD-10-CM | POA: Diagnosis not present

## 2023-04-25 DIAGNOSIS — R110 Nausea: Secondary | ICD-10-CM | POA: Diagnosis not present

## 2023-04-25 DIAGNOSIS — Z87891 Personal history of nicotine dependence: Secondary | ICD-10-CM | POA: Insufficient documentation

## 2023-04-25 DIAGNOSIS — G2581 Restless legs syndrome: Secondary | ICD-10-CM | POA: Insufficient documentation

## 2023-04-25 DIAGNOSIS — Z801 Family history of malignant neoplasm of trachea, bronchus and lung: Secondary | ICD-10-CM | POA: Insufficient documentation

## 2023-04-25 DIAGNOSIS — G629 Polyneuropathy, unspecified: Secondary | ICD-10-CM | POA: Insufficient documentation

## 2023-04-25 LAB — CBC WITH DIFFERENTIAL (CANCER CENTER ONLY)
Abs Immature Granulocytes: 0.01 10*3/uL (ref 0.00–0.07)
Basophils Absolute: 0 10*3/uL (ref 0.0–0.1)
Basophils Relative: 1 %
Eosinophils Absolute: 0.1 10*3/uL (ref 0.0–0.5)
Eosinophils Relative: 2 %
HCT: 33.9 % — ABNORMAL LOW (ref 36.0–46.0)
Hemoglobin: 11.4 g/dL — ABNORMAL LOW (ref 12.0–15.0)
Immature Granulocytes: 0 %
Lymphocytes Relative: 39 %
Lymphs Abs: 1.9 10*3/uL (ref 0.7–4.0)
MCH: 31.8 pg (ref 26.0–34.0)
MCHC: 33.6 g/dL (ref 30.0–36.0)
MCV: 94.4 fL (ref 80.0–100.0)
Monocytes Absolute: 0.3 10*3/uL (ref 0.1–1.0)
Monocytes Relative: 6 %
Neutro Abs: 2.5 10*3/uL (ref 1.7–7.7)
Neutrophils Relative %: 52 %
Platelet Count: 276 10*3/uL (ref 150–400)
RBC: 3.59 MIL/uL — ABNORMAL LOW (ref 3.87–5.11)
RDW: 13.2 % (ref 11.5–15.5)
WBC Count: 4.8 10*3/uL (ref 4.0–10.5)
nRBC: 0 % (ref 0.0–0.2)

## 2023-04-25 LAB — CMP (CANCER CENTER ONLY)
ALT: 11 U/L (ref 0–44)
AST: 14 U/L — ABNORMAL LOW (ref 15–41)
Albumin: 4.4 g/dL (ref 3.5–5.0)
Alkaline Phosphatase: 72 U/L (ref 38–126)
Anion gap: 8 (ref 5–15)
BUN: 12 mg/dL (ref 8–23)
CO2: 27 mmol/L (ref 22–32)
Calcium: 9.5 mg/dL (ref 8.9–10.3)
Chloride: 107 mmol/L (ref 98–111)
Creatinine: 1.47 mg/dL — ABNORMAL HIGH (ref 0.44–1.00)
GFR, Estimated: 36 mL/min — ABNORMAL LOW (ref >60)
Glucose, Bld: 123 mg/dL — ABNORMAL HIGH (ref 70–99)
Potassium: 4.3 mmol/L (ref 3.5–5.1)
Sodium: 142 mmol/L (ref 135–145)
Total Bilirubin: 0.3 mg/dL (ref 0.3–1.2)
Total Protein: 7 g/dL (ref 6.5–8.1)

## 2023-04-25 MED ORDER — SODIUM CHLORIDE 0.9 % IV SOLN: Freq: Once | INTRAVENOUS | Status: AC

## 2023-04-25 MED ORDER — HEPARIN SOD (PORK) LOCK FLUSH 100 UNIT/ML IV SOLN
500.0000 [IU] | Freq: Once | INTRAVENOUS | Status: AC | PRN
  Administered 2023-04-25: 500 [IU]

## 2023-04-25 MED ORDER — SODIUM CHLORIDE 0.9 % IV SOLN
16.0000 mg | Freq: Once | INTRAVENOUS | Status: AC
  Administered 2023-04-25: 16 mg via INTRAVENOUS
  Filled 2023-04-25: qty 1.6

## 2023-04-25 MED ORDER — ALTEPLASE 2 MG IJ SOLR
2.0000 mg | Freq: Once | INTRAMUSCULAR | Status: AC
  Administered 2023-04-25: 2 mg
  Filled 2023-04-25: qty 2

## 2023-04-25 MED ORDER — SODIUM CHLORIDE 0.9% FLUSH
10.0000 mL | Freq: Once | INTRAVENOUS | Status: AC
  Administered 2023-04-25: 10 mL

## 2023-04-25 MED ORDER — FAMOTIDINE IN NACL 20-0.9 MG/50ML-% IV SOLN
20.0000 mg | Freq: Once | INTRAVENOUS | Status: AC
  Administered 2023-04-25: 20 mg via INTRAVENOUS
  Filled 2023-04-25: qty 50

## 2023-04-25 MED ORDER — SODIUM CHLORIDE 0.9% FLUSH
10.0000 mL | INTRAVENOUS | Status: DC | PRN
  Administered 2023-04-25: 10 mL

## 2023-04-25 MED ORDER — SODIUM CHLORIDE 0.9 % IV SOLN
16.0000 mg/kg | Freq: Once | INTRAVENOUS | Status: AC
  Administered 2023-04-25: 1300 mg via INTRAVENOUS
  Filled 2023-04-25: qty 5

## 2023-04-25 MED ORDER — DIPHENHYDRAMINE HCL 25 MG PO CAPS
50.0000 mg | ORAL_CAPSULE | Freq: Once | ORAL | Status: AC
  Administered 2023-04-25: 50 mg via ORAL
  Filled 2023-04-25: qty 2

## 2023-04-25 MED ORDER — CYANOCOBALAMIN 1000 MCG/ML IJ SOLN
1000.0000 ug | Freq: Once | INTRAMUSCULAR | Status: AC
  Administered 2023-04-25: 1000 ug via INTRAMUSCULAR
  Filled 2023-04-25: qty 1

## 2023-04-25 MED ORDER — ACETAMINOPHEN 325 MG PO TABS
650.0000 mg | ORAL_TABLET | Freq: Once | ORAL | Status: AC
  Administered 2023-04-25: 650 mg via ORAL
  Filled 2023-04-25: qty 2

## 2023-04-25 NOTE — Progress Notes (Signed)
[facility] Follow up:   Date of Service: 04/25/23   Tracy Hatch, MD [address] [place] [address]  CC: Follow-up for continued evaluation and management of multiple myeloma  SUMMARY OF ONCOLOGIC HISTORY: Oncology History  History of right breast cancer  04/16/2000 Surgery   Left breast: Triple negative  invasive ductal cancer treated with lumpectomy, adjuvant chemotherapy, radiation , in [place], unknown stage   06/07/2015 Mammogram   Right breast mass 6x 6 x 5 mm, right axillary lymph node with slight cortex thickening measured 5 mm    06/13/2015 Initial Diagnosis   Right breast needle biopsy: Invasive ductal carcinoma, grade 1, right axillary lymph node biopsy negative , ER 95%, PR 5%, Ki-67 10%, HER-2 negative   06/13/2015 Clinical Stage   Stage IA: T1b N0   07/07/2015 Surgery   Right lumpectomy: Invasive ductal carcinoma grade 1, 1 cm span, with low-grade DCIS, DCIS focally 0.1 cm to inferior margin, 0/3 lymph nodes negative, ER 95%, PR 5%, HER-2 negative ratio 1.1, Ki-67 10%   07/07/2015 Pathologic Stage   Stage IA: T1c N0   07/13/2015 Procedure   Breast High/Moderate Risk Panel reveals no clinically significant variant at ATM, BRCA1, BRCA2, CDH1, CHEK2, PALB2, PTEN, and TP53.     08/23/2015 - 09/21/2015 Radiation Therapy   Adjuvant Radiation: Right breast/ 42.5Gy at 2.5 Gy per fraction x 17 fractions.   Right breast boost/ 7.5 Gy at 2.5 Gy per fraction x 3 fractions    Anti-estrogen oral therapy   Patient refused antiestrogen therapy   10/20/2015 Survivorship   Survivorship care plan completed and mailed to patient in lieu of in person visit at her request   Iron deficiency anemia  Multiple myeloma (HCC)  01/25/2020 Initial Diagnosis   Multiple myeloma (HCC)   02/11/2020 - 05/19/2020 Adjuvant Chemotherapy   Daratumumab Weekly    02/29/2020 - 03/13/2020 Radiation Therapy   The targets were treated to a total dose of 20 Gy in 10  fractions of 2 Gy each to the left hip and L5 using one plan.   06/02/2020 - 10/05/2020 Adjuvant Chemotherapy   Daratumumab and Carfilzomib   10/19/2020 -  Adjuvant Chemotherapy   Maintenance Daratumumab--initially every 2 weeks, then every 4 weeks.    05/24/2022 -  Chemotherapy   Patient is on Treatment Plan : MYELOMA Daratumumab IV q28d     Malignant neoplasm metastatic to bone (HCC)  02/08/2021 Initial Diagnosis   Bone metastases (HCC)   05/24/2022 -  Chemotherapy   Patient is on Treatment Plan : MYELOMA Daratumumab IV q28d       CURRENT THERAPY: Daratumumab/B12 injection   INTERVAL HISTORY:  Tracy [name] is a 77 y.o. female here for continued evaluation and management of multiple myeloma. She was last seen by me on 12/06/2022 and complained of neuropathy and reported a previous right axillary lymph node with previous tenderness, which had resolved.   Patient was most recently seen  by [facility] on 01/31/2023 and complained of increased fatigue/drowsiness, postprandial nausea, and stable neuropathy in her hands affecting her grip.   Today, she reports that she has been doing fairly well since her last clinical visit. She denies any new or significant toxicities with Darzalex once a month. Patient reports that her neuropathy has been bothersome recently.   Patient has no leg swelling on examination, though she reports that she feels like they are swollen.  She reports having a burning sensation/pain in  her left chest, which is not at the site of her port.  Patient reports that she previously endorsed redness on her chest at the site of her port, which resolved later that day.  She reports recent postprandial nausea and was given antinausea medication. No infection issues or major medication changes.  She is taking metformin to manage her blood sugar levels. She reports that she does not have DM. Patient notes that her blood sugar has been well controlled and her hemoglobin A1c was  recently in the 6s.  She did previously receive the COVID-19 vaccine last year. Patient reports that she has not received shinrix or pneumonia vaccine.    Patient Active Problem List   Diagnosis Date Noted   RLS (restless legs syndrome) 06/12/2022   Malignant neoplasm metastatic to bone (HCC) 02/08/2021   Angiodysplasia of intestine 08/23/2020   Port-A-Cath in place 08/04/2020   Counseling regarding advance care planning and goals of care 02/07/2020   Multiple myeloma (HCC) 01/25/2020   Dizziness 10/12/2019   Post-nasal drainage 10/12/2019   Allergic rhinitis 08/19/2018   Type 2 diabetes mellitus with diabetic neuropathy, unspecified (HCC) 11/05/2017   Encounter for long-term use of muscle relaxants 09/24/2017   CAD in native artery 09/24/2017   OSA (obstructive sleep apnea) 09/24/2017   Snorings 09/24/2017   Asthenia 06/30/2017   Morbid obesity (HCC) 06/02/2017   Depression 06/02/2017   Iron deficiency anemia 09/23/2016   Anemia of chronic disease 09/28/2015   Genetic testing 08/21/2015   History of left breast cancer 07/13/2015   History of right breast cancer 06/20/2015   Hearing loss due to cerumen impaction 12/29/2014   Allergy to adhesive tape 05/19/2014   Hyperlipidemia 12/06/2013   GERD (gastroesophageal reflux disease) 12/06/2013   Cervical disc disease 11/11/2013   Osteopenia 05/25/2013   Allergic asthma 12/24/2012   Insomnia 04/02/2012   Physical exam, annual 04/02/2012   HTN (hypertension) 02/03/2012   Vertigo, benign positional 02/03/2012   Left groin pain 02/03/2012   Hip pain 10/10/2011   TIA (transient ischemic attack) 07/24/2011   Allergic reaction 07/05/2011   Osteoarthrosis involving lower leg 10/19/2010   Disorder of bone and cartilage 05/01/2010   Generalized anxiety disorder 05/01/2010   Vitamin D deficiency 02/20/2010   Unspecified chronic bronchitis (HCC) 08/24/2009   Other lymphedema 10/29/2007    is allergic to  bacitracin-neomycin-polymyxin  [neomycin-bacitracin zn-polymyx], nsaids, tape, ambien [zolpidem tartrate], amoxicillin, clavulanic acid, contrast media [iodinated contrast media], latex, prednisone, and tessalon [benzonatate].  MEDICAL HISTORY: Past Medical History:  Diagnosis Date   Angiodysplasia of intestine 08/23/2020   Anxiety    Arthritis    Breast cancer (HCC) 06/13/15   Cancer (HCC) 2000   breast cancer   Chronic bronchitis (HCC)    Chronic bronchitis (HCC)    Hyperlipidemia    Hypertension    Myocardial infarction [facility]) 2001   Personal history of radiation therapy    Restless leg    Stroke (HCC) 2004   TIA, no deficits    SURGICAL HISTORY: Past Surgical History:  Procedure Laterality Date   ABDOMINAL HYSTERECTOMY  1985   BREAST LUMPECTOMY Left 2000   radiation and chemo   BREAST LUMPECTOMY Right 2016   radiation   BREAST SURGERY  2001   lt breast lumpectomy   COLONOSCOPY WITH ESOPHAGOGASTRODUODENOSCOPY (EGD)  04/2020   ENTEROSCOPY N/A 03/15/2021   Procedure: ENTEROSCOPY;  Surgeon: Rachael Fee, MD;  Location: [facility];  Service: Endoscopy;  Laterality: N/A;   GIVENS CAPSULE STUDY  07/2020   HOT HEMOSTASIS N/A 03/15/2021   Procedure: HOT HEMOSTASIS (ARGON PLASMA COAGULATION/BICAP);  Surgeon: Rachael Fee, MD;  Location: [facility];  Service: Endoscopy;  Laterality: N/A;   IR IMAGING GUIDED PORT INSERTION  04/25/2020   RADIOACTIVE SEED GUIDED PARTIAL MASTECTOMY WITH AXILLARY SENTINEL LYMPH NODE BIOPSY Right 07/07/2015   Procedure: RIGHT RADIOACTIVE SEED GUIDED PARTIAL MASTECTOMY WITH AXILLARY SENTINEL LYMPH NODE BIOPSY;  Surgeon: Chevis Pretty III, MD;  Location: [facility];  Service: General;  Laterality: Right;   SMALL INTESTINE SURGERY     TUBAL LIGATION     UPPER GASTROINTESTINAL ENDOSCOPY      SOCIAL HISTORY: Social History   Socioeconomic History   Marital status: Divorced    Spouse name: Not on file   Number of children: 7    Years of education: Not on file   Highest education level: Not on file  Occupational History   Occupation: retired  Tobacco Use   Smoking status: Former    Current packs/day: 0.00    Average packs/day: 1 pack/day for 20.0 years (20.0 ttl pk-yrs)    Types: Cigarettes    Start date: 07/30/1991    Quit date: 07/30/2011    Years since quitting: 11.7   Smokeless tobacco: Never   Tobacco comments:    Quit >4 years ago; 1 ppd for about 5/20 years (remaining was less)  Vaping Use   Vaping status: Former  Substance and Sexual Activity   Alcohol use: No    Alcohol/week: 0.0 standard drinks of alcohol   Drug use: No   Sexual activity: Not Currently  Other Topics Concern   Not on file  Social History Narrative   Lives alone.  Retired.  Education:  11th grade GED.  Children:  7 (one here).    Social Determinants of Health   Financial Resource Strain: High Risk (12/26/2022)   Overall Financial Resource Strain (CARDIA)    Difficulty of Paying Living Expenses: Hard  Food Insecurity: No Food Insecurity (03/19/2023)   Hunger Vital Sign    Worried About Running Out of Food in the Last Year: Never true    Ran Out of Food in the Last Year: Never true  Transportation Needs: Unmet Transportation Needs (04/10/2023)   PRAPARE - Administrator, Civil Service (Medical): Yes    Lack of Transportation (Non-Medical): No  Physical Activity: Inactive (12/26/2022)   Exercise Vital Sign    Days of Exercise per Week: 0 days    Minutes of Exercise per Session: 0 min  Stress: Stress Concern Present (12/26/2022)   [employer] of Occupational Health - Occupational Stress Questionnaire    Feeling of Stress : To some extent  Social Connections: Moderately Isolated (12/26/2022)   Social Connection and Isolation Panel [NHANES]    Frequency of Communication with Friends and Family: More than three times a week    Frequency of Social Gatherings with Friends and Family: Once a week    Attends Religious  Services: More than 4 times per year    Active Member of [employer] or Organizations: No    Attends Banker Meetings: Never    Marital Status: Divorced  Catering manager Violence: Not At Risk (12/26/2022)   Humiliation, Afraid, Rape, and Kick questionnaire    Fear of Current or Ex-Partner: No    Emotionally Abused: No    Physically Abused: No    Sexually Abused: No    FAMILY HISTORY: Family History  Problem Relation Age of  Onset   Emphysema Mother 9       smoker   Diabetes Father    Lung cancer Sister        dx. <50; former smoker   Diabetes Brother    Diabetes Brother    Brain cancer Brother 57       unknown tumor type   Diabetes Paternal Aunt    Stroke Maternal Grandmother    Diabetes Paternal Grandmother    Cancer Daughter 45       neck cancer   Other Daughter        hysterectomy for unspecified reason   Colon cancer Daughter    Breast cancer Cousin    Cancer Cousin        unspecified type   Breast cancer Other        triple negative breast cancer in her 27s   Colon polyps Neg Hx    Esophageal cancer Neg Hx    Gallbladder disease Neg Hx    ROS  10 Point review of Systems was done is negative except as noted above.   PHYSICAL EXAMINATION  ECOG PERFORMANCE STATUS: 1 - Symptomatic but completely ambulatory  Vitals:   04/25/23 1059  BP: 135/61  Pulse: 72  Resp: 16  Temp: 98.2 F (36.8 C)  SpO2: 100%     GENERAL:alert, in no acute distress and comfortable SKIN: no acute rashes, no significant lesions EYES: conjunctiva are pink and non-injected, sclera anicteric OROPHARYNX: MMM, no exudates, no oropharyngeal erythema or ulceration NECK: supple, no JVD LYMPH:  no palpable lymphadenopathy in the cervical, axillary or inguinal regions LUNGS: clear to auscultation b/l with normal respiratory effort HEART: regular rate & rhythm ABDOMEN:  normoactive bowel sounds , non tender, not distended. Extremity: no pedal edema PSYCH: alert & oriented x 3  with fluent speech NEURO: no focal motor/sensory deficits   LABORATORY DATA: .    Latest Ref Rng & Units 04/25/2023   10:37 AM 03/28/2023   10:05 AM 03/07/2023   10:09 AM  CBC  WBC 4.0 - 10.5 K/uL 4.8  4.9  5.2   Hemoglobin 12.0 - 15.0 g/dL 52.8  41.3  24.4   Hematocrit 36.0 - 46.0 % 33.9  30.6  30.8   Platelets 150 - 400 K/uL 276  268  258    .    Latest Ref Rng & Units 04/25/2023   10:37 AM 03/28/2023   10:05 AM 03/07/2023   10:09 AM  CMP  Glucose 70 - 99 mg/dL 010  272  536   BUN 8 - 23 mg/dL 12  17  12    Creatinine 0.44 - 1.00 mg/dL 6.44  0.34  7.42   Sodium 135 - 145 mmol/L 142  142  142   Potassium 3.5 - 5.1 mmol/L 4.3  4.0  3.8   Chloride 98 - 111 mmol/L 107  108  107   CO2 22 - 32 mmol/L 27  25  24    Calcium 8.9 - 10.3 mg/dL 9.5  9.1  8.7   Total Protein 6.5 - 8.1 g/dL 7.0  6.5  6.2   Total Bilirubin 0.3 - 1.2 mg/dL 0.3  0.3  0.3   Alkaline Phos 38 - 126 U/L 72  58  61   AST 15 - 41 U/L 14  13  12    ALT 0 - 44 U/L 11  11  9      Mammogram 09/23/2022:   ASSESSMENT and THERAPY PLAN:   77 yo here  for follow-up of her for follow-up of her multiple myeloma   1)  IgG Kappa Multiple myeloma with bone lesions, anemia, renal insuff. M spike @ 3.7g/dl on diagnosis. 1p deletion, polymorphic variant, 13q deletion Multiple myeloma panel from 06/28/2021 with no M spike.  IFE positive for IgG kappa monoclonal protein possibly from her daratumumab. Currently on monthly daratumumab 2) h/o DM2 3) Diabetic Neuropathy 4) CKD - likely from DM2, but could have an element of myeloma kidney. 5) h/o TIA and AMI 6) Iron deficiency 7) B12 deficiency   PLAN:  -Discussed lab results on 04/25/23 in detail with patient. CBC showed WBC of 4.8K, hemoglobin improved to 11.4, and platelets of 276K. -no anemia -Her last CMP was stable. Today's CMP is pending -myeloma panel on 03/07/2023 showed M spike of 0.1. Her M protein continues to fluctuate between 0.1 and 0.2.  -discussed that it is  difficult to know whether the measured M spike is related to the protein that myeloma is making or if it is measuring Darzalex in the blood as it is also an igG kappa antibody -discussed that if her M protein level goes above 0.5, we would continue to monitor labs, and there may be a role for a PET scan or bone marrow exam, though this is not necessary at this time -no notably toxicities from current treatment. -Continue monthly Daratumumab treatment for continued maintenance -continue monthly B12  -Continue taking weekly vitamin D supplements to strengthen immune system and improve bone health -patient may have inflammation in the joints at the junction between her breast bone and ribs.  -recommend patient to stay UTD with age-appropriate vaccinations including flu, COVID-19 booster, RSV, shingrix, and pneumonia -answered all of patient's questions in detail, including potential effects of myeloma, treatment, and her port-a-cath placement -educated patient of potential effects of myeloma including potentially producing a bone tumor, affecting the way the bone marrow makes blood cells, making excessive amounts of abnormal protein which can affect kidney function, growing in the bones and increasing calcium levels, causing potential side affects such as fatigue. -discussed option of port removal if patient chooses to. Patient would like to keep her port placed at this time.   Follow-up: ***  The total time spent in the appointment was *** minutes* .  All of the patient's questions were answered with apparent satisfaction. The patient knows to call the clinic with any problems, questions or concerns.   Wyvonnia Lora MD MS AAHIVMS [facility] Hematology/Oncology Physician [facility]  .*Total Encounter Time as defined by the Centers for Medicare and Medicaid Services includes, in addition to the face-to-face time of a patient visit (documented in the note above) non-face-to-face time:  obtaining and reviewing outside history, ordering and reviewing medications, tests or procedures, care coordination (communications with other health care professionals or caregivers) and documentation in the medical record.    I,Mitra Faeizi,acting as a Neurosurgeon for Wyvonnia Lora, MD.,have documented all relevant documentation on the behalf of Wyvonnia Lora, MD,as directed by  Wyvonnia Lora, MD while in the presence of Wyvonnia Lora, MD.  ***

## 2023-04-25 NOTE — Patient Instructions (Signed)
[facility]  Discharge Instructions: Thank you for choosing [facility] to provide your oncology and hematology care.   If you have a lab appointment with the Cancer Center, please go directly to the Cancer Center and check in at the registration area.   Wear comfortable clothing and clothing appropriate for easy access to any Portacath or PICC line.   We strive to give you quality time with your provider. You may need to reschedule your appointment if you arrive late (15 or more minutes).  Arriving late affects you and other patients whose appointments are after yours.  Also, if you miss three or more appointments without notifying the office, you may be dismissed from the clinic at the provider's discretion.      For prescription refill requests, have your pharmacy contact our office and allow 72 hours for refills to be completed.    Today you received the following chemotherapy and/or immunotherapy agents: Darzalex    To help prevent nausea and vomiting after your treatment, we encourage you to take your nausea medication as directed.  BELOW ARE SYMPTOMS THAT SHOULD BE REPORTED IMMEDIATELY: *FEVER GREATER THAN 100.4 F (38 C) OR HIGHER *CHILLS OR SWEATING *NAUSEA AND VOMITING THAT IS NOT CONTROLLED WITH YOUR NAUSEA MEDICATION *UNUSUAL SHORTNESS OF BREATH *UNUSUAL BRUISING OR BLEEDING *URINARY PROBLEMS (pain or burning when urinating, or frequent urination) *BOWEL PROBLEMS (unusual diarrhea, constipation, pain near the anus) TENDERNESS IN MOUTH AND THROAT WITH OR WITHOUT PRESENCE OF ULCERS (sore throat, sores in mouth, or a toothache) UNUSUAL RASH, SWELLING OR PAIN  UNUSUAL VAGINAL DISCHARGE OR ITCHING   Items with * indicate a potential emergency and should be followed up as soon as possible or go to the Emergency Department if any problems should occur.  Please show the CHEMOTHERAPY ALERT CARD or IMMUNOTHERAPY ALERT CARD at check-in  to the Emergency Department and triage nurse.  Should you have questions after your visit or need to cancel or reschedule your appointment, please contact [facility]  Dept: [phone]  and follow the prompts.  Office hours are 8:00 a.m. to 4:30 p.m. Monday - Friday. Please note that voicemails left after 4:00 p.m. may not be returned until the following business day.  We are closed weekends and major holidays. You have access to a nurse at all times for urgent questions. Please call the main number to the clinic Dept: [phone] and follow the prompts.   For any non-urgent questions, you may also contact your provider using MyChart. We now offer e-Visits for anyone 18 and older to request care online for non-urgent symptoms. For details visit [email or web address].   Also download the MyChart app! Go to the app store, search "MyChart", open the app, select [facility], and log in with your MyChart username and password.   

## 2023-04-26 ENCOUNTER — Other Ambulatory Visit: Payer: Self-pay

## 2023-04-29 LAB — MULTIPLE MYELOMA PANEL, SERUM
Albumin SerPl Elph-Mcnc: 3.6 g/dL (ref 2.9–4.4)
Albumin/Glob SerPl: 1.3 (ref 0.7–1.7)
Alpha 1: 0.2 g/dL (ref 0.0–0.4)
Alpha2 Glob SerPl Elph-Mcnc: 1.1 g/dL — ABNORMAL HIGH (ref 0.4–1.0)
B-Globulin SerPl Elph-Mcnc: 1.1 g/dL (ref 0.7–1.3)
Gamma Glob SerPl Elph-Mcnc: 0.4 g/dL (ref 0.4–1.8)
Globulin, Total: 2.8 g/dL (ref 2.2–3.9)
IgA: 65 mg/dL (ref 64–422)
IgG (Immunoglobin G), Serum: 451 mg/dL — ABNORMAL LOW (ref 586–1602)
IgM (Immunoglobulin M), Srm: 48 mg/dL (ref 26–217)
Total Protein ELP: 6.4 g/dL (ref 6.0–8.5)

## 2023-04-30 ENCOUNTER — Encounter: Payer: Self-pay | Admitting: Family Medicine

## 2023-04-30 ENCOUNTER — Ambulatory Visit: Payer: Medicare Other | Admitting: Family Medicine

## 2023-04-30 VITALS — BP 132/66 | HR 72 | Temp 97.8°F | Wt 185.4 lb

## 2023-04-30 DIAGNOSIS — M62830 Muscle spasm of back: Secondary | ICD-10-CM

## 2023-04-30 DIAGNOSIS — R0789 Other chest pain: Secondary | ICD-10-CM | POA: Diagnosis not present

## 2023-04-30 NOTE — Patient Instructions (Signed)
Follow up as needed or as scheduled RESTART the Meloxicam once daily- take w/ food TAKE Tylenol (Acetaminophen) 3 times a day w/ each meal USE the Tizanidine nightly for spasm HEAT the neck and shoulder muscles ICE the chest muscles Call with any questions or concerns Hang in there!!! Happy Fall!!

## 2023-04-30 NOTE — Progress Notes (Signed)
Subjective:    Patient ID: Tracy [name], female    DOB: [birth year], 77 y.o.   MRN: [id number]  HPI L sided head pain- 'it feels like I have a cap on my head'.  pt reports pain will either start at back of head on L side or it can start in the neck.  Pain will radiate down into L arm at times.  Pain can either be sharp or dull.  Currently feels a 'pressure like ache' behind L eye.  Sxs started Sunday.  Did not sleep well Saturday night and sxs developed the next morning.  Sxs will come and go spontaneously.  No relief w/ OTC meds.  L chest tenderness- chest wall is TTP.  At times feels swollen.  Can push on chest wall and cause a shooting sensation up into neck and head.  Can also radiate to L arm.  No SOB.  Denies chest pressure.   Review of Systems For ROS see HPI     Objective:   Physical Exam Vitals reviewed.  Constitutional:      General: She is not in acute distress.    Appearance: Normal appearance. She is not ill-appearing.  HENT:     Head: Normocephalic and atraumatic.  Cardiovascular:     Rate and Rhythm: Normal rate and regular rhythm.  Pulmonary:     Effort: Pulmonary effort is normal. No respiratory distress.     Breath sounds: No wheezing or rhonchi.  Chest:     Chest wall: Tenderness (over L pec) present. No mass or deformity.    Musculoskeletal:        General: Tenderness (TTP over L pec and L upper trap) present.     Cervical back: Tenderness (over L trap, obvious spasm) present.  Skin:    General: Skin is warm and dry.     Findings: No bruising or rash.  Neurological:     General: No focal deficit present.     Mental Status: She is alert and oriented to person, place, and time.           Assessment & Plan:  L trap spasm- new.  Pt had similar sxs on R that resolved w/ Meloxicam and Tizanidine.  Will restart both.  Encouraged heat.  Discussed that this trap spasm is causing the pain in her neck and head.  Reviewed supportive care and red flags that  should prompt return.  Pt expressed understanding and is in agreement w/ plan.   L chest wall pain- new.  Reproducible w/ palpation of L pec.  Encouraged her to ice the area.  Restart Meloxicam.  Reviewed supportive care and red flags that should prompt return.  Pt expressed understanding and is in agreement w/ plan.

## 2023-05-01 ENCOUNTER — Encounter: Payer: Self-pay | Admitting: Hematology

## 2023-05-01 ENCOUNTER — Ambulatory Visit: Payer: Self-pay | Admitting: Licensed Clinical Social Worker

## 2023-05-02 ENCOUNTER — Inpatient Hospital Stay: Payer: Medicare Other | Attending: Hematology

## 2023-05-02 ENCOUNTER — Other Ambulatory Visit: Payer: Self-pay

## 2023-05-02 ENCOUNTER — Inpatient Hospital Stay: Payer: Medicare Other

## 2023-05-02 VITALS — BP 121/51 | HR 70 | Resp 16

## 2023-05-02 DIAGNOSIS — Z800 Family history of malignant neoplasm of digestive organs: Secondary | ICD-10-CM | POA: Diagnosis not present

## 2023-05-02 DIAGNOSIS — Z803 Family history of malignant neoplasm of breast: Secondary | ICD-10-CM | POA: Diagnosis not present

## 2023-05-02 DIAGNOSIS — G629 Polyneuropathy, unspecified: Secondary | ICD-10-CM | POA: Insufficient documentation

## 2023-05-02 DIAGNOSIS — E538 Deficiency of other specified B group vitamins: Secondary | ICD-10-CM | POA: Diagnosis not present

## 2023-05-02 DIAGNOSIS — Z90710 Acquired absence of both cervix and uterus: Secondary | ICD-10-CM | POA: Insufficient documentation

## 2023-05-02 DIAGNOSIS — Z8673 Personal history of transient ischemic attack (TIA), and cerebral infarction without residual deficits: Secondary | ICD-10-CM | POA: Diagnosis not present

## 2023-05-02 DIAGNOSIS — Z95828 Presence of other vascular implants and grafts: Secondary | ICD-10-CM

## 2023-05-02 DIAGNOSIS — Z853 Personal history of malignant neoplasm of breast: Secondary | ICD-10-CM | POA: Insufficient documentation

## 2023-05-02 DIAGNOSIS — N189 Chronic kidney disease, unspecified: Secondary | ICD-10-CM | POA: Insufficient documentation

## 2023-05-02 DIAGNOSIS — I252 Old myocardial infarction: Secondary | ICD-10-CM | POA: Diagnosis not present

## 2023-05-02 DIAGNOSIS — R110 Nausea: Secondary | ICD-10-CM | POA: Diagnosis not present

## 2023-05-02 DIAGNOSIS — I2510 Atherosclerotic heart disease of native coronary artery without angina pectoris: Secondary | ICD-10-CM | POA: Insufficient documentation

## 2023-05-02 DIAGNOSIS — Z923 Personal history of irradiation: Secondary | ICD-10-CM | POA: Insufficient documentation

## 2023-05-02 DIAGNOSIS — E1140 Type 2 diabetes mellitus with diabetic neuropathy, unspecified: Secondary | ICD-10-CM | POA: Diagnosis not present

## 2023-05-02 DIAGNOSIS — I129 Hypertensive chronic kidney disease with stage 1 through stage 4 chronic kidney disease, or unspecified chronic kidney disease: Secondary | ICD-10-CM | POA: Insufficient documentation

## 2023-05-02 DIAGNOSIS — Z5112 Encounter for antineoplastic immunotherapy: Secondary | ICD-10-CM | POA: Insufficient documentation

## 2023-05-02 DIAGNOSIS — C9000 Multiple myeloma not having achieved remission: Secondary | ICD-10-CM | POA: Insufficient documentation

## 2023-05-02 DIAGNOSIS — Z87891 Personal history of nicotine dependence: Secondary | ICD-10-CM | POA: Insufficient documentation

## 2023-05-02 DIAGNOSIS — Z801 Family history of malignant neoplasm of trachea, bronchus and lung: Secondary | ICD-10-CM | POA: Diagnosis not present

## 2023-05-02 DIAGNOSIS — E1122 Type 2 diabetes mellitus with diabetic chronic kidney disease: Secondary | ICD-10-CM | POA: Diagnosis not present

## 2023-05-02 DIAGNOSIS — G4733 Obstructive sleep apnea (adult) (pediatric): Secondary | ICD-10-CM | POA: Diagnosis not present

## 2023-05-02 DIAGNOSIS — D509 Iron deficiency anemia, unspecified: Secondary | ICD-10-CM | POA: Diagnosis not present

## 2023-05-02 DIAGNOSIS — Z79899 Other long term (current) drug therapy: Secondary | ICD-10-CM | POA: Insufficient documentation

## 2023-05-02 DIAGNOSIS — G2581 Restless legs syndrome: Secondary | ICD-10-CM | POA: Insufficient documentation

## 2023-05-02 DIAGNOSIS — C7951 Secondary malignant neoplasm of bone: Secondary | ICD-10-CM

## 2023-05-02 MED ORDER — HEPARIN SOD (PORK) LOCK FLUSH 100 UNIT/ML IV SOLN
500.0000 [IU] | Freq: Once | INTRAVENOUS | Status: AC
  Administered 2023-05-02: 500 [IU]

## 2023-05-02 MED ORDER — SODIUM CHLORIDE 0.9% FLUSH
10.0000 mL | Freq: Once | INTRAVENOUS | Status: AC
  Administered 2023-05-02: 10 mL

## 2023-05-02 MED ORDER — SODIUM CHLORIDE 0.9 % IV SOLN
60.0000 mg | Freq: Once | INTRAVENOUS | Status: AC
  Administered 2023-05-02: 60 mg via INTRAVENOUS
  Filled 2023-05-02: qty 20

## 2023-05-02 MED ORDER — SODIUM CHLORIDE 0.9 % IV SOLN: INTRAVENOUS | Status: DC

## 2023-05-02 NOTE — Patient Outreach (Signed)
Care Coordination   Follow Up Visit Note   05/01/2023 Name: TAVARES ROEDERER MRN: [id number] DOB: 1946-07-07  Real Cons Virgil is a 77 y.o. year old female who sees [name], Helane Rima, MD for primary care. I spoke with  Real Cons Fosdick by phone today.  What matters to the patients health and wellness today?  Symptom Management    Goals Addressed             This Visit's Progress    LCSW-Plan of Care   On track    Activities and task to complete in order to accomplish goals.   Keep all upcoming appointments discussed today Continue with compliance of taking medication prescribed by Doctor Implement healthy coping skills discussed to assist with management of symptoms         SDOH assessments and interventions completed:  No     Care Coordination Interventions:  Yes, provided  Interventions Today    Flowsheet Row Most Recent Value  Chronic Disease   Chronic disease during today's visit Hypertension (HTN), Diabetes, Other  [GAD]  General Interventions   General Interventions Discussed/Reviewed General Interventions Reviewed, Doctor Visits  Doctor Visits Discussed/Reviewed Doctor Visits Reviewed  Mental Health Interventions   Mental Health Discussed/Reviewed Mental Health Reviewed, Grief and Loss, Coping Strategies, Anxiety, Depression       Follow up plan: Follow up call scheduled for 4-6 weeks    Encounter Outcome:  Patient Visit Completed   Jenel Lucks, MSW, LCSW [facility] Care Management [facility]  [facility] [email or web address] Phone [phone] 8:11 AM

## 2023-05-02 NOTE — Patient Instructions (Signed)
Visit Information  Thank you for taking time to visit with me today. Please don't hesitate to contact me if I can be of assistance to you.   Following are the goals we discussed today:   Goals Addressed             This Visit's Progress    LCSW-Plan of Care   On track    Activities and task to complete in order to accomplish goals.   Keep all upcoming appointments discussed today Continue with compliance of taking medication prescribed by Doctor Implement healthy coping skills discussed to assist with management of symptoms         Our next appointment is by telephone on 11/14 at 1 PM  Please call the care guide team at [phone] if you need to cancel or reschedule your appointment.   If you are experiencing a Mental Health or Behavioral Health Crisis or need someone to talk to, please call the Suicide and Crisis Lifeline: 988 call 911   Patient verbalizes understanding of instructions and care plan provided today and agrees to view in MyChart. Active MyChart status and patient understanding of how to access instructions and care plan via MyChart confirmed with patient.     Jenel Lucks, MSW, LCSW [facility] Care Management [place]  [facility] [email or web address] Phone [phone] 8:11 AM

## 2023-05-02 NOTE — Patient Instructions (Signed)
Pamidronate Injection What is this medication? PAMIDRONATE (pa mi DROE nate) treats high calcium levels in the blood caused by cancer. It may also be used with chemotherapy to treat weakened bones caused by cancer. It can also be used to treat Paget's disease of the bone. It works by slowing down the release of calcium from bones. This lowers calcium levels in your blood. It also makes your bones stronger and less likely to break (fracture). It belongs to a group of medications called bisphosphonates. This medicine may be used for other purposes; ask your health care provider or pharmacist if you have questions. COMMON BRAND NAME(S): Aredia What should I tell my care team before I take this medication? They need to know if you have any of these conditions: Bleeding disorder Cancer Dental disease Kidney disease Low levels of calcium or other minerals in the blood Low red blood cell counts Receiving steroids, such as dexamethasone or prednisone An unusual or allergic reaction to pamidronate, other medications, foods, dyes or preservatives Pregnant or trying to get pregnant Breast-feeding How should I use this medication? This medication is injected into a vein. It is given by your care team in a hospital or clinic setting. Talk to your care team about the use of this medication in children. Special care may be needed. Overdosage: If you think you have taken too much of this medicine contact a poison control center or emergency room at once. NOTE: This medicine is only for you. Do not share this medicine with others. What if I miss a dose? Keep appointments for follow-up doses. It is important not to miss your dose. Call your care team if you are unable to keep an appointment. What may interact with this medication? Certain antibiotics given by injection Medications for inflammation or pain, such as ibuprofen, naproxen Some diuretics, such as bumetanide, furosemide Cyclosporine Parathyroid  hormone Tacrolimus Teriparatide Thalidomide This list may not describe all possible interactions. Give your health care provider a list of all the medicines, herbs, non-prescription drugs, or dietary supplements you use. Also tell them if you smoke, drink alcohol, or use illegal drugs. Some items may interact with your medicine. What should I watch for while using this medication? Visit your care team for regular checks on your progress. It may be some time before you see the benefit from this medication. Some people who take this medication have severe bone, joint, or muscle pain. This medication may also increase your risk for jaw problems or a broken thigh bone. Tell your care team right away if you have severe pain in your jaw, bones, joints, or muscles. Tell your care team if you have any pain that does not go away or that gets worse. Tell your dentist and dental surgeon that you are taking this medication. You should not have major dental surgery while on this medication. See your dentist to have a dental exam and fix any dental problems before starting this medication. Take good care of your teeth while on this medication. Make sure you see your dentist for regular follow-up appointments. You should make sure you get enough calcium and vitamin D while you are taking this medication. Discuss the foods you eat and the vitamins you take with your care team. You may need bloodwork while you are taking this medication. Talk to your care team if you wish to become pregnant or think you might be pregnant. This medication can cause serious birth defects. What side effects may I notice from receiving  this medication? Side effects that you should report to your care team as soon as possible: Allergic reactions--skin rash, itching, hives, swelling of the face, lips, tongue, or throat Kidney injury--decrease in the amount of urine, swelling of the ankles, hands, or feet Low calcium level--muscle pain or  cramps, confusion, tingling, or numbness in the hands or feet Osteonecrosis of the jaw--pain, swelling, or redness in the mouth, numbness of the jaw, poor healing after dental work, unusual discharge from the mouth, visible bones in the mouth Severe bone, joint, or muscle pain Side effects that usually do not require medical attention (report to your care team if they continue or are bothersome): Constipation Fatigue Fever Loss of appetite Nausea Pain, redness, or irritation at injection site Stomach pain This list may not describe all possible side effects. Call your doctor for medical advice about side effects. You may report side effects to FDA at 1-800-FDA-1088. Where should I keep my medication? This medication is given in a hospital or clinic. It will not be stored at home. NOTE: This sheet is a summary. It may not cover all possible information. If you have questions about this medicine, talk to your doctor, pharmacist, or health care provider.  2024 Elsevier/Gold Standard (2021-09-03 00:00:00)

## 2023-05-02 NOTE — Progress Notes (Signed)
Per Dr Candise Che pt is ok to tx today with labs from 04/25/23

## 2023-05-04 ENCOUNTER — Other Ambulatory Visit: Payer: Self-pay

## 2023-05-08 ENCOUNTER — Other Ambulatory Visit: Payer: Self-pay

## 2023-05-17 ENCOUNTER — Other Ambulatory Visit: Payer: Self-pay

## 2023-05-23 ENCOUNTER — Inpatient Hospital Stay: Payer: Medicare Other

## 2023-05-23 VITALS — BP 151/77 | HR 85 | Temp 98.4°F | Resp 17 | Wt 180.5 lb

## 2023-05-23 DIAGNOSIS — G2581 Restless legs syndrome: Secondary | ICD-10-CM | POA: Diagnosis not present

## 2023-05-23 DIAGNOSIS — Z5112 Encounter for antineoplastic immunotherapy: Secondary | ICD-10-CM | POA: Diagnosis not present

## 2023-05-23 DIAGNOSIS — G4733 Obstructive sleep apnea (adult) (pediatric): Secondary | ICD-10-CM | POA: Diagnosis not present

## 2023-05-23 DIAGNOSIS — G629 Polyneuropathy, unspecified: Secondary | ICD-10-CM | POA: Diagnosis not present

## 2023-05-23 DIAGNOSIS — N189 Chronic kidney disease, unspecified: Secondary | ICD-10-CM | POA: Diagnosis not present

## 2023-05-23 DIAGNOSIS — C7951 Secondary malignant neoplasm of bone: Secondary | ICD-10-CM

## 2023-05-23 DIAGNOSIS — C9000 Multiple myeloma not having achieved remission: Secondary | ICD-10-CM

## 2023-05-23 DIAGNOSIS — E1122 Type 2 diabetes mellitus with diabetic chronic kidney disease: Secondary | ICD-10-CM | POA: Diagnosis not present

## 2023-05-23 DIAGNOSIS — D509 Iron deficiency anemia, unspecified: Secondary | ICD-10-CM | POA: Diagnosis not present

## 2023-05-23 DIAGNOSIS — Z7189 Other specified counseling: Secondary | ICD-10-CM

## 2023-05-23 DIAGNOSIS — Z8673 Personal history of transient ischemic attack (TIA), and cerebral infarction without residual deficits: Secondary | ICD-10-CM | POA: Diagnosis not present

## 2023-05-23 DIAGNOSIS — E538 Deficiency of other specified B group vitamins: Secondary | ICD-10-CM | POA: Diagnosis not present

## 2023-05-23 DIAGNOSIS — I252 Old myocardial infarction: Secondary | ICD-10-CM | POA: Diagnosis not present

## 2023-05-23 DIAGNOSIS — Z79899 Other long term (current) drug therapy: Secondary | ICD-10-CM | POA: Diagnosis not present

## 2023-05-23 DIAGNOSIS — R110 Nausea: Secondary | ICD-10-CM | POA: Diagnosis not present

## 2023-05-23 DIAGNOSIS — I129 Hypertensive chronic kidney disease with stage 1 through stage 4 chronic kidney disease, or unspecified chronic kidney disease: Secondary | ICD-10-CM | POA: Diagnosis not present

## 2023-05-23 DIAGNOSIS — Z800 Family history of malignant neoplasm of digestive organs: Secondary | ICD-10-CM | POA: Diagnosis not present

## 2023-05-23 DIAGNOSIS — Z87891 Personal history of nicotine dependence: Secondary | ICD-10-CM | POA: Diagnosis not present

## 2023-05-23 DIAGNOSIS — Z803 Family history of malignant neoplasm of breast: Secondary | ICD-10-CM | POA: Diagnosis not present

## 2023-05-23 DIAGNOSIS — I2510 Atherosclerotic heart disease of native coronary artery without angina pectoris: Secondary | ICD-10-CM | POA: Diagnosis not present

## 2023-05-23 DIAGNOSIS — Z853 Personal history of malignant neoplasm of breast: Secondary | ICD-10-CM | POA: Diagnosis not present

## 2023-05-23 DIAGNOSIS — Z801 Family history of malignant neoplasm of trachea, bronchus and lung: Secondary | ICD-10-CM | POA: Diagnosis not present

## 2023-05-23 DIAGNOSIS — Z923 Personal history of irradiation: Secondary | ICD-10-CM | POA: Diagnosis not present

## 2023-05-23 DIAGNOSIS — Z95828 Presence of other vascular implants and grafts: Secondary | ICD-10-CM

## 2023-05-23 DIAGNOSIS — E1140 Type 2 diabetes mellitus with diabetic neuropathy, unspecified: Secondary | ICD-10-CM | POA: Diagnosis not present

## 2023-05-23 LAB — CBC WITH DIFFERENTIAL (CANCER CENTER ONLY)
Abs Immature Granulocytes: 0.02 K/uL (ref 0.00–0.07)
Basophils Absolute: 0.1 K/uL (ref 0.0–0.1)
Basophils Relative: 1 %
Eosinophils Absolute: 0.1 K/uL (ref 0.0–0.5)
Eosinophils Relative: 1 %
HCT: 31.3 % — ABNORMAL LOW (ref 36.0–46.0)
Hemoglobin: 10.6 g/dL — ABNORMAL LOW (ref 12.0–15.0)
Immature Granulocytes: 0 %
Lymphocytes Relative: 36 %
Lymphs Abs: 1.8 K/uL (ref 0.7–4.0)
MCH: 31.6 pg (ref 26.0–34.0)
MCHC: 33.9 g/dL (ref 30.0–36.0)
MCV: 93.4 fL (ref 80.0–100.0)
Monocytes Absolute: 0.3 K/uL (ref 0.1–1.0)
Monocytes Relative: 6 %
Neutro Abs: 2.7 K/uL (ref 1.7–7.7)
Neutrophils Relative %: 56 %
Platelet Count: 305 K/uL (ref 150–400)
RBC: 3.35 MIL/uL — ABNORMAL LOW (ref 3.87–5.11)
RDW: 13.2 % (ref 11.5–15.5)
WBC Count: 5 K/uL (ref 4.0–10.5)
nRBC: 0 % (ref 0.0–0.2)

## 2023-05-23 LAB — CMP (CANCER CENTER ONLY)
ALT: 10 U/L (ref 0–44)
AST: 13 U/L — ABNORMAL LOW (ref 15–41)
Albumin: 4.3 g/dL (ref 3.5–5.0)
Alkaline Phosphatase: 71 U/L (ref 38–126)
Anion gap: 10 (ref 5–15)
BUN: 14 mg/dL (ref 8–23)
CO2: 24 mmol/L (ref 22–32)
Calcium: 9.2 mg/dL (ref 8.9–10.3)
Chloride: 109 mmol/L (ref 98–111)
Creatinine: 1.29 mg/dL — ABNORMAL HIGH (ref 0.44–1.00)
GFR, Estimated: 42 mL/min — ABNORMAL LOW (ref >60)
Glucose, Bld: 121 mg/dL — ABNORMAL HIGH (ref 70–99)
Potassium: 4.2 mmol/L (ref 3.5–5.1)
Sodium: 143 mmol/L (ref 135–145)
Total Bilirubin: 0.2 mg/dL — ABNORMAL LOW (ref 0.3–1.2)
Total Protein: 6.7 g/dL (ref 6.5–8.1)

## 2023-05-23 MED ORDER — HEPARIN SOD (PORK) LOCK FLUSH 100 UNIT/ML IV SOLN
500.0000 [IU] | Freq: Once | INTRAVENOUS | Status: AC | PRN
  Administered 2023-05-23: 500 [IU]

## 2023-05-23 MED ORDER — DIPHENHYDRAMINE HCL 25 MG PO CAPS
50.0000 mg | ORAL_CAPSULE | Freq: Once | ORAL | Status: AC
  Administered 2023-05-23: 50 mg via ORAL
  Filled 2023-05-23: qty 2

## 2023-05-23 MED ORDER — DARATUMUMAB CHEMO INJECTION 400 MG/20ML
16.0000 mg/kg | Freq: Once | INTRAVENOUS | Status: AC
  Administered 2023-05-23: 1300 mg via INTRAVENOUS
  Filled 2023-05-23: qty 5

## 2023-05-23 MED ORDER — FAMOTIDINE IN NACL 20-0.9 MG/50ML-% IV SOLN
20.0000 mg | Freq: Once | INTRAVENOUS | Status: AC
  Administered 2023-05-23: 20 mg via INTRAVENOUS
  Filled 2023-05-23: qty 50

## 2023-05-23 MED ORDER — ACETAMINOPHEN 325 MG PO TABS
650.0000 mg | ORAL_TABLET | Freq: Once | ORAL | Status: AC
  Administered 2023-05-23: 650 mg via ORAL
  Filled 2023-05-23: qty 2

## 2023-05-23 MED ORDER — CYANOCOBALAMIN 1000 MCG/ML IJ SOLN
1000.0000 ug | Freq: Once | INTRAMUSCULAR | Status: AC
  Administered 2023-05-23: 1000 ug via INTRAMUSCULAR
  Filled 2023-05-23: qty 1

## 2023-05-23 MED ORDER — SODIUM CHLORIDE 0.9 % IV SOLN: Freq: Once | INTRAVENOUS | Status: AC

## 2023-05-23 MED ORDER — SODIUM CHLORIDE 0.9% FLUSH
10.0000 mL | Freq: Once | INTRAVENOUS | Status: AC
Start: 2023-05-23 — End: 2023-05-23
  Administered 2023-05-23: 10 mL

## 2023-05-23 MED ORDER — SODIUM CHLORIDE 0.9 % IV SOLN
16.0000 mg | Freq: Once | INTRAVENOUS | Status: AC
  Administered 2023-05-23: 16 mg via INTRAVENOUS
  Filled 2023-05-23: qty 1.6

## 2023-05-23 MED ORDER — SODIUM CHLORIDE 0.9% FLUSH
10.0000 mL | INTRAVENOUS | Status: DC | PRN
  Administered 2023-05-23: 10 mL

## 2023-05-23 NOTE — Patient Instructions (Signed)
[facility]  Discharge Instructions: Thank you for choosing [facility] to provide your oncology and hematology care.   If you have a lab appointment with the Cancer Center, please go directly to the Cancer Center and check in at the registration area.   Wear comfortable clothing and clothing appropriate for easy access to any Portacath or PICC line.   We strive to give you quality time with your provider. You may need to reschedule your appointment if you arrive late (15 or more minutes).  Arriving late affects you and other patients whose appointments are after yours.  Also, if you miss three or more appointments without notifying the office, you may be dismissed from the clinic at the provider's discretion.      For prescription refill requests, have your pharmacy contact our office and allow 72 hours for refills to be completed.    Today you received the following chemotherapy and/or immunotherapy agents: Darzalex    To help prevent nausea and vomiting after your treatment, we encourage you to take your nausea medication as directed.  BELOW ARE SYMPTOMS THAT SHOULD BE REPORTED IMMEDIATELY: *FEVER GREATER THAN 100.4 F (38 C) OR HIGHER *CHILLS OR SWEATING *NAUSEA AND VOMITING THAT IS NOT CONTROLLED WITH YOUR NAUSEA MEDICATION *UNUSUAL SHORTNESS OF BREATH *UNUSUAL BRUISING OR BLEEDING *URINARY PROBLEMS (pain or burning when urinating, or frequent urination) *BOWEL PROBLEMS (unusual diarrhea, constipation, pain near the anus) TENDERNESS IN MOUTH AND THROAT WITH OR WITHOUT PRESENCE OF ULCERS (sore throat, sores in mouth, or a toothache) UNUSUAL RASH, SWELLING OR PAIN  UNUSUAL VAGINAL DISCHARGE OR ITCHING   Items with * indicate a potential emergency and should be followed up as soon as possible or go to the Emergency Department if any problems should occur.  Please show the CHEMOTHERAPY ALERT CARD or IMMUNOTHERAPY ALERT CARD at check-in  to the Emergency Department and triage nurse.  Should you have questions after your visit or need to cancel or reschedule your appointment, please contact [facility]  Dept: [phone]  and follow the prompts.  Office hours are 8:00 a.m. to 4:30 p.m. Monday - Friday. Please note that voicemails left after 4:00 p.m. may not be returned until the following business day.  We are closed weekends and major holidays. You have access to a nurse at all times for urgent questions. Please call the main number to the clinic Dept: [phone] and follow the prompts.   For any non-urgent questions, you may also contact your provider using MyChart. We now offer e-Visits for anyone 18 and older to request care online for non-urgent symptoms. For details visit [email or web address].   Also download the MyChart app! Go to the app store, search "MyChart", open the app, select [facility], and log in with your MyChart username and password.   

## 2023-05-28 LAB — MULTIPLE MYELOMA PANEL, SERUM
Albumin SerPl Elph-Mcnc: 3.6 g/dL (ref 2.9–4.4)
Albumin/Glob SerPl: 1.4 (ref 0.7–1.7)
Alpha 1: 0.2 g/dL (ref 0.0–0.4)
Alpha2 Glob SerPl Elph-Mcnc: 1.1 g/dL — ABNORMAL HIGH (ref 0.4–1.0)
B-Globulin SerPl Elph-Mcnc: 0.9 g/dL (ref 0.7–1.3)
Gamma Glob SerPl Elph-Mcnc: 0.5 g/dL (ref 0.4–1.8)
Globulin, Total: 2.7 g/dL (ref 2.2–3.9)
IgA: 63 mg/dL — ABNORMAL LOW (ref 64–422)
IgG (Immunoglobin G), Serum: 438 mg/dL — ABNORMAL LOW (ref 586–1602)
IgM (Immunoglobulin M), Srm: 46 mg/dL (ref 26–217)
M Protein SerPl Elph-Mcnc: 0.2 g/dL — ABNORMAL HIGH
Total Protein ELP: 6.3 g/dL (ref 6.0–8.5)

## 2023-05-30 ENCOUNTER — Emergency Department (HOSPITAL_COMMUNITY): Payer: Medicare Other

## 2023-05-30 ENCOUNTER — Emergency Department (HOSPITAL_COMMUNITY)
Admission: EM | Admit: 2023-05-30 | Discharge: 2023-05-30 | Disposition: A | Discharge status: Home / Self Care | Payer: Medicare Other | Attending: Emergency Medicine | Admitting: Emergency Medicine

## 2023-05-30 ENCOUNTER — Encounter (HOSPITAL_COMMUNITY): Payer: Self-pay | Admitting: Emergency Medicine

## 2023-05-30 DIAGNOSIS — M25562 Pain in left knee: Secondary | ICD-10-CM | POA: Insufficient documentation

## 2023-05-30 DIAGNOSIS — Z91040 Latex allergy status: Secondary | ICD-10-CM | POA: Diagnosis not present

## 2023-05-30 DIAGNOSIS — Z7984 Long term (current) use of oral hypoglycemic drugs: Secondary | ICD-10-CM | POA: Insufficient documentation

## 2023-05-30 DIAGNOSIS — M25462 Effusion, left knee: Secondary | ICD-10-CM | POA: Diagnosis not present

## 2023-05-30 DIAGNOSIS — M7652 Patellar tendinitis, left knee: Secondary | ICD-10-CM | POA: Diagnosis not present

## 2023-05-30 DIAGNOSIS — M1712 Unilateral primary osteoarthritis, left knee: Secondary | ICD-10-CM | POA: Diagnosis not present

## 2023-05-30 DIAGNOSIS — E119 Type 2 diabetes mellitus without complications: Secondary | ICD-10-CM | POA: Diagnosis not present

## 2023-05-30 NOTE — ED Provider Notes (Signed)
[place] EMERGENCY DEPARTMENT AT [facility] Provider Note   CSN: [id number] Arrival date & time: 05/30/23  1516     History  Chief Complaint  Patient presents with   Knee Pain    Tracy [name] is a 77 y.o. female.  Patient is a 77 year old female with a past medical history of diabetes and hyperlipidemia presenting to the emergency department with left knee pain.  Patient states that around a week ago when she was walking she felt a pop in her left knee.  She states that she has had increased pain in her knee since then and states that she has noticed a little bit of swelling.  She states that today when she tried to get up but felt like her knee was going to give out on her which prompted her to come to the emergency department.  She states that she has not fallen onto her knee.  Denies any numbness or weakness.  She states that she has not been taking anything for pain at home because "she wants to know when she is having the pain and where it is".  The history is provided by the patient.  Knee Pain      Home Medications Prior to Admission medications   Medication Sig Start Date End Date Taking? Authorizing Provider  acyclovir (ZOVIRAX) 400 MG tablet TAKE 1 TABLET BY MOUTH TWICE  DAILY 12/02/22   Johney Maine, MD  atorvastatin (LIPITOR) 10 MG tablet TAKE 1 TABLET BY MOUTH DAILY 01/27/23   [name], MD  azelastine (ASTELIN) 0.1 % nasal spray Place 1 spray into both nostrils 2 (two) times daily. Use in each nostril as directed 05/29/21   Janeece Agee, NP  gabapentin (NEURONTIN) 300 MG capsule Take 2 capsules (600 mg total) by mouth 3 (three) times daily. 01/31/23 04/25/24  [name], MD  glucose blood (ACCU-CHEK GUIDE) test strip Use as instructed to check sugars 1-2 times daily. 02/10/20   [name], MD  HYDROcodone-acetaminophen (NORCO/VICODIN) 5-325 MG tablet Take 1 tablet by mouth every 6 (six) hours as needed. 03/16/23   Wynetta Fines, MD  lidocaine (LIDODERM) 5 % Place 1 patch onto the skin daily. Remove & Discard patch within 12 hours or as directed by MD 03/18/23   [name], MD  lidocaine-prilocaine (EMLA) cream Apply 1 Application topically as needed. 09/13/22   Johney Maine, MD  loratadine (CLARITIN) 10 MG tablet Take 10 mg by mouth daily.    [provider]  meclizine (ANTIVERT) 25 MG tablet Take 1 tablet (25 mg total) by mouth 3 (three) times daily as needed for dizziness. 03/08/21   Loa Socks, NP  metFORMIN (GLUCOPHAGE) 500 MG tablet TAKE 1 TABLET BY MOUTH  TWICE DAILY WITH A MEAL 02/13/23   [name], MD  ondansetron (ZOFRAN) 8 MG tablet Take 1 tablet (8 mg total) by mouth every 8 (eight) hours as needed for nausea or vomiting. 01/31/23   Briant Cedar, PA-C  pantoprazole (PROTONIX) 40 MG tablet TAKE 1 TABLET BY MOUTH TWICE  DAILY 10/28/22   [name], MD  tiZANidine (ZANAFLEX) 4 MG tablet TAKE 1 TABLET BY MOUTH AT  BEDTIME 04/10/22   [name], MD  traZODone (DESYREL) 100 MG tablet TAKE 1 TABLET BY MOUTH AT  BEDTIME 04/01/23   [name], MD  Vitamin D, Ergocalciferol, (DRISDOL) 1.25 MG (50000 UNIT) CAPS capsule TAKE 1 CAPSULE BY MOUTH ONCE  WEEKLY 11/06/22   Johney Maine, MD      Allergies    Bacitracin-neomycin-polymyxin  [neomycin-bacitracin zn-polymyx], Nsaids, Tape, Ambien [zolpidem tartrate], Amoxicillin, Clavulanic acid, Contrast media [iodinated contrast media], Latex, Prednisone, and Tessalon [benzonatate]    Review of Systems   Review of Systems  Physical Exam Updated Vital Signs BP (!) 162/90 (BP Location: Right Arm)   Pulse 74   Temp 98.1 F (36.7 C) (Oral)   Resp 18   Ht 5' (1.524 m) Comment: Simultaneous filing. User may not have seen previous data.  Wt 81.6 kg Comment: Simultaneous filing. User may not have seen previous data.  SpO2 100%   BMI 35.15 kg/m  Physical Exam Vitals and nursing note reviewed.   Constitutional:      General: She is not in acute distress.    Appearance: Normal appearance.  HENT:     Head: Normocephalic and atraumatic.     Nose: Nose normal.  Eyes:     Extraocular Movements: Extraocular movements intact.  Cardiovascular:     Rate and Rhythm: Normal rate.     Pulses: Normal pulses.  Pulmonary:     Effort: Pulmonary effort is normal.  Musculoskeletal:        General: Normal range of motion.     Cervical back: Normal range of motion.     Comments: Tenderness to palpation of left knee anteriorly and medially, no palpable knee joint effusion, no knee joint laxity  Skin:    General: Skin is warm and dry.  Neurological:     General: No focal deficit present.     Mental Status: She is alert and oriented to person, place, and time.  Psychiatric:        Mood and Affect: Mood normal.        Behavior: Behavior normal.     ED Results / Procedures / Treatments   Labs (all labs ordered are listed, but only abnormal results are displayed) Labs Reviewed - No data to display  EKG None  Radiology DG Knee Complete 4 Views Left  Result Date: 05/30/2023 CLINICAL DATA:  Left knee pain for few days. EXAM: LEFT KNEE - COMPLETE 4+ VIEW COMPARISON:  None Available. FINDINGS: Mild medial tibiofemoral joint space narrowing. Mild tricompartmental peripheral spurring. Minimal knee joint effusion. Small quadriceps and patellar tendon enthesophytes. No fracture, erosion, or focal bone abnormality. Unremarkable soft tissues IMPRESSION: Mild tricompartmental osteoarthritis. Minimal knee joint effusion. Electronically Signed   By: Narda Rutherford M.D.   On: 05/30/2023 18:57    Procedures Procedures    Medications Ordered in ED Medications - No data to display  ED Course/ Medical Decision Making/ A&P                                 Medical Decision Making This patient presents to the ED with chief complaint(s) of knee pain with pertinent past medical history of diabetes,  multiple myeloma in remission which further complicates the presenting complaint. The complaint involves an extensive differential diagnosis and also carries with it a high risk of complications and morbidity.    The differential diagnosis includes fracture, dislocation, sprain, arthritis, no knee joint effusion erythema or warmth making septic joint unlikely  Additional history obtained: Additional history obtained from N/A Records reviewed N/A  ED Course and Reassessment: On patient's arrival she is hemodynamically stable in no acute distress.  Was initially evaluated in triage and had x-ray of  her knee performed.  X-ray showed mild tricompartmental osteoarthritis but no acute bony abnormality with small knee joint effusion.  Patient will be given Ace wrap for support, declined any pain control.  Recommended outpatient follow-up and was given strict return precautions.  Independent labs interpretation:  N/A  Independent visualization of imaging: - I independently visualized the following imaging with scope of interpretation limited to determining acute life threatening conditions related to emergency care: L knee XR, which revealed tricompartmental osteoarthritis with small knee joint effusion  Consultation: - Consulted or discussed management/test interpretation w/ external professional: N/A  Consideration for admission or further workup: Patient has no emergent conditions requiring admission or further work-up at this time and is stable for discharge home with primary care and orthopedics follow-up  Social Determinants of health: N/A    Amount and/or Complexity of Data Reviewed Radiology: ordered.          Final Clinical Impression(s) / ED Diagnoses Final diagnoses:  Acute pain of left knee    Rx / DC Orders ED Discharge Orders     None         Rexford Maus, DO 05/30/23 1937

## 2023-05-30 NOTE — Discharge Instructions (Signed)
You were seen in the emergency department for your knee pain.  Your x-ray shows that you have arthritis in your knee and a small amount of fluid.  Your pain could be related to your arthritis or you may have sprained one of the ligaments in your knee.  I have given you an Ace wrap to help with the swelling and for support and you can ice your knee and keep it elevated.  You can take Tylenol up to every 6 hours as needed for pain.  You can follow-up with your primary doctor or orthopedics to have your symptoms rechecked.  You should return to the emergency department if your knee turns red or hot, you have fevers, you are unable to walk or if you have any other new or concerning symptoms.

## 2023-05-30 NOTE — ED Triage Notes (Signed)
Patient arrives by POV c/o left knee pain over past few days. Unsure of any injury. States when standing today and putting weight on leg, knee became very painful. Patient ambulates with cane.

## 2023-06-06 DIAGNOSIS — M1712 Unilateral primary osteoarthritis, left knee: Secondary | ICD-10-CM | POA: Diagnosis not present

## 2023-06-12 ENCOUNTER — Ambulatory Visit: Payer: Self-pay | Admitting: Licensed Clinical Social Worker

## 2023-06-12 ENCOUNTER — Telehealth: Payer: Self-pay

## 2023-06-12 NOTE — Patient Outreach (Signed)
  Care Coordination   Follow Up Visit Note   06/12/2023 Name: YANCY ZUBIETA MRN: [id number] DOB: 07-11-1946  Real Cons Lyter is a 77 y.o. year old female who sees [name], Helane Rima, MD for primary care. I spoke with  Real Cons Tuite by phone today.  What matters to the patients health and wellness today?  Symptom Management    Goals Addressed             This Visit's Progress    LCSW-Plan of Care   On track    Activities and task to complete in order to accomplish goals.   Keep all upcoming appointments discussed today Continue with compliance of taking medication prescribed by Doctor Implement healthy coping skills discussed to assist with management of symptoms F/up with insurance company regarding questions regarding providers          SDOH assessments and interventions completed:  No     Care Coordination Interventions:  Yes, provided  Interventions Today    Flowsheet Row Most Recent Value  Chronic Disease   Chronic disease during today's visit Hypertension (HTN), Diabetes, Other  [Hx of Cancer, Depression, GAD]  General Interventions   General Interventions Discussed/Reviewed General Interventions Reviewed, Doctor Visits  Doctor Visits Discussed/Reviewed Doctor Visits Reviewed  Mental Health Interventions   Mental Health Discussed/Reviewed Mental Health Reviewed, Coping Strategies  Safety Interventions   Safety Discussed/Reviewed Safety Reviewed       Follow up plan: Follow up call scheduled for 4-6 weeks    Encounter Outcome:  Patient Visit Completed   Jenel Lucks, MSW, LCSW [facility] Care Management [facility]  [facility] [email or web address] Phone [phone] 1:44 PM

## 2023-06-12 NOTE — Patient Instructions (Signed)
Visit Information  Thank you for taking time to visit with me today. Please don't hesitate to contact me if I can be of assistance to you.   Following are the goals we discussed today:   Goals Addressed             This Visit's Progress    LCSW-Plan of Care   On track    Activities and task to complete in order to accomplish goals.   Keep all upcoming appointments discussed today Continue with compliance of taking medication prescribed by Doctor Implement healthy coping skills discussed to assist with management of symptoms F/up with insurance company regarding questions regarding providers          Our next appointment is by telephone on 12/19 at 1 PM  Please call the care guide team at [phone] if you need to cancel or reschedule your appointment.   If you are experiencing a Mental Health or Behavioral Health Crisis or need someone to talk to, please call the Suicide and Crisis Lifeline: 988 call 911   Patient verbalizes understanding of instructions and care plan provided today and agrees to view in MyChart. Active MyChart status and patient understanding of how to access instructions and care plan via MyChart confirmed with patient.     Jenel Lucks, MSW, LCSW [facility] Care Management [place]  [facility] [email or web address] Phone [phone] 1:45 PM   e

## 2023-06-12 NOTE — Telephone Encounter (Signed)
Transition Care Management Unsuccessful Follow-up Telephone Call  Date of discharge and from where:  [name] 1/1  Attempts:  1st Attempt  Reason for unsuccessful TCM follow-up call:  No answer/busy   [facility]acy [name]  [facility], [facility] Guide, Phone: [phone] Website: [name]

## 2023-06-13 ENCOUNTER — Telehealth: Payer: Self-pay

## 2023-06-13 NOTE — Telephone Encounter (Signed)
Transition Care Management Unsuccessful Follow-up Telephone Call  Date of discharge and from where:  Tracy [name] 11/1  Attempts:  2nd Attempt  Reason for unsuccessful TCM follow-up call:  No answer/busy   Lenard Forth [place]  [facility], [facility] Guide, Phone: [phone] Website: [email or web address]

## 2023-06-16 ENCOUNTER — Telehealth: Payer: Self-pay | Admitting: Family Medicine

## 2023-06-16 DIAGNOSIS — Z5112 Encounter for antineoplastic immunotherapy: Secondary | ICD-10-CM

## 2023-06-16 DIAGNOSIS — C9000 Multiple myeloma not having achieved remission: Secondary | ICD-10-CM

## 2023-06-16 MED ORDER — LORATADINE 10 MG PO TABS: 10.0000 mg | ORAL_TABLET | Freq: Every day | ORAL | 1 refills | Status: DC

## 2023-06-16 NOTE — Telephone Encounter (Signed)
Loratadine is the generic for Claritin and is available at any pharmacy or grocery store.  You can get the generic form at [facility]- 200 pills is $19.99, 300 pills is $27.49 (so nearly a year for under $30)

## 2023-06-16 NOTE — Telephone Encounter (Signed)
Prescription sent to mail order pharmacy but I'm not sure insurance will cover this since it's available OTC

## 2023-06-16 NOTE — Telephone Encounter (Signed)
Pt is asking if she can get a prescription . She does not want to pay out of pocket for this. Please advise

## 2023-06-16 NOTE — Telephone Encounter (Signed)
Caller name: ANGELEAN COXWELL  On DPR?: Yes  Call back number: [phone] (mobile)  Provider they see: [name], MD  Reason for call:   Pt would like to know if there is a generic to Claritin because a bit expensive- advise

## 2023-06-16 NOTE — Telephone Encounter (Signed)
Called pt unable to leave vm at this time

## 2023-06-17 NOTE — Telephone Encounter (Signed)
Patient has called back and was informed

## 2023-06-17 NOTE — Telephone Encounter (Signed)
Called and LM to call back so we can discuss this

## 2023-06-19 ENCOUNTER — Encounter: Payer: Self-pay | Admitting: Hematology

## 2023-06-19 NOTE — Telephone Encounter (Signed)
Telephone call  

## 2023-06-20 ENCOUNTER — Other Ambulatory Visit: Payer: Medicare Other

## 2023-06-20 ENCOUNTER — Inpatient Hospital Stay: Payer: Medicare Other | Admitting: Hematology

## 2023-06-20 ENCOUNTER — Inpatient Hospital Stay: Payer: Medicare Other | Attending: Hematology

## 2023-06-20 ENCOUNTER — Inpatient Hospital Stay: Payer: Medicare Other

## 2023-06-20 ENCOUNTER — Ambulatory Visit: Payer: Medicare Other

## 2023-06-20 VITALS — BP 150/66 | HR 81 | Temp 98.1°F | Resp 16

## 2023-06-20 DIAGNOSIS — G629 Polyneuropathy, unspecified: Secondary | ICD-10-CM | POA: Diagnosis not present

## 2023-06-20 DIAGNOSIS — C9000 Multiple myeloma not having achieved remission: Secondary | ICD-10-CM | POA: Diagnosis not present

## 2023-06-20 DIAGNOSIS — Z801 Family history of malignant neoplasm of trachea, bronchus and lung: Secondary | ICD-10-CM | POA: Diagnosis not present

## 2023-06-20 DIAGNOSIS — I129 Hypertensive chronic kidney disease with stage 1 through stage 4 chronic kidney disease, or unspecified chronic kidney disease: Secondary | ICD-10-CM | POA: Insufficient documentation

## 2023-06-20 DIAGNOSIS — Z7189 Other specified counseling: Secondary | ICD-10-CM

## 2023-06-20 DIAGNOSIS — C7951 Secondary malignant neoplasm of bone: Secondary | ICD-10-CM

## 2023-06-20 DIAGNOSIS — E1122 Type 2 diabetes mellitus with diabetic chronic kidney disease: Secondary | ICD-10-CM | POA: Diagnosis not present

## 2023-06-20 DIAGNOSIS — Z923 Personal history of irradiation: Secondary | ICD-10-CM | POA: Insufficient documentation

## 2023-06-20 DIAGNOSIS — E1140 Type 2 diabetes mellitus with diabetic neuropathy, unspecified: Secondary | ICD-10-CM | POA: Diagnosis not present

## 2023-06-20 DIAGNOSIS — Z87891 Personal history of nicotine dependence: Secondary | ICD-10-CM | POA: Diagnosis not present

## 2023-06-20 DIAGNOSIS — G2581 Restless legs syndrome: Secondary | ICD-10-CM | POA: Diagnosis not present

## 2023-06-20 DIAGNOSIS — Z8673 Personal history of transient ischemic attack (TIA), and cerebral infarction without residual deficits: Secondary | ICD-10-CM | POA: Insufficient documentation

## 2023-06-20 DIAGNOSIS — R110 Nausea: Secondary | ICD-10-CM | POA: Insufficient documentation

## 2023-06-20 DIAGNOSIS — G4733 Obstructive sleep apnea (adult) (pediatric): Secondary | ICD-10-CM | POA: Insufficient documentation

## 2023-06-20 DIAGNOSIS — Z853 Personal history of malignant neoplasm of breast: Secondary | ICD-10-CM | POA: Insufficient documentation

## 2023-06-20 DIAGNOSIS — I2510 Atherosclerotic heart disease of native coronary artery without angina pectoris: Secondary | ICD-10-CM | POA: Diagnosis not present

## 2023-06-20 DIAGNOSIS — Z5112 Encounter for antineoplastic immunotherapy: Secondary | ICD-10-CM

## 2023-06-20 DIAGNOSIS — Z79899 Other long term (current) drug therapy: Secondary | ICD-10-CM | POA: Diagnosis not present

## 2023-06-20 DIAGNOSIS — E538 Deficiency of other specified B group vitamins: Secondary | ICD-10-CM | POA: Diagnosis not present

## 2023-06-20 DIAGNOSIS — Z803 Family history of malignant neoplasm of breast: Secondary | ICD-10-CM | POA: Diagnosis not present

## 2023-06-20 DIAGNOSIS — I252 Old myocardial infarction: Secondary | ICD-10-CM | POA: Diagnosis not present

## 2023-06-20 DIAGNOSIS — Z90710 Acquired absence of both cervix and uterus: Secondary | ICD-10-CM | POA: Insufficient documentation

## 2023-06-20 DIAGNOSIS — Z95828 Presence of other vascular implants and grafts: Secondary | ICD-10-CM

## 2023-06-20 DIAGNOSIS — D509 Iron deficiency anemia, unspecified: Secondary | ICD-10-CM | POA: Diagnosis not present

## 2023-06-20 DIAGNOSIS — N189 Chronic kidney disease, unspecified: Secondary | ICD-10-CM | POA: Diagnosis not present

## 2023-06-20 DIAGNOSIS — Z800 Family history of malignant neoplasm of digestive organs: Secondary | ICD-10-CM | POA: Diagnosis not present

## 2023-06-20 LAB — CBC WITH DIFFERENTIAL (CANCER CENTER ONLY)
Abs Immature Granulocytes: 0.02 10*3/uL (ref 0.00–0.07)
Basophils Absolute: 0 10*3/uL (ref 0.0–0.1)
Basophils Relative: 1 %
Eosinophils Absolute: 0.1 10*3/uL (ref 0.0–0.5)
Eosinophils Relative: 2 %
HCT: 31.2 % — ABNORMAL LOW (ref 36.0–46.0)
Hemoglobin: 10.3 g/dL — ABNORMAL LOW (ref 12.0–15.0)
Immature Granulocytes: 0 %
Lymphocytes Relative: 38 %
Lymphs Abs: 2.2 10*3/uL (ref 0.7–4.0)
MCH: 31.8 pg (ref 26.0–34.0)
MCHC: 33 g/dL (ref 30.0–36.0)
MCV: 96.3 fL (ref 80.0–100.0)
Monocytes Absolute: 0.4 10*3/uL (ref 0.1–1.0)
Monocytes Relative: 6 %
Neutro Abs: 3.2 10*3/uL (ref 1.7–7.7)
Neutrophils Relative %: 53 %
Platelet Count: 271 10*3/uL (ref 150–400)
RBC: 3.24 MIL/uL — ABNORMAL LOW (ref 3.87–5.11)
RDW: 13.3 % (ref 11.5–15.5)
WBC Count: 5.9 10*3/uL (ref 4.0–10.5)
nRBC: 0 % (ref 0.0–0.2)

## 2023-06-20 LAB — CMP (CANCER CENTER ONLY)
ALT: 8 U/L (ref 0–44)
AST: 11 U/L — ABNORMAL LOW (ref 15–41)
Albumin: 4.1 g/dL (ref 3.5–5.0)
Alkaline Phosphatase: 61 U/L (ref 38–126)
Anion gap: 9 (ref 5–15)
BUN: 15 mg/dL (ref 8–23)
CO2: 24 mmol/L (ref 22–32)
Calcium: 9.2 mg/dL (ref 8.9–10.3)
Chloride: 107 mmol/L (ref 98–111)
Creatinine: 1.23 mg/dL — ABNORMAL HIGH (ref 0.44–1.00)
GFR, Estimated: 45 mL/min — ABNORMAL LOW (ref >60)
Glucose, Bld: 126 mg/dL — ABNORMAL HIGH (ref 70–99)
Potassium: 4.3 mmol/L (ref 3.5–5.1)
Sodium: 140 mmol/L (ref 135–145)
Total Bilirubin: 0.3 mg/dL (ref <1.2)
Total Protein: 6.7 g/dL (ref 6.5–8.1)

## 2023-06-20 MED ORDER — SODIUM CHLORIDE 0.9 % IV SOLN: Freq: Once | INTRAVENOUS | Status: AC

## 2023-06-20 MED ORDER — SODIUM CHLORIDE 0.9% FLUSH
10.0000 mL | Freq: Once | INTRAVENOUS | Status: AC
  Administered 2023-06-20: 10 mL

## 2023-06-20 MED ORDER — HEPARIN SOD (PORK) LOCK FLUSH 100 UNIT/ML IV SOLN
500.0000 [IU] | Freq: Once | INTRAVENOUS | Status: AC | PRN
  Administered 2023-06-20: 500 [IU]

## 2023-06-20 MED ORDER — CYANOCOBALAMIN 1000 MCG/ML IJ SOLN
1000.0000 ug | Freq: Once | INTRAMUSCULAR | Status: AC
  Administered 2023-06-20: 1000 ug via INTRAMUSCULAR
  Filled 2023-06-20: qty 1

## 2023-06-20 MED ORDER — FAMOTIDINE IN NACL 20-0.9 MG/50ML-% IV SOLN
20.0000 mg | Freq: Once | INTRAVENOUS | Status: AC
Start: 2023-06-20 — End: 2023-06-20
  Administered 2023-06-20: 20 mg via INTRAVENOUS
  Filled 2023-06-20: qty 50

## 2023-06-20 MED ORDER — ACETAMINOPHEN 325 MG PO TABS
650.0000 mg | ORAL_TABLET | Freq: Once | ORAL | Status: AC
  Administered 2023-06-20: 650 mg via ORAL
  Filled 2023-06-20: qty 2

## 2023-06-20 MED ORDER — SODIUM CHLORIDE 0.9 % IV SOLN
16.0000 mg/kg | Freq: Once | INTRAVENOUS | Status: AC
  Administered 2023-06-20: 1300 mg via INTRAVENOUS
  Filled 2023-06-20: qty 5

## 2023-06-20 MED ORDER — SODIUM CHLORIDE 0.9% FLUSH
10.0000 mL | INTRAVENOUS | Status: DC | PRN
  Administered 2023-06-20: 10 mL

## 2023-06-20 MED ORDER — SODIUM CHLORIDE 0.9 % IV SOLN
16.0000 mg | Freq: Once | INTRAVENOUS | Status: AC
  Administered 2023-06-20: 16 mg via INTRAVENOUS
  Filled 2023-06-20: qty 1.6

## 2023-06-20 MED ORDER — DIPHENHYDRAMINE HCL 25 MG PO CAPS
50.0000 mg | ORAL_CAPSULE | Freq: Once | ORAL | Status: AC
  Administered 2023-06-20: 50 mg via ORAL
  Filled 2023-06-20: qty 2

## 2023-06-20 NOTE — Patient Instructions (Signed)
[facility] - A DEPT OF [facility]  Discharge Instructions: Thank you for choosing [facility] to provide your oncology and hematology care.   If you have a lab appointment with the Cancer Center, please go directly to the Cancer Center and check in at the registration area.   Wear comfortable clothing and clothing appropriate for easy access to any Portacath or PICC line.   We strive to give you quality time with your provider. You may need to reschedule your appointment if you arrive late (15 or more minutes).  Arriving late affects you and other patients whose appointments are after yours.  Also, if you miss three or more appointments without notifying the office, you may be dismissed from the clinic at the provider's discretion.      For prescription refill requests, have your pharmacy contact our office and allow 72 hours for refills to be completed.    Today you received the following chemotherapy and/or immunotherapy agents: daratumumab      To help prevent nausea and vomiting after your treatment, we encourage you to take your nausea medication as directed.  BELOW ARE SYMPTOMS THAT SHOULD BE REPORTED IMMEDIATELY: *FEVER GREATER THAN 100.4 F (38 C) OR HIGHER *CHILLS OR SWEATING *NAUSEA AND VOMITING THAT IS NOT CONTROLLED WITH YOUR NAUSEA MEDICATION *UNUSUAL SHORTNESS OF BREATH *UNUSUAL BRUISING OR BLEEDING *URINARY PROBLEMS (pain or burning when urinating, or frequent urination) *BOWEL PROBLEMS (unusual diarrhea, constipation, pain near the anus) TENDERNESS IN MOUTH AND THROAT WITH OR WITHOUT PRESENCE OF ULCERS (sore throat, sores in mouth, or a toothache) UNUSUAL RASH, SWELLING OR PAIN  UNUSUAL VAGINAL DISCHARGE OR ITCHING   Items with * indicate a potential emergency and should be followed up as soon as possible or go to the Emergency Department if any problems should occur.  Please show the CHEMOTHERAPY ALERT CARD or  IMMUNOTHERAPY ALERT CARD at check-in to the Emergency Department and triage nurse.  Should you have questions after your visit or need to cancel or reschedule your appointment, please contact [facility] - A DEPT OF [facility]  Dept: [phone]  and follow the prompts.  Office hours are 8:00 a.m. to 4:30 p.m. Monday - Friday. Please note that voicemails left after 4:00 p.m. may not be returned until the following business day.  We are closed weekends and major holidays. You have access to a nurse at all times for urgent questions. Please call the main number to the clinic Dept: [phone] and follow the prompts.   For any non-urgent questions, you may also contact your provider using MyChart. We now offer e-Visits for anyone 61 and older to request care online for non-urgent symptoms. For details visit [email or web address].   Also download the MyChart app! Go to the app store, search "MyChart", open the app, select [facility], and log in with your MyChart username and password.

## 2023-06-20 NOTE — Progress Notes (Signed)
[facility] Clinic Follow up:   Date of Service: 06/20/23   Tracy Hatch, MD [address] [place] [address]  CC: Follow-up for continued evaluation and management of multiple myeloma  SUMMARY OF ONCOLOGIC HISTORY: Oncology History  History of right breast cancer  04/16/2000 Surgery   Left breast: Triple negative  invasive ductal cancer treated with lumpectomy, adjuvant chemotherapy, radiation , in [place], unknown stage   06/07/2015 Mammogram   Right breast mass 6x 6 x 5 mm, right axillary lymph node with slight cortex thickening measured 5 mm    06/13/2015 Initial Diagnosis   Right breast needle biopsy: Invasive ductal carcinoma, grade 1, right axillary lymph node biopsy negative , ER 95%, PR 5%, Ki-67 10%, HER-2 negative   06/13/2015 Clinical Stage   Stage IA: T1b N0   07/07/2015 Surgery   Right lumpectomy: Invasive ductal carcinoma grade 1, 1 cm span, with low-grade DCIS, DCIS focally 0.1 cm to inferior margin, 0/3 lymph nodes negative, ER 95%, PR 5%, HER-2 negative ratio 1.1, Ki-67 10%   07/07/2015 Pathologic Stage   Stage IA: T1c N0   07/13/2015 Procedure   Breast High/Moderate Risk Panel reveals no clinically significant variant at ATM, BRCA1, BRCA2, CDH1, CHEK2, PALB2, PTEN, and TP53.     08/23/2015 - 09/21/2015 Radiation Therapy   Adjuvant Radiation: Right breast/ 42.5Gy at 2.5 Gy per fraction x 17 fractions.   Right breast boost/ 7.5 Gy at 2.5 Gy per fraction x 3 fractions    Anti-estrogen oral therapy   Patient refused antiestrogen therapy   10/20/2015 Survivorship   Survivorship care plan completed and mailed to patient in lieu of in person visit at her request   Iron deficiency anemia  Multiple myeloma (HCC)  01/25/2020 Initial Diagnosis   Multiple myeloma (HCC)   02/11/2020 - 05/19/2020 Adjuvant Chemotherapy   Daratumumab Weekly    02/29/2020 - 03/13/2020 Radiation Therapy   The targets were treated to a total dose of 20 Gy in 10  fractions of 2 Gy each to the left hip and L5 using one plan.   06/02/2020 - 10/05/2020 Adjuvant Chemotherapy   Daratumumab and Carfilzomib   10/19/2020 -  Adjuvant Chemotherapy   Maintenance Daratumumab--initially every 2 weeks, then every 4 weeks.    05/24/2022 -  Chemotherapy   Patient is on Treatment Plan : MYELOMA Daratumumab IV q28d     Malignant neoplasm metastatic to bone (HCC)  02/08/2021 Initial Diagnosis   Bone metastases (HCC)   05/24/2022 -  Chemotherapy   Patient is on Treatment Plan : MYELOMA Daratumumab IV q28d       CURRENT THERAPY: Daratumumab/B12 injection   INTERVAL HISTORY:  Tracy [name] is a 77 y.o. female here for continued evaluation and management of multiple myeloma. She was last seen by me on 04/25/2023 and complained of neuropathy, sensation of swollen legs, burning sensation/pain in left chest, postprandial nausea  She presented to the ED on 05/30/2023 for acute pain of left knee.  Today, she reports that she endorses arthritis in her knees. Patient reports that after dropping an object, she was unable to use her left knee to stand upright after bending over due to pain. She reports hearing a pop and presented to the ED. Patient did have an X-ray but did not have MRI. She was also seen by orthopedics. Patient reports that she was told that she may need a knee replacement. She is stilll able to walk at this time  and her knee pain and swelling is settling down. Patient notes that she was given a knee band and has a knee brace available at home. Patient reports an upcoming orthopedics appointment on December 9th.   She complains of nausea, but denies any infection issues, leg swelling, or change in bowel habits.   Patient reports jaw issues. She previously endorsed tooth pain, which has resolved. Patient more recently experienced left upper cheekbone pain and is considering scheduling a dentist appointment for evaluation.   Patient reports that she is planning  to receive her Shingles vaccine in December.   Patient Active Problem List   Diagnosis Date Noted   RLS (restless legs syndrome) 06/12/2022   Malignant neoplasm metastatic to bone (HCC) 02/08/2021   Angiodysplasia of intestine 08/23/2020   Port-A-Cath in place 08/04/2020   Counseling regarding advance care planning and goals of care 02/07/2020   Multiple myeloma (HCC) 01/25/2020   Dizziness 10/12/2019   Post-nasal drainage 10/12/2019   Allergic rhinitis 08/19/2018   Type 2 diabetes mellitus with diabetic neuropathy, unspecified (HCC) 11/05/2017   Encounter for long-term use of muscle relaxants 09/24/2017   CAD in native artery 09/24/2017   OSA (obstructive sleep apnea) 09/24/2017   Snorings 09/24/2017   Asthenia 06/30/2017   Morbid obesity (HCC) 06/02/2017   Depression 06/02/2017   Iron deficiency anemia 09/23/2016   Anemia of chronic disease 09/28/2015   Genetic testing 08/21/2015   History of left breast cancer 07/13/2015   History of right breast cancer 06/20/2015   Hearing loss due to cerumen impaction 12/29/2014   Allergy to adhesive tape 05/19/2014   Hyperlipidemia 12/06/2013   GERD (gastroesophageal reflux disease) 12/06/2013   Cervical disc disease 11/11/2013   Osteopenia 05/25/2013   Allergic asthma 12/24/2012   Insomnia 04/02/2012   Physical exam, annual 04/02/2012   HTN (hypertension) 02/03/2012   Vertigo, benign positional 02/03/2012   Left groin pain 02/03/2012   Hip pain 10/10/2011   TIA (transient ischemic attack) 07/24/2011   Allergic reaction 07/05/2011   Osteoarthrosis involving lower leg 10/19/2010   Disorder of bone and cartilage 05/01/2010   Generalized anxiety disorder 05/01/2010   Vitamin D deficiency 02/20/2010   Unspecified chronic bronchitis (HCC) 08/24/2009   Other lymphedema 10/29/2007    is allergic to bacitracin-neomycin-polymyxin  [neomycin-bacitracin zn-polymyx], nsaids, tape, ambien [zolpidem tartrate], amoxicillin, clavulanic acid,  contrast media [iodinated contrast media], latex, prednisone, and tessalon [benzonatate].  MEDICAL HISTORY: Past Medical History:  Diagnosis Date   Angiodysplasia of intestine 08/23/2020   Anxiety    Arthritis    Breast cancer (HCC) 06/13/15   Cancer (HCC) 2000   breast cancer   Chronic bronchitis (HCC)    Chronic bronchitis (HCC)    Hyperlipidemia    Hypertension    Myocardial infarction [facility]) 2001   Personal history of radiation therapy    Restless leg    Stroke (HCC) 2004   TIA, no deficits    SURGICAL HISTORY: Past Surgical History:  Procedure Laterality Date   ABDOMINAL HYSTERECTOMY  1985   BREAST LUMPECTOMY Left 2000   radiation and chemo   BREAST LUMPECTOMY Right 2016   radiation   BREAST SURGERY  2001   lt breast lumpectomy   COLONOSCOPY WITH ESOPHAGOGASTRODUODENOSCOPY (EGD)  04/2020   ENTEROSCOPY N/A 03/15/2021   Procedure: ENTEROSCOPY;  Surgeon: Rachael Fee, MD;  Location: [facility];  Service: Endoscopy;  Laterality: N/A;   GIVENS CAPSULE STUDY  07/2020   HOT HEMOSTASIS N/A 03/15/2021   Procedure: HOT HEMOSTASIS (  ARGON PLASMA COAGULATION/BICAP);  Surgeon: Rachael Fee, MD;  Location: [facility];  Service: Endoscopy;  Laterality: N/A;   IR IMAGING GUIDED PORT INSERTION  04/25/2020   RADIOACTIVE SEED GUIDED PARTIAL MASTECTOMY WITH AXILLARY SENTINEL LYMPH NODE BIOPSY Right 07/07/2015   Procedure: RIGHT RADIOACTIVE SEED GUIDED PARTIAL MASTECTOMY WITH AXILLARY SENTINEL LYMPH NODE BIOPSY;  Surgeon: Chevis Pretty III, MD;  Location: [facility];  Service: General;  Laterality: Right;   SMALL INTESTINE SURGERY     TUBAL LIGATION     UPPER GASTROINTESTINAL ENDOSCOPY      SOCIAL HISTORY: Social History   Socioeconomic History   Marital status: Divorced    Spouse name: Not on file   Number of children: 7   Years of education: Not on file   Highest education level: Not on file  Occupational History   Occupation: retired  Tobacco Use    Smoking status: Former    Current packs/day: 0.00    Average packs/day: 1 pack/day for 20.0 years (20.0 ttl pk-yrs)    Types: Cigarettes    Start date: 07/30/1991    Quit date: 07/30/2011    Years since quitting: 11.8   Smokeless tobacco: Never   Tobacco comments:    Quit >4 years ago; 1 ppd for about 5/20 years (remaining was less)  Vaping Use   Vaping status: Former  Substance and Sexual Activity   Alcohol use: No    Alcohol/week: 0.0 standard drinks of alcohol   Drug use: No   Sexual activity: Not Currently  Other Topics Concern   Not on file  Social History Narrative   Lives alone.  Retired.  Education:  11th grade GED.  Children:  7 (one here).    Social Determinants of Health   Financial Resource Strain: High Risk (12/26/2022)   Overall Financial Resource Strain (CARDIA)    Difficulty of Paying Living Expenses: Hard  Food Insecurity: No Food Insecurity (03/19/2023)   Hunger Vital Sign    Worried About Running Out of Food in the Last Year: Never true    Ran Out of Food in the Last Year: Never true  Transportation Needs: Unmet Transportation Needs (04/10/2023)   PRAPARE - Administrator, Civil Service (Medical): Yes    Lack of Transportation (Non-Medical): No  Physical Activity: Inactive (12/26/2022)   Exercise Vital Sign    Days of Exercise per Week: 0 days    Minutes of Exercise per Session: 0 min  Stress: Stress Concern Present (12/26/2022)   [employer] of Occupational Health - Occupational Stress Questionnaire    Feeling of Stress : To some extent  Social Connections: Moderately Isolated (12/26/2022)   Social Connection and Isolation Panel [NHANES]    Frequency of Communication with Friends and Family: More than three times a week    Frequency of Social Gatherings with Friends and Family: Once a week    Attends Religious Services: More than 4 times per year    Active Member of [employer] or Organizations: No    Attends Banker Meetings: Never     Marital Status: Divorced  Catering manager Violence: Not At Risk (12/26/2022)   Humiliation, Afraid, Rape, and Kick questionnaire    Fear of Current or Ex-Partner: No    Emotionally Abused: No    Physically Abused: No    Sexually Abused: No    FAMILY HISTORY: Family History  Problem Relation Age of Onset   Emphysema Mother 63  smoker   Diabetes Father    Lung cancer Sister        dx. <50; former smoker   Diabetes Brother    Diabetes Brother    Brain cancer Brother 78       unknown tumor type   Diabetes Paternal Aunt    Stroke Maternal Grandmother    Diabetes Paternal Grandmother    Cancer Daughter 45       neck cancer   Other Daughter        hysterectomy for unspecified reason   Colon cancer Daughter    Breast cancer Cousin    Cancer Cousin        unspecified type   Breast cancer Other        triple negative breast cancer in her 55s   Colon polyps Neg Hx    Esophageal cancer Neg Hx    Gallbladder disease Neg Hx    ROS  10 Point review of Systems was done is negative except as noted above.   PHYSICAL EXAMINATION  ECOG PERFORMANCE STATUS: 1 - Symptomatic but completely ambulatory vss GENERAL:alert, in no acute distress and comfortable SKIN: no acute rashes, no significant lesions EYES: conjunctiva are pink and non-injected, sclera anicteric OROPHARYNX: MMM, no exudates, no oropharyngeal erythema or ulceration NECK: supple, no JVD LYMPH:  no palpable lymphadenopathy in the cervical, axillary or inguinal regions LUNGS: clear to auscultation b/l with normal respiratory effort HEART: regular rate & rhythm ABDOMEN:  normoactive bowel sounds , non tender, not distended. Extremity: no pedal edema PSYCH: alert & oriented x 3 with fluent speech NEURO: no focal motor/sensory deficits    LABORATORY DATA: .    Latest Ref Rng & Units 06/20/2023   11:23 AM 05/23/2023   12:15 PM 04/25/2023   10:37 AM  CBC  WBC 4.0 - 10.5 K/uL 5.9  5.0  4.8   Hemoglobin 12.0 -  15.0 g/dL 16.1  09.6  04.5   Hematocrit 36.0 - 46.0 % 31.2  31.3  33.9   Platelets 150 - 400 K/uL 271  305  276    .    Latest Ref Rng & Units 06/20/2023   11:23 AM 05/23/2023   12:15 PM 04/25/2023   10:37 AM  CMP  Glucose 70 - 99 mg/dL 409  811  914   BUN 8 - 23 mg/dL 15  14  12    Creatinine 0.44 - 1.00 mg/dL 7.82  9.56  2.13   Sodium 135 - 145 mmol/L 140  143  142   Potassium 3.5 - 5.1 mmol/L 4.3  4.2  4.3   Chloride 98 - 111 mmol/L 107  109  107   CO2 22 - 32 mmol/L 24  24  27    Calcium 8.9 - 10.3 mg/dL 9.2  9.2  9.5   Total Protein 6.5 - 8.1 g/dL 6.7  6.7  7.0   Total Bilirubin <1.2 mg/dL 0.3  0.2  0.3   Alkaline Phos 38 - 126 U/L 61  71  72   AST 15 - 41 U/L 11  13  14    ALT 0 - 44 U/L 8  10  11      Mammogram 09/23/2022:   ASSESSMENT and THERAPY PLAN:   77 yo female here for follow-up of her multiple myeloma   1)  IgG Kappa Multiple myeloma with bone lesions, anemia, renal insuff. M spike @ 3.7g/dl on diagnosis. 1p deletion, polymorphic variant, 13q deletion Multiple myeloma panel from 06/28/2021 with no  M spike.  IFE positive for IgG kappa monoclonal protein possibly from her daratumumab. Currently on monthly daratumumab 2) h/o DM2 3) Diabetic Neuropathy 4) CKD - likely from DM2, but could have an element of myeloma kidney. 5) h/o TIA and AMI 6) Iron deficiency 7) B12 deficiency   PLAN:  -Discussed lab results on 06/20/23 in detail with patient. CBC showed WBC of 5.9K, hemoglobin stable at 10.3, and platelets of 271K. -last myeloma panel from 4 weeks ago showed M protein level of 0.2 g/dL. This is likely a reflection of IgG kappa antibody from daratumumab treatment.  -Continue monthly Daratumumab treatment for continued maintenance -continue monthly B12  -Continue taking weekly vitamin D supplements to strengthen immune system and improve bone health -discussed that a knee replacement is seldom an emergent procedure, and would not be recommended unless  absolutely necessary. Discussed that this surgery would delay myeloma treatment and increase the risk of infection.  -recommend patient to stat UTD with age-appropriate vaccines -will refill emla cream  Follow-up: Per integrated scheduling MD visit in 2 months  The total time spent in the appointment was 30 minutes* .  All of the patient's questions were answered with apparent satisfaction. The patient knows to call the clinic with any problems, questions or concerns.   Wyvonnia Lora MD MS AAHIVMS [facility] Hematology/Oncology Physician [facility]  .*Total Encounter Time as defined by the Centers for Medicare and Medicaid Services includes, in addition to the face-to-face time of a patient visit (documented in the note above) non-face-to-face time: obtaining and reviewing outside history, ordering and reviewing medications, tests or procedures, care coordination (communications with other health care professionals or caregivers) and documentation in the medical record.    I,Mitra Faeizi,acting as a Neurosurgeon for Wyvonnia Lora, MD.,have documented all relevant documentation on the behalf of Wyvonnia Lora, MD,as directed by  Wyvonnia Lora, MD while in the presence of Wyvonnia Lora, MD.  .I have reviewed the above documentation for accuracy and completeness, and I agree with the above. Johney Maine MD

## 2023-06-26 ENCOUNTER — Encounter: Payer: Self-pay | Admitting: Hematology

## 2023-06-29 LAB — MULTIPLE MYELOMA PANEL, SERUM
Albumin SerPl Elph-Mcnc: 3.6 g/dL (ref 2.9–4.4)
Albumin/Glob SerPl: 1.4 (ref 0.7–1.7)
Alpha 1: 0.2 g/dL (ref 0.0–0.4)
Alpha2 Glob SerPl Elph-Mcnc: 1.3 g/dL — ABNORMAL HIGH (ref 0.4–1.0)
B-Globulin SerPl Elph-Mcnc: 0.9 g/dL (ref 0.7–1.3)
Gamma Glob SerPl Elph-Mcnc: 0.4 g/dL (ref 0.4–1.8)
Globulin, Total: 2.7 g/dL (ref 2.2–3.9)
IgA: 62 mg/dL — ABNORMAL LOW (ref 64–422)
IgG (Immunoglobin G), Serum: 423 mg/dL — ABNORMAL LOW (ref 586–1602)
IgM (Immunoglobulin M), Srm: 40 mg/dL (ref 26–217)
M Protein SerPl Elph-Mcnc: 0.1 g/dL — ABNORMAL HIGH
Total Protein ELP: 6.3 g/dL (ref 6.0–8.5)

## 2023-07-03 ENCOUNTER — Other Ambulatory Visit: Payer: Self-pay

## 2023-07-03 DIAGNOSIS — C9000 Multiple myeloma not having achieved remission: Secondary | ICD-10-CM

## 2023-07-03 MED ORDER — LIDOCAINE 5 % EX PTCH: 1.0000 | MEDICATED_PATCH | CUTANEOUS | 0 refills | Status: DC

## 2023-07-04 ENCOUNTER — Inpatient Hospital Stay: Payer: Medicare Other

## 2023-07-04 ENCOUNTER — Inpatient Hospital Stay: Payer: Medicare Other | Attending: Hematology

## 2023-07-04 VITALS — BP 156/70 | HR 83 | Temp 98.3°F | Resp 16

## 2023-07-04 DIAGNOSIS — R110 Nausea: Secondary | ICD-10-CM | POA: Diagnosis not present

## 2023-07-04 DIAGNOSIS — Z800 Family history of malignant neoplasm of digestive organs: Secondary | ICD-10-CM | POA: Diagnosis not present

## 2023-07-04 DIAGNOSIS — C9000 Multiple myeloma not having achieved remission: Secondary | ICD-10-CM | POA: Diagnosis not present

## 2023-07-04 DIAGNOSIS — G4733 Obstructive sleep apnea (adult) (pediatric): Secondary | ICD-10-CM | POA: Insufficient documentation

## 2023-07-04 DIAGNOSIS — Z95828 Presence of other vascular implants and grafts: Secondary | ICD-10-CM

## 2023-07-04 DIAGNOSIS — Z79899 Other long term (current) drug therapy: Secondary | ICD-10-CM | POA: Diagnosis not present

## 2023-07-04 DIAGNOSIS — I252 Old myocardial infarction: Secondary | ICD-10-CM | POA: Diagnosis not present

## 2023-07-04 DIAGNOSIS — I2510 Atherosclerotic heart disease of native coronary artery without angina pectoris: Secondary | ICD-10-CM | POA: Insufficient documentation

## 2023-07-04 DIAGNOSIS — C7951 Secondary malignant neoplasm of bone: Secondary | ICD-10-CM

## 2023-07-04 DIAGNOSIS — Z801 Family history of malignant neoplasm of trachea, bronchus and lung: Secondary | ICD-10-CM | POA: Insufficient documentation

## 2023-07-04 DIAGNOSIS — E1140 Type 2 diabetes mellitus with diabetic neuropathy, unspecified: Secondary | ICD-10-CM | POA: Insufficient documentation

## 2023-07-04 DIAGNOSIS — Z8673 Personal history of transient ischemic attack (TIA), and cerebral infarction without residual deficits: Secondary | ICD-10-CM | POA: Diagnosis not present

## 2023-07-04 DIAGNOSIS — Z90710 Acquired absence of both cervix and uterus: Secondary | ICD-10-CM | POA: Insufficient documentation

## 2023-07-04 DIAGNOSIS — Z803 Family history of malignant neoplasm of breast: Secondary | ICD-10-CM | POA: Diagnosis not present

## 2023-07-04 DIAGNOSIS — Z853 Personal history of malignant neoplasm of breast: Secondary | ICD-10-CM | POA: Diagnosis not present

## 2023-07-04 DIAGNOSIS — G629 Polyneuropathy, unspecified: Secondary | ICD-10-CM | POA: Insufficient documentation

## 2023-07-04 DIAGNOSIS — E538 Deficiency of other specified B group vitamins: Secondary | ICD-10-CM | POA: Insufficient documentation

## 2023-07-04 DIAGNOSIS — G2581 Restless legs syndrome: Secondary | ICD-10-CM | POA: Diagnosis not present

## 2023-07-04 DIAGNOSIS — Z923 Personal history of irradiation: Secondary | ICD-10-CM | POA: Diagnosis not present

## 2023-07-04 DIAGNOSIS — I129 Hypertensive chronic kidney disease with stage 1 through stage 4 chronic kidney disease, or unspecified chronic kidney disease: Secondary | ICD-10-CM | POA: Diagnosis not present

## 2023-07-04 DIAGNOSIS — Z87891 Personal history of nicotine dependence: Secondary | ICD-10-CM | POA: Insufficient documentation

## 2023-07-04 DIAGNOSIS — E1122 Type 2 diabetes mellitus with diabetic chronic kidney disease: Secondary | ICD-10-CM | POA: Insufficient documentation

## 2023-07-04 DIAGNOSIS — D509 Iron deficiency anemia, unspecified: Secondary | ICD-10-CM | POA: Insufficient documentation

## 2023-07-04 DIAGNOSIS — N189 Chronic kidney disease, unspecified: Secondary | ICD-10-CM | POA: Insufficient documentation

## 2023-07-04 LAB — CMP (CANCER CENTER ONLY)
ALT: 9 U/L (ref 0–44)
AST: 13 U/L — ABNORMAL LOW (ref 15–41)
Albumin: 4 g/dL (ref 3.5–5.0)
Alkaline Phosphatase: 59 U/L (ref 38–126)
Anion gap: 10 (ref 5–15)
BUN: 15 mg/dL (ref 8–23)
CO2: 24 mmol/L (ref 22–32)
Calcium: 8.4 mg/dL — ABNORMAL LOW (ref 8.9–10.3)
Chloride: 110 mmol/L (ref 98–111)
Creatinine: 1.27 mg/dL — ABNORMAL HIGH (ref 0.44–1.00)
GFR, Estimated: 43 mL/min — ABNORMAL LOW (ref >60)
Glucose, Bld: 117 mg/dL — ABNORMAL HIGH (ref 70–99)
Potassium: 3.7 mmol/L (ref 3.5–5.1)
Sodium: 144 mmol/L (ref 135–145)
Total Bilirubin: 0.2 mg/dL (ref <1.2)
Total Protein: 6.1 g/dL — ABNORMAL LOW (ref 6.5–8.1)

## 2023-07-04 LAB — CBC WITH DIFFERENTIAL (CANCER CENTER ONLY)
Abs Immature Granulocytes: 0.01 10*3/uL (ref 0.00–0.07)
Basophils Absolute: 0 10*3/uL (ref 0.0–0.1)
Basophils Relative: 1 %
Eosinophils Absolute: 0.1 10*3/uL (ref 0.0–0.5)
Eosinophils Relative: 1 %
HCT: 27.7 % — ABNORMAL LOW (ref 36.0–46.0)
Hemoglobin: 9.3 g/dL — ABNORMAL LOW (ref 12.0–15.0)
Immature Granulocytes: 0 %
Lymphocytes Relative: 35 %
Lymphs Abs: 1.9 10*3/uL (ref 0.7–4.0)
MCH: 32.3 pg (ref 26.0–34.0)
MCHC: 33.6 g/dL (ref 30.0–36.0)
MCV: 96.2 fL (ref 80.0–100.0)
Monocytes Absolute: 0.4 10*3/uL (ref 0.1–1.0)
Monocytes Relative: 7 %
Neutro Abs: 3.1 10*3/uL (ref 1.7–7.7)
Neutrophils Relative %: 56 %
Platelet Count: 258 10*3/uL (ref 150–400)
RBC: 2.88 MIL/uL — ABNORMAL LOW (ref 3.87–5.11)
RDW: 13.2 % (ref 11.5–15.5)
WBC Count: 5.5 10*3/uL (ref 4.0–10.5)
nRBC: 0 % (ref 0.0–0.2)

## 2023-07-04 MED ORDER — SODIUM CHLORIDE 0.9 % IV SOLN
INTRAVENOUS | Status: DC
Start: 2023-07-04 — End: 2023-07-04

## 2023-07-04 MED ORDER — SODIUM CHLORIDE 0.9 % IV SOLN
60.0000 mg | Freq: Once | INTRAVENOUS | Status: AC
  Administered 2023-07-04: 60 mg via INTRAVENOUS
  Filled 2023-07-04: qty 20

## 2023-07-04 MED ORDER — HEPARIN SOD (PORK) LOCK FLUSH 100 UNIT/ML IV SOLN
500.0000 [IU] | Freq: Once | INTRAVENOUS | Status: AC | PRN
  Administered 2023-07-04: 500 [IU]

## 2023-07-04 MED ORDER — SODIUM CHLORIDE 0.9% FLUSH
10.0000 mL | Freq: Once | INTRAVENOUS | Status: AC
  Administered 2023-07-04: 10 mL

## 2023-07-04 NOTE — Progress Notes (Signed)
Per Dr. Candise Che- ok to treat with corrected calcium of 8.4 and serum creatinine of 1.27.

## 2023-07-04 NOTE — Patient Instructions (Signed)
Pamidronate Injection What is this medication? PAMIDRONATE (pa mi DROE nate) treats high calcium levels in the blood caused by cancer. It may also be used with chemotherapy to treat weakened bones caused by cancer. It can also be used to treat Paget's disease of the bone. It works by slowing down the release of calcium from bones. This lowers calcium levels in your blood. It also makes your bones stronger and less likely to break (fracture). It belongs to a group of medications called bisphosphonates. This medicine may be used for other purposes; ask your health care provider or pharmacist if you have questions. COMMON BRAND NAME(S): Aredia What should I tell my care team before I take this medication? They need to know if you have any of these conditions: Bleeding disorder Cancer Dental disease Kidney disease Low levels of calcium or other minerals in the blood Low red blood cell counts Receiving steroids, such as dexamethasone or prednisone An unusual or allergic reaction to pamidronate, other medications, foods, dyes or preservatives Pregnant or trying to get pregnant Breast-feeding How should I use this medication? This medication is injected into a vein. It is given by your care team in a hospital or clinic setting. Talk to your care team about the use of this medication in children. Special care may be needed. Overdosage: If you think you have taken too much of this medicine contact a poison control center or emergency room at once. NOTE: This medicine is only for you. Do not share this medicine with others. What if I miss a dose? Keep appointments for follow-up doses. It is important not to miss your dose. Call your care team if you are unable to keep an appointment. What may interact with this medication? Certain antibiotics given by injection Medications for inflammation or pain, such as ibuprofen, naproxen Some diuretics, such as bumetanide, furosemide Cyclosporine Parathyroid  hormone Tacrolimus Teriparatide Thalidomide This list may not describe all possible interactions. Give your health care provider a list of all the medicines, herbs, non-prescription drugs, or dietary supplements you use. Also tell them if you smoke, drink alcohol, or use illegal drugs. Some items may interact with your medicine. What should I watch for while using this medication? Visit your care team for regular checks on your progress. It may be some time before you see the benefit from this medication. Some people who take this medication have severe bone, joint, or muscle pain. This medication may also increase your risk for jaw problems or a broken thigh bone. Tell your care team right away if you have severe pain in your jaw, bones, joints, or muscles. Tell your care team if you have any pain that does not go away or that gets worse. Tell your dentist and dental surgeon that you are taking this medication. You should not have major dental surgery while on this medication. See your dentist to have a dental exam and fix any dental problems before starting this medication. Take good care of your teeth while on this medication. Make sure you see your dentist for regular follow-up appointments. You should make sure you get enough calcium and vitamin D while you are taking this medication. Discuss the foods you eat and the vitamins you take with your care team. You may need bloodwork while you are taking this medication. Talk to your care team if you wish to become pregnant or think you might be pregnant. This medication can cause serious birth defects. What side effects may I notice from receiving  this medication? Side effects that you should report to your care team as soon as possible: Allergic reactions--skin rash, itching, hives, swelling of the face, lips, tongue, or throat Kidney injury--decrease in the amount of urine, swelling of the ankles, hands, or feet Low calcium level--muscle pain or  cramps, confusion, tingling, or numbness in the hands or feet Osteonecrosis of the jaw--pain, swelling, or redness in the mouth, numbness of the jaw, poor healing after dental work, unusual discharge from the mouth, visible bones in the mouth Severe bone, joint, or muscle pain Side effects that usually do not require medical attention (report to your care team if they continue or are bothersome): Constipation Fatigue Fever Loss of appetite Nausea Pain, redness, or irritation at injection site Stomach pain This list may not describe all possible side effects. Call your doctor for medical advice about side effects. You may report side effects to FDA at 1-800-FDA-1088. Where should I keep my medication? This medication is given in a hospital or clinic. It will not be stored at home. NOTE: This sheet is a summary. It may not cover all possible information. If you have questions about this medicine, talk to your doctor, pharmacist, or health care provider.  2024 Elsevier/Gold Standard (2021-09-03 00:00:00)

## 2023-07-06 ENCOUNTER — Other Ambulatory Visit: Payer: Self-pay | Admitting: Family Medicine

## 2023-07-07 DIAGNOSIS — M170 Bilateral primary osteoarthritis of knee: Secondary | ICD-10-CM | POA: Diagnosis not present

## 2023-07-07 DIAGNOSIS — M1712 Unilateral primary osteoarthritis, left knee: Secondary | ICD-10-CM | POA: Diagnosis not present

## 2023-07-08 ENCOUNTER — Telehealth: Payer: Self-pay | Admitting: Family Medicine

## 2023-07-08 ENCOUNTER — Other Ambulatory Visit: Payer: Self-pay

## 2023-07-08 DIAGNOSIS — C9000 Multiple myeloma not having achieved remission: Secondary | ICD-10-CM

## 2023-07-08 MED ORDER — LIDOCAINE-PRILOCAINE 2.5-2.5 % EX CREA: 1.0000 | TOPICAL_CREAM | CUTANEOUS | 0 refills | Status: DC | PRN

## 2023-07-08 NOTE — Telephone Encounter (Signed)
Encourage patient to contact the pharmacy for refills or they can request refills through [facility]  (Please schedule appointment if patient has not been seen in over a year)    WHAT PHARMACY WOULD THEY LIKE THIS SENT TO: [facility] - [place], [place] - [address]   MEDICATION NAME & DOSE:  loratadine (CLARITIN) 10 MG tablet   NOTES/COMMENTS FROM PATIENT: Pt is asking if this prescription can be re-sent to the pharmacy. [facility] is telling the pt she cannot get this filled until 12/13 and she states her eyes have been watering so badly that she couldn't read the labels on the shelf yesterday at the store. And now her eyes are sore from wiping them so much. I told pt what was sent to [facility] on 11/18 which was 90 tab 1 refill so if they filled it correctly she shouldn't need a refill yet.     Front office please notify patient: It takes 48-72 hours to process rx refill requests Ask patient to call pharmacy to ensure rx is ready before heading there.

## 2023-07-08 NOTE — Telephone Encounter (Signed)
Caller name: AAIZA KALMAR  On DPR?: Yes  Call back number: [phone] (mobile)  Provider they see: [name], MD  Reason for call:   States returning call - problem with phone only ringing 2 times???

## 2023-07-08 NOTE — Telephone Encounter (Signed)
Called pt 2x -Busy signal Last refilled 06/16/2023 90 day w/1 refill.

## 2023-07-08 NOTE — Telephone Encounter (Signed)
Called pt and no answer. Unable to leave vm.

## 2023-07-09 ENCOUNTER — Telehealth: Payer: Self-pay | Admitting: Family Medicine

## 2023-07-09 MED ORDER — LEVOCETIRIZINE DIHYDROCHLORIDE 5 MG PO TABS: 5.0000 mg | ORAL_TABLET | Freq: Every evening | ORAL | 1 refills | Status: DC

## 2023-07-09 NOTE — Telephone Encounter (Signed)
Called [facility] home delivery and their Pharmacist confirmed patient cannot have this filled because it is available OTC and insurance will not pay for it, she will have to go to the local pharmacy and purchase this without a prescription.

## 2023-07-09 NOTE — Telephone Encounter (Signed)
Pt has been notified.

## 2023-07-09 NOTE — Telephone Encounter (Signed)
Message from Tori Milks 07/08/2023 @ 12:44pm  Caller name: Lance Sell   On DPR?: Yes   Call back number: [phone] (mobile)   Provider they see: [name], MD   Reason for call:    States returning call - problem with phone only ringing 2 times???

## 2023-07-09 NOTE — Telephone Encounter (Signed)
Prescription sent to [facility] 

## 2023-07-09 NOTE — Telephone Encounter (Signed)
Spoke with patient and informed her of the insurance issue and patient will reach out to [facility] and see if she can still get it filled through them and just pay the OTC pricing

## 2023-07-09 NOTE — Telephone Encounter (Signed)
Called patient back and per her call from [facility] they can do "Levocetirizine" with a prescription rather then making patient purchase OTC bottles of Loratadine.  Please advise if this is okay

## 2023-07-09 NOTE — Telephone Encounter (Signed)
Caller name: LORRANE SURBER  On DPR?: Yes  Call back number: [phone]  Provider they see: [name], MD  Reason for call:   Pt states she spoke to [facility] about the med for her allergies loratadine (CLARITIN) 10 MG tablet not being covered by [facility] [place], [place] - [address] so they will cover a different allergy med (she can't pronouce or spell) but they will fill with an expedited order per Ms Buescher.

## 2023-07-10 ENCOUNTER — Other Ambulatory Visit: Payer: Self-pay

## 2023-07-10 DIAGNOSIS — C9000 Multiple myeloma not having achieved remission: Secondary | ICD-10-CM

## 2023-07-10 MED ORDER — LIDOCAINE-PRILOCAINE 2.5-2.5 % EX CREA: 1.0000 | TOPICAL_CREAM | CUTANEOUS | 0 refills | Status: DC | PRN

## 2023-07-13 ENCOUNTER — Emergency Department (HOSPITAL_COMMUNITY): Payer: Medicare Other

## 2023-07-13 ENCOUNTER — Other Ambulatory Visit: Payer: Self-pay

## 2023-07-13 ENCOUNTER — Observation Stay (HOSPITAL_COMMUNITY)
Admission: EM | Admit: 2023-07-13 | Discharge: 2023-07-15 | Disposition: A | Discharge status: Home Health | Payer: Medicare Other | Attending: Internal Medicine | Admitting: Internal Medicine

## 2023-07-13 DIAGNOSIS — Z853 Personal history of malignant neoplasm of breast: Secondary | ICD-10-CM | POA: Diagnosis not present

## 2023-07-13 DIAGNOSIS — Z7901 Long term (current) use of anticoagulants: Secondary | ICD-10-CM | POA: Insufficient documentation

## 2023-07-13 DIAGNOSIS — Z91040 Latex allergy status: Secondary | ICD-10-CM | POA: Diagnosis not present

## 2023-07-13 DIAGNOSIS — R10816 Epigastric abdominal tenderness: Secondary | ICD-10-CM | POA: Diagnosis not present

## 2023-07-13 DIAGNOSIS — R590 Localized enlarged lymph nodes: Secondary | ICD-10-CM | POA: Diagnosis not present

## 2023-07-13 DIAGNOSIS — I129 Hypertensive chronic kidney disease with stage 1 through stage 4 chronic kidney disease, or unspecified chronic kidney disease: Secondary | ICD-10-CM | POA: Insufficient documentation

## 2023-07-13 DIAGNOSIS — Z1152 Encounter for screening for COVID-19: Secondary | ICD-10-CM | POA: Diagnosis not present

## 2023-07-13 DIAGNOSIS — R0789 Other chest pain: Secondary | ICD-10-CM | POA: Diagnosis not present

## 2023-07-13 DIAGNOSIS — R079 Chest pain, unspecified: Secondary | ICD-10-CM | POA: Diagnosis not present

## 2023-07-13 DIAGNOSIS — R519 Headache, unspecified: Secondary | ICD-10-CM | POA: Diagnosis not present

## 2023-07-13 DIAGNOSIS — G4489 Other headache syndrome: Secondary | ICD-10-CM | POA: Diagnosis not present

## 2023-07-13 DIAGNOSIS — Z8673 Personal history of transient ischemic attack (TIA), and cerebral infarction without residual deficits: Secondary | ICD-10-CM | POA: Insufficient documentation

## 2023-07-13 DIAGNOSIS — I700 Atherosclerosis of aorta: Secondary | ICD-10-CM | POA: Diagnosis not present

## 2023-07-13 DIAGNOSIS — E785 Hyperlipidemia, unspecified: Secondary | ICD-10-CM | POA: Diagnosis not present

## 2023-07-13 DIAGNOSIS — N1832 Chronic kidney disease, stage 3b: Secondary | ICD-10-CM | POA: Diagnosis not present

## 2023-07-13 DIAGNOSIS — I2510 Atherosclerotic heart disease of native coronary artery without angina pectoris: Secondary | ICD-10-CM | POA: Insufficient documentation

## 2023-07-13 DIAGNOSIS — C9000 Multiple myeloma not having achieved remission: Secondary | ICD-10-CM | POA: Diagnosis not present

## 2023-07-13 DIAGNOSIS — K5730 Diverticulosis of large intestine without perforation or abscess without bleeding: Secondary | ICD-10-CM | POA: Diagnosis not present

## 2023-07-13 DIAGNOSIS — R55 Syncope and collapse: Principal | ICD-10-CM | POA: Insufficient documentation

## 2023-07-13 DIAGNOSIS — Z7984 Long term (current) use of oral hypoglycemic drugs: Secondary | ICD-10-CM | POA: Diagnosis not present

## 2023-07-13 DIAGNOSIS — Z79899 Other long term (current) drug therapy: Secondary | ICD-10-CM | POA: Diagnosis not present

## 2023-07-13 DIAGNOSIS — R42 Dizziness and giddiness: Secondary | ICD-10-CM | POA: Diagnosis not present

## 2023-07-13 DIAGNOSIS — R531 Weakness: Secondary | ICD-10-CM | POA: Diagnosis not present

## 2023-07-13 DIAGNOSIS — R6889 Other general symptoms and signs: Secondary | ICD-10-CM | POA: Diagnosis not present

## 2023-07-13 DIAGNOSIS — R778 Other specified abnormalities of plasma proteins: Secondary | ICD-10-CM | POA: Diagnosis not present

## 2023-07-13 DIAGNOSIS — Z743 Need for continuous supervision: Secondary | ICD-10-CM | POA: Diagnosis not present

## 2023-07-13 DIAGNOSIS — R7989 Other specified abnormal findings of blood chemistry: Secondary | ICD-10-CM

## 2023-07-13 LAB — CBC
HCT: 30.1 % — ABNORMAL LOW (ref 36.0–46.0)
Hemoglobin: 9.9 g/dL — ABNORMAL LOW (ref 12.0–15.0)
MCH: 31.5 pg (ref 26.0–34.0)
MCHC: 32.9 g/dL (ref 30.0–36.0)
MCV: 95.9 fL (ref 80.0–100.0)
Platelets: 326 10*3/uL (ref 150–400)
RBC: 3.14 MIL/uL — ABNORMAL LOW (ref 3.87–5.11)
RDW: 13.2 % (ref 11.5–15.5)
WBC: 7.5 10*3/uL (ref 4.0–10.5)
nRBC: 0 % (ref 0.0–0.2)

## 2023-07-13 LAB — D-DIMER, QUANTITATIVE: D-Dimer, Quant: 0.49 ug{FEU}/mL (ref 0.00–0.50)

## 2023-07-13 LAB — COMPREHENSIVE METABOLIC PANEL
ALT: 12 U/L (ref 0–44)
AST: 16 U/L (ref 15–41)
Albumin: 3.2 g/dL — ABNORMAL LOW (ref 3.5–5.0)
Alkaline Phosphatase: 70 U/L (ref 38–126)
Anion gap: 11 (ref 5–15)
BUN: 9 mg/dL (ref 8–23)
CO2: 22 mmol/L (ref 22–32)
Calcium: 9.3 mg/dL (ref 8.9–10.3)
Chloride: 104 mmol/L (ref 98–111)
Creatinine, Ser: 1.35 mg/dL — ABNORMAL HIGH (ref 0.44–1.00)
GFR, Estimated: 40 mL/min — ABNORMAL LOW (ref >60)
Glucose, Bld: 112 mg/dL — ABNORMAL HIGH (ref 70–99)
Potassium: 3.9 mmol/L (ref 3.5–5.1)
Sodium: 137 mmol/L (ref 135–145)
Total Bilirubin: 0.5 mg/dL (ref <1.2)
Total Protein: 6.2 g/dL — ABNORMAL LOW (ref 6.5–8.1)

## 2023-07-13 LAB — RESP PANEL BY RT-PCR (RSV, FLU A&B, COVID)  RVPGX2
Influenza A by PCR: NEGATIVE
Influenza B by PCR: NEGATIVE
Resp Syncytial Virus by PCR: NEGATIVE
SARS Coronavirus 2 by RT PCR: NEGATIVE

## 2023-07-13 LAB — TROPONIN I (HIGH SENSITIVITY)
Troponin I (High Sensitivity): 94 ng/L — ABNORMAL HIGH (ref <18)
Troponin I (High Sensitivity): 81 ng/L — ABNORMAL HIGH (ref <18)

## 2023-07-13 LAB — LIPASE, BLOOD: Lipase: 43 U/L (ref 11–51)

## 2023-07-13 MED ORDER — ONDANSETRON HCL 4 MG/2ML IJ SOLN
4.0000 mg | Freq: Four times a day (QID) | INTRAMUSCULAR | Status: DC | PRN
Start: 2023-07-13 — End: 2023-07-15

## 2023-07-13 MED ORDER — DIPHENHYDRAMINE HCL 25 MG PO CAPS: 50.0000 mg | ORAL_CAPSULE | Freq: Once | ORAL | Status: AC

## 2023-07-13 MED ORDER — ACYCLOVIR 400 MG PO TABS
400.0000 mg | ORAL_TABLET | Freq: Two times a day (BID) | ORAL | Status: DC
  Administered 2023-07-13 – 2023-07-15 (×4): 400 mg via ORAL
  Filled 2023-07-13 (×5): qty 1

## 2023-07-13 MED ORDER — TIZANIDINE HCL 4 MG PO TABS
4.0000 mg | ORAL_TABLET | Freq: Every day | ORAL | Status: DC
  Administered 2023-07-13 – 2023-07-14 (×2): 4 mg via ORAL
  Filled 2023-07-13 (×2): qty 1

## 2023-07-13 MED ORDER — GABAPENTIN 300 MG PO CAPS
600.0000 mg | ORAL_CAPSULE | Freq: Three times a day (TID) | ORAL | Status: DC
  Administered 2023-07-13 – 2023-07-15 (×6): 600 mg via ORAL
  Filled 2023-07-13 (×6): qty 2

## 2023-07-13 MED ORDER — ENOXAPARIN SODIUM 40 MG/0.4ML IJ SOSY
40.0000 mg | PREFILLED_SYRINGE | INTRAMUSCULAR | Status: DC
  Administered 2023-07-13 – 2023-07-14 (×2): 40 mg via SUBCUTANEOUS
  Filled 2023-07-13 (×2): qty 0.4

## 2023-07-13 MED ORDER — PROCHLORPERAZINE EDISYLATE 10 MG/2ML IJ SOLN
10.0000 mg | Freq: Once | INTRAMUSCULAR | Status: AC
Start: 2023-07-13 — End: 2023-07-13
  Administered 2023-07-13: 10 mg via INTRAVENOUS
  Filled 2023-07-13: qty 2

## 2023-07-13 MED ORDER — PANTOPRAZOLE SODIUM 40 MG PO TBEC
40.0000 mg | DELAYED_RELEASE_TABLET | Freq: Two times a day (BID) | ORAL | Status: DC
  Administered 2023-07-13 – 2023-07-15 (×4): 40 mg via ORAL
  Filled 2023-07-13 (×4): qty 1

## 2023-07-13 MED ORDER — ACETAMINOPHEN 650 MG RE SUPP: 650.0000 mg | Freq: Four times a day (QID) | RECTAL | Status: DC | PRN

## 2023-07-13 MED ORDER — IOHEXOL 350 MG/ML SOLN
75.0000 mL | Freq: Once | INTRAVENOUS | Status: AC | PRN
  Administered 2023-07-13: 75 mL via INTRAVENOUS

## 2023-07-13 MED ORDER — ATORVASTATIN CALCIUM 10 MG PO TABS
10.0000 mg | ORAL_TABLET | Freq: Every day | ORAL | Status: DC
  Administered 2023-07-14 – 2023-07-15 (×2): 10 mg via ORAL
  Filled 2023-07-13 (×2): qty 1

## 2023-07-13 MED ORDER — OXYCODONE HCL 5 MG PO TABS
5.0000 mg | ORAL_TABLET | ORAL | Status: DC | PRN
Start: 2023-07-13 — End: 2023-07-15

## 2023-07-13 MED ORDER — ONDANSETRON HCL 4 MG PO TABS: 4.0000 mg | ORAL_TABLET | Freq: Four times a day (QID) | ORAL | Status: DC | PRN

## 2023-07-13 MED ORDER — ACETAMINOPHEN 325 MG PO TABS
650.0000 mg | ORAL_TABLET | Freq: Four times a day (QID) | ORAL | Status: DC | PRN
  Administered 2023-07-13: 650 mg via ORAL
  Filled 2023-07-13: qty 2

## 2023-07-13 MED ORDER — METHYLPREDNISOLONE SODIUM SUCC 40 MG IJ SOLR
40.0000 mg | Freq: Once | INTRAMUSCULAR | Status: AC
  Administered 2023-07-13: 40 mg via INTRAVENOUS
  Filled 2023-07-13: qty 1

## 2023-07-13 MED ORDER — TRAZODONE HCL 50 MG PO TABS
100.0000 mg | ORAL_TABLET | Freq: Every day | ORAL | Status: DC
  Administered 2023-07-13 – 2023-07-14 (×2): 100 mg via ORAL
  Filled 2023-07-13 (×2): qty 2

## 2023-07-13 MED ORDER — ACETAMINOPHEN 500 MG PO TABS
1000.0000 mg | ORAL_TABLET | Freq: Once | ORAL | Status: AC
  Administered 2023-07-13: 1000 mg via ORAL
  Filled 2023-07-13: qty 2

## 2023-07-13 MED ORDER — DIPHENHYDRAMINE HCL 50 MG/ML IJ SOLN
50.0000 mg | Freq: Once | INTRAMUSCULAR | Status: AC
  Administered 2023-07-13: 50 mg via INTRAVENOUS
  Filled 2023-07-13: qty 1

## 2023-07-13 MED ORDER — HYDRALAZINE HCL 20 MG/ML IJ SOLN
10.0000 mg | Freq: Once | INTRAMUSCULAR | Status: DC
  Filled 2023-07-13: qty 1

## 2023-07-13 NOTE — ED Notes (Signed)
RN to draw 2nd Troponin.

## 2023-07-13 NOTE — H&P (Signed)
History and Physical    Tracy [name] QIO:[id number] DOB: 09/27/1945 DOA: 07/13/2023  PCP: [name], MD   Chief Complaint: cp  HPI: Tracy [name] is a 77 y.o. female with medical history significant of breast cancer, hypertension, hyperlipidemia, CAD, multiple myeloma who presents emergency department after syncopal episode.  Patient is been endorsing persistent chest pain and epigastrium for the last week.  She suddenly lost consciousness without any warning fell backwards on the callus without any trauma.  She presented to the ER due to her syncopal episode.  She was found to be afebrile and hemodynamically stable.  Initially she was hypertensive with systolics in 180s and was given hydralazine.  Her chest pain was reproducible to palpation.  LAbs were obtained which showed WBC 7.5, hemoglobin 9.9, creatinine 1.35, troponin 94, 81.  Patient had CT head which showed no acute intracranial abnormalities.  CTA chest abdomen pelvis showed no evidence of dissection.  On bedside evaluation patient states that she has been having persistent chest pain for the last week that is reproducible palpation and positional.  No exertional component.  There was no preceding episodes were vasovagal syncope.   Review of Systems: Review of Systems  Constitutional:  Negative for chills and fever.  HENT: Negative.    Eyes: Negative.   Respiratory: Negative.    Cardiovascular: Negative.   Gastrointestinal: Negative.   Genitourinary: Negative.   Musculoskeletal: Negative.   Neurological:  Positive for headaches.  Endo/Heme/Allergies: Negative.   Psychiatric/Behavioral: Negative.    All other systems reviewed and are negative.    As per HPI otherwise 10 point review of systems negative.   Allergies  Allergen Reactions   Bacitracin-Neomycin-Polymyxin  [Neomycin-Bacitracin Zn-Polymyx] Swelling   Nsaids Other (See Comments)    nosebleeds   Tape Hives    Pt cannot tolerate bandaids, tape, or  any other adhesives.    Ambien [Zolpidem Tartrate]     Hives    Amoxicillin     Rash    Clavulanic Acid Hives   Contrast Media [Iodinated Contrast Media] Hives    Pt states hives with prior ct, was given benadryl to resolve   Latex Swelling   Prednisone Swelling    Pt tolerates Dexamethasone. Throat swelling   Tessalon [Benzonatate] Itching and Rash    Past Medical History:  Diagnosis Date   Angiodysplasia of intestine 08/23/2020   Anxiety    Arthritis    Breast cancer (HCC) 06/13/15   Cancer (HCC) 2000   breast cancer   Chronic bronchitis (HCC)    Chronic bronchitis (HCC)    Hyperlipidemia    Hypertension    Myocardial infarction [facility]) 2001   Personal history of radiation therapy    Restless leg    Stroke (HCC) 2004   TIA, no deficits    Past Surgical History:  Procedure Laterality Date   ABDOMINAL HYSTERECTOMY  1985   BREAST LUMPECTOMY Left 2000   radiation and chemo   BREAST LUMPECTOMY Right 2016   radiation   BREAST SURGERY  2001   lt breast lumpectomy   COLONOSCOPY WITH ESOPHAGOGASTRODUODENOSCOPY (EGD)  04/2020   ENTEROSCOPY N/A 03/15/2021   Procedure: ENTEROSCOPY;  Surgeon: Rachael Fee, MD;  Location: [facility];  Service: Endoscopy;  Laterality: N/A;   GIVENS CAPSULE STUDY  07/2020   HOT HEMOSTASIS N/A 03/15/2021   Procedure: HOT HEMOSTASIS (ARGON PLASMA COAGULATION/BICAP);  Surgeon: Rachael Fee, MD;  Location: [facility];  Service: Endoscopy;  Laterality: N/A;   IR IMAGING  GUIDED PORT INSERTION  04/25/2020   RADIOACTIVE SEED GUIDED PARTIAL MASTECTOMY WITH AXILLARY SENTINEL LYMPH NODE BIOPSY Right 07/07/2015   Procedure: RIGHT RADIOACTIVE SEED GUIDED PARTIAL MASTECTOMY WITH AXILLARY SENTINEL LYMPH NODE BIOPSY;  Surgeon: Chevis Pretty III, MD;  Location: [facility];  Service: General;  Laterality: Right;   SMALL INTESTINE SURGERY     TUBAL LIGATION     UPPER GASTROINTESTINAL ENDOSCOPY       reports that she quit smoking about 11  years ago. Her smoking use included cigarettes. She started smoking about 31 years ago. She has a 20 pack-year smoking history. She has never used smokeless tobacco. She reports that she does not drink alcohol and does not use drugs.  Family History  Problem Relation Age of Onset   Emphysema Mother 37       smoker   Diabetes Father    Lung cancer Sister        dx. <50; former smoker   Diabetes Brother    Diabetes Brother    Brain cancer Brother 71       unknown tumor type   Diabetes Paternal Aunt    Stroke Maternal Grandmother    Diabetes Paternal Grandmother    Cancer Daughter 45       neck cancer   Other Daughter        hysterectomy for unspecified reason   Colon cancer Daughter    Breast cancer Cousin    Cancer Cousin        unspecified type   Breast cancer Other        triple negative breast cancer in her 70s   Colon polyps Neg Hx    Esophageal cancer Neg Hx    Gallbladder disease Neg Hx     Prior to Admission medications   Medication Sig Start Date End Date Taking? Authorizing Provider  acyclovir (ZOVIRAX) 400 MG tablet TAKE 1 TABLET BY MOUTH TWICE  DAILY 12/02/22  Yes Johney Maine, MD  atorvastatin (LIPITOR) 10 MG tablet TAKE 1 TABLET BY MOUTH DAILY 01/27/23  Yes [name], MD  gabapentin (NEURONTIN) 300 MG capsule Take 2 capsules (600 mg total) by mouth 3 (three) times daily. 01/31/23 04/25/24 Yes [name], MD  lidocaine (LIDODERM) 5 % Place 1 patch onto the skin daily. Remove & Discard patch within 12 hours or as directed by MD Patient taking differently: Place 1 patch onto the skin daily as needed (for pain). 07/03/23  Yes Johney Maine, MD  lidocaine-prilocaine (EMLA) cream Apply 1 Application topically as needed (Apply 1 application to port site 1 hour prior to access as needed). 07/10/23  Yes Johney Maine, MD  meclizine (ANTIVERT) 25 MG tablet Take 1 tablet (25 mg total) by mouth 3 (three) times daily as needed for dizziness.  03/08/21  Yes Causey, Larna Daughters, NP  metFORMIN (GLUCOPHAGE) 500 MG tablet TAKE 1 TABLET BY MOUTH  TWICE DAILY WITH A MEAL 02/13/23  Yes [name], MD  ondansetron (ZOFRAN) 8 MG tablet Take 1 tablet (8 mg total) by mouth every 8 (eight) hours as needed for nausea or vomiting. 01/31/23  Yes Georga Kaufmann T, PA-C  pantoprazole (PROTONIX) 40 MG tablet TAKE 1 TABLET BY MOUTH TWICE  DAILY 07/07/23  Yes [name], MD  tiZANidine (ZANAFLEX) 4 MG tablet TAKE 1 TABLET BY MOUTH AT  BEDTIME 04/10/22  Yes [name], MD  traZODone (DESYREL) 100 MG tablet TAKE 1 TABLET BY MOUTH AT  BEDTIME 04/01/23  Yes [name], MD  Vitamin D, Ergocalciferol, (DRISDOL) 1.25 MG (50000 UNIT) CAPS capsule TAKE 1 CAPSULE BY MOUTH ONCE  WEEKLY 11/06/22  Yes Johney Maine, MD  glucose blood (ACCU-CHEK GUIDE) test strip Use as instructed to check sugars 1-2 times daily. 02/10/20   [name], MD    Physical Exam: Vitals:   07/13/23 1445 07/13/23 1530 07/13/23 1715 07/13/23 1941  BP: (!) 190/74 (!) 183/88 (!) 143/48   Pulse: 69 70 72   Resp: 12 20 20    Temp:    98.6 F (37 C)  TempSrc:    Oral  SpO2: 100% 99% 99%   Weight:      Height:       Physical Exam Constitutional:      Appearance: She is normal weight.  HENT:     Head: Normocephalic.  Eyes:     Pupils: Pupils are equal, round, and reactive to light.  Cardiovascular:     Rate and Rhythm: Normal rate and regular rhythm.     Heart sounds: Normal heart sounds.  Pulmonary:     Breath sounds: Normal breath sounds.  Abdominal:     General: Bowel sounds are normal.     Palpations: Abdomen is soft.  Musculoskeletal:        General: Normal range of motion.     Cervical back: Normal range of motion.  Skin:    General: Skin is warm.     Capillary Refill: Capillary refill takes less than 2 seconds.  Neurological:     General: No focal deficit present.     Mental Status: She is alert.       Labs on  Admission: I have personally reviewed the patients's labs and imaging studies.  Assessment/Plan Principal Problem:   Syncope Active Problems:   Syncope and collapse   # Syncopal episode, unclear etiology - Does not sound vasovagal as no prodromal state - Sudden onset most likely cardiogenic in etiology - No neurologic changes  Plan: Obtain echocardiogram Monitor on telemetry  # Noncardiac chest pain-patient's troponins were elevated however downtrending.  Patient's chest pain is reproducible and has been constant for the last week without exertional component.  Will plan to continue to monitor and start protonix  # Insomnia-continue trazodone, gabapentin  # Hyperlipidemia-continue atorvastatin  # History of multiple myeloma-continue to monitor  # History of breast cancer-continue to monitor   Admission status: Observation Telemetry Medical  Certification: The appropriate patient status for this patient is OBSERVATION. Observation status is judged to be reasonable and necessary in order to provide the required intensity of service to ensure the patient's safety. The patient's presenting symptoms, physical exam findings, and initial radiographic and laboratory data in the context of their medical condition is felt to place them at decreased risk for further clinical deterioration. Furthermore, it is anticipated that the patient will be medically stable for discharge from the hospital within 2 midnights of admission.     Alan Mulder MD [facility] If 7PM-7AM, please contact night-coverage [email or web address]  07/13/2023, 9:03 PM

## 2023-07-13 NOTE — Progress Notes (Signed)
New Admission Note Arrival Method: By stretcher from ED at this time Mental Orientation: Alert and oriented x 4 Telemetry: Box 20, CCMD notified Assessment: Completed Skin: Completed, refer to flowsheets IV: 2 right forearm S.L. Pain: Denies Tubes: None Safety Measures: Safety Fall Prevention Plan was given, discussed and signed. Admission: Completed [facility] Orientation: Patient has been orientated to the room, unit and the staff. Family: None  Orders have been reviewed and implemented. Will continue to monitor the patient. Call light has been placed within reach and bed alarm has been activated.   Franky Macho, RN  Phone Number: [phone]

## 2023-07-13 NOTE — ED Notes (Signed)
ED TO INPATIENT HANDOFF REPORT  ED Nurse Name and Phone #: Norlene Duel [phone]  S Name/Age/Gender Tracy [name] 77 y.o. female Room/Bed: 007C/007C  Code Status   Code Status: Full Code  Home/SNF/Other Home Patient oriented to: self, place, time, and situation Is this baseline? Yes   Triage Complete: Triage complete  Chief Complaint Syncope [R55] Syncope and collapse [R55]  Triage Note Pt to the ed from home via ems. Pt relay she started getting chest pain while sitting on couch. Pt stood up from sitting and felt dizzy and fell back on couch. Pt did not hit head, or have loc, no blood thinners. Pt is having a head ache.    Allergies Allergies  Allergen Reactions   Bacitracin-Neomycin-Polymyxin  [Neomycin-Bacitracin Zn-Polymyx] Swelling   Nsaids Other (See Comments)    nosebleeds   Tape Hives    Pt cannot tolerate bandaids, tape, or any other adhesives.    Ambien [Zolpidem Tartrate]     Hives    Amoxicillin     Rash    Clavulanic Acid Hives   Contrast Media [Iodinated Contrast Media] Hives    Pt states hives with prior ct, was given benadryl to resolve   Latex Swelling   Prednisone Swelling    Pt tolerates Dexamethasone. Throat swelling   Tessalon [Benzonatate] Itching and Rash    Level of Care/Admitting Diagnosis ED Disposition     ED Disposition  Admit   Condition  --   Comment  Hospital Area: [facility] [[id number]]  Level of Care: Telemetry Medical [104]  May place patient in observation at [facility] or [facility] if equivalent level of care is available:: Yes  Covid Evaluation: Asymptomatic - no recent exposure (last 10 days) testing not required  Diagnosis: Syncope and collapse [780.2.ICD-9-CM]  Admitting Physician: Alan Mulder [[id number]]  Attending Physician: Alan Mulder [[id number]]          B Medical/Surgery History Past Medical History:  Diagnosis Date   Angiodysplasia of intestine 08/23/2020   Anxiety     Arthritis    Breast cancer (HCC) 06/13/15   Cancer (HCC) 2000   breast cancer   Chronic bronchitis (HCC)    Chronic bronchitis (HCC)    Hyperlipidemia    Hypertension    Myocardial infarction [facility]) 2001   Personal history of radiation therapy    Restless leg    Stroke (HCC) 2004   TIA, no deficits   Past Surgical History:  Procedure Laterality Date   ABDOMINAL HYSTERECTOMY  1985   BREAST LUMPECTOMY Left 2000   radiation and chemo   BREAST LUMPECTOMY Right 2016   radiation   BREAST SURGERY  2001   lt breast lumpectomy   COLONOSCOPY WITH ESOPHAGOGASTRODUODENOSCOPY (EGD)  04/2020   ENTEROSCOPY N/A 03/15/2021   Procedure: ENTEROSCOPY;  Surgeon: Rachael Fee, MD;  Location: [facility];  Service: Endoscopy;  Laterality: N/A;   GIVENS CAPSULE STUDY  07/2020   HOT HEMOSTASIS N/A 03/15/2021   Procedure: HOT HEMOSTASIS (ARGON PLASMA COAGULATION/BICAP);  Surgeon: Rachael Fee, MD;  Location: [facility];  Service: Endoscopy;  Laterality: N/A;   IR IMAGING GUIDED PORT INSERTION  04/25/2020   RADIOACTIVE SEED GUIDED PARTIAL MASTECTOMY WITH AXILLARY SENTINEL LYMPH NODE BIOPSY Right 07/07/2015   Procedure: RIGHT RADIOACTIVE SEED GUIDED PARTIAL MASTECTOMY WITH AXILLARY SENTINEL LYMPH NODE BIOPSY;  Surgeon: Chevis Pretty III, MD;  Location: [facility];  Service: General;  Laterality: Right;   SMALL INTESTINE SURGERY  TUBAL LIGATION     UPPER GASTROINTESTINAL ENDOSCOPY       A IV Location/Drains/Wounds Patient Lines/Drains/Airways Status     Active Line/Drains/Airways     Name Placement date Placement time Site Days   Implanted Port 04/25/20 Right Chest 04/25/20  1440  Chest  1174   Peripheral IV 07/13/23 22 G Anterior;Distal;Right Forearm 07/13/23  1433  Forearm  less than 1   Peripheral IV 07/13/23 20 G 1.88" Right Forearm 07/13/23  1540  Forearm  less than 1   Airway 03/15/21  0726  -- 850            Intake/Output Last 24 hours No intake or output  data in the 24 hours ending 07/13/23 2145  Labs/Imaging Results for orders placed or performed during the hospital encounter of 07/13/23 (from the past 48 hours)  Resp panel by RT-PCR (RSV, Flu A&B, Covid) Anterior Nasal Swab     Status: None   Collection Time: 07/13/23  2:33 PM   Specimen: Anterior Nasal Swab  Result Value Ref Range   SARS Coronavirus 2 by RT PCR NEGATIVE NEGATIVE   Influenza A by PCR NEGATIVE NEGATIVE   Influenza B by PCR NEGATIVE NEGATIVE    Comment: (NOTE) The Xpert Xpress SARS-CoV-2/FLU/RSV plus assay is intended as an aid in the diagnosis of influenza from Nasopharyngeal swab specimens and should not be used as a sole basis for treatment. Nasal washings and aspirates are unacceptable for Xpert Xpress SARS-CoV-2/FLU/RSV testing.  Fact Sheet for Patients: BloggerCourse.com  Fact Sheet for Healthcare Providers: SeriousBroker.it  This test is not yet approved or cleared by the [place] FDA and has been authorized for detection and/or diagnosis of SARS-CoV-2 by FDA under an Emergency Use Authorization (EUA). This EUA will remain in effect (meaning this test can be used) for the duration of the COVID-19 declaration under Section 564(b)(1) of the Act, 21 U.S.C. section 360bbb-3(b)(1), unless the authorization is terminated or revoked.     Resp Syncytial Virus by PCR NEGATIVE NEGATIVE    Comment: (NOTE) Fact Sheet for Patients: BloggerCourse.com  Fact Sheet for Healthcare Providers: SeriousBroker.it  This test is not yet approved or cleared by the [place] FDA and has been authorized for detection and/or diagnosis of SARS-CoV-2 by FDA under an Emergency Use Authorization (EUA). This EUA will remain in effect (meaning this test can be used) for the duration of the COVID-19 declaration under Section 564(b)(1) of the Act, 21 U.S.C. section  360bbb-3(b)(1), unless the authorization is terminated or revoked.  Performed at [facility], [address] [place], [place] [address]   CBC     Status: Abnormal   Collection Time: 07/13/23  3:11 PM  Result Value Ref Range   WBC 7.5 4.0 - 10.5 K/uL   RBC 3.14 (L) 3.87 - 5.11 MIL/uL   Hemoglobin 9.9 (L) 12.0 - 15.0 g/dL   HCT 60.4 (L) 54.0 - 98.1 %   MCV 95.9 80.0 - 100.0 fL   MCH 31.5 26.0 - 34.0 pg   MCHC 32.9 30.0 - 36.0 g/dL   RDW 19.1 47.8 - 29.5 %   Platelets 326 150 - 400 K/uL   nRBC 0.0 0.0 - 0.2 %    Comment: Performed at [facility], [address] [place], [place] [address]  D-dimer, quantitative     Status: None   Collection Time: 07/13/23  3:11 PM  Result Value Ref Range   D-Dimer, Quant 0.49 0.00 - 0.50 ug/mL-FEU  Comment: (NOTE) At the manufacturer cut-off value of 0.5 g/mL FEU, this assay has a negative predictive value of 95-100%.This assay is intended for use in conjunction with a clinical pretest probability (PTP) assessment model to exclude pulmonary embolism (PE) and deep venous thrombosis (DVT) in outpatients suspected of PE or DVT. Results should be correlated with clinical presentation. Performed at [facility], [address] [place], [place] [address]   Comprehensive metabolic panel     Status: Abnormal   Collection Time: 07/13/23  3:11 PM  Result Value Ref Range   Sodium 137 135 - 145 mmol/L   Potassium 3.9 3.5 - 5.1 mmol/L   Chloride 104 98 - 111 mmol/L   CO2 22 22 - 32 mmol/L   Glucose, Bld 112 (H) 70 - 99 mg/dL    Comment: Glucose reference range applies only to samples taken after fasting for at least 8 hours.   BUN 9 8 - 23 mg/dL   Creatinine, Ser 6.04 (H) 0.44 - 1.00 mg/dL   Calcium 9.3 8.9 - 54.0 mg/dL   Total Protein 6.2 (L) 6.5 - 8.1 g/dL   Albumin 3.2 (L) 3.5 - 5.0 g/dL   AST 16 15 - 41 U/L   ALT 12 0 - 44 U/L   Alkaline Phosphatase 70 38 - 126 U/L   Total Bilirubin 0.5 <1.2 mg/dL   GFR, Estimated 40  (L) >60 mL/min    Comment: (NOTE) Calculated using the CKD-EPI Creatinine Equation (2021)    Anion gap 11 5 - 15    Comment: Performed at [facility], [address], [place], [place] [address]  Troponin I (High Sensitivity)     Status: Abnormal   Collection Time: 07/13/23  3:11 PM  Result Value Ref Range   Troponin I (High Sensitivity) 94 (H) <18 ng/L    Comment: (NOTE) Elevated high sensitivity troponin I (hsTnI) values and significant  changes across serial measurements may suggest ACS but many other  chronic and acute conditions are known to elevate hsTnI results.  Refer to the "Links" section for chest pain algorithms and additional  guidance. Performed at [facility], [address] [place], [place] [address]   Lipase, blood     Status: None   Collection Time: 07/13/23  3:11 PM  Result Value Ref Range   Lipase 43 11 - 51 U/L    Comment: Performed at [facility], [address], [place], [place] [address]  Troponin I (High Sensitivity)     Status: Abnormal   Collection Time: 07/13/23  5:15 PM  Result Value Ref Range   Troponin I (High Sensitivity) 81 (H) <18 ng/L    Comment: (NOTE) Elevated high sensitivity troponin I (hsTnI) values and significant  changes across serial measurements may suggest ACS but many other  chronic and acute conditions are known to elevate hsTnI results.  Refer to the "Links" section for chest pain algorithms and additional  guidance. Performed at [facility], [address] [place], [place] [address]    *Note: Due to a large number of results and/or encounters for the requested time period, some results have not been displayed. A complete set of results can be found in Results Review.   CT Angio Chest/Abd/Pel for Dissection W and/or Wo Contrast Result Date: 07/13/2023 CLINICAL DATA:  Acute aortic syndrome (AAS) suspected. History of multiple myeloma EXAM: CT ANGIOGRAPHY CHEST, ABDOMEN AND PELVIS TECHNIQUE:  Non-contrast CT of the chest was initially  obtained. Multidetector CT imaging through the chest, abdomen and pelvis was performed using the standard protocol during bolus administration of intravenous contrast. Multiplanar reconstructed images and MIPs were obtained and reviewed to evaluate the vascular anatomy. RADIATION DOSE REDUCTION: This exam was performed according to the departmental dose-optimization program which includes automated exposure control, adjustment of the mA and/or kV according to patient size and/or use of iterative reconstruction technique. CONTRAST:  75mL OMNIPAQUE IOHEXOL 350 MG/ML SOLN COMPARISON:  01/27/2023, 10/31/2020 FINDINGS: CTA CHEST FINDINGS Cardiovascular: Noncontrast CT of the chest demonstrates no evidence of a thoracic aortic intramural hematoma. Adequate opacification of the thoracic aorta. No thoracic aortic aneurysm or dissection. Three vessel arch with widely patent arch vessels. Aortic and coronary artery atherosclerosis. Central pulmonary vasculature is also well opacified. No central pulmonary arterial filling defects. Reflux of contrast into the IVC and hepatic veins. Heart size is normal. No significant pericardial effusion. Right IJ chest port terminates at the level of the mid SVC. Mediastinum/Nodes: Mildly enlarged subcarinal lymph nodes measuring up to 10 mm short axis (series 10, image 76). No axillary or hilar lymphadenopathy. Thyroid gland, trachea, and esophagus within normal limits. Lungs/Pleura: Mild emphysema. No focal airspace consolidation. No pleural effusion. No pneumothorax. Musculoskeletal: No chest wall abnormality. No acute or significant osseous findings. Review of the MIP images confirms the above findings. CTA ABDOMEN AND PELVIS FINDINGS VASCULAR Aorta: Normal caliber aorta without aneurysm, dissection, vasculitis or significant stenosis. Mild atherosclerosis. Celiac: Patent without evidence of aneurysm, dissection, vasculitis or significant  stenosis. SMA: Patent without evidence of aneurysm, dissection, vasculitis or significant stenosis. Renals: Both renal arteries are patent without evidence of aneurysm, dissection, vasculitis, fibromuscular dysplasia or significant stenosis. IMA: Patent. Inflow: Patent without evidence of aneurysm, dissection, vasculitis or significant stenosis. Veins: No obvious venous abnormality within the limitations of this arterial phase study. Review of the MIP images confirms the above findings. NON-VASCULAR Hepatobiliary: No focal liver abnormality is seen. No gallstones, gallbladder wall thickening, or biliary dilatation. Pancreas: Unremarkable. No pancreatic ductal dilatation or surrounding inflammatory changes. Spleen: Normal in size without focal abnormality. Adrenals/Urinary Tract: Adrenal glands are unremarkable. Kidneys are normal, without renal calculi, focal lesion, or hydronephrosis. Bladder is unremarkable. Stomach/Bowel: Stomach is within normal limits. Appendix appears normal. Colonic diverticulosis. No evidence of bowel wall thickening, distention, or inflammatory changes. Lymphatic: No abdominopelvic lymphadenopathy. Reproductive: Status post hysterectomy. No adnexal masses. Other: No ascites or pneumoperitoneum. Musculoskeletal: No significant change in appearance of scattered lucent lesions including dominant lesions in the posterior left acetabulum and L5 vertebral body. No new lesions are identified. No pathologic fractures. Review of the MIP images confirms the above findings. IMPRESSION: 1. No evidence of acute aortic syndrome. 2. Aortic and coronary artery atherosclerosis (ICD10-I70.0). 3. Reflux of contrast into the IVC and hepatic veins, which can be seen in the setting of right heart dysfunction. 4. Mildly enlarged subcarinal lymph nodes, nonspecific. 5. Colonic diverticulosis without evidence of acute diverticulitis. 6. No significant change in appearance of scattered lucent lesions including  dominant lesions in the posterior left acetabulum and L5 vertebral body. No new lesions are identified. No pathologic fractures. 7. Mild emphysema (ICD10-J43.9). Electronically Signed   By: Duanne Guess D.O.   On: 07/13/2023 17:19   CT HEAD WO CONTRAST ( ) Result Date: 07/13/2023 CLINICAL DATA:  Headache, increasing frequency or severity EXAM: CT HEAD WITHOUT CONTRAST TECHNIQUE: Contiguous axial images were obtained from the base of the skull through the vertex without intravenous contrast. RADIATION DOSE REDUCTION: This exam was performed  according to the departmental dose-optimization program which includes automated exposure control, adjustment of the mA and/or kV according to patient size and/or use of iterative reconstruction technique. COMPARISON:  CT head 03/17/2021. FINDINGS: Brain: No evidence of acute infarction, hemorrhage, hydrocephalus, extra-axial collection or mass lesion/mass effect. Partially empty sella. Vascular: No hyperdense vessel. Skull: Normal. Negative for fracture or focal lesion. Sinuses/Orbits: No acute finding. Other: No mastoid effusions. IMPRESSION: No evidence of acute intracranial abnormality. Electronically Signed   By: Feliberto Harts M.D.   On: 07/13/2023 16:44   DG Chest Port 1 View Result Date: 07/13/2023 CLINICAL DATA:  near syncope EXAM: PORTABLE CHEST - 1 VIEW COMPARISON:  03/16/2023. FINDINGS: Cardiac silhouette is unremarkable. No pneumothorax or pleural effusion. The lungs are clear. Aorta is calcified. There are thoracic degenerative changes. Right-sided Port-A-Cath tip overlies mid SVC. IMPRESSION: No acute cardiopulmonary process. Electronically Signed   By: Layla Maw M.D.   On: 07/13/2023 14:22    Pending Labs Unresulted Labs (From admission, onward)    None       Vitals/Pain Today's Vitals   07/13/23 1530 07/13/23 1715 07/13/23 1941 07/13/23 2006  BP: (!) 183/88 (!) 143/48    Pulse: 70 72    Resp: 20 20    Temp:   98.6 F (37  C)   TempSrc:   Oral   SpO2: 99% 99%    Weight:      Height:      PainSc:    5     Isolation Precautions No active isolations  Medications Medications  acyclovir (ZOVIRAX) tablet 400 mg (has no administration in time range)  atorvastatin (LIPITOR) tablet 10 mg (has no administration in time range)  traZODone (DESYREL) tablet 100 mg (has no administration in time range)  pantoprazole (PROTONIX) EC tablet 40 mg (has no administration in time range)  gabapentin (NEURONTIN) capsule 600 mg (has no administration in time range)  tiZANidine (ZANAFLEX) tablet 4 mg (has no administration in time range)  enoxaparin (LOVENOX) injection 40 mg (has no administration in time range)  acetaminophen (TYLENOL) tablet 650 mg (has no administration in time range)    Or  acetaminophen (TYLENOL) suppository 650 mg (has no administration in time range)  oxyCODONE (Oxy IR/ROXICODONE) immediate release tablet 5 mg (has no administration in time range)  ondansetron (ZOFRAN) tablet 4 mg (has no administration in time range)    Or  ondansetron (ZOFRAN) injection 4 mg (has no administration in time range)  methylPREDNISolone sodium succinate (SOLU-MEDROL) 40 mg/mL injection 40 mg (40 mg Intravenous Given 07/13/23 1455)  diphenhydrAMINE (BENADRYL) capsule 50 mg ( Oral See Alternative 07/13/23 1619)    Or  diphenhydrAMINE (BENADRYL) injection 50 mg (50 mg Intravenous Given 07/13/23 1619)  acetaminophen (TYLENOL) tablet 1,000 mg (1,000 mg Oral Given 07/13/23 1444)  prochlorperazine (COMPAZINE) injection 10 mg (10 mg Intravenous Given 07/13/23 1456)  iohexol (OMNIPAQUE) 350 MG/ML injection 75 mL (75 mLs Intravenous Contrast Given 07/13/23 1632)    Mobility walks with device     Focused Assessments Cardiac Assessment Handoff:    Lab Results  Component Value Date   CKTOTAL 218 (H) 08/29/2017   TROPONINI <0.03 09/06/2014   Lab Results  Component Value Date   DDIMER 0.49 07/13/2023   Does the  Patient currently have chest pain? Yes    R Recommendations: See Admitting Provider Note  Report given to:   Additional Notes: Left chest pain under breast. Troponin Elevated at 94 and 81. Dizziness as well with movement.

## 2023-07-13 NOTE — ED Provider Notes (Signed)
[place] EMERGENCY DEPARTMENT AT [facility] Provider Note   CSN: [id number] Arrival date & time: 07/13/23  1339     History  Chief Complaint  Patient presents with   Chest Pain    Tracy [name] is a 77 y.o. female.  HPI   77 year old female with medical history significant for HTN, MI/CAD, TIA, anxiety, breast cancer status post radiation and chemotherapy, multiple myeloma on immunotherapy presented to the emergency department with chest pain.  The patient states that she has had pain in her epigastrium and left side of her chest since this past Wednesday.  Pain has been intermittent, intermittently sharp and intermittently a pressure squeezing sensation, sometimes worse with movement.  The pain radiates to her back.  It was severe earlier today which prompted her presentation to the emergency department but today right at this moment she states that the pain is 1 out of 10.  She felt lightheaded and fell backwards on the couch but did not hit her head or experience loss of consciousness.  She is not anticoagulated.  She does endorse a headache across her temples, moderate in severity.  Home Medications Prior to Admission medications   Medication Sig Start Date End Date Taking? Authorizing Provider  acyclovir (ZOVIRAX) 400 MG tablet TAKE 1 TABLET BY MOUTH TWICE  DAILY 12/02/22   Johney Maine, MD  atorvastatin (LIPITOR) 10 MG tablet TAKE 1 TABLET BY MOUTH DAILY 01/27/23   [name], MD  azelastine (ASTELIN) 0.1 % nasal spray Place 1 spray into both nostrils 2 (two) times daily. Use in each nostril as directed 05/29/21   Janeece Agee, NP  gabapentin (NEURONTIN) 300 MG capsule Take 2 capsules (600 mg total) by mouth 3 (three) times daily. 01/31/23 04/25/24  [name], MD  glucose blood (ACCU-CHEK GUIDE) test strip Use as instructed to check sugars 1-2 times daily. 02/10/20   [name], MD  HYDROcodone-acetaminophen (NORCO/VICODIN) 5-325 MG  tablet Take 1 tablet by mouth every 6 (six) hours as needed. 03/16/23   Wynetta Fines, MD  levocetirizine (XYZAL) 5 MG tablet Take 1 tablet (5 mg total) by mouth every evening. 07/09/23   [name], MD  lidocaine (LIDODERM) 5 % Place 1 patch onto the skin daily. Remove & Discard patch within 12 hours or as directed by MD 07/03/23   Johney Maine, MD  lidocaine-prilocaine (EMLA) cream Apply 1 Application topically as needed (Apply 1 application to port site 1 hour prior to access as needed). 07/10/23   Johney Maine, MD  meclizine (ANTIVERT) 25 MG tablet Take 1 tablet (25 mg total) by mouth 3 (three) times daily as needed for dizziness. 03/08/21   Loa Socks, NP  metFORMIN (GLUCOPHAGE) 500 MG tablet TAKE 1 TABLET BY MOUTH  TWICE DAILY WITH A MEAL 02/13/23   [name], MD  ondansetron (ZOFRAN) 8 MG tablet Take 1 tablet (8 mg total) by mouth every 8 (eight) hours as needed for nausea or vomiting. 01/31/23   Briant Cedar, PA-C  pantoprazole (PROTONIX) 40 MG tablet TAKE 1 TABLET BY MOUTH TWICE  DAILY 07/07/23   [name], MD  tiZANidine (ZANAFLEX) 4 MG tablet TAKE 1 TABLET BY MOUTH AT  BEDTIME 04/10/22   [name], MD  traZODone (DESYREL) 100 MG tablet TAKE 1 TABLET BY MOUTH AT  BEDTIME 04/01/23   [name], MD  Vitamin D, Ergocalciferol, (DRISDOL) 1.25 MG (50000 UNIT) CAPS capsule TAKE 1  CAPSULE BY MOUTH ONCE  WEEKLY 11/06/22   Johney Maine, MD      Allergies    Bacitracin-neomycin-polymyxin  [neomycin-bacitracin zn-polymyx], Nsaids, Tape, Ambien [zolpidem tartrate], Amoxicillin, Clavulanic acid, Contrast media [iodinated contrast media], Latex, Prednisone, and Tessalon [benzonatate]    Review of Systems   Review of Systems  All other systems reviewed and are negative.   Physical Exam Updated Vital Signs BP (!) 143/48 (BP Location: Right Arm)   Pulse 72   Resp 20   Ht 5' (1.524 m)   Wt 81 kg   SpO2 99%   BMI  34.88 kg/m  Physical Exam Vitals and nursing note reviewed.  Constitutional:      General: She is not in acute distress.    Appearance: She is well-developed.  HENT:     Head: Normocephalic and atraumatic.  Eyes:     Conjunctiva/sclera: Conjunctivae normal.  Cardiovascular:     Rate and Rhythm: Normal rate and regular rhythm.  Pulmonary:     Effort: Pulmonary effort is normal. No respiratory distress.     Breath sounds: Normal breath sounds.  Chest:     Comments: Slight left sided chest wall TTP Abdominal:     Palpations: Abdomen is soft.     Tenderness: There is abdominal tenderness in the epigastric area.  Musculoskeletal:        General: No swelling.     Cervical back: Neck supple.  Skin:    General: Skin is warm and dry.     Capillary Refill: Capillary refill takes less than 2 seconds.  Neurological:     Mental Status: She is alert.     GCS: GCS eye subscore is 4. GCS verbal subscore is 5. GCS motor subscore is 6.     Cranial Nerves: Cranial nerves 2-12 are intact.     Sensory: Sensation is intact.     Motor: Motor function is intact.  Psychiatric:        Mood and Affect: Mood normal.     ED Results / Procedures / Treatments   Labs (all labs ordered are listed, but only abnormal results are displayed) Labs Reviewed  CBC - Abnormal; Notable for the following components:      Result Value   RBC 3.14 (*)    Hemoglobin 9.9 (*)    HCT 30.1 (*)    All other components within normal limits  COMPREHENSIVE METABOLIC PANEL - Abnormal; Notable for the following components:   Glucose, Bld 112 (*)    Creatinine, Ser 1.35 (*)    Total Protein 6.2 (*)    Albumin 3.2 (*)    GFR, Estimated 40 (*)    All other components within normal limits  TROPONIN I (HIGH SENSITIVITY) - Abnormal; Notable for the following components:   Troponin I (High Sensitivity) 94 (*)    All other components within normal limits  TROPONIN I (HIGH SENSITIVITY) - Abnormal; Notable for the following  components:   Troponin I (High Sensitivity) 81 (*)    All other components within normal limits  RESP PANEL BY RT-PCR (RSV, FLU A&B, COVID)  RVPGX2  D-DIMER, QUANTITATIVE  LIPASE, BLOOD    EKG EKG Interpretation Date/Time:  Sunday July 13 2023 13:49:08 EST Ventricular Rate:  73 PR Interval:  124 QRS Duration:  82 QT Interval:  397 QTC Calculation: 438 R Axis:   -12  Text Interpretation: Sinus rhythm Atrial premature complex Confirmed by Ernie Avena ([id number]) on 07/13/2023 4:23:26 PM  Radiology CT Angio Chest/Abd/Pel  for Dissection W and/or Wo Contrast Result Date: 07/13/2023 CLINICAL DATA:  Acute aortic syndrome (AAS) suspected. History of multiple myeloma EXAM: CT ANGIOGRAPHY CHEST, ABDOMEN AND PELVIS TECHNIQUE: Non-contrast CT of the chest was initially obtained. Multidetector CT imaging through the chest, abdomen and pelvis was performed using the standard protocol during bolus administration of intravenous contrast. Multiplanar reconstructed images and MIPs were obtained and reviewed to evaluate the vascular anatomy. RADIATION DOSE REDUCTION: This exam was performed according to the departmental dose-optimization program which includes automated exposure control, adjustment of the mA and/or kV according to patient size and/or use of iterative reconstruction technique. CONTRAST:  75mL OMNIPAQUE IOHEXOL 350 MG/ML SOLN COMPARISON:  01/27/2023, 10/31/2020 FINDINGS: CTA CHEST FINDINGS Cardiovascular: Noncontrast CT of the chest demonstrates no evidence of a thoracic aortic intramural hematoma. Adequate opacification of the thoracic aorta. No thoracic aortic aneurysm or dissection. Three vessel arch with widely patent arch vessels. Aortic and coronary artery atherosclerosis. Central pulmonary vasculature is also well opacified. No central pulmonary arterial filling defects. Reflux of contrast into the IVC and hepatic veins. Heart size is normal. No significant pericardial effusion. Right IJ  chest port terminates at the level of the mid SVC. Mediastinum/Nodes: Mildly enlarged subcarinal lymph nodes measuring up to 10 mm short axis (series 10, image 76). No axillary or hilar lymphadenopathy. Thyroid gland, trachea, and esophagus within normal limits. Lungs/Pleura: Mild emphysema. No focal airspace consolidation. No pleural effusion. No pneumothorax. Musculoskeletal: No chest wall abnormality. No acute or significant osseous findings. Review of the MIP images confirms the above findings. CTA ABDOMEN AND PELVIS FINDINGS VASCULAR Aorta: Normal caliber aorta without aneurysm, dissection, vasculitis or significant stenosis. Mild atherosclerosis. Celiac: Patent without evidence of aneurysm, dissection, vasculitis or significant stenosis. SMA: Patent without evidence of aneurysm, dissection, vasculitis or significant stenosis. Renals: Both renal arteries are patent without evidence of aneurysm, dissection, vasculitis, fibromuscular dysplasia or significant stenosis. IMA: Patent. Inflow: Patent without evidence of aneurysm, dissection, vasculitis or significant stenosis. Veins: No obvious venous abnormality within the limitations of this arterial phase study. Review of the MIP images confirms the above findings. NON-VASCULAR Hepatobiliary: No focal liver abnormality is seen. No gallstones, gallbladder wall thickening, or biliary dilatation. Pancreas: Unremarkable. No pancreatic ductal dilatation or surrounding inflammatory changes. Spleen: Normal in size without focal abnormality. Adrenals/Urinary Tract: Adrenal glands are unremarkable. Kidneys are normal, without renal calculi, focal lesion, or hydronephrosis. Bladder is unremarkable. Stomach/Bowel: Stomach is within normal limits. Appendix appears normal. Colonic diverticulosis. No evidence of bowel wall thickening, distention, or inflammatory changes. Lymphatic: No abdominopelvic lymphadenopathy. Reproductive: Status post hysterectomy. No adnexal masses.  Other: No ascites or pneumoperitoneum. Musculoskeletal: No significant change in appearance of scattered lucent lesions including dominant lesions in the posterior left acetabulum and L5 vertebral body. No new lesions are identified. No pathologic fractures. Review of the MIP images confirms the above findings. IMPRESSION: 1. No evidence of acute aortic syndrome. 2. Aortic and coronary artery atherosclerosis (ICD10-I70.0). 3. Reflux of contrast into the IVC and hepatic veins, which can be seen in the setting of right heart dysfunction. 4. Mildly enlarged subcarinal lymph nodes, nonspecific. 5. Colonic diverticulosis without evidence of acute diverticulitis. 6. No significant change in appearance of scattered lucent lesions including dominant lesions in the posterior left acetabulum and L5 vertebral body. No new lesions are identified. No pathologic fractures. 7. Mild emphysema (ICD10-J43.9). Electronically Signed   By: Duanne Guess D.O.   On: 07/13/2023 17:19   CT HEAD WO CONTRAST ( ) Result Date: 07/13/2023 CLINICAL DATA:  Headache, increasing frequency or severity EXAM: CT HEAD WITHOUT CONTRAST TECHNIQUE: Contiguous axial images were obtained from the base of the skull through the vertex without intravenous contrast. RADIATION DOSE REDUCTION: This exam was performed according to the departmental dose-optimization program which includes automated exposure control, adjustment of the mA and/or kV according to patient size and/or use of iterative reconstruction technique. COMPARISON:  CT head 03/17/2021. FINDINGS: Brain: No evidence of acute infarction, hemorrhage, hydrocephalus, extra-axial collection or mass lesion/mass effect. Partially empty sella. Vascular: No hyperdense vessel. Skull: Normal. Negative for fracture or focal lesion. Sinuses/Orbits: No acute finding. Other: No mastoid effusions. IMPRESSION: No evidence of acute intracranial abnormality. Electronically Signed   By: Feliberto Harts M.D.    On: 07/13/2023 16:44   DG Chest Port 1 View Result Date: 07/13/2023 CLINICAL DATA:  near syncope EXAM: PORTABLE CHEST - 1 VIEW COMPARISON:  03/16/2023. FINDINGS: Cardiac silhouette is unremarkable. No pneumothorax or pleural effusion. The lungs are clear. Aorta is calcified. There are thoracic degenerative changes. Right-sided Port-A-Cath tip overlies mid SVC. IMPRESSION: No acute cardiopulmonary process. Electronically Signed   By: Layla Maw M.D.   On: 07/13/2023 14:22    Procedures Procedures    Medications Ordered in ED Medications  methylPREDNISolone sodium succinate (SOLU-MEDROL) 40 mg/mL injection 40 mg (40 mg Intravenous Given 07/13/23 1455)  diphenhydrAMINE (BENADRYL) capsule 50 mg ( Oral See Alternative 07/13/23 1619)    Or  diphenhydrAMINE (BENADRYL) injection 50 mg (50 mg Intravenous Given 07/13/23 1619)  acetaminophen (TYLENOL) tablet 1,000 mg (1,000 mg Oral Given 07/13/23 1444)  prochlorperazine (COMPAZINE) injection 10 mg (10 mg Intravenous Given 07/13/23 1456)  iohexol (OMNIPAQUE) 350 MG/ML injection 75 mL (75 mLs Intravenous Contrast Given 07/13/23 1632)    ED Course/ Medical Decision Making/ A&P                                 Medical Decision Making Amount and/or Complexity of Data Reviewed Labs: ordered. Radiology: ordered.  Risk OTC drugs. Prescription drug management. Decision regarding hospitalization.    77 year old female with medical history significant for HTN, MI/CAD, TIA, anxiety, breast cancer status post radiation and chemotherapy, multiple myeloma on immunotherapy presented to the emergency department with chest pain.  The patient states that she has had pain in her epigastrium and left side of her chest since this past Wednesday.  Pain has been intermittent, intermittently sharp and intermittently a pressure squeezing sensation, sometimes worse with movement.  The pain radiates to her back.  It was severe earlier today which prompted her  presentation to the emergency department but today right at this moment she states that the pain is 1 out of 10.  She felt lightheaded and fell backwards on the couch but did not hit her head or experience loss of consciousness.  She is not anticoagulated.  She does endorse a headache across her temples, moderate in severity.  On arrival, the patient was not tachycardic or tachypneic, hypertensive BP 190/74, saturating 100% on room air.  Blood pressure improved to 143/48 without intervention.  Physical exam revealed a normal neurologic exam, slight left-sided chest wall tenderness to palpation, some abdominal tenderness in the epigastrium.  Differential diagnosis includes hypertensive emergency, aortic dissection, gastritis, GERD, musculoskeletal chest pain, pancreatitis, less likely other acute intrathoracic or intra-abdominal emergency.  Initial EKG revealed sinus rhythm, ventricular rate 73, no acute ischemic changes.  A chest x-ray was performed and showed no acute cardiopulmonary process.  CT head was obtained as the patient was complaining of a headache.  No acute intracranial abnormality was identified.  The patient's headache was treated with Tylenol and IV Compazine with subsequent improvement.  CTA dissection study obtained given the patient's chest pain radiating to her back.  She had to be premedicated due to a contrast allergy.  CTA Dissection: IMPRESSION:  1. No evidence of acute aortic syndrome.  2. Aortic and coronary artery atherosclerosis (ICD10-I70.0).  3. Reflux of contrast into the IVC and hepatic veins, which can be  seen in the setting of right heart dysfunction.  4. Mildly enlarged subcarinal lymph nodes, nonspecific.  5. Colonic diverticulosis without evidence of acute diverticulitis.  6. No significant change in appearance of scattered lucent lesions  including dominant lesions in the posterior left acetabulum and L5  vertebral body. No new lesions are identified. No  pathologic  fractures.  7. Mild emphysema (ICD10-J43.9).   Laboratory evaluation revealed D-dimer negative, COVID-19 influenza PCR testing normal, CMP without significant electrolyte abnormality, renal function with a serum creatinine of 1.35, no significant LFT elevation, lipase normal, CBC without a leukocytosis,  mild anemia to 9.9 which is at the patient's baseline.  Cardiac troponins elevated but flat at 94 and 81.  Patient chest pain had resolved on my evaluation.  In his setting of her near syncopal episode, chest discomfort with elevated troponin, recommended admission for observation, medicine consulted for admission, Dr. Avie Arenas accepting.    Final Clinical Impression(s) / ED Diagnoses Final diagnoses:  Near syncope  Chest pain, unspecified type  Elevated troponin    Rx / DC Orders ED Discharge Orders     None         Ernie Avena, MD 07/13/23 1921

## 2023-07-13 NOTE — ED Triage Notes (Signed)
Pt to the ed from home via ems. Pt relay she started getting chest pain while sitting on couch. Pt stood up from sitting and felt dizzy and fell back on couch. Pt did not hit head, or have loc, no blood thinners. Pt is having a head ache.

## 2023-07-14 ENCOUNTER — Observation Stay (HOSPITAL_BASED_OUTPATIENT_CLINIC_OR_DEPARTMENT_OTHER): Payer: Medicare Other

## 2023-07-14 ENCOUNTER — Observation Stay (HOSPITAL_COMMUNITY): Payer: Medicare Other

## 2023-07-14 DIAGNOSIS — R55 Syncope and collapse: Secondary | ICD-10-CM

## 2023-07-14 DIAGNOSIS — R29818 Other symptoms and signs involving the nervous system: Secondary | ICD-10-CM | POA: Diagnosis not present

## 2023-07-14 LAB — ECHOCARDIOGRAM COMPLETE
AR max vel: 2.1 cm2
AV Area VTI: 2.06 cm2
AV Area mean vel: 2.17 cm2
AV Mean grad: 8.8 mm[Hg]
AV Peak grad: 19.6 mm[Hg]
Ao pk vel: 2.22 m/s
Area-P 1/2: 3.27 cm2
Calc EF: 78 %
Height: 60 in
S' Lateral: 1.9 cm
Single Plane A2C EF: 75 %
Single Plane A4C EF: 80.6 %
Weight: 2857.16 [oz_av]

## 2023-07-14 NOTE — TOC Transition Note (Signed)
Transition of Care [facility]) - Discharge Note   Patient Details  Name: TRIVIA PASSALAQUA MRN: [id number] Date of Birth: 01/16/1946  [facility] of [facility]) CM/SW Contact:  Tom-Johnson, Hershal Coria, RN Phone Number: 07/14/2023, 12:46 PM   Clinical Narrative:     Patient is scheduled for discharge today.  Readmission Risk Assessment done. Outpatient f/u, hospital f/u and discharge instructions on AVS. No TOC needs or recommendations noted. [facility] to transport at discharge.  No further TOC needs noted.        Final next level of care: Home/Self Care Barriers to Discharge: Barriers Resolved   Patient Goals and CMS Choice Patient states their goals for this hospitalization and ongoing recovery are:: To return home CMS Medicare.gov Compare Post Acute Care list provided to:: Patient Choice offered to / list presented to : NA      Discharge Placement                Patient to be transferred to facility by: Elesa Hacker Friend Name of family member notified: Katherina Right    Discharge Plan and Services Additional resources added to the After Visit Summary for                  DME Arranged: N/A DME Agency: NA       HH Arranged: NA HH Agency: NA        Social Drivers of Health (SDOH) Interventions SDOH Screenings   Food Insecurity: No Food Insecurity (07/14/2023)  Housing: Low Risk  (07/14/2023)  Transportation Needs: Unmet Transportation Needs (07/14/2023)  Utilities: At Risk (07/14/2023)  Alcohol Screen: Low Risk  (12/20/2021)  Depression (PHQ2-9): Medium Risk (04/30/2023)  Financial Resource Strain: High Risk (12/26/2022)  Physical Activity: Inactive (12/26/2022)  Social Connections: Moderately Isolated (12/26/2022)  Stress: Stress Concern Present (12/26/2022)  Tobacco Use: Medium Risk (05/30/2023)     Readmission Risk Interventions     No data to display

## 2023-07-14 NOTE — Plan of Care (Signed)

## 2023-07-14 NOTE — TOC Progression Note (Signed)
Transition of Care [facility]) - Progression Note    Patient Details  Name: Tracy [name] MRN: [id number] Date of Birth: 09/22/1945  [facility] CM/SW Contact  Erin Sons, [place] Phone Number: 07/14/2023, 3:50 PM  Clinical Narrative:     CSW met with pt to discuss SNF rec. Pt states she definitely doesn't wanna go to SNF. She lives alone and said she has a friend who is a CNA who will stop in to check on her. She has no DME. She would be agreeable to [facility] and [facility]. Her friend is her only support local. She has a Information systems manager and daughter who are both out of state. She has no preference in [facility] agency. She states her friend would be transporting her home on DC. RNCM notified.   Expected Discharge Plan: Home w Home Health Services Barriers to Discharge: Continued Medical Work up  Expected Discharge Plan and Services         Expected Discharge Date: 07/14/23               DME Arranged: N/A DME Agency: NA       HH Arranged: NA HH Agency: NA         Social Determinants of Health (SDOH) Interventions SDOH Screenings   Food Insecurity: No Food Insecurity (07/14/2023)  Housing: Low Risk  (07/14/2023)  Transportation Needs: Unmet Transportation Needs (07/14/2023)  Utilities: At Risk (07/14/2023)  Alcohol Screen: Low Risk  (12/20/2021)  Depression (PHQ2-9): Medium Risk (04/30/2023)  Financial Resource Strain: High Risk (12/26/2022)  Physical Activity: Inactive (12/26/2022)  Social Connections: Moderately Isolated (12/26/2022)  Stress: Stress Concern Present (12/26/2022)  Tobacco Use: Medium Risk (05/30/2023)    Readmission Risk Interventions     No data to display

## 2023-07-14 NOTE — Progress Notes (Signed)
PROGRESS NOTE  Tracy [name]:[id number] DOB: 07-26-1946 DOA: 07/13/2023 PCP: [name], MD   LOS: 0 days   Brief Narrative / Interim history: 77 y.o. female with medical history significant of breast cancer, hypertension, hyperlipidemia, CAD, multiple myeloma who presents emergency department after syncopal episode. She has also been reporting lower chest discomfort, upper epigastric area, positional and worse with movement, and generally goes away when rubbing her chest.  She reports several episodes of "feeling weak inside her body" and needing to keep her eyes closed otherwise she would fall if the eyes are open.  Subjective / 24h Interval events: Still complains of persistent chest pain, unremitting for several days.  Reports pretty significant weakness "inside her body"  Assesement and Plan: Principal Problem:   Syncope Active Problems:   Syncope and collapse   Principal problem Presyncopal episode, chest pain-patient was admitted to the hospital with several days of constant epigastric/lower chest discomfort as well as presyncopal episode.  She is not sure whether she lost consciousness or not, but felt very lightheaded before this happened.  She was found to have pretty elevated blood pressure so I wonder whether she was hypotensive initially. Underwent a 2D echo which showed normal LVEF 65 to 70%, no WMA, grade 1 diastolic dysfunction.  RV was normal.   -No events on telemetry   Active problems Dizziness -when working with PT today she was extremely limited functionally due to new onset of weakness and dizziness, she did not mention this to me initially but apparently has been going on for the past week. -With persistent symptoms not safe to go home, hold discharge, obtain MRI of the brain  Chest pain-sounds MSK in nature, it is changing with changing position as well as reproducible to palpation in the lower chest area.  She had mild troponin elevation, flat, not  in a pattern consistent with ACS  Hyperlipidemia-resume home medications  History of breast cancer, MM-follows with oncology as an outpatient  CKD 3A-baseline creatinine 1.2-1.4, currently at baseline    Scheduled Meds:  acyclovir  400 mg Oral BID   atorvastatin  10 mg Oral Daily   enoxaparin (LOVENOX) injection  40 mg Subcutaneous Q24H   gabapentin  600 mg Oral TID   pantoprazole  40 mg Oral BID   tiZANidine  4 mg Oral QHS   traZODone  100 mg Oral QHS   Continuous Infusions: PRN Meds:.acetaminophen **OR** acetaminophen, ondansetron **OR** ondansetron (ZOFRAN) IV, oxyCODONE  Current Outpatient Medications  Medication Instructions   acyclovir (ZOVIRAX) 400 mg, Oral, 2 times daily   atorvastatin (LIPITOR) 10 MG tablet TAKE 1 TABLET BY MOUTH DAILY   gabapentin (NEURONTIN) 600 mg, Oral, 3 times daily   glucose blood (ACCU-CHEK GUIDE) test strip Use as instructed to check sugars 1-2 times daily.   lidocaine (LIDODERM) 5 % 1 patch, Transdermal, Every 24 hours, Remove & Discard patch within 12 hours or as directed by MD   lidocaine-prilocaine (EMLA) cream 1 Application, Topical, As needed   meclizine (ANTIVERT) 25 mg, Oral, 3 times daily PRN   metFORMIN (GLUCOPHAGE) 500 MG tablet TAKE 1 TABLET BY MOUTH  TWICE DAILY WITH A MEAL   ondansetron (ZOFRAN) 8 mg, Oral, Every 8 hours PRN   pantoprazole (PROTONIX) 40 MG tablet TAKE 1 TABLET BY MOUTH TWICE  DAILY   tiZANidine (ZANAFLEX) 4 MG tablet TAKE 1 TABLET BY MOUTH AT  BEDTIME   traZODone (DESYREL) 100 MG tablet TAKE 1 TABLET BY MOUTH AT  BEDTIME  Vitamin D (Ergocalciferol) (DRISDOL) 50,000 Units, Oral, Weekly    Diet Orders (From admission, onward)     Start     Ordered   07/13/23 2103  Diet regular Room service appropriate? Yes; Fluid consistency: Thin  Diet effective now       Question Answer Comment  Room service appropriate? Yes   Fluid consistency: Thin      07/13/23 2102            DVT prophylaxis: enoxaparin  (LOVENOX) injection 40 mg Start: 07/13/23 2200 SCDs Start: 07/13/23 2102   Lab Results  Component Value Date   PLT 326 07/13/2023      Code Status: Full Code  Family Communication: no family at bedside   Status is: Observation The patient will require care spanning > 2 midnights and should be moved to inpatient because: persistent symptoms  Level of care: Telemetry Medical  Consultants:  none  Objective: Vitals:   07/13/23 1941 07/13/23 2320 07/14/23 0353 07/14/23 0917  BP:  (!) 117/48 (!) 99/35 (!) 113/39  Pulse:  76 62 67  Resp:  17 20 18   Temp: 98.6 F (37 C) 97.6 F (36.4 C) 98.2 F (36.8 C) 98.5 F (36.9 C)  TempSrc: Oral Oral    SpO2:  98% 97% 97%  Weight:      Height:        Intake/Output Summary (Last 24 hours) at 07/14/2023 1454 Last data filed at 07/14/2023 0200 Gross per 24 hour  Intake 0 ml  Output --  Net 0 ml   Wt Readings from Last 3 Encounters:  07/13/23 81 kg  05/30/23 81.6 kg  05/23/23 81.9 kg    Examination:  Constitutional: NAD Eyes: no scleral icterus ENMT: Mucous membranes are moist.  Neck: normal, supple Respiratory: clear to auscultation bilaterally, no wheezing, no crackles. Normal respiratory effort. Cardiovascular: Regular rate and rhythm, no murmurs / rubs / gallops. No LE edema.  Abdomen: non distended, no tenderness. Bowel sounds positive.  Musculoskeletal: no clubbing / cyanosis.   Data Reviewed: I have independently reviewed following labs and imaging studies   CBC Recent Labs  Lab 07/13/23 1511  WBC 7.5  HGB 9.9*  HCT 30.1*  PLT 326  MCV 95.9  MCH 31.5  MCHC 32.9  RDW 13.2    Recent Labs  Lab 07/13/23 1511  NA 137  K 3.9  CL 104  CO2 22  GLUCOSE 112*  BUN 9  CREATININE 1.35*  CALCIUM 9.3  AST 16  ALT 12  ALKPHOS 70  BILITOT 0.5  ALBUMIN 3.2*  DDIMER 0.49    ------------------------------------------------------------------------------------------------------------------ No results for  input(s): "CHOL", "HDL", "LDLCALC", "TRIG", "CHOLHDL", "LDLDIRECT" in the last 72 hours.  Lab Results  Component Value Date   HGBA1C 6.7 (H) 01/08/2023   ------------------------------------------------------------------------------------------------------------------ No results for input(s): "TSH", "T4TOTAL", "T3FREE", "THYROIDAB" in the last 72 hours.  Invalid input(s): "FREET3"  Cardiac Enzymes No results for input(s): "CKMB", "TROPONINI", "MYOGLOBIN" in the last 168 hours.  Invalid input(s): "CK" ------------------------------------------------------------------------------------------------------------------    Component Value Date/Time   BNP 34.0 03/16/2021 2335    CBG: No results for input(s): "GLUCAP" in the last 168 hours.  Recent Results (from the past 240 hours)  Resp panel by RT-PCR (RSV, Flu A&B, Covid) Anterior Nasal Swab     Status: None   Collection Time: 07/13/23  2:33 PM   Specimen: Anterior Nasal Swab  Result Value Ref Range Status   SARS Coronavirus 2 by RT PCR NEGATIVE NEGATIVE Final  Influenza A by PCR NEGATIVE NEGATIVE Final   Influenza B by PCR NEGATIVE NEGATIVE Final    Comment: (NOTE) The Xpert Xpress SARS-CoV-2/FLU/RSV plus assay is intended as an aid in the diagnosis of influenza from Nasopharyngeal swab specimens and should not be used as a sole basis for treatment. Nasal washings and aspirates are unacceptable for Xpert Xpress SARS-CoV-2/FLU/RSV testing.  Fact Sheet for Patients: BloggerCourse.com  Fact Sheet for Healthcare Providers: SeriousBroker.it  This test is not yet approved or cleared by the [place] FDA and has been authorized for detection and/or diagnosis of SARS-CoV-2 by FDA under an Emergency Use Authorization (EUA). This EUA will remain in effect (meaning this test can be used) for the duration of the COVID-19 declaration under Section 564(b)(1) of the Act, 21  U.S.C. section 360bbb-3(b)(1), unless the authorization is terminated or revoked.     Resp Syncytial Virus by PCR NEGATIVE NEGATIVE Final    Comment: (NOTE) Fact Sheet for Patients: BloggerCourse.com  Fact Sheet for Healthcare Providers: SeriousBroker.it  This test is not yet approved or cleared by the [place] FDA and has been authorized for detection and/or diagnosis of SARS-CoV-2 by FDA under an Emergency Use Authorization (EUA). This EUA will remain in effect (meaning this test can be used) for the duration of the COVID-19 declaration under Section 564(b)(1) of the Act, 21 U.S.C. section 360bbb-3(b)(1), unless the authorization is terminated or revoked.  Performed at [facility], [address], [place], [place] [address]      Radiology Studies: ECHOCARDIOGRAM COMPLETE Result Date: 07/14/2023    ECHOCARDIOGRAM REPORT   Patient Name:   Tracy [name] Date of Exam: 07/14/2023 Medical Rec #:  [id number]      Height:       60.0 in Accession #:    [id number]     Weight:       178.6 lb Date of Birth:  03/13/1946      BSA:          1.779 m Patient Age:    87 years       BP:           113/39 mmHg Patient Gender: F              HR:           72 bpm. Exam Location:  Inpatient Procedure: 2D Echo, Cardiac Doppler and Color Doppler Indications:    syncope  History:        Patient has no prior history of Echocardiogram examinations.                 CAD, TIA; Risk Factors:Hypertension and Diabetes.  Sonographer:    Webb Laws Referring Phys: [id number] ROBERT DORRELL IMPRESSIONS  1. Left ventricular ejection fraction, by estimation, is 65 to 70%. The left ventricle has normal function. The left ventricle has no regional wall motion abnormalities. There is mild concentric left ventricular hypertrophy. Left ventricular diastolic parameters are consistent with Grade I diastolic dysfunction (impaired relaxation). The average left  ventricular global longitudinal strain is -19.4 %. The global longitudinal strain is normal.  2. Right ventricular systolic function is normal. The right ventricular size is normal.  3. Left atrial size was mildly dilated.  4. The mitral valve is normal in structure. Trivial mitral valve regurgitation. No evidence of mitral stenosis. Moderate mitral annular calcification.  5. The aortic valve is normal in structure. Aortic valve regurgitation is not visualized. Aortic valve sclerosis/calcification is present, without any  evidence of aortic stenosis.  6. The inferior vena cava is normal in size with greater than 50% respiratory variability, suggesting right atrial pressure of 3 mmHg. FINDINGS  Left Ventricle: Left ventricular ejection fraction, by estimation, is 65 to 70%. The left ventricle has normal function. The left ventricle has no regional wall motion abnormalities. The average left ventricular global longitudinal strain is -19.4 %. The global longitudinal strain is normal. The left ventricular internal cavity size was normal in size. There is mild concentric left ventricular hypertrophy. Left ventricular diastolic parameters are consistent with Grade I diastolic dysfunction (impaired relaxation). Right Ventricle: The right ventricular size is normal. No increase in right ventricular wall thickness. Right ventricular systolic function is normal. Left Atrium: Left atrial size was mildly dilated. Right Atrium: Right atrial size was normal in size. Pericardium: There is no evidence of pericardial effusion. Mitral Valve: The mitral valve is normal in structure. Moderate mitral annular calcification. Trivial mitral valve regurgitation. No evidence of mitral valve stenosis. Tricuspid Valve: The tricuspid valve is normal in structure. Tricuspid valve regurgitation is trivial. No evidence of tricuspid stenosis. Aortic Valve: The aortic valve is normal in structure. Aortic valve regurgitation is not visualized. Aortic  valve sclerosis/calcification is present, without any evidence of aortic stenosis. Aortic valve mean gradient measures 8.8 mmHg. Aortic valve peak  gradient measures 19.6 mmHg. Aortic valve area, by VTI measures 2.06 cm. Pulmonic Valve: The pulmonic valve was normal in structure. Pulmonic valve regurgitation is trivial. No evidence of pulmonic stenosis. Aorta: The aortic root is normal in size and structure. Venous: The inferior vena cava is normal in size with greater than 50% respiratory variability, suggesting right atrial pressure of 3 mmHg. IAS/Shunts: No atrial level shunt detected by color flow Doppler.  LEFT VENTRICLE PLAX 2D LVIDd:         3.90 cm     Diastology LVIDs:         1.90 cm     LV e' medial:    8.27 cm/s LV PW:         1.10 cm     LV E/e' medial:  13.1 LV IVS:        1.00 cm     LV e' lateral:   8.38 cm/s LVOT diam:     1.90 cm     LV E/e' lateral: 12.9 LV SV:         75 LV SV Index:   42          2D Longitudinal Strain LVOT Area:     2.84 cm    2D Strain GLS Avg:     -19.4 %  LV Volumes (MOD) LV vol d, MOD A2C: 54.5 ml 3D Volume EF: LV vol d, MOD A4C: 61.2 ml 3D EF:        59 % LV vol s, MOD A2C: 13.6 ml LV EDV:       88 ml LV vol s, MOD A4C: 11.9 ml LV ESV:       35 ml LV SV MOD A2C:     40.9 ml LV SV:        52 ml LV SV MOD A4C:     61.2 ml LV SV MOD BP:      46.3 ml RIGHT VENTRICLE             IVC RV Basal diam:  2.60 cm     IVC diam: 1.60 cm RV S prime:     18.50 cm/s  TAPSE (M-mode): 2.5 cm LEFT ATRIUM             Index        RIGHT ATRIUM           Index LA diam:        3.60 cm 2.02 cm/m   RA Area:     11.30 cm LA Vol (A2C):   27.3 ml 15.35 ml/m  RA Volume:   21.70 ml  12.20 ml/m LA Vol (A4C):   33.5 ml 18.83 ml/m LA Biplane Vol: 31.3 ml 17.60 ml/m  AORTIC VALVE AV Area (Vmax):    2.10 cm AV Area (Vmean):   2.17 cm AV Area (VTI):     2.06 cm AV Vmax:           221.60 cm/s AV Vmean:          136.000 cm/s AV VTI:            0.362 m AV Peak Grad:      19.6 mmHg AV Mean Grad:       8.8 mmHg LVOT Vmax:         164.00 cm/s LVOT Vmean:        104.000 cm/s LVOT VTI:          0.263 m LVOT/AV VTI ratio: 0.73  AORTA Ao Root diam: 2.50 cm Ao Asc diam:  2.90 cm MITRAL VALVE MV Area (PHT): 3.27 cm     SHUNTS MV Decel Time: 232 msec     Systemic VTI:  0.26 m MV E velocity: 108.00 cm/s  Systemic Diam: 1.90 cm MV A velocity: 177.00 cm/s MV E/A ratio:  0.61 Arvilla Meres MD Electronically signed by Arvilla Meres MD Signature Date/Time: 07/14/2023/11:08:02 AM    Final    CT Angio Chest/Abd/Pel for Dissection W and/or Wo Contrast Result Date: 07/13/2023 CLINICAL DATA:  Acute aortic syndrome (AAS) suspected. History of multiple myeloma EXAM: CT ANGIOGRAPHY CHEST, ABDOMEN AND PELVIS TECHNIQUE: Non-contrast CT of the chest was initially obtained. Multidetector CT imaging through the chest, abdomen and pelvis was performed using the standard protocol during bolus administration of intravenous contrast. Multiplanar reconstructed images and MIPs were obtained and reviewed to evaluate the vascular anatomy. RADIATION DOSE REDUCTION: This exam was performed according to the departmental dose-optimization program which includes automated exposure control, adjustment of the mA and/or kV according to patient size and/or use of iterative reconstruction technique. CONTRAST:  75mL OMNIPAQUE IOHEXOL 350 MG/ML SOLN COMPARISON:  01/27/2023, 10/31/2020 FINDINGS: CTA CHEST FINDINGS Cardiovascular: Noncontrast CT of the chest demonstrates no evidence of a thoracic aortic intramural hematoma. Adequate opacification of the thoracic aorta. No thoracic aortic aneurysm or dissection. Three vessel arch with widely patent arch vessels. Aortic and coronary artery atherosclerosis. Central pulmonary vasculature is also well opacified. No central pulmonary arterial filling defects. Reflux of contrast into the IVC and hepatic veins. Heart size is normal. No significant pericardial effusion. Right IJ chest port terminates at the  level of the mid SVC. Mediastinum/Nodes: Mildly enlarged subcarinal lymph nodes measuring up to 10 mm short axis (series 10, image 76). No axillary or hilar lymphadenopathy. Thyroid gland, trachea, and esophagus within normal limits. Lungs/Pleura: Mild emphysema. No focal airspace consolidation. No pleural effusion. No pneumothorax. Musculoskeletal: No chest wall abnormality. No acute or significant osseous findings. Review of the MIP images confirms the above findings. CTA ABDOMEN AND PELVIS FINDINGS VASCULAR Aorta: Normal caliber aorta without aneurysm, dissection, vasculitis or significant stenosis. Mild atherosclerosis. Celiac: Patent without evidence of  aneurysm, dissection, vasculitis or significant stenosis. SMA: Patent without evidence of aneurysm, dissection, vasculitis or significant stenosis. Renals: Both renal arteries are patent without evidence of aneurysm, dissection, vasculitis, fibromuscular dysplasia or significant stenosis. IMA: Patent. Inflow: Patent without evidence of aneurysm, dissection, vasculitis or significant stenosis. Veins: No obvious venous abnormality within the limitations of this arterial phase study. Review of the MIP images confirms the above findings. NON-VASCULAR Hepatobiliary: No focal liver abnormality is seen. No gallstones, gallbladder wall thickening, or biliary dilatation. Pancreas: Unremarkable. No pancreatic ductal dilatation or surrounding inflammatory changes. Spleen: Normal in size without focal abnormality. Adrenals/Urinary Tract: Adrenal glands are unremarkable. Kidneys are normal, without renal calculi, focal lesion, or hydronephrosis. Bladder is unremarkable. Stomach/Bowel: Stomach is within normal limits. Appendix appears normal. Colonic diverticulosis. No evidence of bowel wall thickening, distention, or inflammatory changes. Lymphatic: No abdominopelvic lymphadenopathy. Reproductive: Status post hysterectomy. No adnexal masses. Other: No ascites or  pneumoperitoneum. Musculoskeletal: No significant change in appearance of scattered lucent lesions including dominant lesions in the posterior left acetabulum and L5 vertebral body. No new lesions are identified. No pathologic fractures. Review of the MIP images confirms the above findings. IMPRESSION: 1. No evidence of acute aortic syndrome. 2. Aortic and coronary artery atherosclerosis (ICD10-I70.0). 3. Reflux of contrast into the IVC and hepatic veins, which can be seen in the setting of right heart dysfunction. 4. Mildly enlarged subcarinal lymph nodes, nonspecific. 5. Colonic diverticulosis without evidence of acute diverticulitis. 6. No significant change in appearance of scattered lucent lesions including dominant lesions in the posterior left acetabulum and L5 vertebral body. No new lesions are identified. No pathologic fractures. 7. Mild emphysema (ICD10-J43.9). Electronically Signed   By: Duanne Guess D.O.   On: 07/13/2023 17:19   CT HEAD WO CONTRAST ( ) Result Date: 07/13/2023 CLINICAL DATA:  Headache, increasing frequency or severity EXAM: CT HEAD WITHOUT CONTRAST TECHNIQUE: Contiguous axial images were obtained from the base of the skull through the vertex without intravenous contrast. RADIATION DOSE REDUCTION: This exam was performed according to the departmental dose-optimization program which includes automated exposure control, adjustment of the mA and/or kV according to patient size and/or use of iterative reconstruction technique. COMPARISON:  CT head 03/17/2021. FINDINGS: Brain: No evidence of acute infarction, hemorrhage, hydrocephalus, extra-axial collection or mass lesion/mass effect. Partially empty sella. Vascular: No hyperdense vessel. Skull: Normal. Negative for fracture or focal lesion. Sinuses/Orbits: No acute finding. Other: No mastoid effusions. IMPRESSION: No evidence of acute intracranial abnormality. Electronically Signed   By: Feliberto Harts M.D.   On: 07/13/2023 16:44      Pamella Pert, MD, PhD [facility]  Between 7 am - 7 pm I am available, please contact me via Amion (for emergencies) or Securechat (non urgent messages)  Between 7 pm - 7 am I am not available, please contact night coverage MD/APP via Amion

## 2023-07-14 NOTE — Progress Notes (Signed)
Patient states she feels weak and dizzy. Says she does not feel comfortable going home until she feels like her symptoms have improved. Dr. Lafe Garin aware. Orthostatic vital signs and PT order received. D/c on hold for now.

## 2023-07-14 NOTE — Discharge Summary (Deleted)
Physician Discharge Summary  Tracy [name] BMW:[id number] DOB: 12-Sep-1945 DOA: 07/13/2023  PCP: [name], MD  Admit date: 07/13/2023 Discharge date: 07/14/2023  Admitted From: home Disposition:  home  Recommendations for Outpatient Follow-up:  Follow up with PCP in 1-2 weeks Please obtain BMP/CBC in one week  Home Health: none Equipment/Devices: none  Discharge Condition: stable CODE STATUS: Full code Diet Orders (From admission, onward)     Start     Ordered   07/13/23 2103  Diet regular Room service appropriate? Yes; Fluid consistency: Thin  Diet effective now       Question Answer Comment  Room service appropriate? Yes   Fluid consistency: Thin      07/13/23 2102            HPI: Per admitting MD, Tracy [name] is a 77 y.o. female with medical history significant of breast cancer, hypertension, hyperlipidemia, CAD, multiple myeloma who presents emergency department after syncopal episode.  Patient is been endorsing persistent chest pain and epigastrium for the last week.  She suddenly lost consciousness without any warning fell backwards on the callus without any trauma.  She presented to the ER due to her syncopal episode.  She was found to be afebrile and hemodynamically stable.  Initially she was hypertensive with systolics in 180s and was given hydralazine.  Her chest pain was reproducible to palpation.  LAbs were obtained which showed WBC 7.5, hemoglobin 9.9, creatinine 1.35, troponin 94, 81.  Patient had CT head which showed no acute intracranial abnormalities.  CTA chest abdomen pelvis showed no evidence of dissection. On bedside evaluation patient states that she has been having persistent chest pain for the last week that is reproducible palpation and positional.  No exertional component.  Hospital Course / Discharge diagnoses: Principal problem Presyncopal episode -patient was admitted to the hospital with several days of constant epigastric/lower  chest discomfort as well as presyncopal episode.  She is not sure whether she lost consciousness or not, but felt very lightheaded before this happened.  She was found to have pretty elevated blood pressure so I wonder whether she was hypotensive initially.  She was monitored on telemetry while hospitalized without any significant arrhythmias and has remained in sinus rhythm.  Underwent a 2D echo which showed normal LVEF 65 to 70%, no WMA, grade 1 diastolic dysfunction.  RV was normal.  She has remained normotensive here, however blood pressure has been soft at times into the 90s systolic.  She was consulted for adequate hydration at home, and she does not appear to be on antihypertensives.  Active problems Chest pain-sounds MSK in nature, it is changing with changing position as well as reproducible to palpation in the lower chest area.  She had mild troponin elevation, flat, not in a pattern consistent with ACS Hyperlipidemia-resume home medications History of breast cancer, MM-follows with oncology as an outpatient CKD 3A-baseline creatinine 1.2-1.4, currently at baseline  Sepsis ruled out   Discharge Instructions   Allergies as of 07/14/2023       Reactions   Bacitracin-neomycin-polymyxin  [neomycin-bacitracin Zn-polymyx] Swelling   Nsaids Other (See Comments)   nosebleeds   Tape Hives   Pt cannot tolerate bandaids, tape, or any other adhesives.    Ambien [zolpidem Tartrate]    Hives   Amoxicillin    Rash   Clavulanic Acid Hives   Contrast Media [iodinated Contrast Media] Hives   Pt states hives with prior ct, was given benadryl to resolve  Latex Swelling   Prednisone Swelling   Pt tolerates Dexamethasone. Throat swelling   Tessalon [benzonatate] Itching, Rash        Medication List     TAKE these medications    Accu-Chek Guide test strip Generic drug: glucose blood Use as instructed to check sugars 1-2 times daily.   acyclovir 400 MG tablet Commonly known as:  ZOVIRAX TAKE 1 TABLET BY MOUTH TWICE  DAILY   atorvastatin 10 MG tablet Commonly known as: LIPITOR TAKE 1 TABLET BY MOUTH DAILY   gabapentin 300 MG capsule Commonly known as: NEURONTIN Take 2 capsules (600 mg total) by mouth 3 (three) times daily.   lidocaine 5 % Commonly known as: Lidoderm Place 1 patch onto the skin daily. Remove & Discard patch within 12 hours or as directed by MD What changed:  when to take this reasons to take this additional instructions   lidocaine-prilocaine cream Commonly known as: EMLA Apply 1 Application topically as needed (Apply 1 application to port site 1 hour prior to access as needed).   meclizine 25 MG tablet Commonly known as: ANTIVERT Take 1 tablet (25 mg total) by mouth 3 (three) times daily as needed for dizziness.   metFORMIN 500 MG tablet Commonly known as: GLUCOPHAGE TAKE 1 TABLET BY MOUTH  TWICE DAILY WITH A MEAL   ondansetron 8 MG tablet Commonly known as: ZOFRAN Take 1 tablet (8 mg total) by mouth every 8 (eight) hours as needed for nausea or vomiting.   pantoprazole 40 MG tablet Commonly known as: PROTONIX TAKE 1 TABLET BY MOUTH TWICE  DAILY   tiZANidine 4 MG tablet Commonly known as: ZANAFLEX TAKE 1 TABLET BY MOUTH AT  BEDTIME   traZODone 100 MG tablet Commonly known as: DESYREL TAKE 1 TABLET BY MOUTH AT  BEDTIME   Vitamin D (Ergocalciferol) 1.25 MG (50000 UNIT) Caps capsule Commonly known as: DRISDOL TAKE 1 CAPSULE BY MOUTH ONCE  WEEKLY       Consultations: none  Procedures/Studies:  ECHOCARDIOGRAM COMPLETE Result Date: 07/14/2023    ECHOCARDIOGRAM REPORT   Patient Name:   Tracy [name] Date of Exam: 07/14/2023 Medical Rec #:  [id number]      Height:       60.0 in Accession #:    [id number]     Weight:       178.6 lb Date of Birth:  03/07/1946      BSA:          1.779 m Patient Age:    77 years       BP:           113/39 mmHg Patient Gender: F              HR:           72 bpm. Exam Location:  Inpatient  Procedure: 2D Echo, Cardiac Doppler and Color Doppler Indications:    syncope  History:        Patient has no prior history of Echocardiogram examinations.                 CAD, TIA; Risk Factors:Hypertension and Diabetes.  Sonographer:    Webb Laws Referring Phys: [id number] ROBERT DORRELL IMPRESSIONS  1. Left ventricular ejection fraction, by estimation, is 65 to 70%. The left ventricle has normal function. The left ventricle has no regional wall motion abnormalities. There is mild concentric left ventricular hypertrophy. Left ventricular diastolic parameters are consistent with Grade I diastolic dysfunction (impaired relaxation). The average  left ventricular global longitudinal strain is -19.4 %. The global longitudinal strain is normal.  2. Right ventricular systolic function is normal. The right ventricular size is normal.  3. Left atrial size was mildly dilated.  4. The mitral valve is normal in structure. Trivial mitral valve regurgitation. No evidence of mitral stenosis. Moderate mitral annular calcification.  5. The aortic valve is normal in structure. Aortic valve regurgitation is not visualized. Aortic valve sclerosis/calcification is present, without any evidence of aortic stenosis.  6. The inferior vena cava is normal in size with greater than 50% respiratory variability, suggesting right atrial pressure of 3 mmHg. FINDINGS  Left Ventricle: Left ventricular ejection fraction, by estimation, is 65 to 70%. The left ventricle has normal function. The left ventricle has no regional wall motion abnormalities. The average left ventricular global longitudinal strain is -19.4 %. The global longitudinal strain is normal. The left ventricular internal cavity size was normal in size. There is mild concentric left ventricular hypertrophy. Left ventricular diastolic parameters are consistent with Grade I diastolic dysfunction (impaired relaxation). Right Ventricle: The right ventricular size is normal. No  increase in right ventricular wall thickness. Right ventricular systolic function is normal. Left Atrium: Left atrial size was mildly dilated. Right Atrium: Right atrial size was normal in size. Pericardium: There is no evidence of pericardial effusion. Mitral Valve: The mitral valve is normal in structure. Moderate mitral annular calcification. Trivial mitral valve regurgitation. No evidence of mitral valve stenosis. Tricuspid Valve: The tricuspid valve is normal in structure. Tricuspid valve regurgitation is trivial. No evidence of tricuspid stenosis. Aortic Valve: The aortic valve is normal in structure. Aortic valve regurgitation is not visualized. Aortic valve sclerosis/calcification is present, without any evidence of aortic stenosis. Aortic valve mean gradient measures 8.8 mmHg. Aortic valve peak  gradient measures 19.6 mmHg. Aortic valve area, by VTI measures 2.06 cm. Pulmonic Valve: The pulmonic valve was normal in structure. Pulmonic valve regurgitation is trivial. No evidence of pulmonic stenosis. Aorta: The aortic root is normal in size and structure. Venous: The inferior vena cava is normal in size with greater than 50% respiratory variability, suggesting right atrial pressure of 3 mmHg. IAS/Shunts: No atrial level shunt detected by color flow Doppler.  LEFT VENTRICLE PLAX 2D LVIDd:         3.90 cm     Diastology LVIDs:         1.90 cm     LV e' medial:    8.27 cm/s LV PW:         1.10 cm     LV E/e' medial:  13.1 LV IVS:        1.00 cm     LV e' lateral:   8.38 cm/s LVOT diam:     1.90 cm     LV E/e' lateral: 12.9 LV SV:         75 LV SV Index:   42          2D Longitudinal Strain LVOT Area:     2.84 cm    2D Strain GLS Avg:     -19.4 %  LV Volumes (MOD) LV vol d, MOD A2C: 54.5 ml 3D Volume EF: LV vol d, MOD A4C: 61.2 ml 3D EF:        59 % LV vol s, MOD A2C: 13.6 ml LV EDV:       88 ml LV vol s, MOD A4C: 11.9 ml LV ESV:       35 ml  LV SV MOD A2C:     40.9 ml LV SV:        52 ml LV SV MOD A4C:      61.2 ml LV SV MOD BP:      46.3 ml RIGHT VENTRICLE             IVC RV Basal diam:  2.60 cm     IVC diam: 1.60 cm RV S prime:     18.50 cm/s TAPSE (M-mode): 2.5 cm LEFT ATRIUM             Index        RIGHT ATRIUM           Index LA diam:        3.60 cm 2.02 cm/m   RA Area:     11.30 cm LA Vol (A2C):   27.3 ml 15.35 ml/m  RA Volume:   21.70 ml  12.20 ml/m LA Vol (A4C):   33.5 ml 18.83 ml/m LA Biplane Vol: 31.3 ml 17.60 ml/m  AORTIC VALVE AV Area (Vmax):    2.10 cm AV Area (Vmean):   2.17 cm AV Area (VTI):     2.06 cm AV Vmax:           221.60 cm/s AV Vmean:          136.000 cm/s AV VTI:            0.362 m AV Peak Grad:      19.6 mmHg AV Mean Grad:      8.8 mmHg LVOT Vmax:         164.00 cm/s LVOT Vmean:        104.000 cm/s LVOT VTI:          0.263 m LVOT/AV VTI ratio: 0.73  AORTA Ao Root diam: 2.50 cm Ao Asc diam:  2.90 cm MITRAL VALVE MV Area (PHT): 3.27 cm     SHUNTS MV Decel Time: 232 msec     Systemic VTI:  0.26 m MV E velocity: 108.00 cm/s  Systemic Diam: 1.90 cm MV A velocity: 177.00 cm/s MV E/A ratio:  0.61 Arvilla Meres MD Electronically signed by Arvilla Meres MD Signature Date/Time: 07/14/2023/11:08:02 AM    Final    CT Angio Chest/Abd/Pel for Dissection W and/or Wo Contrast Result Date: 07/13/2023 CLINICAL DATA:  Acute aortic syndrome (AAS) suspected. History of multiple myeloma EXAM: CT ANGIOGRAPHY CHEST, ABDOMEN AND PELVIS TECHNIQUE: Non-contrast CT of the chest was initially obtained. Multidetector CT imaging through the chest, abdomen and pelvis was performed using the standard protocol during bolus administration of intravenous contrast. Multiplanar reconstructed images and MIPs were obtained and reviewed to evaluate the vascular anatomy. RADIATION DOSE REDUCTION: This exam was performed according to the departmental dose-optimization program which includes automated exposure control, adjustment of the mA and/or kV according to patient size and/or use of iterative reconstruction  technique. CONTRAST:  75mL OMNIPAQUE IOHEXOL 350 MG/ML SOLN COMPARISON:  01/27/2023, 10/31/2020 FINDINGS: CTA CHEST FINDINGS Cardiovascular: Noncontrast CT of the chest demonstrates no evidence of a thoracic aortic intramural hematoma. Adequate opacification of the thoracic aorta. No thoracic aortic aneurysm or dissection. Three vessel arch with widely patent arch vessels. Aortic and coronary artery atherosclerosis. Central pulmonary vasculature is also well opacified. No central pulmonary arterial filling defects. Reflux of contrast into the IVC and hepatic veins. Heart size is normal. No significant pericardial effusion. Right IJ chest port terminates at the level of the mid SVC. Mediastinum/Nodes: Mildly enlarged subcarinal lymph nodes  measuring up to 10 mm short axis (series 10, image 76). No axillary or hilar lymphadenopathy. Thyroid gland, trachea, and esophagus within normal limits. Lungs/Pleura: Mild emphysema. No focal airspace consolidation. No pleural effusion. No pneumothorax. Musculoskeletal: No chest wall abnormality. No acute or significant osseous findings. Review of the MIP images confirms the above findings. CTA ABDOMEN AND PELVIS FINDINGS VASCULAR Aorta: Normal caliber aorta without aneurysm, dissection, vasculitis or significant stenosis. Mild atherosclerosis. Celiac: Patent without evidence of aneurysm, dissection, vasculitis or significant stenosis. SMA: Patent without evidence of aneurysm, dissection, vasculitis or significant stenosis. Renals: Both renal arteries are patent without evidence of aneurysm, dissection, vasculitis, fibromuscular dysplasia or significant stenosis. IMA: Patent. Inflow: Patent without evidence of aneurysm, dissection, vasculitis or significant stenosis. Veins: No obvious venous abnormality within the limitations of this arterial phase study. Review of the MIP images confirms the above findings. NON-VASCULAR Hepatobiliary: No focal liver abnormality is seen. No  gallstones, gallbladder wall thickening, or biliary dilatation. Pancreas: Unremarkable. No pancreatic ductal dilatation or surrounding inflammatory changes. Spleen: Normal in size without focal abnormality. Adrenals/Urinary Tract: Adrenal glands are unremarkable. Kidneys are normal, without renal calculi, focal lesion, or hydronephrosis. Bladder is unremarkable. Stomach/Bowel: Stomach is within normal limits. Appendix appears normal. Colonic diverticulosis. No evidence of bowel wall thickening, distention, or inflammatory changes. Lymphatic: No abdominopelvic lymphadenopathy. Reproductive: Status post hysterectomy. No adnexal masses. Other: No ascites or pneumoperitoneum. Musculoskeletal: No significant change in appearance of scattered lucent lesions including dominant lesions in the posterior left acetabulum and L5 vertebral body. No new lesions are identified. No pathologic fractures. Review of the MIP images confirms the above findings. IMPRESSION: 1. No evidence of acute aortic syndrome. 2. Aortic and coronary artery atherosclerosis (ICD10-I70.0). 3. Reflux of contrast into the IVC and hepatic veins, which can be seen in the setting of right heart dysfunction. 4. Mildly enlarged subcarinal lymph nodes, nonspecific. 5. Colonic diverticulosis without evidence of acute diverticulitis. 6. No significant change in appearance of scattered lucent lesions including dominant lesions in the posterior left acetabulum and L5 vertebral body. No new lesions are identified. No pathologic fractures. 7. Mild emphysema (ICD10-J43.9). Electronically Signed   By: Duanne Guess D.O.   On: 07/13/2023 17:19   CT HEAD WO CONTRAST ( ) Result Date: 07/13/2023 CLINICAL DATA:  Headache, increasing frequency or severity EXAM: CT HEAD WITHOUT CONTRAST TECHNIQUE: Contiguous axial images were obtained from the base of the skull through the vertex without intravenous contrast. RADIATION DOSE REDUCTION: This exam was performed  according to the departmental dose-optimization program which includes automated exposure control, adjustment of the mA and/or kV according to patient size and/or use of iterative reconstruction technique. COMPARISON:  CT head 03/17/2021. FINDINGS: Brain: No evidence of acute infarction, hemorrhage, hydrocephalus, extra-axial collection or mass lesion/mass effect. Partially empty sella. Vascular: No hyperdense vessel. Skull: Normal. Negative for fracture or focal lesion. Sinuses/Orbits: No acute finding. Other: No mastoid effusions. IMPRESSION: No evidence of acute intracranial abnormality. Electronically Signed   By: Feliberto Harts M.D.   On: 07/13/2023 16:44   DG Chest Port 1 View Result Date: 07/13/2023 CLINICAL DATA:  near syncope EXAM: PORTABLE CHEST - 1 VIEW COMPARISON:  03/16/2023. FINDINGS: Cardiac silhouette is unremarkable. No pneumothorax or pleural effusion. The lungs are clear. Aorta is calcified. There are thoracic degenerative changes. Right-sided Port-A-Cath tip overlies mid SVC. IMPRESSION: No acute cardiopulmonary process. Electronically Signed   By: Layla Maw M.D.   On: 07/13/2023 14:22     Subjective: - no chest pain, shortness of breath,  no abdominal pain, nausea or vomiting.   Discharge Exam: BP (!) 113/39   Pulse 67   Temp 98.5 F (36.9 C)   Resp 18   Ht 5' (1.524 m)   Wt 81 kg   SpO2 97%   BMI 34.88 kg/m   General: Pt is alert, awake, not in acute distress Cardiovascular: RRR, S1/S2 +, no rubs, no gallops Respiratory: CTA bilaterally, no wheezing, no rhonchi Abdominal: Soft, NT, ND, bowel sounds + Extremities: no edema, no cyanosis    The results of significant diagnostics from this hospitalization (including imaging, microbiology, ancillary and laboratory) are listed below for reference.     Microbiology: Recent Results (from the past 240 hours)  Resp panel by RT-PCR (RSV, Flu A&B, Covid) Anterior Nasal Swab     Status: None   Collection Time:  07/13/23  2:33 PM   Specimen: Anterior Nasal Swab  Result Value Ref Range Status   SARS Coronavirus 2 by RT PCR NEGATIVE NEGATIVE Final   Influenza A by PCR NEGATIVE NEGATIVE Final   Influenza B by PCR NEGATIVE NEGATIVE Final    Comment: (NOTE) The Xpert Xpress SARS-CoV-2/FLU/RSV plus assay is intended as an aid in the diagnosis of influenza from Nasopharyngeal swab specimens and should not be used as a sole basis for treatment. Nasal washings and aspirates are unacceptable for Xpert Xpress SARS-CoV-2/FLU/RSV testing.  Fact Sheet for Patients: BloggerCourse.com  Fact Sheet for Healthcare Providers: SeriousBroker.it  This test is not yet approved or cleared by the [place] FDA and has been authorized for detection and/or diagnosis of SARS-CoV-2 by FDA under an Emergency Use Authorization (EUA). This EUA will remain in effect (meaning this test can be used) for the duration of the COVID-19 declaration under Section 564(b)(1) of the Act, 21 U.S.C. section 360bbb-3(b)(1), unless the authorization is terminated or revoked.     Resp Syncytial Virus by PCR NEGATIVE NEGATIVE Final    Comment: (NOTE) Fact Sheet for Patients: BloggerCourse.com  Fact Sheet for Healthcare Providers: SeriousBroker.it  This test is not yet approved or cleared by the [place] FDA and has been authorized for detection and/or diagnosis of SARS-CoV-2 by FDA under an Emergency Use Authorization (EUA). This EUA will remain in effect (meaning this test can be used) for the duration of the COVID-19 declaration under Section 564(b)(1) of the Act, 21 U.S.C. section 360bbb-3(b)(1), unless the authorization is terminated or revoked.  Performed at [facility], [address], [place], [place] [address]      Labs: Basic Metabolic Panel: Recent Labs  Lab 07/13/23 1511  NA 137  K 3.9  CL 104   CO2 22  GLUCOSE 112*  BUN 9  CREATININE 1.35*  CALCIUM 9.3   Liver Function Tests: Recent Labs  Lab 07/13/23 1511  AST 16  ALT 12  ALKPHOS 70  BILITOT 0.5  PROT 6.2*  ALBUMIN 3.2*   CBC: Recent Labs  Lab 07/13/23 1511  WBC 7.5  HGB 9.9*  HCT 30.1*  MCV 95.9  PLT 326   CBG: No results for input(s): "GLUCAP" in the last 168 hours. Hgb A1c No results for input(s): "HGBA1C" in the last 72 hours. Lipid Profile No results for input(s): "CHOL", "HDL", "LDLCALC", "TRIG", "CHOLHDL", "LDLDIRECT" in the last 72 hours. Thyroid function studies No results for input(s): "TSH", "T4TOTAL", "T3FREE", "THYROIDAB" in the last 72 hours.  Invalid input(s): "FREET3" Urinalysis    Component Value Date/Time   COLORURINE YELLOW 04/30/2021 2118   APPEARANCEUR CLEAR 04/30/2021  2118   LABSPEC 1.023 04/30/2021 2118   PHURINE 5.0 04/30/2021 2118   GLUCOSEU NEGATIVE 04/30/2021 2118   HGBUR NEGATIVE 04/30/2021 2118   BILIRUBINUR 1+ 06/07/2021 1516   KETONESUR TRACE (A) 04/30/2021 2118   PROTEINUR Negative 01/08/2023 1448   PROTEINUR TRACE (A) 04/30/2021 2118   UROBILINOGEN 0.2 01/08/2023 1448   UROBILINOGEN 0.2 04/30/2021 1343   NITRITE neg 06/07/2021 1516   NITRITE NEGATIVE 04/30/2021 2118   LEUKOCYTESUR Moderate (2+) (A) 01/08/2023 1448   LEUKOCYTESUR NEGATIVE 04/30/2021 2118    FURTHER DISCHARGE INSTRUCTIONS:   Get Medicines reviewed and adjusted: Please take all your medications with you for your next visit with your Primary MD   Laboratory/radiological data: Please request your Primary MD to go over all hospital tests and procedure/radiological results at the follow up, please ask your Primary MD to get all Hospital records sent to his/her office.   In some cases, they will be blood work, cultures and biopsy results pending at the time of your discharge. Please request that your primary care M.D. goes through all the records of your hospital data and follows up on these  results.   Also Note the following: If you experience worsening of your admission symptoms, develop shortness of breath, life threatening emergency, suicidal or homicidal thoughts you must seek medical attention immediately by calling 911 or calling your MD immediately  if symptoms less severe.   You must read complete instructions/literature along with all the possible adverse reactions/side effects for all the Medicines you take and that have been prescribed to you. Take any new Medicines after you have completely understood and accpet all the possible adverse reactions/side effects.    Do not drive when taking Pain medications or sleeping medications (Benzodaizepines)   Do not take more than prescribed Pain, Sleep and Anxiety Medications. It is not advisable to combine anxiety,sleep and pain medications without talking with your primary care practitioner   Special Instructions: If you have smoked or chewed Tobacco  in the last 2 yrs please stop smoking, stop any regular Alcohol  and or any Recreational drug use.   Wear Seat belts while driving.   Please note: You were cared for by a hospitalist during your hospital stay. Once you are discharged, your primary care physician will handle any further medical issues. Please note that NO REFILLS for any discharge medications will be authorized once you are discharged, as it is imperative that you return to your primary care physician (or establish a relationship with a primary care physician if you do not have one) for your post hospital discharge needs so that they can reassess your need for medications and monitor your lab values.  Time coordinating discharge: 35 minutes  SIGNED:  Pamella Pert, MD, PhD 07/14/2023, 12:07 PM

## 2023-07-14 NOTE — Progress Notes (Addendum)
    Durable Medical Equipment  (From admission, onward)           Start     Ordered   07/14/23 1539  For home use only DME Bedside commode  Once       Comments: Pt's fatigue, difficulty focusing her eyes, instability in sitting/standing, generalized weakness and decreased activity tolerance necessitate recommendation for bedside commode.  Question:  Patient needs a bedside commode to treat with the following condition  Answer:  Weakness   07/14/23 1543               Durable Medical Equipment  (From admission, onward)           Start     Ordered   07/14/23 1605  For home use only DME 4 wheeled rolling walker with seat  Once       Question:  Patient needs a walker to treat with the following condition  Answer:  Gait instability   07/14/23 1604   07/14/23 1539  For home use only DME Bedside commode  Once       Comments: Pt's fatigue, difficulty focusing her eyes, instability in sitting/standing, generalized weakness and decreased activity tolerance necessitate recommendation for bedside commode.  Question:  Patient needs a bedside commode to treat with the following condition  Answer:  Weakness   07/14/23 1543

## 2023-07-14 NOTE — TOC Progression Note (Addendum)
Transition of Care [facility]) - Progression Note    Patient Details  Name: Tracy [name] MRN: [id number] Date of Birth: Apr 24, 1946  Transition of Care [facility]) CM/SW Contact  Tom-Johnson, Hershal Coria, RN Phone Number: 07/14/2023, 3:08 PM  Clinical Narrative:     Patient was scheduled to discharge home today, PT assessed patient after discharge orders placed, now recommending SNF.  LCSW will follow for placement.   15:40- CM informed that patient declined SNF, request going home with home health, has no preference. CM sent referral to [facility] and Marylene Land noted acceptance, info on AVS. Rollator and BSC ordered from [employer], Jermaine to deliver to patient at bedside prior to discharge.  CM will continue to follow as patient progresses with care towards discharge.            Barriers to Discharge: Barriers Resolved  Expected Discharge Plan and Services         Expected Discharge Date: 07/14/23               DME Arranged: N/A DME Agency: NA       HH Arranged: NA HH Agency: NA         Social Determinants of Health (SDOH) Interventions SDOH Screenings   Food Insecurity: No Food Insecurity (07/14/2023)  Housing: Low Risk  (07/14/2023)  Transportation Needs: Unmet Transportation Needs (07/14/2023)  Utilities: At Risk (07/14/2023)  Alcohol Screen: Low Risk  (12/20/2021)  Depression (PHQ2-9): Medium Risk (04/30/2023)  Financial Resource Strain: High Risk (12/26/2022)  Physical Activity: Inactive (12/26/2022)  Social Connections: Moderately Isolated (12/26/2022)  Stress: Stress Concern Present (12/26/2022)  Tobacco Use: Medium Risk (05/30/2023)    Readmission Risk Interventions     No data to display

## 2023-07-14 NOTE — Progress Notes (Signed)
Physical Therapy Treatment Patient Details Name: Tracy [name] MRN: [id number] DOB: [birth year] Today's Date: 07/14/2023   History of Present Illness Pt is 77 year old presented to [facility] on  07/13/23 for possible syncopal episode and chest pain. Work up negative. PMH - breast CA, htn, CAD, multiple myeloma, vertigo    PT Comments  PT follows up for vestibular assessment given symptoms on eval today. Pt reports symptoms as fatigue, weakness, and difficulty focusing her eyes. She also reports instability in sitting/standing. Pt does have a history of BPPV and states that these symptoms are much different than the prior dizziness which she had with BPPV. This PT notes no nystagmus during session, although the pt does have significant difficulty with pursuits in all directions. VOR x1 reproduces symptoms and head impulse test to left does as well. Roll test is negative for symptoms. Pt does report recent symptoms which were not associated with changes in position of head or body. Based on pt reports these symptoms appear unlikely to be related to a peripheral vestibula cause. Pt remains unsteady and cannot tolerate much activity. Pt appears unable to safely care for herself at this time and has little reliable caregiver support. Patient will benefit from continued inpatient follow up therapy, <3 hours/day.   If plan is discharge home, recommend the following: A little help with bathing/dressing/bathroom;A little help with walking and/or transfers;Assist for transportation;Help with stairs or ramp for entrance;Assistance with cooking/housework   Can travel by private vehicle     Yes  Equipment Recommendations  Rolling walker (2 wheels)    Recommendations for Other Services       Precautions / Restrictions Precautions Precautions: Fall;Other (comment) Precaution Comments: jerky, tremulous, weakness, lightheadedness Restrictions Weight Bearing Restrictions Per Provider Order: No     Mobility   Bed Mobility Overal bed mobility: Modified Independent                  Transfers Overall transfer level: Needs assistance Equipment used: Rolling walker (2 wheels) Transfers: Sit to/from Stand Sit to Stand: Contact guard assist                Ambulation/Gait Ambulation/Gait assistance: Min assist Gait Distance (Feet): 10 Feet Assistive device: Rolling walker (2 wheels) Gait Pattern/deviations: Step-to pattern Gait velocity: reduced Gait velocity interpretation: <1.8 ft/sec, indicate of risk for recurrent falls   General Gait Details: slowed step-to gait, pt with multiple instances of stoppage of gait, appearing to collect herself and stabilize. PT notes increased lateral sway throughout ambulation   Stairs             Wheelchair Mobility     Tilt Bed    Modified Rankin (Stroke Patients Only)       Balance Overall balance assessment: Needs assistance Sitting-balance support: No upper extremity supported, Feet supported Sitting balance-Leahy Scale: Good     Standing balance support: Bilateral upper extremity supported, Reliant on assistive device for balance Standing balance-Leahy Scale: Poor                              Cognition Arousal: Alert Behavior During Therapy: WFL for tasks assessed/performed Overall Cognitive Status: Within Functional Limits for tasks assessed                                          Exercises  General Comments General comments (skin integrity, edema, etc.): HR stable. Vestibular assessment: pt with very slowed pursuits with corrective saccades in all directions but more consistently with tracking to left. PT notes no definitive nystagmus during session, VOR x1 reproduces symptoms and head impulse test to left does as well. Roll test is negative for symptoms. Pt denies dizziness during session, instead reports feeling lightheaded, tremulous, and unable to focus her eyes. Pt also notes  times when symptoms have been present without associated head movement when lying still in bed      Pertinent Vitals/Pain Pain Assessment Pain Assessment: No/denies pain    Home Living Family/patient expects to be discharged to:: Private residence Living Arrangements: Alone Available Help at Discharge: Friend(s);Family;Available PRN/intermittently Type of Home: Apartment Home Access: Stairs to enter Entrance Stairs-Rails: Right;Left Entrance Stairs-Number of Steps: 5+5   Home Layout: One level Home Equipment: Cane - single point;Shower seat      Prior Function            PT Goals (current goals can now be found in the care plan section) Acute Rehab PT Goals Patient Stated Goal: get better PT Goal Formulation: With patient Time For Goal Achievement: 07/28/23 Potential to Achieve Goals: Good Progress towards PT goals: Not progressing toward goals - comment (remains limited by symptoms)    Frequency    Min 1X/week      PT Plan      Co-evaluation              AM-PAC PT "6 Clicks" Mobility   Outcome Measure  Help needed turning from your back to your side while in a flat bed without using bedrails?: None Help needed moving from lying on your back to sitting on the side of a flat bed without using bedrails?: A Little Help needed moving to and from a bed to a chair (including a wheelchair)?: A Little Help needed standing up from a chair using your arms (e.g., wheelchair or bedside chair)?: A Little Help needed to walk in hospital room?: A Lot Help needed climbing 3-5 steps with a railing? : Total 6 Click Score: 16    End of Session Equipment Utilized During Treatment: Gait belt Activity Tolerance: Patient limited by fatigue Patient left: in bed;with call bell/phone within reach;with bed alarm set Nurse Communication: Mobility status PT Visit Diagnosis: Unsteadiness on feet (R26.81);Other abnormalities of gait and mobility (R26.89);Dizziness and giddiness  (R42)     Time: 8469-6295 PT Time Calculation (min) (ACUTE ONLY): 50 min  Charges:    $Gait Training: 8-22 mins $Therapeutic Activity: 23-37 mins PT General Charges $$ ACUTE PT VISIT: 1 Visit                     Arlyss Gandy, PT, DPT Acute Rehabilitation Office [phone]    Arlyss Gandy 07/14/2023, 3:13 PM

## 2023-07-14 NOTE — Care Management Obs Status (Signed)
MEDICARE OBSERVATION STATUS NOTIFICATION   Patient Details  Name: Tracy [name] MRN: [id number] Date of Birth: 1946/03/16   Medicare Observation Status Notification Given:  Yes    Ronny Bacon, RN 07/14/2023, 9:33 AM

## 2023-07-14 NOTE — Evaluation (Signed)
Physical Therapy Evaluation Patient Details Name: Tracy [name] MRN: [id number] DOB: 29-May-1946 Today's Date: 07/14/2023  History of Present Illness  Pt is 77 year old presented to [facility] on  07/13/23 for possible syncopal episode and chest pain. Work up negative. PMH - breast CA, htn, CAD, multiple myeloma, vertigo  Clinical Impression  Pt admitted with above diagnosis and presents to PT with functional limitations due to deficits listed below (See PT problem list). Pt needs skilled PT to maximize independence and safety. Pt very limited with mobility due to new onset of weakness and dizziness over the last week. Reports a history of vertigo but reports this feels different than any prior vertigo. Orthostatics were negative. Pt reports about a week ago she felt so weak and dizzy that she basically fell onto the commode and the same thing happened just prior to admission but fell forward near the couch. She does not feel that she passed out. Lives alone with limited support. Patient will benefit from continued inpatient follow up therapy, <3 hours/day. Asked a vestibular trained therapist to come assess her.   Orthostatic BPs  Sitting 124/51  Standing 141/79  Standing after 3 min 146/57              If plan is discharge home, recommend the following: A little help with bathing/dressing/bathroom;A little help with walking and/or transfers;Assist for transportation;Help with stairs or ramp for entrance;Assistance with cooking/housework   Can travel by private vehicle   Yes    Equipment Recommendations Rolling walker (2 wheels)  Recommendations for Other Services       Functional Status Assessment Patient has had a recent decline in their functional status and demonstrates the ability to make significant improvements in function in a reasonable and predictable amount of time.     Precautions / Restrictions Precautions Precautions: Fall;Other (comment) Precaution Comments: jerky,  tremulous, dizzy Restrictions Weight Bearing Restrictions Per Provider Order: No      Mobility  Bed Mobility               General bed mobility comments: Pt sitting EOB    Transfers Overall transfer level: Needs assistance Equipment used: Rolling walker (2 wheels) Transfers: Sit to/from Stand Sit to Stand: Min assist           General transfer comment: Assist for balance as pt rises she has jerking movements and tends to fall backwards. Verbal cues for hand placement    Ambulation/Gait Ambulation/Gait assistance: Min assist Gait Distance (Feet): 10 Feet Assistive device: Rolling walker (2 wheels) Gait Pattern/deviations: Step-through pattern, Decreased stride length, Wide base of support Gait velocity: decr Gait velocity interpretation: <1.8 ft/sec, indicate of risk for recurrent falls   General Gait Details: Pt keeping eyes closed and when opened becomes dizzy and off balance. Needs assist for balance and support  Stairs            Wheelchair Mobility     Tilt Bed    Modified Rankin (Stroke Patients Only)       Balance Overall balance assessment: Needs assistance Sitting-balance support: No upper extremity supported, Feet supported Sitting balance-Leahy Scale: Fair     Standing balance support: Bilateral upper extremity supported, During functional activity, Reliant on assistive device for balance Standing balance-Leahy Scale: Poor Standing balance comment: walker and min assist for static standing  Pertinent Vitals/Pain Pain Assessment Pain Assessment: No/denies pain    Home Living Family/patient expects to be discharged to:: Private residence Living Arrangements: Alone Available Help at Discharge: Friend(s);Family;Available PRN/intermittently Type of Home: Apartment Home Access: Stairs to enter Entrance Stairs-Rails: Right;Left Entrance Stairs-Number of Steps: 5+5   Home Layout: One level Home  Equipment: Cane - single point;Shower seat      Prior Function Prior Level of Function : Independent/Modified Independent;Driving             Mobility Comments: Uses cane due to arthritis       Extremity/Trunk Assessment   Upper Extremity Assessment Upper Extremity Assessment: Generalized weakness    Lower Extremity Assessment Lower Extremity Assessment: Generalized weakness    Cervical / Trunk Assessment Cervical / Trunk Assessment: Kyphotic  Communication   Communication Communication: No apparent difficulties  Cognition Arousal: Alert Behavior During Therapy: WFL for tasks assessed/performed Overall Cognitive Status: Within Functional Limits for tasks assessed                                          General Comments General comments (skin integrity, edema, etc.): VSS. See assessment for BP readings    Exercises     Assessment/Plan    PT Assessment Patient needs continued PT services  PT Problem List Decreased strength;Decreased balance;Decreased mobility;Decreased activity tolerance       PT Treatment Interventions DME instruction;Gait training;Stair training;Functional mobility training;Therapeutic activities;Therapeutic exercise;Balance training;Patient/family education    PT Goals (Current goals can be found in the Care Plan section)  Acute Rehab PT Goals Patient Stated Goal: get better PT Goal Formulation: With patient Time For Goal Achievement: 07/28/23 Potential to Achieve Goals: Good    Frequency Min 1X/week     Co-evaluation               AM-PAC PT "6 Clicks" Mobility  Outcome Measure Help needed turning from your back to your side while in a flat bed without using bedrails?: None Help needed moving from lying on your back to sitting on the side of a flat bed without using bedrails?: A Little Help needed moving to and from a bed to a chair (including a wheelchair)?: A Little Help needed standing up from a chair  using your arms (e.g., wheelchair or bedside chair)?: A Little Help needed to walk in hospital room?: Total Help needed climbing 3-5 steps with a railing? : Total 6 Click Score: 15    End of Session Equipment Utilized During Treatment: Gait belt Activity Tolerance: Other (comment) (limited by dizziness) Patient left: in bed;with call bell/phone within reach Nurse Communication: Mobility status PT Visit Diagnosis: Unsteadiness on feet (R26.81);Other abnormalities of gait and mobility (R26.89);Dizziness and giddiness (R42)    Time: 1310-1345 PT Time Calculation (min) (ACUTE ONLY): 35 min   Charges:   PT Evaluation $PT Eval Moderate Complexity: 1 Mod PT Treatments $Gait Training: 8-22 mins PT General Charges $$ ACUTE PT VISIT: 1 Visit         [facility] PT Acute Rehabilitation Services Office [phone]   Angelina Ok [facility] 07/14/2023, 2:20 PM

## 2023-07-15 ENCOUNTER — Telehealth: Payer: Self-pay | Admitting: *Deleted

## 2023-07-15 ENCOUNTER — Other Ambulatory Visit (HOSPITAL_COMMUNITY): Payer: Self-pay

## 2023-07-15 DIAGNOSIS — R55 Syncope and collapse: Secondary | ICD-10-CM | POA: Diagnosis not present

## 2023-07-15 MED ORDER — SODIUM CHLORIDE 0.9 % IV BOLUS
1000.0000 mL | INTRAVENOUS | Status: AC
  Administered 2023-07-15: 1000 mL via INTRAVENOUS

## 2023-07-15 MED ORDER — HYDROCODONE-ACETAMINOPHEN 5-325 MG PO TABS
1.0000 | ORAL_TABLET | Freq: Four times a day (QID) | ORAL | 0 refills | Status: DC | PRN
  Filled 2023-07-15: qty 12, 3d supply, fill #0

## 2023-07-15 MED ORDER — TRAMADOL HCL 50 MG PO TABS
25.0000 mg | ORAL_TABLET | Freq: Once | ORAL | Status: AC
  Administered 2023-07-15: 25 mg via ORAL
  Filled 2023-07-15: qty 1

## 2023-07-15 NOTE — Plan of Care (Signed)

## 2023-07-15 NOTE — TOC Transition Note (Signed)
Transition of Care [facility]) - Discharge Note   Patient Details  Name: Tracy [name] MRN: [id number] Date of Birth: 01-Jun-1946  Transition of Care [facility]) CM/SW Contact:  Tom-Johnson, Hershal Coria, RN Phone Number: 07/15/2023, 1:08 PM   Clinical Narrative:     Patient is scheduled for discharge today.  Readmission Risk Assessment done. Home health info, Outpatient f/u, hospital f/u and discharge instructions on AVS. Rollator ans BSC ordered from [employer] and delivered to patient at bedside. [facility] to transport at discharge.  No further TOC needs noted.             Final next level of care: Home w Home Health Services Barriers to Discharge: Barriers Resolved   Patient Goals and CMS Choice Patient states their goals for this hospitalization and ongoing recovery are:: To return home CMS Medicare.gov Compare Post Acute Care list provided to:: Patient Choice offered to / list presented to : NA      Discharge Placement                Patient to be transferred to facility by: Elesa Hacker Friend Name of family member notified: Katherina Right    Discharge Plan and Services Additional resources added to the After Visit Summary for                  DME Arranged: Bedside commode, Walker rolling with seat DME Agency: [employer] Date DME Agency Contacted: 07/14/23 Time DME Agency Contacted: [phone] Representative spoke with at DME Agency: Vaughan Basta HH Arranged: PT, OT, RN, Disease Management HH Agency: [facility] Date [facility] Agency Contacted: 07/14/23 Time HH Agency Contacted: [phone]    Social Drivers of Health (SDOH) Interventions SDOH Screenings   Food Insecurity: No Food Insecurity (07/14/2023)  Housing: Low Risk  (07/14/2023)  Transportation Needs: Unmet Transportation Needs (07/14/2023)  Utilities: At Risk (07/14/2023)  Alcohol Screen: Low Risk  (12/20/2021)  Depression (PHQ2-9): Medium Risk (04/30/2023)  Financial Resource Strain: High  Risk (12/26/2022)  Physical Activity: Inactive (12/26/2022)  Social Connections: Moderately Isolated (12/26/2022)  Stress: Stress Concern Present (12/26/2022)  Tobacco Use: Medium Risk (05/30/2023)     Readmission Risk Interventions     No data to display

## 2023-07-15 NOTE — Progress Notes (Signed)
Physical Therapy Treatment Patient Details Name: Tracy [name] MRN: [id number] DOB: [birth year] Today's Date: 07/15/2023   History of Present Illness Pt is 77 year old presented to [facility] on  07/13/23 for possible syncopal episode and chest pain. Work up negative. PMH - breast CA, htn, CAD, multiple myeloma, vertigo    PT Comments  Continuing work on functional mobility and activity tolerance;  Session focused on gait training, and stair training, with education given re: managing at home, as pt is choosing to dc home instead of SNF for rehab; Pt still has decr balance, and incr sway, especially posteriorly, in static standing; she seems to benefit from having momentum with balance with walking; stair training done; Rec [facility] services, and pt with consistent follow up with PCP   If plan is discharge home, recommend the following: A little help with bathing/dressing/bathroom;A little help with walking and/or transfers;Assist for transportation;Help with stairs or ramp for entrance;Assistance with cooking/housework   Can travel by private vehicle     Yes  Environmental manager (4 wheels)    Recommendations for Other Services       Precautions / Restrictions Precautions Precautions: Fall;Other (comment) Precaution Comments: jerky, tremulous, weakness, lightheadedness Restrictions Weight Bearing Restrictions Per Provider Order: No     Mobility  Bed Mobility                    Transfers Overall transfer level: Needs assistance Equipment used: Rollator (4 wheels) Transfers: Sit to/from Stand Sit to Stand: Supervision           General transfer comment: supervision for safety; occasional cues for brakes    Ambulation/Gait Ambulation/Gait assistance: Contact guard assist Gait Distance (Feet): 80 Feet Assistive device: Rollator (4 wheels) Gait Pattern/deviations: Decreased step length - right, Decreased step length - left       General Gait  Details: More stable today; did not need to stop while she visually searched for exit sign   Stairs Stairs: Yes Stairs assistance: Contact guard assist Stair Management: One rail Right, One rail Left, Forwards, Step to pattern (Left rail ascending; r rail descending) Number of Stairs: 12 General stair comments: Close guard for safety; more guarded with descending stairs   Wheelchair Mobility     Tilt Bed    Modified Rankin (Stroke Patients Only)       Balance     Sitting balance-Leahy Scale: Good       Standing balance-Leahy Scale: Poor Standing balance comment: static standing poor balance, starts to fall backwards without support.                            Cognition Arousal: Alert Behavior During Therapy: WFL for tasks assessed/performed Overall Cognitive Status: Within Functional Limits for tasks assessed                                          Exercises      General Comments General comments (skin integrity, edema, etc.): BP sitting 133/66, standing 141/67 during dizzy spell, able to complete stairs after with no dizziness.      Pertinent Vitals/Pain Pain Assessment Pain Assessment: No/denies pain    Home Living Family/patient expects to be discharged to:: Private residence Living Arrangements: Alone Available Help at Discharge: Friend(s);Family;Available PRN/intermittently Type of Home: Apartment Home Access: Stairs to enter  Entrance Stairs-Rails: Right;Left Entrance Stairs-Number of Steps: 5+5   Home Layout: One level Home Equipment: Cane - single point;Shower seat Additional Comments: Pt lives alone, grandson assists sometimes if needed    Prior Function            PT Goals (current goals can now be found in the care plan section) Acute Rehab PT Goals Patient Stated Goal: get better PT Goal Formulation: With patient Time For Goal Achievement: 07/28/23 Potential to Achieve Goals: Good Progress towards PT  goals: Progressing toward goals    Frequency    Min 1X/week      PT Plan      Co-evaluation              AM-PAC PT "6 Clicks" Mobility   Outcome Measure  Help needed turning from your back to your side while in a flat bed without using bedrails?: None Help needed moving from lying on your back to sitting on the side of a flat bed without using bedrails?: None Help needed moving to and from a bed to a chair (including a wheelchair)?: A Little Help needed standing up from a chair using your arms (e.g., wheelchair or bedside chair)?: A Little Help needed to walk in hospital room?: A Little Help needed climbing 3-5 steps with a railing? : A Little 6 Click Score: 20    End of Session Equipment Utilized During Treatment: Gait belt Activity Tolerance: Patient limited by fatigue Patient left: in bed;with call bell/phone within reach;with bed alarm set Nurse Communication: Mobility status PT Visit Diagnosis: Unsteadiness on feet (R26.81);Other abnormalities of gait and mobility (R26.89);Dizziness and giddiness (R42)     Time: 9629-5284 PT Time Calculation (min) (ACUTE ONLY): 23 min  Charges:    $Gait Training: 8-22 mins PT General Charges $$ ACUTE PT VISIT: 1 Visit                     Van Clines, PT  Acute Rehabilitation Services Office [phone] Secure Chat welcomed    Tracy [name] 07/15/2023, 2:17 PM

## 2023-07-15 NOTE — Evaluation (Signed)
Occupational Therapy Evaluation Patient Details Name: Tracy [name] MRN: [id number] DOB: 11/06/1945 Today's Date: 07/15/2023   History of Present Illness Pt is 77 year old presented to [facility] on  07/13/23 for possible syncopal episode and chest pain. Work up negative. PMH - breast CA, htn, CAD, multiple myeloma, vertigo   Clinical Impression   Pt in good spirits, eager to return home. Pt lives alone, has some assistance for heavy groceries, occasional help if needed, 5+5 steps to enter home. Pt PLOF mod I with cane, drives, does her own shopping, assistance with heavy items. Pt currently close to baseline with ADLs, needs assistance with ambulation using Rollator. Pt able to perform ADLs and gather items needed for dressing mod I with rollator, had one instance of dizziness with significant imbalance, BP 133/66 and 141/67 sitting/standing. After a few minutes Pt felt better, able to walk down hall and complete stairs, PT assisted and took over with stairs. Pt educated on safety awareness, correct use of Rollator, Pt demonstrated proper use of Rollator, some cueing to lock Rollator and for correct placement. Pt would benefit from [facility] follow up to ensure safety in home setting, will continue to see acutely to progress as able, plans to DC home today.       If plan is discharge home, recommend the following: A little help with walking and/or transfers;A little help with bathing/dressing/bathroom;Assistance with cooking/housework;Assist for transportation;Help with stairs or ramp for entrance    Functional Status Assessment  Patient has had a recent decline in their functional status and demonstrates the ability to make significant improvements in function in a reasonable and predictable amount of time.  Equipment Recommendations  None recommended by OT    Recommendations for Other Services       Precautions / Restrictions Precautions Precautions: Fall;Other (comment) Precaution Comments:  jerky, tremulous, weakness, lightheadedness Restrictions Weight Bearing Restrictions Per Provider Order: No      Mobility Bed Mobility Overal bed mobility: Modified Independent                  Transfers Overall transfer level: Needs assistance Equipment used: Rollator (4 wheels) Transfers: Sit to/from Stand Sit to Stand: Supervision           General transfer comment: supervision for safety      Balance Overall balance assessment: Needs assistance Sitting-balance support: No upper extremity supported, Feet supported Sitting balance-Leahy Scale: Good     Standing balance support: Bilateral upper extremity supported, During functional activity Standing balance-Leahy Scale: Poor Standing balance comment: static standing poor balance, starts to fall backwards without support.                           ADL either performed or assessed with clinical judgement   ADL Overall ADL's : At baseline;Modified independent                                       General ADL Comments: Pt close to baseline, supervision only for ADLs/mobility, one instance of dizziness with imbalance, min A to recover balance, rest break, VSS, able to complete stairs no problem after     Vision Baseline Vision/History: 1 Wears glasses Ability to See in Adequate Light: 0 Adequate       Perception         Praxis  Pertinent Vitals/Pain Pain Assessment Pain Assessment: No/denies pain     Extremity/Trunk Assessment Upper Extremity Assessment Upper Extremity Assessment: RUE deficits/detail;LUE deficits/detail RUE Deficits / Details: generalized weakness, shoudler stiffness RUE Sensation: WNL RUE Coordination: WNL LUE Deficits / Details: generalized weakness, decreased shoulder ER, limited shoulder ROM, overall functional LUE: Shoulder pain with ROM LUE Sensation: WNL LUE Coordination: decreased gross motor   Lower Extremity Assessment Lower  Extremity Assessment: Defer to PT evaluation       Communication Communication Communication: No apparent difficulties   Cognition Arousal: Alert Behavior During Therapy: WFL for tasks assessed/performed Overall Cognitive Status: Within Functional Limits for tasks assessed                                       General Comments  BP sitting 133/66, standing 141/67 during dizzy spell, able to complete stairs after with no dizziness.    Exercises     Shoulder Instructions      Home Living Family/patient expects to be discharged to:: Private residence Living Arrangements: Alone Available Help at Discharge: Friend(s);Family;Available PRN/intermittently Type of Home: Apartment Home Access: Stairs to enter Entrance Stairs-Number of Steps: 5+5 Entrance Stairs-Rails: Right;Left Home Layout: One level     Bathroom Shower/Tub: Chief Strategy Officer: Handicapped height     Home Equipment: Cane - single point;Shower seat   Additional Comments: Pt lives alone, grandson assists sometimes if needed      Prior Functioning/Environment Prior Level of Function : Independent/Modified Independent;Driving             Mobility Comments: Uses cane due to arthritis ADLs Comments: ind, some heavy groceries delivered        OT Problem List: Decreased strength;Decreased range of motion;Impaired balance (sitting and/or standing)      OT Treatment/Interventions: Self-care/ADL training;Therapeutic exercise;Energy conservation;DME and/or AE instruction;Therapeutic activities;Balance training;Patient/family education    OT Goals(Current goals can be found in the care plan section) Acute Rehab OT Goals Patient Stated Goal: to return home OT Goal Formulation: With patient Time For Goal Achievement: 07/29/23 Potential to Achieve Goals: Good  OT Frequency: Min 1X/week    Co-evaluation              AM-PAC OT "6 Clicks" Daily Activity     Outcome Measure  Help from another person eating meals?: None Help from another person taking care of personal grooming?: A Little Help from another person toileting, which includes using toliet, bedpan, or urinal?: A Little Help from another person bathing (including washing, rinsing, drying)?: A Little Help from another person to put on and taking off regular upper body clothing?: A Little Help from another person to put on and taking off regular lower body clothing?: A Little 6 Click Score: 19   End of Session Equipment Utilized During Treatment: Gait belt;Rollator (4 wheels) Nurse Communication: Mobility status  Activity Tolerance: Patient tolerated treatment well Patient left: Other (comment) (left with PT)  OT Visit Diagnosis: Unsteadiness on feet (R26.81);Other abnormalities of gait and mobility (R26.89);Muscle weakness (generalized) (M62.81)                Time: 3086-5784 OT Time Calculation (min): 27 min Charges:  OT General Charges $OT Visit: 1 Visit OT Evaluation $OT Eval Low Complexity: 1 Low OT Treatments $Self Care/Home Management : 8-22 mins  Carlos, OTR/L   Alexis Goodell 07/15/2023, 11:38 AM

## 2023-07-15 NOTE — Discharge Summary (Signed)
Physician Discharge Summary  Tracy [name] UEA:[id number] DOB: 10-19-1945 DOA: 07/13/2023  PCP: [name], MD  Admit date: 07/13/2023 Discharge date: 07/15/2023  Admitted From: home Disposition:  home (refusing SNF)  Recommendations for Outpatient Follow-up:  Follow up with PCP in 1-2 weeks  Home Health: PT Equipment/Devices: walker, 3 in 1  Discharge Condition: stable CODE STATUS: Full code Diet Orders (From admission, onward)     Start     Ordered   07/13/23 2103  Diet regular Room service appropriate? Yes; Fluid consistency: Thin  Diet effective now       Question Answer Comment  Room service appropriate? Yes   Fluid consistency: Thin      07/13/23 2102            HPI: Per admitting MD, Tracy [name] is a 77 y.o. female with medical history significant of breast cancer, hypertension, hyperlipidemia, CAD, multiple myeloma who presents emergency department after syncopal episode.  Patient is been endorsing persistent chest pain and epigastrium for the last week.  She suddenly lost consciousness without any warning fell backwards on the callus without any trauma.  She presented to the ER due to her syncopal episode.  She was found to be afebrile and hemodynamically stable.  Initially she was hypertensive with systolics in 180s and was given hydralazine.  Her chest pain was reproducible to palpation.  LAbs were obtained which showed WBC 7.5, hemoglobin 9.9, creatinine 1.35, troponin 94, 81.  Patient had CT head which showed no acute intracranial abnormalities.  CTA chest abdomen pelvis showed no evidence of dissection. On bedside evaluation patient states that she has been having persistent chest pain for the last week that is reproducible palpation and positional.  No exertional component.  There was no preceding episodes were vasovagal syncope.  Hospital Course / Discharge diagnoses:   Principal problem Presyncopal episode, chest pain-patient was admitted to  the hospital with several days of constant epigastric/lower chest discomfort as well as presyncopal episode.  She is not sure whether she lost consciousness or not, but felt very lightheaded before this happened.  She does have a history of acute on chronic dizziness for which she is on meclizine already.  Underwent a 2D echo which showed normal LVEF 65 to 70%, no WMA, grade 1 diastolic dysfunction.  RV was normal.  She worked with therapy, SNF was recommended however she declines that and wants to go home instead.  Home health therapies were maximized   Active problems Dizziness -acute on chronic, continue home meclizine.  She underwent an MRI of the brain which is negative for acute findings  Chest pain-sounds MSK in nature, it is changing with changing position as well as reproducible to palpation in the lower chest area.  She had mild troponin elevation, flat, not in a pattern consistent with ACS.  Continue supportive care  Hyperlipidemia-resume home medications History of breast cancer, MM-follows with oncology as an outpatient CKD 3A-baseline creatinine 1.2-1.4, currently at baseline  Sepsis ruled out   Discharge Instructions   Allergies as of 07/15/2023       Reactions   Bacitracin-neomycin-polymyxin  [neomycin-bacitracin Zn-polymyx] Swelling   Nsaids Other (See Comments)   nosebleeds   Tape Hives   Pt cannot tolerate bandaids, tape, or any other adhesives.    Ambien [zolpidem Tartrate]    Hives   Amoxicillin    Rash   Clavulanic Acid Hives   Contrast Media [iodinated Contrast Media] Hives   Pt states hives with  prior ct, was given benadryl to resolve   Latex Swelling   Prednisone Swelling   Pt tolerates Dexamethasone. Throat swelling   Tessalon [benzonatate] Itching, Rash        Medication List     TAKE these medications    Accu-Chek Guide test strip Generic drug: glucose blood Use as instructed to check sugars 1-2 times daily.   acyclovir 400 MG  tablet Commonly known as: ZOVIRAX TAKE 1 TABLET BY MOUTH TWICE  DAILY   atorvastatin 10 MG tablet Commonly known as: LIPITOR TAKE 1 TABLET BY MOUTH DAILY   gabapentin 300 MG capsule Commonly known as: NEURONTIN Take 2 capsules (600 mg total) by mouth 3 (three) times daily.   HYDROcodone-acetaminophen 5-325 MG tablet Commonly known as: NORCO/VICODIN Take 1 tablet by mouth every 6 (six) hours as needed for moderate pain (pain score 4-6).   lidocaine 5 % Commonly known as: Lidoderm Place 1 patch onto the skin daily. Remove & Discard patch within 12 hours or as directed by MD What changed:  when to take this reasons to take this additional instructions   lidocaine-prilocaine cream Commonly known as: EMLA Apply 1 Application topically as needed (Apply 1 application to port site 1 hour prior to access as needed).   meclizine 25 MG tablet Commonly known as: ANTIVERT Take 1 tablet (25 mg total) by mouth 3 (three) times daily as needed for dizziness.   metFORMIN 500 MG tablet Commonly known as: GLUCOPHAGE TAKE 1 TABLET BY MOUTH  TWICE DAILY WITH A MEAL   ondansetron 8 MG tablet Commonly known as: ZOFRAN Take 1 tablet (8 mg total) by mouth every 8 (eight) hours as needed for nausea or vomiting.   pantoprazole 40 MG tablet Commonly known as: PROTONIX TAKE 1 TABLET BY MOUTH TWICE  DAILY   tiZANidine 4 MG tablet Commonly known as: ZANAFLEX TAKE 1 TABLET BY MOUTH AT  BEDTIME   traZODone 100 MG tablet Commonly known as: DESYREL TAKE 1 TABLET BY MOUTH AT  BEDTIME   Vitamin D (Ergocalciferol) 1.25 MG (50000 UNIT) Caps capsule Commonly known as: DRISDOL TAKE 1 CAPSULE BY MOUTH ONCE  WEEKLY               Durable Medical Equipment  (From admission, onward)           Start     Ordered   07/14/23 1605  For home use only DME 4 wheeled rolling walker with seat  Once       Question:  Patient needs a walker to treat with the following condition  Answer:  Gait  instability   07/14/23 1604   07/14/23 1539  For home use only DME Bedside commode  Once       Comments: Pt's fatigue, difficulty focusing her eyes, instability in sitting/standing, generalized weakness and decreased activity tolerance necessitate recommendation for bedside commode.  Question:  Patient needs a bedside commode to treat with the following condition  Answer:  Weakness   07/14/23 1543            Follow-up Information     [facility] Follow up.   Specialty: Home Health Services Why: Someone will call you to schedule first home visit. Contact information: [address] [place] [address] [phone]                 Consultations: none  Procedures/Studies:  MR BRAIN WO CONTRAST Result Date: 07/15/2023 CLINICAL DATA:  Neuro deficit, acute, stroke  suspected EXAM: MRI HEAD WITHOUT CONTRAST TECHNIQUE: Multiplanar, multiecho pulse sequences of the brain and surrounding structures were obtained without intravenous contrast. COMPARISON:  Head CT 07/13/2023, brain MR 08/13/2017 FINDINGS: Brain: Negative for acute infarct. No hemorrhage. No hydrocephalus. No extra-axial fluid collection. No mass effect. No mass lesion. Vascular: Normal flow voids. Skull and upper cervical spine: Normal marrow signal. Sinuses/Orbits: No middle ear or mastoid effusion. Paranasal sinuses are clear. Orbits unremarkable. Other: None. IMPRESSION: No acute intracranial process. Electronically Signed   By: Lorenza Cambridge M.D.   On: 07/15/2023 09:54   ECHOCARDIOGRAM COMPLETE Result Date: 07/14/2023    ECHOCARDIOGRAM REPORT   Patient Name:   LISSETTE SITTNER Date of Exam: 07/14/2023 Medical Rec #:  [id number]      Height:       60.0 in Accession #:    [id number]     Weight:       178.6 lb Date of Birth:  Mar 15, 1946      BSA:          1.779 m Patient Age:    30 years       BP:           113/39 mmHg Patient Gender: F              HR:           72 bpm. Exam Location:   Inpatient Procedure: 2D Echo, Cardiac Doppler and Color Doppler Indications:    syncope  History:        Patient has no prior history of Echocardiogram examinations.                 CAD, TIA; Risk Factors:Hypertension and Diabetes.  Sonographer:    Webb Laws Referring Phys: [id number] ROBERT DORRELL IMPRESSIONS  1. Left ventricular ejection fraction, by estimation, is 65 to 70%. The left ventricle has normal function. The left ventricle has no regional wall motion abnormalities. There is mild concentric left ventricular hypertrophy. Left ventricular diastolic parameters are consistent with Grade I diastolic dysfunction (impaired relaxation). The average left ventricular global longitudinal strain is -19.4 %. The global longitudinal strain is normal.  2. Right ventricular systolic function is normal. The right ventricular size is normal.  3. Left atrial size was mildly dilated.  4. The mitral valve is normal in structure. Trivial mitral valve regurgitation. No evidence of mitral stenosis. Moderate mitral annular calcification.  5. The aortic valve is normal in structure. Aortic valve regurgitation is not visualized. Aortic valve sclerosis/calcification is present, without any evidence of aortic stenosis.  6. The inferior vena cava is normal in size with greater than 50% respiratory variability, suggesting right atrial pressure of 3 mmHg. FINDINGS  Left Ventricle: Left ventricular ejection fraction, by estimation, is 65 to 70%. The left ventricle has normal function. The left ventricle has no regional wall motion abnormalities. The average left ventricular global longitudinal strain is -19.4 %. The global longitudinal strain is normal. The left ventricular internal cavity size was normal in size. There is mild concentric left ventricular hypertrophy. Left ventricular diastolic parameters are consistent with Grade I diastolic dysfunction (impaired relaxation). Right Ventricle: The right ventricular size is normal.  No increase in right ventricular wall thickness. Right ventricular systolic function is normal. Left Atrium: Left atrial size was mildly dilated. Right Atrium: Right atrial size was normal in size. Pericardium: There is no evidence of pericardial effusion. Mitral Valve: The mitral valve is normal in structure. Moderate mitral annular calcification. Trivial mitral valve  regurgitation. No evidence of mitral valve stenosis. Tricuspid Valve: The tricuspid valve is normal in structure. Tricuspid valve regurgitation is trivial. No evidence of tricuspid stenosis. Aortic Valve: The aortic valve is normal in structure. Aortic valve regurgitation is not visualized. Aortic valve sclerosis/calcification is present, without any evidence of aortic stenosis. Aortic valve mean gradient measures 8.8 mmHg. Aortic valve peak  gradient measures 19.6 mmHg. Aortic valve area, by VTI measures 2.06 cm. Pulmonic Valve: The pulmonic valve was normal in structure. Pulmonic valve regurgitation is trivial. No evidence of pulmonic stenosis. Aorta: The aortic root is normal in size and structure. Venous: The inferior vena cava is normal in size with greater than 50% respiratory variability, suggesting right atrial pressure of 3 mmHg. IAS/Shunts: No atrial level shunt detected by color flow Doppler.  LEFT VENTRICLE PLAX 2D LVIDd:         3.90 cm     Diastology LVIDs:         1.90 cm     LV e' medial:    8.27 cm/s LV PW:         1.10 cm     LV E/e' medial:  13.1 LV IVS:        1.00 cm     LV e' lateral:   8.38 cm/s LVOT diam:     1.90 cm     LV E/e' lateral: 12.9 LV SV:         75 LV SV Index:   42          2D Longitudinal Strain LVOT Area:     2.84 cm    2D Strain GLS Avg:     -19.4 %  LV Volumes (MOD) LV vol d, MOD A2C: 54.5 ml 3D Volume EF: LV vol d, MOD A4C: 61.2 ml 3D EF:        59 % LV vol s, MOD A2C: 13.6 ml LV EDV:       88 ml LV vol s, MOD A4C: 11.9 ml LV ESV:       35 ml LV SV MOD A2C:     40.9 ml LV SV:        52 ml LV SV MOD A4C:      61.2 ml LV SV MOD BP:      46.3 ml RIGHT VENTRICLE             IVC RV Basal diam:  2.60 cm     IVC diam: 1.60 cm RV S prime:     18.50 cm/s TAPSE (M-mode): 2.5 cm LEFT ATRIUM             Index        RIGHT ATRIUM           Index LA diam:        3.60 cm 2.02 cm/m   RA Area:     11.30 cm LA Vol (A2C):   27.3 ml 15.35 ml/m  RA Volume:   21.70 ml  12.20 ml/m LA Vol (A4C):   33.5 ml 18.83 ml/m LA Biplane Vol: 31.3 ml 17.60 ml/m  AORTIC VALVE AV Area (Vmax):    2.10 cm AV Area (Vmean):   2.17 cm AV Area (VTI):     2.06 cm AV Vmax:           221.60 cm/s AV Vmean:          136.000 cm/s AV VTI:  0.362 m AV Peak Grad:      19.6 mmHg AV Mean Grad:      8.8 mmHg LVOT Vmax:         164.00 cm/s LVOT Vmean:        104.000 cm/s LVOT VTI:          0.263 m LVOT/AV VTI ratio: 0.73  AORTA Ao Root diam: 2.50 cm Ao Asc diam:  2.90 cm MITRAL VALVE MV Area (PHT): 3.27 cm     SHUNTS MV Decel Time: 232 msec     Systemic VTI:  0.26 m MV E velocity: 108.00 cm/s  Systemic Diam: 1.90 cm MV A velocity: 177.00 cm/s MV E/A ratio:  0.61 Arvilla Meres MD Electronically signed by Arvilla Meres MD Signature Date/Time: 07/14/2023/11:08:02 AM    Final    CT Angio Chest/Abd/Pel for Dissection W and/or Wo Contrast Result Date: 07/13/2023 CLINICAL DATA:  Acute aortic syndrome (AAS) suspected. History of multiple myeloma EXAM: CT ANGIOGRAPHY CHEST, ABDOMEN AND PELVIS TECHNIQUE: Non-contrast CT of the chest was initially obtained. Multidetector CT imaging through the chest, abdomen and pelvis was performed using the standard protocol during bolus administration of intravenous contrast. Multiplanar reconstructed images and MIPs were obtained and reviewed to evaluate the vascular anatomy. RADIATION DOSE REDUCTION: This exam was performed according to the departmental dose-optimization program which includes automated exposure control, adjustment of the mA and/or kV according to patient size and/or use of iterative reconstruction  technique. CONTRAST:  75mL OMNIPAQUE IOHEXOL 350 MG/ML SOLN COMPARISON:  01/27/2023, 10/31/2020 FINDINGS: CTA CHEST FINDINGS Cardiovascular: Noncontrast CT of the chest demonstrates no evidence of a thoracic aortic intramural hematoma. Adequate opacification of the thoracic aorta. No thoracic aortic aneurysm or dissection. Three vessel arch with widely patent arch vessels. Aortic and coronary artery atherosclerosis. Central pulmonary vasculature is also well opacified. No central pulmonary arterial filling defects. Reflux of contrast into the IVC and hepatic veins. Heart size is normal. No significant pericardial effusion. Right IJ chest port terminates at the level of the mid SVC. Mediastinum/Nodes: Mildly enlarged subcarinal lymph nodes measuring up to 10 mm short axis (series 10, image 76). No axillary or hilar lymphadenopathy. Thyroid gland, trachea, and esophagus within normal limits. Lungs/Pleura: Mild emphysema. No focal airspace consolidation. No pleural effusion. No pneumothorax. Musculoskeletal: No chest wall abnormality. No acute or significant osseous findings. Review of the MIP images confirms the above findings. CTA ABDOMEN AND PELVIS FINDINGS VASCULAR Aorta: Normal caliber aorta without aneurysm, dissection, vasculitis or significant stenosis. Mild atherosclerosis. Celiac: Patent without evidence of aneurysm, dissection, vasculitis or significant stenosis. SMA: Patent without evidence of aneurysm, dissection, vasculitis or significant stenosis. Renals: Both renal arteries are patent without evidence of aneurysm, dissection, vasculitis, fibromuscular dysplasia or significant stenosis. IMA: Patent. Inflow: Patent without evidence of aneurysm, dissection, vasculitis or significant stenosis. Veins: No obvious venous abnormality within the limitations of this arterial phase study. Review of the MIP images confirms the above findings. NON-VASCULAR Hepatobiliary: No focal liver abnormality is seen. No  gallstones, gallbladder wall thickening, or biliary dilatation. Pancreas: Unremarkable. No pancreatic ductal dilatation or surrounding inflammatory changes. Spleen: Normal in size without focal abnormality. Adrenals/Urinary Tract: Adrenal glands are unremarkable. Kidneys are normal, without renal calculi, focal lesion, or hydronephrosis. Bladder is unremarkable. Stomach/Bowel: Stomach is within normal limits. Appendix appears normal. Colonic diverticulosis. No evidence of bowel wall thickening, distention, or inflammatory changes. Lymphatic: No abdominopelvic lymphadenopathy. Reproductive: Status post hysterectomy. No adnexal masses. Other: No ascites or pneumoperitoneum. Musculoskeletal: No significant change in appearance  of scattered lucent lesions including dominant lesions in the posterior left acetabulum and L5 vertebral body. No new lesions are identified. No pathologic fractures. Review of the MIP images confirms the above findings. IMPRESSION: 1. No evidence of acute aortic syndrome. 2. Aortic and coronary artery atherosclerosis (ICD10-I70.0). 3. Reflux of contrast into the IVC and hepatic veins, which can be seen in the setting of right heart dysfunction. 4. Mildly enlarged subcarinal lymph nodes, nonspecific. 5. Colonic diverticulosis without evidence of acute diverticulitis. 6. No significant change in appearance of scattered lucent lesions including dominant lesions in the posterior left acetabulum and L5 vertebral body. No new lesions are identified. No pathologic fractures. 7. Mild emphysema (ICD10-J43.9). Electronically Signed   By: Duanne Guess D.O.   On: 07/13/2023 17:19   CT HEAD WO CONTRAST ( ) Result Date: 07/13/2023 CLINICAL DATA:  Headache, increasing frequency or severity EXAM: CT HEAD WITHOUT CONTRAST TECHNIQUE: Contiguous axial images were obtained from the base of the skull through the vertex without intravenous contrast. RADIATION DOSE REDUCTION: This exam was performed  according to the departmental dose-optimization program which includes automated exposure control, adjustment of the mA and/or kV according to patient size and/or use of iterative reconstruction technique. COMPARISON:  CT head 03/17/2021. FINDINGS: Brain: No evidence of acute infarction, hemorrhage, hydrocephalus, extra-axial collection or mass lesion/mass effect. Partially empty sella. Vascular: No hyperdense vessel. Skull: Normal. Negative for fracture or focal lesion. Sinuses/Orbits: No acute finding. Other: No mastoid effusions. IMPRESSION: No evidence of acute intracranial abnormality. Electronically Signed   By: Feliberto Harts M.D.   On: 07/13/2023 16:44   DG Chest Port 1 View Result Date: 07/13/2023 CLINICAL DATA:  near syncope EXAM: PORTABLE CHEST - 1 VIEW COMPARISON:  03/16/2023. FINDINGS: Cardiac silhouette is unremarkable. No pneumothorax or pleural effusion. The lungs are clear. Aorta is calcified. There are thoracic degenerative changes. Right-sided Port-A-Cath tip overlies mid SVC. IMPRESSION: No acute cardiopulmonary process. Electronically Signed   By: Layla Maw M.D.   On: 07/13/2023 14:22     Subjective: - no chest pain, shortness of breath, no abdominal pain, nausea or vomiting.   Discharge Exam: BP (!) 109/46   Pulse 65   Temp 98.2 F (36.8 C)   Resp 20   Ht 5' (1.524 m)   Wt 81 kg   SpO2 97%   BMI 34.88 kg/m   General: Pt is alert, awake, not in acute distress Cardiovascular: RRR, S1/S2 +, no rubs, no gallops Respiratory: CTA bilaterally, no wheezing, no rhonchi Abdominal: Soft, NT, ND, bowel sounds + Extremities: no edema, no cyanosis    The results of significant diagnostics from this hospitalization (including imaging, microbiology, ancillary and laboratory) are listed below for reference.     Microbiology: Recent Results (from the past 240 hours)  Resp panel by RT-PCR (RSV, Flu A&B, Covid) Anterior Nasal Swab     Status: None   Collection Time:  07/13/23  2:33 PM   Specimen: Anterior Nasal Swab  Result Value Ref Range Status   SARS Coronavirus 2 by RT PCR NEGATIVE NEGATIVE Final   Influenza A by PCR NEGATIVE NEGATIVE Final   Influenza B by PCR NEGATIVE NEGATIVE Final    Comment: (NOTE) The Xpert Xpress SARS-CoV-2/FLU/RSV plus assay is intended as an aid in the diagnosis of influenza from Nasopharyngeal swab specimens and should not be used as a sole basis for treatment. Nasal washings and aspirates are unacceptable for Xpert Xpress SARS-CoV-2/FLU/RSV testing.  Fact Sheet for Patients: BloggerCourse.com  Fact Sheet for  Healthcare Providers: SeriousBroker.it  This test is not yet approved or cleared by the [place] and has been authorized for detection and/or diagnosis of SARS-CoV-2 by FDA under an Emergency Use Authorization (EUA). This EUA will remain in effect (meaning this test can be used) for the duration of the COVID-19 declaration under Section 564(b)(1) of the Act, 21 U.S.C. section 360bbb-3(b)(1), unless the authorization is terminated or revoked.     Resp Syncytial Virus by PCR NEGATIVE NEGATIVE Final    Comment: (NOTE) Fact Sheet for Patients: BloggerCourse.com  Fact Sheet for Healthcare Providers: SeriousBroker.it  This test is not yet approved or cleared by the [place] FDA and has been authorized for detection and/or diagnosis of SARS-CoV-2 by FDA under an Emergency Use Authorization (EUA). This EUA will remain in effect (meaning this test can be used) for the duration of the COVID-19 declaration under Section 564(b)(1) of the Act, 21 U.S.C. section 360bbb-3(b)(1), unless the authorization is terminated or revoked.  Performed at [facility], [address], [place], [place] [address]      Labs: Basic Metabolic Panel: Recent Labs  Lab 07/13/23 1511  NA 137  K 3.9  CL 104   CO2 22  GLUCOSE 112*  BUN 9  CREATININE 1.35*  CALCIUM 9.3   Liver Function Tests: Recent Labs  Lab 07/13/23 1511  AST 16  ALT 12  ALKPHOS 70  BILITOT 0.5  PROT 6.2*  ALBUMIN 3.2*   CBC: Recent Labs  Lab 07/13/23 1511  WBC 7.5  HGB 9.9*  HCT 30.1*  MCV 95.9  PLT 326   CBG: No results for input(s): "GLUCAP" in the last 168 hours. Hgb A1c No results for input(s): "HGBA1C" in the last 72 hours. Lipid Profile No results for input(s): "CHOL", "HDL", "LDLCALC", "TRIG", "CHOLHDL", "LDLDIRECT" in the last 72 hours. Thyroid function studies No results for input(s): "TSH", "T4TOTAL", "T3FREE", "THYROIDAB" in the last 72 hours.  Invalid input(s): "FREET3" Urinalysis    Component Value Date/Time   COLORURINE YELLOW 04/30/2021 2118   APPEARANCEUR CLEAR 04/30/2021 2118   LABSPEC 1.023 04/30/2021 2118   PHURINE 5.0 04/30/2021 2118   GLUCOSEU NEGATIVE 04/30/2021 2118   HGBUR NEGATIVE 04/30/2021 2118   BILIRUBINUR 1+ 06/07/2021 1516   KETONESUR TRACE (A) 04/30/2021 2118   PROTEINUR Negative 01/08/2023 1448   PROTEINUR TRACE (A) 04/30/2021 2118   UROBILINOGEN 0.2 01/08/2023 1448   UROBILINOGEN 0.2 04/30/2021 1343   NITRITE neg 06/07/2021 1516   NITRITE NEGATIVE 04/30/2021 2118   LEUKOCYTESUR Moderate (2+) (A) 01/08/2023 1448   LEUKOCYTESUR NEGATIVE 04/30/2021 2118    FURTHER DISCHARGE INSTRUCTIONS:   Get Medicines reviewed and adjusted: Please take all your medications with you for your next visit with your Primary MD   Laboratory/radiological data: Please request your Primary MD to go over all hospital tests and procedure/radiological results at the follow up, please ask your Primary MD to get all Hospital records sent to his/her office.   In some cases, they will be blood work, cultures and biopsy results pending at the time of your discharge. Please request that your primary care M.D. goes through all the records of your hospital data and follows up on these  results.   Also Note the following: If you experience worsening of your admission symptoms, develop shortness of breath, life threatening emergency, suicidal or homicidal thoughts you must seek medical attention immediately by calling 911 or calling your MD immediately  if symptoms less severe.   You must  read complete instructions/literature along with all the possible adverse reactions/side effects for all the Medicines you take and that have been prescribed to you. Take any new Medicines after you have completely understood and accpet all the possible adverse reactions/side effects.    Do not drive when taking Pain medications or sleeping medications (Benzodaizepines)   Do not take more than prescribed Pain, Sleep and Anxiety Medications. It is not advisable to combine anxiety,sleep and pain medications without talking with your primary care practitioner   Special Instructions: If you have smoked or chewed Tobacco  in the last 2 yrs please stop smoking, stop any regular Alcohol  and or any Recreational drug use.   Wear Seat belts while driving.   Please note: You were cared for by a hospitalist during your hospital stay. Once you are discharged, your primary care physician will handle any further medical issues. Please note that NO REFILLS for any discharge medications will be authorized once you are discharged, as it is imperative that you return to your primary care physician (or establish a relationship with a primary care physician if you do not have one) for your post hospital discharge needs so that they can reassess your need for medications and monitor your lab values.  Time coordinating discharge: 35 minutes  SIGNED:  Pamella Pert, MD, PhD 07/15/2023, 10:25 AM

## 2023-07-15 NOTE — Discharge Instructions (Signed)
Follow with [name], MD in 5-7 days  Please get a complete blood count and chemistry panel checked by your Primary MD at your next visit, and again as instructed by your Primary MD. Please get your medications reviewed and adjusted by your Primary MD.  Please request your Primary MD to go over all Hospital Tests and Procedure/Radiological results at the follow up, please get all Hospital records sent to your Prim MD by signing hospital release before you go home.  In some cases, there will be blood work, cultures and biopsy results pending at the time of your discharge. Please request that your primary care M.D. goes through all the records of your hospital data and follows up on these results.  If you had Pneumonia of Lung problems at the Hospital: Please get a 2 view Chest X ray done in 6-8 weeks after hospital discharge or sooner if instructed by your Primary MD.  If you have Congestive Heart Failure: Please call your Cardiologist or Primary MD anytime you have any of the following symptoms:  1) 3 pound weight gain in 24 hours or 5 pounds in 1 week  2) shortness of breath, with or without a dry hacking cough  3) swelling in the hands, feet or stomach  4) if you have to sleep on extra pillows at night in order to breathe  Follow cardiac low salt diet and 1.5 lit/day fluid restriction.  If you have diabetes Accuchecks 4 times/day, Once in AM empty stomach and then before each meal. Log in all results and show them to your primary doctor at your next visit. If any glucose reading is under 80 or above 300 call your primary MD immediately.  If you have Seizure/Convulsions/Epilepsy: Please do not drive, operate heavy machinery, participate in activities at heights or participate in high speed sports until you have seen by Primary MD or a Neurologist and advised to do so again. Per [facility] statutes, patients with seizures are not allowed to drive until they have been  seizure-free for six months.  Use caution when using heavy equipment or power tools. Avoid working on ladders or at heights. Take showers instead of baths. Ensure the water temperature is not too high on the home water heater. Do not go swimming alone. Do not lock yourself in a room alone (i.e. bathroom). When caring for infants or small children, sit down when holding, feeding, or changing them to minimize risk of injury to the child in the event you have a seizure. Maintain good sleep hygiene. Avoid alcohol.   If you had Gastrointestinal Bleeding: Please ask your Primary MD to check a complete blood count within one week of discharge or at your next visit. Your endoscopic/colonoscopic biopsies that are pending at the time of discharge, will also need to followed by your Primary MD.  Get Medicines reviewed and adjusted. Please take all your medications with you for your next visit with your Primary MD  Please request your Primary MD to go over all hospital tests and procedure/radiological results at the follow up, please ask your Primary MD to get all Hospital records sent to his/her office.  If you experience worsening of your admission symptoms, develop shortness of breath, life threatening emergency, suicidal or homicidal thoughts you must seek medical attention immediately by calling 911 or calling your MD immediately  if symptoms less severe.  You must read complete instructions/literature along with all the possible adverse reactions/side effects for all the Medicines you  take and that have been prescribed to you. Take any new Medicines after you have completely understood and accpet all the possible adverse reactions/side effects.   Do not drive or operate heavy machinery when taking Pain medications.   Do not take more than prescribed Pain, Sleep and Anxiety Medications  Special Instructions: If you have smoked or chewed Tobacco  in the last 2 yrs please stop smoking, stop any regular  Alcohol  and or any Recreational drug use.  Wear Seat belts while driving.  Please note You were cared for by a hospitalist during your hospital stay. If you have any questions about your discharge medications or the care you received while you were in the hospital after you are discharged, you can call the unit and asked to speak with the hospitalist on call if the hospitalist that took care of you is not available. Once you are discharged, your primary care physician will handle any further medical issues. Please note that NO REFILLS for any discharge medications will be authorized once you are discharged, as it is imperative that you return to your primary care physician (or establish a relationship with a primary care physician if you do not have one) for your aftercare needs so that they can reassess your need for medications and monitor your lab values.  You can reach the hospitalist office at phone [phone] or fax [phone]   If you do not have a primary care physician, you can call [phone] for a physician referral.  Activity: As tolerated with Full fall precautions use walker/cane & assistance as needed    Diet: regular  Disposition Home

## 2023-07-15 NOTE — Progress Notes (Addendum)
Transient hypotension: Received a call from patient nurse that patient blood pressure is of 81/46.  Patient is asymptomatic. Per chart review patient has been admitted for presyncope and chest pain so far no event on telemetry.   -Giving 1 L of NS bolus.  Will monitor improvement of blood pressure afterward.  Addendum: -Blood pressure has been improved to 109/46.  Still diastolic blood pressure is in the lower range.  As IV access has been lost unable to complete the 1 L of NS bolus.  Tereasa Coop, MD [facility] 07/15/2023, 5:56 AM

## 2023-07-16 ENCOUNTER — Telehealth: Payer: Self-pay

## 2023-07-16 ENCOUNTER — Other Ambulatory Visit: Payer: Self-pay

## 2023-07-16 ENCOUNTER — Telehealth: Payer: Self-pay | Admitting: *Deleted

## 2023-07-16 DIAGNOSIS — N1831 Chronic kidney disease, stage 3a: Secondary | ICD-10-CM | POA: Diagnosis not present

## 2023-07-16 DIAGNOSIS — C7951 Secondary malignant neoplasm of bone: Secondary | ICD-10-CM

## 2023-07-16 DIAGNOSIS — C9000 Multiple myeloma not having achieved remission: Secondary | ICD-10-CM

## 2023-07-16 DIAGNOSIS — I700 Atherosclerosis of aorta: Secondary | ICD-10-CM | POA: Diagnosis not present

## 2023-07-16 DIAGNOSIS — E1122 Type 2 diabetes mellitus with diabetic chronic kidney disease: Secondary | ICD-10-CM | POA: Diagnosis not present

## 2023-07-16 DIAGNOSIS — I088 Other rheumatic multiple valve diseases: Secondary | ICD-10-CM | POA: Diagnosis not present

## 2023-07-16 DIAGNOSIS — I10 Essential (primary) hypertension: Secondary | ICD-10-CM

## 2023-07-16 DIAGNOSIS — I1310 Hypertensive heart and chronic kidney disease without heart failure, with stage 1 through stage 4 chronic kidney disease, or unspecified chronic kidney disease: Secondary | ICD-10-CM | POA: Diagnosis not present

## 2023-07-16 DIAGNOSIS — I2510 Atherosclerotic heart disease of native coronary artery without angina pectoris: Secondary | ICD-10-CM | POA: Diagnosis not present

## 2023-07-16 NOTE — Progress Notes (Signed)
Complex Care Management  Outreach Note  07/16/2023 Name: Tracy [name] MRN: [id number] DOB: January 17, 1946   Complex Care Management Outreach Attempts: An unsuccessful telephone outreach was attempted today to offer the patient information about available complex care management services.  Follow Up Plan:  Additional outreach attempts will be made to offer the patient complex care management information and services.   Encounter Outcome:  No Answer  [facility]  Population Health Careguide  Direct Dial: [phone] Website: [email or web address]

## 2023-07-16 NOTE — Transitions of Care (Post Inpatient/ED Visit) (Signed)
07/16/2023  Name: Tracy [name] MRN: [id number] DOB: [birth year]  Today's TOC FU Call Status: Today's TOC FU Call Status:: Successful TOC FU Call Completed TOC FU Call Complete Date: 07/16/23 Patient's Name and Date of Birth confirmed.  Transition Care Management Follow-up Telephone Call Date of Discharge: 07/15/23 Discharge Facility: [facility]) Type of Discharge: Inpatient Admission (noted during call that pt was "observation status") Primary Inpatient Discharge Diagnosis:: "near syncope" How have you been since you were released from the hospital?: Same (Pt states" slept pretty good-took sleeping pill, she is still having same pain to top of abd to left breast/side/back area that she was having in hospital-taking pain med- 5/10 at present-took med an hr ago-med does help, appetite improving, no N&V) Any questions or concerns?: Yes Patient Questions/Concerns:: Pt voices he was not able to get the cause of her pain from medical team that cared for hrr in the hospital.States she wants to follow up with PCP so she can further evaluate her and determine cause. She confirms that pain med is not any worse and that pain med is helping relieve pain and makes pain "dull instead of sharp" but wants to know what the cause is. Patient Questions/Concerns Addressed: Other:, Notified Provider of Patient Questions/Concerns (Pain mgmt discussed with pt- care guide assisted while on call with getting pt a follow up appt with provider per her request on 07/22/23)  Items Reviewed: Did you receive and understand the discharge instructions provided?: Yes Medications obtained,verified, and reconciled?: Yes (Medications Reviewed) Any new allergies since your discharge?: No Dietary orders reviewed?: Yes Type of Diet Ordered:: low salt/heart healthy/carb modified Do you have support at home?: Yes People in Home: alone Name of Support/Comfort Primary Source: pt voices she has friends, a Network engineer and church  members that assist her as needed  Medications Reviewed Today: Medications Reviewed Today     Reviewed by Charlyn Minerva, RN (Registered Nurse) on 07/16/23 at 1305  Med List Status: <None>   Medication Order Taking? Sig Documenting Provider Last Dose Status Informant  acyclovir (ZOVIRAX) 400 MG tablet [id number] Yes TAKE 1 TABLET BY MOUTH TWICE  DAILY Johney Maine, MD 07/16/2023 Active Self  atorvastatin (LIPITOR) 10 MG tablet [id number] Yes TAKE 1 TABLET BY MOUTH DAILY [name], MD Taking Active Self  gabapentin (NEURONTIN) 300 MG capsule [id number] Yes Take 2 capsules (600 mg total) by mouth 3 (three) times daily. [name], MD 07/16/2023 Active Self  glucose blood (ACCU-CHEK GUIDE) test strip [id number]  Use as instructed to check sugars 1-2 times daily. [name], MD  Active Self  HYDROcodone-acetaminophen (NORCO/VICODIN) 5-325 MG tablet [id number] Yes Take 1 tablet by mouth every 6 (six) hours as needed for moderate pain (pain score 4-6). Leatha Gilding, MD Taking Active   lidocaine (LIDODERM) 5 % [id number] Yes Place 1 patch onto the skin daily. Remove & Discard patch within 12 hours or as directed by MD  Patient taking differently: Place 1 patch onto the skin daily as needed (for pain).   Johney Maine, MD Taking Active Self  lidocaine-prilocaine (EMLA) cream [id number] Yes Apply 1 Application topically as needed (Apply 1 application to port site 1 hour prior to access as needed). Johney Maine, MD Taking Active Self  meclizine (ANTIVERT) 25 MG tablet [id number] Yes Take 1 tablet (25 mg total) by mouth 3 (three) times daily as needed for dizziness. Loa Socks, NP Taking Active Self  Med Note (SATTERFIELD, Genoveva Ill   Sun Jul 13, 2023  7:57 PM) Still has on hand   metFORMIN (GLUCOPHAGE) 500 MG tablet [id number] Yes TAKE 1 TABLET BY MOUTH  TWICE DAILY WITH A MEAL [name], MD 07/16/2023 Active Self   ondansetron (ZOFRAN) 8 MG tablet [id number] Yes Take 1 tablet (8 mg total) by mouth every 8 (eight) hours as needed for nausea or vomiting. Briant Cedar, PA-C Taking Active Self  pantoprazole (PROTONIX) 40 MG tablet [id number] Yes TAKE 1 TABLET BY MOUTH TWICE  DAILY [name], MD 07/16/2023 Active Self  tiZANidine (ZANAFLEX) 4 MG tablet [id number] Yes TAKE 1 TABLET BY MOUTH AT  BEDTIME [name], MD 07/15/2023 Active Self  traZODone (DESYREL) 100 MG tablet [id number] Yes TAKE 1 TABLET BY MOUTH AT  BEDTIME [name], MD 07/15/2023 Active Self  Vitamin D, Ergocalciferol, (DRISDOL) 1.25 MG (50000 UNIT) CAPS capsule [id number] Yes TAKE 1 CAPSULE BY MOUTH ONCE  WEEKLY Johney Maine, MD Taking Active Self           Med Note (SATTERFIELD, Jaynie Crumble Jul 13, 2023  7:57 PM) Take on Sundays             Home Care and Equipment/Supplies: Were Home Health Services Ordered?: Yes Name of Home Health Agency:: [facility]-PT,OT,RN Has Agency set up a time to come to your home?: Yes First Home Health Visit Date: 07/16/23 (pt confirms that nurse was out earlier today to see her and therapist is coming tomorrow) Any new equipment or medical supplies ordered?: Yes Name of Medical supply agency?: [employer], [facility] Were you able to get the equipment/medical supplies?: Yes Do you have any questions related to the use of the equipment/supplies?: No  Functional Questionnaire: Do you need assistance with bathing/showering or dressing?: No Do you need assistance with meal preparation?: No Do you need assistance with eating?: No Do you have difficulty maintaining continence: No Do you need assistance with getting out of bed/getting out of a chair/moving?: No Do you have difficulty managing or taking your medications?: No  Follow up appointments reviewed: PCP Follow-up appointment confirmed?: Yes Date of PCP follow-up appointment?: 07/22/23 Follow-up  Provider: Dr. Beverely Low Specialist [facility] Follow-up appointment confirmed?: Yes Date of Specialist follow-up appointment?: 07/18/23 Follow-Up Specialty Provider:: Dr. Kale(oncology) Do you need transportation to your follow-up appointment?: No (pt confirms that her church member or friend will be able to take her to appts) Do you understand care options if your condition(s) worsen?: Yes-patient verbalized understanding  SDOH Interventions Today    Flowsheet Row Most Recent Value  SDOH Interventions   Food Insecurity Interventions AMB Referral  [pt wanting to get some food assistance while recovering from hospitalization-states she called insurance meal service-[employer] and was told she does not qualify as she was "obs' status -agreeable to referral for possible assistance]  Housing Interventions Intervention Not Indicated  Transportation Interventions Patient Declined  [pt declined-states she has friend or church members who take her to appts]  Utilities Interventions Intervention Not Indicated      Interventions Today    Flowsheet Row Most Recent Value  Chronic Disease   Chronic disease during today's visit Hypertension (HTN), Other, Chronic Kidney Disease/End Stage Renal Disease (ESRD)  [place]]  General Interventions   General Interventions Discussed/Reviewed General Interventions Discussed, Doctor Visits, [facility], Horticulturist, commercial (DME)  Freada Bergeron placed for possible Meals on Wheel]  Doctor Visits Discussed/Reviewed Doctor Visits Discussed, PCP, Specialist  Durable Medical Equipment (DME) Glucomoter  [pt voices she has cbg meter in the home but does not monitor or check cbgs and that MD is aware]  PCP/Specialist Visits Compliance with follow-up visit  Education Interventions   Education Provided Provided Education  Provided Verbal Education On Nutrition, When to see the doctor, Medication, Other  [pain/sx mgmt]  Mental Health Interventions   Mental Health  Discussed/Reviewed Refer to Social Work for resources  Refer to Social Work for resources regarding Other  [meal assistance]  Nutrition Interventions   Nutrition Discussed/Reviewed Nutrition Discussed  Pharmacy Interventions   Pharmacy Dicussed/Reviewed Pharmacy Topics Discussed, Medications and their functions  Safety Interventions   Safety Discussed/Reviewed Safety Discussed, Home Safety  Home Safety Assistive Devices       Antionette Fairy, RN,BSN,CCM RN Care Manager Transitions of Care  [place]/Population Health  Direct Phone: [phone] Toll Free: [phone] Fax: [phone]

## 2023-07-16 NOTE — Progress Notes (Signed)
  Care Coordination Note  07/16/2023 Name: CESAR MUSACCHIA MRN: [id number] DOB: 1945/08/12  Real Cons Warmuth is a 77 y.o. year old female who is a primary care patient of Beverely Low, Helane Rima, MD and is actively engaged with the care management team. I reached out to [employer] by phone today to assist with re-scheduling a follow up visit with the Licensed Clinical Social Worker  Follow up plan: Telephone appointment with care management team member scheduled for:07/29/23  [facility]  Care Coordination Care Guide  Direct Dial: [phone]

## 2023-07-17 ENCOUNTER — Other Ambulatory Visit: Payer: Self-pay

## 2023-07-17 ENCOUNTER — Encounter: Payer: Medicare Other | Admitting: Licensed Clinical Social Worker

## 2023-07-17 ENCOUNTER — Telehealth: Payer: Self-pay | Admitting: *Deleted

## 2023-07-17 ENCOUNTER — Telehealth: Payer: Self-pay | Admitting: Family Medicine

## 2023-07-17 DIAGNOSIS — I088 Other rheumatic multiple valve diseases: Secondary | ICD-10-CM | POA: Diagnosis not present

## 2023-07-17 DIAGNOSIS — N1831 Chronic kidney disease, stage 3a: Secondary | ICD-10-CM | POA: Diagnosis not present

## 2023-07-17 DIAGNOSIS — I700 Atherosclerosis of aorta: Secondary | ICD-10-CM | POA: Diagnosis not present

## 2023-07-17 DIAGNOSIS — I1310 Hypertensive heart and chronic kidney disease without heart failure, with stage 1 through stage 4 chronic kidney disease, or unspecified chronic kidney disease: Secondary | ICD-10-CM | POA: Diagnosis not present

## 2023-07-17 DIAGNOSIS — I2510 Atherosclerotic heart disease of native coronary artery without angina pectoris: Secondary | ICD-10-CM | POA: Diagnosis not present

## 2023-07-17 DIAGNOSIS — E1122 Type 2 diabetes mellitus with diabetic chronic kidney disease: Secondary | ICD-10-CM | POA: Diagnosis not present

## 2023-07-17 NOTE — Progress Notes (Signed)
Complex Care Management Note   07/17/2023 Name: Tracy [name] MRN: [id number] DOB: [birth year]  Real Cons [name] is a 77 y.o. year old female who sees [name], Helane Rima, MD for primary care. I reached out to [employer] by phone today to offer complex care management services.  [name] was given information about Complex Care Management services today including:   The Complex Care Management services include support from the care team which includes your Nurse Coordinator, Clinical Social Worker, or Pharmacist.  The Complex Care Management team is here to help remove barriers to the health concerns and goals most important to you. Complex Care Management services are voluntary, and the patient may decline or stop services at any time by request to their care team member.   Complex Care Management Consent Status: Patient agreed to services and verbal consent obtained.   Follow up plan:  Telephone appointment with complex care management team member scheduled for:  07/22/2023  Encounter Outcome:  Patient Scheduled  Burman Nieves, [facility] Care Coordination Care Guide Direct Dial: [phone]

## 2023-07-17 NOTE — Telephone Encounter (Signed)
Please advise 

## 2023-07-17 NOTE — Telephone Encounter (Signed)
Ok to provide verbal orders

## 2023-07-17 NOTE — Telephone Encounter (Addendum)
Home Health Verbal Orders  Agency:  [facility]  Caller: (Contact and title) Liji Call back #: [phone] Fax #:    Requesting OT/ PT/ Skilled nursing/ Social Work/ Speech:  Physical Therapy & Order for social worker  Reason for Request:  To work on Photographer. They are also requesting an order for social worker for finding community rescources.  Frequency:  Once a week for 3 weeks, then 2x a week for two weeks, then once for one week.      HH needs F2F w/in last 30 days

## 2023-07-17 NOTE — Telephone Encounter (Signed)
Called left vm to call office back . Verbal order ok per Dr.[name]

## 2023-07-18 ENCOUNTER — Telehealth: Payer: Self-pay | Admitting: *Deleted

## 2023-07-18 ENCOUNTER — Inpatient Hospital Stay: Payer: Medicare Other

## 2023-07-18 ENCOUNTER — Inpatient Hospital Stay (HOSPITAL_BASED_OUTPATIENT_CLINIC_OR_DEPARTMENT_OTHER): Payer: Medicare Other | Admitting: Hematology

## 2023-07-18 VITALS — BP 173/75 | HR 74 | Temp 99.0°F | Resp 18

## 2023-07-18 VITALS — BP 161/63 | HR 69 | Temp 97.0°F | Resp 20 | Wt 187.2 lb

## 2023-07-18 DIAGNOSIS — C7951 Secondary malignant neoplasm of bone: Secondary | ICD-10-CM

## 2023-07-18 DIAGNOSIS — N189 Chronic kidney disease, unspecified: Secondary | ICD-10-CM | POA: Diagnosis not present

## 2023-07-18 DIAGNOSIS — E538 Deficiency of other specified B group vitamins: Secondary | ICD-10-CM | POA: Diagnosis not present

## 2023-07-18 DIAGNOSIS — Z7189 Other specified counseling: Secondary | ICD-10-CM | POA: Diagnosis not present

## 2023-07-18 DIAGNOSIS — Z853 Personal history of malignant neoplasm of breast: Secondary | ICD-10-CM | POA: Diagnosis not present

## 2023-07-18 DIAGNOSIS — C9000 Multiple myeloma not having achieved remission: Secondary | ICD-10-CM

## 2023-07-18 DIAGNOSIS — E1122 Type 2 diabetes mellitus with diabetic chronic kidney disease: Secondary | ICD-10-CM | POA: Diagnosis not present

## 2023-07-18 DIAGNOSIS — R110 Nausea: Secondary | ICD-10-CM | POA: Diagnosis not present

## 2023-07-18 DIAGNOSIS — Z79899 Other long term (current) drug therapy: Secondary | ICD-10-CM | POA: Diagnosis not present

## 2023-07-18 DIAGNOSIS — G4733 Obstructive sleep apnea (adult) (pediatric): Secondary | ICD-10-CM | POA: Diagnosis not present

## 2023-07-18 DIAGNOSIS — I129 Hypertensive chronic kidney disease with stage 1 through stage 4 chronic kidney disease, or unspecified chronic kidney disease: Secondary | ICD-10-CM | POA: Diagnosis not present

## 2023-07-18 DIAGNOSIS — I2510 Atherosclerotic heart disease of native coronary artery without angina pectoris: Secondary | ICD-10-CM | POA: Diagnosis not present

## 2023-07-18 DIAGNOSIS — G629 Polyneuropathy, unspecified: Secondary | ICD-10-CM | POA: Diagnosis not present

## 2023-07-18 DIAGNOSIS — Z803 Family history of malignant neoplasm of breast: Secondary | ICD-10-CM | POA: Diagnosis not present

## 2023-07-18 DIAGNOSIS — Z87891 Personal history of nicotine dependence: Secondary | ICD-10-CM | POA: Diagnosis not present

## 2023-07-18 DIAGNOSIS — E1140 Type 2 diabetes mellitus with diabetic neuropathy, unspecified: Secondary | ICD-10-CM | POA: Diagnosis not present

## 2023-07-18 DIAGNOSIS — D509 Iron deficiency anemia, unspecified: Secondary | ICD-10-CM | POA: Diagnosis not present

## 2023-07-18 DIAGNOSIS — G2581 Restless legs syndrome: Secondary | ICD-10-CM | POA: Diagnosis not present

## 2023-07-18 DIAGNOSIS — Z95828 Presence of other vascular implants and grafts: Secondary | ICD-10-CM

## 2023-07-18 DIAGNOSIS — Z8673 Personal history of transient ischemic attack (TIA), and cerebral infarction without residual deficits: Secondary | ICD-10-CM | POA: Diagnosis not present

## 2023-07-18 DIAGNOSIS — Z801 Family history of malignant neoplasm of trachea, bronchus and lung: Secondary | ICD-10-CM | POA: Diagnosis not present

## 2023-07-18 DIAGNOSIS — I252 Old myocardial infarction: Secondary | ICD-10-CM | POA: Diagnosis not present

## 2023-07-18 DIAGNOSIS — Z800 Family history of malignant neoplasm of digestive organs: Secondary | ICD-10-CM | POA: Diagnosis not present

## 2023-07-18 DIAGNOSIS — Z923 Personal history of irradiation: Secondary | ICD-10-CM | POA: Diagnosis not present

## 2023-07-18 LAB — CBC WITH DIFFERENTIAL (CANCER CENTER ONLY)
Abs Immature Granulocytes: 0.02 10*3/uL (ref 0.00–0.07)
Basophils Absolute: 0 10*3/uL (ref 0.0–0.1)
Basophils Relative: 0 %
Eosinophils Absolute: 0.1 10*3/uL (ref 0.0–0.5)
Eosinophils Relative: 1 %
HCT: 28.6 % — ABNORMAL LOW (ref 36.0–46.0)
Hemoglobin: 9.6 g/dL — ABNORMAL LOW (ref 12.0–15.0)
Immature Granulocytes: 0 %
Lymphocytes Relative: 36 %
Lymphs Abs: 2.3 10*3/uL (ref 0.7–4.0)
MCH: 31.7 pg (ref 26.0–34.0)
MCHC: 33.6 g/dL (ref 30.0–36.0)
MCV: 94.4 fL (ref 80.0–100.0)
Monocytes Absolute: 0.5 10*3/uL (ref 0.1–1.0)
Monocytes Relative: 8 %
Neutro Abs: 3.5 10*3/uL (ref 1.7–7.7)
Neutrophils Relative %: 55 %
Platelet Count: 334 10*3/uL (ref 150–400)
RBC: 3.03 MIL/uL — ABNORMAL LOW (ref 3.87–5.11)
RDW: 13 % (ref 11.5–15.5)
WBC Count: 6.4 10*3/uL (ref 4.0–10.5)
nRBC: 0 % (ref 0.0–0.2)

## 2023-07-18 LAB — CMP (CANCER CENTER ONLY)
ALT: 10 U/L (ref 0–44)
AST: 11 U/L — ABNORMAL LOW (ref 15–41)
Albumin: 4 g/dL (ref 3.5–5.0)
Alkaline Phosphatase: 71 U/L (ref 38–126)
Anion gap: 9 (ref 5–15)
BUN: 12 mg/dL (ref 8–23)
CO2: 26 mmol/L (ref 22–32)
Calcium: 9 mg/dL (ref 8.9–10.3)
Chloride: 107 mmol/L (ref 98–111)
Creatinine: 1.24 mg/dL — ABNORMAL HIGH (ref 0.44–1.00)
GFR, Estimated: 44 mL/min — ABNORMAL LOW (ref >60)
Glucose, Bld: 117 mg/dL — ABNORMAL HIGH (ref 70–99)
Potassium: 3.8 mmol/L (ref 3.5–5.1)
Sodium: 142 mmol/L (ref 135–145)
Total Bilirubin: 0.3 mg/dL (ref <1.2)
Total Protein: 6.1 g/dL — ABNORMAL LOW (ref 6.5–8.1)

## 2023-07-18 MED ORDER — HEPARIN SOD (PORK) LOCK FLUSH 100 UNIT/ML IV SOLN
500.0000 [IU] | Freq: Once | INTRAVENOUS | Status: AC | PRN
Start: 2023-07-18 — End: 2023-07-18
  Administered 2023-07-18: 500 [IU]

## 2023-07-18 MED ORDER — SODIUM CHLORIDE 0.9% FLUSH
10.0000 mL | INTRAVENOUS | Status: DC | PRN
  Administered 2023-07-18: 10 mL

## 2023-07-18 MED ORDER — ACETAMINOPHEN 325 MG PO TABS
650.0000 mg | ORAL_TABLET | Freq: Once | ORAL | Status: AC
  Administered 2023-07-18: 650 mg via ORAL
  Filled 2023-07-18: qty 2

## 2023-07-18 MED ORDER — SODIUM CHLORIDE 0.9 % IV SOLN
16.0000 mg/kg | Freq: Once | INTRAVENOUS | Status: AC
  Administered 2023-07-18: 1300 mg via INTRAVENOUS
  Filled 2023-07-18: qty 5

## 2023-07-18 MED ORDER — DIPHENHYDRAMINE HCL 25 MG PO CAPS
50.0000 mg | ORAL_CAPSULE | Freq: Once | ORAL | Status: AC
  Administered 2023-07-18: 50 mg via ORAL
  Filled 2023-07-18: qty 2

## 2023-07-18 MED ORDER — FAMOTIDINE IN NACL 20-0.9 MG/50ML-% IV SOLN
20.0000 mg | Freq: Once | INTRAVENOUS | Status: AC
Start: 2023-07-18 — End: 2023-07-18
  Administered 2023-07-18: 20 mg via INTRAVENOUS
  Filled 2023-07-18: qty 50

## 2023-07-18 MED ORDER — SODIUM CHLORIDE 0.9 % IV SOLN
Freq: Once | INTRAVENOUS | Status: AC
Start: 2023-07-18 — End: 2023-07-18

## 2023-07-18 MED ORDER — SODIUM CHLORIDE 0.9 % IV SOLN
16.0000 mg | Freq: Once | INTRAVENOUS | Status: AC
  Administered 2023-07-18: 16 mg via INTRAVENOUS
  Filled 2023-07-18: qty 1.6

## 2023-07-18 MED ORDER — SODIUM CHLORIDE 0.9% FLUSH: 3.0000 mL | Freq: Once | INTRAVENOUS | Status: DC | PRN

## 2023-07-18 MED ORDER — CYANOCOBALAMIN 1000 MCG/ML IJ SOLN
1000.0000 ug | Freq: Once | INTRAMUSCULAR | Status: AC
  Administered 2023-07-18: 1000 ug via INTRAMUSCULAR
  Filled 2023-07-18: qty 1

## 2023-07-18 MED ORDER — SODIUM CHLORIDE 0.9% FLUSH
10.0000 mL | Freq: Once | INTRAVENOUS | Status: AC
Start: 2023-07-18 — End: 2023-07-18
  Administered 2023-07-18: 10 mL

## 2023-07-18 NOTE — Progress Notes (Signed)
[facility] Follow up:   Date of Service: 07/18/23   Tracy Hatch, MD [address] [place] [address]  CC: Follow-up for continued evaluation and management of multiple myeloma  SUMMARY OF ONCOLOGIC HISTORY: Oncology History  History of right breast cancer  04/16/2000 Surgery   Left breast: Triple negative  invasive ductal cancer treated with lumpectomy, adjuvant chemotherapy, radiation , in [place], unknown stage   06/07/2015 Mammogram   Right breast mass 6x 6 x 5 mm, right axillary lymph node with slight cortex thickening measured 5 mm    06/13/2015 Initial Diagnosis   Right breast needle biopsy: Invasive ductal carcinoma, grade 1, right axillary lymph node biopsy negative , ER 95%, PR 5%, Ki-67 10%, HER-2 negative   06/13/2015 Clinical Stage   Stage IA: T1b N0   07/07/2015 Surgery   Right lumpectomy: Invasive ductal carcinoma grade 1, 1 cm span, with low-grade DCIS, DCIS focally 0.1 cm to inferior margin, 0/3 lymph nodes negative, ER 95%, PR 5%, HER-2 negative ratio 1.1, Ki-67 10%   07/07/2015 Pathologic Stage   Stage IA: T1c N0   07/13/2015 Procedure   Breast High/Moderate Risk Panel reveals no clinically significant variant at ATM, BRCA1, BRCA2, CDH1, CHEK2, PALB2, PTEN, and TP53.     08/23/2015 - 09/21/2015 Radiation Therapy   Adjuvant Radiation: Right breast/ 42.5Gy at 2.5 Gy per fraction x 17 fractions.   Right breast boost/ 7.5 Gy at 2.5 Gy per fraction x 3 fractions    Anti-estrogen oral therapy   Patient refused antiestrogen therapy   10/20/2015 Survivorship   Survivorship care plan completed and mailed to patient in lieu of in person visit at her request   Iron deficiency anemia  Multiple myeloma (HCC)  01/25/2020 Initial Diagnosis   Multiple myeloma (HCC)   02/11/2020 - 05/19/2020 Adjuvant Chemotherapy   Daratumumab Weekly    02/29/2020 - 03/13/2020 Radiation Therapy   The targets were treated to a total dose of 20 Gy in 10  fractions of 2 Gy each to the left hip and L5 using one plan.   06/02/2020 - 10/05/2020 Adjuvant Chemotherapy   Daratumumab and Carfilzomib   10/19/2020 -  Adjuvant Chemotherapy   Maintenance Daratumumab--initially every 2 weeks, then every 4 weeks.    05/24/2022 -  Chemotherapy   Patient is on Treatment Plan : MYELOMA Daratumumab IV q28d     Malignant neoplasm metastatic to bone (HCC)  02/08/2021 Initial Diagnosis   Bone metastases (HCC)   05/24/2022 -  Chemotherapy   Patient is on Treatment Plan : MYELOMA Daratumumab IV q28d       CURRENT THERAPY: Daratumumab/B12 injection   INTERVAL HISTORY:  Tracy [name] is a 77 y.o. female here for continued evaluation and management of multiple myeloma.   Patient was last seen by me on 06/20/2023 and she complained of knee pain, nausea, and dental/jaw issues.  Patient notes she has been doing fairly well since our last visit. She was recently admitted to the ED due to chest pain. She had lab workups and other scans, which did not show any concerns for a stroke.   During this visit, she notes that her chest pain has slightly improved since discharge. She describes the pain as "dull ache" which has been causing her back pain as well. She does note mild concerns for acid reflux. She regularly takes Omprezole.   Patient notes that her chest pain is tender to touch and it does not  improve or worsen with food. She has an appointment with her PCP this upcoming Tuesday, 07/22/2023.   She denies any new infection issues, fever, chills, night sweats, abdominal pain, abnormal bowel movement, unexpected weight loss, or leg swelling.    Patient Active Problem List   Diagnosis Date Noted   Syncope 07/13/2023   Syncope and collapse 07/13/2023   RLS (restless legs syndrome) 06/12/2022   Malignant neoplasm metastatic to bone (HCC) 02/08/2021   Angiodysplasia of intestine 08/23/2020   Port-A-Cath in place 08/04/2020   Counseling regarding advance care  planning and goals of care 02/07/2020   Multiple myeloma (HCC) 01/25/2020   Dizziness 10/12/2019   Post-nasal drainage 10/12/2019   Allergic rhinitis 08/19/2018   Type 2 diabetes mellitus with diabetic neuropathy, unspecified (HCC) 11/05/2017   Encounter for long-term use of muscle relaxants 09/24/2017   CAD in native artery 09/24/2017   OSA (obstructive sleep apnea) 09/24/2017   Snorings 09/24/2017   Asthenia 06/30/2017   Morbid obesity (HCC) 06/02/2017   Depression 06/02/2017   Iron deficiency anemia 09/23/2016   Anemia of chronic disease 09/28/2015   Genetic testing 08/21/2015   History of left breast cancer 07/13/2015   History of right breast cancer 06/20/2015   Hearing loss due to cerumen impaction 12/29/2014   Allergy to adhesive tape 05/19/2014   Hyperlipidemia 12/06/2013   GERD (gastroesophageal reflux disease) 12/06/2013   Cervical disc disease 11/11/2013   Osteopenia 05/25/2013   Allergic asthma 12/24/2012   Insomnia 04/02/2012   Physical exam, annual 04/02/2012   HTN (hypertension) 02/03/2012   Vertigo, benign positional 02/03/2012   Left groin pain 02/03/2012   Hip pain 10/10/2011   TIA (transient ischemic attack) 07/24/2011   Allergic reaction 07/05/2011   Osteoarthrosis involving lower leg 10/19/2010   Disorder of bone and cartilage 05/01/2010   Generalized anxiety disorder 05/01/2010   Vitamin D deficiency 02/20/2010   Unspecified chronic bronchitis (HCC) 08/24/2009   Other lymphedema 10/29/2007    is allergic to bacitracin-neomycin-polymyxin  [neomycin-bacitracin zn-polymyx], nsaids, tape, ambien [zolpidem tartrate], amoxicillin, clavulanic acid, contrast media [iodinated contrast media], latex, prednisone, and tessalon [benzonatate].  MEDICAL HISTORY: Past Medical History:  Diagnosis Date   Angiodysplasia of intestine 08/23/2020   Anxiety    Arthritis    Breast cancer (HCC) 06/13/15   Cancer (HCC) 2000   breast cancer   Chronic bronchitis (HCC)     Chronic bronchitis (HCC)    Hyperlipidemia    Hypertension    Myocardial infarction [facility]) 2001   Personal history of radiation therapy    Restless leg    Stroke (HCC) 2004   TIA, no deficits    SURGICAL HISTORY: Past Surgical History:  Procedure Laterality Date   ABDOMINAL HYSTERECTOMY  1985   BREAST LUMPECTOMY Left 2000   radiation and chemo   BREAST LUMPECTOMY Right 2016   radiation   BREAST SURGERY  2001   lt breast lumpectomy   COLONOSCOPY WITH ESOPHAGOGASTRODUODENOSCOPY (EGD)  04/2020   ENTEROSCOPY N/A 03/15/2021   Procedure: ENTEROSCOPY;  Surgeon: Rachael Fee, MD;  Location: [facility];  Service: Endoscopy;  Laterality: N/A;   GIVENS CAPSULE STUDY  07/2020   HOT HEMOSTASIS N/A 03/15/2021   Procedure: HOT HEMOSTASIS (ARGON PLASMA COAGULATION/BICAP);  Surgeon: Rachael Fee, MD;  Location: [facility];  Service: Endoscopy;  Laterality: N/A;   IR IMAGING GUIDED PORT INSERTION  04/25/2020   RADIOACTIVE SEED GUIDED PARTIAL MASTECTOMY WITH AXILLARY SENTINEL LYMPH NODE BIOPSY Right 07/07/2015   Procedure: RIGHT RADIOACTIVE SEED  GUIDED PARTIAL MASTECTOMY WITH AXILLARY SENTINEL LYMPH NODE BIOPSY;  Surgeon: Chevis Pretty III, MD;  Location: [facility];  Service: General;  Laterality: Right;   SMALL INTESTINE SURGERY     TUBAL LIGATION     UPPER GASTROINTESTINAL ENDOSCOPY      SOCIAL HISTORY: Social History   Socioeconomic History   Marital status: Divorced    Spouse name: Not on file   Number of children: 7   Years of education: Not on file   Highest education level: Not on file  Occupational History   Occupation: retired  Tobacco Use   Smoking status: Former    Current packs/day: 0.00    Average packs/day: 1 pack/day for 20.0 years (20.0 ttl pk-yrs)    Types: Cigarettes    Start date: 07/30/1991    Quit date: 07/30/2011    Years since quitting: 11.9   Smokeless tobacco: Never   Tobacco comments:    Quit >4 years ago; 1 ppd for about 5/20 years  (remaining was less)  Vaping Use   Vaping status: Former  Substance and Sexual Activity   Alcohol use: No    Alcohol/week: 0.0 standard drinks of alcohol   Drug use: No   Sexual activity: Not Currently  Other Topics Concern   Not on file  Social History Narrative   Lives alone.  Retired.  Education:  11th grade GED.  Children:  7 (one here).    Social Drivers of Health   Financial Resource Strain: High Risk (12/26/2022)   Overall Financial Resource Strain (CARDIA)    Difficulty of Paying Living Expenses: Hard  Food Insecurity: Food Insecurity Present (07/16/2023)   Hunger Vital Sign    Worried About Running Out of Food in the Last Year: Sometimes true    Ran Out of Food in the Last Year: Never true  Transportation Needs: Unmet Transportation Needs (07/16/2023)   PRAPARE - Administrator, Civil Service (Medical): Yes    Lack of Transportation (Non-Medical): No  Physical Activity: Inactive (12/26/2022)   Exercise Vital Sign    Days of Exercise per Week: 0 days    Minutes of Exercise per Session: 0 min  Stress: Stress Concern Present (12/26/2022)   [employer] of Occupational Health - Occupational Stress Questionnaire    Feeling of Stress : To some extent  Social Connections: Moderately Isolated (12/26/2022)   Social Connection and Isolation Panel [NHANES]    Frequency of Communication with Friends and Family: More than three times a week    Frequency of Social Gatherings with Friends and Family: Once a week    Attends Religious Services: More than 4 times per year    Active Member of [employer] or Organizations: No    Attends Banker Meetings: Never    Marital Status: Divorced  Catering manager Violence: Not At Risk (07/16/2023)   Humiliation, Afraid, Rape, and Kick questionnaire    Fear of Current or Ex-Partner: No    Emotionally Abused: No    Physically Abused: No    Sexually Abused: No    FAMILY HISTORY: Family History  Problem Relation Age of  Onset   Emphysema Mother 60       smoker   Diabetes Father    Lung cancer Sister        dx. <50; former smoker   Diabetes Brother    Diabetes Brother    Brain cancer Brother 2       unknown tumor type  Diabetes Paternal Aunt    Stroke Maternal Grandmother    Diabetes Paternal Grandmother    Cancer Daughter 45       neck cancer   Other Daughter        hysterectomy for unspecified reason   Colon cancer Daughter    Breast cancer Cousin    Cancer Cousin        unspecified type   Breast cancer Other        triple negative breast cancer in her 91s   Colon polyps Neg Hx    Esophageal cancer Neg Hx    Gallbladder disease Neg Hx    ROS  10 Point review of Systems was done is negative except as noted above.   PHYSICAL EXAMINATION  ECOG PERFORMANCE STATUS: 1 - Symptomatic but completely ambulatory vss GENERAL:alert, in no acute distress and comfortable SKIN: no acute rashes, no significant lesions EYES: conjunctiva are pink and non-injected, sclera anicteric OROPHARYNX: MMM, no exudates, no oropharyngeal erythema or ulceration NECK: supple, no JVD LYMPH:  no palpable lymphadenopathy in the cervical, axillary or inguinal regions LUNGS: clear to auscultation b/l with normal respiratory effort HEART: regular rate & rhythm ABDOMEN:  normoactive bowel sounds , non tender, not distended. Extremity: no pedal edema PSYCH: alert & oriented x 3 with fluent speech NEURO: no focal motor/sensory deficits    LABORATORY DATA: .    Latest Ref Rng & Units 07/13/2023    3:11 PM 07/04/2023    9:20 AM 06/20/2023   11:23 AM  CBC  WBC 4.0 - 10.5 K/uL 7.5  5.5  5.9   Hemoglobin 12.0 - 15.0 g/dL 9.9  9.3  45.4   Hematocrit 36.0 - 46.0 % 30.1  27.7  31.2   Platelets 150 - 400 K/uL 326  258  271    .    Latest Ref Rng & Units 07/13/2023    3:11 PM 07/04/2023    9:20 AM 06/20/2023   11:23 AM  CMP  Glucose 70 - 99 mg/dL 098  119  147   BUN 8 - 23 mg/dL 9  15  15    Creatinine 0.44 -  1.00 mg/dL 8.29  5.62  1.30   Sodium 135 - 145 mmol/L 137  144  140   Potassium 3.5 - 5.1 mmol/L 3.9  3.7  4.3   Chloride 98 - 111 mmol/L 104  110  107   CO2 22 - 32 mmol/L 22  24  24    Calcium 8.9 - 10.3 mg/dL 9.3  8.4  9.2   Total Protein 6.5 - 8.1 g/dL 6.2  6.1  6.7   Total Bilirubin <1.2 mg/dL 0.5  0.2  0.3   Alkaline Phos 38 - 126 U/L 70  59  61   AST 15 - 41 U/L 16  13  11    ALT 0 - 44 U/L 12  9  8      Mammogram 09/23/2022:   ASSESSMENT and THERAPY PLAN:   77 yo female here for follow-up of her multiple myeloma   1)  IgG Kappa Multiple myeloma with bone lesions, anemia, renal insuff. M spike @ 3.7g/dl on diagnosis. 1p deletion, polymorphic variant, 13q deletion Multiple myeloma panel from 06/28/2021 with no M spike.  IFE positive for IgG kappa monoclonal protein possibly from her daratumumab. Currently on monthly daratumumab 2) h/o DM2 3) Diabetic Neuropathy 4) CKD - likely from DM2, but could have an element of myeloma kidney. 5) h/o TIA and AMI 6) Iron  deficiency 7) B12 deficiency   PLAN: -Discussed lab results from today, 07/18/2023, in detail with the patient. CBC shows patient is anemic with hemoglobin of 9.6 g/dL with hematocrit of 91.4%, but stable overall. CMP pending.  -Educated patient about Troponin.  -Discussed with the patient that the chest pain looks like musculoskeletal pain. She has an appointment with her PCP regarding her pain.  -Answered all of patient's questions.  -Continue monthly Daratumumab treatment for continued maintenance -continue monthly B12  -Continue taking weekly vitamin D supplements to strengthen immune system and improve bone health -recommend patient to stat UTD with age-appropriate vaccines -will refill emla cream  Follow-up: ***  The total time spent in the appointment was *** minutes* .  All of the patient's questions were answered with apparent satisfaction. The patient knows to call the clinic with any problems, questions  or concerns.   Wyvonnia Lora MD MS AAHIVMS [facility] Hematology/Oncology Physician [facility]  .*Total Encounter Time as defined by the Centers for Medicare and Medicaid Services includes, in addition to the face-to-face time of a patient visit (documented in the note above) non-face-to-face time: obtaining and reviewing outside history, ordering and reviewing medications, tests or procedures, care coordination (communications with other health care professionals or caregivers) and documentation in the medical record.   I,Param Shah,acting as a Neurosurgeon for Wyvonnia Lora, MD.,have documented all relevant documentation on the behalf of Wyvonnia Lora, MD,as directed by  Wyvonnia Lora, MD while in the presence of Wyvonnia Lora, MD.

## 2023-07-18 NOTE — Telephone Encounter (Signed)
Spoke to Liji and provided verbal orders

## 2023-07-18 NOTE — Progress Notes (Signed)
Patient seen by Dr. Kale  Vitals are within treatment parameters.  Labs reviewed: and are within treatment parameters.  Per physician team, patient is ready for treatment and there are NO modifications to the treatment plan.  

## 2023-07-18 NOTE — Progress Notes (Signed)
Complex Care Management  Outreach Note  07/18/2023 Name: VIRGLE GAUDET MRN: [id number] DOB: 05/14/1946   Complex Care Management Outreach Attempts: An unsuccessful telephone outreach was attempted today to offer the patient information about available complex care management services.  Follow Up Plan:  Additional outreach attempts will be made to offer the patient complex care management information and services.   Encounter Outcome:  No Answer  [facility]  Population Health Careguide  Direct Dial: [phone] Website: [email or web address]

## 2023-07-18 NOTE — Patient Instructions (Signed)
[facility] - A DEPT OF [facility]   Discharge Instructions: Thank you for choosing [facility] to provide your oncology and hematology care.   If you have a lab appointment with the Cancer Center, please go directly to the Cancer Center and check in at the registration area.   Wear comfortable clothing and clothing appropriate for easy access to any Portacath or PICC line.   We strive to give you quality time with your provider. You may need to reschedule your appointment if you arrive late (15 or more minutes).  Arriving late affects you and other patients whose appointments are after yours.  Also, if you miss three or more appointments without notifying the office, you may be dismissed from the clinic at the provider's discretion.      For prescription refill requests, have your pharmacy contact our office and allow 72 hours for refills to be completed.    Today you received the following chemotherapy and/or immunotherapy agents: Daratumumab (Darzalex)      To help prevent nausea and vomiting after your treatment, we encourage you to take your nausea medication as directed.  BELOW ARE SYMPTOMS THAT SHOULD BE REPORTED IMMEDIATELY: *FEVER GREATER THAN 100.4 F (38 C) OR HIGHER *CHILLS OR SWEATING *NAUSEA AND VOMITING THAT IS NOT CONTROLLED WITH YOUR NAUSEA MEDICATION *UNUSUAL SHORTNESS OF BREATH *UNUSUAL BRUISING OR BLEEDING *URINARY PROBLEMS (pain or burning when urinating, or frequent urination) *BOWEL PROBLEMS (unusual diarrhea, constipation, pain near the anus) TENDERNESS IN MOUTH AND THROAT WITH OR WITHOUT PRESENCE OF ULCERS (sore throat, sores in mouth, or a toothache) UNUSUAL RASH, SWELLING OR PAIN  UNUSUAL VAGINAL DISCHARGE OR ITCHING   Items with * indicate a potential emergency and should be followed up as soon as possible or go to the Emergency Department if any problems should occur.  Please show the CHEMOTHERAPY ALERT CARD or  IMMUNOTHERAPY ALERT CARD at check-in to the Emergency Department and triage nurse.  Should you have questions after your visit or need to cancel or reschedule your appointment, please contact [facility] - A DEPT OF [facility]  Dept: [phone]  and follow the prompts.  Office hours are 8:00 a.m. to 4:30 p.m. Monday - Friday. Please note that voicemails left after 4:00 p.m. may not be returned until the following business day.  We are closed weekends and major holidays. You have access to a nurse at all times for urgent questions. Please call the main number to the clinic Dept: [phone] and follow the prompts.   For any non-urgent questions, you may also contact your provider using MyChart. We now offer e-Visits for anyone 76 and older to request care online for non-urgent symptoms. For details visit [email or web address].   Also download the MyChart app! Go to the app store, search "MyChart", open the app, select [place], and log in with your MyChart username and password.

## 2023-07-20 ENCOUNTER — Other Ambulatory Visit: Payer: Self-pay

## 2023-07-22 ENCOUNTER — Ambulatory Visit: Payer: Self-pay

## 2023-07-22 ENCOUNTER — Ambulatory Visit: Payer: Medicare Other | Admitting: Family Medicine

## 2023-07-22 ENCOUNTER — Encounter: Payer: Self-pay | Admitting: Family Medicine

## 2023-07-22 VITALS — BP 138/70 | HR 68 | Ht 60.0 in | Wt 191.0 lb

## 2023-07-22 DIAGNOSIS — E1140 Type 2 diabetes mellitus with diabetic neuropathy, unspecified: Secondary | ICD-10-CM

## 2023-07-22 DIAGNOSIS — R1013 Epigastric pain: Secondary | ICD-10-CM

## 2023-07-22 DIAGNOSIS — K224 Dyskinesia of esophagus: Secondary | ICD-10-CM | POA: Diagnosis not present

## 2023-07-22 DIAGNOSIS — I700 Atherosclerosis of aorta: Secondary | ICD-10-CM | POA: Diagnosis not present

## 2023-07-22 DIAGNOSIS — I088 Other rheumatic multiple valve diseases: Secondary | ICD-10-CM | POA: Diagnosis not present

## 2023-07-22 DIAGNOSIS — E1122 Type 2 diabetes mellitus with diabetic chronic kidney disease: Secondary | ICD-10-CM | POA: Diagnosis not present

## 2023-07-22 DIAGNOSIS — N1831 Chronic kidney disease, stage 3a: Secondary | ICD-10-CM | POA: Diagnosis not present

## 2023-07-22 DIAGNOSIS — I1310 Hypertensive heart and chronic kidney disease without heart failure, with stage 1 through stage 4 chronic kidney disease, or unspecified chronic kidney disease: Secondary | ICD-10-CM | POA: Diagnosis not present

## 2023-07-22 DIAGNOSIS — I2510 Atherosclerotic heart disease of native coronary artery without angina pectoris: Secondary | ICD-10-CM | POA: Diagnosis not present

## 2023-07-22 MED ORDER — HYDROCODONE-ACETAMINOPHEN 5-325 MG PO TABS: 1.0000 | ORAL_TABLET | Freq: Four times a day (QID) | ORAL | 0 refills | Status: DC | PRN

## 2023-07-22 MED ORDER — SUCRALFATE 1 G PO TABS: 1.0000 g | ORAL_TABLET | Freq: Three times a day (TID) | ORAL | 0 refills | Status: DC

## 2023-07-22 NOTE — Progress Notes (Signed)
   Subjective:    Patient ID: Tracy [name], female    DOB: 29-Apr-1946, 77 y.o.   MRN: [id number]  HPI Hospital f/u- pt was admitted 12/15-17 w/ chest pain and syncope.  In the ER, CP was reproducible w/ palpation and she was hypertensive.  Hgb 9.9, Cr 1.35, Troponin 94 --> 81.  She had prodrome of dizziness before passing out.  CT head was normal, CTA chest/abd/pelvis w/o evidence of dissection.  ECHO showed EF 65-70%.  MRI brain was negative for acute chain.  SNF was recommended at time of d/c but pt declined.  H&P, D/C summary, labs, imaging reviewed.  Meds reconciled.    Pt continues to complain of weakness and pain.  She reports that she did not actually pass out prior to hospitalization, 'my body just gave out'.  Pt reports she is having pain at base of sternum and will episodically 'spasm' and 'doubles me over'.  Currently on Pantoprazole 40mg  BID.  Pt reports at times pain is related to eating- at times eating will lessen pain but other times, pain worsens.  Pain is radiating 'straight through to the middle of my back'.  Spasms can last anywhere from seconds to 30 minutes.  Sxs will resolve spontaneously.   Review of Systems For ROS see HPI     Objective:   Physical Exam Vitals reviewed.  Constitutional:      General: She is not in acute distress.    Appearance: Normal appearance. She is obese. She is not ill-appearing.  Cardiovascular:     Rate and Rhythm: Normal rate and regular rhythm.     Heart sounds: Normal heart sounds.  Pulmonary:     Effort: Pulmonary effort is normal. No respiratory distress.     Breath sounds: Normal breath sounds. No wheezing or rhonchi.  Chest:     Chest wall: Tenderness (TTP over xyphoid) present.  Abdominal:     General: There is no distension.     Palpations: Abdomen is soft.     Tenderness: There is no abdominal tenderness. There is no guarding or rebound.  Skin:    General: Skin is warm and dry.  Neurological:     General: No focal  deficit present.     Mental Status: She is alert and oriented to person, place, and time.  Psychiatric:        Mood and Affect: Mood normal.        Behavior: Behavior normal.        Thought Content: Thought content normal.           Assessment & Plan:  Epigastric pain- new.  Reviewed ER work up.  Agree this is unlikely to be cardiac and CT angio did not show anything concerning regarding her aorta.  Given her description of episodic, doubling over pain that feels like a heart attack, I'm suspicious for esophageal spasm.  She is already on Protonix BID.  Will add Carafate and check for H pylori.  She does have TTP over the xyphoid process- refill provided on hydrocodone.  Will follow.  DM- pt overdue for A1C and microalbumin.  Collected today.  Will adjust meds prn.

## 2023-07-22 NOTE — Addendum Note (Signed)
Addended by: [name] on: 07/22/2023 12:18 PM   Modules accepted: Orders

## 2023-07-22 NOTE — Patient Instructions (Addendum)
Follow up as needed or as scheduled We'll notify you of your lab results and make any changes if needed START the Sucralfate before each meal and before bed to protect your stomach and esophagus from inflammation CONTINUE the Pantoprazole twice daily Make sure you are eating and drinking regularly- even if it's small amounts throughout the day USE the hydrocodone as needed for severe pain Call with any questions or concerns If symptoms change or worsen- please go back to the ER just to be sure Hang in there! Happy Holidays!!

## 2023-07-22 NOTE — Patient Outreach (Signed)
  Care Coordination   07/22/2023 Name: CHANIYA CUSTER MRN: [id number] DOB: Dec 07, 1945   Care Coordination Outreach Attempts:  An unsuccessful outreach was attempted for an appointment today.  Follow Up Plan:  Additional outreach attempts will be made to offer the patient complex care management information and services.   Encounter Outcome:  No Answer   Care Coordination Interventions:  No, not indicated    SIG Lysle Morales, BSW Social Worker [phone]

## 2023-07-23 ENCOUNTER — Encounter: Payer: Self-pay | Admitting: Hematology

## 2023-07-23 LAB — HEMOGLOBIN A1C
Hgb A1c MFr Bld: 7 %{Hb} — ABNORMAL HIGH (ref <5.7)
Mean Plasma Glucose: 154 mg/dL
eAG (mmol/L): 8.5 mmol/L

## 2023-07-23 LAB — MICROALBUMIN / CREATININE URINE RATIO
Creatinine, Urine: 90 mg/dL (ref 20–275)
Microalb Creat Ratio: 6 mg/g{creat} (ref <30)
Microalb, Ur: 0.5 mg/dL

## 2023-07-24 ENCOUNTER — Telehealth: Payer: Self-pay | Admitting: *Deleted

## 2023-07-24 ENCOUNTER — Telehealth: Payer: Self-pay | Admitting: Family Medicine

## 2023-07-24 DIAGNOSIS — N1831 Chronic kidney disease, stage 3a: Secondary | ICD-10-CM | POA: Diagnosis not present

## 2023-07-24 DIAGNOSIS — E1122 Type 2 diabetes mellitus with diabetic chronic kidney disease: Secondary | ICD-10-CM | POA: Diagnosis not present

## 2023-07-24 DIAGNOSIS — I1310 Hypertensive heart and chronic kidney disease without heart failure, with stage 1 through stage 4 chronic kidney disease, or unspecified chronic kidney disease: Secondary | ICD-10-CM | POA: Diagnosis not present

## 2023-07-24 DIAGNOSIS — I088 Other rheumatic multiple valve diseases: Secondary | ICD-10-CM | POA: Diagnosis not present

## 2023-07-24 DIAGNOSIS — I2510 Atherosclerotic heart disease of native coronary artery without angina pectoris: Secondary | ICD-10-CM | POA: Diagnosis not present

## 2023-07-24 DIAGNOSIS — I700 Atherosclerosis of aorta: Secondary | ICD-10-CM | POA: Diagnosis not present

## 2023-07-24 LAB — H. PYLORI BREATH TEST: H. pylori Breath Test: NOT DETECTED

## 2023-07-24 NOTE — Telephone Encounter (Signed)
Placed in folder at nurse station

## 2023-07-24 NOTE — Progress Notes (Signed)
Complex Care Management Note Care Guide Note  07/24/2023 Name: Tracy [name] MRN: [id number] DOB: 1945/09/02   Complex Care Management Outreach Attempts: A third unsuccessful outreach was attempted today to offer the patient with information about available complex care management services.  Follow Up Plan:  No further outreach attempts will be made at this time. We have been unable to contact the patient to offer or enroll patient in complex care management services.  Encounter Outcome:  No Answer  [facility]  Population Health Careguide  Direct Dial: [phone] Website: [email or web address]

## 2023-07-24 NOTE — Telephone Encounter (Signed)
Home Health Certification or Plan of Care Tracking  Is this a Certification or Plan of Care? Plan of Care  [facility] Agency: [facility]  Order Number:  [id number]  Has charge sheet been attached? Yes  Where has form been placed:   Labeled & placed in provider bin

## 2023-07-25 ENCOUNTER — Telehealth: Payer: Self-pay

## 2023-07-25 DIAGNOSIS — I088 Other rheumatic multiple valve diseases: Secondary | ICD-10-CM | POA: Diagnosis not present

## 2023-07-25 DIAGNOSIS — I1310 Hypertensive heart and chronic kidney disease without heart failure, with stage 1 through stage 4 chronic kidney disease, or unspecified chronic kidney disease: Secondary | ICD-10-CM | POA: Diagnosis not present

## 2023-07-25 DIAGNOSIS — E1122 Type 2 diabetes mellitus with diabetic chronic kidney disease: Secondary | ICD-10-CM | POA: Diagnosis not present

## 2023-07-25 DIAGNOSIS — I2510 Atherosclerotic heart disease of native coronary artery without angina pectoris: Secondary | ICD-10-CM | POA: Diagnosis not present

## 2023-07-25 DIAGNOSIS — N1831 Chronic kidney disease, stage 3a: Secondary | ICD-10-CM | POA: Diagnosis not present

## 2023-07-25 DIAGNOSIS — I700 Atherosclerosis of aorta: Secondary | ICD-10-CM | POA: Diagnosis not present

## 2023-07-25 NOTE — Telephone Encounter (Signed)
Okay to approve verbal orders? Please advise

## 2023-07-25 NOTE — Telephone Encounter (Signed)
Copied from CRM [id number]. Topic: Clinical - Home Health Verbal Orders >> Jul 25, 2023  9:09 AM Adele Barthel wrote: Caller/Agency: [facility] Callback Number: [phone], Michella Service Requested: Occupational Therapy Frequency: 1 time a week for 5 weeks Any new concerns about the patient? No

## 2023-07-26 NOTE — Telephone Encounter (Signed)
Ok for verbal orders ?

## 2023-07-28 ENCOUNTER — Telehealth: Payer: Self-pay | Admitting: Family Medicine

## 2023-07-28 NOTE — Telephone Encounter (Signed)
Placed in your sign folder at the nurse station

## 2023-07-28 NOTE — Telephone Encounter (Signed)
Home Health Certification or Plan of Care Tracking  Is this a Certification or Plan of Care? Plan of Care  [facility] Agency: [facility]  Order Number:  [id number]  Has charge sheet been attached? Yes  Where has form been placed:   Labeled & placed in provider bin

## 2023-07-28 NOTE — Telephone Encounter (Signed)
Called and provided VM with this order on the secure number as directed

## 2023-07-29 ENCOUNTER — Ambulatory Visit: Payer: Self-pay | Admitting: Licensed Clinical Social Worker

## 2023-07-29 ENCOUNTER — Telehealth: Payer: Self-pay | Admitting: Family Medicine

## 2023-07-29 DIAGNOSIS — E1122 Type 2 diabetes mellitus with diabetic chronic kidney disease: Secondary | ICD-10-CM | POA: Diagnosis not present

## 2023-07-29 DIAGNOSIS — N1831 Chronic kidney disease, stage 3a: Secondary | ICD-10-CM | POA: Diagnosis not present

## 2023-07-29 DIAGNOSIS — I1310 Hypertensive heart and chronic kidney disease without heart failure, with stage 1 through stage 4 chronic kidney disease, or unspecified chronic kidney disease: Secondary | ICD-10-CM | POA: Diagnosis not present

## 2023-07-29 DIAGNOSIS — I700 Atherosclerosis of aorta: Secondary | ICD-10-CM | POA: Diagnosis not present

## 2023-07-29 DIAGNOSIS — I2510 Atherosclerotic heart disease of native coronary artery without angina pectoris: Secondary | ICD-10-CM | POA: Diagnosis not present

## 2023-07-29 DIAGNOSIS — I088 Other rheumatic multiple valve diseases: Secondary | ICD-10-CM | POA: Diagnosis not present

## 2023-07-29 NOTE — Patient Outreach (Signed)
  Care Coordination   Follow Up Visit Note   07/29/2023 Name: Tracy [name] MRN: [id number] DOB: Dec 11, 1945  Tracy [name] is a 77 y.o. year old female who sees [name], Katherine E, MD for primary care. I spoke with  Tracy [name] by phone today.  What matters to the patients health and wellness today?  Symptom Management    Goals Addressed             This Visit's Progress    LCSW-Plan of Care   On track    Activities and task to complete in order to accomplish goals.   Keep all upcoming appointments discussed today Continue with compliance of taking medication prescribed by Doctor Implement healthy coping skills discussed to assist with management of symptoms           SDOH assessments and interventions completed:  No     Care Coordination Interventions:  Yes, provided  Interventions Today    Flowsheet Row Most Recent Value  Chronic Disease   Chronic disease during today's visit Hypertension (HTN), Chronic Kidney Disease/End Stage Renal Disease (ESRD), Other  [Breast Cancer, GAD, Depression, Insomnia]  General Interventions   General Interventions Discussed/Reviewed General Interventions Reviewed, Doctor Visits  Doctor Visits Discussed/Reviewed Doctor Visits Reviewed  Mental Health Interventions   Mental Health Discussed/Reviewed Mental Health Reviewed, Coping Strategies, Anxiety, Depression  Pharmacy Interventions   Pharmacy Dicussed/Reviewed Pharmacy Topics Reviewed, Medication Adherence  Safety Interventions   Safety Discussed/Reviewed Safety Reviewed       Follow up plan: Follow up call scheduled for 4-6 weeks    Encounter Outcome:  Patient Visit Completed   Rolin Kerns, MSW, LCSW [facility] Care Management [facility]  [facility] [email or web address] Phone [phone] 2:37 PM

## 2023-07-29 NOTE — Telephone Encounter (Signed)
 Home Health Certification or Plan of Care Tracking  Is this a Certification or Plan of Care? Plan of Care  [facility] Agency: [facility]  Order Number:  [id number]  Has charge sheet been attached? Yes  Where has form been placed:   Labeled & placed in provider bin

## 2023-07-29 NOTE — Patient Instructions (Signed)
 Visit Information  Thank you for taking time to visit with me today. Please don't hesitate to contact me if I can be of assistance to you.   Following are the goals we discussed today:   Goals Addressed             This Visit's Progress    LCSW-Plan of Care   On track    Activities and task to complete in order to accomplish goals.   Keep all upcoming appointments discussed today Continue with compliance of taking medication prescribed by Doctor Implement healthy coping skills discussed to assist with management of symptoms           Our next appointment is by telephone on 02/04 at 12:30 pm  Please call the care guide team at [phone] if you need to cancel or reschedule your appointment.   If you are experiencing a Mental Health or Behavioral Health Crisis or need someone to talk to, please call the Suicide and Crisis Lifeline: 988 call 911   Patient verbalizes understanding of instructions and care plan provided today and agrees to view in MyChart. Active MyChart status and patient understanding of how to access instructions and care plan via MyChart confirmed with patient.     Seraphim Affinito, MSW, LCSW [facility] Care Management [place]  [facility] [email or web address] Phone [phone] 2:39 PM

## 2023-07-29 NOTE — Telephone Encounter (Signed)
 Placed in your sign folder at nurse station for attention

## 2023-07-30 ENCOUNTER — Other Ambulatory Visit: Payer: Self-pay

## 2023-07-31 ENCOUNTER — Other Ambulatory Visit (INDEPENDENT_AMBULATORY_CARE_PROVIDER_SITE_OTHER): Payer: Medicare Other

## 2023-07-31 ENCOUNTER — Ambulatory Visit: Payer: Medicare Other | Admitting: Gastroenterology

## 2023-07-31 ENCOUNTER — Encounter: Payer: Self-pay | Admitting: Gastroenterology

## 2023-07-31 ENCOUNTER — Telehealth: Payer: Self-pay | Admitting: Family Medicine

## 2023-07-31 VITALS — BP 130/74 | HR 84 | Ht 60.0 in | Wt 188.4 lb

## 2023-07-31 DIAGNOSIS — R1013 Epigastric pain: Secondary | ICD-10-CM

## 2023-07-31 DIAGNOSIS — Z87891 Personal history of nicotine dependence: Secondary | ICD-10-CM | POA: Diagnosis not present

## 2023-07-31 DIAGNOSIS — C9000 Multiple myeloma not having achieved remission: Secondary | ICD-10-CM

## 2023-07-31 DIAGNOSIS — G8929 Other chronic pain: Secondary | ICD-10-CM | POA: Diagnosis not present

## 2023-07-31 DIAGNOSIS — E1140 Type 2 diabetes mellitus with diabetic neuropathy, unspecified: Secondary | ICD-10-CM

## 2023-07-31 LAB — C-REACTIVE PROTEIN: CRP: <1 mg/dL (ref 0.5–20.0)

## 2023-07-31 LAB — GAMMA GT: GGT: 35 U/L (ref 7–51)

## 2023-07-31 NOTE — Patient Instructions (Signed)
 Your provider has requested that you go to the basement level for lab work before leaving today. Press B on the elevator. The lab is located at the first door on the left as you exit the elevator.  Start IB gard samples given.   You have been scheduled for a gastric emptying scan at [facility] on 08/18/23 at 8:30am. Please arrive at least 30 minutes prior to your appointment for registration. Please make certain not to have anything to eat or drink after midnight the night before your test. Hold all stomach medications (ex: Zofran , phenergan , Reglan ) 24 hours prior to your test. If you need to reschedule your appointment, please contact radiology scheduling at [phone]. _____________________________________________________________________ A gastric-emptying study measures how long it takes for food to move through your stomach. There are several ways to measure stomach emptying. In the most common test, you eat food that contains a small amount of radioactive material. A scanner that detects the movement of the radioactive material is placed over your abdomen to monitor the rate at which food leaves your stomach. This test normally takes about 4 hours to complete.  If your blood pressure at your visit was 140/90 or greater, please contact your primary care physician to follow up on this.  _______________________________________________________  If you are age 4 or older, your body mass index should be between 23-30. Your Body mass index is 36.79 kg/m. If this is out of the aforementioned range listed, please consider follow up with your Primary Care Provider.  If you are age 28 or younger, your body mass index should be between 19-25. Your Body mass index is 36.79 kg/m. If this is out of the aformentioned range listed, please consider follow up with your Primary Care Provider.   ________________________________________________________  The [facility] providers would like to  encourage you to use MYCHART to communicate with providers for non-urgent requests or questions.  Due to long hold times on the telephone, sending your provider a message by [facility] may be a faster and more efficient way to get a response.  Please allow 48 business hours for a response.  Please remember that this is for non-urgent requests.   It was a pleasure to see you today!  Thank you for trusting me with your gastrointestinal care!    Scott E.Stacia, MD

## 2023-07-31 NOTE — Telephone Encounter (Signed)
 Placed in folder at nurse station

## 2023-07-31 NOTE — Progress Notes (Signed)
 HPI : Tracy [name] is a 78 y.o. female who is referred to us  by Mahlon Comer BRAVO, MD for further evaluation of chronic epigastric pain.  She was previously followed by Dr. Teressa  She has multiple comorbidities.  She was diagnosed with breast cancer in 2001 and then again in 2016, and diagnosed with multiple myeloma in June 2021 with associated bone lesions.  She has been undergoing chemotherapy and completed a course of radiation.  She is currently on monthly Davatunumab and is followed by Dr. Onesimo.  She also has a anemia of chronic disease, adult onset diabetes mellitus, osteoarthritis, sleep apnea, hypertension, coronary artery disease, history of prior TIA and history of intestinal AVMs   Previous GI evaluation include an EGD in October 2021 which showed mild gastritis and was otherwise negative Colonoscopy October 2021 with a few diverticuli and internal and external hemorrhoids. 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  A repeat EGD in Oct 2022 to evaluate nausea was unremarkable, biopsies showed mild chronic gastritis without H. pylori..   Today, the patient reports a chief complaint of persistent epigastric pain, described as a sensation of something tied around the upper abdomen, radiating to the back. The discomfort is constant, varying between a dull ache and occasional sharp pains. The patient reports this pain has been present for approximately one month.  She has chronic nausea, which she experiences every time she eats.  She does not usually vomit.  Does not seem to matter what she eats, she will experience nausea shortly after starting a meal.  The patient also reports a change in bowel habits over the past few weeks. Previously experiencing loose stools, the patient now describes bowel movements as more solid, occurring multiple times a day, often following urination. The patient denies any difficulty controlling bowel  movements. Additionally, the patient has noticed the presence of mucus in the stool, although this has improved recently.  Her weight has been stable.   An H. pylori breath test recently was unremarkable.  She went to the emergency department in mid December for this pain which was associated with a syncopal episode.  She had a CT-A of the abdomen/pelvis which was unremarkable.  The ER providers were concerned about possible underlying cardiac cause.  The pain was much more severe a few weeks ago.  The patient was recently prescribed Carafate  by her PCP, in addition to PPI. The patient reports a sensation of tightness across the upper abdomen after taking this medication, leading to intermittent discontinuation. However, the prescribing physician advised that the medication was unlikely to be the cause of the discomfort and recommended resuming it.  She had a CT scan in July 2024 to evaluate upper abdominal pain which was unremarkable.  The patient states that she does not remember having pain and does not remember having that CT scan.  The patient has a history of multiple myeloma and breast cancer, both of which have been treated with radiation therapy. The patient continues to receive treatment for multiple myeloma, including monthly infusions. The patient is also on long-term Metformin  therapy. The patient denies any recent changes in medication, apart from the initiation of Carafate .      CT abdomen/pelvis January 27, 2023 IMPRESSION: 1. No acute intra-abdominal pathology identified. No definite radiographic explanation for the patient's reported symptoms. No nephroureterolithiasis or obstructive uropathy. 2. Stable lytic lesions throughout the imaged bones. No pathological fracture. 3. Aortic atherosclerosis.  Past Medical History:  Diagnosis Date   Angiodysplasia of intestine 08/23/2020   Anxiety    Arthritis    Breast cancer (HCC) 06/13/15   Cancer (HCC) 2000   breast cancer    Chronic bronchitis (HCC)    Chronic bronchitis (HCC)    Hyperlipidemia    Hypertension    Myocardial infarction [facility]) 2001   Personal history of radiation therapy    Restless leg    Stroke (HCC) 2004   TIA, no deficits     Past Surgical History:  Procedure Laterality Date   ABDOMINAL HYSTERECTOMY  1985   BREAST LUMPECTOMY Left 2000   radiation and chemo   BREAST LUMPECTOMY Right 2016   radiation   BREAST SURGERY  2001   lt breast lumpectomy   COLONOSCOPY WITH ESOPHAGOGASTRODUODENOSCOPY (EGD)  04/2020   ENTEROSCOPY N/A 03/15/2021   Procedure: ENTEROSCOPY;  Surgeon: Teressa Toribio SQUIBB, MD;  Location: [facility];  Service: Endoscopy;  Laterality: N/A;   GIVENS CAPSULE STUDY  07/2020   HOT HEMOSTASIS N/A 03/15/2021   Procedure: HOT HEMOSTASIS (ARGON PLASMA COAGULATION/BICAP);  Surgeon: Teressa Toribio SQUIBB, MD;  Location: [facility];  Service: Endoscopy;  Laterality: N/A;   IR IMAGING GUIDED PORT INSERTION  04/25/2020   RADIOACTIVE SEED GUIDED PARTIAL MASTECTOMY WITH AXILLARY SENTINEL LYMPH NODE BIOPSY Right 07/07/2015   Procedure: RIGHT RADIOACTIVE SEED GUIDED PARTIAL MASTECTOMY WITH AXILLARY SENTINEL LYMPH NODE BIOPSY;  Surgeon: Deward Null III, MD;  Location: [facility];  Service: General;  Laterality: Right;   SMALL INTESTINE SURGERY     TUBAL LIGATION     UPPER GASTROINTESTINAL ENDOSCOPY     Family History  Problem Relation Age of Onset   Emphysema Mother 62       smoker   Diabetes Father    Lung cancer Sister        dx. <50; former smoker   Diabetes Brother    Diabetes Brother    Brain cancer Brother 37       unknown tumor type   Diabetes Paternal Aunt    Stroke Maternal Grandmother    Diabetes Paternal Grandmother    Cancer Daughter 45       neck cancer   Other Daughter        hysterectomy for unspecified reason   Colon cancer Daughter    Breast cancer Cousin    Cancer Cousin        unspecified type   Breast cancer Other        triple negative  breast cancer in her 88s   Colon polyps Neg Hx    Esophageal cancer Neg Hx    Gallbladder disease Neg Hx    Social History   Tobacco Use   Smoking status: Former    Current packs/day: 0.00    Average packs/day: 1 pack/day for 20.0 years (20.0 ttl pk-yrs)    Types: Cigarettes    Start date: 07/30/1991    Quit date: 07/30/2011    Years since quitting: 12.0   Smokeless tobacco: Never   Tobacco comments:    Quit >4 years ago; 1 ppd for about 5/20 years (remaining was less)  Vaping Use   Vaping status: Former  Substance Use Topics   Alcohol use: No    Alcohol/week: 0.0 standard drinks of alcohol   Drug use: No   Current Outpatient Medications  Medication Sig Dispense Refill   acyclovir  (ZOVIRAX ) 400 MG tablet TAKE 1 TABLET BY MOUTH TWICE  DAILY 120 tablet 5  atorvastatin  (LIPITOR) 10 MG tablet TAKE 1 TABLET BY MOUTH DAILY 100 tablet 2   gabapentin  (NEURONTIN ) 300 MG capsule Take 2 capsules (600 mg total) by mouth 3 (three) times daily. 540 capsule 4   glucose blood (ACCU-CHEK GUIDE) test strip Use as instructed to check sugars 1-2 times daily. 100 each 12   HYDROcodone -acetaminophen  (NORCO/VICODIN) 5-325 MG tablet Take 1 tablet by mouth every 6 (six) hours as needed for moderate pain (pain score 4-6). 20 tablet 0   lidocaine  (LIDODERM ) 5 % Place 1 patch onto the skin daily. Remove & Discard patch within 12 hours or as directed by MD (Patient taking differently: Place 1 patch onto the skin daily as needed (for pain).) 30 patch 0   lidocaine -prilocaine  (EMLA ) cream Apply 1 Application topically as needed (Apply 1 application to port site 1 hour prior to access as needed). 30 g 0   meclizine  (ANTIVERT ) 25 MG tablet Take 1 tablet (25 mg total) by mouth 3 (three) times daily as needed for dizziness. 30 tablet 0   metFORMIN  (GLUCOPHAGE ) 500 MG tablet TAKE 1 TABLET BY MOUTH  TWICE DAILY WITH A MEAL 180 tablet 3   ondansetron  (ZOFRAN ) 8 MG tablet Take 1 tablet (8 mg total) by mouth every 8  (eight) hours as needed for nausea or vomiting. 60 tablet 0   pantoprazole  (PROTONIX ) 40 MG tablet TAKE 1 TABLET BY MOUTH TWICE  DAILY 200 tablet 2   sucralfate  (CARAFATE ) 1 g tablet Take 1 tablet (1 g total) by mouth 4 (four) times daily -  with meals and at bedtime. 120 tablet 0   tiZANidine  (ZANAFLEX ) 4 MG tablet TAKE 1 TABLET BY MOUTH AT  BEDTIME 90 tablet 1   traZODone  (DESYREL ) 100 MG tablet TAKE 1 TABLET BY MOUTH AT  BEDTIME 90 tablet 3   Vitamin D , Ergocalciferol , (DRISDOL ) 1.25 MG (50000 UNIT) CAPS capsule TAKE 1 CAPSULE BY MOUTH ONCE  WEEKLY 15 capsule 2   No current facility-administered medications for this visit.   Allergies  Allergen Reactions   Bacitracin-Neomycin-Polymyxin  [Neomycin-Bacitracin Zn-Polymyx] Swelling   Nsaids Other (See Comments)    nosebleeds   Tape Hives    Pt cannot tolerate bandaids, tape, or any other adhesives.    Ambien [Zolpidem Tartrate]     Hives    Amoxicillin     Rash    Clavulanic Acid Hives   Contrast Media [Iodinated Contrast Media] Hives    Pt states hives with prior ct, was given benadryl  to resolve   Latex Swelling   Prednisone  Swelling    Pt tolerates Dexamethasone . Throat swelling   Tessalon  [Benzonatate ] Itching and Rash     Review of Systems: All systems reviewed and negative except where noted in HPI.    MR BRAIN WO CONTRAST Result Date: 07/15/2023 CLINICAL DATA:  Neuro deficit, acute, stroke suspected EXAM: MRI HEAD WITHOUT CONTRAST TECHNIQUE: Multiplanar, multiecho pulse sequences of the brain and surrounding structures were obtained without intravenous contrast. COMPARISON:  Head CT 07/13/2023, brain MR 08/13/2017 FINDINGS: Brain: Negative for acute infarct. No hemorrhage. No hydrocephalus. No extra-axial fluid collection. No mass effect. No mass lesion. Vascular: Normal flow voids. Skull and upper cervical spine: Normal marrow signal. Sinuses/Orbits: No middle ear or mastoid effusion. Paranasal sinuses are clear. Orbits  unremarkable. Other: None. IMPRESSION: No acute intracranial process. Electronically Signed   By: Lyndall Gore M.D.   On: 07/15/2023 09:54   ECHOCARDIOGRAM COMPLETE Result Date: 07/14/2023    ECHOCARDIOGRAM REPORT   Patient Name:  HUDSON DELENA FLESHER Date of Exam: 07/14/2023 Medical Rec #:  [id number]      Height:       60.0 in Accession #:    [id number]     Weight:       178.6 lb Date of Birth:  1946/06/16      BSA:          1.779 m Patient Age:    70 years       BP:           113/39 mmHg Patient Gender: F              HR:           72 bpm. Exam Location:  Inpatient Procedure: 2D Echo, Cardiac Doppler and Color Doppler Indications:    syncope  History:        Patient has no prior history of Echocardiogram examinations.                 CAD, TIA; Risk Factors:Hypertension and Diabetes.  Sonographer:    Lanell Maduro Referring Phys: [id number] ROBERT DORRELL IMPRESSIONS  1. Left ventricular ejection fraction, by estimation, is 65 to 70%. The left ventricle has normal function. The left ventricle has no regional wall motion abnormalities. There is mild concentric left ventricular hypertrophy. Left ventricular diastolic parameters are consistent with Grade I diastolic dysfunction (impaired relaxation). The average left ventricular global longitudinal strain is -19.4 %. The global longitudinal strain is normal.  2. Right ventricular systolic function is normal. The right ventricular size is normal.  3. Left atrial size was mildly dilated.  4. The mitral valve is normal in structure. Trivial mitral valve regurgitation. No evidence of mitral stenosis. Moderate mitral annular calcification.  5. The aortic valve is normal in structure. Aortic valve regurgitation is not visualized. Aortic valve sclerosis/calcification is present, without any evidence of aortic stenosis.  6. The inferior vena cava is normal in size with greater than 50% respiratory variability, suggesting right atrial pressure of 3 mmHg. FINDINGS  Left  Ventricle: Left ventricular ejection fraction, by estimation, is 65 to 70%. The left ventricle has normal function. The left ventricle has no regional wall motion abnormalities. The average left ventricular global longitudinal strain is -19.4 %. The global longitudinal strain is normal. The left ventricular internal cavity size was normal in size. There is mild concentric left ventricular hypertrophy. Left ventricular diastolic parameters are consistent with Grade I diastolic dysfunction (impaired relaxation). Right Ventricle: The right ventricular size is normal. No increase in right ventricular wall thickness. Right ventricular systolic function is normal. Left Atrium: Left atrial size was mildly dilated. Right Atrium: Right atrial size was normal in size. Pericardium: There is no evidence of pericardial effusion. Mitral Valve: The mitral valve is normal in structure. Moderate mitral annular calcification. Trivial mitral valve regurgitation. No evidence of mitral valve stenosis. Tricuspid Valve: The tricuspid valve is normal in structure. Tricuspid valve regurgitation is trivial. No evidence of tricuspid stenosis. Aortic Valve: The aortic valve is normal in structure. Aortic valve regurgitation is not visualized. Aortic valve sclerosis/calcification is present, without any evidence of aortic stenosis. Aortic valve mean gradient measures 8.8 mmHg. Aortic valve peak  gradient measures 19.6 mmHg. Aortic valve area, by VTI measures 2.06 cm. Pulmonic Valve: The pulmonic valve was normal in structure. Pulmonic valve regurgitation is trivial. No evidence of pulmonic stenosis. Aorta: The aortic root is normal in size and structure. Venous: The inferior vena cava is normal in size with greater  than 50% respiratory variability, suggesting right atrial pressure of 3 mmHg. IAS/Shunts: No atrial level shunt detected by color flow Doppler.  LEFT VENTRICLE PLAX 2D LVIDd:         3.90 cm     Diastology LVIDs:         1.90 cm      LV e' medial:    8.27 cm/s LV PW:         1.10 cm     LV E/e' medial:  13.1 LV IVS:        1.00 cm     LV e' lateral:   8.38 cm/s LVOT diam:     1.90 cm     LV E/e' lateral: 12.9 LV SV:         75 LV SV Index:   42          2D Longitudinal Strain LVOT Area:     2.84 cm    2D Strain GLS Avg:     -19.4 %  LV Volumes (MOD) LV vol d, MOD A2C: 54.5 ml 3D Volume EF: LV vol d, MOD A4C: 61.2 ml 3D EF:        59 % LV vol s, MOD A2C: 13.6 ml LV EDV:       88 ml LV vol s, MOD A4C: 11.9 ml LV ESV:       35 ml LV SV MOD A2C:     40.9 ml LV SV:        52 ml LV SV MOD A4C:     61.2 ml LV SV MOD BP:      46.3 ml RIGHT VENTRICLE             IVC RV Basal diam:  2.60 cm     IVC diam: 1.60 cm RV S prime:     18.50 cm/s TAPSE (M-mode): 2.5 cm LEFT ATRIUM             Index        RIGHT ATRIUM           Index LA diam:        3.60 cm 2.02 cm/m   RA Area:     11.30 cm LA Vol (A2C):   27.3 ml 15.35 ml/m  RA Volume:   21.70 ml  12.20 ml/m LA Vol (A4C):   33.5 ml 18.83 ml/m LA Biplane Vol: 31.3 ml 17.60 ml/m  AORTIC VALVE AV Area (Vmax):    2.10 cm AV Area (Vmean):   2.17 cm AV Area (VTI):     2.06 cm AV Vmax:           221.60 cm/s AV Vmean:          136.000 cm/s AV VTI:            0.362 m AV Peak Grad:      19.6 mmHg AV Mean Grad:      8.8 mmHg LVOT Vmax:         164.00 cm/s LVOT Vmean:        104.000 cm/s LVOT VTI:          0.263 m LVOT/AV VTI ratio: 0.73  AORTA Ao Root diam: 2.50 cm Ao Asc diam:  2.90 cm MITRAL VALVE MV Area (PHT): 3.27 cm     SHUNTS MV Decel Time: 232 msec     Systemic VTI:  0.26 m MV E velocity: 108.00 cm/s  Systemic Diam: 1.90 cm MV A velocity: 177.00  cm/s MV E/A ratio:  0.61 Toribio Fuel MD Electronically signed by Toribio Fuel MD Signature Date/Time: 07/14/2023/11:08:02 AM    Final    CT Angio Chest/Abd/Pel for Dissection W and/or Wo Contrast Result Date: 07/13/2023 CLINICAL DATA:  Acute aortic syndrome (AAS) suspected. History of multiple myeloma EXAM: CT ANGIOGRAPHY CHEST, ABDOMEN AND PELVIS  TECHNIQUE: Non-contrast CT of the chest was initially obtained. Multidetector CT imaging through the chest, abdomen and pelvis was performed using the standard protocol during bolus administration of intravenous contrast. Multiplanar reconstructed images and MIPs were obtained and reviewed to evaluate the vascular anatomy. RADIATION DOSE REDUCTION: This exam was performed according to the departmental dose-optimization program which includes automated exposure control, adjustment of the mA and/or kV according to patient size and/or use of iterative reconstruction technique. CONTRAST:  75mL OMNIPAQUE  IOHEXOL  350 MG/ML SOLN COMPARISON:  01/27/2023, 10/31/2020 FINDINGS: CTA CHEST FINDINGS Cardiovascular: Noncontrast CT of the chest demonstrates no evidence of a thoracic aortic intramural hematoma. Adequate opacification of the thoracic aorta. No thoracic aortic aneurysm or dissection. Three vessel arch with widely patent arch vessels. Aortic and coronary artery atherosclerosis. Central pulmonary vasculature is also well opacified. No central pulmonary arterial filling defects. Reflux of contrast into the IVC and hepatic veins. Heart size is normal. No significant pericardial effusion. Right IJ chest port terminates at the level of the mid SVC. Mediastinum/Nodes: Mildly enlarged subcarinal lymph nodes measuring up to 10 mm short axis (series 10, image 76). No axillary or hilar lymphadenopathy. Thyroid  gland, trachea, and esophagus within normal limits. Lungs/Pleura: Mild emphysema. No focal airspace consolidation. No pleural effusion. No pneumothorax. Musculoskeletal: No chest wall abnormality. No acute or significant osseous findings. Review of the MIP images confirms the above findings. CTA ABDOMEN AND PELVIS FINDINGS VASCULAR Aorta: Normal caliber aorta without aneurysm, dissection, vasculitis or significant stenosis. Mild atherosclerosis. Celiac: Patent without evidence of aneurysm, dissection, vasculitis or  significant stenosis. SMA: Patent without evidence of aneurysm, dissection, vasculitis or significant stenosis. Renals: Both renal arteries are patent without evidence of aneurysm, dissection, vasculitis, fibromuscular dysplasia or significant stenosis. IMA: Patent. Inflow: Patent without evidence of aneurysm, dissection, vasculitis or significant stenosis. Veins: No obvious venous abnormality within the limitations of this arterial phase study. Review of the MIP images confirms the above findings. NON-VASCULAR Hepatobiliary: No focal liver abnormality is seen. No gallstones, gallbladder wall thickening, or biliary dilatation. Pancreas: Unremarkable. No pancreatic ductal dilatation or surrounding inflammatory changes. Spleen: Normal in size without focal abnormality. Adrenals/Urinary Tract: Adrenal glands are unremarkable. Kidneys are normal, without renal calculi, focal lesion, or hydronephrosis. Bladder is unremarkable. Stomach/Bowel: Stomach is within normal limits. Appendix appears normal. Colonic diverticulosis. No evidence of bowel wall thickening, distention, or inflammatory changes. Lymphatic: No abdominopelvic lymphadenopathy. Reproductive: Status post hysterectomy. No adnexal masses. Other: No ascites or pneumoperitoneum. Musculoskeletal: No significant change in appearance of scattered lucent lesions including dominant lesions in the posterior left acetabulum and L5 vertebral body. No new lesions are identified. No pathologic fractures. Review of the MIP images confirms the above findings. IMPRESSION: 1. No evidence of acute aortic syndrome. 2. Aortic and coronary artery atherosclerosis (ICD10-I70.0). 3. Reflux of contrast into the IVC and hepatic veins, which can be seen in the setting of right heart dysfunction. 4. Mildly enlarged subcarinal lymph nodes, nonspecific. 5. Colonic diverticulosis without evidence of acute diverticulitis. 6. No significant change in appearance of scattered lucent lesions  including dominant lesions in the posterior left acetabulum and L5 vertebral body. No new lesions are identified. No pathologic fractures.  7. Mild emphysema (ICD10-J43.9). Electronically Signed   By: Mabel Converse D.O.   On: 07/13/2023 17:19   CT HEAD WO CONTRAST ( ) Result Date: 07/13/2023 CLINICAL DATA:  Headache, increasing frequency or severity EXAM: CT HEAD WITHOUT CONTRAST TECHNIQUE: Contiguous axial images were obtained from the base of the skull through the vertex without intravenous contrast. RADIATION DOSE REDUCTION: This exam was performed according to the departmental dose-optimization program which includes automated exposure control, adjustment of the mA and/or kV according to patient size and/or use of iterative reconstruction technique. COMPARISON:  CT head 03/17/2021. FINDINGS: Brain: No evidence of acute infarction, hemorrhage, hydrocephalus, extra-axial collection or mass lesion/mass effect. Partially empty sella. Vascular: No hyperdense vessel. Skull: Normal. Negative for fracture or focal lesion. Sinuses/Orbits: No acute finding. Other: No mastoid effusions. IMPRESSION: No evidence of acute intracranial abnormality. Electronically Signed   By: Gilmore GORMAN Molt M.D.   On: 07/13/2023 16:44   DG Chest Port 1 View Result Date: 07/13/2023 CLINICAL DATA:  near syncope EXAM: PORTABLE CHEST - 1 VIEW COMPARISON:  03/16/2023. FINDINGS: Cardiac silhouette is unremarkable. No pneumothorax or pleural effusion. The lungs are clear. Aorta is calcified. There are thoracic degenerative changes. Right-sided Port-A-Cath tip overlies mid SVC. IMPRESSION: No acute cardiopulmonary process. Electronically Signed   By: Fonda Field M.D.   On: 07/13/2023 14:22    Physical Exam: BP 130/74   Pulse 84   Ht 5' (1.524 m)   Wt 188 lb 6.4 oz (85.5 kg)   SpO2 97%   BMI 36.79 kg/m  Constitutional: Pleasant,well-developed, African-American female in no acute distress.  Uses wheeled walker for  ambulation. HEENT: Normocephalic and atraumatic. Conjunctivae are normal. No scleral icterus. Neck supple.  Cardiovascular: Normal rate, regular rhythm.  Pulmonary/chest: Effort normal and breath sounds normal. No wheezing, rales or rhonchi. Abdominal: Soft, obese nondistended, tenderness to palpation in the epigastrium.  Pain was improved with flexion of the abdominal muscles (Carnett's sign negative) bowel sounds active throughout. There are no masses palpable. No hepatomegaly. Extremities: no edema Neurological: Alert and oriented to person place and time. Skin: Skin is warm and dry. No rashes noted. Psychiatric: Normal mood and affect. Behavior is normal.  CBC    Component Value Date/Time   WBC 6.4 07/18/2023 1031   WBC 7.5 07/13/2023 1511   RBC 3.03 (L) 07/18/2023 1031   HGB 9.6 (L) 07/18/2023 1031   HGB 9.7 (L) 07/13/2015 1516   HCT 28.6 (L) 07/18/2023 1031   HCT 30.2 (L) 07/13/2015 1516   PLT 334 07/18/2023 1031   PLT 317 07/13/2015 1516   MCV 94.4 07/18/2023 1031   MCV 86.8 07/13/2015 1516   MCH 31.7 07/18/2023 1031   MCHC 33.6 07/18/2023 1031   RDW 13.0 07/18/2023 1031   RDW 14.5 07/13/2015 1516   LYMPHSABS 2.3 07/18/2023 1031   LYMPHSABS 3.7 (H) 07/13/2015 1516   MONOABS 0.5 07/18/2023 1031   MONOABS 0.5 07/13/2015 1516   EOSABS 0.1 07/18/2023 1031   EOSABS 0.1 07/13/2015 1516   BASOSABS 0.0 07/18/2023 1031   BASOSABS 0.0 07/13/2015 1516    CMP     Component Value Date/Time   NA 142 07/18/2023 1031   NA 139 07/13/2015 1516   K 3.8 07/18/2023 1031   K 3.9 07/13/2015 1516   CL 107 07/18/2023 1031   CO2 26 07/18/2023 1031   CO2 25 07/13/2015 1516   GLUCOSE 117 (H) 07/18/2023 1031   GLUCOSE 85 07/13/2015 1516   BUN 12 07/18/2023 1031   BUN  7.9 07/13/2015 1516   CREATININE 1.24 (H) 07/18/2023 1031   CREATININE 0.9 07/13/2015 1516   CALCIUM  9.0 07/18/2023 1031   CALCIUM  8.9 07/13/2015 1516   PROT 6.1 (L) 07/18/2023 1031   PROT 7.6 07/13/2015 1516    ALBUMIN 4.0 07/18/2023 1031   ALBUMIN 3.3 (L) 07/13/2015 1516   AST 11 (L) 07/18/2023 1031   AST 16 07/13/2015 1516   ALT 10 07/18/2023 1031   ALT 12 07/13/2015 1516   ALKPHOS 71 07/18/2023 1031   ALKPHOS 99 07/13/2015 1516   BILITOT 0.3 07/18/2023 1031   BILITOT <0.30 07/13/2015 1516   GFRNONAA 44 (L) 07/18/2023 1031   GFRNONAA 61 05/10/2014 1147   GFRAA 41 (L) 04/21/2020 1024   GFRAA 70 05/10/2014 1147       Latest Ref Rng & Units 07/18/2023   10:31 AM 07/13/2023    3:11 PM 07/04/2023    9:20 AM  CBC EXTENDED  WBC 4.0 - 10.5 K/uL 6.4  7.5  5.5   RBC 3.87 - 5.11 MIL/uL 3.03  3.14  2.88   Hemoglobin 12.0 - 15.0 g/dL 9.6  9.9  9.3   HCT 63.9 - 46.0 % 28.6  30.1  27.7   Platelets 150 - 400 K/uL 334  326  258   NEUT# 1.7 - 7.7 K/uL 3.5   3.1   Lymph# 0.7 - 4.0 K/uL 2.3   1.9       ASSESSMENT AND PLAN:  78 year old female with multiple comorbidities as described above, with persistent epigastric pain of unclear etiology.  Although the patient reports having this pain for only 1 month, she has several other encounters and evaluations over the years for chronic upper GI symptoms.  She has had a negative H. pylori test and negative CT A.  Liver enzymes and lipase unremarkable.  Given patient's history of diabetes, as well as history of radiation, gastroparesis could potentially explain her symptoms. Discussed gastroparesis and rationale for gastric emptying study. Explained dietary modifications as first-line treatment if confirmed. Discussed metoclopramide , including risks of tardive dyskinesia with long-term use. Diabetes and smoking history also place her at risk for pancreatic insufficiency.  Will check fecal elastase.  Low suspicion for underlying inflammatory bowel disease or celiac, but will get TTG/IgA, CRP and lactoferrin. In the meantime I recommended she try taking some IBgard as a low risk intervention that may help with some of her upper abdominal discomfort. - Order  gastric emptying study - Order stool test for fecal elastase, CRP, lactoferrin - Order celiac disease screening - Recommend dietary modifications if gastroparesis is confirmed - Consider short-term trial of metoclopramide  if gastroparesis is confirmed - Recommend over-the-counter IBgard (peppermint oil) for symptom relief  Multiple Myeloma Undergoing monthly infusions for multiple myeloma. No new symptoms or changes in treatment. - Continue current treatment regimen per oncology    Ova Meegan E. Stacia, MD [facility]    Mahlon Comer BRAVO, MD

## 2023-07-31 NOTE — Telephone Encounter (Signed)
 Home Health Certification or Plan of Care Tracking  Is this a Certification or Plan of Care? Plan of Care  [facility] Agency: [facility]  Order Number:  [id number]  Has charge sheet been attached? Yes  Where has form been placed:   Labeled & placed in provider bin  **Possible Duplicate**

## 2023-08-01 ENCOUNTER — Other Ambulatory Visit: Payer: Self-pay

## 2023-08-01 DIAGNOSIS — G2581 Restless legs syndrome: Secondary | ICD-10-CM | POA: Diagnosis not present

## 2023-08-01 DIAGNOSIS — E1122 Type 2 diabetes mellitus with diabetic chronic kidney disease: Secondary | ICD-10-CM | POA: Diagnosis not present

## 2023-08-01 DIAGNOSIS — I088 Other rheumatic multiple valve diseases: Secondary | ICD-10-CM | POA: Diagnosis not present

## 2023-08-01 DIAGNOSIS — R42 Dizziness and giddiness: Secondary | ICD-10-CM | POA: Diagnosis not present

## 2023-08-01 DIAGNOSIS — Z853 Personal history of malignant neoplasm of breast: Secondary | ICD-10-CM | POA: Diagnosis not present

## 2023-08-01 DIAGNOSIS — N1831 Chronic kidney disease, stage 3a: Secondary | ICD-10-CM | POA: Diagnosis not present

## 2023-08-01 DIAGNOSIS — Z87891 Personal history of nicotine dependence: Secondary | ICD-10-CM | POA: Diagnosis not present

## 2023-08-01 DIAGNOSIS — J42 Unspecified chronic bronchitis: Secondary | ICD-10-CM | POA: Diagnosis not present

## 2023-08-01 DIAGNOSIS — I2510 Atherosclerotic heart disease of native coronary artery without angina pectoris: Secondary | ICD-10-CM | POA: Diagnosis not present

## 2023-08-01 DIAGNOSIS — I1310 Hypertensive heart and chronic kidney disease without heart failure, with stage 1 through stage 4 chronic kidney disease, or unspecified chronic kidney disease: Secondary | ICD-10-CM | POA: Diagnosis not present

## 2023-08-01 DIAGNOSIS — F419 Anxiety disorder, unspecified: Secondary | ICD-10-CM

## 2023-08-01 DIAGNOSIS — I700 Atherosclerosis of aorta: Secondary | ICD-10-CM | POA: Diagnosis not present

## 2023-08-01 LAB — MULTIPLE MYELOMA PANEL, SERUM
Albumin SerPl Elph-Mcnc: 3.3 g/dL (ref 2.9–4.4)
Albumin/Glob SerPl: 1.3 (ref 0.7–1.7)
Alpha 1: 0.2 g/dL (ref 0.0–0.4)
Alpha2 Glob SerPl Elph-Mcnc: 1.3 g/dL — ABNORMAL HIGH (ref 0.4–1.0)
B-Globulin SerPl Elph-Mcnc: 0.9 g/dL (ref 0.7–1.3)
Gamma Glob SerPl Elph-Mcnc: 0.4 g/dL (ref 0.4–1.8)
Globulin, Total: 2.7 g/dL (ref 2.2–3.9)
IgA: 62 mg/dL — ABNORMAL LOW (ref 64–422)
IgG (Immunoglobin G), Serum: 385 mg/dL — ABNORMAL LOW (ref 586–1602)
IgM (Immunoglobulin M), Srm: 40 mg/dL (ref 26–217)
M Protein SerPl Elph-Mcnc: 0.1 g/dL — ABNORMAL HIGH
Total Protein ELP: 6 g/dL (ref 6.0–8.5)

## 2023-08-01 LAB — IGA: Immunoglobulin A: 67 mg/dL — ABNORMAL LOW (ref 70–320)

## 2023-08-01 LAB — TISSUE TRANSGLUTAMINASE, IGA: (tTG) Ab, IgA: <1 U/mL

## 2023-08-01 NOTE — Telephone Encounter (Signed)
 Faxed placed in bin to be scanned

## 2023-08-01 NOTE — Telephone Encounter (Signed)
 Signed and returned to [place]

## 2023-08-01 NOTE — Telephone Encounter (Signed)
 Forms signed and returned to [place]

## 2023-08-01 NOTE — Telephone Encounter (Signed)
 Faxed and placed in bin to be scanned

## 2023-08-01 NOTE — Telephone Encounter (Signed)
 Faxed

## 2023-08-01 NOTE — Telephone Encounter (Signed)
 Duplicate.  shredded

## 2023-08-04 ENCOUNTER — Ambulatory Visit: Payer: Self-pay

## 2023-08-04 DIAGNOSIS — E1122 Type 2 diabetes mellitus with diabetic chronic kidney disease: Secondary | ICD-10-CM | POA: Diagnosis not present

## 2023-08-04 DIAGNOSIS — I700 Atherosclerosis of aorta: Secondary | ICD-10-CM | POA: Diagnosis not present

## 2023-08-04 DIAGNOSIS — N1831 Chronic kidney disease, stage 3a: Secondary | ICD-10-CM | POA: Diagnosis not present

## 2023-08-04 DIAGNOSIS — I1310 Hypertensive heart and chronic kidney disease without heart failure, with stage 1 through stage 4 chronic kidney disease, or unspecified chronic kidney disease: Secondary | ICD-10-CM | POA: Diagnosis not present

## 2023-08-04 DIAGNOSIS — I2510 Atherosclerotic heart disease of native coronary artery without angina pectoris: Secondary | ICD-10-CM | POA: Diagnosis not present

## 2023-08-04 DIAGNOSIS — I088 Other rheumatic multiple valve diseases: Secondary | ICD-10-CM | POA: Diagnosis not present

## 2023-08-04 NOTE — Patient Instructions (Signed)
 Visit Information  Thank you for taking time to visit with me today. Please don't hesitate to contact me if I can be of assistance to you.   Following are the goals we discussed today:  [facility] to discuss [facility] and food options. Patient will contact [facility] for a list of in network providers.   Our next appointment is by telephone on 08/19/23 at 11am  Please call the care guide team at [phone] if you need to cancel or reschedule your appointment.   If you are experiencing a Mental Health or Behavioral Health Crisis or need someone to talk to, please call 911  Patient verbalizes understanding of instructions and care plan provided today and agrees to view in MyChart. Active MyChart status and patient understanding of how to access instructions and care plan via MyChart confirmed with patient.     Telephone follow up appointment with care management team member scheduled for:08/19/23 at 11am  Tillman Gardener, BSW Social Worker  [phone]

## 2023-08-04 NOTE — Progress Notes (Signed)
 Ms. Edinger,  Your labs were unremarkable.  Await stool test results.
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
## 2023-08-05 DIAGNOSIS — I088 Other rheumatic multiple valve diseases: Secondary | ICD-10-CM | POA: Diagnosis not present

## 2023-08-05 DIAGNOSIS — I2510 Atherosclerotic heart disease of native coronary artery without angina pectoris: Secondary | ICD-10-CM | POA: Diagnosis not present

## 2023-08-05 DIAGNOSIS — I1310 Hypertensive heart and chronic kidney disease without heart failure, with stage 1 through stage 4 chronic kidney disease, or unspecified chronic kidney disease: Secondary | ICD-10-CM | POA: Diagnosis not present

## 2023-08-05 DIAGNOSIS — E1122 Type 2 diabetes mellitus with diabetic chronic kidney disease: Secondary | ICD-10-CM | POA: Diagnosis not present

## 2023-08-05 DIAGNOSIS — I700 Atherosclerosis of aorta: Secondary | ICD-10-CM | POA: Diagnosis not present

## 2023-08-05 DIAGNOSIS — N1831 Chronic kidney disease, stage 3a: Secondary | ICD-10-CM | POA: Diagnosis not present

## 2023-08-06 DIAGNOSIS — I1310 Hypertensive heart and chronic kidney disease without heart failure, with stage 1 through stage 4 chronic kidney disease, or unspecified chronic kidney disease: Secondary | ICD-10-CM | POA: Diagnosis not present

## 2023-08-06 DIAGNOSIS — E1122 Type 2 diabetes mellitus with diabetic chronic kidney disease: Secondary | ICD-10-CM | POA: Diagnosis not present

## 2023-08-06 DIAGNOSIS — I2510 Atherosclerotic heart disease of native coronary artery without angina pectoris: Secondary | ICD-10-CM | POA: Diagnosis not present

## 2023-08-06 DIAGNOSIS — I088 Other rheumatic multiple valve diseases: Secondary | ICD-10-CM | POA: Diagnosis not present

## 2023-08-06 DIAGNOSIS — N1831 Chronic kidney disease, stage 3a: Secondary | ICD-10-CM | POA: Diagnosis not present

## 2023-08-06 DIAGNOSIS — I700 Atherosclerosis of aorta: Secondary | ICD-10-CM | POA: Diagnosis not present

## 2023-08-07 ENCOUNTER — Telehealth: Payer: Self-pay | Admitting: Family Medicine

## 2023-08-07 NOTE — Telephone Encounter (Signed)
 Faxed and placed in scan bin

## 2023-08-07 NOTE — Telephone Encounter (Signed)
 Home Health Verbal Orders  Agency: [facility]   Caller: [phone] Call back #: [phone] Fax #:    Requesting SN:    Placed in [employer] sheet attached

## 2023-08-07 NOTE — Telephone Encounter (Signed)
 Forms completed and returned to [place]

## 2023-08-07 NOTE — Telephone Encounter (Signed)
 Placed in your sign folder

## 2023-08-12 DIAGNOSIS — I1310 Hypertensive heart and chronic kidney disease without heart failure, with stage 1 through stage 4 chronic kidney disease, or unspecified chronic kidney disease: Secondary | ICD-10-CM | POA: Diagnosis not present

## 2023-08-12 DIAGNOSIS — N1831 Chronic kidney disease, stage 3a: Secondary | ICD-10-CM | POA: Diagnosis not present

## 2023-08-12 DIAGNOSIS — E1122 Type 2 diabetes mellitus with diabetic chronic kidney disease: Secondary | ICD-10-CM | POA: Diagnosis not present

## 2023-08-12 DIAGNOSIS — I088 Other rheumatic multiple valve diseases: Secondary | ICD-10-CM | POA: Diagnosis not present

## 2023-08-12 DIAGNOSIS — I2510 Atherosclerotic heart disease of native coronary artery without angina pectoris: Secondary | ICD-10-CM | POA: Diagnosis not present

## 2023-08-12 DIAGNOSIS — I700 Atherosclerosis of aorta: Secondary | ICD-10-CM | POA: Diagnosis not present

## 2023-08-13 DIAGNOSIS — I2510 Atherosclerotic heart disease of native coronary artery without angina pectoris: Secondary | ICD-10-CM | POA: Diagnosis not present

## 2023-08-13 DIAGNOSIS — I1310 Hypertensive heart and chronic kidney disease without heart failure, with stage 1 through stage 4 chronic kidney disease, or unspecified chronic kidney disease: Secondary | ICD-10-CM | POA: Diagnosis not present

## 2023-08-13 DIAGNOSIS — N1831 Chronic kidney disease, stage 3a: Secondary | ICD-10-CM | POA: Diagnosis not present

## 2023-08-13 DIAGNOSIS — I700 Atherosclerosis of aorta: Secondary | ICD-10-CM | POA: Diagnosis not present

## 2023-08-13 DIAGNOSIS — E1122 Type 2 diabetes mellitus with diabetic chronic kidney disease: Secondary | ICD-10-CM | POA: Diagnosis not present

## 2023-08-13 DIAGNOSIS — I088 Other rheumatic multiple valve diseases: Secondary | ICD-10-CM | POA: Diagnosis not present

## 2023-08-15 ENCOUNTER — Inpatient Hospital Stay: Payer: Medicare Other

## 2023-08-15 ENCOUNTER — Inpatient Hospital Stay: Payer: Medicare Other | Attending: Hematology

## 2023-08-15 VITALS — BP 157/73 | HR 86 | Temp 97.7°F | Resp 16 | Wt 186.8 lb

## 2023-08-15 DIAGNOSIS — C9000 Multiple myeloma not having achieved remission: Secondary | ICD-10-CM | POA: Diagnosis not present

## 2023-08-15 DIAGNOSIS — Z923 Personal history of irradiation: Secondary | ICD-10-CM | POA: Diagnosis not present

## 2023-08-15 DIAGNOSIS — Z5112 Encounter for antineoplastic immunotherapy: Secondary | ICD-10-CM | POA: Insufficient documentation

## 2023-08-15 DIAGNOSIS — N189 Chronic kidney disease, unspecified: Secondary | ICD-10-CM | POA: Diagnosis not present

## 2023-08-15 DIAGNOSIS — Z801 Family history of malignant neoplasm of trachea, bronchus and lung: Secondary | ICD-10-CM | POA: Diagnosis not present

## 2023-08-15 DIAGNOSIS — G629 Polyneuropathy, unspecified: Secondary | ICD-10-CM | POA: Diagnosis not present

## 2023-08-15 DIAGNOSIS — Z803 Family history of malignant neoplasm of breast: Secondary | ICD-10-CM | POA: Diagnosis not present

## 2023-08-15 DIAGNOSIS — E1140 Type 2 diabetes mellitus with diabetic neuropathy, unspecified: Secondary | ICD-10-CM | POA: Insufficient documentation

## 2023-08-15 DIAGNOSIS — I129 Hypertensive chronic kidney disease with stage 1 through stage 4 chronic kidney disease, or unspecified chronic kidney disease: Secondary | ICD-10-CM | POA: Insufficient documentation

## 2023-08-15 DIAGNOSIS — Z800 Family history of malignant neoplasm of digestive organs: Secondary | ICD-10-CM | POA: Insufficient documentation

## 2023-08-15 DIAGNOSIS — Z79899 Other long term (current) drug therapy: Secondary | ICD-10-CM | POA: Insufficient documentation

## 2023-08-15 DIAGNOSIS — Z853 Personal history of malignant neoplasm of breast: Secondary | ICD-10-CM | POA: Diagnosis not present

## 2023-08-15 DIAGNOSIS — G2581 Restless legs syndrome: Secondary | ICD-10-CM | POA: Insufficient documentation

## 2023-08-15 DIAGNOSIS — Z95828 Presence of other vascular implants and grafts: Secondary | ICD-10-CM

## 2023-08-15 DIAGNOSIS — Z7189 Other specified counseling: Secondary | ICD-10-CM

## 2023-08-15 DIAGNOSIS — R110 Nausea: Secondary | ICD-10-CM | POA: Insufficient documentation

## 2023-08-15 DIAGNOSIS — D509 Iron deficiency anemia, unspecified: Secondary | ICD-10-CM | POA: Diagnosis not present

## 2023-08-15 DIAGNOSIS — Z90710 Acquired absence of both cervix and uterus: Secondary | ICD-10-CM | POA: Insufficient documentation

## 2023-08-15 DIAGNOSIS — Z87891 Personal history of nicotine dependence: Secondary | ICD-10-CM | POA: Insufficient documentation

## 2023-08-15 DIAGNOSIS — E1122 Type 2 diabetes mellitus with diabetic chronic kidney disease: Secondary | ICD-10-CM | POA: Insufficient documentation

## 2023-08-15 DIAGNOSIS — I2510 Atherosclerotic heart disease of native coronary artery without angina pectoris: Secondary | ICD-10-CM | POA: Insufficient documentation

## 2023-08-15 DIAGNOSIS — I252 Old myocardial infarction: Secondary | ICD-10-CM | POA: Insufficient documentation

## 2023-08-15 DIAGNOSIS — E538 Deficiency of other specified B group vitamins: Secondary | ICD-10-CM | POA: Diagnosis not present

## 2023-08-15 DIAGNOSIS — C7951 Secondary malignant neoplasm of bone: Secondary | ICD-10-CM

## 2023-08-15 DIAGNOSIS — Z8673 Personal history of transient ischemic attack (TIA), and cerebral infarction without residual deficits: Secondary | ICD-10-CM | POA: Insufficient documentation

## 2023-08-15 DIAGNOSIS — G4733 Obstructive sleep apnea (adult) (pediatric): Secondary | ICD-10-CM | POA: Diagnosis not present

## 2023-08-15 LAB — CMP (CANCER CENTER ONLY)
ALT: 10 U/L (ref 0–44)
AST: 13 U/L — ABNORMAL LOW (ref 15–41)
Albumin: 4.1 g/dL (ref 3.5–5.0)
Alkaline Phosphatase: 70 U/L (ref 38–126)
Anion gap: 9 (ref 5–15)
BUN: 10 mg/dL (ref 8–23)
CO2: 24 mmol/L (ref 22–32)
Calcium: 9 mg/dL (ref 8.9–10.3)
Chloride: 111 mmol/L (ref 98–111)
Creatinine: 1.2 mg/dL — ABNORMAL HIGH (ref 0.44–1.00)
GFR, Estimated: 46 mL/min — ABNORMAL LOW (ref >60)
Glucose, Bld: 121 mg/dL — ABNORMAL HIGH (ref 70–99)
Potassium: 4 mmol/L (ref 3.5–5.1)
Sodium: 144 mmol/L (ref 135–145)
Total Bilirubin: 0.3 mg/dL (ref 0.0–1.2)
Total Protein: 6.5 g/dL (ref 6.5–8.1)

## 2023-08-15 LAB — CBC WITH DIFFERENTIAL (CANCER CENTER ONLY)
Abs Immature Granulocytes: 0.02 10*3/uL (ref 0.00–0.07)
Basophils Absolute: 0.1 10*3/uL (ref 0.0–0.1)
Basophils Relative: 1 %
Eosinophils Absolute: 0.1 10*3/uL (ref 0.0–0.5)
Eosinophils Relative: 2 %
HCT: 30.3 % — ABNORMAL LOW (ref 36.0–46.0)
Hemoglobin: 10.3 g/dL — ABNORMAL LOW (ref 12.0–15.0)
Immature Granulocytes: 0 %
Lymphocytes Relative: 35 %
Lymphs Abs: 2.3 10*3/uL (ref 0.7–4.0)
MCH: 31.4 pg (ref 26.0–34.0)
MCHC: 34 g/dL (ref 30.0–36.0)
MCV: 92.4 fL (ref 80.0–100.0)
Monocytes Absolute: 0.5 10*3/uL (ref 0.1–1.0)
Monocytes Relative: 7 %
Neutro Abs: 3.7 10*3/uL (ref 1.7–7.7)
Neutrophils Relative %: 55 %
Platelet Count: 299 10*3/uL (ref 150–400)
RBC: 3.28 MIL/uL — ABNORMAL LOW (ref 3.87–5.11)
RDW: 12.9 % (ref 11.5–15.5)
WBC Count: 6.6 10*3/uL (ref 4.0–10.5)
nRBC: 0 % (ref 0.0–0.2)

## 2023-08-15 MED ORDER — DIPHENHYDRAMINE HCL 25 MG PO CAPS
50.0000 mg | ORAL_CAPSULE | Freq: Once | ORAL | Status: AC
  Administered 2023-08-15: 50 mg via ORAL
  Filled 2023-08-15: qty 2

## 2023-08-15 MED ORDER — SODIUM CHLORIDE 0.9% FLUSH
10.0000 mL | INTRAVENOUS | Status: DC | PRN
  Administered 2023-08-15: 10 mL

## 2023-08-15 MED ORDER — SODIUM CHLORIDE 0.9 % IV SOLN: Freq: Once | INTRAVENOUS | Status: AC

## 2023-08-15 MED ORDER — FAMOTIDINE IN NACL 20-0.9 MG/50ML-% IV SOLN
20.0000 mg | Freq: Once | INTRAVENOUS | Status: AC
Start: 2023-08-15 — End: 2023-08-15
  Administered 2023-08-15: 20 mg via INTRAVENOUS
  Filled 2023-08-15: qty 50

## 2023-08-15 MED ORDER — SODIUM CHLORIDE 0.9 % IV SOLN
16.0000 mg | Freq: Once | INTRAVENOUS | Status: AC
  Administered 2023-08-15: 16 mg via INTRAVENOUS
  Filled 2023-08-15: qty 1.6

## 2023-08-15 MED ORDER — HEPARIN SOD (PORK) LOCK FLUSH 100 UNIT/ML IV SOLN
500.0000 [IU] | Freq: Once | INTRAVENOUS | Status: AC | PRN
Start: 2023-08-15 — End: 2023-08-15
  Administered 2023-08-15: 500 [IU]

## 2023-08-15 MED ORDER — SODIUM CHLORIDE 0.9 % IV SOLN
16.0000 mg/kg | Freq: Once | INTRAVENOUS | Status: AC
  Administered 2023-08-15: 1300 mg via INTRAVENOUS
  Filled 2023-08-15: qty 5

## 2023-08-15 MED ORDER — SODIUM CHLORIDE 0.9% FLUSH
10.0000 mL | Freq: Once | INTRAVENOUS | Status: AC
  Administered 2023-08-15: 10 mL

## 2023-08-15 MED ORDER — ACETAMINOPHEN 325 MG PO TABS
650.0000 mg | ORAL_TABLET | Freq: Once | ORAL | Status: AC
  Administered 2023-08-15: 650 mg via ORAL
  Filled 2023-08-15: qty 2

## 2023-08-15 NOTE — Patient Instructions (Signed)
 [facility] - A DEPT OF [facility]   Discharge Instructions: Thank you for choosing [facility] to provide your oncology and hematology care.   If you have a lab appointment with the Cancer Center, please go directly to the Cancer Center and check in at the registration area.   Wear comfortable clothing and clothing appropriate for easy access to any Portacath or PICC line.   We strive to give you quality time with your provider. You may need to reschedule your appointment if you arrive late (15 or more minutes).  Arriving late affects you and other patients whose appointments are after yours.  Also, if you miss three or more appointments without notifying the office, you may be dismissed from the clinic at the provider's discretion.      For prescription refill requests, have your pharmacy contact our office and allow 72 hours for refills to be completed.    Today you received the following chemotherapy and/or immunotherapy agents: Daratumumab (Darzalex)      To help prevent nausea and vomiting after your treatment, we encourage you to take your nausea medication as directed.  BELOW ARE SYMPTOMS THAT SHOULD BE REPORTED IMMEDIATELY: *FEVER GREATER THAN 100.4 F (38 C) OR HIGHER *CHILLS OR SWEATING *NAUSEA AND VOMITING THAT IS NOT CONTROLLED WITH YOUR NAUSEA MEDICATION *UNUSUAL SHORTNESS OF BREATH *UNUSUAL BRUISING OR BLEEDING *URINARY PROBLEMS (pain or burning when urinating, or frequent urination) *BOWEL PROBLEMS (unusual diarrhea, constipation, pain near the anus) TENDERNESS IN MOUTH AND THROAT WITH OR WITHOUT PRESENCE OF ULCERS (sore throat, sores in mouth, or a toothache) UNUSUAL RASH, SWELLING OR PAIN  UNUSUAL VAGINAL DISCHARGE OR ITCHING   Items with * indicate a potential emergency and should be followed up as soon as possible or go to the Emergency Department if any problems should occur.  Please show the CHEMOTHERAPY ALERT CARD or  IMMUNOTHERAPY ALERT CARD at check-in to the Emergency Department and triage nurse.  Should you have questions after your visit or need to cancel or reschedule your appointment, please contact [facility] - A DEPT OF [facility]  Dept: [phone]  and follow the prompts.  Office hours are 8:00 a.m. to 4:30 p.m. Monday - Friday. Please note that voicemails left after 4:00 p.m. may not be returned until the following business day.  We are closed weekends and major holidays. You have access to a nurse at all times for urgent questions. Please call the main number to the clinic Dept: [phone] and follow the prompts.   For any non-urgent questions, you may also contact your provider using MyChart. We now offer e-Visits for anyone 76 and older to request care online for non-urgent symptoms. For details visit [email or web address].   Also download the MyChart app! Go to the app store, search "MyChart", open the app, select [place], and log in with your MyChart username and password.

## 2023-08-17 ENCOUNTER — Other Ambulatory Visit: Payer: Self-pay | Admitting: Hematology

## 2023-08-18 ENCOUNTER — Encounter: Payer: Self-pay | Admitting: Hematology

## 2023-08-18 ENCOUNTER — Encounter (HOSPITAL_COMMUNITY): Payer: Medicare Other

## 2023-08-18 ENCOUNTER — Inpatient Hospital Stay: Payer: Medicare Other

## 2023-08-19 ENCOUNTER — Ambulatory Visit: Payer: Self-pay

## 2023-08-19 ENCOUNTER — Telehealth: Payer: Self-pay | Admitting: Gastroenterology
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
## 2023-08-19 NOTE — Telephone Encounter (Signed)
FYI
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
## 2023-08-19 NOTE — Patient Instructions (Signed)
Visit Information  Thank you for taking time to visit with me today. Please don't hesitate to contact me if I can be of assistance to you.   Following are the goals we discussed today:  SW will submit SCAT application to provider for review. SW will submit completed A/B SCAT to [facility].   Our next appointment is by telephone on 09/18/23 at 1pm  Please call the care guide team at [phone] if you need to cancel or reschedule your appointment.   If you are experiencing a Mental Health or Behavioral Health Crisis or need someone to talk to, please call 911  Patient verbalizes understanding of instructions and care plan provided today and agrees to view in MyChart. Active MyChart status and patient understanding of how to access instructions and care plan via MyChart confirmed with patient.     Telephone follow up appointment with care management team member scheduled for:09/18/23 at 1pm.   Lysle Morales, BSW [place]  [facility], [facility] Social Worker Direct Dial: [phone]  Fax: [phone] Website: [email or web address]

## 2023-08-20 ENCOUNTER — Telehealth: Payer: Self-pay | Admitting: Family Medicine

## 2023-08-20 DIAGNOSIS — I1310 Hypertensive heart and chronic kidney disease without heart failure, with stage 1 through stage 4 chronic kidney disease, or unspecified chronic kidney disease: Secondary | ICD-10-CM | POA: Diagnosis not present

## 2023-08-20 DIAGNOSIS — I700 Atherosclerosis of aorta: Secondary | ICD-10-CM | POA: Diagnosis not present

## 2023-08-20 DIAGNOSIS — E1122 Type 2 diabetes mellitus with diabetic chronic kidney disease: Secondary | ICD-10-CM | POA: Diagnosis not present

## 2023-08-20 DIAGNOSIS — I2510 Atherosclerotic heart disease of native coronary artery without angina pectoris: Secondary | ICD-10-CM | POA: Diagnosis not present

## 2023-08-20 DIAGNOSIS — N1831 Chronic kidney disease, stage 3a: Secondary | ICD-10-CM | POA: Diagnosis not present

## 2023-08-20 DIAGNOSIS — I088 Other rheumatic multiple valve diseases: Secondary | ICD-10-CM | POA: Diagnosis not present

## 2023-08-20 NOTE — Telephone Encounter (Signed)
Placed in folder at nurse station

## 2023-08-20 NOTE — Telephone Encounter (Addendum)
Type of form received:[place] Value Based Case   Additional comments: Authorization Release information   Received by: Dollene Primrose Desk  Questions directed to: [phone]  Is patient requesting call for pickup:N/A  Form placed: [employer] charge sheet.  Provider will determine charge.N/A  Individual made aware of 3-5 business day turn around No?

## 2023-08-22 DIAGNOSIS — Z0279 Encounter for issue of other medical certificate: Secondary | ICD-10-CM

## 2023-08-22 LAB — MULTIPLE MYELOMA PANEL, SERUM
Albumin SerPl Elph-Mcnc: 3.4 g/dL (ref 2.9–4.4)
Albumin/Glob SerPl: 1.3 (ref 0.7–1.7)
Alpha 1: 0.2 g/dL (ref 0.0–0.4)
Alpha2 Glob SerPl Elph-Mcnc: 1.2 g/dL — ABNORMAL HIGH (ref 0.4–1.0)
B-Globulin SerPl Elph-Mcnc: 0.9 g/dL (ref 0.7–1.3)
Gamma Glob SerPl Elph-Mcnc: 0.4 g/dL (ref 0.4–1.8)
Globulin, Total: 2.7 g/dL (ref 2.2–3.9)
IgA: 70 mg/dL (ref 64–422)
IgG (Immunoglobin G), Serum: 416 mg/dL — ABNORMAL LOW (ref 586–1602)
IgM (Immunoglobulin M), Srm: 37 mg/dL (ref 26–217)
M Protein SerPl Elph-Mcnc: 0.2 g/dL — ABNORMAL HIGH
Total Protein ELP: 6.1 g/dL (ref 6.0–8.5)

## 2023-08-22 NOTE — Telephone Encounter (Signed)
Faxed

## 2023-08-22 NOTE — Telephone Encounter (Signed)
Signed and returned to [place]

## 2023-09-02 ENCOUNTER — Ambulatory Visit: Payer: Self-pay | Admitting: Licensed Clinical Social Worker

## 2023-09-02 ENCOUNTER — Telehealth: Payer: Self-pay | Admitting: Family Medicine

## 2023-09-02 NOTE — Patient Instructions (Addendum)
Visit Information  Thank you for taking time to visit with me today. Please don't hesitate to contact me if I can be of assistance to you.   Following are the goals we discussed today:   Goals Addressed             This Visit's Progress    LCSW-Plan of Care   On track    Activities and task to complete in order to accomplish goals.   Keep all upcoming appointments discussed today Continue with compliance of taking medication prescribed by Doctor Implement healthy coping skills discussed to assist with management of symptoms           Our next appointment is by telephone on 3/18 at 12:30 PM  Please call the care guide team at [phone] if you need to cancel or reschedule your appointment.   If you are experiencing a Mental Health or Behavioral Health Crisis or need someone to talk to, please call the Suicide and Crisis Lifeline: 988 call 911   Patient verbalizes understanding of instructions and care plan provided today and agrees to view in MyChart. Active MyChart status and patient understanding of how to access instructions and care plan via MyChart confirmed with patient.     Windy Fast [facility]  [facility], [facility] Clinical Social Worker Direct Dial: [phone]  Fax: [phone] Website: [email or web address] 1:08 PM

## 2023-09-02 NOTE — Telephone Encounter (Addendum)
Home Health   Agency: [facility]   Caller: PTA: Greggory Keen  Call back #: [phone] Fax #:[phone]    Requesting PTA:    Reason for Request:  Missed Visits     08/29/2023  Placed in [place]

## 2023-09-02 NOTE — Patient Outreach (Signed)
  Care Coordination   Follow Up Visit Note   09/02/2023 Name: Tracy [name] MRN: [id number] DOB: [birth year]  Tracy [name] is a 78 y.o. year old female who sees [name], Comer BRAVO, MD for primary care. I spoke with  Tracy [name] by phone today.  What matters to the patients health and wellness today?  Symptom Management     Goals Addressed             This Visit's Progress    LCSW-Plan of Care   On track    Activities and task to complete in order to accomplish goals.   Keep all upcoming appointments discussed today Continue with compliance of taking medication prescribed by Doctor Implement healthy coping skills discussed to assist with management of symptoms           SDOH assessments and interventions completed:  No     Care Coordination Interventions:  Yes, provided  Interventions Today    Flowsheet Row Most Recent Value  Chronic Disease   Chronic disease during today's visit Hypertension (HTN), Diabetes, Other  [GAD, Depression]  General Interventions   General Interventions Discussed/Reviewed General Interventions Reviewed, Doctor Visits, Community Resources  [Pt hasnt received correspondence from [facility]]  Doctor Visits Discussed/Reviewed Doctor Visits Reviewed  Mental Health Interventions   Mental Health Discussed/Reviewed Mental Health Reviewed, Coping Strategies, Anxiety, Depression  [Patient identified things she has not been doing that she enjoys. Strategies discussed to assist with promoting positive mood and motivation]  Nutrition Interventions   Nutrition Discussed/Reviewed Nutrition Reviewed  Pharmacy Interventions   Pharmacy Dicussed/Reviewed Pharmacy Topics Reviewed, Medication Adherence  Safety Interventions   Safety Discussed/Reviewed Safety Reviewed       Follow up plan: Follow up call scheduled for 4-6 weeks    Encounter Outcome:  Patient Visit Completed   Rolin Kerns, LCSW [place]  [facility], [facility] Clinical Social Worker Direct Dial: [phone]  Fax: [phone] Website: [email or web address] 12:56 PM

## 2023-09-03 DIAGNOSIS — I1310 Hypertensive heart and chronic kidney disease without heart failure, with stage 1 through stage 4 chronic kidney disease, or unspecified chronic kidney disease: Secondary | ICD-10-CM | POA: Diagnosis not present

## 2023-09-03 DIAGNOSIS — I2510 Atherosclerotic heart disease of native coronary artery without angina pectoris: Secondary | ICD-10-CM | POA: Diagnosis not present

## 2023-09-03 DIAGNOSIS — I088 Other rheumatic multiple valve diseases: Secondary | ICD-10-CM | POA: Diagnosis not present

## 2023-09-03 DIAGNOSIS — N1831 Chronic kidney disease, stage 3a: Secondary | ICD-10-CM | POA: Diagnosis not present

## 2023-09-03 DIAGNOSIS — I700 Atherosclerosis of aorta: Secondary | ICD-10-CM | POA: Diagnosis not present

## 2023-09-03 DIAGNOSIS — E1122 Type 2 diabetes mellitus with diabetic chronic kidney disease: Secondary | ICD-10-CM | POA: Diagnosis not present

## 2023-09-12 ENCOUNTER — Other Ambulatory Visit: Payer: Medicare Other

## 2023-09-12 ENCOUNTER — Inpatient Hospital Stay: Payer: Medicare Other

## 2023-09-12 ENCOUNTER — Inpatient Hospital Stay: Payer: Medicare Other | Attending: Hematology

## 2023-09-12 ENCOUNTER — Inpatient Hospital Stay (HOSPITAL_BASED_OUTPATIENT_CLINIC_OR_DEPARTMENT_OTHER): Payer: Medicare Other | Admitting: Hematology

## 2023-09-12 VITALS — BP 158/58 | HR 72 | Temp 98.2°F | Resp 18

## 2023-09-12 VITALS — BP 164/84 | HR 79 | Temp 97.3°F | Resp 17 | Wt 187.5 lb

## 2023-09-12 DIAGNOSIS — E538 Deficiency of other specified B group vitamins: Secondary | ICD-10-CM | POA: Diagnosis not present

## 2023-09-12 DIAGNOSIS — Z801 Family history of malignant neoplasm of trachea, bronchus and lung: Secondary | ICD-10-CM | POA: Insufficient documentation

## 2023-09-12 DIAGNOSIS — I252 Old myocardial infarction: Secondary | ICD-10-CM | POA: Diagnosis not present

## 2023-09-12 DIAGNOSIS — C9000 Multiple myeloma not having achieved remission: Secondary | ICD-10-CM | POA: Insufficient documentation

## 2023-09-12 DIAGNOSIS — I2510 Atherosclerotic heart disease of native coronary artery without angina pectoris: Secondary | ICD-10-CM | POA: Insufficient documentation

## 2023-09-12 DIAGNOSIS — Z79899 Other long term (current) drug therapy: Secondary | ICD-10-CM | POA: Insufficient documentation

## 2023-09-12 DIAGNOSIS — Z803 Family history of malignant neoplasm of breast: Secondary | ICD-10-CM | POA: Insufficient documentation

## 2023-09-12 DIAGNOSIS — Z800 Family history of malignant neoplasm of digestive organs: Secondary | ICD-10-CM | POA: Insufficient documentation

## 2023-09-12 DIAGNOSIS — E1140 Type 2 diabetes mellitus with diabetic neuropathy, unspecified: Secondary | ICD-10-CM | POA: Insufficient documentation

## 2023-09-12 DIAGNOSIS — I129 Hypertensive chronic kidney disease with stage 1 through stage 4 chronic kidney disease, or unspecified chronic kidney disease: Secondary | ICD-10-CM | POA: Insufficient documentation

## 2023-09-12 DIAGNOSIS — Z923 Personal history of irradiation: Secondary | ICD-10-CM | POA: Insufficient documentation

## 2023-09-12 DIAGNOSIS — C7951 Secondary malignant neoplasm of bone: Secondary | ICD-10-CM

## 2023-09-12 DIAGNOSIS — Z87891 Personal history of nicotine dependence: Secondary | ICD-10-CM | POA: Insufficient documentation

## 2023-09-12 DIAGNOSIS — D509 Iron deficiency anemia, unspecified: Secondary | ICD-10-CM | POA: Insufficient documentation

## 2023-09-12 DIAGNOSIS — G2581 Restless legs syndrome: Secondary | ICD-10-CM | POA: Insufficient documentation

## 2023-09-12 DIAGNOSIS — G4733 Obstructive sleep apnea (adult) (pediatric): Secondary | ICD-10-CM | POA: Insufficient documentation

## 2023-09-12 DIAGNOSIS — Z853 Personal history of malignant neoplasm of breast: Secondary | ICD-10-CM | POA: Insufficient documentation

## 2023-09-12 DIAGNOSIS — Z8673 Personal history of transient ischemic attack (TIA), and cerebral infarction without residual deficits: Secondary | ICD-10-CM | POA: Diagnosis not present

## 2023-09-12 DIAGNOSIS — Z5112 Encounter for antineoplastic immunotherapy: Secondary | ICD-10-CM | POA: Diagnosis not present

## 2023-09-12 DIAGNOSIS — G629 Polyneuropathy, unspecified: Secondary | ICD-10-CM | POA: Insufficient documentation

## 2023-09-12 DIAGNOSIS — Z7189 Other specified counseling: Secondary | ICD-10-CM

## 2023-09-12 DIAGNOSIS — E1122 Type 2 diabetes mellitus with diabetic chronic kidney disease: Secondary | ICD-10-CM | POA: Diagnosis not present

## 2023-09-12 DIAGNOSIS — Z90710 Acquired absence of both cervix and uterus: Secondary | ICD-10-CM | POA: Diagnosis not present

## 2023-09-12 DIAGNOSIS — Z95828 Presence of other vascular implants and grafts: Secondary | ICD-10-CM

## 2023-09-12 DIAGNOSIS — N189 Chronic kidney disease, unspecified: Secondary | ICD-10-CM | POA: Diagnosis not present

## 2023-09-12 LAB — CMP (CANCER CENTER ONLY)
ALT: 10 U/L (ref 0–44)
AST: 12 U/L — ABNORMAL LOW (ref 15–41)
Albumin: 4.1 g/dL (ref 3.5–5.0)
Alkaline Phosphatase: 64 U/L (ref 38–126)
Anion gap: 8 (ref 5–15)
BUN: 12 mg/dL (ref 8–23)
CO2: 26 mmol/L (ref 22–32)
Calcium: 9.2 mg/dL (ref 8.9–10.3)
Chloride: 109 mmol/L (ref 98–111)
Creatinine: 1.1 mg/dL — ABNORMAL HIGH (ref 0.44–1.00)
GFR, Estimated: 51 mL/min — ABNORMAL LOW (ref >60)
Glucose, Bld: 114 mg/dL — ABNORMAL HIGH (ref 70–99)
Potassium: 3.9 mmol/L (ref 3.5–5.1)
Sodium: 143 mmol/L (ref 135–145)
Total Bilirubin: 0.2 mg/dL (ref 0.0–1.2)
Total Protein: 6.3 g/dL — ABNORMAL LOW (ref 6.5–8.1)

## 2023-09-12 LAB — CBC WITH DIFFERENTIAL (CANCER CENTER ONLY)
Abs Immature Granulocytes: 0.01 10*3/uL (ref 0.00–0.07)
Basophils Absolute: 0 10*3/uL (ref 0.0–0.1)
Basophils Relative: 1 %
Eosinophils Absolute: 0.1 10*3/uL (ref 0.0–0.5)
Eosinophils Relative: 2 %
HCT: 31.2 % — ABNORMAL LOW (ref 36.0–46.0)
Hemoglobin: 10.4 g/dL — ABNORMAL LOW (ref 12.0–15.0)
Immature Granulocytes: 0 %
Lymphocytes Relative: 40 %
Lymphs Abs: 2.1 10*3/uL (ref 0.7–4.0)
MCH: 31.5 pg (ref 26.0–34.0)
MCHC: 33.3 g/dL (ref 30.0–36.0)
MCV: 94.5 fL (ref 80.0–100.0)
Monocytes Absolute: 0.3 10*3/uL (ref 0.1–1.0)
Monocytes Relative: 6 %
Neutro Abs: 2.7 10*3/uL (ref 1.7–7.7)
Neutrophils Relative %: 51 %
Platelet Count: 284 10*3/uL (ref 150–400)
RBC: 3.3 MIL/uL — ABNORMAL LOW (ref 3.87–5.11)
RDW: 12.5 % (ref 11.5–15.5)
WBC Count: 5.3 10*3/uL (ref 4.0–10.5)
nRBC: 0 % (ref 0.0–0.2)

## 2023-09-12 MED ORDER — SODIUM CHLORIDE 0.9% FLUSH
10.0000 mL | Freq: Once | INTRAVENOUS | Status: AC
  Administered 2023-09-12: 10 mL

## 2023-09-12 MED ORDER — DARATUMUMAB CHEMO INJECTION 400 MG/20ML
16.0000 mg/kg | Freq: Once | INTRAVENOUS | Status: AC
  Administered 2023-09-12: 1300 mg via INTRAVENOUS
  Filled 2023-09-12: qty 5

## 2023-09-12 MED ORDER — ACETAMINOPHEN 325 MG PO TABS
650.0000 mg | ORAL_TABLET | Freq: Once | ORAL | Status: AC
  Administered 2023-09-12: 650 mg via ORAL
  Filled 2023-09-12: qty 2

## 2023-09-12 MED ORDER — SODIUM CHLORIDE 0.9 % IV SOLN
12.0000 mg | Freq: Once | INTRAVENOUS | Status: AC
  Administered 2023-09-12: 12 mg via INTRAVENOUS
  Filled 2023-09-12: qty 1.2

## 2023-09-12 MED ORDER — SODIUM CHLORIDE 0.9 % IV SOLN: 16.0000 mg | Freq: Once | INTRAVENOUS | Status: DC

## 2023-09-12 MED ORDER — SODIUM CHLORIDE 0.9 % IV SOLN: Freq: Once | INTRAVENOUS | Status: AC

## 2023-09-12 MED ORDER — DIPHENHYDRAMINE HCL 25 MG PO CAPS
50.0000 mg | ORAL_CAPSULE | Freq: Once | ORAL | Status: AC
  Administered 2023-09-12: 50 mg via ORAL
  Filled 2023-09-12: qty 2

## 2023-09-12 MED ORDER — CYANOCOBALAMIN 1000 MCG/ML IJ SOLN
1000.0000 ug | Freq: Once | INTRAMUSCULAR | Status: AC
  Administered 2023-09-12: 1000 ug via INTRAMUSCULAR
  Filled 2023-09-12: qty 1

## 2023-09-12 MED ORDER — FAMOTIDINE IN NACL 20-0.9 MG/50ML-% IV SOLN
20.0000 mg | Freq: Once | INTRAVENOUS | Status: AC
Start: 2023-09-12 — End: 2023-09-12
  Administered 2023-09-12: 20 mg via INTRAVENOUS
  Filled 2023-09-12: qty 50

## 2023-09-12 NOTE — Progress Notes (Signed)
[facility] Follow up:   Date of Service: 09/12/23   Tracy Hatch, MD [address] [place] [address]  CC: Follow-up for continued evaluation and management of multiple myeloma  SUMMARY OF ONCOLOGIC HISTORY: Oncology History  History of right breast cancer  04/16/2000 Surgery   Left breast: Triple negative  invasive ductal cancer treated with lumpectomy, adjuvant chemotherapy, radiation , in [place], unknown stage   06/07/2015 Mammogram   Right breast mass 6x 6 x 5 mm, right axillary lymph node with slight cortex thickening measured 5 mm    06/13/2015 Initial Diagnosis   Right breast needle biopsy: Invasive ductal carcinoma, grade 1, right axillary lymph node biopsy negative , ER 95%, PR 5%, Ki-67 10%, HER-2 negative   06/13/2015 Clinical Stage   Stage IA: T1b N0   07/07/2015 Surgery   Right lumpectomy: Invasive ductal carcinoma grade 1, 1 cm span, with low-grade DCIS, DCIS focally 0.1 cm to inferior margin, 0/3 lymph nodes negative, ER 95%, PR 5%, HER-2 negative ratio 1.1, Ki-67 10%   07/07/2015 Pathologic Stage   Stage IA: T1c N0   07/13/2015 Procedure   Breast High/Moderate Risk Panel reveals no clinically significant variant at ATM, BRCA1, BRCA2, CDH1, CHEK2, PALB2, PTEN, and TP53.     08/23/2015 - 09/21/2015 Radiation Therapy   Adjuvant Radiation: Right breast/ 42.5Gy at 2.5 Gy per fraction x 17 fractions.   Right breast boost/ 7.5 Gy at 2.5 Gy per fraction x 3 fractions    Anti-estrogen oral therapy   Patient refused antiestrogen therapy   10/20/2015 Survivorship   Survivorship care plan completed and mailed to patient in lieu of in person visit at her request   Iron deficiency anemia  Multiple myeloma (HCC)  01/25/2020 Initial Diagnosis   Multiple myeloma (HCC)   02/11/2020 - 05/19/2020 Adjuvant Chemotherapy   Daratumumab Weekly    02/29/2020 - 03/13/2020 Radiation Therapy   The targets were treated to a total dose of 20 Gy in 10  fractions of 2 Gy each to the left hip and L5 using one plan.   06/02/2020 - 10/05/2020 Adjuvant Chemotherapy   Daratumumab and Carfilzomib   10/19/2020 -  Adjuvant Chemotherapy   Maintenance Daratumumab--initially every 2 weeks, then every 4 weeks.    05/24/2022 -  Chemotherapy   Patient is on Treatment Plan : MYELOMA Daratumumab IV q28d     Malignant neoplasm metastatic to bone (HCC)  02/08/2021 Initial Diagnosis   Bone metastases (HCC)   05/24/2022 -  Chemotherapy   Patient is on Treatment Plan : MYELOMA Daratumumab IV q28d       CURRENT THERAPY: Daratumumab/B12 injection   INTERVAL HISTORY:  Tracy [name] is a 78 y.o. female here for continued evaluation and management of multiple myeloma.   Patient was last seen by me on 07/18/2023 and reported mild chest ache causing back pain and mild acid reflux.  Today, she presents in a wheelchair. Patient reports that she has been doing well overall since her last clinical visit.   She reports neuropathy issues.   Patient reports that every time she receives Darzalex, she experiences sore throat that evening. Her sore throat symptom after infusions has been present for a while. Her throat symptoms begins with tingling the evening after treatment, and gradually becomes sore. Her throat soreness casues pain with eating and swallowing. Patient manages sore throat with Mucinex twice daily and Tylenol, and her sore throat resolves after 2 days.  Patient reports that she generally does not tolerate spicy foods due to causing throat soreness.   Patient reports that she takes prescription meds for allergies and generally never has issues with sore throat otherwise. She does not use benedryl at home.   Patient denies any black stools or blood in the stool. She denies any major medication changes recently. Patient has normal eating habits. She denies any other new concerns.  She reports that her gastroenterologist has retired. She reports that  she will see Dr. Burman Freestone on 09/22/2023 to have a gastric emptying study due to having new symptoms including upper stomach pain.   She is doing well otherwise, with no concern for infection issues.   Patient reports transportation issues.   She plans to travel to [place] in the Summertime.   Patient Active Problem List   Diagnosis Date Noted   Syncope 07/13/2023   Syncope and collapse 07/13/2023   RLS (restless legs syndrome) 06/12/2022   Malignant neoplasm metastatic to bone (HCC) 02/08/2021   Angiodysplasia of intestine 08/23/2020   Port-A-Cath in place 08/04/2020   Counseling regarding advance care planning and goals of care 02/07/2020   Multiple myeloma (HCC) 01/25/2020   Dizziness 10/12/2019   Post-nasal drainage 10/12/2019   Allergic rhinitis 08/19/2018   Type 2 diabetes mellitus with diabetic neuropathy, unspecified (HCC) 11/05/2017   Encounter for long-term use of muscle relaxants 09/24/2017   CAD in native artery 09/24/2017   OSA (obstructive sleep apnea) 09/24/2017   Snorings 09/24/2017   Asthenia 06/30/2017   Morbid obesity (HCC) 06/02/2017   Depression 06/02/2017   Iron deficiency anemia 09/23/2016   Anemia of chronic disease 09/28/2015   Genetic testing 08/21/2015   History of left breast cancer 07/13/2015   History of right breast cancer 06/20/2015   Hearing loss due to cerumen impaction 12/29/2014   Allergy to adhesive tape 05/19/2014   Hyperlipidemia 12/06/2013   GERD (gastroesophageal reflux disease) 12/06/2013   Cervical disc disease 11/11/2013   Osteopenia 05/25/2013   Allergic asthma 12/24/2012   Insomnia 04/02/2012   Physical exam, annual 04/02/2012   HTN (hypertension) 02/03/2012   Vertigo, benign positional 02/03/2012   Left groin pain 02/03/2012   Hip pain 10/10/2011   TIA (transient ischemic attack) 07/24/2011   Allergic reaction 07/05/2011   Osteoarthrosis involving lower leg 10/19/2010   Disorder of bone and cartilage 05/01/2010    Generalized anxiety disorder 05/01/2010   Vitamin D deficiency 02/20/2010   Unspecified chronic bronchitis (HCC) 08/24/2009   Other lymphedema 10/29/2007    is allergic to bacitracin-neomycin-polymyxin  [neomycin-bacitracin zn-polymyx], nsaids, tape, ambien [zolpidem tartrate], amoxicillin, clavulanic acid, contrast media [iodinated contrast media], latex, prednisone, and tessalon [benzonatate].  MEDICAL HISTORY: Past Medical History:  Diagnosis Date   Angiodysplasia of intestine 08/23/2020   Anxiety    Arthritis    Breast cancer (HCC) 06/13/15   Cancer (HCC) 2000   breast cancer   Chronic bronchitis (HCC)    Chronic bronchitis (HCC)    Hyperlipidemia    Hypertension    Myocardial infarction [facility]) 2001   Personal history of radiation therapy    Restless leg    Stroke (HCC) 2004   TIA, no deficits    SURGICAL HISTORY: Past Surgical History:  Procedure Laterality Date   ABDOMINAL HYSTERECTOMY  1985   BREAST LUMPECTOMY Left 2000   radiation and chemo   BREAST LUMPECTOMY Right 2016   radiation   BREAST SURGERY  2001   lt breast lumpectomy   COLONOSCOPY WITH  ESOPHAGOGASTRODUODENOSCOPY (EGD)  04/2020   ENTEROSCOPY N/A 03/15/2021   Procedure: ENTEROSCOPY;  Surgeon: Rachael Fee, MD;  Location: [facility];  Service: Endoscopy;  Laterality: N/A;   GIVENS CAPSULE STUDY  07/2020   HOT HEMOSTASIS N/A 03/15/2021   Procedure: HOT HEMOSTASIS (ARGON PLASMA COAGULATION/BICAP);  Surgeon: Rachael Fee, MD;  Location: [facility];  Service: Endoscopy;  Laterality: N/A;   IR IMAGING GUIDED PORT INSERTION  04/25/2020   RADIOACTIVE SEED GUIDED PARTIAL MASTECTOMY WITH AXILLARY SENTINEL LYMPH NODE BIOPSY Right 07/07/2015   Procedure: RIGHT RADIOACTIVE SEED GUIDED PARTIAL MASTECTOMY WITH AXILLARY SENTINEL LYMPH NODE BIOPSY;  Surgeon: Chevis Pretty III, MD;  Location: [facility];  Service: General;  Laterality: Right;   SMALL INTESTINE SURGERY     TUBAL LIGATION     UPPER  GASTROINTESTINAL ENDOSCOPY      SOCIAL HISTORY: Social History   Socioeconomic History   Marital status: Divorced    Spouse name: Not on file   Number of children: 7   Years of education: Not on file   Highest education level: Not on file  Occupational History   Occupation: retired  Tobacco Use   Smoking status: Former    Current packs/day: 0.00    Average packs/day: 1 pack/day for 20.0 years (20.0 ttl pk-yrs)    Types: Cigarettes    Start date: 07/30/1991    Quit date: 07/30/2011    Years since quitting: 12.1   Smokeless tobacco: Never   Tobacco comments:    Quit >4 years ago; 1 ppd for about 5/20 years (remaining was less)  Vaping Use   Vaping status: Former  Substance and Sexual Activity   Alcohol use: No    Alcohol/week: 0.0 standard drinks of alcohol   Drug use: No   Sexual activity: Not Currently  Other Topics Concern   Not on file  Social History Narrative   Lives alone.  Retired.  Education:  11th grade GED.  Children:  7 (one here).    Social Drivers of Health   Financial Resource Strain: High Risk (12/26/2022)   Overall Financial Resource Strain (CARDIA)    Difficulty of Paying Living Expenses: Hard  Food Insecurity: Food Insecurity Present (08/04/2023)   Hunger Vital Sign    Worried About Running Out of Food in the Last Year: Never true    Ran Out of Food in the Last Year: Sometimes true  Transportation Needs: No Transportation Needs (08/04/2023)   PRAPARE - Administrator, Civil Service (Medical): No    Lack of Transportation (Non-Medical): No  Recent Concern: Transportation Needs - Unmet Transportation Needs (07/16/2023)   PRAPARE - Administrator, Civil Service (Medical): Yes    Lack of Transportation (Non-Medical): No  Physical Activity: Inactive (12/26/2022)   Exercise Vital Sign    Days of Exercise per Week: 0 days    Minutes of Exercise per Session: 0 min  Stress: Stress Concern Present (12/26/2022)   [employer] of  Occupational Health - Occupational Stress Questionnaire    Feeling of Stress : To some extent  Social Connections: Moderately Isolated (12/26/2022)   Social Connection and Isolation Panel [NHANES]    Frequency of Communication with Friends and Family: More than three times a week    Frequency of Social Gatherings with Friends and Family: Once a week    Attends Religious Services: More than 4 times per year    Active Member of Clubs or Organizations: No    Attends  Club or Organization Meetings: Never    Marital Status: Divorced  Catering manager Violence: Not At Risk (08/04/2023)   Humiliation, Afraid, Rape, and Kick questionnaire    Fear of Current or Ex-Partner: No    Emotionally Abused: No    Physically Abused: No    Sexually Abused: No    FAMILY HISTORY: Family History  Problem Relation Age of Onset   Emphysema Mother 78       smoker   Diabetes Father    Lung cancer Sister        dx. <50; former smoker   Diabetes Brother    Diabetes Brother    Brain cancer Brother 33       unknown tumor type   Diabetes Paternal Aunt    Stroke Maternal Grandmother    Diabetes Paternal Grandmother    Cancer Daughter 45       neck cancer   Other Daughter        hysterectomy for unspecified reason   Colon cancer Daughter    Breast cancer Cousin    Cancer Cousin        unspecified type   Breast cancer Other        triple negative breast cancer in her 50s   Colon polyps Neg Hx    Esophageal cancer Neg Hx    Gallbladder disease Neg Hx    ROS  10 Point review of Systems was done is negative except as noted above.   PHYSICAL EXAMINATION  ECOG PERFORMANCE STATUS: 1 - Symptomatic but completely ambulatory .There were no vitals taken for this visit.   GENERAL:alert, in no acute distress and comfortable SKIN: no acute rashes, no significant lesions EYES: conjunctiva are pink and non-injected, sclera anicteric OROPHARYNX: MMM, no exudates, no oropharyngeal erythema or ulceration NECK:  supple, no JVD LYMPH:  no palpable lymphadenopathy in the cervical, axillary or inguinal regions LUNGS: clear to auscultation b/l with normal respiratory effort HEART: regular rate & rhythm ABDOMEN:  normoactive bowel sounds , non tender, not distended. Extremity: no pedal edema PSYCH: alert & oriented x 3 with fluent speech NEURO: no focal motor/sensory deficits    LABORATORY DATA: .    Latest Ref Rng & Units 08/15/2023    8:01 AM 07/18/2023   10:31 AM 07/13/2023    3:11 PM  CBC  WBC 4.0 - 10.5 K/uL 6.6  6.4  7.5   Hemoglobin 12.0 - 15.0 g/dL 16.1  9.6  9.9   Hematocrit 36.0 - 46.0 % 30.3  28.6  30.1   Platelets 150 - 400 K/uL 299  334  326    .    Latest Ref Rng & Units 08/15/2023    8:01 AM 07/18/2023   10:31 AM 07/13/2023    3:11 PM  CMP  Glucose 70 - 99 mg/dL 096  045  409   BUN 8 - 23 mg/dL 10  12  9    Creatinine 0.44 - 1.00 mg/dL 8.11  9.14  7.82   Sodium 135 - 145 mmol/L 144  142  137   Potassium 3.5 - 5.1 mmol/L 4.0  3.8  3.9   Chloride 98 - 111 mmol/L 111  107  104   CO2 22 - 32 mmol/L 24  26  22    Calcium 8.9 - 10.3 mg/dL 9.0  9.0  9.3   Total Protein 6.5 - 8.1 g/dL 6.5  6.1  6.2   Total Bilirubin 0.0 - 1.2 mg/dL 0.3  0.3  0.5  Alkaline Phos 38 - 126 U/L 70  71  70   AST 15 - 41 U/L 13  11  16    ALT 0 - 44 U/L 10  10  12      Mammogram 09/23/2022:   ASSESSMENT and THERAPY PLAN:   78 yo female here for follow-up of her multiple myeloma   1)  IgG Kappa Multiple myeloma with bone lesions, anemia, renal insuff. M spike @ 3.7g/dl on diagnosis. 1p deletion, polymorphic variant, 13q deletion Multiple myeloma panel from 06/28/2021 with no M spike.  IFE positive for IgG kappa monoclonal protein possibly from her daratumumab. Currently on monthly daratumumab 2) h/o DM2 3) Diabetic Neuropathy 4) CKD - likely from DM2, but could have an element of myeloma kidney. 5) h/o TIA and AMI 6) Iron deficiency 7) B12 deficiency   PLAN:  -Discussed lab results on  09/12/23 in detail with patient. CBC showed WBC of 5.3K, hemoglobin of 10.4, and platelets of 284K. -hgb stable -CMP pending at time of clinical visit -myeloma panel from 08/15/2023 showed M protein level of 0.2 g/dL. Her M protein has been stable between 0.1-0.2 g/dL over the last several months.  -Patient complains of tingling/sore throat the evening after treatment and expresses interest in extending the duration of her daratumumab infusion as she believes it will improve her sore throat -discussed that her sore throat could be due to pre medications, especially steroids. She was noted to have issues with Prednisone in the past.  -patient has not had any typical rxns with daratumumab that would indicate a need to adjust medication duration or dose adjustment.I discussed that I do not think that broadening the timing of her treatment would improve her sore throat.  -educated patient that infusion reactions occur immediately, which is not the case at this time -there is low likelihood of thrush, due to sore thrush resolving after 1-2 days -discussed potential proceeding options: Adjusting premedications including steroids. Discussed option to switch benedryl as it can cause dry throat, which she would not like to try Extending infusions if she would like Switching daratumumab to slow-release injection version -patient expresses hesitancy towards premedication adjustments -discussed goal to make treatment as tolerable as possible  -I discussed that there is a role for gradual dose reduction of steroids. Patient was previously on 40 MG, then 20 MG, and currently 16 MG.  -continue once a month dara -I initially recommended salt/baking soda mixtures with warm water to help sore throat. However, patient is weary of putting baking soda in her mouth and reports accidentally swallowing when gargling. Will therefore recommend gargling warm water only without salt/baking soda, 4x day.  -continue to follow  with gastroenterologist and PCP for continued management  Follow-up: ***  The total time spent in the appointment was *** minutes* .  All of the patient's questions were answered with apparent satisfaction. The patient knows to call the clinic with any problems, questions or concerns.   Wyvonnia Lora MD MS AAHIVMS [facility] Hematology/Oncology Physician [facility]  .*Total Encounter Time as defined by the Centers for Medicare and Medicaid Services includes, in addition to the face-to-face time of a patient visit (documented in the note above) non-face-to-face time: obtaining and reviewing outside history, ordering and reviewing medications, tests or procedures, care coordination (communications with other health care professionals or caregivers) and documentation in the medical record.    I,Mitra Faeizi,acting as a Neurosurgeon for Wyvonnia Lora, MD.,have documented all relevant documentation on the behalf of Kita Neace  Candise Che, MD,as directed by  Wyvonnia Lora, MD while in the presence of Wyvonnia Lora, MD.  ***

## 2023-09-12 NOTE — Progress Notes (Signed)
Patient seen by Dr. Addison Naegeli are within treatment parameters. Labs reviewed: and are not all within treatment parameters.    Dr Candise Che aware CR: 1.10,   Per physician team, patient is ready for treatment. Please note that modifications are being made to the treatment plan including    Dr Candise Che is decreasing Dexamethasone dose

## 2023-09-12 NOTE — Patient Instructions (Signed)
[facility] - A DEPT OF [facility]   Discharge Instructions: Thank you for choosing [facility] to provide your oncology and hematology care.   If you have a lab appointment with the Cancer Center, please go directly to the Cancer Center and check in at the registration area.   Wear comfortable clothing and clothing appropriate for easy access to any Portacath or PICC line.   We strive to give you quality time with your provider. You may need to reschedule your appointment if you arrive late (15 or more minutes).  Arriving late affects you and other patients whose appointments are after yours.  Also, if you miss three or more appointments without notifying the office, you may be dismissed from the clinic at the provider's discretion.      For prescription refill requests, have your pharmacy contact our office and allow 72 hours for refills to be completed.    Today you received the following chemotherapy and/or immunotherapy agents: Daratumumab (Darzalex)      To help prevent nausea and vomiting after your treatment, we encourage you to take your nausea medication as directed.  BELOW ARE SYMPTOMS THAT SHOULD BE REPORTED IMMEDIATELY: *FEVER GREATER THAN 100.4 F (38 C) OR HIGHER *CHILLS OR SWEATING *NAUSEA AND VOMITING THAT IS NOT CONTROLLED WITH YOUR NAUSEA MEDICATION *UNUSUAL SHORTNESS OF BREATH *UNUSUAL BRUISING OR BLEEDING *URINARY PROBLEMS (pain or burning when urinating, or frequent urination) *BOWEL PROBLEMS (unusual diarrhea, constipation, pain near the anus) TENDERNESS IN MOUTH AND THROAT WITH OR WITHOUT PRESENCE OF ULCERS (sore throat, sores in mouth, or a toothache) UNUSUAL RASH, SWELLING OR PAIN  UNUSUAL VAGINAL DISCHARGE OR ITCHING   Items with * indicate a potential emergency and should be followed up as soon as possible or go to the Emergency Department if any problems should occur.  Please show the CHEMOTHERAPY ALERT CARD or  IMMUNOTHERAPY ALERT CARD at check-in to the Emergency Department and triage nurse.  Should you have questions after your visit or need to cancel or reschedule your appointment, please contact [facility] - A DEPT OF [facility]  Dept: [phone]  and follow the prompts.  Office hours are 8:00 a.m. to 4:30 p.m. Monday - Friday. Please note that voicemails left after 4:00 p.m. may not be returned until the following business day.  We are closed weekends and major holidays. You have access to a nurse at all times for urgent questions. Please call the main number to the clinic Dept: [phone] and follow the prompts.   For any non-urgent questions, you may also contact your provider using MyChart. We now offer e-Visits for anyone 5 and older to request care online for non-urgent symptoms. For details visit [email or web address].   Also download the MyChart app! Go to the app store, search "MyChart", open the app, select [facility], and log in with your MyChart username and password.

## 2023-09-15 ENCOUNTER — Ambulatory Visit: Payer: Medicare Other

## 2023-09-15 ENCOUNTER — Inpatient Hospital Stay: Payer: Medicare Other

## 2023-09-15 VITALS — BP 142/66 | HR 80 | Temp 97.9°F | Resp 16

## 2023-09-15 DIAGNOSIS — E538 Deficiency of other specified B group vitamins: Secondary | ICD-10-CM | POA: Diagnosis not present

## 2023-09-15 DIAGNOSIS — D509 Iron deficiency anemia, unspecified: Secondary | ICD-10-CM | POA: Diagnosis not present

## 2023-09-15 DIAGNOSIS — N189 Chronic kidney disease, unspecified: Secondary | ICD-10-CM | POA: Diagnosis not present

## 2023-09-15 DIAGNOSIS — E1122 Type 2 diabetes mellitus with diabetic chronic kidney disease: Secondary | ICD-10-CM | POA: Diagnosis not present

## 2023-09-15 DIAGNOSIS — G629 Polyneuropathy, unspecified: Secondary | ICD-10-CM | POA: Diagnosis not present

## 2023-09-15 DIAGNOSIS — C9000 Multiple myeloma not having achieved remission: Secondary | ICD-10-CM

## 2023-09-15 DIAGNOSIS — Z853 Personal history of malignant neoplasm of breast: Secondary | ICD-10-CM | POA: Diagnosis not present

## 2023-09-15 DIAGNOSIS — Z8673 Personal history of transient ischemic attack (TIA), and cerebral infarction without residual deficits: Secondary | ICD-10-CM | POA: Diagnosis not present

## 2023-09-15 DIAGNOSIS — C7951 Secondary malignant neoplasm of bone: Secondary | ICD-10-CM

## 2023-09-15 DIAGNOSIS — Z801 Family history of malignant neoplasm of trachea, bronchus and lung: Secondary | ICD-10-CM | POA: Diagnosis not present

## 2023-09-15 DIAGNOSIS — Z87891 Personal history of nicotine dependence: Secondary | ICD-10-CM | POA: Diagnosis not present

## 2023-09-15 DIAGNOSIS — Z95828 Presence of other vascular implants and grafts: Secondary | ICD-10-CM

## 2023-09-15 DIAGNOSIS — Z803 Family history of malignant neoplasm of breast: Secondary | ICD-10-CM | POA: Diagnosis not present

## 2023-09-15 DIAGNOSIS — Z923 Personal history of irradiation: Secondary | ICD-10-CM | POA: Diagnosis not present

## 2023-09-15 DIAGNOSIS — G2581 Restless legs syndrome: Secondary | ICD-10-CM | POA: Diagnosis not present

## 2023-09-15 DIAGNOSIS — Z800 Family history of malignant neoplasm of digestive organs: Secondary | ICD-10-CM | POA: Diagnosis not present

## 2023-09-15 DIAGNOSIS — I2510 Atherosclerotic heart disease of native coronary artery without angina pectoris: Secondary | ICD-10-CM | POA: Diagnosis not present

## 2023-09-15 DIAGNOSIS — I252 Old myocardial infarction: Secondary | ICD-10-CM | POA: Diagnosis not present

## 2023-09-15 DIAGNOSIS — Z79899 Other long term (current) drug therapy: Secondary | ICD-10-CM | POA: Diagnosis not present

## 2023-09-15 DIAGNOSIS — Z5112 Encounter for antineoplastic immunotherapy: Secondary | ICD-10-CM | POA: Diagnosis not present

## 2023-09-15 DIAGNOSIS — I129 Hypertensive chronic kidney disease with stage 1 through stage 4 chronic kidney disease, or unspecified chronic kidney disease: Secondary | ICD-10-CM | POA: Diagnosis not present

## 2023-09-15 DIAGNOSIS — G4733 Obstructive sleep apnea (adult) (pediatric): Secondary | ICD-10-CM | POA: Diagnosis not present

## 2023-09-15 DIAGNOSIS — E1140 Type 2 diabetes mellitus with diabetic neuropathy, unspecified: Secondary | ICD-10-CM | POA: Diagnosis not present

## 2023-09-15 MED ORDER — SODIUM CHLORIDE 0.9% FLUSH
10.0000 mL | Freq: Once | INTRAVENOUS | Status: AC
  Administered 2023-09-15: 10 mL

## 2023-09-15 MED ORDER — SODIUM CHLORIDE 0.9 % IV SOLN
60.0000 mg | Freq: Once | INTRAVENOUS | Status: AC
  Administered 2023-09-15: 60 mg via INTRAVENOUS
  Filled 2023-09-15: qty 20
  Filled 2023-09-15: qty 10

## 2023-09-15 MED ORDER — HEPARIN SOD (PORK) LOCK FLUSH 100 UNIT/ML IV SOLN
500.0000 [IU] | Freq: Once | INTRAVENOUS | Status: AC
  Administered 2023-09-15: 500 [IU]

## 2023-09-15 NOTE — Patient Instructions (Signed)
 Pamidronate Injection What is this medication? PAMIDRONATE (pa mi DROE nate) treats high calcium levels in the blood caused by cancer. It may also be used with chemotherapy to treat weakened bones caused by cancer. It can also be used to treat Paget's disease of the bone. It works by slowing down the release of calcium from bones. This lowers calcium levels in your blood. It also makes your bones stronger and less likely to break (fracture). It belongs to a group of medications called bisphosphonates. This medicine may be used for other purposes; ask your health care provider or pharmacist if you have questions. COMMON BRAND NAME(S): Aredia What should I tell my care team before I take this medication? They need to know if you have any of these conditions: Bleeding disorder Cancer Dental disease Kidney disease Low levels of calcium or other minerals in the blood Low red blood cell counts Receiving steroids, such as dexamethasone or prednisone An unusual or allergic reaction to pamidronate, other medications, foods, dyes or preservatives Pregnant or trying to get pregnant Breast-feeding How should I use this medication? This medication is injected into a vein. It is given by your care team in a hospital or clinic setting. Talk to your care team about the use of this medication in children. Special care may be needed. Overdosage: If you think you have taken too much of this medicine contact a poison control center or emergency room at once. NOTE: This medicine is only for you. Do not share this medicine with others. What if I miss a dose? Keep appointments for follow-up doses. It is important not to miss your dose. Call your care team if you are unable to keep an appointment. What may interact with this medication? Certain antibiotics given by injection Medications for inflammation or pain, such as ibuprofen, naproxen Some diuretics, such as bumetanide, furosemide Cyclosporine Parathyroid  hormone Tacrolimus Teriparatide Thalidomide This list may not describe all possible interactions. Give your health care provider a list of all the medicines, herbs, non-prescription drugs, or dietary supplements you use. Also tell them if you smoke, drink alcohol, or use illegal drugs. Some items may interact with your medicine. What should I watch for while using this medication? Visit your care team for regular checks on your progress. It may be some time before you see the benefit from this medication. Some people who take this medication have severe bone, joint, or muscle pain. This medication may also increase your risk for jaw problems or a broken thigh bone. Tell your care team right away if you have severe pain in your jaw, bones, joints, or muscles. Tell your care team if you have any pain that does not go away or that gets worse. Tell your dentist and dental surgeon that you are taking this medication. You should not have major dental surgery while on this medication. See your dentist to have a dental exam and fix any dental problems before starting this medication. Take good care of your teeth while on this medication. Make sure you see your dentist for regular follow-up appointments. You should make sure you get enough calcium and vitamin D while you are taking this medication. Discuss the foods you eat and the vitamins you take with your care team. You may need bloodwork while you are taking this medication. Talk to your care team if you wish to become pregnant or think you might be pregnant. This medication can cause serious birth defects. What side effects may I notice from receiving  this medication? Side effects that you should report to your care team as soon as possible: Allergic reactions--skin rash, itching, hives, swelling of the face, lips, tongue, or throat Kidney injury--decrease in the amount of urine, swelling of the ankles, hands, or feet Low calcium level--muscle pain or  cramps, confusion, tingling, or numbness in the hands or feet Osteonecrosis of the jaw--pain, swelling, or redness in the mouth, numbness of the jaw, poor healing after dental work, unusual discharge from the mouth, visible bones in the mouth Severe bone, joint, or muscle pain Side effects that usually do not require medical attention (report to your care team if they continue or are bothersome): Constipation Fatigue Fever Loss of appetite Nausea Pain, redness, or irritation at injection site Stomach pain This list may not describe all possible side effects. Call your doctor for medical advice about side effects. You may report side effects to FDA at 1-800-FDA-1088. Where should I keep my medication? This medication is given in a hospital or clinic. It will not be stored at home. NOTE: This sheet is a summary. It may not cover all possible information. If you have questions about this medicine, talk to your doctor, pharmacist, or health care provider.  2024 Elsevier/Gold Standard (2021-09-03 00:00:00)

## 2023-09-16 LAB — MULTIPLE MYELOMA PANEL, SERUM
Albumin SerPl Elph-Mcnc: 3.5 g/dL (ref 2.9–4.4)
Albumin/Glob SerPl: 1.4 (ref 0.7–1.7)
Alpha 1: 0.2 g/dL (ref 0.0–0.4)
Alpha2 Glob SerPl Elph-Mcnc: 1.1 g/dL — ABNORMAL HIGH (ref 0.4–1.0)
B-Globulin SerPl Elph-Mcnc: 0.9 g/dL (ref 0.7–1.3)
Gamma Glob SerPl Elph-Mcnc: 0.4 g/dL (ref 0.4–1.8)
Globulin, Total: 2.6 g/dL (ref 2.2–3.9)
IgA: 62 mg/dL — ABNORMAL LOW (ref 64–422)
IgG (Immunoglobin G), Serum: 405 mg/dL — ABNORMAL LOW (ref 586–1602)
IgM (Immunoglobulin M), Srm: 34 mg/dL (ref 26–217)
M Protein SerPl Elph-Mcnc: 0.1 g/dL — ABNORMAL HIGH
Total Protein ELP: 6.1 g/dL (ref 6.0–8.5)

## 2023-09-18 ENCOUNTER — Ambulatory Visit: Payer: Self-pay

## 2023-09-18 ENCOUNTER — Encounter: Payer: Self-pay | Admitting: Hematology

## 2023-09-18 NOTE — Patient Instructions (Signed)
Visit Information  Thank you for taking time to visit with me today. Please don't hesitate to contact me if I can be of assistance to you.   Following are the goals we discussed today:  Patient will await a response from [employer] on approval/denial.   Our next appointment is by telephone on 09/26/23 at 1pm  Please call the care guide team at [phone] if you need to cancel or reschedule your appointment.   If you are experiencing a Mental Health or Behavioral Health Crisis or need someone to talk to, please call 911  Patient verbalizes understanding of instructions and care plan provided today and agrees to view in MyChart. Active MyChart status and patient understanding of how to access instructions and care plan via MyChart confirmed with patient.     Telephone follow up appointment with care management team member scheduled for:09/26/23 at 1pm   Lysle Morales, BSW [place]  [facility], [facility] Social Worker Direct Dial: [phone]  Fax: [phone] Website: [email or web address]

## 2023-09-18 NOTE — Patient Outreach (Signed)
Care Coordination   Follow Up Visit Note   09/18/2023 Name: Tracy [name] MRN: [id number] DOB: [birth year]  Tracy [name] is a 78 y.o. year old female who sees [name], Helane Rima, MD for primary care. I spoke with  Tracy [name] by phone today.  What matters to the patients health and wellness today?  Patient wants additional transportation options.    Goals Addressed             This Visit's Progress    Care Coordination Activities       Interventions Today    Flowsheet Row Most Recent Value  Chronic Disease   Chronic disease during today's visit Diabetes, Hypertension (HTN), Other  [Cancer]  General Interventions   General Interventions Discussed/Reviewed General Interventions Discussed, General Interventions Reviewed  [Pt has not received an update on the SCAT application. Pt is on the waiting list for Meals on Wheels.]              SDOH assessments and interventions completed:  No     Care Coordination Interventions:  Yes, provided   Follow up plan: Follow up call scheduled for 09/26/23 at 1pm.    Encounter Outcome:  Patient Visit Completed

## 2023-09-22 ENCOUNTER — Encounter (HOSPITAL_COMMUNITY)
Admission: RE | Admit: 2023-09-22 | Discharge: 2023-09-22 | Disposition: A | Discharge status: Home / Self Care | Payer: Medicare Other | Source: Ambulatory Visit | Attending: Gastroenterology | Admitting: Gastroenterology

## 2023-09-22 DIAGNOSIS — G8929 Other chronic pain: Secondary | ICD-10-CM | POA: Insufficient documentation

## 2023-09-22 DIAGNOSIS — R1013 Epigastric pain: Secondary | ICD-10-CM | POA: Diagnosis not present

## 2023-09-22 MED ORDER — TECHNETIUM TC 99M SULFUR COLLOID
2.2000 | Freq: Once | INTRAVENOUS | Status: AC
  Administered 2023-09-22: 2.2 via ORAL

## 2023-09-23 ENCOUNTER — Other Ambulatory Visit: Payer: Self-pay

## 2023-09-25 ENCOUNTER — Telehealth: Payer: Self-pay | Admitting: Hematology

## 2023-09-25 ENCOUNTER — Other Ambulatory Visit: Payer: Self-pay | Admitting: Family Medicine

## 2023-09-25 DIAGNOSIS — Z1231 Encounter for screening mammogram for malignant neoplasm of breast: Secondary | ICD-10-CM

## 2023-09-25 NOTE — Telephone Encounter (Signed)
 Left patient a vm regarding upcoming appointment changes

## 2023-09-26 ENCOUNTER — Ambulatory Visit: Payer: Self-pay

## 2023-09-26 NOTE — Patient Outreach (Signed)
 Care Coordination   09/26/2023 Name: Tracy [name] MRN: [id number] DOB: [birth year]   Care Coordination Outreach Attempts:  An unsuccessful outreach was attempted for an appointment today.  Follow Up Plan:  Additional outreach attempts will be made to offer the patient complex care management information and services.   Encounter Outcome:  No Answer   Care Coordination Interventions:  No, not indicated    Lysle Morales, BSW [place]  [facility], [facility] Social Worker Direct Dial: [phone]  Fax: [phone] Website: [email or web address]

## 2023-09-29 ENCOUNTER — Encounter: Payer: Self-pay | Admitting: Gastroenterology

## 2023-09-29 NOTE — Progress Notes (Signed)
 Tracy [name],  Your gastric emptying study was normal.  You do not have impaired gastric emptying.  Will await the results of the stool studies when you are able to submit them.    Bonita Quin,  Please relay the above message to Tracy [name].

## 2023-10-01 ENCOUNTER — Other Ambulatory Visit

## 2023-10-01 ENCOUNTER — Other Ambulatory Visit: Payer: Self-pay

## 2023-10-01 ENCOUNTER — Ambulatory Visit
Admission: RE | Admit: 2023-10-01 | Discharge: 2023-10-01 | Disposition: A | Discharge status: Home / Self Care | Payer: Medicare Other | Source: Ambulatory Visit | Attending: Family Medicine | Admitting: Family Medicine

## 2023-10-01 DIAGNOSIS — Z1231 Encounter for screening mammogram for malignant neoplasm of breast: Secondary | ICD-10-CM | POA: Diagnosis not present

## 2023-10-01 DIAGNOSIS — G8929 Other chronic pain: Secondary | ICD-10-CM

## 2023-10-01 DIAGNOSIS — R1013 Epigastric pain: Secondary | ICD-10-CM | POA: Diagnosis not present

## 2023-10-03 ENCOUNTER — Encounter: Payer: Self-pay | Admitting: Gastroenterology

## 2023-10-03 ENCOUNTER — Telehealth: Payer: Self-pay | Admitting: Gastroenterology

## 2023-10-03 ENCOUNTER — Telehealth: Payer: Self-pay | Admitting: Family Medicine

## 2023-10-03 ENCOUNTER — Other Ambulatory Visit: Payer: Self-pay | Admitting: Family Medicine

## 2023-10-03 MED ORDER — HYDROCODONE-ACETAMINOPHEN 5-325 MG PO TABS: 1.0000 | ORAL_TABLET | Freq: Four times a day (QID) | ORAL | 0 refills | Status: DC | PRN

## 2023-10-03 MED ORDER — DICYCLOMINE HCL 10 MG PO CAPS: 10.0000 mg | ORAL_CAPSULE | Freq: Four times a day (QID) | ORAL | 1 refills | Status: AC

## 2023-10-03 NOTE — Telephone Encounter (Signed)
 Requested Prescriptions   Pending Prescriptions Disp Refills   HYDROcodone-acetaminophen (NORCO/VICODIN) 5-325 MG tablet 20 tablet 0    Sig: Take 1 tablet by mouth every 6 (six) hours as needed for moderate pain (pain score 4-6).     Date of patient request: 10/03/2023 Last office visit: 07/22/2023 Upcoming visit: Visit date not found Date of last refill: 07/22/2023 Last refill amount: 20 tabs 0 refills      Patient does not have a follow up, most recent appointment notes states to come back as needed. Would you like her to come in for an appointment?

## 2023-10-03 NOTE — Telephone Encounter (Signed)
 The pt complains of abd pain around the "bra line" this is the same pain she has had in the past. She was last seen by Dr Tomasa Rand on 07/31/23. She has tried the IBgard that did not help at all. She has stool test from 3/5 one has resulted and waiting on the panc elastase.  She would like to know what else she can do for the discomfort that persist.

## 2023-10-03 NOTE — Telephone Encounter (Unsigned)
 Copied from CRM [id number]. Topic: Clinical - Medication Refill >> Oct 03, 2023  2:55 PM Almira Coaster wrote: Most Recent Primary Care Visit:  Provider: [name]  Department: [facility]  Visit Type: HOSPITAL FU  Date: 07/22/2023  Medication: HYDROcodone-acetaminophen (NORCO/VICODIN) 5-325 MG tablet  Has the patient contacted their pharmacy? No (Agent: If no, request that the patient contact the pharmacy for the refill. If patient does not wish to contact the pharmacy document the reason why and proceed with request.) (Agent: If yes, when and what did the pharmacy advise?)  Is this the correct pharmacy for this prescription? Yes If no, delete pharmacy and type the correct one.  This is the patient's preferred pharmacy:   [id number] [place], [place] - [address] AT [facility] OF [facility] [phone] [address] [place] [address]     Has the prescription been filled recently? No  Is the patient out of the medication? Yes  Has the patient been seen for an appointment in the last year OR does the patient have an upcoming appointment? Yes  Can we respond through MyChart? Yes  Agent: Please be advised that Rx refills may take up to 3 business days. We ask that you follow-up with your pharmacy.

## 2023-10-03 NOTE — Telephone Encounter (Signed)
 Medication was already signed and sent by Beverely Low MD. Prescription was confirmed by pharmacy per chart review

## 2023-10-03 NOTE — Addendum Note (Signed)
 Addended by: Cordella Register on: 10/03/2023 03:52 PM   Modules accepted: Orders

## 2023-10-03 NOTE — Progress Notes (Signed)
 Ms. Novitski,  Your fecal lactoferrin was positive.  This can indicate inflammation in the GI tract.  I think we should first evaluate for infectious causes of inflammation. This is best done through another stool test.  If this is negative, then we may need to proceed with another colonoscopy to rule out inflammatory bowel disease.  Patty, Can you please relay the above message to Ms. Gibler order a GI PCR pathogen panel and C diff PCR test?

## 2023-10-03 NOTE — Telephone Encounter (Signed)
 The pt has been advised and prescription sent as ordered

## 2023-10-03 NOTE — Telephone Encounter (Signed)
 Encourage patient to contact the pharmacy for refills or they can request refills through [facility]  (Please schedule appointment if patient has not been seen in over a year)    WHAT PHARMACY WOULD THEY LIKE THIS SENT TO: [id number] - [place], [place] - [address] AT [facility] OF [address]   MEDICATION NAME & DOSE: HYDROcodone-acetaminophen (NORCO/VICODIN) 5-325 MG tablet   NOTES/COMMENTS FROM PATIENT:       Front office please notify patient: It takes 48-72 hours to process rx refill requests Ask patient to call pharmacy to ensure rx is ready before heading there.

## 2023-10-03 NOTE — Telephone Encounter (Signed)
 Patient called and stated that she is having a lot of Epigastric pain and was wondering if Dr. Tomasa Rand would prescribed her a medication that will help her with her pain. Patient is requesting a call back. Please advise.

## 2023-10-03 NOTE — Telephone Encounter (Signed)
 Prescription sent to pharmacy.

## 2023-10-03 NOTE — Addendum Note (Signed)
 Addended by: [name] on: 10/03/2023 03:51 PM   Modules accepted: Orders

## 2023-10-06 ENCOUNTER — Other Ambulatory Visit: Payer: Self-pay

## 2023-10-06 ENCOUNTER — Telehealth: Payer: Self-pay | Admitting: Hematology

## 2023-10-06 DIAGNOSIS — R195 Other fecal abnormalities: Secondary | ICD-10-CM

## 2023-10-06 DIAGNOSIS — G8929 Other chronic pain: Secondary | ICD-10-CM

## 2023-10-07 ENCOUNTER — Other Ambulatory Visit

## 2023-10-07 LAB — FECAL LACTOFERRIN, QUANT
Fecal Lactoferrin: POSITIVE — AB
MICRO NUMBER:: 16162600
SPECIMEN QUALITY:: ADEQUATE

## 2023-10-07 LAB — PANCREATIC ELASTASE, FECAL: Pancreatic Elastase-1, Stool: >800 ug/g (ref >200)

## 2023-10-09 ENCOUNTER — Other Ambulatory Visit: Payer: Self-pay | Admitting: Family Medicine

## 2023-10-09 DIAGNOSIS — E785 Hyperlipidemia, unspecified: Secondary | ICD-10-CM

## 2023-10-10 ENCOUNTER — Inpatient Hospital Stay: Payer: Medicare Other | Attending: Hematology

## 2023-10-10 ENCOUNTER — Inpatient Hospital Stay: Payer: Medicare Other

## 2023-10-10 ENCOUNTER — Ambulatory Visit: Payer: Medicare Other

## 2023-10-10 ENCOUNTER — Other Ambulatory Visit

## 2023-10-10 ENCOUNTER — Other Ambulatory Visit: Payer: Medicare Other

## 2023-10-10 VITALS — BP 140/61 | HR 77 | Temp 98.4°F | Resp 22 | Wt 186.8 lb

## 2023-10-10 DIAGNOSIS — G8929 Other chronic pain: Secondary | ICD-10-CM | POA: Diagnosis not present

## 2023-10-10 DIAGNOSIS — C9000 Multiple myeloma not having achieved remission: Secondary | ICD-10-CM

## 2023-10-10 DIAGNOSIS — Z801 Family history of malignant neoplasm of trachea, bronchus and lung: Secondary | ICD-10-CM | POA: Diagnosis not present

## 2023-10-10 DIAGNOSIS — I129 Hypertensive chronic kidney disease with stage 1 through stage 4 chronic kidney disease, or unspecified chronic kidney disease: Secondary | ICD-10-CM | POA: Insufficient documentation

## 2023-10-10 DIAGNOSIS — E1140 Type 2 diabetes mellitus with diabetic neuropathy, unspecified: Secondary | ICD-10-CM | POA: Insufficient documentation

## 2023-10-10 DIAGNOSIS — Z923 Personal history of irradiation: Secondary | ICD-10-CM | POA: Diagnosis not present

## 2023-10-10 DIAGNOSIS — N189 Chronic kidney disease, unspecified: Secondary | ICD-10-CM | POA: Insufficient documentation

## 2023-10-10 DIAGNOSIS — Z79899 Other long term (current) drug therapy: Secondary | ICD-10-CM | POA: Insufficient documentation

## 2023-10-10 DIAGNOSIS — R1013 Epigastric pain: Secondary | ICD-10-CM | POA: Diagnosis not present

## 2023-10-10 DIAGNOSIS — Z7189 Other specified counseling: Secondary | ICD-10-CM

## 2023-10-10 DIAGNOSIS — Z90710 Acquired absence of both cervix and uterus: Secondary | ICD-10-CM | POA: Insufficient documentation

## 2023-10-10 DIAGNOSIS — Z8673 Personal history of transient ischemic attack (TIA), and cerebral infarction without residual deficits: Secondary | ICD-10-CM | POA: Insufficient documentation

## 2023-10-10 DIAGNOSIS — Z800 Family history of malignant neoplasm of digestive organs: Secondary | ICD-10-CM | POA: Diagnosis not present

## 2023-10-10 DIAGNOSIS — R195 Other fecal abnormalities: Secondary | ICD-10-CM | POA: Diagnosis not present

## 2023-10-10 DIAGNOSIS — Z5112 Encounter for antineoplastic immunotherapy: Secondary | ICD-10-CM | POA: Insufficient documentation

## 2023-10-10 DIAGNOSIS — G629 Polyneuropathy, unspecified: Secondary | ICD-10-CM | POA: Diagnosis not present

## 2023-10-10 DIAGNOSIS — Z853 Personal history of malignant neoplasm of breast: Secondary | ICD-10-CM | POA: Diagnosis not present

## 2023-10-10 DIAGNOSIS — C7951 Secondary malignant neoplasm of bone: Secondary | ICD-10-CM

## 2023-10-10 DIAGNOSIS — D509 Iron deficiency anemia, unspecified: Secondary | ICD-10-CM | POA: Diagnosis not present

## 2023-10-10 DIAGNOSIS — I252 Old myocardial infarction: Secondary | ICD-10-CM | POA: Diagnosis not present

## 2023-10-10 DIAGNOSIS — G2581 Restless legs syndrome: Secondary | ICD-10-CM | POA: Insufficient documentation

## 2023-10-10 DIAGNOSIS — Z803 Family history of malignant neoplasm of breast: Secondary | ICD-10-CM | POA: Insufficient documentation

## 2023-10-10 DIAGNOSIS — G4733 Obstructive sleep apnea (adult) (pediatric): Secondary | ICD-10-CM | POA: Insufficient documentation

## 2023-10-10 DIAGNOSIS — I2510 Atherosclerotic heart disease of native coronary artery without angina pectoris: Secondary | ICD-10-CM | POA: Diagnosis not present

## 2023-10-10 DIAGNOSIS — E1122 Type 2 diabetes mellitus with diabetic chronic kidney disease: Secondary | ICD-10-CM | POA: Diagnosis not present

## 2023-10-10 DIAGNOSIS — Z87891 Personal history of nicotine dependence: Secondary | ICD-10-CM | POA: Diagnosis not present

## 2023-10-10 DIAGNOSIS — Z95828 Presence of other vascular implants and grafts: Secondary | ICD-10-CM

## 2023-10-10 DIAGNOSIS — E538 Deficiency of other specified B group vitamins: Secondary | ICD-10-CM | POA: Insufficient documentation

## 2023-10-10 LAB — CBC WITH DIFFERENTIAL (CANCER CENTER ONLY)
Abs Immature Granulocytes: 0.01 10*3/uL (ref 0.00–0.07)
Basophils Absolute: 0 10*3/uL (ref 0.0–0.1)
Basophils Relative: 1 %
Eosinophils Absolute: 0.1 10*3/uL (ref 0.0–0.5)
Eosinophils Relative: 1 %
HCT: 30.2 % — ABNORMAL LOW (ref 36.0–46.0)
Hemoglobin: 10.2 g/dL — ABNORMAL LOW (ref 12.0–15.0)
Immature Granulocytes: 0 %
Lymphocytes Relative: 39 %
Lymphs Abs: 1.9 10*3/uL (ref 0.7–4.0)
MCH: 30.7 pg (ref 26.0–34.0)
MCHC: 33.8 g/dL (ref 30.0–36.0)
MCV: 91 fL (ref 80.0–100.0)
Monocytes Absolute: 0.3 10*3/uL (ref 0.1–1.0)
Monocytes Relative: 6 %
Neutro Abs: 2.6 10*3/uL (ref 1.7–7.7)
Neutrophils Relative %: 53 %
Platelet Count: 267 10*3/uL (ref 150–400)
RBC: 3.32 MIL/uL — ABNORMAL LOW (ref 3.87–5.11)
RDW: 12.8 % (ref 11.5–15.5)
WBC Count: 4.9 10*3/uL (ref 4.0–10.5)
nRBC: 0 % (ref 0.0–0.2)

## 2023-10-10 LAB — CMP (CANCER CENTER ONLY)
ALT: 9 U/L (ref 0–44)
AST: 12 U/L — ABNORMAL LOW (ref 15–41)
Albumin: 4.2 g/dL (ref 3.5–5.0)
Alkaline Phosphatase: 70 U/L (ref 38–126)
Anion gap: 11 (ref 5–15)
BUN: 11 mg/dL (ref 8–23)
CO2: 25 mmol/L (ref 22–32)
Calcium: 9 mg/dL (ref 8.9–10.3)
Chloride: 108 mmol/L (ref 98–111)
Creatinine: 1.22 mg/dL — ABNORMAL HIGH (ref 0.44–1.00)
GFR, Estimated: 45 mL/min — ABNORMAL LOW (ref >60)
Glucose, Bld: 109 mg/dL — ABNORMAL HIGH (ref 70–99)
Potassium: 3.9 mmol/L (ref 3.5–5.1)
Sodium: 144 mmol/L (ref 135–145)
Total Bilirubin: 0.3 mg/dL (ref 0.0–1.2)
Total Protein: 6.4 g/dL — ABNORMAL LOW (ref 6.5–8.1)

## 2023-10-10 MED ORDER — DARATUMUMAB CHEMO INJECTION 400 MG/20ML
16.0000 mg/kg | Freq: Once | INTRAVENOUS | Status: AC
  Administered 2023-10-10: 1300 mg via INTRAVENOUS
  Filled 2023-10-10: qty 5

## 2023-10-10 MED ORDER — FAMOTIDINE IN NACL 20-0.9 MG/50ML-% IV SOLN
20.0000 mg | Freq: Once | INTRAVENOUS | Status: AC
Start: 2023-10-10 — End: 2023-10-10
  Administered 2023-10-10: 20 mg via INTRAVENOUS
  Filled 2023-10-10: qty 50

## 2023-10-10 MED ORDER — DEXAMETHASONE SODIUM PHOSPHATE 100 MG/10ML IJ SOLN
12.0000 mg | Freq: Once | INTRAMUSCULAR | Status: AC
  Administered 2023-10-10: 12 mg via INTRAVENOUS
  Filled 2023-10-10: qty 1.2

## 2023-10-10 MED ORDER — CYANOCOBALAMIN 1000 MCG/ML IJ SOLN
1000.0000 ug | Freq: Once | INTRAMUSCULAR | Status: AC
  Administered 2023-10-10: 1000 ug via INTRAMUSCULAR
  Filled 2023-10-10: qty 1

## 2023-10-10 MED ORDER — HEPARIN SOD (PORK) LOCK FLUSH 100 UNIT/ML IV SOLN
500.0000 [IU] | Freq: Once | INTRAVENOUS | Status: AC | PRN
  Administered 2023-10-10: 500 [IU]

## 2023-10-10 MED ORDER — SODIUM CHLORIDE 0.9% FLUSH
10.0000 mL | Freq: Once | INTRAVENOUS | Status: AC
  Administered 2023-10-10: 10 mL

## 2023-10-10 MED ORDER — DIPHENHYDRAMINE HCL 25 MG PO CAPS
50.0000 mg | ORAL_CAPSULE | Freq: Once | ORAL | Status: AC
Start: 2023-10-10 — End: 2023-10-10
  Administered 2023-10-10: 50 mg via ORAL
  Filled 2023-10-10: qty 2

## 2023-10-10 MED ORDER — SODIUM CHLORIDE 0.9% FLUSH
10.0000 mL | INTRAVENOUS | Status: DC | PRN
Start: 2023-10-10 — End: 2023-10-10
  Administered 2023-10-10: 10 mL

## 2023-10-10 MED ORDER — ACETAMINOPHEN 500 MG PO TABS
1000.0000 mg | ORAL_TABLET | Freq: Once | ORAL | Status: AC
  Administered 2023-10-10: 1000 mg via ORAL
  Filled 2023-10-10: qty 2

## 2023-10-10 MED ORDER — SODIUM CHLORIDE 0.9 % IV SOLN: Freq: Once | INTRAVENOUS | Status: AC

## 2023-10-10 NOTE — Patient Instructions (Signed)
 [facility] - A DEPT OF [facility]  Discharge Instructions: Thank you for choosing [facility] to provide your oncology and hematology care.   If you have a lab appointment with the Cancer Center, please go directly to the Cancer Center and check in at the registration area.   Wear comfortable clothing and clothing appropriate for easy access to any Portacath or PICC line.   We strive to give you quality time with your provider. You may need to reschedule your appointment if you arrive late (15 or more minutes).  Arriving late affects you and other patients whose appointments are after yours.  Also, if you miss three or more appointments without notifying the office, you may be dismissed from the clinic at the provider's discretion.      For prescription refill requests, have your pharmacy contact our office and allow 72 hours for refills to be completed.    Today you received the following chemotherapy and/or immunotherapy agents: Daratumumab.       To help prevent nausea and vomiting after your treatment, we encourage you to take your nausea medication as directed.  BELOW ARE SYMPTOMS THAT SHOULD BE REPORTED IMMEDIATELY: *FEVER GREATER THAN 100.4 F (38 C) OR HIGHER *CHILLS OR SWEATING *NAUSEA AND VOMITING THAT IS NOT CONTROLLED WITH YOUR NAUSEA MEDICATION *UNUSUAL SHORTNESS OF BREATH *UNUSUAL BRUISING OR BLEEDING *URINARY PROBLEMS (pain or burning when urinating, or frequent urination) *BOWEL PROBLEMS (unusual diarrhea, constipation, pain near the anus) TENDERNESS IN MOUTH AND THROAT WITH OR WITHOUT PRESENCE OF ULCERS (sore throat, sores in mouth, or a toothache) UNUSUAL RASH, SWELLING OR PAIN  UNUSUAL VAGINAL DISCHARGE OR ITCHING   Items with * indicate a potential emergency and should be followed up as soon as possible or go to the Emergency Department if any problems should occur.  Please show the CHEMOTHERAPY ALERT CARD or  IMMUNOTHERAPY ALERT CARD at check-in to the Emergency Department and triage nurse.  Should you have questions after your visit or need to cancel or reschedule your appointment, please contact [facility] - A DEPT OF [facility]  Dept: [phone]  and follow the prompts.  Office hours are 8:00 a.m. to 4:30 p.m. Monday - Friday. Please note that voicemails left after 4:00 p.m. may not be returned until the following business day.  We are closed weekends and major holidays. You have access to a nurse at all times for urgent questions. Please call the main number to the clinic Dept: [phone] and follow the prompts.   For any non-urgent questions, you may also contact your provider using MyChart. We now offer e-Visits for anyone 68 and older to request care online for non-urgent symptoms. For details visit [email or web address].   Also download the MyChart app! Go to the app store, search "MyChart", open the app, select [place], and log in with your MyChart username and password.

## 2023-10-11 LAB — CLOSTRIDIUM DIFFICILE BY PCR: Toxigenic C. Difficile by PCR: NEGATIVE

## 2023-10-13 LAB — GI PROFILE, STOOL, PCR

## 2023-10-13 LAB — MULTIPLE MYELOMA PANEL, SERUM
Albumin SerPl Elph-Mcnc: 3.4 g/dL (ref 2.9–4.4)
Albumin/Glob SerPl: 1.4 (ref 0.7–1.7)
Alpha 1: 0.2 g/dL (ref 0.0–0.4)
Alpha2 Glob SerPl Elph-Mcnc: 1.2 g/dL — ABNORMAL HIGH (ref 0.4–1.0)
B-Globulin SerPl Elph-Mcnc: 0.9 g/dL (ref 0.7–1.3)
Gamma Glob SerPl Elph-Mcnc: 0.4 g/dL (ref 0.4–1.8)
Globulin, Total: 2.6 g/dL (ref 2.2–3.9)
IgA: 58 mg/dL — ABNORMAL LOW (ref 64–422)
IgG (Immunoglobin G), Serum: 423 mg/dL — ABNORMAL LOW (ref 586–1602)
IgM (Immunoglobulin M), Srm: 40 mg/dL (ref 26–217)
M Protein SerPl Elph-Mcnc: 0.1 g/dL — ABNORMAL HIGH
Total Protein ELP: 6 g/dL (ref 6.0–8.5)

## 2023-10-14 ENCOUNTER — Ambulatory Visit: Payer: Self-pay | Admitting: Licensed Clinical Social Worker

## 2023-10-15 ENCOUNTER — Encounter: Payer: Self-pay | Admitting: Gastroenterology

## 2023-10-15 NOTE — Patient Outreach (Signed)
 Care Coordination   Follow Up Visit Note   10/14/2023 Name: EMBREE BRAWLEY MRN: [id number] DOB: 1945/09/22  Real Cons Brechtel is a 78 y.o. year old female who sees [name], Helane Rima, MD for primary care. I spoke with  Real Cons Gallogly by phone today.  What matters to the patients health and wellness today?  Transportation and Symptom Management    Goals Addressed             This Visit's Progress    LCSW-Plan of Care   On track    Activities and task to complete in order to accomplish goals.   Keep all upcoming appointments discussed today Continue with compliance of taking medication prescribed by Doctor Implement healthy coping skills discussed to assist with management of symptoms Call [employer] [place] to add payment method for transportation service           SDOH assessments and interventions completed:  No     Care Coordination Interventions:  Yes, provided  Interventions Today    Flowsheet Row Most Recent Value  Chronic Disease   Chronic disease during today's visit Other  [GAD, MDD]  General Interventions   General Interventions Discussed/Reviewed General Interventions Reviewed, [facility], Doctor Visits  [Patient was approved for [employer] [place] to assist pt with transportation in [employer]. Pt agreed to f/up with [employer] [place] to update payment method.]  Doctor Visits Discussed/Reviewed Doctor Visits Reviewed  Mental Health Interventions   Mental Health Discussed/Reviewed Mental Health Reviewed, Coping Strategies, Anxiety, Depression  Nutrition Interventions   Nutrition Discussed/Reviewed Nutrition Reviewed  Pharmacy Interventions   Pharmacy Dicussed/Reviewed Pharmacy Topics Reviewed, Medication Adherence  Safety Interventions   Safety Discussed/Reviewed Safety Reviewed       Follow up plan: Follow up call scheduled for 4-6 weeks    Encounter Outcome:  Patient Visit Completed   Jenel Lucks, LCSW [place]  [facility], [facility] Clinical Social Worker Direct Dial: [phone]  Fax: [phone] Website: [email or web address] 9:29 AM

## 2023-10-15 NOTE — Patient Instructions (Signed)
 Visit Information  Thank you for taking time to visit with me today. Please don't hesitate to contact me if I can be of assistance to you.   Following are the goals we discussed today:   Goals Addressed             This Visit's Progress    LCSW-Plan of Care   On track    Activities and task to complete in order to accomplish goals.   Keep all upcoming appointments discussed today Continue with compliance of taking medication prescribed by Doctor Implement healthy coping skills discussed to assist with management of symptoms Call [employer] [place] to add payment method for transportation service           Our next appointment is by telephone on 4/22 at 1 PM  Please call the care guide team at [phone] if you need to cancel or reschedule your appointment.   If you are experiencing a Mental Health or Behavioral Health Crisis or need someone to talk to, please call the Suicide and Crisis Lifeline: 988 call 911   Patient verbalizes understanding of instructions and care plan provided today and agrees to view in MyChart. Active MyChart status and patient understanding of how to access instructions and care plan via MyChart confirmed with patient.     Windy Fast [facility]  [facility], [facility] Clinical Social Worker Direct Dial: [phone]  Fax: [phone] Website: [email or web address] 9:29 AM

## 2023-10-15 NOTE — Progress Notes (Signed)
 Ms. Sporrer,  The tests looking for intestinal infections were all normal.  If you are still having significant abdominal pain or still passing mucus in your stool, I think we should do a colonoscopy to further investigate the abnormal fecal lactoferrin test.  If your symptoms have resolved, we can hold off on this.    I tried calling you to discuss this and see how your symptoms are doing.  Maya,  Can you please call Ms. Quayle later.  If her symptoms of abdominal pain and mucus in her stools have not improved, let's schedule her for a colonoscopy.

## 2023-10-18 ENCOUNTER — Other Ambulatory Visit: Payer: Self-pay

## 2023-10-21 ENCOUNTER — Ambulatory Visit: Payer: Medicare Other | Admitting: Gastroenterology

## 2023-10-21 ENCOUNTER — Other Ambulatory Visit: Payer: Self-pay

## 2023-10-29 ENCOUNTER — Ambulatory Visit (INDEPENDENT_AMBULATORY_CARE_PROVIDER_SITE_OTHER): Admitting: Family Medicine

## 2023-10-29 ENCOUNTER — Encounter: Payer: Self-pay | Admitting: Family Medicine

## 2023-10-29 VITALS — BP 132/68 | HR 78 | Temp 98.2°F | Ht 60.0 in | Wt 190.1 lb

## 2023-10-29 DIAGNOSIS — J329 Chronic sinusitis, unspecified: Secondary | ICD-10-CM

## 2023-10-29 DIAGNOSIS — B9689 Other specified bacterial agents as the cause of diseases classified elsewhere: Secondary | ICD-10-CM | POA: Diagnosis not present

## 2023-10-29 MED ORDER — DOXYCYCLINE HYCLATE 100 MG PO TABS
100.0000 mg | ORAL_TABLET | Freq: Two times a day (BID) | ORAL | 0 refills | Status: DC
Start: 2023-10-29 — End: 2023-11-21

## 2023-10-29 NOTE — Progress Notes (Signed)
   Subjective:    Patient ID: Tracy [name], female    DOB: 26-Dec-1945, 78 y.o.   MRN: [id number]  HPI URI- sxs started w/ a sore throat ~1 week ago.  Lost her voice.  Started OTC Mucinex Sinus w/ some improvement.  No fever.  Has pressure behind her eyes and in her maxillary sinuses.  + L ear pain.  + 'horrible cough'- cough is intermittently productive of milky sputum.     Review of Systems For ROS see HPI     Objective:   Physical Exam Vitals reviewed.  Constitutional:      General: She is not in acute distress.    Appearance: She is well-developed.  HENT:     Head: Normocephalic and atraumatic.     Right Ear: Tympanic membrane normal.     Left Ear: Tympanic membrane normal.     Nose: Mucosal edema, congestion and rhinorrhea present.     Right Sinus: Maxillary sinus tenderness and frontal sinus tenderness present.     Left Sinus: Maxillary sinus tenderness and frontal sinus tenderness present.     Mouth/Throat:     Mouth: Mucous membranes are moist.     Pharynx: Uvula midline. Posterior oropharyngeal erythema present. No oropharyngeal exudate.     Tonsils: No tonsillar exudate or tonsillar abscesses.  Eyes:     Conjunctiva/sclera: Conjunctivae normal.     Pupils: Pupils are equal, round, and reactive to light.  Cardiovascular:     Rate and Rhythm: Normal rate and regular rhythm.     Heart sounds: Normal heart sounds.  Pulmonary:     Effort: Pulmonary effort is normal. No respiratory distress.     Breath sounds: Normal breath sounds. No wheezing.  Musculoskeletal:     Cervical back: Normal range of motion and neck supple.  Lymphadenopathy:     Cervical: No cervical adenopathy.  Skin:    General: Skin is warm and dry.  Neurological:     General: No focal deficit present.     Mental Status: She is alert and oriented to person, place, and time.     Cranial Nerves: No cranial nerve deficit.     Motor: No weakness.     Coordination: Coordination normal.  Psychiatric:         Mood and Affect: Mood normal.        Behavior: Behavior normal.        Thought Content: Thought content normal.           Assessment & Plan:  Bacterial sinusitis- new.  Pt's sxs and PE consistent w/ infxn.  Suspect this started as a viral/allergy combo and has now progressed to secondary bacterial infxn.  She is very TTP over frontal and maxillary sinuses.  Start Doxycycline.  Reviewed supportive care and red flags that should prompt return.  Pt expressed understanding and is in agreement w/ plan.

## 2023-10-29 NOTE — Patient Instructions (Addendum)
 Follow up as needed or as scheduled START the Doxycycline twice daily- take w/ food Drink LOTS of fluids to rinse the drainage off the back of the throat Robitussin or Delsym as needed for cough REST!! Make sure you are taking your allergy medication daily- Cetirizine (Zyrtec) or Loratadine (Claritin) Call with any questions or concerns Hang in there!!!

## 2023-10-30 LAB — LAB REPORT - SCANNED: Albumin/Creatinine Ratio, Urine, POC: 29.999

## 2023-11-05 ENCOUNTER — Inpatient Hospital Stay: Attending: Hematology

## 2023-11-05 ENCOUNTER — Ambulatory Visit: Admitting: Physician Assistant

## 2023-11-05 ENCOUNTER — Other Ambulatory Visit: Payer: Self-pay

## 2023-11-05 ENCOUNTER — Other Ambulatory Visit

## 2023-11-05 ENCOUNTER — Ambulatory Visit

## 2023-11-05 ENCOUNTER — Inpatient Hospital Stay (HOSPITAL_BASED_OUTPATIENT_CLINIC_OR_DEPARTMENT_OTHER): Admitting: Hematology

## 2023-11-05 VITALS — BP 183/86 | HR 72 | Resp 18 | Ht 60.0 in | Wt 187.7 lb

## 2023-11-05 DIAGNOSIS — Z803 Family history of malignant neoplasm of breast: Secondary | ICD-10-CM | POA: Diagnosis not present

## 2023-11-05 DIAGNOSIS — Z95828 Presence of other vascular implants and grafts: Secondary | ICD-10-CM

## 2023-11-05 DIAGNOSIS — E538 Deficiency of other specified B group vitamins: Secondary | ICD-10-CM | POA: Diagnosis not present

## 2023-11-05 DIAGNOSIS — Z801 Family history of malignant neoplasm of trachea, bronchus and lung: Secondary | ICD-10-CM | POA: Insufficient documentation

## 2023-11-05 DIAGNOSIS — I2510 Atherosclerotic heart disease of native coronary artery without angina pectoris: Secondary | ICD-10-CM | POA: Diagnosis not present

## 2023-11-05 DIAGNOSIS — E1122 Type 2 diabetes mellitus with diabetic chronic kidney disease: Secondary | ICD-10-CM | POA: Insufficient documentation

## 2023-11-05 DIAGNOSIS — C9000 Multiple myeloma not having achieved remission: Secondary | ICD-10-CM | POA: Diagnosis not present

## 2023-11-05 DIAGNOSIS — Z800 Family history of malignant neoplasm of digestive organs: Secondary | ICD-10-CM | POA: Insufficient documentation

## 2023-11-05 DIAGNOSIS — I129 Hypertensive chronic kidney disease with stage 1 through stage 4 chronic kidney disease, or unspecified chronic kidney disease: Secondary | ICD-10-CM | POA: Insufficient documentation

## 2023-11-05 DIAGNOSIS — Z8673 Personal history of transient ischemic attack (TIA), and cerebral infarction without residual deficits: Secondary | ICD-10-CM | POA: Insufficient documentation

## 2023-11-05 DIAGNOSIS — G4733 Obstructive sleep apnea (adult) (pediatric): Secondary | ICD-10-CM | POA: Diagnosis not present

## 2023-11-05 DIAGNOSIS — G2581 Restless legs syndrome: Secondary | ICD-10-CM | POA: Insufficient documentation

## 2023-11-05 DIAGNOSIS — G629 Polyneuropathy, unspecified: Secondary | ICD-10-CM | POA: Insufficient documentation

## 2023-11-05 DIAGNOSIS — Z853 Personal history of malignant neoplasm of breast: Secondary | ICD-10-CM | POA: Insufficient documentation

## 2023-11-05 DIAGNOSIS — Z90710 Acquired absence of both cervix and uterus: Secondary | ICD-10-CM | POA: Diagnosis not present

## 2023-11-05 DIAGNOSIS — D509 Iron deficiency anemia, unspecified: Secondary | ICD-10-CM | POA: Diagnosis not present

## 2023-11-05 DIAGNOSIS — N189 Chronic kidney disease, unspecified: Secondary | ICD-10-CM | POA: Insufficient documentation

## 2023-11-05 DIAGNOSIS — Z923 Personal history of irradiation: Secondary | ICD-10-CM | POA: Insufficient documentation

## 2023-11-05 DIAGNOSIS — Z5112 Encounter for antineoplastic immunotherapy: Secondary | ICD-10-CM | POA: Diagnosis not present

## 2023-11-05 DIAGNOSIS — Z79899 Other long term (current) drug therapy: Secondary | ICD-10-CM | POA: Insufficient documentation

## 2023-11-05 DIAGNOSIS — Z7189 Other specified counseling: Secondary | ICD-10-CM

## 2023-11-05 DIAGNOSIS — E1140 Type 2 diabetes mellitus with diabetic neuropathy, unspecified: Secondary | ICD-10-CM | POA: Diagnosis not present

## 2023-11-05 DIAGNOSIS — I252 Old myocardial infarction: Secondary | ICD-10-CM | POA: Insufficient documentation

## 2023-11-05 DIAGNOSIS — Z87891 Personal history of nicotine dependence: Secondary | ICD-10-CM | POA: Diagnosis not present

## 2023-11-05 DIAGNOSIS — C7951 Secondary malignant neoplasm of bone: Secondary | ICD-10-CM

## 2023-11-05 LAB — CMP (CANCER CENTER ONLY)
ALT: 9 U/L (ref 0–44)
AST: 13 U/L — ABNORMAL LOW (ref 15–41)
Albumin: 4.3 g/dL (ref 3.5–5.0)
Alkaline Phosphatase: 70 U/L (ref 38–126)
Anion gap: 11 (ref 5–15)
BUN: 15 mg/dL (ref 8–23)
CO2: 27 mmol/L (ref 22–32)
Calcium: 9.3 mg/dL (ref 8.9–10.3)
Chloride: 105 mmol/L (ref 98–111)
Creatinine: 1.25 mg/dL — ABNORMAL HIGH (ref 0.44–1.00)
GFR, Estimated: 44 mL/min — ABNORMAL LOW (ref >60)
Glucose, Bld: 116 mg/dL — ABNORMAL HIGH (ref 70–99)
Potassium: 4.1 mmol/L (ref 3.5–5.1)
Sodium: 143 mmol/L (ref 135–145)
Total Bilirubin: 0.3 mg/dL (ref 0.0–1.2)
Total Protein: 6.9 g/dL (ref 6.5–8.1)

## 2023-11-05 LAB — CBC WITH DIFFERENTIAL (CANCER CENTER ONLY)
Abs Immature Granulocytes: 0.01 10*3/uL (ref 0.00–0.07)
Basophils Absolute: 0 10*3/uL (ref 0.0–0.1)
Basophils Relative: 1 %
Eosinophils Absolute: 0 10*3/uL (ref 0.0–0.5)
Eosinophils Relative: 0 %
HCT: 30.8 % — ABNORMAL LOW (ref 36.0–46.0)
Hemoglobin: 10.6 g/dL — ABNORMAL LOW (ref 12.0–15.0)
Immature Granulocytes: 0 %
Lymphocytes Relative: 41 %
Lymphs Abs: 2.3 10*3/uL (ref 0.7–4.0)
MCH: 31.5 pg (ref 26.0–34.0)
MCHC: 34.4 g/dL (ref 30.0–36.0)
MCV: 91.7 fL (ref 80.0–100.0)
Monocytes Absolute: 0.3 10*3/uL (ref 0.1–1.0)
Monocytes Relative: 5 %
Neutro Abs: 3 10*3/uL (ref 1.7–7.7)
Neutrophils Relative %: 53 %
Platelet Count: 309 10*3/uL (ref 150–400)
RBC: 3.36 MIL/uL — ABNORMAL LOW (ref 3.87–5.11)
RDW: 12.8 % (ref 11.5–15.5)
WBC Count: 5.7 10*3/uL (ref 4.0–10.5)
nRBC: 0 % (ref 0.0–0.2)

## 2023-11-05 MED ORDER — SODIUM CHLORIDE 0.9% FLUSH
10.0000 mL | Freq: Once | INTRAVENOUS | Status: AC
  Administered 2023-11-05: 10 mL

## 2023-11-05 MED ORDER — HEPARIN SOD (PORK) LOCK FLUSH 100 UNIT/ML IV SOLN
500.0000 [IU] | Freq: Once | INTRAVENOUS | Status: AC
  Administered 2023-11-05: 500 [IU]

## 2023-11-05 NOTE — Progress Notes (Signed)
 [facility] Follow up:   Date of Service: 11/05/23   Tracy Morita, MD [phone] [address] [place] [address]  CC: Follow-up for continued evaluation and management of multiple myeloma  SUMMARY OF ONCOLOGIC HISTORY: Oncology History  History of right breast cancer  04/16/2000 Surgery   Left breast: Triple negative  invasive ductal cancer treated with lumpectomy, adjuvant chemotherapy, radiation , in [place] , unknown stage   06/07/2015 Mammogram   Right breast mass 6x 6 x 5 mm, right axillary lymph node with slight cortex thickening measured 5 mm    06/13/2015 Initial Diagnosis   Right breast needle biopsy: Invasive ductal carcinoma, grade 1, right axillary lymph node biopsy negative , ER 95%, PR 5%, Ki-67 10%, HER-2 negative   06/13/2015 Clinical Stage   Stage IA: T1b N0   07/07/2015 Surgery   Right lumpectomy: Invasive ductal carcinoma grade 1, 1 cm span, with low-grade DCIS, DCIS focally 0.1 cm to inferior margin, 0/3 lymph nodes negative, ER 95%, PR 5%, HER-2 negative ratio 1.1, Ki-67 10%   07/07/2015 Pathologic Stage   Stage IA: T1c N0   07/13/2015 Procedure   Breast High/Moderate Risk Panel reveals no clinically significant variant at ATM, BRCA1, BRCA2, CDH1, CHEK2, PALB2, PTEN, and TP53.     08/23/2015 - 09/21/2015 Radiation Therapy   Adjuvant Radiation: Right breast/ 42.5Gy at 2.5 Gy per fraction x 17 fractions.   Right breast boost/ 7.5 Gy at 2.5 Gy per fraction x 3 fractions    Anti-estrogen oral therapy   Patient refused antiestrogen therapy   10/20/2015 Survivorship   Survivorship care plan completed and mailed to patient in lieu of in person visit at her request   Iron deficiency anemia  Multiple myeloma (HCC)  01/25/2020 Initial Diagnosis   Multiple myeloma (HCC)   02/11/2020 - 05/19/2020 Adjuvant Chemotherapy   Daratumumab Weekly    02/29/2020 - 03/13/2020 Radiation Therapy   The targets were treated to a total dose of 20 Gy in 10  fractions of 2 Gy each to the left hip and L5 using one plan.   06/02/2020 - 10/05/2020 Adjuvant Chemotherapy   Daratumumab and Carfilzomib   10/19/2020 -  Adjuvant Chemotherapy   Maintenance Daratumumab--initially every 2 weeks, then every 4 weeks.    05/24/2022 -  Chemotherapy   Patient is on Treatment Plan : MYELOMA Daratumumab IV q28d     Malignant neoplasm metastatic to bone (HCC)  02/08/2021 Initial Diagnosis   Bone metastases (HCC)   05/24/2022 -  Chemotherapy   Patient is on Treatment Plan : MYELOMA Daratumumab IV q28d       CURRENT THERAPY: Daratumumab/B12 injection   INTERVAL HISTORY:  Tracy [name] is a 78 y.o. female here for continued evaluation and management of multiple myeloma.   Patient was last seen by me on 09/12/2023 and reported neuropathy issues, sore throat the evening after receiving Darzalex.  Today, she presents in a wheelchair. Patient reports that she has been doing well overall with no new concerns since her last clinical visit.   Patient reports increased p.o. intake and sleeping issues recently.   Patient reports having a receding gum in the left side of her mouth near one of her back teeth, which fluctuates.   She has been tolerating her treatment well with no new or major toxicity issues.   Patient reports having a sore throat. She was seen by a doctor and was thought to have a sinus infection and was  started on antibiotics, though her symptoms worsened. She believes her symptoms may be related to Daratumumab. She denies any other infection issues.   Patient reports a hx of chronic bronchitis and notes that her symptoms are different from this.   Patient reports that she is wanting to go back on steroids. I discussed that steroids help with inflammation and there would not be a role to increase steroid dose.   Patient denies having adequate water intake. She generally drinks 4 ounces of water in the mornings and 16 ounces in the evenings. She  complains that water causes nausea. Patient reports that water has caused nausea since childhood. She notes mineral taste with certain types of water. Patient notes that PA Wyline Hearing did order nausea medication. She reports that she enjoys using water flavoring that is sweet.   Patient regularly drinks green tea and 12 oz soda daily.   Patient is scheduled to receive her next daratumumab and B12 treatment this Friday, 11/07/2023. She is scheduled to receive Aredia next Friday.   Patient Active Problem List   Diagnosis Date Noted   Syncope 07/13/2023   Syncope and collapse 07/13/2023   RLS (restless legs syndrome) 06/12/2022   Malignant neoplasm metastatic to bone (HCC) 02/08/2021   Angiodysplasia of intestine 08/23/2020   Port-A-Cath in place 08/04/2020   Counseling regarding advance care planning and goals of care 02/07/2020   Multiple myeloma (HCC) 01/25/2020   Dizziness 10/12/2019   Post-nasal drainage 10/12/2019   Allergic rhinitis 08/19/2018   Type 2 diabetes mellitus with diabetic neuropathy, unspecified (HCC) 11/05/2017   Encounter for long-term use of muscle relaxants 09/24/2017   CAD in native artery 09/24/2017   OSA (obstructive sleep apnea) 09/24/2017   Snorings 09/24/2017   Asthenia 06/30/2017   Morbid obesity (HCC) 06/02/2017   Depression 06/02/2017   Iron deficiency anemia 09/23/2016   Anemia of chronic disease 09/28/2015   Genetic testing 08/21/2015   History of left breast cancer 07/13/2015   History of right breast cancer 06/20/2015   Hearing loss due to cerumen impaction 12/29/2014   Allergy to adhesive tape 05/19/2014   Hyperlipidemia 12/06/2013   GERD (gastroesophageal reflux disease) 12/06/2013   Cervical disc disease 11/11/2013   Osteopenia 05/25/2013   Allergic asthma 12/24/2012   Insomnia 04/02/2012   Physical exam, annual 04/02/2012   HTN (hypertension) 02/03/2012   Vertigo, benign positional 02/03/2012   Left groin pain 02/03/2012   Hip pain  10/10/2011   TIA (transient ischemic attack) 07/24/2011   Allergic reaction 07/05/2011   Osteoarthrosis involving lower leg 10/19/2010   Disorder of bone and cartilage 05/01/2010   Generalized anxiety disorder 05/01/2010   Vitamin D deficiency 02/20/2010   Unspecified chronic bronchitis (HCC) 08/24/2009   Other lymphedema 10/29/2007    is allergic to bacitracin-neomycin-polymyxin  [neomycin-bacitracin zn-polymyx], nsaids, tape, ambien [zolpidem tartrate], amoxicillin, clavulanic acid, contrast media [iodinated contrast media], latex, prednisone, and tessalon [benzonatate].  MEDICAL HISTORY: Past Medical History:  Diagnosis Date   Angiodysplasia of intestine 08/23/2020   Anxiety    Arthritis    Breast cancer (HCC) 06/13/15   Cancer (HCC) 2000   breast cancer   Chronic bronchitis (HCC)    Chronic bronchitis (HCC)    Hyperlipidemia    Hypertension    Myocardial infarction [place]) 2001   Personal history of radiation therapy    Restless leg    Stroke (HCC) 2004   TIA, no deficits    SURGICAL HISTORY: Past Surgical History:  Procedure Laterality Date  ABDOMINAL HYSTERECTOMY  1985   BREAST LUMPECTOMY Left 2000   radiation and chemo   BREAST LUMPECTOMY Right 2016   radiation   BREAST SURGERY  2001   lt breast lumpectomy   COLONOSCOPY WITH ESOPHAGOGASTRODUODENOSCOPY (EGD)  04/2020   ENTEROSCOPY N/A 03/15/2021   Procedure: ENTEROSCOPY;  Surgeon: Janel Medford, MD;  Location: [facility];  Service: Endoscopy;  Laterality: N/A;   GIVENS CAPSULE STUDY  07/2020   HOT HEMOSTASIS N/A 03/15/2021   Procedure: HOT HEMOSTASIS (ARGON PLASMA COAGULATION/BICAP);  Surgeon: Janel Medford, MD;  Location: [facility];  Service: Endoscopy;  Laterality: N/A;   IR IMAGING GUIDED PORT INSERTION  04/25/2020   RADIOACTIVE SEED GUIDED PARTIAL MASTECTOMY WITH AXILLARY SENTINEL LYMPH NODE BIOPSY Right 07/07/2015   Procedure: RIGHT RADIOACTIVE SEED GUIDED PARTIAL MASTECTOMY WITH AXILLARY  SENTINEL LYMPH NODE BIOPSY;  Surgeon: Lillette Reid III, MD;  Location: [facility];  Service: General;  Laterality: Right;   SMALL INTESTINE SURGERY     TUBAL LIGATION     UPPER GASTROINTESTINAL ENDOSCOPY      SOCIAL HISTORY: Social History   Socioeconomic History   Marital status: Divorced    Spouse name: Not on file   Number of children: 7   Years of education: Not on file   Highest education level: Not on file  Occupational History   Occupation: retired  Tobacco Use   Smoking status: Former    Current packs/day: 0.00    Average packs/day: 1 pack/day for 20.0 years (20.0 ttl pk-yrs)    Types: Cigarettes    Start date: 07/30/1991    Quit date: 07/30/2011    Years since quitting: 12.2   Smokeless tobacco: Never   Tobacco comments:    Quit >4 years ago; 1 ppd for about 5/20 years (remaining was less)  Vaping Use   Vaping status: Former  Substance and Sexual Activity   Alcohol use: No    Alcohol/week: 0.0 standard drinks of alcohol   Drug use: No   Sexual activity: Not Currently  Other Topics Concern   Not on file  Social History Narrative   Lives alone.  Retired.  Education:  11th grade GED.  Children:  7 (one here).    Social Drivers of Health   Financial Resource Strain: High Risk (12/26/2022)   Overall Financial Resource Strain (CARDIA)    Difficulty of Paying Living Expenses: Hard  Food Insecurity: Food Insecurity Present (08/04/2023)   Hunger Vital Sign    Worried About Running Out of Food in the Last Year: Never true    Ran Out of Food in the Last Year: Sometimes true  Transportation Needs: No Transportation Needs (08/04/2023)   PRAPARE - Administrator, Civil Service (Medical): No    Lack of Transportation (Non-Medical): No  Recent Concern: Transportation Needs - Unmet Transportation Needs (07/16/2023)   PRAPARE - Administrator, Civil Service (Medical): Yes    Lack of Transportation (Non-Medical): No  Physical Activity: Inactive  (12/26/2022)   Exercise Vital Sign    Days of Exercise per Week: 0 days    Minutes of Exercise per Session: 0 min  Stress: Stress Concern Present (12/26/2022)   [employer] of Occupational Health - Occupational Stress Questionnaire    Feeling of Stress : To some extent  Social Connections: Moderately Isolated (12/26/2022)   Social Connection and Isolation Panel [NHANES]    Frequency of Communication with Friends and Family: More than three times a week  Frequency of Social Gatherings with Friends and Family: Once a week    Attends Religious Services: More than 4 times per year    Active Member of [employer] or Organizations: No    Attends Banker Meetings: Never    Marital Status: Divorced  Catering manager Violence: Not At Risk (08/04/2023)   Humiliation, Afraid, Rape, and Kick questionnaire    Fear of Current or Ex-Partner: No    Emotionally Abused: No    Physically Abused: No    Sexually Abused: No    FAMILY HISTORY: Family History  Problem Relation Age of Onset   Emphysema Mother 72       smoker   Diabetes Father    Lung cancer Sister        dx. <50; former smoker   Diabetes Brother    Diabetes Brother    Brain cancer Brother 53       unknown tumor type   Diabetes Paternal Aunt    Stroke Maternal Grandmother    Diabetes Paternal Grandmother    Cancer Daughter 45       neck cancer   Other Daughter        hysterectomy for unspecified reason   Colon cancer Daughter    Breast cancer Cousin    Cancer Cousin        unspecified type   Breast cancer Other        triple negative breast cancer in her 59s   Colon polyps Neg Hx    Esophageal cancer Neg Hx    Gallbladder disease Neg Hx    ROS  10 Point review of Systems was done is negative except as noted above.   PHYSICAL EXAMINATION  ECOG PERFORMANCE STATUS: 1 - Symptomatic but completely ambulatory .BP (!) 183/86 (BP Location: Right Arm, Patient Position: Sitting)   Pulse 72   Resp 18   Ht 5' (1.524  m)   Wt 187 lb 11.2 oz (85.1 kg)   SpO2 100%   BMI 36.66 kg/m    GENERAL:alert, in no acute distress and comfortable SKIN: no acute rashes, no significant lesions EYES: conjunctiva are pink and non-injected, sclera anicteric OROPHARYNX: MMM, no exudates, no oropharyngeal erythema or ulceration NECK: supple, no JVD LYMPH:  no palpable lymphadenopathy in the cervical, axillary or inguinal regions LUNGS: clear to auscultation b/l with normal respiratory effort HEART: regular rate & rhythm ABDOMEN:  normoactive bowel sounds , non tender, not distended. Extremity: no pedal edema PSYCH: alert & oriented x 3 with fluent speech NEURO: no focal motor/sensory deficits   LABORATORY DATA: .    Latest Ref Rng & Units 11/05/2023   11:38 AM 10/10/2023   12:44 PM 09/12/2023    9:49 AM  CBC  WBC 4.0 - 10.5 K/uL 5.7  4.9  5.3   Hemoglobin 12.0 - 15.0 g/dL 16.1  09.6  04.5   Hematocrit 36.0 - 46.0 % 30.8  30.2  31.2   Platelets 150 - 400 K/uL 309  267  284    .    Latest Ref Rng & Units 11/05/2023   11:38 AM 10/10/2023   12:44 PM 09/12/2023    9:49 AM  CMP  Glucose 70 - 99 mg/dL 409  811  914   BUN 8 - 23 mg/dL 15  11  12    Creatinine 0.44 - 1.00 mg/dL 7.82  9.56  2.13   Sodium 135 - 145 mmol/L 143  144  143   Potassium 3.5 - 5.1  mmol/L 4.1  3.9  3.9   Chloride 98 - 111 mmol/L 105  108  109   CO2 22 - 32 mmol/L 27  25  26    Calcium 8.9 - 10.3 mg/dL 9.3  9.0  9.2   Total Protein 6.5 - 8.1 g/dL 6.9  6.4  6.3   Total Bilirubin 0.0 - 1.2 mg/dL 0.3  0.3  0.2   Alkaline Phos 38 - 126 U/L 70  70  64   AST 15 - 41 U/L 13  12  12    ALT 0 - 44 U/L 9  9  10      Mammogram 09/23/2022:   ASSESSMENT and THERAPY PLAN:   78 yo female here for follow-up of her multiple myeloma   1)  IgG Kappa Multiple myeloma with bone lesions, anemia, renal insuff. M spike @ 3.7g/dl on diagnosis. 1p deletion, polymorphic variant, 13q deletion Multiple myeloma panel from 06/28/2021 with no M spike.  IFE positive  for IgG kappa monoclonal protein possibly from her daratumumab. Currently on monthly daratumumab 2) h/o DM2 3) Diabetic Neuropathy 4) CKD - likely from DM2, but could have an element of myeloma kidney. 5) h/o TIA and AMI 6) Iron deficiency 7) B12 deficiency   PLAN:  -Discussed lab results on 11/05/23 in detail with patient. CBC showed WBC of 5.7K, hemoglobin of 10.6, and platelets of 309K. -last myeloma panel from 4 weeks ago showed M protein 0.1 g/dL -continue daratumumab once a month -discussed that there is a role for dental evaluation to determine the cause of her gum receding -discussed that if there is a role for dental procedure to address her receding gum, her dentist would need to be aware that she is on Aredia infusions, which would need to be held depending on the procedure.   -she is not inclined to hold Aredia at this time  -recommend initial goal of drinking 32 ounces of water daily, and optimally 64 ounces.  -discussed option of using water flavoring to help prevent nausea or diluting water with a litle bit of apple juice to increase its sweetness.  -educated patient that myeloma can present with four characteristics, including anemia, abnormal proteins in the blood, kidney changes, or high calcium -answered all of patient's questions in detail   Follow-up: Per integrative scheduling MD visit in 8 weeks  The total time spent in the appointment was 30 minutes* .  All of the patient's questions were answered with apparent satisfaction. The patient knows to call the clinic with any problems, questions or concerns.   Jacquelyn Matt MD MS AAHIVMS [facility] Hematology/Oncology Physician [facility]  .*Total Encounter Time as defined by the Centers for Medicare and Medicaid Services includes, in addition to the face-to-face time of a patient visit (documented in the note above) non-face-to-face time: obtaining and reviewing outside history, ordering and reviewing  medications, tests or procedures, care coordination (communications with other health care professionals or caregivers) and documentation in the medical record.    I,Mitra Faeizi,acting as a Neurosurgeon for Jacquelyn Matt, MD.,have documented all relevant documentation on the behalf of Jacquelyn Matt, MD,as directed by  Jacquelyn Matt, MD while in the presence of Jacquelyn Matt, MD.  .I have reviewed the above documentation for accuracy and completeness, and I agree with the above. .Juanjose Mojica Kishore Laramie Meissner MD

## 2023-11-05 NOTE — Progress Notes (Signed)
 Patient seen by Dr. Addison Naegeli are not all within treatment parameters.   BP 183/86 pt will get recheck prior to tx on Friday  Labs reviewed: and are not all within treatment parameters.   Dr Candise Che aware CR: 1.25  Per physician team, patient is ready for treatment and there are NO modifications to the treatment plan. Pt to get tx on Friday 11/07/23

## 2023-11-07 ENCOUNTER — Other Ambulatory Visit: Payer: Medicare Other

## 2023-11-07 ENCOUNTER — Other Ambulatory Visit: Payer: Self-pay

## 2023-11-07 ENCOUNTER — Ambulatory Visit: Payer: Medicare Other

## 2023-11-07 ENCOUNTER — Inpatient Hospital Stay

## 2023-11-07 VITALS — BP 152/72 | HR 73 | Temp 98.3°F | Resp 18

## 2023-11-07 DIAGNOSIS — Z8673 Personal history of transient ischemic attack (TIA), and cerebral infarction without residual deficits: Secondary | ICD-10-CM | POA: Diagnosis not present

## 2023-11-07 DIAGNOSIS — Z803 Family history of malignant neoplasm of breast: Secondary | ICD-10-CM | POA: Diagnosis not present

## 2023-11-07 DIAGNOSIS — C7951 Secondary malignant neoplasm of bone: Secondary | ICD-10-CM

## 2023-11-07 DIAGNOSIS — Z79899 Other long term (current) drug therapy: Secondary | ICD-10-CM | POA: Diagnosis not present

## 2023-11-07 DIAGNOSIS — D509 Iron deficiency anemia, unspecified: Secondary | ICD-10-CM | POA: Diagnosis not present

## 2023-11-07 DIAGNOSIS — Z87891 Personal history of nicotine dependence: Secondary | ICD-10-CM | POA: Diagnosis not present

## 2023-11-07 DIAGNOSIS — Z7189 Other specified counseling: Secondary | ICD-10-CM

## 2023-11-07 DIAGNOSIS — Z800 Family history of malignant neoplasm of digestive organs: Secondary | ICD-10-CM | POA: Diagnosis not present

## 2023-11-07 DIAGNOSIS — E538 Deficiency of other specified B group vitamins: Secondary | ICD-10-CM | POA: Diagnosis not present

## 2023-11-07 DIAGNOSIS — E1140 Type 2 diabetes mellitus with diabetic neuropathy, unspecified: Secondary | ICD-10-CM | POA: Diagnosis not present

## 2023-11-07 DIAGNOSIS — Z5112 Encounter for antineoplastic immunotherapy: Secondary | ICD-10-CM | POA: Diagnosis not present

## 2023-11-07 DIAGNOSIS — I129 Hypertensive chronic kidney disease with stage 1 through stage 4 chronic kidney disease, or unspecified chronic kidney disease: Secondary | ICD-10-CM | POA: Diagnosis not present

## 2023-11-07 DIAGNOSIS — Z853 Personal history of malignant neoplasm of breast: Secondary | ICD-10-CM | POA: Diagnosis not present

## 2023-11-07 DIAGNOSIS — Z801 Family history of malignant neoplasm of trachea, bronchus and lung: Secondary | ICD-10-CM | POA: Diagnosis not present

## 2023-11-07 DIAGNOSIS — G4733 Obstructive sleep apnea (adult) (pediatric): Secondary | ICD-10-CM | POA: Diagnosis not present

## 2023-11-07 DIAGNOSIS — I252 Old myocardial infarction: Secondary | ICD-10-CM | POA: Diagnosis not present

## 2023-11-07 DIAGNOSIS — C9000 Multiple myeloma not having achieved remission: Secondary | ICD-10-CM

## 2023-11-07 DIAGNOSIS — Z923 Personal history of irradiation: Secondary | ICD-10-CM | POA: Diagnosis not present

## 2023-11-07 DIAGNOSIS — I2510 Atherosclerotic heart disease of native coronary artery without angina pectoris: Secondary | ICD-10-CM | POA: Diagnosis not present

## 2023-11-07 DIAGNOSIS — N189 Chronic kidney disease, unspecified: Secondary | ICD-10-CM | POA: Diagnosis not present

## 2023-11-07 DIAGNOSIS — G2581 Restless legs syndrome: Secondary | ICD-10-CM | POA: Diagnosis not present

## 2023-11-07 DIAGNOSIS — G629 Polyneuropathy, unspecified: Secondary | ICD-10-CM | POA: Diagnosis not present

## 2023-11-07 DIAGNOSIS — E1122 Type 2 diabetes mellitus with diabetic chronic kidney disease: Secondary | ICD-10-CM | POA: Diagnosis not present

## 2023-11-07 LAB — MULTIPLE MYELOMA PANEL, SERUM
Albumin SerPl Elph-Mcnc: 3.5 g/dL (ref 2.9–4.4)
Albumin/Glob SerPl: 1.3 (ref 0.7–1.7)
Alpha 1: 0.2 g/dL (ref 0.0–0.4)
Alpha2 Glob SerPl Elph-Mcnc: 1.1 g/dL — ABNORMAL HIGH (ref 0.4–1.0)
B-Globulin SerPl Elph-Mcnc: 1 g/dL (ref 0.7–1.3)
Gamma Glob SerPl Elph-Mcnc: 0.4 g/dL (ref 0.4–1.8)
Globulin, Total: 2.7 g/dL (ref 2.2–3.9)
IgA: 90 mg/dL (ref 64–422)
IgG (Immunoglobin G), Serum: 468 mg/dL — ABNORMAL LOW (ref 586–1602)
IgM (Immunoglobulin M), Srm: 45 mg/dL (ref 26–217)
M Protein SerPl Elph-Mcnc: 0.2 g/dL — ABNORMAL HIGH
Total Protein ELP: 6.2 g/dL (ref 6.0–8.5)

## 2023-11-07 MED ORDER — DIPHENHYDRAMINE HCL 25 MG PO CAPS
50.0000 mg | ORAL_CAPSULE | Freq: Once | ORAL | Status: AC
  Administered 2023-11-07: 50 mg via ORAL
  Filled 2023-11-07: qty 2

## 2023-11-07 MED ORDER — SODIUM CHLORIDE 0.9 % IV SOLN
12.0000 mg | Freq: Once | INTRAVENOUS | Status: AC
  Administered 2023-11-07: 12 mg via INTRAVENOUS
  Filled 2023-11-07: qty 1.2

## 2023-11-07 MED ORDER — SODIUM CHLORIDE 0.9% FLUSH
10.0000 mL | INTRAVENOUS | Status: DC | PRN
Start: 2023-11-07 — End: 2023-11-07
  Administered 2023-11-07: 10 mL

## 2023-11-07 MED ORDER — DARATUMUMAB CHEMO INJECTION 400 MG/20ML
16.0000 mg/kg | Freq: Once | INTRAVENOUS | Status: AC
  Administered 2023-11-07: 1300 mg via INTRAVENOUS
  Filled 2023-11-07: qty 5

## 2023-11-07 MED ORDER — SODIUM CHLORIDE 0.9 % IV SOLN: Freq: Once | INTRAVENOUS | Status: AC

## 2023-11-07 MED ORDER — FAMOTIDINE IN NACL 20-0.9 MG/50ML-% IV SOLN
20.0000 mg | Freq: Once | INTRAVENOUS | Status: AC
  Administered 2023-11-07: 20 mg via INTRAVENOUS
  Filled 2023-11-07: qty 50

## 2023-11-07 MED ORDER — ACETAMINOPHEN 500 MG PO TABS
1000.0000 mg | ORAL_TABLET | Freq: Once | ORAL | Status: AC
  Administered 2023-11-07: 1000 mg via ORAL
  Filled 2023-11-07: qty 2

## 2023-11-07 MED ORDER — HEPARIN SOD (PORK) LOCK FLUSH 100 UNIT/ML IV SOLN
500.0000 [IU] | Freq: Once | INTRAVENOUS | Status: AC | PRN
  Administered 2023-11-07: 500 [IU]

## 2023-11-07 MED ORDER — CYANOCOBALAMIN 1000 MCG/ML IJ SOLN
1000.0000 ug | Freq: Once | INTRAMUSCULAR | Status: AC
  Administered 2023-11-07: 1000 ug via INTRAMUSCULAR
  Filled 2023-11-07: qty 1

## 2023-11-07 NOTE — Patient Instructions (Signed)
 [facility] - A DEPT OF [facility]  Discharge Instructions: Thank you for choosing [facility] to provide your oncology and hematology care.   If you have a lab appointment with the Cancer Center, please go directly to the Cancer Center and check in at the registration area.   Wear comfortable clothing and clothing appropriate for easy access to any Portacath or PICC line.   We strive to give you quality time with your provider. You may need to reschedule your appointment if you arrive late (15 or more minutes).  Arriving late affects you and other patients whose appointments are after yours.  Also, if you miss three or more appointments without notifying the office, you may be dismissed from the clinic at the provider's discretion.      For prescription refill requests, have your pharmacy contact our office and allow 72 hours for refills to be completed.    Today you received the following chemotherapy and/or immunotherapy agents Darzalex      To help prevent nausea and vomiting after your treatment, we encourage you to take your nausea medication as directed.  BELOW ARE SYMPTOMS THAT SHOULD BE REPORTED IMMEDIATELY: *FEVER GREATER THAN 100.4 F (38 C) OR HIGHER *CHILLS OR SWEATING *NAUSEA AND VOMITING THAT IS NOT CONTROLLED WITH YOUR NAUSEA MEDICATION *UNUSUAL SHORTNESS OF BREATH *UNUSUAL BRUISING OR BLEEDING *URINARY PROBLEMS (pain or burning when urinating, or frequent urination) *BOWEL PROBLEMS (unusual diarrhea, constipation, pain near the anus) TENDERNESS IN MOUTH AND THROAT WITH OR WITHOUT PRESENCE OF ULCERS (sore throat, sores in mouth, or a toothache) UNUSUAL RASH, SWELLING OR PAIN  UNUSUAL VAGINAL DISCHARGE OR ITCHING   Items with * indicate a potential emergency and should be followed up as soon as possible or go to the Emergency Department if any problems should occur.  Please show the CHEMOTHERAPY ALERT CARD or IMMUNOTHERAPY  ALERT CARD at check-in to the Emergency Department and triage nurse.  Should you have questions after your visit or need to cancel or reschedule your appointment, please contact [facility] - A DEPT OF [facility]  Dept: [phone]  and follow the prompts.  Office hours are 8:00 a.m. to 4:30 p.m. Monday - Friday. Please note that voicemails left after 4:00 p.m. may not be returned until the following business day.  We are closed weekends and major holidays. You have access to a nurse at all times for urgent questions. Please call the main number to the clinic Dept: [phone] and follow the prompts.   For any non-urgent questions, you may also contact your provider using MyChart. We now offer e-Visits for anyone 20 and older to request care online for non-urgent symptoms. For details visit [email or web address].   Also download the MyChart app! Go to the app store, search "MyChart", open the app, select [place], and log in with your MyChart username and password.

## 2023-11-12 ENCOUNTER — Encounter: Payer: Self-pay | Admitting: Hematology

## 2023-11-14 ENCOUNTER — Inpatient Hospital Stay

## 2023-11-14 VITALS — BP 153/59 | HR 68 | Temp 98.3°F | Resp 18

## 2023-11-14 DIAGNOSIS — Z79899 Other long term (current) drug therapy: Secondary | ICD-10-CM | POA: Diagnosis not present

## 2023-11-14 DIAGNOSIS — Z87891 Personal history of nicotine dependence: Secondary | ICD-10-CM | POA: Diagnosis not present

## 2023-11-14 DIAGNOSIS — I252 Old myocardial infarction: Secondary | ICD-10-CM | POA: Diagnosis not present

## 2023-11-14 DIAGNOSIS — Z923 Personal history of irradiation: Secondary | ICD-10-CM | POA: Diagnosis not present

## 2023-11-14 DIAGNOSIS — I129 Hypertensive chronic kidney disease with stage 1 through stage 4 chronic kidney disease, or unspecified chronic kidney disease: Secondary | ICD-10-CM | POA: Diagnosis not present

## 2023-11-14 DIAGNOSIS — E1140 Type 2 diabetes mellitus with diabetic neuropathy, unspecified: Secondary | ICD-10-CM | POA: Diagnosis not present

## 2023-11-14 DIAGNOSIS — C7951 Secondary malignant neoplasm of bone: Secondary | ICD-10-CM

## 2023-11-14 DIAGNOSIS — E538 Deficiency of other specified B group vitamins: Secondary | ICD-10-CM | POA: Diagnosis not present

## 2023-11-14 DIAGNOSIS — Z5112 Encounter for antineoplastic immunotherapy: Secondary | ICD-10-CM | POA: Diagnosis not present

## 2023-11-14 DIAGNOSIS — Z853 Personal history of malignant neoplasm of breast: Secondary | ICD-10-CM | POA: Diagnosis not present

## 2023-11-14 DIAGNOSIS — Z800 Family history of malignant neoplasm of digestive organs: Secondary | ICD-10-CM | POA: Diagnosis not present

## 2023-11-14 DIAGNOSIS — Z95828 Presence of other vascular implants and grafts: Secondary | ICD-10-CM

## 2023-11-14 DIAGNOSIS — G2581 Restless legs syndrome: Secondary | ICD-10-CM | POA: Diagnosis not present

## 2023-11-14 DIAGNOSIS — Z803 Family history of malignant neoplasm of breast: Secondary | ICD-10-CM | POA: Diagnosis not present

## 2023-11-14 DIAGNOSIS — N189 Chronic kidney disease, unspecified: Secondary | ICD-10-CM | POA: Diagnosis not present

## 2023-11-14 DIAGNOSIS — G4733 Obstructive sleep apnea (adult) (pediatric): Secondary | ICD-10-CM | POA: Diagnosis not present

## 2023-11-14 DIAGNOSIS — C9000 Multiple myeloma not having achieved remission: Secondary | ICD-10-CM | POA: Diagnosis not present

## 2023-11-14 DIAGNOSIS — D509 Iron deficiency anemia, unspecified: Secondary | ICD-10-CM | POA: Diagnosis not present

## 2023-11-14 DIAGNOSIS — Z801 Family history of malignant neoplasm of trachea, bronchus and lung: Secondary | ICD-10-CM | POA: Diagnosis not present

## 2023-11-14 DIAGNOSIS — I2510 Atherosclerotic heart disease of native coronary artery without angina pectoris: Secondary | ICD-10-CM | POA: Diagnosis not present

## 2023-11-14 DIAGNOSIS — G629 Polyneuropathy, unspecified: Secondary | ICD-10-CM | POA: Diagnosis not present

## 2023-11-14 DIAGNOSIS — Z8673 Personal history of transient ischemic attack (TIA), and cerebral infarction without residual deficits: Secondary | ICD-10-CM | POA: Diagnosis not present

## 2023-11-14 DIAGNOSIS — E1122 Type 2 diabetes mellitus with diabetic chronic kidney disease: Secondary | ICD-10-CM | POA: Diagnosis not present

## 2023-11-14 LAB — CMP (CANCER CENTER ONLY)
ALT: 8 U/L (ref 0–44)
AST: 12 U/L — ABNORMAL LOW (ref 15–41)
Albumin: 4.1 g/dL (ref 3.5–5.0)
Alkaline Phosphatase: 64 U/L (ref 38–126)
Anion gap: 9 (ref 5–15)
BUN: 13 mg/dL (ref 8–23)
CO2: 25 mmol/L (ref 22–32)
Calcium: 9.1 mg/dL (ref 8.9–10.3)
Chloride: 109 mmol/L (ref 98–111)
Creatinine: 1.14 mg/dL — ABNORMAL HIGH (ref 0.44–1.00)
GFR, Estimated: 49 mL/min — ABNORMAL LOW (ref >60)
Glucose, Bld: 113 mg/dL — ABNORMAL HIGH (ref 70–99)
Potassium: 3.8 mmol/L (ref 3.5–5.1)
Sodium: 143 mmol/L (ref 135–145)
Total Bilirubin: 0.3 mg/dL (ref 0.0–1.2)
Total Protein: 6.4 g/dL — ABNORMAL LOW (ref 6.5–8.1)

## 2023-11-14 LAB — CBC WITH DIFFERENTIAL (CANCER CENTER ONLY)
Abs Immature Granulocytes: 0.01 10*3/uL (ref 0.00–0.07)
Basophils Absolute: 0 10*3/uL (ref 0.0–0.1)
Basophils Relative: 0 %
Eosinophils Absolute: 0 10*3/uL (ref 0.0–0.5)
Eosinophils Relative: 0 %
HCT: 29.1 % — ABNORMAL LOW (ref 36.0–46.0)
Hemoglobin: 9.8 g/dL — ABNORMAL LOW (ref 12.0–15.0)
Immature Granulocytes: 0 %
Lymphocytes Relative: 40 %
Lymphs Abs: 2.1 10*3/uL (ref 0.7–4.0)
MCH: 30.7 pg (ref 26.0–34.0)
MCHC: 33.7 g/dL (ref 30.0–36.0)
MCV: 91.2 fL (ref 80.0–100.0)
Monocytes Absolute: 0.4 10*3/uL (ref 0.1–1.0)
Monocytes Relative: 7 %
Neutro Abs: 2.7 10*3/uL (ref 1.7–7.7)
Neutrophils Relative %: 53 %
Platelet Count: 232 10*3/uL (ref 150–400)
RBC: 3.19 MIL/uL — ABNORMAL LOW (ref 3.87–5.11)
RDW: 13.3 % (ref 11.5–15.5)
WBC Count: 5.2 10*3/uL (ref 4.0–10.5)
nRBC: 0 % (ref 0.0–0.2)

## 2023-11-14 MED ORDER — SODIUM CHLORIDE 0.9 % IV SOLN: Freq: Once | INTRAVENOUS | Status: AC

## 2023-11-14 MED ORDER — ALTEPLASE 2 MG IJ SOLR
2.0000 mg | Freq: Once | INTRAMUSCULAR | Status: AC
  Administered 2023-11-14: 2 mg
  Filled 2023-11-14: qty 2

## 2023-11-14 MED ORDER — SODIUM CHLORIDE 0.9% FLUSH
10.0000 mL | Freq: Once | INTRAVENOUS | Status: AC
  Administered 2023-11-14: 10 mL

## 2023-11-14 MED ORDER — SODIUM CHLORIDE 0.9 % IV SOLN
60.0000 mg | Freq: Once | INTRAVENOUS | Status: AC
  Administered 2023-11-14: 60 mg via INTRAVENOUS
  Filled 2023-11-14: qty 10
  Filled 2023-11-14: qty 20

## 2023-11-14 NOTE — Patient Instructions (Signed)
 [facility] - A DEPT OF [facility]  Discharge Instructions: Thank you for choosing [facility] to provide your oncology and hematology care.   If you have a lab appointment with the Cancer Center, please go directly to the Cancer Center and check in at the registration area.   Wear comfortable clothing and clothing appropriate for easy access to any Portacath or PICC line.   We strive to give you quality time with your provider. You may need to reschedule your appointment if you arrive late (15 or more minutes).  Arriving late affects you and other patients whose appointments are after yours.  Also, if you miss three or more appointments without notifying the office, you may be dismissed from the clinic at the provider's discretion.      For prescription refill requests, have your pharmacy contact our office and allow 72 hours for refills to be completed.    Today you received the following chemotherapy and/or immunotherapy agents: Areadia      To help prevent nausea and vomiting after your treatment, we encourage you to take your nausea medication as directed.  BELOW ARE SYMPTOMS THAT SHOULD BE REPORTED IMMEDIATELY: *FEVER GREATER THAN 100.4 F (38 C) OR HIGHER *CHILLS OR SWEATING *NAUSEA AND VOMITING THAT IS NOT CONTROLLED WITH YOUR NAUSEA MEDICATION *UNUSUAL SHORTNESS OF BREATH *UNUSUAL BRUISING OR BLEEDING *URINARY PROBLEMS (pain or burning when urinating, or frequent urination) *BOWEL PROBLEMS (unusual diarrhea, constipation, pain near the anus) TENDERNESS IN MOUTH AND THROAT WITH OR WITHOUT PRESENCE OF ULCERS (sore throat, sores in mouth, or a toothache) UNUSUAL RASH, SWELLING OR PAIN  UNUSUAL VAGINAL DISCHARGE OR ITCHING   Items with * indicate a potential emergency and should be followed up as soon as possible or go to the Emergency Department if any problems should occur.  Please show the CHEMOTHERAPY ALERT CARD or IMMUNOTHERAPY  ALERT CARD at check-in to the Emergency Department and triage nurse.  Should you have questions after your visit or need to cancel or reschedule your appointment, please contact [facility] - A DEPT OF [facility]  Dept: [phone]  and follow the prompts.  Office hours are 8:00 a.m. to 4:30 p.m. Monday - Friday. Please note that voicemails left after 4:00 p.m. may not be returned until the following business day.  We are closed weekends and major holidays. You have access to a nurse at all times for urgent questions. Please call the main number to the clinic Dept: [phone] and follow the prompts.   For any non-urgent questions, you may also contact your provider using MyChart. We now offer e-Visits for anyone 21 and older to request care online for non-urgent symptoms. For details visit [email or web address].   Also download the MyChart app! Go to the app store, search "MyChart", open the app, select [place], and log in with your MyChart username and password.

## 2023-11-17 LAB — MULTIPLE MYELOMA PANEL, SERUM
Albumin SerPl Elph-Mcnc: 3.4 g/dL (ref 2.9–4.4)
Albumin/Glob SerPl: 1.4 (ref 0.7–1.7)
Alpha 1: 0.2 g/dL (ref 0.0–0.4)
Alpha2 Glob SerPl Elph-Mcnc: 1.1 g/dL — ABNORMAL HIGH (ref 0.4–1.0)
B-Globulin SerPl Elph-Mcnc: 0.9 g/dL (ref 0.7–1.3)
Gamma Glob SerPl Elph-Mcnc: 0.4 g/dL (ref 0.4–1.8)
Globulin, Total: 2.6 g/dL (ref 2.2–3.9)
IgA: 69 mg/dL (ref 64–422)
IgG (Immunoglobin G), Serum: 425 mg/dL — ABNORMAL LOW (ref 586–1602)
IgM (Immunoglobulin M), Srm: 39 mg/dL (ref 26–217)
M Protein SerPl Elph-Mcnc: 0.1 g/dL — ABNORMAL HIGH
Total Protein ELP: 6 g/dL (ref 6.0–8.5)

## 2023-11-18 ENCOUNTER — Ambulatory Visit: Payer: Self-pay | Admitting: Licensed Clinical Social Worker

## 2023-11-18 NOTE — Patient Instructions (Signed)
 Visit Information  Thank you for taking time to visit with me today. Please don't hesitate to contact me if I can be of assistance to you before our next scheduled appointment.    Please call the care guide team at [phone] if you need to cancel, schedule, or reschedule an appointment.   Please call the Suicide and Crisis Lifeline: 988 if you are experiencing a Mental Health or Behavioral Health Crisis or need someone to talk to.  Hale Level, LCSW [facility], [facility] Licensed Clinical Social Worker Care Coordinator [phone]

## 2023-11-18 NOTE — Patient Outreach (Signed)
 Complex Care Management   Visit Note  11/18/2023  Name:  Tracy [name] MRN: [id number] DOB: 1946-05-06  Situation: Referral received for Complex Care Management related to SDOH Barriers:  Transportation I obtained verbal consent from Patient.  Visit completed with patient  on the phone  Background:   Past Medical History:  Diagnosis Date   Angiodysplasia of intestine 08/23/2020   Anxiety    Arthritis    Breast cancer (HCC) 06/13/15   Cancer (HCC) 2000   breast cancer   Chronic bronchitis (HCC)    Chronic bronchitis (HCC)    Hyperlipidemia    Hypertension    Myocardial infarction [facility]) 2001   Personal history of radiation therapy    Restless leg    Stroke (HCC) 2004   TIA, no deficits    Assessment: Patient Reported Symptoms:  Cognitive Cognitive Status: Alert and oriented to person, place, and time, Normal speech and language skills Cognitive/Intellectual Conditions Management [RPT]: None reported or documented in medical history or problem list   Health Maintenance Behaviors: Annual physical exam, Stress management, Hobbies  Neurological Neurological Review of Symptoms: No symptoms reported    HEENT HEENT Symptoms Reported: No symptoms reported      Cardiovascular Cardiovascular Symptoms Reported: No symptoms reported    Respiratory Respiratory Symptoms Reported: No symptoms reported    Endocrine Patient reports the following symptoms related to hypoglycemia or hyperglycemia : No symptoms reported    Gastrointestinal Gastrointestinal Symptoms Reported: No symptoms reported      Genitourinary Genitourinary Symptoms Reported: No symptoms reported    Integumentary Integumentary Symptoms Reported: No symptoms reported    Musculoskeletal Musculoskelatal Symptoms Reviewed: No symptoms reported        Psychosocial Psychosocial Symptoms Reported: No symptoms reported Behavioral Health Conditions: Depression Behavioral Management Strategies: Coping strategies,  Support system, Medication therapy Behavioral Health Self-Management Outcome: 4 (good) Major Change/Loss/Stressor/Fears (CP): Medical condition, self        07/22/2023   10:44 AM  Depression screen PHQ 2/9  Decreased Interest 0  Down, Depressed, Hopeless 0  PHQ - 2 Score 0  Altered sleeping 3  Tired, decreased energy 3  Change in appetite 0  Feeling bad or failure about yourself  0  Trouble concentrating 3  Moving slowly or fidgety/restless 2  Suicidal thoughts 0  PHQ-9 Score 11    There were no vitals filed for this visit.  Medications Reviewed Today   Medications were not reviewed in this encounter     Recommendation:   Continue to utilize transportation resources as needed. Monitor for changes in mood and utilize coping skills regularly.  Follow Up Plan:   Patient has met all care management goals. Care Management case will be closed. Patient has been provided contact information should new needs arise.   Hale Level, LCSW [facility], [facility] Licensed Clinical Social Worker Care Coordinator [phone]

## 2023-11-19 ENCOUNTER — Telehealth: Payer: Self-pay | Admitting: Gastroenterology

## 2023-11-19 NOTE — Telephone Encounter (Signed)
 Pt taking protonix  bid and still having stomach discomfort. Pt scheduled to see Suzanna Erp PA tomorrow at 10:40am, pt aware of appt.

## 2023-11-19 NOTE — Telephone Encounter (Signed)
 Patient is calling back and stating that she believes her pain is actually coming from her acid reflux and was thinking that maybe her Pantoprazole  was not working for her. Patient is wondering if Dr. Cherryl Corona would prescribe something else for her upper abdominal pain. Patient is requesting a call back. Please advise.

## 2023-11-19 NOTE — Telephone Encounter (Signed)
 Inbound call from patient stating she is scheduled to see Dr. Cherryl Corona on 5/29 at 1:50. Patient called wanting to see if she could be seen sooner, I advised her that at the moment that is the soonest that we had. Patient is requesting a call from the nurse to see if she can be seen sooner or if Dr. Cherryl Corona can give her medication for her pain. Please advise.

## 2023-11-20 ENCOUNTER — Encounter: Payer: Self-pay | Admitting: Gastroenterology

## 2023-11-20 ENCOUNTER — Ambulatory Visit (INDEPENDENT_AMBULATORY_CARE_PROVIDER_SITE_OTHER): Admitting: Gastroenterology

## 2023-11-20 VITALS — BP 138/68 | HR 86 | Ht 60.0 in | Wt 182.4 lb

## 2023-11-20 DIAGNOSIS — R1310 Dysphagia, unspecified: Secondary | ICD-10-CM

## 2023-11-20 DIAGNOSIS — C9000 Multiple myeloma not having achieved remission: Secondary | ICD-10-CM

## 2023-11-20 DIAGNOSIS — K219 Gastro-esophageal reflux disease without esophagitis: Secondary | ICD-10-CM | POA: Diagnosis not present

## 2023-11-20 DIAGNOSIS — E1140 Type 2 diabetes mellitus with diabetic neuropathy, unspecified: Secondary | ICD-10-CM

## 2023-11-20 DIAGNOSIS — R1013 Epigastric pain: Secondary | ICD-10-CM | POA: Diagnosis not present

## 2023-11-20 DIAGNOSIS — R194 Change in bowel habit: Secondary | ICD-10-CM | POA: Diagnosis not present

## 2023-11-20 NOTE — Progress Notes (Signed)
 Chief Complaint: Follow-up of epigastric pain Primary GI MD: Dr. Cherryl Corona  HPI: 78 year old female history of breast cancer (2000 07/2014), multiple myeloma (2021 with associated bone lesions), s/p chemo and radiation, anemia of chronic disease, diabetes, sleep apnea, hypertension, CAD, TIA, intestinal AVMs, presents for follow-up.  Last seen January 2025 by Dr. Cherryl Corona. At that time was having persistent epigastric pain of unclear etiology with negative H. pylori breath test and negative CTA as well as unremarkable liver enzymes and lipase.  Patient had negative gastric emptying study Had positive fecal lactoferrin Negative CRP, celiac, GGT Negative infectious stool studies Negative pancreatic elastase  Patient notes she continues to have this persistent epigastric pain that is worse with eating.  She states anytime that she eats food it "gets stuck" and causes such pain it feels similar to a heart attack.  She also feels "jitters" when she has this experience.  She comes in use to have persistent acid reflux despite pantoprazole  40 Mg twice daily.  In the past she has tried omeprazole , lansoprazole, and Nexium as well as Pepcid .  Per chart review appears she was given Carafate  in the past but patient does not recall this.  Patient spoke about her bowel habit changes which have been going on since January and she notes she has been having small thin stools as well as fecal incontinence with urinating.  She has tried Metamucil and Citrucel in the past without relief.  She also reports abdominal bloating.  Patient adamantly declines colonoscopy at this time.  Patient expresses extreme frustration today with not seeing an MD as her copay "is meant for an MD" and she had to wait multiple months for this appointment.  She wants to make it clear that she will only see an MD moving forward.   PREVIOUS GI WORKUP   Previous GI evaluation include an EGD in October 2021 which showed mild  gastritis and was otherwise negative Colonoscopy October 2021 with a few diverticuli and internal and external hemorrhoids. 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  A repeat EGD in Oct 2022 to evaluate nausea was unremarkable, biopsies showed mild chronic gastritis without H. pylori..  CT abdomen/pelvis January 27, 2023 IMPRESSION: 1. No acute intra-abdominal pathology identified. No definite radiographic explanation for the patient's reported symptoms. No nephroureterolithiasis or obstructive uropathy. 2. Stable lytic lesions throughout the imaged bones. No pathological fracture. 3. Aortic atherosclerosis.  Past Medical History:  Diagnosis Date   Angiodysplasia of intestine 08/23/2020   Anxiety    Arthritis    Breast cancer (HCC) 06/13/15   Cancer (HCC) 2000   breast cancer   Chronic bronchitis (HCC)    Chronic bronchitis (HCC)    Hyperlipidemia    Hypertension    Myocardial infarction [facility]) 2001   Personal history of radiation therapy    Restless leg    Stroke (HCC) 2004   TIA, no deficits    Past Surgical History:  Procedure Laterality Date   ABDOMINAL HYSTERECTOMY  1985   BREAST LUMPECTOMY Left 2000   radiation and chemo   BREAST LUMPECTOMY Right 2016   radiation   BREAST SURGERY  2001   lt breast lumpectomy   COLONOSCOPY WITH ESOPHAGOGASTRODUODENOSCOPY (EGD)  04/2020   ENTEROSCOPY N/A 03/15/2021   Procedure: ENTEROSCOPY;  Surgeon: Janel Medford, MD;  Location: [facility];  Service: Endoscopy;  Laterality: N/A;   GIVENS CAPSULE STUDY  07/2020   HOT HEMOSTASIS N/A 03/15/2021  Procedure: HOT HEMOSTASIS (ARGON PLASMA COAGULATION/BICAP);  Surgeon: Janel Medford, MD;  Location: [facility];  Service: Endoscopy;  Laterality: N/A;   IR IMAGING GUIDED PORT INSERTION  04/25/2020   RADIOACTIVE SEED GUIDED PARTIAL MASTECTOMY WITH AXILLARY SENTINEL LYMPH NODE BIOPSY Right 07/07/2015   Procedure: RIGHT RADIOACTIVE  SEED GUIDED PARTIAL MASTECTOMY WITH AXILLARY SENTINEL LYMPH NODE BIOPSY;  Surgeon: Lillette Reid III, MD;  Location: [facility];  Service: General;  Laterality: Right;   SMALL INTESTINE SURGERY     TUBAL LIGATION     UPPER GASTROINTESTINAL ENDOSCOPY      Current Outpatient Medications  Medication Sig Dispense Refill   acyclovir  (ZOVIRAX ) 400 MG tablet TAKE 1 TABLET BY MOUTH TWICE  DAILY 120 tablet 5   atorvastatin  (LIPITOR) 10 MG tablet TAKE 1 TABLET BY MOUTH DAILY 100 tablet 2   dicyclomine  (BENTYL ) 10 MG capsule Take 1 capsule (10 mg total) by mouth every 6 (six) hours. 30 capsule 1   doxycycline  (VIBRA -TABS) 100 MG tablet Take 1 tablet (100 mg total) by mouth 2 (two) times daily. 20 tablet 0   gabapentin  (NEURONTIN ) 300 MG capsule Take 2 capsules (600 mg total) by mouth 3 (three) times daily. 540 capsule 4   glucose blood (ACCU-CHEK GUIDE) test strip Use as instructed to check sugars 1-2 times daily. 100 each 12   HYDROcodone -acetaminophen  (NORCO/VICODIN) 5-325 MG tablet Take 1 tablet by mouth every 6 (six) hours as needed for moderate pain (pain score 4-6). 20 tablet 0   lidocaine  (LIDODERM ) 5 % Place 1 patch onto the skin daily. Remove & Discard patch within 12 hours or as directed by MD (Patient taking differently: Place 1 patch onto the skin daily as needed (for pain).) 30 patch 0   lidocaine -prilocaine  (EMLA ) cream Apply 1 Application topically as needed (Apply 1 application to port site 1 hour prior to access as needed). 30 g 0   meclizine  (ANTIVERT ) 25 MG tablet Take 1 tablet (25 mg total) by mouth 3 (three) times daily as needed for dizziness. 30 tablet 0   metFORMIN  (GLUCOPHAGE ) 500 MG tablet TAKE 1 TABLET BY MOUTH  TWICE DAILY WITH A MEAL 180 tablet 3   ondansetron  (ZOFRAN ) 8 MG tablet Take 1 tablet (8 mg total) by mouth every 8 (eight) hours as needed for nausea or vomiting. 60 tablet 0   pantoprazole  (PROTONIX ) 40 MG tablet TAKE 1 TABLET BY MOUTH TWICE  DAILY 200 tablet  2   sucralfate  (CARAFATE ) 1 g tablet Take 1 tablet (1 g total) by mouth 4 (four) times daily -  with meals and at bedtime. 120 tablet 0   tiZANidine  (ZANAFLEX ) 4 MG tablet TAKE 1 TABLET BY MOUTH AT  BEDTIME 90 tablet 1   traZODone  (DESYREL ) 100 MG tablet TAKE 1 TABLET BY MOUTH AT  BEDTIME 90 tablet 3   Vitamin D , Ergocalciferol , (DRISDOL ) 1.25 MG (50000 UNIT) CAPS capsule TAKE 1 CAPSULE BY MOUTH ONCE  WEEKLY 15 capsule 2   No current facility-administered medications for this visit.    Allergies as of 11/20/2023 - Review Complete 11/20/2023  Allergen Reaction Noted   Bacitracin-neomycin-polymyxin  [neomycin-bacitracin zn-polymyx] Swelling 12/29/2014   Nsaids Other (See Comments) 01/04/2015   Tape Hives 11/07/2013   Ambien [zolpidem tartrate]  04/02/2012   Amoxicillin  02/03/2012   Clavulanic acid Hives 11/20/2020   Contrast media [iodinated contrast media] Hives 11/06/2013   Latex Swelling 09/14/2014   Prednisone  Swelling 09/05/2014   Tessalon  [benzonatate ] Itching and Rash 08/22/2021  Family History  Problem Relation Age of Onset   Emphysema Mother 23       smoker   Diabetes Father    Lung cancer Sister        dx. <50; former smoker   Diabetes Brother    Diabetes Brother    Brain cancer Brother 66       unknown tumor type   Diabetes Paternal Aunt    Stroke Maternal Grandmother    Diabetes Paternal Grandmother    Cancer Daughter 45       neck cancer   Other Daughter        hysterectomy for unspecified reason   Colon cancer Daughter    Breast cancer Cousin    Cancer Cousin        unspecified type   Breast cancer Other        triple negative breast cancer in her 74s   Colon polyps Neg Hx    Esophageal cancer Neg Hx    Gallbladder disease Neg Hx     Social History   Socioeconomic History   Marital status: Divorced    Spouse name: Not on file   Number of children: 7   Years of education: Not on file   Highest education level: Not on file  Occupational History    Occupation: retired  Tobacco Use   Smoking status: Former    Current packs/day: 0.00    Average packs/day: 1 pack/day for 20.0 years (20.0 ttl pk-yrs)    Types: Cigarettes    Start date: 07/30/1991    Quit date: 07/30/2011    Years since quitting: 12.3   Smokeless tobacco: Never   Tobacco comments:    Quit >4 years ago; 1 ppd for about 5/20 years (remaining was less)  Vaping Use   Vaping status: Former  Substance and Sexual Activity   Alcohol use: No    Alcohol/week: 0.0 standard drinks of alcohol   Drug use: No   Sexual activity: Not Currently  Other Topics Concern   Not on file  Social History Narrative   Lives alone.  Retired.  Education:  11th grade GED.  Children:  7 (one here).    Social Drivers of Health   Financial Resource Strain: High Risk (12/26/2022)   Overall Financial Resource Strain (CARDIA)    Difficulty of Paying Living Expenses: Hard  Food Insecurity: Food Insecurity Present (08/04/2023)   Hunger Vital Sign    Worried About Running Out of Food in the Last Year: Never true    Ran Out of Food in the Last Year: Sometimes true  Transportation Needs: No Transportation Needs (08/04/2023)   PRAPARE - Administrator, Civil Service (Medical): No    Lack of Transportation (Non-Medical): No  Recent Concern: Transportation Needs - Unmet Transportation Needs (07/16/2023)   PRAPARE - Administrator, Civil Service (Medical): Yes    Lack of Transportation (Non-Medical): No  Physical Activity: Inactive (12/26/2022)   Exercise Vital Sign    Days of Exercise per Week: 0 days    Minutes of Exercise per Session: 0 min  Stress: Stress Concern Present (12/26/2022)   [employer] of Occupational Health - Occupational Stress Questionnaire    Feeling of Stress : To some extent  Social Connections: Moderately Isolated (12/26/2022)   Social Connection and Isolation Panel [NHANES]    Frequency of Communication with Friends and Family: More than three times a  week    Frequency of Social Gatherings with  Friends and Family: Once a week    Attends Religious Services: More than 4 times per year    Active Member of [employer] or Organizations: No    Attends Banker Meetings: Never    Marital Status: Divorced  Catering manager Violence: Not At Risk (08/04/2023)   Humiliation, Afraid, Rape, and Kick questionnaire    Fear of Current or Ex-Partner: No    Emotionally Abused: No    Physically Abused: No    Sexually Abused: No    Review of Systems:    Constitutional: No weight loss, fever, chills, weakness or fatigue HEENT: Eyes: No change in vision               Ears, Nose, Throat:  No change in hearing or congestion Skin: No rash or itching Cardiovascular: No chest pain, chest pressure or palpitations   Respiratory: No SOB or cough Gastrointestinal: See HPI and otherwise negative Genitourinary: No dysuria or change in urinary frequency Neurological: No headache, dizziness or syncope Musculoskeletal: No new muscle or joint pain Hematologic: No bleeding or bruising Psychiatric: No history of depression or anxiety    Physical Exam:  Vital signs: BP 138/68   Pulse 86   Ht 5' (1.524 m)   Wt 182 lb 6 oz (82.7 kg)   SpO2 100%   BMI 35.62 kg/m   Constitutional: NAD, alert and cooperative Head:  Normocephalic and atraumatic. Eyes:   PEERL, EOMI. No icterus. Conjunctiva pink. Respiratory: Respirations even and unlabored. Lungs clear to auscultation bilaterally.   No wheezes, crackles, or rhonchi.  Cardiovascular:  Regular rate and rhythm. No peripheral edema, cyanosis or pallor.  Gastrointestinal:  Soft, nondistended, nontender. No rebound or guarding. Normal bowel sounds. No appreciable masses or hepatomegaly. Rectal:  Declines Msk:  Symmetrical without gross deformities. Without edema, no deformity or joint abnormality.  Neurologic:  Alert and  oriented x4;  grossly normal neurologically.  Skin:   Dry and intact without significant  lesions or rashes. Psychiatric: Oriented to person, place and time. Demonstrates good judgement and reason without abnormal affect or behaviors.   RELEVANT LABS AND IMAGING: CBC    Component Value Date/Time   WBC 5.2 11/14/2023 1101   WBC 7.5 07/13/2023 1511   RBC 3.19 (L) 11/14/2023 1101   HGB 9.8 (L) 11/14/2023 1101   HGB 9.7 (L) 07/13/2015 1516   HCT 29.1 (L) 11/14/2023 1101   HCT 30.2 (L) 07/13/2015 1516   PLT 232 11/14/2023 1101   PLT 317 07/13/2015 1516   MCV 91.2 11/14/2023 1101   MCV 86.8 07/13/2015 1516   MCH 30.7 11/14/2023 1101   MCHC 33.7 11/14/2023 1101   RDW 13.3 11/14/2023 1101   RDW 14.5 07/13/2015 1516   LYMPHSABS 2.1 11/14/2023 1101   LYMPHSABS 3.7 (H) 07/13/2015 1516   MONOABS 0.4 11/14/2023 1101   MONOABS 0.5 07/13/2015 1516   EOSABS 0.0 11/14/2023 1101   EOSABS 0.1 07/13/2015 1516   BASOSABS 0.0 11/14/2023 1101   BASOSABS 0.0 07/13/2015 1516    CMP     Component Value Date/Time   NA 143 11/14/2023 1101   NA 139 07/13/2015 1516   K 3.8 11/14/2023 1101   K 3.9 07/13/2015 1516   CL 109 11/14/2023 1101   CO2 25 11/14/2023 1101   CO2 25 07/13/2015 1516   GLUCOSE 113 (H) 11/14/2023 1101   GLUCOSE 85 07/13/2015 1516   BUN 13 11/14/2023 1101   BUN 7.9 07/13/2015 1516   CREATININE 1.14 (H) 11/14/2023 1101  CREATININE 0.9 07/13/2015 1516   CALCIUM  9.1 11/14/2023 1101   CALCIUM  8.9 07/13/2015 1516   PROT 6.4 (L) 11/14/2023 1101   PROT 7.6 07/13/2015 1516   ALBUMIN 4.1 11/14/2023 1101   ALBUMIN 3.3 (L) 07/13/2015 1516   AST 12 (L) 11/14/2023 1101   AST 16 07/13/2015 1516   ALT 8 11/14/2023 1101   ALT 12 07/13/2015 1516   ALKPHOS 64 11/14/2023 1101   ALKPHOS 99 07/13/2015 1516   BILITOT 0.3 11/14/2023 1101   BILITOT <0.30 07/13/2015 1516   GFRNONAA 49 (L) 11/14/2023 1101   GFRNONAA 61 05/10/2014 1147   GFRAA 41 (L) 04/21/2020 1024   GFRAA 70 05/10/2014 1147     Assessment/Plan:   78 year old female history of breast cancer (2000  07/2014), multiple myeloma (2021 with associated bone lesions), s/p chemo and radiation, anemia of chronic disease, diabetes, sleep apnea, hypertension, CAD, TIA, intestinal AVMs, presents for follow-up.  Persistent epigastric pain Dysphagia GERD Negative H. pylori breath test, unrevealing CTA, negative gastric emptying study, negative celiac, negative CRP, negative infectious stool studies, positive fecal lactoferrin.  Last EGD 2022 showed mild chronic gastritis with biopsies negative for H. pylori.  Last colonoscopy in 2021 showed diverticulosis and hemorrhoids.  Small bowel enteroscopy 2021 with jejunal AVM.  Persistent epigastric pain with food "feeling stuck" after eating in her epigastric area with associated GERD.  No improvement on PPI twice daily and has had multiple trials of different PPIs in the past.  Extensive time for discussion with patient about next steps moving forward including EGD/colonoscopy recommendation for upper GI symptoms and positive lactoferrin.  Patient adamantly declines colonoscopy and requests no further discussion with the matter.  I discussed with patient a positive lactoferrin and bowel changes indicates inflammation in her GI tract and it is best to be evaluated endoscopically since all of her past test have been negative.  She continues to decline colonoscopy.  She is agreeable to endoscopy but requests first available provider as she does not want to wait multiple days for procedure.  Discussed trial of Voquezna for persistent GERD/epigastric pain if EGD is unrevealing since she has trialed multiple PPIs and H2 blockers without relief.  DDx includes esophagitis, gastritis, stricture, PUD - Schedule EGD with soonest available provider (Dr. [name] tomorrow) - I thoroughly discussed the procedure with the patient (at bedside) to include nature of the procedure, alternatives, benefits, and risks (including but not limited to bleeding, infection, perforation,  anesthesia/cardiac pulmonary complications).  Patient verbalized understanding and gave verbal consent to proceed with procedure. - Further management per EGD findings - MD only follow-up moving forward per patient request  Bowel habit changes Extensive workup as above with positive lactoferrin.  Extensive discussion with patient about recommendation to pursue colonoscopy.  Patient adamantly declined but expresses frustration with no further evaluation for her bowel habit changes despite declining colonoscopy.  Discussed she has had an extensive negative workup and the next step would be colonoscopy for further evaluation.  Suspect her bowel habit changes of small thin stools and occasional fecal incontinence with urination is likely pelvic floor dysfunction versus constipation, though it would be best to rule out any type of inflammation due to positive lactoferrin - Trial of Benefiber - Squatty potty - Consider pelvic floor physical therapy - Recommended colonoscopy, patient declined. - Reassuring colonoscopy in 2022.  Extensive negative workup thus far  Multiple myeloma Undergoing monthly infusions for multiple myeloma.  Following with oncology.   This visit required 65 minutes of patient  care (this includes precharting, chart review, review of results, face-to-face time used for counseling as well as treatment plan and follow-up. The patient was provided an opportunity to ask questions and all were answered. The patient agreed with the plan and demonstrated an understanding of the instructions.   Gigi Kyle [facility] 11/20/2023, 11:41 AM  Cc: Frankie Israel, MD

## 2023-11-20 NOTE — Patient Instructions (Signed)
 You have been scheduled for an endoscopy. Please follow written instructions given to you at your visit today.  If you use inhalers (even only as needed), please bring them with you on the day of your procedure.  If you take any of the following medications, they will need to be adjusted prior to your procedure:   DO NOT TAKE 7 DAYS PRIOR TO TEST- Trulicity (dulaglutide) Ozempic, Wegovy (semaglutide) Mounjaro (tirzepatide) Bydureon Bcise (exanatide extended release)  DO NOT TAKE 1 DAY PRIOR TO YOUR TEST Rybelsus (semaglutide) Adlyxin (lixisenatide) Victoza (liraglutide) Byetta (exanatide) ___________________________________________________________________________   A high fiber diet with plenty of fluids (up to 8 glasses of water daily) is suggested to relieve these symptoms.  Metamucil, 1 tablespoon once or twice daily can be used to keep bowels regular if needed.  _______________________________________________________  If your blood pressure at your visit was 140/90 or greater, please contact your primary care physician to follow up on this.  _______________________________________________________  If you are age 45 or older, your body mass index should be between 23-30. Your Body mass index is 35.62 kg/m. If this is out of the aforementioned range listed, please consider follow up with your Primary Care Provider.  If you are age 72 or younger, your body mass index should be between 19-25. Your Body mass index is 35.62 kg/m. If this is out of the aformentioned range listed, please consider follow up with your Primary Care Provider.   ________________________________________________________  The [facility] providers would like to encourage you to use MYCHART to communicate with providers for non-urgent requests or questions.  Due to long hold times on the telephone, sending your provider a message by [facility] may be a faster and more efficient way to get a response.  Please allow  48 business hours for a response.  Please remember that this is for non-urgent requests.  _______________________________________________________  Thank you for choosing me and [facility].

## 2023-11-20 NOTE — Progress Notes (Signed)
 Agree with assessment and plan as outlined.  I will assist to perform this patient's endoscopy tomorrow to expedite her workup and evaluation for this issue.

## 2023-11-21 ENCOUNTER — Telehealth: Payer: Self-pay

## 2023-11-21 ENCOUNTER — Encounter: Payer: Self-pay | Admitting: Gastroenterology

## 2023-11-21 ENCOUNTER — Ambulatory Visit: Admitting: Gastroenterology

## 2023-11-21 ENCOUNTER — Other Ambulatory Visit: Payer: Self-pay

## 2023-11-21 VITALS — BP 198/85 | HR 89 | Temp 97.9°F | Resp 12 | Ht 60.0 in | Wt 182.0 lb

## 2023-11-21 DIAGNOSIS — K31819 Angiodysplasia of stomach and duodenum without bleeding: Secondary | ICD-10-CM | POA: Diagnosis not present

## 2023-11-21 DIAGNOSIS — K219 Gastro-esophageal reflux disease without esophagitis: Secondary | ICD-10-CM

## 2023-11-21 DIAGNOSIS — I2510 Atherosclerotic heart disease of native coronary artery without angina pectoris: Secondary | ICD-10-CM | POA: Diagnosis not present

## 2023-11-21 DIAGNOSIS — I10 Essential (primary) hypertension: Secondary | ICD-10-CM | POA: Diagnosis not present

## 2023-11-21 DIAGNOSIS — I252 Old myocardial infarction: Secondary | ICD-10-CM | POA: Diagnosis not present

## 2023-11-21 DIAGNOSIS — K5710 Diverticulosis of small intestine without perforation or abscess without bleeding: Secondary | ICD-10-CM | POA: Diagnosis not present

## 2023-11-21 DIAGNOSIS — R1013 Epigastric pain: Secondary | ICD-10-CM

## 2023-11-21 DIAGNOSIS — R109 Unspecified abdominal pain: Secondary | ICD-10-CM

## 2023-11-21 DIAGNOSIS — R1310 Dysphagia, unspecified: Secondary | ICD-10-CM

## 2023-11-21 MED ORDER — SODIUM CHLORIDE 0.9 % IV SOLN: 500.0000 mL | INTRAVENOUS | Status: DC

## 2023-11-21 NOTE — Progress Notes (Signed)
1403 Robinul 0.1 mg IV given due large amount of secretions upon assessment.  MD made aware, vss

## 2023-11-21 NOTE — Telephone Encounter (Signed)
-----   Message from Ace Holder sent at 11/21/2023  3:28 PM EDT ----- Regarding: RUQ US  Can someone please order this patient a RUQ US , rule out gallstones? thanks

## 2023-11-21 NOTE — Progress Notes (Signed)
 Report given to PACU, vss

## 2023-11-21 NOTE — Progress Notes (Signed)
 Pt's states no medical or surgical changes since previsit or office visit.

## 2023-11-21 NOTE — Patient Instructions (Signed)
 YOU HAD AN ENDOSCOPIC PROCEDURE TODAY AT THE [facility]:   Refer to the procedure report that was given to you for any specific questions about what was found during the examination.  If the procedure report does not answer your questions, please call your gastroenterologist to clarify.  If you requested that your care partner not be given the details of your procedure findings, then the procedure report has been included in a sealed envelope for you to review at your convenience later.  YOU SHOULD EXPECT: Some feelings of bloating in the abdomen. Passage of more gas than usual.  Walking can help get rid of the air that was put into your GI tract during the procedure and reduce the bloating. If you had a lower endoscopy (such as a colonoscopy or flexible sigmoidoscopy) you may notice spotting of blood in your stool or on the toilet paper. If you underwent a bowel prep for your procedure, you may not have a normal bowel movement for a few days.  Please Note:  You might notice some irritation and congestion in your nose or some drainage.  This is from the oxygen used during your procedure.  There is no need for concern and it should clear up in a day or so.  SYMPTOMS TO REPORT IMMEDIATELY:   Following upper endoscopy (EGD)  Vomiting of blood or coffee ground material  New chest pain or pain under the shoulder blades  Painful or persistently difficult swallowing  New shortness of breath  Fever of 100F or higher  Black, tarry-looking stools  For urgent or emergent issues, a gastroenterologist can be reached at any hour by calling [phone]. Do not use MyChart messaging for urgent concerns.    DIET:  We do recommend a small meal at first, but then you may proceed to your regular diet.  Drink plenty of fluids but you should avoid alcoholic beverages for 24 hours.  ACTIVITY:  You should plan to take it easy for the rest of today and you should NOT DRIVE or use heavy machinery  until tomorrow (because of the sedation medicines used during the test).    FOLLOW UP: Our staff will call the number listed on your records the next business day following your procedure.  We will call around 7:15- 8:00 am to check on you and address any questions or concerns that you may have regarding the information given to you following your procedure. If we do not reach you, we will leave a message.     If any biopsies were taken you will be contacted by phone or by letter within the next 1-3 weeks.  Please call us at [phone] if you have not heard about the biopsies in 3 weeks.    SIGNATURES/CONFIDENTIALITY: You and/or your care partner have signed paperwork which will be entered into your electronic medical record.  These signatures attest to the fact that that the information above on your After Visit Summary has been reviewed and is understood.  Full responsibility of the confidentiality of this discharge information lies with you and/or your care-partner.

## 2023-11-21 NOTE — Telephone Encounter (Signed)
 Order entered for us , rad scheduling to contact pt to schedule US .

## 2023-11-21 NOTE — Op Note (Signed)
 [facility] Patient Name: Tracy [name] Procedure Date: 11/21/2023 1:49 PM MRN: [id number] Endoscopist: Tracy [name] , MD, [id number] Age: 78 Referring MD:  Date of Birth: 1945/10/13 Gender: Female Account #: [id number] Procedure:                Upper GI endoscopy Indications:              Epigastric abdominal pain, Dysphagia, suspected                            gastro-esophageal reflux disease - no improvement                            of epigastric pain with multiple PPI Medicines:                Monitored Anesthesia Care Procedure:                Pre-Anesthesia Assessment:                           - Prior to the procedure, a History and Physical                            was performed, and patient medications and                            allergies were reviewed. The patient's tolerance of                            previous anesthesia was also reviewed. The risks                            and benefits of the procedure and the sedation                            options and risks were discussed with the patient.                            All questions were answered, and informed consent                            was obtained. Prior Anticoagulants: The patient has                            taken no anticoagulant or antiplatelet agents. ASA                            Grade Assessment: III - A patient with severe                            systemic disease. After reviewing the risks and                            benefits, the patient was deemed in satisfactory  condition to undergo the procedure.                           After obtaining informed consent, the endoscope was                            passed under direct vision. Throughout the                            procedure, the patient's blood pressure, pulse, and                            oxygen saturations were monitored continuously. The                            GIF [id number]  #[id number] was introduced through the                            mouth, and advanced to the second part of duodenum.                            The upper GI endoscopy was accomplished without                            difficulty. The patient tolerated the procedure                            well. Scope In: Scope Out: Findings:                 Esophagogastric landmarks were identified: the                            Z-line was found at 38 cm, the gastroesophageal                            junction was found at 38 cm and the upper extent of                            the gastric folds was found at 38 cm from the                            incisors.                           The exam of the esophagus was otherwise normal. No                            focal stenosis / stricture appreciated.                           A guidewire was placed and the scope was withdrawn.                            Empiric dilation was  performed in the entire                            esophagus with a Savary dilator with mild                            resistance at 17 mm. Relook endoscopy showed no                            mucosal wrents. Biopsies were obtained from the                            proximal and distal esophagus with cold forceps for                            histology rule out eosinophilic esophagitis.                           The entire examined stomach was normal. Biopsies                            were taken with a cold forceps for Helicobacter                            pylori testing.                           A single small angiodysplastic lesion was found in                            the second portion of the duodenum.                           A diverticulum was found in the second portion of                            the duodenum.                           The exam of the duodenum was otherwise normal. Complications:            No immediate complications. Estimated blood loss:                             Minimal. Estimated Blood Loss:     Estimated blood loss was minimal. Impression:               - Esophagogastric landmarks identified.                           - Normal esophagus otherwise - empiric dilation                            performed for dysphagia and biopsies taken.                           -  Normal stomach. Biopsied.                           - A single angiodysplastic lesion in the duodenum.                           - Duodenal diverticulum.                           - Normal duodenum otherwise.                           Exam is normal without any cause for symptoms. Not                            convinced GERD is causing her symptoms, no relief                            with PPIs. Would recommend RUQ US  to screen for                            gallstones, consider follow up with cardiology to                            ensure noncardiac etiology (albeit sounds atypical                            for that) Recommendation:           - Patient has a contact number available for                            emergencies. The signs and symptoms of potential                            delayed complications were discussed with the                            patient. Return to normal activities tomorrow.                            Written discharge instructions were provided to the                            patient.                           - Resume previous diet.                           - Continue present medications.                           - Await pathology results.                           - Schedule RUQ US                            -  Follow up with cardiology if not yet done                           - Will discuss with Dr. Cherryl [name], primary GI MD Tracy [name]. [name], MD 11/21/2023 2:35:26 PM This report has been signed electronically.

## 2023-11-21 NOTE — Progress Notes (Signed)
 [facility] History and Physical   Primary Care Physician:  Frankie Israel, MD   Reason for Procedure:   Epigastric pain, GERD  Plan:    EGD     HPI: Tracy [name] is a 78 y.o. female  here for EGD to evaluate persistent epigastric pain, GERD, dysphagia.  Seen yesterday in the office by [facility]. Negative GES, CTA negative. Has been on protonix  40mg  BID without relief. Has also failed multiple other PPIs and pepcid . Last EGD 2022. GB in place, has not had a RUQ US . No interval changes since office visit.   Has dysphagia to both liquids and solids in her lower chest.    Otherwise feels well without any cardiopulmonary symptoms.   I have discussed risks / benefits of anesthesia and endoscopic procedure with Lucious Sabina Lockner and they wish to proceed with the exams as outlined today.    Past Medical History:  Diagnosis Date   Angiodysplasia of intestine 08/23/2020   Anxiety    Arthritis    Breast cancer (HCC) 06/13/2015   CAD (coronary artery disease)    Cancer (HCC) 2000   breast cancer   Chronic bronchitis (HCC)    Chronic bronchitis (HCC)    Hyperlipidemia    Hypertension    Myocardial infarction [facility]) 2001   Personal history of radiation therapy    Restless leg    Stroke (HCC) 2004   TIA, no deficits    Past Surgical History:  Procedure Laterality Date   ABDOMINAL HYSTERECTOMY  1985   BREAST LUMPECTOMY Left 2000   radiation and chemo   BREAST LUMPECTOMY Right 2016   radiation   BREAST SURGERY  2001   lt breast lumpectomy   COLONOSCOPY WITH ESOPHAGOGASTRODUODENOSCOPY (EGD)  04/2020   ENTEROSCOPY N/A 03/15/2021   Procedure: ENTEROSCOPY;  Surgeon: Janel Medford, MD;  Location: [facility];  Service: Endoscopy;  Laterality: N/A;   GIVENS CAPSULE STUDY  07/2020   HOT HEMOSTASIS N/A 03/15/2021   Procedure: HOT HEMOSTASIS (ARGON PLASMA COAGULATION/BICAP);  Surgeon: Janel Medford, MD;  Location: [facility];  Service: Endoscopy;   Laterality: N/A;   IR IMAGING GUIDED PORT INSERTION  04/25/2020   RADIOACTIVE SEED GUIDED PARTIAL MASTECTOMY WITH AXILLARY SENTINEL LYMPH NODE BIOPSY Right 07/07/2015   Procedure: RIGHT RADIOACTIVE SEED GUIDED PARTIAL MASTECTOMY WITH AXILLARY SENTINEL LYMPH NODE BIOPSY;  Surgeon: Lillette Reid III, MD;  Location: [facility];  Service: General;  Laterality: Right;   SMALL INTESTINE SURGERY     TUBAL LIGATION     UPPER GASTROINTESTINAL ENDOSCOPY      Prior to Admission medications   Medication Sig Start Date End Date Taking? Authorizing Provider  levocetirizine (XYZAL ) 5 MG tablet Take 5 mg by mouth daily. 11/01/23  Yes [provider]  acyclovir  (ZOVIRAX ) 400 MG tablet TAKE 1 TABLET BY MOUTH TWICE  DAILY 12/02/22   Frankie Israel, MD  atorvastatin  (LIPITOR) 10 MG tablet TAKE 1 TABLET BY MOUTH DAILY 10/10/23   [name], Katherine E, MD  dicyclomine  (BENTYL ) 10 MG capsule Take 1 capsule (10 mg total) by mouth every 6 (six) hours. 10/03/23   Elois Hair, MD  doxycycline  (VIBRA -TABS) 100 MG tablet Take 1 tablet (100 mg total) by mouth 2 (two) times daily. 10/29/23   Jess Morita, MD  gabapentin  (NEURONTIN ) 300 MG capsule Take 2 capsules (600 mg total) by mouth 3 (three) times daily. 01/31/23 04/25/24  [name], Katherine E, MD  glucose blood (ACCU-CHEK GUIDE) test strip  Use as instructed to check sugars 1-2 times daily. 02/10/20   [name], Katherine E, MD  HYDROcodone -acetaminophen  (NORCO/VICODIN) 5-325 MG tablet Take 1 tablet by mouth every 6 (six) hours as needed for moderate pain (pain score 4-6). 10/03/23 10/02/24  Jess Morita, MD  lidocaine  (LIDODERM ) 5 % Place 1 patch onto the skin daily. Remove & Discard patch within 12 hours or as directed by MD Patient taking differently: Place 1 patch onto the skin daily as needed (for pain). 07/03/23   Frankie Israel, MD  lidocaine -prilocaine  (EMLA ) cream Apply 1 Application topically as needed (Apply 1 application to port  site 1 hour prior to access as needed). 07/10/23   Frankie Israel, MD  meclizine  (ANTIVERT ) 25 MG tablet Take 1 tablet (25 mg total) by mouth 3 (three) times daily as needed for dizziness. 03/08/21   Percival Brace, NP  metFORMIN  (GLUCOPHAGE ) 500 MG tablet TAKE 1 TABLET BY MOUTH  TWICE DAILY WITH A MEAL 02/13/23   [name], Katherine E, MD  ondansetron  (ZOFRAN ) 8 MG tablet Take 1 tablet (8 mg total) by mouth every 8 (eight) hours as needed for nausea or vomiting. 01/31/23   Thayil, Irene T, PA-C  pantoprazole  (PROTONIX ) 40 MG tablet TAKE 1 TABLET BY MOUTH TWICE  DAILY 07/07/23   [name], Katherine E, MD  sucralfate  (CARAFATE ) 1 g tablet Take 1 tablet (1 g total) by mouth 4 (four) times daily -  with meals and at bedtime. 07/22/23   [name], Katherine E, MD  tiZANidine  (ZANAFLEX ) 4 MG tablet TAKE 1 TABLET BY MOUTH AT  BEDTIME 04/10/22   [name], Katherine E, MD  traZODone  (DESYREL ) 100 MG tablet TAKE 1 TABLET BY MOUTH AT  BEDTIME 04/01/23   [name], Katherine E, MD  Vitamin D , Ergocalciferol , (DRISDOL ) 1.25 MG (50000 UNIT) CAPS capsule TAKE 1 CAPSULE BY MOUTH ONCE  WEEKLY 08/18/23   Kale, Gautam Kishore, MD    Current Outpatient Medications  Medication Sig Dispense Refill   levocetirizine (XYZAL ) 5 MG tablet Take 5 mg by mouth daily.     acyclovir  (ZOVIRAX ) 400 MG tablet TAKE 1 TABLET BY MOUTH TWICE  DAILY 120 tablet 5   atorvastatin  (LIPITOR) 10 MG tablet TAKE 1 TABLET BY MOUTH DAILY 100 tablet 2   dicyclomine  (BENTYL ) 10 MG capsule Take 1 capsule (10 mg total) by mouth every 6 (six) hours. 30 capsule 1   doxycycline  (VIBRA -TABS) 100 MG tablet Take 1 tablet (100 mg total) by mouth 2 (two) times daily. 20 tablet 0   gabapentin  (NEURONTIN ) 300 MG capsule Take 2 capsules (600 mg total) by mouth 3 (three) times daily. 540 capsule 4   glucose blood (ACCU-CHEK GUIDE) test strip Use as instructed to check sugars 1-2 times daily. 100 each 12   HYDROcodone -acetaminophen  (NORCO/VICODIN) 5-325 MG tablet  Take 1 tablet by mouth every 6 (six) hours as needed for moderate pain (pain score 4-6). 20 tablet 0   lidocaine  (LIDODERM ) 5 % Place 1 patch onto the skin daily. Remove & Discard patch within 12 hours or as directed by MD (Patient taking differently: Place 1 patch onto the skin daily as needed (for pain).) 30 patch 0   lidocaine -prilocaine  (EMLA ) cream Apply 1 Application topically as needed (Apply 1 application to port site 1 hour prior to access as needed). 30 g 0   meclizine  (ANTIVERT ) 25 MG tablet Take 1 tablet (25 mg total) by mouth 3 (three) times daily as needed for dizziness. 30 tablet 0   metFORMIN  (GLUCOPHAGE )  500 MG tablet TAKE 1 TABLET BY MOUTH  TWICE DAILY WITH A MEAL 180 tablet 3   ondansetron  (ZOFRAN ) 8 MG tablet Take 1 tablet (8 mg total) by mouth every 8 (eight) hours as needed for nausea or vomiting. 60 tablet 0   pantoprazole  (PROTONIX ) 40 MG tablet TAKE 1 TABLET BY MOUTH TWICE  DAILY 200 tablet 2   sucralfate  (CARAFATE ) 1 g tablet Take 1 tablet (1 g total) by mouth 4 (four) times daily -  with meals and at bedtime. 120 tablet 0   tiZANidine  (ZANAFLEX ) 4 MG tablet TAKE 1 TABLET BY MOUTH AT  BEDTIME 90 tablet 1   traZODone  (DESYREL ) 100 MG tablet TAKE 1 TABLET BY MOUTH AT  BEDTIME 90 tablet 3   Vitamin D , Ergocalciferol , (DRISDOL ) 1.25 MG (50000 UNIT) CAPS capsule TAKE 1 CAPSULE BY MOUTH ONCE  WEEKLY 15 capsule 2   Current Facility-Administered Medications  Medication Dose Route Frequency Provider Last Rate Last Admin   0.9 %  sodium chloride  infusion  500 mL Intravenous Continuous Taz Vanness, Lendon Queen, MD        Allergies as of 11/21/2023 - Review Complete 11/21/2023  Allergen Reaction Noted   Bacitracin-neomycin-polymyxin  [neomycin-bacitracin zn-polymyx] Swelling 12/29/2014   Nsaids Other (See Comments) 01/04/2015   Tape Hives 11/07/2013   Ambien [zolpidem tartrate] Other (See Comments) 04/02/2012   Amoxicillin Other (See Comments) 02/03/2012   Clavulanic acid Hives  11/20/2020   Contrast media [iodinated contrast media] Hives 11/06/2013   Latex Swelling 09/14/2014   Prednisone  Swelling 09/05/2014   Tessalon  [benzonatate ] Itching and Rash 08/22/2021    Family History  Problem Relation Age of Onset   Emphysema Mother 14       smoker   Diabetes Father    Lung cancer Sister        dx. <50; former smoker   Diabetes Brother    Diabetes Brother    Brain cancer Brother 68       unknown tumor type   Diabetes Paternal Aunt    Stroke Maternal Grandmother    Diabetes Paternal Grandmother    Cancer Daughter 45       neck cancer   Other Daughter        hysterectomy for unspecified reason   Colon cancer Daughter    Breast cancer Cousin    Cancer Cousin        unspecified type   Breast cancer Other        triple negative breast cancer in her 51s   Colon polyps Neg Hx    Esophageal cancer Neg Hx    Gallbladder disease Neg Hx     Social History   Socioeconomic History   Marital status: Divorced    Spouse name: Not on file   Number of children: 7   Years of education: Not on file   Highest education level: Not on file  Occupational History   Occupation: retired  Tobacco Use   Smoking status: Former    Current packs/day: 0.00    Average packs/day: 1 pack/day for 20.0 years (20.0 ttl pk-yrs)    Types: Cigarettes    Start date: 07/30/1991    Quit date: 07/30/2011    Years since quitting: 12.3   Smokeless tobacco: Never   Tobacco comments:    Quit >4 years ago; 1 ppd for about 5/20 years (remaining was less)  Vaping Use   Vaping status: Former  Substance and Sexual Activity   Alcohol use: No  Alcohol/week: 0.0 standard drinks of alcohol   Drug use: No   Sexual activity: Not Currently  Other Topics Concern   Not on file  Social History Narrative   Lives alone.  Retired.  Education:  11th grade GED.  Children:  7 (one here).    Social Drivers of Health   Financial Resource Strain: High Risk (12/26/2022)   Overall Financial Resource  Strain (CARDIA)    Difficulty of Paying Living Expenses: Hard  Food Insecurity: Food Insecurity Present (08/04/2023)   Hunger Vital Sign    Worried About Running Out of Food in the Last Year: Never true    Ran Out of Food in the Last Year: Sometimes true  Transportation Needs: No Transportation Needs (08/04/2023)   PRAPARE - Administrator, Civil Service (Medical): No    Lack of Transportation (Non-Medical): No  Recent Concern: Transportation Needs - Unmet Transportation Needs (07/16/2023)   PRAPARE - Administrator, Civil Service (Medical): Yes    Lack of Transportation (Non-Medical): No  Physical Activity: Inactive (12/26/2022)   Exercise Vital Sign    Days of Exercise per Week: 0 days    Minutes of Exercise per Session: 0 min  Stress: Stress Concern Present (12/26/2022)   [employer] of Occupational Health - Occupational Stress Questionnaire    Feeling of Stress : To some extent  Social Connections: Moderately Isolated (12/26/2022)   Social Connection and Isolation Panel [NHANES]    Frequency of Communication with Friends and Family: More than three times a week    Frequency of Social Gatherings with Friends and Family: Once a week    Attends Religious Services: More than 4 times per year    Active Member of [employer] or Organizations: No    Attends Banker Meetings: Never    Marital Status: Divorced  Catering manager Violence: Not At Risk (08/04/2023)   Humiliation, Afraid, Rape, and Kick questionnaire    Fear of Current or Ex-Partner: No    Emotionally Abused: No    Physically Abused: No    Sexually Abused: No    Review of Systems: All other review of systems negative except as mentioned in the HPI.  Physical Exam: Vital signs BP 136/62   Pulse 75   Temp 97.9 F (36.6 C)   Ht 5' (1.524 m)   Wt 182 lb (82.6 kg)   SpO2 98%   BMI 35.54 kg/m   General:   Alert,  Well-developed, pleasant and cooperative in NAD Lungs:  Clear throughout  to auscultation.   Heart:  Regular rate and rhythm Abdomen:  Soft, nontender and nondistended.   Neuro/Psych:  Alert and cooperative. Normal mood and affect. A and O x 3  Christi Coward, MD [facility]

## 2023-11-23 ENCOUNTER — Other Ambulatory Visit: Payer: Self-pay

## 2023-11-24 ENCOUNTER — Telehealth: Payer: Self-pay | Admitting: *Deleted

## 2023-11-24 NOTE — Telephone Encounter (Signed)
Chart entered in error. SM 

## 2023-11-24 NOTE — Telephone Encounter (Signed)
 Attempted f/u phone call. No answer. Left message.

## 2023-11-24 NOTE — Telephone Encounter (Signed)
 Patient called and stated that she was wanting to know if the CAT scan of her abdomen was also covering her Esophagus. Patient stated that if her scan does not cover her esophagus then she will cancel appointment due to her not having pain in her abdomen. Patient stated that her pain is coming from her esophagus. Patient is requesting a call back. .Please advise.

## 2023-11-24 NOTE — Telephone Encounter (Signed)
 Inbound call from patient, states she has been having diarrhea, pain behind her shoulder blade, and nauseas. Would like to speak to a nurse in regards.

## 2023-11-24 NOTE — Telephone Encounter (Signed)
 Returned  pt's call. She reports that she ate BBQ ribs yesterday at about 5pm. She states she developed vomiting and diarrhea at about 11pm. Vomitted twice and reports that it appeared pink in color. C/o pain under her shoulder blades that worsened after vomiting, though she reports that she was having this pain before the procedure. EGD was on Friday. No symptoms until last night at 11pm. RN instructed pt that this was likely not related to the procedure and just a coincidence that she might have had a stomach virus or an intolerance to the foods she ate. Pt states she has eaten these foods before and has never had any issues. Pt also repeatedly told RN that she "never vomits and never has vomited with any virus she has ever had in her 87 years." Pt has not yet tried to eat anything today but is going to try some cream of wheat. She did drink some tea without any issues so far. RN told pt that if she continues to be concerned she should seek care from her PCP or an Urgent Care. MD will be made aware of complaints and if there are any further recommendations a nurse will call her back to discuss. Pt verbalized understanding.

## 2023-11-24 NOTE — Telephone Encounter (Signed)
 Thanks Camilo Cella, sorry to hear this.  I agree I do not think her episode of vomiting was related to her procedure.  Her exam was pretty unremarkable.  Biopsies pending.  I did order her a right upper quadrant ultrasound and that is scheduled, rule out biliary colic.  She should continue PPI, take Zofran  as needed for nausea, I think she has this at home.  I had told her after the procedure there is a lot of reasons why she could be having stomach pains, she has had a pretty good evaluation and we are proceeding with additional evaluation moving forward.  Gwynneth Lessen, your patient whom I performed endoscopy on last week to expedite her eval

## 2023-11-25 NOTE — Telephone Encounter (Signed)
 Left message for pt to call back

## 2023-11-26 ENCOUNTER — Encounter: Payer: Self-pay | Admitting: Gastroenterology

## 2023-11-26 LAB — SURGICAL PATHOLOGY

## 2023-11-27 NOTE — Telephone Encounter (Signed)
Pt did not return phone call, will await further communication from pt. 

## 2023-11-28 ENCOUNTER — Ambulatory Visit (HOSPITAL_COMMUNITY)

## 2023-12-01 ENCOUNTER — Telehealth: Payer: Self-pay

## 2023-12-05 ENCOUNTER — Inpatient Hospital Stay: Attending: Hematology

## 2023-12-05 ENCOUNTER — Other Ambulatory Visit: Payer: Medicare Other

## 2023-12-05 ENCOUNTER — Other Ambulatory Visit: Payer: Self-pay | Admitting: Hematology

## 2023-12-05 ENCOUNTER — Ambulatory Visit: Payer: Medicare Other

## 2023-12-05 ENCOUNTER — Inpatient Hospital Stay

## 2023-12-05 VITALS — BP 170/69 | HR 71 | Temp 98.9°F | Resp 15 | Wt 189.8 lb

## 2023-12-05 DIAGNOSIS — E1122 Type 2 diabetes mellitus with diabetic chronic kidney disease: Secondary | ICD-10-CM | POA: Insufficient documentation

## 2023-12-05 DIAGNOSIS — Z923 Personal history of irradiation: Secondary | ICD-10-CM | POA: Insufficient documentation

## 2023-12-05 DIAGNOSIS — I252 Old myocardial infarction: Secondary | ICD-10-CM | POA: Diagnosis not present

## 2023-12-05 DIAGNOSIS — N189 Chronic kidney disease, unspecified: Secondary | ICD-10-CM | POA: Insufficient documentation

## 2023-12-05 DIAGNOSIS — G2581 Restless legs syndrome: Secondary | ICD-10-CM | POA: Diagnosis not present

## 2023-12-05 DIAGNOSIS — C7951 Secondary malignant neoplasm of bone: Secondary | ICD-10-CM

## 2023-12-05 DIAGNOSIS — I2510 Atherosclerotic heart disease of native coronary artery without angina pectoris: Secondary | ICD-10-CM | POA: Diagnosis not present

## 2023-12-05 DIAGNOSIS — Z5112 Encounter for antineoplastic immunotherapy: Secondary | ICD-10-CM | POA: Insufficient documentation

## 2023-12-05 DIAGNOSIS — E538 Deficiency of other specified B group vitamins: Secondary | ICD-10-CM | POA: Diagnosis not present

## 2023-12-05 DIAGNOSIS — Z8673 Personal history of transient ischemic attack (TIA), and cerebral infarction without residual deficits: Secondary | ICD-10-CM | POA: Insufficient documentation

## 2023-12-05 DIAGNOSIS — Z7189 Other specified counseling: Secondary | ICD-10-CM

## 2023-12-05 DIAGNOSIS — Z800 Family history of malignant neoplasm of digestive organs: Secondary | ICD-10-CM | POA: Insufficient documentation

## 2023-12-05 DIAGNOSIS — D509 Iron deficiency anemia, unspecified: Secondary | ICD-10-CM | POA: Diagnosis not present

## 2023-12-05 DIAGNOSIS — I129 Hypertensive chronic kidney disease with stage 1 through stage 4 chronic kidney disease, or unspecified chronic kidney disease: Secondary | ICD-10-CM | POA: Diagnosis not present

## 2023-12-05 DIAGNOSIS — E1140 Type 2 diabetes mellitus with diabetic neuropathy, unspecified: Secondary | ICD-10-CM | POA: Diagnosis not present

## 2023-12-05 DIAGNOSIS — Z90710 Acquired absence of both cervix and uterus: Secondary | ICD-10-CM | POA: Diagnosis not present

## 2023-12-05 DIAGNOSIS — Z801 Family history of malignant neoplasm of trachea, bronchus and lung: Secondary | ICD-10-CM | POA: Diagnosis not present

## 2023-12-05 DIAGNOSIS — C9000 Multiple myeloma not having achieved remission: Secondary | ICD-10-CM | POA: Diagnosis not present

## 2023-12-05 DIAGNOSIS — Z79899 Other long term (current) drug therapy: Secondary | ICD-10-CM | POA: Diagnosis not present

## 2023-12-05 DIAGNOSIS — Z87891 Personal history of nicotine dependence: Secondary | ICD-10-CM | POA: Diagnosis not present

## 2023-12-05 DIAGNOSIS — Z853 Personal history of malignant neoplasm of breast: Secondary | ICD-10-CM | POA: Insufficient documentation

## 2023-12-05 DIAGNOSIS — G629 Polyneuropathy, unspecified: Secondary | ICD-10-CM | POA: Diagnosis not present

## 2023-12-05 DIAGNOSIS — Z803 Family history of malignant neoplasm of breast: Secondary | ICD-10-CM | POA: Diagnosis not present

## 2023-12-05 DIAGNOSIS — G4733 Obstructive sleep apnea (adult) (pediatric): Secondary | ICD-10-CM | POA: Diagnosis not present

## 2023-12-05 LAB — CMP (CANCER CENTER ONLY)
ALT: 9 U/L (ref 0–44)
AST: 13 U/L — ABNORMAL LOW (ref 15–41)
Albumin: 4.1 g/dL (ref 3.5–5.0)
Alkaline Phosphatase: 69 U/L (ref 38–126)
Anion gap: 9 (ref 5–15)
BUN: 10 mg/dL (ref 8–23)
CO2: 27 mmol/L (ref 22–32)
Calcium: 9.1 mg/dL (ref 8.9–10.3)
Chloride: 108 mmol/L (ref 98–111)
Creatinine: 1.19 mg/dL — ABNORMAL HIGH (ref 0.44–1.00)
GFR, Estimated: 47 mL/min — ABNORMAL LOW (ref >60)
Glucose, Bld: 115 mg/dL — ABNORMAL HIGH (ref 70–99)
Potassium: 4 mmol/L (ref 3.5–5.1)
Sodium: 144 mmol/L (ref 135–145)
Total Bilirubin: 0.3 mg/dL (ref 0.0–1.2)
Total Protein: 6.5 g/dL (ref 6.5–8.1)

## 2023-12-05 LAB — CBC WITH DIFFERENTIAL (CANCER CENTER ONLY)
Abs Immature Granulocytes: 0 10*3/uL (ref 0.00–0.07)
Basophils Absolute: 0 10*3/uL (ref 0.0–0.1)
Basophils Relative: 1 %
Eosinophils Absolute: 0 10*3/uL (ref 0.0–0.5)
Eosinophils Relative: 1 %
HCT: 29.4 % — ABNORMAL LOW (ref 36.0–46.0)
Hemoglobin: 10 g/dL — ABNORMAL LOW (ref 12.0–15.0)
Immature Granulocytes: 0 %
Lymphocytes Relative: 40 %
Lymphs Abs: 2.2 10*3/uL (ref 0.7–4.0)
MCH: 30.7 pg (ref 26.0–34.0)
MCHC: 34 g/dL (ref 30.0–36.0)
MCV: 90.2 fL (ref 80.0–100.0)
Monocytes Absolute: 0.4 10*3/uL (ref 0.1–1.0)
Monocytes Relative: 7 %
Neutro Abs: 2.8 10*3/uL (ref 1.7–7.7)
Neutrophils Relative %: 51 %
Platelet Count: 346 10*3/uL (ref 150–400)
RBC: 3.26 MIL/uL — ABNORMAL LOW (ref 3.87–5.11)
RDW: 13.1 % (ref 11.5–15.5)
WBC Count: 5.4 10*3/uL (ref 4.0–10.5)
nRBC: 0 % (ref 0.0–0.2)

## 2023-12-05 MED ORDER — SODIUM CHLORIDE 0.9 % IV SOLN: Freq: Once | INTRAVENOUS | Status: AC

## 2023-12-05 MED ORDER — ACETAMINOPHEN 500 MG PO TABS
1000.0000 mg | ORAL_TABLET | Freq: Once | ORAL | Status: AC
Start: 2023-12-05 — End: 2023-12-05
  Administered 2023-12-05: 1000 mg via ORAL
  Filled 2023-12-05: qty 2

## 2023-12-05 MED ORDER — CYANOCOBALAMIN 1000 MCG/ML IJ SOLN
1000.0000 ug | Freq: Once | INTRAMUSCULAR | Status: AC
  Administered 2023-12-05: 1000 ug via INTRAMUSCULAR
  Filled 2023-12-05: qty 1

## 2023-12-05 MED ORDER — SODIUM CHLORIDE 0.9% FLUSH
10.0000 mL | INTRAVENOUS | Status: DC | PRN
  Administered 2023-12-05: 10 mL

## 2023-12-05 MED ORDER — SODIUM CHLORIDE 0.9 % IV SOLN
16.0000 mg/kg | Freq: Once | INTRAVENOUS | Status: AC
  Administered 2023-12-05: 1300 mg via INTRAVENOUS
  Filled 2023-12-05: qty 5

## 2023-12-05 MED ORDER — SODIUM CHLORIDE 0.9 % IV SOLN
12.0000 mg | Freq: Once | INTRAVENOUS | Status: AC
  Administered 2023-12-05: 12 mg via INTRAVENOUS
  Filled 2023-12-05: qty 1.2

## 2023-12-05 MED ORDER — FAMOTIDINE IN NACL 20-0.9 MG/50ML-% IV SOLN
20.0000 mg | Freq: Once | INTRAVENOUS | Status: AC
  Administered 2023-12-05: 20 mg via INTRAVENOUS
  Filled 2023-12-05: qty 50

## 2023-12-05 MED ORDER — DIPHENHYDRAMINE HCL 25 MG PO CAPS
50.0000 mg | ORAL_CAPSULE | Freq: Once | ORAL | Status: AC
Start: 2023-12-05 — End: 2023-12-05
  Administered 2023-12-05: 50 mg via ORAL
  Filled 2023-12-05: qty 2

## 2023-12-05 MED ORDER — HEPARIN SOD (PORK) LOCK FLUSH 100 UNIT/ML IV SOLN
500.0000 [IU] | Freq: Once | INTRAVENOUS | Status: AC | PRN
  Administered 2023-12-05: 500 [IU]

## 2023-12-05 NOTE — Patient Instructions (Signed)

## 2023-12-05 NOTE — Patient Instructions (Signed)
 [facility] - A DEPT OF [facility]  Discharge Instructions: Thank you for choosing [facility] to provide your oncology and hematology care.   If you have a lab appointment with the Cancer Center, please go directly to the Cancer Center and check in at the registration area.   Wear comfortable clothing and clothing appropriate for easy access to any Portacath or PICC line.   We strive to give you quality time with your provider. You may need to reschedule your appointment if you arrive late (15 or more minutes).  Arriving late affects you and other patients whose appointments are after yours.  Also, if you miss three or more appointments without notifying the office, you may be dismissed from the clinic at the provider's discretion.      For prescription refill requests, have your pharmacy contact our office and allow 72 hours for refills to be completed.    Today you received the following chemotherapy and/or immunotherapy agents: Daratumumab.       To help prevent nausea and vomiting after your treatment, we encourage you to take your nausea medication as directed.  BELOW ARE SYMPTOMS THAT SHOULD BE REPORTED IMMEDIATELY: *FEVER GREATER THAN 100.4 F (38 C) OR HIGHER *CHILLS OR SWEATING *NAUSEA AND VOMITING THAT IS NOT CONTROLLED WITH YOUR NAUSEA MEDICATION *UNUSUAL SHORTNESS OF BREATH *UNUSUAL BRUISING OR BLEEDING *URINARY PROBLEMS (pain or burning when urinating, or frequent urination) *BOWEL PROBLEMS (unusual diarrhea, constipation, pain near the anus) TENDERNESS IN MOUTH AND THROAT WITH OR WITHOUT PRESENCE OF ULCERS (sore throat, sores in mouth, or a toothache) UNUSUAL RASH, SWELLING OR PAIN  UNUSUAL VAGINAL DISCHARGE OR ITCHING   Items with * indicate a potential emergency and should be followed up as soon as possible or go to the Emergency Department if any problems should occur.  Please show the CHEMOTHERAPY ALERT CARD or  IMMUNOTHERAPY ALERT CARD at check-in to the Emergency Department and triage nurse.  Should you have questions after your visit or need to cancel or reschedule your appointment, please contact [facility] - A DEPT OF [facility]  Dept: [phone]  and follow the prompts.  Office hours are 8:00 a.m. to 4:30 p.m. Monday - Friday. Please note that voicemails left after 4:00 p.m. may not be returned until the following business day.  We are closed weekends and major holidays. You have access to a nurse at all times for urgent questions. Please call the main number to the clinic Dept: [phone] and follow the prompts.   For any non-urgent questions, you may also contact your provider using MyChart. We now offer e-Visits for anyone 68 and older to request care online for non-urgent symptoms. For details visit [email or web address].   Also download the MyChart app! Go to the app store, search "MyChart", open the app, select [place], and log in with your MyChart username and password.

## 2023-12-06 ENCOUNTER — Other Ambulatory Visit: Payer: Self-pay | Admitting: Hematology

## 2023-12-06 DIAGNOSIS — C7951 Secondary malignant neoplasm of bone: Secondary | ICD-10-CM

## 2023-12-06 DIAGNOSIS — C9000 Multiple myeloma not having achieved remission: Secondary | ICD-10-CM

## 2023-12-08 ENCOUNTER — Encounter: Payer: Self-pay | Admitting: Hematology

## 2023-12-12 LAB — MULTIPLE MYELOMA PANEL, SERUM
Albumin SerPl Elph-Mcnc: 3.5 g/dL (ref 2.9–4.4)
Albumin/Glob SerPl: 1.4 (ref 0.7–1.7)
Alpha 1: 0.2 g/dL (ref 0.0–0.4)
Alpha2 Glob SerPl Elph-Mcnc: 1.2 g/dL — ABNORMAL HIGH (ref 0.4–1.0)
B-Globulin SerPl Elph-Mcnc: 0.9 g/dL (ref 0.7–1.3)
Gamma Glob SerPl Elph-Mcnc: 0.3 g/dL — ABNORMAL LOW (ref 0.4–1.8)
Globulin, Total: 2.6 g/dL (ref 2.2–3.9)
IgA: 81 mg/dL (ref 64–422)
IgG (Immunoglobin G), Serum: 461 mg/dL — ABNORMAL LOW (ref 586–1602)
IgM (Immunoglobulin M), Srm: 51 mg/dL (ref 26–217)
M Protein SerPl Elph-Mcnc: 0.1 g/dL — ABNORMAL HIGH
Total Protein ELP: 6.1 g/dL (ref 6.0–8.5)

## 2023-12-12 NOTE — Telephone Encounter (Signed)
 error

## 2023-12-15 ENCOUNTER — Other Ambulatory Visit: Payer: Self-pay

## 2023-12-15 DIAGNOSIS — C7951 Secondary malignant neoplasm of bone: Secondary | ICD-10-CM

## 2023-12-15 MED ORDER — ACYCLOVIR 400 MG PO TABS: 400.0000 mg | ORAL_TABLET | Freq: Two times a day (BID) | ORAL | 2 refills | Status: DC

## 2023-12-20 ENCOUNTER — Other Ambulatory Visit: Payer: Self-pay | Admitting: Family Medicine

## 2023-12-20 DIAGNOSIS — E1140 Type 2 diabetes mellitus with diabetic neuropathy, unspecified: Secondary | ICD-10-CM

## 2023-12-24 ENCOUNTER — Emergency Department (HOSPITAL_COMMUNITY)

## 2023-12-24 ENCOUNTER — Inpatient Hospital Stay (HOSPITAL_COMMUNITY)
Admission: EM | Admit: 2023-12-24 | Discharge: 2023-12-29 | DRG: 190 | Disposition: A | Discharge status: Home / Self Care | Attending: Family Medicine | Admitting: Family Medicine

## 2023-12-24 ENCOUNTER — Other Ambulatory Visit: Payer: Self-pay

## 2023-12-24 ENCOUNTER — Encounter (HOSPITAL_COMMUNITY): Payer: Self-pay

## 2023-12-24 DIAGNOSIS — E66812 Obesity, class 2: Secondary | ICD-10-CM | POA: Diagnosis present

## 2023-12-24 DIAGNOSIS — B9781 Human metapneumovirus as the cause of diseases classified elsewhere: Secondary | ICD-10-CM | POA: Insufficient documentation

## 2023-12-24 DIAGNOSIS — E1140 Type 2 diabetes mellitus with diabetic neuropathy, unspecified: Secondary | ICD-10-CM | POA: Diagnosis present

## 2023-12-24 DIAGNOSIS — Z7984 Long term (current) use of oral hypoglycemic drugs: Secondary | ICD-10-CM

## 2023-12-24 DIAGNOSIS — E785 Hyperlipidemia, unspecified: Secondary | ICD-10-CM | POA: Diagnosis present

## 2023-12-24 DIAGNOSIS — J439 Emphysema, unspecified: Secondary | ICD-10-CM | POA: Diagnosis not present

## 2023-12-24 DIAGNOSIS — C9001 Multiple myeloma in remission: Secondary | ICD-10-CM | POA: Diagnosis not present

## 2023-12-24 DIAGNOSIS — Z1152 Encounter for screening for COVID-19: Secondary | ICD-10-CM

## 2023-12-24 DIAGNOSIS — R0601 Orthopnea: Secondary | ICD-10-CM | POA: Diagnosis not present

## 2023-12-24 DIAGNOSIS — R531 Weakness: Secondary | ICD-10-CM | POA: Diagnosis present

## 2023-12-24 DIAGNOSIS — J069 Acute upper respiratory infection, unspecified: Secondary | ICD-10-CM | POA: Diagnosis not present

## 2023-12-24 DIAGNOSIS — N1831 Chronic kidney disease, stage 3a: Secondary | ICD-10-CM | POA: Diagnosis not present

## 2023-12-24 DIAGNOSIS — J208 Acute bronchitis due to other specified organisms: Secondary | ICD-10-CM | POA: Diagnosis not present

## 2023-12-24 DIAGNOSIS — C9000 Multiple myeloma not having achieved remission: Secondary | ICD-10-CM | POA: Diagnosis present

## 2023-12-24 DIAGNOSIS — R059 Cough, unspecified: Secondary | ICD-10-CM | POA: Diagnosis not present

## 2023-12-24 DIAGNOSIS — K219 Gastro-esophageal reflux disease without esophagitis: Secondary | ICD-10-CM | POA: Diagnosis not present

## 2023-12-24 DIAGNOSIS — I2510 Atherosclerotic heart disease of native coronary artery without angina pectoris: Secondary | ICD-10-CM | POA: Diagnosis not present

## 2023-12-24 DIAGNOSIS — J432 Centrilobular emphysema: Secondary | ICD-10-CM | POA: Diagnosis not present

## 2023-12-24 DIAGNOSIS — J441 Chronic obstructive pulmonary disease with (acute) exacerbation: Secondary | ICD-10-CM | POA: Diagnosis not present

## 2023-12-24 DIAGNOSIS — E119 Type 2 diabetes mellitus without complications: Secondary | ICD-10-CM | POA: Diagnosis not present

## 2023-12-24 DIAGNOSIS — J440 Chronic obstructive pulmonary disease with acute lower respiratory infection: Secondary | ICD-10-CM | POA: Diagnosis not present

## 2023-12-24 DIAGNOSIS — J123 Human metapneumovirus pneumonia: Secondary | ICD-10-CM | POA: Diagnosis present

## 2023-12-24 DIAGNOSIS — G2581 Restless legs syndrome: Secondary | ICD-10-CM | POA: Diagnosis not present

## 2023-12-24 DIAGNOSIS — I10 Essential (primary) hypertension: Secondary | ICD-10-CM | POA: Diagnosis not present

## 2023-12-24 DIAGNOSIS — Z6836 Body mass index (BMI) 36.0-36.9, adult: Secondary | ICD-10-CM

## 2023-12-24 DIAGNOSIS — E1165 Type 2 diabetes mellitus with hyperglycemia: Secondary | ICD-10-CM | POA: Diagnosis not present

## 2023-12-24 DIAGNOSIS — T380X5A Adverse effect of glucocorticoids and synthetic analogues, initial encounter: Secondary | ICD-10-CM | POA: Diagnosis present

## 2023-12-24 DIAGNOSIS — Z87891 Personal history of nicotine dependence: Secondary | ICD-10-CM

## 2023-12-24 DIAGNOSIS — R0789 Other chest pain: Secondary | ICD-10-CM | POA: Diagnosis not present

## 2023-12-24 DIAGNOSIS — R140 Abdominal distension (gaseous): Secondary | ICD-10-CM | POA: Diagnosis not present

## 2023-12-24 DIAGNOSIS — I252 Old myocardial infarction: Secondary | ICD-10-CM | POA: Diagnosis not present

## 2023-12-24 DIAGNOSIS — E1122 Type 2 diabetes mellitus with diabetic chronic kidney disease: Secondary | ICD-10-CM | POA: Diagnosis present

## 2023-12-24 DIAGNOSIS — E782 Mixed hyperlipidemia: Secondary | ICD-10-CM | POA: Diagnosis not present

## 2023-12-24 DIAGNOSIS — I509 Heart failure, unspecified: Secondary | ICD-10-CM | POA: Diagnosis not present

## 2023-12-24 DIAGNOSIS — R051 Acute cough: Secondary | ICD-10-CM | POA: Diagnosis not present

## 2023-12-24 LAB — CBC WITH DIFFERENTIAL/PLATELET
Abs Immature Granulocytes: 0.01 10*3/uL (ref 0.00–0.07)
Basophils Absolute: 0 10*3/uL (ref 0.0–0.1)
Basophils Relative: 0 %
Eosinophils Absolute: 0 10*3/uL (ref 0.0–0.5)
Eosinophils Relative: 1 %
HCT: 29.8 % — ABNORMAL LOW (ref 36.0–46.0)
Hemoglobin: 9.8 g/dL — ABNORMAL LOW (ref 12.0–15.0)
Immature Granulocytes: 0 %
Lymphocytes Relative: 33 %
Lymphs Abs: 1.8 10*3/uL (ref 0.7–4.0)
MCH: 30.7 pg (ref 26.0–34.0)
MCHC: 32.9 g/dL (ref 30.0–36.0)
MCV: 93.4 fL (ref 80.0–100.0)
Monocytes Absolute: 0.4 10*3/uL (ref 0.1–1.0)
Monocytes Relative: 8 %
Neutro Abs: 3.1 10*3/uL (ref 1.7–7.7)
Neutrophils Relative %: 58 %
Platelets: 231 10*3/uL (ref 150–400)
RBC: 3.19 MIL/uL — ABNORMAL LOW (ref 3.87–5.11)
RDW: 13.2 % (ref 11.5–15.5)
WBC: 5.4 10*3/uL (ref 4.0–10.5)
nRBC: 0 % (ref 0.0–0.2)

## 2023-12-24 LAB — BASIC METABOLIC PANEL WITH GFR
Anion gap: 12 (ref 5–15)
BUN: 11 mg/dL (ref 8–23)
CO2: 23 mmol/L (ref 22–32)
Calcium: 8.6 mg/dL — ABNORMAL LOW (ref 8.9–10.3)
Chloride: 105 mmol/L (ref 98–111)
Creatinine, Ser: 1.13 mg/dL — ABNORMAL HIGH (ref 0.44–1.00)
GFR, Estimated: 49 mL/min — ABNORMAL LOW (ref >60)
Glucose, Bld: 121 mg/dL — ABNORMAL HIGH (ref 70–99)
Potassium: 3.8 mmol/L (ref 3.5–5.1)
Sodium: 140 mmol/L (ref 135–145)

## 2023-12-24 LAB — RESP PANEL BY RT-PCR (RSV, FLU A&B, COVID)  RVPGX2
Influenza A by PCR: NEGATIVE
Influenza B by PCR: NEGATIVE
Resp Syncytial Virus by PCR: NEGATIVE
SARS Coronavirus 2 by RT PCR: NEGATIVE

## 2023-12-24 LAB — GLUCOSE, CAPILLARY
Glucose-Capillary: 198 mg/dL — ABNORMAL HIGH (ref 70–99)
Glucose-Capillary: 330 mg/dL — ABNORMAL HIGH (ref 70–99)
Glucose-Capillary: 291 mg/dL — ABNORMAL HIGH (ref 70–99)

## 2023-12-24 MED ORDER — DEXAMETHASONE 4 MG PO TABS
6.0000 mg | ORAL_TABLET | Freq: Every day | ORAL | Status: DC
  Administered 2023-12-25 – 2023-12-27 (×3): 6 mg via ORAL
  Filled 2023-12-24 (×3): qty 2

## 2023-12-24 MED ORDER — ACYCLOVIR 400 MG PO TABS
400.0000 mg | ORAL_TABLET | Freq: Two times a day (BID) | ORAL | Status: DC
  Administered 2023-12-24 – 2023-12-29 (×11): 400 mg via ORAL
  Filled 2023-12-24 (×11): qty 1

## 2023-12-24 MED ORDER — ATORVASTATIN CALCIUM 10 MG PO TABS
10.0000 mg | ORAL_TABLET | Freq: Every day | ORAL | Status: DC
  Administered 2023-12-24 – 2023-12-28 (×5): 10 mg via ORAL
  Filled 2023-12-24 (×5): qty 1

## 2023-12-24 MED ORDER — IPRATROPIUM-ALBUTEROL 0.5-2.5 (3) MG/3ML IN SOLN
3.0000 mL | RESPIRATORY_TRACT | Status: AC
  Administered 2023-12-24 (×3): 3 mL via RESPIRATORY_TRACT
  Filled 2023-12-24: qty 3
  Filled 2023-12-24: qty 6

## 2023-12-24 MED ORDER — ALBUTEROL SULFATE (2.5 MG/3ML) 0.083% IN NEBU
2.5000 mg | INHALATION_SOLUTION | RESPIRATORY_TRACT | Status: DC | PRN
  Administered 2023-12-26 – 2023-12-27 (×4): 2.5 mg via RESPIRATORY_TRACT
  Filled 2023-12-24 (×4): qty 3

## 2023-12-24 MED ORDER — IPRATROPIUM-ALBUTEROL 0.5-2.5 (3) MG/3ML IN SOLN
3.0000 mL | Freq: Three times a day (TID) | RESPIRATORY_TRACT | Status: DC
  Administered 2023-12-25 – 2023-12-29 (×13): 3 mL via RESPIRATORY_TRACT
  Filled 2023-12-24 (×13): qty 3

## 2023-12-24 MED ORDER — IPRATROPIUM-ALBUTEROL 0.5-2.5 (3) MG/3ML IN SOLN
3.0000 mL | Freq: Four times a day (QID) | RESPIRATORY_TRACT | Status: DC
  Administered 2023-12-24 (×3): 3 mL via RESPIRATORY_TRACT
  Filled 2023-12-24 (×3): qty 3

## 2023-12-24 MED ORDER — LIDOCAINE 5 % EX PTCH
1.0000 | MEDICATED_PATCH | CUTANEOUS | Status: DC
  Administered 2023-12-24 – 2023-12-28 (×5): 1 via TRANSDERMAL
  Filled 2023-12-24 (×5): qty 1

## 2023-12-24 MED ORDER — ACETAMINOPHEN 325 MG PO TABS
650.0000 mg | ORAL_TABLET | Freq: Four times a day (QID) | ORAL | Status: DC | PRN
  Administered 2023-12-25: 650 mg via ORAL
  Filled 2023-12-24: qty 2

## 2023-12-24 MED ORDER — GABAPENTIN 300 MG PO CAPS
600.0000 mg | ORAL_CAPSULE | Freq: Three times a day (TID) | ORAL | Status: DC
  Administered 2023-12-24 – 2023-12-29 (×15): 600 mg via ORAL
  Filled 2023-12-24 (×15): qty 2

## 2023-12-24 MED ORDER — ONDANSETRON HCL 4 MG/2ML IJ SOLN: 4.0000 mg | Freq: Four times a day (QID) | INTRAMUSCULAR | Status: DC | PRN

## 2023-12-24 MED ORDER — INSULIN ASPART 100 UNIT/ML IJ SOLN
0.0000 [IU] | Freq: Every day | INTRAMUSCULAR | Status: DC
  Administered 2023-12-24: 3 [IU] via SUBCUTANEOUS
  Administered 2023-12-25: 2 [IU] via SUBCUTANEOUS
  Administered 2023-12-26 – 2023-12-27 (×2): 3 [IU] via SUBCUTANEOUS

## 2023-12-24 MED ORDER — ONDANSETRON HCL 4 MG PO TABS: 4.0000 mg | ORAL_TABLET | Freq: Four times a day (QID) | ORAL | Status: DC | PRN

## 2023-12-24 MED ORDER — ATORVASTATIN CALCIUM 10 MG PO TABS
10.0000 mg | ORAL_TABLET | Freq: Every day | ORAL | Status: DC
  Filled 2023-12-24: qty 1

## 2023-12-24 MED ORDER — ACETAMINOPHEN 650 MG RE SUPP: 650.0000 mg | Freq: Four times a day (QID) | RECTAL | Status: DC | PRN

## 2023-12-24 MED ORDER — PANTOPRAZOLE SODIUM 40 MG PO TBEC
40.0000 mg | DELAYED_RELEASE_TABLET | Freq: Every day | ORAL | Status: DC
  Administered 2023-12-24 – 2023-12-29 (×6): 40 mg via ORAL
  Filled 2023-12-24 (×7): qty 1

## 2023-12-24 MED ORDER — INSULIN ASPART 100 UNIT/ML IJ SOLN
0.0000 [IU] | Freq: Three times a day (TID) | INTRAMUSCULAR | Status: DC
  Administered 2023-12-24: 11 [IU] via SUBCUTANEOUS
  Administered 2023-12-24 – 2023-12-25 (×2): 3 [IU] via SUBCUTANEOUS
  Administered 2023-12-25 – 2023-12-26 (×3): 2 [IU] via SUBCUTANEOUS
  Administered 2023-12-26: 5 [IU] via SUBCUTANEOUS
  Administered 2023-12-27: 11 [IU] via SUBCUTANEOUS
  Administered 2023-12-27 (×2): 2 [IU] via SUBCUTANEOUS
  Administered 2023-12-28: 3 [IU] via SUBCUTANEOUS
  Administered 2023-12-28: 5 [IU] via SUBCUTANEOUS
  Administered 2023-12-29: 1 [IU] via SUBCUTANEOUS

## 2023-12-24 MED ORDER — DEXAMETHASONE SODIUM PHOSPHATE 10 MG/ML IJ SOLN
10.0000 mg | Freq: Once | INTRAMUSCULAR | Status: AC
  Administered 2023-12-24: 10 mg via INTRAVENOUS
  Filled 2023-12-24: qty 1

## 2023-12-24 MED ORDER — TIZANIDINE HCL 4 MG PO TABS
4.0000 mg | ORAL_TABLET | Freq: Every day | ORAL | Status: DC
  Administered 2023-12-24 – 2023-12-28 (×5): 4 mg via ORAL
  Filled 2023-12-24: qty 1
  Filled 2023-12-24: qty 2
  Filled 2023-12-24 (×3): qty 1

## 2023-12-24 MED ORDER — ENOXAPARIN SODIUM 40 MG/0.4ML IJ SOSY
40.0000 mg | PREFILLED_SYRINGE | INTRAMUSCULAR | Status: DC
  Administered 2023-12-24 – 2023-12-28 (×5): 40 mg via SUBCUTANEOUS
  Filled 2023-12-24 (×5): qty 0.4

## 2023-12-24 MED ORDER — SUCRALFATE 1 G PO TABS
1.0000 g | ORAL_TABLET | Freq: Three times a day (TID) | ORAL | Status: DC
  Administered 2023-12-24 – 2023-12-29 (×20): 1 g via ORAL
  Filled 2023-12-24 (×20): qty 1

## 2023-12-24 MED ORDER — HYDRALAZINE HCL 20 MG/ML IJ SOLN
5.0000 mg | Freq: Four times a day (QID) | INTRAMUSCULAR | Status: DC | PRN
  Administered 2023-12-26: 5 mg via INTRAVENOUS
  Filled 2023-12-24: qty 1

## 2023-12-24 MED ORDER — LORATADINE 10 MG PO TABS
10.0000 mg | ORAL_TABLET | Freq: Every day | ORAL | Status: DC
  Administered 2023-12-24 – 2023-12-29 (×6): 10 mg via ORAL
  Filled 2023-12-24 (×6): qty 1

## 2023-12-24 MED ORDER — ACETAMINOPHEN 325 MG PO TABS
650.0000 mg | ORAL_TABLET | Freq: Once | ORAL | Status: AC
  Administered 2023-12-24: 650 mg via ORAL
  Filled 2023-12-24: qty 2

## 2023-12-24 NOTE — Care Management Obs Status (Signed)
 MEDICARE OBSERVATION STATUS NOTIFICATION   Patient Details  Name: Tracy [name] MRN: [id number] Date of Birth: 16-Jun-1946   Medicare Observation Status Notification Given:  Yes    Kathryn Parish, RN 12/24/2023, 3:23 PM

## 2023-12-24 NOTE — TOC Initial Note (Signed)
 Transition of Care [facility]) - Initial/Assessment Note    Patient Details  Name: Tracy [name] MRN: [id number] Date of Birth: [birth year]  Transition of Care [facility]) CM/SW Contact:    Kathryn Parish, RN Phone Number: 12/24/2023,   Clinical Narrative:                 CM spoke with patient in he room. Lives in apartment alone; drives to appointment; PCP/insurance verified; DME - cane, rollator, BSC; No HH, oxygen or SDOH needs; Patients granddaughter will transport home at discharge. TOC will follow.  Expected Discharge Plan: Home/Self Care Barriers to Discharge: Continued Medical Work up   Patient Goals and CMS Choice Patient states their goals for this hospitalization and ongoing recovery are:: home with self   Choice offered to / list presented to : NA      Expected Discharge Plan and Services In-house Referral: NA Discharge Planning Services: NA Post Acute Care Choice: NA Living arrangements for the past 2 months: Apartment                 DME Arranged: N/A DME Agency: NA       HH Arranged: NA HH Agency: NA        Prior Living Arrangements/Services Living arrangements for the past 2 months: Apartment Lives with:: Self Patient language and need for interpreter reviewed:: Yes Do you feel safe going back to the place where you live?: Yes      Need for Family Participation in Patient Care: Yes (Comment) Care giver support system in place?: Yes (comment) Current home services: DME (cane, rollaor, BSC - [employer]) Criminal Activity/Legal Involvement Pertinent to Current Situation/Hospitalization: No - Comment as needed  Activities of Daily Living   ADL Screening (condition at time of admission) Independently performs ADLs?: Yes (appropriate for developmental age) Is the patient deaf or have difficulty hearing?: No Does the patient have difficulty seeing, even when wearing glasses/contacts?: Yes (glasses) Does the patient have difficulty concentrating, remembering, or  making decisions?: No  Permission Sought/Granted Permission sought to share information with : Case Manager Permission granted to share information with : Yes, Verbal Permission Granted  Share Information with NAME: Natasha Zencrato     Permission granted to share info w Relationship: Grand daughter  Permission granted to share info w Contact Information: [phone]  Emotional Assessment Appearance:: Appears stated age Attitude/Demeanor/Rapport: Engaged Affect (typically observed): Appropriate Orientation: : Oriented to Self, Oriented to Place, Oriented to  Time, Oriented to Situation Alcohol / Substance Use: Not Applicable Psych Involvement: No (comment)  Admission diagnosis:  COPD exacerbation (HCC) [J44.1] COPD with acute exacerbation (HCC) [J44.1] Upper respiratory tract infection, unspecified type [J06.9] Patient Active Problem List   Diagnosis Date Noted   COPD with acute exacerbation (HCC) 12/24/2023   Syncope 07/13/2023   Syncope and collapse 07/13/2023   RLS (restless legs syndrome) 06/12/2022   Malignant neoplasm metastatic to bone (HCC) 02/08/2021   Angiodysplasia of intestine 08/23/2020   Port-A-Cath in place 08/04/2020   Counseling regarding advance care planning and goals of care 02/07/2020   Multiple myeloma (HCC) 01/25/2020   Dizziness 10/12/2019   Post-nasal drainage 10/12/2019   Allergic rhinitis 08/19/2018   Type 2 diabetes mellitus with diabetic neuropathy, unspecified (HCC) 11/05/2017   Encounter for long-term use of muscle relaxants 09/24/2017   CAD in native artery 09/24/2017   OSA (obstructive sleep apnea) 09/24/2017   Snorings 09/24/2017   Asthenia 06/30/2017   Morbid obesity (HCC) 06/02/2017   Depression 06/02/2017  Iron  deficiency anemia 09/23/2016   Anemia of chronic disease 09/28/2015   Genetic testing 08/21/2015   History of left breast cancer 07/13/2015   History of right breast cancer 06/20/2015   Hearing loss due to cerumen impaction  12/29/2014   Allergy to adhesive tape 05/19/2014   Hyperlipidemia 12/06/2013   GERD (gastroesophageal reflux disease) 12/06/2013   Cervical disc disease 11/11/2013   Osteopenia 05/25/2013   Allergic asthma 12/24/2012   Insomnia 04/02/2012   Physical exam, annual 04/02/2012   HTN (hypertension) 02/03/2012   Vertigo, benign positional 02/03/2012   Left groin pain 02/03/2012   Hip pain 10/10/2011   TIA (transient ischemic attack) 07/24/2011   Allergic reaction 07/05/2011   Osteoarthrosis involving lower leg 10/19/2010   Disorder of bone and cartilage 05/01/2010   Generalized anxiety disorder 05/01/2010   Vitamin D  deficiency 02/20/2010   Unspecified chronic bronchitis (HCC) 08/24/2009   Other lymphedema 10/29/2007   PCP:  Jess Morita, MD Pharmacy:   [facility] [phone] Jonette Nestle, [place] - [address] AT [facility] [address] [place] [address] Phone: [phone] Fax: [phone]  [facility] - [place], [place] - [address] [place] [address] Phone: [phone] Fax: [phone]  [facility] [address] [place] [address] Phone: [phone] Fax: [phone]     Social Drivers of Health (SDOH) Social History: SDOH Screenings   Food Insecurity: Food Insecurity Present (12/24/2023)  Housing: Low Risk  (12/24/2023)  Transportation Needs: No Transportation Needs (12/24/2023)  Utilities: Not At Risk (12/24/2023)  Alcohol Screen: Low Risk  (12/20/2021)  Depression (PHQ2-9): High Risk (07/22/2023)  Financial Resource Strain: High Risk (12/26/2022)  Physical Activity: Inactive (12/26/2022)  Social Connections: Moderately Isolated (12/24/2023)  Stress: Stress Concern Present (12/26/2022)  Tobacco Use: Medium Risk (12/24/2023)   SDOH Interventions: Food Insecurity Interventions: Intervention Not Indicated, Inpatient TOC   Readmission  Risk Interventions     No data to display

## 2023-12-24 NOTE — ED Provider Notes (Signed)
 [place] EMERGENCY DEPARTMENT AT [facility] Provider Note   CSN: [id number] Arrival date & time: 12/24/23  4098     History  Chief Complaint  Patient presents with   Cough    Tracy [name] is a 78 y.o. female.  Patient with history of CAD, chronic bronchitis, hypertension, hyperlipidemia, MI, breast cancer in remission, multiple myeloma on q28 day immunotherapy infusions, CVA presents today with complaints of cough, congestion. She states that same has been ongoing x 3 days. She has tried OTC Mucinex  as well as some old antibiotics that her pcp prescribed her some time ago. She is not sure what these antibiotics were. She denies history of similar symptoms previously. She states she feels weak and out of breath. She was unable to sleep last night due to a persistent cough. Notes she felt like she was wheezing last night as well. She has never had an inhaler. Remote history of smoking, quit many years ago. Denies leg pain or leg swelling. No recent travel or surgeries. Endorses tightness in her chest but no significant pain. No known sick contacts. Of note, patient lives alone, ambulates with a cane at baseline.   The history is provided by the patient. No language interpreter was used.  Cough Associated symptoms: wheezing        Home Medications Prior to Admission medications   Medication Sig Start Date End Date Taking? Authorizing Provider  acyclovir  (ZOVIRAX ) 400 MG tablet Take 1 tablet (400 mg total) by mouth 2 (two) times daily. 12/15/23   Frankie Israel, MD  atorvastatin  (LIPITOR) 10 MG tablet TAKE 1 TABLET BY MOUTH DAILY 10/10/23   [name], Katherine E, MD  dicyclomine  (BENTYL ) 10 MG capsule Take 1 capsule (10 mg total) by mouth every 6 (six) hours. 10/03/23   Elois Hair, MD  gabapentin  (NEURONTIN ) 300 MG capsule Take 2 capsules (600 mg total) by mouth 3 (three) times daily. 01/31/23 04/25/24  [name], Katherine E, MD  glucose blood (ACCU-CHEK GUIDE) test  strip Use as instructed to check sugars 1-2 times daily. 02/10/20   [name], Katherine E, MD  HYDROcodone -acetaminophen  (NORCO/VICODIN) 5-325 MG tablet Take 1 tablet by mouth every 6 (six) hours as needed for moderate pain (pain score 4-6). 10/03/23 10/02/24  [name], Katherine E, MD  levocetirizine (XYZAL ) 5 MG tablet Take 5 mg by mouth daily. 11/01/23   [provider]  lidocaine  (LIDODERM ) 5 % Place 1 patch onto the skin daily. Remove & Discard patch within 12 hours or as directed by MD Patient taking differently: Place 1 patch onto the skin daily as needed (for pain). 07/03/23   Frankie Israel, MD  lidocaine -prilocaine  (EMLA ) cream APPLY TOPICALLY TO PORT SITE 1  HOUR PRIOR TO ACCESS AS NEEDED 12/08/23   Frankie Israel, MD  meclizine  (ANTIVERT ) 25 MG tablet Take 1 tablet (25 mg total) by mouth 3 (three) times daily as needed for dizziness. 03/08/21   Percival Brace, NP  metFORMIN  (GLUCOPHAGE ) 500 MG tablet TAKE 1 TABLET BY MOUTH TWICE  DAILY WITH MEALS 12/23/23   [name], Katherine E, MD  ondansetron  (ZOFRAN ) 8 MG tablet Take 1 tablet (8 mg total) by mouth every 8 (eight) hours as needed for nausea or vomiting. 01/31/23   Thayil, Irene T, PA-C  pantoprazole  (PROTONIX ) 40 MG tablet TAKE 1 TABLET BY MOUTH TWICE  DAILY 07/07/23   [name], Katherine E, MD  sucralfate  (CARAFATE ) 1 g tablet Take 1 tablet (1 g total) by mouth 4 (four)  times daily -  with meals and at bedtime. 07/22/23   [name], Katherine E, MD  tiZANidine  (ZANAFLEX ) 4 MG tablet TAKE 1 TABLET BY MOUTH AT  BEDTIME 04/10/22   [name], Katherine E, MD  traZODone  (DESYREL ) 100 MG tablet TAKE 1 TABLET BY MOUTH AT  BEDTIME 04/01/23   [name], Katherine E, MD  Vitamin D , Ergocalciferol , (DRISDOL ) 1.25 MG (50000 UNIT) CAPS capsule TAKE 1 CAPSULE BY MOUTH ONCE  WEEKLY 08/18/23   Kale, Gautam Kishore, MD      Allergies    Bacitracin-neomycin-polymyxin  [neomycin-bacitracin zn-polymyx], Latex, Nsaids, Prednisone , Tape, Ambien [zolpidem  tartrate], Amoxicillin, Clavulanic acid, Contrast media [iodinated contrast media], and Tessalon  [benzonatate ]    Review of Systems   Review of Systems  Respiratory:  Positive for cough and wheezing.   All other systems reviewed and are negative.   Physical Exam Updated Vital Signs BP (!) 186/86   Pulse 94   Temp 100.2 F (37.9 C) (Oral)   Resp 19   SpO2 97%  Physical Exam Vitals and nursing note reviewed.  Constitutional:      General: She is not in acute distress.    Appearance: Normal appearance. She is normal weight. She is not ill-appearing, toxic-appearing or diaphoretic.  HENT:     Head: Normocephalic and atraumatic.  Cardiovascular:     Rate and Rhythm: Normal rate and regular rhythm.     Heart sounds: Normal heart sounds.  Pulmonary:     Effort: Pulmonary effort is normal. No respiratory distress.     Breath sounds: Wheezing present.  Chest:     Comments: Port right anterior chest Abdominal:     General: Abdomen is flat.     Palpations: Abdomen is soft.     Tenderness: There is no abdominal tenderness.  Musculoskeletal:        General: Normal range of motion.     Cervical back: Normal range of motion.     Right lower leg: No edema.     Left lower leg: No edema.  Skin:    General: Skin is warm and dry.  Neurological:     General: No focal deficit present.     Mental Status: She is alert.  Psychiatric:        Mood and Affect: Mood normal.        Behavior: Behavior normal.     ED Results / Procedures / Treatments   Labs (all labs ordered are listed, but only abnormal results are displayed) Labs Reviewed  CBC WITH DIFFERENTIAL/PLATELET - Abnormal; Notable for the following components:      Result Value   RBC 3.19 (*)    Hemoglobin 9.8 (*)    HCT 29.8 (*)    All other components within normal limits  BASIC METABOLIC PANEL WITH GFR - Abnormal; Notable for the following components:   Glucose, Bld 121 (*)    Creatinine, Ser 1.13 (*)    Calcium  8.6 (*)     GFR, Estimated 49 (*)    All other components within normal limits  RESP PANEL BY RT-PCR (RSV, FLU A&B, COVID)  [id number]    EKG EKG Interpretation Date/Time:  Wednesday Dec 24 2023 07:36:44 EDT Ventricular Rate:  89 PR Interval:  128 QRS Duration:  84 QT Interval:  405 QTC Calculation: 493 R Axis:   -4  Text Interpretation: Sinus rhythm Borderline T wave abnormalities Borderline prolonged QT interval Confirmed by Mozell Arias [phone]) on 12/24/2023 9:26:56 AM  Radiology DG Chest 2 View Result  Date: 12/24/2023 CLINICAL DATA:  78 year old female with cough and fever for 3 days. History of multiple myeloma. EXAM: CHEST - 2 VIEW COMPARISON:  CTA chest 07/13/2023 and earlier. FINDINGS: Semi upright AP and lateral views of the chest 0644 hours. Chronic right chest power port appears stable. Centrilobular emphysema on the previous CT. Mediastinal contour stable and within normal limits. Stable chronic increased pulmonary interstitium with no pneumothorax, pulmonary edema, pleural effusion or confluent opacity. Paucity of bowel gas. Stable visualized osseous structures. IMPRESSION: 1. No acute cardiopulmonary abnormality. 2.  Emphysema (ICD10-J43.9). Right chest port. Electronically Signed   By: Marlise Simpers M.D.   On: 12/24/2023 06:57    Procedures Procedures    Medications Ordered in ED Medications  ipratropium-albuterol  (DUONEB) 0.5-2.5 (3) MG/3ML nebulizer solution 3 mL (has no administration in time range)  dexamethasone  (DECADRON ) injection 10 mg (has no administration in time range)    ED Course/ Medical Decision Making/ A&P                                 Medical Decision Making Amount and/or Complexity of Data Reviewed Labs: ordered. Radiology: ordered.  Risk OTC drugs. Prescription drug management.   This patient is a 78 y.o. female who presents to the ED for concern of cough, weakness, shortness of breath, this involves an extensive number of treatment options, and is  a complaint that carries with it a high risk of complications and morbidity. The emergent differential diagnosis prior to evaluation includes, but is not limited to,  CHF, pericardial effusion/tamponade, arrhythmias, ACS, COPD, asthma, bronchitis, pneumonia, pneumothorax, PE, anemia  This is not an exhaustive differential.   Past Medical History / Co-morbidities / Social History:  has a past medical history of Angiodysplasia of intestine (08/23/2020), Anxiety, Arthritis, Breast cancer (HCC) (06/13/2015), CAD (coronary artery disease), Cancer (HCC) (2000), Chronic bronchitis (HCC), Chronic bronchitis (HCC), Hyperlipidemia, Hypertension, Myocardial infarction (HCC) (2001), Personal history of radiation therapy, Restless leg, and Stroke (HCC) (2004).  Additional history: Chart reviewed. Pertinent results include: last immunotherapy infusion for multiple myeloma on 5/09.   Physical Exam: Physical exam performed. The pertinent findings include: diffuse wheezing throughout, persistent dry sounding cough. On room air  Lab Tests: I ordered, and personally interpreted labs.  The pertinent results include:  no leukocytosis, kidney function at baseline. RVP negative   Imaging Studies: I ordered imaging studies including CXR. I independently visualized and interpreted imaging which showed   1. No acute cardiopulmonary abnormality. 2.  Emphysema. Right chest port.  I agree with the radiologist interpretation.   Cardiac Monitoring:  The patient was maintained on a cardiac monitor.  My attending physician Dr. Val Garin viewed and interpreted the cardiac monitored which showed an underlying rhythm of: sinus rhythm, no STEMI. I agree with this interpretation.   Medications: I ordered medication including duonebs x 3, decadron   for wheezing, cough, shortness of breath. Reevaluation of the patient after these medicines showed that the patient improved. I have reviewed the patients home medicines and have  made adjustments as needed.  Disposition: After consideration of the diagnostic results and the patients response to treatment, I feel that patient will require admission, likely COPD exacerbation, potentially URI as well. Considered evaluation for PE given active malignancy, however symptoms much more consistent with above, patient also with contrast allergy. After above interventions, she does feel better and sounds some better as well, however with ambulation, O2 saturation drops to low  90s on room air and patient notes dyspnea as well as weakness. Does not feel she can manage symptoms and ADLs at home, especially given she lives alone. She is requesting admission which is reasonable.   Discussed patient with hospitalist Dr. Lowell Rude who accepts patient for admission.  I discussed this case with my attending physician Dr. Val Garin who cosigned this note including patient's presenting symptoms, physical exam, and planned diagnostics and interventions. Attending physician stated agreement with plan or made changes to plan which were implemented.    Final Clinical Impression(s) / ED Diagnoses Final diagnoses:  COPD exacerbation (HCC)  Upper respiratory tract infection, unspecified type    Rx / DC Orders ED Discharge Orders     None         Cami Delawder A, PA-C 12/24/23 1013    Mozell Arias, MD 12/24/23 1504

## 2023-12-24 NOTE — ED Notes (Addendum)
 Pt. Ambulated in hallway w/ cane. Pt. Unsteady and c/o dizziness. Pt. O2 92-96% RA. MD aware.

## 2023-12-24 NOTE — Progress Notes (Signed)
 OT Cancellation Note  Patient Details Name: Tracy [name] MRN: [id number] DOB: [birth year]   Cancelled Treatment:    Reason Eval/Treat Not Completed: Medical issues which prohibited therapy. Discussed with PT - pt lightheaded and with elevated BP. OT spoke to pt sitting EOB, who endorses still feeling lightheaded. OT assisted pt back to bed, will complete formal eval at later date (RN in room shortly before OT arrival).  Daysen Gundrum L. Sue Mcalexander, OTR/L  12/24/23, 3:54 PM

## 2023-12-24 NOTE — ED Notes (Signed)
Floor notified of transfer.

## 2023-12-24 NOTE — ED Triage Notes (Signed)
 Pt reports cough, congestion and body aches x 3 days.

## 2023-12-24 NOTE — H&P (Signed)
 History and Physical  Tracy [name] KGM:[id number] DOB: 1945-08-04 DOA: 12/24/2023  PCP: Jess Morita, MD   Chief Complaint: Cough, shortness of breath  HPI: Tracy [name] is a 78 y.o. female with medical history significant for chronic bronchitis, hypertension, hyperlipidemia, MI, breast cancer in remission, multiple myeloma on immunotherapy being admitted to the hospital with cough, dyspnea likely representing acute exacerbation of COPD.  She has had cough and congestion ongoing for the past 3 days, feels that she was wheezing last night as well.  She denies any fevers or chills.  Denies any chest pain.  She did smoke for many years, quit probably 5 or 6 years ago.  No known sick contacts, has not been eating very well for the last 24 hours, and feels generally weak.  Review of Systems: Please see HPI for pertinent positives and negatives. A complete 10 system review of systems are otherwise negative.  Past Medical History:  Diagnosis Date   Angiodysplasia of intestine 08/23/2020   Anxiety    Arthritis    Breast cancer (HCC) 06/13/2015   CAD (coronary artery disease)    Cancer (HCC) 2000   breast cancer   Chronic bronchitis (HCC)    Chronic bronchitis (HCC)    Hyperlipidemia    Hypertension    Myocardial infarction [facility]) 2001   Personal history of radiation therapy    Restless leg    Stroke (HCC) 2004   TIA, no deficits   Past Surgical History:  Procedure Laterality Date   ABDOMINAL HYSTERECTOMY  1985   BREAST LUMPECTOMY Left 2000   radiation and chemo   BREAST LUMPECTOMY Right 2016   radiation   BREAST SURGERY  2001   lt breast lumpectomy   COLONOSCOPY WITH ESOPHAGOGASTRODUODENOSCOPY (EGD)  04/2020   ENTEROSCOPY N/A 03/15/2021   Procedure: ENTEROSCOPY;  Surgeon: Janel Medford, MD;  Location: [facility];  Service: Endoscopy;  Laterality: N/A;   GIVENS CAPSULE STUDY  07/2020   HOT HEMOSTASIS N/A 03/15/2021   Procedure: HOT HEMOSTASIS (ARGON PLASMA  COAGULATION/BICAP);  Surgeon: Janel Medford, MD;  Location: [facility];  Service: Endoscopy;  Laterality: N/A;   IR IMAGING GUIDED PORT INSERTION  04/25/2020   RADIOACTIVE SEED GUIDED PARTIAL MASTECTOMY WITH AXILLARY SENTINEL LYMPH NODE BIOPSY Right 07/07/2015   Procedure: RIGHT RADIOACTIVE SEED GUIDED PARTIAL MASTECTOMY WITH AXILLARY SENTINEL LYMPH NODE BIOPSY;  Surgeon: Lillette Reid III, MD;  Location: [facility];  Service: General;  Laterality: Right;   SMALL INTESTINE SURGERY     TUBAL LIGATION     UPPER GASTROINTESTINAL ENDOSCOPY     Social History:  reports that she quit smoking about 12 years ago. Her smoking use included cigarettes. She started smoking about 32 years ago. She has a 20 pack-year smoking history. She has never used smokeless tobacco. She reports that she does not drink alcohol and does not use drugs.  Allergies  Allergen Reactions   Bacitracin-Neomycin-Polymyxin  [Neomycin-Bacitracin Zn-Polymyx] Swelling   Latex Swelling   Nsaids Other (See Comments)    nosebleeds   Prednisone  Swelling    Pt tolerates Dexamethasone . Throat swelling   Tape Hives    Pt cannot tolerate bandaids, tape, or any other adhesives.    Ambien [Zolpidem Tartrate] Other (See Comments)    Hives    Amoxicillin Other (See Comments)    Rash    Clavulanic Acid Hives   Contrast Media [Iodinated Contrast Media] Hives    Pt states hives with prior ct, was given benadryl   to resolve   Tessalon  [Benzonatate ] Itching and Rash    Family History  Problem Relation Age of Onset   Emphysema Mother 46       smoker   Diabetes Father    Lung cancer Sister        dx. <50; former smoker   Diabetes Brother    Diabetes Brother    Brain cancer Brother 72       unknown tumor type   Diabetes Paternal Aunt    Stroke Maternal Grandmother    Diabetes Paternal Grandmother    Cancer Daughter 45       neck cancer   Other Daughter        hysterectomy for unspecified reason   Colon cancer  Daughter    Breast cancer Cousin    Cancer Cousin        unspecified type   Breast cancer Other        triple negative breast cancer in her 2s   Colon polyps Neg Hx    Esophageal cancer Neg Hx    Gallbladder disease Neg Hx      Prior to Admission medications   Medication Sig Start Date End Date Taking? Authorizing Provider  acyclovir  (ZOVIRAX ) 400 MG tablet Take 1 tablet (400 mg total) by mouth 2 (two) times daily. 12/15/23   Frankie Israel, MD  atorvastatin  (LIPITOR) 10 MG tablet TAKE 1 TABLET BY MOUTH DAILY 10/10/23   [name], Katherine E, MD  dicyclomine  (BENTYL ) 10 MG capsule Take 1 capsule (10 mg total) by mouth every 6 (six) hours. 10/03/23   Elois Hair, MD  gabapentin  (NEURONTIN ) 300 MG capsule Take 2 capsules (600 mg total) by mouth 3 (three) times daily. 01/31/23 04/25/24  [name], Katherine E, MD  glucose blood (ACCU-CHEK GUIDE) test strip Use as instructed to check sugars 1-2 times daily. 02/10/20   [name], Katherine E, MD  HYDROcodone -acetaminophen  (NORCO/VICODIN) 5-325 MG tablet Take 1 tablet by mouth every 6 (six) hours as needed for moderate pain (pain score 4-6). 10/03/23 10/02/24  [name], Katherine E, MD  levocetirizine (XYZAL ) 5 MG tablet Take 5 mg by mouth daily. 11/01/23   [provider]  lidocaine  (LIDODERM ) 5 % Place 1 patch onto the skin daily. Remove & Discard patch within 12 hours or as directed by MD Patient taking differently: Place 1 patch onto the skin daily as needed (for pain). 07/03/23   Frankie Israel, MD  lidocaine -prilocaine  (EMLA ) cream APPLY TOPICALLY TO PORT SITE 1  HOUR PRIOR TO ACCESS AS NEEDED 12/08/23   Frankie Israel, MD  meclizine  (ANTIVERT ) 25 MG tablet Take 1 tablet (25 mg total) by mouth 3 (three) times daily as needed for dizziness. 03/08/21   Percival Brace, NP  metFORMIN  (GLUCOPHAGE ) 500 MG tablet TAKE 1 TABLET BY MOUTH TWICE  DAILY WITH MEALS 12/23/23   [name], Katherine E, MD  ondansetron  (ZOFRAN ) 8 MG tablet  Take 1 tablet (8 mg total) by mouth every 8 (eight) hours as needed for nausea or vomiting. 01/31/23   Thayil, Irene T, PA-C  pantoprazole  (PROTONIX ) 40 MG tablet TAKE 1 TABLET BY MOUTH TWICE  DAILY 07/07/23   [name], Katherine E, MD  sucralfate  (CARAFATE ) 1 g tablet Take 1 tablet (1 g total) by mouth 4 (four) times daily -  with meals and at bedtime. 07/22/23   [name], Katherine E, MD  tiZANidine  (ZANAFLEX ) 4 MG tablet TAKE 1 TABLET BY MOUTH AT  BEDTIME 04/10/22   [name], Katherine E,  MD  traZODone  (DESYREL ) 100 MG tablet TAKE 1 TABLET BY MOUTH AT  BEDTIME 04/01/23   [name], Katherine E, MD  Vitamin D , Ergocalciferol , (DRISDOL ) 1.25 MG (50000 UNIT) CAPS capsule TAKE 1 CAPSULE BY MOUTH ONCE  WEEKLY 08/18/23   Kale, Gautam Kishore, MD    Physical Exam: BP (!) 148/61   Pulse 89   Temp 98.3 F (36.8 C) (Oral)   Resp (!) 21   SpO2 93%  General:  Alert, oriented, calm, in no acute distress, speaking in full sentences, not currently coughing.  Her granddaughter is at the bedside.  She is resting comfortably on room air. Eyes: EOMI, clear conjuctivae, white sclerea Neck: supple, no masses, trachea mildline  Cardiovascular: RRR, no murmurs or rubs, no peripheral edema  Respiratory: clear to auscultation bilaterally, no active wheezing, but diffuse rhonchi Abdomen: soft, nontender, nondistended, normal bowel tones heard  Skin: dry, no rashes  Musculoskeletal: no joint effusions, normal range of motion  Psychiatric: appropriate affect, normal speech  Neurologic: extraocular muscles intact, clear speech, moving all extremities with intact sensorium         Labs on Admission:  Basic Metabolic Panel: Recent Labs  Lab 12/24/23 0703  NA 140  K 3.8  CL 105  CO2 23  GLUCOSE 121*  BUN 11  CREATININE 1.13*  CALCIUM  8.6*   Liver Function Tests: No results for input(s): "AST", "ALT", "ALKPHOS", "BILITOT", "PROT", "ALBUMIN" in the last 168 hours. No results for input(s): "LIPASE", "AMYLASE" in the last  168 hours. No results for input(s): "AMMONIA" in the last 168 hours. CBC: Recent Labs  Lab 12/24/23 0703  WBC 5.4  NEUTROABS 3.1  HGB 9.8*  HCT 29.8*  MCV 93.4  PLT 231   Cardiac Enzymes: No results for input(s): "CKTOTAL", "CKMB", "CKMBINDEX", "TROPONINI" in the last 168 hours. BNP (last 3 results) No results for input(s): "BNP" in the last 8760 hours.  ProBNP (last 3 results) No results for input(s): "PROBNP" in the last 8760 hours.  CBG: No results for input(s): "GLUCAP" in the last 168 hours.  Radiological Exams on Admission: DG Chest 2 View Result Date: 12/24/2023 CLINICAL DATA:  78 year old female with cough and fever for 3 days. History of multiple myeloma. EXAM: CHEST - 2 VIEW COMPARISON:  CTA chest 07/13/2023 and earlier. FINDINGS: Semi upright AP and lateral views of the chest 0644 hours. Chronic right chest power port appears stable. Centrilobular emphysema on the previous CT. Mediastinal contour stable and within normal limits. Stable chronic increased pulmonary interstitium with no pneumothorax, pulmonary edema, pleural effusion or confluent opacity. Paucity of bowel gas. Stable visualized osseous structures. IMPRESSION: 1. No acute cardiopulmonary abnormality. 2.  Emphysema (ICD10-J43.9). Right chest port. Electronically Signed   By: Marlise Simpers M.D.   On: 12/24/2023 06:57   Assessment/Plan Tracy [name] is a 78 y.o. female with medical history significant for chronic bronchitis, hypertension, hyperlipidemia, MI, breast cancer in remission, multiple myeloma on immunotherapy being admitted to the hospital with cough, dyspnea likely representing acute exacerbation of COPD.    Acute exacerbation of COPD-with diffuse wheezing, dyspnea with exertion and some shortness of breath.  Nonproductive cough.  Presumably has some underlying emphysema, given her long history of smoking. -Observation admission -Monitor vital signs, currently not requiring oxygen -Scheduled DuoNebs, as  needed albuterol  -P.o. Decadron  (has prednisone  allergy) -Daily Xyzal   Weakness-presumably due to poor p.o. intake, and COPD exacerbation as above.  Patient lives alone, will have PT/OT evaluate.  GERD-Protonix  and Carafate   Multiple myeloma-counts  appear stable, follows routinely with Dr. Salomon Cree.  No acute hematological issue.  Diabetes-patient technically meets criteria for diabetes, however tells me that she only has elevated blood sugars after her immunotherapy. -Regular diet for now, can switch to carb modified diet if blood sugars elevated significantly -Monitor blood sugars, with sliding scale as needed since she is receiving steroids  Hyperlipidemia-Lipitor  Neuropathy-gabapentin   DVT prophylaxis: Lovenox      Code Status: Full Code  Consults called: None  Admission status: Observation  Time spent: 48 minutes  Cabrini Ruggieri Rickey Charm MD [facility] Pager [phone]  If 7PM-7AM, please contact night-coverage [email or web address] Password [facility]  12/24/2023, 10:16 AM

## 2023-12-24 NOTE — Evaluation (Addendum)
 Physical Therapy Evaluation Patient Details Name: Tracy [name] MRN: [id number] DOB: Sep 15, 1945 Today's Date: 12/24/2023  History of Present Illness  78 y.o. female being admitted to the hospital with cough, dyspnea likely representing acute exacerbation of COPD. Pt with medical history significant for chronic bronchitis, hypertension, hyperlipidemia, MI, breast cancer in remission, multiple myeloma on immunotherapy  Clinical Impression  Pt admitted with above diagnosis. Pt ambulated 73' with RW , distance limited by lightheadedness. Assisted pt to seated position on edge of bed where BP was 183/80, HR 115, SpO2 97% on room air. RN notified of elevated BP. Pt reports she walks with a cane or rollator at baseline, she lives alone, she has a granddaughter who is available to assist as needed.  Pt currently with functional limitations due to the deficits listed below (see PT Problem List). Pt will benefit from acute skilled PT to increase their independence and safety with mobility to allow discharge.           If plan is discharge home, recommend the following: Assistance with cooking/housework;Assist for transportation;Help with stairs or ramp for entrance   Can travel by private vehicle        Equipment Recommendations None recommended by PT  Recommendations for Other Services       Functional Status Assessment Patient has had a recent decline in their functional status and demonstrates the ability to make significant improvements in function in a reasonable and predictable amount of time.     Precautions / Restrictions Precautions Precautions: Fall Recall of Precautions/Restrictions: Intact Precaution/Restrictions Comments: denies falls in past 6 months Restrictions Weight Bearing Restrictions Per Provider Order: No      Mobility  Bed Mobility Overal bed mobility: Modified Independent             General bed mobility comments: used rail, HOB up    Transfers Overall  transfer level: Needs assistance Equipment used: Rolling walker (2 wheels) Transfers: Sit to/from Stand Sit to Stand: Contact guard assist           General transfer comment: VCs hand placement    Ambulation/Gait Ambulation/Gait assistance: Contact guard assist Gait Distance (Feet): 35 Feet Assistive device: Rolling walker (2 wheels) Gait Pattern/deviations: Step-through pattern, Decreased stride length Gait velocity: decr     General Gait Details: distance limited by lightheadedness, assisted pt to sitting at EOB where BP was 183/80, RN notified  Stairs            Wheelchair Mobility     Tilt Bed    Modified Rankin (Stroke Patients Only)       Balance Overall balance assessment: Needs assistance Sitting-balance support: Feet supported, No upper extremity supported Sitting balance-Leahy Scale: Good     Standing balance support: Bilateral upper extremity supported, During functional activity, Reliant on assistive device for balance Standing balance-Leahy Scale: Fair                               Pertinent Vitals/Pain Pain Assessment Pain Assessment: No/denies pain    Home Living Family/patient expects to be discharged to:: Private residence Living Arrangements: Alone Available Help at Discharge: Friend(s);Family;Available PRN/intermittently Type of Home: Apartment Home Access: Stairs to enter Entrance Stairs-Rails: Can reach both;Left;Right Entrance Stairs-Number of Steps: 5   Home Layout: One level Home Equipment: Cane - single point;Shower seat;Rollator (4 wheels)      Prior Function Prior Level of Function : Independent/Modified Independent;Driving  Mobility Comments: Uses cane dvs rollator ADLs Comments: ind, some heavy groceries delivered     Extremity/Trunk Assessment   Upper Extremity Assessment Upper Extremity Assessment: Defer to OT evaluation    Lower Extremity Assessment Lower Extremity Assessment:  RLE deficits/detail;LLE deficits/detail RLE Deficits / Details: knee ext 4/5 RLE Sensation: decreased light touch;history of peripheral neuropathy RLE Coordination: WNL LLE Deficits / Details: knee ext +4/5 LLE Sensation: decreased light touch;history of peripheral neuropathy LLE Coordination: WNL    Cervical / Trunk Assessment Cervical / Trunk Assessment: Normal  Communication   Communication Communication: No apparent difficulties    Cognition Arousal: Alert Behavior During Therapy: WFL for tasks assessed/performed   PT - Cognitive impairments: No apparent impairments                         Following commands: Intact       Cueing       General Comments      Exercises     Assessment/Plan    PT Assessment Patient needs continued PT services  PT Problem List Impaired sensation;Decreased activity tolerance;Decreased balance;Decreased mobility       PT Treatment Interventions Gait training;Therapeutic exercise;Functional mobility training;DME instruction;Therapeutic activities;Balance training    PT Goals (Current goals can be found in the Care Plan section)  Acute Rehab PT Goals Patient Stated Goal: return to baseline PT Goal Formulation: With patient Time For Goal Achievement: 01/06/24 Potential to Achieve Goals: Good    Frequency Min 3X/week     Co-evaluation               AM-PAC PT "6 Clicks" Mobility  Outcome Measure Help needed turning from your back to your side while in a flat bed without using bedrails?: None Help needed moving from lying on your back to sitting on the side of a flat bed without using bedrails?: A Little Help needed moving to and from a bed to a chair (including a wheelchair)?: A Little Help needed standing up from a chair using your arms (e.g., wheelchair or bedside chair)?: A Little Help needed to walk in hospital room?: A Little Help needed climbing 3-5 steps with a railing? : A Little 6 Click Score: 19     End of Session Equipment Utilized During Treatment: Gait belt Activity Tolerance: Treatment limited secondary to medical complications (Comment) Patient left: in bed;with call bell/phone within reach;with nursing/sitter in room Nurse Communication: Mobility status PT Visit Diagnosis: Difficulty in walking, not elsewhere classified (R26.2);Unsteadiness on feet (R26.81)    Time: 1610-9604 PT Time Calculation (min) (ACUTE ONLY): 17 min   Charges:   PT Evaluation $PT Eval Moderate Complexity: 1 Mod   PT General Charges $$ ACUTE PT VISIT: 1 Visit         Daymon Evans PT 12/24/2023  Acute Rehabilitation Services  Office [phone]

## 2023-12-25 ENCOUNTER — Ambulatory Visit: Admitting: Gastroenterology

## 2023-12-25 DIAGNOSIS — E119 Type 2 diabetes mellitus without complications: Secondary | ICD-10-CM

## 2023-12-25 DIAGNOSIS — C9001 Multiple myeloma in remission: Secondary | ICD-10-CM | POA: Diagnosis not present

## 2023-12-25 DIAGNOSIS — E782 Mixed hyperlipidemia: Secondary | ICD-10-CM

## 2023-12-25 DIAGNOSIS — R531 Weakness: Secondary | ICD-10-CM

## 2023-12-25 DIAGNOSIS — J441 Chronic obstructive pulmonary disease with (acute) exacerbation: Secondary | ICD-10-CM | POA: Diagnosis not present

## 2023-12-25 LAB — BASIC METABOLIC PANEL WITH GFR
Anion gap: 12 (ref 5–15)
BUN: 14 mg/dL (ref 8–23)
CO2: 19 mmol/L — ABNORMAL LOW (ref 22–32)
Calcium: 8.6 mg/dL — ABNORMAL LOW (ref 8.9–10.3)
Chloride: 106 mmol/L (ref 98–111)
Creatinine, Ser: 1.2 mg/dL — ABNORMAL HIGH (ref 0.44–1.00)
GFR, Estimated: 46 mL/min — ABNORMAL LOW (ref >60)
Glucose, Bld: 173 mg/dL — ABNORMAL HIGH (ref 70–99)
Potassium: 3.7 mmol/L (ref 3.5–5.1)
Sodium: 137 mmol/L (ref 135–145)

## 2023-12-25 LAB — CBC
HCT: 27.8 % — ABNORMAL LOW (ref 36.0–46.0)
Hemoglobin: 8.9 g/dL — ABNORMAL LOW (ref 12.0–15.0)
MCH: 30.8 pg (ref 26.0–34.0)
MCHC: 32 g/dL (ref 30.0–36.0)
MCV: 96.2 fL (ref 80.0–100.0)
Platelets: 215 10*3/uL (ref 150–400)
RBC: 2.89 MIL/uL — ABNORMAL LOW (ref 3.87–5.11)
RDW: 13.6 % (ref 11.5–15.5)
WBC: 4 10*3/uL (ref 4.0–10.5)
nRBC: 0 % (ref 0.0–0.2)

## 2023-12-25 LAB — GLUCOSE, CAPILLARY
Glucose-Capillary: 132 mg/dL — ABNORMAL HIGH (ref 70–99)
Glucose-Capillary: 177 mg/dL — ABNORMAL HIGH (ref 70–99)
Glucose-Capillary: 288 mg/dL — ABNORMAL HIGH (ref 70–99)
Glucose-Capillary: 236 mg/dL — ABNORMAL HIGH (ref 70–99)

## 2023-12-25 MED ORDER — AZITHROMYCIN 250 MG PO TABS
500.0000 mg | ORAL_TABLET | Freq: Every day | ORAL | Status: AC
  Administered 2023-12-25 – 2023-12-27 (×3): 500 mg via ORAL
  Filled 2023-12-25 (×3): qty 2

## 2023-12-25 MED ORDER — GUAIFENESIN-DM 100-10 MG/5ML PO SYRP
5.0000 mL | ORAL_SOLUTION | ORAL | Status: DC | PRN
Start: 2023-12-25 — End: 2023-12-26
  Administered 2023-12-25 – 2023-12-26 (×3): 5 mL via ORAL
  Filled 2023-12-25 (×3): qty 5

## 2023-12-25 NOTE — Evaluation (Signed)
 Occupational Therapy Evaluation Patient Details Name: Tracy [name] MRN: [id number] DOB: 11-21-1945 Today's Date: 12/25/2023   History of Present Illness   78 y.o. female being admitted to the hospital with cough, dyspnea likely representing acute exacerbation of COPD. Pt with medical history significant for chronic bronchitis, hypertension, hyperlipidemia, MI, breast cancer in remission, multiple myeloma on immunotherapy     Clinical Impressions Patient evaluated by Occupational Therapy with no further acute OT needs identified. Patient adamant that unsteadiness earlier day due to breathing and med treatments. OT strongly recommending use of RW vs cane with patient in agreement. OT issued and trained in reacher use for improved functioanl reach and falls prevention. Daughter present for education as well. Patient is declining OTs rec for [facility] services despite identified benefit due to living alone. Within Acute setting, all education has been completed and the patient has no further questions.  See below for any follow-up Occupational Therapy or equipment needs. OT is signing off. Thank you for this referral.       If plan is discharge home, recommend the following:   A little help with walking and/or transfers;A little help with bathing/dressing/bathroom;Assist for transportation     Functional Status Assessment   Patient has had a recent decline in their functional status and demonstrates the ability to make significant improvements in function in a reasonable and predictable amount of time.     Equipment Recommendations   None recommended by OT (issued and trained in reacher use)      Precautions/Restrictions   Precautions Precautions: Fall Recall of Precautions/Restrictions: Intact Precaution/Restrictions Comments: denies falls in past 6 months Restrictions Weight Bearing Restrictions Per Provider Order: No     Mobility Bed Mobility Overal bed mobility: Modified  Independent                  Transfers Overall transfer level: Needs assistance Equipment used: Rolling walker (2 wheels) Transfers: Sit to/from Stand, Bed to chair/wheelchair/BSC Sit to Stand: Supervision     Step pivot transfers: Supervision     General transfer comment: RW for increased steadiness as patient has rollator at home as well as SPC      Balance Overall balance assessment: Needs assistance Sitting-balance support: Feet supported, No upper extremity supported Sitting balance-Leahy Scale: Good     Standing balance support: Bilateral upper extremity supported, During functional activity, Reliant on assistive device for balance Standing balance-Leahy Scale: Fair                             ADL either performed or assessed with clinical judgement   ADL Overall ADL's : Needs assistance/impaired Eating/Feeding: Independent   Grooming: Wash/dry hands;Wash/dry face;Oral care;Applying deodorant;Independent;Sitting   Upper Body Bathing: Independent;Sitting   Lower Body Bathing: Modified independent;Sit to/from stand   Upper Body Dressing : Independent;Sitting   Lower Body Dressing: Supervision/safety;Sit to/from stand Lower Body Dressing Details (indicate cue type and reason): increased effort for socks Toilet Transfer: Regular Toilet;Supervision/safety;Rolling walker (2 wheels)   Toileting- Clothing Manipulation and Hygiene: Modified independent;Sit to/from stand       Functional mobility during ADLs: Modified independent;Rolling walker (2 wheels) General ADL Comments: educated and trained in reacher use for LB self care     Vision Baseline Vision/History: 0 No visual deficits;1 Wears glasses Ability to See in Adequate Light: 0 Adequate Patient Visual Report: No change from baseline Vision Assessment?: No apparent visual deficits  Pertinent Vitals/Pain Pain Assessment Pain Assessment: No/denies pain     Extremity/Trunk  Assessment Upper Extremity Assessment Upper Extremity Assessment: Right hand dominant;Overall [facility] for tasks assessed   Lower Extremity Assessment Lower Extremity Assessment: Defer to PT evaluation   Cervical / Trunk Assessment Cervical / Trunk Assessment: Normal   Communication Communication Communication: No apparent difficulties   Cognition Arousal: Alert Behavior During Therapy: WFL for tasks assessed/performed Cognition: No apparent impairments                               Following commands: Intact       Cueing  General Comments   Cueing Techniques: Verbal cues  VSS           Home Living Family/patient expects to be discharged to:: Private residence Living Arrangements: Alone Available Help at Discharge: Friend(s);Family;Available PRN/intermittently Type of Home: Apartment Home Access: Stairs to enter Entrance Stairs-Number of Steps: 5 Entrance Stairs-Rails: Can reach both;Left;Right Home Layout: One level     Bathroom Shower/Tub: Tub/shower unit;Curtain   Bathroom Toilet: Handicapped height     Home Equipment: Cane - single point;Rollator (4 wheels);Shower seat   Additional Comments: Pt lives alone, grandson assists sometimes if needed      Prior Functioning/Environment Prior Level of Function : Independent/Modified Independent;Driving             Mobility Comments: Uses cane dvs rollator ADLs Comments: ind, some heavy groceries delivered     AM-PAC OT "6 Clicks" Daily Activity     Outcome Measure Help from another person eating meals?: None Help from another person taking care of personal grooming?: None Help from another person toileting, which includes using toliet, bedpan, or urinal?: None Help from another person bathing (including washing, rinsing, drying)?: A Little Help from another person to put on and taking off regular upper body clothing?: None Help from another person to put on and taking off regular lower body  clothing?: A Little 6 Click Score: 22   End of Session Equipment Utilized During Treatment: Gait belt;Rolling walker (2 wheels) Nurse Communication: Mobility status  Activity Tolerance: Patient tolerated treatment well Patient left: in chair;with chair alarm set;with family/visitor present  OT Visit Diagnosis: Unsteadiness on feet (R26.81)                Time: 4742-5956 OT Time Calculation (min): 22 min Charges:  OT General Charges $OT Visit: 1 Visit OT Evaluation $OT Eval Low Complexity: 1 Low  Marit Goodwill OT/L Acute Rehabilitation Department  [phone] ' 12/25/2023, 5:25 PM

## 2023-12-25 NOTE — Plan of Care (Signed)

## 2023-12-25 NOTE — Progress Notes (Signed)
 Physical Therapy Treatment Patient Details Name: Tracy [name] MRN: [id number] DOB: June 22, 1946 Today's Date: 12/25/2023   History of Present Illness 78 y.o. female being admitted to the hospital with cough, dyspnea likely representing acute exacerbation of COPD. Pt with medical history significant for chronic bronchitis, hypertension, hyperlipidemia, MI, breast cancer in remission, multiple myeloma on immunotherapy    PT Comments  Pt is progressing well with mobility, she tolerated increased ambulation distance of 120' with RW, no loss of balance. SpO2 98% on room air. Pt reports her granddaughter can assist with transportation and meals at home. HHPT recommended.     If plan is discharge home, recommend the following: Assistance with cooking/housework;Assist for transportation;Help with stairs or ramp for entrance   Can travel by private vehicle        Equipment Recommendations  None recommended by PT    Recommendations for Other Services       Precautions / Restrictions Precautions Precautions: Fall Recall of Precautions/Restrictions: Intact Precaution/Restrictions Comments: denies falls in past 6 months Restrictions Weight Bearing Restrictions Per Provider Order: No     Mobility  Bed Mobility               General bed mobility comments: sitting up at edge of bed    Transfers Overall transfer level: Needs assistance Equipment used: Rolling walker (2 wheels) Transfers: Sit to/from Stand Sit to Stand: Contact guard assist           General transfer comment: VCs hand placement    Ambulation/Gait Ambulation/Gait assistance: Supervision Gait Distance (Feet): 120 Feet Assistive device: Rolling walker (2 wheels) Gait Pattern/deviations: Step-through pattern, Decreased stride length Gait velocity: decr     General Gait Details: steady, no loss of balance, 2/4 dyspnea, SpO2 98% on room air   Stairs             Wheelchair Mobility     Tilt Bed     Modified Rankin (Stroke Patients Only)       Balance Overall balance assessment: Needs assistance Sitting-balance support: Feet supported, No upper extremity supported Sitting balance-Leahy Scale: Good     Standing balance support: Bilateral upper extremity supported, During functional activity, Reliant on assistive device for balance Standing balance-Leahy Scale: Fair                              Hotel manager: No apparent difficulties  Cognition Arousal: Alert Behavior During Therapy: WFL for tasks assessed/performed   PT - Cognitive impairments: No apparent impairments                         Following commands: Intact      Cueing    Exercises      General Comments        Pertinent Vitals/Pain Pain Assessment Pain Assessment: No/denies pain    Home Living                          Prior Function            PT Goals (current goals can now be found in the care plan section) Acute Rehab PT Goals Patient Stated Goal: return to baseline PT Goal Formulation: With patient Time For Goal Achievement: 01/06/24 Potential to Achieve Goals: Good Progress towards PT goals: Progressing toward goals    Frequency    Min 3X/week  PT Plan      Co-evaluation              AM-PAC PT "6 Clicks" Mobility   Outcome Measure  Help needed turning from your back to your side while in a flat bed without using bedrails?: None Help needed moving from lying on your back to sitting on the side of a flat bed without using bedrails?: A Little Help needed moving to and from a bed to a chair (including a wheelchair)?: A Little Help needed standing up from a chair using your arms (e.g., wheelchair or bedside chair)?: A Little Help needed to walk in hospital room?: A Little Help needed climbing 3-5 steps with a railing? : A Little 6 Click Score: 19    End of Session Equipment Utilized During Treatment:  Gait belt Activity Tolerance: Patient tolerated treatment well Patient left: with call bell/phone within reach;in chair Nurse Communication: Mobility status PT Visit Diagnosis: Difficulty in walking, not elsewhere classified (R26.2);Unsteadiness on feet (R26.81)     Time: 1914-7829 PT Time Calculation (min) (ACUTE ONLY): 12 min  Charges:    $Gait Training: 8-22 mins PT General Charges $$ ACUTE PT VISIT: 1 Visit                     Daymon Evans PT 12/25/2023  Acute Rehabilitation Services  Office [phone]

## 2023-12-25 NOTE — Progress Notes (Signed)
 Progress Note   Patient: Tracy [name]:[id number] DOB: 1946-01-02 DOA: 12/24/2023     0 DOS: the patient was seen and examined on 12/25/2023   Brief hospital course: Tracy [name] is a 78 y.o. female with medical history significant for chronic bronchitis, hypertension, hyperlipidemia, MI, breast cancer in remission, multiple myeloma on immunotherapy being admitted to the hospital with cough, dyspnea, acute exacerbation of COPD.   Assessment and Plan: Acute exacerbation of COPD-with diffuse wheezing, dyspnea with exertion and some shortness of breath.  Nonproductive cough.  Presumably has some underlying emphysema, given her long history of smoking. Started Azithro for 3 days. Continue scheduled DuoNebs, as needed albuterol  P.o. Decadron  with taper. Daily Xyzal    Generalized Weakness- presumably due to poor p.o. intake, and COPD exacerbation as above.  Patient lives alone, PT/ OT advised HH services.   GERD- Continue Protonix  and Carafate    Multiple myeloma- counts appear stable, follows routinely with Dr. Salomon Cree.  No acute hematological issue.   Diabetes-type 2- A1c 7.0. Continue to monitor blood sugars, with sliding scale as needed since she is receiving steroids   Hyperlipidemia- continue Lipitor   Neuropathy- Continue gabapentin .  Obesity Class II- BMI 36.42 Diet, exercise and weight reduction.     Out of bed to chair. Incentive spirometry. Nursing supportive care. Fall, aspiration precautions. Diet:  Diet Orders (From admission, onward)     Start     Ordered   12/24/23 1043  Diet regular Room service appropriate? Yes; Fluid consistency: Thin  Diet effective now       Question Answer Comment  Room service appropriate? Yes   Fluid consistency: Thin      12/24/23 1042           DVT prophylaxis: enoxaparin  (LOVENOX ) injection 40 mg Start: 12/24/23 1400  Level of care: Med-Surg   Code Status: Full Code  Subjective: Patient is seen and examined  today morning. She is feeling short of breath with exertion. RT at bedside giving her nebs. Encouraged her to work with PT.  Physical Exam: Vitals:   12/24/23 2025 12/25/23 0501 12/25/23 0845 12/25/23 1153  BP: (!) 128/58 (!) 112/51  (!) 126/58  Pulse: 88 75  91  Resp: 20 20  18   Temp: 98.3 F (36.8 C) 98.4 F (36.9 C)  98.1 F (36.7 C)  TempSrc:      SpO2: 97% 98% 96% 99%  Weight:      Height:        General - Elderly African American female, mild respiratory distress HEENT - PERRLA, EOMI, atraumatic head, non tender sinuses. Lung - Clear, diffuse rales, basal wheezes. Heart - S1, S2 heard, no murmurs, rubs, trace pedal edema. Abdomen - Soft, non tender, bowel sounds good Neuro - Alert, awake and oriented x 3, non focal exam. Skin - Warm and dry.  Data Reviewed:      Latest Ref Rng & Units 12/25/2023    4:32 AM 12/24/2023    7:03 AM 12/05/2023   12:06 PM  CBC  WBC 4.0 - 10.5 K/uL 4.0  5.4  5.4   Hemoglobin 12.0 - 15.0 g/dL 8.9  9.8  81.1   Hematocrit 36.0 - 46.0 % 27.8  29.8  29.4   Platelets 150 - 400 K/uL 215  231  346       Latest Ref Rng & Units 12/25/2023    4:32 AM 12/24/2023    7:03 AM 12/05/2023   12:06 PM  BMP  Glucose 70 -  99 mg/dL 295  621  308   BUN 8 - 23 mg/dL 14  11  10    Creatinine 0.44 - 1.00 mg/dL 6.57  8.46  9.62   Sodium 135 - 145 mmol/L 137  140  144   Potassium 3.5 - 5.1 mmol/L 3.7  3.8  4.0   Chloride 98 - 111 mmol/L 106  105  108   CO2 22 - 32 mmol/L 19  23  27    Calcium  8.9 - 10.3 mg/dL 8.6  8.6  9.1    DG Chest 2 View Result Date: 12/24/2023 CLINICAL DATA:  78 year old female with cough and fever for 3 days. History of multiple myeloma. EXAM: CHEST - 2 VIEW COMPARISON:  CTA chest 07/13/2023 and earlier. FINDINGS: Semi upright AP and lateral views of the chest 0644 hours. Chronic right chest power port appears stable. Centrilobular emphysema on the previous CT. Mediastinal contour stable and within normal limits. Stable chronic increased  pulmonary interstitium with no pneumothorax, pulmonary edema, pleural effusion or confluent opacity. Paucity of bowel gas. Stable visualized osseous structures. IMPRESSION: 1. No acute cardiopulmonary abnormality. 2.  Emphysema (ICD10-J43.9). Right chest port. Electronically Signed   By: Marlise Simpers M.D.   On: 12/24/2023 06:57    Family Communication: Discussed with patient, she understand and agree. All questions answered.  Disposition: Status is: Observation The patient remains OBS appropriate and will d/c before 2 midnights.  Planned Discharge Destination: Home with Home Health     Time spent: 35 minutes  Author: Aisha Hove, MD 12/25/2023 4:11 PM Secure chat 7am to 7pm For on call review [email or web address].

## 2023-12-25 NOTE — Inpatient Diabetes Management (Signed)
 Inpatient Diabetes Program Recommendations  AACE/ADA: New Consensus Statement on Inpatient Glycemic Control (2015)  Target Ranges:  Prepandial:   less than 140 mg/dL      Peak postprandial:   less than 180 mg/dL (1-2 hours)      Critically ill patients:  140 - 180 mg/dL   Lab Results  Component Value Date   GLUCAP 288 (H) 12/25/2023   HGBA1C 7.0 (H) 07/22/2023    Review of Glycemic Control  Diabetes history: DM2 Outpatient Diabetes medications: metformin  500 mg BID Current orders for Inpatient glycemic control: Novolog  0-15 TID with meals and 0-5 HS On Decadron  6 mg daily Eating 100% meals  Inpatient Diabetes Program Recommendations:    Consider adding Novolog  2-3 units TID for meal coverage if eating > 50% while on steroids.   Will follow glucose trends.  Thank you. Joni Net, RD, LDN, CDCES Inpatient Diabetes Coordinator [phone]

## 2023-12-26 ENCOUNTER — Ambulatory Visit: Admitting: Family Medicine

## 2023-12-26 DIAGNOSIS — C9000 Multiple myeloma not having achieved remission: Secondary | ICD-10-CM | POA: Diagnosis present

## 2023-12-26 DIAGNOSIS — K219 Gastro-esophageal reflux disease without esophagitis: Secondary | ICD-10-CM | POA: Diagnosis present

## 2023-12-26 DIAGNOSIS — G2581 Restless legs syndrome: Secondary | ICD-10-CM | POA: Diagnosis present

## 2023-12-26 DIAGNOSIS — I509 Heart failure, unspecified: Secondary | ICD-10-CM | POA: Diagnosis not present

## 2023-12-26 DIAGNOSIS — I10 Essential (primary) hypertension: Secondary | ICD-10-CM | POA: Diagnosis present

## 2023-12-26 DIAGNOSIS — J440 Chronic obstructive pulmonary disease with acute lower respiratory infection: Secondary | ICD-10-CM | POA: Diagnosis present

## 2023-12-26 DIAGNOSIS — J208 Acute bronchitis due to other specified organisms: Secondary | ICD-10-CM | POA: Diagnosis not present

## 2023-12-26 DIAGNOSIS — E66812 Obesity, class 2: Secondary | ICD-10-CM | POA: Diagnosis present

## 2023-12-26 DIAGNOSIS — E119 Type 2 diabetes mellitus without complications: Secondary | ICD-10-CM | POA: Diagnosis not present

## 2023-12-26 DIAGNOSIS — E782 Mixed hyperlipidemia: Secondary | ICD-10-CM | POA: Diagnosis not present

## 2023-12-26 DIAGNOSIS — E1140 Type 2 diabetes mellitus with diabetic neuropathy, unspecified: Secondary | ICD-10-CM | POA: Diagnosis present

## 2023-12-26 DIAGNOSIS — C9001 Multiple myeloma in remission: Secondary | ICD-10-CM | POA: Diagnosis not present

## 2023-12-26 DIAGNOSIS — J432 Centrilobular emphysema: Secondary | ICD-10-CM | POA: Diagnosis not present

## 2023-12-26 DIAGNOSIS — I700 Atherosclerosis of aorta: Secondary | ICD-10-CM | POA: Diagnosis not present

## 2023-12-26 DIAGNOSIS — I2510 Atherosclerotic heart disease of native coronary artery without angina pectoris: Secondary | ICD-10-CM | POA: Diagnosis present

## 2023-12-26 DIAGNOSIS — J441 Chronic obstructive pulmonary disease with (acute) exacerbation: Secondary | ICD-10-CM | POA: Diagnosis not present

## 2023-12-26 DIAGNOSIS — E1165 Type 2 diabetes mellitus with hyperglycemia: Secondary | ICD-10-CM | POA: Diagnosis present

## 2023-12-26 DIAGNOSIS — T380X5A Adverse effect of glucocorticoids and synthetic analogues, initial encounter: Secondary | ICD-10-CM | POA: Diagnosis present

## 2023-12-26 DIAGNOSIS — Z7984 Long term (current) use of oral hypoglycemic drugs: Secondary | ICD-10-CM | POA: Diagnosis not present

## 2023-12-26 DIAGNOSIS — R0789 Other chest pain: Secondary | ICD-10-CM | POA: Diagnosis not present

## 2023-12-26 DIAGNOSIS — B9781 Human metapneumovirus as the cause of diseases classified elsewhere: Secondary | ICD-10-CM

## 2023-12-26 DIAGNOSIS — J123 Human metapneumovirus pneumonia: Secondary | ICD-10-CM | POA: Diagnosis present

## 2023-12-26 DIAGNOSIS — R051 Acute cough: Secondary | ICD-10-CM | POA: Diagnosis not present

## 2023-12-26 DIAGNOSIS — J439 Emphysema, unspecified: Secondary | ICD-10-CM | POA: Diagnosis present

## 2023-12-26 DIAGNOSIS — E785 Hyperlipidemia, unspecified: Secondary | ICD-10-CM | POA: Diagnosis present

## 2023-12-26 DIAGNOSIS — I252 Old myocardial infarction: Secondary | ICD-10-CM | POA: Diagnosis not present

## 2023-12-26 DIAGNOSIS — N1831 Chronic kidney disease, stage 3a: Secondary | ICD-10-CM | POA: Diagnosis present

## 2023-12-26 DIAGNOSIS — R531 Weakness: Secondary | ICD-10-CM | POA: Diagnosis not present

## 2023-12-26 DIAGNOSIS — E1122 Type 2 diabetes mellitus with diabetic chronic kidney disease: Secondary | ICD-10-CM | POA: Diagnosis present

## 2023-12-26 DIAGNOSIS — Z87891 Personal history of nicotine dependence: Secondary | ICD-10-CM | POA: Diagnosis not present

## 2023-12-26 DIAGNOSIS — Z6836 Body mass index (BMI) 36.0-36.9, adult: Secondary | ICD-10-CM | POA: Diagnosis not present

## 2023-12-26 DIAGNOSIS — R0601 Orthopnea: Secondary | ICD-10-CM

## 2023-12-26 DIAGNOSIS — Z1152 Encounter for screening for COVID-19: Secondary | ICD-10-CM | POA: Diagnosis not present

## 2023-12-26 LAB — RESPIRATORY PANEL BY PCR

## 2023-12-26 LAB — GLUCOSE, CAPILLARY
Glucose-Capillary: 115 mg/dL — ABNORMAL HIGH (ref 70–99)
Glucose-Capillary: 134 mg/dL — ABNORMAL HIGH (ref 70–99)
Glucose-Capillary: 219 mg/dL — ABNORMAL HIGH (ref 70–99)
Glucose-Capillary: 266 mg/dL — ABNORMAL HIGH (ref 70–99)

## 2023-12-26 LAB — TROPONIN I (HIGH SENSITIVITY): Troponin I (High Sensitivity): 6 ng/L (ref <18)

## 2023-12-26 MED ORDER — BENZONATATE 100 MG PO CAPS
200.0000 mg | ORAL_CAPSULE | Freq: Three times a day (TID) | ORAL | Status: DC | PRN
  Administered 2023-12-26 – 2023-12-28 (×6): 200 mg via ORAL
  Filled 2023-12-26 (×6): qty 2

## 2023-12-26 MED ORDER — GUAIFENESIN ER 600 MG PO TB12
600.0000 mg | ORAL_TABLET | Freq: Two times a day (BID) | ORAL | Status: DC
  Administered 2023-12-26 – 2023-12-29 (×7): 600 mg via ORAL
  Filled 2023-12-26 (×7): qty 1

## 2023-12-26 NOTE — Plan of Care (Signed)

## 2023-12-26 NOTE — Progress Notes (Addendum)
 Progress Note   Patient: Tracy [name] NFA:[id number] DOB: 1945-08-09 DOA: 12/24/2023     0 DOS: the patient was seen and examined on 12/26/2023   Brief hospital course: ANTINETTE KEOUGH is a 78 y.o. female with medical history significant for chronic bronchitis, hypertension, hyperlipidemia, MI, breast cancer in remission, multiple myeloma on immunotherapy being admitted to the hospital with cough, dyspnea, acute exacerbation of COPD.   Assessment and Plan: Acute exacerbation of COPD-with diffuse wheezing, dyspnea with exertion and some shortness of breath.  Nonproductive cough.  Chest xray shows emphysema. She has chest tightness, orthopnea. Will check echocardiogram.  RVP panel positive for metapneumovirus. Continue azithro for 3 days. Continue scheduled DuoNebs, as needed albuterol  P.o. Decadron  with taper. Daily Xyzal .  Mucinex , tessalon  perles ordered.  Generalized Weakness- presumably due to poor p.o. intake, and COPD exacerbation as above.  Patient lives alone, PT/ OT advised HH services.   GERD- Continue Protonix  and Carafate    Multiple myeloma- counts appear stable, follows routinely with Dr. Salomon Cree.  No acute hematological issue.   Diabetes-type 2- A1c 7.0. Continue to monitor blood sugars, with sliding scale as needed since she is receiving steroids   Hyperlipidemia- continue Lipitor   Neuropathy- Continue gabapentin .  Obesity Class II- BMI 36.42 Diet, exercise and weight reduction. Advised sleep study outpatient to rule out sleep apnea.    Out of bed to chair. Incentive spirometry. Nursing supportive care. Fall, aspiration precautions. Diet:  Diet Orders (From admission, onward)     Start     Ordered   12/24/23 1043  Diet regular Room service appropriate? Yes; Fluid consistency: Thin  Diet effective now       Question Answer Comment  Room service appropriate? Yes   Fluid consistency: Thin      12/24/23 1042           DVT prophylaxis: enoxaparin   (LOVENOX ) injection 40 mg Start: 12/24/23 1400  Level of care: Med-Surg   Code Status: Full Code  Subjective: Patient is seen and examined today morning. She is feeling short of breath with exertion, has chest tightness, orthopnea, did not sleep last night, family at bedside. Cough bothering her.  Physical Exam: Vitals:   12/25/23 2046 12/26/23 0419 12/26/23 1343 12/26/23 1359  BP:  (!) 141/75  (!) 162/70  Pulse:  73  87  Resp:  17  17  Temp:  98 F (36.7 C)  98.8 F (37.1 C)  TempSrc:  Oral    SpO2: 95% 99% 94% 98%  Weight:      Height:        General - Elderly African American female, mild respiratory distress HEENT - PERRLA, EOMI, atraumatic head, non tender sinuses. Lung - Clear, diffuse rales, basal wheezes. Heart - S1, S2 heard, no murmurs, rubs, trace pedal edema. Abdomen - Soft, non tender, bowel sounds good Neuro - Alert, awake and oriented x 3, non focal exam. Skin - Warm and dry.  Data Reviewed:      Latest Ref Rng & Units 12/25/2023    4:32 AM 12/24/2023    7:03 AM 12/05/2023   12:06 PM  CBC  WBC 4.0 - 10.5 K/uL 4.0  5.4  5.4   Hemoglobin 12.0 - 15.0 g/dL 8.9  9.8  46.9   Hematocrit 36.0 - 46.0 % 27.8  29.8  29.4   Platelets 150 - 400 K/uL 215  231  346       Latest Ref Rng & Units 12/25/2023    4:32  AM 12/24/2023    7:03 AM 12/05/2023   12:06 PM  BMP  Glucose 70 - 99 mg/dL 409  811  914   BUN 8 - 23 mg/dL 14  11  10    Creatinine 0.44 - 1.00 mg/dL 7.82  9.56  2.13   Sodium 135 - 145 mmol/L 137  140  144   Potassium 3.5 - 5.1 mmol/L 3.7  3.8  4.0   Chloride 98 - 111 mmol/L 106  105  108   CO2 22 - 32 mmol/L 19  23  27    Calcium  8.9 - 10.3 mg/dL 8.6  8.6  9.1    No results found.   Family Communication: Discussed with patient, she understand and agree. All questions answered.  Disposition: Status is: changed to inpatient Inpatient because of her persistent symptoms, cardiac work up.  Planned Discharge Destination: Home with Home  Health     Time spent: 39 minutes  Author: Aisha Hove, MD 12/26/2023 4:57 PM Secure chat 7am to 7pm For on call review [email or web address].

## 2023-12-26 NOTE — TOC Progression Note (Addendum)
 Transition of Care [facility]) - Progression Note    Patient Details  Name: Tracy [name] MRN: [id number] Date of Birth: 1946-06-25  [facility] CM/SW Contact  Kathryn Parish, RN Phone Number: 12/26/2023, 8:17 AM  Clinical Narrative:     CM spoke with patient about recommendations for [facility] PT/OT. Patient states that she does not really needs HH PT/OT and has her granddaughter to assist her. TOC signing off. Please enter consult if needs present.  Expected Discharge Plan: Home/Self Care Barriers to Discharge: Continued Medical Work up  Expected Discharge Plan and Services In-house Referral: NA Discharge Planning Services: NA Post Acute Care Choice: NA Living arrangements for the past 2 months: Apartment                 DME Arranged: N/A DME Agency: NA       HH Arranged: NA HH Agency: NA         Social Determinants of Health (SDOH) Interventions SDOH Screenings   Food Insecurity: Food Insecurity Present (12/24/2023)  Housing: Low Risk  (12/24/2023)  Transportation Needs: No Transportation Needs (12/24/2023)  Utilities: Not At Risk (12/24/2023)  Alcohol Screen: Low Risk  (12/20/2021)  Depression (PHQ2-9): High Risk (07/22/2023)  Financial Resource Strain: High Risk (12/26/2022)  Physical Activity: Inactive (12/26/2022)  Social Connections: Moderately Isolated (12/24/2023)  Stress: Stress Concern Present (12/26/2022)  Tobacco Use: Medium Risk (12/24/2023)    Readmission Risk Interventions     No data to display

## 2023-12-27 ENCOUNTER — Inpatient Hospital Stay (HOSPITAL_COMMUNITY)

## 2023-12-27 DIAGNOSIS — C9001 Multiple myeloma in remission: Secondary | ICD-10-CM | POA: Diagnosis not present

## 2023-12-27 DIAGNOSIS — J441 Chronic obstructive pulmonary disease with (acute) exacerbation: Secondary | ICD-10-CM | POA: Diagnosis not present

## 2023-12-27 DIAGNOSIS — I509 Heart failure, unspecified: Secondary | ICD-10-CM | POA: Diagnosis not present

## 2023-12-27 DIAGNOSIS — R531 Weakness: Secondary | ICD-10-CM | POA: Diagnosis not present

## 2023-12-27 DIAGNOSIS — J208 Acute bronchitis due to other specified organisms: Secondary | ICD-10-CM | POA: Diagnosis not present

## 2023-12-27 LAB — ECHOCARDIOGRAM COMPLETE
AR max vel: 2.36 cm2
AV Area VTI: 2.4 cm2
AV Area mean vel: 2.85 cm2
AV Mean grad: 4.2 mmHg
AV Peak grad: 11.5 mmHg
Ao pk vel: 1.7 m/s
Area-P 1/2: 3.27 cm2
Calc EF: 76.5 %
Height: 60 in
MV VTI: 1.51 cm2
S' Lateral: 2.7 cm
Single Plane A2C EF: 84.4 %
Single Plane A4C EF: 65.2 %
Weight: 2984 [oz_av]

## 2023-12-27 LAB — GLUCOSE, CAPILLARY
Glucose-Capillary: 125 mg/dL — ABNORMAL HIGH (ref 70–99)
Glucose-Capillary: 148 mg/dL — ABNORMAL HIGH (ref 70–99)
Glucose-Capillary: 313 mg/dL — ABNORMAL HIGH (ref 70–99)
Glucose-Capillary: 289 mg/dL — ABNORMAL HIGH (ref 70–99)

## 2023-12-27 MED ORDER — TRAZODONE HCL 100 MG PO TABS
100.0000 mg | ORAL_TABLET | Freq: Every day | ORAL | Status: DC
  Administered 2023-12-27 – 2023-12-28 (×2): 100 mg via ORAL
  Filled 2023-12-27 (×2): qty 1

## 2023-12-27 MED ORDER — HYDROCOD POLI-CHLORPHE POLI ER 10-8 MG/5ML PO SUER
5.0000 mL | Freq: Once | ORAL | Status: AC
  Administered 2023-12-27: 5 mL via ORAL
  Filled 2023-12-27: qty 5

## 2023-12-27 MED ORDER — DEXAMETHASONE 4 MG PO TABS
4.0000 mg | ORAL_TABLET | Freq: Every day | ORAL | Status: AC
  Administered 2023-12-28: 4 mg via ORAL
  Filled 2023-12-27: qty 1

## 2023-12-27 NOTE — Progress Notes (Signed)
  Echocardiogram 2D Echocardiogram has been performed.  Tracy [name] 12/27/2023, 1:49 PM

## 2023-12-27 NOTE — Progress Notes (Signed)
 Progress Note   Patient: Tracy [name] ZOX:[id number] DOB: [birth year] DOA: 12/24/2023     1 DOS: the patient was seen and examined on 12/27/2023   Brief hospital course: LONIA ROANE is a 78 y.o. female with medical history significant for chronic bronchitis, hypertension, hyperlipidemia, MI, breast cancer in remission, multiple myeloma on immunotherapy being admitted to the hospital with cough, dyspnea, acute exacerbation of COPD.   Assessment and Plan: Acute exacerbation of COPD Metapneumo virus positive- Continues to have diffuse wheezing, dyspnea with exertion and nonproductive cough.  Chest xray shows emphysema. She has chest tightness, orthopnea. Troponin negative. Echocardiogram ordered. RVP panel positive for metapneumovirus. Finished azithro for 3 days. Continue scheduled DuoNebs, as needed albuterol  P.o. Decadron  with taper. Continue Mucinex , tessalon  perles as needed.  Generalized Weakness- presumably due to poor p.o. intake, and COPD exacerbation as above.  Patient lives alone, PT/ OT advised HH services.   GERD- Continue Protonix  and Carafate    Multiple myeloma- counts appear stable, follows routinely with Dr. Salomon Cree.  No acute hematological issue.   Diabetes-type 2- A1c 7.0. blood sugars stable. Taper steroids. Continue to monitor blood sugars, with sliding scale insulin .   Hyperlipidemia- continue Lipitor   Neuropathy- Continue gabapentin .  Obesity Class II- BMI 36.42 Diet, exercise and weight reduction. Advised sleep study outpatient to rule out sleep apnea.    Out of bed to chair. Incentive spirometry. Nursing supportive care. Fall, aspiration precautions. Diet:  Diet Orders (From admission, onward)     Start     Ordered   12/24/23 1043  Diet regular Room service appropriate? Yes; Fluid consistency: Thin  Diet effective now       Question Answer Comment  Room service appropriate? Yes   Fluid consistency: Thin      12/24/23 1042            DVT prophylaxis: enoxaparin  (LOVENOX ) injection 40 mg Start: 12/24/23 1400  Level of care: Med-Surg   Code Status: Full Code  Subjective: Patient is seen and examined today morning. She continues to have shortness of breath with exertion, productive cough. Asked for sleeping aid. No family at bedside.   Physical Exam: Vitals:   12/27/23 0109 12/27/23 0519 12/27/23 0823 12/27/23 1229  BP:  (!) 162/76  (!) 158/94  Pulse:  78  90  Resp:  20  18  Temp:  98 F (36.7 C)  98 F (36.7 C)  TempSrc:  Oral  Oral  SpO2: 96% 99% 98% 98%  Weight:      Height:        General - Elderly African American female, mild respiratory distress HEENT - PERRLA, EOMI, atraumatic head, non tender sinuses. Lung - Clear, diffuse rales, wheezes. Heart - S1, S2 heard, no murmurs, rubs, trace pedal edema. Abdomen - Soft, non tender, bowel sounds good Neuro - Alert, awake and oriented x 3, non focal exam. Skin - Warm and dry.  Data Reviewed:      Latest Ref Rng & Units 12/25/2023    4:32 AM 12/24/2023    7:03 AM 12/05/2023   12:06 PM  CBC  WBC 4.0 - 10.5 K/uL 4.0  5.4  5.4   Hemoglobin 12.0 - 15.0 g/dL 8.9  9.8  81.1   Hematocrit 36.0 - 46.0 % 27.8  29.8  29.4   Platelets 150 - 400 K/uL 215  231  346       Latest Ref Rng & Units 12/25/2023    4:32 AM 12/24/2023  7:03 AM 12/05/2023   12:06 PM  BMP  Glucose 70 - 99 mg/dL 161  096  045   BUN 8 - 23 mg/dL 14  11  10    Creatinine 0.44 - 1.00 mg/dL 4.09  8.11  9.14   Sodium 135 - 145 mmol/L 137  140  144   Potassium 3.5 - 5.1 mmol/L 3.7  3.8  4.0   Chloride 98 - 111 mmol/L 106  105  108   CO2 22 - 32 mmol/L 19  23  27    Calcium  8.9 - 10.3 mg/dL 8.6  8.6  9.1    No results found.   Family Communication: Discussed with patient, she understand and agree. All questions answered.  Disposition: Status is: changed to inpatient Inpatient because of her persistent symptoms, cardiac work up.  Planned Discharge Destination: Home with Home  Health     Time spent: 38 minutes  Author: Aisha Hove, MD 12/27/2023 1:36 PM Secure chat 7am to 7pm For on call review [email or web address]

## 2023-12-27 NOTE — Plan of Care (Signed)

## 2023-12-28 ENCOUNTER — Inpatient Hospital Stay (HOSPITAL_COMMUNITY)

## 2023-12-28 DIAGNOSIS — J441 Chronic obstructive pulmonary disease with (acute) exacerbation: Secondary | ICD-10-CM | POA: Diagnosis not present

## 2023-12-28 DIAGNOSIS — R531 Weakness: Secondary | ICD-10-CM | POA: Diagnosis not present

## 2023-12-28 DIAGNOSIS — C9001 Multiple myeloma in remission: Secondary | ICD-10-CM | POA: Diagnosis not present

## 2023-12-28 DIAGNOSIS — R051 Acute cough: Secondary | ICD-10-CM

## 2023-12-28 DIAGNOSIS — J208 Acute bronchitis due to other specified organisms: Secondary | ICD-10-CM | POA: Diagnosis not present

## 2023-12-28 LAB — GLUCOSE, CAPILLARY
Glucose-Capillary: 105 mg/dL — ABNORMAL HIGH (ref 70–99)
Glucose-Capillary: 160 mg/dL — ABNORMAL HIGH (ref 70–99)
Glucose-Capillary: 241 mg/dL — ABNORMAL HIGH (ref 70–99)
Glucose-Capillary: 186 mg/dL — ABNORMAL HIGH (ref 70–99)

## 2023-12-28 LAB — HEMOGLOBIN A1C
Hgb A1c MFr Bld: 6.7 % — ABNORMAL HIGH (ref 4.8–5.6)
Mean Plasma Glucose: 145.59 mg/dL

## 2023-12-28 MED ORDER — AMLODIPINE BESYLATE 5 MG PO TABS
5.0000 mg | ORAL_TABLET | Freq: Every day | ORAL | Status: DC
  Administered 2023-12-28 – 2023-12-29 (×2): 5 mg via ORAL
  Filled 2023-12-28 (×2): qty 1

## 2023-12-28 NOTE — Progress Notes (Signed)
 PT Cancellation Note  Patient Details Name: Tracy [name] MRN: [id number] DOB: [birth year]   Cancelled Treatment:    Reason Eval/Treat Not Completed: Patient declined, no reason specified    Tanda Falter, PT Acute Rehabilitation  Office: [phone]

## 2023-12-28 NOTE — Plan of Care (Signed)
  Problem: Education: Goal: Ability to describe self-care measures that may prevent or decrease complications (Diabetes Survival Skills Education) will improve Outcome: Progressing Goal: Individualized Educational Video(s) Outcome: Progressing   Problem: Coping: Goal: Ability to adjust to condition or change in health will improve Outcome: Progressing   Problem: Fluid Volume: Goal: Ability to maintain a balanced intake and output will improve Outcome: Progressing   Problem: Health Behavior/Discharge Planning: Goal: Ability to identify and utilize available resources and services will improve Outcome: Progressing Goal: Ability to manage health-related needs will improve Outcome: Progressing   Problem: Metabolic: Goal: Ability to maintain appropriate glucose levels will improve Outcome: Progressing   Problem: Nutritional: Goal: Maintenance of adequate nutrition will improve Outcome: Progressing Goal: Progress toward achieving an optimal weight will improve Outcome: Progressing   Problem: Tissue Perfusion: Goal: Adequacy of tissue perfusion will improve Outcome: Progressing   Problem: Skin Integrity: Goal: Risk for impaired skin integrity will decrease Outcome: Progressing   Problem: Education: Goal: Knowledge of General Education information will improve Description: Including pain rating scale, medication(s)/side effects and non-pharmacologic comfort measures Outcome: Progressing   Problem: Health Behavior/Discharge Planning: Goal: Ability to manage health-related needs will improve Outcome: Progressing   Problem: Clinical Measurements: Goal: Ability to maintain clinical measurements within normal limits will improve Outcome: Progressing Goal: Will remain free from infection Outcome: Progressing Goal: Diagnostic test results will improve Outcome: Progressing Goal: Respiratory complications will improve Outcome: Progressing Goal: Cardiovascular complication will  be avoided Outcome: Progressing   Problem: Elimination: Goal: Will not experience complications related to bowel motility Outcome: Progressing Goal: Will not experience complications related to urinary retention Outcome: Progressing   Problem: Coping: Goal: Level of anxiety will decrease Outcome: Progressing   Problem: Nutrition: Goal: Adequate nutrition will be maintained Outcome: Progressing   Problem: Pain Managment: Goal: General experience of comfort will improve and/or be controlled Outcome: Progressing

## 2023-12-28 NOTE — Progress Notes (Signed)
 Progress Note   Patient: Tracy [name] GNF:[id number] DOB: [birth year] DOA: 12/24/2023     2 DOS: the patient was seen and examined on 12/28/2023   Brief hospital course: Tracy [name] is a 78 y.o. female with medical history significant for chronic bronchitis, hypertension, hyperlipidemia, MI, breast cancer in remission, multiple myeloma on immunotherapy being admitted to the hospital with cough, dyspnea, acute exacerbation of COPD.   Assessment and Plan: Acute exacerbation of COPD Metapneumo virus positive- Continues to have diffuse wheezing, dyspnea with exertion and nonproductive cough.  Chest xray shows emphysema. She has chest tightness, orthopnea. Troponin negative. Echocardiogram normal EF, grade 1DD. RVP panel positive for metapneumovirus. Finished azithro for 3 days. CT chest high resolution ordered given her severe cough, shortness of breath. Consider pulmonology if CT abnormal. Continue scheduled DuoNebs, as needed albuterol  P.o. Decadron  taper. Continue Mucinex , tessalon  perles as needed.  Generalized Weakness- presumably due to poor p.o. intake, and COPD exacerbation as above.  Patient lives alone, PT/ OT advised HH services.   GERD- Continue Protonix  and Carafate    Multiple myeloma- counts appear stable, follows routinely with Dr. Salomon Cree.  No acute hematological issue.   Diabetes-type 2- A1c 7.0 06/2023. Repeat A1c add on.  Blood sugars elevated due to steroids. Taper steroids. Continue to monitor blood sugars, with sliding scale insulin .   Hyperlipidemia- continue Lipitor   Neuropathy- Continue gabapentin .  Obesity Class II- BMI 36.42 Diet, exercise and weight reduction. Advised sleep study outpatient to rule out sleep apnea.    Out of bed to chair. Incentive spirometry. Nursing supportive care. Fall, aspiration precautions. Diet:  Diet Orders (From admission, onward)     Start     Ordered   12/24/23 1043  Diet regular Room service appropriate? Yes;  Fluid consistency: Thin  Diet effective now       Question Answer Comment  Room service appropriate? Yes   Fluid consistency: Thin      12/24/23 1042           DVT prophylaxis: enoxaparin  (LOVENOX ) injection 40 mg Start: 12/24/23 1400  Level of care: Med-Surg   Code Status: Full Code  Subjective: Patient is seen and examined today morning. She continues to have persistent cough with green phlegm. Ambulates with worsening SOB.   Physical Exam: Vitals:   12/27/23 2050 12/28/23 0556 12/28/23 0814 12/28/23 1353  BP:  (!) 147/77    Pulse:  66    Resp:  18    Temp:  98 F (36.7 C)    TempSrc:  Oral    SpO2: 97% 100% 94% 96%  Weight:      Height:        General - Elderly African American female, mild respiratory distress HEENT - PERRLA, EOMI, atraumatic head, non tender sinuses. Lung - Clear, diffuse rales, wheezes. Heart - S1, S2 heard, no murmurs, rubs, trace pedal edema. Abdomen - Soft, non tender, bowel sounds good Neuro - Alert, awake and oriented x 3, non focal exam. Skin - Warm and dry.  Data Reviewed:      Latest Ref Rng & Units 12/25/2023    4:32 AM 12/24/2023    7:03 AM 12/05/2023   12:06 PM  CBC  WBC 4.0 - 10.5 K/uL 4.0  5.4  5.4   Hemoglobin 12.0 - 15.0 g/dL 8.9  9.8  84.6   Hematocrit 36.0 - 46.0 % 27.8  29.8  29.4   Platelets 150 - 400 K/uL 215  231  346  Latest Ref Rng & Units 12/25/2023    4:32 AM 12/24/2023    7:03 AM 12/05/2023   12:06 PM  BMP  Glucose 70 - 99 mg/dL 045  409  811   BUN 8 - 23 mg/dL 14  11  10    Creatinine 0.44 - 1.00 mg/dL 9.14  7.82  9.56   Sodium 135 - 145 mmol/L 137  140  144   Potassium 3.5 - 5.1 mmol/L 3.7  3.8  4.0   Chloride 98 - 111 mmol/L 106  105  108   CO2 22 - 32 mmol/L 19  23  27    Calcium  8.9 - 10.3 mg/dL 8.6  8.6  9.1    ECHOCARDIOGRAM COMPLETE Result Date: 12/27/2023    ECHOCARDIOGRAM REPORT   Patient Name:   Tracy [name] Date of Exam: 12/27/2023 Medical Rec #:  [id number]      Height:       60.0 in  Accession #:    [id number]     Weight:       186.5 lb Date of Birth:  Jun 10, 1946      BSA:          1.812 m Patient Age:    57 years       BP:           162/76 mmHg Patient Gender: F              HR:           83 bpm. Exam Location:  Inpatient Procedure: 2D Echo, Cardiac Doppler, Color Doppler and Strain Analysis (Both            Spectral and Color Flow Doppler were utilized during procedure). Indications:    I50.40* Unspecified combined systolic (congestive) and diastolic                 (congestive) heart failure  History:        Patient has prior history of Echocardiogram examinations. CAD,                 COPD and TIA, Signs/Symptoms:Dizziness/Lightheadedness and                 Syncope; Risk Factors:Hypertension, Dyslipidemia, Sleep Apnea                 and Diabetes. Cancer.  Sonographer:    Raynelle Callow RDCS Referring Phys: [id number] [facility]  Sonographer Comments: Patient is obese. Image acquisition challenging due to patient body habitus. IMPRESSIONS  1. Left ventricular ejection fraction, by estimation, is 65 to 70%. The left ventricle has normal function. The left ventricle has no regional wall motion abnormalities. Left ventricular diastolic parameters are consistent with Grade I diastolic dysfunction (impaired relaxation). Elevated left atrial pressure. The average left ventricular global longitudinal strain is -26.6 %. The global longitudinal strain is normal.  2. Right ventricular systolic function is normal. The right ventricular size is normal. Tricuspid regurgitation signal is inadequate for assessing PA pressure.  3. The mitral valve is degenerative. Trivial mitral valve regurgitation. Mild mitral stenosis. The mean mitral valve gradient is 4.0 mmHg.  4. The aortic valve was not well visualized. Aortic valve regurgitation is not visualized. No aortic stenosis is present.  5. The inferior vena cava is normal in size with greater than 50% respiratory variability, suggesting right atrial  pressure of 3 mmHg. FINDINGS  Left Ventricle: Left ventricular ejection fraction, by estimation, is 65 to 70%. The left ventricle has  normal function. The left ventricle has no regional wall motion abnormalities. The average left ventricular global longitudinal strain is -26.6 %. Strain was performed and the global longitudinal strain is normal. The left ventricular internal cavity size was normal in size. There is no left ventricular hypertrophy. Left ventricular diastolic parameters are consistent with Grade I diastolic dysfunction (impaired relaxation). Elevated left atrial pressure. Right Ventricle: The right ventricular size is normal. Right vetricular wall thickness was not well visualized. Right ventricular systolic function is normal. Tricuspid regurgitation signal is inadequate for assessing PA pressure. Left Atrium: Left atrial size was normal in size. Right Atrium: Right atrial size was normal in size. Pericardium: There is no evidence of pericardial effusion. Mitral Valve: The mitral valve is degenerative in appearance. There is mild thickening of the mitral valve leaflet(s). There is mild calcification of the mitral valve leaflet(s). Mild to moderate mitral annular calcification. Trivial mitral valve regurgitation. Mild mitral valve stenosis. MV peak gradient, 11.4 mmHg. The mean mitral valve gradient is 4.0 mmHg. Tricuspid Valve: The tricuspid valve is normal in structure. Tricuspid valve regurgitation is trivial. No evidence of tricuspid stenosis. Aortic Valve: The aortic valve was not well visualized. Aortic valve regurgitation is not visualized. No aortic stenosis is present. Aortic valve mean gradient measures 4.2 mmHg. Aortic valve peak gradient measures 11.5 mmHg. Aortic valve area, by VTI measures 2.40 cm. Pulmonic Valve: The pulmonic valve was not well visualized. Pulmonic valve regurgitation is not visualized. No evidence of pulmonic stenosis. Aorta: The aortic root and ascending aorta are  structurally normal, with no evidence of dilitation. Venous: The inferior vena cava is normal in size with greater than 50% respiratory variability, suggesting right atrial pressure of 3 mmHg. IAS/Shunts: No atrial level shunt detected by color flow Doppler.  LEFT VENTRICLE PLAX 2D LVIDd:         4.00 cm     Diastology LVIDs:         2.70 cm     LV e' medial:    8.05 cm/s LV PW:         1.00 cm     LV E/e' medial:  16.0 LV IVS:        0.80 cm     LV e' lateral:   9.57 cm/s LVOT diam:     2.00 cm     LV E/e' lateral: 13.4 LV SV:         76 LV SV Index:   42          2D Longitudinal Strain LVOT Area:     3.14 cm    2D Strain GLS Avg:     -26.6 %  LV Volumes (MOD) LV vol d, MOD A2C: 54.0 ml LV vol d, MOD A4C: 63.3 ml LV vol s, MOD A2C: 8.5 ml LV vol s, MOD A4C: 22.0 ml LV SV MOD A2C:     45.6 ml LV SV MOD A4C:     63.3 ml LV SV MOD BP:      45.5 ml RIGHT VENTRICLE             IVC RV S prime:     13.10 cm/s  IVC diam: 2.30 cm TAPSE (M-mode): 2.2 cm LEFT ATRIUM             Index        RIGHT ATRIUM           Index LA diam:        3.20 cm 1.77  cm/m   RA Area:     11.40 cm LA Vol (A2C):   32.7 ml 18.05 ml/m  RA Volume:   22.30 ml  12.31 ml/m LA Vol (A4C):   26.6 ml 14.68 ml/m LA Biplane Vol: 32.1 ml 17.72 ml/m  AORTIC VALVE AV Area (Vmax):    2.36 cm AV Area (Vmean):   2.85 cm AV Area (VTI):     2.40 cm AV Vmax:           169.67 cm/s AV Vmean:          91.325 cm/s AV VTI:            0.315 m AV Peak Grad:      11.5 mmHg AV Mean Grad:      4.2 mmHg LVOT Vmax:         127.50 cm/s LVOT Vmean:        82.900 cm/s LVOT VTI:          0.241 m LVOT/AV VTI ratio: 0.76  AORTA Ao Root diam: 2.80 cm Ao Asc diam:  3.00 cm MITRAL VALVE MV Area (PHT): 3.27 cm     SHUNTS MV Area VTI:   1.51 cm     Systemic VTI:  0.24 m MV Peak grad:  11.4 mmHg    Systemic Diam: 2.00 cm MV Mean grad:  4.0 mmHg MV Vmax:       1.69 m/s MV Vmean:      91.2 cm/s MV Decel Time: 232 msec MV E velocity: 128.50 cm/s MV A velocity: 167.50 cm/s MV E/A  ratio:  0.77 Armida Lander MD Electronically signed by Armida Lander MD Signature Date/Time: 12/27/2023/4:05:54 PM    Final    Family Communication: Discussed with patient, she understand and agree. All questions answered.  Disposition: Status is: changed to inpatient Inpatient because of her persistent symptoms, CT chest pending.  Planned Discharge Destination: Home with Home Health     Time spent: 39 minutes  Author: Aisha Hove, MD 12/28/2023 1:56 PM Secure chat 7am to 7pm For on call review [email or web address].

## 2023-12-29 ENCOUNTER — Other Ambulatory Visit (HOSPITAL_COMMUNITY): Payer: Self-pay

## 2023-12-29 LAB — EXPECTORATED SPUTUM ASSESSMENT W GRAM STAIN, RFLX TO RESP C

## 2023-12-29 LAB — GLUCOSE, CAPILLARY
Glucose-Capillary: 121 mg/dL — ABNORMAL HIGH (ref 70–99)
Glucose-Capillary: 113 mg/dL — ABNORMAL HIGH (ref 70–99)

## 2023-12-29 MED ORDER — SPIRIVA RESPIMAT 2.5 MCG/ACT IN AERS
2.0000 | INHALATION_SPRAY | Freq: Two times a day (BID) | RESPIRATORY_TRACT | 0 refills | Status: AC
  Filled 2023-12-29: qty 4, 30d supply, fill #0

## 2023-12-29 MED ORDER — ALBUTEROL SULFATE HFA 108 (90 BASE) MCG/ACT IN AERS
2.0000 | INHALATION_SPRAY | Freq: Four times a day (QID) | RESPIRATORY_TRACT | 0 refills | Status: DC | PRN
  Filled 2023-12-29: qty 6.7, 25d supply, fill #0

## 2023-12-29 NOTE — Plan of Care (Signed)
  Problem: Education: Goal: Ability to describe self-care measures that may prevent or decrease complications (Diabetes Survival Skills Education) will improve Outcome: Adequate for Discharge Goal: Individualized Educational Video(s) Outcome: Adequate for Discharge   Problem: Coping: Goal: Ability to adjust to condition or change in health will improve Outcome: Adequate for Discharge   Problem: Fluid Volume: Goal: Ability to maintain a balanced intake and output will improve Outcome: Adequate for Discharge   Problem: Health Behavior/Discharge Planning: Goal: Ability to identify and utilize available resources and services will improve Outcome: Adequate for Discharge Goal: Ability to manage health-related needs will improve Outcome: Adequate for Discharge   Problem: Metabolic: Goal: Ability to maintain appropriate glucose levels will improve Outcome: Adequate for Discharge   Problem: Nutritional: Goal: Maintenance of adequate nutrition will improve Outcome: Adequate for Discharge Goal: Progress toward achieving an optimal weight will improve Outcome: Adequate for Discharge   Problem: Skin Integrity: Goal: Risk for impaired skin integrity will decrease Outcome: Adequate for Discharge   Problem: Tissue Perfusion: Goal: Adequacy of tissue perfusion will improve Outcome: Adequate for Discharge   Problem: Education: Goal: Knowledge of General Education information will improve Description: Including pain rating scale, medication(s)/side effects and non-pharmacologic comfort measures Outcome: Adequate for Discharge   Problem: Health Behavior/Discharge Planning: Goal: Ability to manage health-related needs will improve Outcome: Adequate for Discharge   Problem: Clinical Measurements: Goal: Ability to maintain clinical measurements within normal limits will improve Outcome: Adequate for Discharge Goal: Will remain free from infection Outcome: Adequate for Discharge Goal:  Diagnostic test results will improve Outcome: Adequate for Discharge Goal: Respiratory complications will improve Outcome: Adequate for Discharge Goal: Cardiovascular complication will be avoided Outcome: Adequate for Discharge   Problem: Nutrition: Goal: Adequate nutrition will be maintained Outcome: Adequate for Discharge   Problem: Activity: Goal: Risk for activity intolerance will decrease Outcome: Adequate for Discharge   Problem: Coping: Goal: Level of anxiety will decrease Outcome: Adequate for Discharge   Problem: Elimination: Goal: Will not experience complications related to bowel motility Outcome: Adequate for Discharge Goal: Will not experience complications related to urinary retention Outcome: Adequate for Discharge   Problem: Pain Managment: Goal: General experience of comfort will improve and/or be controlled Outcome: Adequate for Discharge   Problem: Safety: Goal: Ability to remain free from injury will improve Outcome: Adequate for Discharge   Problem: Skin Integrity: Goal: Risk for impaired skin integrity will decrease Outcome: Adequate for Discharge

## 2023-12-29 NOTE — Discharge Summary (Signed)
 Physician Discharge Summary  Tracy [name] WUJ:[id number] DOB: 01/31/1946 DOA: 12/24/2023  PCP: Jess Morita, MD  Admit date: 12/24/2023 Discharge date: 12/29/2023 30 Day Unplanned Readmission Risk Score    Flowsheet Row ED to Hosp-Admission (Current) from 12/24/2023 in [facility]  30 Day Unplanned Readmission Risk Score (%) 16.51 Filed at 12/29/2023 0801       This score is the patient's risk of an unplanned readmission within 30 days of being discharged (0 -100%). The score is based on dignosis, age, lab data, medications, orders, and past utilization.   Low:  0-14.9   Medium: 15-21.9   High: 22-29.9   Extreme: 30 and above          Admitted From: Home Disposition: Home  Recommendations for Outpatient Follow-up:  Follow up with PCP in 1-2 weeks Please obtain BMP/CBC in one week Please follow up with your PCP on the following pending results: Unresulted Labs (From admission, onward)     Start     Ordered   12/28/23 0858  Culture, Respiratory w Gram Stain  Once,   R        12/28/23 0858              Home Health: Yes Equipment/Devices: None  Discharge Condition: Stable CODE STATUS: Full code Diet recommendation: Cardiac  Subjective: Seen and examined.  Other than cough and shortness of breath while coughing, no other complaints.  In the end, she was agreeable with going home.  Details of the hospitalization and discussion with the patient as below.  Brief/Interim Summary: JAREN KEARN is a 78 y.o. female with medical history significant for chronic bronchitis, hypertension, hyperlipidemia, MI, breast cancer in remission, multiple myeloma on immunotherapy being admitted to the hospital with cough, dyspnea, acute exacerbation of COPD.   Acute exacerbation of COPD secondary to metapneumovirus infection/viral pneumonia, POA: Chest xray shows emphysema. Echocardiogram normal EF, grade 1DD. RVP panel positive for  metapneumovirus. Finished azithro for 3 days.  Discharging on 2 more days of azithromycin . CT chest high resolution rule out interstitial lung disease.  Found to have emphysema again.  She was treated symptomatically with bronchodilators and steroids.  Patient's only complaint is shortness of breath while coughing.  When she is not coughing, patient is able to speak in full sentences.  While she is not coughing, she has no wheezes at all.  She has brief and temporary wheezes only while coughing and I explained to her that this is expected course of a viral pneumonia in a patient with COPD.  When I brought up the COPD worried, she immediately interrupted me and said " I do not have COPD, my doctor said I have chronic bronchitis but no of COPD".  I educated patient that chronic bronchitis is also a type of COPD however the CT scan here shows that she likely has emphysema.  I also offered her to read more about COPD.  I have placed instructions and have advised the RN to print all the knowledge about COPD to the patient and encouraged the patient to have further discussion with her PCP about COPD and requested referral to see pulmonologist.  After lengthy discussion, patient was agreeable to going home.  We also checked her ambulatory oximetry and she was saturating 96% on room air even when coughing.  Patient requested inhalers to go home with.  To start the treatment, I have started her Respimat twice daily scheduled and albuterol  inhaler as needed.  Also patient already received 4 to 5 days of prednisone  and with no wheezes now, I do not think it is indicated anymore at the time of discharge.   Generalized Weakness- presumably due to poor p.o. intake, and COPD exacerbation as above.  Patient lives alone, PT/ OT advised HH services.   GERD- Continue Protonix  and Carafate    Multiple myeloma- counts appear stable, follows routinely with Dr. Salomon Cree.  No acute hematological issue.   Diabetes-type 2- A1c 7.0  06/2023. Repeat A1c add on.  Blood sugars elevated due to steroids.  Resume home medications.   Hyperlipidemia- continue Lipitor   Neuropathy- Continue gabapentin .   Obesity Class II- BMI 36.42 Diet, exercise and weight reduction. Advised sleep study outpatient to rule out sleep apnea.  Discharge plan was discussed with patient and/or family member and they verbalized understanding and agreed with it.  Discharge Diagnoses:  Principal Problem:   COPD with acute exacerbation (HCC) Active Problems:   COPD exacerbation (HCC)   Acute bronchitis due to human metapneumovirus    Discharge Instructions   Allergies as of 12/29/2023       Reactions   Bacitracin-neomycin-polymyxin  [neomycin-bacitracin Zn-polymyx] Swelling   Latex Swelling   Nsaids Other (See Comments)   nosebleeds   Prednisone  Swelling   Pt tolerates Dexamethasone . Throat swelling   Tape Hives   Pt cannot tolerate bandaids, tape, or any other adhesives.    Ambien [zolpidem Tartrate] Other (See Comments)   Hives   Amoxicillin Other (See Comments)   Rash   Clavulanic Acid Hives   Contrast Media [iodinated Contrast Media] Hives   Pt states hives with prior ct, was given benadryl  to resolve   Tessalon  [benzonatate ] Itching, Rash        Medication List     STOP taking these medications    HYDROcodone -acetaminophen  5-325 MG tablet Commonly known as: NORCO/VICODIN       TAKE these medications    Accu-Chek Guide test strip Generic drug: glucose blood Use as instructed to check sugars 1-2 times daily.   acyclovir  400 MG tablet Commonly known as: ZOVIRAX  Take 1 tablet (400 mg total) by mouth 2 (two) times daily.   albuterol  108 (90 Base) MCG/ACT inhaler Commonly known as: VENTOLIN  HFA Inhale 2 puffs into the lungs every 6 (six) hours as needed for wheezing or shortness of breath.   atorvastatin  10 MG tablet Commonly known as: LIPITOR TAKE 1 TABLET BY MOUTH DAILY   dicyclomine  10 MG  capsule Commonly known as: BENTYL  Take 1 capsule (10 mg total) by mouth every 6 (six) hours. What changed:  when to take this reasons to take this   gabapentin  300 MG capsule Commonly known as: NEURONTIN  Take 2 capsules (600 mg total) by mouth 3 (three) times daily.   lidocaine  5 % Commonly known as: Lidoderm  Place 1 patch onto the skin daily. Remove & Discard patch within 12 hours or as directed by MD What changed:  when to take this reasons to take this additional instructions   lidocaine -prilocaine  cream Commonly known as: EMLA  APPLY TOPICALLY TO PORT SITE 1  HOUR PRIOR TO ACCESS AS NEEDED   meclizine  25 MG tablet Commonly known as: ANTIVERT  Take 1 tablet (25 mg total) by mouth 3 (three) times daily as needed for dizziness. What changed:  when to take this reasons to take this   metFORMIN  500 MG tablet Commonly known as: GLUCOPHAGE  TAKE 1 TABLET BY MOUTH TWICE  DAILY WITH MEALS   ondansetron  8 MG tablet  Commonly known as: ZOFRAN  Take 1 tablet (8 mg total) by mouth every 8 (eight) hours as needed for nausea or vomiting.   pantoprazole  40 MG tablet Commonly known as: PROTONIX  TAKE 1 TABLET BY MOUTH TWICE  DAILY   Spiriva Respimat 2.5 MCG/ACT Aers Generic drug: Tiotropium Bromide Monohydrate Inhale 2 puffs into the lungs 2 (two) times daily.   tiZANidine  4 MG tablet Commonly known as: ZANAFLEX  TAKE 1 TABLET BY MOUTH AT  BEDTIME What changed:  how much to take how to take this when to take this reasons to take this additional instructions   traZODone  100 MG tablet Commonly known as: DESYREL  TAKE 1 TABLET BY MOUTH AT  BEDTIME   Vitamin D  (Ergocalciferol ) 1.25 MG (50000 UNIT) Caps capsule Commonly known as: DRISDOL  TAKE 1 CAPSULE BY MOUTH ONCE  WEEKLY        Follow-up Information     Jess Morita, MD Follow up in 1 week(s).   Specialty: Family Medicine Contact information: [address] [place] [address] [phone]                 Allergies  Allergen Reactions   Bacitracin-Neomycin-Polymyxin  [Neomycin-Bacitracin Zn-Polymyx] Swelling   Latex Swelling   Nsaids Other (See Comments)    nosebleeds   Prednisone  Swelling    Pt tolerates Dexamethasone . Throat swelling   Tape Hives    Pt cannot tolerate bandaids, tape, or any other adhesives.    Ambien [Zolpidem Tartrate] Other (See Comments)    Hives    Amoxicillin Other (See Comments)    Rash    Clavulanic Acid Hives   Contrast Media [Iodinated Contrast Media] Hives    Pt states hives with prior ct, was given benadryl  to resolve   Tessalon  [Benzonatate ] Itching and Rash    Consultations: None   Procedures/Studies: CT Chest High Resolution Result Date: 12/29/2023 CLINICAL DATA:  Respiratory illness, immunodeficiency.  Cough. EXAM: CT CHEST WITHOUT CONTRAST TECHNIQUE: Multidetector CT imaging of the chest was performed following the standard protocol without intravenous contrast. High resolution imaging of the lungs, as well as inspiratory and expiratory imaging, was performed. RADIATION DOSE REDUCTION: This exam was performed according to the departmental dose-optimization program which includes automated exposure control, adjustment of the mA and/or kV according to patient size and/or use of iterative reconstruction technique. COMPARISON:  07/13/2023. FINDINGS: Cardiovascular: Right IJ Port-A-Cath terminates in the low SVC. Atherosclerotic calcification of the aorta, aortic valve and coronary arteries. Heart is at the upper limits of normal in size to mildly enlarged. Trace pericardial effusion. Mediastinum/Nodes: No pathologically enlarged mediastinal or axillary lymph nodes. Hilar regions are difficult to definitively evaluate without IV contrast. Surgical clips in the right axilla and right breast with skin thickening in the right breast. Esophagus is grossly unremarkable. Lungs/Pleura: Centrilobular emphysema. Bibasilar scarring. Negative for subpleural  reticulation, traction bronchiectasis/bronchiolectasis, ground glass, architectural distortion or honeycombing. Atelectasis in the posteromedial left lower lobe. Minimal scattered mucoid impaction. No pleural fluid. Minimal debris in the airway. No air trapping. Upper Abdomen: Visualized portions of the liver, adrenal glands, spleen and stomach are grossly unremarkable. Musculoskeletal: Degenerative changes in the spine. IMPRESSION: 1. No evidence of interstitial lung disease. 2. Trace pericardial effusion. 3. Aortic atherosclerosis (ICD10-I70.0). Coronary artery calcification. 4.  Emphysema (ICD10-J43.9). Electronically Signed   By: Shearon Denis M.D.   On: 12/29/2023 08:38   ECHOCARDIOGRAM COMPLETE Result Date: 12/27/2023    ECHOCARDIOGRAM REPORT   Patient Name:   ARENA LINDAHL BJYN Date  of Exam: 12/27/2023 Medical Rec #:  [id number]      Height:       60.0 in Accession #:    [id number]     Weight:       186.5 lb Date of Birth:  1945-09-18      BSA:          1.812 m Patient Age:    64 years       BP:           162/76 mmHg Patient Gender: F              HR:           83 bpm. Exam Location:  Inpatient Procedure: 2D Echo, Cardiac Doppler, Color Doppler and Strain Analysis (Both            Spectral and Color Flow Doppler were utilized during procedure). Indications:    I50.40* Unspecified combined systolic (congestive) and diastolic                 (congestive) heart failure  History:        Patient has prior history of Echocardiogram examinations. CAD,                 COPD and TIA, Signs/Symptoms:Dizziness/Lightheadedness and                 Syncope; Risk Factors:Hypertension, Dyslipidemia, Sleep Apnea                 and Diabetes. Cancer.  Sonographer:    Raynelle Callow RDCS Referring Phys: [id number] [facility]  Sonographer Comments: Patient is obese. Image acquisition challenging due to patient body habitus. IMPRESSIONS  1. Left ventricular ejection fraction, by estimation, is 65 to 70%. The left ventricle has  normal function. The left ventricle has no regional wall motion abnormalities. Left ventricular diastolic parameters are consistent with Grade I diastolic dysfunction (impaired relaxation). Elevated left atrial pressure. The average left ventricular global longitudinal strain is -26.6 %. The global longitudinal strain is normal.  2. Right ventricular systolic function is normal. The right ventricular size is normal. Tricuspid regurgitation signal is inadequate for assessing PA pressure.  3. The mitral valve is degenerative. Trivial mitral valve regurgitation. Mild mitral stenosis. The mean mitral valve gradient is 4.0 mmHg.  4. The aortic valve was not well visualized. Aortic valve regurgitation is not visualized. No aortic stenosis is present.  5. The inferior vena cava is normal in size with greater than 50% respiratory variability, suggesting right atrial pressure of 3 mmHg. FINDINGS  Left Ventricle: Left ventricular ejection fraction, by estimation, is 65 to 70%. The left ventricle has normal function. The left ventricle has no regional wall motion abnormalities. The average left ventricular global longitudinal strain is -26.6 %. Strain was performed and the global longitudinal strain is normal. The left ventricular internal cavity size was normal in size. There is no left ventricular hypertrophy. Left ventricular diastolic parameters are consistent with Grade I diastolic dysfunction (impaired relaxation). Elevated left atrial pressure. Right Ventricle: The right ventricular size is normal. Right vetricular wall thickness was not well visualized. Right ventricular systolic function is normal. Tricuspid regurgitation signal is inadequate for assessing PA pressure. Left Atrium: Left atrial size was normal in size. Right Atrium: Right atrial size was normal in size. Pericardium: There is no evidence of pericardial effusion. Mitral Valve: The mitral valve is degenerative in appearance. There is mild thickening of  the mitral valve leaflet(s). There is mild  calcification of the mitral valve leaflet(s). Mild to moderate mitral annular calcification. Trivial mitral valve regurgitation. Mild mitral valve stenosis. MV peak gradient, 11.4 mmHg. The mean mitral valve gradient is 4.0 mmHg. Tricuspid Valve: The tricuspid valve is normal in structure. Tricuspid valve regurgitation is trivial. No evidence of tricuspid stenosis. Aortic Valve: The aortic valve was not well visualized. Aortic valve regurgitation is not visualized. No aortic stenosis is present. Aortic valve mean gradient measures 4.2 mmHg. Aortic valve peak gradient measures 11.5 mmHg. Aortic valve area, by VTI measures 2.40 cm. Pulmonic Valve: The pulmonic valve was not well visualized. Pulmonic valve regurgitation is not visualized. No evidence of pulmonic stenosis. Aorta: The aortic root and ascending aorta are structurally normal, with no evidence of dilitation. Venous: The inferior vena cava is normal in size with greater than 50% respiratory variability, suggesting right atrial pressure of 3 mmHg. IAS/Shunts: No atrial level shunt detected by color flow Doppler.  LEFT VENTRICLE PLAX 2D LVIDd:         4.00 cm     Diastology LVIDs:         2.70 cm     LV e' medial:    8.05 cm/s LV PW:         1.00 cm     LV E/e' medial:  16.0 LV IVS:        0.80 cm     LV e' lateral:   9.57 cm/s LVOT diam:     2.00 cm     LV E/e' lateral: 13.4 LV SV:         76 LV SV Index:   42          2D Longitudinal Strain LVOT Area:     3.14 cm    2D Strain GLS Avg:     -26.6 %  LV Volumes (MOD) LV vol d, MOD A2C: 54.0 ml LV vol d, MOD A4C: 63.3 ml LV vol s, MOD A2C: 8.5 ml LV vol s, MOD A4C: 22.0 ml LV SV MOD A2C:     45.6 ml LV SV MOD A4C:     63.3 ml LV SV MOD BP:      45.5 ml RIGHT VENTRICLE             IVC RV S prime:     13.10 cm/s  IVC diam: 2.30 cm TAPSE (M-mode): 2.2 cm LEFT ATRIUM             Index        RIGHT ATRIUM           Index LA diam:        3.20 cm 1.77 cm/m   RA Area:      11.40 cm LA Vol (A2C):   32.7 ml 18.05 ml/m  RA Volume:   22.30 ml  12.31 ml/m LA Vol (A4C):   26.6 ml 14.68 ml/m LA Biplane Vol: 32.1 ml 17.72 ml/m  AORTIC VALVE AV Area (Vmax):    2.36 cm AV Area (Vmean):   2.85 cm AV Area (VTI):     2.40 cm AV Vmax:           169.67 cm/s AV Vmean:          91.325 cm/s AV VTI:            0.315 m AV Peak Grad:      11.5 mmHg AV Mean Grad:      4.2 mmHg LVOT Vmax:  127.50 cm/s LVOT Vmean:        82.900 cm/s LVOT VTI:          0.241 m LVOT/AV VTI ratio: 0.76  AORTA Ao Root diam: 2.80 cm Ao Asc diam:  3.00 cm MITRAL VALVE MV Area (PHT): 3.27 cm     SHUNTS MV Area VTI:   1.51 cm     Systemic VTI:  0.24 m MV Peak grad:  11.4 mmHg    Systemic Diam: 2.00 cm MV Mean grad:  4.0 mmHg MV Vmax:       1.69 m/s MV Vmean:      91.2 cm/s MV Decel Time: 232 msec MV E velocity: 128.50 cm/s MV A velocity: 167.50 cm/s MV E/A ratio:  0.77 Armida Lander MD Electronically signed by Armida Lander MD Signature Date/Time: 12/27/2023/4:05:54 PM    Final    DG Chest 2 View Result Date: 12/24/2023 CLINICAL DATA:  78 year old female with cough and fever for 3 days. History of multiple myeloma. EXAM: CHEST - 2 VIEW COMPARISON:  CTA chest 07/13/2023 and earlier. FINDINGS: Semi upright AP and lateral views of the chest 0644 hours. Chronic right chest power port appears stable. Centrilobular emphysema on the previous CT. Mediastinal contour stable and within normal limits. Stable chronic increased pulmonary interstitium with no pneumothorax, pulmonary edema, pleural effusion or confluent opacity. Paucity of bowel gas. Stable visualized osseous structures. IMPRESSION: 1. No acute cardiopulmonary abnormality. 2.  Emphysema (ICD10-J43.9). Right chest port. Electronically Signed   By: Marlise Simpers M.D.   On: 12/24/2023 06:57     Discharge Exam: Vitals:   12/29/23 0406 12/29/23 0801  BP: (!) 132/56   Pulse: 65   Resp: 18   Temp: 98.2 F (36.8 C)   SpO2: 96% 95%   Vitals:   12/28/23 2010  12/28/23 2059 12/29/23 0406 12/29/23 0801  BP: (!) 173/76  (!) 132/56   Pulse: 76  65   Resp: 18  18   Temp: 98.4 F (36.9 C)  98.2 F (36.8 C)   TempSrc:   Oral   SpO2: 100% 97% 96% 95%  Weight:      Height:        General: Pt is alert, awake, not in acute distress Cardiovascular: RRR, S1/S2 +, no rubs, no gallops Respiratory: CTA bilaterally, no wheezing, no rhonchi Abdominal: Soft, NT, ND, bowel sounds + Extremities: no edema, no cyanosis    The results of significant diagnostics from this hospitalization (including imaging, microbiology, ancillary and laboratory) are listed below for reference.     Microbiology: Recent Results (from the past 240 hours)  Resp panel by RT-PCR (RSV, Flu A&B, Covid) Anterior Nasal Swab     Status: None   Collection Time: 12/24/23  6:26 AM   Specimen: Anterior Nasal Swab  Result Value Ref Range Status   SARS Coronavirus 2 by RT PCR NEGATIVE NEGATIVE Final    Comment: (NOTE) SARS-CoV-2 target nucleic acids are NOT DETECTED.  The SARS-CoV-2 RNA is generally detectable in upper respiratory specimens during the acute phase of infection. The lowest concentration of SARS-CoV-2 viral copies this assay can detect is 138 copies/mL. A negative result does not preclude SARS-Cov-2 infection and should not be used as the sole basis for treatment or other patient management decisions. A negative result may occur with  improper specimen collection/handling, submission of specimen other than nasopharyngeal swab, presence of viral mutation(s) within the areas targeted by this assay, and inadequate number of viral copies(<138 copies/mL). A negative result  must be combined with clinical observations, patient history, and epidemiological information. The expected result is Negative.  Fact Sheet for Patients:  BloggerCourse.com  Fact Sheet for Healthcare Providers:  SeriousBroker.it  This test is no t  yet approved or cleared by the [place]  FDA and  has been authorized for detection and/or diagnosis of SARS-CoV-2 by FDA under an Emergency Use Authorization (EUA). This EUA will remain  in effect (meaning this test can be used) for the duration of the COVID-19 declaration under Section 564(b)(1) of the Act, 21 U.S.C.section 360bbb-3(b)(1), unless the authorization is terminated  or revoked sooner.       Influenza A by PCR NEGATIVE NEGATIVE Final   Influenza B by PCR NEGATIVE NEGATIVE Final    Comment: (NOTE) The Xpert Xpress SARS-CoV-2/FLU/RSV plus assay is intended as an aid in the diagnosis of influenza from Nasopharyngeal swab specimens and should not be used as a sole basis for treatment. Nasal washings and aspirates are unacceptable for Xpert Xpress SARS-CoV-2/FLU/RSV testing.  Fact Sheet for Patients: BloggerCourse.com  Fact Sheet for Healthcare Providers: SeriousBroker.it  This test is not yet approved or cleared by the [place]  FDA and has been authorized for detection and/or diagnosis of SARS-CoV-2 by FDA under an Emergency Use Authorization (EUA). This EUA will remain in effect (meaning this test can be used) for the duration of the COVID-19 declaration under Section 564(b)(1) of the Act, 21 U.S.C. section 360bbb-3(b)(1), unless the authorization is terminated or revoked.     Resp Syncytial Virus by PCR NEGATIVE NEGATIVE Final    Comment: (NOTE) Fact Sheet for Patients: BloggerCourse.com  Fact Sheet for Healthcare Providers: SeriousBroker.it  This test is not yet approved or cleared by the [place]  FDA and has been authorized for detection and/or diagnosis of SARS-CoV-2 by FDA under an Emergency Use Authorization (EUA). This EUA will remain in effect (meaning this test can be used) for the duration of the COVID-19 declaration under Section 564(b)(1)  of the Act, 21 U.S.C. section 360bbb-3(b)(1), unless the authorization is terminated or revoked.  Performed at [facility] [address], [place], [place] [address]   Respiratory (~20 pathogens) panel by PCR     Status: Abnormal   Collection Time: 12/26/23  9:46 AM   Specimen: Nasopharyngeal Swab; Respiratory  Result Value Ref Range Status   Adenovirus NOT DETECTED NOT DETECTED Final   Coronavirus 229E NOT DETECTED NOT DETECTED Final    Comment: (NOTE) The Coronavirus on the Respiratory Panel, DOES NOT test for the novel  Coronavirus (2019 nCoV)    Coronavirus HKU1 NOT DETECTED NOT DETECTED Final   Coronavirus NL63 NOT DETECTED NOT DETECTED Final   Coronavirus OC43 NOT DETECTED NOT DETECTED Final   Metapneumovirus DETECTED (A) NOT DETECTED Final   Rhinovirus / Enterovirus NOT DETECTED NOT DETECTED Final   Influenza A NOT DETECTED NOT DETECTED Final   Influenza B NOT DETECTED NOT DETECTED Final   Parainfluenza Virus 1 NOT DETECTED NOT DETECTED Final   Parainfluenza Virus 2 NOT DETECTED NOT DETECTED Final   Parainfluenza Virus 3 NOT DETECTED NOT DETECTED Final   Parainfluenza Virus 4 NOT DETECTED NOT DETECTED Final   Respiratory Syncytial Virus NOT DETECTED NOT DETECTED Final   Bordetella pertussis NOT DETECTED NOT DETECTED Final   Bordetella Parapertussis NOT DETECTED NOT DETECTED Final   Chlamydophila pneumoniae NOT DETECTED NOT DETECTED Final   Mycoplasma pneumoniae NOT DETECTED NOT DETECTED Final    Comment: Performed at [facility], [address] [place], [place] [address]  Expectorated Sputum Assessment w Gram Stain, Rflx to Resp Cult     Status: None   Collection Time: 12/28/23  8:58 AM   Specimen: Expectorated Sputum  Result Value Ref Range Status   Specimen Description EXPECTORATED SPUTUM  Final   Special Requests NONE  Final   Sputum evaluation   Final    THIS SPECIMEN IS ACCEPTABLE FOR SPUTUM CULTURE Performed at [facility], [address], [place], [place] [address]    Report Status 12/29/2023 FINAL  Final     Labs: BNP (last 3 results) No results for input(s): "BNP" in the last 8760 hours. Basic Metabolic Panel: Recent Labs  Lab 12/24/23 0703 12/25/23 0432  NA 140 137  K 3.8 3.7  CL 105 106  CO2 23 19*  GLUCOSE 121* 173*  BUN 11 14  CREATININE 1.13* 1.20*  CALCIUM  8.6* 8.6*   Liver Function Tests: No results for input(s): "AST", "ALT", "ALKPHOS", "BILITOT", "PROT", "ALBUMIN" in the last 168 hours. No results for input(s): "LIPASE", "AMYLASE" in the last 168 hours. No results for input(s): "AMMONIA" in the last 168 hours. CBC: Recent Labs  Lab 12/24/23 0703 12/25/23 0432  WBC 5.4 4.0  NEUTROABS 3.1  --   HGB 9.8* 8.9*  HCT 29.8* 27.8*  MCV 93.4 96.2  PLT 231 215   Cardiac Enzymes: No results for input(s): "CKTOTAL", "CKMB", "CKMBINDEX", "TROPONINI" in the last 168 hours. BNP: Invalid input(s): "POCBNP" CBG: Recent Labs  Lab 12/28/23 0812 12/28/23 1156 12/28/23 1619 12/28/23 2011 12/29/23 0733  GLUCAP 105* 160* 241* 186* 121*   D-Dimer No results for input(s): "DDIMER" in the last 72 hours. Hgb A1c Recent Labs    12/28/23 1429  HGBA1C 6.7*   Lipid Profile No results for input(s): "CHOL", "HDL", "LDLCALC", "TRIG", "CHOLHDL", "LDLDIRECT" in the last 72 hours. Thyroid  function studies No results for input(s): "TSH", "T4TOTAL", "T3FREE", "THYROIDAB" in the last 72 hours.  Invalid input(s): "FREET3" Anemia work up No results for input(s): "VITAMINB12", "FOLATE", "FERRITIN", "TIBC", "IRON ", "RETICCTPCT" in the last 72 hours. Urinalysis    Component Value Date/Time   COLORURINE YELLOW 04/30/2021 2118   APPEARANCEUR CLEAR 04/30/2021 2118   LABSPEC 1.023 04/30/2021 2118   PHURINE 5.0 04/30/2021 2118   GLUCOSEU NEGATIVE 04/30/2021 2118   HGBUR NEGATIVE 04/30/2021 2118   BILIRUBINUR 1+ 06/07/2021 1516   KETONESUR TRACE (A) 04/30/2021 2118   PROTEINUR Negative  01/08/2023 1448   PROTEINUR TRACE (A) 04/30/2021 2118   UROBILINOGEN 0.2 01/08/2023 1448   UROBILINOGEN 0.2 04/30/2021 1343   NITRITE neg 06/07/2021 1516   NITRITE NEGATIVE 04/30/2021 2118   LEUKOCYTESUR Moderate (2+) (A) 01/08/2023 1448   LEUKOCYTESUR NEGATIVE 04/30/2021 2118   Sepsis Labs Recent Labs  Lab 12/24/23 0703 12/25/23 0432  WBC 5.4 4.0   Microbiology Recent Results (from the past 240 hours)  Resp panel by RT-PCR (RSV, Flu A&B, Covid) Anterior Nasal Swab     Status: None   Collection Time: 12/24/23  6:26 AM   Specimen: Anterior Nasal Swab  Result Value Ref Range Status   SARS Coronavirus 2 by RT PCR NEGATIVE NEGATIVE Final    Comment: (NOTE) SARS-CoV-2 target nucleic acids are NOT DETECTED.  The SARS-CoV-2 RNA is generally detectable in upper respiratory specimens during the acute phase of infection. The lowest concentration of SARS-CoV-2 viral copies this assay can detect is 138 copies/mL. A negative result does not preclude SARS-Cov-2 infection and should not be used as the  sole basis for treatment or other patient management decisions. A negative result may occur with  improper specimen collection/handling, submission of specimen other than nasopharyngeal swab, presence of viral mutation(s) within the areas targeted by this assay, and inadequate number of viral copies(<138 copies/mL). A negative result must be combined with clinical observations, patient history, and epidemiological information. The expected result is Negative.  Fact Sheet for Patients:  BloggerCourse.com  Fact Sheet for Healthcare Providers:  SeriousBroker.it  This test is no t yet approved or cleared by the [place]  FDA and  has been authorized for detection and/or diagnosis of SARS-CoV-2 by FDA under an Emergency Use Authorization (EUA). This EUA will remain  in effect (meaning this test can be used) for the duration of  the COVID-19 declaration under Section 564(b)(1) of the Act, 21 U.S.C.section 360bbb-3(b)(1), unless the authorization is terminated  or revoked sooner.       Influenza A by PCR NEGATIVE NEGATIVE Final   Influenza B by PCR NEGATIVE NEGATIVE Final    Comment: (NOTE) The Xpert Xpress SARS-CoV-2/FLU/RSV plus assay is intended as an aid in the diagnosis of influenza from Nasopharyngeal swab specimens and should not be used as a sole basis for treatment. Nasal washings and aspirates are unacceptable for Xpert Xpress SARS-CoV-2/FLU/RSV testing.  Fact Sheet for Patients: BloggerCourse.com  Fact Sheet for Healthcare Providers: SeriousBroker.it  This test is not yet approved or cleared by the [place]  FDA and has been authorized for detection and/or diagnosis of SARS-CoV-2 by FDA under an Emergency Use Authorization (EUA). This EUA will remain in effect (meaning this test can be used) for the duration of the COVID-19 declaration under Section 564(b)(1) of the Act, 21 U.S.C. section 360bbb-3(b)(1), unless the authorization is terminated or revoked.     Resp Syncytial Virus by PCR NEGATIVE NEGATIVE Final    Comment: (NOTE) Fact Sheet for Patients: BloggerCourse.com  Fact Sheet for Healthcare Providers: SeriousBroker.it  This test is not yet approved or cleared by the [place]  FDA and has been authorized for detection and/or diagnosis of SARS-CoV-2 by FDA under an Emergency Use Authorization (EUA). This EUA will remain in effect (meaning this test can be used) for the duration of the COVID-19 declaration under Section 564(b)(1) of the Act, 21 U.S.C. section 360bbb-3(b)(1), unless the authorization is terminated or revoked.  Performed at [facility], [address] [place], [place] [address]   Respiratory (~20 pathogens) panel by PCR     Status:  Abnormal   Collection Time: 12/26/23  9:46 AM   Specimen: Nasopharyngeal Swab; Respiratory  Result Value Ref Range Status   Adenovirus NOT DETECTED NOT DETECTED Final   Coronavirus 229E NOT DETECTED NOT DETECTED Final    Comment: (NOTE) The Coronavirus on the Respiratory Panel, DOES NOT test for the novel  Coronavirus (2019 nCoV)    Coronavirus HKU1 NOT DETECTED NOT DETECTED Final   Coronavirus NL63 NOT DETECTED NOT DETECTED Final   Coronavirus OC43 NOT DETECTED NOT DETECTED Final   Metapneumovirus DETECTED (A) NOT DETECTED Final   Rhinovirus / Enterovirus NOT DETECTED NOT DETECTED Final   Influenza A NOT DETECTED NOT DETECTED Final   Influenza B NOT DETECTED NOT DETECTED Final   Parainfluenza Virus 1 NOT DETECTED NOT DETECTED Final   Parainfluenza Virus 2 NOT DETECTED NOT DETECTED Final   Parainfluenza Virus 3 NOT DETECTED NOT DETECTED Final   Parainfluenza Virus 4 NOT DETECTED NOT DETECTED Final   Respiratory Syncytial Virus NOT DETECTED NOT DETECTED Final  Bordetella pertussis NOT DETECTED NOT DETECTED Final   Bordetella Parapertussis NOT DETECTED NOT DETECTED Final   Chlamydophila pneumoniae NOT DETECTED NOT DETECTED Final   Mycoplasma pneumoniae NOT DETECTED NOT DETECTED Final    Comment: Performed at [facility], [address], [place], [place] [address]  Expectorated Sputum Assessment w Gram Stain, Rflx to Resp Cult     Status: None   Collection Time: 12/28/23  8:58 AM   Specimen: Expectorated Sputum  Result Value Ref Range Status   Specimen Description EXPECTORATED SPUTUM  Final   Special Requests NONE  Final   Sputum evaluation   Final    THIS SPECIMEN IS ACCEPTABLE FOR SPUTUM CULTURE Performed at [facility], [address] [place], [place] [address]    Report Status 12/29/2023 FINAL  Final    FURTHER DISCHARGE INSTRUCTIONS:   Get Medicines reviewed and adjusted: Please take all your medications with you for your next visit with your  Primary MD   Laboratory/radiological data: Please request your Primary MD to go over all hospital tests and procedure/radiological results at the follow up, please ask your Primary MD to get all Hospital records sent to his/her office.   In some cases, they will be blood work, cultures and biopsy results pending at the time of your discharge. Please request that your primary care M.D. goes through all the records of your hospital data and follows up on these results.   Also Note the following: If you experience worsening of your admission symptoms, develop shortness of breath, life threatening emergency, suicidal or homicidal thoughts you must seek medical attention immediately by calling 911 or calling your MD immediately  if symptoms less severe.   You must read complete instructions/literature along with all the possible adverse reactions/side effects for all the Medicines you take and that have been prescribed to you. Take any new Medicines after you have completely understood and accpet all the possible adverse reactions/side effects.    patient was instructed, not to drive, operate heavy machinery, perform activities at heights, swimming or participation in water activities or provide baby-sitting services while on Pain, Sleep and Anxiety Medications; until their outpatient Physician has advised to do so again. Also recommended to not to take more than prescribed Pain, Sleep and Anxiety Medications.  It is not advisable to combine anxiety, sleep and pain medications without talking with your primary care provider.     Wear Seat belts while driving.   Please note: You were cared for by a hospitalist during your hospital stay. Once you are discharged, your primary care physician will handle any further medical issues. Please note that NO REFILLS for any discharge medications will be authorized once you are discharged, as it is imperative that you return to your primary care physician (or  establish a relationship with a primary care physician if you do not have one) for your post hospital discharge needs so that they can reassess your need for medications and monitor your lab values  Time coordinating discharge: Over 30 minutes  SIGNED:   Modena Andes, MD  [facility] 12/29/2023, 11:11 AM *Please note that this is a verbal dictation therefore any spelling or grammatical errors are due to the "Dragon Medical One" system interpretation. If 7PM-7AM, please contact night-coverage [email or web address]

## 2023-12-29 NOTE — Progress Notes (Signed)
 TOC meds in  a secure bag delivered to patient in room by this RN - pt's ride enroute

## 2023-12-30 ENCOUNTER — Telehealth: Payer: Self-pay | Admitting: *Deleted

## 2023-12-30 NOTE — Transitions of Care (Post Inpatient/ED Visit) (Signed)
   12/30/2023  Name: Tracy [name] MRN: [id number] DOB: 1945/12/11  Today's TOC FU Call Status: Today's TOC FU Call Status:: Unsuccessful Call (1st Attempt) Unsuccessful Call (1st Attempt) Date: 12/30/23  Attempted to reach the patient regarding the most recent Inpatient visit.  Left HIPAA compliant voice messages x 2- on both numbers listed for patient, requesting call back   Follow Up Plan: Additional outreach attempts will be made to reach the patient to complete the Transitions of Care (Post Inpatient visit) call.   Pls call/ message for questions,  Hawken Bielby Mckinney Javad Salva, RN, BSN, CCRN Alumnus RN Care Manager  Transitions of Care  [facility] [phone]: direct office

## 2023-12-31 ENCOUNTER — Ambulatory Visit: Admitting: Family Medicine

## 2023-12-31 ENCOUNTER — Encounter: Payer: Self-pay | Admitting: Family Medicine

## 2023-12-31 ENCOUNTER — Ambulatory Visit: Payer: Self-pay | Admitting: Family Medicine

## 2023-12-31 ENCOUNTER — Telehealth: Payer: Self-pay | Admitting: *Deleted

## 2023-12-31 VITALS — BP 148/62 | HR 103 | Temp 98.0°F | Ht 60.0 in | Wt 190.0 lb

## 2023-12-31 DIAGNOSIS — J441 Chronic obstructive pulmonary disease with (acute) exacerbation: Secondary | ICD-10-CM | POA: Diagnosis not present

## 2023-12-31 LAB — CULTURE, RESPIRATORY W GRAM STAIN

## 2023-12-31 MED ORDER — CIPROFLOXACIN HCL 500 MG PO TABS: 500.0000 mg | ORAL_TABLET | Freq: Two times a day (BID) | ORAL | 0 refills | Status: AC

## 2023-12-31 MED ORDER — PREDNISONE 10 MG PO TABS: ORAL_TABLET | ORAL | 0 refills | Status: DC

## 2023-12-31 MED ORDER — ALBUTEROL SULFATE (2.5 MG/3ML) 0.083% IN NEBU: 2.5000 mg | INHALATION_SOLUTION | RESPIRATORY_TRACT | 1 refills | Status: AC | PRN

## 2023-12-31 MED ORDER — AZITHROMYCIN 250 MG PO TABS: ORAL_TABLET | ORAL | 0 refills | Status: DC

## 2023-12-31 NOTE — Patient Instructions (Signed)
 Follow up on Monday for cough/wheezing (sooner if needed) START the Prednisone  as directed- 3 pills at the same time x3 days, then 2 pills at the same time x3 days, then 1 pill daily.  Take w/ food  TAKE the Azithromycin  as directed- 2 pills today and then 1 pill daily USE the nebulizer machine when you are home and having shortness of breath or wheezing (we are working on having this delivered) USE the rescue inhaler when you are out and about and don't have access to your machine  CONTINUE the Spiriva Respimat every day as directed ADD Mucinex  DM- available over the counter We will call you to schedule an appt with the Pulmonary specialist (lung doctor) Call with any questions or concerns Hang in there!!!

## 2023-12-31 NOTE — Progress Notes (Signed)
 [facility] Follow up:   Date of Service: 01/02/2024  Tracy [name], [name] [phone] [address] [place] [address]  CC: Follow-up for continued evaluation and management of multiple myeloma  SUMMARY OF ONCOLOGIC HISTORY: Oncology History  History of right breast cancer  04/16/2000 Surgery   Left breast: Triple negative  invasive ductal cancer treated with lumpectomy, adjuvant chemotherapy, radiation , in [place] , unknown stage   06/07/2015 Mammogram   Right breast mass 6x 6 x 5 mm, right axillary lymph node with slight cortex thickening measured 5 mm    06/13/2015 Initial Diagnosis   Right breast needle biopsy: Invasive ductal carcinoma, grade 1, right axillary lymph node biopsy negative , ER 95%, PR 5%, Ki-67 10%, HER-2 negative   06/13/2015 Clinical Stage   Stage IA: T1b N0   07/07/2015 Surgery   Right lumpectomy: Invasive ductal carcinoma grade 1, 1 cm span, with low-grade DCIS, DCIS focally 0.1 cm to inferior margin, 0/3 lymph nodes negative, ER 95%, PR 5%, HER-2 negative ratio 1.1, Ki-67 10%   07/07/2015 Pathologic Stage   Stage IA: T1c N0   07/13/2015 Procedure   Breast High/Moderate Risk Panel reveals no clinically significant variant at ATM, BRCA1, BRCA2, CDH1, CHEK2, PALB2, PTEN, and TP53.     08/23/2015 - 09/21/2015 Radiation Therapy   Adjuvant Radiation: Right breast/ 42.5Gy at 2.5 Gy per fraction x 17 fractions.   Right breast boost/ 7.5 Gy at 2.5 Gy per fraction x 3 fractions    Anti-estrogen oral therapy   Patient refused antiestrogen therapy   10/20/2015 Survivorship   Survivorship care plan completed and mailed to patient in lieu of in person visit at her request   Iron  deficiency anemia  Multiple myeloma (HCC)  01/25/2020 Initial Diagnosis   Multiple myeloma (HCC)   02/11/2020 - 05/19/2020 Adjuvant Chemotherapy   Daratumumab  Weekly    02/29/2020 - 03/13/2020 Radiation Therapy   The targets were treated to a total dose of 20 Gy in 10  fractions of 2 Gy each to the left hip and L5 using one plan.   06/02/2020 - 10/05/2020 Adjuvant Chemotherapy   Daratumumab  and Carfilzomib    10/19/2020 -  Adjuvant Chemotherapy   Maintenance Daratumumab --initially every 2 weeks, then every 4 weeks.    05/24/2022 -  Chemotherapy   Patient is on Treatment Plan : MYELOMA Daratumumab  IV q28d     Malignant neoplasm metastatic to bone (HCC)  02/08/2021 Initial Diagnosis   Bone metastases (HCC)   05/24/2022 -  Chemotherapy   Patient is on Treatment Plan : MYELOMA Daratumumab  IV q28d       CURRENT THERAPY: Daratumumab /B12 injection   INTERVAL HISTORY:  Tracy [name] is a 78 y.o. female here for continued evaluation and management of multiple myeloma.   Patient was last seen by me on 11/05/2023 and reported fluctuating receding gum issue, sore throat, and nausea with water intake.   She presented to the ED on 12/24/2023 or COPD exacerbation from metapneumovirus.   She presents in a wheelchair during today's visit.   She reports that during her hospitalization, she was treated with inhaler and nebulizer, but still had breathing difficulty. Patient notes that upon discharge, she was still unable to walk short distances due to SOB. She also reports that she was unable to lay down.   Patient reports that she is feeling much better from a COPD standpoint. She reports having a mild cough with very mild phlegm production and notes that  she is fairly close to her baseline at this time.   She denies having any SOB unless she has cough issues.  She is able to lay down and sleep without any issues at this time.   Patient reports that she was seen by a pulmonary doctor years ago and was not found to have emphysema.   Her previous treatment of Aredia  and Daratumumab  was noted to be on May 9th.   Patient reports some issues with her feel and notes that she is needing to connect with Dr. Paulla Bossier for management of her Gabapentin .   Patient denies any  black stools, blood in stools, fever, chills, night sweats, or abdominal pain.   She reports that she is drinking more water, but notes that her water-intake is still not adequate.   She notes that she will be attending her granddaughter's wedding on 01/23/2024 in [place] .   Patient Active Problem List   Diagnosis Date Noted   COPD exacerbation (HCC) 12/26/2023   Acute bronchitis due to human metapneumovirus 12/26/2023   COPD with acute exacerbation (HCC) 12/24/2023   Syncope 07/13/2023   Syncope and collapse 07/13/2023   RLS (restless legs syndrome) 06/12/2022   Malignant neoplasm metastatic to bone (HCC) 02/08/2021   Angiodysplasia of intestine 08/23/2020   Port-A-Cath in place 08/04/2020   Counseling regarding advance care planning and goals of care 02/07/2020   Multiple myeloma (HCC) 01/25/2020   Dizziness 10/12/2019   Post-nasal drainage 10/12/2019   Allergic rhinitis 08/19/2018   Type 2 diabetes mellitus with diabetic neuropathy, unspecified (HCC) 11/05/2017   Encounter for long-term use of muscle relaxants 09/24/2017   CAD in native artery 09/24/2017   OSA (obstructive sleep apnea) 09/24/2017   Snorings 09/24/2017   Asthenia 06/30/2017   Morbid obesity (HCC) 06/02/2017   Depression 06/02/2017   Iron  deficiency anemia 09/23/2016   Anemia of chronic disease 09/28/2015   Genetic testing 08/21/2015   History of left breast cancer 07/13/2015   History of right breast cancer 06/20/2015   Hearing loss due to cerumen impaction 12/29/2014   Allergy to adhesive tape 05/19/2014   Hyperlipidemia 12/06/2013   GERD (gastroesophageal reflux disease) 12/06/2013   Cervical disc disease 11/11/2013   Osteopenia 05/25/2013   Allergic asthma 12/24/2012   Insomnia 04/02/2012   Physical exam, annual 04/02/2012   HTN (hypertension) 02/03/2012   Vertigo, benign positional 02/03/2012   Left groin pain 02/03/2012   Hip pain 10/10/2011   TIA (transient ischemic attack) 07/24/2011    Allergic reaction 07/05/2011   Osteoarthrosis involving lower leg 10/19/2010   Disorder of bone and cartilage 05/01/2010   Generalized anxiety disorder 05/01/2010   Vitamin D  deficiency 02/20/2010   Unspecified chronic bronchitis (HCC) 08/24/2009   Other lymphedema 10/29/2007    is allergic to bacitracin-neomycin-polymyxin  [neomycin-bacitracin zn-polymyx], latex, nsaids, tape, ambien [zolpidem tartrate], amoxicillin, clavulanic acid, contrast media [iodinated contrast media], and tessalon  [benzonatate ].  MEDICAL HISTORY: Past Medical History:  Diagnosis Date   Angiodysplasia of intestine 08/23/2020   Anxiety    Arthritis    Breast cancer (HCC) 06/13/2015   CAD (coronary artery disease)    Cancer (HCC) 2000   breast cancer   Chronic bronchitis (HCC)    Chronic bronchitis (HCC)    Hyperlipidemia    Hypertension    Myocardial infarction [facility]) 2001   Personal history of radiation therapy    Restless leg    Stroke (HCC) 2004   TIA, no deficits    SURGICAL HISTORY: Past Surgical History:  Procedure Laterality Date   ABDOMINAL HYSTERECTOMY  1985   BREAST LUMPECTOMY Left 2000   radiation and chemo   BREAST LUMPECTOMY Right 2016   radiation   BREAST SURGERY  2001   lt breast lumpectomy   COLONOSCOPY WITH ESOPHAGOGASTRODUODENOSCOPY (EGD)  04/2020   ENTEROSCOPY N/A 03/15/2021   Procedure: ENTEROSCOPY;  Surgeon: Janel Medford, [name];  Location: [facility];  Service: Endoscopy;  Laterality: N/A;   GIVENS CAPSULE STUDY  07/2020   HOT HEMOSTASIS N/A 03/15/2021   Procedure: HOT HEMOSTASIS (ARGON PLASMA COAGULATION/BICAP);  Surgeon: Janel Medford, [name];  Location: [facility];  Service: Endoscopy;  Laterality: N/A;   IR IMAGING GUIDED PORT INSERTION  04/25/2020   RADIOACTIVE SEED GUIDED PARTIAL MASTECTOMY WITH AXILLARY SENTINEL LYMPH NODE BIOPSY Right 07/07/2015   Procedure: RIGHT RADIOACTIVE SEED GUIDED PARTIAL MASTECTOMY WITH AXILLARY SENTINEL LYMPH NODE BIOPSY;  Surgeon: Lillette Reid III, [name];  Location: [facility];  Service: General;  Laterality: Right;   SMALL INTESTINE SURGERY     TUBAL LIGATION     UPPER GASTROINTESTINAL ENDOSCOPY      SOCIAL HISTORY: Social History   Socioeconomic History   Marital status: Divorced    Spouse name: Not on file   Number of children: 7   Years of education: Not on file   Highest education level: Not on file  Occupational History   Occupation: retired  Tobacco Use   Smoking status: Former    Current packs/day: 0.00    Average packs/day: 1 pack/day for 20.0 years (20.0 ttl pk-yrs)    Types: Cigarettes    Start date: 07/30/1991    Quit date: 07/30/2011    Years since quitting: 12.4   Smokeless tobacco: Never   Tobacco comments:    Quit >4 years ago; 1 ppd for about 5/20 years (remaining was less)  Vaping Use   Vaping status: Former  Substance and Sexual Activity   Alcohol use: No    Alcohol/week: 0.0 standard drinks of alcohol   Drug use: No   Sexual activity: Not Currently  Other Topics Concern   Not on file  Social History Narrative   Lives alone.  Retired.  Education:  11th grade GED.  Children:  7 (one here).    Social Drivers of Corporate investment banker Strain: High Risk (12/26/2022)   Overall Financial Resource Strain (CARDIA)    Difficulty of Paying Living Expenses: Hard  Food Insecurity: No Food Insecurity (01/01/2024)   Hunger Vital Sign    Worried About Running Out of Food in the Last Year: Never true    Ran Out of Food in the Last Year: Never true  Recent Concern: Food Insecurity - Food Insecurity Present (12/24/2023)   Hunger Vital Sign    Worried About Running Out of Food in the Last Year: Never true    Ran Out of Food in the Last Year: Sometimes true  Transportation Needs: No Transportation Needs (01/01/2024)   PRAPARE - Administrator, Civil Service (Medical): No    Lack of Transportation (Non-Medical): No  Physical Activity: Inactive (12/26/2022)   Exercise Vital Sign     Days of Exercise per Week: 0 days    Minutes of Exercise per Session: 0 min  Stress: Stress Concern Present (12/26/2022)   [employer] of Occupational Health - Occupational Stress Questionnaire    Feeling of Stress : To some extent  Social Connections: Moderately Isolated (12/24/2023)   Social Connection and Isolation Panel  Frequency of Communication with Friends and Family: More than three times a week    Frequency of Social Gatherings with Friends and Family: Not on file    Attends Religious Services: More than 4 times per year    Active Member of [employer] or Organizations: No    Attends Banker Meetings: Never    Marital Status: Divorced  Catering manager Violence: Not At Risk (01/01/2024)   Humiliation, Afraid, Rape, and Kick questionnaire    Fear of Current or Ex-Partner: No    Emotionally Abused: No    Physically Abused: No    Sexually Abused: No    FAMILY HISTORY: Family History  Problem Relation Age of Onset   Emphysema Mother 7       smoker   Diabetes Father    Lung cancer Sister        dx. <50; former smoker   Diabetes Brother    Diabetes Brother    Brain cancer Brother 31       unknown tumor type   Diabetes Paternal Aunt    Stroke Maternal Grandmother    Diabetes Paternal Grandmother    Cancer Daughter 45       neck cancer   Other Daughter        hysterectomy for unspecified reason   Colon cancer Daughter    Breast cancer Cousin    Cancer Cousin        unspecified type   Breast cancer Other        triple negative breast cancer in her 86s   Colon polyps Neg Hx    Esophageal cancer Neg Hx    Gallbladder disease Neg Hx    ROS  10 Point review of Systems was done is negative except as noted above.   PHYSICAL EXAMINATION  ECOG PERFORMANCE STATUS: 1 - Symptomatic but completely ambulatory .BP (!) 140/54 (BP Location: Left Arm, Patient Position: Sitting)   Pulse 63   Temp (!) 97.3 F (36.3 C) (Temporal)   Resp 19   Wt 187 lb 8 oz  (85 kg)   SpO2 97%   BMI 36.62 kg/m    GENERAL:alert, in no acute distress and comfortable SKIN: no acute rashes, no significant lesions EYES: conjunctiva are pink and non-injected, sclera anicteric OROPHARYNX: MMM, no exudates, no oropharyngeal erythema or ulceration NECK: supple, no JVD LYMPH:  no palpable lymphadenopathy in the cervical, axillary or inguinal regions LUNGS: clear to auscultation b/l with normal respiratory effort HEART: regular rate & rhythm ABDOMEN:  normoactive bowel sounds , non tender, not distended. Extremity: no pedal edema PSYCH: alert & oriented x 3 with fluent speech NEURO: no focal motor/sensory deficits   LABORATORY DATA: .    Latest Ref Rng & Units 01/09/2024   11:59 AM 01/02/2024   10:52 AM 12/25/2023    4:32 AM  CBC  WBC 4.0 - 10.5 K/uL 7.0  7.2  4.0   Hemoglobin 12.0 - 15.0 g/dL 9.6  8.4  8.9   Hematocrit 36.0 - 46.0 % 28.9  25.0  27.8   Platelets 150 - 400 K/uL 335  321  215    .    Latest Ref Rng & Units 01/09/2024   11:59 AM 01/02/2024   10:52 AM 12/25/2023    4:32 AM  CMP  Glucose 70 - 99 mg/dL 601  093  235   BUN 8 - 23 mg/dL 15  15  14    Creatinine 0.44 - 1.00 mg/dL 5.73  2.20  1.20   Sodium 135 - 145 mmol/L 143  142  137   Potassium 3.5 - 5.1 mmol/L 4.0  3.5  3.7   Chloride 98 - 111 mmol/L 107  107  106   CO2 22 - 32 mmol/L 28  25  19    Calcium  8.9 - 10.3 mg/dL 9.1  8.8  8.6   Total Protein 6.5 - 8.1 g/dL 6.0  6.3    Total Bilirubin 0.0 - 1.2 mg/dL 0.3  0.4    Alkaline Phos 38 - 126 U/L 52  56    AST 15 - 41 U/L 10  23    ALT 0 - 44 U/L 14  16      Mammogram 09/23/2022:   ASSESSMENT and THERAPY PLAN:   78 yo female here for follow-up of her multiple myeloma   1)  IgG Kappa Multiple myeloma with bone lesions, anemia, renal insuff. M spike @ 3.7g/dl on diagnosis. 1p deletion, polymorphic variant, 13q deletion Multiple myeloma panel from 06/28/2021 with no M spike.  IFE positive for IgG kappa monoclonal protein possibly from  her daratumumab . Currently on monthly daratumumab  2) h/o DM2 3) Diabetic Neuropathy 4) CKD - likely from DM2, but could have an element of myeloma kidney. 5) h/o TIA and AMI 6) Iron  deficiency 7) B12 deficiency   PLAN:  -Discussed lab results on 01/02/2024 in detail with patient. CBC showed WBC of 7.2K, hemoglobin of 8.4, and platelets of 321K. -patient is mildly more anemic -discussed that her anemia could be from her recent infection. Discussed that infections can temporarily slow the bone marrow -patient is noted to have been iron  deficient in the past and there would be a need to check her iron  levels at some point -myeloma panel from 1 month ago shows M protein of 0.1 g/dL. -discussed that her monoclonal protein detection is most likely due to daratumumab  since daratumumab  is an IgG kappa monoclonal antibody -discussed that chronic bronchitis may be more prominent with COPD in her case, though there would be a need for lung function testing when she is not sick to know this -discussed that generally with an acute infection, we would recommend holding treatment for a few weeks to allow patient to recover so that treatment does not suppress the immune system.  -discussed that her myeloma has been stable and the risk of flaring an infection with treatment is higher than flaring myeloma by delaying treatment for two weeks -patient is inclined to proceed with treatment at this time  -her lungs sound okay based on physical examination -we will proceed with treatment at this time -continue daratumumab  once a month -continue Aredia  -continue to stay well-hydrated -answered all of patient's questions in detail  Follow-up: Per integrated scheduling for monthly Daratumumab  [name] visits every 2 months  The total time spent in the appointment was 30 minutes* .  All of the patient's questions were answered with apparent satisfaction. The patient knows to call the clinic with any problems,  questions or concerns.   Jacquelyn Matt MD MS AAHIVMS [facility] Hematology/Oncology Physician [facility]  .*Total Encounter Time as defined by the Centers for Medicare and Medicaid Services includes, in addition to the face-to-face time of a patient visit (documented in the note above) non-face-to-face time: obtaining and reviewing outside history, ordering and reviewing medications, tests or procedures, care coordination (communications with other health care professionals or caregivers) and documentation in the medical record.    Tracy [name],Tracy [name],acting as a Neurosurgeon for  Jacquelyn Matt, [name].,have documented all relevant documentation on the behalf of Jacquelyn Matt, [name],as directed by  Jacquelyn Matt, [name] while in the presence of Jacquelyn Matt, [name].  .Tracy [name] have reviewed the above documentation for accuracy and completeness, and Tracy [name] agree with the above. .Tracy [name]

## 2023-12-31 NOTE — Progress Notes (Signed)
   Subjective:    Patient ID: Tracy [name], female    DOB: 10-19-1945, 78 y.o.   MRN: [id number]  HPI Hospital follow up- pt was admitted 5/28-6/2 w/ COPD exacerbation.  Respiratory panel showed + Metapneumovirus.  Tx'd w/ abx, steroids, bronchodilators.  She completed 3 days of Azithro while in the hospital and d/c summary indicated she needed 2 additional days of medication but no prescription was sent.  At time of d/c, she was only short of breath when coughing and only wheezing when coughing.  She was started on Respimat BID and albuterol  prn.  She had 4-5 days of prednisone  and this was d/c'd upon discharge.  Today pt reports not feeling any better.  Continues to have cough, SOB, and now green nasal drainage.     Review of Systems For ROS see HPI     Objective:   Physical Exam Vitals reviewed.  Constitutional:      General: She is in acute distress.     Appearance: She is ill-appearing.  HENT:     Head: Normocephalic and atraumatic.     Right Ear: Tympanic membrane and ear canal normal.     Left Ear: Tympanic membrane and ear canal normal.     Nose: Congestion present.     Mouth/Throat:     Mouth: Mucous membranes are moist.     Pharynx: No oropharyngeal exudate or posterior oropharyngeal erythema.   Cardiovascular:     Rate and Rhythm: Regular rhythm. Tachycardia present.  Pulmonary:     Effort: Respiratory distress present.     Breath sounds: Wheezing and rhonchi present.     Comments: + deep, wet, hacking cough  Skin:    General: Skin is warm and dry.   Neurological:     General: No focal deficit present.     Mental Status: She is alert and oriented to person, place, and time.   Psychiatric:        Mood and Affect: Mood normal.        Behavior: Behavior normal.        Thought Content: Thought content normal.           Assessment & Plan:

## 2023-12-31 NOTE — Transitions of Care (Post Inpatient/ED Visit) (Signed)
   12/31/2023  Name: Tracy [name] MRN: [id number] DOB: 10/17/1945  Today's TOC FU Call Status: Today's TOC FU Call Status:: Unsuccessful Call (2nd Attempt) Unsuccessful Call (2nd Attempt) Date: 12/31/23  Attempted to reach the patient regarding the most recent Inpatient visit.  Left HIPAA compliant voice message requesting call back  Follow Up Plan: Additional outreach attempts will be made to reach the patient to complete the Transitions of Care (Post Inpatient/ED visit) call.   Pls call/ message for questions,  Alajah Witman Mckinney Malyk Girouard, RN, BSN, CCRN Alumnus RN Care Manager  Transitions of Care  [facility] - [facility] [phone]: direct office

## 2024-01-01 ENCOUNTER — Telehealth: Payer: Self-pay | Admitting: *Deleted

## 2024-01-01 ENCOUNTER — Other Ambulatory Visit: Payer: Self-pay | Admitting: Family Medicine

## 2024-01-01 NOTE — Transitions of Care (Post Inpatient/ED Visit) (Signed)
 01/01/2024  Name: Tracy [name] MRN: [id number] DOB: 12-18-1945  Today's TOC FU Call Status: Today's TOC FU Call Status:: Successful TOC FU Call Completed TOC FU Call Complete Date: 01/01/24 Patient's Name and Date of Birth confirmed.  Transition Care Management Follow-up Telephone Call Date of Discharge: 12/29/23 Discharge Facility: [facility]) Type of Discharge: Inpatient Admission Primary Inpatient Discharge Diagnosis:: COPD exacerbation secondary to pneumonia How have you been since you were released from the hospital?: Better ("I am fine now that I saw Dr. Paulla Bossier immediately after the hospital visit- the hospital really didn't do one thing tomake me feel better, but as soon as I started following Dr. Darilyn Edin orders, I started feeling better. I am much better now") Any questions or concerns?: No  Items Reviewed: Did you receive and understand the discharge instructions provided?: Yes (briefly reviewed with patient who verbalizes good understanding of same - declines detailed review: states has already had PCP hospital follow up visit- reviewed PCP AVS with patient briefly) Medications obtained,verified, and reconciled?: No (confirmed patient obtained/ is taking all newly Rx'd medications as instructed; self-manages medications and denies questions/ concerns around medications today) Medications Not Reviewed Reasons:: Other: (Patient declined medication review: states she had hospital follow up visit with Dr. Paulla Bossier 12/31/23 and reviewed all medications at that time:  does not want to re-review today, states "no need") Any new allergies since your discharge?: No Dietary orders reviewed?: Yes Type of Diet Ordered:: "Pretty much regular" Do you have support at home?: Yes People in Home [RPT]: alone Name of Support/Comfort Primary Source: Reports independent in self-care activities; resides alone; supportive local granddaughter assists as/ if needed/ indicated  Medications Reviewed  Today: Medications Reviewed Today     Reviewed by Josephmichael Lisenbee M, RN (Registered Nurse) on 01/01/24 at 1518  Med List Status: <None>   Medication Order Taking? Sig Documenting Provider Last Dose Status Informant  acyclovir  (ZOVIRAX ) 400 MG tablet [id number] No Take 1 tablet (400 mg total) by mouth 2 (two) times daily. Frankie Israel, MD Taking Active Self, Pharmacy Records  albuterol  (PROVENTIL ) (2.5 MG/3ML) 0.083% nebulizer solution [id number]  Take 3 mLs (2.5 mg total) by nebulization every 4 (four) hours as needed for wheezing or shortness of breath. Jess Morita, MD  Active   albuterol  (VENTOLIN  HFA) 108 [phone] Base) MCG/ACT inhaler [id number] No Inhale 2 puffs into the lungs every 6 (six) hours as needed for wheezing or shortness of breath. Modena Andes, MD Taking Active   atorvastatin  (LIPITOR) 10 MG tablet [id number] No TAKE 1 TABLET BY MOUTH DAILY [name], Katherine E, MD Taking Active Self, Pharmacy Records  azithromycin  (ZITHROMAX ) 250 MG tablet [id number]  2 tabs on day 1, 1 tab on day 2-5 [name], Katherine E, MD  Active   ciprofloxacin (CIPRO) 500 MG tablet [id number]  Take 1 tablet (500 mg total) by mouth 2 (two) times daily for 7 days. [name], Katherine E, MD  Active   dicyclomine  (BENTYL ) 10 MG capsule [id number] No Take 1 capsule (10 mg total) by mouth every 6 (six) hours.  Patient taking differently: Take 10 mg by mouth 2 (two) times daily as needed for spasms.   Elois Hair, MD Taking Active Self, Pharmacy Records  gabapentin  (NEURONTIN ) 300 MG capsule [id number] No Take 2 capsules (600 mg total) by mouth 3 (three) times daily. [name], Katherine E, MD Taking Active Self, Pharmacy Records  glucose blood (ACCU-CHEK GUIDE) test strip [id number] No Use as instructed to check sugars 1-2 times daily.  [name], Katherine E, MD Taking Active Self, Pharmacy Records  lidocaine  (LIDODERM ) 5 % [id number] No Place 1 patch onto the skin daily. Remove & Discard patch within 12 hours or as  directed by MD  Patient taking differently: Place 1 patch onto the skin 2 (two) times daily as needed (for pain).   Frankie Israel, MD Taking Active Self, Pharmacy Records  lidocaine -prilocaine  (EMLA ) cream [id number] No APPLY TOPICALLY TO PORT SITE 1  HOUR PRIOR TO ACCESS AS NEEDED Frankie Israel, MD Taking Active Self, Pharmacy Records  meclizine  (ANTIVERT ) 25 MG tablet [id number] No Take 1 tablet (25 mg total) by mouth 3 (three) times daily as needed for dizziness.  Patient taking differently: Take 25 mg by mouth as needed for dizziness or nausea.   Percival Brace, NP Taking Active Self, Pharmacy Records           Med Note (CRUTHIS, Dayla Eva   Wed Dec 24, 2023 10:07 AM)    metFORMIN  (GLUCOPHAGE ) 500 MG tablet [id number] No TAKE 1 TABLET BY MOUTH TWICE  DAILY WITH MEALS [name], Katherine E, MD Taking Active Self, Pharmacy Records  ondansetron  (ZOFRAN ) 8 MG tablet [id number] No Take 1 tablet (8 mg total) by mouth every 8 (eight) hours as needed for nausea or vomiting. Thayil, Irene T, PA-C Taking Active Self, Pharmacy Records  pantoprazole  (PROTONIX ) 40 MG tablet [id number] No TAKE 1 TABLET BY MOUTH TWICE  DAILY [name], Katherine E, MD Taking Active Self, Pharmacy Records  predniSONE  (DELTASONE ) 10 MG tablet [id number]  3 tabs x3 days and then 2 tabs x3 days and then 1 tab x3 days.  Take w/ food. [name], Katherine E, MD  Active   Tiotropium Bromide Monohydrate (SPIRIVA RESPIMAT) 2.5 MCG/ACT AERS [id number] No Inhale 2 puffs into the lungs 2 (two) times daily. Modena Andes, MD Taking Active   tiZANidine  (ZANAFLEX ) 4 MG tablet [id number] No TAKE 1 TABLET BY MOUTH AT  BEDTIME  Patient taking differently: Take 4 mg by mouth daily as needed for muscle spasms.   Jess Morita, MD Taking Active Self, Pharmacy Records  traZODone  (DESYREL ) 100 MG tablet [id number] No TAKE 1 TABLET BY MOUTH AT  BEDTIME [name], Katherine E, MD Taking Active Self, Pharmacy Records  Vitamin D ,  Ergocalciferol , (DRISDOL ) 1.25 MG (50000 UNIT) CAPS capsule [id number] No TAKE 1 CAPSULE BY MOUTH ONCE  WEEKLY Frankie Israel, MD Taking Active Self, Pharmacy Records           Home Care and Equipment/Supplies: Were Home Health Services Ordered?: No (Reports "was offered, but I declined- I don't need that") Any new equipment or medical supplies ordered?: No  Functional Questionnaire: Do you need assistance with bathing/showering or dressing?: No Do you need assistance with meal preparation?: No Do you need assistance with eating?: No Do you have difficulty maintaining continence: No Do you need assistance with getting out of bed/getting out of a chair/moving?: No Do you have difficulty managing or taking your medications?: No  Follow up appointments reviewed: PCP Follow-up appointment confirmed?: Yes Date of PCP follow-up appointment?: 12/31/23 Follow-up Provider: PCP- reviewed office visit with patient: she verbalizes good understanding of same Specialist Hospital Follow-up appointment confirmed?: Yes Date of Specialist follow-up appointment?: 01/02/24 Follow-Up Specialty Provider:: Oncology provider Do you need transportation to your follow-up appointment?: No Do you understand care options if your condition(s) worsen?: Yes-patient verbalized understanding  SDOH Interventions Today    Flowsheet Row Most Recent Value  SDOH Interventions   Food Insecurity Interventions Intervention  Not Indicated  [During TOC call today: denies food insecurity]  Housing Interventions Intervention Not Indicated  Transportation Interventions Intervention Not Indicated  [drives self at baseline,  local granddaughter assists as indicated]  Utilities Interventions Intervention Not Indicated      Patient declines need for ongoing/ further care management/ coordination outreach; declines enrollment in 30-day TOC program- declines taking my direct phone number should needs/ concerns arise post-TOC  call   See TOC assessment tabs for additional assessment/ TOC intervention information  Pls call/ message for questions,  Kaitlyn Skowron Mckinney Royalty Domagala, RN, BSN, Media planner  Transitions of Care  [facility] [phone]: direct office

## 2024-01-01 NOTE — Telephone Encounter (Unsigned)
 Copied from CRM [id number]. Topic: Clinical - Medication Refill >> Jan 01, 2024  3:35 PM Marlan Silva wrote: Medication:  tiZANidine  (ZANAFLEX ) 4 MG tablet   Has the patient contacted their pharmacy? Yes (Agent: If no, request that the patient contact the pharmacy for the refill. If patient does not wish to contact the pharmacy document the reason why and proceed with request.) (Agent: If yes, when and what did the pharmacy advise?)   [facility] - [place], [place] - [address] [place] [address] Phone: [phone] Fax: [phone]   Is this the correct pharmacy for this prescription? Yes If no, delete pharmacy and type the correct one.   Has the prescription been filled recently? Yes  Is the patient out of the medication? Yes  Has the patient been seen for an appointment in the last year OR does the patient have an upcoming appointment? Yes  Can we respond through MyChart? Yes  Agent: Please be advised that Rx refills may take up to 3 business days. We ask that you follow-up with your pharmacy.

## 2024-01-02 ENCOUNTER — Inpatient Hospital Stay

## 2024-01-02 ENCOUNTER — Inpatient Hospital Stay: Attending: Hematology | Admitting: Hematology

## 2024-01-02 VITALS — BP 140/54 | HR 63 | Temp 97.3°F | Resp 19 | Wt 187.5 lb

## 2024-01-02 DIAGNOSIS — C9000 Multiple myeloma not having achieved remission: Secondary | ICD-10-CM | POA: Insufficient documentation

## 2024-01-02 DIAGNOSIS — G629 Polyneuropathy, unspecified: Secondary | ICD-10-CM | POA: Insufficient documentation

## 2024-01-02 DIAGNOSIS — G4733 Obstructive sleep apnea (adult) (pediatric): Secondary | ICD-10-CM | POA: Insufficient documentation

## 2024-01-02 DIAGNOSIS — Z79899 Other long term (current) drug therapy: Secondary | ICD-10-CM | POA: Insufficient documentation

## 2024-01-02 DIAGNOSIS — Z7189 Other specified counseling: Secondary | ICD-10-CM

## 2024-01-02 DIAGNOSIS — I2510 Atherosclerotic heart disease of native coronary artery without angina pectoris: Secondary | ICD-10-CM | POA: Insufficient documentation

## 2024-01-02 DIAGNOSIS — N189 Chronic kidney disease, unspecified: Secondary | ICD-10-CM | POA: Diagnosis not present

## 2024-01-02 DIAGNOSIS — C7951 Secondary malignant neoplasm of bone: Secondary | ICD-10-CM

## 2024-01-02 DIAGNOSIS — Z803 Family history of malignant neoplasm of breast: Secondary | ICD-10-CM | POA: Insufficient documentation

## 2024-01-02 DIAGNOSIS — Z5112 Encounter for antineoplastic immunotherapy: Secondary | ICD-10-CM | POA: Insufficient documentation

## 2024-01-02 DIAGNOSIS — Z90710 Acquired absence of both cervix and uterus: Secondary | ICD-10-CM | POA: Insufficient documentation

## 2024-01-02 DIAGNOSIS — Z801 Family history of malignant neoplasm of trachea, bronchus and lung: Secondary | ICD-10-CM | POA: Diagnosis not present

## 2024-01-02 DIAGNOSIS — E1140 Type 2 diabetes mellitus with diabetic neuropathy, unspecified: Secondary | ICD-10-CM | POA: Diagnosis not present

## 2024-01-02 DIAGNOSIS — Z87891 Personal history of nicotine dependence: Secondary | ICD-10-CM | POA: Diagnosis not present

## 2024-01-02 DIAGNOSIS — J4489 Other specified chronic obstructive pulmonary disease: Secondary | ICD-10-CM | POA: Diagnosis not present

## 2024-01-02 DIAGNOSIS — Z8673 Personal history of transient ischemic attack (TIA), and cerebral infarction without residual deficits: Secondary | ICD-10-CM | POA: Insufficient documentation

## 2024-01-02 DIAGNOSIS — I252 Old myocardial infarction: Secondary | ICD-10-CM | POA: Insufficient documentation

## 2024-01-02 DIAGNOSIS — D509 Iron deficiency anemia, unspecified: Secondary | ICD-10-CM | POA: Diagnosis not present

## 2024-01-02 DIAGNOSIS — Z853 Personal history of malignant neoplasm of breast: Secondary | ICD-10-CM | POA: Diagnosis not present

## 2024-01-02 DIAGNOSIS — G2581 Restless legs syndrome: Secondary | ICD-10-CM | POA: Insufficient documentation

## 2024-01-02 DIAGNOSIS — Z800 Family history of malignant neoplasm of digestive organs: Secondary | ICD-10-CM | POA: Diagnosis not present

## 2024-01-02 DIAGNOSIS — E538 Deficiency of other specified B group vitamins: Secondary | ICD-10-CM | POA: Insufficient documentation

## 2024-01-02 DIAGNOSIS — E1122 Type 2 diabetes mellitus with diabetic chronic kidney disease: Secondary | ICD-10-CM | POA: Diagnosis not present

## 2024-01-02 DIAGNOSIS — Z923 Personal history of irradiation: Secondary | ICD-10-CM | POA: Insufficient documentation

## 2024-01-02 DIAGNOSIS — Z95828 Presence of other vascular implants and grafts: Secondary | ICD-10-CM

## 2024-01-02 DIAGNOSIS — I129 Hypertensive chronic kidney disease with stage 1 through stage 4 chronic kidney disease, or unspecified chronic kidney disease: Secondary | ICD-10-CM | POA: Diagnosis not present

## 2024-01-02 LAB — CBC WITH DIFFERENTIAL (CANCER CENTER ONLY)
Abs Immature Granulocytes: 0.03 10*3/uL (ref 0.00–0.07)
Basophils Absolute: 0 10*3/uL (ref 0.0–0.1)
Basophils Relative: 0 %
Eosinophils Absolute: 0 10*3/uL (ref 0.0–0.5)
Eosinophils Relative: 0 %
HCT: 25 % — ABNORMAL LOW (ref 36.0–46.0)
Hemoglobin: 8.4 g/dL — ABNORMAL LOW (ref 12.0–15.0)
Immature Granulocytes: 0 %
Lymphocytes Relative: 13 %
Lymphs Abs: 0.9 10*3/uL (ref 0.7–4.0)
MCH: 30.8 pg (ref 26.0–34.0)
MCHC: 33.6 g/dL (ref 30.0–36.0)
MCV: 91.6 fL (ref 80.0–100.0)
Monocytes Absolute: 0.5 10*3/uL (ref 0.1–1.0)
Monocytes Relative: 6 %
Neutro Abs: 5.8 10*3/uL (ref 1.7–7.7)
Neutrophils Relative %: 81 %
Platelet Count: 321 10*3/uL (ref 150–400)
RBC: 2.73 MIL/uL — ABNORMAL LOW (ref 3.87–5.11)
RDW: 13.9 % (ref 11.5–15.5)
WBC Count: 7.2 10*3/uL (ref 4.0–10.5)
nRBC: 0 % (ref 0.0–0.2)

## 2024-01-02 LAB — CMP (CANCER CENTER ONLY)
ALT: 16 U/L (ref 0–44)
AST: 23 U/L (ref 15–41)
Albumin: 4 g/dL (ref 3.5–5.0)
Alkaline Phosphatase: 56 U/L (ref 38–126)
Anion gap: 10 (ref 5–15)
BUN: 15 mg/dL (ref 8–23)
CO2: 25 mmol/L (ref 22–32)
Calcium: 8.8 mg/dL — ABNORMAL LOW (ref 8.9–10.3)
Chloride: 107 mmol/L (ref 98–111)
Creatinine: 1.33 mg/dL — ABNORMAL HIGH (ref 0.44–1.00)
GFR, Estimated: 41 mL/min — ABNORMAL LOW (ref >60)
Glucose, Bld: 161 mg/dL — ABNORMAL HIGH (ref 70–99)
Potassium: 3.5 mmol/L (ref 3.5–5.1)
Sodium: 142 mmol/L (ref 135–145)
Total Bilirubin: 0.4 mg/dL (ref 0.0–1.2)
Total Protein: 6.3 g/dL — ABNORMAL LOW (ref 6.5–8.1)

## 2024-01-02 MED ORDER — SODIUM CHLORIDE 0.9% FLUSH
10.0000 mL | INTRAVENOUS | Status: DC | PRN
  Administered 2024-01-02: 10 mL

## 2024-01-02 MED ORDER — CYANOCOBALAMIN 1000 MCG/ML IJ SOLN
1000.0000 ug | Freq: Once | INTRAMUSCULAR | Status: AC
  Administered 2024-01-02: 1000 ug via INTRAMUSCULAR
  Filled 2024-01-02: qty 1

## 2024-01-02 MED ORDER — SODIUM CHLORIDE 0.9 % IV SOLN: Freq: Once | INTRAVENOUS | Status: AC

## 2024-01-02 MED ORDER — SODIUM CHLORIDE 0.9% FLUSH
10.0000 mL | Freq: Once | INTRAVENOUS | Status: AC
  Administered 2024-01-02: 10 mL

## 2024-01-02 MED ORDER — HEPARIN SOD (PORK) LOCK FLUSH 100 UNIT/ML IV SOLN
500.0000 [IU] | Freq: Once | INTRAVENOUS | Status: AC | PRN
  Administered 2024-01-02: 500 [IU]

## 2024-01-02 MED ORDER — SODIUM CHLORIDE 0.9 % IV SOLN
12.0000 mg | Freq: Once | INTRAVENOUS | Status: AC
  Administered 2024-01-02: 12 mg via INTRAVENOUS
  Filled 2024-01-02: qty 1.2

## 2024-01-02 MED ORDER — TIZANIDINE HCL 4 MG PO TABS: ORAL_TABLET | ORAL | 1 refills | Status: DC

## 2024-01-02 MED ORDER — SODIUM CHLORIDE 0.9 % IV SOLN
16.0000 mg/kg | Freq: Once | INTRAVENOUS | Status: AC
  Administered 2024-01-02: 1300 mg via INTRAVENOUS
  Filled 2024-01-02: qty 5

## 2024-01-02 MED ORDER — FAMOTIDINE IN NACL 20-0.9 MG/50ML-% IV SOLN
20.0000 mg | Freq: Once | INTRAVENOUS | Status: AC
  Administered 2024-01-02: 20 mg via INTRAVENOUS
  Filled 2024-01-02: qty 50

## 2024-01-02 MED ORDER — DIPHENHYDRAMINE HCL 25 MG PO CAPS
50.0000 mg | ORAL_CAPSULE | Freq: Once | ORAL | Status: AC
  Administered 2024-01-02: 50 mg via ORAL
  Filled 2024-01-02: qty 2

## 2024-01-02 MED ORDER — ACETAMINOPHEN 500 MG PO TABS
1000.0000 mg | ORAL_TABLET | Freq: Once | ORAL | Status: AC
  Administered 2024-01-02: 1000 mg via ORAL
  Filled 2024-01-02: qty 2

## 2024-01-02 NOTE — Patient Instructions (Signed)
 [facility] - A DEPT OF [facility]  Discharge Instructions: Thank you for choosing [facility] to provide your oncology and hematology care.   If you have a lab appointment with the Cancer Center, please go directly to the Cancer Center and check in at the registration area.   Wear comfortable clothing and clothing appropriate for easy access to any Portacath or PICC line.   We strive to give you quality time with your provider. You may need to reschedule your appointment if you arrive late (15 or more minutes).  Arriving late affects you and other patients whose appointments are after yours.  Also, if you miss three or more appointments without notifying the office, you may be dismissed from the clinic at the provider's discretion.      For prescription refill requests, have your pharmacy contact our office and allow 72 hours for refills to be completed.    Today you received the following chemotherapy and/or immunotherapy agents: Daratumumab.       To help prevent nausea and vomiting after your treatment, we encourage you to take your nausea medication as directed.  BELOW ARE SYMPTOMS THAT SHOULD BE REPORTED IMMEDIATELY: *FEVER GREATER THAN 100.4 F (38 C) OR HIGHER *CHILLS OR SWEATING *NAUSEA AND VOMITING THAT IS NOT CONTROLLED WITH YOUR NAUSEA MEDICATION *UNUSUAL SHORTNESS OF BREATH *UNUSUAL BRUISING OR BLEEDING *URINARY PROBLEMS (pain or burning when urinating, or frequent urination) *BOWEL PROBLEMS (unusual diarrhea, constipation, pain near the anus) TENDERNESS IN MOUTH AND THROAT WITH OR WITHOUT PRESENCE OF ULCERS (sore throat, sores in mouth, or a toothache) UNUSUAL RASH, SWELLING OR PAIN  UNUSUAL VAGINAL DISCHARGE OR ITCHING   Items with * indicate a potential emergency and should be followed up as soon as possible or go to the Emergency Department if any problems should occur.  Please show the CHEMOTHERAPY ALERT CARD or  IMMUNOTHERAPY ALERT CARD at check-in to the Emergency Department and triage nurse.  Should you have questions after your visit or need to cancel or reschedule your appointment, please contact [facility] - A DEPT OF [facility]  Dept: [phone]  and follow the prompts.  Office hours are 8:00 a.m. to 4:30 p.m. Monday - Friday. Please note that voicemails left after 4:00 p.m. may not be returned until the following business day.  We are closed weekends and major holidays. You have access to a nurse at all times for urgent questions. Please call the main number to the clinic Dept: [phone] and follow the prompts.   For any non-urgent questions, you may also contact your provider using MyChart. We now offer e-Visits for anyone 68 and older to request care online for non-urgent symptoms. For details visit [email or web address].   Also download the MyChart app! Go to the app store, search "MyChart", open the app, select [place], and log in with your MyChart username and password.

## 2024-01-05 ENCOUNTER — Ambulatory Visit (INDEPENDENT_AMBULATORY_CARE_PROVIDER_SITE_OTHER): Admitting: Family Medicine

## 2024-01-05 ENCOUNTER — Encounter: Payer: Self-pay | Admitting: Family Medicine

## 2024-01-05 VITALS — BP 124/68 | HR 75 | Temp 98.0°F | Ht 60.0 in | Wt 187.2 lb

## 2024-01-05 DIAGNOSIS — C9000 Multiple myeloma not having achieved remission: Secondary | ICD-10-CM | POA: Diagnosis not present

## 2024-01-05 DIAGNOSIS — J151 Pneumonia due to Pseudomonas: Secondary | ICD-10-CM | POA: Diagnosis not present

## 2024-01-05 LAB — MULTIPLE MYELOMA PANEL, SERUM
Albumin SerPl Elph-Mcnc: 3.1 g/dL (ref 2.9–4.4)
Albumin/Glob SerPl: 1.2 (ref 0.7–1.7)
Alpha 1: 0.2 g/dL (ref 0.0–0.4)
Alpha2 Glob SerPl Elph-Mcnc: 1.2 g/dL — ABNORMAL HIGH (ref 0.4–1.0)
B-Globulin SerPl Elph-Mcnc: 0.9 g/dL (ref 0.7–1.3)
Gamma Glob SerPl Elph-Mcnc: 0.4 g/dL (ref 0.4–1.8)
Globulin, Total: 2.7 g/dL (ref 2.2–3.9)
IgA: 78 mg/dL (ref 64–422)
IgG (Immunoglobin G), Serum: 446 mg/dL — ABNORMAL LOW (ref 586–1602)
IgM (Immunoglobulin M), Srm: 35 mg/dL (ref 26–217)
M Protein SerPl Elph-Mcnc: 0.1 g/dL — ABNORMAL HIGH
Total Protein ELP: 5.8 g/dL — ABNORMAL LOW (ref 6.0–8.5)

## 2024-01-05 MED ORDER — LIDOCAINE 5 % EX PTCH: 1.0000 | MEDICATED_PATCH | CUTANEOUS | 1 refills | Status: AC

## 2024-01-05 NOTE — Patient Instructions (Signed)
 Follow up as needed or as scheduled Finish the Cipro  as directed Complete the Prednisone  as directed Use the nebulizer as needed for shortness of breath, wheezing, or cough Call with any questions or concerns Hang in there!!!

## 2024-01-05 NOTE — Progress Notes (Unsigned)
   Subjective:    Patient ID: Tracy [name], female    DOB: 1946-04-11, 78 y.o.   MRN: [id number]  HPI COPD exacerbation- pt reports feeling much better.  She is able to speak in complete sentences w/o coughing or wheezing.  Cough is still deep but no longer productive.  SOB is improving.  Currently on Cipro  BID.     Review of Systems For ROS see HPI     Objective:   Physical Exam Vitals reviewed.  Constitutional:      General: She is not in acute distress.    Appearance: Normal appearance. She is not ill-appearing.  HENT:     Head: Normocephalic and atraumatic.  Cardiovascular:     Rate and Rhythm: Normal rate and regular rhythm.  Pulmonary:     Effort: Pulmonary effort is normal. No respiratory distress.     Breath sounds: No wheezing.     Comments: No cough heard, much better air movement Skin:    General: Skin is warm and dry.  Neurological:     General: No focal deficit present.     Mental Status: She is alert and oriented to person, place, and time.  Psychiatric:        Mood and Affect: Mood normal.        Behavior: Behavior normal.        Thought Content: Thought content normal.           Assessment & Plan:  Pseudomonas PNA- new.  Pt was started on Cipro  after sputum cultures showed abundant pseudomonas.  Pt has been taking Prednisone , using nebulizer, and doing well on abx.  No changes at this time.  Pt appreciative of the care she received last time she was in office.

## 2024-01-08 ENCOUNTER — Other Ambulatory Visit: Payer: Self-pay | Admitting: Family Medicine

## 2024-01-08 ENCOUNTER — Other Ambulatory Visit: Payer: Self-pay

## 2024-01-08 DIAGNOSIS — C9000 Multiple myeloma not having achieved remission: Secondary | ICD-10-CM

## 2024-01-08 NOTE — Telephone Encounter (Signed)
 Requested Prescriptions   Pending Prescriptions Disp Refills   tiZANidine  (ZANAFLEX ) 4 MG tablet [Pharmacy Med Name: tiZANidine  HCl 4 MG Oral Tablet] 90 tablet 1    Sig: TAKE 1 TABLET BY MOUTH AT  BEDTIME     Date of patient request: 01/08/2024 Last office visit: 01/05/2024 Upcoming visit: Visit date not found Date of last refill: 01/02/2024 Last refill amount: 90

## 2024-01-09 ENCOUNTER — Inpatient Hospital Stay

## 2024-01-09 ENCOUNTER — Telehealth: Payer: Self-pay

## 2024-01-09 ENCOUNTER — Other Ambulatory Visit: Payer: Self-pay

## 2024-01-09 VITALS — BP 125/86 | HR 71 | Temp 97.6°F | Resp 18 | Wt 186.2 lb

## 2024-01-09 DIAGNOSIS — Z923 Personal history of irradiation: Secondary | ICD-10-CM | POA: Diagnosis not present

## 2024-01-09 DIAGNOSIS — C9000 Multiple myeloma not having achieved remission: Secondary | ICD-10-CM | POA: Diagnosis not present

## 2024-01-09 DIAGNOSIS — E1122 Type 2 diabetes mellitus with diabetic chronic kidney disease: Secondary | ICD-10-CM | POA: Diagnosis not present

## 2024-01-09 DIAGNOSIS — G2581 Restless legs syndrome: Secondary | ICD-10-CM | POA: Diagnosis not present

## 2024-01-09 DIAGNOSIS — Z87891 Personal history of nicotine dependence: Secondary | ICD-10-CM | POA: Diagnosis not present

## 2024-01-09 DIAGNOSIS — N189 Chronic kidney disease, unspecified: Secondary | ICD-10-CM | POA: Diagnosis not present

## 2024-01-09 DIAGNOSIS — E538 Deficiency of other specified B group vitamins: Secondary | ICD-10-CM | POA: Diagnosis not present

## 2024-01-09 DIAGNOSIS — Z5112 Encounter for antineoplastic immunotherapy: Secondary | ICD-10-CM | POA: Diagnosis not present

## 2024-01-09 DIAGNOSIS — C7951 Secondary malignant neoplasm of bone: Secondary | ICD-10-CM

## 2024-01-09 DIAGNOSIS — G4733 Obstructive sleep apnea (adult) (pediatric): Secondary | ICD-10-CM | POA: Diagnosis not present

## 2024-01-09 DIAGNOSIS — I2510 Atherosclerotic heart disease of native coronary artery without angina pectoris: Secondary | ICD-10-CM | POA: Diagnosis not present

## 2024-01-09 DIAGNOSIS — Z800 Family history of malignant neoplasm of digestive organs: Secondary | ICD-10-CM | POA: Diagnosis not present

## 2024-01-09 DIAGNOSIS — Z95828 Presence of other vascular implants and grafts: Secondary | ICD-10-CM

## 2024-01-09 DIAGNOSIS — D509 Iron deficiency anemia, unspecified: Secondary | ICD-10-CM | POA: Diagnosis not present

## 2024-01-09 DIAGNOSIS — Z803 Family history of malignant neoplasm of breast: Secondary | ICD-10-CM | POA: Diagnosis not present

## 2024-01-09 DIAGNOSIS — Z853 Personal history of malignant neoplasm of breast: Secondary | ICD-10-CM | POA: Diagnosis not present

## 2024-01-09 DIAGNOSIS — Z79899 Other long term (current) drug therapy: Secondary | ICD-10-CM | POA: Diagnosis not present

## 2024-01-09 DIAGNOSIS — Z801 Family history of malignant neoplasm of trachea, bronchus and lung: Secondary | ICD-10-CM | POA: Diagnosis not present

## 2024-01-09 DIAGNOSIS — J4489 Other specified chronic obstructive pulmonary disease: Secondary | ICD-10-CM | POA: Diagnosis not present

## 2024-01-09 DIAGNOSIS — G629 Polyneuropathy, unspecified: Secondary | ICD-10-CM | POA: Diagnosis not present

## 2024-01-09 DIAGNOSIS — E1140 Type 2 diabetes mellitus with diabetic neuropathy, unspecified: Secondary | ICD-10-CM | POA: Diagnosis not present

## 2024-01-09 DIAGNOSIS — Z8673 Personal history of transient ischemic attack (TIA), and cerebral infarction without residual deficits: Secondary | ICD-10-CM | POA: Diagnosis not present

## 2024-01-09 DIAGNOSIS — I252 Old myocardial infarction: Secondary | ICD-10-CM | POA: Diagnosis not present

## 2024-01-09 DIAGNOSIS — I129 Hypertensive chronic kidney disease with stage 1 through stage 4 chronic kidney disease, or unspecified chronic kidney disease: Secondary | ICD-10-CM | POA: Diagnosis not present

## 2024-01-09 LAB — CBC WITH DIFFERENTIAL (CANCER CENTER ONLY)
Abs Immature Granulocytes: 0.01 10*3/uL (ref 0.00–0.07)
Basophils Absolute: 0 10*3/uL (ref 0.0–0.1)
Basophils Relative: 0 %
Eosinophils Absolute: 0.1 10*3/uL (ref 0.0–0.5)
Eosinophils Relative: 1 %
HCT: 28.9 % — ABNORMAL LOW (ref 36.0–46.0)
Hemoglobin: 9.6 g/dL — ABNORMAL LOW (ref 12.0–15.0)
Immature Granulocytes: 0 %
Lymphocytes Relative: 40 %
Lymphs Abs: 2.8 10*3/uL (ref 0.7–4.0)
MCH: 30.9 pg (ref 26.0–34.0)
MCHC: 33.2 g/dL (ref 30.0–36.0)
MCV: 92.9 fL (ref 80.0–100.0)
Monocytes Absolute: 0.5 10*3/uL (ref 0.1–1.0)
Monocytes Relative: 7 %
Neutro Abs: 3.7 10*3/uL (ref 1.7–7.7)
Neutrophils Relative %: 52 %
Platelet Count: 335 10*3/uL (ref 150–400)
RBC: 3.11 MIL/uL — ABNORMAL LOW (ref 3.87–5.11)
RDW: 14.2 % (ref 11.5–15.5)
WBC Count: 7 10*3/uL (ref 4.0–10.5)
nRBC: 0 % (ref 0.0–0.2)

## 2024-01-09 LAB — CMP (CANCER CENTER ONLY)
ALT: 14 U/L (ref 0–44)
AST: 10 U/L — ABNORMAL LOW (ref 15–41)
Albumin: 3.8 g/dL (ref 3.5–5.0)
Alkaline Phosphatase: 52 U/L (ref 38–126)
Anion gap: 8 (ref 5–15)
BUN: 15 mg/dL (ref 8–23)
CO2: 28 mmol/L (ref 22–32)
Calcium: 9.1 mg/dL (ref 8.9–10.3)
Chloride: 107 mmol/L (ref 98–111)
Creatinine: 1.28 mg/dL — ABNORMAL HIGH (ref 0.44–1.00)
GFR, Estimated: 43 mL/min — ABNORMAL LOW (ref >60)
Glucose, Bld: 104 mg/dL — ABNORMAL HIGH (ref 70–99)
Potassium: 4 mmol/L (ref 3.5–5.1)
Sodium: 143 mmol/L (ref 135–145)
Total Bilirubin: 0.3 mg/dL (ref 0.0–1.2)
Total Protein: 6 g/dL — ABNORMAL LOW (ref 6.5–8.1)

## 2024-01-09 MED ORDER — SODIUM CHLORIDE 0.9% FLUSH
10.0000 mL | Freq: Once | INTRAVENOUS | Status: AC
  Administered 2024-01-09: 10 mL

## 2024-01-09 MED ORDER — HEPARIN SOD (PORK) LOCK FLUSH 100 UNIT/ML IV SOLN
500.0000 [IU] | Freq: Once | INTRAVENOUS | Status: AC
  Administered 2024-01-09: 500 [IU]

## 2024-01-09 MED ORDER — SODIUM CHLORIDE 0.9 % IV SOLN
60.0000 mg | Freq: Once | INTRAVENOUS | Status: AC
  Filled 2024-01-09: qty 10

## 2024-01-09 NOTE — Telephone Encounter (Signed)
 Copied from CRM [id number]. Topic: General - Other >> Jan 09, 2024 11:07 AM Mesmerise C wrote: Reason for CRM: Patient had a bowel movement this morning she described it was tar like and really dark, was told to reach out to Dr. Paulla Bossier due to concerns of possibly bleeding about her bowel movement.

## 2024-01-09 NOTE — Telephone Encounter (Signed)
 LVM for patient to call back regarding her concerns and to relay Providers message.

## 2024-01-09 NOTE — Telephone Encounter (Signed)
 Thankfully your labs from today show that your hemoglobin (blood count) has actually increased from 8.4 last week --> 9.6 today.  This is good news!  Dark and tarry stools can also occur if you are on iron .  Please let me know if you are taking any iron  supplements or having iron  infusions.  I would say monitor your stool over the weekend and if it remains dark and tarry we will see you early next week.  Also, if you have any blood from your bottom, worsening shortness of breath, dizziness, or other concerns- please go to the ER

## 2024-01-09 NOTE — Patient Instructions (Signed)
 [facility] - A DEPT OF [facility]  Discharge Instructions: Thank you for choosing [facility] to provide your oncology and hematology care.   If you have a lab appointment with the Cancer Center, please go directly to the Cancer Center and check in at the registration area.   Wear comfortable clothing and clothing appropriate for easy access to any Portacath or PICC line.   We strive to give you quality time with your provider. You may need to reschedule your appointment if you arrive late (15 or more minutes).  Arriving late affects you and other patients whose appointments are after yours.  Also, if you miss three or more appointments without notifying the office, you may be dismissed from the clinic at the provider's discretion.      For prescription refill requests, have your pharmacy contact our office and allow 72 hours for refills to be completed.    Today you received the following chemotherapy and/or immunotherapy agents: Aredia       To help prevent nausea and vomiting after your treatment, we encourage you to take your nausea medication as directed.  BELOW ARE SYMPTOMS THAT SHOULD BE REPORTED IMMEDIATELY: *FEVER GREATER THAN 100.4 F (38 C) OR HIGHER *CHILLS OR SWEATING *NAUSEA AND VOMITING THAT IS NOT CONTROLLED WITH YOUR NAUSEA MEDICATION *UNUSUAL SHORTNESS OF BREATH *UNUSUAL BRUISING OR BLEEDING *URINARY PROBLEMS (pain or burning when urinating, or frequent urination) *BOWEL PROBLEMS (unusual diarrhea, constipation, pain near the anus) TENDERNESS IN MOUTH AND THROAT WITH OR WITHOUT PRESENCE OF ULCERS (sore throat, sores in mouth, or a toothache) UNUSUAL RASH, SWELLING OR PAIN  UNUSUAL VAGINAL DISCHARGE OR ITCHING   Items with * indicate a potential emergency and should be followed up as soon as possible or go to the Emergency Department if any problems should occur.  Please show the CHEMOTHERAPY ALERT CARD or IMMUNOTHERAPY  ALERT CARD at check-in to the Emergency Department and triage nurse.  Should you have questions after your visit or need to cancel or reschedule your appointment, please contact [facility] - A DEPT OF [facility]  Dept: [phone]  and follow the prompts.  Office hours are 8:00 a.m. to 4:30 p.m. Monday - Friday. Please note that voicemails left after 4:00 p.m. may not be returned until the following business day.  We are closed weekends and major holidays. You have access to a nurse at all times for urgent questions. Please call the main number to the clinic Dept: [phone] and follow the prompts.   For any non-urgent questions, you may also contact your provider using MyChart. We now offer e-Visits for anyone 53 and older to request care online for non-urgent symptoms. For details visit [email or web address].   Also download the MyChart app! Go to the app store, search MyChart, open the app, select [facility], and log in with your MyChart username and password.

## 2024-01-09 NOTE — Telephone Encounter (Signed)
 Patient states bowel movement was very dark and tar like this morning. She was told to message in to Dr.[name] with any concerns of possible bleeding.

## 2024-01-10 ENCOUNTER — Encounter: Payer: Self-pay | Admitting: Hematology

## 2024-01-12 NOTE — Telephone Encounter (Signed)
 Pt states she does take Iron  daily Pt reports since weekend she has noticed stool is lighter. Pt reports she will call us  back if any changes this week. Pt did reach out to Oncology about the same concerns

## 2024-01-12 NOTE — Telephone Encounter (Unsigned)
 Copied from CRM [id number]. Topic: General - Other >> Jan 12, 2024 10:43 AM Bambi Bonine D wrote: Reason for CRM: Pt stated that she has a missed call from the office and is returning the call. I informed the pt that Dr.[name] was calling to inform her that her hemoglobin increased and also to see if she was taking any iron  supplements or iron  infusions because that could cause dark and tarry stool. Pt stated that she is taking iron  supplements and stool has improved over the weekend. Pt stated that she will also be reaching out to her oncologist.

## 2024-01-17 ENCOUNTER — Other Ambulatory Visit: Payer: Self-pay

## 2024-01-25 NOTE — Assessment & Plan Note (Signed)
 Deteriorated.  Pt reports feeling worse than she did when she left the hospital.  She was supposed to be sent home on abx- these were not sent in.  Prednisone  was d/c'd on discharge.  In office, she is not able to speak in complete sentences w/o coughing or wheezing or becoming significantly short of breath.  She has crackles and wheezes bilaterally.  Sxs improved s/p neb tx in office.  Filled out paperwork for her to have nebulizer at home.  Prescription for albuterol  nebs sent to pharmacy.  Will start abx.  Prednisone  taper.  Reviewed flags that should prompt immediate re-eval or ER evaluation.  Pt expressed understanding and is in agreement w/ plan.

## 2024-01-25 NOTE — Addendum Note (Signed)
 Addended by: Ebelyn Bohnet E on: 01/25/2024 08:01 PM   Modules accepted: Level of Service

## 2024-01-26 ENCOUNTER — Other Ambulatory Visit: Payer: Self-pay | Admitting: Family Medicine

## 2024-01-26 DIAGNOSIS — C9000 Multiple myeloma not having achieved remission: Secondary | ICD-10-CM

## 2024-01-26 DIAGNOSIS — R42 Dizziness and giddiness: Secondary | ICD-10-CM

## 2024-01-26 MED ORDER — MECLIZINE HCL 25 MG PO TABS: 25.0000 mg | ORAL_TABLET | Freq: Three times a day (TID) | ORAL | 0 refills | Status: DC | PRN

## 2024-01-26 NOTE — Telephone Encounter (Unsigned)
 Copied from CRM [id number]. Topic: Clinical - Medication Refill >> Jan 26, 2024  9:34 AM Henretta I wrote: Medication: meclizine  (ANTIVERT ) 25 MG tablet  Has the patient contacted their pharmacy? No (Agent: If no, request that the patient contact the pharmacy for the refill. If patient does not wish to contact the pharmacy document the reason why and proceed with request.) (Agent: If yes, when and what did the pharmacy advise?)  This is the patient's preferred pharmacy:   [facility] [place], [place] - [address] [place] [address] Phone: [phone] Fax: [phone]  Is this the correct pharmacy for this prescription? Yes If no, delete pharmacy and type the correct one.   Has the prescription been filled recently? No  Is the patient out of the medication? No, patient has 2 left   Has the patient been seen for an appointment in the last year OR does the patient have an upcoming appointment? Yes  Can we respond through MyChart? No  Agent: Please be advised that Rx refills may take up to 3 business days. We ask that you follow-up with your pharmacy.

## 2024-01-26 NOTE — Telephone Encounter (Signed)
 Refill request received as PCP out of office.  Chart reviewed, noted discussion with neurology previously with 1-2 vertigo attacks per year.  Meclizine  helpful.  It appears the last prescription was sent in 2022.  Refill ordered.  To Dr. Mahlon as RICK.

## 2024-01-26 NOTE — Telephone Encounter (Signed)
 Requested Prescriptions   Pending Prescriptions Disp Refills   meclizine  (ANTIVERT ) 25 MG tablet 30 tablet 0    Sig: Take 1 tablet (25 mg total) by mouth 3 (three) times daily as needed for dizziness.     Date of patient request: 01/26/2024 Last office visit: 01/05/2024 Upcoming visit: Visit date not found Date of last refill: 03/08/2021 Last refill amount: 30   This was not prescribed by Dr Mahlon, please advise

## 2024-01-29 ENCOUNTER — Inpatient Hospital Stay: Attending: Hematology

## 2024-01-29 ENCOUNTER — Inpatient Hospital Stay

## 2024-01-29 VITALS — BP 158/67 | HR 88 | Temp 98.9°F | Resp 20 | Wt 187.8 lb

## 2024-01-29 DIAGNOSIS — Z923 Personal history of irradiation: Secondary | ICD-10-CM | POA: Diagnosis not present

## 2024-01-29 DIAGNOSIS — C9000 Multiple myeloma not having achieved remission: Secondary | ICD-10-CM | POA: Insufficient documentation

## 2024-01-29 DIAGNOSIS — Z803 Family history of malignant neoplasm of breast: Secondary | ICD-10-CM | POA: Diagnosis not present

## 2024-01-29 DIAGNOSIS — Z7189 Other specified counseling: Secondary | ICD-10-CM

## 2024-01-29 DIAGNOSIS — G4733 Obstructive sleep apnea (adult) (pediatric): Secondary | ICD-10-CM | POA: Diagnosis not present

## 2024-01-29 DIAGNOSIS — E1140 Type 2 diabetes mellitus with diabetic neuropathy, unspecified: Secondary | ICD-10-CM | POA: Diagnosis not present

## 2024-01-29 DIAGNOSIS — Z8673 Personal history of transient ischemic attack (TIA), and cerebral infarction without residual deficits: Secondary | ICD-10-CM | POA: Insufficient documentation

## 2024-01-29 DIAGNOSIS — I252 Old myocardial infarction: Secondary | ICD-10-CM | POA: Insufficient documentation

## 2024-01-29 DIAGNOSIS — I2510 Atherosclerotic heart disease of native coronary artery without angina pectoris: Secondary | ICD-10-CM | POA: Insufficient documentation

## 2024-01-29 DIAGNOSIS — Z87891 Personal history of nicotine dependence: Secondary | ICD-10-CM | POA: Insufficient documentation

## 2024-01-29 DIAGNOSIS — C7951 Secondary malignant neoplasm of bone: Secondary | ICD-10-CM

## 2024-01-29 DIAGNOSIS — D509 Iron deficiency anemia, unspecified: Secondary | ICD-10-CM | POA: Insufficient documentation

## 2024-01-29 DIAGNOSIS — G2581 Restless legs syndrome: Secondary | ICD-10-CM | POA: Diagnosis not present

## 2024-01-29 DIAGNOSIS — J4489 Other specified chronic obstructive pulmonary disease: Secondary | ICD-10-CM | POA: Insufficient documentation

## 2024-01-29 DIAGNOSIS — Z853 Personal history of malignant neoplasm of breast: Secondary | ICD-10-CM | POA: Diagnosis not present

## 2024-01-29 DIAGNOSIS — Z800 Family history of malignant neoplasm of digestive organs: Secondary | ICD-10-CM | POA: Diagnosis not present

## 2024-01-29 DIAGNOSIS — I129 Hypertensive chronic kidney disease with stage 1 through stage 4 chronic kidney disease, or unspecified chronic kidney disease: Secondary | ICD-10-CM | POA: Diagnosis not present

## 2024-01-29 DIAGNOSIS — Z5112 Encounter for antineoplastic immunotherapy: Secondary | ICD-10-CM | POA: Diagnosis not present

## 2024-01-29 DIAGNOSIS — E538 Deficiency of other specified B group vitamins: Secondary | ICD-10-CM | POA: Insufficient documentation

## 2024-01-29 DIAGNOSIS — Z90710 Acquired absence of both cervix and uterus: Secondary | ICD-10-CM | POA: Insufficient documentation

## 2024-01-29 DIAGNOSIS — Z801 Family history of malignant neoplasm of trachea, bronchus and lung: Secondary | ICD-10-CM | POA: Diagnosis not present

## 2024-01-29 DIAGNOSIS — G629 Polyneuropathy, unspecified: Secondary | ICD-10-CM | POA: Insufficient documentation

## 2024-01-29 DIAGNOSIS — N189 Chronic kidney disease, unspecified: Secondary | ICD-10-CM | POA: Insufficient documentation

## 2024-01-29 DIAGNOSIS — E1122 Type 2 diabetes mellitus with diabetic chronic kidney disease: Secondary | ICD-10-CM | POA: Diagnosis not present

## 2024-01-29 DIAGNOSIS — Z79899 Other long term (current) drug therapy: Secondary | ICD-10-CM | POA: Insufficient documentation

## 2024-01-29 LAB — CBC WITH DIFFERENTIAL (CANCER CENTER ONLY)
Abs Immature Granulocytes: 0.01 10*3/uL (ref 0.00–0.07)
Basophils Absolute: 0 10*3/uL (ref 0.0–0.1)
Basophils Relative: 1 %
Eosinophils Absolute: 0 10*3/uL (ref 0.0–0.5)
Eosinophils Relative: 1 %
HCT: 29.4 % — ABNORMAL LOW (ref 36.0–46.0)
Hemoglobin: 9.6 g/dL — ABNORMAL LOW (ref 12.0–15.0)
Immature Granulocytes: 0 %
Lymphocytes Relative: 40 %
Lymphs Abs: 2 10*3/uL (ref 0.7–4.0)
MCH: 30.3 pg (ref 26.0–34.0)
MCHC: 32.7 g/dL (ref 30.0–36.0)
MCV: 92.7 fL (ref 80.0–100.0)
Monocytes Absolute: 0.4 10*3/uL (ref 0.1–1.0)
Monocytes Relative: 8 %
Neutro Abs: 2.6 10*3/uL (ref 1.7–7.7)
Neutrophils Relative %: 50 %
Platelet Count: 370 10*3/uL (ref 150–400)
RBC: 3.17 MIL/uL — ABNORMAL LOW (ref 3.87–5.11)
RDW: 14.2 % (ref 11.5–15.5)
WBC Count: 5.1 10*3/uL (ref 4.0–10.5)
nRBC: 0 % (ref 0.0–0.2)

## 2024-01-29 LAB — CMP (CANCER CENTER ONLY)
ALT: 12 U/L (ref 0–44)
AST: 15 U/L (ref 15–41)
Albumin: 3.8 g/dL (ref 3.5–5.0)
Alkaline Phosphatase: 69 U/L (ref 38–126)
Anion gap: 8 (ref 5–15)
BUN: 16 mg/dL (ref 8–23)
CO2: 26 mmol/L (ref 22–32)
Calcium: 9.1 mg/dL (ref 8.9–10.3)
Chloride: 109 mmol/L (ref 98–111)
Creatinine: 1.34 mg/dL — ABNORMAL HIGH (ref 0.44–1.00)
GFR, Estimated: 40 mL/min — ABNORMAL LOW (ref >60)
Glucose, Bld: 127 mg/dL — ABNORMAL HIGH (ref 70–99)
Potassium: 4.2 mmol/L (ref 3.5–5.1)
Sodium: 143 mmol/L (ref 135–145)
Total Bilirubin: 0.3 mg/dL (ref 0.0–1.2)
Total Protein: 6.2 g/dL — ABNORMAL LOW (ref 6.5–8.1)

## 2024-01-29 MED ORDER — SODIUM CHLORIDE 0.9 % IV SOLN
12.0000 mg | Freq: Once | INTRAVENOUS | Status: AC
  Administered 2024-01-29: 12 mg via INTRAVENOUS
  Filled 2024-01-29: qty 1.2

## 2024-01-29 MED ORDER — CYANOCOBALAMIN 1000 MCG/ML IJ SOLN
1000.0000 ug | Freq: Once | INTRAMUSCULAR | Status: AC
  Administered 2024-01-29: 1000 ug via INTRAMUSCULAR
  Filled 2024-01-29: qty 1

## 2024-01-29 MED ORDER — ACETAMINOPHEN 500 MG PO TABS
1000.0000 mg | ORAL_TABLET | Freq: Once | ORAL | Status: AC
  Administered 2024-01-29: 1000 mg via ORAL
  Filled 2024-01-29: qty 2

## 2024-01-29 MED ORDER — HEPARIN SOD (PORK) LOCK FLUSH 100 UNIT/ML IV SOLN
500.0000 [IU] | Freq: Once | INTRAVENOUS | Status: AC | PRN
  Administered 2024-01-29: 500 [IU]

## 2024-01-29 MED ORDER — FAMOTIDINE IN NACL 20-0.9 MG/50ML-% IV SOLN
20.0000 mg | Freq: Once | INTRAVENOUS | Status: AC
  Administered 2024-01-29: 20 mg via INTRAVENOUS
  Filled 2024-01-29: qty 50

## 2024-01-29 MED ORDER — SODIUM CHLORIDE 0.9% FLUSH
10.0000 mL | INTRAVENOUS | Status: DC | PRN
  Administered 2024-01-29: 10 mL

## 2024-01-29 MED ORDER — DIPHENHYDRAMINE HCL 25 MG PO CAPS
50.0000 mg | ORAL_CAPSULE | Freq: Once | ORAL | Status: AC
  Administered 2024-01-29: 50 mg via ORAL
  Filled 2024-01-29: qty 2

## 2024-01-29 MED ORDER — SODIUM CHLORIDE 0.9 % IV SOLN: Freq: Once | INTRAVENOUS | Status: AC

## 2024-01-29 MED ORDER — SODIUM CHLORIDE 0.9 % IV SOLN
16.0000 mg/kg | Freq: Once | INTRAVENOUS | Status: AC
  Administered 2024-01-29: 1300 mg via INTRAVENOUS
  Filled 2024-01-29: qty 5

## 2024-01-29 NOTE — Patient Instructions (Signed)
 [facility] - A DEPT OF [facility]  Discharge Instructions: Thank you for choosing [facility] to provide your oncology and hematology care.   If you have a lab appointment with the Cancer Center, please go directly to the Cancer Center and check in at the registration area.   Wear comfortable clothing and clothing appropriate for easy access to any Portacath or PICC line.   We strive to give you quality time with your provider. You may need to reschedule your appointment if you arrive late (15 or more minutes).  Arriving late affects you and other patients whose appointments are after yours.  Also, if you miss three or more appointments without notifying the office, you may be dismissed from the clinic at the provider's discretion.      For prescription refill requests, have your pharmacy contact our office and allow 72 hours for refills to be completed.    Today you received the following chemotherapy and/or immunotherapy agent: Daratumumab (Darzalex)      To help prevent nausea and vomiting after your treatment, we encourage you to take your nausea medication as directed.  BELOW ARE SYMPTOMS THAT SHOULD BE REPORTED IMMEDIATELY: *FEVER GREATER THAN 100.4 F (38 C) OR HIGHER *CHILLS OR SWEATING *NAUSEA AND VOMITING THAT IS NOT CONTROLLED WITH YOUR NAUSEA MEDICATION *UNUSUAL SHORTNESS OF BREATH *UNUSUAL BRUISING OR BLEEDING *URINARY PROBLEMS (pain or burning when urinating, or frequent urination) *BOWEL PROBLEMS (unusual diarrhea, constipation, pain near the anus) TENDERNESS IN MOUTH AND THROAT WITH OR WITHOUT PRESENCE OF ULCERS (sore throat, sores in mouth, or a toothache) UNUSUAL RASH, SWELLING OR PAIN  UNUSUAL VAGINAL DISCHARGE OR ITCHING   Items with * indicate a potential emergency and should be followed up as soon as possible or go to the Emergency Department if any problems should occur.  Please show the CHEMOTHERAPY ALERT CARD or  IMMUNOTHERAPY ALERT CARD at check-in to the Emergency Department and triage nurse.  Should you have questions after your visit or need to cancel or reschedule your appointment, please contact [facility] - A DEPT OF [facility]  Dept: [phone]  and follow the prompts.  Office hours are 8:00 a.m. to 4:30 p.m. Monday - Friday. Please note that voicemails left after 4:00 p.m. may not be returned until the following business day.  We are closed weekends and major holidays. You have access to a nurse at all times for urgent questions. Please call the main number to the clinic Dept: [phone] and follow the prompts.   For any non-urgent questions, you may also contact your provider using MyChart. We now offer e-Visits for anyone 3 and older to request care online for non-urgent symptoms. For details visit [email or web address].   Also download the MyChart app! Go to the app store, search "MyChart", open the app, select [place], and log in with your MyChart username and password.  Daratumumab Injection What is this medication? DARATUMUMAB (dar a toom ue mab) treats multiple myeloma, a type of bone marrow cancer. It works by helping your immune system slow or stop the spread of cancer cells. It is a monoclonal antibody. This medicine may be used for other purposes; ask your health care provider or pharmacist if you have questions. COMMON BRAND NAME(S): DARZALEX What should I tell my care team before I take this medication? They need to know if you have any of these conditions: Hereditary fructose intolerance Infection,  such as chickenpox, herpes, hepatitis B Lung or breathing disease, such as asthma, COPD An unusual or allergic reaction to daratumumab, sorbitol, other medications, foods, dyes, or preservatives Pregnant or trying to get pregnant Breastfeeding How should I use this medication? This medication is injected into a vein. It is given by your care  team in a hospital or clinic setting. Talk to your care team about the use of this medication in children. Special care may be needed. Overdosage: If you think you have taken too much of this medicine contact a poison control center or emergency room at once. NOTE: This medicine is only for you. Do not share this medicine with others. What if I miss a dose? Keep appointments for follow-up doses. It is important not to miss your dose. Call your care team if you are unable to keep an appointment. What may interact with this medication? Interactions have not been studied. This list may not describe all possible interactions. Give your health care provider a list of all the medicines, herbs, non-prescription drugs, or dietary supplements you use. Also tell them if you smoke, drink alcohol, or use illegal drugs. Some items may interact with your medicine. What should I watch for while using this medication? Your condition will be monitored carefully while you are receiving this medication. This medication can cause serious allergic reactions. To reduce your risk, your care team may give you other medication to take before receiving this one. Be sure to follow the directions from your care team. This medication can affect the results of blood tests to match your blood type. These changes can last for up to 6 months after the final dose. Your care team will do blood tests to match your blood type before you start treatment. Tell all of your care team that you are being treated with this medication before receiving a blood transfusion. This medication can affect the results of some tests used to determine treatment response; extra tests may be needed to evaluate response. Talk to your care team if you wish to become pregnant or think you are pregnant. This medication can cause serious birth defects if taken during pregnancy and for 3 months after the last dose. A reliable form of contraception is recommended  while taking this medication and for 3 months after the last dose. Talk to your care team about effective forms of contraception. Do not breast-feed while taking this medication. What side effects may I notice from receiving this medication? Side effects that you should report to your care team as soon as possible: Allergic reactions--skin rash, itching, hives, swelling of the face, lips, tongue, or throat Infection--fever, chills, cough, sore throat, wounds that don't heal, pain or trouble when passing urine, general feeling of discomfort or being unwell Infusion reactions--chest pain, shortness of breath or trouble breathing, feeling faint or lightheaded Unusual bruising or bleeding Side effects that usually do not require medical attention (report to your care team if they continue or are bothersome): Constipation Diarrhea Fatigue Nausea Pain, tingling, or numbness in the hands or feet Swelling of the ankles, hands, or feet This list may not describe all possible side effects. Call your doctor for medical advice about side effects. You may report side effects to FDA at 1-800-FDA-1088. Where should I keep my medication? This medication is given in a hospital or clinic. It will not be stored at home. NOTE: This sheet is a summary. It may not cover all possible information. If you have questions about this  medicine, talk to your doctor, pharmacist, or health care provider.  2024 Elsevier/Gold Standard (2022-05-23 00:00:00)

## 2024-02-02 LAB — MULTIPLE MYELOMA PANEL, SERUM
Albumin SerPl Elph-Mcnc: 3.5 g/dL (ref 2.9–4.4)
Albumin/Glob SerPl: 1.5 (ref 0.7–1.7)
Alpha 1: 0.2 g/dL (ref 0.0–0.4)
Alpha2 Glob SerPl Elph-Mcnc: 1.2 g/dL — ABNORMAL HIGH (ref 0.4–1.0)
B-Globulin SerPl Elph-Mcnc: 0.8 g/dL (ref 0.7–1.3)
Gamma Glob SerPl Elph-Mcnc: 0.3 g/dL — ABNORMAL LOW (ref 0.4–1.8)
Globulin, Total: 2.5 g/dL (ref 2.2–3.9)
IgA: 68 mg/dL (ref 64–422)
IgG (Immunoglobin G), Serum: 418 mg/dL — ABNORMAL LOW (ref 586–1602)
IgM (Immunoglobulin M), Srm: 41 mg/dL (ref 26–217)
Total Protein ELP: 6 g/dL (ref 6.0–8.5)

## 2024-02-06 ENCOUNTER — Other Ambulatory Visit: Payer: Self-pay

## 2024-02-09 ENCOUNTER — Other Ambulatory Visit: Payer: Self-pay

## 2024-02-09 ENCOUNTER — Telehealth: Payer: Self-pay | Admitting: Family Medicine

## 2024-02-09 MED ORDER — LEVOCETIRIZINE DIHYDROCHLORIDE 5 MG PO TABS: 5.0000 mg | ORAL_TABLET | Freq: Every evening | ORAL | 1 refills | Status: DC

## 2024-02-09 NOTE — Telephone Encounter (Signed)
Refilled has been sent

## 2024-02-09 NOTE — Telephone Encounter (Unsigned)
 Copied from CRM [id number]. Topic: Clinical - Medication Refill >> Feb 09, 2024 11:10 AM [place] wrote: Medication: levocetirizine (XYZAL ) 5 MG tablet  Has the patient contacted their pharmacy? Yes (Agent: If no, request that the patient contact the pharmacy for the refill. If patient does not wish to contact the pharmacy document the reason why and proceed with request.) (Agent: If yes, when and what did the pharmacy advise?)  This is the patient's preferred pharmacy:    [facility] - [place], [place] - [address] [place] [address] Phone: [phone] Fax: [phone]   Is this the correct pharmacy for this prescription? Yes If no, delete pharmacy and type the correct one.   Has the prescription been filled recently? Yes  Is the patient out of the medication? Yes  Has the patient been seen for an appointment in the last year OR does the patient have an upcoming appointment? Yes  Can we respond through MyChart? Yes  Agent: Please be advised that Rx refills may take up to 3 business days. We ask that you follow-up with your pharmacy.

## 2024-02-09 NOTE — Telephone Encounter (Signed)
 Not on current med list.

## 2024-02-19 DIAGNOSIS — M1712 Unilateral primary osteoarthritis, left knee: Secondary | ICD-10-CM | POA: Diagnosis not present

## 2024-02-19 DIAGNOSIS — M170 Bilateral primary osteoarthritis of knee: Secondary | ICD-10-CM | POA: Diagnosis not present

## 2024-02-20 ENCOUNTER — Other Ambulatory Visit: Payer: Self-pay | Admitting: Hematology

## 2024-02-20 DIAGNOSIS — C9000 Multiple myeloma not having achieved remission: Secondary | ICD-10-CM

## 2024-02-23 DIAGNOSIS — M533 Sacrococcygeal disorders, not elsewhere classified: Secondary | ICD-10-CM | POA: Diagnosis not present

## 2024-02-23 DIAGNOSIS — M25551 Pain in right hip: Secondary | ICD-10-CM | POA: Diagnosis not present

## 2024-02-25 ENCOUNTER — Emergency Department (HOSPITAL_COMMUNITY): Admission: EM | Admit: 2024-02-25 | Discharge: 2024-02-25 | Disposition: A | Discharge status: Home / Self Care

## 2024-02-25 ENCOUNTER — Other Ambulatory Visit: Payer: Self-pay

## 2024-02-25 ENCOUNTER — Encounter (HOSPITAL_COMMUNITY): Payer: Self-pay | Admitting: Emergency Medicine

## 2024-02-25 DIAGNOSIS — T63444A Toxic effect of venom of bees, undetermined, initial encounter: Secondary | ICD-10-CM

## 2024-02-25 DIAGNOSIS — C9000 Multiple myeloma not having achieved remission: Secondary | ICD-10-CM

## 2024-02-25 DIAGNOSIS — Z7984 Long term (current) use of oral hypoglycemic drugs: Secondary | ICD-10-CM | POA: Insufficient documentation

## 2024-02-25 DIAGNOSIS — R42 Dizziness and giddiness: Secondary | ICD-10-CM

## 2024-02-25 DIAGNOSIS — Z91040 Latex allergy status: Secondary | ICD-10-CM | POA: Insufficient documentation

## 2024-02-25 DIAGNOSIS — T63441A Toxic effect of venom of bees, accidental (unintentional), initial encounter: Secondary | ICD-10-CM | POA: Diagnosis not present

## 2024-02-25 DIAGNOSIS — Z8579 Personal history of other malignant neoplasms of lymphoid, hematopoietic and related tissues: Secondary | ICD-10-CM | POA: Insufficient documentation

## 2024-02-25 MED ORDER — MECLIZINE HCL 25 MG PO TABS: 25.0000 mg | ORAL_TABLET | Freq: Three times a day (TID) | ORAL | 0 refills | Status: DC | PRN

## 2024-02-25 MED ORDER — PREDNISONE 20 MG PO TABS
40.0000 mg | ORAL_TABLET | Freq: Once | ORAL | Status: AC
  Administered 2024-02-25: 40 mg via ORAL
  Filled 2024-02-25: qty 2

## 2024-02-25 MED ORDER — DIPHENHYDRAMINE HCL 25 MG PO TABS
25.0000 mg | ORAL_TABLET | Freq: Four times a day (QID) | ORAL | 0 refills | Status: AC | PRN
Start: 2024-02-25 — End: ?

## 2024-02-25 MED ORDER — DIPHENHYDRAMINE HCL 25 MG PO CAPS
25.0000 mg | ORAL_CAPSULE | Freq: Once | ORAL | Status: AC
  Administered 2024-02-25: 25 mg via ORAL
  Filled 2024-02-25: qty 1

## 2024-02-25 MED ORDER — OXYCODONE-ACETAMINOPHEN 5-325 MG PO TABS: 1.0000 | ORAL_TABLET | Freq: Four times a day (QID) | ORAL | 0 refills | Status: AC | PRN

## 2024-02-25 MED ORDER — OXYCODONE-ACETAMINOPHEN 5-325 MG PO TABS
1.0000 | ORAL_TABLET | Freq: Once | ORAL | Status: AC
  Administered 2024-02-25: 1 via ORAL
  Filled 2024-02-25: qty 1

## 2024-02-25 NOTE — ED Provider Notes (Signed)
 [facility] AT [facility] Provider Note   CSN: [id number] Arrival date & time: 02/25/24  8071     Patient presents with: Insect Bite   Tracy [name] is a 78 y.o. female.   78 year old female past medical history of multiple myeloma presenting to the emergency department today with pain in her right hand and over the left side of her face.  The patient was apparently stung by a bee or yellowjacket earlier today.  The patient has not had any difficulty breathing or swallowing since then.  Denies any rashes.  She has not any nausea or vomiting.  She came to the ER today due to pain primarily.  States that she has had swelling of her cheek as well as her hand.  She came to the emergency department that time further evaluation.        Prior to Admission medications   Medication Sig Start Date End Date Taking? Authorizing Provider  diphenhydrAMINE  (BENADRYL ) 25 MG tablet Take 1 tablet (25 mg total) by mouth every 6 (six) hours as needed. 02/25/24  Yes Ula Prentice SAUNDERS, MD  oxyCODONE -acetaminophen  (PERCOCET/ROXICET) 5-325 MG tablet Take 1 tablet by mouth every 6 (six) hours as needed for severe pain (pain score 7-10). 02/25/24  Yes Ula Prentice SAUNDERS, MD  acyclovir  (ZOVIRAX ) 400 MG tablet Take 1 tablet (400 mg total) by mouth 2 (two) times daily. 12/15/23   Onesimo Emaline Brink, MD  albuterol  (PROVENTIL ) (2.5 MG/3ML) 0.083% nebulizer solution Take 3 mLs (2.5 mg total) by nebulization every 4 (four) hours as needed for wheezing or shortness of breath. 12/31/23   Mahlon Comer BRAVO, MD  albuterol  (VENTOLIN  HFA) 108 [phone] Base) MCG/ACT inhaler Inhale 2 puffs into the lungs every 6 (six) hours as needed for wheezing or shortness of breath. 12/29/23   Vernon Ranks, MD  atorvastatin  (LIPITOR) 10 MG tablet TAKE 1 TABLET BY MOUTH DAILY 10/10/23   [name], Katherine E, MD  azithromycin  (ZITHROMAX ) 250 MG tablet 2 tabs on day 1, 1 tab on day 2-5 12/31/23   [name], Katherine E, MD   dicyclomine  (BENTYL ) 10 MG capsule Take 1 capsule (10 mg total) by mouth every 6 (six) hours. Patient taking differently: Take 10 mg by mouth 2 (two) times daily as needed for spasms. 10/03/23   Stacia Glendia BRAVO, MD  gabapentin  (NEURONTIN ) 300 MG capsule Take 2 capsules (600 mg total) by mouth 3 (three) times daily. 01/31/23 04/25/24  [name], Katherine E, MD  glucose blood (ACCU-CHEK GUIDE) test strip Use as instructed to check sugars 1-2 times daily. 02/10/20   [name], Katherine E, MD  levocetirizine (XYZAL ) 5 MG tablet Take 1 tablet (5 mg total) by mouth every evening. 02/09/24   [name], Katherine E, MD  lidocaine  (LIDODERM ) 5 % Place 1 patch onto the skin daily. Remove & Discard patch within 12 hours or as directed by MD 01/05/24   [name], Katherine E, MD  lidocaine -prilocaine  (EMLA ) cream APPLY TOPICALLY TO PORT SITE 1  HOUR PRIOR TO ACCESS AS NEEDED 02/20/24   Onesimo Emaline Brink, MD  meclizine  (ANTIVERT ) 25 MG tablet Take 1 tablet (25 mg total) by mouth 3 (three) times daily as needed for dizziness. 02/25/24   Webb, Padonda B, FNP  metFORMIN  (GLUCOPHAGE ) 500 MG tablet TAKE 1 TABLET BY MOUTH TWICE  DAILY WITH MEALS 12/23/23   [name], Katherine E, MD  ondansetron  (ZOFRAN ) 8 MG tablet Take 1 tablet (8 mg total) by mouth every 8 (eight) hours as needed for  nausea or vomiting. 01/31/23   Thayil, Irene T, PA-C  pantoprazole  (PROTONIX ) 40 MG tablet TAKE 1 TABLET BY MOUTH TWICE  DAILY 07/07/23   [name], Katherine E, MD  predniSONE  (DELTASONE ) 10 MG tablet 3 tabs x3 days and then 2 tabs x3 days and then 1 tab x3 days.  Take w/ food. 12/31/23   [name], Katherine E, MD  Tiotropium Bromide  Monohydrate (SPIRIVA  RESPIMAT) 2.5 MCG/ACT AERS Inhale 2 puffs into the lungs 2 (two) times daily. 12/29/23 01/28/24  Vernon Ranks, MD  tiZANidine  (ZANAFLEX ) 4 MG tablet TAKE 1 TABLET BY MOUTH AT  BEDTIME 01/08/24   [name], Katherine E, MD  traZODone  (DESYREL ) 100 MG tablet TAKE 1 TABLET BY MOUTH AT  BEDTIME 04/01/23   [name], Katherine E,  MD  Vitamin D , Ergocalciferol , (DRISDOL ) 1.25 MG (50000 UNIT) CAPS capsule TAKE 1 CAPSULE BY MOUTH ONCE  WEEKLY 08/18/23   Kale, Gautam Kishore, MD    Allergies: Bacitracin-neomycin-polymyxin  [neomycin-bacitracin zn-polymyx], Latex, Nsaids, Tape, Ambien [zolpidem tartrate], Amoxicillin, Clavulanic acid, Contrast media [iodinated contrast media], and Tessalon  [benzonatate ]    Review of Systems  Musculoskeletal:        Hand pain/swelling  All other systems reviewed and are negative.   Updated Vital Signs BP (!) 145/92   Pulse 85   Temp 98.2 F (36.8 C) (Oral)   Resp (!) 22   Wt 85.2 kg   SpO2 98%   BMI 36.68 kg/m   Physical Exam Vitals and nursing note reviewed.   Gen: NAD Eyes: PERRL, EOMI HEENT: no oropharyngeal swelling, patient does have some mild swelling to the left cheek  Neck: trachea midline Resp: clear to auscultation bilaterally Card: RRR, no murmurs, rubs, or gallops Abd: nontender, nondistended Extremities: no calf tenderness, no edema, the patient does have mild swelling to the fifth finger on the right hand with normal range of motion, there is some mild swelling over the dorsum of the right hand with soft compartments Vascular: 2+ radial pulses bilaterally, 2+ DP pulses bilaterally Skin: no rashes Psyc: acting appropriately   (all labs ordered are listed, but only abnormal results are displayed) Labs Reviewed - No data to display  EKG: None  Radiology: No results found.   Procedures   Medications Ordered in the ED  predniSONE  (DELTASONE ) tablet 40 mg (has no administration in time range)  diphenhydrAMINE  (BENADRYL ) capsule 25 mg (25 mg Oral Given 02/25/24 2012)  oxyCODONE -acetaminophen  (PERCOCET/ROXICET) 5-325 MG per tablet 1 tablet (1 tablet Oral Given 02/25/24 2113)                                    Medical Decision Making 78 year old female presents the emergency department today with what appears to be acute local reaction to bee sting.   The patient does not have any systemic symptoms.  She does not have any airway compromise or hives.  There are some swelling looks like it is going up her right hand.  She is unable to take NSAIDs due to renal insufficiency.  Will give her dose of prednisone  here.  Will give her Percocet for pain.  She is already received Benadryl  on arrival.  I think that she may be safely discharged with symptomatic treatment with return precautions.  Risk Prescription drug management.        Final diagnoses:  Bee sting, undetermined intent, initial encounter    ED Discharge Orders  Ordered    diphenhydrAMINE  (BENADRYL ) 25 MG tablet  Every 6 hours PRN        02/25/24 2123    oxyCODONE -acetaminophen  (PERCOCET/ROXICET) 5-325 MG tablet  Every 6 hours PRN        02/25/24 2123               Ula Prentice SAUNDERS, MD 02/25/24 2124

## 2024-02-25 NOTE — ED Provider Triage Note (Signed)
 Emergency Medicine Provider Triage Evaluation Note  Tracy [name] , a 78 y.o. female  was evaluated in triage.  Pt complains of being stung by a bee on her L cheek and R pinky. Patient states she is having severe pain. Denies being allergic to bees. However states that she thinks she put bacitracin on her finger which is also listed as an allergy in her chart. Denies fever, chills, shortness of breath, throat pain, or mouth swelling.   Review of Systems  Positive: R finger pain, R hand pain, L cheek pain, bee sting, possible allergic response Negative: Oral swelling, throat pain, shortness of breath, chest pain  Physical Exam  Wt 85.2 kg   BMI 36.68 kg/m  Gen:   Awake, no distress   Resp:  Normal effort  MSK:   Moves extremities without difficulty  Other:  R pinky swollen, patient not in acute distress however states she is pain  Medical Decision Making  Medically screening exam initiated at 7:54 PM.  Appropriate orders placed.  Klaryssa A Dettmann was informed that the remainder of the evaluation will be completed by another provider, this initial triage assessment does not replace that evaluation, and the importance of remaining in the ED until their evaluation is complete.  Orders: benadryl     Pearl Berlinger F, PA-C 02/25/24 8042

## 2024-02-25 NOTE — Progress Notes (Unsigned)
 Requested Prescriptions   Pending Prescriptions Disp Refills   meclizine  (ANTIVERT ) 25 MG tablet 30 tablet 0    Sig: Take 1 tablet (25 mg total) by mouth 3 (three) times daily as needed for dizziness.     Date of patient request: 02/25/24 Last office visit: Visit date not found Upcoming visit: 12/31/2023 Date of last refill: 01/26/24 Last refill amount: 30 days

## 2024-02-25 NOTE — ED Triage Notes (Addendum)
 Pt in with bee sting to L cheek and R pinky finger about PTA. Grimacing and tearful, c/o burning pain to R hand, denies any itching or throat tightness, no hives present. Took 650mg  tylenol  PTA, states she put bacitracin on R pinky which is listed as allergy that causes swelling

## 2024-02-25 NOTE — Discharge Instructions (Signed)
 Please take the Benadryl  as needed for rash or itching.  Please keep your hand elevated.  You may try ice to help with the swelling.  Take the Percocet as needed for pain.  Please return to the ER if you develop difficulty breathing, shortness of breath, or other concerning symptoms.

## 2024-02-26 NOTE — Progress Notes (Signed)
 [facility] Follow up:   Date of Service: 02/27/2024  Tracy Comer BRAVO, MD [address] [place] [address]  CC: Follow-up for continued evaluation and management of multiple myeloma  SUMMARY OF ONCOLOGIC HISTORY: Oncology History  History of right breast cancer  04/16/2000 Surgery   Left breast: Triple negative  invasive ductal cancer treated with lumpectomy, adjuvant chemotherapy, radiation , in [place] , unknown stage   06/07/2015 Mammogram   Right breast mass 6x 6 x 5 mm, right axillary lymph node with slight cortex thickening measured 5 mm    06/13/2015 Initial Diagnosis   Right breast needle biopsy: Invasive ductal carcinoma, grade 1, right axillary lymph node biopsy negative , ER 95%, PR 5%, Ki-67 10%, HER-2 negative   06/13/2015 Clinical Stage   Stage IA: T1b N0   07/07/2015 Surgery   Right lumpectomy: Invasive ductal carcinoma grade 1, 1 cm span, with low-grade DCIS, DCIS focally 0.1 cm to inferior margin, 0/3 lymph nodes negative, ER 95%, PR 5%, HER-2 negative ratio 1.1, Ki-67 10%   07/07/2015 Pathologic Stage   Stage IA: T1c N0   07/13/2015 Procedure   Breast High/Moderate Risk Panel reveals no clinically significant variant at ATM, BRCA1, BRCA2, CDH1, CHEK2, PALB2, PTEN, and TP53.     08/23/2015 - 09/21/2015 Radiation Therapy   Adjuvant Radiation: Right breast/ 42.5Gy at 2.5 Gy per fraction x 17 fractions.   Right breast boost/ 7.5 Gy at 2.5 Gy per fraction x 3 fractions    Anti-estrogen oral therapy   Patient refused antiestrogen therapy   10/20/2015 Survivorship   Survivorship care plan completed and mailed to patient in lieu of in person visit at her request   Iron  deficiency anemia  Multiple myeloma (HCC)  01/25/2020 Initial Diagnosis   Multiple myeloma (HCC)   02/11/2020 - 05/19/2020 Adjuvant Chemotherapy   Daratumumab  Weekly    02/29/2020 - 03/13/2020 Radiation Therapy   The targets were treated to a total dose of 20 Gy in 10  fractions of 2 Gy each to the left hip and L5 using one plan.   06/02/2020 - 10/05/2020 Adjuvant Chemotherapy   Daratumumab  and Carfilzomib    10/19/2020 -  Adjuvant Chemotherapy   Maintenance Daratumumab --initially every 2 weeks, then every 4 weeks.    05/24/2022 -  Chemotherapy   Patient is on Treatment Plan : MYELOMA Daratumumab  IV q28d     Malignant neoplasm metastatic to bone (HCC)  02/08/2021 Initial Diagnosis   Bone metastases (HCC)   05/24/2022 -  Chemotherapy   Patient is on Treatment Plan : MYELOMA Daratumumab  IV q28d       CURRENT THERAPY: Daratumumab /B12 injection   INTERVAL HISTORY:  Tracy [name] is a 78 y.o. female here for continued evaluation and management of multiple myeloma.   Patient was last seen by me on 01/02/2024 and reported SOB with coughing due to COPD exacerbation from metapneumovirus. She noted a mild cough with very mild phlegm, and neuropathy issues in her feet.  Patient presents in a wheelchair during today's visit. She reports that she has been doing well overall over the last couple of months.   Patient presented to the ED on 02/25/2024 for bee/yellow jacket sting. She reports that she was stung by a bee in her face and finger of her right hand. Patient reports that her finger sting was very painful. She reports swelling in her entire right hand. Patient notes that she was unable to bend any of her fingers. She  still has tenderness in her finger at this time. Patient notes previous visible hole in her finger, which is closed up. Patient notes that her facial sting was not as painful. She notes that she still has mild tenderness in her face at this time.   She reports that suddenly, for the last couple of month, she has had intermittent loss of taste buds. Patient notes that she has even been unable to taste her favorite foods sometimes. She also notes inability to smell sometimes. Patient notes that her loss of taste/smell issues started sometime after her  pneumonia infection. She denies any mouth soreness or sinus congestion.   Patient denies any infection issues besides her previous pneumonia infection. She denies any weakness.  Patient Active Problem List   Diagnosis Date Noted   COPD exacerbation (HCC) 12/26/2023   Acute bronchitis due to human metapneumovirus 12/26/2023   COPD with acute exacerbation (HCC) 12/24/2023   Syncope 07/13/2023   Syncope and collapse 07/13/2023   RLS (restless legs syndrome) 06/12/2022   Malignant neoplasm metastatic to bone (HCC) 02/08/2021   Angiodysplasia of intestine 08/23/2020   Port-A-Cath in place 08/04/2020   Counseling regarding advance care planning and goals of care 02/07/2020   Multiple myeloma (HCC) 01/25/2020   Dizziness 10/12/2019   Post-nasal drainage 10/12/2019   Allergic rhinitis 08/19/2018   Type 2 diabetes mellitus with diabetic neuropathy, unspecified (HCC) 11/05/2017   Encounter for long-term use of muscle relaxants 09/24/2017   CAD in native artery 09/24/2017   OSA (obstructive sleep apnea) 09/24/2017   Snorings 09/24/2017   Asthenia 06/30/2017   Morbid obesity (HCC) 06/02/2017   Depression 06/02/2017   Iron  deficiency anemia 09/23/2016   Anemia of chronic disease 09/28/2015   Genetic testing 08/21/2015   History of left breast cancer 07/13/2015   History of right breast cancer 06/20/2015   Hearing loss due to cerumen impaction 12/29/2014   Allergy to adhesive tape 05/19/2014   Hyperlipidemia 12/06/2013   GERD (gastroesophageal reflux disease) 12/06/2013   Cervical disc disease 11/11/2013   Osteopenia 05/25/2013   Allergic asthma 12/24/2012   Insomnia 04/02/2012   Physical exam, annual 04/02/2012   HTN (hypertension) 02/03/2012   Vertigo, benign positional 02/03/2012   Left groin pain 02/03/2012   Hip pain 10/10/2011   TIA (transient ischemic attack) 07/24/2011   Allergic reaction 07/05/2011   Osteoarthrosis involving lower leg 10/19/2010   Disorder of bone and  cartilage 05/01/2010   Generalized anxiety disorder 05/01/2010   Vitamin D  deficiency 02/20/2010   Unspecified chronic bronchitis (HCC) 08/24/2009   Other lymphedema 10/29/2007    is allergic to bacitracin-neomycin-polymyxin  [neomycin-bacitracin zn-polymyx], latex, nsaids, tape, ambien [zolpidem tartrate], amoxicillin, clavulanic acid, contrast media [iodinated contrast media], and tessalon  [benzonatate ].  MEDICAL HISTORY: Past Medical History:  Diagnosis Date   Angiodysplasia of intestine 08/23/2020   Anxiety    Arthritis    Breast cancer (HCC) 06/13/2015   CAD (coronary artery disease)    Cancer (HCC) 2000   breast cancer   Chronic bronchitis (HCC)    Chronic bronchitis (HCC)    Hyperlipidemia    Hypertension    Myocardial infarction [facility]) 2001   Personal history of radiation therapy    Restless leg    Stroke (HCC) 2004   TIA, no deficits    SURGICAL HISTORY: Past Surgical History:  Procedure Laterality Date   ABDOMINAL HYSTERECTOMY  1985   BREAST LUMPECTOMY Left 2000   radiation and chemo   BREAST LUMPECTOMY Right 2016  radiation   BREAST SURGERY  2001   lt breast lumpectomy   COLONOSCOPY WITH ESOPHAGOGASTRODUODENOSCOPY (EGD)  04/2020   ENTEROSCOPY N/A 03/15/2021   Procedure: ENTEROSCOPY;  Surgeon: Teressa Toribio SQUIBB, MD;  Location: [facility];  Service: Endoscopy;  Laterality: N/A;   GIVENS CAPSULE STUDY  07/2020   HOT HEMOSTASIS N/A 03/15/2021   Procedure: HOT HEMOSTASIS (ARGON PLASMA COAGULATION/BICAP);  Surgeon: Teressa Toribio SQUIBB, MD;  Location: [facility];  Service: Endoscopy;  Laterality: N/A;   IR IMAGING GUIDED PORT INSERTION  04/25/2020   RADIOACTIVE SEED GUIDED PARTIAL MASTECTOMY WITH AXILLARY SENTINEL LYMPH NODE BIOPSY Right 07/07/2015   Procedure: RIGHT RADIOACTIVE SEED GUIDED PARTIAL MASTECTOMY WITH AXILLARY SENTINEL LYMPH NODE BIOPSY;  Surgeon: Deward Null III, MD;  Location: [facility];  Service: General;  Laterality: Right;   SMALL  INTESTINE SURGERY     TUBAL LIGATION     UPPER GASTROINTESTINAL ENDOSCOPY      SOCIAL HISTORY: Social History   Socioeconomic History   Marital status: Divorced    Spouse name: Not on file   Number of children: 7   Years of education: Not on file   Highest education level: Not on file  Occupational History   Occupation: retired  Tobacco Use   Smoking status: Former    Current packs/day: 0.00    Average packs/day: 1 pack/day for 20.0 years (20.0 ttl pk-yrs)    Types: Cigarettes    Start date: 07/30/1991    Quit date: 07/30/2011    Years since quitting: 12.5   Smokeless tobacco: Never   Tobacco comments:    Quit >4 years ago; 1 ppd for about 5/20 years (remaining was less)  Vaping Use   Vaping status: Former  Substance and Sexual Activity   Alcohol use: No    Alcohol/week: 0.0 standard drinks of alcohol   Drug use: No   Sexual activity: Not Currently  Other Topics Concern   Not on file  Social History Narrative   Lives alone.  Retired.  Education:  11th grade GED.  Children:  7 (one here).    Social Drivers of Corporate investment banker Strain: High Risk (12/26/2022)   Overall Financial Resource Strain (CARDIA)    Difficulty of Paying Living Expenses: Hard  Food Insecurity: No Food Insecurity (01/01/2024)   Hunger Vital Sign    Worried About Running Out of Food in the Last Year: Never true    Ran Out of Food in the Last Year: Never true  Recent Concern: Food Insecurity - Food Insecurity Present (12/24/2023)   Hunger Vital Sign    Worried About Running Out of Food in the Last Year: Never true    Ran Out of Food in the Last Year: Sometimes true  Transportation Needs: No Transportation Needs (01/01/2024)   PRAPARE - Administrator, Civil Service (Medical): No    Lack of Transportation (Non-Medical): No  Physical Activity: Inactive (12/26/2022)   Exercise Vital Sign    Days of Exercise per Week: 0 days    Minutes of Exercise per Session: 0 min  Stress: Stress  Concern Present (12/26/2022)   [employer] of Occupational Health - Occupational Stress Questionnaire    Feeling of Stress : To some extent  Social Connections: Moderately Isolated (12/24/2023)   Social Connection and Isolation Panel    Frequency of Communication with Friends and Family: More than three times a week    Frequency of Social Gatherings with Friends and Family: Not on  file    Attends Religious Services: More than 4 times per year    Active Member of Clubs or Organizations: No    Attends Banker Meetings: Never    Marital Status: Divorced  Catering manager Violence: Not At Risk (01/01/2024)   Humiliation, Afraid, Rape, and Kick questionnaire    Fear of Current or Ex-Partner: No    Emotionally Abused: No    Physically Abused: No    Sexually Abused: No    FAMILY HISTORY: Family History  Problem Relation Age of Onset   Emphysema Mother 32       smoker   Diabetes Father    Lung cancer Sister        dx. <50; former smoker   Diabetes Brother    Diabetes Brother    Brain cancer Brother 47       unknown tumor type   Diabetes Paternal Aunt    Stroke Maternal Grandmother    Diabetes Paternal Grandmother    Cancer Daughter 45       neck cancer   Other Daughter        hysterectomy for unspecified reason   Colon cancer Daughter    Breast cancer Cousin    Cancer Cousin        unspecified type   Breast cancer Other        triple negative breast cancer in her 32s   Colon polyps Neg Hx    Esophageal cancer Neg Hx    Gallbladder disease Neg Hx    ROS  10 Point review of Systems was done is negative except as noted above.   PHYSICAL EXAMINATION  ECOG PERFORMANCE STATUS: 1 - Symptomatic but completely ambulatory .There were no vitals taken for this visit.  GENERAL:alert, in no acute distress and comfortable SKIN: no acute rashes, no significant lesions EYES: conjunctiva are pink and non-injected, sclera anicteric OROPHARYNX: MMM, no exudates, no  oropharyngeal erythema or ulceration NECK: supple, no JVD LYMPH:  no palpable lymphadenopathy in the cervical, axillary or inguinal regions LUNGS: clear to auscultation b/l with normal respiratory effort HEART: regular rate & rhythm ABDOMEN:  normoactive bowel sounds , non tender, not distended. Extremity: no pedal edema PSYCH: alert & oriented x 3 with fluent speech NEURO: no focal motor/sensory deficits   LABORATORY DATA: .    Latest Ref Rng & Units 01/29/2024   12:22 PM 01/09/2024   11:59 AM 01/02/2024   10:52 AM  CBC  WBC 4.0 - 10.5 K/uL 5.1  7.0  7.2   Hemoglobin 12.0 - 15.0 g/dL 9.6  9.6  8.4   Hematocrit 36.0 - 46.0 % 29.4  28.9  25.0   Platelets 150 - 400 K/uL 370  335  321    .    Latest Ref Rng & Units 01/29/2024   12:22 PM 01/09/2024   11:59 AM 01/02/2024   10:52 AM  CMP  Glucose 70 - 99 mg/dL 872  895  838   BUN 8 - 23 mg/dL 16  15  15    Creatinine 0.44 - 1.00 mg/dL 8.65  8.71  8.66   Sodium 135 - 145 mmol/L 143  143  142   Potassium 3.5 - 5.1 mmol/L 4.2  4.0  3.5   Chloride 98 - 111 mmol/L 109  107  107   CO2 22 - 32 mmol/L 26  28  25    Calcium  8.9 - 10.3 mg/dL 9.1  9.1  8.8   Total Protein 6.5 -  8.1 g/dL 6.2  6.0  6.3   Total Bilirubin 0.0 - 1.2 mg/dL 0.3  0.3  0.4   Alkaline Phos 38 - 126 U/L 69  52  56   AST 15 - 41 U/L 15  10  23    ALT 0 - 44 U/L 12  14  16      Mammogram 09/23/2022:   ASSESSMENT and THERAPY PLAN:   78 yo female here for follow-up of her multiple myeloma   1)  IgG Kappa Multiple myeloma with bone lesions, anemia, renal insuff. M spike @ 3.7g/dl on diagnosis. 1p deletion, polymorphic variant, 13q deletion Multiple myeloma panel from 06/28/2021 with no M spike.  IFE positive for IgG kappa monoclonal protein possibly from her daratumumab . Currently on monthly daratumumab  2) h/o DM2 3) Diabetic Neuropathy 4) CKD - likely from DM2, but could have an element of myeloma kidney. 5) h/o TIA and AMI 6) Iron  deficiency 7) B12 deficiency    PLAN:  -Discussed lab results on 02/27/2024 in detail with patient. CBC showed WBC of 7.4K, hemoglobin of 9.6, and platelets of 327K. -CBC stable -her last myeloma lab from 01/29/2024 showed undetectable M protein  -there is no clinical sign or lab evidence of myeloma progression at this time -continue daratumumab  once a month -educated patient that myeloma behavior can sometimes be predicted by genetic changes -Discussed that she was likely stung by a yellow jacket based on descriptions of paper nest at the time she was stung -discussed that reactions to bee stings can be very variable  -we discussed that her fingers were likely more painful from yellow jacket sting since there is increased sensitivity in the area due to there being lots of nerve endings in the fingers -recommend patient to stay UTD with vaccines, including pneumonia, flu, COVID -19, and RSV vaccines -recommend eating healthy, staying physical active, being stress-free, and sleeping well -discussed that sometimes viral infections can cause loss of taste -discussed that loss of taste is not usually related to daratuymumab. Discussed that dara can rarely cause loss of taste, but symptoms would typically not be intermittent.  -recommend to try salt and baking soda mouth washes to try to improve loss of taste -answered all of patient's questions in detail -she shall return to clinic in a couple months  Follow-up: Per integrated schedule monthly Dara faspro MD visit in 2 months  The total time spent in the appointment was 30 minutes* .  All of the patient's questions were answered with apparent satisfaction. The patient knows to call the clinic with any problems, questions or concerns.   Emaline Saran MD MS AAHIVMS [facility] Hematology/Oncology Physician [facility]  .*Total Encounter Time as defined by the Centers for Medicare and Medicaid Services includes, in addition to the face-to-face time of a patient visit  (documented in the note above) non-face-to-face time: obtaining and reviewing outside history, ordering and reviewing medications, tests or procedures, care coordination (communications with other health care professionals or caregivers) and documentation in the medical record.    I,Mitra Faeizi,acting as a Neurosurgeon for Emaline Saran, MD.,have documented all relevant documentation on the behalf of Emaline Saran, MD,as directed by  Emaline Saran, MD while in the presence of Emaline Saran, MD.  .I have reviewed the above documentation for accuracy and completeness, and I agree with the above. .Fuller Makin Kishore Kaheem Halleck MD

## 2024-02-27 ENCOUNTER — Encounter: Payer: Self-pay | Admitting: Hematology

## 2024-02-27 ENCOUNTER — Inpatient Hospital Stay

## 2024-02-27 ENCOUNTER — Inpatient Hospital Stay: Attending: Hematology

## 2024-02-27 ENCOUNTER — Inpatient Hospital Stay (HOSPITAL_BASED_OUTPATIENT_CLINIC_OR_DEPARTMENT_OTHER): Admitting: Hematology

## 2024-02-27 VITALS — BP 144/57 | HR 78 | Resp 18

## 2024-02-27 VITALS — BP 151/72 | HR 75 | Temp 97.3°F | Resp 18 | Wt 188.4 lb

## 2024-02-27 DIAGNOSIS — C9000 Multiple myeloma not having achieved remission: Secondary | ICD-10-CM

## 2024-02-27 DIAGNOSIS — Z853 Personal history of malignant neoplasm of breast: Secondary | ICD-10-CM | POA: Diagnosis not present

## 2024-02-27 DIAGNOSIS — C7951 Secondary malignant neoplasm of bone: Secondary | ICD-10-CM

## 2024-02-27 DIAGNOSIS — G2581 Restless legs syndrome: Secondary | ICD-10-CM | POA: Diagnosis not present

## 2024-02-27 DIAGNOSIS — N189 Chronic kidney disease, unspecified: Secondary | ICD-10-CM | POA: Diagnosis not present

## 2024-02-27 DIAGNOSIS — G4733 Obstructive sleep apnea (adult) (pediatric): Secondary | ICD-10-CM | POA: Insufficient documentation

## 2024-02-27 DIAGNOSIS — Z79899 Other long term (current) drug therapy: Secondary | ICD-10-CM | POA: Insufficient documentation

## 2024-02-27 DIAGNOSIS — D509 Iron deficiency anemia, unspecified: Secondary | ICD-10-CM | POA: Insufficient documentation

## 2024-02-27 DIAGNOSIS — Z803 Family history of malignant neoplasm of breast: Secondary | ICD-10-CM | POA: Diagnosis not present

## 2024-02-27 DIAGNOSIS — I2510 Atherosclerotic heart disease of native coronary artery without angina pectoris: Secondary | ICD-10-CM | POA: Insufficient documentation

## 2024-02-27 DIAGNOSIS — Z800 Family history of malignant neoplasm of digestive organs: Secondary | ICD-10-CM | POA: Diagnosis not present

## 2024-02-27 DIAGNOSIS — Z8673 Personal history of transient ischemic attack (TIA), and cerebral infarction without residual deficits: Secondary | ICD-10-CM | POA: Insufficient documentation

## 2024-02-27 DIAGNOSIS — Z5112 Encounter for antineoplastic immunotherapy: Secondary | ICD-10-CM

## 2024-02-27 DIAGNOSIS — J4489 Other specified chronic obstructive pulmonary disease: Secondary | ICD-10-CM | POA: Insufficient documentation

## 2024-02-27 DIAGNOSIS — Z87891 Personal history of nicotine dependence: Secondary | ICD-10-CM | POA: Insufficient documentation

## 2024-02-27 DIAGNOSIS — E1122 Type 2 diabetes mellitus with diabetic chronic kidney disease: Secondary | ICD-10-CM | POA: Diagnosis not present

## 2024-02-27 DIAGNOSIS — I129 Hypertensive chronic kidney disease with stage 1 through stage 4 chronic kidney disease, or unspecified chronic kidney disease: Secondary | ICD-10-CM | POA: Insufficient documentation

## 2024-02-27 DIAGNOSIS — E1140 Type 2 diabetes mellitus with diabetic neuropathy, unspecified: Secondary | ICD-10-CM | POA: Diagnosis not present

## 2024-02-27 DIAGNOSIS — Z7189 Other specified counseling: Secondary | ICD-10-CM

## 2024-02-27 DIAGNOSIS — I252 Old myocardial infarction: Secondary | ICD-10-CM | POA: Diagnosis not present

## 2024-02-27 DIAGNOSIS — E538 Deficiency of other specified B group vitamins: Secondary | ICD-10-CM | POA: Diagnosis not present

## 2024-02-27 DIAGNOSIS — Z801 Family history of malignant neoplasm of trachea, bronchus and lung: Secondary | ICD-10-CM | POA: Diagnosis not present

## 2024-02-27 DIAGNOSIS — Z923 Personal history of irradiation: Secondary | ICD-10-CM | POA: Diagnosis not present

## 2024-02-27 DIAGNOSIS — Z90710 Acquired absence of both cervix and uterus: Secondary | ICD-10-CM | POA: Diagnosis not present

## 2024-02-27 DIAGNOSIS — G629 Polyneuropathy, unspecified: Secondary | ICD-10-CM | POA: Insufficient documentation

## 2024-02-27 DIAGNOSIS — Z95828 Presence of other vascular implants and grafts: Secondary | ICD-10-CM

## 2024-02-27 LAB — CBC WITH DIFFERENTIAL (CANCER CENTER ONLY)
Abs Immature Granulocytes: 0.02 K/uL (ref 0.00–0.07)
Basophils Absolute: 0 K/uL (ref 0.0–0.1)
Basophils Relative: 0 %
Eosinophils Absolute: 0.1 K/uL (ref 0.0–0.5)
Eosinophils Relative: 1 %
HCT: 28.6 % — ABNORMAL LOW (ref 36.0–46.0)
Hemoglobin: 9.6 g/dL — ABNORMAL LOW (ref 12.0–15.0)
Immature Granulocytes: 0 %
Lymphocytes Relative: 41 %
Lymphs Abs: 3 K/uL (ref 0.7–4.0)
MCH: 30.8 pg (ref 26.0–34.0)
MCHC: 33.6 g/dL (ref 30.0–36.0)
MCV: 91.7 fL (ref 80.0–100.0)
Monocytes Absolute: 0.5 K/uL (ref 0.1–1.0)
Monocytes Relative: 7 %
Neutro Abs: 3.7 K/uL (ref 1.7–7.7)
Neutrophils Relative %: 51 %
Platelet Count: 327 K/uL (ref 150–400)
RBC: 3.12 MIL/uL — ABNORMAL LOW (ref 3.87–5.11)
RDW: 14.3 % (ref 11.5–15.5)
WBC Count: 7.4 K/uL (ref 4.0–10.5)
nRBC: 0 % (ref 0.0–0.2)

## 2024-02-27 LAB — CMP (CANCER CENTER ONLY)
ALT: 10 U/L (ref 0–44)
AST: 11 U/L — ABNORMAL LOW (ref 15–41)
Albumin: 4.2 g/dL (ref 3.5–5.0)
Alkaline Phosphatase: 62 U/L (ref 38–126)
Anion gap: 8 (ref 5–15)
BUN: 19 mg/dL (ref 8–23)
CO2: 26 mmol/L (ref 22–32)
Calcium: 8.8 mg/dL — ABNORMAL LOW (ref 8.9–10.3)
Chloride: 107 mmol/L (ref 98–111)
Creatinine: 1.42 mg/dL — ABNORMAL HIGH (ref 0.44–1.00)
GFR, Estimated: 38 mL/min — ABNORMAL LOW (ref >60)
Glucose, Bld: 120 mg/dL — ABNORMAL HIGH (ref 70–99)
Potassium: 4.1 mmol/L (ref 3.5–5.1)
Sodium: 141 mmol/L (ref 135–145)
Total Bilirubin: 0.3 mg/dL (ref 0.0–1.2)
Total Protein: 6.6 g/dL (ref 6.5–8.1)

## 2024-02-27 MED ORDER — FAMOTIDINE IN NACL 20-0.9 MG/50ML-% IV SOLN
20.0000 mg | Freq: Once | INTRAVENOUS | Status: AC
  Administered 2024-02-27: 20 mg via INTRAVENOUS
  Filled 2024-02-27: qty 50

## 2024-02-27 MED ORDER — ACETAMINOPHEN 500 MG PO TABS
1000.0000 mg | ORAL_TABLET | Freq: Once | ORAL | Status: AC
  Administered 2024-02-27: 1000 mg via ORAL
  Filled 2024-02-27: qty 2

## 2024-02-27 MED ORDER — SODIUM CHLORIDE 0.9 % IV SOLN
16.0000 mg/kg | Freq: Once | INTRAVENOUS | Status: AC
  Administered 2024-02-27: 1300 mg via INTRAVENOUS
  Filled 2024-02-27: qty 5

## 2024-02-27 MED ORDER — SODIUM CHLORIDE 0.9 % IV SOLN
12.0000 mg | Freq: Once | INTRAVENOUS | Status: AC
  Administered 2024-02-27: 12 mg via INTRAVENOUS
  Filled 2024-02-27: qty 1.2

## 2024-02-27 MED ORDER — SODIUM CHLORIDE 0.9 % IV SOLN: Freq: Once | INTRAVENOUS | Status: AC

## 2024-02-27 MED ORDER — HEPARIN SOD (PORK) LOCK FLUSH 100 UNIT/ML IV SOLN
500.0000 [IU] | Freq: Once | INTRAVENOUS | Status: AC | PRN
Start: 2024-02-27 — End: 2024-02-27
  Administered 2024-02-27: 500 [IU]

## 2024-02-27 MED ORDER — DIPHENHYDRAMINE HCL 25 MG PO CAPS
50.0000 mg | ORAL_CAPSULE | Freq: Once | ORAL | Status: AC
  Administered 2024-02-27: 50 mg via ORAL
  Filled 2024-02-27: qty 2

## 2024-02-27 MED ORDER — CYANOCOBALAMIN 1000 MCG/ML IJ SOLN
1000.0000 ug | Freq: Once | INTRAMUSCULAR | Status: AC
  Administered 2024-02-27: 1000 ug via INTRAMUSCULAR
  Filled 2024-02-27: qty 1

## 2024-02-27 MED ORDER — SODIUM CHLORIDE 0.9% FLUSH
10.0000 mL | INTRAVENOUS | Status: DC | PRN
Start: 2024-02-27 — End: 2024-02-27
  Administered 2024-02-27: 10 mL

## 2024-02-27 MED ORDER — SODIUM CHLORIDE 0.9% FLUSH
10.0000 mL | Freq: Once | INTRAVENOUS | Status: AC
Start: 2024-02-27 — End: 2024-02-27
  Administered 2024-02-27: 10 mL

## 2024-02-27 NOTE — Patient Instructions (Signed)
 [facility] - A DEPT OF [facility]  Discharge Instructions: Thank you for choosing [facility] to provide your oncology and hematology care.   If you have a lab appointment with the Cancer Center, please go directly to the Cancer Center and check in at the registration area.   Wear comfortable clothing and clothing appropriate for easy access to any Portacath or PICC line.   We strive to give you quality time with your provider. You may need to reschedule your appointment if you arrive late (15 or more minutes).  Arriving late affects you and other patients whose appointments are after yours.  Also, if you miss three or more appointments without notifying the office, you may be dismissed from the clinic at the provider's discretion.      For prescription refill requests, have your pharmacy contact our office and allow 72 hours for refills to be completed.    Today you received the following chemotherapy and/or immunotherapy agents: Daratumumab.       To help prevent nausea and vomiting after your treatment, we encourage you to take your nausea medication as directed.  BELOW ARE SYMPTOMS THAT SHOULD BE REPORTED IMMEDIATELY: *FEVER GREATER THAN 100.4 F (38 C) OR HIGHER *CHILLS OR SWEATING *NAUSEA AND VOMITING THAT IS NOT CONTROLLED WITH YOUR NAUSEA MEDICATION *UNUSUAL SHORTNESS OF BREATH *UNUSUAL BRUISING OR BLEEDING *URINARY PROBLEMS (pain or burning when urinating, or frequent urination) *BOWEL PROBLEMS (unusual diarrhea, constipation, pain near the anus) TENDERNESS IN MOUTH AND THROAT WITH OR WITHOUT PRESENCE OF ULCERS (sore throat, sores in mouth, or a toothache) UNUSUAL RASH, SWELLING OR PAIN  UNUSUAL VAGINAL DISCHARGE OR ITCHING   Items with * indicate a potential emergency and should be followed up as soon as possible or go to the Emergency Department if any problems should occur.  Please show the CHEMOTHERAPY ALERT CARD or  IMMUNOTHERAPY ALERT CARD at check-in to the Emergency Department and triage nurse.  Should you have questions after your visit or need to cancel or reschedule your appointment, please contact [facility] - A DEPT OF [facility]  Dept: [phone]  and follow the prompts.  Office hours are 8:00 a.m. to 4:30 p.m. Monday - Friday. Please note that voicemails left after 4:00 p.m. may not be returned until the following business day.  We are closed weekends and major holidays. You have access to a nurse at all times for urgent questions. Please call the main number to the clinic Dept: [phone] and follow the prompts.   For any non-urgent questions, you may also contact your provider using MyChart. We now offer e-Visits for anyone 68 and older to request care online for non-urgent symptoms. For details visit [email or web address].   Also download the MyChart app! Go to the app store, search "MyChart", open the app, select [place], and log in with your MyChart username and password.

## 2024-02-27 NOTE — Progress Notes (Signed)
 Pt aware to let PCP know of elevated BP today

## 2024-03-02 LAB — MULTIPLE MYELOMA PANEL, SERUM
Albumin SerPl Elph-Mcnc: 3.5 g/dL (ref 2.9–4.4)
Albumin/Glob SerPl: 1.3 (ref 0.7–1.7)
Alpha 1: 0.2 g/dL (ref 0.0–0.4)
Alpha2 Glob SerPl Elph-Mcnc: 1.2 g/dL — ABNORMAL HIGH (ref 0.4–1.0)
B-Globulin SerPl Elph-Mcnc: 1 g/dL (ref 0.7–1.3)
Gamma Glob SerPl Elph-Mcnc: 0.4 g/dL (ref 0.4–1.8)
Globulin, Total: 2.8 g/dL (ref 2.2–3.9)
IgA: 61 mg/dL — ABNORMAL LOW (ref 64–422)
IgG (Immunoglobin G), Serum: 408 mg/dL — ABNORMAL LOW (ref 586–1602)
IgM (Immunoglobulin M), Srm: 37 mg/dL (ref 26–217)
M Protein SerPl Elph-Mcnc: 0.1 g/dL — ABNORMAL HIGH
Total Protein ELP: 6.3 g/dL (ref 6.0–8.5)

## 2024-03-04 ENCOUNTER — Other Ambulatory Visit: Payer: Self-pay

## 2024-03-04 ENCOUNTER — Encounter: Payer: Self-pay | Admitting: Hematology

## 2024-03-04 DIAGNOSIS — C9000 Multiple myeloma not having achieved remission: Secondary | ICD-10-CM

## 2024-03-05 ENCOUNTER — Inpatient Hospital Stay

## 2024-03-05 VITALS — BP 148/52 | HR 84 | Temp 98.9°F | Resp 18 | Wt 188.5 lb

## 2024-03-05 DIAGNOSIS — Z801 Family history of malignant neoplasm of trachea, bronchus and lung: Secondary | ICD-10-CM | POA: Diagnosis not present

## 2024-03-05 DIAGNOSIS — C9000 Multiple myeloma not having achieved remission: Secondary | ICD-10-CM | POA: Diagnosis not present

## 2024-03-05 DIAGNOSIS — Z95828 Presence of other vascular implants and grafts: Secondary | ICD-10-CM

## 2024-03-05 DIAGNOSIS — Z87891 Personal history of nicotine dependence: Secondary | ICD-10-CM | POA: Diagnosis not present

## 2024-03-05 DIAGNOSIS — Z800 Family history of malignant neoplasm of digestive organs: Secondary | ICD-10-CM | POA: Diagnosis not present

## 2024-03-05 DIAGNOSIS — Z8673 Personal history of transient ischemic attack (TIA), and cerebral infarction without residual deficits: Secondary | ICD-10-CM | POA: Diagnosis not present

## 2024-03-05 DIAGNOSIS — G629 Polyneuropathy, unspecified: Secondary | ICD-10-CM | POA: Diagnosis not present

## 2024-03-05 DIAGNOSIS — Z803 Family history of malignant neoplasm of breast: Secondary | ICD-10-CM | POA: Diagnosis not present

## 2024-03-05 DIAGNOSIS — G2581 Restless legs syndrome: Secondary | ICD-10-CM | POA: Diagnosis not present

## 2024-03-05 DIAGNOSIS — C7951 Secondary malignant neoplasm of bone: Secondary | ICD-10-CM

## 2024-03-05 DIAGNOSIS — J4489 Other specified chronic obstructive pulmonary disease: Secondary | ICD-10-CM | POA: Diagnosis not present

## 2024-03-05 DIAGNOSIS — Z923 Personal history of irradiation: Secondary | ICD-10-CM | POA: Diagnosis not present

## 2024-03-05 DIAGNOSIS — G4733 Obstructive sleep apnea (adult) (pediatric): Secondary | ICD-10-CM | POA: Diagnosis not present

## 2024-03-05 DIAGNOSIS — Z79899 Other long term (current) drug therapy: Secondary | ICD-10-CM | POA: Diagnosis not present

## 2024-03-05 DIAGNOSIS — Z853 Personal history of malignant neoplasm of breast: Secondary | ICD-10-CM | POA: Diagnosis not present

## 2024-03-05 DIAGNOSIS — I252 Old myocardial infarction: Secondary | ICD-10-CM | POA: Diagnosis not present

## 2024-03-05 DIAGNOSIS — E1122 Type 2 diabetes mellitus with diabetic chronic kidney disease: Secondary | ICD-10-CM | POA: Diagnosis not present

## 2024-03-05 DIAGNOSIS — I129 Hypertensive chronic kidney disease with stage 1 through stage 4 chronic kidney disease, or unspecified chronic kidney disease: Secondary | ICD-10-CM | POA: Diagnosis not present

## 2024-03-05 DIAGNOSIS — E538 Deficiency of other specified B group vitamins: Secondary | ICD-10-CM | POA: Diagnosis not present

## 2024-03-05 DIAGNOSIS — I2510 Atherosclerotic heart disease of native coronary artery without angina pectoris: Secondary | ICD-10-CM | POA: Diagnosis not present

## 2024-03-05 DIAGNOSIS — E1140 Type 2 diabetes mellitus with diabetic neuropathy, unspecified: Secondary | ICD-10-CM | POA: Diagnosis not present

## 2024-03-05 DIAGNOSIS — N189 Chronic kidney disease, unspecified: Secondary | ICD-10-CM | POA: Diagnosis not present

## 2024-03-05 DIAGNOSIS — Z5112 Encounter for antineoplastic immunotherapy: Secondary | ICD-10-CM | POA: Diagnosis not present

## 2024-03-05 DIAGNOSIS — D509 Iron deficiency anemia, unspecified: Secondary | ICD-10-CM | POA: Diagnosis not present

## 2024-03-05 LAB — CMP (CANCER CENTER ONLY)
ALT: 9 U/L (ref 0–44)
AST: 11 U/L — ABNORMAL LOW (ref 15–41)
Albumin: 4 g/dL (ref 3.5–5.0)
Alkaline Phosphatase: 68 U/L (ref 38–126)
Anion gap: 11 (ref 5–15)
BUN: 12 mg/dL (ref 8–23)
CO2: 24 mmol/L (ref 22–32)
Calcium: 8.5 mg/dL — ABNORMAL LOW (ref 8.9–10.3)
Chloride: 108 mmol/L (ref 98–111)
Creatinine: 1.21 mg/dL — ABNORMAL HIGH (ref 0.44–1.00)
GFR, Estimated: 46 mL/min — ABNORMAL LOW (ref >60)
Glucose, Bld: 149 mg/dL — ABNORMAL HIGH (ref 70–99)
Potassium: 3.9 mmol/L (ref 3.5–5.1)
Sodium: 143 mmol/L (ref 135–145)
Total Bilirubin: 0.3 mg/dL (ref 0.0–1.2)
Total Protein: 6.3 g/dL — ABNORMAL LOW (ref 6.5–8.1)

## 2024-03-05 LAB — CBC WITH DIFFERENTIAL (CANCER CENTER ONLY)
Abs Immature Granulocytes: 0.02 K/uL (ref 0.00–0.07)
Basophils Absolute: 0 K/uL (ref 0.0–0.1)
Basophils Relative: 1 %
Eosinophils Absolute: 0.1 K/uL (ref 0.0–0.5)
Eosinophils Relative: 1 %
HCT: 29.3 % — ABNORMAL LOW (ref 36.0–46.0)
Hemoglobin: 9.8 g/dL — ABNORMAL LOW (ref 12.0–15.0)
Immature Granulocytes: 0 %
Lymphocytes Relative: 32 %
Lymphs Abs: 1.9 K/uL (ref 0.7–4.0)
MCH: 31 pg (ref 26.0–34.0)
MCHC: 33.4 g/dL (ref 30.0–36.0)
MCV: 92.7 fL (ref 80.0–100.0)
Monocytes Absolute: 0.4 K/uL (ref 0.1–1.0)
Monocytes Relative: 7 %
Neutro Abs: 3.4 K/uL (ref 1.7–7.7)
Neutrophils Relative %: 59 %
Platelet Count: 252 K/uL (ref 150–400)
RBC: 3.16 MIL/uL — ABNORMAL LOW (ref 3.87–5.11)
RDW: 14.4 % (ref 11.5–15.5)
WBC Count: 5.8 K/uL (ref 4.0–10.5)
nRBC: 0 % (ref 0.0–0.2)

## 2024-03-05 MED ORDER — SODIUM CHLORIDE 0.9 % IV SOLN
60.0000 mg | Freq: Once | INTRAVENOUS | Status: AC
  Administered 2024-03-05: 60 mg via INTRAVENOUS
  Filled 2024-03-05: qty 10

## 2024-03-05 MED ORDER — SODIUM CHLORIDE 0.9% FLUSH
10.0000 mL | Freq: Once | INTRAVENOUS | Status: AC
  Administered 2024-03-05: 10 mL

## 2024-03-05 MED ORDER — SODIUM CHLORIDE 0.9 % IV SOLN: INTRAVENOUS | Status: DC

## 2024-03-05 MED ORDER — SODIUM CHLORIDE 0.9% FLUSH
10.0000 mL | Freq: Once | INTRAVENOUS | Status: AC
Start: 2024-03-05 — End: 2024-03-05
  Administered 2024-03-05: 10 mL

## 2024-03-05 NOTE — Progress Notes (Signed)
 Ok to proceed with pamidronate  with Ca=8.5 per Dr Onesimo

## 2024-03-05 NOTE — Patient Instructions (Signed)
 Pamidronate Injection What is this medication? PAMIDRONATE (pa mi DROE nate) treats high calcium levels in the blood caused by cancer. It may also be used with chemotherapy to treat weakened bones caused by cancer. It can also be used to treat Paget's disease of the bone. It works by slowing down the release of calcium from bones. This lowers calcium levels in your blood. It also makes your bones stronger and less likely to break (fracture). It belongs to a group of medications called bisphosphonates. This medicine may be used for other purposes; ask your health care provider or pharmacist if you have questions. COMMON BRAND NAME(S): Aredia What should I tell my care team before I take this medication? They need to know if you have any of these conditions: Bleeding disorder Cancer Dental disease Kidney disease Low levels of calcium or other minerals in the blood Low red blood cell counts Receiving steroids, such as dexamethasone or prednisone An unusual or allergic reaction to pamidronate, other medications, foods, dyes or preservatives Pregnant or trying to get pregnant Breast-feeding How should I use this medication? This medication is injected into a vein. It is given by your care team in a hospital or clinic setting. Talk to your care team about the use of this medication in children. Special care may be needed. Overdosage: If you think you have taken too much of this medicine contact a poison control center or emergency room at once. NOTE: This medicine is only for you. Do not share this medicine with others. What if I miss a dose? Keep appointments for follow-up doses. It is important not to miss your dose. Call your care team if you are unable to keep an appointment. What may interact with this medication? Certain antibiotics given by injection Medications for inflammation or pain, such as ibuprofen, naproxen Some diuretics, such as bumetanide, furosemide Cyclosporine Parathyroid  hormone Tacrolimus Teriparatide Thalidomide This list may not describe all possible interactions. Give your health care provider a list of all the medicines, herbs, non-prescription drugs, or dietary supplements you use. Also tell them if you smoke, drink alcohol, or use illegal drugs. Some items may interact with your medicine. What should I watch for while using this medication? Visit your care team for regular checks on your progress. It may be some time before you see the benefit from this medication. Some people who take this medication have severe bone, joint, or muscle pain. This medication may also increase your risk for jaw problems or a broken thigh bone. Tell your care team right away if you have severe pain in your jaw, bones, joints, or muscles. Tell your care team if you have any pain that does not go away or that gets worse. Tell your dentist and dental surgeon that you are taking this medication. You should not have major dental surgery while on this medication. See your dentist to have a dental exam and fix any dental problems before starting this medication. Take good care of your teeth while on this medication. Make sure you see your dentist for regular follow-up appointments. You should make sure you get enough calcium and vitamin D while you are taking this medication. Discuss the foods you eat and the vitamins you take with your care team. You may need bloodwork while you are taking this medication. Talk to your care team if you wish to become pregnant or think you might be pregnant. This medication can cause serious birth defects. What side effects may I notice from receiving  this medication? Side effects that you should report to your care team as soon as possible: Allergic reactions--skin rash, itching, hives, swelling of the face, lips, tongue, or throat Kidney injury--decrease in the amount of urine, swelling of the ankles, hands, or feet Low calcium level--muscle pain or  cramps, confusion, tingling, or numbness in the hands or feet Osteonecrosis of the jaw--pain, swelling, or redness in the mouth, numbness of the jaw, poor healing after dental work, unusual discharge from the mouth, visible bones in the mouth Severe bone, joint, or muscle pain Side effects that usually do not require medical attention (report to your care team if they continue or are bothersome): Constipation Fatigue Fever Loss of appetite Nausea Pain, redness, or irritation at injection site Stomach pain This list may not describe all possible side effects. Call your doctor for medical advice about side effects. You may report side effects to FDA at 1-800-FDA-1088. Where should I keep my medication? This medication is given in a hospital or clinic. It will not be stored at home. NOTE: This sheet is a summary. It may not cover all possible information. If you have questions about this medicine, talk to your doctor, pharmacist, or health care provider.  2024 Elsevier/Gold Standard (2021-09-03 00:00:00)

## 2024-03-07 ENCOUNTER — Other Ambulatory Visit: Payer: Self-pay | Admitting: Family Medicine

## 2024-03-08 ENCOUNTER — Other Ambulatory Visit: Payer: Self-pay | Admitting: Family Medicine

## 2024-03-08 NOTE — Telephone Encounter (Signed)
 Requested Prescriptions   Pending Prescriptions Disp Refills   traZODone  (DESYREL ) 100 MG tablet [Pharmacy Med Name: traZODone  HCl 100 MG Oral Tablet] 90 tablet 3    Sig: TAKE 1 TABLET BY MOUTH AT  BEDTIME     Date of patient request: 03/08/2024 Last office visit: 01/05/2024 Upcoming visit: Visit date not found Date of last refill: 04/01/2023 Last refill amount: 90

## 2024-03-10 ENCOUNTER — Telehealth: Payer: Self-pay

## 2024-03-10 ENCOUNTER — Other Ambulatory Visit: Payer: Self-pay

## 2024-03-10 DIAGNOSIS — R42 Dizziness and giddiness: Secondary | ICD-10-CM

## 2024-03-10 DIAGNOSIS — C9000 Multiple myeloma not having achieved remission: Secondary | ICD-10-CM

## 2024-03-10 MED ORDER — MECLIZINE HCL 25 MG PO TABS
25.0000 mg | ORAL_TABLET | Freq: Three times a day (TID) | ORAL | 0 refills | Status: AC | PRN
Start: 2024-03-10 — End: ?

## 2024-03-10 NOTE — Telephone Encounter (Signed)
 Refill has been sent.

## 2024-03-10 NOTE — Telephone Encounter (Signed)
 Sent to Dr.[name]

## 2024-03-10 NOTE — Telephone Encounter (Signed)
 It looks like [facility] refilled this on 7/30 but in case pt did not get prescription, ok to refill #30, 1 refill

## 2024-03-16 ENCOUNTER — Other Ambulatory Visit: Payer: Self-pay | Admitting: Family Medicine

## 2024-03-16 ENCOUNTER — Encounter: Payer: Self-pay | Admitting: Pulmonary Disease

## 2024-03-17 NOTE — Telephone Encounter (Signed)
 Patient is requesting a 1 year supply. Okay to fill?  Requested Prescriptions   Pending Prescriptions Disp Refills   pantoprazole  (PROTONIX ) 40 MG tablet [Pharmacy Med Name: Pantoprazole  Sodium 40 MG Oral Tablet Delayed Release] 200 tablet 2    Sig: TAKE 1 TABLET BY MOUTH TWICE  DAILY     Date of patient request: 03/17/24 Last office visit: 01/05/2024 Upcoming visit: Visit date not found Date of last refill: 07/07/23

## 2024-03-23 ENCOUNTER — Telehealth: Payer: Self-pay | Admitting: Family Medicine

## 2024-03-23 NOTE — Telephone Encounter (Signed)
 Type of form received: Chronic Condition Verification Form  Additional comments:   Received by: Fax  Form should be Faxed/mailed to: (address/ fax #)  [phone]  Is patient requesting call for pickup: N/A  Form placed:  Labeled & placed in provider bin  Attach charge sheet.  Provider will determine charge.  Individual made aware of 3-5 business day turn around? N/A

## 2024-03-23 NOTE — Telephone Encounter (Signed)
 Placed in folder at nurse station

## 2024-03-25 NOTE — Telephone Encounter (Signed)
 Faxed and placed in scan bin

## 2024-03-25 NOTE — Telephone Encounter (Signed)
 Form completed and returned to [place]

## 2024-03-26 ENCOUNTER — Other Ambulatory Visit

## 2024-03-26 ENCOUNTER — Ambulatory Visit

## 2024-04-02 ENCOUNTER — Inpatient Hospital Stay

## 2024-04-02 ENCOUNTER — Inpatient Hospital Stay: Attending: Hematology

## 2024-04-02 ENCOUNTER — Encounter: Payer: Self-pay | Admitting: Hematology

## 2024-04-02 VITALS — BP 153/78 | HR 77 | Temp 98.4°F | Resp 16 | Wt 186.1 lb

## 2024-04-02 DIAGNOSIS — Z803 Family history of malignant neoplasm of breast: Secondary | ICD-10-CM | POA: Diagnosis not present

## 2024-04-02 DIAGNOSIS — I2510 Atherosclerotic heart disease of native coronary artery without angina pectoris: Secondary | ICD-10-CM | POA: Insufficient documentation

## 2024-04-02 DIAGNOSIS — Z5112 Encounter for antineoplastic immunotherapy: Secondary | ICD-10-CM | POA: Insufficient documentation

## 2024-04-02 DIAGNOSIS — I129 Hypertensive chronic kidney disease with stage 1 through stage 4 chronic kidney disease, or unspecified chronic kidney disease: Secondary | ICD-10-CM | POA: Insufficient documentation

## 2024-04-02 DIAGNOSIS — Z853 Personal history of malignant neoplasm of breast: Secondary | ICD-10-CM | POA: Diagnosis not present

## 2024-04-02 DIAGNOSIS — Z79899 Other long term (current) drug therapy: Secondary | ICD-10-CM | POA: Insufficient documentation

## 2024-04-02 DIAGNOSIS — Z87891 Personal history of nicotine dependence: Secondary | ICD-10-CM | POA: Insufficient documentation

## 2024-04-02 DIAGNOSIS — G629 Polyneuropathy, unspecified: Secondary | ICD-10-CM | POA: Insufficient documentation

## 2024-04-02 DIAGNOSIS — D509 Iron deficiency anemia, unspecified: Secondary | ICD-10-CM | POA: Diagnosis not present

## 2024-04-02 DIAGNOSIS — C9000 Multiple myeloma not having achieved remission: Secondary | ICD-10-CM

## 2024-04-02 DIAGNOSIS — N189 Chronic kidney disease, unspecified: Secondary | ICD-10-CM | POA: Insufficient documentation

## 2024-04-02 DIAGNOSIS — Z800 Family history of malignant neoplasm of digestive organs: Secondary | ICD-10-CM | POA: Diagnosis not present

## 2024-04-02 DIAGNOSIS — E538 Deficiency of other specified B group vitamins: Secondary | ICD-10-CM | POA: Diagnosis not present

## 2024-04-02 DIAGNOSIS — J4489 Other specified chronic obstructive pulmonary disease: Secondary | ICD-10-CM | POA: Insufficient documentation

## 2024-04-02 DIAGNOSIS — Z8673 Personal history of transient ischemic attack (TIA), and cerebral infarction without residual deficits: Secondary | ICD-10-CM | POA: Insufficient documentation

## 2024-04-02 DIAGNOSIS — Z7189 Other specified counseling: Secondary | ICD-10-CM

## 2024-04-02 DIAGNOSIS — Z801 Family history of malignant neoplasm of trachea, bronchus and lung: Secondary | ICD-10-CM | POA: Insufficient documentation

## 2024-04-02 DIAGNOSIS — G4733 Obstructive sleep apnea (adult) (pediatric): Secondary | ICD-10-CM | POA: Diagnosis not present

## 2024-04-02 DIAGNOSIS — E1122 Type 2 diabetes mellitus with diabetic chronic kidney disease: Secondary | ICD-10-CM | POA: Diagnosis not present

## 2024-04-02 DIAGNOSIS — Z90710 Acquired absence of both cervix and uterus: Secondary | ICD-10-CM | POA: Insufficient documentation

## 2024-04-02 DIAGNOSIS — I252 Old myocardial infarction: Secondary | ICD-10-CM | POA: Diagnosis not present

## 2024-04-02 DIAGNOSIS — C7951 Secondary malignant neoplasm of bone: Secondary | ICD-10-CM

## 2024-04-02 DIAGNOSIS — E1140 Type 2 diabetes mellitus with diabetic neuropathy, unspecified: Secondary | ICD-10-CM | POA: Diagnosis not present

## 2024-04-02 DIAGNOSIS — G2581 Restless legs syndrome: Secondary | ICD-10-CM | POA: Diagnosis not present

## 2024-04-02 DIAGNOSIS — Z923 Personal history of irradiation: Secondary | ICD-10-CM | POA: Diagnosis not present

## 2024-04-02 LAB — CBC WITH DIFFERENTIAL (CANCER CENTER ONLY)
Abs Immature Granulocytes: 0.01 K/uL (ref 0.00–0.07)
Basophils Absolute: 0 K/uL (ref 0.0–0.1)
Basophils Relative: 1 %
Eosinophils Absolute: 0 K/uL (ref 0.0–0.5)
Eosinophils Relative: 1 %
HCT: 28.5 % — ABNORMAL LOW (ref 36.0–46.0)
Hemoglobin: 9.6 g/dL — ABNORMAL LOW (ref 12.0–15.0)
Immature Granulocytes: 0 %
Lymphocytes Relative: 46 %
Lymphs Abs: 2.3 K/uL (ref 0.7–4.0)
MCH: 31 pg (ref 26.0–34.0)
MCHC: 33.7 g/dL (ref 30.0–36.0)
MCV: 91.9 fL (ref 80.0–100.0)
Monocytes Absolute: 0.3 K/uL (ref 0.1–1.0)
Monocytes Relative: 7 %
Neutro Abs: 2.1 K/uL (ref 1.7–7.7)
Neutrophils Relative %: 45 %
Platelet Count: 232 K/uL (ref 150–400)
RBC: 3.1 MIL/uL — ABNORMAL LOW (ref 3.87–5.11)
RDW: 13.7 % (ref 11.5–15.5)
WBC Count: 4.8 K/uL (ref 4.0–10.5)
nRBC: 0 % (ref 0.0–0.2)

## 2024-04-02 LAB — CMP (CANCER CENTER ONLY)
ALT: 13 U/L (ref 0–44)
AST: 14 U/L — ABNORMAL LOW (ref 15–41)
Albumin: 4.1 g/dL (ref 3.5–5.0)
Alkaline Phosphatase: 55 U/L (ref 38–126)
Anion gap: 10 (ref 5–15)
BUN: 14 mg/dL (ref 8–23)
CO2: 23 mmol/L (ref 22–32)
Calcium: 8.8 mg/dL — ABNORMAL LOW (ref 8.9–10.3)
Chloride: 110 mmol/L (ref 98–111)
Creatinine: 1.38 mg/dL — ABNORMAL HIGH (ref 0.44–1.00)
GFR, Estimated: 39 mL/min — ABNORMAL LOW (ref >60)
Glucose, Bld: 115 mg/dL — ABNORMAL HIGH (ref 70–99)
Potassium: 3.9 mmol/L (ref 3.5–5.1)
Sodium: 143 mmol/L (ref 135–145)
Total Bilirubin: 0.3 mg/dL (ref 0.0–1.2)
Total Protein: 6.4 g/dL — ABNORMAL LOW (ref 6.5–8.1)

## 2024-04-02 MED ORDER — FAMOTIDINE IN NACL 20-0.9 MG/50ML-% IV SOLN
20.0000 mg | Freq: Once | INTRAVENOUS | Status: AC
  Administered 2024-04-02: 20 mg via INTRAVENOUS
  Filled 2024-04-02: qty 50

## 2024-04-02 MED ORDER — DIPHENHYDRAMINE HCL 25 MG PO CAPS
50.0000 mg | ORAL_CAPSULE | Freq: Once | ORAL | Status: AC
  Administered 2024-04-02: 50 mg via ORAL
  Filled 2024-04-02: qty 2

## 2024-04-02 MED ORDER — SODIUM CHLORIDE 0.9 % IV SOLN
12.0000 mg | Freq: Once | INTRAVENOUS | Status: AC
  Administered 2024-04-02: 12 mg via INTRAVENOUS
  Filled 2024-04-02: qty 1.2

## 2024-04-02 MED ORDER — SODIUM CHLORIDE 0.9 % IV SOLN
16.0000 mg/kg | Freq: Once | INTRAVENOUS | Status: AC
  Administered 2024-04-02: 1300 mg via INTRAVENOUS
  Filled 2024-04-02: qty 5

## 2024-04-02 MED ORDER — ACETAMINOPHEN 500 MG PO TABS
1000.0000 mg | ORAL_TABLET | Freq: Once | ORAL | Status: AC
  Administered 2024-04-02: 1000 mg via ORAL
  Filled 2024-04-02: qty 2

## 2024-04-02 MED ORDER — SODIUM CHLORIDE 0.9 % IV SOLN: Freq: Once | INTRAVENOUS | Status: AC

## 2024-04-02 MED ORDER — CYANOCOBALAMIN 1000 MCG/ML IJ SOLN
1000.0000 ug | Freq: Once | INTRAMUSCULAR | Status: AC
  Administered 2024-04-02: 1000 ug via INTRAMUSCULAR
  Filled 2024-04-02: qty 1

## 2024-04-02 MED ORDER — SODIUM CHLORIDE 0.9% FLUSH: 10.0000 mL | INTRAVENOUS | Status: DC | PRN

## 2024-04-02 NOTE — Patient Instructions (Signed)
 [facility] - A DEPT OF [facility]  Discharge Instructions: Thank you for choosing [facility] to provide your oncology and hematology care.   If you have a lab appointment with the Cancer Center, please go directly to the Cancer Center and check in at the registration area.   Wear comfortable clothing and clothing appropriate for easy access to any Portacath or PICC line.   We strive to give you quality time with your provider. You may need to reschedule your appointment if you arrive late (15 or more minutes).  Arriving late affects you and other patients whose appointments are after yours.  Also, if you miss three or more appointments without notifying the office, you may be dismissed from the clinic at the provider's discretion.      For prescription refill requests, have your pharmacy contact our office and allow 72 hours for refills to be completed.    Today you received the following chemotherapy and/or immunotherapy agent: Daratumumab (Darzalex)      To help prevent nausea and vomiting after your treatment, we encourage you to take your nausea medication as directed.  BELOW ARE SYMPTOMS THAT SHOULD BE REPORTED IMMEDIATELY: *FEVER GREATER THAN 100.4 F (38 C) OR HIGHER *CHILLS OR SWEATING *NAUSEA AND VOMITING THAT IS NOT CONTROLLED WITH YOUR NAUSEA MEDICATION *UNUSUAL SHORTNESS OF BREATH *UNUSUAL BRUISING OR BLEEDING *URINARY PROBLEMS (pain or burning when urinating, or frequent urination) *BOWEL PROBLEMS (unusual diarrhea, constipation, pain near the anus) TENDERNESS IN MOUTH AND THROAT WITH OR WITHOUT PRESENCE OF ULCERS (sore throat, sores in mouth, or a toothache) UNUSUAL RASH, SWELLING OR PAIN  UNUSUAL VAGINAL DISCHARGE OR ITCHING   Items with * indicate a potential emergency and should be followed up as soon as possible or go to the Emergency Department if any problems should occur.  Please show the CHEMOTHERAPY ALERT CARD or  IMMUNOTHERAPY ALERT CARD at check-in to the Emergency Department and triage nurse.  Should you have questions after your visit or need to cancel or reschedule your appointment, please contact [facility] - A DEPT OF [facility]  Dept: [phone]  and follow the prompts.  Office hours are 8:00 a.m. to 4:30 p.m. Monday - Friday. Please note that voicemails left after 4:00 p.m. may not be returned until the following business day.  We are closed weekends and major holidays. You have access to a nurse at all times for urgent questions. Please call the main number to the clinic Dept: [phone] and follow the prompts.   For any non-urgent questions, you may also contact your provider using MyChart. We now offer e-Visits for anyone 3 and older to request care online for non-urgent symptoms. For details visit [email or web address].   Also download the MyChart app! Go to the app store, search "MyChart", open the app, select [place], and log in with your MyChart username and password.  Daratumumab Injection What is this medication? DARATUMUMAB (dar a toom ue mab) treats multiple myeloma, a type of bone marrow cancer. It works by helping your immune system slow or stop the spread of cancer cells. It is a monoclonal antibody. This medicine may be used for other purposes; ask your health care provider or pharmacist if you have questions. COMMON BRAND NAME(S): DARZALEX What should I tell my care team before I take this medication? They need to know if you have any of these conditions: Hereditary fructose intolerance Infection,  such as chickenpox, herpes, hepatitis B Lung or breathing disease, such as asthma, COPD An unusual or allergic reaction to daratumumab, sorbitol, other medications, foods, dyes, or preservatives Pregnant or trying to get pregnant Breastfeeding How should I use this medication? This medication is injected into a vein. It is given by your care  team in a hospital or clinic setting. Talk to your care team about the use of this medication in children. Special care may be needed. Overdosage: If you think you have taken too much of this medicine contact a poison control center or emergency room at once. NOTE: This medicine is only for you. Do not share this medicine with others. What if I miss a dose? Keep appointments for follow-up doses. It is important not to miss your dose. Call your care team if you are unable to keep an appointment. What may interact with this medication? Interactions have not been studied. This list may not describe all possible interactions. Give your health care provider a list of all the medicines, herbs, non-prescription drugs, or dietary supplements you use. Also tell them if you smoke, drink alcohol, or use illegal drugs. Some items may interact with your medicine. What should I watch for while using this medication? Your condition will be monitored carefully while you are receiving this medication. This medication can cause serious allergic reactions. To reduce your risk, your care team may give you other medication to take before receiving this one. Be sure to follow the directions from your care team. This medication can affect the results of blood tests to match your blood type. These changes can last for up to 6 months after the final dose. Your care team will do blood tests to match your blood type before you start treatment. Tell all of your care team that you are being treated with this medication before receiving a blood transfusion. This medication can affect the results of some tests used to determine treatment response; extra tests may be needed to evaluate response. Talk to your care team if you wish to become pregnant or think you are pregnant. This medication can cause serious birth defects if taken during pregnancy and for 3 months after the last dose. A reliable form of contraception is recommended  while taking this medication and for 3 months after the last dose. Talk to your care team about effective forms of contraception. Do not breast-feed while taking this medication. What side effects may I notice from receiving this medication? Side effects that you should report to your care team as soon as possible: Allergic reactions--skin rash, itching, hives, swelling of the face, lips, tongue, or throat Infection--fever, chills, cough, sore throat, wounds that don't heal, pain or trouble when passing urine, general feeling of discomfort or being unwell Infusion reactions--chest pain, shortness of breath or trouble breathing, feeling faint or lightheaded Unusual bruising or bleeding Side effects that usually do not require medical attention (report to your care team if they continue or are bothersome): Constipation Diarrhea Fatigue Nausea Pain, tingling, or numbness in the hands or feet Swelling of the ankles, hands, or feet This list may not describe all possible side effects. Call your doctor for medical advice about side effects. You may report side effects to FDA at 1-800-FDA-1088. Where should I keep my medication? This medication is given in a hospital or clinic. It will not be stored at home. NOTE: This sheet is a summary. It may not cover all possible information. If you have questions about this  medicine, talk to your doctor, pharmacist, or health care provider.  2024 Elsevier/Gold Standard (2022-05-23 00:00:00)

## 2024-04-06 ENCOUNTER — Ambulatory Visit (INDEPENDENT_AMBULATORY_CARE_PROVIDER_SITE_OTHER): Admitting: *Deleted

## 2024-04-06 VITALS — Ht 60.0 in | Wt 186.0 lb

## 2024-04-06 DIAGNOSIS — Z0000 Encounter for general adult medical examination without abnormal findings: Secondary | ICD-10-CM

## 2024-04-06 NOTE — Patient Instructions (Signed)
 Tracy [name] , Thank you for taking time to come for your Medicare Wellness Visit. I appreciate your ongoing commitment to your health goals. Please review the following plan we discussed and let me know if I can assist you in the future.   Screening recommendations/referrals: Colonoscopy:  Mammogram: up to date Bone Density: up to date Recommended yearly ophthalmology/optometry visit for glaucoma screening and checkup Recommended yearly dental visit for hygiene and checkup  Vaccinations: Influenza vaccine:  Pneumococcal vaccine:  Tdap vaccine:  Shingles vaccine:        Preventive Care 65 Years and Older, Female Preventive care refers to lifestyle choices and visits with your health care provider that can promote health and wellness. What does preventive care include? A yearly physical exam. This is also called an annual well check. Dental exams once or twice a year. Routine eye exams. Ask your health care provider how often you should have your eyes checked. Personal lifestyle choices, including: Daily care of your teeth and gums. Regular physical activity. Eating a healthy diet. Avoiding tobacco and drug use. Limiting alcohol use. Practicing safe sex. Taking low-dose aspirin  every day. Taking vitamin and mineral supplements as recommended by your health care provider. What happens during an annual well check? The services and screenings done by your health care provider during your annual well check will depend on your age, overall health, lifestyle risk factors, and family history of disease. Counseling  Your health care provider may ask you questions about your: Alcohol use. Tobacco use. Drug use. Emotional well-being. Home and relationship well-being. Sexual activity. Eating habits. History of falls. Memory and ability to understand (cognition). Work and work Astronomer. Reproductive health. Screening  You may have the following tests or measurements: Height,  weight, and BMI. Blood pressure. Lipid and cholesterol levels. These may be checked every 5 years, or more frequently if you are over 43 years old. Skin check. Lung cancer screening. You may have this screening every year starting at age 66 if you have a 30-pack-year history of smoking and currently smoke or have quit within the past 15 years. Fecal occult blood test (FOBT) of the stool. You may have this test every year starting at age 36. Flexible sigmoidoscopy or colonoscopy. You may have a sigmoidoscopy every 5 years or a colonoscopy every 10 years starting at age 53. Hepatitis C blood test. Hepatitis B blood test. Sexually transmitted disease (STD) testing. Diabetes screening. This is done by checking your blood sugar (glucose) after you have not eaten for a while (fasting). You may have this done every 1-3 years. Bone density scan. This is done to screen for osteoporosis. You may have this done starting at age 59. Mammogram. This may be done every 1-2 years. Talk to your health care provider about how often you should have regular mammograms. Talk with your health care provider about your test results, treatment options, and if necessary, the need for more tests. Vaccines  Your health care provider may recommend certain vaccines, such as: Influenza vaccine. This is recommended every year. Tetanus, diphtheria, and acellular pertussis (Tdap, Td) vaccine. You may need a Td booster every 10 years. Zoster vaccine. You may need this after age 32. Pneumococcal 13-valent conjugate (PCV13) vaccine. One dose is recommended after age 63. Pneumococcal polysaccharide (PPSV23) vaccine. One dose is recommended after age 86. Talk to your health care provider about which screenings and vaccines you need and how often you need them. This information is not intended to replace advice given to  you by your health care provider. Make sure you discuss any questions you have with your health care  provider. Document Released: 08/11/2015 Document Revised: 04/03/2016 Document Reviewed: 05/16/2015 Elsevier Interactive Patient Education  2017 [employer].  Fall Prevention in the Home Falls can cause injuries. They can happen to people of all ages. There are many things you can do to make your home safe and to help prevent falls. What can I do on the outside of my home? Regularly fix the edges of walkways and driveways and fix any cracks. Remove anything that might make you trip as you walk through a door, such as a raised step or threshold. Trim any bushes or trees on the path to your home. Use bright outdoor lighting. Clear any walking paths of anything that might make someone trip, such as rocks or tools. Regularly check to see if handrails are loose or broken. Make sure that both sides of any steps have handrails. Any raised decks and porches should have guardrails on the edges. Have any leaves, snow, or ice cleared regularly. Use sand or salt on walking paths during winter. Clean up any spills in your garage right away. This includes oil or grease spills. What can I do in the bathroom? Use night lights. Install grab bars by the toilet and in the tub and shower. Do not use towel bars as grab bars. Use non-skid mats or decals in the tub or shower. If you need to sit down in the shower, use a plastic, non-slip stool. Keep the floor dry. Clean up any water that spills on the floor as soon as it happens. Remove soap buildup in the tub or shower regularly. Attach bath mats securely with double-sided non-slip rug tape. Do not have throw rugs and other things on the floor that can make you trip. What can I do in the bedroom? Use night lights. Make sure that you have a light by your bed that is easy to reach. Do not use any sheets or blankets that are too big for your bed. They should not hang down onto the floor. Have a firm chair that has side arms. You can use this for support while  you get dressed. Do not have throw rugs and other things on the floor that can make you trip. What can I do in the kitchen? Clean up any spills right away. Avoid walking on wet floors. Keep items that you use a lot in easy-to-reach places. If you need to reach something above you, use a strong step stool that has a grab bar. Keep electrical cords out of the way. Do not use floor polish or wax that makes floors slippery. If you must use wax, use non-skid floor wax. Do not have throw rugs and other things on the floor that can make you trip. What can I do with my stairs? Do not leave any items on the stairs. Make sure that there are handrails on both sides of the stairs and use them. Fix handrails that are broken or loose. Make sure that handrails are as long as the stairways. Check any carpeting to make sure that it is firmly attached to the stairs. Fix any carpet that is loose or worn. Avoid having throw rugs at the top or bottom of the stairs. If you do have throw rugs, attach them to the floor with carpet tape. Make sure that you have a light switch at the top of the stairs and the bottom of the stairs.  If you do not have them, ask someone to add them for you. What else can I do to help prevent falls? Wear shoes that: Do not have high heels. Have rubber bottoms. Are comfortable and fit you well. Are closed at the toe. Do not wear sandals. If you use a stepladder: Make sure that it is fully opened. Do not climb a closed stepladder. Make sure that both sides of the stepladder are locked into place. Ask someone to hold it for you, if possible. Clearly mark and make sure that you can see: Any grab bars or handrails. First and last steps. Where the edge of each step is. Use tools that help you move around (mobility aids) if they are needed. These include: Canes. Walkers. Scooters. Crutches. Turn on the lights when you go into a dark area. Replace any light bulbs as soon as they burn  out. Set up your furniture so you have a clear path. Avoid moving your furniture around. If any of your floors are uneven, fix them. If there are any pets around you, be aware of where they are. Review your medicines with your doctor. Some medicines can make you feel dizzy. This can increase your chance of falling. Ask your doctor what other things that you can do to help prevent falls. This information is not intended to replace advice given to you by your health care provider. Make sure you discuss any questions you have with your health care provider. Document Released: 05/11/2009 Document Revised: 12/21/2015 Document Reviewed: 08/19/2014 Elsevier Interactive Patient Education  2017 [employer].

## 2024-04-06 NOTE — Progress Notes (Signed)
 Subjective:   Tracy [name] is a 78 y.o. female who presents for Medicare Annual (Subsequent) preventive examination.  Visit Complete: Virtual I connected with  Mahogony A Pannone on 04/06/24 by a audio enabled telemedicine application and verified that I am speaking with the correct person using two identifiers.  Patient Location: Home  Provider Location: Home Office  I discussed the limitations of evaluation and management by telemedicine. The patient expressed understanding and agreed to proceed.  Vital Signs: Because this visit was a virtual/telehealth visit, some criteria may be missing or patient reported. Any vitals not documented were not able to be obtained and vitals that have been documented are patient reported.pt  Cardiac Risk Factors include: advanced age (>70men, >21 women);obesity (BMI >30kg/m2)     Objective:    Today's Vitals   04/06/24 1439  Weight: 186 lb (84.4 kg)  Height: 5' (1.524 m)   Body mass index is 36.33 kg/m.     04/06/2024    2:36 PM 02/25/2024    7:42 PM 01/02/2024   12:16 PM 12/24/2023   11:13 AM 12/24/2023    6:26 AM 09/12/2023   12:28 PM 07/13/2023    4:29 PM  Advanced Directives  Does Patient Have a Medical Advance Directive? Yes No No  No No No  Type of Advance Directive Healthcare Power of Attorney        Does patient want to make changes to medical advance directive?   No - Patient declined   No - Patient declined   Copy of Healthcare Power of Attorney in Chart? No - copy requested        Would patient like information on creating a medical advance directive?  No - Patient declined  No - Patient declined       Current Medications (verified) Outpatient Encounter Medications as of 04/06/2024  Medication Sig   acyclovir  (ZOVIRAX ) 400 MG tablet Take 1 tablet (400 mg total) by mouth 2 (two) times daily.   albuterol  (PROVENTIL ) (2.5 MG/3ML) 0.083% nebulizer solution Take 3 mLs (2.5 mg total) by nebulization every 4 (four) hours as needed for  wheezing or shortness of breath.   albuterol  (VENTOLIN  HFA) 108 (90 Base) MCG/ACT inhaler Inhale 2 puffs into the lungs every 6 (six) hours as needed for wheezing or shortness of breath.   atorvastatin  (LIPITOR) 10 MG tablet TAKE 1 TABLET BY MOUTH DAILY   azithromycin  (ZITHROMAX ) 250 MG tablet 2 tabs on day 1, 1 tab on day 2-5   dicyclomine  (BENTYL ) 10 MG capsule Take 1 capsule (10 mg total) by mouth every 6 (six) hours.   diphenhydrAMINE  (BENADRYL ) 25 MG tablet Take 1 tablet (25 mg total) by mouth every 6 (six) hours as needed.   gabapentin  (NEURONTIN ) 300 MG capsule TAKE 2 CAPSULES BY MOUTH 3 TIMES DAILY   glucose blood (ACCU-CHEK GUIDE) test strip Use as instructed to check sugars 1-2 times daily.   levocetirizine (XYZAL ) 5 MG tablet Take 1 tablet (5 mg total) by mouth every evening.   lidocaine  (LIDODERM ) 5 % Place 1 patch onto the skin daily. Remove & Discard patch within 12 hours or as directed by MD   lidocaine -prilocaine  (EMLA ) cream APPLY TOPICALLY TO PORT SITE 1  HOUR PRIOR TO ACCESS AS NEEDED   meclizine  (ANTIVERT ) 25 MG tablet Take 1 tablet (25 mg total) by mouth 3 (three) times daily as needed for dizziness.   metFORMIN  (GLUCOPHAGE ) 500 MG tablet TAKE 1 TABLET BY MOUTH TWICE  DAILY WITH MEALS  ondansetron  (ZOFRAN ) 8 MG tablet Take 1 tablet (8 mg total) by mouth every 8 (eight) hours as needed for nausea or vomiting.   oxyCODONE -acetaminophen  (PERCOCET/ROXICET) 5-325 MG tablet Take 1 tablet by mouth every 6 (six) hours as needed for severe pain (pain score 7-10).   pantoprazole  (PROTONIX ) 40 MG tablet TAKE 1 TABLET BY MOUTH TWICE  DAILY   predniSONE  (DELTASONE ) 10 MG tablet 3 tabs x3 days and then 2 tabs x3 days and then 1 tab x3 days.  Take w/ food.   Tiotropium Bromide  Monohydrate (SPIRIVA  RESPIMAT) 2.5 MCG/ACT AERS Inhale 2 puffs into the lungs 2 (two) times daily.   tiZANidine  (ZANAFLEX ) 4 MG tablet TAKE 1 TABLET BY MOUTH AT  BEDTIME   traZODone  (DESYREL ) 100 MG tablet TAKE 1  TABLET BY MOUTH AT  BEDTIME   Vitamin D , Ergocalciferol , (DRISDOL ) 1.25 MG (50000 UNIT) CAPS capsule TAKE 1 CAPSULE BY MOUTH ONCE  WEEKLY   No facility-administered encounter medications on file as of 04/06/2024.    Allergies (verified) Bacitracin-neomycin-polymyxin  [neomycin-bacitracin zn-polymyx], Latex, Nsaids, Tape, Ambien [zolpidem tartrate], Amoxicillin, Clavulanic acid, Contrast media [iodinated contrast media], and Tessalon  [benzonatate ]   History: Past Medical History:  Diagnosis Date   Angiodysplasia of intestine 08/23/2020   Anxiety    Arthritis    Breast cancer (HCC) 06/13/2015   CAD (coronary artery disease)    Cancer (HCC) 2000   breast cancer   Chronic bronchitis (HCC)    Chronic bronchitis (HCC)    Hyperlipidemia    Hypertension    Myocardial infarction [facility]) 2001   Personal history of radiation therapy    Restless leg    Stroke (HCC) 2004   TIA, no deficits   Past Surgical History:  Procedure Laterality Date   ABDOMINAL HYSTERECTOMY  1985   BREAST LUMPECTOMY Left 2000   radiation and chemo   BREAST LUMPECTOMY Right 2016   radiation   BREAST SURGERY  2001   lt breast lumpectomy   COLONOSCOPY WITH ESOPHAGOGASTRODUODENOSCOPY (EGD)  04/2020   ENTEROSCOPY N/A 03/15/2021   Procedure: ENTEROSCOPY;  Surgeon: Teressa Toribio SQUIBB, MD;  Location: [facility];  Service: Endoscopy;  Laterality: N/A;   GIVENS CAPSULE STUDY  07/2020   HOT HEMOSTASIS N/A 03/15/2021   Procedure: HOT HEMOSTASIS (ARGON PLASMA COAGULATION/BICAP);  Surgeon: Teressa Toribio SQUIBB, MD;  Location: [facility];  Service: Endoscopy;  Laterality: N/A;   IR IMAGING GUIDED PORT INSERTION  04/25/2020   RADIOACTIVE SEED GUIDED PARTIAL MASTECTOMY WITH AXILLARY SENTINEL LYMPH NODE BIOPSY Right 07/07/2015   Procedure: RIGHT RADIOACTIVE SEED GUIDED PARTIAL MASTECTOMY WITH AXILLARY SENTINEL LYMPH NODE BIOPSY;  Surgeon: Deward Null III, MD;  Location: [facility];  Service: General;  Laterality: Right;    SMALL INTESTINE SURGERY     TUBAL LIGATION     UPPER GASTROINTESTINAL ENDOSCOPY     Family History  Problem Relation Age of Onset   Emphysema Mother 57       smoker   Diabetes Father    Lung cancer Sister        dx. <50; former smoker   Diabetes Brother    Diabetes Brother    Brain cancer Brother 20       unknown tumor type   Diabetes Paternal Aunt    Stroke Maternal Grandmother    Diabetes Paternal Grandmother    Cancer Daughter 45       neck cancer   Other Daughter        hysterectomy for unspecified reason  Colon cancer Daughter    Breast cancer Cousin    Cancer Cousin        unspecified type   Breast cancer Other        triple negative breast cancer in her 88s   Colon polyps Neg Hx    Esophageal cancer Neg Hx    Gallbladder disease Neg Hx    Social History   Socioeconomic History   Marital status: Divorced    Spouse name: Not on file   Number of children: 7   Years of education: Not on file   Highest education level: Not on file  Occupational History   Occupation: retired  Tobacco Use   Smoking status: Former    Current packs/day: 0.00    Average packs/day: 1 pack/day for 20.0 years (20.0 ttl pk-yrs)    Types: Cigarettes    Start date: 07/30/1991    Quit date: 07/30/2011    Years since quitting: 12.6   Smokeless tobacco: Never   Tobacco comments:    Quit >4 years ago; 1 ppd for about 5/20 years (remaining was less)  Vaping Use   Vaping status: Former  Substance and Sexual Activity   Alcohol use: No    Alcohol/week: 0.0 standard drinks of alcohol   Drug use: No   Sexual activity: Not Currently  Other Topics Concern   Not on file  Social History Narrative   Lives alone.  Retired.  Education:  11th grade GED.  Children:  7 (one here).    Social Drivers of Corporate investment banker Strain: Low Risk  (04/06/2024)   Overall Financial Resource Strain (CARDIA)    Difficulty of Paying Living Expenses: Not very hard  Food Insecurity: No Food Insecurity  (04/06/2024)   Hunger Vital Sign    Worried About Running Out of Food in the Last Year: Never true    Ran Out of Food in the Last Year: Never true  Transportation Needs: No Transportation Needs (04/06/2024)   PRAPARE - Administrator, Civil Service (Medical): No    Lack of Transportation (Non-Medical): No  Physical Activity: Inactive (04/06/2024)   Exercise Vital Sign    Days of Exercise per Week: 0 days    Minutes of Exercise per Session: 0 min  Stress: No Stress Concern Present (04/06/2024)   [employer] of Occupational Health - Occupational Stress Questionnaire    Feeling of Stress: Only a little  Social Connections: Moderately Isolated (04/06/2024)   Social Connection and Isolation Panel    Frequency of Communication with Friends and Family: More than three times a week    Frequency of Social Gatherings with Friends and Family: Once a week    Attends Religious Services: More than 4 times per year    Active Member of [employer] or Organizations: No    Attends Banker Meetings: Never    Marital Status: Divorced    Tobacco Counseling Counseling given: Not Answered Tobacco comments: Quit >4 years ago; 1 ppd for about 5/20 years (remaining was less)   Clinical Intake:  Pre-visit preparation completed: Yes  Pain : No/denies pain     Diabetes: No  How often do you need to have someone help you when you read instructions, pamphlets, or other written materials from your doctor or pharmacy?: 1 - Never  Interpreter Needed?: No  Information entered by :: Mliss Graff LPN   Activities of Daily Living    04/06/2024    2:39 PM 12/24/2023  11:11 AM  In your present state of health, do you have any difficulty performing the following activities:  Hearing? 0   Vision? 0   Difficulty concentrating or making decisions? 1   Walking or climbing stairs? 1   Dressing or bathing? 0   Doing errands, shopping? 0 0  Preparing Food and eating ? N   Using the Toilet? N    In the past six months, have you accidently leaked urine? Y   Do you have problems with loss of bowel control? N   Managing your Medications? N   Managing your Finances? N   Housekeeping or managing your Housekeeping? N     Patient Care Team: Mahlon Comer BRAVO, MD as PCP - General (Family Medicine) Cesario Boer, MD as Attending Physician (Physical Medicine and Rehabilitation) Sheril Coy, MD as Consulting Physician (Orthopedic Surgery) Carlie Clark, MD as Consulting Physician (Otolaryngology) Luis Purchase, MD as Consulting Physician (Gastroenterology) Curvin Deward MOULD, MD as Consulting Physician (General Surgery)  Indicate any recent Medical Services you may have received from other than [facility] providers in the past year (date may be approximate).     Assessment:   This is a routine wellness examination for [facility].  Hearing/Vision screen Hearing Screening - Comments:: No hearing aids Vision Screening - Comments:: Up to date Changing MD   Goals Addressed             This Visit's Progress    Patient Stated   Not on track    Drink more water     Patient Stated   On track    No goals     PharmD Care Plan   On track    CARE PLAN ENTRY Current Barriers:  Chronic Disease Management support, education, and care coordination needs related to Diabetes and Anxiety/Depression   Diabetes Pharmacist Clinical Goal(s): Over the next 90 days, patient will work with PharmD and providers to achieve A1c goal <8% Current regimen:  Metformin  500 mg twice daily  Patient self care activities - Over the next 90 days, patient will: Continue metformin  500 mg twice daily Anxiety/Depression Pharmacist Clinical Goal(s) Over the next 90 days, patient will work with PharmD and providers to minimize anxiety related symptoms. Current regimen:  Tapering off of sertraline : currently half of the 50 mg (25 mg) until tablets are gone Interventions: Continue current taper of  sertraline  Patient self care activities - Over the next 90 days, patient will: Continue current taper of sertraline  at half a tablet (25 mg) until gone  Follow-up visit goal documentation.      pt       No goals     Pt will find affordable housing   On track    Care Coordination Interventions: Provided education to patient re: Annual Wellness Visit, care coordination services Social Work referral for housing issues with section 8 Assessed social determinant of health barriers Completed Annual Wellness Visit 12/20/21       Depression Screen    04/06/2024    2:42 PM 03/05/2024    1:19 PM 02/27/2024   11:34 AM 01/29/2024   12:48 PM 01/05/2024    2:38 PM 07/22/2023   10:44 AM 04/30/2023    2:32 PM  PHQ 2/9 Scores  PHQ - 2 Score 0 0 0 0 0 0 2  PHQ- 9 Score 5    6 11 5     Fall Risk    04/06/2024    2:35 PM 12/31/2023   11:01 AM 10/29/2023  2:27 PM 08/04/2023    2:49 PM 07/22/2023   10:43 AM  Fall Risk   Falls in the past year? 1 0 0 1 1  Number falls in past yr: 0 0 0 0 0  Injury with Fall? 0 0 0 0 0  Risk for fall due to : Impaired balance/gait;Impaired mobility  No Fall Risks    Follow up Falls evaluation completed;Education provided;Falls prevention discussed        MEDICARE RISK AT HOME: Medicare Risk at Home Any stairs in or around the home?: No If so, are there any without handrails?: No Home free of loose throw rugs in walkways, pet beds, electrical cords, etc?: Yes Adequate lighting in your home to reduce risk of falls?: Yes Life alert?: No Use of a cane, walker or w/c?: Yes Grab bars in the bathroom?: Yes Shower chair or bench in shower?: Yes Elevated toilet seat or a handicapped toilet?: No  TIMED UP AND GO:  Was the test performed?  No    Cognitive Function:        04/06/2024    2:38 PM 12/26/2022    1:41 PM 12/20/2021    2:51 PM  6CIT Screen  What Year? 0 points 0 points 0 points  What month? 0 points 0 points 0 points  What time? 0 points 0 points 0 points   Count back from 20 0 points 0 points 0 points  Months in reverse 2 points 0 points 0 points  Repeat phrase 0 points 0 points 0 points  Total Score 2 points 0 points 0 points    Immunizations Immunization History  Administered Date(s) Administered   Fluad Quad(high Dose 65+) 05/31/2020   INFLUENZA, HIGH DOSE SEASONAL PF 05/30/2016, 05/20/2018, 04/09/2019   Influenza Split 04/28/2010, 06/24/2011   Influenza Whole 04/22/2013   Influenza,inj,Quad PF,6+ Mos 04/29/2015, 05/16/2017   Influenza-Unspecified 05/03/2014, 05/07/2022   Moderna Sars-Covid-2 Vaccination 09/27/2019, 10/25/2019, 06/05/2020   Pneumococcal Conjugate-13 06/01/2015   Pneumococcal Polysaccharide-23 05/10/2014   Td 04/05/2013   Zoster, Live 05/01/2010    TDAP status: Due, Education has been provided regarding the importance of this vaccine. Advised may receive this vaccine at local pharmacy or Health Dept. Aware to provide a copy of the vaccination record if obtained from local pharmacy or Health Dept. Verbalized acceptance and understanding.  Flu Vaccine status: Due, Education has been provided regarding the importance of this vaccine. Advised may receive this vaccine at local pharmacy or Health Dept. Aware to provide a copy of the vaccination record if obtained from local pharmacy or Health Dept. Verbalized acceptance and understanding.  Pneumococcal vaccine status: Due, Education has been provided regarding the importance of this vaccine. Advised may receive this vaccine at local pharmacy or Health Dept. Aware to provide a copy of the vaccination record if obtained from local pharmacy or Health Dept. Verbalized acceptance and understanding.  Covid-19 vaccine status: Information provided on how to obtain vaccines.   Qualifies for Shingles Vaccine? Yes   Zostavax completed No   Shingrix Completed?: No.    Education has been provided regarding the importance of this vaccine. Patient has been advised to call insurance  company to determine out of pocket expense if they have not yet received this vaccine. Advised may also receive vaccine at local pharmacy or Health Dept. Verbalized acceptance and understanding.  Screening Tests Health Maintenance  Topic Date Due   OPHTHALMOLOGY EXAM  05/19/2020   DTaP/Tdap/Td (2 - Tdap) 04/06/2023   FOOT EXAM  01/08/2024  Influenza Vaccine  02/27/2024   HEMOGLOBIN A1C  06/28/2024   Diabetic kidney evaluation - Urine ACR  10/29/2024   Lung Cancer Screening  12/27/2024   Diabetic kidney evaluation - eGFR measurement  04/02/2025   Medicare Annual Wellness (AWV)  04/06/2025   Pneumococcal Vaccine: 50+ Years  Completed   DEXA SCAN  Completed   HPV VACCINES  Aged Out   Meningococcal B Vaccine  Aged Out   Colonoscopy  Discontinued   COVID-19 Vaccine  Discontinued   Hepatitis C Screening  Discontinued   Zoster Vaccines- Shingrix  Discontinued    Health Maintenance  Health Maintenance Due  Topic Date Due   OPHTHALMOLOGY EXAM  05/19/2020   DTaP/Tdap/Td (2 - Tdap) 04/06/2023   FOOT EXAM  01/08/2024   Influenza Vaccine  02/27/2024    Colorectal cancer screening: No longer required.   Mammogram status: Completed  . Repeat every year  Bone Density status: Completed 2022. Results reflect: Bone density results: NORMAL. Repeat every   years.  Lung Cancer Screening: (Low Dose CT Chest recommended if Age 33-80 years, 20 pack-year currently smoking OR have quit w/in 15years.) does not qualify.   Lung Cancer Screening Referral: due 2026  Additional Screening:  Hepatitis C Screening: does not qualify; Completed 2021  Vision Screening: Recommended annual ophthalmology exams for early detection of glaucoma and other disorders of the eye. Is the patient up to date with their annual eye exam?  Yes  Who is the provider or what is the name of the office in which the patient attends annual eye exams? Switching offices If pt is not established with a provider, would they like  to be referred to a provider to establish care? No .   Dental Screening: Recommended annual dental exams for proper oral hygiene    Community Resource Referral / Chronic Care Management: CRR required this visit?  No   CCM required this visit?  No     Plan:     I have personally reviewed and noted the following in the patient's chart:   Medical and social history Use of alcohol, tobacco or illicit drugs  Current medications and supplements including opioid prescriptions. Patient is not currently taking opioid prescriptions. Functional ability and status Nutritional status Physical activity Advanced directives List of other physicians Hospitalizations, surgeries, and ER visits in previous 12 months Vitals Screenings to include cognitive, depression, and falls Referrals and appointments  In addition, I have reviewed and discussed with patient certain preventive protocols, quality metrics, and best practice recommendations. A written personalized care plan for preventive services as well as general preventive health recommendations were provided to patient.     Mliss Graff, LPN   [id number]   After Visit Summary: (MyChart) Due to this being a telephonic visit, the after visit summary with patients personalized plan was offered to patient via MyChart   Nurse Notes:

## 2024-04-07 ENCOUNTER — Other Ambulatory Visit: Payer: Self-pay | Admitting: Hematology

## 2024-04-07 DIAGNOSIS — C9000 Multiple myeloma not having achieved remission: Secondary | ICD-10-CM

## 2024-04-07 DIAGNOSIS — C7951 Secondary malignant neoplasm of bone: Secondary | ICD-10-CM

## 2024-04-07 DIAGNOSIS — Z7189 Other specified counseling: Secondary | ICD-10-CM

## 2024-04-07 LAB — MULTIPLE MYELOMA PANEL, SERUM
Albumin SerPl Elph-Mcnc: 3.5 g/dL (ref 2.9–4.4)
Albumin/Glob SerPl: 1.5 (ref 0.7–1.7)
Alpha 1: 0.2 g/dL (ref 0.0–0.4)
Alpha2 Glob SerPl Elph-Mcnc: 1.1 g/dL — ABNORMAL HIGH (ref 0.4–1.0)
B-Globulin SerPl Elph-Mcnc: 1 g/dL (ref 0.7–1.3)
Gamma Glob SerPl Elph-Mcnc: 0.3 g/dL — ABNORMAL LOW (ref 0.4–1.8)
Globulin, Total: 2.5 g/dL (ref 2.2–3.9)
IgA: 62 mg/dL — ABNORMAL LOW (ref 64–422)
IgG (Immunoglobin G), Serum: 395 mg/dL — ABNORMAL LOW (ref 586–1602)
IgM (Immunoglobulin M), Srm: 41 mg/dL (ref 26–217)
Total Protein ELP: 6 g/dL (ref 6.0–8.5)

## 2024-04-13 ENCOUNTER — Other Ambulatory Visit: Payer: Self-pay

## 2024-04-30 ENCOUNTER — Inpatient Hospital Stay: Attending: Hematology

## 2024-04-30 ENCOUNTER — Encounter: Payer: Self-pay | Admitting: Hematology

## 2024-04-30 ENCOUNTER — Inpatient Hospital Stay

## 2024-04-30 VITALS — BP 154/71 | HR 72 | Temp 98.7°F | Resp 16 | Ht 60.0 in | Wt 187.8 lb

## 2024-04-30 DIAGNOSIS — G4733 Obstructive sleep apnea (adult) (pediatric): Secondary | ICD-10-CM | POA: Diagnosis not present

## 2024-04-30 DIAGNOSIS — C7951 Secondary malignant neoplasm of bone: Secondary | ICD-10-CM

## 2024-04-30 DIAGNOSIS — C9000 Multiple myeloma not having achieved remission: Secondary | ICD-10-CM

## 2024-04-30 DIAGNOSIS — Z800 Family history of malignant neoplasm of digestive organs: Secondary | ICD-10-CM | POA: Insufficient documentation

## 2024-04-30 DIAGNOSIS — E1140 Type 2 diabetes mellitus with diabetic neuropathy, unspecified: Secondary | ICD-10-CM | POA: Insufficient documentation

## 2024-04-30 DIAGNOSIS — I129 Hypertensive chronic kidney disease with stage 1 through stage 4 chronic kidney disease, or unspecified chronic kidney disease: Secondary | ICD-10-CM | POA: Diagnosis not present

## 2024-04-30 DIAGNOSIS — Z8673 Personal history of transient ischemic attack (TIA), and cerebral infarction without residual deficits: Secondary | ICD-10-CM | POA: Diagnosis not present

## 2024-04-30 DIAGNOSIS — Z853 Personal history of malignant neoplasm of breast: Secondary | ICD-10-CM | POA: Diagnosis not present

## 2024-04-30 DIAGNOSIS — Z87891 Personal history of nicotine dependence: Secondary | ICD-10-CM | POA: Insufficient documentation

## 2024-04-30 DIAGNOSIS — Z79899 Other long term (current) drug therapy: Secondary | ICD-10-CM | POA: Insufficient documentation

## 2024-04-30 DIAGNOSIS — G629 Polyneuropathy, unspecified: Secondary | ICD-10-CM | POA: Insufficient documentation

## 2024-04-30 DIAGNOSIS — I2510 Atherosclerotic heart disease of native coronary artery without angina pectoris: Secondary | ICD-10-CM | POA: Diagnosis not present

## 2024-04-30 DIAGNOSIS — Z803 Family history of malignant neoplasm of breast: Secondary | ICD-10-CM | POA: Diagnosis not present

## 2024-04-30 DIAGNOSIS — Z923 Personal history of irradiation: Secondary | ICD-10-CM | POA: Insufficient documentation

## 2024-04-30 DIAGNOSIS — E1122 Type 2 diabetes mellitus with diabetic chronic kidney disease: Secondary | ICD-10-CM | POA: Diagnosis not present

## 2024-04-30 DIAGNOSIS — Z5112 Encounter for antineoplastic immunotherapy: Secondary | ICD-10-CM | POA: Diagnosis present

## 2024-04-30 DIAGNOSIS — I252 Old myocardial infarction: Secondary | ICD-10-CM | POA: Insufficient documentation

## 2024-04-30 DIAGNOSIS — G2581 Restless legs syndrome: Secondary | ICD-10-CM | POA: Insufficient documentation

## 2024-04-30 DIAGNOSIS — N189 Chronic kidney disease, unspecified: Secondary | ICD-10-CM | POA: Diagnosis not present

## 2024-04-30 DIAGNOSIS — Z7189 Other specified counseling: Secondary | ICD-10-CM

## 2024-04-30 DIAGNOSIS — Z801 Family history of malignant neoplasm of trachea, bronchus and lung: Secondary | ICD-10-CM | POA: Diagnosis not present

## 2024-04-30 DIAGNOSIS — J4489 Other specified chronic obstructive pulmonary disease: Secondary | ICD-10-CM | POA: Insufficient documentation

## 2024-04-30 DIAGNOSIS — D509 Iron deficiency anemia, unspecified: Secondary | ICD-10-CM | POA: Insufficient documentation

## 2024-04-30 DIAGNOSIS — Z90710 Acquired absence of both cervix and uterus: Secondary | ICD-10-CM | POA: Diagnosis not present

## 2024-04-30 DIAGNOSIS — E538 Deficiency of other specified B group vitamins: Secondary | ICD-10-CM | POA: Diagnosis not present

## 2024-04-30 LAB — CBC WITH DIFFERENTIAL (CANCER CENTER ONLY)
Abs Immature Granulocytes: 0.01 K/uL (ref 0.00–0.07)
Basophils Absolute: 0.1 K/uL (ref 0.0–0.1)
Basophils Relative: 1 %
Eosinophils Absolute: 0.1 K/uL (ref 0.0–0.5)
Eosinophils Relative: 1 %
HCT: 30.7 % — ABNORMAL LOW (ref 36.0–46.0)
Hemoglobin: 10.5 g/dL — ABNORMAL LOW (ref 12.0–15.0)
Immature Granulocytes: 0 %
Lymphocytes Relative: 43 %
Lymphs Abs: 2.3 K/uL (ref 0.7–4.0)
MCH: 31.3 pg (ref 26.0–34.0)
MCHC: 34.2 g/dL (ref 30.0–36.0)
MCV: 91.4 fL (ref 80.0–100.0)
Monocytes Absolute: 0.4 K/uL (ref 0.1–1.0)
Monocytes Relative: 7 %
Neutro Abs: 2.6 K/uL (ref 1.7–7.7)
Neutrophils Relative %: 48 %
Platelet Count: 279 K/uL (ref 150–400)
RBC: 3.36 MIL/uL — ABNORMAL LOW (ref 3.87–5.11)
RDW: 13.3 % (ref 11.5–15.5)
WBC Count: 5.4 K/uL (ref 4.0–10.5)
nRBC: 0 % (ref 0.0–0.2)

## 2024-04-30 LAB — CMP (CANCER CENTER ONLY)
ALT: 11 U/L (ref 0–44)
AST: 13 U/L — ABNORMAL LOW (ref 15–41)
Albumin: 4.3 g/dL (ref 3.5–5.0)
Alkaline Phosphatase: 71 U/L (ref 38–126)
Anion gap: 9 (ref 5–15)
BUN: 19 mg/dL (ref 8–23)
CO2: 25 mmol/L (ref 22–32)
Calcium: 9.9 mg/dL (ref 8.9–10.3)
Chloride: 109 mmol/L (ref 98–111)
Creatinine: 1.32 mg/dL — ABNORMAL HIGH (ref 0.44–1.00)
GFR, Estimated: 41 mL/min — ABNORMAL LOW (ref >60)
Glucose, Bld: 124 mg/dL — ABNORMAL HIGH (ref 70–99)
Potassium: 4.3 mmol/L (ref 3.5–5.1)
Sodium: 143 mmol/L (ref 135–145)
Total Bilirubin: 0.3 mg/dL (ref 0.0–1.2)
Total Protein: 6.9 g/dL (ref 6.5–8.1)

## 2024-04-30 MED ORDER — CYANOCOBALAMIN 1000 MCG/ML IJ SOLN
1000.0000 ug | Freq: Once | INTRAMUSCULAR | Status: AC
  Administered 2024-04-30: 1000 ug via INTRAMUSCULAR
  Filled 2024-04-30: qty 1

## 2024-04-30 MED ORDER — DIPHENHYDRAMINE HCL 25 MG PO CAPS
50.0000 mg | ORAL_CAPSULE | Freq: Once | ORAL | Status: AC
  Administered 2024-04-30: 50 mg via ORAL
  Filled 2024-04-30: qty 2

## 2024-04-30 MED ORDER — SODIUM CHLORIDE 0.9 % IV SOLN
12.0000 mg | Freq: Once | INTRAVENOUS | Status: AC
  Administered 2024-04-30: 12 mg via INTRAVENOUS
  Filled 2024-04-30: qty 1.2

## 2024-04-30 MED ORDER — SODIUM CHLORIDE 0.9 % IV SOLN
16.0000 mg/kg | Freq: Once | INTRAVENOUS | Status: AC
  Administered 2024-04-30: 1300 mg via INTRAVENOUS
  Filled 2024-04-30: qty 5

## 2024-04-30 MED ORDER — SODIUM CHLORIDE 0.9 % IV SOLN: Freq: Once | INTRAVENOUS | Status: AC

## 2024-04-30 MED ORDER — ACETAMINOPHEN 500 MG PO TABS
1000.0000 mg | ORAL_TABLET | Freq: Once | ORAL | Status: AC
  Administered 2024-04-30: 1000 mg via ORAL
  Filled 2024-04-30: qty 2

## 2024-04-30 MED ORDER — FAMOTIDINE IN NACL 20-0.9 MG/50ML-% IV SOLN
20.0000 mg | Freq: Once | INTRAVENOUS | Status: AC
  Administered 2024-04-30: 20 mg via INTRAVENOUS
  Filled 2024-04-30: qty 50

## 2024-04-30 NOTE — Patient Instructions (Signed)
 [facility] - A DEPT OF [facility]   Discharge Instructions: Thank you for choosing [facility] to provide your oncology and hematology care.   If you have a lab appointment with the Cancer Center, please go directly to the Cancer Center and check in at the registration area.   Wear comfortable clothing and clothing appropriate for easy access to any Portacath or PICC line.   We strive to give you quality time with your provider. You may need to reschedule your appointment if you arrive late (15 or more minutes).  Arriving late affects you and other patients whose appointments are after yours.  Also, if you miss three or more appointments without notifying the office, you may be dismissed from the clinic at the provider's discretion.      For prescription refill requests, have your pharmacy contact our office and allow 72 hours for refills to be completed.    Today you received the following chemotherapy and/or immunotherapy agents: Daratumumab (Darzalex)      To help prevent nausea and vomiting after your treatment, we encourage you to take your nausea medication as directed.  BELOW ARE SYMPTOMS THAT SHOULD BE REPORTED IMMEDIATELY: *FEVER GREATER THAN 100.4 F (38 C) OR HIGHER *CHILLS OR SWEATING *NAUSEA AND VOMITING THAT IS NOT CONTROLLED WITH YOUR NAUSEA MEDICATION *UNUSUAL SHORTNESS OF BREATH *UNUSUAL BRUISING OR BLEEDING *URINARY PROBLEMS (pain or burning when urinating, or frequent urination) *BOWEL PROBLEMS (unusual diarrhea, constipation, pain near the anus) TENDERNESS IN MOUTH AND THROAT WITH OR WITHOUT PRESENCE OF ULCERS (sore throat, sores in mouth, or a toothache) UNUSUAL RASH, SWELLING OR PAIN  UNUSUAL VAGINAL DISCHARGE OR ITCHING   Items with * indicate a potential emergency and should be followed up as soon as possible or go to the Emergency Department if any problems should occur.  Please show the CHEMOTHERAPY ALERT CARD or  IMMUNOTHERAPY ALERT CARD at check-in to the Emergency Department and triage nurse.  Should you have questions after your visit or need to cancel or reschedule your appointment, please contact [facility] - [facility] DE[facility]  Dept: [phone]  and follow the prompts.  Office hours are 8:00 a.m. to 4:30 p.m. Monday - Friday. Please note that voicemails left after 4:00 p.m. may not be returned until the following business day.  We are closed weekends and major holidays. You have access to a nurse at all times for urgent questions. Please call the main number to the clinic Dept: [phone] and follow the prompts.   For any non-urgent questions, you may also contact your provider using MyChart. We now offer e-Visits for anyone 5 and older to request care online for non-urgent symptoms. For details visit [email or web address].   Also download the MyChart app! Go to the app store, search "MyChart", open the app, select [facility], and log in with your MyChart username and password.

## 2024-05-03 ENCOUNTER — Other Ambulatory Visit: Payer: Self-pay | Admitting: Hematology

## 2024-05-04 ENCOUNTER — Encounter: Payer: Self-pay | Admitting: Hematology

## 2024-05-04 LAB — MULTIPLE MYELOMA PANEL, SERUM
Albumin SerPl Elph-Mcnc: 3.4 g/dL (ref 2.9–4.4)
Albumin/Glob SerPl: 1.2 (ref 0.7–1.7)
Alpha 1: 0.2 g/dL (ref 0.0–0.4)
Alpha2 Glob SerPl Elph-Mcnc: 1.2 g/dL — ABNORMAL HIGH (ref 0.4–1.0)
B-Globulin SerPl Elph-Mcnc: 1.1 g/dL (ref 0.7–1.3)
Gamma Glob SerPl Elph-Mcnc: 0.4 g/dL (ref 0.4–1.8)
Globulin, Total: 2.9 g/dL (ref 2.2–3.9)
IgA: 68 mg/dL (ref 64–422)
IgG (Immunoglobin G), Serum: 436 mg/dL — ABNORMAL LOW (ref 586–1602)
IgM (Immunoglobulin M), Srm: 41 mg/dL (ref 26–217)
Total Protein ELP: 6.3 g/dL (ref 6.0–8.5)

## 2024-05-05 ENCOUNTER — Other Ambulatory Visit: Payer: Self-pay

## 2024-05-05 DIAGNOSIS — C9000 Multiple myeloma not having achieved remission: Secondary | ICD-10-CM

## 2024-05-07 ENCOUNTER — Encounter: Payer: Self-pay | Admitting: Hematology

## 2024-05-07 ENCOUNTER — Inpatient Hospital Stay

## 2024-05-07 DIAGNOSIS — C9000 Multiple myeloma not having achieved remission: Secondary | ICD-10-CM

## 2024-05-07 DIAGNOSIS — Z5112 Encounter for antineoplastic immunotherapy: Secondary | ICD-10-CM | POA: Diagnosis not present

## 2024-05-07 DIAGNOSIS — C7951 Secondary malignant neoplasm of bone: Secondary | ICD-10-CM

## 2024-05-07 LAB — CBC WITH DIFFERENTIAL (CANCER CENTER ONLY)
Abs Immature Granulocytes: 0.01 K/uL (ref 0.00–0.07)
Basophils Absolute: 0 K/uL (ref 0.0–0.1)
Basophils Relative: 1 %
Eosinophils Absolute: 0.1 K/uL (ref 0.0–0.5)
Eosinophils Relative: 1 %
HCT: 30.7 % — ABNORMAL LOW (ref 36.0–46.0)
Hemoglobin: 10.3 g/dL — ABNORMAL LOW (ref 12.0–15.0)
Immature Granulocytes: 0 %
Lymphocytes Relative: 40 %
Lymphs Abs: 2.3 K/uL (ref 0.7–4.0)
MCH: 30.8 pg (ref 26.0–34.0)
MCHC: 33.6 g/dL (ref 30.0–36.0)
MCV: 91.9 fL (ref 80.0–100.0)
Monocytes Absolute: 0.4 K/uL (ref 0.1–1.0)
Monocytes Relative: 7 %
Neutro Abs: 3 K/uL (ref 1.7–7.7)
Neutrophils Relative %: 51 %
Platelet Count: 272 K/uL (ref 150–400)
RBC: 3.34 MIL/uL — ABNORMAL LOW (ref 3.87–5.11)
RDW: 13.5 % (ref 11.5–15.5)
WBC Count: 5.8 K/uL (ref 4.0–10.5)
nRBC: 0 % (ref 0.0–0.2)

## 2024-05-07 LAB — CMP (CANCER CENTER ONLY)
ALT: 10 U/L (ref 0–44)
AST: 14 U/L — ABNORMAL LOW (ref 15–41)
Albumin: 4.2 g/dL (ref 3.5–5.0)
Alkaline Phosphatase: 66 U/L (ref 38–126)
Anion gap: 9 (ref 5–15)
BUN: 15 mg/dL (ref 8–23)
CO2: 27 mmol/L (ref 22–32)
Calcium: 9.4 mg/dL (ref 8.9–10.3)
Chloride: 108 mmol/L (ref 98–111)
Creatinine: 1.34 mg/dL — ABNORMAL HIGH (ref 0.44–1.00)
GFR, Estimated: 40 mL/min — ABNORMAL LOW (ref >60)
Glucose, Bld: 125 mg/dL — ABNORMAL HIGH (ref 70–99)
Potassium: 4 mmol/L (ref 3.5–5.1)
Sodium: 144 mmol/L (ref 135–145)
Total Bilirubin: 0.3 mg/dL (ref 0.0–1.2)
Total Protein: 6.4 g/dL — ABNORMAL LOW (ref 6.5–8.1)

## 2024-05-07 MED ORDER — SODIUM CHLORIDE 0.9% FLUSH: 10.0000 mL | Freq: Once | INTRAVENOUS | Status: DC

## 2024-05-07 MED ORDER — SODIUM CHLORIDE 0.9 % IV SOLN
60.0000 mg | Freq: Once | INTRAVENOUS | Status: AC
  Administered 2024-05-07: 60 mg via INTRAVENOUS
  Filled 2024-05-07: qty 10

## 2024-05-07 MED ORDER — SODIUM CHLORIDE 0.9 % IV SOLN: INTRAVENOUS | Status: DC

## 2024-05-17 ENCOUNTER — Telehealth: Payer: Self-pay | Admitting: Diagnostic Neuroimaging

## 2024-05-17 ENCOUNTER — Ambulatory Visit: Payer: Self-pay

## 2024-05-17 DIAGNOSIS — G629 Polyneuropathy, unspecified: Secondary | ICD-10-CM

## 2024-05-17 NOTE — Telephone Encounter (Signed)
 I called pt back.  She has been having worsening neuropathy fingers/ feet.  Burning pain.  Taking gabapentin  now 600mg  po tid with Dr. Mahlon prescribing.  She has tried lidocaine  patch 5%, not working.  Using cane/walker.  See's Dr. Mahlon this week.  Reading other medications OTC she had not tried alpha lipoic 600mg  po daily.  (She said will try that in addition).  Did not really want to go up on gabapentin .  I told her that hopefully cancellation will come up.  She is on waitlist.  FYI

## 2024-05-17 NOTE — Telephone Encounter (Signed)
 FYI Only or Action Required?: FYI only for provider.  Patient was last seen in primary care on 01/05/2024 by Mahlon Comer BRAVO, MD.  Called Nurse Triage reporting Neurologic Problem.  Symptoms began x 2 weeks.  Interventions attempted: Prescription medications: Gabapentin .  Symptoms are: gradually worsening.  Triage Disposition: See HCP Within 4 Hours (Or PCP Triage)  Patient/caregiver understands and will follow disposition?: Yes  **Appt. Scheduled 10/23 based on patient preference. **            Copied from CRM [id number]. Topic: Clinical - Red Word Triage >> May 17, 2024 12:47 PM Drema MATSU wrote: Red Word that prompted transfer to Nurse Triage: Patient is has pain in her hands and feet from her neuropathy and a slight headache.   ----------------------------------------------------------------------- From previous Reason for Contact - Scheduling: Patient/patient representative is calling to schedule an appointment. Refer to attachments for appointment information. Reason for Disposition  [1] SEVERE pain (e.g., excruciating, unable to do any normal activities) AND [2] not improved after 2 hours of pain medicine  Answer Assessment - Initial Assessment Questions 1. ONSET: When did the pain start?       Ongoing, patient has neuropathy since 2023, progressively getting worse x 2 weeks    2. LOCATION: Where is the pain located?      BIL feet, mainly left. BIL hands  3. PAIN: How bad is the pain?    (Scale 1-10; or mild, moderate, severe)     BIL foot pain 8/10, BIL hands 10/10   4. WORK OR EXERCISE: Has there been any recent work or exercise that involved this part of the body?      No   5. CAUSE: What do you think is causing the foot pain?     neuropathy  6. OTHER SYMPTOMS: Do you have any other symptoms? (e.g., leg pain, rash, fever, numbness)  No    Patient reports left leg swelling x 2 days ago, ankle and calf. No redness noted or warmth to  the touch. Patient is taking Gabapentin . Patient is being followed by specialist but cannot get in to them until December. Appt. Scheduled 10/23 based on patient preference.  Protocols used: Foot Pain-A-AH

## 2024-05-17 NOTE — Telephone Encounter (Signed)
 Patient said having pain in feet can hardly walk, and pain in hands. Gabapentin  is not really working. Would like a call from the nurse to discuss a getting a appointment this year.  Patient schedule appt on 12/2024

## 2024-05-17 NOTE — Telephone Encounter (Signed)
 Should be ok for 10/23 unless symptoms change or worsen

## 2024-05-18 NOTE — Telephone Encounter (Signed)
 I called pt and relayed that from neuro standpoint Dr. Margaret did not have any other suggestions other then pain management referral.  She would like him to refer if he would.

## 2024-05-20 ENCOUNTER — Ambulatory Visit (INDEPENDENT_AMBULATORY_CARE_PROVIDER_SITE_OTHER): Admitting: Family Medicine

## 2024-05-20 ENCOUNTER — Encounter: Payer: Self-pay | Admitting: Family Medicine

## 2024-05-20 VITALS — BP 128/74 | HR 82 | Temp 98.0°F | Resp 15 | Ht 60.0 in | Wt 191.0 lb

## 2024-05-20 DIAGNOSIS — M19032 Primary osteoarthritis, left wrist: Secondary | ICD-10-CM | POA: Diagnosis not present

## 2024-05-20 DIAGNOSIS — E1140 Type 2 diabetes mellitus with diabetic neuropathy, unspecified: Secondary | ICD-10-CM | POA: Diagnosis not present

## 2024-05-20 DIAGNOSIS — M79645 Pain in left finger(s): Secondary | ICD-10-CM | POA: Diagnosis not present

## 2024-05-20 LAB — TSH: TSH: 2.94 u[IU]/mL (ref 0.35–5.50)

## 2024-05-20 LAB — HEPATIC FUNCTION PANEL
ALT: 12 U/L (ref 0–35)
AST: 17 U/L (ref 0–37)
Albumin: 4.3 g/dL (ref 3.5–5.2)
Alkaline Phosphatase: 68 U/L (ref 39–117)
Bilirubin, Direct: 0.1 mg/dL (ref 0.0–0.3)
Total Bilirubin: 0.3 mg/dL (ref 0.2–1.2)
Total Protein: 6.7 g/dL (ref 6.0–8.3)

## 2024-05-20 LAB — BASIC METABOLIC PANEL WITH GFR
BUN: 17 mg/dL (ref 6–23)
CO2: 27 meq/L (ref 19–32)
Calcium: 9.1 mg/dL (ref 8.4–10.5)
Chloride: 103 meq/L (ref 96–112)
Creatinine, Ser: 1.32 mg/dL — ABNORMAL HIGH (ref 0.40–1.20)
GFR: 38.23 mL/min — ABNORMAL LOW (ref >60.00)
Glucose, Bld: 124 mg/dL — ABNORMAL HIGH (ref 70–99)
Potassium: 4.4 meq/L (ref 3.5–5.1)
Sodium: 142 meq/L (ref 135–145)

## 2024-05-20 LAB — CBC WITH DIFFERENTIAL/PLATELET
Basophils Absolute: 0 K/uL (ref 0.0–0.1)
Basophils Relative: 0.7 % (ref 0.0–3.0)
Eosinophils Absolute: 0.1 K/uL (ref 0.0–0.7)
Eosinophils Relative: 2 % (ref 0.0–5.0)
HCT: 34.3 % — ABNORMAL LOW (ref 36.0–46.0)
Hemoglobin: 11.3 g/dL — ABNORMAL LOW (ref 12.0–15.0)
Lymphocytes Relative: 44.8 % (ref 12.0–46.0)
Lymphs Abs: 2.4 K/uL (ref 0.7–4.0)
MCHC: 33 g/dL (ref 30.0–36.0)
MCV: 95.5 fl (ref 78.0–100.0)
Monocytes Absolute: 0.4 K/uL (ref 0.1–1.0)
Monocytes Relative: 6.8 % (ref 3.0–12.0)
Neutro Abs: 2.5 K/uL (ref 1.4–7.7)
Neutrophils Relative %: 45.7 % (ref 43.0–77.0)
Platelets: 262 K/uL (ref 150.0–400.0)
RBC: 3.59 Mil/uL — ABNORMAL LOW (ref 3.87–5.11)
RDW: 14.3 % (ref 11.5–15.5)
WBC: 5.4 K/uL (ref 4.0–10.5)

## 2024-05-20 LAB — LIPID PANEL
Cholesterol: 200 mg/dL (ref 0–200)
HDL: 65.8 mg/dL (ref >39.00)
LDL Cholesterol: 101 mg/dL — ABNORMAL HIGH (ref 0–99)
NonHDL: 134.67
Total CHOL/HDL Ratio: 3
Triglycerides: 167 mg/dL — ABNORMAL HIGH (ref 0.0–149.0)
VLDL: 33.4 mg/dL (ref 0.0–40.0)

## 2024-05-20 LAB — HEMOGLOBIN A1C: Hgb A1c MFr Bld: 7.2 % — ABNORMAL HIGH (ref 4.6–6.5)

## 2024-05-20 MED ORDER — PREGABALIN 150 MG PO CAPS: 150.0000 mg | ORAL_CAPSULE | Freq: Two times a day (BID) | ORAL | 1 refills | Status: AC

## 2024-05-20 NOTE — Patient Instructions (Signed)
 Follow up in 6 weeks to recheck neuropathy We'll notify you of your lab results and make any changes if needed Once you receive the Lyrica  in the mail, STOP the Gabapentin  and START the Lyrica  twice daily You do NOT need to wean off the Gabapentin , you can just switch We'll call you to schedule your hand specialist appt Call with any questions or concerns Hang in there!!!

## 2024-05-20 NOTE — Assessment & Plan Note (Signed)
 Deteriorated.  Pt reports neuropathy sxs are worsening.  At this point, she feels gabapentin  isn't helping the pain and the higher doses are making her 'wobbly'.  Will switch to Lyrica  to see if pain improves.  At next visit may consider starting Cymbalta for additional relief of nerve pain.  Check labs.  Will follow.

## 2024-05-20 NOTE — Progress Notes (Signed)
   Subjective:    Patient ID: Hudson DELENA Flesher, female    DOB: 1946-03-09, 78 y.o.   MRN: [id number]  HPI Neuropathy- ongoing issue.  Currently on Gabapentin  600mg  TID.  Feels that gabapentin  is making her 'wobbly' and not helping w/ pain.  Pt reports hands are 'really bad' but also having sxs in her feet.  Having a hard time using her hands- particularly fine motor tasks like writing.  Having significant L thumb pain- CMC joint and MCP joint.   Review of Systems For ROS see HPI     Objective:   Physical Exam Vitals reviewed.  Constitutional:      General: She is not in acute distress.    Appearance: Normal appearance. She is not ill-appearing.  HENT:     Head: Normocephalic and atraumatic.  Eyes:     Extraocular Movements: Extraocular movements intact.     Conjunctiva/sclera: Conjunctivae normal.  Cardiovascular:     Pulses: Normal pulses.  Musculoskeletal:        General: Tenderness (TTP over L CMC and 1st MCP joint) present.  Skin:    General: Skin is warm and dry.     Findings: No bruising, erythema or rash.  Neurological:     Mental Status: She is alert and oriented to person, place, and time. Mental status is at baseline.  Psychiatric:        Mood and Affect: Mood normal.        Behavior: Behavior normal.        Thought Content: Thought content normal.           Assessment & Plan:  L thumb pain- new.  Pt is having pain of CMC and MCP joints.  Will refer to hand specialist for complete evaluation.  Pt expressed understanding and is in agreement w/ plan.

## 2024-05-21 ENCOUNTER — Ambulatory Visit: Payer: Self-pay | Admitting: Family Medicine

## 2024-05-24 ENCOUNTER — Encounter: Payer: Self-pay | Admitting: Hematology

## 2024-05-24 ENCOUNTER — Telehealth: Payer: Self-pay

## 2024-05-24 ENCOUNTER — Other Ambulatory Visit: Payer: Self-pay

## 2024-05-24 NOTE — Telephone Encounter (Signed)
 Copied from CRM [id number]. Topic: Clinical - Prescription Issue >> May 24, 2024 11:31 AM Rea ORN wrote: Reason for CRM: Pt stated the [facility] Delivery advised her that the rx is incomplete for pregabalin  (LYRICA ) 150 MG capsule. She is asking for this to be completed and sent back in with all of her medications. She stated all of her meds should be ready for refill and wants everything sent in.   Please call [phone]

## 2024-05-24 NOTE — Telephone Encounter (Signed)
 Please advise .  Sent in to pharmacy on file 05/20/2024

## 2024-05-24 NOTE — Telephone Encounter (Signed)
 Called [facility]  Pharmacy asked if pt had stopped taking gabapentin  (NEURONTIN ) 100 MG  Per chart this was discontinued. Relayed this information to pharmacist

## 2024-05-24 NOTE — Telephone Encounter (Signed)
 This prescription was sent to [facility] on 10/23.  I'm not sure what they mean by 'rx is incomplete'.  Can we please call the pharmacy and find out what they need from us  to fill this medication?  Also, she gets medications from multiple different physicians.  I need to know which medications she needs so that I can fill them if I prescribe them.  'all meds should be ready for refill, send everything' is not helpful.

## 2024-05-26 ENCOUNTER — Telehealth: Payer: Self-pay

## 2024-05-26 ENCOUNTER — Other Ambulatory Visit: Payer: Self-pay | Admitting: Family Medicine

## 2024-05-26 NOTE — Telephone Encounter (Signed)
 Copied from CRM [id number]. Topic: Clinical - Medication Question >> May 26, 2024  3:05 PM Tracy [name] wrote: Reason for CRM: Patient needs to know if medication has been sent in for allergy but patient does not know name of medication. And requests a return call.

## 2024-05-26 NOTE — Telephone Encounter (Signed)
 Error

## 2024-05-26 NOTE — Telephone Encounter (Unsigned)
 Copied from CRM [id number]. Topic: Clinical - Prescription Issue >> May 24, 2024 11:31 AM Rea ORN wrote: Reason for CRM: Pt stated the [facility] Delivery advised her that the rx is incomplete for pregabalin  (LYRICA ) 150 MG capsule. She is asking for this to be completed and sent back in with all of her medications. She stated all of her meds should be ready for refill and wants everything sent in.   Please call [phone] >> May 26, 2024 12:56 PM [name] wrote: Patient called in would like  a nurse to followup with this , as she still hasn't heard anything about her prescriptions

## 2024-05-26 NOTE — Telephone Encounter (Signed)
 Called patient and let her know the Xyzal  has been refilled today. Patient verbalized understanding.

## 2024-05-28 ENCOUNTER — Other Ambulatory Visit: Payer: Self-pay

## 2024-05-28 ENCOUNTER — Inpatient Hospital Stay

## 2024-05-28 ENCOUNTER — Encounter: Payer: Self-pay | Admitting: Hematology

## 2024-05-28 VITALS — BP 148/64 | HR 75 | Temp 98.3°F | Resp 16

## 2024-05-28 DIAGNOSIS — Z7189 Other specified counseling: Secondary | ICD-10-CM

## 2024-05-28 DIAGNOSIS — Z5112 Encounter for antineoplastic immunotherapy: Secondary | ICD-10-CM | POA: Diagnosis not present

## 2024-05-28 DIAGNOSIS — C7951 Secondary malignant neoplasm of bone: Secondary | ICD-10-CM

## 2024-05-28 DIAGNOSIS — C9000 Multiple myeloma not having achieved remission: Secondary | ICD-10-CM

## 2024-05-28 LAB — CMP (CANCER CENTER ONLY)
ALT: 12 U/L (ref 0–44)
AST: 13 U/L — ABNORMAL LOW (ref 15–41)
Albumin: 4.4 g/dL (ref 3.5–5.0)
Alkaline Phosphatase: 68 U/L (ref 38–126)
Anion gap: 10 (ref 5–15)
BUN: 14 mg/dL (ref 8–23)
CO2: 25 mmol/L (ref 22–32)
Calcium: 9.8 mg/dL (ref 8.9–10.3)
Chloride: 106 mmol/L (ref 98–111)
Creatinine: 1.31 mg/dL — ABNORMAL HIGH (ref 0.44–1.00)
GFR, Estimated: 41 mL/min — ABNORMAL LOW (ref >60)
Glucose, Bld: 141 mg/dL — ABNORMAL HIGH (ref 70–99)
Potassium: 4.5 mmol/L (ref 3.5–5.1)
Sodium: 141 mmol/L (ref 135–145)
Total Bilirubin: 0.3 mg/dL (ref 0.0–1.2)
Total Protein: 7 g/dL (ref 6.5–8.1)

## 2024-05-28 LAB — CBC WITH DIFFERENTIAL (CANCER CENTER ONLY)
Abs Immature Granulocytes: 0.04 K/uL (ref 0.00–0.07)
Basophils Absolute: 0 K/uL (ref 0.0–0.1)
Basophils Relative: 0 %
Eosinophils Absolute: 0 K/uL (ref 0.0–0.5)
Eosinophils Relative: 0 %
HCT: 30.8 % — ABNORMAL LOW (ref 36.0–46.0)
Hemoglobin: 10.5 g/dL — ABNORMAL LOW (ref 12.0–15.0)
Immature Granulocytes: 1 %
Lymphocytes Relative: 27 %
Lymphs Abs: 2.3 K/uL (ref 0.7–4.0)
MCH: 31.3 pg (ref 26.0–34.0)
MCHC: 34.1 g/dL (ref 30.0–36.0)
MCV: 91.9 fL (ref 80.0–100.0)
Monocytes Absolute: 0.4 K/uL (ref 0.1–1.0)
Monocytes Relative: 5 %
Neutro Abs: 5.8 K/uL (ref 1.7–7.7)
Neutrophils Relative %: 67 %
Platelet Count: 314 K/uL (ref 150–400)
RBC: 3.35 MIL/uL — ABNORMAL LOW (ref 3.87–5.11)
RDW: 13.2 % (ref 11.5–15.5)
WBC Count: 8.5 K/uL (ref 4.0–10.5)
nRBC: 0 % (ref 0.0–0.2)

## 2024-05-28 MED ORDER — DIPHENHYDRAMINE HCL 25 MG PO CAPS
50.0000 mg | ORAL_CAPSULE | Freq: Once | ORAL | Status: AC
  Administered 2024-05-28: 50 mg via ORAL
  Filled 2024-05-28: qty 2

## 2024-05-28 MED ORDER — CYANOCOBALAMIN 1000 MCG/ML IJ SOLN
1000.0000 ug | Freq: Once | INTRAMUSCULAR | Status: AC
  Administered 2024-05-28: 1000 ug via INTRAMUSCULAR
  Filled 2024-05-28: qty 1

## 2024-05-28 MED ORDER — SODIUM CHLORIDE 0.9 % IV SOLN
12.0000 mg | Freq: Once | INTRAVENOUS | Status: AC
  Administered 2024-05-28: 12 mg via INTRAVENOUS
  Filled 2024-05-28: qty 1.2

## 2024-05-28 MED ORDER — SODIUM CHLORIDE 0.9 % IV SOLN
16.0000 mg/kg | Freq: Once | INTRAVENOUS | Status: AC
  Administered 2024-05-28: 1300 mg via INTRAVENOUS
  Filled 2024-05-28: qty 5

## 2024-05-28 MED ORDER — FAMOTIDINE IN NACL 20-0.9 MG/50ML-% IV SOLN
20.0000 mg | Freq: Once | INTRAVENOUS | Status: AC
  Administered 2024-05-28: 20 mg via INTRAVENOUS
  Filled 2024-05-28: qty 50

## 2024-05-28 MED ORDER — ACETAMINOPHEN 500 MG PO TABS
1000.0000 mg | ORAL_TABLET | Freq: Once | ORAL | Status: AC
  Administered 2024-05-28: 1000 mg via ORAL
  Filled 2024-05-28: qty 2

## 2024-05-28 MED ORDER — SODIUM CHLORIDE 0.9 % IV SOLN: Freq: Once | INTRAVENOUS | Status: AC

## 2024-05-28 MED ORDER — SODIUM CHLORIDE 0.9% FLUSH: 10.0000 mL | INTRAVENOUS | Status: DC | PRN

## 2024-05-28 NOTE — Patient Instructions (Signed)
 [facility] - A DEPT OF [facility]   Discharge Instructions: Thank you for choosing [facility] to provide your oncology and hematology care.   If you have a lab appointment with the Cancer Center, please go directly to the Cancer Center and check in at the registration area.   Wear comfortable clothing and clothing appropriate for easy access to any Portacath or PICC line.   We strive to give you quality time with your provider. You may need to reschedule your appointment if you arrive late (15 or more minutes).  Arriving late affects you and other patients whose appointments are after yours.  Also, if you miss three or more appointments without notifying the office, you may be dismissed from the clinic at the provider's discretion.      For prescription refill requests, have your pharmacy contact our office and allow 72 hours for refills to be completed.    Today you received the following chemotherapy and/or immunotherapy agents: Daratumumab (Darzalex)      To help prevent nausea and vomiting after your treatment, we encourage you to take your nausea medication as directed.  BELOW ARE SYMPTOMS THAT SHOULD BE REPORTED IMMEDIATELY: *FEVER GREATER THAN 100.4 F (38 C) OR HIGHER *CHILLS OR SWEATING *NAUSEA AND VOMITING THAT IS NOT CONTROLLED WITH YOUR NAUSEA MEDICATION *UNUSUAL SHORTNESS OF BREATH *UNUSUAL BRUISING OR BLEEDING *URINARY PROBLEMS (pain or burning when urinating, or frequent urination) *BOWEL PROBLEMS (unusual diarrhea, constipation, pain near the anus) TENDERNESS IN MOUTH AND THROAT WITH OR WITHOUT PRESENCE OF ULCERS (sore throat, sores in mouth, or a toothache) UNUSUAL RASH, SWELLING OR PAIN  UNUSUAL VAGINAL DISCHARGE OR ITCHING   Items with * indicate a potential emergency and should be followed up as soon as possible or go to the Emergency Department if any problems should occur.  Please show the CHEMOTHERAPY ALERT CARD or  IMMUNOTHERAPY ALERT CARD at check-in to the Emergency Department and triage nurse.  Should you have questions after your visit or need to cancel or reschedule your appointment, please contact [facility] - [facility] DE[facility]  Dept: [phone]  and follow the prompts.  Office hours are 8:00 a.m. to 4:30 p.m. Monday - Friday. Please note that voicemails left after 4:00 p.m. may not be returned until the following business day.  We are closed weekends and major holidays. You have access to a nurse at all times for urgent questions. Please call the main number to the clinic Dept: [phone] and follow the prompts.   For any non-urgent questions, you may also contact your provider using MyChart. We now offer e-Visits for anyone 5 and older to request care online for non-urgent symptoms. For details visit [email or web address].   Also download the MyChart app! Go to the app store, search "MyChart", open the app, select [facility], and log in with your MyChart username and password.

## 2024-05-31 ENCOUNTER — Telehealth: Payer: Self-pay | Admitting: Hematology

## 2024-06-03 LAB — MULTIPLE MYELOMA PANEL, SERUM
Albumin SerPl Elph-Mcnc: 3.6 g/dL (ref 2.9–4.4)
Albumin/Glob SerPl: 1.2 (ref 0.7–1.7)
Alpha 1: 0.3 g/dL (ref 0.0–0.4)
Alpha2 Glob SerPl Elph-Mcnc: 1.2 g/dL — ABNORMAL HIGH (ref 0.4–1.0)
B-Globulin SerPl Elph-Mcnc: 1.2 g/dL (ref 0.7–1.3)
Gamma Glob SerPl Elph-Mcnc: 0.5 g/dL (ref 0.4–1.8)
Globulin, Total: 3.1 g/dL (ref 2.2–3.9)
IgA: 75 mg/dL (ref 64–422)
IgG (Immunoglobin G), Serum: 469 mg/dL — ABNORMAL LOW (ref 586–1602)
IgM (Immunoglobulin M), Srm: 46 mg/dL (ref 26–217)
Total Protein ELP: 6.7 g/dL (ref 6.0–8.5)

## 2024-06-04 ENCOUNTER — Telehealth: Payer: Self-pay | Admitting: Family Medicine

## 2024-06-04 NOTE — Telephone Encounter (Signed)
 Pt is requesting an increase to Lyrica  Rx as she notes it is not as effective as Gabapentin 

## 2024-06-04 NOTE — Telephone Encounter (Signed)
 Copied from CRM #[id number]. Topic: Clinical - Medical Advice >> Jun 04, 2024  1:08 PM [name] wrote: Reason for CRM: Patient called in lyrica  isnt helping her as much as the gabapentin  wanted to know if it could be increased in anyway, patient would like a callback on what she needs to do for it  [phone]

## 2024-06-07 ENCOUNTER — Encounter: Payer: Self-pay | Admitting: Physical Medicine and Rehabilitation

## 2024-06-09 ENCOUNTER — Other Ambulatory Visit: Payer: Self-pay | Admitting: Family Medicine

## 2024-06-09 DIAGNOSIS — E785 Hyperlipidemia, unspecified: Secondary | ICD-10-CM

## 2024-06-09 NOTE — Telephone Encounter (Signed)
 She is currently at the max dose of Lyrica  that her renal function and age will allow.  If this is not as effective as the gabapentin , would she like to switch back?

## 2024-06-09 NOTE — Telephone Encounter (Signed)
 Pt is going to try and stick it out and see if it will help but will call back if she would like to switch back

## 2024-06-09 NOTE — Telephone Encounter (Signed)
 At this time, I'm not comfortable increasing the Lyrica .  She can give it a bit more time to see if it starts to provide better relief, but otherwise, we'll need to switch back or try something different.

## 2024-06-09 NOTE — Telephone Encounter (Signed)
 Pt wondering if she could take one additional at night of lyrica ? Otherwise she will try it a little bit longer but without improvement she will likely switch back if that is acceptable.

## 2024-06-10 NOTE — Telephone Encounter (Signed)
 Pt called and lvm to return call

## 2024-06-11 NOTE — Telephone Encounter (Signed)
 Pt has been informed and she will call back if she wants to go back to the gabapentin .

## 2024-06-21 MED FILL — Dexamethasone Sodium Phosphate Inj 100 MG/10ML: INTRAMUSCULAR | Qty: 1.2 | Status: AC

## 2024-06-22 ENCOUNTER — Inpatient Hospital Stay: Attending: Hematology

## 2024-06-22 ENCOUNTER — Inpatient Hospital Stay: Admitting: Hematology

## 2024-06-22 ENCOUNTER — Inpatient Hospital Stay

## 2024-06-22 ENCOUNTER — Encounter: Payer: Self-pay | Admitting: Hematology

## 2024-06-22 VITALS — BP 155/61 | HR 75 | Temp 97.8°F | Resp 16 | Wt 191.4 lb

## 2024-06-22 DIAGNOSIS — Z8673 Personal history of transient ischemic attack (TIA), and cerebral infarction without residual deficits: Secondary | ICD-10-CM | POA: Insufficient documentation

## 2024-06-22 DIAGNOSIS — Z853 Personal history of malignant neoplasm of breast: Secondary | ICD-10-CM | POA: Diagnosis not present

## 2024-06-22 DIAGNOSIS — Z7189 Other specified counseling: Secondary | ICD-10-CM

## 2024-06-22 DIAGNOSIS — N189 Chronic kidney disease, unspecified: Secondary | ICD-10-CM | POA: Insufficient documentation

## 2024-06-22 DIAGNOSIS — I129 Hypertensive chronic kidney disease with stage 1 through stage 4 chronic kidney disease, or unspecified chronic kidney disease: Secondary | ICD-10-CM | POA: Insufficient documentation

## 2024-06-22 DIAGNOSIS — R053 Chronic cough: Secondary | ICD-10-CM | POA: Insufficient documentation

## 2024-06-22 DIAGNOSIS — Z800 Family history of malignant neoplasm of digestive organs: Secondary | ICD-10-CM | POA: Insufficient documentation

## 2024-06-22 DIAGNOSIS — Z5112 Encounter for antineoplastic immunotherapy: Secondary | ICD-10-CM | POA: Diagnosis present

## 2024-06-22 DIAGNOSIS — C9000 Multiple myeloma not having achieved remission: Secondary | ICD-10-CM | POA: Insufficient documentation

## 2024-06-22 DIAGNOSIS — E1140 Type 2 diabetes mellitus with diabetic neuropathy, unspecified: Secondary | ICD-10-CM | POA: Diagnosis not present

## 2024-06-22 DIAGNOSIS — E1122 Type 2 diabetes mellitus with diabetic chronic kidney disease: Secondary | ICD-10-CM | POA: Insufficient documentation

## 2024-06-22 DIAGNOSIS — Z79899 Other long term (current) drug therapy: Secondary | ICD-10-CM | POA: Insufficient documentation

## 2024-06-22 DIAGNOSIS — I252 Old myocardial infarction: Secondary | ICD-10-CM | POA: Insufficient documentation

## 2024-06-22 DIAGNOSIS — E538 Deficiency of other specified B group vitamins: Secondary | ICD-10-CM | POA: Diagnosis not present

## 2024-06-22 DIAGNOSIS — Z803 Family history of malignant neoplasm of breast: Secondary | ICD-10-CM | POA: Insufficient documentation

## 2024-06-22 DIAGNOSIS — I2510 Atherosclerotic heart disease of native coronary artery without angina pectoris: Secondary | ICD-10-CM | POA: Insufficient documentation

## 2024-06-22 DIAGNOSIS — C7951 Secondary malignant neoplasm of bone: Secondary | ICD-10-CM | POA: Diagnosis not present

## 2024-06-22 DIAGNOSIS — Z79624 Long term (current) use of inhibitors of nucleotide synthesis: Secondary | ICD-10-CM | POA: Insufficient documentation

## 2024-06-22 DIAGNOSIS — Z90710 Acquired absence of both cervix and uterus: Secondary | ICD-10-CM | POA: Insufficient documentation

## 2024-06-22 DIAGNOSIS — Z87891 Personal history of nicotine dependence: Secondary | ICD-10-CM | POA: Insufficient documentation

## 2024-06-22 DIAGNOSIS — Z923 Personal history of irradiation: Secondary | ICD-10-CM | POA: Insufficient documentation

## 2024-06-22 DIAGNOSIS — Z95828 Presence of other vascular implants and grafts: Secondary | ICD-10-CM

## 2024-06-22 DIAGNOSIS — Z801 Family history of malignant neoplasm of trachea, bronchus and lung: Secondary | ICD-10-CM | POA: Diagnosis not present

## 2024-06-22 LAB — CBC WITH DIFFERENTIAL (CANCER CENTER ONLY)
Abs Immature Granulocytes: 0.01 K/uL (ref 0.00–0.07)
Basophils Absolute: 0 K/uL (ref 0.0–0.1)
Basophils Relative: 1 %
Eosinophils Absolute: 0.1 K/uL (ref 0.0–0.5)
Eosinophils Relative: 2 %
HCT: 30.1 % — ABNORMAL LOW (ref 36.0–46.0)
Hemoglobin: 10 g/dL — ABNORMAL LOW (ref 12.0–15.0)
Immature Granulocytes: 0 %
Lymphocytes Relative: 48 %
Lymphs Abs: 2.4 K/uL (ref 0.7–4.0)
MCH: 31.5 pg (ref 26.0–34.0)
MCHC: 33.2 g/dL (ref 30.0–36.0)
MCV: 95 fL (ref 80.0–100.0)
Monocytes Absolute: 0.3 K/uL (ref 0.1–1.0)
Monocytes Relative: 7 %
Neutro Abs: 2.1 K/uL (ref 1.7–7.7)
Neutrophils Relative %: 42 %
Platelet Count: 248 K/uL (ref 150–400)
RBC: 3.17 MIL/uL — ABNORMAL LOW (ref 3.87–5.11)
RDW: 13.2 % (ref 11.5–15.5)
WBC Count: 4.9 K/uL (ref 4.0–10.5)
nRBC: 0 % (ref 0.0–0.2)

## 2024-06-22 LAB — CMP (CANCER CENTER ONLY)
ALT: 14 U/L (ref 0–44)
AST: 20 U/L (ref 15–41)
Albumin: 4.2 g/dL (ref 3.5–5.0)
Alkaline Phosphatase: 76 U/L (ref 38–126)
Anion gap: 12 (ref 5–15)
BUN: 10 mg/dL (ref 8–23)
CO2: 22 mmol/L (ref 22–32)
Calcium: 8.8 mg/dL — ABNORMAL LOW (ref 8.9–10.3)
Chloride: 109 mmol/L (ref 98–111)
Creatinine: 1.19 mg/dL — ABNORMAL HIGH (ref 0.44–1.00)
GFR, Estimated: 46 mL/min — ABNORMAL LOW (ref >60)
Glucose, Bld: 124 mg/dL — ABNORMAL HIGH (ref 70–99)
Potassium: 4.4 mmol/L (ref 3.5–5.1)
Sodium: 143 mmol/L (ref 135–145)
Total Bilirubin: <0.2 mg/dL (ref 0.0–1.2)
Total Protein: 6.5 g/dL (ref 6.5–8.1)

## 2024-06-22 MED ORDER — SODIUM CHLORIDE 0.9% FLUSH
10.0000 mL | Freq: Once | INTRAVENOUS | Status: AC
  Administered 2024-06-22: 10 mL

## 2024-06-22 MED ORDER — DIPHENHYDRAMINE HCL 25 MG PO CAPS
50.0000 mg | ORAL_CAPSULE | Freq: Once | ORAL | Status: AC
  Administered 2024-06-22: 50 mg via ORAL
  Filled 2024-06-22: qty 2

## 2024-06-22 MED ORDER — SODIUM CHLORIDE 0.9 % IV SOLN: Freq: Once | INTRAVENOUS | Status: AC

## 2024-06-22 MED ORDER — SODIUM CHLORIDE 0.9 % IV SOLN
12.0000 mg | Freq: Once | INTRAVENOUS | Status: AC
  Administered 2024-06-22: 12 mg via INTRAVENOUS
  Filled 2024-06-22: qty 1.2

## 2024-06-22 MED ORDER — SODIUM CHLORIDE 0.9 % IV SOLN
16.0000 mg/kg | Freq: Once | INTRAVENOUS | Status: AC
  Administered 2024-06-22: 1300 mg via INTRAVENOUS
  Filled 2024-06-22: qty 5

## 2024-06-22 MED ORDER — ACETAMINOPHEN 500 MG PO TABS
1000.0000 mg | ORAL_TABLET | Freq: Once | ORAL | Status: AC
  Administered 2024-06-22: 1000 mg via ORAL
  Filled 2024-06-22: qty 2

## 2024-06-22 MED ORDER — CYANOCOBALAMIN 1000 MCG/ML IJ SOLN
1000.0000 ug | Freq: Once | INTRAMUSCULAR | Status: AC
  Administered 2024-06-22: 1000 ug via INTRAMUSCULAR
  Filled 2024-06-22: qty 1

## 2024-06-22 MED ORDER — FAMOTIDINE IN NACL 20-0.9 MG/50ML-% IV SOLN
20.0000 mg | Freq: Once | INTRAVENOUS | Status: AC
  Administered 2024-06-22: 20 mg via INTRAVENOUS
  Filled 2024-06-22: qty 50

## 2024-06-22 NOTE — Progress Notes (Signed)
 HEMATOLOGY ONCOLOGY PROGRESS NOTE  Date of service: 06/22/2024  Patient Care Team: Mahlon Comer BRAVO, MD as PCP - General (Family Medicine) Cesario Boer, MD as Attending Physician (Physical Medicine and Rehabilitation) Sheril Coy, MD as Consulting Physician (Orthopedic Surgery) Carlie Clark, MD as Consulting Physician (Otolaryngology) Luis Purchase, MD as Consulting Physician (Gastroenterology) Curvin Deward MOULD, MD as Consulting Physician (General Surgery)  CHIEF COMPLAINT/PURPOSE OF CONSULTATION: Follow-up for continued evaluation and management of multiple myeloma.  HISTORY OF PRESENTING ILLNESS:  (06/16/2020) Tracy [name] is a wonderful 78 y.o. female who has been referred to us  by Dr Odean for evaluation and management of Multiple Myeloma. Pt is accompanied today by her daughter in person and other daughter and granddaughter via phone. The pt reports that she is doing well overall.    When pt was first diagnosed with Breast Cancer it was localized in both of her breasts and was considered Stage 1. She then received chemotherapy. However, she declined chemotherapy a second time and chose radiation therapy after recurrence. Pt has been under the surveillance of Dr. Vinay Gudena after declining antiestrogen therapy.    The pt reports that she was having difficulty breathing so she saw her Pulmonologist, who ordered the work up. Based on the results of the PET/CT pt was sent to Dr. Odean as they were concerned that her breast cancer had spread. Pt has been becoming increasingly anemic over the last year, despite using a PO Iron  supplement. She has had a positive Fecal Occult tests, but has been having issues with hemorrhoids and hemorrhoidal bleeding for the last 5-6 months. Pt denies any blood in her stools but notices blood on the tissue after she wipes. Pt has also been having some discomfort in her left hip and lower back. This pain is worsened when she sits down.    Pt has had  a chronic cough that has been thought to either be Bronchitis or a result of acid reflux. Pt is planning to receive an Endoscopy to work this up further. She is currently taking Gabapentin  for tingling/burning in her extremities. Pt has Type II Diabetes and was previously using Metformin , but discontinued due to concern for liver damage. She is not currently using any medications to control her Diabetes.    Pt had a heart attack in 2001. She initially though that she had a [employer], until the sensation began to travel up her body. She was given Nitroglycerin upon admittance to the hospital. Pt did not require any stents or other interventions. No cause for her heart attack was ever discovered. Pt has been seen by Neurology, Dr. Margaret, who completed imaging on her neck and found a degeneratve disk. Pt has not had a nerve study conducted. Pt has no history of Shingles and has not received the Shingles vaccine. She has been fully vaccinated against he COVID19 virus.      Of note prior to the patient's visit today, pt has had PET/CT ([id number]) completed on 01/20/2020 with results revealing 1. Hypermetabolism corresponding to left acetabular and L5 lytic lesions, with differential considerations of metastatic disease or myeloma. 2. No evidence of hypermetabolic soft tissue primary, metastatic disease, or soft tissue myeloma. 3. Incidental findings, including: Aortic atherosclerosis (ICD10-I70.0) and emphysema (ICD10-J43.9). Hepatic steatosis.    Pt has had Bone Marrow Surgical Pathology ([id number]) completed on 01/17/2020 which revealed BONE MARROW, ASPIRATE, CLOT, CORE: -Hypercellular bone marrow for age with plasma cell neoplasm PERIPHERAL BLOOD: -Normocytic-normochromic anemia   Pt has had Left Ischium  Biopsy Surgical Pathology Report [id number]) completed on 01/17/2020 which revealed Plasma cell neoplasm.   Most recent lab results (01/25/2020) of CBC is as follows: all values are  WNL except for RBC at 2.44, Hgb at 7.5, HCT at 24.0, CO2 at 21, Glucose at 129, Creatinine at 1.38, Total Protein at 10.1, Albumin at 2.8, Total Bilirubin at <0.2, GFR Est Afr Am at 43. 01/25/2020 K/L light chains is as follows: Kappa free light chain at 22.7, Lamda free light chains at 17.7, K/L light chain ratio at 1.28 01/25/2020 MMP is as follows: all values are WNL except for IgG at 5220, Total Protein at 10.1, Alpha2 Glob at 1.2, Gamma Glob at 3.9, M Protein at 3.7, Total Globulin at 6.7, Albumin/Glob at 0.6. 01/25/2020 24-hr UPEP shows all values are WNL   On review of systems, pt reports lower back/left hip pain, unexpected weight loss, abdominal pain, loose stools and denies low appetite, constipation, rashes and any other symptoms.    On PMHx the pt reports Breast Cancer, Breast Lumpectomy, TIA, Restless leg, Myocardial infarction, HTN, HLD, Chronic Bronchitis.     SUMMARY OF ONCOLOGIC HISTORY: Oncology History  History of right breast cancer  04/16/2000 Surgery   Left breast: Triple negative  invasive ductal cancer treated with lumpectomy, adjuvant chemotherapy, radiation , in [place] , unknown stage   06/07/2015 Mammogram   Right breast mass 6x 6 x 5 mm, right axillary lymph node with slight cortex thickening measured 5 mm    06/13/2015 Initial Diagnosis   Right breast needle biopsy: Invasive ductal carcinoma, grade 1, right axillary lymph node biopsy negative , ER 95%, PR 5%, Ki-67 10%, HER-2 negative   06/13/2015 Clinical Stage   Stage IA: T1b N0   07/07/2015 Surgery   Right lumpectomy: Invasive ductal carcinoma grade 1, 1 cm span, with low-grade DCIS, DCIS focally 0.1 cm to inferior margin, 0/3 lymph nodes negative, ER 95%, PR 5%, HER-2 negative ratio 1.1, Ki-67 10%   07/07/2015 Pathologic Stage   Stage IA: T1c N0   07/13/2015 Procedure   Breast High/Moderate Risk Panel reveals no clinically significant variant at ATM, BRCA1, BRCA2, CDH1, CHEK2, PALB2, PTEN, and TP53.      08/23/2015 - 09/21/2015 Radiation Therapy   Adjuvant Radiation: Right breast/ 42.5Gy at 2.5 Gy per fraction x 17 fractions.   Right breast boost/ 7.5 Gy at 2.5 Gy per fraction x 3 fractions    Anti-estrogen oral therapy   Patient refused antiestrogen therapy   10/20/2015 Survivorship   Survivorship care plan completed and mailed to patient in lieu of in person visit at her request   Iron  deficiency anemia  Multiple myeloma (HCC)  01/25/2020 Initial Diagnosis   Multiple myeloma (HCC)   02/11/2020 - 05/19/2020 Adjuvant Chemotherapy   Daratumumab  Weekly    02/29/2020 - 03/13/2020 Radiation Therapy   The targets were treated to a total dose of 20 Gy in 10 fractions of 2 Gy each to the left hip and L5 using one plan.   06/02/2020 - 10/05/2020 Adjuvant Chemotherapy   Daratumumab  and Carfilzomib    10/19/2020 -  Adjuvant Chemotherapy   Maintenance Daratumumab --initially every 2 weeks, then every 4 weeks.    05/24/2022 -  Chemotherapy   Patient is on Treatment Plan : MYELOMA Daratumumab  IV q28d     Malignant neoplasm metastatic to bone (HCC)  02/08/2021 Initial Diagnosis   Bone metastases (HCC)   05/24/2022 -  Chemotherapy   Patient is on Treatment Plan : MYELOMA Daratumumab  IV q28d  INTERVAL HISTORY: Tracy [name] is a 78 y.o. female who is here today for continued evaluation and management of multiple myeloma.   she was last seen by me on 02/27/2024; at the time she mentioned experiencing tenderness in her fingers and swelling of her right hand due to a yellow jacket sting. She also noted the intermittent loss of taste and smell after her pneumonia infection.  Today, she   Denies any  [x]  new infection issues,  [x]  fevers/chills,  [x]  drenching night sweats,  [x]  unexpected weight change,  [x]  back pain,  [x]  chest pain,  [x]  abdominal pain,  [x]  new bone pains,  [x]  new lumps/bumps,  [x]  leg swelling,  [x]  bleeding issues (nose bleeds, gum bleeds, abnormal/spontaneous  bruising),  [x]  nausea/vomiting,  [x]  diarrhea,  [x]  bowel/urinary changes, [x]  SOB,  [x]  change in breathing   REVIEW OF SYSTEMS:   10 Point review of systems of done and is negative except as noted above.  MEDICAL HISTORY Past Medical History:  Diagnosis Date   Angiodysplasia of intestine 08/23/2020   Anxiety    Arthritis    Breast cancer (HCC) 06/13/2015   CAD (coronary artery disease)    Cancer (HCC) 2000   breast cancer   Chronic bronchitis (HCC)    Chronic bronchitis (HCC)    Hyperlipidemia    Hypertension    Myocardial infarction [facility]) 2001   Personal history of radiation therapy    Restless leg    Stroke (HCC) 2004   TIA, no deficits    SURGICAL HISTORY Past Surgical History:  Procedure Laterality Date   ABDOMINAL HYSTERECTOMY  1985   BREAST LUMPECTOMY Left 2000   radiation and chemo   BREAST LUMPECTOMY Right 2016   radiation   BREAST SURGERY  2001   lt breast lumpectomy   COLONOSCOPY WITH ESOPHAGOGASTRODUODENOSCOPY (EGD)  04/2020   ENTEROSCOPY N/A 03/15/2021   Procedure: ENTEROSCOPY;  Surgeon: Teressa Toribio SQUIBB, MD;  Location: [facility];  Service: Endoscopy;  Laterality: N/A;   GIVENS CAPSULE STUDY  07/2020   HOT HEMOSTASIS N/A 03/15/2021   Procedure: HOT HEMOSTASIS (ARGON PLASMA COAGULATION/BICAP);  Surgeon: Teressa Toribio SQUIBB, MD;  Location: [facility];  Service: Endoscopy;  Laterality: N/A;   IR IMAGING GUIDED PORT INSERTION  04/25/2020   RADIOACTIVE SEED GUIDED PARTIAL MASTECTOMY WITH AXILLARY SENTINEL LYMPH NODE BIOPSY Right 07/07/2015   Procedure: RIGHT RADIOACTIVE SEED GUIDED PARTIAL MASTECTOMY WITH AXILLARY SENTINEL LYMPH NODE BIOPSY;  Surgeon: Deward Null III, MD;  Location: [facility];  Service: General;  Laterality: Right;   SMALL INTESTINE SURGERY     TUBAL LIGATION     UPPER GASTROINTESTINAL ENDOSCOPY      SOCIAL HISTORY Social History   Tobacco Use   Smoking status: Former    Current packs/day: 0.00    Average  packs/day: 1 pack/day for 20.0 years (20.0 ttl pk-yrs)    Types: Cigarettes    Start date: 07/30/1991    Quit date: 07/30/2011    Years since quitting: 12.9   Smokeless tobacco: Never   Tobacco comments:    Quit >4 years ago; 1 ppd for about 5/20 years (remaining was less)  Vaping Use   Vaping status: Former  Substance Use Topics   Alcohol use: No    Alcohol/week: 0.0 standard drinks of alcohol   Drug use: No    Social History   Social History Narrative   Lives alone.  Retired.  Education:  11th grade GED.  Children:  7 (one  here).     SOCIAL DRIVERS OF HEALTH SDOH Screenings   Food Insecurity: No Food Insecurity (04/06/2024)  Housing: Unknown (04/06/2024)  Transportation Needs: No Transportation Needs (04/06/2024)  Utilities: Not At Risk (04/06/2024)  Alcohol Screen: Low Risk  (04/06/2024)  Depression (PHQ2-9): Low Risk  (05/28/2024)  Recent Concern: Depression (PHQ2-9) - Medium Risk (04/06/2024)  Financial Resource Strain: Low Risk  (04/06/2024)  Physical Activity: Inactive (04/06/2024)  Social Connections: Moderately Isolated (04/06/2024)  Stress: No Stress Concern Present (04/06/2024)  Tobacco Use: Medium Risk (05/20/2024)  Health Literacy: Adequate Health Literacy (04/06/2024)     FAMILY HISTORY Family History  Problem Relation Age of Onset   Emphysema Mother 28       smoker   Diabetes Father    Lung cancer Sister        dx. <50; former smoker   Diabetes Brother    Diabetes Brother    Brain cancer Brother 42       unknown tumor type   Diabetes Paternal Aunt    Stroke Maternal Grandmother    Diabetes Paternal Grandmother    Cancer Daughter 45       neck cancer   Other Daughter        hysterectomy for unspecified reason   Colon cancer Daughter    Breast cancer Cousin    Cancer Cousin        unspecified type   Breast cancer Other        triple negative breast cancer in her 31s   Colon polyps Neg Hx    Esophageal cancer Neg Hx    Gallbladder disease Neg Hx       ALLERGIES: is allergic to bacitracin-neomycin-polymyxin  [neomycin-bacitracin zn-polymyx], latex, nsaids, tape, ambien [zolpidem tartrate], amoxicillin, clavulanic acid, contrast media [iodinated contrast media], and tessalon  [benzonatate ].  MEDICATIONS  Current Outpatient Medications  Medication Sig Dispense Refill   acyclovir  (ZOVIRAX ) 400 MG tablet Take 1 tablet (400 mg total) by mouth 2 (two) times daily. 180 tablet 2   albuterol  (PROVENTIL ) (2.5 MG/3ML) 0.083% nebulizer solution Take 3 mLs (2.5 mg total) by nebulization every 4 (four) hours as needed for wheezing or shortness of breath. 150 mL 1   albuterol  (VENTOLIN  HFA) 108 (90 Base) MCG/ACT inhaler Inhale 2 puffs into the lungs every 6 (six) hours as needed for wheezing or shortness of breath. 8 g 0   atorvastatin  (LIPITOR) 10 MG tablet TAKE 1 TABLET BY MOUTH DAILY 100 tablet 2   dicyclomine  (BENTYL ) 10 MG capsule Take 1 capsule (10 mg total) by mouth every 6 (six) hours. 30 capsule 1   diphenhydrAMINE  (BENADRYL ) 25 MG tablet Take 1 tablet (25 mg total) by mouth every 6 (six) hours as needed. 30 tablet 0   glucose blood (ACCU-CHEK GUIDE) test strip Use as instructed to check sugars 1-2 times daily. 100 each 12   levocetirizine (XYZAL ) 5 MG tablet TAKE 1 TABLET BY MOUTH EVERY  EVENING 80 tablet 0   lidocaine  (LIDODERM ) 5 % Place 1 patch onto the skin daily. Remove & Discard patch within 12 hours or as directed by MD 90 patch 1   lidocaine -prilocaine  (EMLA ) cream APPLY TOPICALLY TO PORT SITE 1  HOUR PRIOR TO ACCESS AS NEEDED 30 g 0   meclizine  (ANTIVERT ) 25 MG tablet Take 1 tablet (25 mg total) by mouth 3 (three) times daily as needed for dizziness. 30 tablet 0   metFORMIN  (GLUCOPHAGE ) 500 MG tablet TAKE 1 TABLET BY MOUTH TWICE  DAILY WITH MEALS 200  tablet 2   ondansetron  (ZOFRAN ) 8 MG tablet Take 1 tablet (8 mg total) by mouth every 8 (eight) hours as needed for nausea or vomiting. 60 tablet 0   oxyCODONE -acetaminophen   (PERCOCET/ROXICET) 5-325 MG tablet Take 1 tablet by mouth every 6 (six) hours as needed for severe pain (pain score 7-10). 8 tablet 0   pantoprazole  (PROTONIX ) 40 MG tablet TAKE 1 TABLET BY MOUTH TWICE  DAILY 200 tablet 2   pregabalin  (LYRICA ) 150 MG capsule Take 1 capsule (150 mg total) by mouth 2 (two) times daily. 180 capsule 1   Tiotropium Bromide  Monohydrate (SPIRIVA  RESPIMAT) 2.5 MCG/ACT AERS Inhale 2 puffs into the lungs 2 (two) times daily. 4 g 0   tiZANidine  (ZANAFLEX ) 4 MG tablet TAKE 1 TABLET BY MOUTH AT  BEDTIME 100 tablet 2   traZODone  (DESYREL ) 100 MG tablet TAKE 1 TABLET BY MOUTH AT  BEDTIME 90 tablet 3   Vitamin D , Ergocalciferol , (DRISDOL ) 1.25 MG (50000 UNIT) CAPS capsule TAKE 1 CAPSULE BY MOUTH ONCE  WEEKLY 15 capsule 2   No current facility-administered medications for this visit.    PHYSICAL EXAMINATION: ECOG PERFORMANCE STATUS: 1 - Symptomatic but completely ambulatory VITALS: VSS GENERAL: alert, in no acute distress and comfortable SKIN: no acute rashes, no significant lesions EYES: conjunctiva are pink and non-injected, sclera anicteric OROPHARYNX: MMM, no exudates, no oropharyngeal erythema or ulceration NECK: supple, no JVD LYMPH:  no palpable lymphadenopathy in the cervical, axillary or inguinal regions LUNGS: clear to auscultation b/l with normal respiratory effort HEART: regular rate & rhythm ABDOMEN:  normoactive bowel sounds , non tender, not distended, no hepatosplenomegaly Extremity: no pedal edema PSYCH: alert & oriented x 3 with fluent speech NEURO: no focal motor/sensory deficits  LABORATORY DATA:   I have reviewed the data as listed     Latest Ref Rng & Units 06/22/2024   10:14 AM 05/28/2024   12:09 PM 05/20/2024    9:42 AM  CBC EXTENDED  WBC 4.0 - 10.5 K/uL 4.9  8.5  5.4   RBC 3.87 - 5.11 MIL/uL 3.17  3.35  3.59   Hemoglobin 12.0 - 15.0 g/dL 89.9  89.4  88.6   HCT 36.0 - 46.0 % 30.1  30.8  34.3   Platelets 150 - 400 K/uL 248  314  262.0    NEUT# 1.7 - 7.7 K/uL 2.1  5.8  2.5   Lymph# 0.7 - 4.0 K/uL 2.4  2.3  2.4        Latest Ref Rng & Units 06/22/2024   10:14 AM 05/28/2024   12:09 PM 05/20/2024    9:42 AM  CMP  Glucose 70 - 99 mg/dL 875  858  875   BUN 8 - 23 mg/dL 10  14  17    Creatinine 0.44 - 1.00 mg/dL 8.80  8.68  8.67   Sodium 135 - 145 mmol/L 143  141  142   Potassium 3.5 - 5.1 mmol/L 4.4  4.5  4.4   Chloride 98 - 111 mmol/L 109  106  103   CO2 22 - 32 mmol/L 22  25  27    Calcium  8.9 - 10.3 mg/dL 8.8  9.8  9.1   Total Protein 6.5 - 8.1 g/dL 6.5  7.0  6.7   Total Bilirubin 0.0 - 1.2 mg/dL <9.7  0.3  0.3   Alkaline Phos 38 - 126 U/L 76  68  68   AST 15 - 41 U/L 20  13  17    ALT  0 - 44 U/L 14  12  12      Mammogram 09/23/2022:     RADIOGRAPHIC STUDIES: I have personally reviewed the radiological images as listed and agreed with the findings in the report. No results found.  ASSESSMENT & PLAN:   78 y.o. female with   1)  IgG Kappa Multiple myeloma with bone lesions, anemia, renal insuff. M spike @ 3.7g/dl on diagnosis. 1p deletion, polymorphic variant, 13q deletion Multiple myeloma panel from 06/28/2021 with no M spike.  IFE positive for IgG kappa monoclonal protein possibly from her daratumumab . Currently on monthly daratumumab  2) h/o DM2 3) Diabetic Neuropathy 4) CKD - likely from DM2, but could have an element of myeloma kidney. 5) h/o TIA and AMI 6) Iron  deficiency 7) B12 deficiency  PLAN: - Discussed lab results on 06/22/2024 in detail with patient: -CBC with stable hgb of 10 and normal WBC counts and platelets. -cmp stable -myeloma panel shows M spike of 0.1g/dl of IgG Kappa (likely from Dara)\ - no clinical or lab findings suggest of myeloma progression at this time. -tolerating Dara faspro without any acute new toxicities and we shall continue this with the same supportive medications -continue Acyclovir  -continue B12 q4 weeks -continue Pamidronate  every 8 weeks  FOLLOW-UP   -Continue Monthly Dara faspro per integrated scheduling -B12 q4weeks -Pamidronate  q8 weeks MD visit in 8 weeks  The total time spent in the appointment was 30 minutes* .  All of the patient's questions were answered and the patient knows to call the clinic with any problems, questions, or concerns.  Emaline Saran MD MS AAHIVMS [facility] Hematology/Oncology Physician [facility]  *Total Encounter Time as defined by the Centers for Medicare and Medicaid Services includes, in addition to the face-to-face time of a patient visit (documented in the note above) non-face-to-face time: obtaining and reviewing outside history, ordering and reviewing medications, tests or procedures, care coordination (communications with other health care professionals or caregivers) and documentation in the medical record.  I, Alan Blowers, acting as a neurosurgeon for Emaline Saran, MD.,have documented all relevant documentation on the behalf of Emaline Saran, MD,as directed by  Emaline Saran, MD while in the presence of Emaline Saran, MD.  I have reviewed the above documentation for accuracy and completeness, and I agree with the above.  Aella Ronda, MD

## 2024-06-22 NOTE — Patient Instructions (Signed)
 [facility] - A DEPT OF [facility]   Discharge Instructions: Thank you for choosing [facility] to provide your oncology and hematology care.   If you have a lab appointment with the Cancer Center, please go directly to the Cancer Center and check in at the registration area.   Wear comfortable clothing and clothing appropriate for easy access to any Portacath or PICC line.   We strive to give you quality time with your provider. You may need to reschedule your appointment if you arrive late (15 or more minutes).  Arriving late affects you and other patients whose appointments are after yours.  Also, if you miss three or more appointments without notifying the office, you may be dismissed from the clinic at the provider's discretion.      For prescription refill requests, have your pharmacy contact our office and allow 72 hours for refills to be completed.    Today you received the following chemotherapy and/or immunotherapy agents: Daratumumab (Darzalex)      To help prevent nausea and vomiting after your treatment, we encourage you to take your nausea medication as directed.  BELOW ARE SYMPTOMS THAT SHOULD BE REPORTED IMMEDIATELY: *FEVER GREATER THAN 100.4 F (38 C) OR HIGHER *CHILLS OR SWEATING *NAUSEA AND VOMITING THAT IS NOT CONTROLLED WITH YOUR NAUSEA MEDICATION *UNUSUAL SHORTNESS OF BREATH *UNUSUAL BRUISING OR BLEEDING *URINARY PROBLEMS (pain or burning when urinating, or frequent urination) *BOWEL PROBLEMS (unusual diarrhea, constipation, pain near the anus) TENDERNESS IN MOUTH AND THROAT WITH OR WITHOUT PRESENCE OF ULCERS (sore throat, sores in mouth, or a toothache) UNUSUAL RASH, SWELLING OR PAIN  UNUSUAL VAGINAL DISCHARGE OR ITCHING   Items with * indicate a potential emergency and should be followed up as soon as possible or go to the Emergency Department if any problems should occur.  Please show the CHEMOTHERAPY ALERT CARD or  IMMUNOTHERAPY ALERT CARD at check-in to the Emergency Department and triage nurse.  Should you have questions after your visit or need to cancel or reschedule your appointment, please contact [facility] - [facility] DE[facility]  Dept: [phone]  and follow the prompts.  Office hours are 8:00 a.m. to 4:30 p.m. Monday - Friday. Please note that voicemails left after 4:00 p.m. may not be returned until the following business day.  We are closed weekends and major holidays. You have access to a nurse at all times for urgent questions. Please call the main number to the clinic Dept: [phone] and follow the prompts.   For any non-urgent questions, you may also contact your provider using MyChart. We now offer e-Visits for anyone 5 and older to request care online for non-urgent symptoms. For details visit [email or web address].   Also download the MyChart app! Go to the app store, search "MyChart", open the app, select [facility], and log in with your MyChart username and password.

## 2024-06-23 ENCOUNTER — Inpatient Hospital Stay

## 2024-06-23 ENCOUNTER — Inpatient Hospital Stay: Admitting: Hematology

## 2024-06-25 ENCOUNTER — Inpatient Hospital Stay

## 2024-06-25 LAB — MULTIPLE MYELOMA PANEL, SERUM
Albumin SerPl Elph-Mcnc: 3.3 g/dL (ref 2.9–4.4)
Albumin/Glob SerPl: 1.3 (ref 0.7–1.7)
Alpha 1: 0.2 g/dL (ref 0.0–0.4)
Alpha2 Glob SerPl Elph-Mcnc: 1.1 g/dL — ABNORMAL HIGH (ref 0.4–1.0)
B-Globulin SerPl Elph-Mcnc: 1 g/dL (ref 0.7–1.3)
Gamma Glob SerPl Elph-Mcnc: 0.4 g/dL (ref 0.4–1.8)
Globulin, Total: 2.7 g/dL (ref 2.2–3.9)
IgA: 61 mg/dL — ABNORMAL LOW (ref 64–422)
IgG (Immunoglobin G), Serum: 433 mg/dL — ABNORMAL LOW (ref 586–1602)
IgM (Immunoglobulin M), Srm: 38 mg/dL (ref 26–217)
M Protein SerPl Elph-Mcnc: 0.1 g/dL — ABNORMAL HIGH
Total Protein ELP: 6 g/dL (ref 6.0–8.5)

## 2024-06-28 NOTE — Progress Notes (Incomplete)
 HEMATOLOGY ONCOLOGY PROGRESS NOTE  Date of service: 06/22/2024  Patient Care Team: Mahlon Comer BRAVO, MD as PCP - General (Family Medicine) Cesario Boer, MD as Attending Physician (Physical Medicine and Rehabilitation) Sheril Coy, MD as Consulting Physician (Orthopedic Surgery) Carlie Clark, MD as Consulting Physician (Otolaryngology) Luis Purchase, MD as Consulting Physician (Gastroenterology) Curvin Deward MOULD, MD as Consulting Physician (General Surgery)  CHIEF COMPLAINT/PURPOSE OF CONSULTATION: Follow-up for continued evaluation and management of multiple myeloma.  HISTORY OF PRESENTING ILLNESS:  (06/16/2020) Tracy [name] is a wonderful 78 y.o. female who has been referred to us  by Dr Odean for evaluation and management of Multiple Myeloma. Pt is accompanied today by her daughter in person and other daughter and granddaughter via phone. The pt reports that she is doing well overall.    When pt was first diagnosed with Breast Cancer it was localized in both of her breasts and was considered Stage 1. She then received chemotherapy. However, she declined chemotherapy a second time and chose radiation therapy after recurrence. Pt has been under the surveillance of Dr. Vinay Gudena after declining antiestrogen therapy.    The pt reports that she was having difficulty breathing so she saw her Pulmonologist, who ordered the work up. Based on the results of the PET/CT pt was sent to Dr. Odean as they were concerned that her breast cancer had spread. Pt has been becoming increasingly anemic over the last year, despite using a PO Iron  supplement. She has had a positive Fecal Occult tests, but has been having issues with hemorrhoids and hemorrhoidal bleeding for the last 5-6 months. Pt denies any blood in her stools but notices blood on the tissue after she wipes. Pt has also been having some discomfort in her left hip and lower back. This pain is worsened when she sits down.    Pt has had  a chronic cough that has been thought to either be Bronchitis or a result of acid reflux. Pt is planning to receive an Endoscopy to work this up further. She is currently taking Gabapentin  for tingling/burning in her extremities. Pt has Type II Diabetes and was previously using Metformin , but discontinued due to concern for liver damage. She is not currently using any medications to control her Diabetes.    Pt had a heart attack in 2001. She initially though that she had a [employer], until the sensation began to travel up her body. She was given Nitroglycerin upon admittance to the hospital. Pt did not require any stents or other interventions. No cause for her heart attack was ever discovered. Pt has been seen by Neurology, Dr. Margaret, who completed imaging on her neck and found a degeneratve disk. Pt has not had a nerve study conducted. Pt has no history of Shingles and has not received the Shingles vaccine. She has been fully vaccinated against he COVID19 virus.      Of note prior to the patient's visit today, pt has had PET/CT ([id number]) completed on 01/20/2020 with results revealing 1. Hypermetabolism corresponding to left acetabular and L5 lytic lesions, with differential considerations of metastatic disease or myeloma. 2. No evidence of hypermetabolic soft tissue primary, metastatic disease, or soft tissue myeloma. 3. Incidental findings, including: Aortic atherosclerosis (ICD10-I70.0) and emphysema (ICD10-J43.9). Hepatic steatosis.    Pt has had Bone Marrow Surgical Pathology ([id number]) completed on 01/17/2020 which revealed BONE MARROW, ASPIRATE, CLOT, CORE: -Hypercellular bone marrow for age with plasma cell neoplasm PERIPHERAL BLOOD: -Normocytic-normochromic anemia   Pt has had Left Ischium  Biopsy Surgical Pathology Report [id number]) completed on 01/17/2020 which revealed Plasma cell neoplasm.   Most recent lab results (01/25/2020) of CBC is as follows: all values are  WNL except for RBC at 2.44, Hgb at 7.5, HCT at 24.0, CO2 at 21, Glucose at 129, Creatinine at 1.38, Total Protein at 10.1, Albumin at 2.8, Total Bilirubin at <0.2, GFR Est Afr Am at 43. 01/25/2020 K/L light chains is as follows: Kappa free light chain at 22.7, Lamda free light chains at 17.7, K/L light chain ratio at 1.28 01/25/2020 MMP is as follows: all values are WNL except for IgG at 5220, Total Protein at 10.1, Alpha2 Glob at 1.2, Gamma Glob at 3.9, M Protein at 3.7, Total Globulin at 6.7, Albumin/Glob at 0.6. 01/25/2020 24-hr UPEP shows all values are WNL   On review of systems, pt reports lower back/left hip pain, unexpected weight loss, abdominal pain, loose stools and denies low appetite, constipation, rashes and any other symptoms.    On PMHx the pt reports Breast Cancer, Breast Lumpectomy, TIA, Restless leg, Myocardial infarction, HTN, HLD, Chronic Bronchitis.     SUMMARY OF ONCOLOGIC HISTORY: Oncology History  History of right breast cancer  04/16/2000 Surgery   Left breast: Triple negative  invasive ductal cancer treated with lumpectomy, adjuvant chemotherapy, radiation , in [place] , unknown stage   06/07/2015 Mammogram   Right breast mass 6x 6 x 5 mm, right axillary lymph node with slight cortex thickening measured 5 mm    06/13/2015 Initial Diagnosis   Right breast needle biopsy: Invasive ductal carcinoma, grade 1, right axillary lymph node biopsy negative , ER 95%, PR 5%, Ki-67 10%, HER-2 negative   06/13/2015 Clinical Stage   Stage IA: T1b N0   07/07/2015 Surgery   Right lumpectomy: Invasive ductal carcinoma grade 1, 1 cm span, with low-grade DCIS, DCIS focally 0.1 cm to inferior margin, 0/3 lymph nodes negative, ER 95%, PR 5%, HER-2 negative ratio 1.1, Ki-67 10%   07/07/2015 Pathologic Stage   Stage IA: T1c N0   07/13/2015 Procedure   Breast High/Moderate Risk Panel reveals no clinically significant variant at ATM, BRCA1, BRCA2, CDH1, CHEK2, PALB2, PTEN, and TP53.      08/23/2015 - 09/21/2015 Radiation Therapy   Adjuvant Radiation: Right breast/ 42.5Gy at 2.5 Gy per fraction x 17 fractions.   Right breast boost/ 7.5 Gy at 2.5 Gy per fraction x 3 fractions    Anti-estrogen oral therapy   Patient refused antiestrogen therapy   10/20/2015 Survivorship   Survivorship care plan completed and mailed to patient in lieu of in person visit at her request   Iron  deficiency anemia  Multiple myeloma (HCC)  01/25/2020 Initial Diagnosis   Multiple myeloma (HCC)   02/11/2020 - 05/19/2020 Adjuvant Chemotherapy   Daratumumab  Weekly    02/29/2020 - 03/13/2020 Radiation Therapy   The targets were treated to a total dose of 20 Gy in 10 fractions of 2 Gy each to the left hip and L5 using one plan.   06/02/2020 - 10/05/2020 Adjuvant Chemotherapy   Daratumumab  and Carfilzomib    10/19/2020 -  Adjuvant Chemotherapy   Maintenance Daratumumab --initially every 2 weeks, then every 4 weeks.    05/24/2022 -  Chemotherapy   Patient is on Treatment Plan : MYELOMA Daratumumab  IV q28d     Malignant neoplasm metastatic to bone (HCC)  02/08/2021 Initial Diagnosis   Bone metastases (HCC)   05/24/2022 -  Chemotherapy   Patient is on Treatment Plan : MYELOMA Daratumumab  IV q28d  INTERVAL HISTORY: Tracy [name] is a 78 y.o. female who is here today for continued evaluation and management of multiple myeloma.   she was last seen by me on 02/27/2024; at the time she mentioned experiencing tenderness in her fingers and swelling of her right hand due to a yellow jacket sting. She also noted the intermittent loss of taste and smell after her pneumonia infection.  Today, she   Denies any  [x]  new infection issues,  [x]  fevers/chills,  [x]  drenching night sweats,  [x]  unexpected weight change,  [x]  back pain,  [x]  chest pain,  [x]  abdominal pain,  [x]  new bone pains,  [x]  new lumps/bumps,  [x]  leg swelling,  [x]  bleeding issues (nose bleeds, gum bleeds, abnormal/spontaneous  bruising),  [x]  nausea/vomiting,  [x]  diarrhea,  [x]  bowel/urinary changes, [x]  SOB,  [x]  change in breathing   REVIEW OF SYSTEMS:   10 Point review of systems of done and is negative except as noted above.  MEDICAL HISTORY Past Medical History:  Diagnosis Date  . Angiodysplasia of intestine 08/23/2020  . Anxiety   . Arthritis   . Breast cancer (HCC) 06/13/2015  . CAD (coronary artery disease)   . Cancer (HCC) 2000   breast cancer  . Chronic bronchitis (HCC)   . Chronic bronchitis (HCC)   . Hyperlipidemia   . Hypertension   . Myocardial infarction (HCC) 2001  . Personal history of radiation therapy   . Restless leg   . Stroke [facility]) 2004   TIA, no deficits    SURGICAL HISTORY Past Surgical History:  Procedure Laterality Date  . ABDOMINAL HYSTERECTOMY  1985  . BREAST LUMPECTOMY Left 2000   radiation and chemo  . BREAST LUMPECTOMY Right 2016   radiation  . BREAST SURGERY  2001   lt breast lumpectomy  . COLONOSCOPY WITH ESOPHAGOGASTRODUODENOSCOPY (EGD)  04/2020  . ENTEROSCOPY N/A 03/15/2021   Procedure: ENTEROSCOPY;  Surgeon: Teressa Toribio SQUIBB, MD;  Location: [facility];  Service: Endoscopy;  Laterality: N/A;  . GIVENS CAPSULE STUDY  07/2020  . HOT HEMOSTASIS N/A 03/15/2021   Procedure: HOT HEMOSTASIS (ARGON PLASMA COAGULATION/BICAP);  Surgeon: Teressa Toribio SQUIBB, MD;  Location: [facility];  Service: Endoscopy;  Laterality: N/A;  . IR IMAGING GUIDED PORT INSERTION  04/25/2020  . RADIOACTIVE SEED GUIDED PARTIAL MASTECTOMY WITH AXILLARY SENTINEL LYMPH NODE BIOPSY Right 07/07/2015   Procedure: RIGHT RADIOACTIVE SEED GUIDED PARTIAL MASTECTOMY WITH AXILLARY SENTINEL LYMPH NODE BIOPSY;  Surgeon: Deward Null III, MD;  Location: [facility];  Service: General;  Laterality: Right;  . SMALL INTESTINE SURGERY    . TUBAL LIGATION    . UPPER GASTROINTESTINAL ENDOSCOPY      SOCIAL HISTORY Social History   Tobacco Use  . Smoking status: Former    Current  packs/day: 0.00    Average packs/day: 1 pack/day for 20.0 years (20.0 ttl pk-yrs)    Types: Cigarettes    Start date: 07/30/1991    Quit date: 07/30/2011    Years since quitting: 12.9  . Smokeless tobacco: Never  . Tobacco comments:    Quit >4 years ago; 1 ppd for about 5/20 years (remaining was less)  Vaping Use  . Vaping status: Former  Substance Use Topics  . Alcohol use: No    Alcohol/week: 0.0 standard drinks of alcohol  . Drug use: No    Social History   Social History Narrative   Lives alone.  Retired.  Education:  11th grade GED.  Children:  7 (one  here).     SOCIAL DRIVERS OF HEALTH SDOH Screenings   Food Insecurity: No Food Insecurity (04/06/2024)  Housing: Unknown (04/06/2024)  Transportation Needs: No Transportation Needs (04/06/2024)  Utilities: Not At Risk (04/06/2024)  Alcohol Screen: Low Risk  (04/06/2024)  Depression (PHQ2-9): Low Risk  (05/28/2024)  Recent Concern: Depression (PHQ2-9) - Medium Risk (04/06/2024)  Financial Resource Strain: Low Risk  (04/06/2024)  Physical Activity: Inactive (04/06/2024)  Social Connections: Moderately Isolated (04/06/2024)  Stress: No Stress Concern Present (04/06/2024)  Tobacco Use: Medium Risk (05/20/2024)  Health Literacy: Adequate Health Literacy (04/06/2024)     FAMILY HISTORY Family History  Problem Relation Age of Onset  . Emphysema Mother 62       smoker  . Diabetes Father   . Lung cancer Sister        dx. <50; former smoker  . Diabetes Brother   . Diabetes Brother   . Brain cancer Brother 21       unknown tumor type  . Diabetes Paternal Aunt   . Stroke Maternal Grandmother   . Diabetes Paternal Grandmother   . Cancer Daughter 45       neck cancer  . Other Daughter        hysterectomy for unspecified reason  . Colon cancer Daughter   . Breast cancer Cousin   . Cancer Cousin        unspecified type  . Breast cancer Other        triple negative breast cancer in her 11s  . Colon polyps Neg Hx   . Esophageal cancer Neg  Hx   . Gallbladder disease Neg Hx      ALLERGIES: is allergic to bacitracin-neomycin-polymyxin  [neomycin-bacitracin zn-polymyx], latex, nsaids, tape, ambien [zolpidem tartrate], amoxicillin, clavulanic acid, contrast media [iodinated contrast media], and tessalon  [benzonatate ].  MEDICATIONS  Current Outpatient Medications  Medication Sig Dispense Refill  . acyclovir  (ZOVIRAX ) 400 MG tablet Take 1 tablet (400 mg total) by mouth 2 (two) times daily. 180 tablet 2  . albuterol  (PROVENTIL ) (2.5 MG/3ML) 0.083% nebulizer solution Take 3 mLs (2.5 mg total) by nebulization every 4 (four) hours as needed for wheezing or shortness of breath. 150 mL 1  . albuterol  (VENTOLIN  HFA) 108 (90 Base) MCG/ACT inhaler Inhale 2 puffs into the lungs every 6 (six) hours as needed for wheezing or shortness of breath. 8 g 0  . atorvastatin  (LIPITOR) 10 MG tablet TAKE 1 TABLET BY MOUTH DAILY 100 tablet 2  . dicyclomine  (BENTYL ) 10 MG capsule Take 1 capsule (10 mg total) by mouth every 6 (six) hours. 30 capsule 1  . diphenhydrAMINE  (BENADRYL ) 25 MG tablet Take 1 tablet (25 mg total) by mouth every 6 (six) hours as needed. 30 tablet 0  . glucose blood (ACCU-CHEK GUIDE) test strip Use as instructed to check sugars 1-2 times daily. 100 each 12  . levocetirizine (XYZAL ) 5 MG tablet TAKE 1 TABLET BY MOUTH EVERY  EVENING 80 tablet 0  . lidocaine  (LIDODERM ) 5 % Place 1 patch onto the skin daily. Remove & Discard patch within 12 hours or as directed by MD 90 patch 1  . lidocaine -prilocaine  (EMLA ) cream APPLY TOPICALLY TO PORT SITE 1  HOUR PRIOR TO ACCESS AS NEEDED 30 g 0  . meclizine  (ANTIVERT ) 25 MG tablet Take 1 tablet (25 mg total) by mouth 3 (three) times daily as needed for dizziness. 30 tablet 0  . metFORMIN  (GLUCOPHAGE ) 500 MG tablet TAKE 1 TABLET BY MOUTH TWICE  DAILY WITH MEALS 200  tablet 2  . ondansetron  (ZOFRAN ) 8 MG tablet Take 1 tablet (8 mg total) by mouth every 8 (eight) hours as needed for nausea or vomiting. 60  tablet 0  . oxyCODONE -acetaminophen  (PERCOCET/ROXICET) 5-325 MG tablet Take 1 tablet by mouth every 6 (six) hours as needed for severe pain (pain score 7-10). 8 tablet 0  . pantoprazole  (PROTONIX ) 40 MG tablet TAKE 1 TABLET BY MOUTH TWICE  DAILY 200 tablet 2  . pregabalin  (LYRICA ) 150 MG capsule Take 1 capsule (150 mg total) by mouth 2 (two) times daily. 180 capsule 1  . Tiotropium Bromide  Monohydrate (SPIRIVA  RESPIMAT) 2.5 MCG/ACT AERS Inhale 2 puffs into the lungs 2 (two) times daily. 4 g 0  . tiZANidine  (ZANAFLEX ) 4 MG tablet TAKE 1 TABLET BY MOUTH AT  BEDTIME 100 tablet 2  . traZODone  (DESYREL ) 100 MG tablet TAKE 1 TABLET BY MOUTH AT  BEDTIME 90 tablet 3  . Vitamin D , Ergocalciferol , (DRISDOL ) 1.25 MG (50000 UNIT) CAPS capsule TAKE 1 CAPSULE BY MOUTH ONCE  WEEKLY 15 capsule 2   No current facility-administered medications for this visit.    PHYSICAL EXAMINATION: ECOG PERFORMANCE STATUS: 1 - Symptomatic but completely ambulatory VITALS: VSS GENERAL: alert, in no acute distress and comfortable SKIN: no acute rashes, no significant lesions EYES: conjunctiva are pink and non-injected, sclera anicteric OROPHARYNX: MMM, no exudates, no oropharyngeal erythema or ulceration NECK: supple, no JVD LYMPH:  no palpable lymphadenopathy in the cervical, axillary or inguinal regions LUNGS: clear to auscultation b/l with normal respiratory effort HEART: regular rate & rhythm ABDOMEN:  normoactive bowel sounds , non tender, not distended, no hepatosplenomegaly Extremity: no pedal edema PSYCH: alert & oriented x 3 with fluent speech NEURO: no focal motor/sensory deficits  LABORATORY DATA:   I have reviewed the data as listed     Latest Ref Rng & Units 06/22/2024   10:14 AM 05/28/2024   12:09 PM 05/20/2024    9:42 AM  CBC EXTENDED  WBC 4.0 - 10.5 K/uL 4.9  8.5  5.4   RBC 3.87 - 5.11 MIL/uL 3.17  3.35  3.59   Hemoglobin 12.0 - 15.0 g/dL 89.9  89.4  88.6   HCT 36.0 - 46.0 % 30.1  30.8  34.3    Platelets 150 - 400 K/uL 248  314  262.0   NEUT# 1.7 - 7.7 K/uL 2.1  5.8  2.5   Lymph# 0.7 - 4.0 K/uL 2.4  2.3  2.4        Latest Ref Rng & Units 06/22/2024   10:14 AM 05/28/2024   12:09 PM 05/20/2024    9:42 AM  CMP  Glucose 70 - 99 mg/dL 875  858  875   BUN 8 - 23 mg/dL 10  14  17    Creatinine 0.44 - 1.00 mg/dL 8.80  8.68  8.67   Sodium 135 - 145 mmol/L 143  141  142   Potassium 3.5 - 5.1 mmol/L 4.4  4.5  4.4   Chloride 98 - 111 mmol/L 109  106  103   CO2 22 - 32 mmol/L 22  25  27    Calcium  8.9 - 10.3 mg/dL 8.8  9.8  9.1   Total Protein 6.5 - 8.1 g/dL 6.5  7.0  6.7   Total Bilirubin 0.0 - 1.2 mg/dL <9.7  0.3  0.3   Alkaline Phos 38 - 126 U/L 76  68  68   AST 15 - 41 U/L 20  13  17    ALT  0 - 44 U/L 14  12  12      Mammogram 09/23/2022:     RADIOGRAPHIC STUDIES: I have personally reviewed the radiological images as listed and agreed with the findings in the report. No results found.  ASSESSMENT & PLAN:   78 y.o. female with   1)  IgG Kappa Multiple myeloma with bone lesions, anemia, renal insuff. M spike @ 3.7g/dl on diagnosis. 1p deletion, polymorphic variant, 13q deletion Multiple myeloma panel from 06/28/2021 with no M spike.  IFE positive for IgG kappa monoclonal protein possibly from her daratumumab . Currently on monthly daratumumab  2) h/o DM2 3) Diabetic Neuropathy 4) CKD - likely from DM2, but could have an element of myeloma kidney. 5) h/o TIA and AMI 6) Iron  deficiency 7) B12 deficiency  PLAN: - Discussed lab results on 06/22/2024 in detail with patient: -CBC with stable hgb of 10 and normal WBC counts and platelets. -cmp stable -myeloma panel shows M spike of 0.1g/dl of IgG Kappa (likely from Dara)\  FOLLOW-UP in {WEEKS/MONTHS:30939} for labs and follow-up with Dr. Onesimo.  The total time spent in the appointment was *** minutes* .  All of the patient's questions were answered and the patient knows to call the clinic with any problems, questions, or  concerns.  Emaline Onesimo MD MS AAHIVMS [facility] Hematology/Oncology Physician [facility]  *Total Encounter Time as defined by the Centers for Medicare and Medicaid Services includes, in addition to the face-to-face time of a patient visit (documented in the note above) non-face-to-face time: obtaining and reviewing outside history, ordering and reviewing medications, tests or procedures, care coordination (communications with other health care professionals or caregivers) and documentation in the medical record.  I, Alan Blowers, acting as a neurosurgeon for Emaline Onesimo, MD.,have documented all relevant documentation on the behalf of Emaline Onesimo, MD,as directed by  Emaline Onesimo, MD while in the presence of Emaline Onesimo, MD.  I have reviewed the above documentation for accuracy and completeness, and I agree with the above.  Aerie Donica, MD

## 2024-06-29 ENCOUNTER — Encounter: Payer: Self-pay | Admitting: Hematology

## 2024-06-30 ENCOUNTER — Ambulatory Visit: Admitting: Family Medicine

## 2024-06-30 ENCOUNTER — Other Ambulatory Visit: Payer: Self-pay

## 2024-06-30 ENCOUNTER — Encounter: Payer: Self-pay | Admitting: Family Medicine

## 2024-06-30 VITALS — BP 140/62 | HR 80 | Temp 98.3°F | Ht 60.0 in | Wt 197.0 lb

## 2024-06-30 DIAGNOSIS — M353 Polymyalgia rheumatica: Secondary | ICD-10-CM

## 2024-06-30 DIAGNOSIS — C9000 Multiple myeloma not having achieved remission: Secondary | ICD-10-CM

## 2024-06-30 DIAGNOSIS — E1140 Type 2 diabetes mellitus with diabetic neuropathy, unspecified: Secondary | ICD-10-CM

## 2024-06-30 MED ORDER — PREDNISONE 10 MG PO TABS: ORAL_TABLET | ORAL | 0 refills | Status: DC

## 2024-06-30 NOTE — Patient Instructions (Addendum)
 Follow up in Feb to recheck sugar We'll notify you of your lab results and make any changes if needed START the Prednisone  as directed- 3 pills at the same time x3 days, then 2 pills at the same time x3 days, then 1 pill daily.  Take w/ food  Please call or message and let me know how you're doing on the Prednisone  after 7 days Call with any questions or concerns Stay Safe! Stay Healthy! Merry Christmas!!!

## 2024-06-30 NOTE — Progress Notes (Unsigned)
   Subjective:    Patient ID: Tracy [name], female    DOB: Jan 12, 1946, 78 y.o.   MRN: [id number]  HPI Neuropathy- ongoing issue.  At last visit we switched to Lyrica  (from Gabapentin ).  Pt reports sxs in hands have improved somewhat but is having bilateral leg pain.  Leg pain is described as a 'muscle pain'.  Pain will start in thighs and radiate down front of lower legs.  Pt reports legs feel weak- particularly upon standing- and feels that they will not support her.  Pt reports muscle pain has been worsening 'for a couple of months'.  Pt reports that shoulders also feel weak.  Obesity- pt has gained 6 lbs, BMI 38.47   Review of Systems For ROS see HPI     Objective:   Physical Exam Vitals reviewed.  Constitutional:      General: She is not in acute distress.    Appearance: Normal appearance. She is obese. She is not ill-appearing.  HENT:     Head: Normocephalic and atraumatic.  Eyes:     Extraocular Movements: Extraocular movements intact.     Conjunctiva/sclera: Conjunctivae normal.  Cardiovascular:     Rate and Rhythm: Normal rate and regular rhythm.  Pulmonary:     Effort: Pulmonary effort is normal. No respiratory distress.  Musculoskeletal:     Comments: Difficulty rising from a seated position  Neurological:     Mental Status: She is alert and oriented to person, place, and time.  Psychiatric:        Mood and Affect: Mood normal.        Behavior: Behavior normal.        Thought Content: Thought content normal.           Assessment & Plan:  Polymyalgia- new.  Pt is reporting bilateral anterior leg pain and weakness.  Described as a muscle ache.  Reports difficulty rising from a seated position.  Upon further questioning, also reports shoulder pain and weakness.  Sxs are concerning for possible PMR.  Will get ESR and start low dose Prednisone  taper to monitor for improvement.  Pt expressed understanding and is in agreement w/ plan.

## 2024-07-01 NOTE — Assessment & Plan Note (Signed)
 Ongoing issue.  Pt reports sxs in hands have improved since switching from Gabapentin  to Lyrica  but remain.  No changes at this time.

## 2024-07-01 NOTE — Assessment & Plan Note (Signed)
 Deteriorated.  Pt has gained 6 lbs.  BMI of 38.47 qualifies as morbid obesity w/ her other dx of DM, HTN, Hyperlipidemia.  Encouraged low carb diet.  Will follow.

## 2024-07-02 ENCOUNTER — Inpatient Hospital Stay: Attending: Hematology

## 2024-07-02 ENCOUNTER — Inpatient Hospital Stay

## 2024-07-02 VITALS — BP 149/78 | HR 77 | Temp 98.2°F | Resp 18

## 2024-07-02 DIAGNOSIS — E1140 Type 2 diabetes mellitus with diabetic neuropathy, unspecified: Secondary | ICD-10-CM | POA: Diagnosis not present

## 2024-07-02 DIAGNOSIS — I252 Old myocardial infarction: Secondary | ICD-10-CM | POA: Diagnosis not present

## 2024-07-02 DIAGNOSIS — I129 Hypertensive chronic kidney disease with stage 1 through stage 4 chronic kidney disease, or unspecified chronic kidney disease: Secondary | ICD-10-CM | POA: Diagnosis not present

## 2024-07-02 DIAGNOSIS — E1122 Type 2 diabetes mellitus with diabetic chronic kidney disease: Secondary | ICD-10-CM | POA: Diagnosis not present

## 2024-07-02 DIAGNOSIS — N189 Chronic kidney disease, unspecified: Secondary | ICD-10-CM | POA: Diagnosis not present

## 2024-07-02 DIAGNOSIS — C9000 Multiple myeloma not having achieved remission: Secondary | ICD-10-CM | POA: Insufficient documentation

## 2024-07-02 DIAGNOSIS — I2510 Atherosclerotic heart disease of native coronary artery without angina pectoris: Secondary | ICD-10-CM | POA: Diagnosis not present

## 2024-07-02 DIAGNOSIS — E538 Deficiency of other specified B group vitamins: Secondary | ICD-10-CM | POA: Diagnosis not present

## 2024-07-02 DIAGNOSIS — C7951 Secondary malignant neoplasm of bone: Secondary | ICD-10-CM

## 2024-07-02 LAB — CBC WITH DIFFERENTIAL (CANCER CENTER ONLY)
Abs Immature Granulocytes: 0.04 K/uL (ref 0.00–0.07)
Basophils Absolute: 0 K/uL (ref 0.0–0.1)
Basophils Relative: 0 %
Eosinophils Absolute: 0 K/uL (ref 0.0–0.5)
Eosinophils Relative: 0 %
HCT: 28.9 % — ABNORMAL LOW (ref 36.0–46.0)
Hemoglobin: 9.8 g/dL — ABNORMAL LOW (ref 12.0–15.0)
Immature Granulocytes: 0 %
Lymphocytes Relative: 33 %
Lymphs Abs: 3 K/uL (ref 0.7–4.0)
MCH: 31.7 pg (ref 26.0–34.0)
MCHC: 33.9 g/dL (ref 30.0–36.0)
MCV: 93.5 fL (ref 80.0–100.0)
Monocytes Absolute: 0.6 K/uL (ref 0.1–1.0)
Monocytes Relative: 6 %
Neutro Abs: 5.4 K/uL (ref 1.7–7.7)
Neutrophils Relative %: 61 %
Platelet Count: 252 K/uL (ref 150–400)
RBC: 3.09 MIL/uL — ABNORMAL LOW (ref 3.87–5.11)
RDW: 13.3 % (ref 11.5–15.5)
WBC Count: 8.9 K/uL (ref 4.0–10.5)
nRBC: 0 % (ref 0.0–0.2)

## 2024-07-02 LAB — CMP (CANCER CENTER ONLY)
ALT: 13 U/L (ref 0–44)
AST: 18 U/L (ref 15–41)
Albumin: 4.4 g/dL (ref 3.5–5.0)
Alkaline Phosphatase: 66 U/L (ref 38–126)
Anion gap: 14 (ref 5–15)
BUN: 17 mg/dL (ref 8–23)
CO2: 23 mmol/L (ref 22–32)
Calcium: 9.3 mg/dL (ref 8.9–10.3)
Chloride: 106 mmol/L (ref 98–111)
Creatinine: 1.2 mg/dL — ABNORMAL HIGH (ref 0.44–1.00)
GFR, Estimated: 46 mL/min — ABNORMAL LOW (ref >60)
Glucose, Bld: 112 mg/dL — ABNORMAL HIGH (ref 70–99)
Potassium: 4.1 mmol/L (ref 3.5–5.1)
Sodium: 142 mmol/L (ref 135–145)
Total Bilirubin: 0.2 mg/dL (ref 0.0–1.2)
Total Protein: 6.7 g/dL (ref 6.5–8.1)

## 2024-07-02 LAB — ANA: Anti Nuclear Antibody (ANA): POSITIVE — AB

## 2024-07-02 LAB — ANTI-NUCLEAR AB-TITER (ANA TITER): ANA Titer 1: 1:1280 {titer} — ABNORMAL HIGH

## 2024-07-02 MED ORDER — SODIUM CHLORIDE 0.9 % IV SOLN
60.0000 mg | Freq: Once | INTRAVENOUS | Status: AC
  Administered 2024-07-02: 60 mg via INTRAVENOUS
  Filled 2024-07-02: qty 10

## 2024-07-02 MED ORDER — SODIUM CHLORIDE 0.9 % IV SOLN: INTRAVENOUS | Status: DC

## 2024-07-02 NOTE — Patient Instructions (Signed)
 [facility] - A DEPT OF [facility]  Discharge Instructions: Thank you for choosing [facility] to provide your oncology and hematology care.   If you have a lab appointment with the Cancer Center, please go directly to the Cancer Center and check in at the registration area.   Wear comfortable clothing and clothing appropriate for easy access to any Portacath or PICC line.   We strive to give you quality time with your provider. You may need to reschedule your appointment if you arrive late (15 or more minutes).  Arriving late affects you and other patients whose appointments are after yours.  Also, if you miss three or more appointments without notifying the office, you may be dismissed from the clinic at the provider's discretion.      For prescription refill requests, have your pharmacy contact our office and allow 72 hours for refills to be completed.    Today you received the following chemotherapy and/or immunotherapy agents: Aredia       To help prevent nausea and vomiting after your treatment, we encourage you to take your nausea medication as directed.  BELOW ARE SYMPTOMS THAT SHOULD BE REPORTED IMMEDIATELY: *FEVER GREATER THAN 100.4 F (38 C) OR HIGHER *CHILLS OR SWEATING *NAUSEA AND VOMITING THAT IS NOT CONTROLLED WITH YOUR NAUSEA MEDICATION *UNUSUAL SHORTNESS OF BREATH *UNUSUAL BRUISING OR BLEEDING *URINARY PROBLEMS (pain or burning when urinating, or frequent urination) *BOWEL PROBLEMS (unusual diarrhea, constipation, pain near the anus) TENDERNESS IN MOUTH AND THROAT WITH OR WITHOUT PRESENCE OF ULCERS (sore throat, sores in mouth, or a toothache) UNUSUAL RASH, SWELLING OR PAIN  UNUSUAL VAGINAL DISCHARGE OR ITCHING   Items with * indicate a potential emergency and should be followed up as soon as possible or go to the Emergency Department if any problems should occur.  Please show the CHEMOTHERAPY ALERT CARD or IMMUNOTHERAPY  ALERT CARD at check-in to the Emergency Department and triage nurse.  Should you have questions after your visit or need to cancel or reschedule your appointment, please contact [facility] - A DEPT OF [facility]  Dept: [phone]  and follow the prompts.  Office hours are 8:00 a.m. to 4:30 p.m. Monday - Friday. Please note that voicemails left after 4:00 p.m. may not be returned until the following business day.  We are closed weekends and major holidays. You have access to a nurse at all times for urgent questions. Please call the main number to the clinic Dept: [phone] and follow the prompts.   For any non-urgent questions, you may also contact your provider using MyChart. We now offer e-Visits for anyone 53 and older to request care online for non-urgent symptoms. For details visit [email or web address].   Also download the MyChart app! Go to the app store, search MyChart, open the app, select [facility], and log in with your MyChart username and password.

## 2024-07-05 ENCOUNTER — Other Ambulatory Visit: Payer: Self-pay

## 2024-07-05 DIAGNOSIS — C7951 Secondary malignant neoplasm of bone: Secondary | ICD-10-CM

## 2024-07-05 MED ORDER — ACYCLOVIR 400 MG PO TABS: 400.0000 mg | ORAL_TABLET | Freq: Two times a day (BID) | ORAL | 2 refills | Status: AC

## 2024-07-06 ENCOUNTER — Ambulatory Visit: Payer: Self-pay | Admitting: Family Medicine

## 2024-07-06 DIAGNOSIS — M353 Polymyalgia rheumatica: Secondary | ICD-10-CM

## 2024-07-06 DIAGNOSIS — R7689 Other specified abnormal immunological findings in serum: Secondary | ICD-10-CM

## 2024-07-06 NOTE — Progress Notes (Signed)
 Patient verbalized understanding

## 2024-07-16 ENCOUNTER — Telehealth: Payer: Self-pay

## 2024-07-16 NOTE — Telephone Encounter (Signed)
 Copied from CRM [id number]. Topic: Referral - Question >> Jul 16, 2024 11:13 AM Deaijah H wrote: Reason for CRM: *no question* Patient called in stating [facility] is stating they didn't receive referral

## 2024-07-16 NOTE — Telephone Encounter (Signed)
 Patient states referral has not been received but in referral note appears it has been reviewed.

## 2024-07-19 MED FILL — Dexamethasone Sodium Phosphate Inj 100 MG/10ML: INTRAMUSCULAR | Qty: 1.2 | Status: AC

## 2024-07-20 ENCOUNTER — Inpatient Hospital Stay

## 2024-07-20 VITALS — BP 145/69 | HR 82 | Temp 98.1°F | Resp 16 | Wt 194.0 lb

## 2024-07-20 DIAGNOSIS — C9000 Multiple myeloma not having achieved remission: Secondary | ICD-10-CM | POA: Diagnosis not present

## 2024-07-20 DIAGNOSIS — Z7189 Other specified counseling: Secondary | ICD-10-CM

## 2024-07-20 DIAGNOSIS — C7951 Secondary malignant neoplasm of bone: Secondary | ICD-10-CM

## 2024-07-20 LAB — CMP (CANCER CENTER ONLY)
ALT: 16 U/L (ref 0–44)
AST: 22 U/L (ref 15–41)
Albumin: 4.1 g/dL (ref 3.5–5.0)
Alkaline Phosphatase: 81 U/L (ref 38–126)
Anion gap: 13 (ref 5–15)
BUN: 12 mg/dL (ref 8–23)
CO2: 23 mmol/L (ref 22–32)
Calcium: 9.1 mg/dL (ref 8.9–10.3)
Chloride: 107 mmol/L (ref 98–111)
Creatinine: 1.33 mg/dL — ABNORMAL HIGH (ref 0.44–1.00)
GFR, Estimated: 40 mL/min — ABNORMAL LOW
Glucose, Bld: 126 mg/dL — ABNORMAL HIGH (ref 70–99)
Potassium: 4.4 mmol/L (ref 3.5–5.1)
Sodium: 143 mmol/L (ref 135–145)
Total Bilirubin: 0.2 mg/dL (ref 0.0–1.2)
Total Protein: 6.5 g/dL (ref 6.5–8.1)

## 2024-07-20 LAB — CBC WITH DIFFERENTIAL (CANCER CENTER ONLY)
Abs Immature Granulocytes: 0 K/uL (ref 0.00–0.07)
Basophils Absolute: 0 K/uL (ref 0.0–0.1)
Basophils Relative: 1 %
Eosinophils Absolute: 0 K/uL (ref 0.0–0.5)
Eosinophils Relative: 0 %
HCT: 27.8 % — ABNORMAL LOW (ref 36.0–46.0)
Hemoglobin: 9.4 g/dL — ABNORMAL LOW (ref 12.0–15.0)
Immature Granulocytes: 0 %
Lymphocytes Relative: 41 %
Lymphs Abs: 2 K/uL (ref 0.7–4.0)
MCH: 31.5 pg (ref 26.0–34.0)
MCHC: 33.8 g/dL (ref 30.0–36.0)
MCV: 93.3 fL (ref 80.0–100.0)
Monocytes Absolute: 0.3 K/uL (ref 0.1–1.0)
Monocytes Relative: 6 %
Neutro Abs: 2.6 K/uL (ref 1.7–7.7)
Neutrophils Relative %: 52 %
Platelet Count: 262 K/uL (ref 150–400)
RBC: 2.98 MIL/uL — ABNORMAL LOW (ref 3.87–5.11)
RDW: 12.7 % (ref 11.5–15.5)
WBC Count: 4.9 K/uL (ref 4.0–10.5)
nRBC: 0 % (ref 0.0–0.2)

## 2024-07-20 MED ORDER — FAMOTIDINE IN NACL 20-0.9 MG/50ML-% IV SOLN
20.0000 mg | Freq: Once | INTRAVENOUS | Status: AC
  Administered 2024-07-20: 20 mg via INTRAVENOUS
  Filled 2024-07-20: qty 50

## 2024-07-20 MED ORDER — SODIUM CHLORIDE 0.9 % IV SOLN
12.0000 mg | Freq: Once | INTRAVENOUS | Status: AC
  Administered 2024-07-20: 12 mg via INTRAVENOUS
  Filled 2024-07-20: qty 1.2

## 2024-07-20 MED ORDER — CYANOCOBALAMIN 1000 MCG/ML IJ SOLN
1000.0000 ug | Freq: Once | INTRAMUSCULAR | Status: AC
  Administered 2024-07-20: 1000 ug via INTRAMUSCULAR
  Filled 2024-07-20: qty 1

## 2024-07-20 MED ORDER — DIPHENHYDRAMINE HCL 25 MG PO CAPS
50.0000 mg | ORAL_CAPSULE | Freq: Once | ORAL | Status: AC
  Administered 2024-07-20: 50 mg via ORAL
  Filled 2024-07-20: qty 2

## 2024-07-20 MED ORDER — SODIUM CHLORIDE 0.9 % IV SOLN
16.0000 mg/kg | Freq: Once | INTRAVENOUS | Status: AC
  Administered 2024-07-20: 1300 mg via INTRAVENOUS
  Filled 2024-07-20: qty 5

## 2024-07-20 MED ORDER — ACETAMINOPHEN 500 MG PO TABS
1000.0000 mg | ORAL_TABLET | Freq: Once | ORAL | Status: AC
  Administered 2024-07-20: 1000 mg via ORAL
  Filled 2024-07-20: qty 2

## 2024-07-20 MED ORDER — SODIUM CHLORIDE 0.9 % IV SOLN: Freq: Once | INTRAVENOUS | Status: AC

## 2024-07-20 NOTE — Patient Instructions (Signed)
 [facility] - A DEPT OF [facility]   Discharge Instructions: Thank you for choosing [facility] to provide your oncology and hematology care.   If you have a lab appointment with the Cancer Center, please go directly to the Cancer Center and check in at the registration area.   Wear comfortable clothing and clothing appropriate for easy access to any Portacath or PICC line.   We strive to give you quality time with your provider. You may need to reschedule your appointment if you arrive late (15 or more minutes).  Arriving late affects you and other patients whose appointments are after yours.  Also, if you miss three or more appointments without notifying the office, you may be dismissed from the clinic at the provider's discretion.      For prescription refill requests, have your pharmacy contact our office and allow 72 hours for refills to be completed.    Today you received the following chemotherapy and/or immunotherapy agents: Daratumumab (Darzalex)      To help prevent nausea and vomiting after your treatment, we encourage you to take your nausea medication as directed.  BELOW ARE SYMPTOMS THAT SHOULD BE REPORTED IMMEDIATELY: *FEVER GREATER THAN 100.4 F (38 C) OR HIGHER *CHILLS OR SWEATING *NAUSEA AND VOMITING THAT IS NOT CONTROLLED WITH YOUR NAUSEA MEDICATION *UNUSUAL SHORTNESS OF BREATH *UNUSUAL BRUISING OR BLEEDING *URINARY PROBLEMS (pain or burning when urinating, or frequent urination) *BOWEL PROBLEMS (unusual diarrhea, constipation, pain near the anus) TENDERNESS IN MOUTH AND THROAT WITH OR WITHOUT PRESENCE OF ULCERS (sore throat, sores in mouth, or a toothache) UNUSUAL RASH, SWELLING OR PAIN  UNUSUAL VAGINAL DISCHARGE OR ITCHING   Items with * indicate a potential emergency and should be followed up as soon as possible or go to the Emergency Department if any problems should occur.  Please show the CHEMOTHERAPY ALERT CARD or  IMMUNOTHERAPY ALERT CARD at check-in to the Emergency Department and triage nurse.  Should you have questions after your visit or need to cancel or reschedule your appointment, please contact [facility] - [facility] DE[facility]  Dept: [phone]  and follow the prompts.  Office hours are 8:00 a.m. to 4:30 p.m. Monday - Friday. Please note that voicemails left after 4:00 p.m. may not be returned until the following business day.  We are closed weekends and major holidays. You have access to a nurse at all times for urgent questions. Please call the main number to the clinic Dept: [phone] and follow the prompts.   For any non-urgent questions, you may also contact your provider using MyChart. We now offer e-Visits for anyone 5 and older to request care online for non-urgent symptoms. For details visit [email or web address].   Also download the MyChart app! Go to the app store, search "MyChart", open the app, select [facility], and log in with your MyChart username and password.

## 2024-07-21 ENCOUNTER — Ambulatory Visit: Payer: Self-pay

## 2024-07-21 NOTE — Telephone Encounter (Signed)
 Patient called and stated that her Albuterol  inhaler was out of medication. She states she was given this when she got out of the hospital--she believes back in September Patient had to use this inhaler yesterday and today. Patient states she might would use the inhaler twice a month since getting out of the hospital. Patient denies chest pain, fevers.   Patient states some sinus congestion, slight sinus headache, runny nose--clear---she states these have been going on for a while Patient states that she believes her allergies are causing some of this  Patient states that she had her nebulizer machine Patient states that she has a few errands to run and she is going to be staying with family so someone will be with her. She states that she is not struggling but if she is up and doing a lot she will get a little short winded--She states this can happen off and on due to her having chronic bronchitis but she states the past few days it has seemed to be a little worse. She is aware that any refills may take a few days and she said she will use her nebulizer machine if needed.   Patient is advised Urgent Care--she states that she is not in any distress at this time but she will go to Urgent Care if anything gets worse.  She just wanted to see if Dr Mahlon would refill an Albuterol  inhaler for her and send it to: [facility] on [address].     FYI Only or Action Required?: Action required by provider: clinical question for provider, update on patient condition, and patient did not want an appointment or want to go to Urgent Care at this time--she wanted to get a refill of her Albuterol  inhaler due to having to use it yesterday and today due to shortness of breath.  Patient was last seen in primary care on 06/30/2024 by Mahlon Comer BRAVO, MD.  Called Nurse Triage reporting Shortness of Breath.  Symptoms began yesterday.  Interventions attempted: Prescription medications:  Albuterol  inhaler--but this is empty now and Rest, hydration, or home remedies.  Symptoms are: patient states she is fine now but had to use her inhaler yesterday and today (when it ran out).  Triage Disposition: See HCP Within 4 Hours (Or PCP Triage)  Patient/caregiver understands and will follow disposition?: No, wishes to speak with PCP       Copied from CRM #[id number]. Topic: Clinical - Red Word Triage >> Jul 21, 2024 11:28 AM [name] wrote: Red Word that prompted transfer to Nurse Triage: Patient stated she is having a hard time breathing Patient stated she had try to puff her  albuterol  (PROVENTIL ) (2.5 MG/3ML) 0.083% nebulizer solution , but nothing coming out, states she needs a new prescription Reason for Disposition  [1] MILD difficulty breathing (e.g., minimal/no SOB at rest, SOB with walking, pulse < 100) AND [2] NEW-onset or WORSE than normal  Answer Assessment - Initial Assessment Questions Patient called and stated that her Albuterol  inhaler was out of medication. She states she was given this when she got out of the hospital--she believes back in September Patient had to use this inhaler yesterday and today. Patient states she might would use the inhaler twice a month since getting out of the hospital. Patient denies chest pain, fevers.   Patient states some sinus congestion, slight sinus headache, runny nose--clear---she states these have been going on for a while Patient states that she believes her allergies are causing  some of this  Patient states that she had her nebulizer machine Patient states that she has a few errands to run and she is going to be staying with family so someone will be with her. She states that she is not struggling but if she is up and doing a lot she will get a little short winded--She states this can happen off and on due to her having chronic bronchitis but she states the past few days it has seemed to be a little worse. She is aware that  any refills may take a few days and she said she will use her nebulizer machine if needed.   Patient is advised Urgent Care--she states that she is not in any distress at this time but she will go to Urgent Care if anything gets worse.  She just wanted to see if Dr Mahlon would refill an Albuterol  inhaler for her and send it to: [facility] on [address]. Patient was advised that the recommendation was to be seen and evaluated for her symptoms--but she states she felt okay now and just wanted the inhaler just in case she had any shortness of breath again and did not want to make an appointment at this time. She states if her PCP wants her to come in then she will do so but at this time she just wanted to ask about a refill of the Albuterol  inhaler.  Patient is advised to call us  back if anything changes or with any further questions/concerns. Patient is advised that if anything worsens to go to the Emergency Room. Patient verbalized understanding.  Protocols used: Breathing Difficulty-A-AH

## 2024-07-21 NOTE — Telephone Encounter (Signed)
 Call dropped as patient was providing her Name to triage RN. Attempted to contact patient x 1 to discuss symptoms; LVM to return call, Will attempt to contact patient at a later time to further discuss concerns.           Copied from CRM #[id number]. Topic: Clinical - Red Word Triage >> Jul 21, 2024 11:28 AM [name] wrote: [facility] that prompted transfer to Nurse Triage: Patient stated she is having a hard time breathing Patient stated she had try to puff her  albuterol  (PROVENTIL ) (2.5 MG/3ML) 0.083% nebulizer solution , but nothing coming out, states she needs a new prescription

## 2024-07-21 NOTE — Telephone Encounter (Signed)
 Spoke to patient. Informed Rheumatology referral has been redirected and the reason being that it is several months before [facility] can see her. Patient is aware and said she will call to schedule

## 2024-07-23 ENCOUNTER — Inpatient Hospital Stay

## 2024-07-23 ENCOUNTER — Other Ambulatory Visit: Payer: Self-pay

## 2024-07-23 MED ORDER — ALBUTEROL SULFATE HFA 108 (90 BASE) MCG/ACT IN AERS: 2.0000 | INHALATION_SPRAY | Freq: Four times a day (QID) | RESPIRATORY_TRACT | 0 refills | Status: DC | PRN

## 2024-07-23 MED ORDER — ALBUTEROL SULFATE HFA 108 (90 BASE) MCG/ACT IN AERS: 2.0000 | INHALATION_SPRAY | Freq: Four times a day (QID) | RESPIRATORY_TRACT | 0 refills | Status: AC | PRN

## 2024-07-23 NOTE — Telephone Encounter (Signed)
Ok to refill albuterol inhaler.

## 2024-07-23 NOTE — Telephone Encounter (Signed)
Completed and sent to pharmacy

## 2024-07-23 NOTE — Telephone Encounter (Signed)
 Okay to refill albuterol  inhaler to [facility] on [address] in [place]?

## 2024-07-27 LAB — MULTIPLE MYELOMA PANEL, SERUM
Albumin SerPl Elph-Mcnc: 3 g/dL (ref 2.9–4.4)
Albumin/Glob SerPl: 1.1 (ref 0.7–1.7)
Alpha 1: 0.2 g/dL (ref 0.0–0.4)
Alpha2 Glob SerPl Elph-Mcnc: 1.2 g/dL — ABNORMAL HIGH (ref 0.4–1.0)
B-Globulin SerPl Elph-Mcnc: 1 g/dL (ref 0.7–1.3)
Gamma Glob SerPl Elph-Mcnc: 0.3 g/dL — ABNORMAL LOW (ref 0.4–1.8)
Globulin, Total: 2.8 g/dL (ref 2.2–3.9)
IgA: 66 mg/dL (ref 64–422)
IgG (Immunoglobin G), Serum: 393 mg/dL — ABNORMAL LOW (ref 586–1602)
IgM (Immunoglobulin M), Srm: 34 mg/dL (ref 26–217)
M Protein SerPl Elph-Mcnc: 0.1 g/dL — ABNORMAL HIGH
Total Protein ELP: 5.8 g/dL — ABNORMAL LOW (ref 6.0–8.5)

## 2024-08-03 ENCOUNTER — Telehealth: Payer: Self-pay

## 2024-08-03 NOTE — Telephone Encounter (Unsigned)
 Copied from CRM [id number]. Topic: General - Other >> Aug 03, 2024  1:38 PM [name] wrote: Reason for CRM: Patient called in stated she spoke with the rheumatology and the need her last labs showing that she has a autoimmune disease needs them before they can schedule her a appointment

## 2024-08-04 NOTE — Telephone Encounter (Signed)
 Printed lab results and sent to rheumatology, called patient to inform her of this information as well

## 2024-08-05 ENCOUNTER — Encounter: Payer: Self-pay | Admitting: Family Medicine

## 2024-08-05 ENCOUNTER — Ambulatory Visit: Admitting: Family Medicine

## 2024-08-05 VITALS — BP 126/68 | HR 81 | Temp 98.3°F | Ht 60.0 in | Wt 195.0 lb

## 2024-08-05 DIAGNOSIS — J441 Chronic obstructive pulmonary disease with (acute) exacerbation: Secondary | ICD-10-CM | POA: Diagnosis not present

## 2024-08-05 MED ORDER — LORATADINE 10 MG PO TABS: 10.0000 mg | ORAL_TABLET | Freq: Every day | ORAL | 11 refills | Status: AC

## 2024-08-05 MED ORDER — PREDNISONE 10 MG PO TABS: ORAL_TABLET | ORAL | 0 refills | Status: AC

## 2024-08-05 NOTE — Patient Instructions (Signed)
 Follow up as needed or as scheduled START the Prednisone  as directed- 3 pills at the same time x3 days, then 2 pills at the same time x3 days, then 1 pill daily.  Take w/ food  USE the Albuterol  inhaler- 2 puffs as needed for cough/wheezing/shortness of breath START the Loratadine  (Claritin )- the new allergy pill CONTINUE Mucinex  to thin your congestion and help w/ cough Call with any questions or concerns Hang in there!!!

## 2024-08-05 NOTE — Progress Notes (Unsigned)
" ° °  Subjective:    Patient ID: Tracy [name], female    DOB: October 20, 1945, 79 y.o.   MRN: [id number]  HPI Cough- sxs started around Christmas.  Improved temporarily but then returned.  Having nasal congestion, watery eyes.  Denies fevers or chills.  Cough will alternate between wet and dry.  Using Albuterol  to 'loosen up the mucous' but denies SOB.  + wheezing.  No known sick contacts.  Taking Levocetirizine daily.  Reports she 'can't do' nasal sprays.  States she doesn't feel sick- just the cough and congestion.   Review of Systems For ROS see HPI     Objective:   Physical Exam Vitals reviewed.  Constitutional:      General: She is not in acute distress.    Appearance: Normal appearance. She is not ill-appearing.  HENT:     Head: Normocephalic and atraumatic.     Right Ear: Tympanic membrane and ear canal normal.     Left Ear: Tympanic membrane and ear canal normal.     Nose: Congestion present.     Comments: No TTP over frontal or maxillary sinuses    Mouth/Throat:     Mouth: Mucous membranes are moist.     Pharynx: No oropharyngeal exudate.     Comments: +PND Eyes:     Extraocular Movements: Extraocular movements intact.     Conjunctiva/sclera: Conjunctivae normal.  Cardiovascular:     Rate and Rhythm: Normal rate and regular rhythm.  Pulmonary:     Effort: No respiratory distress.     Breath sounds: Wheezing (scattered expiratory wheezes) present. No rhonchi.  Lymphadenopathy:     Cervical: No cervical adenopathy.  Skin:    General: Skin is warm and dry.  Neurological:     General: No focal deficit present.     Mental Status: She is alert and oriented to person, place, and time.  Psychiatric:        Mood and Affect: Mood normal.        Behavior: Behavior normal.        Thought Content: Thought content normal.           Assessment & Plan:  COPD exacerbation- new.  Pt w/ wheezing and cough but otherwise reports feeling ok (not sick)  Will start Prednisone  taper,  change allergy meds as she feels current Xyzal  is not effective.  Pt encouraged to pick up Albuterol  inhaler from pharmacy- she was not aware it was sent in.  Reviewed supportive care and red flags that should prompt return.  Pt expressed understanding and is in agreement w/ plan.   "

## 2024-08-10 ENCOUNTER — Other Ambulatory Visit: Payer: Self-pay | Admitting: Hematology

## 2024-08-10 DIAGNOSIS — C9000 Multiple myeloma not having achieved remission: Secondary | ICD-10-CM

## 2024-08-10 DIAGNOSIS — Z7189 Other specified counseling: Secondary | ICD-10-CM

## 2024-08-10 DIAGNOSIS — C7951 Secondary malignant neoplasm of bone: Secondary | ICD-10-CM

## 2024-08-11 ENCOUNTER — Telehealth: Payer: Self-pay

## 2024-08-11 NOTE — Telephone Encounter (Signed)
 Patients last appt with you was 08/05/24.   Copied from CRM [id number]. Topic: Clinical - Medical Advice >> Aug 11, 2024  4:24 PM Charolett L wrote: Reason for CRM: Patient is requesting prescription for incontinence supplies. She stated that she's having urinary leakage and needs some support   CB# [phone]

## 2024-08-11 NOTE — Telephone Encounter (Signed)
 I'm fine to help her with these supplies but does she have a specific idea of what she is hoping to use?  Depends?  And does she have a specific medical supply store that she wants to use that we can send a prescription?

## 2024-08-12 NOTE — Telephone Encounter (Signed)
 Called and spoke with patient she stated that she would need the depends pull ups. She stated that she is using 2-3 times a day. She would like to have this sent [facility] in [place].

## 2024-08-16 ENCOUNTER — Encounter: Payer: Self-pay | Admitting: Physical Medicine and Rehabilitation

## 2024-08-19 MED FILL — Dexamethasone Sodium Phosphate Inj 100 MG/10ML: INTRAMUSCULAR | Qty: 1.2 | Status: AC

## 2024-08-20 ENCOUNTER — Inpatient Hospital Stay

## 2024-08-20 ENCOUNTER — Inpatient Hospital Stay: Attending: Hematology

## 2024-08-20 VITALS — BP 145/69 | HR 66 | Temp 97.4°F | Resp 18 | Wt 191.0 lb

## 2024-08-20 DIAGNOSIS — E1122 Type 2 diabetes mellitus with diabetic chronic kidney disease: Secondary | ICD-10-CM | POA: Diagnosis not present

## 2024-08-20 DIAGNOSIS — Z7189 Other specified counseling: Secondary | ICD-10-CM

## 2024-08-20 DIAGNOSIS — N189 Chronic kidney disease, unspecified: Secondary | ICD-10-CM | POA: Diagnosis not present

## 2024-08-20 DIAGNOSIS — I2510 Atherosclerotic heart disease of native coronary artery without angina pectoris: Secondary | ICD-10-CM | POA: Insufficient documentation

## 2024-08-20 DIAGNOSIS — I129 Hypertensive chronic kidney disease with stage 1 through stage 4 chronic kidney disease, or unspecified chronic kidney disease: Secondary | ICD-10-CM | POA: Diagnosis not present

## 2024-08-20 DIAGNOSIS — C9000 Multiple myeloma not having achieved remission: Secondary | ICD-10-CM | POA: Diagnosis present

## 2024-08-20 DIAGNOSIS — E1140 Type 2 diabetes mellitus with diabetic neuropathy, unspecified: Secondary | ICD-10-CM | POA: Insufficient documentation

## 2024-08-20 DIAGNOSIS — E538 Deficiency of other specified B group vitamins: Secondary | ICD-10-CM | POA: Insufficient documentation

## 2024-08-20 DIAGNOSIS — C7951 Secondary malignant neoplasm of bone: Secondary | ICD-10-CM

## 2024-08-20 DIAGNOSIS — I252 Old myocardial infarction: Secondary | ICD-10-CM | POA: Diagnosis not present

## 2024-08-20 LAB — CMP (CANCER CENTER ONLY)
ALT: 13 U/L (ref 0–44)
AST: 17 U/L (ref 15–41)
Albumin: 4.2 g/dL (ref 3.5–5.0)
Alkaline Phosphatase: 76 U/L (ref 38–126)
Anion gap: 15 (ref 5–15)
BUN: 11 mg/dL (ref 8–23)
CO2: 23 mmol/L (ref 22–32)
Calcium: 9.1 mg/dL (ref 8.9–10.3)
Chloride: 107 mmol/L (ref 98–111)
Creatinine: 1.32 mg/dL — ABNORMAL HIGH (ref 0.44–1.00)
GFR, Estimated: 41 mL/min — ABNORMAL LOW
Glucose, Bld: 131 mg/dL — ABNORMAL HIGH (ref 70–99)
Potassium: 3.8 mmol/L (ref 3.5–5.1)
Sodium: 145 mmol/L (ref 135–145)
Total Bilirubin: 0.3 mg/dL (ref 0.0–1.2)
Total Protein: 6.6 g/dL (ref 6.5–8.1)

## 2024-08-20 LAB — CBC WITH DIFFERENTIAL (CANCER CENTER ONLY)
Abs Immature Granulocytes: 0.01 K/uL (ref 0.00–0.07)
Basophils Absolute: 0 K/uL (ref 0.0–0.1)
Basophils Relative: 1 %
Eosinophils Absolute: 0.1 K/uL (ref 0.0–0.5)
Eosinophils Relative: 1 %
HCT: 30.3 % — ABNORMAL LOW (ref 36.0–46.0)
Hemoglobin: 10.3 g/dL — ABNORMAL LOW (ref 12.0–15.0)
Immature Granulocytes: 0 %
Lymphocytes Relative: 34 %
Lymphs Abs: 1.9 K/uL (ref 0.7–4.0)
MCH: 31.2 pg (ref 26.0–34.0)
MCHC: 34 g/dL (ref 30.0–36.0)
MCV: 91.8 fL (ref 80.0–100.0)
Monocytes Absolute: 0.3 K/uL (ref 0.1–1.0)
Monocytes Relative: 6 %
Neutro Abs: 3.2 K/uL (ref 1.7–7.7)
Neutrophils Relative %: 58 %
Platelet Count: 236 K/uL (ref 150–400)
RBC: 3.3 MIL/uL — ABNORMAL LOW (ref 3.87–5.11)
RDW: 13.7 % (ref 11.5–15.5)
WBC Count: 5.5 K/uL (ref 4.0–10.5)
nRBC: 0 % (ref 0.0–0.2)

## 2024-08-20 MED ORDER — FAMOTIDINE IN NACL 20-0.9 MG/50ML-% IV SOLN
20.0000 mg | Freq: Once | INTRAVENOUS | Status: AC
  Administered 2024-08-20: 20 mg via INTRAVENOUS
  Filled 2024-08-20: qty 50

## 2024-08-20 MED ORDER — SODIUM CHLORIDE 0.9 % IV SOLN: Freq: Once | INTRAVENOUS | Status: AC

## 2024-08-20 MED ORDER — SODIUM CHLORIDE 0.9 % IV SOLN
16.0000 mg/kg | Freq: Once | INTRAVENOUS | Status: AC
  Administered 2024-08-20: 1300 mg via INTRAVENOUS
  Filled 2024-08-20: qty 5

## 2024-08-20 MED ORDER — ACETAMINOPHEN 500 MG PO TABS
1000.0000 mg | ORAL_TABLET | Freq: Once | ORAL | Status: AC
  Administered 2024-08-20: 1000 mg via ORAL
  Filled 2024-08-20: qty 2

## 2024-08-20 MED ORDER — SODIUM CHLORIDE 0.9 % IV SOLN
12.0000 mg | Freq: Once | INTRAVENOUS | Status: AC
  Administered 2024-08-20: 12 mg via INTRAVENOUS
  Filled 2024-08-20: qty 1.2

## 2024-08-20 MED ORDER — CYANOCOBALAMIN 1000 MCG/ML IJ SOLN
1000.0000 ug | Freq: Once | INTRAMUSCULAR | Status: AC
  Administered 2024-08-20: 1000 ug via INTRAMUSCULAR
  Filled 2024-08-20: qty 1

## 2024-08-20 MED ORDER — DIPHENHYDRAMINE HCL 25 MG PO CAPS
50.0000 mg | ORAL_CAPSULE | Freq: Once | ORAL | Status: AC
  Administered 2024-08-20: 50 mg via ORAL
  Filled 2024-08-20: qty 2

## 2024-08-20 NOTE — Patient Instructions (Signed)
 [facility] - A DEPT OF [facility]   Discharge Instructions: Thank you for choosing [facility] to provide your oncology and hematology care.   If you have a lab appointment with the Cancer Center, please go directly to the Cancer Center and check in at the registration area.   Wear comfortable clothing and clothing appropriate for easy access to any Portacath or PICC line.   We strive to give you quality time with your provider. You may need to reschedule your appointment if you arrive late (15 or more minutes).  Arriving late affects you and other patients whose appointments are after yours.  Also, if you miss three or more appointments without notifying the office, you may be dismissed from the clinic at the provider's discretion.      For prescription refill requests, have your pharmacy contact our office and allow 72 hours for refills to be completed.    Today you received the following chemotherapy and/or immunotherapy agents: Daratumumab (Darzalex)      To help prevent nausea and vomiting after your treatment, we encourage you to take your nausea medication as directed.  BELOW ARE SYMPTOMS THAT SHOULD BE REPORTED IMMEDIATELY: *FEVER GREATER THAN 100.4 F (38 C) OR HIGHER *CHILLS OR SWEATING *NAUSEA AND VOMITING THAT IS NOT CONTROLLED WITH YOUR NAUSEA MEDICATION *UNUSUAL SHORTNESS OF BREATH *UNUSUAL BRUISING OR BLEEDING *URINARY PROBLEMS (pain or burning when urinating, or frequent urination) *BOWEL PROBLEMS (unusual diarrhea, constipation, pain near the anus) TENDERNESS IN MOUTH AND THROAT WITH OR WITHOUT PRESENCE OF ULCERS (sore throat, sores in mouth, or a toothache) UNUSUAL RASH, SWELLING OR PAIN  UNUSUAL VAGINAL DISCHARGE OR ITCHING   Items with * indicate a potential emergency and should be followed up as soon as possible or go to the Emergency Department if any problems should occur.  Please show the CHEMOTHERAPY ALERT CARD or  IMMUNOTHERAPY ALERT CARD at check-in to the Emergency Department and triage nurse.  Should you have questions after your visit or need to cancel or reschedule your appointment, please contact [facility] - [facility] DE[facility]  Dept: [phone]  and follow the prompts.  Office hours are 8:00 a.m. to 4:30 p.m. Monday - Friday. Please note that voicemails left after 4:00 p.m. may not be returned until the following business day.  We are closed weekends and major holidays. You have access to a nurse at all times for urgent questions. Please call the main number to the clinic Dept: [phone] and follow the prompts.   For any non-urgent questions, you may also contact your provider using MyChart. We now offer e-Visits for anyone 5 and older to request care online for non-urgent symptoms. For details visit [email or web address].   Also download the MyChart app! Go to the app store, search "MyChart", open the app, select [facility], and log in with your MyChart username and password.

## 2024-08-25 ENCOUNTER — Other Ambulatory Visit: Payer: Self-pay

## 2024-08-25 DIAGNOSIS — C9000 Multiple myeloma not having achieved remission: Secondary | ICD-10-CM

## 2024-08-25 LAB — MULTIPLE MYELOMA PANEL, SERUM
Albumin SerPl Elph-Mcnc: 3.2 g/dL (ref 2.9–4.4)
Albumin/Glob SerPl: 1.2 (ref 0.7–1.7)
Alpha 1: 0.2 g/dL (ref 0.0–0.4)
Alpha2 Glob SerPl Elph-Mcnc: 1.3 g/dL — ABNORMAL HIGH (ref 0.4–1.0)
B-Globulin SerPl Elph-Mcnc: 1.1 g/dL (ref 0.7–1.3)
Gamma Glob SerPl Elph-Mcnc: 0.4 g/dL (ref 0.4–1.8)
Globulin, Total: 2.9 g/dL (ref 2.2–3.9)
IgA: 67 mg/dL (ref 64–422)
IgG (Immunoglobin G), Serum: 404 mg/dL — ABNORMAL LOW (ref 586–1602)
IgM (Immunoglobulin M), Srm: 38 mg/dL (ref 26–217)
M Protein SerPl Elph-Mcnc: 0.1 g/dL — ABNORMAL HIGH
Total Protein ELP: 6.1 g/dL (ref 6.0–8.5)

## 2024-08-27 ENCOUNTER — Encounter: Payer: Self-pay | Admitting: Hematology

## 2024-08-27 ENCOUNTER — Inpatient Hospital Stay

## 2024-08-27 VITALS — BP 153/71 | HR 78 | Temp 98.8°F | Resp 18 | Wt 189.0 lb

## 2024-08-27 DIAGNOSIS — C9000 Multiple myeloma not having achieved remission: Secondary | ICD-10-CM

## 2024-08-27 DIAGNOSIS — C7951 Secondary malignant neoplasm of bone: Secondary | ICD-10-CM

## 2024-08-27 LAB — CMP (CANCER CENTER ONLY)
ALT: 11 U/L (ref 0–44)
AST: 16 U/L (ref 15–41)
Albumin: 4.1 g/dL (ref 3.5–5.0)
Alkaline Phosphatase: 77 U/L (ref 38–126)
Anion gap: 16 — ABNORMAL HIGH (ref 5–15)
BUN: 8 mg/dL (ref 8–23)
CO2: 22 mmol/L (ref 22–32)
Calcium: 9 mg/dL (ref 8.9–10.3)
Chloride: 106 mmol/L (ref 98–111)
Creatinine: 1.21 mg/dL — ABNORMAL HIGH (ref 0.44–1.00)
GFR, Estimated: 45 mL/min — ABNORMAL LOW
Glucose, Bld: 173 mg/dL — ABNORMAL HIGH (ref 70–99)
Potassium: 4 mmol/L (ref 3.5–5.1)
Sodium: 143 mmol/L (ref 135–145)
Total Bilirubin: 0.3 mg/dL (ref 0.0–1.2)
Total Protein: 6.4 g/dL — ABNORMAL LOW (ref 6.5–8.1)

## 2024-08-27 LAB — CBC WITH DIFFERENTIAL (CANCER CENTER ONLY)
Abs Immature Granulocytes: 0.02 10*3/uL (ref 0.00–0.07)
Basophils Absolute: 0 10*3/uL (ref 0.0–0.1)
Basophils Relative: 1 %
Eosinophils Absolute: 0 10*3/uL (ref 0.0–0.5)
Eosinophils Relative: 1 %
HCT: 30.2 % — ABNORMAL LOW (ref 36.0–46.0)
Hemoglobin: 10.2 g/dL — ABNORMAL LOW (ref 12.0–15.0)
Immature Granulocytes: 0 %
Lymphocytes Relative: 33 %
Lymphs Abs: 1.9 10*3/uL (ref 0.7–4.0)
MCH: 31.5 pg (ref 26.0–34.0)
MCHC: 33.8 g/dL (ref 30.0–36.0)
MCV: 93.2 fL (ref 80.0–100.0)
Monocytes Absolute: 0.3 10*3/uL (ref 0.1–1.0)
Monocytes Relative: 6 %
Neutro Abs: 3.5 10*3/uL (ref 1.7–7.7)
Neutrophils Relative %: 59 %
Platelet Count: 252 10*3/uL (ref 150–400)
RBC: 3.24 MIL/uL — ABNORMAL LOW (ref 3.87–5.11)
RDW: 13.7 % (ref 11.5–15.5)
WBC Count: 5.8 10*3/uL (ref 4.0–10.5)
nRBC: 0 % (ref 0.0–0.2)

## 2024-08-27 MED ORDER — SODIUM CHLORIDE 0.9 % IV SOLN
60.0000 mg | Freq: Once | INTRAVENOUS | Status: DC
  Filled 2024-08-27: qty 10

## 2024-08-27 MED ORDER — SODIUM CHLORIDE 0.9 % IV SOLN: INTRAVENOUS | Status: DC

## 2024-08-27 NOTE — Progress Notes (Signed)
 Patient reports tooth pain x 1 month L upper & L lower side, she has not seen her dentist.  Per Dr Onesimo, arredia to be held until patient sees her dentist.  Patient informed, she verbalizes understanding.

## 2024-08-31 NOTE — Telephone Encounter (Signed)
 Called patient and she is aware that rx was faxed. She will wait for their call

## 2024-08-31 NOTE — Telephone Encounter (Signed)
 Faxed rx to [facility] medication and scanned to scanned box

## 2024-09-03 ENCOUNTER — Other Ambulatory Visit: Payer: Self-pay | Admitting: Physician Assistant

## 2024-09-03 DIAGNOSIS — Z7189 Other specified counseling: Secondary | ICD-10-CM

## 2024-09-03 DIAGNOSIS — C7951 Secondary malignant neoplasm of bone: Secondary | ICD-10-CM

## 2024-09-03 DIAGNOSIS — C9000 Multiple myeloma not having achieved remission: Secondary | ICD-10-CM

## 2024-09-14 ENCOUNTER — Inpatient Hospital Stay

## 2024-09-14 ENCOUNTER — Inpatient Hospital Stay: Attending: Hematology

## 2024-09-14 ENCOUNTER — Inpatient Hospital Stay: Admitting: Hematology

## 2024-10-12 ENCOUNTER — Inpatient Hospital Stay

## 2024-10-12 ENCOUNTER — Inpatient Hospital Stay: Attending: Hematology

## 2024-10-15 ENCOUNTER — Inpatient Hospital Stay

## 2024-10-22 ENCOUNTER — Inpatient Hospital Stay

## 2025-01-17 ENCOUNTER — Ambulatory Visit: Admitting: Diagnostic Neuroimaging
